# Patient Record
Sex: Female | Born: 1950 | Race: Black or African American | Hispanic: No | Marital: Single | Smoking: Former smoker
Health system: Southern US, Community
[De-identification: ages and names within clinical notes are randomized; demographics above are authoritative.]

## PROBLEM LIST (undated history)

## (undated) DIAGNOSIS — Z8719 Personal history of other diseases of the digestive system: Secondary | ICD-10-CM

## (undated) DIAGNOSIS — G4730 Sleep apnea, unspecified: Secondary | ICD-10-CM

## (undated) DIAGNOSIS — G43909 Migraine, unspecified, not intractable, without status migrainosus: Secondary | ICD-10-CM

## (undated) DIAGNOSIS — I2510 Atherosclerotic heart disease of native coronary artery without angina pectoris: Secondary | ICD-10-CM

## (undated) DIAGNOSIS — M1990 Unspecified osteoarthritis, unspecified site: Secondary | ICD-10-CM

## (undated) DIAGNOSIS — F419 Anxiety disorder, unspecified: Secondary | ICD-10-CM

## (undated) DIAGNOSIS — K635 Polyp of colon: Secondary | ICD-10-CM

## (undated) DIAGNOSIS — T7840XA Allergy, unspecified, initial encounter: Secondary | ICD-10-CM

## (undated) DIAGNOSIS — K219 Gastro-esophageal reflux disease without esophagitis: Secondary | ICD-10-CM

## (undated) DIAGNOSIS — M109 Gout, unspecified: Secondary | ICD-10-CM

## (undated) DIAGNOSIS — I776 Arteritis, unspecified: Secondary | ICD-10-CM

## (undated) DIAGNOSIS — E669 Obesity, unspecified: Secondary | ICD-10-CM

## (undated) DIAGNOSIS — Z9989 Dependence on other enabling machines and devices: Secondary | ICD-10-CM

## (undated) DIAGNOSIS — G4733 Obstructive sleep apnea (adult) (pediatric): Secondary | ICD-10-CM

## (undated) DIAGNOSIS — I10 Essential (primary) hypertension: Secondary | ICD-10-CM

## (undated) DIAGNOSIS — D649 Anemia, unspecified: Secondary | ICD-10-CM

## (undated) DIAGNOSIS — R7303 Prediabetes: Secondary | ICD-10-CM

## (undated) HISTORY — PX: APPENDECTOMY: SHX54

## (undated) HISTORY — PX: ABDOMINAL HYSTERECTOMY: SHX81

## (undated) HISTORY — PX: CARDIAC CATHETERIZATION: SHX172

## (undated) HISTORY — DX: Sleep apnea, unspecified: G47.30

## (undated) HISTORY — DX: Allergy, unspecified, initial encounter: T78.40XA

## (undated) HISTORY — DX: Polyp of colon: K63.5

## (undated) HISTORY — PX: TONSILLECTOMY: SUR1361

---

## 2003-04-27 DIAGNOSIS — F419 Anxiety disorder, unspecified: Secondary | ICD-10-CM | POA: Insufficient documentation

## 2008-08-22 DIAGNOSIS — D2110 Benign neoplasm of connective and other soft tissue of unspecified upper limb, including shoulder: Secondary | ICD-10-CM | POA: Insufficient documentation

## 2008-08-22 DIAGNOSIS — Z01810 Encounter for preprocedural cardiovascular examination: Secondary | ICD-10-CM

## 2008-08-22 HISTORY — DX: Encounter for preprocedural cardiovascular examination: Z01.810

## 2009-10-16 DIAGNOSIS — M1990 Unspecified osteoarthritis, unspecified site: Secondary | ICD-10-CM | POA: Insufficient documentation

## 2011-07-26 DIAGNOSIS — I10 Essential (primary) hypertension: Secondary | ICD-10-CM | POA: Insufficient documentation

## 2011-07-26 DIAGNOSIS — K2100 Gastro-esophageal reflux disease with esophagitis, without bleeding: Secondary | ICD-10-CM | POA: Insufficient documentation

## 2013-04-26 HISTORY — PX: CARDIAC CATHETERIZATION: SHX172

## 2016-05-05 ENCOUNTER — Observation Stay (HOSPITAL_COMMUNITY)
Admission: EM | Admit: 2016-05-05 | Discharge: 2016-05-07 | DRG: 342 | Disposition: A | Discharge status: Home / Self Care | Payer: BLUE CROSS/BLUE SHIELD | Attending: Surgery | Admitting: Surgery

## 2016-05-05 ENCOUNTER — Encounter (HOSPITAL_COMMUNITY): Payer: Self-pay | Admitting: *Deleted

## 2016-05-05 DIAGNOSIS — K3580 Unspecified acute appendicitis: Secondary | ICD-10-CM

## 2016-05-05 DIAGNOSIS — Z882 Allergy status to sulfonamides status: Secondary | ICD-10-CM | POA: Diagnosis not present

## 2016-05-05 DIAGNOSIS — K352 Acute appendicitis with generalized peritonitis: Principal | ICD-10-CM | POA: Insufficient documentation

## 2016-05-05 DIAGNOSIS — Z791 Long term (current) use of non-steroidal anti-inflammatories (NSAID): Secondary | ICD-10-CM | POA: Insufficient documentation

## 2016-05-05 DIAGNOSIS — R011 Cardiac murmur, unspecified: Secondary | ICD-10-CM | POA: Diagnosis not present

## 2016-05-05 DIAGNOSIS — Z6841 Body Mass Index (BMI) 40.0 and over, adult: Secondary | ICD-10-CM | POA: Diagnosis not present

## 2016-05-05 DIAGNOSIS — I10 Essential (primary) hypertension: Secondary | ICD-10-CM | POA: Insufficient documentation

## 2016-05-05 DIAGNOSIS — M109 Gout, unspecified: Secondary | ICD-10-CM | POA: Diagnosis not present

## 2016-05-05 DIAGNOSIS — Z79899 Other long term (current) drug therapy: Secondary | ICD-10-CM | POA: Diagnosis not present

## 2016-05-05 DIAGNOSIS — R109 Unspecified abdominal pain: Secondary | ICD-10-CM | POA: Diagnosis present

## 2016-05-05 DIAGNOSIS — K219 Gastro-esophageal reflux disease without esophagitis: Secondary | ICD-10-CM | POA: Insufficient documentation

## 2016-05-05 DIAGNOSIS — Z90710 Acquired absence of both cervix and uterus: Secondary | ICD-10-CM | POA: Diagnosis not present

## 2016-05-05 HISTORY — DX: Obesity, unspecified: E66.9

## 2016-05-05 HISTORY — DX: Gastro-esophageal reflux disease without esophagitis: K21.9

## 2016-05-05 HISTORY — DX: Personal history of other diseases of the digestive system: Z87.19

## 2016-05-05 HISTORY — DX: Obstructive sleep apnea (adult) (pediatric): G47.33

## 2016-05-05 HISTORY — DX: Gout, unspecified: M10.9

## 2016-05-05 HISTORY — DX: Migraine, unspecified, not intractable, without status migrainosus: G43.909

## 2016-05-05 HISTORY — DX: Essential (primary) hypertension: I10

## 2016-05-05 HISTORY — DX: Anxiety disorder, unspecified: F41.9

## 2016-05-05 HISTORY — DX: Unspecified osteoarthritis, unspecified site: M19.90

## 2016-05-05 HISTORY — DX: Dependence on other enabling machines and devices: Z99.89

## 2016-05-05 LAB — CBC
HCT: 34.7 % — ABNORMAL LOW (ref 36.0–46.0)
HEMOGLOBIN: 11.3 g/dL — AB (ref 12.0–15.0)
MCH: 27.4 pg (ref 26.0–34.0)
MCHC: 32.6 g/dL (ref 30.0–36.0)
MCV: 84.2 fL (ref 78.0–100.0)
Platelets: 298 10*3/uL (ref 150–400)
RBC: 4.12 MIL/uL (ref 3.87–5.11)
RDW: 15.4 % (ref 11.5–15.5)
WBC: 10.5 10*3/uL (ref 4.0–10.5)

## 2016-05-05 LAB — COMPREHENSIVE METABOLIC PANEL
ALBUMIN: 4 g/dL (ref 3.5–5.0)
ALK PHOS: 81 U/L (ref 38–126)
ALT: 18 U/L (ref 14–54)
ANION GAP: 11 (ref 5–15)
AST: 21 U/L (ref 15–41)
BUN: 22 mg/dL — ABNORMAL HIGH (ref 6–20)
CALCIUM: 10.1 mg/dL (ref 8.9–10.3)
CO2: 23 mmol/L (ref 22–32)
Chloride: 104 mmol/L (ref 101–111)
Creatinine, Ser: 0.87 mg/dL (ref 0.44–1.00)
GFR calc Af Amer: >60 mL/min (ref >60)
GFR calc non Af Amer: >60 mL/min (ref >60)
GLUCOSE: 100 mg/dL — AB (ref 65–99)
Potassium: 4 mmol/L (ref 3.5–5.1)
SODIUM: 138 mmol/L (ref 135–145)
Total Bilirubin: 0.5 mg/dL (ref 0.3–1.2)
Total Protein: 8.4 g/dL — ABNORMAL HIGH (ref 6.5–8.1)

## 2016-05-05 LAB — LIPASE, BLOOD: Lipase: 16 U/L (ref 11–51)

## 2016-05-05 LAB — URINALYSIS, ROUTINE W REFLEX MICROSCOPIC
BILIRUBIN URINE: NEGATIVE
Glucose, UA: NEGATIVE mg/dL
HGB URINE DIPSTICK: NEGATIVE
Ketones, ur: NEGATIVE mg/dL
Leukocytes, UA: NEGATIVE
Nitrite: NEGATIVE
PH: 6 (ref 5.0–8.0)
Protein, ur: NEGATIVE mg/dL
SPECIFIC GRAVITY, URINE: 1.015 (ref 1.005–1.030)

## 2016-05-05 MED ORDER — IOPAMIDOL (ISOVUE-300) INJECTION 61%
INTRAVENOUS | Status: AC
  Administered 2016-05-06: 100 mL
  Filled 2016-05-05: qty 100

## 2016-05-05 MED ORDER — FENTANYL CITRATE (PF) 100 MCG/2ML IJ SOLN
50.0000 ug | INTRAMUSCULAR | Status: DC | PRN
  Administered 2016-05-06: 50 ug via INTRAVENOUS
  Filled 2016-05-05: qty 2

## 2016-05-05 NOTE — ED Triage Notes (Signed)
Pt reports onset today of RLQ pain with nausea. No bm today and denies fever. Sent here to r/o appendicitis.

## 2016-05-05 NOTE — ED Provider Notes (Signed)
[facility] DEPT Provider Note   CSN: [id number] Arrival date & time: 05/05/16  1437     History   Chief Complaint Chief Complaint  Patient presents with  . Abdominal Pain    HPI Yaqueline Kurihara is a 66 y.o. female.  The history is provided by the patient.  Abdominal Pain   This is a new problem. The current episode started 6 to 12 hours ago (Sudden onset this morning). The problem occurs constantly. Progression since onset: Has migrated from both sides of the abdomen to mostly the right lower and upper quadrants. The pain is associated with an unknown factor. The pain is located in the RUQ, LUQ and LLQ. The quality of the pain is aching. The pain is moderate. Pertinent negatives include anorexia, fever, hematochezia, melena, vomiting and hematuria. Nothing aggravates the symptoms. Nothing relieves the symptoms.    Past Medical History:  Diagnosis Date  . GERD (gastroesophageal reflux disease)   . Gout   . Hypertension   . Obesity     There are no active problems to display for this patient.   Past Surgical History:  Procedure Laterality Date  . ABDOMINAL HYSTERECTOMY      OB History    No data available       Home Medications    Prior to Admission medications   Not on File    Family History History reviewed. No pertinent family history.  Social History Social History  Substance Use Topics  . Smoking status: Never Smoker  . Smokeless tobacco: Not on file  . Alcohol use No     Allergies   Sulfa antibiotics   Review of Systems Review of Systems  Constitutional: Negative for fever.  Gastrointestinal: Positive for abdominal pain. Negative for anorexia, hematochezia, melena and vomiting.  Genitourinary: Negative for hematuria.  All other systems reviewed and are negative.    Physical Exam Updated Vital Signs BP 145/68 (BP Location: Left Arm)   Pulse 91   Temp 98.8 F (37.1 C) (Oral)   Resp 16   Ht 5\' 1"  (1.549 m)   Wt 297 lb (134.7 kg)    SpO2 100%   BMI 56.12 kg/m   Physical Exam  Constitutional: She is oriented to person, place, and time. She appears well-developed and well-nourished. No distress.  HENT:  Head: Normocephalic.  Nose: Nose normal.  Eyes: Conjunctivae are normal.  Neck: Neck supple. No tracheal deviation present.  Cardiovascular: Normal rate, regular rhythm and normal heart sounds.   Pulmonary/Chest: Effort normal and breath sounds normal. No respiratory distress.  Abdominal: Soft. She exhibits no distension. There is tenderness (mild RUQ and RLQ). There is guarding (voluntary, right side ).  Obese abdomen  Neurological: She is alert and oriented to person, place, and time.  Skin: Skin is warm and dry. No rash noted.  Psychiatric: She has a normal mood and affect.  Vitals reviewed.    ED Treatments / Results  Labs (all labs ordered are listed, but only abnormal results are displayed) Labs Reviewed  COMPREHENSIVE METABOLIC PANEL - Abnormal; Notable for the following:       Result Value   Glucose, Bld 100 (*)    BUN 22 (*)    Total Protein 8.4 (*)    All other components within normal limits  CBC - Abnormal; Notable for the following:    Hemoglobin 11.3 (*)    HCT 34.7 (*)    All other components within normal limits  LIPASE, BLOOD  URINALYSIS, ROUTINE W  REFLEX MICROSCOPIC    EKG  EKG Interpretation None       Radiology No results found.  Procedures Procedures (including critical care time)  Medications Ordered in ED Medications  fentaNYL (SUBLIMAZE) injection 50 mcg (not administered)     Initial Impression / Assessment and Plan / ED Course  I have reviewed the triage vital signs and the nursing notes.  Pertinent labs & imaging results that were available during my care of the patient were reviewed by me and considered in my medical decision making (see chart for details).  Clinical Course     66 y.o. female presents with Bilateral lower abdominal pain that has  migrated into her right upper quadrant and continues on her right flank. No vomiting, no fever, no diarrhea, has not have bowel movement today and bowel movements yesterday and prior were normal. Labs reassuring, exam concerning for very large body habitus and difficult exam, diffuse right-sided tenderness that could be due to early appendicitis or other surgical pathology. I have a low suspicion clinically but feel that CT is warranted for adequate rule out given the limitations of her exam.   Care transferred to Dr Venora Maples at midnight pending results of study to r/o emergent pathology, recommended NSAIDs and tylenol PRN at home if unremarkable or will disposition accordingly if concerning findings on scan.   Final Clinical Impressions(s) / ED Diagnoses   Final diagnoses:  Right sided abdominal pain    New Prescriptions New Prescriptions   No medications on file     Leo Grosser, MD 05/05/16 2341

## 2016-05-06 ENCOUNTER — Encounter (HOSPITAL_COMMUNITY)
Admission: EM | Disposition: A | Discharge status: Home / Self Care | Payer: Self-pay | Source: Home / Self Care | Attending: Emergency Medicine

## 2016-05-06 ENCOUNTER — Inpatient Hospital Stay (HOSPITAL_COMMUNITY): Payer: BLUE CROSS/BLUE SHIELD | Admitting: Anesthesiology

## 2016-05-06 ENCOUNTER — Encounter (HOSPITAL_COMMUNITY): Payer: Self-pay | Admitting: Radiology

## 2016-05-06 ENCOUNTER — Emergency Department (HOSPITAL_COMMUNITY): Payer: BLUE CROSS/BLUE SHIELD

## 2016-05-06 DIAGNOSIS — K352 Acute appendicitis with generalized peritonitis: Secondary | ICD-10-CM | POA: Diagnosis not present

## 2016-05-06 DIAGNOSIS — Z6841 Body Mass Index (BMI) 40.0 and over, adult: Secondary | ICD-10-CM | POA: Diagnosis not present

## 2016-05-06 DIAGNOSIS — I10 Essential (primary) hypertension: Secondary | ICD-10-CM | POA: Diagnosis not present

## 2016-05-06 DIAGNOSIS — K3580 Unspecified acute appendicitis: Secondary | ICD-10-CM | POA: Diagnosis present

## 2016-05-06 HISTORY — PX: LAPAROSCOPIC APPENDECTOMY: SHX408

## 2016-05-06 LAB — CREATININE, SERUM
CREATININE: 1.02 mg/dL — AB (ref 0.44–1.00)
GFR calc Af Amer: >60 mL/min (ref >60)
GFR calc non Af Amer: 57 mL/min — ABNORMAL LOW (ref >60)

## 2016-05-06 LAB — CBC
HCT: 32.1 % — ABNORMAL LOW (ref 36.0–46.0)
Hemoglobin: 10.2 g/dL — ABNORMAL LOW (ref 12.0–15.0)
MCH: 26.9 pg (ref 26.0–34.0)
MCHC: 31.8 g/dL (ref 30.0–36.0)
MCV: 84.7 fL (ref 78.0–100.0)
PLATELETS: 267 10*3/uL (ref 150–400)
RBC: 3.79 MIL/uL — AB (ref 3.87–5.11)
RDW: 15.8 % — AB (ref 11.5–15.5)
WBC: 10.1 10*3/uL (ref 4.0–10.5)

## 2016-05-06 SURGERY — APPENDECTOMY, LAPAROSCOPIC
Anesthesia: General | Site: Abdomen

## 2016-05-06 MED ORDER — ALLOPURINOL 100 MG PO TABS
100.0000 mg | ORAL_TABLET | Freq: Every day | ORAL | Status: DC
  Administered 2016-05-06 – 2016-05-07 (×2): 100 mg via ORAL
  Filled 2016-05-06 (×2): qty 1

## 2016-05-06 MED ORDER — FENTANYL CITRATE (PF) 100 MCG/2ML IJ SOLN
INTRAMUSCULAR | Status: AC
  Filled 2016-05-06: qty 2

## 2016-05-06 MED ORDER — DIPHENHYDRAMINE HCL 50 MG/ML IJ SOLN
12.5000 mg | Freq: Four times a day (QID) | INTRAMUSCULAR | Status: DC | PRN
  Administered 2016-05-07: 12.5 mg via INTRAVENOUS
  Filled 2016-05-06: qty 1

## 2016-05-06 MED ORDER — MIDAZOLAM HCL 2 MG/2ML IJ SOLN
INTRAMUSCULAR | Status: AC
  Filled 2016-05-06: qty 2

## 2016-05-06 MED ORDER — ROCURONIUM BROMIDE 100 MG/10ML IV SOLN
INTRAVENOUS | Status: DC | PRN
Start: 2016-05-06 — End: 2016-05-06
  Administered 2016-05-06: 50 mg via INTRAVENOUS

## 2016-05-06 MED ORDER — DIPHENHYDRAMINE HCL 50 MG/ML IJ SOLN: 12.5000 mg | Freq: Four times a day (QID) | INTRAMUSCULAR | Status: DC | PRN

## 2016-05-06 MED ORDER — BUPIVACAINE HCL (PF) 0.25 % IJ SOLN
INTRAMUSCULAR | Status: AC
  Filled 2016-05-06: qty 30

## 2016-05-06 MED ORDER — SODIUM CHLORIDE 0.9 % IR SOLN
Status: DC | PRN
  Administered 2016-05-06: 1000 mL

## 2016-05-06 MED ORDER — ONDANSETRON HCL 4 MG/2ML IJ SOLN
4.0000 mg | Freq: Four times a day (QID) | INTRAMUSCULAR | Status: DC | PRN
Start: 2016-05-06 — End: 2016-05-07

## 2016-05-06 MED ORDER — HYDRALAZINE HCL 20 MG/ML IJ SOLN: 10.0000 mg | INTRAMUSCULAR | Status: DC | PRN

## 2016-05-06 MED ORDER — DEXTROSE 5 % IV SOLN
1.0000 g | Freq: Once | INTRAVENOUS | Status: AC
  Administered 2016-05-06: 1 g via INTRAVENOUS
  Filled 2016-05-06: qty 10

## 2016-05-06 MED ORDER — FENTANYL CITRATE (PF) 100 MCG/2ML IJ SOLN
INTRAMUSCULAR | Status: DC | PRN
  Administered 2016-05-06: 100 ug via INTRAVENOUS
  Administered 2016-05-06 (×2): 50 ug via INTRAVENOUS

## 2016-05-06 MED ORDER — ONDANSETRON HCL 4 MG/2ML IJ SOLN
4.0000 mg | Freq: Three times a day (TID) | INTRAMUSCULAR | Status: DC | PRN
  Administered 2016-05-06: 4 mg via INTRAVENOUS
  Filled 2016-05-06: qty 2

## 2016-05-06 MED ORDER — DULOXETINE HCL 30 MG PO CPEP
30.0000 mg | ORAL_CAPSULE | Freq: Every day | ORAL | Status: DC
  Administered 2016-05-06 – 2016-05-07 (×2): 30 mg via ORAL
  Filled 2016-05-06 (×2): qty 1

## 2016-05-06 MED ORDER — OXYCODONE HCL 5 MG PO TABS: 5.0000 mg | ORAL_TABLET | ORAL | Status: DC | PRN

## 2016-05-06 MED ORDER — HYDROMORPHONE HCL 2 MG/ML IJ SOLN
0.5000 mg | INTRAMUSCULAR | Status: DC | PRN
  Administered 2016-05-06: 0.5 mg via INTRAVENOUS
  Filled 2016-05-06: qty 1

## 2016-05-06 MED ORDER — HYDROMORPHONE HCL 1 MG/ML IJ SOLN: 0.2500 mg | INTRAMUSCULAR | Status: DC | PRN

## 2016-05-06 MED ORDER — ACETAMINOPHEN 325 MG PO TABS: 650.0000 mg | ORAL_TABLET | Freq: Four times a day (QID) | ORAL | Status: DC | PRN

## 2016-05-06 MED ORDER — METOPROLOL TARTRATE 50 MG PO TABS
50.0000 mg | ORAL_TABLET | Freq: Every day | ORAL | Status: DC
  Administered 2016-05-06 – 2016-05-07 (×2): 50 mg via ORAL
  Filled 2016-05-06: qty 1
  Filled 2016-05-06: qty 2

## 2016-05-06 MED ORDER — TRIAMTERENE-HCTZ 75-50 MG PO TABS
1.0000 | ORAL_TABLET | Freq: Every day | ORAL | Status: DC
  Administered 2016-05-06 – 2016-05-07 (×2): 1 via ORAL
  Filled 2016-05-06 (×2): qty 1

## 2016-05-06 MED ORDER — SUGAMMADEX SODIUM 500 MG/5ML IV SOLN
INTRAVENOUS | Status: DC | PRN
  Administered 2016-05-06: 500 mg via INTRAVENOUS

## 2016-05-06 MED ORDER — SODIUM CHLORIDE 0.9 % IV SOLN
3.0000 g | Freq: Once | INTRAVENOUS | Status: AC
  Administered 2016-05-06: 3 g via INTRAVENOUS
  Filled 2016-05-06: qty 3

## 2016-05-06 MED ORDER — DIPHENHYDRAMINE HCL 12.5 MG/5ML PO ELIX: 12.5000 mg | ORAL_SOLUTION | Freq: Four times a day (QID) | ORAL | Status: DC | PRN

## 2016-05-06 MED ORDER — PROPOFOL 10 MG/ML IV BOLUS
INTRAVENOUS | Status: AC
  Filled 2016-05-06: qty 20

## 2016-05-06 MED ORDER — ENOXAPARIN SODIUM 40 MG/0.4ML ~~LOC~~ SOLN
40.0000 mg | SUBCUTANEOUS | Status: DC
  Administered 2016-05-07: 40 mg via SUBCUTANEOUS
  Filled 2016-05-06: qty 0.4

## 2016-05-06 MED ORDER — SIMETHICONE 80 MG PO CHEW: 40.0000 mg | CHEWABLE_TABLET | Freq: Four times a day (QID) | ORAL | Status: DC | PRN

## 2016-05-06 MED ORDER — ONDANSETRON HCL 4 MG/2ML IJ SOLN: 4.0000 mg | Freq: Four times a day (QID) | INTRAMUSCULAR | Status: DC | PRN

## 2016-05-06 MED ORDER — LACTATED RINGERS IV SOLN
INTRAVENOUS | Status: DC
  Administered 2016-05-06 (×2): via INTRAVENOUS

## 2016-05-06 MED ORDER — SUCCINYLCHOLINE CHLORIDE 20 MG/ML IJ SOLN
INTRAMUSCULAR | Status: DC | PRN
  Administered 2016-05-06: 100 mg via INTRAVENOUS

## 2016-05-06 MED ORDER — METRONIDAZOLE 500 MG PO TABS
500.0000 mg | ORAL_TABLET | Freq: Three times a day (TID) | ORAL | Status: DC
Start: 2016-05-06 — End: 2016-05-07
  Administered 2016-05-06 – 2016-05-07 (×2): 500 mg via ORAL
  Filled 2016-05-06 (×3): qty 1

## 2016-05-06 MED ORDER — HYDROMORPHONE HCL 2 MG/ML IJ SOLN: 1.0000 mg | INTRAMUSCULAR | Status: DC | PRN

## 2016-05-06 MED ORDER — PANTOPRAZOLE SODIUM 40 MG PO TBEC
40.0000 mg | DELAYED_RELEASE_TABLET | Freq: Every day | ORAL | Status: DC
  Administered 2016-05-06 – 2016-05-07 (×2): 40 mg via ORAL
  Filled 2016-05-06 (×2): qty 1

## 2016-05-06 MED ORDER — DEXTROSE 5 % IV SOLN
2.0000 g | INTRAVENOUS | Status: DC
  Administered 2016-05-06: 2 g via INTRAVENOUS
  Filled 2016-05-06 (×2): qty 2

## 2016-05-06 MED ORDER — 0.9 % SODIUM CHLORIDE (POUR BTL) OPTIME
TOPICAL | Status: DC | PRN
  Administered 2016-05-06: 1000 mL

## 2016-05-06 MED ORDER — ACETAMINOPHEN 325 MG PO TABS
650.0000 mg | ORAL_TABLET | Freq: Four times a day (QID) | ORAL | Status: DC | PRN
Start: 2016-05-06 — End: 2016-05-07
  Administered 2016-05-06 – 2016-05-07 (×2): 650 mg via ORAL
  Filled 2016-05-06 (×2): qty 2

## 2016-05-06 MED ORDER — ONDANSETRON HCL 4 MG/2ML IJ SOLN
INTRAMUSCULAR | Status: DC | PRN
  Administered 2016-05-06: 4 mg via INTRAVENOUS

## 2016-05-06 MED ORDER — ONDANSETRON 4 MG PO TBDP: 4.0000 mg | ORAL_TABLET | Freq: Four times a day (QID) | ORAL | Status: DC | PRN

## 2016-05-06 MED ORDER — SODIUM CHLORIDE 0.9 % IV SOLN
INTRAVENOUS | Status: DC
  Administered 2016-05-07: 03:00:00 via INTRAVENOUS

## 2016-05-06 MED ORDER — ENOXAPARIN SODIUM 40 MG/0.4ML ~~LOC~~ SOLN: 40.0000 mg | SUBCUTANEOUS | Status: DC

## 2016-05-06 MED ORDER — PROMETHAZINE HCL 25 MG/ML IJ SOLN: 6.2500 mg | INTRAMUSCULAR | Status: DC | PRN

## 2016-05-06 MED ORDER — BUPIVACAINE HCL 0.25 % IJ SOLN
INTRAMUSCULAR | Status: DC | PRN
  Administered 2016-05-06: 11 mL

## 2016-05-06 MED ORDER — ACETAMINOPHEN 650 MG RE SUPP: 650.0000 mg | Freq: Four times a day (QID) | RECTAL | Status: DC | PRN

## 2016-05-06 MED ORDER — LIDOCAINE HCL (CARDIAC) 20 MG/ML IV SOLN
INTRAVENOUS | Status: DC | PRN
  Administered 2016-05-06: 100 mg via INTRAVENOUS

## 2016-05-06 MED ORDER — PIPERACILLIN-TAZOBACTAM 3.375 G IVPB: 3.3750 g | Freq: Three times a day (TID) | INTRAVENOUS | Status: DC

## 2016-05-06 MED ORDER — OXYCODONE HCL 5 MG PO TABS
5.0000 mg | ORAL_TABLET | ORAL | Status: DC | PRN
  Administered 2016-05-07: 5 mg via ORAL
  Filled 2016-05-06: qty 1

## 2016-05-06 MED ORDER — MORPHINE SULFATE (PF) 2 MG/ML IV SOLN: 2.0000 mg | INTRAVENOUS | Status: DC | PRN

## 2016-05-06 MED ORDER — MIDAZOLAM HCL 5 MG/5ML IJ SOLN
INTRAMUSCULAR | Status: DC | PRN
Start: 2016-05-06 — End: 2016-05-06
  Administered 2016-05-06: 2 mg via INTRAVENOUS

## 2016-05-06 MED ORDER — PROPOFOL 10 MG/ML IV BOLUS
INTRAVENOUS | Status: DC | PRN
  Administered 2016-05-06: 160 mg via INTRAVENOUS

## 2016-05-06 SURGICAL SUPPLY — 44 items
APPLIER CLIP ROT 10 11.4 M/L (STAPLE)
BENZOIN TINCTURE PRP APPL 2/3 (GAUZE/BANDAGES/DRESSINGS) ×2 IMPLANT
BLADE SURG ROTATE 9660 (MISCELLANEOUS) IMPLANT
CANISTER SUCTION 2500CC (MISCELLANEOUS) ×2 IMPLANT
CHLORAPREP W/TINT 26ML (MISCELLANEOUS) ×2 IMPLANT
CLIP APPLIE ROT 10 11.4 M/L (STAPLE) IMPLANT
COVER SURGICAL LIGHT HANDLE (MISCELLANEOUS) ×2 IMPLANT
CUTTER FLEX LINEAR 45M (STAPLE) ×2 IMPLANT
DRSG TEGADERM 2-3/8X2-3/4 SM (GAUZE/BANDAGES/DRESSINGS) ×4 IMPLANT
DRSG TEGADERM 4X4.75 (GAUZE/BANDAGES/DRESSINGS) ×2 IMPLANT
ELECT REM PT RETURN 9FT ADLT (ELECTROSURGICAL) ×2
ELECTRODE REM PT RTRN 9FT ADLT (ELECTROSURGICAL) ×1 IMPLANT
ENDOLOOP SUT PDS II  0 18 (SUTURE)
ENDOLOOP SUT PDS II 0 18 (SUTURE) IMPLANT
FILTER SMOKE EVAC LAPAROSHD (FILTER) ×2 IMPLANT
GAUZE SPONGE 2X2 8PLY STRL LF (GAUZE/BANDAGES/DRESSINGS) ×1 IMPLANT
GLOVE BIO SURGEON STRL SZ7 (GLOVE) ×4 IMPLANT
GLOVE BIOGEL PI IND STRL 6.5 (GLOVE) ×1 IMPLANT
GLOVE BIOGEL PI IND STRL 7.0 (GLOVE) ×3 IMPLANT
GLOVE BIOGEL PI IND STRL 7.5 (GLOVE) ×1 IMPLANT
GLOVE BIOGEL PI INDICATOR 6.5 (GLOVE) ×1
GLOVE BIOGEL PI INDICATOR 7.0 (GLOVE) ×3
GLOVE BIOGEL PI INDICATOR 7.5 (GLOVE) ×1
GLOVE ECLIPSE 7.0 STRL STRAW (GLOVE) ×2 IMPLANT
GOWN STRL REUS W/ TWL LRG LVL3 (GOWN DISPOSABLE) ×3 IMPLANT
GOWN STRL REUS W/TWL LRG LVL3 (GOWN DISPOSABLE) ×3
KIT BASIN OR (CUSTOM PROCEDURE TRAY) ×2 IMPLANT
KIT ROOM TURNOVER OR (KITS) ×2 IMPLANT
NS IRRIG 1000ML POUR BTL (IV SOLUTION) ×2 IMPLANT
PAD ARMBOARD 7.5X6 YLW CONV (MISCELLANEOUS) ×4 IMPLANT
POUCH SPECIMEN RETRIEVAL 10MM (ENDOMECHANICALS) ×2 IMPLANT
RELOAD STAPLE TA45 3.5 REG BLU (ENDOMECHANICALS) ×2 IMPLANT
SCALPEL HARMONIC ACE (MISCELLANEOUS) ×2 IMPLANT
SET IRRIG TUBING LAPAROSCOPIC (IRRIGATION / IRRIGATOR) ×2 IMPLANT
SPECIMEN JAR SMALL (MISCELLANEOUS) ×2 IMPLANT
SPONGE GAUZE 2X2 STER 10/PKG (GAUZE/BANDAGES/DRESSINGS) ×1
STRIP CLOSURE SKIN 1/2X4 (GAUZE/BANDAGES/DRESSINGS) ×2 IMPLANT
SUT MNCRL AB 4-0 PS2 18 (SUTURE) ×2 IMPLANT
TOWEL OR 17X24 6PK STRL BLUE (TOWEL DISPOSABLE) ×2 IMPLANT
TOWEL OR 17X26 10 PK STRL BLUE (TOWEL DISPOSABLE) IMPLANT
TRAY LAPAROSCOPIC MC (CUSTOM PROCEDURE TRAY) ×2 IMPLANT
TROCAR XCEL BLADELESS 5X75MML (TROCAR) ×4 IMPLANT
TROCAR XCEL BLUNT TIP 100MML (ENDOMECHANICALS) ×2 IMPLANT
TUBING INSUFFLATION (TUBING) ×2 IMPLANT

## 2016-05-06 NOTE — Progress Notes (Signed)
Coretha Melerine is a 66 y.o. female patient admitted from PACU awake, alert - oriented  X 4 - no acute distress noted.  VSS - Blood pressure 126/70, pulse 79, temperature 97.7 F (36.5 C), temperature source Oral, resp. rate 18, height 5\' 1"  (1.549 m), weight 134.7 kg (297 lb), SpO2 97 %.    IV in place, occlusive dsg intact without redness.  Orientation to room, and floor completed with information packet given to patient/family.  Patient declined safety video at this time.  Admission INP armband ID verified with patient/family, and in place.   SR up x 2, fall assessment complete, with patient and family able to verbalize understanding of risk associated with falls, and verbalized understanding to call nsg before up out of bed.  Call light within reach, patient able to voice, and demonstrate understanding.    3 lap sites with dressing clean dry and intact. Patient tolerating clear liquids, was advanced to fulls and tolerating full liquid at this time as well.     Will cont to eval and treat per MD orders.  Fredric Mare, RN 05/06/2016 2:58 PM

## 2016-05-06 NOTE — ED Provider Notes (Signed)
Temp orders placed  D/w General Surgery: Dr Kieth Brightly  Rocephin and Unasyn given (plan for flagyl but critically low supplies)  NPO  Pt updated   Jola Schmidt, MD 05/06/16 0131

## 2016-05-06 NOTE — Op Note (Signed)
Appendectomy, Lap, Procedure Note  Indications: The patient presented with a history of right-sided abdominal pain. A CT scan revealed findings consistent with acute appendicitis.  Pre-operative Diagnosis: Acute appendicitis without mention of peritonitis  Post-operative Diagnosis: Same  Surgeon: Roosevelt Bisher K.   Assistants: none  Anesthesia: General endotracheal anesthesia  ASA Class: 2E  Procedure Details  The patient was seen again in the Holding Room. The risks, benefits, complications, treatment options, and expected outcomes were discussed with the patient and/or family. The possibilities of reaction to medication, perforation of viscus, bleeding, recurrent infection, finding a normal appendix, the need for additional procedures, failure to diagnose a condition, and creating a complication requiring transfusion or operation were discussed. There was concurrence with the proposed plan and informed consent was obtained. The site of surgery was properly noted. The patient was taken to Operating Room, identified as Christine [name] and the procedure verified as Appendectomy. A Time Out was held and the above information confirmed.  The patient was placed in the supine position and general anesthesia was induced.  The abdomen was prepped and draped in a sterile fashion. A one centimeter supraumbilical incision was made.  Dissection was carried down to the fascia bluntly.  The fascia was incised vertically.  We entered the peritoneal cavity bluntly.  A pursestring suture was passed around the incision with a 0 Vicryl.  The Hasson cannula was introduced into the abdomen and the tails of the suture were used to hold the Hasson in place.   The pneumoperitoneum was then established maintaining a maximum pressure of 15 mmHg.  Additional 5 mm cannulas then placed in the left lower quadrant of the abdomen and the right upper quadrant under direct visualization. A careful evaluation of the entire  abdomen was carried out. The patient was placed in Trendelenburg and left lateral decubitus position.  The scope was moved to the right upper quadrant port site. The cecum was mobilized medially.  The appendix is quite inflamed with some adherent adjacent bowel and fat.  We bluntly dissected this away to reveal the base of the appendix, which was intact. The appendix was carefully dissected. The appendix was was skeletonized with the harmonic scalpel.   The appendix was divided at its base using an endo-GIA stapler. There was no evidence of bleeding, leakage, or complication after division of the appendix. Irrigation was also performed and irrigate suctioned from the abdomen as well.  The umbilical port site was closed with the purse string suture. There was no residual palpable fascial defect.  The trocar site skin wounds were closed with 4-0 Monocryl.  Instrument, sponge, and needle counts were correct at the conclusion of the case.   Findings: The appendix was found to be inflamed. There were not signs of necrosis.  There was not perforation. There was not abscess formation.  Estimated Blood Loss:  Minimal         Drains: none         Specimens: appendix         Complications:  None; patient tolerated the procedure well.         Disposition: PACU - hemodynamically stable.         Condition: stable  Imogene Burn. Georgette Dover, MD, [facility]  General/ Trauma Surgery  05/06/2016 11:23 AM

## 2016-05-06 NOTE — Discharge Instructions (Addendum)
LAPAROSCOPIC SURGERY: POST OP INSTRUCTIONS  °1. DIET: Follow a light bland diet the first 24 hours after arrival home, such as soup, liquids, crackers, etc. Be sure to include lots of fluids daily. Avoid fast food or heavy meals as your are more likely to get nauseated. Eat a low fat the next few days after surgery.  °2. Take your usually prescribed home medications unless otherwise directed. °3. PAIN CONTROL:  °1. Pain is best controlled by a usual combination of three different methods TOGETHER:  °1. Ice/Heat °2. Over the counter pain medication °3. Prescription pain medication °2. Most patients will experience some swelling and bruising around the incisions. Ice packs or heating pads (30-60 minutes up to 6 times a day) will help. Use ice for the first few days to help decrease swelling and bruising, then switch to heat to help relax tight/sore spots and speed recovery. Some people prefer to use ice alone, heat alone, alternating between ice & heat. Experiment to what works for you. Swelling and bruising can take several weeks to resolve.  °3. It is helpful to take an over-the-counter pain medication regularly for the first few weeks. Choose one of the following that works best for you:  °1. Naproxen (Aleve, etc) Two 220mg tabs twice a day °2. Ibuprofen (Advil, etc) Three 200mg tabs four times a day (every meal & bedtime) °3. Acetaminophen (Tylenol, etc) 500-650mg four times a day (every meal & bedtime) °4. A prescription for pain medication (such as oxycodone, hydrocodone, etc) should be given to you upon discharge. Take your pain medication as prescribed.  °1. If you are having problems/concerns with the prescription medicine (does not control pain, nausea, vomiting, rash, itching, etc), please call us [phone] to see if we need to switch you to a different pain medicine that will work better for you and/or control your side effect better. °2. If you need a refill on your pain medication, please contact  your pharmacy. They will contact our office to request authorization. Prescriptions will not be filled after 5 pm or on week-ends. °4. Avoid getting constipated. Between the surgery and the pain medications, it is common to experience some constipation. Increasing fluid intake and taking a fiber supplement (such as Metamucil, Citrucel, FiberCon, MiraLax, etc) 1-2 times a day regularly will usually help prevent this problem from occurring. A mild laxative (prune juice, Milk of Magnesia, MiraLax, etc) should be taken according to package directions if there are no bowel movements after 48 hours.  °5. Watch out for diarrhea. If you have many loose bowel movements, simplify your diet to bland foods & liquids for a few days. Stop any stool softeners and decrease your fiber supplement. Switching to mild anti-diarrheal medications (Kayopectate, Pepto Bismol) can help. If this worsens or does not improve, please call us. °6. Wash / shower every day. You may shower over the dressings as they are waterproof. Continue to shower over incision(s) after the dressing is off. °7. Remove your waterproof bandages 5 days after surgery. You may leave the incision open to air. You may replace a dressing/Band-Aid to cover the incision for comfort if you wish.  °8. ACTIVITIES as tolerated:  °1. You may resume regular (light) daily activities beginning the next day--such as daily self-care, walking, climbing stairs--gradually increasing activities as tolerated. If you can walk 30 minutes without difficulty, it is safe to try more intense activity such as jogging, treadmill, bicycling, low-impact aerobics, swimming, etc. °2. Save the most intensive and strenuous activity for   last such as sit-ups, heavy lifting, contact sports, etc Refrain from any heavy lifting or straining until you are off narcotics for pain control.  3. DO NOT PUSH THROUGH PAIN. Let pain be your guide: If it hurts to do something, don't do it. Pain is your body warning  you to avoid that activity for another week until the pain goes down. 4. You may drive when you are no longer taking prescription pain medication, you can comfortably wear a seatbelt, and you can safely maneuver your car and apply brakes. 5. You may have sexual intercourse when it is comfortable.  9. FOLLOW UP in our office  1. Please call [facility] at [phone] to set up an appointment to see your surgeon in the office for a follow-up appointment approximately 2-3 weeks after your surgery. 2. Make sure that you call for this appointment the day you arrive home to insure a convenient appointment time.      10. IF YOU HAVE DISABILITY OR FAMILY LEAVE FORMS, BRING THEM TO THE               OFFICE FOR PROCESSING.   WHEN TO CALL us [phone]:  1. Poor pain control 2. Reactions / problems with new medications (rash/itching, nausea, etc)  3. Fever over 101.5 F (38.5 C) 4. Inability to urinate 5. Nausea and/or vomiting 6. Worsening swelling or bruising 7. Continued bleeding from incision. 8. Increased pain, redness, or drainage from the incision  The clinic staff is available to answer your questions during regular business hours (8:30am-5pm). Please dont hesitate to call and ask to speak to one of our nurses for clinical concerns.  If you have a medical emergency, go to the nearest emergency room or call 911.  A surgeon from [facility] is always on call at the [facility], [place], [place], [place], [place] [address] ?  MAIN: [phone] ? TOLL FREE: [phone] ?  FAX [phone]  [email or web address]    Laparoscopic Appendectomy, Adult, Care After Introduction These instructions give you information about caring for yourself after your procedure. Your doctor may also give you more specific instructions. Call your doctor if you have any problems or questions after your procedure. Follow these instructions at  home: Medicines  Take over-the-counter and prescription medicines only as told by your doctor.  Do not drive for 24 hours if you received a sedative.  Do not drive or use heavy machinery while taking prescription pain medicine.  If you were prescribed an antibiotic medicine, take it as told by your doctor. Do not stop taking it even if you start to feel better. Activity  Do not lift anything that is heavier than 10 pounds (4.5 kg) for 3 weeks or as told by your doctor.  Do not play contact sports for 3 weeks or as told by your doctor.  Slowly return to your normal activities. Bathing  Keep your cuts from surgery (incisions) clean and dry.  Gently wash the cuts with soap and water.  Rinse the cuts with water until the soap is gone.  Pat the cuts dry with a clean towel. Do not rub the cuts.  You may take showers after 48 hours.  Do not take baths, swim, or use a hot tub for 2 weeks or as told by your doctor. Cut Care  Follow instructions from your doctor about how to take care of your cuts. Make sure you:  [facility]  your hands with soap and water before you change your bandage (dressing). If you do not have soap and water, use hand sanitizer.  Change your bandage as told by your doctor.  Leave stitches (sutures), skin glue, or skin tape (adhesive) strips in place. They may need to stay in place for 2 weeks or longer. If tape strips get loose and curl up, you may trim the loose edges. Do not remove tape strips completely unless your doctor says it is okay.  Check your cuts every day for signs of infection. Check for:  More redness, swelling, or pain.  More fluid or blood.  Warmth.  Pus or a bad smell. Other Instructions  If you were sent home with a drain, follow instructions from your doctor about how to use it and care for it.  Take deep breaths. This helps to keep your lungs from getting swollen (inflamed).  To help with constipation:  Drink plenty of  fluids.  Eat plenty of fruits and vegetables.  Keep all follow-up visits as told by your doctor. This is important. Contact a doctor if:  You have more redness, swelling, or pain around a cut from surgery.  You have more fluid or blood coming from a cut.  Your cut feels warm to the touch.  You have pus or a bad smell coming from a cut or a bandage.  The edges of a cut break open after the stitches have been taken out.  You have pain in your shoulders that gets worse.  You feel dizzy or you pass out (faint).  You have shortness of breath.  You keep feeling sick to your stomach (nauseous).  You keep throwing up (vomiting).  You get diarrhea or you cannot control your poop.  You lose your appetite.  You have swelling or pain in your legs. Get help right away if:  You have a fever.  You get a rash.  You have trouble breathing.  You have sharp pains in your chest. This information is not intended to replace advice given to you by your health care provider. Make sure you discuss any questions you have with your health care provider. Document Released: 02/06/2009 Document Revised: 09/18/2015 Document Reviewed: 09/30/2014  2017 [employer]

## 2016-05-06 NOTE — H&P (Signed)
Christine [name] is an 66 y.o. female.   Chief Complaint: abdominal pain HPI: 66 yo female with 2 days of abdominal pain. It began as diffuse pain all over and now has localized to the right lower quadrant. She has not had pain like this, it does not radiate. She is nauseated but has not vomited. She has not had diarrhea or fevers at home. She has anorexia.  Past Medical History:  Diagnosis Date  . GERD (gastroesophageal reflux disease)   . Gout   . Hypertension   . Obesity     Past Surgical History:  Procedure Laterality Date  . ABDOMINAL HYSTERECTOMY      History reviewed. No pertinent family history. Social History:  reports that she has never smoked. She does not have any smokeless tobacco history on file. She reports that she does not drink alcohol or use drugs.  Allergies:  Allergies  Allergen Reactions  . Sulfa Antibiotics Rash     (Not in a hospital admission)  Results for orders placed or performed during the hospital encounter of 05/05/16 (from the past 48 hour(s))  Lipase, blood     Status: None   Collection Time: 05/05/16  3:28 PM  Result Value Ref Range   Lipase 16 11 - 51 U/L  Comprehensive metabolic panel     Status: Abnormal   Collection Time: 05/05/16  3:28 PM  Result Value Ref Range   Sodium 138 135 - 145 mmol/L   Potassium 4.0 3.5 - 5.1 mmol/L   Chloride 104 101 - 111 mmol/L   CO2 23 22 - 32 mmol/L   Glucose, Bld 100 (H) 65 - 99 mg/dL   BUN 22 (H) 6 - 20 mg/dL   Creatinine, Ser 0.87 0.44 - 1.00 mg/dL   Calcium 10.1 8.9 - 10.3 mg/dL   Total Protein 8.4 (H) 6.5 - 8.1 g/dL   Albumin 4.0 3.5 - 5.0 g/dL   AST 21 15 - 41 U/L   ALT 18 14 - 54 U/L   Alkaline Phosphatase 81 38 - 126 U/L   Total Bilirubin 0.5 0.3 - 1.2 mg/dL   GFR calc non Af Amer >60 >60 mL/min   GFR calc Af Amer >60 >60 mL/min    Comment: (NOTE) The eGFR has been calculated using the CKD EPI equation. This calculation has not been validated in all clinical situations. eGFR's  persistently <60 mL/min signify possible Chronic Kidney Disease.    Anion gap 11 5 - 15  CBC     Status: Abnormal   Collection Time: 05/05/16  3:28 PM  Result Value Ref Range   WBC 10.5 4.0 - 10.5 K/uL   RBC 4.12 3.87 - 5.11 MIL/uL   Hemoglobin 11.3 (L) 12.0 - 15.0 g/dL   HCT 34.7 (L) 36.0 - 46.0 %   MCV 84.2 78.0 - 100.0 fL   MCH 27.4 26.0 - 34.0 pg   MCHC 32.6 30.0 - 36.0 g/dL   RDW 15.4 11.5 - 15.5 %   Platelets 298 150 - 400 K/uL  Urinalysis, Routine w reflex microscopic     Status: None   Collection Time: 05/05/16  3:37 PM  Result Value Ref Range   Color, Urine YELLOW YELLOW   APPearance CLEAR CLEAR   Specific Gravity, Urine 1.015 1.005 - 1.030   pH 6.0 5.0 - 8.0   Glucose, UA NEGATIVE NEGATIVE mg/dL   Hgb urine dipstick NEGATIVE NEGATIVE   Bilirubin Urine NEGATIVE NEGATIVE   Ketones, ur NEGATIVE NEGATIVE mg/dL  Protein, ur NEGATIVE NEGATIVE mg/dL   Nitrite NEGATIVE NEGATIVE   Leukocytes, UA NEGATIVE NEGATIVE   Ct Abdomen Pelvis W Contrast  Result Date: 05/06/2016 CLINICAL DATA:  Abdominal pain and nausea all day yesterday. EXAM: CT ABDOMEN AND PELVIS WITH CONTRAST TECHNIQUE: Multidetector CT imaging of the abdomen and pelvis was performed using the standard protocol following bolus administration of intravenous contrast. CONTRAST:  100 ml ISOVUE-300 IOPAMIDOL (ISOVUE-300) INJECTION 61% COMPARISON:  None. FINDINGS: Lower chest: Linear atelectasis in the lung bases. Hepatobiliary: No focal liver abnormality is seen. No gallstones, gallbladder wall thickening, or biliary dilatation. Pancreas: Unremarkable. No pancreatic ductal dilatation or surrounding inflammatory changes. Spleen: Normal in size without focal abnormality. Adrenals/Urinary Tract: No adrenal gland nodules. Cyst in the lower pole left kidney measuring 5.8 cm diameter. No hydronephrosis or hydroureter. No bladder wall thickening or bladder stones. Stomach/Bowel: Motion artifact limits evaluation of the bowel.  Stomach, small bowel, and colon are mostly decompressed. The appendix is distended with diameter measured at 12 mm. There is infiltration in the periappendiceal fat. Changes are consistent with acute appendicitis. No abscess. Vascular/Lymphatic: No significant vascular findings are present. No enlarged abdominal or pelvic lymph nodes. Reproductive: Status post hysterectomy. No adnexal masses. Other: No abdominal wall hernia or abnormality. No abdominopelvic ascites. Musculoskeletal: Mild degenerative changes in the spine. No destructive bone lesions. IMPRESSION: Distended appendix with periappendiceal fat stranding consistent with acute appendicitis. No abscess. Electronically Signed   By: Lucienne Capers M.D.   On: 05/06/2016 00:37    Review of Systems  Constitutional: Negative for chills and fever.  HENT: Negative for ear discharge, ear pain, hearing loss and nosebleeds.   Eyes: Negative for blurred vision, double vision, photophobia and pain.  Respiratory: Negative for cough, hemoptysis, sputum production and shortness of breath.   Cardiovascular: Negative for chest pain and palpitations.  Gastrointestinal: Positive for abdominal pain and nausea. Negative for diarrhea and vomiting.  Genitourinary: Negative for dysuria and urgency.  Musculoskeletal: Negative for back pain, myalgias and neck pain.  Skin: Negative for itching and rash.  Neurological: Negative for dizziness, tingling and headaches.  Endo/Heme/Allergies: Does not bruise/bleed easily.  Psychiatric/Behavioral: Negative for depression and suicidal ideas.    Blood pressure 145/61, pulse 91, temperature 98.3 F (36.8 C), resp. rate 18, height 5' 1"  (1.549 m), weight 134.7 kg (297 lb), SpO2 99 %. Physical Exam  Vitals reviewed. Constitutional: She is oriented to person, place, and time. She appears well-developed and well-nourished. No distress.  HENT:  Head: Normocephalic and atraumatic. Not macrocephalic and not microcephalic.  Head is without raccoon's eyes, without Battle's sign, without abrasion, without contusion and without laceration. Hair is normal.  Right Ear: External ear normal. No drainage or swelling. No decreased hearing is noted.  Left Ear: External ear normal. No drainage or swelling. No decreased hearing is noted.  Nose: No mucosal edema, rhinorrhea, nose lacerations or sinus tenderness.  Eyes: Conjunctivae and EOM are normal. Pupils are equal, round, and reactive to light.  Neck: Normal range of motion. Neck supple. No tracheal deviation present.  Cardiovascular: Normal rate and regular rhythm.  Exam reveals no S3 and no S4.   Murmur heard.  Systolic murmur is present with a grade of 3/6  Respiratory: Effort normal and breath sounds normal. No stridor.  GI: Soft. Bowel sounds are normal. She exhibits no distension. There is no hepatosplenomegaly, splenomegaly or hepatomegaly. There is tenderness in the right lower quadrant. There is tenderness at McBurney's point. There is no rigidity and no  CVA tenderness.  Musculoskeletal: Normal range of motion. She exhibits no edema or deformity.  Neurological: She is alert and oriented to person, place, and time.  Skin: Skin is warm and dry. No rash noted. She is not diaphoretic. No erythema.  Psychiatric: She has a normal mood and affect. Her behavior is normal.     Assessment/Plan 66 yo female with morbid obesity presents with acute appendicitis confirmed by CT scan -IV abx -NPO -OR for lap appy today -pain control  Mickeal Skinner, MD 05/06/2016, 6:38 AM

## 2016-05-06 NOTE — Anesthesia Procedure Notes (Signed)
Procedure Name: Intubation Date/Time: 05/06/2016 10:16 AM Performed by: Manuela Schwartz B Pre-anesthesia Checklist: Patient identified, Emergency Drugs available, Suction available, Patient being monitored and Timeout performed Patient Re-evaluated:Patient Re-evaluated prior to inductionOxygen Delivery Method: Circle system utilized Preoxygenation: Pre-oxygenation with 100% oxygen Intubation Type: IV induction and Rapid sequence Laryngoscope Size: Mac and 3 Grade View: Grade I Tube type: Oral Tube size: 7.0 mm Number of attempts: 1 Airway Equipment and Method: Stylet Placement Confirmation: ETT inserted through vocal cords under direct vision,  positive ETCO2 and breath sounds checked- equal and bilateral Secured at: 21 cm Tube secured with: Tape Dental Injury: Teeth and Oropharynx as per pre-operative assessment

## 2016-05-06 NOTE — ED Notes (Signed)
Pt dressing into gown. Cell phone-valuable to be given to security.

## 2016-05-06 NOTE — Anesthesia Postprocedure Evaluation (Signed)
Anesthesia Post Note  Patient: Joylynn Newberg  Procedure(s) Performed: Procedure(s) (LRB): LAPAROSCOPIC APPENDECTOMY (N/A)  Patient location during evaluation: PACU Anesthesia Type: General Level of consciousness: awake, sedated and oriented Pain management: pain level controlled Vital Signs Assessment: post-procedure vital signs reviewed and stable Respiratory status: spontaneous breathing, nonlabored ventilation, respiratory function stable and patient connected to nasal cannula oxygen Cardiovascular status: blood pressure returned to baseline and stable Postop Assessment: no signs of nausea or vomiting Anesthetic complications: no       Last Vitals:  Vitals:   05/06/16 1315 05/06/16 1328  BP: 131/72   Pulse: 79 (P) 79  Resp: 20 (P) 18  Temp: 36.5 C (P) 36.5 C    Last Pain:  Vitals:   05/06/16 1328  TempSrc: (P) Oral  PainSc:                  Brittnae Aschenbrenner,JAMES TERRILL

## 2016-05-06 NOTE — Transfer of Care (Signed)
Immediate Anesthesia Transfer of Care Note  Patient: Christine [name]  Procedure(s) Performed: Procedure(s): LAPAROSCOPIC APPENDECTOMY (N/A)  Patient Location: PACU  Anesthesia Type:General  Level of Consciousness: awake and responds to stimulation  Airway & Oxygen Therapy: Patient Spontanous Breathing and Patient connected to nasal cannula oxygen  Post-op Assessment: Report given to RN and Post -op Vital signs reviewed and stable  Post vital signs: Reviewed and stable  Last Vitals:  Vitals:   05/06/16 0700 05/06/16 0800  BP: 118/57 135/68  Pulse: 84 87  Resp:    Temp:      Last Pain:  Vitals:   05/06/16 0820  TempSrc:   PainSc: 2          Complications: No apparent anesthesia complications

## 2016-05-06 NOTE — Anesthesia Preprocedure Evaluation (Signed)
Anesthesia Evaluation  Patient identified by MRN, date of birth, ID band Patient awake    Reviewed: Allergy & Precautions, NPO status , Patient's Chart, lab work & pertinent test results  History of Anesthesia Complications Negative for: history of anesthetic complications  Airway Mallampati: II  TM Distance: <3 FB Neck ROM: Full    Dental  (+) Teeth Intact   Pulmonary neg pulmonary ROS,    breath sounds clear to auscultation       Cardiovascular hypertension,  Rhythm:Regular Rate:Normal     Neuro/Psych    GI/Hepatic Neg liver ROS, GERD  ,  Endo/Other  Morbid obesity  Renal/GU negative Renal ROS     Musculoskeletal   Abdominal (+) + obese,   Peds  Hematology   Anesthesia Other Findings   Reproductive/Obstetrics                             Anesthesia Physical Anesthesia Plan  ASA: II  Anesthesia Plan: General   Post-op Pain Management:    Induction: Intravenous  Airway Management Planned: Oral ETT  Additional Equipment:   Intra-op Plan:   Post-operative Plan: Extubation in OR  Informed Consent: I have reviewed the patients History and Physical, chart, labs and discussed the procedure including the risks, benefits and alternatives for the proposed anesthesia with the patient or authorized representative who has indicated his/her understanding and acceptance.   Dental advisory given  Plan Discussed with: CRNA  Anesthesia Plan Comments:         Anesthesia Quick Evaluation

## 2016-05-06 NOTE — ED Provider Notes (Signed)
RLQ tenderness.  Acute appendicitis on CT  I personally reviewed the imaging tests through PACS system I reviewed available ER/hospitalization records through the EMR   Consult to general surgery   Jola Schmidt, MD 05/06/16 [phone]

## 2016-05-07 ENCOUNTER — Encounter (HOSPITAL_COMMUNITY): Payer: Self-pay | Admitting: Surgery

## 2016-05-07 LAB — BASIC METABOLIC PANEL
Anion gap: 9 (ref 5–15)
BUN: 20 mg/dL (ref 6–20)
CHLORIDE: 104 mmol/L (ref 101–111)
CO2: 24 mmol/L (ref 22–32)
CREATININE: 0.94 mg/dL (ref 0.44–1.00)
Calcium: 8.8 mg/dL — ABNORMAL LOW (ref 8.9–10.3)
GFR calc Af Amer: >60 mL/min (ref >60)
GFR calc non Af Amer: >60 mL/min (ref >60)
GLUCOSE: 94 mg/dL (ref 65–99)
POTASSIUM: 3.5 mmol/L (ref 3.5–5.1)
Sodium: 137 mmol/L (ref 135–145)

## 2016-05-07 LAB — CBC
HEMATOCRIT: 29.8 % — AB (ref 36.0–46.0)
Hemoglobin: 9.5 g/dL — ABNORMAL LOW (ref 12.0–15.0)
MCH: 26.8 pg (ref 26.0–34.0)
MCHC: 31.9 g/dL (ref 30.0–36.0)
MCV: 84.2 fL (ref 78.0–100.0)
Platelets: 265 10*3/uL (ref 150–400)
RBC: 3.54 MIL/uL — ABNORMAL LOW (ref 3.87–5.11)
RDW: 15.5 % (ref 11.5–15.5)
WBC: 10.1 10*3/uL (ref 4.0–10.5)

## 2016-05-07 MED ORDER — AMOXICILLIN-POT CLAVULANATE 875-125 MG PO TABS: 1.0000 | ORAL_TABLET | Freq: Two times a day (BID) | ORAL | 0 refills | Status: DC

## 2016-05-07 MED ORDER — SACCHAROMYCES BOULARDII 250 MG PO CAPS: ORAL_CAPSULE | ORAL | Status: DC

## 2016-05-07 MED ORDER — OXYCODONE HCL 5 MG PO TABS: 5.0000 mg | ORAL_TABLET | ORAL | 0 refills | Status: DC | PRN

## 2016-05-07 MED ORDER — AMOXICILLIN-POT CLAVULANATE 875-125 MG PO TABS
1.0000 | ORAL_TABLET | Freq: Two times a day (BID) | ORAL | Status: DC
  Administered 2016-05-07: 1 via ORAL
  Filled 2016-05-07: qty 1

## 2016-05-07 MED ORDER — ACETAMINOPHEN 325 MG PO TABS: 650.0000 mg | ORAL_TABLET | Freq: Four times a day (QID) | ORAL | Status: DC | PRN

## 2016-05-07 MED ORDER — SACCHAROMYCES BOULARDII 250 MG PO CAPS: 250.0000 mg | ORAL_CAPSULE | Freq: Two times a day (BID) | ORAL | Status: DC

## 2016-05-07 MED ORDER — SACCHAROMYCES BOULARDII 250 MG PO CAPS
250.0000 mg | ORAL_CAPSULE | Freq: Two times a day (BID) | ORAL | Status: DC
  Administered 2016-05-07: 250 mg via ORAL
  Filled 2016-05-07: qty 1

## 2016-05-07 NOTE — Progress Notes (Signed)
Discharge home. Home discharge instruction given, no question verbalized. 

## 2016-05-07 NOTE — Discharge Summary (Signed)
Physician Discharge Summary  Patient ID: Christine [name] MRN: [id number] DOB/AGE: 11/25/1950 66 y.o.  Admit date: 05/05/2016 Discharge date: 05/07/2016  Admission Diagnoses:  Acute appendicitis History of GERD Gout Hypertension Obesity  Discharge Diagnoses:  Acute appendicitis without mention of peritonitis  status post laparoscopic appendectomy, 03/06/17 Dr. Donnie Mesa History of GERD Gout Hypertension  Active Problems:   Acute appendicitis   PROCEDURES: Laparoscopic appendectomy 05/06/16 Dr. Alonna Buckler Course:  66 yo female with 2 days of abdominal pain. It began as diffuse pain all over and now has localized to the right lower quadrant. She has not had pain like this, it does not radiate. She is nauseated but has not vomited. She has not had diarrhea or fevers at home. She has anorexia.  She was admitted and taken to the OR the following AM, she tolerated the procedure well. She was mobilized and her diet was advanced and she was ready for discharge later the first post op day. Dr. Georgette Dover would like to keep her on antibiotics for 7 days post op.    Temp:  [97.6 F (36.4 C)-98.6 F (37 C)] 98.6 F (37 C) (01/12 0548) Pulse Rate:  [75-87] 84 (01/12 0548) Resp:  [18-20] 18 (01/12 0548) BP: (121-143)/(49-82) 121/60 (01/12 0548) SpO2:  [92 %-99 %] 95 % (01/12 0548) Weight:  [134.7 kg (297 lb)] 134.7 kg (297 lb) (01/11 1328)  840 PO 1400 IV Urine 475 Afebrile, VSS Labs OK creatinine is back to normal PE: General appearance: alert, cooperative and no distress Resp: clear to auscultation bilaterally GI: Soft, sore, dressing over his sites are clean and dry.  CBC Latest Ref Rng & Units 05/07/2016 05/06/2016 05/05/2016  WBC 4.0 - 10.5 K/uL 10.1 10.1 10.5  Hemoglobin 12.0 - 15.0 g/dL 9.5(L) 10.2(L) 11.3(L)  Hematocrit 36.0 - 46.0 % 29.8(L) 32.1(L) 34.7(L)  Platelets 150 - 400 K/uL 265 267 298   CMP Latest Ref Rng & Units 05/07/2016 05/06/2016 05/05/2016  Glucose 65 -  99 mg/dL 94 - 100(H)  BUN 6 - 20 mg/dL 20 - 22(H)  Creatinine 0.44 - 1.00 mg/dL 0.94 1.02(H) 0.87  Sodium 135 - 145 mmol/L 137 - 138  Potassium 3.5 - 5.1 mmol/L 3.5 - 4.0  Chloride 101 - 111 mmol/L 104 - 104  CO2 22 - 32 mmol/L 24 - 23  Calcium 8.9 - 10.3 mg/dL 8.8(L) - 10.1  Total Protein 6.5 - 8.1 g/dL - - 8.4(H)  Total Bilirubin 0.3 - 1.2 mg/dL - - 0.5  Alkaline Phos 38 - 126 U/L - - 81  AST 15 - 41 U/L - - 21  ALT 14 - 54 U/L - - 18    Condition on D/c:  Improved      Disposition: Final discharge disposition not confirmed   Allergies as of 05/07/2016      Reactions   Sulfa Antibiotics Rash      Medication List    STOP taking these medications   ibuprofen 800 MG tablet Commonly known as:  ADVIL,MOTRIN     TAKE these medications   acetaminophen 325 MG tablet Commonly known as:  TYLENOL Take 2 tablets (650 mg total) by mouth every 6 (six) hours as needed for mild pain (or temp > 100).   allopurinol 100 MG tablet Commonly known as:  ZYLOPRIM Take 100 mg by mouth daily.   ALPRAZolam 0.5 MG tablet Commonly known as:  XANAX Take 0.5 mg by mouth at bedtime as needed for anxiety.   amoxicillin-clavulanate 875-125  MG tablet Commonly known as:  AUGMENTIN Take 1 tablet by mouth every 12 (twelve) hours.   DULoxetine 30 MG capsule Commonly known as:  CYMBALTA Take 30 mg by mouth daily.   metoprolol 50 MG tablet Commonly known as:  LOPRESSOR Take 50 mg by mouth daily.   oxyCODONE 5 MG immediate release tablet Commonly known as:  Oxy IR/ROXICODONE Take 1-2 tablets (5-10 mg total) by mouth every 4 (four) hours as needed for moderate pain.   pantoprazole 40 MG tablet Commonly known as:  PROTONIX Take 40 mg by mouth daily.   saccharomyces boulardii 250 MG capsule Commonly known as:  FLORASTOR Take 1 capsule (250 mg total) by mouth 2 (two) times daily.   triamterene-hydrochlorothiazide 75-50 MG tablet Commonly known as:  MAXZIDE Take 1 tablet by mouth  daily.      Follow-up [facility] Follow up on 05/18/2016.   Specialty:  General Surgery Why:  Your appointment is 05/18/16 @ 11:00am. Please arrive 30 minutes prior to your appointmen to check in and fill out necessary paperwork. Contact information: [address] [place] [phone]          Signed: Earnstine Regal 05/07/2016, 11:12 AM

## 2016-05-07 NOTE — Progress Notes (Signed)
1 Day Post-Op  Subjective: She is sore but up and doing well.  Ordering breakfast at this time.  Objective: Vital signs in last 24 hours: Temp:  [97.6 F (36.4 C)-98.6 F (37 C)] 98.6 F (37 C) (01/12 0548) Pulse Rate:  [75-87] 84 (01/12 0548) Resp:  [18-20] 18 (01/12 0548) BP: (121-143)/(49-82) 121/60 (01/12 0548) SpO2:  [92 %-99 %] 95 % (01/12 0548) Weight:  [134.7 kg (297 lb)] 134.7 kg (297 lb) (01/11 1328)  840 PO 1400 IV Urine 475 Afebrile, VSS Labs OK creatinine is back to normal Intake/Output from previous day: 01/11 0701 - 01/12 0700 In: 2279.2 [P.O.:840; I.V.:1389.2; IV Piggyback:50] Out: 485 [Urine:475; Blood:10] Intake/Output this shift: No intake/output data recorded.  General appearance: alert, cooperative and no distress Resp: clear to auscultation bilaterally GI: Soft, sore, dressing over his sites are clean and dry.  Lab Results:   Recent Labs  05/06/16 1359 05/07/16 0401  WBC 10.1 10.1  HGB 10.2* 9.5*  HCT 32.1* 29.8*  PLT 267 265    BMET  Recent Labs  05/05/16 1528 05/06/16 1359 05/07/16 0401  NA 138  --  137  K 4.0  --  3.5  CL 104  --  104  CO2 23  --  24  GLUCOSE 100*  --  94  BUN 22*  --  20  CREATININE 0.87 1.02* 0.94  CALCIUM 10.1  --  8.8*   PT/INR No results for input(s): LABPROT, INR in the last 72 hours.   Recent Labs Lab 05/05/16 1528  AST 21  ALT 18  ALKPHOS 81  BILITOT 0.5  PROT 8.4*  ALBUMIN 4.0     Lipase     Component Value Date/Time   LIPASE 16 05/05/2016 1528     Studies/Results: Ct Abdomen Pelvis W Contrast  Result Date: 05/06/2016 CLINICAL DATA:  Abdominal pain and nausea all day yesterday. EXAM: CT ABDOMEN AND PELVIS WITH CONTRAST TECHNIQUE: Multidetector CT imaging of the abdomen and pelvis was performed using the standard protocol following bolus administration of intravenous contrast. CONTRAST:  100 ml ISOVUE-300 IOPAMIDOL (ISOVUE-300) INJECTION 61% COMPARISON:  None. FINDINGS: Lower chest:  Linear atelectasis in the lung bases. Hepatobiliary: No focal liver abnormality is seen. No gallstones, gallbladder wall thickening, or biliary dilatation. Pancreas: Unremarkable. No pancreatic ductal dilatation or surrounding inflammatory changes. Spleen: Normal in size without focal abnormality. Adrenals/Urinary Tract: No adrenal gland nodules. Cyst in the lower pole left kidney measuring 5.8 cm diameter. No hydronephrosis or hydroureter. No bladder wall thickening or bladder stones. Stomach/Bowel: Motion artifact limits evaluation of the bowel. Stomach, small bowel, and colon are mostly decompressed. The appendix is distended with diameter measured at 12 mm. There is infiltration in the periappendiceal fat. Changes are consistent with acute appendicitis. No abscess. Vascular/Lymphatic: No significant vascular findings are present. No enlarged abdominal or pelvic lymph nodes. Reproductive: Status post hysterectomy. No adnexal masses. Other: No abdominal wall hernia or abnormality. No abdominopelvic ascites. Musculoskeletal: Mild degenerative changes in the spine. No destructive bone lesions. IMPRESSION: Distended appendix with periappendiceal fat stranding consistent with acute appendicitis. No abscess. Electronically Signed   By: Lucienne Capers M.D.   On: 05/06/2016 00:37   Prior to Admission medications   Medication Sig Start Date End Date Taking? Authorizing Provider  allopurinol (ZYLOPRIM) 100 MG tablet Take 100 mg by mouth daily.   Yes Historical Provider, MD  ALPRAZolam Duanne Moron) 0.5 MG tablet Take 0.5 mg by mouth at bedtime as needed for anxiety.   Yes Historical  Provider, MD  DULoxetine (CYMBALTA) 30 MG capsule Take 30 mg by mouth daily.   Yes Historical Provider, MD  ibuprofen (ADVIL,MOTRIN) 800 MG tablet Take 400 mg by mouth every 8 (eight) hours as needed for mild pain.   Yes Historical Provider, MD  metoprolol (LOPRESSOR) 50 MG tablet Take 50 mg by mouth daily.   Yes Historical Provider, MD   pantoprazole (PROTONIX) 40 MG tablet Take 40 mg by mouth daily. 02/12/16  Yes Historical Provider, MD  triamterene-hydrochlorothiazide (MAXZIDE) 75-50 MG tablet Take 1 tablet by mouth daily. 02/10/16  Yes Historical Provider, MD    Medications: . allopurinol  100 mg Oral Daily  . cefTRIAXone (ROCEPHIN)  IV  2 g Intravenous Q24H   And  . metroNIDAZOLE  500 mg Oral Q8H  . DULoxetine  30 mg Oral Daily  . enoxaparin (LOVENOX) injection  40 mg Subcutaneous Q24H  . metoprolol  50 mg Oral Daily  . pantoprazole  40 mg Oral Daily  . triamterene-hydrochlorothiazide  1 tablet Oral Daily   . sodium chloride 50 mL/hr at 05/07/16 0248  . lactated ringers 10 mL/hr at 05/06/16 [id number]    Assessment/Plan Acute appendicitis without mention of peritonitis  status post laparoscopic appendectomy, 03/06/17 Dr. Donnie Mesa History of GERD Gout Hypertension Obesity FEN:IV fluids/soft diet ID: day 2 rocephin completed, 1 day of Flagyl =>>transition to PO augmentin DVT:  Lovenox  Plan: Mobilize, saline lock IV, convert to by mouth Augmentin. He does well with breakfast will plan discharge later this a.m. on 7 days of Augmentin and probiotic. No NSAID use, Tylenol and oxycodone for pain. Patient's been instructed.    LOS: 1 day    Daliya Parchment 05/07/2016 [phone]

## 2016-05-13 ENCOUNTER — Ambulatory Visit (HOSPITAL_COMMUNITY)
Admission: EM | Admit: 2016-05-13 | Discharge: 2016-05-13 | Disposition: A | Discharge status: Home / Self Care | Payer: BLUE CROSS/BLUE SHIELD | Attending: Emergency Medicine | Admitting: Emergency Medicine

## 2016-05-13 ENCOUNTER — Encounter (HOSPITAL_COMMUNITY): Payer: Self-pay | Admitting: Emergency Medicine

## 2016-05-13 DIAGNOSIS — L03115 Cellulitis of right lower limb: Secondary | ICD-10-CM

## 2016-05-13 MED ORDER — DOXYCYCLINE HYCLATE 100 MG PO CAPS: 100.0000 mg | ORAL_CAPSULE | Freq: Two times a day (BID) | ORAL | 0 refills | Status: AC

## 2016-05-13 MED ORDER — CEPHALEXIN 500 MG PO CAPS: 500.0000 mg | ORAL_CAPSULE | Freq: Four times a day (QID) | ORAL | 0 refills | Status: AC

## 2016-05-13 MED ORDER — CEFTRIAXONE SODIUM 1 G IJ SOLR
1.0000 g | Freq: Once | INTRAMUSCULAR | Status: AC
  Administered 2016-05-13: 1 g via INTRAMUSCULAR

## 2016-05-13 MED ORDER — CEFTRIAXONE SODIUM 1 G IJ SOLR
INTRAMUSCULAR | Status: AC
  Filled 2016-05-13: qty 10

## 2016-05-13 NOTE — Discharge Instructions (Signed)
It looks like you have an infection of the right leg. We gave you a shot of antibiotics here. Please start the doxycycline and Keflex tomorrow morning. You should see improvement within 48 hours. If the redness is spreading quickly, you develop fevers, or this is not improving in 2 days, please go to the emergency room. The rise, follow-up with your PCP in 1 week for recheck.

## 2016-05-13 NOTE — ED Provider Notes (Signed)
[place]    CSN: [id number] Arrival date & time: 05/13/16  1254     History   Chief Complaint Chief Complaint  Patient presents with  . Leg Pain    HPI Christine [name] is a 66 y.o. female.   HPI She's a 66 year old woman here for evaluation of right leg pain. She was admitted to [facility] 8 days ago for appendicitis. She had an appendectomy and was discharged on Augmentin. She states they gave her a lot of fluids and her legs were very swollen when she left the hospital. They have slowly been improving, but she has developed pain and redness, especially to the right lower extremity. No fevers. No nausea or vomiting. She has been taking her Augmentin, but the last day is tomorrow.  Past Medical History:  Diagnosis Date  . Anxiety   . Arthritis    "knees" (04/26/2016)  . GERD (gastroesophageal reflux disease)   . Gout   . History of hiatal hernia   . Hypertension   . Migraine    "none since early /2017" (05/06/2016)  . Obesity   . OSA on CPAP     Patient Active Problem List   Diagnosis Date Noted  . Acute appendicitis 05/06/2016    Past Surgical History:  Procedure Laterality Date  . ABDOMINAL HYSTERECTOMY     "partial"  . APPENDECTOMY  05/06/2016  . CARDIAC CATHETERIZATION  ~ 2015  . LAPAROSCOPIC APPENDECTOMY N/A 05/06/2016   Procedure: LAPAROSCOPIC APPENDECTOMY;  Surgeon: Donnie Mesa, MD;  Location: [place];  Service: General;  Laterality: N/A;  . TONSILLECTOMY      OB History    No data available       Home Medications    Prior to Admission medications   Medication Sig Start Date End Date Taking? Authorizing Provider  acetaminophen (TYLENOL) 325 MG tablet Take 2 tablets (650 mg total) by mouth every 6 (six) hours as needed for mild pain (or temp > 100). 05/07/16   Earnstine Regal, PA-C  allopurinol (ZYLOPRIM) 100 MG tablet Take 100 mg by mouth daily.    Historical Provider, MD  ALPRAZolam Duanne Moron) 0.5 MG tablet Take 0.5 mg by mouth  at bedtime as needed for anxiety.    Historical Provider, MD  cephALEXin (KEFLEX) 500 MG capsule Take 1 capsule (500 mg total) by mouth 4 (four) times daily. 05/13/16 05/20/16  Melony Overly, MD  doxycycline (VIBRAMYCIN) 100 MG capsule Take 1 capsule (100 mg total) by mouth 2 (two) times daily. 05/13/16 05/20/16  Melony Overly, MD  DULoxetine (CYMBALTA) 30 MG capsule Take 30 mg by mouth daily.    Historical Provider, MD  metoprolol (LOPRESSOR) 50 MG tablet Take 50 mg by mouth daily.    Historical Provider, MD  oxyCODONE (OXY IR/ROXICODONE) 5 MG immediate release tablet Take 1-2 tablets (5-10 mg total) by mouth every 4 (four) hours as needed for moderate pain. 05/07/16   Earnstine Regal, PA-C  pantoprazole (PROTONIX) 40 MG tablet Take 40 mg by mouth daily. 02/12/16   Historical Provider, MD  saccharomyces boulardii (FLORASTOR) 250 MG capsule You can buy this at any drug store.  I would use till you finish your antibiotics. Patient not taking: Reported on 05/13/2016 05/07/16   Earnstine Regal, PA-C  triamterene-hydrochlorothiazide (MAXZIDE) 75-50 MG tablet Take 1 tablet by mouth daily. 02/10/16   Historical Provider, MD    Family History No family history on file.  Social History Social History  Substance Use Topics  .  Smoking status: Former Smoker    Packs/day: 0.12    Years: 5.00    Quit date: 21  . Smokeless tobacco: Never Used  . Alcohol use No     Allergies   Prednisone and Sulfa antibiotics   Review of Systems Review of Systems As in history of present illness  Physical Exam Triage Vital Signs ED Triage Vitals  Enc Vitals Group     BP 05/13/16 1316 127/60     Pulse Rate 05/13/16 1316 79     Resp 05/13/16 1316 (!) 34     Temp 05/13/16 1316 97.8 F (36.6 C)     Temp Source 05/13/16 1316 Oral     SpO2 05/13/16 1316 99 %     Weight --      Height --      Head Circumference --      Peak Flow --      Pain Score 05/13/16 1314 8     Pain Loc --      Pain Edu? --      Excl.  in [place]? --    No data found.   Updated Vital Signs BP 127/60 (BP Location: Left Arm) Comment (BP Location): large cuff  Pulse 79   Temp 97.8 F (36.6 C) (Oral)   Resp (!) 34 Comment: ambulation  SpO2 99%   Visual Acuity Right Eye Distance:   Left Eye Distance:   Bilateral Distance:    Right Eye Near:   Left Eye Near:    Bilateral Near:     Physical Exam  Constitutional: She is oriented to person, place, and time. She appears well-developed and well-nourished. No distress.  Cardiovascular: Normal rate.   Pulmonary/Chest: Effort normal.  Musculoskeletal: She exhibits edema (bilateral 1+ to mid shin).  Neurological: She is alert and oriented to person, place, and time.  Skin:  There is erythema, warmth, and tenderness to the right lower extremity. This extends midway up the shin. 2+ DP pulse on the right.     UC Treatments / Results  Labs (all labs ordered are listed, but only abnormal results are displayed) Labs Reviewed - No data to display  EKG  EKG Interpretation None       Radiology No results found.  Procedures Procedures (including critical care time)  Medications Ordered in UC Medications  cefTRIAXone (ROCEPHIN) injection 1 g (1 g Intramuscular Given 05/13/16 1340)     Initial Impression / Assessment and Plan / UC Course  I have reviewed the triage vital signs and the nursing notes.  Pertinent labs & imaging results that were available during my care of the patient were reviewed by me and considered in my medical decision making (see chart for details).     Given recent admission, she is at risk for resistant bacteria. 1 g Rocephin IM given here. Prescriptions for Keflex and doxycycline sent to pharmacy. Strict return precautions reviewed.  Final Clinical Impressions(s) / UC Diagnoses   Final diagnoses:  Cellulitis of right lower extremity    New Prescriptions New Prescriptions   CEPHALEXIN (KEFLEX) 500 MG CAPSULE    Take 1 capsule (500 mg  total) by mouth 4 (four) times daily.   DOXYCYCLINE (VIBRAMYCIN) 100 MG CAPSULE    Take 1 capsule (100 mg total) by mouth 2 (two) times daily.     Melony Overly, MD 05/13/16 [phone]

## 2016-05-13 NOTE — ED Triage Notes (Signed)
1/10 admitted for appendicitis and says she received so much fluid her legs swelled. Patient reports swelling is down a large amount, but not normal.  Patient says she urinated 13 pounds after being discharged home.  Legs are painful and thinks this is cellulitis

## 2016-09-24 NOTE — Addendum Note (Signed)
Addendum  created 09/24/16 1115 by Rica Koyanagi, MD   Sign clinical note

## 2016-09-24 NOTE — Anesthesia Postprocedure Evaluation (Signed)
Anesthesia Post Note  Patient: Angelmarie Ponzo  Procedure(s) Performed: Procedure(s) (LRB): LAPAROSCOPIC APPENDECTOMY (N/A)     Anesthesia Type: General    Last Vitals:  Vitals:   05/07/16 0548 05/07/16 0826  BP: 121/60 (!) 142/82  Pulse: 84 81  Resp: 18   Temp: 37 C     Last Pain:  Vitals:   05/07/16 0548  TempSrc: Oral  PainSc:                  Halford Goetzke,JAMES TERRILL

## 2017-04-27 ENCOUNTER — Encounter: Payer: Self-pay | Admitting: Podiatry

## 2017-04-27 ENCOUNTER — Ambulatory Visit (INDEPENDENT_AMBULATORY_CARE_PROVIDER_SITE_OTHER): Payer: BLUE CROSS/BLUE SHIELD

## 2017-04-27 ENCOUNTER — Ambulatory Visit: Payer: BLUE CROSS/BLUE SHIELD | Admitting: Podiatry

## 2017-04-27 DIAGNOSIS — M205X1 Other deformities of toe(s) (acquired), right foot: Secondary | ICD-10-CM

## 2017-04-27 DIAGNOSIS — R52 Pain, unspecified: Secondary | ICD-10-CM

## 2017-04-27 DIAGNOSIS — M659 Synovitis and tenosynovitis, unspecified: Secondary | ICD-10-CM | POA: Diagnosis not present

## 2017-04-27 NOTE — Progress Notes (Signed)
   HPI: 67 year old female presents the office today for evaluation of multiple complaints.  Patient states that over the past few years she noticed a bump developing on the dorsal aspect of her right foot.  It is painful with shoe gear and she states she is no longer able to wear shoes due to rubbing irritation of the bone.  Patient denies trauma to her right foot. Patient also states that approximately 3 weeks ago she had a trip and fall injury where she hit her left ankle on her car door.  She states that she has had some pain and tenderness ever since.  She has been taking over-the-counter ibuprofen to alleviate the pain.  She presents today for further treatment and evaluation  Past Medical History:  Diagnosis Date  . Anxiety   . Arthritis    "knees" (04/26/2016)  . GERD (gastroesophageal reflux disease)   . Gout   . History of hiatal hernia   . Hypertension   . Migraine    "none since early /2017" (05/06/2016)  . Obesity   . OSA on CPAP      Physical Exam: General: The patient is alert and oriented x3 in no acute distress.  Dermatology: Skin is warm, dry and supple bilateral lower extremities. Negative for open lesions or macerations.  Vascular: Palpable pedal pulses bilaterally.  Bilateral lower extremity lymphedema noted. Capillary refill within normal limits.  Hemosiderin deposits noted to the bilateral ankles consistent with chronic venous insufficiency.  Patient states that she has had a prior history of vein surgery.  Neurological: Epicritic and protective threshold grossly intact bilaterally.   Musculoskeletal Exam: Range of motion within normal limits to all pedal and ankle joints bilateral. Muscle strength 5/5 in all groups bilateral.  There is some limited range of motion first MPJ right foot.  Pain on palpation also noted to the medial aspect of the left ankle joint.  Radiographic Exam:  Normal osseous mineralization. Joint spaces preserved. No fracture/dislocation/boney  destruction.  There is a large prominent dorsal bone spur about the dorsal aspect of the first MPJ right foot.  Assessment: -Hallux limitus with dorsal bone spur right foot -Synovitis of left ankle secondary to trip and fall   Plan of Care:  -Patient was evaluated today.  X-rays reviewed today. -Injection of 0.5 cc Celestone Soluspan injection of the patient's left ankle joint -Compression anklet dispensed.  Patient to be weightbearing as tolerated. -Today we discussed conservative versus surgical management of the dorsal bone spur to the right foot.  Patient opts for surgical management.  All possible complications and details of the procedure were explained.  No guarantees were expressed or implied.  All patient questions answered. -Authorization for surgery initiated today.  Surgery will consist of cheilectomy right first MPJ -Postoperative shoe dispensed today -Return to clinic 1 week postop   Edrick Kins, DPM [facility]  Dr. Edrick Kins, DPM    [address], [place] [address]                Office [phone]  Fax [phone]

## 2017-04-27 NOTE — Progress Notes (Signed)
Medical Clearance Letter faxed to Dr. Seward Carol to fax number [phone].

## 2017-04-27 NOTE — Patient Instructions (Signed)
Pre-Operative Instructions  Congratulations, you have decided to take an important step towards improving your quality of life.  You can be assured that the doctors and staff at [facility] will be with you every step of the way.  Here are some important things you should know:  1. Plan to be at the surgery center/hospital at least 1 (one) hour prior to your scheduled time, unless otherwise directed by the surgical center/hospital staff.  You must have a responsible adult accompany you, remain during the surgery and drive you home.  Make sure you have directions to the surgical center/hospital to ensure you arrive on time. 2. If you are having surgery at [facility] or [facility] hospitals, you will need a copy of your medical history and physical form from your family physician within one month prior to the date of surgery. We will give you a form for your primary physician to complete.  3. We make every effort to accommodate the date you request for surgery.  However, there are times where surgery dates or times have to be moved.  We will contact you as soon as possible if a change in schedule is required.   4. No aspirin/ibuprofen for one week before surgery.  If you are on aspirin, any non-steroidal anti-inflammatory medications (Mobic, Aleve, Ibuprofen) should not be taken seven (7) days prior to your surgery.  You make take Tylenol for pain prior to surgery.  5. Medications - If you are taking daily heart and blood pressure medications, seizure, reflux, allergy, asthma, anxiety, pain or diabetes medications, make sure you notify the surgery center/hospital before the day of surgery so they can tell you which medications you should take or avoid the day of surgery. 6. No food or drink after midnight the night before surgery unless directed otherwise by surgical center/hospital staff. 7. No alcoholic beverages 24-hours prior to surgery.  No smoking 24-hours prior or 24-hours after  surgery. 8. Wear loose pants or shorts. They should be loose enough to fit over bandages, boots, and casts. 9. Don't wear slip-on shoes. Sneakers are preferred. 10. Bring your boot with you to the surgery center/hospital.  Also bring crutches or a walker if your physician has prescribed it for you.  If you do not have this equipment, it will be provided for you after surgery. 11. If you have not been contacted by the surgery center/hospital by the day before your surgery, call to confirm the date and time of your surgery. 12. Leave-time from work may vary depending on the type of surgery you have.  Appropriate arrangements should be made prior to surgery with your employer. 13. Prescriptions will be provided immediately following surgery by your doctor.  Fill these as soon as possible after surgery and take the medication as directed. Pain medications will not be refilled on weekends and must be approved by the doctor. 14. Remove nail polish on the operative foot and avoid getting pedicures prior to surgery. 15. Wash the night before surgery.  The night before surgery wash the foot and leg well with water and the antibacterial soap provided. Be sure to pay special attention to beneath the toenails and in between the toes.  Wash for at least three (3) minutes. Rinse thoroughly with water and dry well with a towel.  Perform this wash unless told not to do so by your physician.  Enclosed: 1 Ice pack (please put in freezer the night before surgery)   1 Hibiclens skin cleaner     Pre-op instructions  If you have any questions regarding the instructions, please do not hesitate to call our office.  [place]: [address], [place], [place] [address] -- [phone]  [place]: [address] [place], [place] [address] -- [phone]  [place]: [address]  [place], [place] [address] -- [phone]  [place]: [address], [address], [place],  [address] -- [phone]  Website:  [email or web address] 

## 2017-04-28 ENCOUNTER — Other Ambulatory Visit: Payer: Self-pay | Admitting: Podiatry

## 2017-04-28 DIAGNOSIS — M659 Synovitis and tenosynovitis, unspecified: Secondary | ICD-10-CM

## 2017-04-28 DIAGNOSIS — M205X1 Other deformities of toe(s) (acquired), right foot: Secondary | ICD-10-CM

## 2017-04-28 NOTE — Addendum Note (Signed)
Addended by: Tomi Bamberger on: 04/28/2017 08:49 AM   Modules accepted: Orders

## 2017-05-09 ENCOUNTER — Telehealth: Payer: Self-pay | Admitting: *Deleted

## 2017-05-09 NOTE — Telephone Encounter (Signed)
"  My surgery is set for Thursday.  I want to make sure the insurance clearance has been received.  Please give me a call."

## 2017-05-10 NOTE — Telephone Encounter (Addendum)
I am calling to let you know that we have to reschedule your surgery.  I was informed that your surgery can't be done at [facility] due to your weight.  Dr. Amalia Hailey can do it at [facility] but, they require you to have a physical and have a history and physical form completed.  The physical has to be within 30 days of the surgery date.  "So I have to get a physical.  Where do I get the form?"  You can come to our office to get one, I can mail it or I can email it to you.  "I'll pick one up at your office."  Once you get the physical scheduled, give me a call and I can try and get a date scheduled for you at [facility].  "Okay, I will."  "I got my appointment set up with Dr. Delfina Redwood.  It's going to be this Friday."  Okay great, Dr. Amalia Hailey can possibly do something on February 7.  It may have to be on maybe the Wednesday before.  I have to see what they have available.  "That will be fine."

## 2017-05-10 NOTE — Telephone Encounter (Signed)
I am calling to let you know that I could only get you scheduled for Wednesday, February 6 at 3 pm.  "That's fine.  I'll come by today to get the physical form."

## 2017-05-18 ENCOUNTER — Encounter: Payer: BLUE CROSS/BLUE SHIELD | Admitting: Podiatry

## 2017-05-20 ENCOUNTER — Telehealth: Payer: Self-pay | Admitting: *Deleted

## 2017-05-20 NOTE — Telephone Encounter (Signed)
I left a message for Dr. Lina Sar nurse, Carlyon Shadow, to give me a call regarding medical clearance for surgery for Christine [name].

## 2017-05-25 ENCOUNTER — Encounter: Payer: BLUE CROSS/BLUE SHIELD | Admitting: Podiatry

## 2017-05-27 ENCOUNTER — Encounter (HOSPITAL_COMMUNITY): Payer: Self-pay

## 2017-05-27 NOTE — Pre-Procedure Instructions (Signed)
Christine [name]  05/27/2017      [id number] [place], [place] [address] [place] [address] Phone: [phone] Fax: [phone]    Your procedure is scheduled on Feb 6.  Report to [facility] Admitting at 115pm.  Call this number if you have problems the morning of surgery:  [phone]   Remember:  Do not eat food or drink liquids after midnight.  Take these medicines the morning of surgery with A SIP OF WATER allopurinol (Zyloprim), Duloxetine (Cymbalta), Metoprolol (lopessor), Pantoprazole (Protonix)  Stop taking excedrin, aspirin, BC's, Goody's, Herbal medications, Fish Oil, Aleve, Vitamins,Ibuprofen, Advil, Motrin   Do not wear jewelry, make-up or nail polish.  Do not wear lotions, powders, or perfumes, or deodorant.  Do not shave 48 hours prior to surgery.  Men may shave face and neck.  Do not bring valuables to the hospital.  [facility] is not responsible for any belongings or valuables.  Contacts, dentures or bridgework may not be worn into surgery.  Leave your suitcase in the car.  After surgery it may be brought to your room.  For patients admitted to the hospital, discharge time will be determined by your treatment team.  Patients discharged the day of surgery will not be allowed to drive home.    Special instructions:  [place] - Preparing for Surgery  Before surgery, you can play an important role.  Because skin is not sterile, your skin needs to be as free of germs as possible.  You can reduce the number of germs on you skin by washing with CHG (chlorahexidine gluconate) soap before surgery.  CHG is an antiseptic cleaner which kills germs and bonds with the skin to continue killing germs even after washing.  Please DO NOT use if you have an allergy to CHG or antibacterial soaps.  If your skin becomes reddened/irritated stop using the CHG and inform your nurse when  you arrive at Short Stay.  Do not shave (including legs and underarms) for at least 48 hours prior to the first CHG shower.  You may shave your face.  Please follow these instructions carefully:   1.  Shower with CHG Soap the night before surgery and the   morning of Surgery.  2.  If you choose to wash your hair, wash your hair first as usual with your   normal shampoo.  3.  After you shampoo, rinse your hair and body thoroughly to remove the Shampoo.  4.  Use CHG as you would any other liquid soap.  You can apply chg directly to the skin and wash gently with scrungie or a clean washcloth.  5.  Apply the CHG Soap to your body ONLY FROM THE NECK DOWN.   Do not use on open wounds or open sores.  Avoid contact with your eyes, ears, mouth and genitals (private parts).  Wash genitals (private parts)  with your normal soap.  6.  Wash thoroughly, paying special attention to the area where your surgery will be performed.  7.  Thoroughly rinse your body with warm water from the neck down.  8.  DO NOT shower/wash with your normal soap after using and rinsing off the CHG Soap.  9.  Pat yourself dry with a clean towel.            10.  Wear clean pajamas.  11.  Place clean sheets on your bed the night of your first shower and do not sleep with pets.  Day of Surgery  Do not apply any lotions/deoderants the morning of surgery.  Please wear clean clothes to the hospital/surgery center.       Please read over the following fact sheets that you were given. Pain Booklet, Coughing and Deep Breathing and Surgical Site Infection Prevention

## 2017-05-30 ENCOUNTER — Encounter (HOSPITAL_COMMUNITY): Payer: Self-pay

## 2017-05-30 ENCOUNTER — Encounter (HOSPITAL_COMMUNITY)
Admission: RE | Admit: 2017-05-30 | Discharge: 2017-05-30 | Disposition: A | Discharge status: Home / Self Care | Payer: BLUE CROSS/BLUE SHIELD | Source: Ambulatory Visit | Attending: Podiatry | Admitting: Podiatry

## 2017-05-30 ENCOUNTER — Other Ambulatory Visit: Payer: Self-pay

## 2017-05-30 DIAGNOSIS — Z888 Allergy status to other drugs, medicaments and biological substances status: Secondary | ICD-10-CM | POA: Diagnosis not present

## 2017-05-30 DIAGNOSIS — M7751 Other enthesopathy of right foot: Secondary | ICD-10-CM | POA: Diagnosis not present

## 2017-05-30 DIAGNOSIS — G4733 Obstructive sleep apnea (adult) (pediatric): Secondary | ICD-10-CM | POA: Diagnosis not present

## 2017-05-30 DIAGNOSIS — Z87891 Personal history of nicotine dependence: Secondary | ICD-10-CM | POA: Diagnosis not present

## 2017-05-30 DIAGNOSIS — Z882 Allergy status to sulfonamides status: Secondary | ICD-10-CM | POA: Diagnosis not present

## 2017-05-30 DIAGNOSIS — Z79899 Other long term (current) drug therapy: Secondary | ICD-10-CM | POA: Diagnosis not present

## 2017-05-30 DIAGNOSIS — R7303 Prediabetes: Secondary | ICD-10-CM | POA: Diagnosis not present

## 2017-05-30 DIAGNOSIS — I2510 Atherosclerotic heart disease of native coronary artery without angina pectoris: Secondary | ICD-10-CM | POA: Diagnosis not present

## 2017-05-30 DIAGNOSIS — M205X1 Other deformities of toe(s) (acquired), right foot: Secondary | ICD-10-CM | POA: Diagnosis present

## 2017-05-30 DIAGNOSIS — I10 Essential (primary) hypertension: Secondary | ICD-10-CM | POA: Diagnosis not present

## 2017-05-30 DIAGNOSIS — Z6841 Body Mass Index (BMI) 40.0 and over, adult: Secondary | ICD-10-CM | POA: Diagnosis not present

## 2017-05-30 DIAGNOSIS — K219 Gastro-esophageal reflux disease without esophagitis: Secondary | ICD-10-CM | POA: Diagnosis not present

## 2017-05-30 HISTORY — DX: Anemia, unspecified: D64.9

## 2017-05-30 HISTORY — DX: Prediabetes: R73.03

## 2017-05-30 HISTORY — DX: Atherosclerotic heart disease of native coronary artery without angina pectoris: I25.10

## 2017-05-30 LAB — BASIC METABOLIC PANEL
Anion gap: 10 (ref 5–15)
BUN: 24 mg/dL — AB (ref 6–20)
CHLORIDE: 104 mmol/L (ref 101–111)
CO2: 24 mmol/L (ref 22–32)
CREATININE: 0.89 mg/dL (ref 0.44–1.00)
Calcium: 9.3 mg/dL (ref 8.9–10.3)
GFR calc Af Amer: >60 mL/min (ref >60)
GFR calc non Af Amer: >60 mL/min (ref >60)
Glucose, Bld: 87 mg/dL (ref 65–99)
Potassium: 3.5 mmol/L (ref 3.5–5.1)
SODIUM: 138 mmol/L (ref 135–145)

## 2017-05-30 LAB — CBC
HCT: 34.9 % — ABNORMAL LOW (ref 36.0–46.0)
Hemoglobin: 10.8 g/dL — ABNORMAL LOW (ref 12.0–15.0)
MCH: 26.6 pg (ref 26.0–34.0)
MCHC: 30.9 g/dL (ref 30.0–36.0)
MCV: 86 fL (ref 78.0–100.0)
PLATELETS: 293 10*3/uL (ref 150–400)
RBC: 4.06 MIL/uL (ref 3.87–5.11)
RDW: 17.1 % — AB (ref 11.5–15.5)
WBC: 5.8 10*3/uL (ref 4.0–10.5)

## 2017-05-30 LAB — GLUCOSE, CAPILLARY: Glucose-Capillary: 82 mg/dL (ref 65–99)

## 2017-05-30 LAB — HEMOGLOBIN A1C
Hgb A1c MFr Bld: 6.3 % — ABNORMAL HIGH (ref 4.8–5.6)
Mean Plasma Glucose: 134.11 mg/dL

## 2017-05-30 NOTE — Progress Notes (Signed)
Pt. States that she had Cardiac Cath done in 2015 @ [facility] in [place] ,[place]  Records requested

## 2017-05-30 NOTE — Progress Notes (Signed)
Dr. Amalia Hailey office notified that we need surgical orders.  We also have not received an H&P on the patient.

## 2017-05-30 NOTE — Telephone Encounter (Addendum)
"  I'm calling about Christine [name].  She's scheduled for surgery on Wednesday.  We need the doctor to put her orders in and we have not received her history and physical form yet."  I will give the patient a call.  We have not received anything from her doctor yet.  I will contact the patient and let her know and inform Dr. Amalia Hailey.      I see where you called me.  Do you have any questions?  "Did you ever get the forms from Dr. Lina Sar office?"  No, we have not received anything from his office.  I have called several times and I have faxed the forms to him.  "He's been trying to talk me out of the surgery.  He suggested I wear a bigger shoe.  I'm not doing that, this thing is getting bigger and bigger and it's bothering me.  I want to have this done.  This should have been taken care of by now.  I know he has filled the forms out because I saw it in my health record.  But of course, [facility] is not in the same system as they are."  No, we're not in the same system as [facility].  "I'll give them a call now.  I tell you I think once this is over, I'm going to find me another internal medicine doctor.  I'm going to call his nurse and get her to send it.

## 2017-05-30 NOTE — Telephone Encounter (Signed)
"  Will it be okay for me to print the note out of my chart from Dr. Delfina Redwood and bring it to you or I can download it on a flashdrive?   You can bring it to [place].  "Okay, I will bring it to you and you can fax it to the surgery center, correct?"  Yes, I can fax it to them.

## 2017-05-31 ENCOUNTER — Encounter (HOSPITAL_COMMUNITY): Payer: Self-pay

## 2017-05-31 MED ORDER — DEXTROSE 5 % IV SOLN
3.0000 g | INTRAVENOUS | Status: AC
  Administered 2017-06-01: 3 g via INTRAVENOUS
  Filled 2017-05-31: qty 3

## 2017-06-01 ENCOUNTER — Encounter (HOSPITAL_COMMUNITY): Payer: Self-pay

## 2017-06-01 ENCOUNTER — Ambulatory Visit (HOSPITAL_COMMUNITY): Payer: BLUE CROSS/BLUE SHIELD | Admitting: Vascular Surgery

## 2017-06-01 ENCOUNTER — Ambulatory Visit (HOSPITAL_COMMUNITY)
Admission: RE | Admit: 2017-06-01 | Discharge: 2017-06-01 | Disposition: A | Discharge status: Home / Self Care | Payer: BLUE CROSS/BLUE SHIELD | Source: Ambulatory Visit | Attending: Podiatry | Admitting: Podiatry

## 2017-06-01 ENCOUNTER — Encounter: Payer: Self-pay | Admitting: Podiatry

## 2017-06-01 ENCOUNTER — Ambulatory Visit (HOSPITAL_COMMUNITY): Payer: BLUE CROSS/BLUE SHIELD | Admitting: Emergency Medicine

## 2017-06-01 ENCOUNTER — Encounter (HOSPITAL_COMMUNITY)
Admission: RE | Disposition: A | Discharge status: Home / Self Care | Payer: Self-pay | Source: Ambulatory Visit | Attending: Podiatry

## 2017-06-01 DIAGNOSIS — M7751 Other enthesopathy of right foot: Secondary | ICD-10-CM | POA: Insufficient documentation

## 2017-06-01 DIAGNOSIS — M205X1 Other deformities of toe(s) (acquired), right foot: Secondary | ICD-10-CM | POA: Insufficient documentation

## 2017-06-01 DIAGNOSIS — R7303 Prediabetes: Secondary | ICD-10-CM | POA: Insufficient documentation

## 2017-06-01 DIAGNOSIS — K219 Gastro-esophageal reflux disease without esophagitis: Secondary | ICD-10-CM | POA: Insufficient documentation

## 2017-06-01 DIAGNOSIS — Z888 Allergy status to other drugs, medicaments and biological substances status: Secondary | ICD-10-CM | POA: Insufficient documentation

## 2017-06-01 DIAGNOSIS — Z79899 Other long term (current) drug therapy: Secondary | ICD-10-CM | POA: Insufficient documentation

## 2017-06-01 DIAGNOSIS — I10 Essential (primary) hypertension: Secondary | ICD-10-CM | POA: Insufficient documentation

## 2017-06-01 DIAGNOSIS — Z87891 Personal history of nicotine dependence: Secondary | ICD-10-CM | POA: Insufficient documentation

## 2017-06-01 DIAGNOSIS — G4733 Obstructive sleep apnea (adult) (pediatric): Secondary | ICD-10-CM | POA: Insufficient documentation

## 2017-06-01 DIAGNOSIS — Z882 Allergy status to sulfonamides status: Secondary | ICD-10-CM | POA: Insufficient documentation

## 2017-06-01 DIAGNOSIS — M2021 Hallux rigidus, right foot: Secondary | ICD-10-CM | POA: Diagnosis not present

## 2017-06-01 DIAGNOSIS — I2510 Atherosclerotic heart disease of native coronary artery without angina pectoris: Secondary | ICD-10-CM | POA: Insufficient documentation

## 2017-06-01 DIAGNOSIS — Z6841 Body Mass Index (BMI) 40.0 and over, adult: Secondary | ICD-10-CM | POA: Insufficient documentation

## 2017-06-01 HISTORY — PX: CHILECTOMY: SHX6287

## 2017-06-01 SURGERY — CHILECTOMY
Anesthesia: Monitor Anesthesia Care | Laterality: Right

## 2017-06-01 MED ORDER — ONDANSETRON HCL 4 MG/2ML IJ SOLN
INTRAMUSCULAR | Status: DC | PRN
  Administered 2017-06-01: 4 mg via INTRAVENOUS

## 2017-06-01 MED ORDER — PHENYLEPHRINE 40 MCG/ML (10ML) SYRINGE FOR IV PUSH (FOR BLOOD PRESSURE SUPPORT)
PREFILLED_SYRINGE | INTRAVENOUS | Status: AC
  Filled 2017-06-01: qty 10

## 2017-06-01 MED ORDER — CHLORHEXIDINE GLUCONATE 4 % EX LIQD: 60.0000 mL | Freq: Once | CUTANEOUS | Status: DC

## 2017-06-01 MED ORDER — PHENYLEPHRINE 40 MCG/ML (10ML) SYRINGE FOR IV PUSH (FOR BLOOD PRESSURE SUPPORT)
PREFILLED_SYRINGE | INTRAVENOUS | Status: DC | PRN
  Administered 2017-06-01: 80 ug via INTRAVENOUS

## 2017-06-01 MED ORDER — BUPIVACAINE HCL (PF) 0.5 % IJ SOLN
INTRAMUSCULAR | Status: AC
  Filled 2017-06-01: qty 30

## 2017-06-01 MED ORDER — LIDOCAINE HCL 2 % IJ SOLN
INTRAMUSCULAR | Status: AC
  Filled 2017-06-01: qty 20

## 2017-06-01 MED ORDER — PROPOFOL 500 MG/50ML IV EMUL
INTRAVENOUS | Status: DC | PRN
  Administered 2017-06-01: 75 ug/kg/min via INTRAVENOUS

## 2017-06-01 MED ORDER — FENTANYL CITRATE (PF) 250 MCG/5ML IJ SOLN
INTRAMUSCULAR | Status: AC
  Filled 2017-06-01: qty 5

## 2017-06-01 MED ORDER — BUPIVACAINE HCL (PF) 0.5 % IJ SOLN
INTRAMUSCULAR | Status: DC | PRN
  Administered 2017-06-01 (×2): 5 mL

## 2017-06-01 MED ORDER — OXYCODONE-ACETAMINOPHEN 5-325 MG PO TABS
ORAL_TABLET | ORAL | Status: AC
  Filled 2017-06-01: qty 1

## 2017-06-01 MED ORDER — LACTATED RINGERS IV SOLN
INTRAVENOUS | Status: DC
  Administered 2017-06-01: 13:00:00 via INTRAVENOUS

## 2017-06-01 MED ORDER — LIDOCAINE 2% (20 MG/ML) 5 ML SYRINGE
INTRAMUSCULAR | Status: DC | PRN
  Administered 2017-06-01: 20 mg via INTRAVENOUS

## 2017-06-01 MED ORDER — LIDOCAINE HCL 2 % IJ SOLN
INTRAMUSCULAR | Status: DC | PRN
  Administered 2017-06-01 (×2): 5 mL

## 2017-06-01 MED ORDER — LACTATED RINGERS IV SOLN
INTRAVENOUS | Status: DC | PRN
  Administered 2017-06-01: 16:00:00 via INTRAVENOUS

## 2017-06-01 MED ORDER — OXYCODONE-ACETAMINOPHEN 5-325 MG PO TABS
1.0000 | ORAL_TABLET | Freq: Once | ORAL | Status: AC
  Administered 2017-06-01: 1 via ORAL

## 2017-06-01 MED ORDER — FENTANYL CITRATE (PF) 100 MCG/2ML IJ SOLN
INTRAMUSCULAR | Status: DC | PRN
  Administered 2017-06-01 (×2): 50 ug via INTRAVENOUS

## 2017-06-01 MED ORDER — PROPOFOL 10 MG/ML IV BOLUS
INTRAVENOUS | Status: DC | PRN
  Administered 2017-06-01: 20 mg via INTRAVENOUS

## 2017-06-01 MED ORDER — 0.9 % SODIUM CHLORIDE (POUR BTL) OPTIME
TOPICAL | Status: DC | PRN
  Administered 2017-06-01: 500 mL

## 2017-06-01 MED ORDER — MIDAZOLAM HCL 2 MG/2ML IJ SOLN
INTRAMUSCULAR | Status: AC
  Filled 2017-06-01: qty 2

## 2017-06-01 MED ORDER — PROPOFOL 10 MG/ML IV BOLUS
INTRAVENOUS | Status: AC
  Filled 2017-06-01: qty 20

## 2017-06-01 MED ORDER — MIDAZOLAM HCL 5 MG/5ML IJ SOLN
INTRAMUSCULAR | Status: DC | PRN
  Administered 2017-06-01 (×2): 1 mg via INTRAVENOUS

## 2017-06-01 SURGICAL SUPPLY — 41 items
BANDAGE ACE 4X5 VEL STRL LF (GAUZE/BANDAGES/DRESSINGS) ×2 IMPLANT
BLADE AVERAGE 25X9 (BLADE) ×2 IMPLANT
BLADE SURG 15 STRL LF DISP TIS (BLADE) ×2 IMPLANT
BLADE SURG 15 STRL SS (BLADE) ×2
BNDG ESMARK 4X9 LF (GAUZE/BANDAGES/DRESSINGS) ×2 IMPLANT
BNDG GAUZE ELAST 4 BULKY (GAUZE/BANDAGES/DRESSINGS) ×2 IMPLANT
CHLORAPREP W/TINT 10.5 ML (MISCELLANEOUS) ×2 IMPLANT
CUFF TOURNIQUET SINGLE 18IN (TOURNIQUET CUFF) IMPLANT
CUFF TOURNIQUET SINGLE 24IN (TOURNIQUET CUFF) IMPLANT
DRAPE EXTREMITY T 121X128X90 (DRAPE) ×2 IMPLANT
DRAPE OEC MINIVIEW 54X84 (DRAPES) ×2 IMPLANT
DRAPE U-SHAPE 47X51 STRL (DRAPES) ×2 IMPLANT
DRSG EMULSION OIL 3X3 NADH (GAUZE/BANDAGES/DRESSINGS) ×2 IMPLANT
ELECT REM PT RETURN 9FT ADLT (ELECTROSURGICAL) ×2
ELECTRODE REM PT RTRN 9FT ADLT (ELECTROSURGICAL) ×1 IMPLANT
GAUZE SPONGE 4X4 12PLY STRL (GAUZE/BANDAGES/DRESSINGS) ×2 IMPLANT
GAUZE XEROFORM 1X8 LF (GAUZE/BANDAGES/DRESSINGS) ×2 IMPLANT
GLOVE BIO SURGEON STRL SZ8 (GLOVE) ×2 IMPLANT
GLOVE BIOGEL PI IND STRL 8 (GLOVE) ×1 IMPLANT
GLOVE BIOGEL PI INDICATOR 8 (GLOVE) ×1
GOWN STRL REUS W/ TWL LRG LVL3 (GOWN DISPOSABLE) ×1 IMPLANT
GOWN STRL REUS W/ TWL XL LVL3 (GOWN DISPOSABLE) ×1 IMPLANT
GOWN STRL REUS W/TWL LRG LVL3 (GOWN DISPOSABLE) ×1
GOWN STRL REUS W/TWL XL LVL3 (GOWN DISPOSABLE) ×1
KIT BASIN OR (CUSTOM PROCEDURE TRAY) ×2 IMPLANT
NDL SAFETY ECLIPSE 18X1.5 (NEEDLE) IMPLANT
NEEDLE HYPO 18GX1.5 SHARP (NEEDLE)
NEEDLE HYPO 25X1 1.5 SAFETY (NEEDLE) ×4 IMPLANT
NS IRRIG 1000ML POUR BTL (IV SOLUTION) IMPLANT
PACK ORTHO EXTREMITY (CUSTOM PROCEDURE TRAY) ×2 IMPLANT
PADDING CAST ABS 4INX4YD NS (CAST SUPPLIES) ×2
PADDING CAST ABS COTTON 4X4 ST (CAST SUPPLIES) ×2 IMPLANT
SCRUB BETADINE 4OZ XXX (MISCELLANEOUS) IMPLANT
SPLINT FIBERGLASS 4X30 (CAST SUPPLIES) ×2 IMPLANT
STAPLER VISISTAT (STAPLE) ×2 IMPLANT
SUT PROLENE 4 0 PS 2 18 (SUTURE) ×2 IMPLANT
SUT VIC AB 4-0 PS2 27 (SUTURE) ×2 IMPLANT
SUT VICRYL 4-0 PS2 18IN ABS (SUTURE) IMPLANT
SYR BULB 3OZ (MISCELLANEOUS) ×2 IMPLANT
SYR CONTROL 10ML LL (SYRINGE) ×2 IMPLANT
UNDERPAD 30X30 (UNDERPADS AND DIAPERS) IMPLANT

## 2017-06-01 NOTE — Anesthesia Postprocedure Evaluation (Signed)
Anesthesia Post Note  Patient: Christine [name]  Procedure(s) Performed: CHILECTOMY RIGHT FOOT (Right )     Patient location during evaluation: PACU Anesthesia Type: MAC Level of consciousness: awake and alert Pain management: pain level controlled Vital Signs Assessment: post-procedure vital signs reviewed and stable Respiratory status: spontaneous breathing, nonlabored ventilation, respiratory function stable and patient connected to nasal cannula oxygen Cardiovascular status: stable and blood pressure returned to baseline Postop Assessment: no apparent nausea or vomiting Anesthetic complications: no    Last Vitals:  Vitals:   06/01/17 1645 06/01/17 1700  BP: (!) 158/72 132/82  Pulse: 68 63  Resp: 18 16  Temp:    SpO2: 100% 100%    Last Pain:  Vitals:   06/01/17 1700  TempSrc:   PainSc: 3                  [name]

## 2017-06-01 NOTE — Op Note (Signed)
OPERATIVE REPORT Patient name: Christine [name] MRN: [id number] DOB: Apr 08, 1951  DOS:  06/01/2017  Preop Dx: Hallux limitus with spur right  Postop Dx: same  Procedure:  1. Cheilectomy right foot  Surgeon: Edrick Kins DPM  Anesthesia: 50-50 mixture of 2% lidocaine plain with 0.5% Marcaine plain totaling 71mL infiltrated in the patient's right lower extremity  Hemostasis: Ankle tourniquet inflated to a pressure of 252mmHg after esmarch exsanguination   EBL: 10 mL Materials: None Injectables: None Pathology: None  Condition: The patient tolerated the procedure and anesthesia well. No complications noted or reported   Justification for procedure: The patient is a 67 y.o. female who presents today for surgical correction of right hallux limitus with bone spur formation. All conservative modalities of been unsuccessful in providing any sort of satisfactory alleviation of symptoms with the patient. The patient was told benefits as well as possible side effects of the surgery. The patient consented for surgical correction. The patient consent form was reviewed. All patient questions were answered. No guarantees were expressed or implied. The patient and the surgeon boson the patient consent form with the witness present and placed in the patient's chart.   Procedure in Detail: The patient was brought to the operating room, placed in the operating table in the supine position at which time an aseptic scrub and drape were performed about the patient's respective lower extremity after anesthesia was induced as described above. Attention was then directed to the surgical area where procedure number one commenced.  Procedure #1: Cheilectomy right foot  A 4 cm linear longitudinal skin incision was planned and made overlying the dorsal medial aspect of the first MPJ right foot.  The incision was carried down to the level of joint capsule and bone spur with care taken to cut clamp ligate or  retracted well small neurovascular structures traversing the incision site.  At that time a joint capsulotomy was performed and the joint capsule and periosteal tissue was reflected away using sharp tissue dissection to expose the underlying bone spurs surrounding the first metatarsal phalangeal joint.  Sagittal saw mount a sagittal blade was utilized to resect away all hypertrophic bone spur formation around the first MPJ.  Increased range of motion was noted.  Any sharp protuberances were smoothed down to a smooth anatomical contour using the sagittal blade and copious irrigation was utilized.  Intraoperative x-ray fluoroscopy was utilized to visualize the removal and resection of the hypertrophic portions of the first MPJ contributory to the hallux limitus.  4-0 Vicryl suture was utilized to reapproximate subcutaneous tissue followed by stainless steel skin staples to reapproximate superficial skin edges.  Dry sterile compressive dressings were then applied to all previously mentioned incision sites about the patient's lower extremity. The tourniquet which was used for hemostasis was deflated. All normal neurovascular responses including pink color and warmth returned all the digits of patient's lower extremity.  The patient was then transferred from the operating room to the recovery room having tolerated the procedure and anesthesia well. All vital signs are stable. After a brief stay in the recovery room the patient was discharged with adequate prescriptions for analgesia. Verbal as well as written instructions were provided for the patient regarding wound care. The patient is to keep the dressings clean dry and intact until they are to follow surgeon Dr. Daylene Katayama in the office upon discharge.   Edrick Kins, DPM [facility]  Dr. Edrick Kins, DPM    [address]  [place], [place] [address]                Office [phone]  Fax  [phone]

## 2017-06-01 NOTE — H&P (View-Only) (Signed)
Anesthesia Chart Review:  Pt is a 67 year old female scheduled for chilectomy R foot on 06/01/2017 with Daylene Katayama, DPM  - PCP is Seward Carol, [place] who provided H&P for procedure dated 05/13/17  PMH includes:  CAD (mild per 2015 cath), HTN, OSA, anemia, pre-diabetes. Former smoker. BMI 56. S/p appendectomy 05/06/16.   Medications include: Allegra-D, iron, metoprolol, Protonix, Maxzide  BP (!) 144/82   Pulse 76   Temp 36.4 C (Oral)   Resp 20   Ht 5\' 1"  (1.549 m)   Wt 295 lb 6.4 oz (134 kg)   SpO2 99%   BMI 55.82 kg/m   Preoperative labs reviewed.   - HbA1c 6.3, glucose   EKG 05/30/17: NSR.   Cardiac cath 03/04/14 (Care everywhere):  - LM: smooth and free of significant disease - LAD: minimal luminal irregularities throughout. D1 is medium caliber vessel which is smooth and has minimal luminal irregularities.The remainder of the diagonal vessels are small in caliber. - LCX: minimal luminal irregularities. OM1 proximal 30% eccentric narrowing. OM2 is small.  OM3 has minimal luminal irregularities.  - RCA: proximal eccentric 30% narrowing RECOMMENDATIONS:  1.Medical therapy and risk factor modification with focus on blood pressure control.  If no changes, I anticipate pt can proceed with surgery as scheduled.   Willeen Cass, FNP-BC [facility]/Anesthesiology Phone: [phone] 06/01/2017 9:19 AM

## 2017-06-01 NOTE — Interval H&P Note (Signed)
History and Physical Interval Note:  06/01/2017 1:29 PM  Christine [name]  has presented today for surgery, with the diagnosis of HALLUX LIMITUS  The various methods of treatment have been discussed with the patient and family. After consideration of risks, benefits and other options for treatment, the patient has consented to  Procedure(s): CHILECTOMY RIGHT FOOT (Right) as a surgical intervention .  The patient's history has been reviewed, patient examined, no change in status, stable for surgery.  I have reviewed the patient's chart and labs.  Questions were answered to the patient's satisfaction.     Edrick Kins

## 2017-06-01 NOTE — Progress Notes (Addendum)
Anesthesia Chart Review:  Pt is a 67 year old female scheduled for chilectomy R foot on 06/01/2017 with Daylene Katayama, DPM  - PCP is Seward Carol, [place] who provided H&P for procedure dated 05/13/17  PMH includes:  CAD (mild per 2015 cath), HTN, OSA, anemia, pre-diabetes. Former smoker. BMI 56. S/p appendectomy 05/06/16.   Medications include: Allegra-D, iron, metoprolol, Protonix, Maxzide  BP (!) 144/82   Pulse 76   Temp 36.4 C (Oral)   Resp 20   Ht 5\' 1"  (1.549 m)   Wt 295 lb 6.4 oz (134 kg)   SpO2 99%   BMI 55.82 kg/m   Preoperative labs reviewed.   - HbA1c 6.3, glucose   EKG 05/30/17: NSR.   Cardiac cath 03/04/14 (Care everywhere):  - LM: smooth and free of significant disease - LAD: minimal luminal irregularities throughout. D1 is medium caliber vessel which is smooth and has minimal luminal irregularities.The remainder of the diagonal vessels are small in caliber. - LCX: minimal luminal irregularities. OM1 proximal 30% eccentric narrowing. OM2 is small.  OM3 has minimal luminal irregularities.  - RCA: proximal eccentric 30% narrowing RECOMMENDATIONS:  1.Medical therapy and risk factor modification with focus on blood pressure control.  If no changes, I anticipate pt can proceed with surgery as scheduled.   Willeen Cass, FNP-BC [facility]/Anesthesiology Phone: [phone] 06/01/2017 9:19 AM

## 2017-06-01 NOTE — Transfer of Care (Signed)
Immediate Anesthesia Transfer of Care Note  Patient: Steele Ledonne  Procedure(s) Performed: CHILECTOMY RIGHT FOOT (Right )  Patient Location: PACU  Anesthesia Type:MAC  Level of Consciousness: awake, alert , oriented and patient cooperative  Airway & Oxygen Therapy: Patient Spontanous Breathing and Patient connected to nasal cannula oxygen  Post-op Assessment: Report given to RN, Post -op Vital signs reviewed and stable and Patient moving all extremities X 4  Post vital signs: Reviewed and stable  Last Vitals:  Vitals:   06/01/17 1310  BP: (!) 147/69  Pulse: 71  Resp: 18  Temp: 36.4 C  SpO2: 99%    Last Pain:  Vitals:   06/01/17 1310  TempSrc: Oral         Complications: No apparent anesthesia complications

## 2017-06-01 NOTE — Anesthesia Preprocedure Evaluation (Signed)
Anesthesia Evaluation  Patient identified by MRN, date of birth, ID band Patient awake    Reviewed: Allergy & Precautions, NPO status , Patient's Chart, lab work & pertinent test results  History of Anesthesia Complications Negative for: history of anesthetic complications  Airway Mallampati: II  TM Distance: <3 FB Neck ROM: Full    Dental  (+) Teeth Intact   Pulmonary sleep apnea , former smoker,    breath sounds clear to auscultation       Cardiovascular hypertension, Pt. on medications and Pt. on home beta blockers + CAD  (-) CHF  Rhythm:Regular Rate:Normal     Neuro/Psych  Headaches, PSYCHIATRIC DISORDERS Anxiety    GI/Hepatic Neg liver ROS, hiatal hernia, GERD  Medicated and Controlled,  Endo/Other  Morbid obesity  Renal/GU negative Renal ROS     Musculoskeletal  (+) Arthritis ,   Abdominal (+) + obese,   Peds  Hematology  (+) anemia ,   Anesthesia Other Findings PMH includes:  CAD (mild per 2015 cath), HTN, OSA, anemia, pre-diabetes. Former smoker. BMI 56. S/p appendectomy 05/06/16.   Medications include: Allegra-D, iron, metoprolol, Protonix, Maxzide  BP (!) 144/82   Pulse 76   Temp 36.4 C (Oral)   Resp 20   Ht _0  (1.549 m)   Wt 295 lb 6.4 oz (134 kg)   SpO2 99%   BMI 55.82 kg/m   Preoperative labs reviewed.   - HbA1c 6.3, glucose   EKG 05/30/17: NSR.   Cardiac cath 03/04/14 (Care everywhere):  - LM: smooth and free of significant disease - LAD: minimal luminal irregularities throughout. D1 is medium caliber vessel which is smooth and has minimal luminal irregularities.The remainder of the diagonal vessels are small in caliber. - LCX: minimal luminal irregularities. OM1 proximal 30% eccentric narrowing. OM2 is small.  OM3 has minimal luminal irregularities.  - RCA: proximal eccentric 30% narrowing RECOMMENDATIONS:  1.Medical therapy and risk factor modification with focus  on blood pressure control.    Reproductive/Obstetrics                             Anesthesia Physical Anesthesia Plan  ASA: III  Anesthesia Plan: MAC   Post-op Pain Management:    Induction: Intravenous  PONV Risk Score and Plan: 2 and Ondansetron  Airway Management Planned: Nasal Cannula  Additional Equipment:   Intra-op Plan:   Post-operative Plan:   Informed Consent: I have reviewed the patients History and Physical, chart, labs and discussed the procedure including the risks, benefits and alternatives for the proposed anesthesia with the patient or authorized representative who has indicated his/her understanding and acceptance.   Dental advisory given  Plan Discussed with: CRNA and Surgeon  Anesthesia Plan Comments:         Anesthesia Quick Evaluation

## 2017-06-01 NOTE — Anesthesia Procedure Notes (Signed)
Procedure Name: MAC Date/Time: 06/01/2017 3:40 PM Performed by: Colin Benton, CRNA Pre-anesthesia Checklist: Patient identified, Emergency Drugs available, Suction available and Patient being monitored Patient Re-evaluated:Patient Re-evaluated prior to induction Oxygen Delivery Method: Nasal cannula Preoxygenation: Pre-oxygenation with 100% oxygen Induction Type: IV induction

## 2017-06-01 NOTE — Brief Op Note (Signed)
06/01/2017  4:36 PM  PATIENT:  Christine [name]  67 y.o. female  PRE-OPERATIVE DIAGNOSIS:  HALLUX LIMITUS  POST-OPERATIVE DIAGNOSIS:  HALLUX LIMITUS  PROCEDURE:  Procedure(s): CHILECTOMY RIGHT FOOT (Right)  SURGEON:  Surgeon(s) and Role:    Edrick Kins, DPM - Primary  PHYSICIAN ASSISTANT:   ASSISTANTS: none   ANESTHESIA:   local and IV sedation  EBL:  2 mL   BLOOD ADMINISTERED:none  DRAINS: none   LOCAL MEDICATIONS USED:  MARCAINE    and XYLOCAINE   SPECIMEN:  No Specimen  DISPOSITION OF SPECIMEN:  N/A  COUNTS:  YES  TOURNIQUET:   Total Tourniquet Time Documented: Calf (Right) - 20 minutes Total: Calf (Right) - 20 minutes   DICTATION: .Dragon Dictation  PLAN OF CARE: Discharge to home after PACU  PATIENT DISPOSITION:  PACU - hemodynamically stable.   Delay start of Pharmacological VTE agent (>24hrs) due to surgical blood loss or risk of bleeding: not applicable  Edrick Kins, DPM [facility]  Dr. Edrick Kins, DPM    [address], [place] [address]                Office [phone]  Fax [phone]

## 2017-06-02 ENCOUNTER — Encounter (HOSPITAL_COMMUNITY): Payer: Self-pay | Admitting: Podiatry

## 2017-06-08 ENCOUNTER — Encounter: Payer: Self-pay | Admitting: Podiatry

## 2017-06-08 ENCOUNTER — Ambulatory Visit (INDEPENDENT_AMBULATORY_CARE_PROVIDER_SITE_OTHER): Payer: BLUE CROSS/BLUE SHIELD

## 2017-06-08 ENCOUNTER — Ambulatory Visit (INDEPENDENT_AMBULATORY_CARE_PROVIDER_SITE_OTHER): Payer: BLUE CROSS/BLUE SHIELD | Admitting: Podiatry

## 2017-06-08 VITALS — BP 143/84 | HR 78 | Resp 18

## 2017-06-08 DIAGNOSIS — M205X1 Other deformities of toe(s) (acquired), right foot: Secondary | ICD-10-CM

## 2017-06-08 DIAGNOSIS — Z9889 Other specified postprocedural states: Secondary | ICD-10-CM

## 2017-06-12 NOTE — Progress Notes (Signed)
   Subjective:  Patient presents today status post cheilectomy right foot. DOS: 06/01/17. She states she is doing well. She reports significant pain for three days after surgery but denies any at this time. Patient is here for further evaluation and treatment.    Past Medical History:  Diagnosis Date  . Anemia   . Anxiety   . Arthritis    "knees" (04/26/2016)  . Coronary artery disease    mild per 2015 cath in [place] (OM1 30%, RCA 30%)  . GERD (gastroesophageal reflux disease)   . Gout   . History of hiatal hernia   . Hypertension   . Migraine    "none since early /2017" (05/06/2016)  . Obesity   . OSA on CPAP    uses CPAP  . Pre-diabetes       Objective/Physical Exam Neurovascular status intact.  Skin incisions appear to be well coapted with sutures and staples intact. No sign of infectious process noted. No dehiscence. No active bleeding noted. Moderate edema noted to the surgical extremity.  Radiographic Exam:  Osteotomies sites appear to be stable with routine healing.  Assessment: 1. s/p cheilectomy right foot. DOS: 06/01/17   Plan of Care:  1. Patient was evaluated. X-rays reviewed 2. Dressing changed. Keep clean, dry and intact for one week. 3. Continue weightbearing in post op shoe.  4. Return to clinic in 1 week for staple removal.    Edrick Kins, DPM [facility]  Dr. Edrick Kins, [address]                                        [place], [place] [address]                Office [phone]  Fax [phone]

## 2017-06-15 ENCOUNTER — Ambulatory Visit (INDEPENDENT_AMBULATORY_CARE_PROVIDER_SITE_OTHER): Payer: BLUE CROSS/BLUE SHIELD | Admitting: Podiatry

## 2017-06-15 ENCOUNTER — Ambulatory Visit: Payer: BLUE CROSS/BLUE SHIELD | Admitting: Family Medicine

## 2017-06-15 DIAGNOSIS — Z9889 Other specified postprocedural states: Secondary | ICD-10-CM

## 2017-06-15 DIAGNOSIS — M205X1 Other deformities of toe(s) (acquired), right foot: Secondary | ICD-10-CM

## 2017-06-16 ENCOUNTER — Ambulatory Visit: Payer: BLUE CROSS/BLUE SHIELD | Admitting: Family Medicine

## 2017-06-16 ENCOUNTER — Encounter: Payer: Self-pay | Admitting: Family Medicine

## 2017-06-16 VITALS — BP 124/76 | HR 78 | Temp 97.9°F | Ht 61.0 in | Wt 297.0 lb

## 2017-06-16 DIAGNOSIS — R1013 Epigastric pain: Secondary | ICD-10-CM

## 2017-06-16 DIAGNOSIS — Z9989 Dependence on other enabling machines and devices: Secondary | ICD-10-CM | POA: Diagnosis not present

## 2017-06-16 DIAGNOSIS — Z862 Personal history of diseases of the blood and blood-forming organs and certain disorders involving the immune mechanism: Secondary | ICD-10-CM

## 2017-06-16 DIAGNOSIS — G4733 Obstructive sleep apnea (adult) (pediatric): Secondary | ICD-10-CM | POA: Diagnosis not present

## 2017-06-16 MED ORDER — GI COCKTAIL ~~LOC~~
30.0000 mL | Freq: Once | ORAL | Status: AC
  Administered 2017-06-16: 30 mL via ORAL

## 2017-06-16 MED ORDER — OMEPRAZOLE 40 MG PO CPDR: 40.0000 mg | DELAYED_RELEASE_CAPSULE | Freq: Every day | ORAL | 5 refills | Status: DC

## 2017-06-16 MED ORDER — ALLOPURINOL 100 MG PO TABS: 100.0000 mg | ORAL_TABLET | Freq: Every day | ORAL | 6 refills | Status: DC

## 2017-06-16 NOTE — Progress Notes (Signed)
Pre visit review using our clinic review tool, if applicable. No additional management support is needed unless otherwise documented below in the visit note. 

## 2017-06-16 NOTE — Progress Notes (Signed)
Chief Complaint  Patient presents with  . Establish Care       New Patient Visit SUBJECTIVE: HPI: Christine [name] is an 67 y.o.female who is being seen for establishing care.  The patient was previously seen at office in [place].  The patient was told a couple years ago that she has anemia.  Her former PCP told her to take supplemental iron.  On recheck, she was continually told to take iron.  Her last colonoscopy was around 5 years ago.  She was not known to be anemic at that time.  She denies any areas of easy bruising or bleeding.  No blood in stool or dark tarry stools.  She has a history of OSA on CPAP.  She does not have a sleep specialist on here and believes that since gaining 30 pounds, she might need an adjustment to her settings.  She would like a referral.  She has also been having epigastric abdominal pain.  Her metoprolol was recently switched she started having abdominal pain.  Most of it has improved, but she is still having burning in her epigastric region.  No nausea, vomiting, bowel changes, or bleeding.  Allergies  Allergen Reactions  . Prednisone Swelling    Legs swell   . Tape   . Sulfa Antibiotics Rash   Past Medical History:  Diagnosis Date  . Anemia   . Anxiety   . Arthritis    "knees" (04/26/2016)  . Coronary artery disease    mild per 2015 cath in [place] (OM1 30%, RCA 30%)  . GERD (gastroesophageal reflux disease)   . Gout   . History of hiatal hernia   . Hypertension   . Migraine    "none since early /2017" (05/06/2016)  . Obesity   . OSA on CPAP    uses CPAP  . Pre-diabetes    Past Surgical History:  Procedure Laterality Date  . ABDOMINAL HYSTERECTOMY     "partial"  . APPENDECTOMY  05/06/2016  . CARDIAC CATHETERIZATION  ~ 2015  . CARDIAC CATHETERIZATION  2015   In [place]  . CHILECTOMY Right 06/01/2017   Procedure: CHILECTOMY RIGHT FOOT;  Surgeon: Edrick Kins, DPM;  Location: [facility];  Service: Podiatry;  Laterality: Right;  . LAPAROSCOPIC  APPENDECTOMY N/A 05/06/2016   Procedure: LAPAROSCOPIC APPENDECTOMY;  Surgeon: Donnie Mesa, [place];  Location: [facility] OR;  Service: General;  Laterality: N/A;  . TONSILLECTOMY     Social History   Socioeconomic History  . Marital status: Single  Tobacco Use  . Smoking status: Former Smoker    Packs/day: 0.12    Years: 5.00    Pack years: 0.60    Last attempt to quit: 1980    Years since quitting: 39.1  . Smokeless tobacco: Never Used  Substance and Sexual Activity  . Alcohol use: No  . Drug use: No  . Sexual activity: No   Family History  Problem Relation Age of Onset  . Diabetes Mother   . Heart disease Mother   . Diabetes Father   . Heart disease Father   . Diabetes Sister      Current Outpatient Medications:  .  acetaminophen (TYLENOL) 325 MG tablet, Take 2 tablets (650 mg total) by mouth every 6 (six) hours as needed for mild pain (or temp > 100)., Disp: , Rfl:  .  allopurinol (ZYLOPRIM) 100 MG tablet, Take 1 tablet (100 mg total) by mouth daily., Disp: 30 tablet, Rfl: 6 .  aspirin-acetaminophen-caffeine (Dexter) 195-093-26 MG  tablet, Take 2 tablets by mouth every 8 (eight) hours as needed for migraine., Disp: , Rfl:  .  Diphenhydramine-Acetaminophen (PERCOGESIC PO), Take 1 tablet by mouth daily as needed (pain). , Disp: , Rfl:  .  DULoxetine (CYMBALTA) 30 MG capsule, Take 30 mg by mouth daily., Disp: , Rfl:  .  fexofenadine-pseudoephedrine (ALLEGRA-D 24) 180-240 MG 24 hr tablet, Take 1 tablet by mouth daily as needed (allergies)., Disp: , Rfl:  .  ibuprofen (ADVIL,MOTRIN) 200 MG tablet, Take 400 mg by mouth every 8 (eight) hours as needed for moderate pain., Disp: , Rfl:  .  IRON PO, Take 1 tablet by mouth daily., Disp: , Rfl:  .  meloxicam (MOBIC) 15 MG tablet, Take 15 mg by mouth daily., Disp: , Rfl: 1 .  metoprolol (LOPRESSOR) 50 MG tablet, Take 50 mg by mouth daily., Disp: , Rfl:  .  oxyCODONE-acetaminophen (PERCOCET/ROXICET) 5-325 MG tablet, TAKE 1 TABLET EVERY  4 HOURS AS NEEDED FOR PAIN, Disp: , Rfl: 0 .  triamterene-hydrochlorothiazide (MAXZIDE) 75-50 MG tablet, Take 1 tablet by mouth every evening. , Disp: , Rfl: 0 .  omeprazole (PRILOSEC) 40 MG capsule, Take 1 capsule (40 mg total) by mouth daily., Disp: 30 capsule, Rfl: 5  No LMP recorded. Patient has had a hysterectomy.  ROS Cardiovascular: Denies chest pain  Respiratory: Denies dyspnea   OBJECTIVE: BP 124/76 (BP Location: Left Arm, Patient Position: Sitting, Cuff Size: Large)   Pulse 78   Temp 97.9 F (36.6 C) (Oral)   Ht 5\' 1"  (1.549 m)   Wt 297 lb (134.7 kg)   SpO2 99%   BMI 56.12 kg/m   Constitutional: -  VS reviewed -  Well developed, well nourished, appears stated age -  No apparent distress  Psychiatric: -  Oriented to person, place, and time -  Memory intact -  Affect and mood normal -  Fluent conversation, good eye contact -  Judgment and insight age appropriate  Eye: -  Conjunctivae clear, no discharge -  Pupils symmetric, round, reactive to light  ENMT: -  MMM    Pharynx moist, no exudate, no erythema  Neck: -  No gross swelling, no palpable masses -  Thyroid midline, not enlarged, mobile, no palpable masses  Cardiovascular: -  RRR -  No LE edema  Respiratory: -  Normal respiratory effort, no accessory muscle use, no retraction -  Breath sounds equal, no wheezes, no ronchi, no crackles  Gastrointestinal: -  Bowel sounds normal -  No tenderness, no distention, no guarding, no masses  Skin: -  No significant lesion on inspection -  Warm and dry to palpation   ASSESSMENT/PLAN: History of anemia - Plan: Ferritin, IBC panel, B12, Pathologist smear review, CBC w/Diff, Lactate Dehydrogenase (LDH)  OSA on CPAP - Plan: Ambulatory referral to Pulmonology  Epigastric abdominal pain - Plan: omeprazole (PRILOSEC) 40 MG capsule, gi cocktail (Maalox,Lidocaine,Donnatal)  Patient instructed to sign release of records form from her previous PCP. Ck some further labs, she  may need to see the GI team pending results.  Refer to sleep team.  GI cocktail, change PPI. Lose wt.  Patient should return in 6 mo or prn. The patient voiced understanding and agreement to the plan.   [place], DO 06/16/17  5:20 PM

## 2017-06-16 NOTE — Patient Instructions (Signed)
If you do not hear anything about your referral in the next 1-2 weeks, call our office and ask for an update.  We will send you a MyChart message regarding your lab results.   OK to stop pantoprazole.  Let us know if you need anything.

## 2017-06-17 LAB — CBC WITH DIFFERENTIAL/PLATELET
Basophils Absolute: 0.1 10*3/uL (ref 0.0–0.1)
Basophils Relative: 0.9 % (ref 0.0–3.0)
EOS PCT: 4.2 % (ref 0.0–5.0)
Eosinophils Absolute: 0.3 10*3/uL (ref 0.0–0.7)
HCT: 32.8 % — ABNORMAL LOW (ref 36.0–46.0)
Hemoglobin: 10.5 g/dL — ABNORMAL LOW (ref 12.0–15.0)
LYMPHS ABS: 2.5 10*3/uL (ref 0.7–4.0)
Lymphocytes Relative: 34.4 % (ref 12.0–46.0)
MCHC: 32 g/dL (ref 30.0–36.0)
MCV: 84.5 fl (ref 78.0–100.0)
MONOS PCT: 7.1 % (ref 3.0–12.0)
Monocytes Absolute: 0.5 10*3/uL (ref 0.1–1.0)
NEUTROS ABS: 3.9 10*3/uL (ref 1.4–7.7)
NEUTROS PCT: 53.4 % (ref 43.0–77.0)
PLATELETS: 312 10*3/uL (ref 150.0–400.0)
RBC: 3.88 Mil/uL (ref 3.87–5.11)
RDW: 17.3 % — AB (ref 11.5–15.5)
WBC: 7.3 10*3/uL (ref 4.0–10.5)

## 2017-06-17 LAB — FERRITIN: FERRITIN: 201.5 ng/mL (ref 10.0–291.0)

## 2017-06-17 LAB — VITAMIN B12: Vitamin B-12: 661 pg/mL (ref 211–911)

## 2017-06-17 LAB — IBC PANEL
IRON: 49 ug/dL (ref 42–145)
Saturation Ratios: 14.5 % — ABNORMAL LOW (ref 20.0–50.0)
Transferrin: 242 mg/dL (ref 212.0–360.0)

## 2017-06-17 LAB — PATHOLOGIST SMEAR REVIEW

## 2017-06-17 LAB — LACTATE DEHYDROGENASE: LDH: 144 U/L (ref 120–250)

## 2017-06-17 NOTE — Progress Notes (Signed)
   Subjective:  Patient presents today status post cheilectomy right foot. DOS: 06/01/17. She states the foot is doing well. She has no new complaints at this time. Patient is here for further evaluation and treatment.    Past Medical History:  Diagnosis Date  . Anemia   . Anxiety   . Arthritis    "knees" (04/26/2016)  . Coronary artery disease    mild per 2015 cath in [place] (OM1 30%, RCA 30%)  . GERD (gastroesophageal reflux disease)   . Gout   . History of hiatal hernia   . Hypertension   . Migraine    "none since early /2017" (05/06/2016)  . Obesity   . OSA on CPAP    uses CPAP  . Pre-diabetes       Objective/Physical Exam Neurovascular status intact.  Skin incisions appear to be well coapted with sutures and staples intact. No sign of infectious process noted. No dehiscence. No active bleeding noted. Moderate edema noted to the surgical extremity.   Assessment: 1. s/p cheilectomy right foot. DOS: 06/01/17   Plan of Care:  1. Patient was evaluated.  2. Staples removed. Dry sterile dressing applied.  3. Patient may begin washing foot.  4. Continue weightbearing in post op shoe.  5. Return to clinic in 2 weeks.  Edrick Kins, DPM [facility]  Dr. Edrick Kins, [address]                                        [place], [place] [address]                Office [phone]  Fax [phone]

## 2017-06-20 ENCOUNTER — Encounter: Payer: Self-pay | Admitting: Family Medicine

## 2017-06-20 ENCOUNTER — Other Ambulatory Visit: Payer: Self-pay | Admitting: Family Medicine

## 2017-06-20 DIAGNOSIS — D649 Anemia, unspecified: Secondary | ICD-10-CM

## 2017-06-21 ENCOUNTER — Encounter: Payer: Self-pay | Admitting: Gastroenterology

## 2017-06-22 ENCOUNTER — Encounter: Payer: Self-pay | Admitting: Family Medicine

## 2017-06-24 ENCOUNTER — Ambulatory Visit: Payer: BLUE CROSS/BLUE SHIELD | Admitting: Gastroenterology

## 2017-06-24 ENCOUNTER — Encounter: Payer: Self-pay | Admitting: Gastroenterology

## 2017-06-24 VITALS — BP 130/70 | HR 78 | Ht 62.0 in | Wt 297.0 lb

## 2017-06-24 DIAGNOSIS — R1013 Epigastric pain: Secondary | ICD-10-CM

## 2017-06-24 DIAGNOSIS — D649 Anemia, unspecified: Secondary | ICD-10-CM | POA: Diagnosis not present

## 2017-06-24 NOTE — Progress Notes (Signed)
[facility] Consult Note:  History: Christine [name] 06/24/2017  Referring physician: Shelda Pal, DO  Reason for consult/chief complaint: Anemia (Very tired, dx 1 year ago, on OTC iron, patient hasn't seen any bleeding.)   Subjective  HPI: This is a 67 year old woman referred by Dr. Nani [name] a primary care for anemia.  She reports that a previous primary care doctor, whose records are not currently available, told her a year ago she was anemic.  They put her on iron and she does not recall any further testing being done.  She recently saw Dr. Nani [name] as a new patient.  At that time, she was complaining of epigastric pain that started almost immediately after taking a new blood pressure medicine shortly after a foot surgery.  It happened again after she did another dose of it, so that medicine was stopped about 2 weeks later the epigastric pain resolved.  She has not taken Excedrin for years since her headaches resolved, and she takes rare NSAIDs.  She denies black or bloody stool.  Christine [name] says she had a colonoscopy and upper endoscopy done about 7 years ago out of this area, she cannot recall the indication of the findings.  She also recalls having been told a long time ago she was anemic prior to her hysterectomy.   ROS:  Review of Systems  Constitutional: Negative for appetite change and unexpected weight change.  HENT: Negative for mouth sores and voice change.   Eyes: Negative for pain and redness.  Respiratory: Negative for cough and shortness of breath.   Cardiovascular: Negative for chest pain and palpitations.  Genitourinary: Negative for dysuria and hematuria.  Musculoskeletal: Positive for arthralgias. Negative for myalgias.  Skin: Negative for pallor and rash.  Neurological: Negative for weakness and headaches.  Hematological: Negative for adenopathy.     Past Medical History: Past Medical History:  Diagnosis Date  . Anemia   . Anxiety    . Arthritis    "knees" (04/26/2016)  . Colon polyps    benign per pt  . Coronary artery disease    mild per 2015 cath in [place] (OM1 30%, RCA 30%)  . GERD (gastroesophageal reflux disease)   . Gout   . History of hiatal hernia   . Hypertension   . Migraine    "none since early /2017" (05/06/2016)  . Obesity   . OSA on CPAP    uses CPAP  . Pre-diabetes      Past Surgical History: Past Surgical History:  Procedure Laterality Date  . ABDOMINAL HYSTERECTOMY     "partial"  . CARDIAC CATHETERIZATION  ~ 2015  . CARDIAC CATHETERIZATION  2015   In [place]  . CHILECTOMY Right 06/01/2017   Procedure: CHILECTOMY RIGHT FOOT;  Surgeon: Edrick Kins, DPM;  Location: [facility];  Service: Podiatry;  Laterality: Right;  . LAPAROSCOPIC APPENDECTOMY N/A 05/06/2016   Procedure: LAPAROSCOPIC APPENDECTOMY;  Surgeon: Donnie Mesa, MD;  Location: [facility];  Service: General;  Laterality: N/A;  . TONSILLECTOMY       Family History: Family History  Problem Relation Age of Onset  . Diabetes Mother   . Heart disease Mother   . High blood pressure Mother   . Asthma Mother   . Diabetes Father   . Heart disease Father   . High blood pressure Father   . Asthma Father   . Diabetes Sister   . Colon cancer Neg Hx   . Esophageal cancer Neg Hx   . Rectal  cancer Neg Hx     Social History: Social History   Socioeconomic History  . Marital status: Single    Spouse name: None  . Number of children: 0  . Years of education: None  . Highest education level: None  Social Needs  . Financial resource strain: None  . Food insecurity - worry: None  . Food insecurity - inability: None  . Transportation needs - medical: None  . Transportation needs - non-medical: None  Occupational History  . Occupation: retired  Tobacco Use  . Smoking status: Former Smoker    Packs/day: 0.12    Years: 5.00    Pack years: 0.60    Types: Cigarettes    Last attempt to quit: 1980    Years since quitting: 39.1  .  Smokeless tobacco: Never Used  Substance and Sexual Activity  . Alcohol use: No  . Drug use: No  . Sexual activity: No  Other Topics Concern  . None  Social History Narrative  . None    Allergies: Allergies  Allergen Reactions  . Prednisone Swelling    Legs swell   . Tape   . Sulfa Antibiotics Rash    Outpatient Meds: Current Outpatient Medications  Medication Sig Dispense Refill  . acetaminophen (TYLENOL) 325 MG tablet Take 2 tablets (650 mg total) by mouth every 6 (six) hours as needed for mild pain (or temp > 100).    Marland Kitchen allopurinol (ZYLOPRIM) 100 MG tablet Take 1 tablet (100 mg total) by mouth daily. 30 tablet 6  . aspirin-acetaminophen-caffeine (EXCEDRIN MIGRAINE) 250-250-65 MG tablet Take 2 tablets by mouth every 8 (eight) hours as needed for migraine.    . Diphenhydramine-Acetaminophen (PERCOGESIC PO) Take 1 tablet by mouth daily as needed (pain).     . DULoxetine (CYMBALTA) 30 MG capsule Take 30 mg by mouth daily.    . fexofenadine-pseudoephedrine (ALLEGRA-D 24) 180-240 MG 24 hr tablet Take 1 tablet by mouth daily as needed (allergies).    Marland Kitchen ibuprofen (ADVIL,MOTRIN) 200 MG tablet Take 400 mg by mouth every 8 (eight) hours as needed for moderate pain.    . IRON PO Take 1 tablet by mouth daily.    . metoprolol (LOPRESSOR) 50 MG tablet Take 50 mg by mouth daily.    Marland Kitchen omeprazole (PRILOSEC) 40 MG capsule Take 1 capsule (40 mg total) by mouth daily. 30 capsule 5  . triamterene-hydrochlorothiazide (MAXZIDE) 75-50 MG tablet Take 1 tablet by mouth every evening.   0   No current facility-administered medications for this visit.       ___________________________________________________________________ Objective   Exam:  BP 130/70   Pulse 78   Ht 5\' 2"  (1.575 m)   Wt 297 lb (134.7 kg)   BMI 54.32 kg/m    General: this is a(n) morbidly obese woman with an antalgic gait  Eyes: sclera anicteric, no redness  ENT: oral mucosa moist without lesions, no cervical or  supraclavicular lymphadenopathy, good dentition  CV: RRR without murmur, S1/S2, no JVD, peripheral edema with brawny venous stasis changes  Resp: clear to auscultation bilaterally, normal RR and effort noted  GI: soft, no tenderness, with active bowel sounds. No guarding or palpable organomegaly noted.  Exam limited by BMI  Skin; warm and dry, no rash or jaundice noted  Neuro: awake, alert and oriented x 3. Normal gross motor function and fluent speech  Labs:  CBC Latest Ref Rng & Units 06/16/2017 05/30/2017 05/07/2016  WBC 4.0 - 10.5 K/uL 7.3 5.8 10.1  Hemoglobin  12.0 - 15.0 g/dL 10.5(L) 10.8(L) 9.5(L)  Hematocrit 36.0 - 46.0 % 32.8(L) 34.9(L) 29.8(L)  Platelets 150.0 - 400.0 K/uL 312.0 293 265   MCV 85, RDW 17 now.  MCV 84, nml RDW a year ago Iron studies and B12 normal   Assessment: Encounter Diagnoses  Name Primary?  . Normocytic anemia Yes  . Abdominal pain, epigastric     The patient is certainly not iron deficient at this point, it is not clear if she ever was. Overall, this anemia is not clearly from a source of GI blood loss.  Plan:  Hemoccult cards I recommend primary care consider hematology evaluation.  Thank you for the courtesy of this consult.  Please call me with any questions or concerns.  Nelida Meuse III  CC: Christine Pal, DO

## 2017-06-24 NOTE — Patient Instructions (Signed)
If you are age 65 or older, your body mass index should be between 23-30. Your Body mass index is 54.32 kg/m. If this is out of the aforementioned range listed, please consider follow up with your Primary Care Provider.  If you are age 31 or younger, your body mass index should be between 19-25. Your Body mass index is 54.32 kg/m. If this is out of the aformentioned range listed, please consider follow up with your Primary Care Provider.   Follow the instructions on the Hemoccult cards and mail them back to [place] when you are finished or you may take them directly to the lab in the basement of the [facility] building. We will call you with the results.   Thank you for choosing [facility]  Dr Wilfrid Lund III

## 2017-06-29 ENCOUNTER — Encounter: Payer: Self-pay | Admitting: Podiatry

## 2017-06-29 ENCOUNTER — Ambulatory Visit (INDEPENDENT_AMBULATORY_CARE_PROVIDER_SITE_OTHER): Payer: BLUE CROSS/BLUE SHIELD | Admitting: Podiatry

## 2017-06-29 ENCOUNTER — Ambulatory Visit (INDEPENDENT_AMBULATORY_CARE_PROVIDER_SITE_OTHER): Payer: BLUE CROSS/BLUE SHIELD

## 2017-06-29 DIAGNOSIS — M205X1 Other deformities of toe(s) (acquired), right foot: Secondary | ICD-10-CM | POA: Diagnosis not present

## 2017-06-29 DIAGNOSIS — Z79899 Other long term (current) drug therapy: Secondary | ICD-10-CM

## 2017-06-29 DIAGNOSIS — B351 Tinea unguium: Secondary | ICD-10-CM

## 2017-06-29 LAB — HEPATIC FUNCTION PANEL
AG Ratio: 1.2 (calc) (ref 1.0–2.5)
ALKALINE PHOSPHATASE (APISO): 79 U/L (ref 33–130)
ALT: 14 U/L (ref 6–29)
AST: 15 U/L (ref 10–35)
Albumin: 3.9 g/dL (ref 3.6–5.1)
BILIRUBIN DIRECT: 0.1 mg/dL (ref 0.0–0.2)
BILIRUBIN TOTAL: 0.5 mg/dL (ref 0.2–1.2)
Globulin: 3.2 g/dL (calc) (ref 1.9–3.7)
Indirect Bilirubin: 0.4 mg/dL (calc) (ref 0.2–1.2)
Total Protein: 7.1 g/dL (ref 6.1–8.1)

## 2017-06-29 MED ORDER — TERBINAFINE HCL 250 MG PO TABS: 250.0000 mg | ORAL_TABLET | Freq: Every day | ORAL | 0 refills | Status: DC

## 2017-07-03 NOTE — Progress Notes (Signed)
   Subjective:  Patient presents today status post cheilectomy right foot. DOS: 06/01/17. She states she experienced some pain that she related to gout two days ago but took Ibuprofen which helped alleviate the pain. She is currently wearing regular shoes. She denies any pain at this time. Patient is here for further evaluation and treatment.    Past Medical History:  Diagnosis Date  . Anemia   . Anxiety   . Arthritis    "knees" (04/26/2016)  . Colon polyps    benign per pt  . Coronary artery disease    mild per 2015 cath in [place] (OM1 30%, RCA 30%)  . GERD (gastroesophageal reflux disease)   . Gout   . History of hiatal hernia   . Hypertension   . Migraine    "none since early /2017" (05/06/2016)  . Obesity   . OSA on CPAP    uses CPAP  . Pre-diabetes       Objective/Physical Exam Neurovascular status intact.  Skin incisions appear to be well coapted. No sign of infectious process noted. No dehiscence. No active bleeding noted. Moderate edema noted to the surgical extremity. Hyperkeratotic, discolored, thickened, onychodystrophy of nails noted bilaterally.   Radiographic Exam:  Osteotomies sites appear to be stable with routine healing.  Assessment: 1. s/p cheilectomy right foot. DOS: 06/01/17 2. Onychomycosis bilateral nails 1-5   Plan of Care:  1. Patient was evaluated. X-Rays reviewed.  2. May resume full activity with no restrictions.  3. Resume wearing regular shoe gear. 4. Prescription for Lamisil #90 provided to patient.  5. Orders for liver function test placed today.  6. Appointment with Janett Billow for fungal nail laser treatment.  7. Return to clinic as needed.   Edrick Kins, DPM [facility]  Dr. Edrick Kins, [address]                                        [place], [place] [address]                Office [phone]  Fax [phone]

## 2017-07-04 ENCOUNTER — Encounter: Payer: Self-pay | Admitting: Family Medicine

## 2017-07-04 NOTE — Progress Notes (Signed)
Cheilectomy right foot.

## 2017-07-05 ENCOUNTER — Other Ambulatory Visit (INDEPENDENT_AMBULATORY_CARE_PROVIDER_SITE_OTHER): Payer: BLUE CROSS/BLUE SHIELD

## 2017-07-05 ENCOUNTER — Other Ambulatory Visit: Payer: Self-pay | Admitting: Family Medicine

## 2017-07-05 DIAGNOSIS — D649 Anemia, unspecified: Secondary | ICD-10-CM

## 2017-07-05 DIAGNOSIS — R1013 Epigastric pain: Secondary | ICD-10-CM | POA: Diagnosis not present

## 2017-07-05 LAB — HEMOCCULT SLIDES (X 3 CARDS)
Fecal Occult Blood: NEGATIVE
OCCULT 1: NEGATIVE
OCCULT 2: NEGATIVE
OCCULT 3: NEGATIVE
OCCULT 4: NEGATIVE
OCCULT 5: NEGATIVE

## 2017-07-06 ENCOUNTER — Encounter: Payer: Self-pay | Admitting: Hematology

## 2017-07-06 ENCOUNTER — Telehealth: Payer: Self-pay | Admitting: Hematology

## 2017-07-06 NOTE — Telephone Encounter (Signed)
Appt has been scheduled for the pt to see Dr. Irene Limbo on 4/2 at 10am. Pt aware to arrive 30 minutes early. Letter mailed.

## 2017-07-21 NOTE — Progress Notes (Addendum)
HEMATOLOGY/ONCOLOGY CONSULTATION NOTE  Date of Service: 07/26/2017  Patient Care Team: Shelda Pal, DO as PCP - General (Family Medicine)  CHIEF COMPLAINTS/PURPOSE OF CONSULTATION:   Normocytic Anemia   HISTORY OF PRESENTING ILLNESS:    Christine [name] is a wonderful 67 y.o. female who has been referred to [place] by her PCP, Dr. Rosita Kea for evaluation and management of  Normocytic Anemia.   She notes she was told she was Anemic for a little over a year now, in early 2018. She had 3 uterine fibroids 6-7 years ago and had significant menorrhagia and Hg dropped. She had a resulting hysterectomy. She has not had a colonoscopy due to lack of GI blood loss. Her colonoscopy and endoscopy from 7 years ago was clear.   Today she notes no new symptoms in the last year. She notes her fatigue has been up and down, worsening over the past 6 months. When she has a bad day she is exhausted and cannot function. She will have breakfast and she will sleep until the late afternoon. She notes she feels her thyroid has been off but she has had normal blood tests. 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 She notes today she had fasted in case it was required for her.   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 She denies known liver issues, excess alcohol use or family history of kidney issues. She has been on Protonix for her GERD for at least 10 years.  Other significant comorbidities include CAD with a 2015 Cardiac cath, HTN on metoprolol, Sleep apnea with CPAP, pre-diabetes, gout. She has been on allopurinol for 5-6 years which has controlled her gout. Only 1 flare since. She denies any family history of blood disorders.   She had appendectomy 2 years ago. After surgery she had b/l lower extremity edema and took fluid pills which lead to cellulitis. She was treated by ED. The pain persisted after  cellulitis resolved. She had was to participate in [facility] clinical trail about this, but had over 900 sed rate which she thinks is residual inflammation from her cellulitis.   On review of symptoms, pt denies night sweats. She denies skin breakdown or ulcers from her cellulitis. She has skin color change from cellulitis. She notes she has arthritis in her knees. She denies bone or abdominal pain, bowel changes or trouble with urination.     MEDICAL HISTORY:  Past Medical History:  Diagnosis Date  . Anemia   . Anxiety   . Arthritis    "knees" (04/26/2016)  . Colon polyps    benign per pt  . Coronary artery disease    mild per 2015 cath in [place] (OM1 30%, RCA 30%)  . GERD (gastroesophageal reflux disease)   . Gout   . History of hiatal hernia   . Hypertension   . Migraine    "none since early /2017" (05/06/2016)  . Obesity   . OSA on CPAP    uses CPAP  . Pre-diabetes     SURGICAL HISTORY: Past Surgical History:  Procedure Laterality Date  . ABDOMINAL HYSTERECTOMY     "partial"  . CARDIAC CATHETERIZATION  ~ 2015  . CARDIAC CATHETERIZATION  2015   In [place]  . CHILECTOMY Right 06/01/2017   Procedure: CHILECTOMY RIGHT FOOT;  Surgeon: Edrick Kins, DPM;  Location: [facility];  Service: Podiatry;  Laterality: Right;  .  LAPAROSCOPIC APPENDECTOMY N/A 05/06/2016   Procedure: LAPAROSCOPIC APPENDECTOMY;  Surgeon: Donnie Mesa, MD;  Location: [facility];  Service: General;  Laterality: N/A;  . TONSILLECTOMY      SOCIAL HISTORY: Social History   Socioeconomic History  . Marital status: Single    Spouse name: Not on file  . Number of children: 0  . Years of education: Not on file  . Highest education level: Not on file  Occupational History  . Occupation: retired  Scientific laboratory technician  . Financial resource strain: Not on file  . Food insecurity:    Worry: Not on file    Inability: Not on file  . Transportation needs:    Medical: Not on file    Non-medical: Not on file  Tobacco Use  .  Smoking status: Former Smoker    Packs/day: 0.12    Years: 5.00    Pack years: 0.60    Types: Cigarettes    Last attempt to quit: 1980    Years since quitting: 39.2  . Smokeless tobacco: Never Used  Substance and Sexual Activity  . Alcohol use: No  . Drug use: No  . Sexual activity: Never  Lifestyle  . Physical activity:    Days per week: Not on file    Minutes per session: Not on file  . Stress: Not on file  Relationships  . Social connections:    Talks on phone: Not on file    Gets together: Not on file    Attends religious service: Not on file    Active member of club or organization: Not on file    Attends meetings of clubs or organizations: Not on file    Relationship status: Not on file  . Intimate partner violence:    Fear of current or ex partner: Not on file    Emotionally abused: Not on file    Physically abused: Not on file    Forced sexual activity: Not on file  Other Topics Concern  . Not on file  Social History Narrative  . Not on file    FAMILY HISTORY: Family History  Problem Relation Age of Onset  . Diabetes Mother   . Heart disease Mother   . High blood pressure Mother   . Asthma Mother   . Diabetes Father   . Heart disease Father   . High blood pressure Father   . Asthma Father   . Diabetes Sister   . Colon cancer Neg Hx   . Esophageal cancer Neg Hx   . Rectal cancer Neg Hx     ALLERGIES:  is allergic to prednisone; tape; and sulfa antibiotics.  MEDICATIONS:  Current Outpatient Medications  Medication Sig Dispense Refill  . acetaminophen (TYLENOL) 325 MG tablet Take 2 tablets (650 mg total) by mouth every 6 (six) hours as needed for mild pain (or temp > 100).    Marland Kitchen allopurinol (ZYLOPRIM) 100 MG tablet Take 1 tablet (100 mg total) by mouth daily. 30 tablet 6  . aspirin-acetaminophen-caffeine (EXCEDRIN MIGRAINE) 250-250-65 MG tablet Take 2 tablets by mouth every 8 (eight) hours as needed for migraine.    . Diphenhydramine-Acetaminophen  (PERCOGESIC PO) Take 1 tablet by mouth daily as needed (pain).     . DULoxetine (CYMBALTA) 30 MG capsule Take 30 mg by mouth daily.    . fexofenadine (ALLEGRA) 180 MG tablet Take 180 mg by mouth daily.    Marland Kitchen ibuprofen (ADVIL,MOTRIN) 200 MG tablet Take 400 mg by mouth every 8 (eight) hours  as needed for moderate pain.    . metoprolol (LOPRESSOR) 50 MG tablet Take 50 mg by mouth daily.    Marland Kitchen omeprazole (PRILOSEC) 40 MG capsule Take 1 capsule (40 mg total) by mouth daily. 30 capsule 5  . terbinafine (LAMISIL) 250 MG tablet Take 1 tablet (250 mg total) by mouth daily. 90 tablet 0  . triamterene-hydrochlorothiazide (MAXZIDE) 75-50 MG tablet Take 1 tablet by mouth every evening.   0   No current facility-administered medications for this visit.     REVIEW OF SYSTEMS:    10 Point review of Systems was done is negative except as noted above.  PHYSICAL EXAMINATION: ECOG PERFORMANCE STATUS: 0 - Asymptomatic  . Vitals:   07/26/17 0952  BP: 126/79  Pulse: 78  Resp: 18  Temp: 97.6 F (36.4 C)  SpO2: 100%   Filed Weights   07/26/17 0952  Weight: 293 lb 14.4 oz (133.3 kg)   .Body mass index is 53.76 kg/m.  GENERAL:alert, in no acute distress and comfortable SKIN: no acute rashes, no significant lesions (+) skin hyperpigmentation of b/l LE, lower calves to ankles, with loss of underlying soft tissue. EYES: conjunctiva are pink and non-injected, sclera anicteric OROPHARYNX: MMM, no exudates, no oropharyngeal erythema or ulceration NECK: supple, no JVD LYMPH:  no palpable lymphadenopathy in the cervical, axillary or inguinal regions LUNGS: clear to auscultation b/l with normal respiratory effort HEART: regular rate & rhythm ABDOMEN:  normoactive bowel sounds , non tender, not distended. Extremity: (+) mild b/l ankle swelling  PSYCH: alert & oriented x 3 with fluent speech NEURO: no focal motor/sensory deficits  LABORATORY DATA:  I have reviewed the data as listed  . CBC Latest Ref  Rng & Units 07/26/2017 06/16/2017 05/30/2017  WBC 3.9 - 10.3 K/uL 5.8 7.3 5.8  Hemoglobin 12.0 - 15.0 g/dL - 10.5(L) 10.8(L)  Hematocrit 34.8 - 46.6 % 33.7(L) 32.8(L) 34.9(L)  Platelets 145 - 400 K/uL 261 312.0 293  HGB 10.5  .Marland Kitchen CMP Latest Ref Rng & Units 07/26/2017 06/29/2017 05/30/2017  Glucose 70 - 140 mg/dL 91 - 87  BUN 7 - 26 mg/dL 26 - 24(H)  Creatinine 0.60 - 1.10 mg/dL 0.91 - 0.89  Sodium 136 - 145 mmol/L 140 - 138  Potassium 3.5 - 5.1 mmol/L 3.6 - 3.5  Chloride 98 - 109 mmol/L 108 - 104  CO2 22 - 29 mmol/L 24 - 24  Calcium 8.4 - 10.4 mg/dL 9.9 - 9.3  Total Protein 6.4 - 8.3 g/dL 7.9 7.1 -  Total Bilirubin 0.2 - 1.2 mg/dL 0.5 0.5 -  Alkaline Phos 40 - 150 U/L 86 - -  AST 5 - 34 U/L 15 15 -  ALT 0 - 55 U/L 14 14 -      Component     Latest Ref Rng & Units 07/26/2017  WBC     3.9 - 10.3 K/uL 5.8  RBC     3.70 - 5.45 MIL/uL 3.93  Hemoglobin     11.6 - 15.9 g/dL 10.5 (L)  HCT     34.8 - 46.6 % 33.7 (L)  MCV     79.5 - 101.0 fL 85.8  MCH     25.1 - 34.0 pg 26.7  MCHC     31.5 - 36.0 g/dL 31.2 (L)  RDW     11.2 - 14.5 % 16.5 (H)  Platelets     145 - 400 K/uL 261  Neutrophils     % 45  NEUT#  1.5 - 6.5 K/uL 2.7  Lymphocytes     % 43  Lymphocyte #     0.9 - 3.3 K/uL 2.5  Monocytes Relative     % 8  Monocyte #     0.1 - 0.9 K/uL 0.5  Eosinophil     % 3  Eosinophils Absolute     0.0 - 0.5 K/uL 0.2  Basophil     % 1  Basophils Absolute     0.0 - 0.1 K/uL 0.0  IgG (Immunoglobin G), Serum     700 - 1,600 mg/dL 1,566  IgA     87 - 352 mg/dL 215  IgM (Immunoglobulin M), Srm     26 - 217 mg/dL 43  Total Protein ELP     6.0 - 8.5 g/dL 7.1  Albumin SerPl Elph-Mcnc     2.9 - 4.4 g/dL 3.3  Alpha 1     0.0 - 0.4 g/dL 0.3  Alpha2 Glob SerPl Elph-Mcnc     0.4 - 1.0 g/dL 0.8  B-Globulin SerPl Elph-Mcnc     0.7 - 1.3 g/dL 1.3  Gamma Glob SerPl Elph-Mcnc     0.4 - 1.8 g/dL 1.4  M Protein SerPl Elph-Mcnc     Not Observed g/dL Not Observed  Globulin, Total      2.2 - 3.9 g/dL 3.8  Albumin/Glob SerPl     0.7 - 1.7 0.9  IFE 1      Comment  Please Note (HCV):      Comment  Iron     41 - 142 ug/dL 40 (L)  TIBC     236 - 444 ug/dL 320  Saturation Ratios     21 - 57 % 12 (L)  UIBC     ug/dL 280  Retic Ct Pct     0.7 - 2.1 % 1.8  RBC.     3.70 - 5.45 MIL/uL 3.93  Retic Count, Absolute     33.7 - 90.7 K/uL 70.7  Ferritin     9 - 269 ng/mL 175  CRP     <1.0 mg/dL 4.4 (H)   Component     Latest Ref Rng & Units 07/26/2017  TSH     0.450 - 4.500 uIU/mL 1.680  Thyroxine (T4)     4.5 - 12.0 ug/dL 7.3  T3 Uptake Ratio     24 - 39 % 26  Free Thyroxine Index     1.2 - 4.9 1.9  Kappa free light chain     3.3 - 19.4 mg/L 27.5 (H)  Lamda free light chains     5.7 - 26.3 mg/L 15.5  Kappa, lamda light chain ratio     0.26 - 1.65 1.77 (H)  Transferrin Receptor     12.2 - 27.3 nmol/L 20.8  Erythropoietin     2.6 - 18.5 mIU/mL 20.8 (H)  Sed Rate     0 - 22 mm/hr 77 (H)  Haptoglobin     34 - 200 mg/dL 297 (H)  LDH     125 - 245 U/L 163   RADIOGRAPHIC STUDIES: I have personally reviewed the radiological images as listed and agreed with the findings in the report. Dg Foot Complete Right  Result Date: 06/29/2017 Please see detailed radiograph report in office note.   ASSESSMENT & PLAN:  Bradee Common is a 67 y.o. female with    1. Normocytic Anemia  Hg ranging 10.8 - 9.5, mild with normal size  CRP and sed rate  elevated suggesting chronic nflammation. Also with low EPO level Functional iron deficiency with nl ferritin and low iron saturation. . Lab Results  Component Value Date   IRON 40 (L) 07/26/2017   TIBC 320 07/26/2017   IRONPCTSAT 12 (L) 07/26/2017   (Iron and TIBC)  Lab Results  Component Value Date   FERRITIN 175 07/26/2017   Normal SPEP. NL bilirubin , LDH, haptoglobin and retic count suggest against hemolysis   2. Significant Fatigue  -worsened over the past 6 months TSH WNL -I encouraged her to see her PCP  for further workup    3. H/o Cellulitis of b/l LE -has hyperpigmentation of b/l LE with loss of soft tissue and Mild edema   PLAN:  -We discussed her past medical history, comorbidities and medication history that can effect her hemoglobin.  -I reviewed her previous labs with pt which show mild anemia with normal size cells.  -Based on our discussion I suspect anemia secondary to inflammation or of chronic disease.  Would recommended PCP consider PO iron replacement with Iron polysaccharide 12mpo daily to try to target ferritin > 100 AND Iron saturation >20% -lab w/u ordered as noted below.  . Orders Placed This Encounter  Procedures  . CMP ([place])    Standing Status:   Future    Number of Occurrences:   1    Standing Expiration Date:   07/27/2018  . Lactate dehydrogenase    Standing Status:   Future    Number of Occurrences:   1    Standing Expiration Date:   07/27/2018  . Haptoglobin    Standing Status:   Future    Number of Occurrences:   1    Standing Expiration Date:   07/27/2018  . Sedimentation rate    Standing Status:   Future    Number of Occurrences:   1    Standing Expiration Date:   07/26/2018  . C-reactive protein    Standing Status:   Future    Number of Occurrences:   1    Standing Expiration Date:   07/26/2018  . Multiple Myeloma Panel (SPEP&IFE w/QIG)    Standing Status:   Future    Number of Occurrences:   1    Standing Expiration Date:   07/27/2018  . Kappa/lambda light chains    Standing Status:   Future    Number of Occurrences:   1    Standing Expiration Date:   08/30/2018  . Erythropoietin    Standing Status:   Future    Number of Occurrences:   1    Standing Expiration Date:   07/26/2018  . Thyroid Panel With TSH    Standing Status:   Future    Number of Occurrences:   1    Standing Expiration Date:   07/26/2018  . Ferritin    Standing Status:   Future    Number of Occurrences:   1    Standing Expiration Date:   07/26/2018  . Iron and TIBC     Standing Status:   Future    Number of Occurrences:   1    Standing Expiration Date:   07/26/2018  . Soluble transferrin receptor    Standing Status:   Future    Number of Occurrences:   1    Standing Expiration Date:   07/26/2018    Labs today  RTC with Dr KIrene Limboas needed   All of the patients questions were answered with  apparent satisfaction. The patient knows to call the clinic with any problems, questions or concerns.  I spent 35 minutes counseling the patient face to face. The total time spent in the appointment was 45 minutes and more than 50% was on counseling and direct patient cares.    Sullivan Lone MD [place] AAHIVMS [facility] Hematology/Oncology Physician [facility]  (Office):       [phone] (Work cell):  [phone] (Fax):           [phone]  07/26/2017 10:59 AM  This document serves as a record of services personally performed by Sullivan Lone, MD. It was created on his behalf by Joslyn Devon, a trained medical scribe. The creation of this record is based on the scribe's personal observations and the provider's statements to them.    .I have reviewed the above documentation for accuracy and completeness, and I agree with the above. Brunetta Genera MD

## 2017-07-26 ENCOUNTER — Encounter: Payer: Self-pay | Admitting: Hematology

## 2017-07-26 ENCOUNTER — Inpatient Hospital Stay: Payer: BLUE CROSS/BLUE SHIELD | Attending: Hematology | Admitting: Hematology

## 2017-07-26 ENCOUNTER — Inpatient Hospital Stay: Payer: BLUE CROSS/BLUE SHIELD

## 2017-07-26 VITALS — BP 126/79 | HR 78 | Temp 97.6°F | Resp 18 | Ht 62.0 in | Wt 293.9 lb

## 2017-07-26 DIAGNOSIS — D649 Anemia, unspecified: Secondary | ICD-10-CM | POA: Diagnosis present

## 2017-07-26 DIAGNOSIS — R5383 Other fatigue: Secondary | ICD-10-CM | POA: Diagnosis present

## 2017-07-26 DIAGNOSIS — R609 Edema, unspecified: Secondary | ICD-10-CM | POA: Insufficient documentation

## 2017-07-26 DIAGNOSIS — Z87891 Personal history of nicotine dependence: Secondary | ICD-10-CM | POA: Diagnosis not present

## 2017-07-26 DIAGNOSIS — Z872 Personal history of diseases of the skin and subcutaneous tissue: Secondary | ICD-10-CM | POA: Insufficient documentation

## 2017-07-26 LAB — IRON AND TIBC
Iron: 40 ug/dL — ABNORMAL LOW (ref 41–142)
SATURATION RATIOS: 12 % — AB (ref 21–57)
TIBC: 320 ug/dL (ref 236–444)
UIBC: 280 ug/dL

## 2017-07-26 LAB — CMP (CANCER CENTER ONLY)
ALBUMIN: 3.5 g/dL (ref 3.5–5.0)
ALK PHOS: 86 U/L (ref 40–150)
ALT: 14 U/L (ref 0–55)
ANION GAP: 8 (ref 3–11)
AST: 15 U/L (ref 5–34)
BILIRUBIN TOTAL: 0.5 mg/dL (ref 0.2–1.2)
BUN: 26 mg/dL (ref 7–26)
CALCIUM: 9.9 mg/dL (ref 8.4–10.4)
CO2: 24 mmol/L (ref 22–29)
Chloride: 108 mmol/L (ref 98–109)
Creatinine: 0.91 mg/dL (ref 0.60–1.10)
GFR, Est AFR Am: >60 mL/min (ref >60)
GFR, Estimated: >60 mL/min (ref >60)
GLUCOSE: 91 mg/dL (ref 70–140)
POTASSIUM: 3.6 mmol/L (ref 3.5–5.1)
Sodium: 140 mmol/L (ref 136–145)
TOTAL PROTEIN: 7.9 g/dL (ref 6.4–8.3)

## 2017-07-26 LAB — CBC WITH DIFFERENTIAL (CANCER CENTER ONLY)
Basophils Absolute: 0 10*3/uL (ref 0.0–0.1)
Basophils Relative: 1 %
EOS PCT: 3 %
Eosinophils Absolute: 0.2 10*3/uL (ref 0.0–0.5)
HEMATOCRIT: 33.7 % — AB (ref 34.8–46.6)
Hemoglobin: 10.5 g/dL — ABNORMAL LOW (ref 11.6–15.9)
LYMPHS PCT: 43 %
Lymphs Abs: 2.5 10*3/uL (ref 0.9–3.3)
MCH: 26.7 pg (ref 25.1–34.0)
MCHC: 31.2 g/dL — AB (ref 31.5–36.0)
MCV: 85.8 fL (ref 79.5–101.0)
MONO ABS: 0.5 10*3/uL (ref 0.1–0.9)
Monocytes Relative: 8 %
NEUTROS ABS: 2.7 10*3/uL (ref 1.5–6.5)
Neutrophils Relative %: 45 %
Platelet Count: 261 10*3/uL (ref 145–400)
RBC: 3.93 MIL/uL (ref 3.70–5.45)
RDW: 16.5 % — AB (ref 11.2–14.5)
WBC Count: 5.8 10*3/uL (ref 3.9–10.3)

## 2017-07-26 LAB — SEDIMENTATION RATE: Sed Rate: 77 mm/hr — ABNORMAL HIGH (ref 0–22)

## 2017-07-26 LAB — RETICULOCYTES
RBC.: 3.93 MIL/uL (ref 3.70–5.45)
RETIC COUNT ABSOLUTE: 70.7 10*3/uL (ref 33.7–90.7)
RETIC CT PCT: 1.8 % (ref 0.7–2.1)

## 2017-07-26 LAB — FERRITIN: Ferritin: 175 ng/mL (ref 9–269)

## 2017-07-26 LAB — LACTATE DEHYDROGENASE: LDH: 163 U/L (ref 125–245)

## 2017-07-26 LAB — C-REACTIVE PROTEIN: CRP: 4.4 mg/dL — AB (ref <1.0)

## 2017-07-27 LAB — KAPPA/LAMBDA LIGHT CHAINS
KAPPA, LAMDA LIGHT CHAIN RATIO: 1.77 — AB (ref 0.26–1.65)
Kappa free light chain: 27.5 mg/L — ABNORMAL HIGH (ref 3.3–19.4)
Lambda free light chains: 15.5 mg/L (ref 5.7–26.3)

## 2017-07-27 LAB — THYROID PANEL WITH TSH
FREE THYROXINE INDEX: 1.9 (ref 1.2–4.9)
T3 Uptake Ratio: 26 % (ref 24–39)
T4, Total: 7.3 ug/dL (ref 4.5–12.0)
TSH: 1.68 u[IU]/mL (ref 0.450–4.500)

## 2017-07-27 LAB — HAPTOGLOBIN: HAPTOGLOBIN: 297 mg/dL — AB (ref 34–200)

## 2017-07-27 LAB — ERYTHROPOIETIN: Erythropoietin: 20.8 m[IU]/mL — ABNORMAL HIGH (ref 2.6–18.5)

## 2017-07-28 LAB — SOLUBLE TRANSFERRIN RECEPTOR: TRANSFERRIN RECEPTOR: 20.8 nmol/L (ref 12.2–27.3)

## 2017-07-28 LAB — MULTIPLE MYELOMA PANEL, SERUM
ALBUMIN/GLOB SERPL: 0.9 (ref 0.7–1.7)
ALPHA 1: 0.3 g/dL (ref 0.0–0.4)
Albumin SerPl Elph-Mcnc: 3.3 g/dL (ref 2.9–4.4)
Alpha2 Glob SerPl Elph-Mcnc: 0.8 g/dL (ref 0.4–1.0)
B-Globulin SerPl Elph-Mcnc: 1.3 g/dL (ref 0.7–1.3)
Gamma Glob SerPl Elph-Mcnc: 1.4 g/dL (ref 0.4–1.8)
Globulin, Total: 3.8 g/dL (ref 2.2–3.9)
IGA: 215 mg/dL (ref 87–352)
IGM (IMMUNOGLOBULIN M), SRM: 43 mg/dL (ref 26–217)
IgG (Immunoglobin G), Serum: 1566 mg/dL (ref 700–1600)
TOTAL PROTEIN ELP: 7.1 g/dL (ref 6.0–8.5)

## 2017-08-03 NOTE — Addendum Note (Signed)
Addended by: Sullivan Lone on: 08/03/2017 01:27 AM   Modules accepted: Level of Service

## 2017-08-04 ENCOUNTER — Encounter: Payer: Self-pay | Admitting: Family Medicine

## 2017-08-06 NOTE — Telephone Encounter (Signed)
Let pt know that this isn't very specific and I don't think she needs to be on something. Her white blood cell count is normal and another marker of inflammation (ferritin) was normal. If she would like to discuss further, please schedule appt. She could also send Dr. Irene Limbo a note as he is the provider who ordered it. TY.

## 2017-08-08 ENCOUNTER — Encounter: Payer: Self-pay | Admitting: Hematology

## 2017-08-15 ENCOUNTER — Ambulatory Visit: Payer: BLUE CROSS/BLUE SHIELD | Admitting: Pulmonary Disease

## 2017-08-15 ENCOUNTER — Encounter: Payer: Self-pay | Admitting: Pulmonary Disease

## 2017-08-15 VITALS — BP 128/82 | HR 109 | Ht 62.5 in | Wt 293.0 lb

## 2017-08-15 DIAGNOSIS — Z6841 Body Mass Index (BMI) 40.0 and over, adult: Secondary | ICD-10-CM

## 2017-08-15 DIAGNOSIS — G4733 Obstructive sleep apnea (adult) (pediatric): Secondary | ICD-10-CM

## 2017-08-15 NOTE — Progress Notes (Signed)
   Subjective:    Patient ID: Christine [name], female    DOB: 03/16/1951, 67 y.o.   MRN: [id number]  HPI    Review of Systems  Constitutional: Negative for fever and unexpected weight change.  HENT: Negative for congestion, dental problem, ear pain, nosebleeds, postnasal drip, rhinorrhea, sinus pressure, sneezing, sore throat and trouble swallowing.   Eyes: Negative for redness and itching.  Respiratory: Negative for cough, chest tightness, shortness of breath and wheezing.   Cardiovascular: Negative for palpitations and leg swelling.  Gastrointestinal: Negative for nausea and vomiting.  Genitourinary: Negative for dysuria.  Musculoskeletal: Negative for joint swelling.  Skin: Negative for rash.  Allergic/Immunologic: Positive for environmental allergies. Negative for food allergies and immunocompromised state.  Neurological: Negative for headaches.  Hematological: Does not bruise/bleed easily.  Psychiatric/Behavioral: Negative for dysphoric mood. The patient is nervous/anxious.        Objective:   Physical Exam        Assessment & Plan:

## 2017-08-15 NOTE — Progress Notes (Signed)
[facility]  Chief Complaint  Patient presents with  . New Consult    OSA, already on CPAP     Vital signs: BP 128/82 (BP Location: Left Arm, Cuff Size: Normal)   Pulse (!) 109   Ht 5' 2.5" (1.588 m)   Wt 293 lb (132.9 kg)   SpO2 97%   BMI 52.74 kg/m   History of Present Illness: Christine [name] is a 67 y.o. female for evaluation of sleep problems.  She had a sleep study in [place] about 6 yrs ago.  Found to have sleep apnea and set up with CPAP.  Hasn't been set up with DME since moving to [place] 3 yrs ago.  Machine is waring out, and needs new supplies.  When not using CPAP she snores and stops breathing while asleep.  She has more trouble sleeping on her back and gets vivid dreams.  She goes to sleep at 10 pm.  She falls asleep after 30 minutes.  She wakes up 1 or 2 times to use the bathroom.  She gets out of bed between 6 and 10 am.  She feels tired in the morning.  She gets morning headache sometimes.  She does not use anything to help her fall sleep or stay awake.  She denies sleep walking, sleep talking, bruxism, or nightmares.  There is no history of restless legs.  She denies sleep hallucinations, sleep paralysis, or cataplexy.  The Epworth score is 12 out of 24.    Physical Exam:  General - pleasant Eyes - pupils reactive ENT - no sinus tenderness, no oral exudate, no LAN, MP 3 Cardiac - regular, no murmur Chest - no wheeze, rales Abd - soft, non tender Ext - no edema Skin - no rashes Neuro - normal strength Psych - normal mood  Discussion: She has snoring, sleep disruption, witnessed apnea and daytime sleepiness.  She has hx of sleep apnea and needs new set up.  She has hx of CAD, and HTN.    We discussed how sleep apnea can affect various health problems, including risks for hypertension, cardiovascular disease, and diabetes.  We also discussed how sleep disruption can increase risks for accidents, such as while driving.   Weight loss as a means of improving sleep apnea was also reviewed.  Additional treatment options discussed were CPAP therapy, oral appliance, and surgical intervention.   Assessment/Plan:  Obstructive sleep apnea - will arrange for home sleep study to assess current status of sleep apnea and then likely arrange for new auto CPAP  Obesity. - discussed importance of weight loss   Patient Instructions  Will arrange for home sleep study Will call to arrange for follow up after sleep study reviewed     Chesley Mires, MD [place] 08/15/2017, 11:36 AM  Flow Sheet  Sleep tests:  Review of Systems: Constitutional: Negative for fever and unexpected weight change.  HENT: Negative for congestion, dental problem, ear pain, nosebleeds, postnasal drip, rhinorrhea, sinus pressure, sneezing, sore throat and trouble swallowing.   Eyes: Negative for redness and itching.  Respiratory: Negative for cough, chest tightness, shortness of breath and wheezing.   Cardiovascular: Negative for palpitations and leg swelling.  Gastrointestinal: Negative for nausea and vomiting.  Genitourinary: Negative for dysuria.  Musculoskeletal: Negative for joint swelling.  Skin: Negative for rash.  Allergic/Immunologic: Positive for environmental allergies. Negative for food allergies and immunocompromised state.  Neurological: Negative for headaches.  Hematological: Does not bruise/bleed easily.  Psychiatric/Behavioral: Negative  for dysphoric mood. The patient is nervous/anxious.    Past Medical History: She  has a past medical history of Anemia, Anxiety, Arthritis, Colon polyps, Coronary artery disease, GERD (gastroesophageal reflux disease), Gout, History of hiatal hernia, Hypertension, Migraine, Obesity, OSA on CPAP, and Pre-diabetes.  Past Surgical History: She  has a past surgical history that includes Tonsillectomy; Abdominal hysterectomy; Cardiac catheterization (~ 2015); laparoscopic  appendectomy (N/A, 05/06/2016); Cardiac catheterization (2015); and Chilectomy (Right, 06/01/2017).  Family History: Her family history includes Asthma in her father and mother; Diabetes in her father, mother, and sister; Heart disease in her father and mother; High blood pressure in her father and mother.  Social History: She  reports that she quit smoking about 39 years ago. Her smoking use included cigarettes. She has a 0.60 pack-year smoking history. She has never used smokeless tobacco. She reports that she does not drink alcohol or use drugs.  Medications: Allergies as of 08/15/2017      Reactions   Prednisone Swelling   Legs swell    Tape    Sulfa Antibiotics Rash      Medication List        Accurate as of 08/15/17 11:36 AM. Always use your most recent med list.          acetaminophen 325 MG tablet Commonly known as:  TYLENOL Take 2 tablets (650 mg total) by mouth every 6 (six) hours as needed for mild pain (or temp > 100).   allopurinol 100 MG tablet Commonly known as:  ZYLOPRIM Take 1 tablet (100 mg total) by mouth daily.   aspirin-acetaminophen-caffeine 250-250-65 MG tablet Commonly known as:  EXCEDRIN MIGRAINE Take 2 tablets by mouth every 8 (eight) hours as needed for migraine.   DULoxetine 30 MG capsule Commonly known as:  CYMBALTA Take 30 mg by mouth daily.   fexofenadine 180 MG tablet Commonly known as:  ALLEGRA Take 180 mg by mouth daily.   ibuprofen 200 MG tablet Commonly known as:  ADVIL,MOTRIN Take 400 mg by mouth every 8 (eight) hours as needed for moderate pain.   metoprolol tartrate 50 MG tablet Commonly known as:  LOPRESSOR Take 50 mg by mouth daily.   omeprazole 40 MG capsule Commonly known as:  PRILOSEC Take 1 capsule (40 mg total) by mouth daily.   PERCOGESIC PO Take 1 tablet by mouth daily as needed (pain).   terbinafine 250 MG tablet Commonly known as:  LAMISIL Take 1 tablet (250 mg total) by mouth daily.     triamterene-hydrochlorothiazide 75-50 MG tablet Commonly known as:  MAXZIDE Take 1 tablet by mouth every evening.

## 2017-08-15 NOTE — Patient Instructions (Signed)
Will arrange for home sleep study Will call to arrange for follow up after sleep study reviewed  

## 2017-08-26 ENCOUNTER — Other Ambulatory Visit: Payer: BLUE CROSS/BLUE SHIELD

## 2017-08-31 DIAGNOSIS — G4733 Obstructive sleep apnea (adult) (pediatric): Secondary | ICD-10-CM | POA: Diagnosis not present

## 2017-09-01 ENCOUNTER — Other Ambulatory Visit: Payer: Self-pay | Admitting: *Deleted

## 2017-09-01 DIAGNOSIS — G4733 Obstructive sleep apnea (adult) (pediatric): Secondary | ICD-10-CM

## 2017-09-12 ENCOUNTER — Telehealth: Payer: Self-pay | Admitting: Pulmonary Disease

## 2017-09-12 DIAGNOSIS — G4733 Obstructive sleep apnea (adult) (pediatric): Secondary | ICD-10-CM | POA: Diagnosis not present

## 2017-09-12 DIAGNOSIS — Z9989 Dependence on other enabling machines and devices: Principal | ICD-10-CM

## 2017-09-12 NOTE — Telephone Encounter (Signed)
Called and spoke with patient regarding results.  Informed the patient of results and recommendations today. Patient is agreeable to CPAP. Placed order for auto CPAP range 5 to 15 cm H2O with heated humidity and mask of choice.   Have download sent 1 month after starting CPAP and set up ROV 2 months after starting CPAP.   Pt verbalized understanding and denied any questions or concerns at this time.  Nothing further needed.

## 2017-09-12 NOTE — Telephone Encounter (Signed)
HST 08/31/17 >> AHI 17.8, SaO2 low 88%   Will have my nurse inform pt that sleep study shows moderate sleep apnea.  Options are 1) CPAP now, 2) ROV first.  If She is agreeable to CPAP, then please send order for auto CPAP range 5 to 15 cm H2O with heated humidity and mask of choice.  Have download sent 1 month after starting CPAP and set up ROV 2 months after starting CPAP.  ROV can be with me or NP.

## 2017-10-12 ENCOUNTER — Ambulatory Visit (INDEPENDENT_AMBULATORY_CARE_PROVIDER_SITE_OTHER): Payer: BLUE CROSS/BLUE SHIELD

## 2017-10-12 DIAGNOSIS — B351 Tinea unguium: Secondary | ICD-10-CM

## 2017-10-12 NOTE — Progress Notes (Signed)
Pt presents with mycotic infection of nails 1-5 bilateral  All other systems are negative  Laser therapy administered to affected nails and tolerated well. All safety precautions were in place. RE-appointed in 4 weeks for 2nd treatment 

## 2017-11-07 ENCOUNTER — Ambulatory Visit: Payer: BLUE CROSS/BLUE SHIELD | Admitting: Pulmonary Disease

## 2017-11-09 ENCOUNTER — Encounter: Payer: Self-pay | Admitting: Pulmonary Disease

## 2017-11-09 ENCOUNTER — Ambulatory Visit: Payer: BLUE CROSS/BLUE SHIELD | Admitting: Pulmonary Disease

## 2017-11-09 ENCOUNTER — Other Ambulatory Visit: Payer: BLUE CROSS/BLUE SHIELD

## 2017-11-09 VITALS — BP 132/78 | HR 85 | Ht 62.0 in | Wt 303.4 lb

## 2017-11-09 DIAGNOSIS — Z9989 Dependence on other enabling machines and devices: Secondary | ICD-10-CM

## 2017-11-09 DIAGNOSIS — G4733 Obstructive sleep apnea (adult) (pediatric): Secondary | ICD-10-CM

## 2017-11-09 DIAGNOSIS — Z6841 Body Mass Index (BMI) 40.0 and over, adult: Secondary | ICD-10-CM | POA: Diagnosis not present

## 2017-11-09 NOTE — Patient Instructions (Signed)
Follow up in 1 year.

## 2017-11-09 NOTE — Progress Notes (Signed)
[facility]  Chief Complaint  Patient presents with  . Follow-up    wearing cpap avg 6-10hr nightly- feels pressure & mask are okay.     Vital signs: BP 132/78 (BP Location: Left Arm, Cuff Size: Large)   Pulse 85   Ht 5\' 2"  (1.575 m)   Wt (!) 303 lb 6.4 oz (137.6 kg)   SpO2 98%   BMI 55.49 kg/m   History of Present Illness: Christine [name] is a 67 y.o. female with obstructive sleep apnea.  She had home sleep study.  Moderate sleep apnea.  Started CPAP.  Sleeping better.  Has nasal pillow mask.  No issue with fit.  Denies sinus congestion, dry mouth, or sore throat.  Sleeping better and more alert during the day.  Trying to adjust her diet to get weight down more.  Comprehensive Respiratory Exam:  Appearance - well kempt  ENMT - nasal mucosa moist, turbinates clear, midline nasal septum, no dental lesions, no gingival bleeding, no oral exudates, no tonsillar hypertrophy, MP 3, enlarged tongue Neck - no masses, trachea midline, no thyromegaly, no elevation in JVP Respiratory - normal appearance of chest wall, normal respiratory effort w/o accessory muscle use, no dullness on percussion, no wheezing or rales CV - s1s2 regular rate and rhythm, no murmurs, no peripheral edema, radial pulses symmetric GI - soft, non tender Lymph - no adenopathy noted in neck and axillary areas MSK - normal muscle strength and tone, normal gait Ext - no cyanosis, clubbing, or joint inflammation noted Skin - venous stasis changes; no lesions, or ulcers Neuro - oriented to person, place, and time Psych - normal mood and affect   Assessment/Plan:  Obstructive sleep apnea - she is compliant with CPAP and reports benefit - continue auto CPAP  Obesity. - reviewed options to assist with weight loss   Patient Instructions  Follow up in 1 year    Chesley Mires, MD [place] 11/09/2017, 11:30 AM  Flow Sheet  Sleep tests: HST  08/31/17 >> AHI 17.8, SaO2 low 88% Auto CPAP 10/10/17 to 11/08/17 >> used on 30 of 30 nights with average 7 hrs 15 min.  Average AHI 1.9 with median CPAP 10 and 95 th percentile CPAP 13 cm H2O  Past Medical History: She  has a past medical history of Anemia, Anxiety, Arthritis, Colon polyps, Coronary artery disease, GERD (gastroesophageal reflux disease), Gout, History of hiatal hernia, Hypertension, Migraine, Obesity, OSA on CPAP, and Pre-diabetes.  Past Surgical History: She  has a past surgical history that includes Tonsillectomy; Abdominal hysterectomy; Cardiac catheterization (~ 2015); laparoscopic appendectomy (N/A, 05/06/2016); Cardiac catheterization (2015); and Chilectomy (Right, 06/01/2017).  Family History: Her family history includes Asthma in her father and mother; Diabetes in her father, mother, and sister; Heart disease in her father and mother; High blood pressure in her father and mother.  Social History: She  reports that she quit smoking about 39 years ago. Her smoking use included cigarettes. She has a 0.60 pack-year smoking history. She has never used smokeless tobacco. She reports that she does not drink alcohol or use drugs.  Medications: Allergies as of 11/09/2017      Reactions   Prednisone Swelling   Legs swell    Tape    Sulfa Antibiotics Rash      Medication List        Accurate as of 11/09/17 11:30 AM. Always use your most recent med list.  acetaminophen 325 MG tablet Commonly known as:  TYLENOL Take 2 tablets (650 mg total) by mouth every 6 (six) hours as needed for mild pain (or temp > 100).   allopurinol 100 MG tablet Commonly known as:  ZYLOPRIM Take 1 tablet (100 mg total) by mouth daily.   aspirin-acetaminophen-caffeine 250-250-65 MG tablet Commonly known as:  EXCEDRIN MIGRAINE Take 2 tablets by mouth every 8 (eight) hours as needed for migraine.   DULoxetine 30 MG capsule Commonly known as:  CYMBALTA Take 30 mg by mouth daily.     fexofenadine 180 MG tablet Commonly known as:  ALLEGRA Take 180 mg by mouth daily.   ibuprofen 200 MG tablet Commonly known as:  ADVIL,MOTRIN Take 400 mg by mouth every 8 (eight) hours as needed for moderate pain.   iron polysaccharides 150 MG capsule Commonly known as:  NIFEREX Take 150 mg by mouth daily.   metoprolol tartrate 50 MG tablet Commonly known as:  LOPRESSOR Take 50 mg by mouth daily.   omeprazole 40 MG capsule Commonly known as:  PRILOSEC Take 1 capsule (40 mg total) by mouth daily.   PERCOGESIC PO Take 1 tablet by mouth daily as needed (pain).   triamterene-hydrochlorothiazide 75-50 MG tablet Commonly known as:  MAXZIDE Take 1 tablet by mouth every evening.

## 2017-12-14 ENCOUNTER — Ambulatory Visit: Payer: BLUE CROSS/BLUE SHIELD | Admitting: Family Medicine

## 2017-12-14 ENCOUNTER — Other Ambulatory Visit: Payer: BLUE CROSS/BLUE SHIELD

## 2018-01-24 ENCOUNTER — Other Ambulatory Visit: Payer: Self-pay

## 2018-02-08 ENCOUNTER — Ambulatory Visit (INDEPENDENT_AMBULATORY_CARE_PROVIDER_SITE_OTHER): Payer: Self-pay

## 2018-02-08 DIAGNOSIS — B351 Tinea unguium: Secondary | ICD-10-CM

## 2018-02-16 NOTE — Progress Notes (Signed)
Pt presents with mycotic infection of nails 1-5 bilateral  All other systems are negative  Laser therapy administered to affected nails and tolerated well. All safety precautions were in place. RE-appointed in 4 weeks for 3rd treatment 

## 2018-02-27 ENCOUNTER — Encounter: Payer: Self-pay | Admitting: Family Medicine

## 2018-02-27 ENCOUNTER — Ambulatory Visit (INDEPENDENT_AMBULATORY_CARE_PROVIDER_SITE_OTHER): Payer: Medicare HMO | Admitting: Family Medicine

## 2018-02-27 ENCOUNTER — Other Ambulatory Visit: Payer: Self-pay | Admitting: Family Medicine

## 2018-02-27 VITALS — BP 120/80 | HR 71 | Temp 97.7°F | Ht 62.0 in | Wt 306.0 lb

## 2018-02-27 DIAGNOSIS — F411 Generalized anxiety disorder: Secondary | ICD-10-CM | POA: Diagnosis not present

## 2018-02-27 DIAGNOSIS — I10 Essential (primary) hypertension: Secondary | ICD-10-CM

## 2018-02-27 DIAGNOSIS — R69 Illness, unspecified: Secondary | ICD-10-CM | POA: Diagnosis not present

## 2018-02-27 DIAGNOSIS — N3946 Mixed incontinence: Secondary | ICD-10-CM

## 2018-02-27 DIAGNOSIS — Z23 Encounter for immunization: Secondary | ICD-10-CM | POA: Diagnosis not present

## 2018-02-27 MED ORDER — OXYBUTYNIN CHLORIDE ER 10 MG PO TB24: 10.0000 mg | ORAL_TABLET | Freq: Every day | ORAL | 1 refills | Status: DC

## 2018-02-27 MED ORDER — MIRABEGRON ER 25 MG PO TB24: 25.0000 mg | ORAL_TABLET | Freq: Every day | ORAL | 2 refills | Status: DC

## 2018-02-27 NOTE — Progress Notes (Signed)
Ditropan called in as Myrbetriq not covered.

## 2018-02-27 NOTE — Progress Notes (Signed)
Chief Complaint  Patient presents with  . Urinary Incontinence    Subjective Christine [name] is a 67 y.o. female who presents for hypertension follow up. She does not routinely monitor home blood pressures. She is compliant with medications Maxzide and Metoprolol. Patient has these side effects of medication: none She is sometimes adhering to a healthy diet overall. Current exercise: nothing  3 mo has had worsening incontinence.  After she had her hysterectomy, she had stress incontinence that was helped with Kegel exercises.  Over the past 3 months, she will have the urge to urinate and 5 seconds later will go regardless of whether she has made it to the bathroom or not.  This is affecting her life significantly as she plans her day around the nearest bathroom.  She is not having any pain or bleeding.  She has a history of anxiety.  She is currently taking Cymbalta 30 mg daily.  Reports compliance.  She is tolerating this well.   Past Medical History:  Diagnosis Date  . Anemia   . Anxiety   . Arthritis    "knees" (04/26/2016)  . Colon polyps    benign per pt  . Coronary artery disease    mild per 2015 cath in [place] (OM1 30%, RCA 30%)  . GERD (gastroesophageal reflux disease)   . Gout   . History of hiatal hernia   . Hypertension   . Migraine    "none since early /2017" (05/06/2016)  . Obesity   . OSA on CPAP    uses CPAP  . Pre-diabetes     Review of Systems GU: +incontinence Const: No fevers  Exam BP 120/80 (BP Location: Left Arm, Patient Position: Sitting, Cuff Size: Large)   Pulse 71   Temp 97.7 F (36.5 C) (Oral)   Ht 5\' 2"  (1.575 m)   Wt (!) 306 lb (138.8 kg)   SpO2 99%   BMI 55.97 kg/m  General:  well developed, well nourished, in no apparent distress HEENT: MMM Heart: RRR, no bruits Lungs: clear to auscultation, no accessory muscle use Abd: Large panniculus, bowel sounds present, no masses, tenderness, no organomegaly Psych: well oriented with normal  range of affect and appropriate judgment/insight  Mixed incontinence urge and stress - Plan: mirabegron ER (MYRBETRIQ) 25 MG TB24 tablet  Hypertension, benign  GAD (generalized anxiety disorder)  Need for influenza vaccination - Plan: Flu Vaccine QUAD 6+ mos PF IM (Fluarix Quad PF)  Start Myrbetriq, let us know if medicine is too expensive.  We will do an extended release anticholinergic.  Counseled her to continue Kegel exercises. Counseled on diet and exercise.  We may be able to take away her metoprolol if she improves her diet. F/u in 1 mo. The patient voiced understanding and agreement to the plan.  [place], DO 02/27/18  11:51 AM

## 2018-02-27 NOTE — Patient Instructions (Signed)
Keep the diet clean and stay active.  Because your blood pressure is well-controlled, you no longer have to check your blood pressure at home anymore unless you wish. Some people check it twice daily every day and some people stop altogether. Either or anything in between is fine. Strong work!  Don't fill medicine if it is too expensive and let me know.  Continue the Kegel exercises.  Mind caffeine and alcohol intake.   Let us know if you need anything.

## 2018-02-27 NOTE — Progress Notes (Signed)
Pre visit review using our clinic review tool, if applicable. No additional management support is needed unless otherwise documented below in the visit note. 

## 2018-03-08 ENCOUNTER — Other Ambulatory Visit: Payer: Self-pay

## 2018-03-15 ENCOUNTER — Other Ambulatory Visit: Payer: Self-pay

## 2018-03-22 ENCOUNTER — Other Ambulatory Visit: Payer: Self-pay

## 2018-03-23 DIAGNOSIS — G4733 Obstructive sleep apnea (adult) (pediatric): Secondary | ICD-10-CM | POA: Diagnosis not present

## 2018-03-29 ENCOUNTER — Ambulatory Visit (INDEPENDENT_AMBULATORY_CARE_PROVIDER_SITE_OTHER): Payer: Medicare HMO | Admitting: Family Medicine

## 2018-03-29 ENCOUNTER — Encounter: Payer: Self-pay | Admitting: Family Medicine

## 2018-03-29 VITALS — BP 122/80 | HR 67 | Temp 98.0°F | Ht 62.0 in | Wt 310.5 lb

## 2018-03-29 DIAGNOSIS — N3946 Mixed incontinence: Secondary | ICD-10-CM | POA: Diagnosis not present

## 2018-03-29 NOTE — Patient Instructions (Signed)
Goal weight by June visit: 290 lbs. Stop drinking soda.  Aim to do some physical exertion for 150 minutes per week. This is typically divided into 5 days per week, 30 minutes per day. The activity should be enough to get your heart rate up. Anything is better than nothing if you have time constraints.  Let us know if you need anything.

## 2018-03-29 NOTE — Progress Notes (Signed)
Chief Complaint  Patient presents with  . Follow-up    Subjective:  Patient is a 67 y.o. female here for urinary incontinence.  Started on Ditropan XL 10 mg/d. Doing 100% better. Having dry mouth, but no other AE's.   Drinking more soda and has gained weight. Starting to cycle and joined silver slippers.   ROS: GU: no incontinence  Past Medical History:  Diagnosis Date  . Anemia   . Anxiety   . Arthritis    "knees" (04/26/2016)  . Colon polyps    benign per pt  . Coronary artery disease    mild per 2015 cath in [place] (OM1 30%, RCA 30%)  . GERD (gastroesophageal reflux disease)   . Gout   . History of hiatal hernia   . Hypertension   . Migraine    "none since early /2017" (05/06/2016)  . Obesity   . OSA on CPAP    uses CPAP  . Pre-diabetes     Objective: BP 122/80 (BP Location: Left Arm, Patient Position: Sitting, Cuff Size: Large)   Pulse 67   Temp 98 F (36.7 C) (Oral)   Ht 5\' 2"  (1.575 m)   Wt (!) 310 lb 8 oz (140.8 kg)   SpO2 99%   BMI 56.79 kg/m  General: Awake, appears stated age HEENT: MMM, EOMi Heart: RRR, no murmurs Lungs: CTAB, no rales, wheezes or rhonchi. No accessory muscle use Abd: BS+, S, NT, ND Psych: Age appropriate judgment and insight, normal affect and mood  Assessment and Plan: Mixed incontinence urge and stress  Morbid obesity (HCC)  Cont Ditropan. Excellent response. Counseled on diet and exercise. Goal weight is 140lbs, by next visit 290 lbs. Will need to cut down on sodas. F/u in 6 mo for CPE.  The patient voiced understanding and agreement to the plan.  [place], DO 03/29/18  11:59 AM

## 2018-03-29 NOTE — Progress Notes (Signed)
Pre visit review using our clinic review tool, if applicable. No additional management support is needed unless otherwise documented below in the visit note. 

## 2018-04-12 ENCOUNTER — Ambulatory Visit: Payer: Self-pay

## 2018-04-12 ENCOUNTER — Encounter: Payer: Self-pay | Admitting: Family Medicine

## 2018-04-12 DIAGNOSIS — B351 Tinea unguium: Secondary | ICD-10-CM

## 2018-04-12 MED ORDER — METOPROLOL TARTRATE 50 MG PO TABS: 50.0000 mg | ORAL_TABLET | Freq: Every day | ORAL | 1 refills | Status: DC

## 2018-04-12 NOTE — Progress Notes (Signed)
Pt presents with mycotic infection of nails 1-5 bilateral  All other systems are negative  Laser therapy administered to affected nails and tolerated well. All safety precautions were in place. RE-appointed in 4 weeks for 4th treatment 

## 2018-04-22 DIAGNOSIS — G4733 Obstructive sleep apnea (adult) (pediatric): Secondary | ICD-10-CM | POA: Diagnosis not present

## 2018-05-10 ENCOUNTER — Ambulatory Visit: Payer: Self-pay

## 2018-05-10 DIAGNOSIS — B351 Tinea unguium: Secondary | ICD-10-CM

## 2018-05-16 NOTE — Progress Notes (Signed)
Pt presents with mycotic infection of nails 1-5 bilateral  All other systems are negative  Laser therapy administered to affected nails and tolerated well. All safety precautions were in place. RE-appointed in 4 weeks for 5th treatment 

## 2018-05-18 ENCOUNTER — Encounter: Payer: Self-pay | Admitting: Family Medicine

## 2018-05-18 MED ORDER — TRIAMTERENE-HCTZ 75-50 MG PO TABS: 1.0000 | ORAL_TABLET | Freq: Every evening | ORAL | 5 refills | Status: DC

## 2018-05-22 ENCOUNTER — Other Ambulatory Visit: Payer: Self-pay | Admitting: Family Medicine

## 2018-05-23 DIAGNOSIS — G4733 Obstructive sleep apnea (adult) (pediatric): Secondary | ICD-10-CM | POA: Diagnosis not present

## 2018-06-11 ENCOUNTER — Other Ambulatory Visit: Payer: Self-pay | Admitting: Family Medicine

## 2018-06-11 DIAGNOSIS — R1013 Epigastric pain: Secondary | ICD-10-CM

## 2018-06-14 ENCOUNTER — Ambulatory Visit (INDEPENDENT_AMBULATORY_CARE_PROVIDER_SITE_OTHER): Payer: Medicare HMO | Admitting: Family Medicine

## 2018-06-14 ENCOUNTER — Encounter: Payer: Self-pay | Admitting: Family Medicine

## 2018-06-14 ENCOUNTER — Other Ambulatory Visit: Payer: Medicare HMO

## 2018-06-14 VITALS — BP 124/80 | HR 86 | Temp 99.6°F | Resp 14 | Wt 306.2 lb

## 2018-06-14 DIAGNOSIS — J3089 Other allergic rhinitis: Secondary | ICD-10-CM | POA: Diagnosis not present

## 2018-06-14 DIAGNOSIS — N3946 Mixed incontinence: Secondary | ICD-10-CM

## 2018-06-14 MED ORDER — METHYLPREDNISOLONE ACETATE 80 MG/ML IJ SUSP
80.0000 mg | Freq: Once | INTRAMUSCULAR | Status: AC
  Administered 2018-06-14: 80 mg via INTRAMUSCULAR

## 2018-06-14 MED ORDER — OXYBUTYNIN CHLORIDE ER 15 MG PO TB24: 15.0000 mg | ORAL_TABLET | Freq: Every day | ORAL | 3 refills | Status: DC

## 2018-06-14 NOTE — Progress Notes (Signed)
CC: [name]  Christine [name] here for [name] complaints.  Duration: 5 days  Associated symptoms: Fever (99.7 F), sinus congestion, rhinorrhea, ear fullness, sore throat, shortness of breath and cough Denies: red eyes, ear pain, ear drainage, wheezing, shortness of breath and myalgia Treatment to date: Ibuprofen Had not cleaned CPAP supplies lately, stopped using CPAP and started in to improve Sick contacts: No  ROS:  Const: +fevers HEENT: As noted in HPI Lungs: No SOB  Past Medical History:  Diagnosis Date  . Anemia   . Anxiety   . Arthritis    "knees" (04/26/2016)  . Colon polyps    benign per pt  . Coronary artery disease    mild per 2015 cath in [place] (OM1 30%, RCA 30%)  . GERD (gastroesophageal reflux disease)   . Gout   . History of hiatal hernia   . Hypertension   . Migraine    "none since early /2017" (05/06/2016)  . Obesity   . OSA on CPAP    uses CPAP  . Pre-diabetes     BP 124/80 (BP Location: Left Arm, Patient Position: Sitting, Cuff Size: Large)   Pulse 86   Temp 99.6 F (37.6 C) (Oral)   Resp 14   Wt (!) 306 lb 3.2 oz (138.9 kg)   SpO2 94%   BMI 56.00 kg/m  General: Awake, alert, appears stated age HEENT: AT, [place], ears patent b/l and TM's neg, nares patent w/o discharge, pharynx pink and without exudates, MMM Neck: No masses or asymmetry Heart: RRR Lungs: CTAB, no accessory muscle use Psych: Age appropriate judgment and insight, normal mood and affect  Non-seasonal allergic rhinitis due to other allergic trigger - Plan: methylPREDNISolone acetate (DEPO-MEDROL) injection 80 mg  Mixed incontinence urge and stress - Plan: oxybutynin (DITROPAN XL) 15 MG 24 hr tablet   Orders as above. Clean CPAP. Continue to push fluids, practice good hand hygiene, cover mouth when coughing. Pt stated she has had depomedrol before without issue. F/u prn. If starting to experience fevers, shaking, or shortness of breath, seek immediate care. Pt voiced understanding and  agreement to the plan.  [place], DO 06/14/18 7:40 AM

## 2018-06-14 NOTE — Patient Instructions (Addendum)
Clean your CPAP!  Continue to push fluids, practice good hand hygiene, and cover your mouth if you cough.  If you start having fevers, shaking or shortness of breath, seek immediate care.  If you aren't turning the corner with the urination issue, come in within 2-3 weeks.  Strong work with the weight loss!  Let us know if you need anything.

## 2018-06-23 DIAGNOSIS — G4733 Obstructive sleep apnea (adult) (pediatric): Secondary | ICD-10-CM | POA: Diagnosis not present

## 2018-07-11 ENCOUNTER — Encounter: Payer: Self-pay | Admitting: Family Medicine

## 2018-07-22 DIAGNOSIS — G4733 Obstructive sleep apnea (adult) (pediatric): Secondary | ICD-10-CM | POA: Diagnosis not present

## 2018-08-08 ENCOUNTER — Encounter: Payer: Self-pay | Admitting: Family Medicine

## 2018-08-08 MED ORDER — DULOXETINE HCL 30 MG PO CPEP: 30.0000 mg | ORAL_CAPSULE | Freq: Every day | ORAL | 0 refills | Status: DC

## 2018-08-15 DIAGNOSIS — R69 Illness, unspecified: Secondary | ICD-10-CM | POA: Diagnosis not present

## 2018-08-22 DIAGNOSIS — G4733 Obstructive sleep apnea (adult) (pediatric): Secondary | ICD-10-CM | POA: Diagnosis not present

## 2018-08-27 ENCOUNTER — Telehealth: Payer: Medicare HMO | Admitting: Family

## 2018-08-27 DIAGNOSIS — Z20822 Contact with and (suspected) exposure to covid-19: Secondary | ICD-10-CM

## 2018-08-27 DIAGNOSIS — R6889 Other general symptoms and signs: Principal | ICD-10-CM

## 2018-08-27 MED ORDER — BENZONATATE 100 MG PO CAPS: 100.0000 mg | ORAL_CAPSULE | Freq: Three times a day (TID) | ORAL | 0 refills | Status: DC | PRN

## 2018-08-27 NOTE — Progress Notes (Signed)
E-Visit for Corona Virus Screening  Based on your current symptoms, you may very well have the virus, however your symptoms are mild. Currently, not all patients are being tested. If the symptoms are mild and there is not a known exposure, performing the test is not indicated.  You have been enrolled in [facility] for COVID-19. Daily you will receive a questionnaire within the [facility] website. Our COVID-19 response team will be monitoring your responses daily.  Approximately 5 minutes was spent documenting and reviewing patient's chart.    Coronavirus disease 2019 (COVID-19) is a respiratory illness that can spread from person to person. The virus that causes COVID-19 is a new virus that was first identified in the country of [place] but is now found in multiple other countries and has spread to the [place].  Symptoms associated with the virus are mild to severe fever, cough, and shortness of breath. There is currently no vaccine to protect against COVID-19, and there is no specific antiviral treatment for the virus.   To be considered HIGH RISK for Coronavirus (COVID-19), you have to meet the following criteria:  . Traveled to [place], [place], [place], [place] or [place]; or in the [place] to [place], [place], [place], or [place]; and have fever, cough, and shortness of breath within the last 2 weeks of travel OR  . Been in close contact with a person diagnosed with COVID-19 within the last 2 weeks and have fever, cough, and shortness of breath  . IF YOU DO NOT MEET THESE CRITERIA, YOU ARE CONSIDERED LOW RISK FOR COVID-19.   It is vitally important that if you feel that you have an infection such as this virus or any other virus that you stay home and away from places where you may spread it to others.  You should self-quarantine for 14 days if you have symptoms that could potentially be coronavirus and avoid contact with people age 66 and older.   You can use  medication such as A prescription cough medication called Tessalon Perles 100 mg. You may take 1-2 capsules every 8 hours as needed for cough  You may also take acetaminophen (Tylenol) as needed for fever.   Reduce your risk of any infection by using the same precautions used for avoiding the common cold or flu:  Marland Kitchen Wash your hands often with soap and warm water for at least 20 seconds.  If soap and water are not readily available, use an alcohol-based hand sanitizer with at least 60% alcohol.  . If coughing or sneezing, cover your mouth and nose by coughing or sneezing into the elbow areas of your shirt or coat, into a tissue or into your sleeve (not your hands). . Avoid shaking hands with others and consider head nods or verbal greetings only. . Avoid touching your eyes, nose, or mouth with unwashed hands.  . Avoid close contact with people who are sick. . Avoid places or events with large numbers of people in one location, like concerts or sporting events. . Carefully consider travel plans you have or are making. . If you are planning any travel outside or inside the [place], visit the CDC's Travelers' Health webpage for the latest health notices. . If you have some symptoms but not all symptoms, continue to monitor at home and seek medical attention if your symptoms worsen. . If you are having a medical emergency, call 911.  HOME CARE . Only take medications as instructed by your medical  team. . Drink plenty of fluids and get plenty of rest. . A steam or ultrasonic humidifier can help if you have congestion.   GET HELP RIGHT AWAY IF: . You develop worsening fever. . You become short of breath . You cough up blood. . Your symptoms become more severe MAKE SURE YOU   Understand these instructions.  Will watch your condition.  Will get help right away if you are not doing well or get worse.  Your e-visit answers were reviewed by a board certified advanced clinical practitioner to  complete your personal care plan.  Depending on the condition, your plan could have included both over the counter or prescription medications.  If there is a problem please reply once you have received a response from your provider. Your safety is important to [place].  If you have drug allergies check your prescription carefully.    You can use MyChart to ask questions about today's visit, request a non-urgent call back, or ask for a work or school excuse for 24 hours related to this e-Visit. If it has been greater than 24 hours you will need to follow up with your provider, or enter a new e-Visit to address those concerns. You will get an e-mail in the next two days asking about your experience.  I hope that your e-visit has been valuable and will speed your recovery. Thank you for using e-visits.

## 2018-08-29 ENCOUNTER — Ambulatory Visit: Payer: Self-pay | Admitting: *Deleted

## 2018-08-29 ENCOUNTER — Other Ambulatory Visit: Payer: Self-pay

## 2018-08-29 ENCOUNTER — Encounter: Payer: Self-pay | Admitting: Family Medicine

## 2018-08-29 ENCOUNTER — Ambulatory Visit (INDEPENDENT_AMBULATORY_CARE_PROVIDER_SITE_OTHER): Payer: Medicare HMO | Admitting: Family Medicine

## 2018-08-29 DIAGNOSIS — Z20828 Contact with and (suspected) exposure to other viral communicable diseases: Secondary | ICD-10-CM

## 2018-08-29 DIAGNOSIS — R6889 Other general symptoms and signs: Secondary | ICD-10-CM

## 2018-08-29 MED ORDER — METHYLPREDNISOLONE 4 MG PO TBPK: ORAL_TABLET | ORAL | 0 refills | Status: DC

## 2018-08-29 NOTE — Telephone Encounter (Signed)
Patient scheduled this morning with PCP.

## 2018-08-29 NOTE — Progress Notes (Signed)
CC: Low grade fever  Christine [name] here for [name] complaints. Due to COVID-19 pandemic, we are interacting via web portal for an electronic face-to-face visit. I verified patient's ID using 2 identifiers. Patient agreed to proceed with visit via this method. Patient is at home, I am at home. Patient and I are present for visit.   Duration: 4 days  Associated symptoms: Fever (99.3 F max), sinus headache, sore throat and myalgia Denies: sinus pain, rhinorrhea, itchy watery eyes, ear pain, ear drainage, wheezing, shortness of breath, chest tightness and GI complaints Treatment to date: Tylenol No recent travel.  Sick contacts: No  ROS:  Const: + fevers HEENT: As noted in HPI Lungs: No SOB  Past Medical History:  Diagnosis Date  . Anemia   . Anxiety   . Arthritis    "knees" (04/26/2016)  . Colon polyps    benign per pt  . Coronary artery disease    mild per 2015 cath in [place] (OM1 30%, RCA 30%)  . GERD (gastroesophageal reflux disease)   . Gout   . History of hiatal hernia   . Hypertension   . Migraine    "none since early /2017" (05/06/2016)  . Obesity   . OSA on CPAP    uses CPAP  . Pre-diabetes    Exam No conversational dyspnea Age appropriate judgment and insight Nml affect and mood  Flu-like symptoms - Plan: methylPREDNISolone (MEDROL DOSEPAK) 4 MG TBPK tablet  Orders as above. Continue to push fluids, practice good hand hygiene, cover mouth when coughing. Could be COVID-19, discussed warning s/s's for which to seek emergent care. F/u prn. If starting to experience fevers, shaking, or shortness of breath, seek immediate care. Pt voiced understanding and agreement to the plan.  [place], DO 08/29/18 8:55 AM

## 2018-08-29 NOTE — Telephone Encounter (Signed)
Pt called with symptoms of fever, body aches, headache and no appetite. Her temp was 99.3 last night and she felt feverish. No fever this morning.. No shortness of breath or cough. Laid around all day yesterday. She lives alone but has gone out to the grocery store and pharmacy, covering her face with a scarf.  She is requesting an appointment. Flow notified at [facility] at [facility]. Appointment scheduled for 8:45 this morning. Pt voiced understanding and will call back for any concerns. Routing to flow at [facility] at [facility].  Reason for Disposition . [1] COVID-19 infection diagnosed or suspected AND [2] mild symptoms (fever, cough) AND [3] no trouble breathing or other complications  Answer Assessment - Initial Assessment Questions 1. COVID-19 DIAGNOSIS: "Who made your Coronavirus (COVID-19) diagnosis?" "Was it confirmed by a positive lab test?" If not diagnosed by a HCP, ask "Are there lots of cases (community spread) where you live?" (See public health department website, if unsure)   * MAJOR community spread: high number of cases; numbers of cases are increasing; many people hospitalized.   * MINOR community spread: low number of cases; not increasing; few or no people hospitalized     In the community 2. ONSET: "When did the COVID-19 symptoms start?"       A couple days ago 3. WORST SYMPTOM: "What is your worst symptom?" (e.g., cough, fever, shortness of breath, muscle aches)     dizziness 4. COUGH: "Do you have a cough?" If so, ask: "How bad is the cough?"       nok 5. FEVER: "Do you have a fever?" If so, ask: "What is your temperature, how was it measured, and when did it start?"     Yes 99.3 last night 6. RESPIRATORY STATUS: "Describe your breathing?" (e.g., shortness of breath, wheezing, unable to speak)      no 7. BETTER-SAME-WORSE: "Are you getting better, staying the same or getting worse compared to yesterday?"  If getting worse, ask, "In what way?"     About  the same as yesterday 8. HIGH RISK DISEASE: "Do you have any chronic medical problems?" (e.g., asthma, heart or lung disease, weak immune system, etc.)     HTN 9. PREGNANCY: "Is there any chance you are pregnant?" "When was your last menstrual period?"     n/a 10. OTHER SYMPTOMS: "Do you have any other symptoms?"  (e.g., runny nose, headache, sore throat, loss of smell)       Headache  Protocols used: CORONAVIRUS (COVID-19) DIAGNOSED OR SUSPECTED-A-AH

## 2018-08-30 ENCOUNTER — Encounter (INDEPENDENT_AMBULATORY_CARE_PROVIDER_SITE_OTHER): Payer: Self-pay

## 2018-08-30 ENCOUNTER — Other Ambulatory Visit: Payer: Medicare HMO

## 2018-08-31 ENCOUNTER — Encounter (INDEPENDENT_AMBULATORY_CARE_PROVIDER_SITE_OTHER): Payer: Self-pay

## 2018-09-01 ENCOUNTER — Encounter (INDEPENDENT_AMBULATORY_CARE_PROVIDER_SITE_OTHER): Payer: Self-pay

## 2018-09-01 ENCOUNTER — Telehealth: Payer: Self-pay | Admitting: *Deleted

## 2018-09-01 NOTE — Telephone Encounter (Signed)
Noted. Have her check in on Monday with [place] if not improving. Ty.

## 2018-09-01 NOTE — Telephone Encounter (Signed)
Patient called after BPA received via [place] for new cough, worsening weakness and new diarrhea. Pt states she feels completely off today and started to notice it yesterday. Pt was seen for a virtual visit on Tuesday with Dr. Nani Ravens and was prescribed Methylprednisolone. Pt states she felt great for the first couple of days but now feels like she did before the virtual visit. Pt states she will be finished with the prescription on Sunday. Pt states that she has  a little more coughing today and it is a little worse. Pt states that the cough is dry. Pt states that that she has not quite experienced diarrhea but had 2 extremely soft stools today and had some stomach pain last night.Pt states she feels a little dizzy and tired. Pt states she is dizzy with sitting and  Standing but does not feel like she is going to pass out and does not have to hold on to anything to keep her balance. Pt states that with walking "I feel like everything is distorted, when I was walking through the hallway it felt like I was in a fun house, like the hall was getting longer". Pt states she is able to drink fluids and able to eat but is not eating a lot. Patient checked BP while on the phone with triage nurse and was noted to be 149/69 and HR 73.Pt encouraged to change positions slowly when going from a lying to sitting and from a sitting to lying position to decrease worsening dizziness. Pt states she does not have a headache but feels pressure at the top of her head. Advised pt to call 911 if she feels like she is going to pass out and/or if weakness and dizziness worsens.Pt advised that she could try over the counter medication for cough and diarrhea if symptoms persist. Advised that due to history of HTN she may want to try Coricidin for cough. Pt advised to stay hydrated and try to eat bland foods as tolerated to prevent diarrhea from becoming worse. Pt advised that PCP would be notified of current symptoms for  recommendations. Pt verbalized understanding. Pt can be contacted at [phone].

## 2018-09-01 NOTE — Telephone Encounter (Signed)
Patient informed of PCP instructions. She did verbalize understanding.

## 2018-09-02 ENCOUNTER — Encounter (INDEPENDENT_AMBULATORY_CARE_PROVIDER_SITE_OTHER): Payer: Self-pay

## 2018-09-03 ENCOUNTER — Encounter (INDEPENDENT_AMBULATORY_CARE_PROVIDER_SITE_OTHER): Payer: Self-pay

## 2018-09-04 ENCOUNTER — Encounter (INDEPENDENT_AMBULATORY_CARE_PROVIDER_SITE_OTHER): Payer: Self-pay

## 2018-09-05 ENCOUNTER — Encounter (INDEPENDENT_AMBULATORY_CARE_PROVIDER_SITE_OTHER): Payer: Self-pay

## 2018-09-06 ENCOUNTER — Encounter (INDEPENDENT_AMBULATORY_CARE_PROVIDER_SITE_OTHER): Payer: Self-pay

## 2018-09-07 ENCOUNTER — Encounter: Payer: Self-pay | Admitting: Family Medicine

## 2018-09-07 ENCOUNTER — Telehealth: Payer: Self-pay | Admitting: *Deleted

## 2018-09-07 ENCOUNTER — Encounter (INDEPENDENT_AMBULATORY_CARE_PROVIDER_SITE_OTHER): Payer: Self-pay

## 2018-09-07 NOTE — Telephone Encounter (Signed)
Contacting pt due to my chart response of new cough, along with worsening weakness and appetite; she says that her cough from 09/01/2018 has resolved; this morning her cough woke her up around 0500 but she has not had any incidences; the pt also says that she has no energy, and she is force feeding herself; pt encouraged to try having smaller meals throughout the day, resting and prioritizing activities, and /cough drops for cough; the pt verbalized understanding; will route to provider for notification of this encounter.

## 2018-09-08 ENCOUNTER — Encounter (INDEPENDENT_AMBULATORY_CARE_PROVIDER_SITE_OTHER): Payer: Self-pay

## 2018-09-09 ENCOUNTER — Encounter (INDEPENDENT_AMBULATORY_CARE_PROVIDER_SITE_OTHER): Payer: Self-pay

## 2018-09-12 DIAGNOSIS — Z20828 Contact with and (suspected) exposure to other viral communicable diseases: Secondary | ICD-10-CM | POA: Diagnosis not present

## 2018-09-13 ENCOUNTER — Other Ambulatory Visit: Payer: Medicare HMO

## 2018-09-14 DIAGNOSIS — R69 Illness, unspecified: Secondary | ICD-10-CM | POA: Diagnosis not present

## 2018-09-21 DIAGNOSIS — G4733 Obstructive sleep apnea (adult) (pediatric): Secondary | ICD-10-CM | POA: Diagnosis not present

## 2018-09-25 ENCOUNTER — Other Ambulatory Visit: Payer: Medicare HMO

## 2018-09-25 ENCOUNTER — Ambulatory Visit: Payer: Medicare HMO | Admitting: Podiatry

## 2018-09-25 ENCOUNTER — Other Ambulatory Visit: Payer: Self-pay | Admitting: Podiatry

## 2018-09-25 ENCOUNTER — Ambulatory Visit (INDEPENDENT_AMBULATORY_CARE_PROVIDER_SITE_OTHER): Payer: Medicare HMO

## 2018-09-25 ENCOUNTER — Other Ambulatory Visit: Payer: Self-pay

## 2018-09-25 ENCOUNTER — Encounter: Payer: Self-pay | Admitting: Podiatry

## 2018-09-25 DIAGNOSIS — M79672 Pain in left foot: Secondary | ICD-10-CM | POA: Diagnosis not present

## 2018-09-25 DIAGNOSIS — M778 Other enthesopathies, not elsewhere classified: Secondary | ICD-10-CM

## 2018-09-25 DIAGNOSIS — M659 Synovitis and tenosynovitis, unspecified: Secondary | ICD-10-CM

## 2018-09-25 DIAGNOSIS — M65972 Unspecified synovitis and tenosynovitis, left ankle and foot: Secondary | ICD-10-CM

## 2018-09-25 DIAGNOSIS — M7752 Other enthesopathy of left foot: Secondary | ICD-10-CM | POA: Diagnosis not present

## 2018-09-25 DIAGNOSIS — M25572 Pain in left ankle and joints of left foot: Secondary | ICD-10-CM

## 2018-09-25 DIAGNOSIS — M65172 Other infective (teno)synovitis, left ankle and foot: Secondary | ICD-10-CM | POA: Diagnosis not present

## 2018-09-27 ENCOUNTER — Encounter: Payer: Self-pay | Admitting: Family Medicine

## 2018-09-27 ENCOUNTER — Other Ambulatory Visit: Payer: Self-pay

## 2018-09-27 ENCOUNTER — Ambulatory Visit (INDEPENDENT_AMBULATORY_CARE_PROVIDER_SITE_OTHER): Payer: Medicare HMO | Admitting: Family Medicine

## 2018-09-27 VITALS — BP 120/72 | HR 73 | Temp 98.2°F | Ht 62.5 in | Wt 311.0 lb

## 2018-09-27 DIAGNOSIS — Z114 Encounter for screening for human immunodeficiency virus [HIV]: Secondary | ICD-10-CM

## 2018-09-27 DIAGNOSIS — Z23 Encounter for immunization: Secondary | ICD-10-CM

## 2018-09-27 DIAGNOSIS — Z7189 Other specified counseling: Secondary | ICD-10-CM | POA: Diagnosis not present

## 2018-09-27 DIAGNOSIS — E2839 Other primary ovarian failure: Secondary | ICD-10-CM

## 2018-09-27 DIAGNOSIS — R1013 Epigastric pain: Secondary | ICD-10-CM

## 2018-09-27 DIAGNOSIS — Z0000 Encounter for general adult medical examination without abnormal findings: Secondary | ICD-10-CM

## 2018-09-27 DIAGNOSIS — Z1239 Encounter for other screening for malignant neoplasm of breast: Secondary | ICD-10-CM

## 2018-09-27 DIAGNOSIS — Z1159 Encounter for screening for other viral diseases: Secondary | ICD-10-CM | POA: Diagnosis not present

## 2018-09-27 DIAGNOSIS — R69 Illness, unspecified: Secondary | ICD-10-CM | POA: Diagnosis not present

## 2018-09-27 LAB — COMPREHENSIVE METABOLIC PANEL
ALT: 11 U/L (ref 0–35)
AST: 12 U/L (ref 0–37)
Albumin: 3.7 g/dL (ref 3.5–5.2)
Alkaline Phosphatase: 72 U/L (ref 39–117)
BUN: 31 mg/dL — ABNORMAL HIGH (ref 6–23)
CO2: 27 mEq/L (ref 19–32)
Calcium: 8.8 mg/dL (ref 8.4–10.5)
Chloride: 105 mEq/L (ref 96–112)
Creatinine, Ser: 0.98 mg/dL (ref 0.40–1.20)
GFR: 68.8 mL/min (ref >60.00)
Glucose, Bld: 85 mg/dL (ref 70–99)
Potassium: 4.3 mEq/L (ref 3.5–5.1)
Sodium: 139 mEq/L (ref 135–145)
Total Bilirubin: 0.3 mg/dL (ref 0.2–1.2)
Total Protein: 7 g/dL (ref 6.0–8.3)

## 2018-09-27 LAB — LIPID PANEL
Cholesterol: 215 mg/dL — ABNORMAL HIGH (ref 0–200)
HDL: 86.5 mg/dL (ref >39.00)
LDL Cholesterol: 115 mg/dL — ABNORMAL HIGH (ref 0–99)
NonHDL: 128.58
Total CHOL/HDL Ratio: 2
Triglycerides: 66 mg/dL (ref 0.0–149.0)
VLDL: 13.2 mg/dL (ref 0.0–40.0)

## 2018-09-27 MED ORDER — OMEPRAZOLE 40 MG PO CPDR: DELAYED_RELEASE_CAPSULE | ORAL | 2 refills | Status: DC

## 2018-09-27 NOTE — Patient Instructions (Addendum)
Call Center for [place] at [facility] at [phone] for an appointment.  They are located at [address], [place], [place], [address] (right across the hall from our office).  Give [place] 4-5 business days to get the results of your labs back.   Keep the diet clean and stay active.  Let us know if you need anything.

## 2018-09-27 NOTE — Progress Notes (Signed)
   Subjective:  68 year old female presenting today with a chief complaint of left dorsolateral foot and ankle pain that began about 4 weeks ago. She states the pain is constant and describes it as shooting, stabbing pain. She states the pain began while she was walking and accidentally bent it abnormally. She has not done anything at home for treatment as the pain had resolved but has now returned. Walking and weightbearing increases the pain. Patient is here for further evaluation and treatment.   Past Medical History:  Diagnosis Date  . Anemia   . Anxiety   . Arthritis    "knees" (04/26/2016)  . Colon polyps    benign per pt  . Coronary artery disease    mild per 2015 cath in [place] (OM1 30%, RCA 30%)  . GERD (gastroesophageal reflux disease)   . Gout   . History of hiatal hernia   . Hypertension   . Migraine    "none since early /2017" (05/06/2016)  . Obesity   . OSA on CPAP    uses CPAP  . Pre-diabetes      Objective / Physical Exam:  General:  The patient is alert and oriented x3 in no acute distress. Dermatology:  Skin is warm, dry and supple bilateral lower extremities. Negative for open lesions or macerations. Vascular:  Palpable pedal pulses bilaterally. No edema or erythema noted. Capillary refill within normal limits. Neurological:  Epicritic and protective threshold grossly intact bilaterally.  Musculoskeletal Exam:  Pain on palpation to the anterior lateral medial aspects of the patient's left ankle. Mild edema noted. Range of motion within normal limits to all pedal and ankle joints bilateral. Muscle strength 5/5 in all groups bilateral.   Radiographic Exam:  Normal osseous mineralization. Joint spaces preserved. No fracture/dislocation/boney destruction.    Assessment: 1. Left ankle synovitis   Plan of Care:  1. Patient was evaluated. X-Rays reviewed.  2. injection of 0.5 mL Celestone Soluspan injected in the patient's left ankle. 3. Resume taking  Meloxicam daily.  4. Recommended good shoe gear.  5. Return to clinic in 4 weeks.    Edrick Kins, DPM [facility]  Dr. Edrick Kins, [address]                                        [place], [place] [address]                Office [phone]  Fax [phone]

## 2018-09-27 NOTE — Progress Notes (Signed)
Chief Complaint  Patient presents with  . Annual Exam     Well Woman Christine [name] is here for a complete physical.   Her last physical was >1 year ago.  Current diet: in general, a "healthy" diet. Current exercise: cycling. Weight is increased a little and she denies daytime fatigue out of the ordinary. No LMP recorded. Patient has had a hysterectomy..  Seatbelt? Yes  Health Maintenance Pap/HPV- No Mammogram- No Tetanus- Yes Hep C screening- No HIV screening- No  Past Medical History:  Diagnosis Date  . Anemia   . Anxiety   . Arthritis    "knees" (04/26/2016)  . Colon polyps    benign per pt  . Coronary artery disease    mild per 2015 cath in [place] (OM1 30%, RCA 30%)  . GERD (gastroesophageal reflux disease)   . Gout   . History of hiatal hernia   . Hypertension   . Migraine    "none since early /2017" (05/06/2016)  . Obesity   . OSA on CPAP    uses CPAP  . Pre-diabetes      Past Surgical History:  Procedure Laterality Date  . ABDOMINAL HYSTERECTOMY     "partial"  . CARDIAC CATHETERIZATION  ~ 2015  . CARDIAC CATHETERIZATION  2015   In [place]  . CHILECTOMY Right 06/01/2017   Procedure: CHILECTOMY RIGHT FOOT;  Surgeon: Edrick Kins, DPM;  Location: [facility];  Service: Podiatry;  Laterality: Right;  . LAPAROSCOPIC APPENDECTOMY N/A 05/06/2016   Procedure: LAPAROSCOPIC APPENDECTOMY;  Surgeon: Donnie Mesa, MD;  Location: [facility] OR;  Service: General;  Laterality: N/A;  . TONSILLECTOMY      Medications  Current Outpatient Medications on File Prior to Visit  Medication Sig Dispense Refill  . acetaminophen (TYLENOL) 325 MG tablet Take 2 tablets (650 mg total) by mouth every 6 (six) hours as needed for mild pain (or temp > 100).    Marland Kitchen aspirin-acetaminophen-caffeine (EXCEDRIN MIGRAINE) 250-250-65 MG tablet Take 2 tablets by mouth every 8 (eight) hours as needed for migraine.    . Diphenhydramine-Acetaminophen (PERCOGESIC PO) Take 1 tablet by mouth daily as needed  (pain).     . DULoxetine (CYMBALTA) 30 MG capsule Take 1 capsule (30 mg total) by mouth daily. 90 capsule 0  . fexofenadine (ALLEGRA) 180 MG tablet Take 180 mg by mouth daily.    Marland Kitchen ibuprofen (ADVIL,MOTRIN) 200 MG tablet Take 400 mg by mouth every 8 (eight) hours as needed for moderate pain.    . iron polysaccharides (NIFEREX) 150 MG capsule Take 150 mg by mouth daily.    . metoprolol tartrate (LOPRESSOR) 50 MG tablet Take 1 tablet (50 mg total) by mouth daily. 90 tablet 1  . oxybutynin (DITROPAN XL) 15 MG 24 hr tablet Take 1 tablet (15 mg total) by mouth at bedtime. 30 tablet 3  . triamterene-hydrochlorothiazide (MAXZIDE) 75-50 MG tablet Take 1 tablet by mouth every evening. 30 tablet 5    Allergies Allergies  Allergen Reactions  . Prednisone Swelling    Legs swell   . Tape   . Sulfa Antibiotics Rash    Review of Systems: Constitutional:  no unexpected weight changes Eye:  no recent significant change in vision Ear/Nose/Mouth/Throat:  Ears:  no tinnitus or vertigo and no recent change in hearing Nose/Mouth/Throat:  no complaints of nasal congestion, no sore throat Cardiovascular: no chest pain Respiratory:  no cough and no shortness of breath Gastrointestinal:  no abdominal pain (w PPI), no change in bowel habits  GU:  Female: negative for dysuria or pelvic pain Musculoskeletal/Extremities:  no pain of the joints Integumentary (Skin/Breast):  no abnormal skin lesions reported Neurologic:  no headaches Endocrine:  denies fatigue Hematologic/Lymphatic:  No areas of easy bleeding  Exam BP 120/72 (BP Location: Left Arm, Patient Position: Sitting, Cuff Size: Large)   Pulse 73   Temp 98.2 F (36.8 C) (Oral)   Ht 5' 2.5" (1.588 m)   Wt (!) 311 lb (141.1 kg)   SpO2 96%   BMI 55.98 kg/m  General:  well developed, well nourished, in no apparent distress Skin:  no significant moles, warts, or growths Head:  no masses, lesions, or tenderness Eyes:  pupils equal and round, sclera  anicteric without injection Ears:  canals without lesions, TMs shiny without retraction, no obvious effusion, no erythema Nose:  nares patent, septum midline, mucosa normal, and no drainage or sinus tenderness Throat/Pharynx:  lips and gingiva without lesion; tongue and uvula midline; non-inflamed pharynx; no exudates or postnasal drainage Neck: neck supple without adenopathy, thyromegaly, or masses Lungs:  clear to auscultation, breath sounds equal bilaterally, no respiratory distress Cardio:  regular rate and rhythm, no bruits, no LE edema Abdomen:  abdomen soft, nontender; bowel sounds normal; no masses or organomegaly Genital: Defer to GYN Musculoskeletal:  symmetrical muscle groups noted without atrophy or deformity Extremities:  no clubbing, cyanosis, or edema, no deformities, no skin discoloration Neuro:  gait normal; deep tendon reflexes normal and symmetric Psych: well oriented with normal range of affect and appropriate judgment/insight  Assessment and Plan  Well adult exam - Plan: Comprehensive metabolic panel, Lipid panel  Screening for malignant neoplasm of breast - Plan: MM DIGITAL SCREENING BILATERAL  Estrogen deficiency - Plan: DG Bone Density  Encounter for hepatitis C screening test for low risk patient - Plan: Hepatitis C antibody  Screening for HIV (human immunodeficiency virus) - Plan: HIV Antibody (routine testing w rflx)  Educated About Covid-19 Virus Infection - Plan: SAR CoV2 Serology (COVID 19)AB(IGG)IA  Epigastric abdominal pain - Plan: omeprazole (PRILOSEC) 40 MG capsule  Need for vaccination against Streptococcus pneumoniae - Plan: Pneumococcal polysaccharide vaccine 23-valent greater than or equal to 2yo subcutaneous/IM  Morbid obesity ([place])   Well 68 y.o. female. Counseled on diet and exercise. Other orders as above. PCV23. No longer rec'd to receive Prevnar. GYN info provided. Encouraged to call.  Follow up in 6 mo or prn. The patient voiced  understanding and agreement to the plan.  [place], DO 09/27/18 10:52 AM

## 2018-09-28 LAB — HIV ANTIBODY (ROUTINE TESTING W REFLEX): HIV 1&2 Ab, 4th Generation: NONREACTIVE

## 2018-09-28 LAB — SAR COV2 SEROLOGY (COVID19)AB(IGG),IA: SARS CoV2 AB IGG: NEGATIVE

## 2018-09-28 LAB — HEPATITIS C ANTIBODY
Hepatitis C Ab: NONREACTIVE
SIGNAL TO CUT-OFF: 0.02 (ref <1.00)

## 2018-10-06 ENCOUNTER — Ambulatory Visit (HOSPITAL_BASED_OUTPATIENT_CLINIC_OR_DEPARTMENT_OTHER)
Admission: RE | Admit: 2018-10-06 | Discharge: 2018-10-06 | Disposition: A | Discharge status: Home / Self Care | Payer: Medicare HMO | Source: Ambulatory Visit | Attending: Family Medicine | Admitting: Family Medicine

## 2018-10-06 ENCOUNTER — Other Ambulatory Visit: Payer: Self-pay

## 2018-10-06 DIAGNOSIS — E2839 Other primary ovarian failure: Secondary | ICD-10-CM | POA: Diagnosis not present

## 2018-10-06 DIAGNOSIS — Z1239 Encounter for other screening for malignant neoplasm of breast: Secondary | ICD-10-CM

## 2018-10-06 DIAGNOSIS — Z780 Asymptomatic menopausal state: Secondary | ICD-10-CM | POA: Diagnosis not present

## 2018-10-06 DIAGNOSIS — Z1231 Encounter for screening mammogram for malignant neoplasm of breast: Secondary | ICD-10-CM | POA: Insufficient documentation

## 2018-10-23 ENCOUNTER — Encounter: Payer: Self-pay | Admitting: Podiatry

## 2018-10-23 ENCOUNTER — Ambulatory Visit (INDEPENDENT_AMBULATORY_CARE_PROVIDER_SITE_OTHER): Payer: Medicare HMO | Admitting: Podiatry

## 2018-10-23 ENCOUNTER — Other Ambulatory Visit: Payer: Self-pay

## 2018-10-23 VITALS — Temp 98.0°F

## 2018-10-23 DIAGNOSIS — B351 Tinea unguium: Secondary | ICD-10-CM | POA: Diagnosis not present

## 2018-10-23 DIAGNOSIS — M7672 Peroneal tendinitis, left leg: Secondary | ICD-10-CM | POA: Diagnosis not present

## 2018-10-23 DIAGNOSIS — M659 Synovitis and tenosynovitis, unspecified: Secondary | ICD-10-CM

## 2018-10-23 MED ORDER — MELOXICAM 15 MG PO TABS: 15.0000 mg | ORAL_TABLET | Freq: Every day | ORAL | 1 refills | Status: DC

## 2018-10-24 ENCOUNTER — Encounter: Payer: Self-pay | Admitting: Family Medicine

## 2018-10-24 ENCOUNTER — Ambulatory Visit (INDEPENDENT_AMBULATORY_CARE_PROVIDER_SITE_OTHER): Payer: Medicare HMO | Admitting: Family Medicine

## 2018-10-24 DIAGNOSIS — M940 Chondrocostal junction syndrome [Tietze]: Secondary | ICD-10-CM | POA: Diagnosis not present

## 2018-10-24 NOTE — Progress Notes (Signed)
Musculoskeletal Exam  Patient: Christine [name] DOB: 08-06-1950  DOS: 10/24/2018  SUBJECTIVE:  Chief Complaint:  Chest pain  Christine [name] is a 68 y.o.  female for evaluation and treatment of central chest pain. Due to COVID-19 pandemic, we are interacting via web portal for an electronic face-to-face visit. I verified patient's ID using 2 identifiers. Patient agreed to proceed with visit via this method. Patient is at home, I am at office. Patient and I are present for visit.   Onset:  1 day ago.  The patient was leaning on the counter and adjusted her position and felt a pop in her chest. Location: Lower sternum Character:  aching and sharp  Progression of issue:  has improved Associated symptoms: pain with deep breathing Treatment: to date has been prescription NSAIDS.   Neurovascular symptoms: no  ROS: Musculoskeletal/Extremities: +chest pain  Past Medical History:  Diagnosis Date  . Anemia   . Anxiety   . Arthritis    "knees" (04/26/2016)  . Colon polyps    benign per pt  . Coronary artery disease    mild per 2015 cath in [place] (OM1 30%, RCA 30%)  . GERD (gastroesophageal reflux disease)   . Gout   . History of hiatal hernia   . Hypertension   . Migraine    "none since early /2017" (05/06/2016)  . Obesity   . OSA on CPAP    uses CPAP  . Pre-diabetes     Objective: No conversational dyspnea Age appropriate judgment and insight Nml affect and mood  Assessment:  Costochondritis - Plan: Cont Mobic, can use Tylenol prn. Ice/heat. Stretch pecs. If things worsen, could let me know and trial muscle relaxant.    Plan: Orders as above. F/u prn. The patient voiced understanding and agreement to the plan.   [place], DO 10/24/18  4:33 PM

## 2018-10-25 ENCOUNTER — Other Ambulatory Visit: Payer: Medicare HMO

## 2018-10-25 ENCOUNTER — Encounter: Payer: Self-pay | Admitting: Family Medicine

## 2018-10-25 DIAGNOSIS — N3946 Mixed incontinence: Secondary | ICD-10-CM

## 2018-10-25 MED ORDER — OXYBUTYNIN CHLORIDE ER 15 MG PO TB24: 15.0000 mg | ORAL_TABLET | Freq: Every day | ORAL | 5 refills | Status: DC

## 2018-10-25 NOTE — Progress Notes (Signed)
   Subjective: 68 y.o. female presenting today for follow up evaluation of left ankle pain. She states she is doing well and has not had any significant pain since her last visit. She does note mild discomfort intermittently to the lateral left ankle. She has been taking Meloxicam for treatment but is now out. Walking and bearing weight for long periods of time increases the pain. Patient is here for further evaluation and treatment.   Past Medical History:  Diagnosis Date  . Anemia   . Anxiety   . Arthritis    "knees" (04/26/2016)  . Colon polyps    benign per pt  . Coronary artery disease    mild per 2015 cath in [place] (OM1 30%, RCA 30%)  . GERD (gastroesophageal reflux disease)   . Gout   . History of hiatal hernia   . Hypertension   . Migraine    "none since early /2017" (05/06/2016)  . Obesity   . OSA on CPAP    uses CPAP  . Pre-diabetes     Objective: Physical Exam General: The patient is alert and oriented x3 in no acute distress.  Dermatology: Hyperkeratotic, discolored, thickened, onychodystrophy of nails noted 1-5 bilaterally. Skin is warm, dry and supple bilateral lower extremities. Negative for open lesions or macerations.  Vascular: Palpable pedal pulses bilaterally. No edema or erythema noted. Capillary refill within normal limits.  Neurological: Epicritic and protective threshold grossly intact bilaterally.   Musculoskeletal Exam: Pain with palpation to the insertion of the peroneal tendon of the left foot. Range of motion within normal limits to all pedal and ankle joints bilateral. Muscle strength 5/5 in all groups bilateral.   Assessment: #1 Onychomycosis nails 1-5 bilaterally #2 Hyperkeratotic nails 1-5 bilaterally #3 Insertional peroneal tendinitis left   Plan of Care:  #1 Patient was evaluated. #2 Injection of 0.5 mLs Celestone Soluspan injected into the insertion of the peroneal tendon of the left foot.  #3 Refill prescription for Meloxicam 15 mg  provided to patient.  #4 Continue laser treatment with Janett Billow, RN here in the office.  #5 Return to clinic as needed.    Edrick Kins, DPM [facility]  Dr. Edrick Kins, [address]                                        [place], [place] [address]                Office [phone]  Fax [phone]

## 2018-10-26 ENCOUNTER — Ambulatory Visit: Payer: Medicare HMO | Admitting: Pulmonary Disease

## 2018-10-26 ENCOUNTER — Other Ambulatory Visit: Payer: Self-pay

## 2018-10-26 ENCOUNTER — Encounter: Payer: Self-pay | Admitting: Pulmonary Disease

## 2018-10-26 VITALS — BP 130/64 | HR 69 | Temp 97.7°F | Ht 61.5 in | Wt 308.6 lb

## 2018-10-26 DIAGNOSIS — Z9989 Dependence on other enabling machines and devices: Secondary | ICD-10-CM | POA: Diagnosis not present

## 2018-10-26 DIAGNOSIS — Z6841 Body Mass Index (BMI) 40.0 and over, adult: Secondary | ICD-10-CM | POA: Diagnosis not present

## 2018-10-26 DIAGNOSIS — G4733 Obstructive sleep apnea (adult) (pediatric): Secondary | ICD-10-CM

## 2018-10-26 NOTE — Patient Instructions (Signed)
Follow up in 1 year.

## 2018-10-26 NOTE — Progress Notes (Signed)
[facility]  Chief Complaint  Patient presents with  . Follow-up    OSA follow up, DME [employer], denies problems    Constitutional:  BP 130/64 (BP Location: Left Arm, Cuff Size: Normal)   Pulse 69   Temp 97.7 F (36.5 C) (Oral)   Ht 5' 1.5" (1.562 m)   Wt (!) 308 lb 9.6 oz (140 kg)   SpO2 98%   BMI 57.37 kg/m   Past Medical History:  Migraine HA, HTN, HH, GERD, Gout, CAD, Colon polyps, OA, Anxiety, Anemia  Brief Summary:  Christine [name] is a 68 y.o. female with obstructive sleep apnea.  She is doing well with CPAP.  Has nasal pillows mask.  No trouble with sinus congestion, sore throat, dry mouth, or aerophagia.  She had the flu several months ago, and had a two week stretch in which she couldn't use CPAP.  Otherwise uses every night.  She has changed her diet and uses a stationary bike with the plan to lose weight.  Physical Exam:   Appearance - well kempt   ENMT - clear nasal mucosa, midline nasal  septum, no oral exudates, no LAN, trachea midline  Respiratory - normal chest wall, normal respiratory effort, no accessory muscle use, no wheeze/rales  CV - s1s2 regular rate and rhythm, no murmurs, no peripheral edema, radial pulses symmetric  GI - soft, non tender, no masses  Lymph - no adenopathy noted in neck and axillary areas  MSK - normal gait  Ext - no cyanosis, clubbing, or joint inflammation noted  Skin - no rashes, lesions, or ulcers  Neuro - normal strength, oriented x 3  Psych - normal mood and affect   Assessment/Plan:   Obstructive sleep apnea. - she is compliant with CPAP and reports benefit from therapy - continue auto CPAP  Obesity. - encouraged her to keep up with her weight loss efforts - discussed how her weight impacts her sleep apnea     Patient Instructions  Follow up in 1 year    Chesley Mires, MD [place] Pager: [phone] 10/26/2018, 9:25 AM  Flow Sheet     Sleep tests:  HST 08/31/17 >> AHI 17.8, SaO2 low 88% Auto CPAP 09/24/18 to 10/23/18 >> used on 30 of 30 nights with average 6 hrs 49 min.  Average AHI 2.1 with median CPAP 10 and 95 th percentile CPAP 13 cm H2O.  Medications:   Allergies as of 10/26/2018      Reactions   Prednisone Swelling   Legs swell    Tape    Sulfa Antibiotics Rash      Medication List       Accurate as of October 26, 2018  9:25 AM. If you have any questions, ask your nurse or doctor.        acetaminophen 325 MG tablet Commonly known as: TYLENOL Take 2 tablets (650 mg total) by mouth every 6 (six) hours as needed for mild pain (or temp > 100).   aspirin-acetaminophen-caffeine 250-250-65 MG tablet Commonly known as: EXCEDRIN MIGRAINE Take 2 tablets by mouth every 8 (eight) hours as needed for migraine.   DULoxetine 30 MG capsule Commonly known as: CYMBALTA Take 1 capsule (30 mg total) by mouth daily.   fexofenadine 180 MG tablet Commonly known as: ALLEGRA Take 180 mg by mouth daily.   ibuprofen 200 MG tablet Commonly known as: ADVIL Take 400 mg by mouth every 8 (eight) hours as needed for moderate pain.  iron polysaccharides 150 MG capsule Commonly known as: NIFEREX Take 150 mg by mouth daily.   meloxicam 15 MG tablet Commonly known as: MOBIC Take 1 tablet (15 mg total) by mouth daily.   metoprolol tartrate 50 MG tablet Commonly known as: LOPRESSOR Take 1 tablet (50 mg total) by mouth daily.   omeprazole 40 MG capsule Commonly known as: PRILOSEC TAKE 1 CAPSULE BY MOUTH EVERY DAY   oxybutynin 15 MG 24 hr tablet Commonly known as: DITROPAN XL Take 1 tablet (15 mg total) by mouth at bedtime.   PERCOGESIC PO Take 1 tablet by mouth daily as needed (pain).   triamterene-hydrochlorothiazide 75-50 MG tablet Commonly known as: MAXZIDE Take 1 tablet by mouth every evening.       Past Surgical History:  She  has a past surgical history that includes Tonsillectomy; Abdominal hysterectomy;  Cardiac catheterization (~ 2015); laparoscopic appendectomy (N/A, 05/06/2016); Cardiac catheterization (2015); and Chilectomy (Right, 06/01/2017).  Family History:  Her family history includes Asthma in her father and mother; Diabetes in her father, mother, and sister; Heart disease in her father and mother; High blood pressure in her father and mother.  Social History:  She  reports that she quit smoking about 40 years ago. Her smoking use included cigarettes. She has a 0.60 pack-year smoking history. She has never used smokeless tobacco. She reports that she does not drink alcohol or use drugs.

## 2018-11-01 ENCOUNTER — Other Ambulatory Visit: Payer: Medicare HMO

## 2018-11-04 ENCOUNTER — Other Ambulatory Visit: Payer: Self-pay | Admitting: Family Medicine

## 2018-11-06 ENCOUNTER — Encounter: Payer: Self-pay | Admitting: Family Medicine

## 2018-11-06 ENCOUNTER — Other Ambulatory Visit: Payer: Self-pay | Admitting: Family Medicine

## 2018-11-06 MED ORDER — ESOMEPRAZOLE MAGNESIUM 40 MG PO CPDR: 40.0000 mg | DELAYED_RELEASE_CAPSULE | Freq: Every day | ORAL | 3 refills | Status: DC

## 2018-11-08 ENCOUNTER — Ambulatory Visit (INDEPENDENT_AMBULATORY_CARE_PROVIDER_SITE_OTHER): Payer: Medicare HMO | Admitting: Podiatry

## 2018-11-08 ENCOUNTER — Other Ambulatory Visit: Payer: Self-pay

## 2018-11-08 DIAGNOSIS — L989 Disorder of the skin and subcutaneous tissue, unspecified: Secondary | ICD-10-CM | POA: Diagnosis not present

## 2018-11-08 DIAGNOSIS — M79676 Pain in unspecified toe(s): Secondary | ICD-10-CM | POA: Diagnosis not present

## 2018-11-08 DIAGNOSIS — B351 Tinea unguium: Secondary | ICD-10-CM

## 2018-11-11 NOTE — Progress Notes (Signed)
    Subjective: Patient is a 68 y.o. female presenting to the office today with a chief complaint of painful callus lesions noted to the bilateral feet that have been present for the past several months. Walking and bearing weight increases the pain. She has not had any recent treatment. Patient also complains of elongated, thickened nails that cause pain while ambulating in shoes. She is unable to trim her own nails. Patient presents today for further treatment and evaluation.  Past Medical History:  Diagnosis Date  . Anemia   . Anxiety   . Arthritis    "knees" (04/26/2016)  . Colon polyps    benign per pt  . Coronary artery disease    mild per 2015 cath in [place] (OM1 30%, RCA 30%)  . GERD (gastroesophageal reflux disease)   . Gout   . History of hiatal hernia   . Hypertension   . Migraine    "none since early /2017" (05/06/2016)  . Obesity   . OSA on CPAP    uses CPAP  . Pre-diabetes     Objective:  Physical Exam General: Alert and oriented x3 in no acute distress  Dermatology: Hyperkeratotic lesions present on the bilateral feet x 4. Pain on palpation with a central nucleated core noted. Skin is warm, dry and supple bilateral lower extremities. Negative for open lesions or macerations. Nails are tender, long, thickened and dystrophic with subungual debris, consistent with onychomycosis, 1-5 bilateral. No signs of infection noted.  Vascular: Palpable pedal pulses bilaterally. No edema or erythema noted. Capillary refill within normal limits.  Neurological: Epicritic and protective threshold grossly intact bilaterally.   Musculoskeletal Exam: Pain on palpation at the keratotic lesions noted. Range of motion within normal limits bilateral. Muscle strength 5/5 in all groups bilateral.  Assessment: 1. Onychodystrophic nails 1-5 bilateral with hyperkeratosis of nails.  2. Onychomycosis of nail due to dermatophyte bilateral 3. Pre-ulcerative callus lesions noted to the  bilateral feet x 4   Plan of Care:  1. Patient evaluated. 2. Excisional debridement of keratoic lesion using a chisel blade was performed without incident.  3. Dressed with light dressing. 4. Mechanical debridement of nails 1-5 bilaterally performed using a nail nipper. Filed with dremel without incident.  5. Patient is to return to the clinic in 3 months.   Edrick Kins, DPM [facility]  Dr. Edrick Kins, [address]                                        [place], [place] [address]                Office [phone]  Fax [phone]

## 2018-11-20 DIAGNOSIS — G4733 Obstructive sleep apnea (adult) (pediatric): Secondary | ICD-10-CM | POA: Diagnosis not present

## 2018-12-21 DIAGNOSIS — G4733 Obstructive sleep apnea (adult) (pediatric): Secondary | ICD-10-CM | POA: Diagnosis not present

## 2018-12-22 ENCOUNTER — Encounter: Payer: Self-pay | Admitting: Family Medicine

## 2018-12-29 DIAGNOSIS — R69 Illness, unspecified: Secondary | ICD-10-CM | POA: Diagnosis not present

## 2019-01-15 ENCOUNTER — Other Ambulatory Visit: Payer: Self-pay | Admitting: Family Medicine

## 2019-01-15 MED ORDER — DULOXETINE HCL 30 MG PO CPEP: 30.0000 mg | ORAL_CAPSULE | Freq: Every day | ORAL | 0 refills | Status: DC

## 2019-01-18 ENCOUNTER — Other Ambulatory Visit: Payer: Self-pay | Admitting: Family Medicine

## 2019-01-21 DIAGNOSIS — G4733 Obstructive sleep apnea (adult) (pediatric): Secondary | ICD-10-CM | POA: Diagnosis not present

## 2019-01-25 DIAGNOSIS — R69 Illness, unspecified: Secondary | ICD-10-CM | POA: Diagnosis not present

## 2019-01-31 ENCOUNTER — Ambulatory Visit (INDEPENDENT_AMBULATORY_CARE_PROVIDER_SITE_OTHER): Payer: Medicare HMO | Admitting: Family Medicine

## 2019-01-31 ENCOUNTER — Other Ambulatory Visit: Payer: Self-pay

## 2019-01-31 ENCOUNTER — Encounter: Payer: Self-pay | Admitting: Family Medicine

## 2019-01-31 VITALS — BP 128/80 | HR 102 | Temp 98.0°F | Ht 61.5 in | Wt 316.0 lb

## 2019-01-31 DIAGNOSIS — L03119 Cellulitis of unspecified part of limb: Secondary | ICD-10-CM | POA: Diagnosis not present

## 2019-01-31 DIAGNOSIS — R600 Localized edema: Secondary | ICD-10-CM

## 2019-01-31 MED ORDER — DOXYCYCLINE HYCLATE 100 MG PO TABS: 100.0000 mg | ORAL_TABLET | Freq: Two times a day (BID) | ORAL | 0 refills | Status: DC

## 2019-01-31 MED ORDER — TRAMADOL HCL 50 MG PO TABS: 50.0000 mg | ORAL_TABLET | Freq: Two times a day (BID) | ORAL | 0 refills | Status: DC | PRN

## 2019-01-31 NOTE — Patient Instructions (Addendum)
OK to take Tylenol 1000 mg (2 extra strength tabs) or 975 mg (3 regular strength tabs) every 6 hours as needed.  Ice/cold pack over area for 10-15 min twice daily.  Things to look out for: increasing pain not relieved by ibuprofen/acetaminophen, fevers, spreading redness, drainage of pus, or foul odor.  Do not drink alcohol, do any illicit/street drugs, drive or do anything that requires alertness while on the tramadol.   Let us know if you need anything.

## 2019-01-31 NOTE — Progress Notes (Signed)
Chief Complaint  Patient presents with  . Leg Swelling  . Cellulitis    Christine [name] is a 68 y.o. female here for a skin complaint.  Duration: 2 days Location: LE ext's Pruritic? No Painful? Yes Drainage? Yes- clear drainage from blisters arising New soaps/lotions/topicals/detergents? No Other associated symptoms: spreading redness Therapies tried thus far: none  Needs new compression stockings for chronic LE edema and venous stasis. Requesting rx. No calf pain.   ROS:  Const: No fevers Skin: As noted in HPI  Past Medical History:  Diagnosis Date  . Anemia   . Anxiety   . Arthritis    "knees" (04/26/2016)  . Colon polyps    benign per pt  . Coronary artery disease    mild per 2015 cath in [place] (OM1 30%, RCA 30%)  . GERD (gastroesophageal reflux disease)   . Gout   . History of hiatal hernia   . Hypertension   . Migraine    "none since early /2017" (05/06/2016)  . Obesity   . OSA on CPAP    uses CPAP  . Pre-diabetes     BP 128/80 (BP Location: Left Arm, Patient Position: Sitting, Cuff Size: Large)   Pulse (!) 102   Temp 98 F (36.7 C) (Temporal)   Ht 5' 1.5" (1.562 m)   Wt (!) 316 lb (143.3 kg)   SpO2 95%   BMI 58.74 kg/m  Gen: awake, alert, appearing stated age Lungs: No accessory muscle use Skin: See below. +warmth and ttp; No active drainage, fluctuance, excoriation Psych: Age appropriate judgment and insight        Cellulitis of lower extremity, unspecified laterality - Plan: doxycycline (VIBRA-TABS) 100 MG tablet, traMADol (ULTRAM) 50 MG tablet  Lower extremity edema  1- Doxy for 10 d, tramadol prn. Using Mobic and Tylenol as well. Ice. Warning signs and symptoms verbalized and written down in AVS.  2- Rx for compression stockings provided.  F/u prn. The patient voiced understanding and agreement to the plan.  [place], DO 01/31/19 3:35 PM

## 2019-02-03 ENCOUNTER — Encounter: Payer: Self-pay | Admitting: Family Medicine

## 2019-02-04 ENCOUNTER — Encounter: Payer: Self-pay | Admitting: Family Medicine

## 2019-02-05 ENCOUNTER — Other Ambulatory Visit: Payer: Self-pay

## 2019-02-05 ENCOUNTER — Ambulatory Visit (HOSPITAL_COMMUNITY)
Admission: EM | Admit: 2019-02-05 | Discharge: 2019-02-05 | Disposition: A | Discharge status: Home / Self Care | Payer: Medicare HMO

## 2019-02-05 ENCOUNTER — Other Ambulatory Visit: Payer: Self-pay | Admitting: Family Medicine

## 2019-02-05 MED ORDER — HYDROCODONE-ACETAMINOPHEN 7.5-325 MG PO TABS: 1.0000 | ORAL_TABLET | Freq: Four times a day (QID) | ORAL | 0 refills | Status: DC | PRN

## 2019-02-12 ENCOUNTER — Encounter: Payer: Self-pay | Admitting: Family Medicine

## 2019-02-14 ENCOUNTER — Other Ambulatory Visit: Payer: Self-pay

## 2019-02-14 ENCOUNTER — Encounter: Payer: Self-pay | Admitting: Podiatry

## 2019-02-14 ENCOUNTER — Ambulatory Visit (INDEPENDENT_AMBULATORY_CARE_PROVIDER_SITE_OTHER): Payer: Medicare HMO | Admitting: Podiatry

## 2019-02-14 DIAGNOSIS — B351 Tinea unguium: Secondary | ICD-10-CM

## 2019-02-14 DIAGNOSIS — L989 Disorder of the skin and subcutaneous tissue, unspecified: Secondary | ICD-10-CM

## 2019-02-14 DIAGNOSIS — M79676 Pain in unspecified toe(s): Secondary | ICD-10-CM | POA: Diagnosis not present

## 2019-02-18 NOTE — Progress Notes (Signed)
    Subjective: Patient is a 68 y.o. female presenting to the office today for follow up evaluation of painful callus lesions noted to the bilateral feet. Walking and bearing weight increases the pain. She has not had any recent treatment. Patient also complains of elongated, thickened nails that cause pain while ambulating in shoes. She is unable to trim her own nails. Patient presents today for further treatment and evaluation.  Past Medical History:  Diagnosis Date  . Anemia   . Anxiety   . Arthritis    "knees" (04/26/2016)  . Colon polyps    benign per pt  . Coronary artery disease    mild per 2015 cath in [place] (OM1 30%, RCA 30%)  . GERD (gastroesophageal reflux disease)   . Gout   . History of hiatal hernia   . Hypertension   . Migraine    "none since early /2017" (05/06/2016)  . Obesity   . OSA on CPAP    uses CPAP  . Pre-diabetes     Objective:  Physical Exam General: Alert and oriented x3 in no acute distress  Dermatology: Hyperkeratotic lesions present on the bilateral feet x 4. Pain on palpation with a central nucleated core noted. Skin is warm, dry and supple bilateral lower extremities. Negative for open lesions or macerations. Nails are tender, long, thickened and dystrophic with subungual debris, consistent with onychomycosis, 1-5 bilateral. No signs of infection noted.  Vascular: Palpable pedal pulses bilaterally. No edema or erythema noted. Capillary refill within normal limits.  Neurological: Epicritic and protective threshold grossly intact bilaterally.   Musculoskeletal Exam: Pain on palpation at the keratotic lesions noted. Range of motion within normal limits bilateral. Muscle strength 5/5 in all groups bilateral.  Assessment: 1. Onychodystrophic nails 1-5 bilateral with hyperkeratosis of nails.  2. Onychomycosis of nail due to dermatophyte bilateral 3. Pre-ulcerative callus lesions noted to the bilateral feet x 4   Plan of Care:  1. Patient  evaluated. 2. Excisional debridement of keratoic lesion using a chisel blade was performed without incident.  3. Dressed with light dressing. 4. Mechanical debridement of nails 1-5 bilaterally performed using a nail nipper. Filed with dremel without incident.  5. Patient is to return to the clinic in 3 months.   Edrick Kins, DPM [facility]  Dr. Edrick Kins, [address]                                        [place], [place] [address]                Office [phone]  Fax [phone]

## 2019-02-19 ENCOUNTER — Ambulatory Visit (INDEPENDENT_AMBULATORY_CARE_PROVIDER_SITE_OTHER): Payer: Medicare HMO | Admitting: Family Medicine

## 2019-02-19 ENCOUNTER — Encounter: Payer: Self-pay | Admitting: Family Medicine

## 2019-02-19 ENCOUNTER — Other Ambulatory Visit: Payer: Self-pay

## 2019-02-19 DIAGNOSIS — M79662 Pain in left lower leg: Secondary | ICD-10-CM

## 2019-02-19 DIAGNOSIS — M79661 Pain in right lower leg: Secondary | ICD-10-CM | POA: Diagnosis not present

## 2019-02-19 MED ORDER — IBUPROFEN 800 MG PO TABS: 800.0000 mg | ORAL_TABLET | Freq: Three times a day (TID) | ORAL | 0 refills | Status: DC | PRN

## 2019-02-19 NOTE — Progress Notes (Addendum)
Chief Complaint  Patient presents with  . Follow-up    Subjective: Patient is a 68 y.o. female here for calf pain. Due to COVID-19 pandemic, we are interacting via web portal for an electronic face-to-face visit. I verified patient's ID using 2 identifiers. Patient agreed to proceed with visit via this method. Patient is at home, I am at office. Patient and I are present for visit.   Patient has a history of a DVT.  She is not currently on anticoagulation.  She was recently diagnosed with cellulitis and after the skin issue resolved, she continued to have calf pain bilaterally.  The right side is worse.  There is no swelling.  A similar event happened in the past with cellulitis preceding a DVT.  There is a questionable family history of clotting but she is unsure exactly.  She denies any chest pain, shortness of breath, or cough.  No recent travel, injury, or prolonged bedrest.  ROS: Heart: Denies chest pain  Lungs: Denies SOB   Past Medical History:  Diagnosis Date  . Anemia   . Anxiety   . Arthritis    "knees" (04/26/2016)  . Colon polyps    benign per pt  . Coronary artery disease    mild per 2015 cath in [place] (OM1 30%, RCA 30%)  . GERD (gastroesophageal reflux disease)   . Gout   . History of hiatal hernia   . Hypertension   . Migraine    "none since early /2017" (05/06/2016)  . Obesity   . OSA on CPAP    uses CPAP  . Pre-diabetes     Objective: No conversational dyspnea Age appropriate judgment and insight Nml affect and mood  Assessment and Plan: Bilateral calf pain - Plan: US Venous Img Lower Bilateral  Given hx, will ck Dopplers. No interested in D dimer.  F/u prn.  The patient voiced understanding and agreement to the plan.  Mila Doce, DO 02/19/19  2:29 PM

## 2019-02-20 DIAGNOSIS — G4733 Obstructive sleep apnea (adult) (pediatric): Secondary | ICD-10-CM | POA: Diagnosis not present

## 2019-02-21 ENCOUNTER — Other Ambulatory Visit: Payer: Self-pay

## 2019-02-21 ENCOUNTER — Ambulatory Visit (HOSPITAL_BASED_OUTPATIENT_CLINIC_OR_DEPARTMENT_OTHER)
Admission: RE | Admit: 2019-02-21 | Discharge: 2019-02-21 | Disposition: A | Discharge status: Home / Self Care | Payer: Medicare HMO | Source: Ambulatory Visit | Attending: Family Medicine | Admitting: Family Medicine

## 2019-02-21 DIAGNOSIS — R600 Localized edema: Secondary | ICD-10-CM | POA: Diagnosis not present

## 2019-02-21 DIAGNOSIS — M79662 Pain in left lower leg: Secondary | ICD-10-CM | POA: Insufficient documentation

## 2019-02-21 DIAGNOSIS — M79661 Pain in right lower leg: Secondary | ICD-10-CM

## 2019-02-28 ENCOUNTER — Other Ambulatory Visit: Payer: Self-pay

## 2019-02-28 ENCOUNTER — Emergency Department (HOSPITAL_COMMUNITY): Payer: Medicare HMO

## 2019-02-28 ENCOUNTER — Emergency Department (HOSPITAL_COMMUNITY)
Admission: EM | Admit: 2019-02-28 | Discharge: 2019-02-28 | Disposition: A | Discharge status: Home / Self Care | Payer: Medicare HMO | Attending: Emergency Medicine | Admitting: Emergency Medicine

## 2019-02-28 DIAGNOSIS — Z87891 Personal history of nicotine dependence: Secondary | ICD-10-CM | POA: Diagnosis not present

## 2019-02-28 DIAGNOSIS — R1030 Lower abdominal pain, unspecified: Secondary | ICD-10-CM

## 2019-02-28 DIAGNOSIS — I2510 Atherosclerotic heart disease of native coronary artery without angina pectoris: Secondary | ICD-10-CM | POA: Insufficient documentation

## 2019-02-28 DIAGNOSIS — Z79899 Other long term (current) drug therapy: Secondary | ICD-10-CM | POA: Insufficient documentation

## 2019-02-28 DIAGNOSIS — R109 Unspecified abdominal pain: Secondary | ICD-10-CM | POA: Diagnosis not present

## 2019-02-28 DIAGNOSIS — I10 Essential (primary) hypertension: Secondary | ICD-10-CM | POA: Insufficient documentation

## 2019-02-28 LAB — COMPREHENSIVE METABOLIC PANEL
ALT: 19 U/L (ref 0–44)
AST: 25 U/L (ref 15–41)
Albumin: 3.5 g/dL (ref 3.5–5.0)
Alkaline Phosphatase: 72 U/L (ref 38–126)
Anion gap: 11 (ref 5–15)
BUN: 31 mg/dL — ABNORMAL HIGH (ref 8–23)
CO2: 22 mmol/L (ref 22–32)
Calcium: 9.4 mg/dL (ref 8.9–10.3)
Chloride: 108 mmol/L (ref 98–111)
Creatinine, Ser: 1.07 mg/dL — ABNORMAL HIGH (ref 0.44–1.00)
GFR calc Af Amer: >60 mL/min (ref >60)
GFR calc non Af Amer: 54 mL/min — ABNORMAL LOW (ref >60)
Glucose, Bld: 90 mg/dL (ref 70–99)
Potassium: 3.6 mmol/L (ref 3.5–5.1)
Sodium: 141 mmol/L (ref 135–145)
Total Bilirubin: 0.6 mg/dL (ref 0.3–1.2)
Total Protein: 7.6 g/dL (ref 6.5–8.1)

## 2019-02-28 LAB — URINALYSIS, ROUTINE W REFLEX MICROSCOPIC
Bilirubin Urine: NEGATIVE
Glucose, UA: NEGATIVE mg/dL
Hgb urine dipstick: NEGATIVE
Ketones, ur: NEGATIVE mg/dL
Leukocytes,Ua: NEGATIVE
Nitrite: NEGATIVE
Protein, ur: NEGATIVE mg/dL
Specific Gravity, Urine: 1.013 (ref 1.005–1.030)
pH: 7 (ref 5.0–8.0)

## 2019-02-28 LAB — CBC
HCT: 34.1 % — ABNORMAL LOW (ref 36.0–46.0)
Hemoglobin: 10.2 g/dL — ABNORMAL LOW (ref 12.0–15.0)
MCH: 25.6 pg — ABNORMAL LOW (ref 26.0–34.0)
MCHC: 29.9 g/dL — ABNORMAL LOW (ref 30.0–36.0)
MCV: 85.7 fL (ref 80.0–100.0)
Platelets: 292 10*3/uL (ref 150–400)
RBC: 3.98 MIL/uL (ref 3.87–5.11)
RDW: 16.5 % — ABNORMAL HIGH (ref 11.5–15.5)
WBC: 5.5 10*3/uL (ref 4.0–10.5)
nRBC: 0 % (ref 0.0–0.2)

## 2019-02-28 LAB — LIPASE, BLOOD: Lipase: 20 U/L (ref 11–51)

## 2019-02-28 MED ORDER — IOHEXOL 300 MG/ML  SOLN
100.0000 mL | Freq: Once | INTRAMUSCULAR | Status: AC | PRN
  Administered 2019-02-28: 100 mL via INTRAVENOUS

## 2019-02-28 MED ORDER — ONDANSETRON HCL 4 MG/2ML IJ SOLN
4.0000 mg | Freq: Once | INTRAMUSCULAR | Status: AC
  Administered 2019-02-28: 4 mg via INTRAVENOUS
  Filled 2019-02-28: qty 2

## 2019-02-28 MED ORDER — SODIUM CHLORIDE 0.9% FLUSH: 3.0000 mL | Freq: Once | INTRAVENOUS | Status: DC

## 2019-02-28 NOTE — ED Provider Notes (Signed)
[facility] EMERGENCY DEPARTMENT Provider Note   CSN: [id number] Arrival date & time: 02/28/19  [id number]     History   Chief Complaint Chief Complaint  Patient presents with  . Abdominal Pain    HPI Christine [name] is a 68 y.o. female.     68 year old female with past medical history below including CAD, hypertension, OSA, GERD who presents with abdominal pain.  Approximately 2-1/2 weeks ago, she began having pain in her lower abdomen which she states is in her "ovaries."  Pain has been off and on since then and she has had associated severe nausea.  She has not wanted to eat much and subsequently has lost 16 pounds in the past 2 weeks.  She reports some loose stools but no severe diarrhea and no blood.  She denies any urinary symptoms, fevers, vomiting, or cough.  She has been taking Pepto-Bismol for indigestion without relief.  The history is provided by the patient.  Abdominal Pain   Past Medical History:  Diagnosis Date  . Anemia   . Anxiety   . Arthritis    "knees" (04/26/2016)  . Colon polyps    benign per pt  . Coronary artery disease    mild per 2015 cath in [place] (OM1 30%, RCA 30%)  . GERD (gastroesophageal reflux disease)   . Gout   . History of hiatal hernia   . Hypertension   . Migraine    "none since early /2017" (05/06/2016)  . Obesity   . OSA on CPAP    uses CPAP  . Pre-diabetes     Patient Active Problem List   Diagnosis Date Noted  . Morbid obesity ([place]) 03/29/2018  . GAD (generalized anxiety disorder) 02/27/2018  . Mixed incontinence urge and stress 02/27/2018  . OSA on CPAP 06/16/2017  . Acute appendicitis 05/06/2016  . Gastro-esophageal reflux disease with esophagitis 07/26/2011  . Hypertension, benign 07/26/2011  . Osteoarthrosis 10/16/2009  . 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 Pre-operative cardiovascular examination 08/22/2008    Past Surgical  History:  Procedure Laterality Date  . ABDOMINAL HYSTERECTOMY     "partial"  . CARDIAC CATHETERIZATION  ~ 2015  . CARDIAC CATHETERIZATION  2015   In [place]  . CHILECTOMY Right 06/01/2017   Procedure: CHILECTOMY RIGHT FOOT;  Surgeon: Edrick Kins, DPM;  Location: [facility];  Service: Podiatry;  Laterality: Right;  . LAPAROSCOPIC APPENDECTOMY N/A 05/06/2016   Procedure: LAPAROSCOPIC APPENDECTOMY;  Surgeon: Donnie Mesa, MD;  Location: [facility];  Service: General;  Laterality: N/A;  . TONSILLECTOMY       OB History   No obstetric history on file.      Home Medications    Prior to Admission medications   Medication Sig Start Date End Date Taking? Authorizing Provider  acetaminophen (TYLENOL) 325 MG tablet Take 2 tablets (650 mg total) by mouth every 6 (six) hours as needed for mild pain (or temp > 100). 05/07/16   Earnstine Regal, PA-C  aspirin-acetaminophen-caffeine (EXCEDRIN MIGRAINE) [id number] MG tablet Take 2 tablets by mouth every 8 (eight) hours as needed for migraine.    [provider]  Diphenhydramine-Acetaminophen (PERCOGESIC PO) Take 1 tablet by mouth daily as needed (pain).     [provider]  DULoxetine (CYMBALTA) 30 MG capsule Take 1 capsule (30 mg total) by mouth daily. 01/15/19   Shelda Pal, DO  esomeprazole (NEXIUM) 40 MG capsule Take 1 capsule (40  mg total) by mouth daily. 11/06/18   Wendling, Crosby Oyster, DO  fexofenadine (ALLEGRA) 180 MG tablet Take 180 mg by mouth daily.    [provider]  ibuprofen (ADVIL) 800 MG tablet Take 1 tablet (800 mg total) by mouth every 8 (eight) hours as needed. 02/19/19   Shelda Pal, DO  iron polysaccharides (NIFEREX) 150 MG capsule Take 150 mg by mouth daily.    [provider]  meloxicam (MOBIC) 15 MG tablet Take 1 tablet (15 mg total) by mouth daily. 10/23/18   Edrick Kins, DPM  metoprolol tartrate (LOPRESSOR) 50 MG tablet TAKE 1 TABLET BY MOUTH EVERY DAY 11/06/18    Wendling, Crosby Oyster, DO  oxybutynin (DITROPAN XL) 15 MG 24 hr tablet Take 1 tablet (15 mg total) by mouth at bedtime. 10/25/18   Shelda Pal, DO  triamterene-hydrochlorothiazide (MAXZIDE) 75-50 MG tablet TAKE 1 TABLET BY MOUTH EVERY DAY IN THE EVENING 01/18/19   Shelda Pal, DO    Family History Family History  Problem Relation Age of Onset  . Diabetes Mother   . Heart disease Mother   . High blood pressure Mother   . Asthma Mother   . Diabetes Father   . Heart disease Father   . High blood pressure Father   . Asthma Father   . Diabetes Sister   . Colon cancer Neg Hx   . Esophageal cancer Neg Hx   . Rectal cancer Neg Hx     Social History Social History   Tobacco Use  . Smoking status: Former Smoker    Packs/day: 0.12    Years: 5.00    Pack years: 0.60    Types: Cigarettes    Quit date: 1980    Years since quitting: 40.8  . Smokeless tobacco: Never Used  Substance Use Topics  . Alcohol use: No  . Drug use: No     Allergies   Prednisone, Tape, and Sulfa antibiotics   Review of Systems Review of Systems  Gastrointestinal: Positive for abdominal pain.   All other systems reviewed and are negative except that which was mentioned in HPI   Physical Exam Updated Vital Signs BP 137/76   Pulse 65   Temp 97.9 F (36.6 C) (Oral)   Resp 18   SpO2 97%   Physical Exam Vitals signs and nursing note reviewed.  Constitutional:      General: She is not in acute distress.    Appearance: She is well-developed.  HENT:     Head: Normocephalic and atraumatic.  Eyes:     Conjunctiva/sclera: Conjunctivae normal.  Neck:     Musculoskeletal: Neck supple.  Cardiovascular:     Rate and Rhythm: Normal rate and regular rhythm.     Heart sounds: Normal heart sounds. No murmur.  Pulmonary:     Effort: Pulmonary effort is normal.     Breath sounds: Normal breath sounds.  Abdominal:     General: Bowel sounds are normal. There is no distension.      Palpations: Abdomen is soft.     Tenderness: There is abdominal tenderness in the right lower quadrant, epigastric area and left upper quadrant. There is no guarding or rebound.  Skin:    General: Skin is warm and dry.     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     Comments: Fluent speech  Psychiatric:        Judgment: Judgment normal.      ED Treatments / Results  Labs (all labs ordered are listed, but only abnormal results are displayed) Labs Reviewed  COMPREHENSIVE METABOLIC PANEL - Abnormal; Notable for the following components:      Result Value   BUN 31 (*)    Creatinine, Ser 1.07 (*)    GFR calc non Af Amer 54 (*)    All other components within normal limits  CBC - Abnormal; Notable for the following components:   Hemoglobin 10.2 (*)    HCT 34.1 (*)    MCH 25.6 (*)    MCHC 29.9 (*)    RDW 16.5 (*)    All other components within normal limits  URINALYSIS, ROUTINE W REFLEX MICROSCOPIC - Abnormal; Notable for the following components:   APPearance HAZY (*)    All other components within normal limits  LIPASE, BLOOD    EKG EKG Interpretation  Date/Time:  Wednesday February 28 2019 10:39:38 EST Ventricular Rate:  66 PR Interval:  190 QRS Duration: 90 QT Interval:  400 QTC Calculation: 419 R Axis:   76 Text Interpretation: Normal sinus rhythm Normal ECG No significant change since last tracing Confirmed by Theotis Burrow [phone]) on 02/28/2019 12:29:59 PM   Radiology Ct Abdomen Pelvis W Contrast  Result Date: 02/28/2019 CLINICAL DATA:  Abdominal pain EXAM: CT ABDOMEN AND PELVIS WITH CONTRAST TECHNIQUE: Multidetector CT imaging of the abdomen and pelvis was performed using the standard protocol following bolus administration of intravenous contrast. CONTRAST:  110mL OMNIPAQUE IOHEXOL 300 MG/ML  SOLN COMPARISON:  2018 FINDINGS: Lower chest:  Minimal subsegmental atelectasis. Hepatobiliary: No focal liver abnormality is seen. No gallstones, gallbladder wall thickening, or biliary dilatation. Pancreas: Unremarkable. No pancreatic ductal dilatation or surrounding inflammatory changes. Spleen: Normal in size without focal abnormality. Adrenals/Urinary Tract: Unchanged left renal cyst. Otherwise unremarkable. Stomach/Bowel: Interval removal of appendix. Bowel is normal in caliber. There is no evidence of diverticulitis. Vascular/Lymphatic: No lymphadenopathy. Reproductive: No pelvic or adnexal mass. Other: No ascites. Musculoskeletal: No acute or significant osseous findings. IMPRESSION: No findings to account for reported pain. Specifically, no evidence of acute diverticulitis. Electronically Signed   By: Macy Mis M.D.   On: 02/28/2019 14:55    Procedures Procedures (including critical care time)  Medications Ordered in ED Medications  sodium chloride flush (NS) 0.9 % injection 3 mL (has no administration in time range)  ondansetron (ZOFRAN) injection 4 mg (4 mg Intravenous Given 02/28/19 1351)  iohexol (OMNIPAQUE) 300 MG/ML solution 100 mL (100 mLs Intravenous Contrast Given 02/28/19 1430)     Initial Impression / Assessment and Plan / ED Course  I have reviewed the triage vital signs and the nursing notes.  Pertinent labs & imaging results that were available during my care of the patient were reviewed by me and considered in my medical decision making (see chart for details).       Nontoxic on exam, afebrile with reassuring vital signs.  Several areas abdominal tenderness noted.  Labs show reassuring CMP, lipase, and CBC.  UA without evidence of infection.  Obtain CT to evaluate for diverticulitis or other acute intra-abdominal process.  CT unremarkable, shows no evidence of diverticulitis.  Patient ambulatory and well-appearing on reassessment.  I discussed supportive measures and encouraged her to follow-up with her PCP in a  few days as she may need further testing if symptoms continue or worsen.  I have reviewed return precautions and she voiced  understanding.  Final Clinical Impressions(s) / ED Diagnoses   Final diagnoses:  Lower abdominal pain    ED Discharge Orders    None       Yuya Vanwingerden, Wenda Overland, MD 02/28/19 1556

## 2019-02-28 NOTE — ED Notes (Signed)
"  Patient verbalizes understanding of discharge instructions. Opportunity for questioning and answers were provided.  pt discharged from ED ."  

## 2019-02-28 NOTE — ED Triage Notes (Signed)
Reports "sudden onset of pain to both ovaries 2 1/2 weeks ago and ovaries haven't felt right since."  C/o pain to bilateral lower quadrants with nausea.  16 lbs weight loss in 2 1/2 weeks.  Denies vomiting.  Loose stools.  Also reports indigestion that is unrelieved with Pepto Bismol.

## 2019-03-02 ENCOUNTER — Other Ambulatory Visit: Payer: Self-pay

## 2019-03-05 ENCOUNTER — Encounter: Payer: Self-pay | Admitting: Family Medicine

## 2019-03-05 ENCOUNTER — Other Ambulatory Visit: Payer: Self-pay

## 2019-03-05 ENCOUNTER — Ambulatory Visit (INDEPENDENT_AMBULATORY_CARE_PROVIDER_SITE_OTHER): Payer: Medicare HMO | Admitting: Family Medicine

## 2019-03-05 VITALS — BP 122/76 | HR 101 | Temp 96.5°F | Ht 61.5 in | Wt 302.5 lb

## 2019-03-05 DIAGNOSIS — R110 Nausea: Secondary | ICD-10-CM | POA: Diagnosis not present

## 2019-03-05 DIAGNOSIS — R1030 Lower abdominal pain, unspecified: Secondary | ICD-10-CM | POA: Diagnosis not present

## 2019-03-05 MED ORDER — ONDANSETRON 4 MG PO TBDP: 4.0000 mg | ORAL_TABLET | Freq: Three times a day (TID) | ORAL | 0 refills | Status: DC | PRN

## 2019-03-05 NOTE — Patient Instructions (Signed)
We will be in touch regarding your [place] results. If there is nothing notable, we will get you set up with our GI team. If there is, we will direct you to your gynecologist.   Mind food triggers.  Stay hydrated.  Let us know if you need anything.

## 2019-03-05 NOTE — Progress Notes (Signed)
Chief Complaint  Patient presents with  . Follow-up    Subjective: Patient is a 68 y.o. female here for ED f/u.  Patient has been having 3 weeks of lower abdominal pain.  She went to the emergency department on 11/4 and had a negative CT scan.  No injury or change in activity.  There are no skin lesions over the area.  She has a history of an abdominal hysterectomy but her ovaries are still there.  She does have a history of ovarian cyst.  Her bowel movements were normal leading up to the ER visit, however since then she has had 1 small bowel movement.  Decreased appetite and around 15 pounds weight loss.  It does not wake her up at night.  Denies any fevers, bleeding, vomiting.  Is having nausea.  Eating and drinking seem to exacerbate her symptoms.  No urinary complaints.  ROS:  Constitutional: No fevers GI: As noted in HPI  Past Medical History:  Diagnosis Date  . Anemia   . Anxiety   . Arthritis    "knees" (04/26/2016)  . Colon polyps    benign per pt  . Coronary artery disease    mild per 2015 cath in [place] (OM1 30%, RCA 30%)  . GERD (gastroesophageal reflux disease)   . Gout   . History of hiatal hernia   . Hypertension   . Migraine    "none since early /2017" (05/06/2016)  . Obesity   . OSA on CPAP    uses CPAP  . Pre-diabetes     Objective: BP 122/76 (BP Location: Left Arm, Patient Position: Sitting, Cuff Size: Large)   Pulse (!) 101   Temp (!) 96.5 F (35.8 C) (Temporal)   Ht 5' 1.5" (1.562 m)   Wt (!) 302 lb 8 oz (137.2 kg)   SpO2 94%   BMI 56.23 kg/m  General: Awake, appears stated age HEENT: MMM, EOMi Heart: RRR, no murmurs Lungs: CTAB, no rales, wheezes or rhonchi. No accessory muscle use Abd: BS+, S, TTP in lower quadrants, neg Murphy's Psych: Age appropriate judgment and insight, normal affect and mood  Assessment and Plan: Lower abdominal pain - Plan: US Transvaginal Non-OB, US Pelvis Complete  Nausea - Plan: ondansetron (ZOFRAN-ODT) 4 MG  disintegrating tablet  Ck [place]. If neg, will refer to GI. If large cysts, will direct her to GYN. Zofran prn.  F/u prn.  The patient voiced understanding and agreement to the plan.  [place], DO 03/05/19  10:21 AM

## 2019-03-06 ENCOUNTER — Ambulatory Visit (HOSPITAL_BASED_OUTPATIENT_CLINIC_OR_DEPARTMENT_OTHER)
Admission: RE | Admit: 2019-03-06 | Discharge: 2019-03-06 | Disposition: A | Discharge status: Home / Self Care | Payer: Medicare HMO | Source: Ambulatory Visit | Attending: Family Medicine | Admitting: Family Medicine

## 2019-03-06 ENCOUNTER — Other Ambulatory Visit: Payer: Self-pay | Admitting: Family Medicine

## 2019-03-06 DIAGNOSIS — R1030 Lower abdominal pain, unspecified: Secondary | ICD-10-CM

## 2019-03-06 DIAGNOSIS — R1032 Left lower quadrant pain: Secondary | ICD-10-CM | POA: Diagnosis not present

## 2019-03-14 DIAGNOSIS — R69 Illness, unspecified: Secondary | ICD-10-CM | POA: Diagnosis not present

## 2019-03-16 ENCOUNTER — Ambulatory Visit: Payer: Medicare HMO | Admitting: Gastroenterology

## 2019-03-16 ENCOUNTER — Encounter: Payer: Self-pay | Admitting: Gastroenterology

## 2019-03-16 ENCOUNTER — Other Ambulatory Visit: Payer: Self-pay

## 2019-03-16 VITALS — BP 132/78 | HR 78 | Temp 98.6°F | Ht 61.5 in | Wt 305.0 lb

## 2019-03-16 DIAGNOSIS — R109 Unspecified abdominal pain: Secondary | ICD-10-CM | POA: Diagnosis not present

## 2019-03-16 MED ORDER — DICYCLOMINE HCL 20 MG PO TABS: 20.0000 mg | ORAL_TABLET | Freq: Two times a day (BID) | ORAL | 1 refills | Status: DC

## 2019-03-16 NOTE — Patient Instructions (Signed)
If you are age 66 or older, your body mass index should be between 23-30. Your Body mass index is 56.7 kg/m. If this is out of the aforementioned range listed, please consider follow up with your Primary Care Provider.  If you are age 46 or younger, your body mass index should be between 19-25. Your Body mass index is 56.7 kg/m. If this is out of the aformentioned range listed, please consider follow up with your Primary Care Provider.   We have sent the following medications to your pharmacy for you to pick up at your convenience:  Dicyclomine 20mg  1 tablet twice a day.   Thank you for choosing me and [facility]

## 2019-03-16 NOTE — Progress Notes (Addendum)
03/16/2019 Christine [name] [id number] 1950-07-19   HISTORY OF PRESENT ILLNESS: This is a 68 year old female who is a patient of Dr. Corena Pilgrim for evaluation of anemia in 2019.  Not thought to be from GI blood loss.  Hemoccult negative x 3.  Saw hematology and said likely anemia of chronic disease after extensive lab studies.  She has been referred here by her PCP, Dr. Nani Ravens, on this occasion for evaluation of lower abdominal pain.  She tells me that she had an episode of sharp left lower quadrant abdominal pain.  Now comes and goes, uncomfortable.  Lost about 10 pounds recently.  No associated bowel issues, nausea, vomiting, fever, chills.  No sign of GI bleeding.  CT scan of the abdomen pelvis with contrast on November 4 and then pelvic ultrasounds on November 10 unremarkable.  Urinalysis unremarkable.  Lipase and CMP normal.  Hemoglobin appears chronically low in the 9.5 to 10.5 g range.  Stable.  MCV is normal.  I did find some information in Care Everywhere.  There was some notes from Dr. Remi Haggard from the [facility] in [place], [place].  There was a note that she underwent EGD and colonoscopy in July 2015 that showed hemorrhoids on colonoscopy but otherwise normal.  EGD was unremarkable and biopsies for H. pylori were negative.  The notes from Dr. Sima Matas also report that the patient had colonoscopy by Dr. Charisse March in 2009 at which time there was only internal hemorrhoids but was otherwise unremarkable.  Repeat at that time was recommended in 10 years.  She says then she had an EGD by Dr. Milinda Pointer in 2012 and was found to only have some mild gastritis, otherwise unremarkable.  She tells me that she actually had several colonoscopies within a short period of time at different facilities.   Past Medical History:  Diagnosis Date  . Anemia   . Anxiety   . Arthritis    "knees" (04/26/2016)  . Colon polyps    benign per pt  . Coronary artery  disease    mild per 2015 cath in [place] (OM1 30%, RCA 30%)  . GERD (gastroesophageal reflux disease)   . Gout   . History of hiatal hernia   . Hypertension   . Migraine    "none since early /2017" (05/06/2016)  . Obesity   . OSA on CPAP    uses CPAP  . Pre-diabetes    Past Surgical History:  Procedure Laterality Date  . ABDOMINAL HYSTERECTOMY     "partial"; both ovaries present  . CARDIAC CATHETERIZATION  ~ 2015  . CARDIAC CATHETERIZATION  2015   In [place]  . CHILECTOMY Right 06/01/2017   Procedure: CHILECTOMY RIGHT FOOT;  Surgeon: Edrick Kins, DPM;  Location: [place];  Service: Podiatry;  Laterality: Right;  . LAPAROSCOPIC APPENDECTOMY N/A 05/06/2016   Procedure: LAPAROSCOPIC APPENDECTOMY;  Surgeon: Donnie Mesa, [place];  Location: [place];  Service: General;  Laterality: N/A;  . TONSILLECTOMY      reports that she quit smoking about 40 years ago. Her smoking use included cigarettes. She has a 0.60 pack-year smoking history. She has never used smokeless tobacco. She reports that she does not drink alcohol or use drugs. family history includes Asthma in her father and mother; Diabetes in her father, mother, and sister; Heart disease in her father and mother; High blood pressure in her father and mother. Allergies  Allergen Reactions  . Prednisone Swelling  Legs swell   . Tape   . Sulfa Antibiotics Rash      Outpatient Encounter Medications as of 03/16/2019  Medication Sig  . acetaminophen (TYLENOL) 325 MG tablet Take 2 tablets (650 mg total) by mouth every 6 (six) hours as needed for mild pain (or temp > 100).  Marland Kitchen aspirin-acetaminophen-caffeine (EXCEDRIN MIGRAINE) 250-250-65 MG tablet Take 2 tablets by mouth every 8 (eight) hours as needed for migraine.  . Diphenhydramine-Acetaminophen (PERCOGESIC PO) Take 1 tablet by mouth daily as needed (pain).   . DULoxetine (CYMBALTA) 30 MG capsule Take 1 capsule (30 mg total) by mouth daily.  Marland Kitchen esomeprazole (NEXIUM) 40 MG capsule Take 1  capsule (40 mg total) by mouth daily.  . fexofenadine (ALLEGRA) 180 MG tablet Take 180 mg by mouth daily.  Marland Kitchen ibuprofen (ADVIL) 800 MG tablet Take 1 tablet (800 mg total) by mouth every 8 (eight) hours as needed.  . iron polysaccharides (NIFEREX) 150 MG capsule Take 150 mg by mouth daily.  . meloxicam (MOBIC) 15 MG tablet Take 1 tablet (15 mg total) by mouth daily.  . metoprolol tartrate (LOPRESSOR) 50 MG tablet TAKE 1 TABLET BY MOUTH EVERY DAY  . ondansetron (ZOFRAN-ODT) 4 MG disintegrating tablet Take 1 tablet (4 mg total) by mouth every 8 (eight) hours as needed for nausea or vomiting.  Marland Kitchen oxybutynin (DITROPAN XL) 15 MG 24 hr tablet Take 1 tablet (15 mg total) by mouth at bedtime.  . triamterene-hydrochlorothiazide (MAXZIDE) 75-50 MG tablet TAKE 1 TABLET BY MOUTH EVERY DAY IN THE EVENING   No facility-administered encounter medications on file as of 03/16/2019.      REVIEW OF SYSTEMS  : All other systems reviewed and negative except where noted in the History of Present Illness.   PHYSICAL EXAM: BP 132/78   Pulse 78   Temp 98.6 F (37 C)   Ht 5' 1.5" (1.562 m)   Wt (!) 305 lb (138.3 kg)   BMI 56.70 kg/m  General: Well developed black female in no acute distress Head: Normocephalic and atraumatic Eyes:  Sclerae anicteric, conjunctiva pink. Ears: Normal auditory acuity Lungs: Clear throughout to auscultation; no increased WOB. Heart: Regular rate and rhythm; no M/R/G. Abdomen: Soft, non-distended.  BS present.  Non-tender. Musculoskeletal: Symmetrical with no gross deformities  Skin: No lesions on visible extremities Extremities: No edema  Neurological: Alert oriented x 4, grossly non-focal Psychological:  Alert and cooperative. Normal mood and affect  ASSESSMENT AND PLAN: *LLQ abdominal pain: I am going to give her dicyclomine 20 mg to take twice daily to treat abdominal spasm/cramping.  Negative CT scan, pelvic ultrasound, and laboratory evaluation.  She has had multiple  colonoscopies although the last was probably 2015 as stated above.  She will follow-up in about 4 to 6 weeks to reassess. *Chronic normocytic anemia dating back at least 2 years.  Iron studies in April 2019 look like probably anemia of chronic disease with normal ferritin, but low serum iron and iron saturation.  Is on iron supplementation at home.  Saw hematology and they thought AOCD after evaluation.  CC:  Christine [name]*

## 2019-03-23 DIAGNOSIS — G4733 Obstructive sleep apnea (adult) (pediatric): Secondary | ICD-10-CM | POA: Diagnosis not present

## 2019-03-28 ENCOUNTER — Ambulatory Visit (INDEPENDENT_AMBULATORY_CARE_PROVIDER_SITE_OTHER): Payer: Medicare HMO | Admitting: Obstetrics & Gynecology

## 2019-03-28 ENCOUNTER — Encounter: Payer: Self-pay | Admitting: Obstetrics & Gynecology

## 2019-03-28 ENCOUNTER — Other Ambulatory Visit: Payer: Self-pay

## 2019-03-28 VITALS — BP 144/74 | HR 70 | Ht 61.5 in

## 2019-03-28 DIAGNOSIS — Z01419 Encounter for gynecological examination (general) (routine) without abnormal findings: Secondary | ICD-10-CM | POA: Diagnosis not present

## 2019-03-28 NOTE — Progress Notes (Signed)
Subjective:     Christine [name] is a 68 y.o. female here for a routine exam. G0 Current complaints: annual GYN exam. NO GYN complaints. Pt does have leaking of urine. She has seen Dr. Nani [name] for urinary sx.     Gynecologic History No LMP recorded. Patient has had a hysterectomy. Contraception: status post hysterectomy Last Pap: > 11 year prev. S/p hyst Last mammogram: 10/06/2018. Results were: normal  Obstetric History OB History  Gravida Para Term Preterm AB Living  0 0 0 0 0 0  SAB TAB Ectopic Multiple Live Births  0 0 0 0 0   The following portions of the patient's history were reviewed and updated as appropriate: allergies, current medications, past family history, past medical history, past social history, past surgical history and problem list.  Review of Systems Pertinent items are noted in HPI.    Objective:  BP (!) 144/74   Pulse 70   Ht 5' 1.5" (1.562 m)   BMI 56.70 kg/m   General Appearance:    Alert, cooperative, no distress, appears stated age  Head:    Normocephalic, without obvious abnormality, atraumatic  Eyes:    conjunctiva/corneas clear, EOM's intact, both eyes  Ears:    Normal external ear canals, both ears  Nose:   Nares normal, septum midline, mucosa normal, no drainage    or sinus tenderness  Throat:   Lips, mucosa, and tongue normal; teeth and gums normal  Neck:   Supple, symmetrical, trachea midline, no adenopathy;    thyroid:  no enlargement/tenderness/nodules  Back:     Symmetric, no curvature, ROM normal, no CVA tenderness  Lungs:     respirations unlabored  Chest Wall:    No tenderness or deformity   Heart:    Regular rate and rhythm  Breast Exam:    No tenderness, masses, or nipple abnormality  Abdomen:     Soft, non-tender, bowel sounds active all four quadrants,    no masses, no organomegaly  Genitalia:    Normal female without lesion, discharge or tenderness   Vaginal cuff well healed. No lesions noted on vulva.     Extremities:    Extremities normal, atraumatic, no cyanosis or edema  Pulses:   2+ and symmetric all extremities  Skin:   Skin color, texture, turgor normal, no rashes or lesions    Assessment:    Healthy female exam. Normal exam    Plan:   DEXA and mammogam up to date F/u 1 year annual GYN or sooner prn   Christine [name] L. Christine [name], M.D., Christine [name]

## 2019-03-28 NOTE — Progress Notes (Signed)
Patient states she had a partial hysterectomy approx 11 yrs ago. Kathrene Alu RN

## 2019-03-29 ENCOUNTER — Telehealth: Payer: Self-pay | Admitting: *Deleted

## 2019-03-29 ENCOUNTER — Other Ambulatory Visit: Payer: Self-pay

## 2019-03-29 DIAGNOSIS — Z03818 Encounter for observation for suspected exposure to other biological agents ruled out: Secondary | ICD-10-CM | POA: Diagnosis not present

## 2019-03-29 NOTE — Telephone Encounter (Signed)
Copied from [name] [id number]. Topic: General - Inquiry >> Mar 29, 2019 12:20 PM Christine [name] wrote: Reason for CRM: Patient has an appt on [id number] patient was recently exsposed to Covid and has not take a test as of yet . Patient also took a fall on Tuesday hurting her leg . She would like to change upcoming appt from a OV to Virtual/Telephone  Please advise

## 2019-03-30 ENCOUNTER — Encounter: Payer: Self-pay | Admitting: Family Medicine

## 2019-03-30 ENCOUNTER — Ambulatory Visit (INDEPENDENT_AMBULATORY_CARE_PROVIDER_SITE_OTHER): Payer: Medicare HMO | Admitting: Family Medicine

## 2019-03-30 DIAGNOSIS — I10 Essential (primary) hypertension: Secondary | ICD-10-CM

## 2019-03-30 DIAGNOSIS — N3946 Mixed incontinence: Secondary | ICD-10-CM

## 2019-03-30 DIAGNOSIS — F411 Generalized anxiety disorder: Secondary | ICD-10-CM | POA: Diagnosis not present

## 2019-03-30 DIAGNOSIS — S81802A Unspecified open wound, left lower leg, initial encounter: Secondary | ICD-10-CM

## 2019-03-30 DIAGNOSIS — R69 Illness, unspecified: Secondary | ICD-10-CM | POA: Diagnosis not present

## 2019-03-30 NOTE — Progress Notes (Signed)
Chief Complaint  Patient presents with  . 6 Month Follow up  . Wound Check    Subjective: Patient is a 68 y.o. female here for med ck. Due to COVID-19 pandemic, we are interacting via web portal for an electronic face-to-face visit. I verified patient's ID using 2 identifiers. Patient agreed to proceed with visit via this method. Patient is at home, I am at office. Patient and I are present for visit.   Incontinence Taking Ditropan XL daily. No AE's, reports compliance. Controlling s/s's well.   Anxiety Taking Cymbalta 60 mg/d. No AE's, reports complmiance. No SI or HI. Not following w counselor or psychologist.  [place] 3 d ago. Skinned LLE. Has been using Neosporin several times daily. No fevers, drainage clear liquid (has edema).   Hypertension Patient presents for hypertension follow up. She is compliant with medications- Maxzide 75-50 mg/d. Patient has these side effects of medication: none She is usually adhering to a healthy diet overall. Exercise: walking  ROS: Heart: Denies chest pain  Lungs: Denies SOB   Past Medical History:  Diagnosis Date  . Anemia   . Anxiety   . Arthritis    "knees" (04/26/2016)  . Colon polyps    benign per pt  . Coronary artery disease    mild per 2015 cath in [place] (OM1 30%, RCA 30%)  . GERD (gastroesophageal reflux disease)   . Gout   . History of hiatal hernia   . Hypertension   . Migraine    "none since early /2017" (05/06/2016)  . Obesity   . OSA on CPAP    uses CPAP  . Pre-diabetes     Objective: No conversational dyspnea Age appropriate judgment and insight Nml affect and mood  Assessment and Plan: Mixed incontinence urge and stress  GAD (generalized anxiety disorder)  Morbid obesity (HCC)  Hypertension, benign  Wound of left lower extremity, initial encounter  1- Cont Ditropan. 2- Cont Cymbalta. 3- Cont Maxzide. 4- TAO bid, keep clean and dry.  F/u in 6 mo for CPE or prn.  The patient voiced understanding  and agreement to the plan.  [place], DO 03/30/19  11:12 AM

## 2019-03-30 NOTE — Telephone Encounter (Signed)
This was addressed at Virtual Visit with PCP today.

## 2019-03-31 ENCOUNTER — Encounter: Payer: Self-pay | Admitting: Gastroenterology

## 2019-03-31 DIAGNOSIS — R109 Unspecified abdominal pain: Secondary | ICD-10-CM | POA: Insufficient documentation

## 2019-04-01 NOTE — Addendum Note (Signed)
Addended by: Alonza Bogus D on: 04/01/2019 02:24 PM   Modules accepted: Level of Service

## 2019-04-02 NOTE — Progress Notes (Signed)
____________________________________________________________  Attending physician addendum:  Thank you for sending this case to me. I have reviewed the entire note, and the outlined plan seems appropriate.  Neisha Hinger Danis, MD  ____________________________________________________________  

## 2019-04-05 ENCOUNTER — Ambulatory Visit: Payer: Medicare HMO | Admitting: Gastroenterology

## 2019-04-14 ENCOUNTER — Other Ambulatory Visit: Payer: Self-pay | Admitting: Family Medicine

## 2019-04-22 DIAGNOSIS — G4733 Obstructive sleep apnea (adult) (pediatric): Secondary | ICD-10-CM | POA: Diagnosis not present

## 2019-04-24 ENCOUNTER — Encounter: Payer: Self-pay | Admitting: Family Medicine

## 2019-04-24 MED ORDER — METOPROLOL TARTRATE 50 MG PO TABS: 50.0000 mg | ORAL_TABLET | Freq: Every day | ORAL | 1 refills | Status: DC

## 2019-04-28 ENCOUNTER — Encounter: Payer: Self-pay | Admitting: Family Medicine

## 2019-05-01 ENCOUNTER — Ambulatory Visit: Payer: Medicare HMO | Admitting: Gastroenterology

## 2019-05-02 ENCOUNTER — Other Ambulatory Visit: Payer: Self-pay | Admitting: Podiatry

## 2019-05-07 ENCOUNTER — Ambulatory Visit: Payer: Medicare HMO | Admitting: Internal Medicine

## 2019-05-08 ENCOUNTER — Ambulatory Visit: Payer: Medicare HMO | Admitting: Internal Medicine

## 2019-05-08 ENCOUNTER — Other Ambulatory Visit: Payer: Self-pay

## 2019-05-08 ENCOUNTER — Encounter: Payer: Self-pay | Admitting: Family Medicine

## 2019-05-08 ENCOUNTER — Ambulatory Visit (INDEPENDENT_AMBULATORY_CARE_PROVIDER_SITE_OTHER): Payer: Medicare HMO | Admitting: Family Medicine

## 2019-05-08 DIAGNOSIS — M25369 Other instability, unspecified knee: Secondary | ICD-10-CM | POA: Diagnosis not present

## 2019-05-08 NOTE — Progress Notes (Signed)
Chief Complaint  Patient presents with  . Sore Throat    is better now  . Knee Injury    right    Subjective: Patient is a 69 y.o. female here for f/u from a fall. Due to COVID-19 pandemic, we are interacting via web portal for an electronic face-to-face visit. I verified patient's ID using 2 identifiers. Patient agreed to proceed with visit via this method. Patient is at home, I am at office. Patient and I are present for visit.   Patient took a fall in early December.  She scraped her left lower extremity and healed up nicely.  She reports a "dent" in her knee.  There is no pain, catching, locking, swelling, bruising, or difficulty walking.  She says it feels "off".  ROS: MSK: No pain  Past Medical History:  Diagnosis Date  . Anemia   . Anxiety   . Arthritis    "knees" (04/26/2016)  . Colon polyps    benign per pt  . Coronary artery disease    mild per 2015 cath in [place] (OM1 30%, RCA 30%)  . GERD (gastroesophageal reflux disease)   . Gout   . History of hiatal hernia   . Hypertension   . Migraine    "none since early /2017" (05/06/2016)  . Obesity   . OSA on CPAP    uses CPAP  . Pre-diabetes     Objective: No conversational dyspnea Age appropriate judgment and insight Nml affect and mood I had difficulty seeing her knee through the video chat.  Assessment and Plan: Instability of knee joint, unspecified laterality - Plan: Ambulatory referral to Sports Medicine  Offered watchful waiting versus referral to the sports med team for possible ultrasound.  She opted for the latter. Follow-up as originally scheduled. The patient voiced understanding and agreement to the plan.  [place], DO 05/08/19  11:31 AM

## 2019-05-10 ENCOUNTER — Other Ambulatory Visit: Payer: Self-pay | Admitting: Family Medicine

## 2019-05-15 ENCOUNTER — Other Ambulatory Visit: Payer: Self-pay | Admitting: Family Medicine

## 2019-05-15 ENCOUNTER — Encounter: Payer: Self-pay | Admitting: Family Medicine

## 2019-05-15 DIAGNOSIS — R609 Edema, unspecified: Secondary | ICD-10-CM

## 2019-05-15 NOTE — Progress Notes (Signed)
am

## 2019-05-16 ENCOUNTER — Other Ambulatory Visit: Payer: Self-pay

## 2019-05-16 ENCOUNTER — Ambulatory Visit: Payer: Medicare HMO | Admitting: Podiatry

## 2019-05-16 DIAGNOSIS — L989 Disorder of the skin and subcutaneous tissue, unspecified: Secondary | ICD-10-CM

## 2019-05-16 DIAGNOSIS — M79676 Pain in unspecified toe(s): Secondary | ICD-10-CM

## 2019-05-16 DIAGNOSIS — B351 Tinea unguium: Secondary | ICD-10-CM

## 2019-05-16 DIAGNOSIS — R69 Illness, unspecified: Secondary | ICD-10-CM | POA: Diagnosis not present

## 2019-05-17 ENCOUNTER — Other Ambulatory Visit: Payer: Self-pay

## 2019-05-17 ENCOUNTER — Ambulatory Visit: Payer: Medicare HMO | Admitting: Family Medicine

## 2019-05-17 ENCOUNTER — Ambulatory Visit (HOSPITAL_BASED_OUTPATIENT_CLINIC_OR_DEPARTMENT_OTHER)
Admission: RE | Admit: 2019-05-17 | Discharge: 2019-05-17 | Disposition: A | Discharge status: Home / Self Care | Payer: Medicare HMO | Source: Ambulatory Visit | Attending: Family Medicine | Admitting: Family Medicine

## 2019-05-17 ENCOUNTER — Telehealth: Payer: Self-pay | Admitting: Family Medicine

## 2019-05-17 ENCOUNTER — Ambulatory Visit: Payer: Self-pay

## 2019-05-17 ENCOUNTER — Encounter: Payer: Self-pay | Admitting: Family Medicine

## 2019-05-17 VITALS — BP 154/87 | HR 91 | Ht 62.0 in | Wt 292.0 lb

## 2019-05-17 DIAGNOSIS — M25561 Pain in right knee: Secondary | ICD-10-CM | POA: Diagnosis not present

## 2019-05-17 DIAGNOSIS — M179 Osteoarthritis of knee, unspecified: Secondary | ICD-10-CM | POA: Insufficient documentation

## 2019-05-17 DIAGNOSIS — M1710 Unilateral primary osteoarthritis, unspecified knee: Secondary | ICD-10-CM | POA: Insufficient documentation

## 2019-05-17 NOTE — Assessment & Plan Note (Signed)
No effusion noted of the knee.  There appears to be fat necrosis from the trauma.  There is also a calcium foreign body observed in the subcutaneous tissue.  No hematoma observed. -X-ray. -Counseled on home exercise therapy and supportive care. -Could consider physical therapy if needed.

## 2019-05-17 NOTE — Patient Instructions (Signed)
Nice to meet you Please try to keep using the exercise bike  I will call with the results   Please send me a message in [place] with any questions or updates.  Please see me back in 4-6 weeks.   --Dr. Raeford Razor

## 2019-05-17 NOTE — Progress Notes (Signed)
Zoie Simington - 69 y.o. female MRN [id number]  Date of birth: May 15, 1950  SUBJECTIVE:  Including CC & ROS.  Chief Complaint  Patient presents with  . Knee Injury    right knee x 1 month    Leydi Kocian is a 69 y.o. female that is presenting with right knee pain.  She had a fall last month and has ongoing changes of the knee.  She has an indention over the patellar tendon that was not there previous to the fall.  She landed on a piece of hard concrete when she fell.  She also feels like the knee may give from time to time.  She does not have any significant pain.  No history of similar symptoms or knee problems.   Review of Systems See HPI   HISTORY: Past Medical, Surgical, Social, and Family History Reviewed & Updated per EMR.   Pertinent Historical Findings include:  Past Medical History:  Diagnosis Date  . Anemia   . Anxiety   . Arthritis    "knees" (04/26/2016)  . Colon polyps    benign per pt  . Coronary artery disease    mild per 2015 cath in [place] (OM1 30%, RCA 30%)  . GERD (gastroesophageal reflux disease)   . Gout   . History of hiatal hernia   . Hypertension   . Migraine    "none since early /2017" (05/06/2016)  . Obesity   . OSA on CPAP    uses CPAP  . Pre-diabetes     Past Surgical History:  Procedure Laterality Date  . ABDOMINAL HYSTERECTOMY     "partial"; both ovaries present  . CARDIAC CATHETERIZATION  ~ 2015  . CARDIAC CATHETERIZATION  2015   In [place]  . CHILECTOMY Right 06/01/2017   Procedure: CHILECTOMY RIGHT FOOT;  Surgeon: Edrick Kins, DPM;  Location: [facility];  Service: Podiatry;  Laterality: Right;  . LAPAROSCOPIC APPENDECTOMY N/A 05/06/2016   Procedure: LAPAROSCOPIC APPENDECTOMY;  Surgeon: Donnie Mesa, MD;  Location: [place];  Service: General;  Laterality: N/A;  . TONSILLECTOMY      Allergies  Allergen Reactions  . Prednisone Swelling    Legs swell   . Tape   . Sulfa Antibiotics Rash    Family History  Problem Relation Age of  Onset  . Diabetes Mother   . Heart disease Mother   . High blood pressure Mother   . Asthma Mother   . Diabetes Father   . Heart disease Father   . High blood pressure Father   . Asthma Father   . Diabetes Sister   . Colon cancer Neg Hx   . Esophageal cancer Neg Hx   . Rectal cancer Neg Hx      Social History   Socioeconomic History  . Marital status: Single    Spouse name: Not on file  . Number of children: 0  . Years of education: Not on file  . Highest education level: Not on file  Occupational History  . Occupation: retired  Tobacco Use  . Smoking status: Former Smoker    Packs/day: 0.12    Years: 5.00    Pack years: 0.60    Types: Cigarettes    Quit date: 1980    Years since quitting: 41.0  . Smokeless tobacco: Never Used  Substance and Sexual Activity  . Alcohol use: No  . Drug use: No  . Sexual activity: Never  Other Topics Concern  . Not on file  Social History Narrative  .  Not on file   Social Determinants of Health   Financial Resource Strain:   . Difficulty of Paying Living Expenses: Not on file  Food Insecurity:   . Worried About Charity fundraiser in the Last Year: Not on file  . Ran Out of Food in the Last Year: Not on file  Transportation Needs:   . Lack of Transportation (Medical): Not on file  . Lack of Transportation (Non-Medical): Not on file  Physical Activity:   . Days of Exercise per Week: Not on file  . Minutes of Exercise per Session: Not on file  Stress:   . Feeling of Stress : Not on file  Social Connections:   . Frequency of Communication with Friends and Family: Not on file  . Frequency of Social Gatherings with Friends and Family: Not on file  . Attends Religious Services: Not on file  . Active Member of Clubs or Organizations: Not on file  . Attends Archivist Meetings: Not on file  . Marital Status: Not on file  Intimate Partner Violence:   . Fear of Current or Ex-Partner: Not on file  . Emotionally Abused:  Not on file  . Physically Abused: Not on file  . Sexually Abused: Not on file     PHYSICAL EXAM:  VS: BP (!) 154/87   Pulse 91   Ht 5\' 2"  (1.575 m)   Wt 292 lb (132.5 kg)   BMI 53.41 kg/m  Physical Exam Gen: NAD, alert, cooperative with exam, well-appearing ENT: normal lips, normal nasal mucosa,  Eye: normal EOM, normal conjunctiva and lids Skin: no rashes, no areas of induration  Neuro: normal tone, normal sensation to touch Psych:  normal insight, alert and oriented MSK:  Right knee: Normal range of motion. Some tenderness to palpation over the indention just above the patellar tendon. Normal strength resistance. No instability with valgus or varus stress testing. Negative McMurray's test. Neurovascularly intact  Limited ultrasound: Right knee:  No obvious effusion. Normal-appearing quadricep tendon. There appears to be a fat necrosis where the indention is occurring in the subcutaneous tissue.  There is hypoechoic change in this area to represent that.  This is superficial to the patellar tendon.  The patellar tendon appears to be normal. No other hematoma present. There is a calcific structure seen in the subcutaneous tissue of the medial proximal lower leg. Some mild to moderate medial joint space narrowing  Summary: Findings suggestive of fat necrosis from trauma and calcification within the subcutaneous tissue  Ultrasound and interpretation by Clearance Coots, MD    ASSESSMENT & PLAN:   Acute pain of right knee No effusion noted of the knee.  There appears to be fat necrosis from the trauma.  There is also a calcium foreign body observed in the subcutaneous tissue.  No hematoma observed. -X-ray. -Counseled on home exercise therapy and supportive care. -Could consider physical therapy if needed.

## 2019-05-17 NOTE — Telephone Encounter (Signed)
Informed of results.   Rosemarie Ax, MD [facility] 05/17/2019, 4:55 PM

## 2019-05-18 NOTE — Progress Notes (Signed)
    Subjective: Patient is a 69 y.o. female presenting to the office today for follow up evaluation of painful callus lesions noted to the bilateral feet. Walking and bearing weight increases the pain. She has not had any recent treatment. Patient also complains of elongated, thickened nails that cause pain while ambulating in shoes. She is unable to trim her own nails. Patient presents today for further treatment and evaluation.  Past Medical History:  Diagnosis Date  . Anemia   . Anxiety   . Arthritis    "knees" (04/26/2016)  . Colon polyps    benign per pt  . Coronary artery disease    mild per 2015 cath in [place] (OM1 30%, RCA 30%)  . GERD (gastroesophageal reflux disease)   . Gout   . History of hiatal hernia   . Hypertension   . Migraine    "none since early /2017" (05/06/2016)  . Obesity   . OSA on CPAP    uses CPAP  . Pre-diabetes     Objective:  Physical Exam General: Alert and oriented x3 in no acute distress  Dermatology: Hyperkeratotic lesions present on the bilateral feet x 4. Pain on palpation with a central nucleated core noted. Skin is warm, dry and supple bilateral lower extremities. Negative for open lesions or macerations. Nails are tender, long, thickened and dystrophic with subungual debris, consistent with onychomycosis, 1-5 bilateral. No signs of infection noted.  Vascular: Palpable pedal pulses bilaterally. No edema or erythema noted. Capillary refill within normal limits.  Neurological: Epicritic and protective threshold grossly intact bilaterally.   Musculoskeletal Exam: Pain on palpation at the keratotic lesions noted. Range of motion within normal limits bilateral. Muscle strength 5/5 in all groups bilateral.  Assessment: 1. Onychodystrophic nails 1-5 bilateral with hyperkeratosis of nails.  2. Onychomycosis of nail due to dermatophyte bilateral 3. Pre-ulcerative callus lesions noted to the bilateral feet x 4   Plan of Care:  1. Patient  evaluated. 2. Excisional debridement of keratoic lesion using a chisel blade was performed without incident.  3. Dressed with light dressing. 4. Mechanical debridement of nails 1-5 bilaterally performed using a nail nipper. Filed with dremel without incident.  5. Patient is to return to the clinic in 3 months.   Jancarlos Thrun M. Etheleen Valtierra, DPM [facility]  Dr. Dustine Bertini M. Adda Stokes, DPM    [address]                                        [place], [place] [address]                Office [phone]  Fax [phone]   

## 2019-05-19 ENCOUNTER — Other Ambulatory Visit: Payer: Self-pay | Admitting: Family Medicine

## 2019-05-19 DIAGNOSIS — N3946 Mixed incontinence: Secondary | ICD-10-CM

## 2019-05-20 ENCOUNTER — Ambulatory Visit: Payer: Medicare HMO | Attending: Internal Medicine

## 2019-05-20 DIAGNOSIS — Z23 Encounter for immunization: Secondary | ICD-10-CM | POA: Insufficient documentation

## 2019-05-20 NOTE — Progress Notes (Signed)
   Covid-19 Vaccination Clinic  Name:  Christine [name]    MRN: [id number] DOB: 1950-11-05  05/20/2019  Christine [name] was observed post Covid-19 immunization for 15 minutes without incidence. She was provided with Vaccine Information Sheet and instruction to access the V-Safe system.   Christine [name] was instructed to call 911 with any severe reactions post vaccine: Marland Kitchen Difficulty breathing  . Swelling of your face and throat  . A fast heartbeat  . A bad rash all over your body  . Dizziness and weakness    Immunizations Administered    Name Date Dose VIS Date Route   Pfizer COVID-19 Vaccine 05/20/2019 10:43 AM 0.3 mL 04/06/2019 Intramuscular   Manufacturer: [place]   Lot: [id number]   [place]: [id number]

## 2019-05-21 DIAGNOSIS — G4733 Obstructive sleep apnea (adult) (pediatric): Secondary | ICD-10-CM | POA: Diagnosis not present

## 2019-06-11 ENCOUNTER — Ambulatory Visit: Payer: Medicare HMO | Attending: Internal Medicine

## 2019-06-11 DIAGNOSIS — Z23 Encounter for immunization: Secondary | ICD-10-CM | POA: Insufficient documentation

## 2019-06-11 NOTE — Progress Notes (Signed)
   Covid-19 Vaccination Clinic  Name:  Noretta Holz    MRN: [id number] DOB: [birth year]  06/11/2019  Ms. Depasquale was observed post Covid-19 immunization for 15 minutes without incidence. She was provided with Vaccine Information Sheet and instruction to access the V-Safe system.   Ms. Swinton was instructed to call 911 with any severe reactions post vaccine: Marland Kitchen Difficulty breathing  . Swelling of your face and throat  . A fast heartbeat  . A bad rash all over your body  . Dizziness and weakness    Immunizations Administered    Name Date Dose VIS Date Route   Pfizer COVID-19 Vaccine 06/11/2019  8:48 AM 0.3 mL 04/06/2019 Intramuscular   Manufacturer: [place]   Lot: [id number]   [place]: [id number]

## 2019-06-26 ENCOUNTER — Telehealth (HOSPITAL_COMMUNITY): Payer: Self-pay

## 2019-06-26 ENCOUNTER — Other Ambulatory Visit: Payer: Self-pay | Admitting: *Deleted

## 2019-06-26 DIAGNOSIS — R600 Localized edema: Secondary | ICD-10-CM

## 2019-06-26 NOTE — Telephone Encounter (Signed)
The above patient or their representative was contacted and gave the following answers to these questions:         Do you have any of the following symptoms?    NO  Fever                    Cough                   Shortness of breath  Do  you have any of the following other symptoms?    muscle pain         vomiting,        diarrhea        rash         weakness        red eye        abdominal pain         bruising          bruising or bleeding              joint pain           severe headache    Have you been in contact with someone who was or has been sick in the past 2 weeks?  NO  Yes                 Unsure                         Unable to assess   Does the person that you were in contact with have any of the following symptoms?   Cough         shortness of breath           muscle pain         vomiting,            diarrhea            rash            weakness           fever            red eye           abdominal pain           bruising  or  bleeding                joint pain                severe headache                 COMMENTS OR ACTION PLAN FOR THIS PATIENT:         ALL QUESTIONS WERE ANSWERED/[facility] PATIENT HAS HAD BOTH COVID SHOTS/[facility]

## 2019-06-27 ENCOUNTER — Ambulatory Visit: Payer: Medicare HMO | Admitting: Family Medicine

## 2019-06-27 ENCOUNTER — Encounter: Payer: Self-pay | Admitting: Family Medicine

## 2019-06-27 ENCOUNTER — Other Ambulatory Visit: Payer: Self-pay

## 2019-06-27 DIAGNOSIS — M25561 Pain in right knee: Secondary | ICD-10-CM

## 2019-06-27 NOTE — Assessment & Plan Note (Signed)
Denies any pain today.  X-ray was revealing for a calcification in the proximal tibia but this area appears to be chronic no pain over the area. -Counseled on home exercise therapy and supportive care. -Can follow-up as needed.

## 2019-06-27 NOTE — Patient Instructions (Signed)
Good to see you Please use ice if needed Please continue the exercising.  Good luck writing the books   Please send me a message in MyChart with any questions or updates.  Please see [place] back as needed.   --Dr. Raeford Razor

## 2019-06-27 NOTE — Progress Notes (Signed)
Ashlee Nishio - 69 y.o. female MRN [id number]  Date of birth: 19-Sep-1950  SUBJECTIVE:  Including CC & ROS.  Chief Complaint  Patient presents with  . Follow-up    follow up for right knee    Yoshigei Friederichs is a 69 y.o. female that is following up for her right knee pain.  She is doing well with no significant pain today.  Has been able to exercise get back to her normal activities.  Independent review of the right knee x-ray from 1/21 shows moderate medial joint space narrowing.  It does show a calcification medial to the proximal tibia.   Review of Systems See HPI   HISTORY: Past Medical, Surgical, Social, and Family History Reviewed & Updated per EMR.   Pertinent Historical Findings include:  Past Medical History:  Diagnosis Date  . Anemia   . Anxiety   . Arthritis    "knees" (04/26/2016)  . Colon polyps    benign per pt  . Coronary artery disease    mild per 2015 cath in [place] (OM1 30%, RCA 30%)  . GERD (gastroesophageal reflux disease)   . Gout   . History of hiatal hernia   . Hypertension   . Migraine    "none since early /2017" (05/06/2016)  . Obesity   . OSA on CPAP    uses CPAP  . Pre-diabetes     Past Surgical History:  Procedure Laterality Date  . ABDOMINAL HYSTERECTOMY     "partial"; both ovaries present  . CARDIAC CATHETERIZATION  ~ 2015  . CARDIAC CATHETERIZATION  2015   In [place]  . CHILECTOMY Right 06/01/2017   Procedure: CHILECTOMY RIGHT FOOT;  Surgeon: Edrick Kins, DPM;  Location: [facility];  Service: Podiatry;  Laterality: Right;  . LAPAROSCOPIC APPENDECTOMY N/A 05/06/2016   Procedure: LAPAROSCOPIC APPENDECTOMY;  Surgeon: Donnie Mesa, MD;  Location[facility];  Service: General;  Laterality: N/A;  . TONSILLECTOMY      Family History  Problem Relation Age of Onset  . Diabetes Mother   . Heart disease Mother   . High blood pressure Mother   . Asthma Mother   . Diabetes Father   . Heart disease Father   . High blood pressure Father   .  Asthma Father   . Diabetes Sister   . Colon cancer Neg Hx   . Esophageal cancer Neg Hx   . Rectal cancer Neg Hx     Social History   Socioeconomic History  . Marital status: Single    Spouse name: Not on file  . Number of children: 0  . Years of education: Not on file  . Highest education level: Not on file  Occupational History  . Occupation: retired  Tobacco Use  . Smoking status: Former Smoker    Packs/day: 0.12    Years: 5.00    Pack years: 0.60    Types: Cigarettes    Quit date: 1980    Years since quitting: 41.1  . Smokeless tobacco: Never Used  Substance and Sexual Activity  . Alcohol use: No  . Drug use: No  . Sexual activity: Never  Other Topics Concern  . Not on file  Social History Narrative  . Not on file   Social Determinants of Health   Financial Resource Strain:   . Difficulty of Paying Living Expenses: Not on file  Food Insecurity:   . Worried About Charity fundraiser in the Last Year: Not on file  . Ran Out  of Food in the Last Year: Not on file  Transportation Needs:   . Lack of Transportation (Medical): Not on file  . Lack of Transportation (Non-Medical): Not on file  Physical Activity:   . Days of Exercise per Week: Not on file  . Minutes of Exercise per Session: Not on file  Stress:   . Feeling of Stress : Not on file  Social Connections:   . Frequency of Communication with Friends and Family: Not on file  . Frequency of Social Gatherings with Friends and Family: Not on file  . Attends Religious Services: Not on file  . Active Member of Clubs or Organizations: Not on file  . Attends Archivist Meetings: Not on file  . Marital Status: Not on file  Intimate Partner Violence:   . Fear of Current or Ex-Partner: Not on file  . Emotionally Abused: Not on file  . Physically Abused: Not on file  . Sexually Abused: Not on file     PHYSICAL EXAM:  VS: BP (!) 151/80   Pulse 85   Ht 5\' 2"  (1.575 m)   Wt 295 lb (133.8 kg)   BMI  53.96 kg/m  Physical Exam Gen: NAD, alert, cooperative with exam, well-appearing MSK:  Right knee: Obvious effusion. Normal range of motion. Normal strength resistance. Neurovascularly intact     ASSESSMENT & PLAN:   Acute pain of right knee Denies any pain today.  X-ray was revealing for a calcification in the proximal tibia but this area appears to be chronic no pain over the area. -Counseled on home exercise therapy and supportive care. -Can follow-up as needed.

## 2019-06-28 ENCOUNTER — Encounter: Payer: Self-pay | Admitting: Vascular Surgery

## 2019-06-28 ENCOUNTER — Other Ambulatory Visit: Payer: Self-pay

## 2019-06-28 ENCOUNTER — Ambulatory Visit (INDEPENDENT_AMBULATORY_CARE_PROVIDER_SITE_OTHER): Payer: Medicare HMO | Admitting: Vascular Surgery

## 2019-06-28 ENCOUNTER — Ambulatory Visit (HOSPITAL_COMMUNITY)
Admission: RE | Admit: 2019-06-28 | Discharge: 2019-06-28 | Disposition: A | Discharge status: Home / Self Care | Payer: Medicare HMO | Source: Ambulatory Visit | Attending: Surgery | Admitting: Surgery

## 2019-06-28 VITALS — BP 147/83 | HR 82 | Temp 97.7°F | Resp 20 | Ht 62.0 in | Wt 299.8 lb

## 2019-06-28 DIAGNOSIS — I83813 Varicose veins of bilateral lower extremities with pain: Secondary | ICD-10-CM

## 2019-06-28 DIAGNOSIS — R600 Localized edema: Secondary | ICD-10-CM

## 2019-06-28 NOTE — Progress Notes (Addendum)
Referring Physician: Dr. Nani Ravens  Patient name: Christine [name] MRN: [id number] DOB: [birth year] Sex: female  REASON FOR CONSULT: Bilateral leg swelling  HPI: Christine [name] is a 69 y.o. female, who has had chronic leg swelling for several years.  She had some type of vein procedure done several years ago but does not know exactly what she had done.  She currently does not have any skin breakdown.  She is obese.  She is continue to try to lose weight with portion control and a exercise program.  She has gone from 360 pounds down to 299 pounds over the past year or so.  She currently does wear compression stockings to the knee level and these do help with her swelling.  He does not know of any prior history of DVT.  She has not had any other leg operations.  Other medical problems include coronary artery disease, hypertension, migraines, sleep apnea all of which are currently stable.  Past Medical History:  Diagnosis Date  . Anemia   . Anxiety   . Arthritis    "knees" (04/26/2016)  . Colon polyps    benign per pt  . Coronary artery disease    mild per 2015 cath in [place] (OM1 30%, RCA 30%)  . GERD (gastroesophageal reflux disease)   . Gout   . History of hiatal hernia   . Hypertension   . Migraine    "none since early /2017" (05/06/2016)  . Obesity   . OSA on CPAP    uses CPAP  . Pre-diabetes    Past Surgical History:  Procedure Laterality Date  . ABDOMINAL HYSTERECTOMY     "partial"; both ovaries present  . CARDIAC CATHETERIZATION  ~ 2015  . CARDIAC CATHETERIZATION  2015   In [place]  . CHILECTOMY Right 06/01/2017   Procedure: CHILECTOMY RIGHT FOOT;  Surgeon: Edrick Kins, DPM;  Location: [facility];  Service: Podiatry;  Laterality: Right;  . LAPAROSCOPIC APPENDECTOMY N/A 05/06/2016   Procedure: LAPAROSCOPIC APPENDECTOMY;  Surgeon: Donnie Mesa, MD;  Location[facility];  Service: General;  Laterality: N/A;  . TONSILLECTOMY      Family History  Problem Relation Age of Onset    . Diabetes Mother   . Heart disease Mother   . High blood pressure Mother   . Asthma Mother   . Diabetes Father   . Heart disease Father   . High blood pressure Father   . Asthma Father   . Diabetes Sister   . Colon cancer Neg Hx   . Esophageal cancer Neg Hx   . Rectal cancer Neg Hx     SOCIAL HISTORY: Social History   Socioeconomic History  . Marital status: Single    Spouse name: Not on file  . Number of children: 0  . Years of education: Not on file  . Highest education level: Not on file  Occupational History  . Occupation: retired  Tobacco Use  . Smoking status: Former Smoker    Packs/day: 0.12    Years: 5.00    Pack years: 0.60    Types: Cigarettes    Quit date: 1980    Years since quitting: 41.2  . Smokeless tobacco: Never Used  Substance and Sexual Activity  . Alcohol use: No  . Drug use: No  . Sexual activity: Never  Other Topics Concern  . Not on file  Social History Narrative  . Not on file   Social Determinants of Health   Financial Resource Strain:   .  Difficulty of Paying Living Expenses: Not on file  Food Insecurity:   . Worried About Charity fundraiser in the Last Year: Not on file  . Ran Out of Food in the Last Year: Not on file  Transportation Needs:   . Lack of Transportation (Medical): Not on file  . Lack of Transportation (Non-Medical): Not on file  Physical Activity:   . Days of Exercise per Week: Not on file  . Minutes of Exercise per Session: Not on file  Stress:   . Feeling of Stress : Not on file  Social Connections:   . Frequency of Communication with Friends and Family: Not on file  . Frequency of Social Gatherings with Friends and Family: Not on file  . Attends Religious Services: Not on file  . Active Member of Clubs or Organizations: Not on file  . Attends Archivist Meetings: Not on file  . Marital Status: Not on file  Intimate Partner Violence:   . Fear of Current or Ex-Partner: Not on file  . Emotionally  Abused: Not on file  . Physically Abused: Not on file  . Sexually Abused: Not on file    Allergies  Allergen Reactions  . Prednisone Swelling    Legs swell   . Tape   . Sulfa Antibiotics Rash    Current Outpatient Medications  Medication Sig Dispense Refill  . acetaminophen (TYLENOL) 325 MG tablet Take 2 tablets (650 mg total) by mouth every 6 (six) hours as needed for mild pain (or temp > 100).    Marland Kitchen aspirin-acetaminophen-caffeine (EXCEDRIN MIGRAINE) 250-250-65 MG tablet Take 2 tablets by mouth every 8 (eight) hours as needed for migraine.    . Diphenhydramine-Acetaminophen (PERCOGESIC PO) Take 1 tablet by mouth daily as needed (pain).     . DULoxetine (CYMBALTA) 30 MG capsule Take 1 capsule (30 mg total) by mouth daily. 90 capsule 0  . esomeprazole (NEXIUM) 40 MG capsule Take 1 capsule (40 mg total) by mouth daily. 30 capsule 3  . fexofenadine (ALLEGRA) 180 MG tablet Take 180 mg by mouth daily.    Marland Kitchen ibuprofen (ADVIL) 800 MG tablet TAKE 1 TABLET BY MOUTH EVERY 8 HOURS AS NEEDED 90 tablet 0  . iron polysaccharides (NIFEREX) 150 MG capsule Take 150 mg by mouth daily.    . metoprolol tartrate (LOPRESSOR) 50 MG tablet Take 1 tablet (50 mg total) by mouth daily. 90 tablet 1  . oxybutynin (DITROPAN XL) 15 MG 24 hr tablet TAKE ONE TABLET BY MOUTH EVERY NIGHT AT BEDTIME 30 tablet 4  . triamterene-hydrochlorothiazide (MAXZIDE) 75-50 MG tablet TAKE 1 TABLET BY MOUTH EVERY DAY IN THE EVENING 30 tablet 5   No current facility-administered medications for this visit.    ROS:   General:  + weight loss, no fever, chills  HEENT: No recent headaches, no nasal bleeding, no visual changes, no sore throat  Neurologic: No dizziness, blackouts, seizures. No recent symptoms of stroke or mini- stroke. No recent episodes of slurred speech, or temporary blindness.  Cardiac: No recent episodes of chest pain/pressure, no shortness of breath at rest.  + shortness of breath with exertion.  Denies history of  atrial fibrillation or irregular heartbeat  Vascular: No history of rest pain in feet.  No history of claudication.  No history of non-healing ulcer, No history of DVT   Pulmonary: No home oxygen, no productive cough, no hemoptysis,  No asthma or wheezing  Musculoskeletal:  [ ]  Arthritis, [ ]  Low back pain,  [ ]   Joint pain  Hematologic:No history of hypercoagulable state.  No history of easy bleeding.  No history of anemia  Gastrointestinal: No hematochezia or melena,  No gastroesophageal reflux, no trouble swallowing  Urinary: [ ]  chronic Kidney disease, [ ]  on HD - [ ]  MWF or [ ]  TTHS, [ ]  Burning with urination, [ ]  Frequent urination, [ ]  Difficulty urinating;   Skin: No rashes  Psychological: No history of anxiety,  No history of depression   Physical Examination  Vitals:   06/28/19 1333  BP: (!) 147/83  Pulse: 82  Resp: 20  Temp: 97.7 F (36.5 C)  SpO2: 100%  Weight: 299 lb 12.8 oz (136 kg)  Height: 5\' 2"  (1.575 m)    Body mass index is 54.83 kg/m.  General:  Alert and oriented, no acute distress HEENT: Normal Neck: No JVD Cardiac: Regular Rate and Rhythm  Abdomen: Obese Skin: No rash, circumferential brawny staining gaiter area bilaterally no open ulcer  extremity Pulses:  2+  dorsalis pedis, posterior tibial pulses bilaterally Musculoskeletal: No deformity or edema, large pannus right and left medial thighs extending all the way down into the calf area Neurologic: Upper and lower extremity motor 5/5 and symmetric  DATA:  Patient had a venous duplex exam for reflux.  This showed reflux in the common femoral vein bilaterally.  She also had diffuse reflux in the greater saphenous vein including the saphenofemoral junction with a vein diameter of 4 to 5 mm.  ASSESSMENT: Patient with deep and superficial venous reflux.  However her leg is too large for consideration of laser ablation is most likely the needle would not be able to penetrate deep enough to access her  saphenous vein.  Additionally her primary problem is more related to weight issues.  I had a lengthy discussion with her today about continued weight loss as well as the possibility of weight loss reduction operation.  She would like to continue on her current pathway of diet changes and exercise.  Patient was reassured that she is not at risk of limb loss but could develop ulcerations in the skin if she does not keep the swelling controlled and have continued weight loss.  PLAN: Patient will continue wearing her lower extremity compression stockings and try to lose weight.  She will follow up on an as-needed basis.  She apparently wanted a prescription for some type of nighttime compression device.  She will bring this by our office and we will look over this for consideration.   Ruta Hinds, MD [facility] Office: [phone] Pager: [phone]  August 09, 2019.  Addendum: Patient sent me additional information last week.  She requested prescription for a Jobst relax custom-made compression garment for lymphedema management.  She states that her current compression strength is 20 to 30 mmHg.  She also provided information that she received a laser ablation x2 in the left leg and 1 in the right leg from a surgeon in [place] 10 years ago.   We will send her a prescription for the Jobst relax compression garment 20 to 30 mmHg or 30 to 40 mmHg if available.  Ruta Hinds, MD [facility] Office: [phone]

## 2019-07-12 DIAGNOSIS — R69 Illness, unspecified: Secondary | ICD-10-CM | POA: Diagnosis not present

## 2019-07-18 ENCOUNTER — Other Ambulatory Visit: Payer: Self-pay | Admitting: Family Medicine

## 2019-07-18 MED ORDER — DULOXETINE HCL 30 MG PO CPEP: 30.0000 mg | ORAL_CAPSULE | Freq: Every day | ORAL | 1 refills | Status: DC

## 2019-07-26 DIAGNOSIS — G4733 Obstructive sleep apnea (adult) (pediatric): Secondary | ICD-10-CM | POA: Diagnosis not present

## 2019-08-04 ENCOUNTER — Other Ambulatory Visit: Payer: Self-pay | Admitting: Family Medicine

## 2019-08-15 ENCOUNTER — Other Ambulatory Visit: Payer: Self-pay

## 2019-08-15 ENCOUNTER — Ambulatory Visit: Payer: Medicare HMO | Admitting: Podiatry

## 2019-08-15 DIAGNOSIS — B351 Tinea unguium: Secondary | ICD-10-CM | POA: Diagnosis not present

## 2019-08-15 DIAGNOSIS — L989 Disorder of the skin and subcutaneous tissue, unspecified: Secondary | ICD-10-CM

## 2019-08-15 DIAGNOSIS — M79676 Pain in unspecified toe(s): Secondary | ICD-10-CM | POA: Diagnosis not present

## 2019-08-20 NOTE — Progress Notes (Signed)
    Subjective: Patient is a 68 y.o. female presenting to the office today for follow up evaluation of painful callus lesions noted to the bilateral feet. Walking and bearing weight increases the pain. She has not had any recent treatment. Patient also complains of elongated, thickened nails that cause pain while ambulating in shoes. She is unable to trim her own nails. Patient presents today for further treatment and evaluation.  Past Medical History:  Diagnosis Date  . Anemia   . Anxiety   . Arthritis    "knees" (04/26/2016)  . Colon polyps    benign per pt  . Coronary artery disease    mild per 2015 cath in [place] (OM1 30%, RCA 30%)  . GERD (gastroesophageal reflux disease)   . Gout   . History of hiatal hernia   . Hypertension   . Migraine    "none since early /2017" (05/06/2016)  . Obesity   . OSA on CPAP    uses CPAP  . Pre-diabetes     Objective:  Physical Exam General: Alert and oriented x3 in no acute distress  Dermatology: Hyperkeratotic lesions present on the bilateral feet x 4. Pain on palpation with a central nucleated core noted. Skin is warm, dry and supple bilateral lower extremities. Negative for open lesions or macerations. Nails are tender, long, thickened and dystrophic with subungual debris, consistent with onychomycosis, 1-5 bilateral. No signs of infection noted.  Vascular: Palpable pedal pulses bilaterally. No edema or erythema noted. Capillary refill within normal limits.  Neurological: Epicritic and protective threshold grossly intact bilaterally.   Musculoskeletal Exam: Pain on palpation at the keratotic lesions noted. Range of motion within normal limits bilateral. Muscle strength 5/5 in all groups bilateral.  Assessment: 1. Onychodystrophic nails 1-5 bilateral with hyperkeratosis of nails.  2. Onychomycosis of nail due to dermatophyte bilateral 3. Pre-ulcerative callus lesions noted to the bilateral feet x 4   Plan of Care:  1. Patient  evaluated. 2. Excisional debridement of keratoic lesion using a chisel blade was performed without incident.  3. Dressed with light dressing. 4. Mechanical debridement of nails 1-5 bilaterally performed using a nail nipper. Filed with dremel without incident.  5. Patient is to return to the clinic in 3 months.   Jordane Hisle M. Tiffony Kite, DPM [facility]  Dr. Challis Crill M. Cheyeanne Roadcap, DPM    [address]                                        [place], [place] [address]                Office [phone]  Fax [phone]   

## 2019-08-25 DIAGNOSIS — G4733 Obstructive sleep apnea (adult) (pediatric): Secondary | ICD-10-CM | POA: Diagnosis not present

## 2019-08-29 DIAGNOSIS — G4733 Obstructive sleep apnea (adult) (pediatric): Secondary | ICD-10-CM | POA: Diagnosis not present

## 2019-08-31 DIAGNOSIS — I809 Phlebitis and thrombophlebitis of unspecified site: Secondary | ICD-10-CM | POA: Insufficient documentation

## 2019-08-31 DIAGNOSIS — M109 Gout, unspecified: Secondary | ICD-10-CM | POA: Insufficient documentation

## 2019-08-31 DIAGNOSIS — R7303 Prediabetes: Secondary | ICD-10-CM | POA: Insufficient documentation

## 2019-09-04 DIAGNOSIS — R69 Illness, unspecified: Secondary | ICD-10-CM | POA: Diagnosis not present

## 2019-09-12 DIAGNOSIS — N951 Menopausal and female climacteric states: Secondary | ICD-10-CM | POA: Diagnosis not present

## 2019-09-12 DIAGNOSIS — R635 Abnormal weight gain: Secondary | ICD-10-CM | POA: Diagnosis not present

## 2019-09-12 DIAGNOSIS — I10 Essential (primary) hypertension: Secondary | ICD-10-CM | POA: Diagnosis not present

## 2019-09-12 DIAGNOSIS — E8881 Metabolic syndrome: Secondary | ICD-10-CM | POA: Diagnosis not present

## 2019-09-14 ENCOUNTER — Other Ambulatory Visit: Payer: Self-pay

## 2019-09-14 ENCOUNTER — Ambulatory Visit: Payer: Medicare HMO | Admitting: Pulmonary Disease

## 2019-09-14 ENCOUNTER — Encounter: Payer: Self-pay | Admitting: Pulmonary Disease

## 2019-09-14 VITALS — BP 122/72 | HR 74 | Temp 97.2°F | Ht 61.5 in | Wt 297.6 lb

## 2019-09-14 DIAGNOSIS — G4730 Sleep apnea, unspecified: Secondary | ICD-10-CM | POA: Diagnosis not present

## 2019-09-14 DIAGNOSIS — Z9989 Dependence on other enabling machines and devices: Secondary | ICD-10-CM | POA: Diagnosis not present

## 2019-09-14 DIAGNOSIS — G4733 Obstructive sleep apnea (adult) (pediatric): Secondary | ICD-10-CM

## 2019-09-14 DIAGNOSIS — E669 Obesity, unspecified: Secondary | ICD-10-CM | POA: Diagnosis not present

## 2019-09-14 NOTE — Progress Notes (Signed)
[facility]  Chief Complaint  Patient presents with  . Follow-up    OSA on CPAP    Constitutional:  BP 122/72 (BP Location: Left Arm, Patient Position: Sitting, Cuff Size: Normal)   Pulse 74   Temp (!) 97.2 F (36.2 C) (Temporal)   Ht 5' 1.5" (1.562 m)   Wt 297 lb 9.6 oz (135 kg)   SpO2 98%   BMI 55.32 kg/m   Past Medical History:  Migraine HA, HTN, HH, GERD, Gout, CAD, Colon polyps, OA, Anxiety, Anemia  Brief Summary:  Christine [name] is a 69 y.o. female with obstructive sleep apnea.  Subjective:  Doing well with CPAP.  No issues with mask fit.  Denies sinus congestion, sore throat, dry mouth, or aerophagia.  Using portion control to help with weight loss.  Marceline vaccines.  Physical Exam:   Appearance - well kempt   ENMT - no sinus tenderness, no oral exudate, no LAN, Mallampati 3 airway, no stridor  Respiratory - equal breath sounds bilaterally, no wheezing or rales  CV - s1s2 regular rate and rhythm, no murmurs  Ext - no clubbing, no edema  Skin - no rashes  Psych - normal mood and affect  Assessment/Plan:   Obstructive sleep apnea. - she is compliant with CPAP and reports benefit - continue auto CPAP  Obesity. - she is using portion control to assist with weight loss  A total of  21 minutes spent addressing patient care issues on day of visit.   Follow up:   Patient Instructions  Follow up in 1 year   Signature:  Chesley Mires, MD [facility] Pager: [phone] 09/14/2019, 9:46 AM  Flow Sheet    Sleep tests:   HST 08/31/17 >> AHI 17.8, SaO2 low 88%  Auto CPAP 08/14/19 to 09/12/19 >> used on 30 of 30 nights with average 8 hrs 4 min.  Average AHI 1.4 with median CPAP 10 and 95 th percentile CPAP 13 cm H2O.  Medications:   Allergies as of 09/14/2019      Reactions   Prednisone Swelling   Legs swell    Tape    Sulfa Antibiotics Rash      Medication List       Accurate as of Sep 14, 2019  9:46 AM. If you have any questions, ask your nurse or doctor.        acetaminophen 325 MG tablet Commonly known as: TYLENOL Take 2 tablets (650 mg total) by mouth every 6 (six) hours as needed for mild pain (or temp > 100).   Acetaminophen-Codeine 300-30 MG tablet Take 1 tablet by mouth every 8 (eight) hours as needed.   aspirin 81 MG EC tablet Take by mouth.   aspirin-acetaminophen-caffeine 250-250-65 MG tablet Commonly known as: EXCEDRIN MIGRAINE Take 2 tablets by mouth every 8 (eight) hours as needed for migraine.   DULoxetine 30 MG capsule Commonly known as: CYMBALTA Take 1 capsule (30 mg total) by mouth daily.   esomeprazole 40 MG capsule Commonly known as: NEXIUM TAKE ONE CAPSULE BY MOUTH DAILY   fexofenadine 180 MG tablet Commonly known as: ALLEGRA Take 180 mg by mouth daily.   ibuprofen 800 MG tablet Commonly known as: ADVIL TAKE 1 TABLET BY MOUTH EVERY 8 HOURS AS NEEDED   iron polysaccharides 150 MG capsule Commonly known as: NIFEREX Take 150 mg by mouth daily.   metoprolol tartrate 50 MG tablet Commonly known as: LOPRESSOR Take 1 tablet (50 mg  total) by mouth daily.   oxybutynin 15 MG 24 hr tablet Commonly known as: DITROPAN XL TAKE ONE TABLET BY MOUTH EVERY NIGHT AT BEDTIME   PERCOGESIC PO Take 1 tablet by mouth daily as needed (pain).   triamterene-hydrochlorothiazide 75-50 MG tablet Commonly known as: MAXZIDE TAKE 1 TABLET BY MOUTH EVERY DAY IN THE EVENING       Past Surgical History:  She  has a past surgical history that includes Tonsillectomy; Abdominal hysterectomy; Cardiac catheterization (~ 2015); laparoscopic appendectomy (N/A, 05/06/2016); Cardiac catheterization (2015); and Chilectomy (Right, 06/01/2017).  Family History:  Her family history includes Asthma in her father and mother; Diabetes in her father, mother, and sister; Heart disease in her father and mother; High blood pressure in her father and  mother.  Social History:  She  reports that she quit smoking about 41 years ago. Her smoking use included cigarettes. She has a 0.60 pack-year smoking history. She has never used smokeless tobacco. She reports that she does not drink alcohol or use drugs.

## 2019-09-14 NOTE — Patient Instructions (Signed)
Follow up in 1 year.

## 2019-09-21 DIAGNOSIS — N951 Menopausal and female climacteric states: Secondary | ICD-10-CM | POA: Diagnosis not present

## 2019-09-21 DIAGNOSIS — I10 Essential (primary) hypertension: Secondary | ICD-10-CM | POA: Diagnosis not present

## 2019-09-21 DIAGNOSIS — E8881 Metabolic syndrome: Secondary | ICD-10-CM | POA: Diagnosis not present

## 2019-09-21 DIAGNOSIS — D509 Iron deficiency anemia, unspecified: Secondary | ICD-10-CM | POA: Diagnosis not present

## 2019-09-21 DIAGNOSIS — Z6841 Body Mass Index (BMI) 40.0 and over, adult: Secondary | ICD-10-CM | POA: Diagnosis not present

## 2019-09-21 DIAGNOSIS — Z1339 Encounter for screening examination for other mental health and behavioral disorders: Secondary | ICD-10-CM | POA: Diagnosis not present

## 2019-09-21 DIAGNOSIS — Z1331 Encounter for screening for depression: Secondary | ICD-10-CM | POA: Diagnosis not present

## 2019-09-21 DIAGNOSIS — K219 Gastro-esophageal reflux disease without esophagitis: Secondary | ICD-10-CM | POA: Diagnosis not present

## 2019-09-25 DIAGNOSIS — G4733 Obstructive sleep apnea (adult) (pediatric): Secondary | ICD-10-CM | POA: Diagnosis not present

## 2019-09-26 DIAGNOSIS — R69 Illness, unspecified: Secondary | ICD-10-CM | POA: Diagnosis not present

## 2019-09-28 ENCOUNTER — Other Ambulatory Visit: Payer: Self-pay | Admitting: Family Medicine

## 2019-09-28 DIAGNOSIS — N3946 Mixed incontinence: Secondary | ICD-10-CM

## 2019-09-28 MED ORDER — OXYBUTYNIN CHLORIDE ER 15 MG PO TB24: 15.0000 mg | ORAL_TABLET | Freq: Every day | ORAL | 4 refills | Status: DC

## 2019-10-05 ENCOUNTER — Other Ambulatory Visit: Payer: Self-pay

## 2019-10-05 ENCOUNTER — Ambulatory Visit (INDEPENDENT_AMBULATORY_CARE_PROVIDER_SITE_OTHER): Payer: Medicare HMO | Admitting: Family Medicine

## 2019-10-05 ENCOUNTER — Encounter: Payer: Self-pay | Admitting: Family Medicine

## 2019-10-05 VITALS — BP 128/82 | HR 101 | Temp 95.7°F | Ht 61.5 in | Wt 306.5 lb

## 2019-10-05 DIAGNOSIS — R6889 Other general symptoms and signs: Secondary | ICD-10-CM | POA: Insufficient documentation

## 2019-10-05 DIAGNOSIS — D649 Anemia, unspecified: Secondary | ICD-10-CM | POA: Diagnosis not present

## 2019-10-05 DIAGNOSIS — H539 Unspecified visual disturbance: Secondary | ICD-10-CM

## 2019-10-05 DIAGNOSIS — Z0000 Encounter for general adult medical examination without abnormal findings: Secondary | ICD-10-CM

## 2019-10-05 DIAGNOSIS — R0989 Other specified symptoms and signs involving the circulatory and respiratory systems: Secondary | ICD-10-CM

## 2019-10-05 NOTE — Patient Instructions (Addendum)
Give [place] 2-3 business days to get the results of your labs back.   Keep the diet clean and stay active.  The new Shingrix vaccine (for shingles) is a 2 shot series. It can make people feel low energy, achy and almost like they have the flu for 48 hours after injection. Please plan accordingly when deciding on when to get this shot. Call your pharmacy to get this. The second shot of the series is less severe regarding the side effects, but it still lasts 48 hours.   If you do not hear anything about your referrals in the next 1-2 weeks, call our office and ask for an update.  Let us know if you need anything.

## 2019-10-05 NOTE — Progress Notes (Signed)
Chief Complaint  Patient presents with  . Follow-up     Well Woman Christine [name] is here for a complete physical.   Her last physical was >1 year ago.  Current diet: in general, a "pretty good" diet. Current exercise: cycling, stretches. Weight is fluctuating and she denies daytime fatigue. Seatbelt? Yes  Health Maintenance Colonoscopy- Yes Shingrix- No DEXA- Yes Mammogram- Yes Tetanus- Yes Pneumonia- Yes Hep C screen- Yes   9 years ago R eye was protruding compared to usual. Her eye would dilate spontaneously in the coming months. It spontaneously resolved until 2 weeks ago where something 3 feet away seemed like it was 20 feet away. No imaging. Went to see several specialists.   Has a chronic hx of clearing throat. Tried PPI's and OTC allergy tx's. Has never seen ENT. No pain or changes in voice.   Past Medical History:  Diagnosis Date  . Anemia   . Anxiety   . Arthritis    "knees" (04/26/2016)  . Colon polyps    benign per pt  . Coronary artery disease    mild per 2015 cath in [place] (OM1 30%, RCA 30%)  . GERD (gastroesophageal reflux disease)   . Gout   . History of hiatal hernia   . Hypertension   . Migraine    "none since early /2017" (05/06/2016)  . Obesity   . OSA on CPAP    uses CPAP  . Pre-diabetes      Past Surgical History:  Procedure Laterality Date  . ABDOMINAL HYSTERECTOMY     "partial"; both ovaries present  . CARDIAC CATHETERIZATION  ~ 2015  . CARDIAC CATHETERIZATION  2015   In [place]  . CHILECTOMY Right 06/01/2017   Procedure: CHILECTOMY RIGHT FOOT;  Surgeon: Edrick Kins, DPM;  Location: [facility];  Service: Podiatry;  Laterality: Right;  . LAPAROSCOPIC APPENDECTOMY N/A 05/06/2016   Procedure: LAPAROSCOPIC APPENDECTOMY;  Surgeon: Donnie Mesa, MD;  Location: [facility];  Service: General;  Laterality: N/A;  . TONSILLECTOMY      Medications  Current Outpatient Medications on File Prior to Visit  Medication Sig Dispense Refill  .  acetaminophen (TYLENOL) 325 MG tablet Take 2 tablets (650 mg total) by mouth every 6 (six) hours as needed for mild pain (or temp > 100).    . Acetaminophen-Codeine 300-30 MG tablet Take 1 tablet by mouth every 8 (eight) hours as needed.    Marland Kitchen aspirin 81 MG EC tablet Take by mouth.    Marland Kitchen aspirin-acetaminophen-caffeine (EXCEDRIN MIGRAINE) 250-250-65 MG tablet Take 2 tablets by mouth every 8 (eight) hours as needed for migraine.    . Diphenhydramine-Acetaminophen (PERCOGESIC PO) Take 1 tablet by mouth daily as needed (pain).     . DULoxetine (CYMBALTA) 30 MG capsule Take 1 capsule (30 mg total) by mouth daily. 90 capsule 1  . esomeprazole (NEXIUM) 40 MG capsule TAKE ONE CAPSULE BY MOUTH DAILY 30 capsule 2  . fexofenadine (ALLEGRA) 180 MG tablet Take 180 mg by mouth daily.    Marland Kitchen ibuprofen (ADVIL) 800 MG tablet TAKE 1 TABLET BY MOUTH EVERY 8 HOURS AS NEEDED 90 tablet 0  . iron polysaccharides (NIFEREX) 150 MG capsule Take 150 mg by mouth daily.    . metoprolol tartrate (LOPRESSOR) 50 MG tablet Take 1 tablet (50 mg total) by mouth daily. 90 tablet 1  . oxybutynin (DITROPAN XL) 15 MG 24 hr tablet Take 1 tablet (15 mg total) by mouth at bedtime. 30 tablet 4  . triamterene-hydrochlorothiazide (MAXZIDE)  75-50 MG tablet TAKE 1 TABLET BY MOUTH EVERY DAY IN THE EVENING 30 tablet 5   Allergies Allergies  Allergen Reactions  . Prednisone Swelling    Legs swell   . Tape   . Sulfa Antibiotics Rash    Review of Systems: Constitutional:  no fevers Eye:  no recent significant change in vision Ears:  No changes in hearing Nose/Mouth/Throat:  no complaints of nasal congestion, +throat clearing Cardiovascular: no chest pain Respiratory:  No shortness of breath Gastrointestinal:  No change in bowel habits GU:  Female: negative for dysuria Integumentary:  No new abnormal skin lesions reported Neurologic:  no headaches Endocrine:  + weight changes  Exam BP 128/82 (BP Location: Left Arm, Patient Position:  Sitting, Cuff Size: Large)   Pulse (!) 101   Temp (!) 95.7 F (35.4 C) (Temporal)   Ht 5' 1.5" (1.562 m)   Wt (!) 306 lb 8 oz (139 kg)   SpO2 98%   BMI 56.97 kg/m  General:  well developed, well nourished, in no apparent distress Skin:  no significant moles, warts, or growths Head:  no masses, lesions, or tenderness Eyes:  pupils equal and round, sclera anicteric without injection Ears:  canals without lesions, TMs shiny without retraction, no obvious effusion, no erythema Nose:  nares patent, septum midline, mucosa normal, and no drainage or sinus tenderness Throat/Pharynx:  lips and gingiva without lesion; tongue and uvula midline; non-inflamed pharynx; no exudates or postnasal drainage Neck: neck supple without adenopathy, thyromegaly, or masses Lungs:  clear to auscultation, breath sounds equal bilaterally, no respiratory distress Cardio:  regular rate and rhythm, no bruits Abdomen:  abdomen soft, nontender; bowel sounds normal; no masses or organomegaly Genital: Deferred Neuro:  No cerebellar signs Psych: well oriented with normal range of affect and appropriate judgment/insight  Assessment and Plan  Well adult exam - Plan: CBC, Comprehensive metabolic panel, Lipid panel  Anemia, unspecified type - Plan: IBC + Ferritin  Throat clearing - Plan: Ambulatory referral to ENT  Vision changes - Plan: Ambulatory referral to Ophthalmology   Well 69 y.o. female. Counseled on diet and exercise. Refer ophtho for her odd vision complaint of R eye. Refer ENT for possible scope. She has failed PPI and allergy treatment.  Other orders as above. Will come back for labs.  Follow up in 6 mo or prn. The patient voiced understanding and agreement to the plan.  [place], DO 10/05/19 9:23 AM

## 2019-10-10 ENCOUNTER — Other Ambulatory Visit: Payer: Self-pay

## 2019-10-10 ENCOUNTER — Other Ambulatory Visit (INDEPENDENT_AMBULATORY_CARE_PROVIDER_SITE_OTHER): Payer: Medicare HMO

## 2019-10-10 ENCOUNTER — Ambulatory Visit: Payer: Medicare HMO | Attending: Internal Medicine

## 2019-10-10 ENCOUNTER — Telehealth: Payer: Self-pay | Admitting: Family Medicine

## 2019-10-10 ENCOUNTER — Other Ambulatory Visit: Payer: Self-pay | Admitting: Family Medicine

## 2019-10-10 DIAGNOSIS — Z0000 Encounter for general adult medical examination without abnormal findings: Secondary | ICD-10-CM

## 2019-10-10 DIAGNOSIS — E785 Hyperlipidemia, unspecified: Secondary | ICD-10-CM

## 2019-10-10 DIAGNOSIS — Z20822 Contact with and (suspected) exposure to covid-19: Secondary | ICD-10-CM | POA: Diagnosis not present

## 2019-10-10 DIAGNOSIS — D72818 Other decreased white blood cell count: Secondary | ICD-10-CM

## 2019-10-10 DIAGNOSIS — D649 Anemia, unspecified: Secondary | ICD-10-CM | POA: Diagnosis not present

## 2019-10-10 LAB — CBC
HCT: 31.3 % — ABNORMAL LOW (ref 36.0–46.0)
Hemoglobin: 10.2 g/dL — ABNORMAL LOW (ref 12.0–15.0)
MCHC: 32.7 g/dL (ref 30.0–36.0)
MCV: 83.1 fl (ref 78.0–100.0)
Platelets: 273 10*3/uL (ref 150.0–400.0)
RBC: 3.76 Mil/uL — ABNORMAL LOW (ref 3.87–5.11)
RDW: 17.7 % — ABNORMAL HIGH (ref 11.5–15.5)
WBC: 5 10*3/uL (ref 4.0–10.5)

## 2019-10-10 LAB — LIPID PANEL
Cholesterol: 203 mg/dL — ABNORMAL HIGH (ref 0–200)
HDL: 58.3 mg/dL (ref >39.00)
LDL Cholesterol: 122 mg/dL — ABNORMAL HIGH (ref 0–99)
NonHDL: 145.16
Total CHOL/HDL Ratio: 3
Triglycerides: 116 mg/dL (ref 0.0–149.0)
VLDL: 23.2 mg/dL (ref 0.0–40.0)

## 2019-10-10 LAB — COMPREHENSIVE METABOLIC PANEL
ALT: 10 U/L (ref 0–35)
AST: 12 U/L (ref 0–37)
Albumin: 4 g/dL (ref 3.5–5.2)
Alkaline Phosphatase: 90 U/L (ref 39–117)
BUN: 34 mg/dL — ABNORMAL HIGH (ref 6–23)
CO2: 25 mEq/L (ref 19–32)
Calcium: 9.1 mg/dL (ref 8.4–10.5)
Chloride: 106 mEq/L (ref 96–112)
Creatinine, Ser: 1.04 mg/dL (ref 0.40–1.20)
GFR: 64.04 mL/min (ref >60.00)
Glucose, Bld: 92 mg/dL (ref 70–99)
Potassium: 4.1 mEq/L (ref 3.5–5.1)
Sodium: 139 mEq/L (ref 135–145)
Total Bilirubin: 0.4 mg/dL (ref 0.2–1.2)
Total Protein: 7.2 g/dL (ref 6.0–8.3)

## 2019-10-10 LAB — IBC + FERRITIN
Ferritin: 128.2 ng/mL (ref 10.0–291.0)
Iron: 45 ug/dL (ref 42–145)
Saturation Ratios: 11.9 % — ABNORMAL LOW (ref 20.0–50.0)
Transferrin: 271 mg/dL (ref 212.0–360.0)

## 2019-10-10 NOTE — Telephone Encounter (Signed)
See result notes. 

## 2019-10-10 NOTE — Telephone Encounter (Signed)
Patient returning a call to [place].   Patient would like a call back, lab results

## 2019-10-11 LAB — NOVEL CORONAVIRUS, NAA: SARS-CoV-2, NAA: NOT DETECTED

## 2019-10-11 LAB — SARS-COV-2, NAA 2 DAY TAT

## 2019-10-16 ENCOUNTER — Telehealth: Payer: Self-pay | Admitting: Family Medicine

## 2019-10-16 NOTE — Telephone Encounter (Signed)
Caller: [name] Call back phone number: [phone]  Patient states Opthalmology has not received the referral. Please re-fax to [phone].  Please advise.

## 2019-10-16 NOTE — Telephone Encounter (Signed)
refaxed referral and notified pt

## 2019-10-19 DIAGNOSIS — R69 Illness, unspecified: Secondary | ICD-10-CM | POA: Diagnosis not present

## 2019-10-19 DIAGNOSIS — J309 Allergic rhinitis, unspecified: Secondary | ICD-10-CM | POA: Diagnosis not present

## 2019-10-19 DIAGNOSIS — G43909 Migraine, unspecified, not intractable, without status migrainosus: Secondary | ICD-10-CM | POA: Diagnosis not present

## 2019-10-19 DIAGNOSIS — Z008 Encounter for other general examination: Secondary | ICD-10-CM | POA: Diagnosis not present

## 2019-10-19 DIAGNOSIS — M1990 Unspecified osteoarthritis, unspecified site: Secondary | ICD-10-CM | POA: Diagnosis not present

## 2019-10-19 DIAGNOSIS — N3281 Overactive bladder: Secondary | ICD-10-CM | POA: Diagnosis not present

## 2019-10-19 DIAGNOSIS — R32 Unspecified urinary incontinence: Secondary | ICD-10-CM | POA: Diagnosis not present

## 2019-10-19 DIAGNOSIS — K219 Gastro-esophageal reflux disease without esophagitis: Secondary | ICD-10-CM | POA: Diagnosis not present

## 2019-10-19 DIAGNOSIS — Z6841 Body Mass Index (BMI) 40.0 and over, adult: Secondary | ICD-10-CM | POA: Diagnosis not present

## 2019-10-19 DIAGNOSIS — I10 Essential (primary) hypertension: Secondary | ICD-10-CM | POA: Diagnosis not present

## 2019-10-31 DIAGNOSIS — R69 Illness, unspecified: Secondary | ICD-10-CM | POA: Diagnosis not present

## 2019-11-09 ENCOUNTER — Ambulatory Visit (INDEPENDENT_AMBULATORY_CARE_PROVIDER_SITE_OTHER): Payer: Medicare HMO | Admitting: Family Medicine

## 2019-11-09 ENCOUNTER — Encounter: Payer: Self-pay | Admitting: Family Medicine

## 2019-11-09 ENCOUNTER — Other Ambulatory Visit: Payer: Self-pay

## 2019-11-09 ENCOUNTER — Other Ambulatory Visit: Payer: Self-pay | Admitting: Family Medicine

## 2019-11-09 VITALS — BP 124/80 | HR 95 | Temp 97.9°F | Ht 61.0 in | Wt 303.5 lb

## 2019-11-09 DIAGNOSIS — E785 Hyperlipidemia, unspecified: Secondary | ICD-10-CM | POA: Diagnosis not present

## 2019-11-09 DIAGNOSIS — R5383 Other fatigue: Secondary | ICD-10-CM | POA: Diagnosis not present

## 2019-11-09 LAB — CBC
HCT: 31.6 % — ABNORMAL LOW (ref 36.0–46.0)
Hemoglobin: 10.1 g/dL — ABNORMAL LOW (ref 12.0–15.0)
MCHC: 31.9 g/dL (ref 30.0–36.0)
MCV: 83.5 fl (ref 78.0–100.0)
Platelets: 269 10*3/uL (ref 150.0–400.0)
RBC: 3.78 Mil/uL — ABNORMAL LOW (ref 3.87–5.11)
RDW: 16.9 % — ABNORMAL HIGH (ref 11.5–15.5)
WBC: 5.1 10*3/uL (ref 4.0–10.5)

## 2019-11-09 LAB — LIPID PANEL
Cholesterol: 193 mg/dL (ref 0–200)
HDL: 63 mg/dL (ref >39.00)
LDL Cholesterol: 111 mg/dL — ABNORMAL HIGH (ref 0–99)
NonHDL: 129.56
Total CHOL/HDL Ratio: 3
Triglycerides: 95 mg/dL (ref 0.0–149.0)
VLDL: 19 mg/dL (ref 0.0–40.0)

## 2019-11-09 LAB — MICROALBUMIN / CREATININE URINE RATIO
Creatinine,U: 101.2 mg/dL
Microalb Creat Ratio: 3 mg/g (ref 0.0–30.0)
Microalb, Ur: 3 mg/dL — ABNORMAL HIGH (ref 0.0–1.9)

## 2019-11-09 LAB — T4, FREE: Free T4: 0.91 ng/dL (ref 0.60–1.60)

## 2019-11-09 LAB — TSH: TSH: 2.65 u[IU]/mL (ref 0.35–4.50)

## 2019-11-09 NOTE — Addendum Note (Signed)
Addended by: Sharon Seller B on: 11/09/2019 04:41 PM   Modules accepted: Orders

## 2019-11-09 NOTE — Patient Instructions (Signed)
Give [place] 2-3 business days to get the results of your labs back.   If labs are normal, we will get an ultrasound of your heart (echocardiogram).   Reach out to your sleep doctor with your symptoms.   Keep the diet clean and stay active.  Let us know if you need anything.

## 2019-11-09 NOTE — Progress Notes (Signed)
Chief Complaint  Patient presents with  . Fatigue    Subjective: Patient is a 69 y.o. female here for fatigue.  Overe past couple mo, pt has been having worse fatigue. She will get DOE as well. She has a famhx of heart disease. Mild CAD per cath in 2015. She has OSA on CPAP, recently saw her sleep specialist, but she did not bring up this compliant. Mood is good. She will nap 4-5 hrs at a time and sleep 7-8 hrs at night as well. Diet could be better. No unexplained wt changes. No cough or wheezing.   Past Medical History:  Diagnosis Date  . Anemia   . Anxiety   . Arthritis    "knees" (04/26/2016)  . Colon polyps    benign per pt  . Coronary artery disease    mild per 2015 cath in [place] (OM1 30%, RCA 30%)  . GERD (gastroesophageal reflux disease)   . Gout   . History of hiatal hernia   . Hypertension   . Migraine    "none since early /2017" (05/06/2016)  . Obesity   . OSA on CPAP    uses CPAP  . Pre-diabetes     Objective: BP 124/80 (BP Location: Left Arm, Patient Position: Sitting, Cuff Size: Large)   Pulse 95   Temp 97.9 F (36.6 C) (Oral)   Ht 5\' 1"  (1.549 m)   Wt (!) 303 lb 8 oz (137.7 kg)   SpO2 96%   BMI 57.35 kg/m  General: Awake, appears stated age HEENT: MMM, EOMi Neck: supple, symmetric, no thyromegaly Heart: RRR, no bruits Lungs: CTAB, no rales, wheezes or rhonchi. No accessory muscle use Psych: Age appropriate judgment and insight, normal affect and mood  Assessment and Plan: Fatigue, unspecified type - Plan: CBC, T4, free, TSH, Vit D  Hyperlipidemia, unspecified hyperlipidemia type - Plan: Lipid panel  Orders as above.  If labs are normal, we will get an echo. If that is normal, will offer more resources for weight loss. She is interested in a novel form of bariatric surgery that is not covered by insurance at this time.  F/u pending above.  The patient voiced understanding and agreement to the plan.  [place], DO 11/09/19  2:03  PM

## 2019-11-11 ENCOUNTER — Other Ambulatory Visit: Payer: Self-pay | Admitting: Family Medicine

## 2019-11-11 DIAGNOSIS — R0600 Dyspnea, unspecified: Secondary | ICD-10-CM

## 2019-11-11 DIAGNOSIS — R5383 Other fatigue: Secondary | ICD-10-CM

## 2019-11-11 DIAGNOSIS — R0609 Other forms of dyspnea: Secondary | ICD-10-CM

## 2019-11-12 ENCOUNTER — Other Ambulatory Visit (INDEPENDENT_AMBULATORY_CARE_PROVIDER_SITE_OTHER): Payer: Medicare HMO

## 2019-11-12 ENCOUNTER — Other Ambulatory Visit: Payer: Self-pay | Admitting: Family Medicine

## 2019-11-12 DIAGNOSIS — E559 Vitamin D deficiency, unspecified: Secondary | ICD-10-CM

## 2019-11-12 DIAGNOSIS — R5383 Other fatigue: Secondary | ICD-10-CM | POA: Diagnosis not present

## 2019-11-12 LAB — IBC + FERRITIN
Ferritin: 133.9 ng/mL (ref 10.0–291.0)
Iron: 49 ug/dL (ref 42–145)
Saturation Ratios: 12.2 % — ABNORMAL LOW (ref 20.0–50.0)
Transferrin: 287 mg/dL (ref 212.0–360.0)

## 2019-11-12 LAB — VITAMIN D 25 HYDROXY (VIT D DEFICIENCY, FRACTURES): VITD: 13.46 ng/mL — ABNORMAL LOW (ref 30.00–100.00)

## 2019-11-12 MED ORDER — VITAMIN D (ERGOCALCIFEROL) 1.25 MG (50000 UNIT) PO CAPS
50000.0000 [IU] | ORAL_CAPSULE | ORAL | 0 refills | Status: DC
Start: 2019-11-12 — End: 2020-04-11

## 2019-11-14 ENCOUNTER — Other Ambulatory Visit: Payer: Self-pay

## 2019-11-14 ENCOUNTER — Ambulatory Visit: Payer: Medicare HMO | Admitting: Podiatry

## 2019-11-14 DIAGNOSIS — B351 Tinea unguium: Secondary | ICD-10-CM | POA: Diagnosis not present

## 2019-11-14 DIAGNOSIS — M79676 Pain in unspecified toe(s): Secondary | ICD-10-CM

## 2019-11-14 DIAGNOSIS — L989 Disorder of the skin and subcutaneous tissue, unspecified: Secondary | ICD-10-CM

## 2019-11-14 NOTE — Progress Notes (Signed)
    Subjective: Patient is a 68 y.o. female presenting to the office today for follow up evaluation of painful callus lesions noted to the bilateral feet. Walking and bearing weight increases the pain. She has not had any recent treatment. Patient also complains of elongated, thickened nails that cause pain while ambulating in shoes. She is unable to trim her own nails. Patient presents today for further treatment and evaluation.  Past Medical History:  Diagnosis Date  . Anemia   . Anxiety   . Arthritis    "knees" (04/26/2016)  . Colon polyps    benign per pt  . Coronary artery disease    mild per 2015 cath in [place] (OM1 30%, RCA 30%)  . GERD (gastroesophageal reflux disease)   . Gout   . History of hiatal hernia   . Hypertension   . Migraine    "none since early /2017" (05/06/2016)  . Obesity   . OSA on CPAP    uses CPAP  . Pre-diabetes     Objective:  Physical Exam General: Alert and oriented x3 in no acute distress  Dermatology: Hyperkeratotic lesions present on the bilateral feet x 4. Pain on palpation with a central nucleated core noted. Skin is warm, dry and supple bilateral lower extremities. Negative for open lesions or macerations. Nails are tender, long, thickened and dystrophic with subungual debris, consistent with onychomycosis, 1-5 bilateral. No signs of infection noted.  Vascular: Palpable pedal pulses bilaterally. No edema or erythema noted. Capillary refill within normal limits.  Neurological: Epicritic and protective threshold grossly intact bilaterally.   Musculoskeletal Exam: Pain on palpation at the keratotic lesions noted. Range of motion within normal limits bilateral. Muscle strength 5/5 in all groups bilateral.  Assessment: 1. Onychodystrophic nails 1-5 bilateral with hyperkeratosis of nails.  2. Onychomycosis of nail due to dermatophyte bilateral 3. Pre-ulcerative callus lesions noted to the bilateral feet x 4   Plan of Care:  1. Patient  evaluated. 2. Excisional debridement of keratoic lesion using a chisel blade was performed without incident.  3. Dressed with light dressing. 4. Mechanical debridement of nails 1-5 bilaterally performed using a nail nipper. Filed with dremel without incident.  5. Patient is to return to the clinic in 3 months.   Etai Copado M. Lorrie Gargan, DPM [facility]  Dr. Bernedette Auston M. Oma Marzan, DPM    [address]                                        [place], [place] [address]                Office [phone]  Fax [phone]   

## 2019-11-14 NOTE — Telephone Encounter (Signed)
FYI message from patient

## 2019-11-15 DIAGNOSIS — R0989 Other specified symptoms and signs involving the circulatory and respiratory systems: Secondary | ICD-10-CM | POA: Insufficient documentation

## 2019-11-15 DIAGNOSIS — Z9089 Acquired absence of other organs: Secondary | ICD-10-CM | POA: Diagnosis not present

## 2019-11-15 DIAGNOSIS — R198 Other specified symptoms and signs involving the digestive system and abdomen: Secondary | ICD-10-CM | POA: Insufficient documentation

## 2019-11-15 DIAGNOSIS — K219 Gastro-esophageal reflux disease without esophagitis: Secondary | ICD-10-CM | POA: Insufficient documentation

## 2019-11-16 DIAGNOSIS — H2513 Age-related nuclear cataract, bilateral: Secondary | ICD-10-CM | POA: Diagnosis not present

## 2019-11-16 DIAGNOSIS — H52203 Unspecified astigmatism, bilateral: Secondary | ICD-10-CM | POA: Diagnosis not present

## 2019-11-16 DIAGNOSIS — H524 Presbyopia: Secondary | ICD-10-CM | POA: Diagnosis not present

## 2019-11-23 ENCOUNTER — Other Ambulatory Visit: Payer: Medicare HMO

## 2019-11-28 ENCOUNTER — Other Ambulatory Visit (HOSPITAL_BASED_OUTPATIENT_CLINIC_OR_DEPARTMENT_OTHER): Payer: Self-pay | Admitting: Family Medicine

## 2019-11-28 DIAGNOSIS — Z1231 Encounter for screening mammogram for malignant neoplasm of breast: Secondary | ICD-10-CM

## 2019-11-28 DIAGNOSIS — G4733 Obstructive sleep apnea (adult) (pediatric): Secondary | ICD-10-CM | POA: Diagnosis not present

## 2019-12-03 ENCOUNTER — Encounter (HOSPITAL_BASED_OUTPATIENT_CLINIC_OR_DEPARTMENT_OTHER): Payer: Self-pay

## 2019-12-03 ENCOUNTER — Ambulatory Visit (HOSPITAL_BASED_OUTPATIENT_CLINIC_OR_DEPARTMENT_OTHER)
Admission: RE | Admit: 2019-12-03 | Discharge: 2019-12-03 | Disposition: A | Discharge status: Home / Self Care | Payer: Medicare HMO | Source: Ambulatory Visit | Attending: Family Medicine | Admitting: Family Medicine

## 2019-12-03 ENCOUNTER — Other Ambulatory Visit: Payer: Self-pay

## 2019-12-03 DIAGNOSIS — Z1231 Encounter for screening mammogram for malignant neoplasm of breast: Secondary | ICD-10-CM | POA: Diagnosis not present

## 2019-12-03 DIAGNOSIS — R5383 Other fatigue: Secondary | ICD-10-CM | POA: Diagnosis not present

## 2019-12-03 DIAGNOSIS — R0609 Other forms of dyspnea: Secondary | ICD-10-CM

## 2019-12-03 DIAGNOSIS — R0600 Dyspnea, unspecified: Secondary | ICD-10-CM | POA: Diagnosis not present

## 2019-12-03 LAB — ECHOCARDIOGRAM COMPLETE
Area-P 1/2: 3.23 cm2
S' Lateral: 2.69 cm
Single Plane A4C EF: 69.8 %

## 2019-12-03 NOTE — Progress Notes (Signed)
  Echocardiogram 2D Echocardiogram has been performed.  Merrie Roof F 12/03/2019, 11:30 AM

## 2019-12-03 NOTE — Progress Notes (Signed)
  Echocardiogram 2D Echocardiogram has been performed.  Merrie Roof F 12/03/2019, 11:29 AM

## 2019-12-29 DIAGNOSIS — G4733 Obstructive sleep apnea (adult) (pediatric): Secondary | ICD-10-CM | POA: Diagnosis not present

## 2020-01-10 ENCOUNTER — Other Ambulatory Visit: Payer: Self-pay | Admitting: Family Medicine

## 2020-01-10 MED ORDER — ESOMEPRAZOLE MAGNESIUM 40 MG PO CPDR: 40.0000 mg | DELAYED_RELEASE_CAPSULE | Freq: Every day | ORAL | 1 refills | Status: DC

## 2020-01-15 ENCOUNTER — Other Ambulatory Visit: Payer: Self-pay

## 2020-01-15 ENCOUNTER — Other Ambulatory Visit: Payer: Medicare HMO

## 2020-01-15 DIAGNOSIS — Z20822 Contact with and (suspected) exposure to covid-19: Secondary | ICD-10-CM | POA: Diagnosis not present

## 2020-01-17 LAB — SARS-COV-2, NAA 2 DAY TAT

## 2020-01-17 LAB — NOVEL CORONAVIRUS, NAA: SARS-CoV-2, NAA: NOT DETECTED

## 2020-01-18 ENCOUNTER — Encounter (HOSPITAL_COMMUNITY): Payer: Self-pay

## 2020-01-18 ENCOUNTER — Ambulatory Visit (HOSPITAL_COMMUNITY)
Admission: EM | Admit: 2020-01-18 | Discharge: 2020-01-18 | Disposition: A | Discharge status: Home / Self Care | Payer: Medicare HMO

## 2020-01-18 ENCOUNTER — Other Ambulatory Visit: Payer: Self-pay

## 2020-01-18 DIAGNOSIS — I872 Venous insufficiency (chronic) (peripheral): Secondary | ICD-10-CM

## 2020-01-18 DIAGNOSIS — L03116 Cellulitis of left lower limb: Secondary | ICD-10-CM

## 2020-01-18 DIAGNOSIS — L03115 Cellulitis of right lower limb: Secondary | ICD-10-CM

## 2020-01-18 MED ORDER — DOXYCYCLINE HYCLATE 100 MG PO CAPS: 100.0000 mg | ORAL_CAPSULE | Freq: Two times a day (BID) | ORAL | 0 refills | Status: AC

## 2020-01-18 MED ORDER — CEPHALEXIN 500 MG PO CAPS: 500.0000 mg | ORAL_CAPSULE | Freq: Four times a day (QID) | ORAL | 0 refills | Status: DC

## 2020-01-18 MED ORDER — ACETAMINOPHEN 325 MG PO TABS
650.0000 mg | ORAL_TABLET | Freq: Four times a day (QID) | ORAL | 0 refills | Status: DC | PRN
Start: 2020-01-18 — End: 2020-12-24

## 2020-01-18 MED ORDER — TRAMADOL HCL 50 MG PO TABS: 50.0000 mg | ORAL_TABLET | Freq: Four times a day (QID) | ORAL | 0 refills | Status: DC | PRN

## 2020-01-18 NOTE — ED Provider Notes (Signed)
[facility]    CSN: [id number] Arrival date & time: 01/18/20  0901      History   Chief Complaint Chief Complaint  Patient presents with  . Recurrent Skin Infections    both legs    HPI Christine [name] is a 69 y.o. female.   Patient with history of stasis dermatitis and bilateral lower extremity cellulitis presents for concern of cellulitis in both her lower extremities.  She reports over the last 3 to 4 days she has had worsening pain in both of her legs.  She has had redness developing.  She does report 2 to 3 days ago she had increased swelling in both legs, that has improved some but the pain and redness has persisted.  She has been taking her fluid pills as prescribed.  She has been trying to elevate her legs.  She reports about a week ago she had a low-grade temperature which prompted her to get Covid testing.  This was negative.  She has not taken her temperature at home and has been feeling otherwise well other than the pain in her legs.  She has not had any nausea vomiting or or body aches.  She reports she has had cellulitis in both lower extremities before and has responded very well to antibiotics and pain medicine.  Per chart review this is confirmed in 01/31/2019.     Past Medical History:  Diagnosis Date  . Anemia   . Anxiety   . Arthritis    "knees" (04/26/2016)  . Colon polyps    benign per pt  . Coronary artery disease    mild per 2015 cath in [place] (OM1 30%, RCA 30%)  . GERD (gastroesophageal reflux disease)   . Gout   . History of hiatal hernia   . Hypertension   . Migraine    "none since early /2017" (05/06/2016)  . Obesity   . OSA on CPAP    uses CPAP  . Pre-diabetes     Patient Active Problem List   Diagnosis Date Noted  . Throat clearing 10/05/2019  . Acute pain of right knee 05/17/2019  . Abdominal pain 03/31/2019  . Morbid obesity ([place]) 03/29/2018  . GAD (generalized anxiety disorder) 02/27/2018  . Mixed incontinence urge and  stress 02/27/2018  . OSA on CPAP 06/16/2017  . Acute appendicitis 05/06/2016  . Gastro-esophageal reflux disease with esophagitis 07/26/2011  . Hypertension, benign 07/26/2011  . Osteoarthrosis 10/16/2009  . 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 Pre-operative cardiovascular examination 08/22/2008    Past Surgical History:  Procedure Laterality Date  . ABDOMINAL HYSTERECTOMY     "partial"; both ovaries present  . CARDIAC CATHETERIZATION  ~ 2015  . CARDIAC CATHETERIZATION  2015   In [place]  . CHILECTOMY Right 06/01/2017   Procedure: CHILECTOMY RIGHT FOOT;  Surgeon: Edrick Kins, DPM;  Location: [facility];  Service: Podiatry;  Laterality: Right;  . LAPAROSCOPIC APPENDECTOMY N/A 05/06/2016   Procedure: LAPAROSCOPIC APPENDECTOMY;  Surgeon: Donnie Mesa, MD;  Location: [facility];  Service: General;  Laterality: N/A;  . TONSILLECTOMY      OB History    Gravida  0   Para  0   Term  0   Preterm  0   AB  0   Living  0     SAB  0   TAB  0   Ectopic  0   Multiple  0  Live Births  0            Home Medications    Prior to Admission medications   Medication Sig Start Date End Date Taking? Authorizing Provider  acetaminophen (TYLENOL) 325 MG tablet Take 2 tablets (650 mg total) by mouth every 6 (six) hours as needed. 01/18/20   Florence Yeung, Marguerita Beards, PA-C  aspirin 81 MG EC tablet Take by mouth.    [provider]  cephALEXin (KEFLEX) 500 MG capsule Take 1 capsule (500 mg total) by mouth 4 (four) times daily. 01/18/20   Leighann Amadon, Marguerita Beards, PA-C  doxycycline (VIBRAMYCIN) 100 MG capsule Take 1 capsule (100 mg total) by mouth 2 (two) times daily for 7 days. 01/18/20 01/25/20  Lempi Edwin, Marguerita Beards, PA-C  DULoxetine (CYMBALTA) 30 MG capsule Take 1 capsule (30 mg total) by mouth daily. 07/18/19   Shelda Pal, DO  esomeprazole (NEXIUM) 40 MG capsule Take 1 capsule (40 mg total) by mouth daily. 01/10/20   Wendling,  Crosby Oyster, DO  fexofenadine (ALLEGRA) 180 MG tablet Take 180 mg by mouth daily.    [provider]  ibuprofen (ADVIL) 800 MG tablet TAKE 1 TABLET BY MOUTH EVERY 8 HOURS AS NEEDED 11/09/19   Wendling, Crosby Oyster, DO  iron polysaccharides (NIFEREX) 150 MG capsule Take 150 mg by mouth daily.    [provider]  metoprolol tartrate (LOPRESSOR) 50 MG tablet Take 1 tablet (50 mg total) by mouth daily. 04/24/19   Shelda Pal, DO  oxybutynin (DITROPAN XL) 15 MG 24 hr tablet Take 1 tablet (15 mg total) by mouth at bedtime. 09/28/19   Wendling, Crosby Oyster, DO  traMADol (ULTRAM) 50 MG tablet Take 1 tablet (50 mg total) by mouth every 6 (six) hours as needed for moderate pain or severe pain. 01/18/20   Dadrian Ballantine, Marguerita Beards, PA-C  triamterene-hydrochlorothiazide (MAXZIDE) 75-50 MG tablet TAKE 1 TABLET BY MOUTH EVERY DAY IN THE EVENING 11/12/19   Wendling, Crosby Oyster, DO  Vitamin D, Ergocalciferol, (DRISDOL) 1.25 MG (50000 UNIT) CAPS capsule Take 1 capsule (50,000 Units total) by mouth every 7 (seven) days. 11/12/19   Shelda Pal, DO    Family History Family History  Problem Relation Age of Onset  . Diabetes Mother   . Heart disease Mother   . High blood pressure Mother   . Asthma Mother   . Diabetes Father   . Heart disease Father   . High blood pressure Father   . Asthma Father   . Diabetes Sister   . Colon cancer Neg Hx   . Esophageal cancer Neg Hx   . Rectal cancer Neg Hx     Social History Social History   Tobacco Use  . Smoking status: Former Smoker    Packs/day: 0.12    Years: 5.00    Pack years: 0.60    Types: Cigarettes    Quit date: 1980    Years since quitting: 41.7  . Smokeless tobacco: Never Used  Vaping Use  . Vaping Use: Never used  Substance Use Topics  . Alcohol use: No  . Drug use: No     Allergies   Prednisone, Tape, and Sulfa antibiotics   Review of Systems Review of Systems   Physical Exam Triage Vital Signs ED  Triage Vitals  Enc Vitals Group     BP 01/18/20 1034 (!) 157/82     Pulse Rate 01/18/20 1034 81     Resp 01/18/20 1034 20  Temp 01/18/20 1034 99 F (37.2 C)     Temp Source 01/18/20 1034 Oral     SpO2 01/18/20 1034 98 %     Weight --      Height --      Head Circumference --      Peak Flow --      Pain Score 01/18/20 1033 10     Pain Loc --      Pain Edu? --      Excl. in [place]? --    No data found.  Updated Vital Signs BP (!) 157/82 (BP Location: Right Arm)   Pulse 81   Temp 99 F (37.2 C) (Oral)   Resp 20   SpO2 98%   Visual Acuity Right Eye Distance:   Left Eye Distance:   Bilateral Distance:    Right Eye Near:   Left Eye Near:    Bilateral Near:     Physical Exam Vitals and nursing note reviewed.  Constitutional:      General: She is not in acute distress.    Appearance: She is well-developed. She is not ill-appearing.  HENT:     Head: Normocephalic and atraumatic.  Eyes:     Conjunctiva/sclera: Conjunctivae normal.  Cardiovascular:     Rate and Rhythm: Normal rate and regular rhythm.  Pulmonary:     Effort: Pulmonary effort is normal. No respiratory distress.     Breath sounds: Normal breath sounds.  Abdominal:     Palpations: Abdomen is soft.     Tenderness: There is no abdominal tenderness.  Musculoskeletal:     Cervical back: Neck supple.     Comments: Bilateral lower extremity skin hardening below the calf, with stasis changes.  Visualized below.  There is erythema throughout the areas of the skin changes.  There is erythema proximal and distal as well.  Mildly warm.  Tender to touch.  Cap refill less than 2 seconds in the distal extremities, unable to palpate pulse due to edema.  Sensation is intact.  Freely moving all digits and is ambulatory.  Skin:    General: Skin is warm and dry.  Neurological:     Mental Status: She is alert.        UC Treatments / Results  Labs (all labs ordered are listed, but only abnormal results are  displayed) Labs Reviewed - No data to display  EKG   Radiology No results found.  Procedures Procedures (including critical care time)  Medications Ordered in UC Medications - No data to display  Initial Impression / Assessment and Plan / UC Course  I have reviewed the triage vital signs and the nursing notes.  Pertinent labs & imaging results that were available during my care of the patient were reviewed by me and considered in my medical decision making (see chart for details).     #Stasis dermatitis #Cellulitis Patient is a 69 year old presenting with appears to be both a stasis dermatitis and cellulitis.  Chart review shows10/10/2018 visit with primary care, for same, picture there appears much better than today's current presentation.  Patient was treated with doxycycline and tramadol at that visit.  We will treat her with doxycycline, Keflex and tramadol here.  She is afebrile otherwise well-appearing.  We will have her follow-up closely with her primary care.  Strict emergency department precautions were given.  Patient verbalized agreement and understanding this plan of care. Final Clinical Impressions(s) / UC Diagnoses   Final diagnoses:  Cellulitis of both lower extremities  Venous stasis dermatitis of both lower extremities     Discharge Instructions     Take the doxycycline and Keflex as prescribed -Take these with plenty water  Take the tramadol as prescribed up to every 6 hours for severe pain, do not drive drink alcohol or operate machinery after taking  You may take Tylenol for additional pain relief as prescribed  Call your primary care provider today to have follow-up early next week  If you develop worsening symptoms, high fevers, feel very poorly you need to return or go to the emergency department      ED Prescriptions    Medication Sig Dispense Auth. Provider   doxycycline (VIBRAMYCIN) 100 MG capsule Take 1 capsule (100 mg total) by mouth 2  (two) times daily for 7 days. 14 capsule Izaia Say, Marguerita Beards, PA-C   cephALEXin (KEFLEX) 500 MG capsule Take 1 capsule (500 mg total) by mouth 4 (four) times daily. 28 capsule Isaic Syler, Marguerita Beards, PA-C   traMADol (ULTRAM) 50 MG tablet Take 1 tablet (50 mg total) by mouth every 6 (six) hours as needed for moderate pain or severe pain. 15 tablet Lashondra Vaquerano, Marguerita Beards, PA-C   acetaminophen (TYLENOL) 325 MG tablet Take 2 tablets (650 mg total) by mouth every 6 (six) hours as needed. 30 tablet Georgena Weisheit, Marguerita Beards, PA-C     I have reviewed the PDMP during this encounter.   Purnell Shoemaker, PA-C 01/18/20 1117

## 2020-01-18 NOTE — ED Triage Notes (Signed)
Pt present cellulitis in both legs, this is a recurrent situation per patient. Pt legs are in pain and red.

## 2020-01-18 NOTE — Discharge Instructions (Signed)
Take the doxycycline and Keflex as prescribed -Take these with plenty water  Take the tramadol as prescribed up to every 6 hours for severe pain, do not drive drink alcohol or operate machinery after taking  You may take Tylenol for additional pain relief as prescribed  Call your primary care provider today to have follow-up early next week  If you develop worsening symptoms, high fevers, feel very poorly you need to return or go to the emergency department

## 2020-01-24 DIAGNOSIS — G4733 Obstructive sleep apnea (adult) (pediatric): Secondary | ICD-10-CM | POA: Diagnosis not present

## 2020-01-28 ENCOUNTER — Other Ambulatory Visit: Payer: Self-pay

## 2020-01-28 ENCOUNTER — Ambulatory Visit (INDEPENDENT_AMBULATORY_CARE_PROVIDER_SITE_OTHER): Payer: Medicare HMO | Admitting: Family Medicine

## 2020-01-28 ENCOUNTER — Encounter: Payer: Self-pay | Admitting: Family Medicine

## 2020-01-28 VITALS — BP 138/84 | HR 68 | Temp 98.0°F | Ht 61.0 in | Wt 301.1 lb

## 2020-01-28 DIAGNOSIS — Z09 Encounter for follow-up examination after completed treatment for conditions other than malignant neoplasm: Secondary | ICD-10-CM | POA: Diagnosis not present

## 2020-01-28 DIAGNOSIS — G4733 Obstructive sleep apnea (adult) (pediatric): Secondary | ICD-10-CM | POA: Diagnosis not present

## 2020-01-28 DIAGNOSIS — R531 Weakness: Secondary | ICD-10-CM | POA: Diagnosis not present

## 2020-01-28 DIAGNOSIS — Z23 Encounter for immunization: Secondary | ICD-10-CM | POA: Diagnosis not present

## 2020-01-28 NOTE — Patient Instructions (Signed)
Good seeing you and glad you are better.  Keep the diet clean and stay active.  Let us know if you need anything.

## 2020-01-28 NOTE — Progress Notes (Signed)
Chief Complaint  Patient presents with   Follow-up    urgent care visit    Subjective: Patient is a 69 y.o. female here for weakness.  Dx'd w b/l cellulitis in UC and completed Doxy and Keflex. This has resolved. She is eating and drinking normally. No unexplained wt changes.  Still having some fatigue but this is getting better.  Past Medical History:  Diagnosis Date   Anemia    Anxiety    Arthritis    "knees" (04/26/2016)   Colon polyps    benign per pt   Coronary artery disease    mild per 2015 cath in [place] (OM1 30%, RCA 30%)   GERD (gastroesophageal reflux disease)    Gout    History of hiatal hernia    Hypertension    Migraine    "none since early /2017" (05/06/2016)   Obesity    OSA on CPAP    uses CPAP   Pre-diabetes     Objective: BP 138/84 (BP Location: Left Arm, Patient Position: Sitting, Cuff Size: Normal)    Pulse 68    Temp 98 F (36.7 C) (Oral)    Ht 5\' 1"  (1.549 m)    Wt (!) 301 lb 2 oz (136.6 kg)    SpO2 100%    BMI 56.90 kg/m  General: Awake, appears stated age Heart: RRR Lungs: CTAB, no rales, wheezes or rhonchi. No accessory muscle use Psych: Age appropriate judgment and insight, normal affect and mood  Assessment and Plan: Weakness  Follow-up for resolved condition  Need for influenza vaccination - Plan: Flu Vaccine QUAD High Dose(Fluad)  Continue to push fluids and eat normally.  She has completed her antibiotics.  I have nothing to add today. Follow-up as originally scheduled. The patient voiced understanding and agreement to the plan.  [place], DO 01/28/20  4:39 PM

## 2020-02-08 NOTE — Addendum Note (Signed)
Addended by: Kelle Darting A on: 02/08/2020 03:22 PM   Modules accepted: Orders

## 2020-02-12 ENCOUNTER — Other Ambulatory Visit: Payer: Medicare HMO

## 2020-02-12 ENCOUNTER — Other Ambulatory Visit: Payer: Self-pay

## 2020-02-12 DIAGNOSIS — E559 Vitamin D deficiency, unspecified: Secondary | ICD-10-CM | POA: Diagnosis not present

## 2020-02-12 LAB — VITAMIN D 25 HYDROXY (VIT D DEFICIENCY, FRACTURES): Vit D, 25-Hydroxy: 41 ng/mL (ref 30–100)

## 2020-02-18 ENCOUNTER — Ambulatory Visit: Payer: Medicare HMO | Admitting: Podiatry

## 2020-02-18 ENCOUNTER — Other Ambulatory Visit: Payer: Self-pay

## 2020-02-18 DIAGNOSIS — B351 Tinea unguium: Secondary | ICD-10-CM

## 2020-02-18 DIAGNOSIS — L989 Disorder of the skin and subcutaneous tissue, unspecified: Secondary | ICD-10-CM | POA: Diagnosis not present

## 2020-02-18 DIAGNOSIS — M79676 Pain in unspecified toe(s): Secondary | ICD-10-CM

## 2020-02-18 NOTE — Progress Notes (Signed)
    Subjective: Patient is a 69 y.o. female presenting to the office today for follow up evaluation of painful callus lesions noted to the bilateral feet. Walking and bearing weight increases the pain. She has not had any recent treatment. Patient also complains of elongated, thickened nails that cause pain while ambulating in shoes. She is unable to trim her own nails. Patient presents today for further treatment and evaluation.  Past Medical History:  Diagnosis Date  . Anemia   . Anxiety   . Arthritis    "knees" (04/26/2016)  . Colon polyps    benign per pt  . Coronary artery disease    mild per 2015 cath in [place] (OM1 30%, RCA 30%)  . GERD (gastroesophageal reflux disease)   . Gout   . History of hiatal hernia   . Hypertension   . Migraine    "none since early /2017" (05/06/2016)  . Obesity   . OSA on CPAP    uses CPAP  . Pre-diabetes     Objective:  Physical Exam General: Alert and oriented x3 in no acute distress  Dermatology: Hyperkeratotic lesions present on the bilateral feet x 4. Pain on palpation with a central nucleated core noted. Skin is warm, dry and supple bilateral lower extremities. Negative for open lesions or macerations. Nails are tender, long, thickened and dystrophic with subungual debris, consistent with onychomycosis, 1-5 bilateral. No signs of infection noted.  Vascular: Palpable pedal pulses bilaterally. No edema or erythema noted. Capillary refill within normal limits.  Neurological: Epicritic and protective threshold grossly intact bilaterally.   Musculoskeletal Exam: Pain on palpation at the keratotic lesions noted. Range of motion within normal limits bilateral. Muscle strength 5/5 in all groups bilateral.  Assessment: 1. Onychodystrophic nails 1-5 bilateral with hyperkeratosis of nails.  2. Onychomycosis of nail due to dermatophyte bilateral 3. Pre-ulcerative callus lesions noted to the bilateral feet x 4   Plan of Care:  1. Patient  evaluated. 2. Excisional debridement of keratoic lesion using a chisel blade was performed without incident.  3. Dressed with light dressing. 4. Mechanical debridement of nails 1-5 bilaterally performed using a nail nipper. Filed with dremel without incident.  5. Patient is to return to the clinic in 3 months.   Edrick Kins, DPM [facility]  Dr. Edrick Kins, [address]                                        [place], [place] [address]                Office [phone]  Fax [phone]

## 2020-02-27 ENCOUNTER — Other Ambulatory Visit: Payer: Self-pay | Admitting: Family Medicine

## 2020-03-18 ENCOUNTER — Other Ambulatory Visit: Payer: Self-pay | Admitting: Family Medicine

## 2020-03-18 DIAGNOSIS — N3946 Mixed incontinence: Secondary | ICD-10-CM

## 2020-03-18 MED ORDER — OXYBUTYNIN CHLORIDE ER 15 MG PO TB24: 15.0000 mg | ORAL_TABLET | Freq: Every day | ORAL | 4 refills | Status: DC

## 2020-04-11 ENCOUNTER — Encounter: Payer: Self-pay | Admitting: Family Medicine

## 2020-04-11 ENCOUNTER — Other Ambulatory Visit: Payer: Self-pay

## 2020-04-11 ENCOUNTER — Ambulatory Visit (INDEPENDENT_AMBULATORY_CARE_PROVIDER_SITE_OTHER): Payer: Medicare HMO | Admitting: Family Medicine

## 2020-04-11 VITALS — BP 136/82 | HR 99 | Temp 97.9°F | Ht 61.5 in | Wt 301.0 lb

## 2020-04-11 DIAGNOSIS — R69 Illness, unspecified: Secondary | ICD-10-CM | POA: Diagnosis not present

## 2020-04-11 DIAGNOSIS — F411 Generalized anxiety disorder: Secondary | ICD-10-CM | POA: Diagnosis not present

## 2020-04-11 DIAGNOSIS — I10 Essential (primary) hypertension: Secondary | ICD-10-CM | POA: Diagnosis not present

## 2020-04-11 MED ORDER — TRIAMTERENE-HCTZ 75-50 MG PO TABS: 1.0000 | ORAL_TABLET | Freq: Every day | ORAL | 8 refills | Status: DC

## 2020-04-11 MED ORDER — DULOXETINE HCL 60 MG PO CPEP: 60.0000 mg | ORAL_CAPSULE | Freq: Every day | ORAL | 3 refills | Status: DC

## 2020-04-11 NOTE — Patient Instructions (Addendum)
Keep the diet clean and stay active.  Please consider counseling. Contact [phone] to schedule an appointment or inquire about cost/insurance coverage.  Coping skills Choose 5 that work for you:  Take a deep breath  Count to 20  Read a book  Do a puzzle  Meditate  Bake  Sing  Knit  Garden  Pray  Go outside  Call a friend  Listen to music  Take a walk  Color  Send a note  Take a bath  Watch a movie  Be alone in a quiet place  Pet an animal  Visit a friend  Journal  Exercise  Stretch   Let us know if you need anything.

## 2020-04-11 NOTE — Progress Notes (Signed)
Chief Complaint  Patient presents with  . Follow-up    Subjective Christine [name] is a 69 y.o. female who presents for hypertension follow up. She does not routinely monitor home blood pressures. She is compliant with medications- Maxzide 75-50 mg/d. Patient has these side effects of medication: none She is usually adhering to a healthy diet overall. Current exercise: none  GAD Hx of GAD on Cymbalta 30 mg/d, no AE's, compliant. Feels like it is not working as well over the past 2 weeks. Gets anxious when it gets dark out.  She realizes that she has no reason for this to happen, but it still happens.  No inciting event 2 weeks ago.  No HI or SI. No self medication. Not following with a counselor/psychologist.    Past Medical History:  Diagnosis Date  . Anemia   . Anxiety   . Arthritis    "knees" (04/26/2016)  . Colon polyps    benign per pt  . Coronary artery disease    mild per 2015 cath in [place] (OM1 30%, RCA 30%)  . GERD (gastroesophageal reflux disease)   . Gout   . History of hiatal hernia   . Hypertension   . Migraine    "none since early /2017" (05/06/2016)  . Obesity   . OSA on CPAP    uses CPAP  . Pre-diabetes     Exam BP 136/82 (BP Location: Left Arm, Patient Position: Sitting, Cuff Size: Large)   Pulse 99   Temp 97.9 F (36.6 C) (Oral)   Ht 5' 1.5" (1.562 m)   Wt (!) 301 lb (136.5 kg)   SpO2 96%   BMI 55.95 kg/m  General:  well developed, well nourished, in no apparent distress Heart: RRR, no bruits, no LE edema Lungs: clear to auscultation, no accessory muscle use Psych: well oriented with normal range of affect and appropriate judgment/insight  Essential hypertension  GAD (generalized anxiety disorder)  1.  Continue Maxide 75-50 mg daily.  Does not need to monitor blood pressures at home unless she wishes.  Counseled on diet and exercise. 2.  LB [facility] information provided.  Anxiety coping techniques provided.  We will increase the dosage of her  Cymbalta from 30 mg daily to 60 mg daily.  I will see her in 1 month for this. The patient voiced understanding and agreement to the plan.  [place], DO 04/11/20  10:31 AM

## 2020-04-15 ENCOUNTER — Other Ambulatory Visit: Payer: Self-pay | Admitting: Family Medicine

## 2020-04-21 DIAGNOSIS — G4733 Obstructive sleep apnea (adult) (pediatric): Secondary | ICD-10-CM | POA: Diagnosis not present

## 2020-04-22 DIAGNOSIS — M793 Panniculitis, unspecified: Secondary | ICD-10-CM | POA: Diagnosis not present

## 2020-04-28 ENCOUNTER — Other Ambulatory Visit: Payer: Self-pay

## 2020-04-28 ENCOUNTER — Ambulatory Visit: Payer: Medicare HMO | Admitting: Family Medicine

## 2020-04-28 DIAGNOSIS — M1711 Unilateral primary osteoarthritis, right knee: Secondary | ICD-10-CM

## 2020-04-28 NOTE — Assessment & Plan Note (Signed)
Symptoms likely related to underlying degenerative change.  Previous imaging has demonstrated this. -Counseled on home exercise therapy and supportive care. -Provided Pennsaid samples. -Pursue medial unloader brace due to thigh to calf ratio. -Could consider injections or physical therapy.

## 2020-04-28 NOTE — Progress Notes (Signed)
Christine [name] - 70 y.o. female MRN [id number]  Date of birth: March 05, 1951  SUBJECTIVE:  Including CC & ROS.  No chief complaint on file.   Christine [name] is a 70 y.o. female that is presenting with acute on chronic right knee pain.  The pain is occurring over the medial joint space.  Has been ongoing for about 3 weeks.  No inciting event or trauma.  No history of surgery.  No giving way or locking.   Review of Systems See HPI   HISTORY: Past Medical, Surgical, Social, and Family History Reviewed & Updated per EMR.   Pertinent Historical Findings include:  Past Medical History:  Diagnosis Date  . Anemia   . Anxiety   . Arthritis    "knees" (04/26/2016)  . Colon polyps    benign per pt  . Coronary artery disease    mild per 2015 cath in [place] (OM1 30%, RCA 30%)  . GERD (gastroesophageal reflux disease)   . Gout   . History of hiatal hernia   . Hypertension   . Migraine    "none since early /2017" (05/06/2016)  . Obesity   . OSA on CPAP    uses CPAP  . Pre-diabetes     Past Surgical History:  Procedure Laterality Date  . ABDOMINAL HYSTERECTOMY     "partial"; both ovaries present  . CARDIAC CATHETERIZATION  ~ 2015  . CARDIAC CATHETERIZATION  2015   In [place]  . CHILECTOMY Right 06/01/2017   Procedure: CHILECTOMY RIGHT FOOT;  Surgeon: Felecia Shelling, DPM;  Location: [facility] OR;  Service: Podiatry;  Laterality: Right;  . LAPAROSCOPIC APPENDECTOMY N/A 05/06/2016   Procedure: LAPAROSCOPIC APPENDECTOMY;  Surgeon: Manus Rudd, MD;  Location: [facility] OR;  Service: General;  Laterality: N/A;  . TONSILLECTOMY      Family History  Problem Relation Age of Onset  . Diabetes Mother   . Heart disease Mother   . High blood pressure Mother   . Asthma Mother   . Diabetes Father   . Heart disease Father   . High blood pressure Father   . Asthma Father   . Diabetes Sister   . Colon cancer Neg Hx   . Esophageal cancer Neg Hx   . Rectal cancer Neg Hx     Social History    Socioeconomic History  . Marital status: Single    Spouse name: Not on file  . Number of children: 0  . Years of education: Not on file  . Highest education level: Not on file  Occupational History  . Occupation: retired  Tobacco Use  . Smoking status: Former Smoker    Packs/day: 0.12    Years: 5.00    Pack years: 0.60    Types: Cigarettes    Quit date: 1980    Years since quitting: 42.0  . Smokeless tobacco: Never Used  Vaping Use  . Vaping Use: Never used  Substance and Sexual Activity  . Alcohol use: No  . Drug use: No  . Sexual activity: Never  Other Topics Concern  . Not on file  Social History Narrative  . Not on file   Social Determinants of Health   Financial Resource Strain: Not on file  Food Insecurity: Not on file  Transportation Needs: Not on file  Physical Activity: Not on file  Stress: Not on file  Social Connections: Not on file  Intimate Partner Violence: Not on file     PHYSICAL EXAM:  VS: Ht 5' 1.5" (1.562  m)   Wt (!) 301 lb (136.5 kg)   BMI 55.95 kg/m  Physical Exam Gen: NAD, alert, cooperative with exam, well-appearing MSK:  Right knee: No effusion Normal range of motion. Normal strength resistance. Instability with valgus varus stress testing. Neurovascularly intact     ASSESSMENT & PLAN:   OA (osteoarthritis) of knee Symptoms likely related to underlying degenerative change.  Previous imaging has demonstrated this. -Counseled on home exercise therapy and supportive care. -Provided Pennsaid samples. -Pursue medial unloader brace due to thigh to calf ratio. -Could consider injections or physical therapy.

## 2020-04-28 NOTE — Patient Instructions (Signed)
Good to see you Please try the exercises  Please use ice as needed  Please use tylenol  Please try the rub on medicine.  The [employer] representative will call you   Please send me a message in MyChart with any questions or updates.  Please see me back in 4 weeks.   --Dr. Jordan Likes

## 2020-04-29 NOTE — Progress Notes (Signed)
Medication Samples have been provided to the patient.  Drug name: pennsaid      Strength: 2%        Qty: 2 bxs  LOT:[id number] Exp.Date: 5/22  Dosing instructions: use a pea size amount and rub gently  The patient has been instructed regarding the correct time, dose, and frequency of taking this medication, including desired effects and most common side effects.   Lillia Pauls 9:16 AM 04/28/2020

## 2020-04-30 DIAGNOSIS — G4733 Obstructive sleep apnea (adult) (pediatric): Secondary | ICD-10-CM | POA: Diagnosis not present

## 2020-04-30 DIAGNOSIS — M793 Panniculitis, unspecified: Secondary | ICD-10-CM | POA: Insufficient documentation

## 2020-04-30 DIAGNOSIS — I8311 Varicose veins of right lower extremity with inflammation: Secondary | ICD-10-CM | POA: Insufficient documentation

## 2020-05-01 ENCOUNTER — Encounter: Payer: Self-pay | Admitting: Family Medicine

## 2020-05-12 ENCOUNTER — Ambulatory Visit: Payer: Medicare HMO | Admitting: Family Medicine

## 2020-05-19 ENCOUNTER — Ambulatory Visit (INDEPENDENT_AMBULATORY_CARE_PROVIDER_SITE_OTHER): Payer: Medicare HMO | Admitting: Podiatry

## 2020-05-19 ENCOUNTER — Other Ambulatory Visit: Payer: Self-pay

## 2020-05-19 ENCOUNTER — Ambulatory Visit (INDEPENDENT_AMBULATORY_CARE_PROVIDER_SITE_OTHER): Payer: Medicare HMO

## 2020-05-19 DIAGNOSIS — M79674 Pain in right toe(s): Secondary | ICD-10-CM

## 2020-05-19 DIAGNOSIS — M778 Other enthesopathies, not elsewhere classified: Secondary | ICD-10-CM

## 2020-05-19 DIAGNOSIS — L989 Disorder of the skin and subcutaneous tissue, unspecified: Secondary | ICD-10-CM

## 2020-05-19 DIAGNOSIS — B351 Tinea unguium: Secondary | ICD-10-CM | POA: Diagnosis not present

## 2020-05-19 DIAGNOSIS — M79671 Pain in right foot: Secondary | ICD-10-CM | POA: Diagnosis not present

## 2020-05-19 DIAGNOSIS — M79675 Pain in left toe(s): Secondary | ICD-10-CM

## 2020-05-20 ENCOUNTER — Ambulatory Visit (INDEPENDENT_AMBULATORY_CARE_PROVIDER_SITE_OTHER): Payer: Medicare HMO | Admitting: Family Medicine

## 2020-05-20 ENCOUNTER — Encounter: Payer: Self-pay | Admitting: Family Medicine

## 2020-05-20 VITALS — BP 128/82 | HR 86 | Temp 97.6°F | Ht 61.5 in | Wt 297.1 lb

## 2020-05-20 DIAGNOSIS — M545 Low back pain, unspecified: Secondary | ICD-10-CM

## 2020-05-20 DIAGNOSIS — R2689 Other abnormalities of gait and mobility: Secondary | ICD-10-CM

## 2020-05-20 DIAGNOSIS — F411 Generalized anxiety disorder: Secondary | ICD-10-CM | POA: Diagnosis not present

## 2020-05-20 DIAGNOSIS — R69 Illness, unspecified: Secondary | ICD-10-CM | POA: Diagnosis not present

## 2020-05-20 NOTE — Progress Notes (Signed)
Chief Complaint  Patient presents with  . Follow-up    Subjective Christine [name] presents for f/u anxiety.  Pt is currently being treated with Cymbalta 60 mg/d.  Reports doing much better since treatment. No adverse effects. No thoughts of harming self or others. No self-medication with alcohol, prescription drugs or illicit drugs. Pt is not following with a counselor/psychologist. Planning on seeing a counselor coming up though.   Over the past several months, the patient has been experiencing intermittent times where she will veer to the right when she is walking.  She does not have any issues when she drives.  No numbness, tingling, or back pain.  She has not fallen.  Past Medical History:  Diagnosis Date  . Anemia   . Anxiety   . Arthritis    "knees" (04/26/2016)  . Colon polyps    benign per pt  . Coronary artery disease    mild per 2015 cath in [place] (OM1 30%, RCA 30%)  . GERD (gastroesophageal reflux disease)   . Gout   . History of hiatal hernia   . Hypertension   . Migraine    "none since early /2017" (05/06/2016)  . Obesity   . OSA on CPAP    uses CPAP  . Pre-diabetes    Exam BP 128/82 (BP Location: Left Arm, Patient Position: Sitting, Cuff Size: Large)   Pulse 86   Temp 97.6 F (36.4 C) (Oral)   Ht 5' 1.5" (1.562 m)   Wt 297 lb 2 oz (134.8 kg)   SpO2 97%   BMI 55.23 kg/m  General:  well developed, well nourished, in no apparent distress Lungs:  No respiratory distress MSK: Bilateral tenderness to palpation in the lumbar paraspinal musculature and midline Neuro: DTRs equal and symmetric in the lower extremities, no cerebellar signs, no clonus, 4/5 strength with right knee flexion, 5/5 strength throughout in the lower extremities otherwise Psych: well oriented with normal range of affect and age-appropriate judgement/insight, alert and oriented x4.  Assessment and Plan  GAD (generalized anxiety disorder)  Balance problem  Acute bilateral low back  pain without sciatica  1. Improved. Cont Cymbalta 60 mg/d.  2/3.  I think this is musculoskeletal.  I gave her some stretches and exercises for her low back.  Could consider physical therapy in the next month if no improvement.  She will send a message if so needed. F/u in 6 mo for CPE.  The patient voiced understanding and agreement to the plan.  [place], DO 05/20/20 10:25 AM

## 2020-05-20 NOTE — Patient Instructions (Addendum)
I think it is an excellent idea to see a counselor.  Keep the diet clean and stay active.  Heat (pad or rice pillow in microwave) over affected area, 10-15 minutes twice daily.   OK to take Tylenol 1000 mg (2 extra strength tabs) or 975 mg (3 regular strength tabs) every 6 hours as needed.  Send me a message in 1 month if the balance issue is still bothersome.   Let us know if you need anything.  EXERCISES  RANGE OF MOTION (ROM) AND STRETCHING EXERCISES - Low Back Pain Most people with lower back pain will find that their symptoms get worse with excessive bending forward (flexion) or arching at the lower back (extension). The exercises that will help resolve your symptoms will focus on the opposite motion.  If you have pain, numbness or tingling which travels down into your buttocks, leg or foot, the goal of the therapy is for these symptoms to move closer to your back and eventually resolve. Sometimes, these leg symptoms will get better, but your lower back pain may worsen. This is often an indication of progress in your rehabilitation. Be very alert to any changes in your symptoms and the activities in which you participated in the 24 hours prior to the change. Sharing this information with your caregiver will allow him or her to most efficiently treat your condition. These exercises may help you when beginning to rehabilitate your injury. Your symptoms may resolve with or without further involvement from your physician, physical therapist or athletic trainer. While completing these exercises, remember:   Restoring tissue flexibility helps normal motion to return to the joints. This allows healthier, less painful movement and activity.  An effective stretch should be held for at least 30 seconds.  A stretch should never be painful. You should only feel a gentle lengthening or release in the stretched tissue. FLEXION RANGE OF MOTION AND STRETCHING EXERCISES:  STRETCH - Flexion, Single Knee  to Chest   Lie on a firm bed or floor with both legs extended in front of you.  Keeping one leg in contact with the floor, bring your opposite knee to your chest. Hold your leg in place by either grabbing behind your thigh or at your knee.  Pull until you feel a gentle stretch in your low back. Hold 30 seconds.  Slowly release your grasp and repeat the exercise with the opposite side. Repeat 2 times. Complete this exercise 3 times per week.   STRETCH - Flexion, Double Knee to Chest  Lie on a firm bed or floor with both legs extended in front of you.  Keeping one leg in contact with the floor, bring your opposite knee to your chest.  Tense your stomach muscles to support your back and then lift your other knee to your chest. Hold your legs in place by either grabbing behind your thighs or at your knees.  Pull both knees toward your chest until you feel a gentle stretch in your low back. Hold 30 seconds.  Tense your stomach muscles and slowly return one leg at a time to the floor. Repeat 2 times. Complete this exercise 3 times per week.   STRETCH - Low Trunk Rotation  Lie on a firm bed or floor. Keeping your legs in front of you, bend your knees so they are both pointed toward the ceiling and your feet are flat on the floor.  Extend your arms out to the side. This will stabilize your upper body by keeping  your shoulders in contact with the floor.  Gently and slowly drop both knees together to one side until you feel a gentle stretch in your low back. Hold for 30 seconds.  Tense your stomach muscles to support your lower back as you bring your knees back to the starting position. Repeat the exercise to the other side. Repeat 2 times. Complete this exercise at least 3 times per week.   EXTENSION RANGE OF MOTION AND FLEXIBILITY EXERCISES:  STRETCH - Extension, Prone on Elbows   Lie on your stomach on the floor, a bed will be too soft. Place your palms about shoulder width apart and  at the height of your head.  Place your elbows under your shoulders. If this is too painful, stack pillows under your chest.  Allow your body to relax so that your hips drop lower and make contact more completely with the floor.  Hold this position for 30 seconds.  Slowly return to lying flat on the floor. Repeat 2 times. Complete this exercise 3 times per week.   RANGE OF MOTION - Extension, Prone Press Ups  Lie on your stomach on the floor, a bed will be too soft. Place your palms about shoulder width apart and at the height of your head.  Keeping your back as relaxed as possible, slowly straighten your elbows while keeping your hips on the floor. You may adjust the placement of your hands to maximize your comfort. As you gain motion, your hands will come more underneath your shoulders.  Hold this position 30 seconds.  Slowly return to lying flat on the floor. Repeat 2 times. Complete this exercise 3 times per week.   RANGE OF MOTION- Quadruped, Neutral Spine   Assume a hands and knees position on a firm surface. Keep your hands under your shoulders and your knees under your hips. You may place padding under your knees for comfort.  Drop your head and point your tailbone toward the ground below you. This will round out your lower back like an angry cat. Hold this position for 30 seconds.  Slowly lift your head and release your tail bone so that your back sags into a large arch, like an old horse.  Hold this position for 30 seconds.  Repeat this until you feel limber in your low back.  Now, find your "sweet spot." This will be the most comfortable position somewhere between the two previous positions. This is your neutral spine. Once you have found this position, tense your stomach muscles to support your low back.  Hold this position for 30 seconds. Repeat 2 times. Complete this exercise 3 times per week.   STRENGTHENING EXERCISES - Low Back Sprain These exercises may help  you when beginning to rehabilitate your injury. These exercises should be done near your "sweet spot." This is the neutral, low-back arch, somewhere between fully rounded and fully arched, that is your least painful position. When performed in this safe range of motion, these exercises can be used for people who have either a flexion or extension based injury. These exercises may resolve your symptoms with or without further involvement from your physician, physical therapist or athletic trainer. While completing these exercises, remember:   Muscles can gain both the endurance and the strength needed for everyday activities through controlled exercises.  Complete these exercises as instructed by your physician, physical therapist or athletic trainer. Increase the resistance and repetitions only as guided.  You may experience muscle soreness or fatigue, but the  pain or discomfort you are trying to eliminate should never worsen during these exercises. If this pain does worsen, stop and make certain you are following the directions exactly. If the pain is still present after adjustments, discontinue the exercise until you can discuss the trouble with your caregiver.  STRENGTHENING - Deep Abdominals, Pelvic Tilt   Lie on a firm bed or floor. Keeping your legs in front of you, bend your knees so they are both pointed toward the ceiling and your feet are flat on the floor.  Tense your lower abdominal muscles to press your low back into the floor. This motion will rotate your pelvis so that your tail bone is scooping upwards rather than pointing at your feet or into the floor. With a gentle tension and even breathing, hold this position for 3 seconds. Repeat 2 times. Complete this exercise 3 times per week.   STRENGTHENING - Abdominals, Crunches   Lie on a firm bed or floor. Keeping your legs in front of you, bend your knees so they are both pointed toward the ceiling and your feet are flat on the floor.  Cross your arms over your chest.  Slightly tip your chin down without bending your neck.  Tense your abdominals and slowly lift your trunk high enough to just clear your shoulder blades. Lifting higher can put excessive stress on the lower back and does not further strengthen your abdominal muscles.  Control your return to the starting position. Repeat 2 times. Complete this exercise 3 times per week.   STRENGTHENING - Quadruped, Opposite UE/LE Lift   Assume a hands and knees position on a firm surface. Keep your hands under your shoulders and your knees under your hips. You may place padding under your knees for comfort.  Find your neutral spine and gently tense your abdominal muscles so that you can maintain this position. Your shoulders and hips should form a rectangle that is parallel with the floor and is not twisted.  Keeping your trunk steady, lift your right hand no higher than your shoulder and then your left leg no higher than your hip. Make sure you are not holding your breath. Hold this position for 30 seconds.  Continuing to keep your abdominal muscles tense and your back steady, slowly return to your starting position. Repeat with the opposite arm and leg. Repeat 2 times. Complete this exercise 3 times per week.   STRENGTHENING - Abdominals and Quadriceps, Straight Leg Raise   Lie on a firm bed or floor with both legs extended in front of you.  Keeping one leg in contact with the floor, bend the other knee so that your foot can rest flat on the floor.  Find your neutral spine, and tense your abdominal muscles to maintain your spinal position throughout the exercise.  Slowly lift your straight leg off the floor about 6 inches for a count of 3, making sure to not hold your breath.  Still keeping your neutral spine, slowly lower your leg all the way to the floor. Repeat this exercise with each leg 2 times. Complete this exercise 3 times per week.  POSTURE AND BODY  MECHANICS CONSIDERATIONS - Low Back Sprain Keeping correct posture when sitting, standing or completing your activities will reduce the stress put on different body tissues, allowing injured tissues a chance to heal and limiting painful experiences. The following are general guidelines for improved posture.  While reading these guidelines, remember:  The exercises prescribed by your provider will help  you have the flexibility and strength to maintain correct postures.  The correct posture provides the best environment for your joints to work. All of your joints have less wear and tear when properly supported by a spine with good posture. This means you will experience a healthier, less painful body.  Correct posture must be practiced with all of your activities, especially prolonged sitting and standing. Correct posture is as important when doing repetitive low-stress activities (typing) as it is when doing a single heavy-load activity (lifting).  RESTING POSITIONS Consider which positions are most painful for you when choosing a resting position. If you have pain with flexion-based activities (sitting, bending, stooping, squatting), choose a position that allows you to rest in a less flexed posture. You would want to avoid curling into a fetal position on your side. If your pain worsens with extension-based activities (prolonged standing, working overhead), avoid resting in an extended position such as sleeping on your stomach. Most people will find more comfort when they rest with their spine in a more neutral position, neither too rounded nor too arched. Lying on a non-sagging bed on your side with a pillow between your knees, or on your back with a pillow under your knees will often provide some relief. Keep in mind, being in any one position for a prolonged period of time, no matter how correct your posture, can still lead to stiffness.  PROPER SITTING POSTURE In order to minimize stress and  discomfort on your spine, you must sit with correct posture. Sitting with good posture should be effortless for a healthy body. Returning to good posture is a gradual process. Many people can work toward this most comfortably by using various supports until they have the flexibility and strength to maintain this posture on their own. When sitting with proper posture, your ears will fall over your shoulders and your shoulders will fall over your hips. You should use the back of the chair to support your upper back. Your lower back will be in a neutral position, just slightly arched. You may place a small pillow or folded towel at the base of your lower back for  support.  When working at a desk, create an environment that supports good, upright posture. Without extra support, muscles tire, which leads to excessive strain on joints and other tissues. Keep these recommendations in mind:  CHAIR:  A chair should be able to slide under your desk when your back makes contact with the back of the chair. This allows you to work closely.  The chair's height should allow your eyes to be level with the upper part of your monitor and your hands to be slightly lower than your elbows.  BODY POSITION  Your feet should make contact with the floor. If this is not possible, use a foot rest.  Keep your ears over your shoulders. This will reduce stress on your neck and low back.  INCORRECT SITTING POSTURES  If you are feeling tired and unable to assume a healthy sitting posture, do not slouch or slump. This puts excessive strain on your back tissues, causing more damage and pain. Healthier options include:  Using more support, like a lumbar pillow.  Switching tasks to something that requires you to be upright or walking.  Talking a brief walk.  Lying down to rest in a neutral-spine position.  PROLONGED STANDING WHILE SLIGHTLY LEANING FORWARD  When completing a task that requires you to lean forward while  standing in one place for  a long time, place either foot up on a stationary 2-4 inch high object to help maintain the best posture. When both feet are on the ground, the lower back tends to lose its slight inward curve. If this curve flattens (or becomes too large), then the back and your other joints will experience too much stress, tire more quickly, and can cause pain.  CORRECT STANDING POSTURES Proper standing posture should be assumed with all daily activities, even if they only take a few moments, like when brushing your teeth. As in sitting, your ears should fall over your shoulders and your shoulders should fall over your hips. You should keep a slight tension in your abdominal muscles to brace your spine. Your tailbone should point down to the ground, not behind your body, resulting in an over-extended swayback posture.   INCORRECT STANDING POSTURES  Common incorrect standing postures include a forward head, locked knees and/or an excessive swayback. WALKING Walk with an upright posture. Your ears, shoulders and hips should all line-up.  PROLONGED ACTIVITY IN A FLEXED POSITION When completing a task that requires you to bend forward at your waist or lean over a low surface, try to find a way to stabilize 3 out of 4 of your limbs. You can place a hand or elbow on your thigh or rest a knee on the surface you are reaching across. This will provide you more stability, so that your muscles do not tire as quickly. By keeping your knees relaxed, or slightly bent, you will also reduce stress across your lower back. CORRECT LIFTING TECHNIQUES  DO :  Assume a wide stance. This will provide you more stability and the opportunity to get as close as possible to the object which you are lifting.  Tense your abdominals to brace your spine. Bend at the knees and hips. Keeping your back locked in a neutral-spine position, lift using your leg muscles. Lift with your legs, keeping your back straight.  Test  the weight of unknown objects before attempting to lift them.  Try to keep your elbows locked down at your sides in order get the best strength from your shoulders when carrying an object.     Always ask for help when lifting heavy or awkward objects. INCORRECT LIFTING TECHNIQUES DO NOT:   Lock your knees when lifting, even if it is a small object.  Bend and twist. Pivot at your feet or move your feet when needing to change directions.  Assume that you can safely pick up even a paperclip without proper posture.

## 2020-05-22 DIAGNOSIS — G4733 Obstructive sleep apnea (adult) (pediatric): Secondary | ICD-10-CM | POA: Diagnosis not present

## 2020-05-27 ENCOUNTER — Other Ambulatory Visit: Payer: Self-pay

## 2020-05-27 ENCOUNTER — Ambulatory Visit: Payer: Medicare HMO | Admitting: Family Medicine

## 2020-05-27 VITALS — BP 144/82 | Ht 61.5 in | Wt 291.0 lb

## 2020-05-27 DIAGNOSIS — M1711 Unilateral primary osteoarthritis, right knee: Secondary | ICD-10-CM | POA: Diagnosis not present

## 2020-05-27 NOTE — Assessment & Plan Note (Signed)
Has had improvement of her symptoms but still gets achiness every so often -Counseled on home exercise therapy and supportive care. -Referral to physical therapy. -Could consider injection.

## 2020-05-27 NOTE — Progress Notes (Signed)
Subjective: Patient is a 70 y.o. female presenting to the office today for follow up evaluation of painful callus lesions noted to the bilateral feet. Walking and bearing weight increases the pain. She has not had any recent treatment. Patient also complains of elongated, thickened nails that cause pain while ambulating in shoes. She is unable to trim her own nails. Patient presents today for further treatment and evaluation. Patient does have a new complaint today regarding pain and tenderness to the right foot.  She states it has been present for a few weeks now.  She denies a history of injury but has been having increased pain and having difficulty walking without pain.  She has been taking Motrin 800 mg for the pain.  She presents for further treatment and evaluation  Past Medical History:  Diagnosis Date  . Anemia   . Anxiety   . Arthritis    "knees" (04/26/2016)  . Colon polyps    benign per pt  . Coronary artery disease    mild per 2015 cath in [place] (OM1 30%, RCA 30%)  . GERD (gastroesophageal reflux disease)   . Gout   . History of hiatal hernia   . Hypertension   . Migraine    "none since early /2017" (05/06/2016)  . Obesity   . OSA on CPAP    uses CPAP  . Pre-diabetes     Objective:  Physical Exam General: Alert and oriented x3 in no acute distress  Dermatology: Hyperkeratotic lesions present on the bilateral feet x 4. Pain on palpation with a central nucleated core noted. Skin is warm, dry and supple bilateral lower extremities. Negative for open lesions or macerations. Nails are tender, long, thickened and dystrophic with subungual debris, consistent with onychomycosis, 1-5 bilateral. No signs of infection noted.  Vascular: Palpable pedal pulses bilaterally. No edema or erythema noted. Capillary refill within normal limits.  Neurological: Epicritic and protective threshold grossly intact bilaterally.   Musculoskeletal Exam: Pain on palpation at the keratotic  lesions noted. Range of motion within normal limits bilateral. Muscle strength 5/5 in all groups bilateral.  There is some pain on palpation to the lateral aspect of the right midfoot  Radiographic exam: Joint spaces preserved.  Diffuse degenerative changes noted consistent with a generalized arthritis.  No fracture identified.  Good alignment otherwise normal exam.  Assessment: 1. Onychodystrophic nails 1-5 bilateral with hyperkeratosis of nails.  2. Onychomycosis of nail due to dermatophyte bilateral 3. Pre-ulcerative callus lesions noted to the bilateral feet x 4 4.  Capsulitis right foot   Plan of Care:  1. Patient evaluated. 2. Excisional debridement of keratoic lesion using a chisel blade was performed without incident.  3. Dressed with light dressing. 4. Mechanical debridement of nails 1-5 bilaterally performed using a nail nipper. Filed with dremel without incident.  5.  Continue OTC Motrin 800 mg as needed  6.  Recommend good supportive shoes that support the foot  7.  Patient is to return to the clinic in 3 months.   Edrick Kins, DPM [facility]  Dr. Edrick Kins, DPM    [address]  [address]                Office [phone]  Fax [phone]  [phone]     

## 2020-05-27 NOTE — Patient Instructions (Signed)
Good to see you Please continue the exercises  Physical therapy will give you a call.   Please send me a message in MyChart with any questions or updates.  Please see me back in 6 weeks.   --Dr. Raeford Razor

## 2020-05-27 NOTE — Progress Notes (Signed)
Christine [name] - 70 y.o. female MRN [id number]  Date of birth: 08/09/1950  SUBJECTIVE:  Including CC & ROS.  No chief complaint on file.   Christine [name] is a 70 y.o. female that is following up for her right knee pain.  Pain is improved but still gets mechanical symptoms every so often.   Review of Systems See HPI   HISTORY: Past Medical, Surgical, Social, and Family History Reviewed & Updated per EMR.   Pertinent Historical Findings include:  Past Medical History:  Diagnosis Date  . Anemia   . Anxiety   . Arthritis    "knees" (04/26/2016)  . Colon polyps    benign per pt  . Coronary artery disease    mild per 2015 cath in [place] (OM1 30%, RCA 30%)  . GERD (gastroesophageal reflux disease)   . Gout   . History of hiatal hernia   . Hypertension   . Migraine    "none since early /2017" (05/06/2016)  . Obesity   . OSA on CPAP    uses CPAP  . Pre-diabetes     Past Surgical History:  Procedure Laterality Date  . ABDOMINAL HYSTERECTOMY     "partial"; both ovaries present  . CARDIAC CATHETERIZATION  ~ 2015  . CARDIAC CATHETERIZATION  2015   In [place]  . CHILECTOMY Right 06/01/2017   Procedure: CHILECTOMY RIGHT FOOT;  Surgeon: Edrick Kins, DPM;  Location: [facility];  Service: Podiatry;  Laterality: Right;  . LAPAROSCOPIC APPENDECTOMY N/A 05/06/2016   Procedure: LAPAROSCOPIC APPENDECTOMY;  Surgeon: Donnie Mesa, MD;  Location: [facility] OR;  Service: General;  Laterality: N/A;  . TONSILLECTOMY      Family History  Problem Relation Age of Onset  . Diabetes Mother   . Heart disease Mother   . High blood pressure Mother   . Asthma Mother   . Diabetes Father   . Heart disease Father   . High blood pressure Father   . Asthma Father   . Diabetes Sister   . Colon cancer Neg Hx   . Esophageal cancer Neg Hx   . Rectal cancer Neg Hx     Social History   Socioeconomic History  . Marital status: Single    Spouse name: Not on file  . Number of children: 0  . Years of  education: Not on file  . Highest education level: Not on file  Occupational History  . Occupation: retired  Tobacco Use  . Smoking status: Former Smoker    Packs/day: 0.12    Years: 5.00    Pack years: 0.60    Types: Cigarettes    Quit date: 1980    Years since quitting: 42.1  . Smokeless tobacco: Never Used  Vaping Use  . Vaping Use: Never used  Substance and Sexual Activity  . Alcohol use: No  . Drug use: No  . Sexual activity: Never  Other Topics Concern  . Not on file  Social History Narrative  . Not on file   Social Determinants of Health   Financial Resource Strain: Not on file  Food Insecurity: Not on file  Transportation Needs: Not on file  Physical Activity: Not on file  Stress: Not on file  Social Connections: Not on file  Intimate Partner Violence: Not on file     PHYSICAL EXAM:  VS: BP (!) 144/82 (BP Location: Left Arm, Patient Position: Sitting, Cuff Size: Large)   Ht 5' 1.5" (1.562 m)   Wt 291 lb (132 kg)  BMI 54.09 kg/m  Physical Exam Gen: NAD, alert, cooperative with exam, well-appearing   ASSESSMENT & PLAN:   OA (osteoarthritis) of knee Has had improvement of her symptoms but still gets achiness every so often -Counseled on home exercise therapy and supportive care. -Referral to physical therapy. -Could consider injection.

## 2020-06-22 DIAGNOSIS — G4733 Obstructive sleep apnea (adult) (pediatric): Secondary | ICD-10-CM | POA: Diagnosis not present

## 2020-06-24 ENCOUNTER — Other Ambulatory Visit: Payer: Self-pay

## 2020-06-24 DIAGNOSIS — R600 Localized edema: Secondary | ICD-10-CM

## 2020-06-25 ENCOUNTER — Ambulatory Visit: Payer: Medicare HMO | Admitting: Physical Therapy

## 2020-07-03 ENCOUNTER — Ambulatory Visit: Payer: Medicare HMO | Admitting: Vascular Surgery

## 2020-07-03 ENCOUNTER — Ambulatory Visit (HOSPITAL_COMMUNITY): Payer: Medicare HMO

## 2020-07-08 ENCOUNTER — Ambulatory Visit: Payer: Medicare HMO | Admitting: Family Medicine

## 2020-07-09 ENCOUNTER — Ambulatory Visit: Payer: Medicare HMO | Attending: Family Medicine | Admitting: Rehabilitative and Restorative Service Providers"

## 2020-07-09 ENCOUNTER — Other Ambulatory Visit: Payer: Self-pay

## 2020-07-09 ENCOUNTER — Encounter: Payer: Self-pay | Admitting: Rehabilitative and Restorative Service Providers"

## 2020-07-09 DIAGNOSIS — G8929 Other chronic pain: Secondary | ICD-10-CM | POA: Diagnosis not present

## 2020-07-09 DIAGNOSIS — M6281 Muscle weakness (generalized): Secondary | ICD-10-CM | POA: Diagnosis not present

## 2020-07-09 DIAGNOSIS — M25561 Pain in right knee: Secondary | ICD-10-CM | POA: Insufficient documentation

## 2020-07-09 NOTE — Therapy (Signed)
[place], [place], [address] Phone: [phone]   Fax:  [phone]  Physical Therapy Evaluation  Patient Details  Name: Christine [name] MRN: [id number] Date of Birth: [birth year] Referring Provider (PT): Clearance Coots, MD   Encounter Date: 07/09/2020   PT End of Session - 07/09/20 1742    Visit Number 1    Number of Visits 12    Date for PT Re-Evaluation 08/20/20    Authorization Type [employer] [facility]    PT Start Time 0433    PT Stop Time 0529    PT Time Calculation (min) 56 min    Activity Tolerance Patient tolerated treatment well;No increased pain    Behavior During Therapy WFL for tasks assessed/performed           Past Medical History:  Diagnosis Date  . Anemia   . Anxiety   . Arthritis    "knees" (04/26/2016)  . Colon polyps    benign per pt  . Coronary artery disease    mild per 2015 cath in [place] (OM1 30%, RCA 30%)  . GERD (gastroesophageal reflux disease)   . Gout   . History of hiatal hernia   . Hypertension   . Migraine    "none since early /2017" (05/06/2016)  . Obesity   . OSA on CPAP    uses CPAP  . Pre-diabetes     Past Surgical History:  Procedure Laterality Date  . ABDOMINAL HYSTERECTOMY     "partial"; both ovaries present  . CARDIAC CATHETERIZATION  ~ 2015  . CARDIAC CATHETERIZATION  2015   In [place]  . CHILECTOMY Right 06/01/2017   Procedure: CHILECTOMY RIGHT FOOT;  Surgeon: Edrick Kins, DPM;  Location: [facility];  Service: Podiatry;  Laterality: Right;  . LAPAROSCOPIC APPENDECTOMY N/A 05/06/2016   Procedure: LAPAROSCOPIC APPENDECTOMY;  Surgeon: Donnie Mesa, MD;  Location: [facility];  Service: General;  Laterality: N/A;  . TONSILLECTOMY      There were no vitals filed for this visit.    Subjective Assessment - 07/09/20 1642    Subjective R knee is the one that is talking. I have a high pain threshold. Really began a couple months ago getting out of the car and  twisted my knee. I felt it immediately. It simmered down and then I realized it's not happy. I have 4 stairs at home and use the rail step to. I have lost 76 lbs. My balance isn't the best either.    Pertinent History see subjective; history of gout R foot; OA R knee; prediabetic    Limitations House hold activities;Sitting;Standing;Walking    How long can you stand comfortably? 10 min    How long can you walk comfortably? 15  min; can walk longer with shopping cart    Currently in Pain? Yes    Pain Score 1     Pain Location Knee    Pain Orientation Right    Pain Descriptors / Indicators Aching    Pain Type Chronic pain    Pain Radiating Towards stays localized to knee    Pain Onset More than a month ago    Pain Frequency Intermittent    Aggravating Factors  transfers, steps    Pain Relieving Factors a medicine she rubs on it occasionally but does not know the name; elevating legs with pillows under knees    Effect of Pain on Daily Activities pt is able to do all activities but with pain  Multiple Pain Sites No              [facility] PT Assessment - 07/09/20 0001      Assessment   Medical Diagnosis R knee OA    Referring Provider (PT) Clearance Coots, MD    Onset Date/Surgical Date 05/23/20    Hand Dominance Right    Next MD Visit 07/10/20    Prior Therapy n/a      Precautions   Precautions None      Restrictions   Weight Bearing Restrictions No      Balance Screen   Has the patient fallen in the past 6 months Yes   pt was too tired and didn't lift up leg completely to clear to curb   How many times? 1    Has the patient had a decrease in activity level because of a fear of falling?  No    Is the patient reluctant to leave their home because of a fear of falling?  No      Home [employer] residence      Prior Function   Level of Independence Independent      Cognition   Overall Cognitive Status Within Functional Limits for tasks assessed       Observation/Other Assessments   Focus on Therapeutic Outcomes (FOTO)  59% limited      Observation/Other Assessments-Edema    Edema --   hard edema present bil calf; pt reports she has compression hose but does not don     Coordination   Gross Motor Movements are Fluid and Coordinated Yes    Fine Motor Movements are Fluid and Coordinated Yes      Posture/Postural Control   Posture Comments overweight      ROM / Strength   AROM / PROM / Strength AROM;PROM;Strength      AROM   Overall AROM Comments bil knee AROM flex/ext WNL and =      PROM   Overall PROM Comments hip PROM all planes = and without pain      Strength   Overall Strength Comments L LE: 5/5 all except hip abdct/adduct 4+/5; R LE 4+/5 all except hip addct 2+/5; bil DF 4+/5; able to perform SLR bil without extension lag      Palpation   Palpation comment R patella mobility = compared to contralateral side; pt TTP R medial and superior patella; R medial and lateral joint line TTP; R ITB tender distally      Ambulation/Gait   Gait Comments bil hip ER, toes out more on R > L                      Objective measurements completed on examination: See above findings.               PT Education - 07/09/20 1741    Education Details HEP issued; discussed with pt importance of water therapy and discussed silver sneakers; pt to check with her [facility]. Pt reports not wearing compression hose for her legs; PT discussed benefit of her exploring with her MD thigh hose due to the calf compression hose being uncomfortable and pinching her/rolling down after wear which is why she does not wear them.    Person(s) Educated Patient    Methods Explanation;Demonstration;Handout    Comprehension Verbalized understanding;Returned demonstration            PT Short Term Goals - 07/09/20 1747  PT SHORT TERM GOAL #1   Title Pt will be I with initial HEP to assist with strengthening for improved function     Baseline issued at eval    Time 3    Period Weeks    Status New    Target Date 07/30/20      PT SHORT TERM GOAL #2   Title pt will report improvement in R knee pain x 25% to assist with improved sit to stand transfers    Baseline 0%    Time 3    Period Weeks    Status New    Target Date 07/30/20             PT Long Term Goals - 07/09/20 1748      PT LONG TERM GOAL #1   Title Pt will be able to use step stool for bed with R leg going first or other compensation with 75% less difficulty    Baseline difficult 100% of the time with R LE leading    Time 6    Period Weeks    Status New    Target Date 08/20/20      PT LONG TERM GOAL #2   Title Pt will be able to stand x 25 min to perform functional mobility with decreased R knee pain    Baseline 10 min    Time 6    Period Weeks    Status New    Target Date 08/20/20      PT LONG TERM GOAL #3   Title Pt will be able to walk x 25 min to perform recreational activity without a cart with improved R knee pain    Baseline 15 min with cart    Time 6    Period Weeks    Status New    Target Date 08/20/20      PT LONG TERM GOAL #4   Title pt will be indep with advanced HEP in prep for DC for continued maintenance of knee pain    Baseline initial HEP issued at eval    Time 6    Period Weeks    Status New    Target Date 08/20/20      PT LONG TERM GOAL #5   Title Pt will have improved foto score to 45% limited for improved impairment rating    Baseline 59% limited    Time 6    Period Weeks    Status New    Target Date 08/20/20                  Plan - 07/09/20 1719    Clinical Impression Statement Pt presents to PT with R knee OA. She demonstrates [facility] AROM. She is functionally limited with strength but per MMT strength is 4+/5 all except hip addct 2+/5 on R LE. Pt reports balance deficit but unable to perform during eval due to time. Pt with abnormal gait due to bil hip ER and increased toeing out on R side. Pt  with increased sway with gait. Patella mobility within functional limit; pt tender to palpation R medial and lateral joint line and tender medial and superior patella. Pt would benefit from PT for R LE strengthening, balance training as needed, and pain relief modalities to increase function and decrease falls risk.    Personal Factors and Comorbidities Comorbidity 1    Comorbidities bil knee OA    Examination-Activity Limitations Squat;Stairs;Locomotion Level;Stand;Transfers    Examination-Participation Restrictions Cleaning;Shop;Community Activity  Stability/Clinical Decision Making Stable/Uncomplicated    Clinical Decision Making Low    Rehab Potential Good    PT Frequency 2x / week    PT Duration 6 weeks    PT Treatment/Interventions Cryotherapy;Electrical Stimulation;Moist Heat;Ultrasound;Functional mobility training;Stair training;Gait training;Therapeutic activities;Therapeutic exercise;Balance training;Neuromuscular re-education;Patient/family education;Manual techniques;Taping    PT Next Visit Plan Review HEP; SAQ, pain free knee strengthening; ask her if she contacted the [facility]; ask her how her MD visit went and what he thought about thigh compression hose    PT Home Exercise Plan Access Code: [email or web address]  URL: [email or web address]  Date: 07/09/2020  Prepared by: Myra Rude    Exercises  Supine Quad Set - 1 x daily - 4 x weekly - 1 sets - 10 reps  Supine Heel Slide - 1 x daily - 4 x weekly - 1 sets - 10 reps  Supine Ankle Pumps - 1 x daily - 4 x weekly - 1 sets - 10 reps    Consulted and Agree with Plan of Care Patient           Patient will benefit from skilled therapeutic intervention in order to improve the following deficits and impairments:  Abnormal gait,Decreased balance,Decreased endurance,Decreased mobility,Difficulty walking,Decreased activity tolerance,Decreased strength,Pain  Visit Diagnosis: Chronic pain of right knee  Muscle weakness  (generalized)     Problem List Patient Active Problem List   Diagnosis Date Noted  . 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  . Globus sensation 11/15/2019  . Laryngopharyngeal reflux (LPR) 11/15/2019  . Throat clearing 10/05/2019  . Gout 08/31/2019  . Phlebitis 08/31/2019  . Prediabetes 08/31/2019  . OA (osteoarthritis) of knee 05/17/2019  . Acute pain of right knee 05/17/2019  . Abdominal pain 03/31/2019  . Morbid obesity ([place]) 03/29/2018  . GAD (generalized anxiety disorder) 02/27/2018  . Mixed incontinence urge and stress 02/27/2018  . OSA on CPAP 06/16/2017  . Acute appendicitis 05/06/2016  . Gastro-esophageal reflux disease with esophagitis 07/26/2011  . Essential hypertension 07/26/2011  . Osteoarthrosis 10/16/2009  . 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 Pre-operative cardiovascular examination 08/22/2008  . Anxiety 04/27/2003    Myra Rude, PT 07/09/2020, 5:55 PM  [facility] [address] [place], [place], [address] Phone: [phone]   Fax:  [phone]  Name: Christine [name] MRN: [id number] Date of Birth: 1951-02-26

## 2020-07-10 ENCOUNTER — Encounter: Payer: Self-pay | Admitting: Vascular Surgery

## 2020-07-10 ENCOUNTER — Telehealth (INDEPENDENT_AMBULATORY_CARE_PROVIDER_SITE_OTHER): Payer: Medicare HMO | Admitting: Family Medicine

## 2020-07-10 ENCOUNTER — Ambulatory Visit (INDEPENDENT_AMBULATORY_CARE_PROVIDER_SITE_OTHER): Payer: Medicare HMO | Admitting: Vascular Surgery

## 2020-07-10 ENCOUNTER — Ambulatory Visit (HOSPITAL_COMMUNITY)
Admission: RE | Admit: 2020-07-10 | Discharge: 2020-07-10 | Disposition: A | Discharge status: Home / Self Care | Payer: Medicare HMO | Source: Ambulatory Visit | Attending: Vascular Surgery | Admitting: Vascular Surgery

## 2020-07-10 VITALS — BP 135/70 | HR 94 | Temp 98.3°F | Resp 20 | Ht 61.5 in | Wt 288.0 lb

## 2020-07-10 DIAGNOSIS — R600 Localized edema: Secondary | ICD-10-CM

## 2020-07-10 DIAGNOSIS — M1711 Unilateral primary osteoarthritis, right knee: Secondary | ICD-10-CM

## 2020-07-10 NOTE — Assessment & Plan Note (Signed)
Still having ongoing pain and seems related to degenerative aspect. -Counseled on home exercise therapy and supportive care. -Continue physical therapy. -Could consider injection.

## 2020-07-10 NOTE — Progress Notes (Signed)
Patient is a 70 year old female who returns for follow-up today.  She was last seen in March 2021.  At that time she complained of bilateral leg swelling.  This was chronic in nature.  Other medical problems include coronary artery disease hypertension migraine sleep apnea and obesity.  All of these are stable.  She has had some intermittent cellulitis in the past but currently has no symptoms of this.  She is contemplating having a gastric balloon procedure for weight loss.  She has lost 15 pounds in the last year.  We discussed her exercise and diet more today in weight loss.  Past Medical History:  Diagnosis Date  . Anemia   . Anxiety   . Arthritis    "knees" (04/26/2016)  . Colon polyps    benign per pt  . Coronary artery disease    mild per 2015 cath in [place] (OM1 30%, RCA 30%)  . GERD (gastroesophageal reflux disease)   . Gout   . History of hiatal hernia   . Hypertension   . Migraine    "none since early /2017" (05/06/2016)  . Obesity   . OSA on CPAP    uses CPAP  . Pre-diabetes     Past Surgical History:  Procedure Laterality Date  . ABDOMINAL HYSTERECTOMY     "partial"; both ovaries present  . CARDIAC CATHETERIZATION  ~ 2015  . CARDIAC CATHETERIZATION  2015   In [place]  . CHILECTOMY Right 06/01/2017   Procedure: CHILECTOMY RIGHT FOOT;  Surgeon: Edrick Kins, DPM;  Location: [facility];  Service: Podiatry;  Laterality: Right;  . LAPAROSCOPIC APPENDECTOMY N/A 05/06/2016   Procedure: LAPAROSCOPIC APPENDECTOMY;  Surgeon: Donnie Mesa, MD;  Location[facility];  Service: General;  Laterality: N/A;  . TONSILLECTOMY     Current Outpatient Medications on File Prior to Visit  Medication Sig Dispense Refill  . acetaminophen (TYLENOL) 325 MG tablet Take 2 tablets (650 mg total) by mouth every 6 (six) hours as needed. 30 tablet 0  . aspirin 81 MG EC tablet Take by mouth.    . DULoxetine (CYMBALTA) 60 MG capsule Take 1 capsule (60 mg total) by mouth daily. 30 capsule 3  . esomeprazole  (NEXIUM) 40 MG capsule Take 1 capsule (40 mg total) by mouth daily. 90 capsule 1  . fexofenadine (ALLEGRA) 180 MG tablet Take 180 mg by mouth daily.    Marland Kitchen ibuprofen (ADVIL) 800 MG tablet Take 1 tablet (800 mg total) by mouth every 8 (eight) hours as needed. 90 tablet 0  . iron polysaccharides (NIFEREX) 150 MG capsule Take 150 mg by mouth daily.    . metoprolol tartrate (LOPRESSOR) 50 MG tablet TAKE 1 TABLET BY MOUTH EVERY DAY 90 tablet 1  . oxybutynin (DITROPAN XL) 15 MG 24 hr tablet Take 1 tablet (15 mg total) by mouth at bedtime. 30 tablet 4  . triamterene-hydrochlorothiazide (MAXZIDE) 75-50 MG tablet Take 1 tablet by mouth daily. 30 tablet 8   No current facility-administered medications on file prior to visit.    Physical exam:  Vitals:   07/10/20 1542  BP: 135/70  Pulse: 94  Resp: 20  Temp: 98.3 F (36.8 C)  SpO2: 97%  Weight: 288 lb (130.6 kg)  Height: 5' 1.5" (1.562 m)    Extremities: 2+ dorsalis pedis pulses bilaterally Data: Patient had bilateral ABIs performed today which I reviewed and interpreted.  Right side was 0.98 left side was 1.00 toe pressure was 121 on the left 145 on the right.  This was a normal study.   Assessment: No significant evidence of arterial occlusive disease.  Patient has known venous reflux but obesity is her primary problem at this point.  Plan: Patient will continue to wear her lower extremity compression garments.  She will try to continue her weight loss.  She will follow up with [place] on an as-needed basis.  Ruta Hinds, MD [facility] Office: [phone]

## 2020-07-10 NOTE — Progress Notes (Signed)
Virtual Visit via Video Note  I connected with Christine [name] on 07/10/20 at 10:10 AM EDT by a video enabled telemedicine application and verified that I am speaking with the correct person using two identifiers.  Location: Patient: home Provider: office   I discussed the limitations of evaluation and management by telemedicine and the availability of in person appointments. The patient expressed understanding and agreed to proceed.  History of Present Illness:  [name] is a 70 year old female that is following up for her knee pain.  She has been through some physical therapy.  Has been through physical therapy.  Pain is still bothering her when she gets up and transition to different maneuvers.   Observations/Objective:  Gen: NAD, alert, cooperative with exam, well-appearing  Assessment and Plan:  Osteoarthritis of right knee: Still having ongoing pain and seems related to degenerative aspect. -Counseled on home exercise therapy and supportive care. -Continue physical therapy. -Could consider injection.  Follow Up Instructions:    I discussed the assessment and treatment plan with the patient. The patient was provided an opportunity to ask questions and all were answered. The patient agreed with the plan and demonstrated an understanding of the instructions.   The patient was advised to call back or seek an in-person evaluation if the symptoms worsen or if the condition fails to improve as anticipated.   Clearance Coots, MD

## 2020-07-17 ENCOUNTER — Encounter: Payer: Self-pay | Admitting: Physical Therapy

## 2020-07-17 ENCOUNTER — Ambulatory Visit: Payer: Medicare HMO | Admitting: Physical Therapy

## 2020-07-17 ENCOUNTER — Other Ambulatory Visit: Payer: Self-pay

## 2020-07-17 DIAGNOSIS — M6281 Muscle weakness (generalized): Secondary | ICD-10-CM

## 2020-07-17 DIAGNOSIS — M25561 Pain in right knee: Secondary | ICD-10-CM | POA: Diagnosis not present

## 2020-07-17 DIAGNOSIS — G8929 Other chronic pain: Secondary | ICD-10-CM | POA: Diagnosis not present

## 2020-07-17 NOTE — Therapy (Signed)
Christine [name], [place], [address] Phone: [phone]   Fax:  [phone]  Physical Therapy Treatment  Patient Details  Name: Christine [name] MRN: [id number] Date of Birth: [birth year] Referring Provider (PT): Clearance Coots, MD   Encounter Date: 07/17/2020   PT End of Session - 07/17/20 1515    Visit Number 2    Number of Visits 12    Date for PT Re-Evaluation 08/20/20    Authorization Type Aetna MCR    PT Start Time 1311   pt. arrived late   PT Stop Time 1353    PT Time Calculation (min) 42 min    Activity Tolerance Patient limited by fatigue    Behavior During Therapy [facility] for tasks assessed/performed           Past Medical History:  Diagnosis Date  . Anemia   . Anxiety   . Arthritis    "knees" (04/26/2016)  . Colon polyps    benign per pt  . Coronary artery disease    mild per 2015 cath in [place] (OM1 30%, RCA 30%)  . GERD (gastroesophageal reflux disease)   . Gout   . History of hiatal hernia   . Hypertension   . Migraine    "none since early /2017" (05/06/2016)  . Obesity   . OSA on CPAP    uses CPAP  . Pre-diabetes     Past Surgical History:  Procedure Laterality Date  . ABDOMINAL HYSTERECTOMY     "partial"; both ovaries present  . CARDIAC CATHETERIZATION  ~ 2015  . CARDIAC CATHETERIZATION  2015   In [place]  . CHILECTOMY Right 06/01/2017   Procedure: CHILECTOMY RIGHT FOOT;  Surgeon: Edrick Kins, DPM;  Location: [place];  Service: Podiatry;  Laterality: Right;  . LAPAROSCOPIC APPENDECTOMY N/A 05/06/2016   Procedure: LAPAROSCOPIC APPENDECTOMY;  Surgeon: Donnie Mesa, MD;  Location: [place];  Service: General;  Laterality: N/A;  . TONSILLECTOMY      There were no vitals filed for this visit.   Subjective Assessment - 07/17/20 1516    Subjective Right knee pain 1/10 pre-tx. Pt. had MD follow up (video visit) but reports did not discuss compression stockings. She has had some issues  with comfort with the stockings (below knee) that she has but wants some that go above knee-she plans on requesting presciption from MD for new pair. Still considering options for return to gym at [facility] vs. senior center.    Pertinent History see subjective; history of gout R foot; OA R knee; prediabetic    Currently in Pain? Yes    Pain Score 1     Pain Location Knee    Pain Orientation Right    Pain Descriptors / Indicators Aching    Pain Type Chronic pain    Pain Onset More than a month ago    Pain Frequency Intermittent    Aggravating Factors  steps, transfers    Pain Relieving Factors rest/elevation, topical medication                             [facility] Adult PT Treatment/Exercise - 07/17/20 0001      Exercises   Exercises Knee/Hip      Knee/Hip Exercises: Aerobic   Nustep L3 x 5 min LE only      Knee/Hip Exercises: Standing   Heel Raises Both;2 sets;10 reps    Heel Raises Limitations Airex  Terminal Knee Extension AROM;Strengthening;Right;2 sets;10 reps    Theraband Level (Terminal Knee Extension) Level 4 (Blue)    Hip Abduction AROM;Stengthening;Right;Left;15 reps;Knee straight    Abduction Limitations 3 lb. ankle weights    Forward Step Up Right;Hand Hold: 2;Step Height: 4";15 reps    Functional Squat 15 reps    Functional Squat Limitations partial squat at counter      Knee/Hip Exercises: Seated   Long Arc Quad AROM;Strengthening;Right;2 sets;10 reps    Long Arc Quad Weight 3 lbs.    Hamstring Curl AROM;Strengthening;Right;2 sets;10 reps    Hamstring Limitations blue      Knee/Hip Exercises: Supine   Short Arc Quad Sets AROM;Strengthening;Right;2 sets;10 reps    Short Arc Quad Sets Limitations 2 lbs.    Patellar Mobs grade I-II right patella medial and superior/inferior glides                  PT Education - 07/17/20 1557    Education Details exercises, POC, delayed onset muscle soreness vs. OA pain exacerbation    Person(s) Educated  Patient    Methods Explanation;Demonstration;Verbal cues    Comprehension Verbalized understanding;Returned demonstration            PT Short Term Goals - 07/09/20 1747      PT SHORT TERM GOAL #1   Title Pt will be I with initial HEP to assist with strengthening for improved function    Baseline issued at eval    Time 3    Period Weeks    Status New    Target Date 07/30/20      PT SHORT TERM GOAL #2   Title pt will report improvement in R knee pain x 25% to assist with improved sit to stand transfers    Baseline 0%    Time 3    Period Weeks    Status New    Target Date 07/30/20             PT Long Term Goals - 07/09/20 1748      PT LONG TERM GOAL #1   Title Pt will be able to use step stool for bed with R leg going first or other compensation with 75% less difficulty    Baseline difficult 100% of the time with R LE leading    Time 6    Period Weeks    Status New    Target Date 08/20/20      PT LONG TERM GOAL #2   Title Pt will be able to stand x 25 min to perform functional mobility with decreased R knee pain    Baseline 10 min    Time 6    Period Weeks    Status New    Target Date 08/20/20      PT LONG TERM GOAL #3   Title Pt will be able to walk x 25 min to perform recreational activity without a cart with improved R knee pain    Baseline 15 min with cart    Time 6    Period Weeks    Status New    Target Date 08/20/20      PT LONG TERM GOAL #4   Title pt will be indep with advanced HEP in prep for DC for continued maintenance of knee pain    Baseline initial HEP issued at eval    Time 6    Period Weeks    Status New    Target Date 08/20/20  PT LONG TERM GOAL #5   Title Pt will have improved foto score to 45% limited for improved impairment rating    Baseline 59% limited    Time 6    Period Weeks    Status New    Target Date 08/20/20                 Plan - 07/17/20 1557    Clinical Impression Statement At first follow up tx.  session today focused on right knee and hip strengthening with open and closed chain exercises. Mild pain with step ups and hamstring curls but overall session well-tolerated in terms of pain though with fatigue as limiting factor for activity tolerance with rest breaks required between exercises. Expect progress/results will be gradual for therapy given symptom etiology but plan continue PT for further work on strengthening to help improve pain and mobility tolerance.    Personal Factors and Comorbidities Comorbidity 1    Comorbidities bil knee OA    Examination-Activity Limitations Squat;Stairs;Locomotion Level;Stand;Transfers    Examination-Participation Restrictions Cleaning;Shop;Community Activity    Stability/Clinical Decision Making Stable/Uncomplicated    Clinical Decision Making Low    Rehab Potential Good    PT Frequency 2x / week    PT Duration 6 weeks    PT Treatment/Interventions Cryotherapy;Electrical Stimulation;Moist Heat;Ultrasound;Functional mobility training;Stair training;Gait training;Therapeutic activities;Therapeutic exercise;Balance training;Neuromuscular re-education;Patient/family education;Manual techniques;Taping    PT Next Visit Plan Continue/progress knee and hip strengthening with open and closed chain exercises as tolerated, update HEP prn    PT Home Exercise Plan Access Code: [email or web address]  URL: [email or web address]  Date: 07/09/2020  Prepared by: Myra Rude    Exercises  Supine Quad Set - 1 x daily - 4 x weekly - 1 sets - 10 reps  Supine Heel Slide - 1 x daily - 4 x weekly - 1 sets - 10 reps  Supine Ankle Pumps - 1 x daily - 4 x weekly - 1 sets - 10 reps    Consulted and Agree with Plan of Care Patient           Patient will benefit from skilled therapeutic intervention in order to improve the following deficits and impairments:  Abnormal gait,Decreased balance,Decreased endurance,Decreased mobility,Difficulty walking,Decreased activity  tolerance,Decreased strength,Pain  Visit Diagnosis: Chronic pain of right knee  Muscle weakness (generalized)     Problem List Patient Active Problem List   Diagnosis Date Noted  . 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  . Globus sensation 11/15/2019  . Laryngopharyngeal reflux (LPR) 11/15/2019  . Throat clearing 10/05/2019  . Gout 08/31/2019  . Phlebitis 08/31/2019  . Prediabetes 08/31/2019  . OA (osteoarthritis) of knee 05/17/2019  . Acute pain of right knee 05/17/2019  . Abdominal pain 03/31/2019  . Morbid obesity ([place]) 03/29/2018  . GAD (generalized anxiety disorder) 02/27/2018  . Mixed incontinence urge and stress 02/27/2018  . OSA on CPAP 06/16/2017  . Acute appendicitis 05/06/2016  . Gastro-esophageal reflux disease with esophagitis 07/26/2011  . Essential hypertension 07/26/2011  . Osteoarthrosis 10/16/2009  . 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 Pre-operative cardiovascular examination 08/22/2008  . Anxiety 04/27/2003    Beaulah Dinning, PT, DPT 07/17/20 4:02 PM  [facility] [address] [place], [place], [address] Phone: [phone]   Fax:  [phone]  Name: Suprina Mandeville MRN: [id number] Date of Birth: 26-Jun-1950

## 2020-07-21 DIAGNOSIS — I10 Essential (primary) hypertension: Secondary | ICD-10-CM | POA: Diagnosis not present

## 2020-07-21 DIAGNOSIS — M1990 Unspecified osteoarthritis, unspecified site: Secondary | ICD-10-CM | POA: Diagnosis not present

## 2020-07-21 DIAGNOSIS — N3281 Overactive bladder: Secondary | ICD-10-CM | POA: Diagnosis not present

## 2020-07-21 DIAGNOSIS — K219 Gastro-esophageal reflux disease without esophagitis: Secondary | ICD-10-CM | POA: Diagnosis not present

## 2020-07-21 DIAGNOSIS — R69 Illness, unspecified: Secondary | ICD-10-CM | POA: Diagnosis not present

## 2020-07-21 DIAGNOSIS — G4733 Obstructive sleep apnea (adult) (pediatric): Secondary | ICD-10-CM | POA: Diagnosis not present

## 2020-07-21 DIAGNOSIS — M545 Low back pain, unspecified: Secondary | ICD-10-CM | POA: Diagnosis not present

## 2020-07-21 DIAGNOSIS — D649 Anemia, unspecified: Secondary | ICD-10-CM | POA: Diagnosis not present

## 2020-07-21 DIAGNOSIS — J309 Allergic rhinitis, unspecified: Secondary | ICD-10-CM | POA: Diagnosis not present

## 2020-07-21 DIAGNOSIS — Z6841 Body Mass Index (BMI) 40.0 and over, adult: Secondary | ICD-10-CM | POA: Diagnosis not present

## 2020-07-23 ENCOUNTER — Encounter: Payer: Self-pay | Admitting: Physical Therapy

## 2020-07-23 ENCOUNTER — Other Ambulatory Visit: Payer: Self-pay

## 2020-07-23 ENCOUNTER — Ambulatory Visit: Payer: Medicare HMO | Admitting: Physical Therapy

## 2020-07-23 VITALS — BP 155/88 | HR 63

## 2020-07-23 DIAGNOSIS — G8929 Other chronic pain: Secondary | ICD-10-CM

## 2020-07-23 DIAGNOSIS — M6281 Muscle weakness (generalized): Secondary | ICD-10-CM

## 2020-07-23 DIAGNOSIS — M25561 Pain in right knee: Secondary | ICD-10-CM | POA: Diagnosis not present

## 2020-07-23 MED ORDER — IBUPROFEN 800 MG PO TABS: 800.0000 mg | ORAL_TABLET | Freq: Three times a day (TID) | ORAL | 0 refills | Status: DC | PRN

## 2020-07-23 NOTE — Therapy (Signed)
[place], [place], [address] Phone: [phone]   Fax:  [phone]  Physical Therapy Treatment  Patient Details  Name: Christine [name] MRN: [id number] Date of Birth: January 27, 1951 Referring Provider (PT): Clearance Coots, MD   Encounter Date: 07/23/2020   PT End of Session - 07/23/20 1233    Visit Number 3    Number of Visits 12    Date for PT Re-Evaluation 08/20/20    Authorization Type Aetna MCR    PT Start Time 2694    PT Stop Time 1228    PT Time Calculation (min) 43 min    Activity Tolerance Patient limited by fatigue    Behavior During Therapy [facility] for tasks assessed/performed           Past Medical History:  Diagnosis Date  . Anemia   . Anxiety   . Arthritis    "knees" (04/26/2016)  . Colon polyps    benign per pt  . Coronary artery disease    mild per 2015 cath in [place] (OM1 30%, RCA 30%)  . GERD (gastroesophageal reflux disease)   . Gout   . History of hiatal hernia   . Hypertension   . Migraine    "none since early /2017" (05/06/2016)  . Obesity   . OSA on CPAP    uses CPAP  . Pre-diabetes     Past Surgical History:  Procedure Laterality Date  . ABDOMINAL HYSTERECTOMY     "partial"; both ovaries present  . CARDIAC CATHETERIZATION  ~ 2015  . CARDIAC CATHETERIZATION  2015   In [place]  . CHILECTOMY Right 06/01/2017   Procedure: CHILECTOMY RIGHT FOOT;  Surgeon: Edrick Kins, DPM;  Location: [place];  Service: Podiatry;  Laterality: Right;  . LAPAROSCOPIC APPENDECTOMY N/A 05/06/2016   Procedure: LAPAROSCOPIC APPENDECTOMY;  Surgeon: Donnie Mesa, MD;  Location: [place];  Service: General;  Laterality: N/A;  . TONSILLECTOMY      Vitals:   07/23/20 1155  BP: (!) 155/88  Pulse: 63  SpO2: 98%     Subjective Assessment - 07/23/20 1149    Subjective No pain pre-tx. and no significant soreness after last session. Pt. does note that after session last week she had some dizziness  and checked her BP after session-reports it was 147/80 and was concerned dizziness associated with BP. No dizziness noted today. See assessment/plan. Still no compression stockings at this point for LE edema.    Pertinent History see subjective; history of gout R foot; OA R knee; prediabetic    Currently in Pain? No/denies                             [facility] Adult PT Treatment/Exercise - 07/23/20 0001      Knee/Hip Exercises: Aerobic   Nustep L5 x 5 min LE only      Knee/Hip Exercises: Standing   Hip Abduction AROM;Stengthening;Right;Left;2 sets;10 reps;Knee straight    Abduction Limitations 3 lb. ankle weights    Functional Squat 20 reps    Functional Squat Limitations 2x10 squat at counter      Knee/Hip Exercises: Seated   Long Arc Quad AROM;Strengthening;Right;2 sets;10 reps    Long Arc Quad Weight 4 lbs.    Hamstring Curl AROM;Strengthening;Right;2 sets;10 reps    Hamstring Limitations blue      Knee/Hip Exercises: Supine   Short Arc Quad Sets AROM;Strengthening;Right;2 sets;10 reps    Short [employer]  Sets Limitations 3 lbs.    Bridges AROM;Strengthening;Both;10 reps    Other Supine Knee/Hip Exercises clamshell blue band 2x10                  PT Education - 07/23/20 1236    Education Details HEP updates, monitor BP and contact MD if remaining elevated or experiencing any further issues with dizziness symptoms    Person(s) Educated Patient    Methods Explanation;Demonstration;Verbal cues;Handout    Comprehension Returned demonstration;Verbalized understanding            PT Short Term Goals - 07/09/20 1747      PT SHORT TERM GOAL #1   Title Pt will be I with initial HEP to assist with strengthening for improved function    Baseline issued at eval    Time 3    Period Weeks    Status New    Target Date 07/30/20      PT SHORT TERM GOAL #2   Title pt will report improvement in R knee pain x 25% to assist with improved sit to stand transfers     Baseline 0%    Time 3    Period Weeks    Status New    Target Date 07/30/20             PT Long Term Goals - 07/09/20 1748      PT LONG TERM GOAL #1   Title Pt will be able to use step stool for bed with R leg going first or other compensation with 75% less difficulty    Baseline difficult 100% of the time with R LE leading    Time 6    Period Weeks    Status New    Target Date 08/20/20      PT LONG TERM GOAL #2   Title Pt will be able to stand x 25 min to perform functional mobility with decreased R knee pain    Baseline 10 min    Time 6    Period Weeks    Status New    Target Date 08/20/20      PT LONG TERM GOAL #3   Title Pt will be able to walk x 25 min to perform recreational activity without a cart with improved R knee pain    Baseline 15 min with cart    Time 6    Period Weeks    Status New    Target Date 08/20/20      PT LONG TERM GOAL #4   Title pt will be indep with advanced HEP in prep for DC for continued maintenance of knee pain    Baseline initial HEP issued at eval    Time 6    Period Weeks    Status New    Target Date 08/20/20      PT LONG TERM GOAL #5   Title Pt will have improved foto score to 45% limited for improved impairment rating    Baseline 59% limited    Time 6    Period Weeks    Status New    Target Date 08/20/20                 Plan - 07/23/20 1156    Clinical Impression Statement Checked BP pre-tx. with (elevated) values as noted in subjective. Pt. instructed to continue to monitor at home and if remaining elevated to contact her PCP for further follow up and instructions to address. Rechecked post-tx.  with BP 152/84 and no dizziness symptoms. For exercises fatigue still limiting factor but able to tolerate knee and hip strengthening progression as noted per flowsheet without significant pain.    Personal Factors and Comorbidities Comorbidity 1    Comorbidities bil knee OA    Examination-Activity Limitations  Squat;Stairs;Locomotion Level;Stand;Transfers    Examination-Participation Restrictions Cleaning;Shop;Community Activity    Stability/Clinical Decision Making Stable/Uncomplicated    Clinical Decision Making Low    Rehab Potential Good    PT Frequency 2x / week    PT Duration 6 weeks    PT Treatment/Interventions Cryotherapy;Electrical Stimulation;Moist Heat;Ultrasound;Functional mobility training;Stair training;Gait training;Therapeutic activities;Therapeutic exercise;Balance training;Neuromuscular re-education;Patient/family education;Manual techniques;Taping    PT Next Visit Plan Monitor BP as needed, Continue/progress knee and hip strengthening with open and closed chain exercises as tolerated, update HEP prn    PT Home Exercise Plan Access Code: [email or web address]  URL: [email or web address]  Date: 07/09/2020  Prepared by: Myra Rude    Exercises  Supine Quad Set - 1 x daily - 4 x weekly - 1 sets - 10 reps  Supine Heel Slide - 1 x daily - 4 x weekly - 1 sets - 10 reps  Supine Ankle Pumps - 1 x daily - 4 x weekly - 1 sets - 10 reps, partial squats at counter 2x10, seated hip abduction with band 2-3x10, heel raises at counter 2-3 x 10    Consulted and Agree with Plan of Care Patient           Patient will benefit from skilled therapeutic intervention in order to improve the following deficits and impairments:  Abnormal gait,Decreased balance,Decreased endurance,Decreased mobility,Difficulty walking,Decreased activity tolerance,Decreased strength,Pain  Visit Diagnosis: Chronic pain of right knee  Muscle weakness (generalized)     Problem List Patient Active Problem List   Diagnosis Date Noted  . 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  . Globus sensation 11/15/2019  . Laryngopharyngeal reflux (LPR) 11/15/2019  . Throat clearing 10/05/2019  . Gout 08/31/2019  . Phlebitis 08/31/2019  . Prediabetes 08/31/2019  . OA (osteoarthritis) of knee 05/17/2019  .  Acute pain of right knee 05/17/2019  . Abdominal pain 03/31/2019  . Morbid obesity ([place]) 03/29/2018  . GAD (generalized anxiety disorder) 02/27/2018  . Mixed incontinence urge and stress 02/27/2018  . OSA on CPAP 06/16/2017  . Acute appendicitis 05/06/2016  . Gastro-esophageal reflux disease with esophagitis 07/26/2011  . Essential hypertension 07/26/2011  . Osteoarthrosis 10/16/2009  . 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 Pre-operative cardiovascular examination 08/22/2008  . Anxiety 04/27/2003   Beaulah Dinning, PT, DPT 07/23/20 12:37 PM  [place] [facility] [address] [place], [place], [address] Phone: [phone]   Fax:  [phone]  Name: Christine [name] MRN: [id number] Date of Birth: [birth year]

## 2020-07-26 ENCOUNTER — Ambulatory Visit: Payer: Medicare HMO | Attending: Family Medicine | Admitting: Rehabilitative and Restorative Service Providers"

## 2020-07-26 ENCOUNTER — Other Ambulatory Visit: Payer: Self-pay

## 2020-07-26 ENCOUNTER — Encounter: Payer: Self-pay | Admitting: Rehabilitative and Restorative Service Providers"

## 2020-07-26 DIAGNOSIS — M6281 Muscle weakness (generalized): Secondary | ICD-10-CM

## 2020-07-26 DIAGNOSIS — G8929 Other chronic pain: Secondary | ICD-10-CM | POA: Diagnosis not present

## 2020-07-26 DIAGNOSIS — M25561 Pain in right knee: Secondary | ICD-10-CM | POA: Insufficient documentation

## 2020-07-26 NOTE — Patient Instructions (Signed)
Advised pt she may be sore tomorrow and to keep doing her exercises and her bike at home.

## 2020-07-26 NOTE — Therapy (Signed)
[place], [place], [address] Phone: [phone]   Fax:  [phone]  Physical Therapy Treatment  Patient Details  Name: Christine [name] MRN: [id number] Date of Birth: 01-11-1951 Referring Provider (PT): Clearance Coots, MD   Encounter Date: 07/26/2020   PT End of Session - 07/26/20 1215    Visit Number 4    Number of Visits 12    Date for PT Re-Evaluation 08/20/20    Authorization Type Aetna MCR    PT Start Time 1122    PT Stop Time 1208    PT Time Calculation (min) 46 min    Activity Tolerance Patient tolerated treatment well;No increased pain    Behavior During Therapy WFL for tasks assessed/performed           Past Medical History:  Diagnosis Date  . Anemia   . Anxiety   . Arthritis    "knees" (04/26/2016)  . Colon polyps    benign per pt  . Coronary artery disease    mild per 2015 cath in [place] (OM1 30%, RCA 30%)  . GERD (gastroesophageal reflux disease)   . Gout   . History of hiatal hernia   . Hypertension   . Migraine    "none since early /2017" (05/06/2016)  . Obesity   . OSA on CPAP    uses CPAP  . Pre-diabetes     Past Surgical History:  Procedure Laterality Date  . ABDOMINAL HYSTERECTOMY     "partial"; both ovaries present  . CARDIAC CATHETERIZATION  ~ 2015  . CARDIAC CATHETERIZATION  2015   In [place]  . CHILECTOMY Right 06/01/2017   Procedure: CHILECTOMY RIGHT FOOT;  Surgeon: Edrick Kins, DPM;  Location: [place];  Service: Podiatry;  Laterality: Right;  . LAPAROSCOPIC APPENDECTOMY N/A 05/06/2016   Procedure: LAPAROSCOPIC APPENDECTOMY;  Surgeon: Donnie Mesa, MD;  Location: [place];  Service: General;  Laterality: N/A;  . TONSILLECTOMY      There were no vitals filed for this visit.                      [place] Adult PT Treatment/Exercise - 07/26/20 0001      Knee/Hip Exercises: Aerobic   Nustep L4 x 5 min bil UE/LEs x 5 min for functional  strengthening      Knee/Hip Exercises: Standing   Other Standing Knee Exercises to increase R SLS and functional strengthening, L hip abdct/L hip ext, L hamstring curl x 15 each. squats x 15. R 2 lb SLR x 20, R hip abdct/R hip ext/R hamstring curl x 15 each. standing double heel raise x 20 bil; standing toe raises x 20 bil; dynamic squats moving L LE in and out with R iso squat hold x 15. All performed at countertop      Knee/Hip Exercises: Seated   Other Seated Knee/Hip Exercises 2 lb seated iso LAQ with hip abdct/addct combo x 20 each, 2 lb march/LAQ x 20 R LE                    PT Short Term Goals - 07/26/20 1218      PT SHORT TERM GOAL #1   Title Pt will be I with initial HEP to assist with strengthening for improved function    Status On-going      PT SHORT TERM GOAL #2   Title pt will report improvement in R knee pain x 25% to assist with  improved sit to stand transfers    Status Partially Met             PT Long Term Goals - 07/09/20 1748      PT LONG TERM GOAL #1   Title Pt will be able to use step stool for bed with R leg going first or other compensation with 75% less difficulty    Baseline difficult 100% of the time with R LE leading    Time 6    Period Weeks    Status New    Target Date 08/20/20      PT LONG TERM GOAL #2   Title Pt will be able to stand x 25 min to perform functional mobility with decreased R knee pain    Baseline 10 min    Time 6    Period Weeks    Status New    Target Date 08/20/20      PT LONG TERM GOAL #3   Title Pt will be able to walk x 25 min to perform recreational activity without a cart with improved R knee pain    Baseline 15 min with cart    Time 6    Period Weeks    Status New    Target Date 08/20/20      PT LONG TERM GOAL #4   Title pt will be indep with advanced HEP in prep for DC for continued maintenance of knee pain    Baseline initial HEP issued at eval    Time 6    Period Weeks    Status New    Target  Date 08/20/20      PT LONG TERM GOAL #5   Title Pt will have improved foto score to 45% limited for improved impairment rating    Baseline 59% limited    Time 6    Period Weeks    Status New    Target Date 08/20/20                 Plan - 07/26/20 1216    Clinical Impression Statement Pt reports no dizziness with treatment. Treatment today focused on R LE strengthening in weightbearing and non-weightbearing. No c/o pain reported with treatment. Pt would continue to benefit from R LE strengthening all positions to improve functional mobility. Continue to monitor for dizziness/BP as indicated in prior treatments.    PT Treatment/Interventions Cryotherapy;Electrical Stimulation;Moist Heat;Ultrasound;Functional mobility training;Stair training;Gait training;Therapeutic activities;Therapeutic exercise;Balance training;Neuromuscular re-education;Patient/family education;Manual techniques;Taping    PT Next Visit Plan Monitor BP as needed, Continue/progress R knee and hip strengthening with open and closed chain exercises as tolerated, update HEP prn    Consulted and Agree with Plan of Care Patient           Patient will benefit from skilled therapeutic intervention in order to improve the following deficits and impairments:  Abnormal gait,Decreased balance,Decreased endurance,Decreased mobility,Difficulty walking,Decreased activity tolerance,Decreased strength,Pain  Visit Diagnosis: Chronic pain of right knee  Muscle weakness (generalized)     Problem List Patient Active Problem List   Diagnosis Date Noted  . 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  . Globus sensation 11/15/2019  . Laryngopharyngeal reflux (LPR) 11/15/2019  . Throat clearing 10/05/2019  . Gout 08/31/2019  . Phlebitis 08/31/2019  . Prediabetes 08/31/2019  . OA (osteoarthritis) of knee 05/17/2019  . Acute pain of right knee 05/17/2019  . Abdominal pain 03/31/2019  . Morbid obesity ([place])  03/29/2018  . GAD (generalized anxiety disorder) 02/27/2018  .  Mixed incontinence urge and stress 02/27/2018  . OSA on CPAP 06/16/2017  . Acute appendicitis 05/06/2016  . Gastro-esophageal reflux disease with esophagitis 07/26/2011  . Essential hypertension 07/26/2011  . Osteoarthrosis 10/16/2009  . 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 Pre-operative cardiovascular examination 08/22/2008  . Anxiety 04/27/2003    America Brown, PT, DPT 07/26/2020, 12:20 PM  [facility] [address] [place], [place], [address] Phone: [phone]   Fax:  [phone]  Name: Rhoda Waldvogel MRN: [id number] Date of Birth: Sep 21, 1950

## 2020-07-29 ENCOUNTER — Ambulatory Visit: Payer: Medicare HMO | Admitting: Physical Therapy

## 2020-08-01 ENCOUNTER — Other Ambulatory Visit: Payer: Self-pay

## 2020-08-01 ENCOUNTER — Encounter: Payer: Self-pay | Admitting: Physical Therapy

## 2020-08-01 ENCOUNTER — Ambulatory Visit: Payer: Medicare HMO | Admitting: Physical Therapy

## 2020-08-01 DIAGNOSIS — M6281 Muscle weakness (generalized): Secondary | ICD-10-CM | POA: Diagnosis not present

## 2020-08-01 DIAGNOSIS — M25561 Pain in right knee: Secondary | ICD-10-CM | POA: Diagnosis not present

## 2020-08-01 DIAGNOSIS — G8929 Other chronic pain: Secondary | ICD-10-CM

## 2020-08-01 NOTE — Therapy (Signed)
[place], [place], [address] Phone: [phone]   Fax:  [phone]  Physical Therapy Treatment  Patient Details  Name: Christine [name] MRN: [id number] Date of Birth: 1951/02/13 Referring Provider (PT): Clearance Coots, MD   Encounter Date: 08/01/2020   PT End of Session - 08/01/20 1204    Visit Number 5    Number of Visits 12    Date for PT Re-Evaluation 08/20/20    Authorization Type Aetna MCR    PT Start Time 3382    PT Stop Time 1227    PT Time Calculation (min) 42 min           Past Medical History:  Diagnosis Date  . Anemia   . Anxiety   . Arthritis    "knees" (04/26/2016)  . Colon polyps    benign per pt  . Coronary artery disease    mild per 2015 cath in [place] (OM1 30%, RCA 30%)  . GERD (gastroesophageal reflux disease)   . Gout   . History of hiatal hernia   . Hypertension   . Migraine    "none since early /2017" (05/06/2016)  . Obesity   . OSA on CPAP    uses CPAP  . Pre-diabetes     Past Surgical History:  Procedure Laterality Date  . ABDOMINAL HYSTERECTOMY     "partial"; both ovaries present  . CARDIAC CATHETERIZATION  ~ 2015  . CARDIAC CATHETERIZATION  2015   In [place]  . CHILECTOMY Right 06/01/2017   Procedure: CHILECTOMY RIGHT FOOT;  Surgeon: Edrick Kins, DPM;  Location: [place];  Service: Podiatry;  Laterality: Right;  . LAPAROSCOPIC APPENDECTOMY N/A 05/06/2016   Procedure: LAPAROSCOPIC APPENDECTOMY;  Surgeon: Donnie Mesa, MD;  Location: [place];  Service: General;  Laterality: N/A;  . TONSILLECTOMY      There were no vitals filed for this visit.   Subjective Assessment - 08/01/20 1149    Subjective I felt fine after last time. No pain today. Less overall aches and pains.    Currently in Pain? No/denies              [facility] PT Assessment - 08/01/20 0001      AROM   Overall AROM Comments 107 AROM knee                         [facility] Adult  PT Treatment/Exercise - 08/01/20 0001      Knee/Hip Exercises: Aerobic   Nustep L5 x 5 min LE only      Knee/Hip Exercises: Standing   Heel Raises 15 reps    Heel Raises Limitations heel and toe    Knee Flexion 15 reps;Right;Left    Hip Abduction AROM;Stengthening;Right;Left;2 sets;10 reps;Knee straight    Abduction Limitations 3 lb. ankle weights    Functional Squat Limitations x 20 squat at counter      Knee/Hip Exercises: Seated   Long Arc Quad AROM;Strengthening;Right;2 sets;10 reps;Left    Long Arc Quad Weight 4 lbs.    Hamstring Curl AROM;Strengthening;Right;2 sets;10 reps;Left    Hamstring Limitations blue      Knee/Hip Exercises: Supine   [name] Arc Quad Sets AROM;Strengthening;Right;2 sets;10 reps    [name] Arc Quad Sets Limitations 4lbs    Bridges AROM;Strengthening;Both;10 reps    Knee Flexion Limitations adductor squeeze 10 x 2    Other Supine Knee/Hip Exercises clamshell blue band 2x10  PT [name] Term Goals - 08/01/20 1152      PT [name] TERM GOAL #1   Title Pt will be I with initial HEP to assist with strengthening for improved function    Time 3    Period Weeks    Status Achieved    Target Date 07/30/20      PT [name] TERM GOAL #2   Title pt will report improvement in R knee pain x 25% to assist with improved sit to stand transfers    Baseline >25% and improved pain with sit-stands    Time 3    Period Weeks    Status Achieved             PT Long Term Goals - 07/09/20 1748      PT LONG TERM GOAL #1   Title Pt will be able to use step stool for bed with R leg going first or other compensation with 75% less difficulty    Baseline difficult 100% of the time with R LE leading    Time 6    Period Weeks    Status New    Target Date 08/20/20      PT LONG TERM GOAL #2   Title Pt will be able to stand x 25 min to perform functional mobility with decreased R knee pain    Baseline 10 min    Time 6    Period Weeks    Status New     Target Date 08/20/20      PT LONG TERM GOAL #3   Title Pt will be able to walk x 25 min to perform recreational activity without a cart with improved R knee pain    Baseline 15 min with cart    Time 6    Period Weeks    Status New    Target Date 08/20/20      PT LONG TERM GOAL #4   Title pt will be indep with advanced HEP in prep for DC for continued maintenance of knee pain    Baseline initial HEP issued at eval    Time 6    Period Weeks    Status New    Target Date 08/20/20      PT LONG TERM GOAL #5   Title Pt will have improved foto score to 45% limited for improved impairment rating    Baseline 59% limited    Time 6    Period Weeks    Status New    Target Date 08/20/20                 Plan - 08/01/20 1204    Clinical Impression Statement Pt reports independence with HEP and also 25% reduction in pain. She reports decreased pain with STS transfers however no improvement yet in standing and wlaking tolerance. She left treatment last session and had to go shopping. She reports her legs were completely fatigued and hurting after her shopping. Today she reports no pain and participates in open and closed chain strengthening with fatigue, no increased pain. One seated rest break required and leaning on counter between exercises to complete closed chain therex today. STG# 1, #2 met.    PT Next Visit Plan Monitor BP as needed, Continue/progress R knee and hip strengthening with open and closed chain exercises as tolerated, update HEP prn    PT Home Exercise Plan Access Code: [email or web address]  URL: [email or web address]  Date: 07/09/2020  Prepared by: Nikia Artis      Exercises  Supine Quad Set - 1 x daily - 4 x weekly - 1 sets - 10 reps  Supine Heel Slide - 1 x daily - 4 x weekly - 1 sets - 10 reps  Supine Ankle Pumps - 1 x daily - 4 x weekly - 1 sets - 10 reps, partial squats at counter 2x10, seated hip abduction with band 2-3x10, heel raises at counter 2-3 x 10            Patient will benefit from skilled therapeutic intervention in order to improve the following deficits and impairments:  Abnormal gait,Decreased balance,Decreased endurance,Decreased mobility,Difficulty walking,Decreased activity tolerance,Decreased strength,Pain  Visit Diagnosis: Chronic pain of right knee  Muscle weakness (generalized)     Problem List Patient Active Problem List   Diagnosis Date Noted  . 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  . Globus sensation 11/15/2019  . Laryngopharyngeal reflux (LPR) 11/15/2019  . Throat clearing 10/05/2019  . Gout 08/31/2019  . Phlebitis 08/31/2019  . Prediabetes 08/31/2019  . OA (osteoarthritis) of knee 05/17/2019  . Acute pain of right knee 05/17/2019  . Abdominal pain 03/31/2019  . Morbid obesity ([place]) 03/29/2018  . GAD (generalized anxiety disorder) 02/27/2018  . Mixed incontinence urge and stress 02/27/2018  . OSA on CPAP 06/16/2017  . Acute appendicitis 05/06/2016  . Gastro-esophageal reflux disease with esophagitis 07/26/2011  . Essential hypertension 07/26/2011  . Osteoarthrosis 10/16/2009  . 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 Pre-operative cardiovascular examination 08/22/2008  . Anxiety 04/27/2003    Dorene Ar, [place] 08/01/2020, 12:25 PM  [facility] [address] [place], [place], [address] Phone: [phone]   Fax:  [phone]  Name: Christine [name] MRN: [id number] Date of Birth: [birth year]

## 2020-08-05 ENCOUNTER — Other Ambulatory Visit: Payer: Self-pay

## 2020-08-05 ENCOUNTER — Ambulatory Visit: Payer: Medicare HMO | Admitting: Physical Therapy

## 2020-08-05 ENCOUNTER — Encounter: Payer: Self-pay | Admitting: Physical Therapy

## 2020-08-05 DIAGNOSIS — M6281 Muscle weakness (generalized): Secondary | ICD-10-CM

## 2020-08-05 DIAGNOSIS — G8929 Other chronic pain: Secondary | ICD-10-CM

## 2020-08-05 DIAGNOSIS — M25561 Pain in right knee: Secondary | ICD-10-CM | POA: Diagnosis not present

## 2020-08-05 NOTE — Therapy (Signed)
[place], [place], [address] Phone: [phone]   Fax:  [phone]  Physical Therapy Treatment  Patient Details  Name: Christine [name] MRN: [id number] Date of Birth: 1950-11-16 Referring Provider (PT): Clearance Coots, MD   Encounter Date: 08/05/2020   PT End of Session - 08/05/20 1242    Visit Number 6    Number of Visits 12    Date for PT Re-Evaluation 08/20/20    Authorization Type Aetna MCR    PT Start Time 9450   Pt arrived late   PT Stop Time 1229    PT Time Calculation (min) 33 min    Activity Tolerance Patient tolerated treatment well;No increased pain    Behavior During Therapy WFL for tasks assessed/performed           Past Medical History:  Diagnosis Date  . Anemia   . Anxiety   . Arthritis    "knees" (04/26/2016)  . Colon polyps    benign per pt  . Coronary artery disease    mild per 2015 cath in [place] (OM1 30%, RCA 30%)  . GERD (gastroesophageal reflux disease)   . Gout   . History of hiatal hernia   . Hypertension   . Migraine    "none since early /2017" (05/06/2016)  . Obesity   . OSA on CPAP    uses CPAP  . Pre-diabetes     Past Surgical History:  Procedure Laterality Date  . ABDOMINAL HYSTERECTOMY     "partial"; both ovaries present  . CARDIAC CATHETERIZATION  ~ 2015  . CARDIAC CATHETERIZATION  2015   In [place]  . CHILECTOMY Right 06/01/2017   Procedure: CHILECTOMY RIGHT FOOT;  Surgeon: Edrick Kins, DPM;  Location: [place];  Service: Podiatry;  Laterality: Right;  . LAPAROSCOPIC APPENDECTOMY N/A 05/06/2016   Procedure: LAPAROSCOPIC APPENDECTOMY;  Surgeon: Donnie Mesa, MD;  Location: [place];  Service: General;  Laterality: N/A;  . TONSILLECTOMY      There were no vitals filed for this visit.   Subjective Assessment - 08/05/20 1154    Subjective Pt reports she has no pain but is feeling tired and has a headache. Pt reports that after last treatment she was  light headed and checked her vitals once she got to her car and they were slightyl elevated but felt better otherwise.    Pain Score 0-No pain    Pain Location Knee              [facility] PT Assessment - 08/05/20 0001      Observation/Other Assessments   Focus on Therapeutic Outcomes (FOTO)  99% function improved from 59% function                         [facility] Adult PT Treatment/Exercise - 08/05/20 0001      Knee/Hip Exercises: Aerobic   Nustep L6 x 5 min LE only      Knee/Hip Exercises: Standing   Heel Raises 15 reps    Heel Raises Limitations heel and toe    Knee Flexion Right;Left;10 reps;2 sets   3#   Hip Abduction AROM;Stengthening;Right;Left;2 sets;10 reps;Knee straight    Abduction Limitations 3 lb. ankle weights    Functional Squat Limitations x 20 squat at counter      Knee/Hip Exercises: Seated   Long Arc Quad AROM;Strengthening;Right;2 sets;10 reps;Left    Long Arc Quad Weight 4 lbs.  Knee/Hip Exercises: Supine   Bridges AROM;Strengthening;Both;10 reps;2 sets                    PT Short Term Goals - 08/01/20 1152      PT SHORT TERM GOAL #1   Title Pt will be I with initial HEP to assist with strengthening for improved function    Time 3    Period Weeks    Status Achieved    Target Date 07/30/20      PT SHORT TERM GOAL #2   Title pt will report improvement in R knee pain x 25% to assist with improved sit to stand transfers    Baseline >25% and improved pain with sit-stands    Time 3    Period Weeks    Status Achieved             PT Long Term Goals - 08/05/20 1241      PT LONG TERM GOAL #1   Title Pt will be able to use step stool for bed with R leg going first or other compensation with 75% less difficulty    Baseline difficult 100% of the time with R LE leading    Time 6    Status On-going    Target Date 08/20/20      PT LONG TERM GOAL #2   Title Pt will be able to stand x 25 min to perform functional mobility  with decreased R knee pain    Baseline 10 min    Time 6    Status On-going    Target Date 08/20/20      PT LONG TERM GOAL #3   Title Pt will be able to walk x 25 min to perform recreational activity without a cart with improved R knee pain    Baseline 15 min with cart    Time 6    Status On-going    Target Date 08/20/20      PT LONG TERM GOAL #4   Title pt will be indep with advanced HEP in prep for DC for continued maintenance of knee pain    Baseline initial HEP issued at eval    Time 6    Status On-going    Target Date 08/20/20      PT LONG TERM GOAL #5   Title Pt will have improved foto score to 45% limited for improved impairment rating    Baseline 99%    Time 6    Status Achieved                 Plan - 08/05/20 1234    Clinical Impression Statement Pt reports no pain but she did feel dizzy after last treatment. Her FOTO score showed improvement from 59% to 99%. Pt states that she is having no more knee pain and exercise has been helping. She felt fatigued during exercise but felt good following treatment. Pt met LTG #5. Pt is agreeable to discharge next visit.    PT Next Visit Plan Monitor BP as needed, Continue/progress R knee and hip strengthening with open and closed chain exercises as tolerated, update HEP prn    PT Home Exercise Plan Access Code: [email or web address]  URL: [email or web address]  Date: 07/09/2020  Prepared by: Myra Rude    Exercises  Supine Quad Set - 1 x daily - 4 x weekly - 1 sets - 10 reps  Supine Heel Slide - 1 x daily - 4 x weekly - 1 sets -  10 reps  Supine Ankle Pumps - 1 x daily - 4 x weekly - 1 sets - 10 reps, partial squats at counter 2x10, seated hip abduction with band 2-3x10, heel raises at counter 2-3 x 10           Patient will benefit from skilled therapeutic intervention in order to improve the following deficits and impairments:  Abnormal gait,Decreased balance,Decreased endurance,Decreased mobility,Difficulty  walking,Decreased activity tolerance,Decreased strength,Pain  Visit Diagnosis: Chronic pain of right knee  Muscle weakness (generalized)     Problem List Patient Active Problem List   Diagnosis Date Noted  . 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  . Globus sensation 11/15/2019  . Laryngopharyngeal reflux (LPR) 11/15/2019  . Throat clearing 10/05/2019  . Gout 08/31/2019  . Phlebitis 08/31/2019  . Prediabetes 08/31/2019  . OA (osteoarthritis) of knee 05/17/2019  . Acute pain of right knee 05/17/2019  . Abdominal pain 03/31/2019  . Morbid obesity ([place]) 03/29/2018  . GAD (generalized anxiety disorder) 02/27/2018  . Mixed incontinence urge and stress 02/27/2018  . OSA on CPAP 06/16/2017  . Acute appendicitis 05/06/2016  . Gastro-esophageal reflux disease with esophagitis 07/26/2011  . Essential hypertension 07/26/2011  . Osteoarthrosis 10/16/2009  . 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 Pre-operative cardiovascular examination 08/22/2008  . Anxiety 04/27/2003    Dawayne Cirri, SPTA 08/05/2020, 2:08 PM  [facility] [address] [place], [place], [address] Phone: [phone]   Fax:  [phone]  Name: Christine [name] MRN: [id number] Date of Birth: 03/05/1951

## 2020-08-07 ENCOUNTER — Other Ambulatory Visit: Payer: Self-pay

## 2020-08-07 ENCOUNTER — Ambulatory Visit: Payer: Medicare HMO | Admitting: Physical Therapy

## 2020-08-07 DIAGNOSIS — M6281 Muscle weakness (generalized): Secondary | ICD-10-CM | POA: Diagnosis not present

## 2020-08-07 DIAGNOSIS — M25561 Pain in right knee: Secondary | ICD-10-CM

## 2020-08-07 DIAGNOSIS — G8929 Other chronic pain: Secondary | ICD-10-CM | POA: Diagnosis not present

## 2020-08-07 NOTE — Therapy (Signed)
[place], [place], [address] Phone: [phone]   Fax:  [phone]  Physical Therapy Treatment/Discharge  Patient Details  Name: Christine [name] MRN: [id number] Date of Birth: 1950/11/30 Referring Provider (PT): Clearance Coots, MD   Encounter Date: 08/07/2020   PT End of Session - 08/07/20 1431    Visit Number 7    Number of Visits 12    Date for PT Re-Evaluation 08/20/20    Authorization Type Aetna MCR    PT Start Time 1430   pt. arrived late   PT Stop Time 1459    PT Time Calculation (min) 29 min    Activity Tolerance Patient tolerated treatment well    Behavior During Therapy [facility] for tasks assessed/performed           Past Medical History:  Diagnosis Date  . Anemia   . Anxiety   . Arthritis    "knees" (04/26/2016)  . Colon polyps    benign per pt  . Coronary artery disease    mild per 2015 cath in [place] (OM1 30%, RCA 30%)  . GERD (gastroesophageal reflux disease)   . Gout   . History of hiatal hernia   . Hypertension   . Migraine    "none since early /2017" (05/06/2016)  . Obesity   . OSA on CPAP    uses CPAP  . Pre-diabetes     Past Surgical History:  Procedure Laterality Date  . ABDOMINAL HYSTERECTOMY     "partial"; both ovaries present  . CARDIAC CATHETERIZATION  ~ 2015  . CARDIAC CATHETERIZATION  2015   In [place]  . CHILECTOMY Right 06/01/2017   Procedure: CHILECTOMY RIGHT FOOT;  Surgeon: Edrick Kins, DPM;  Location: [place];  Service: Podiatry;  Laterality: Right;  . LAPAROSCOPIC APPENDECTOMY N/A 05/06/2016   Procedure: LAPAROSCOPIC APPENDECTOMY;  Surgeon: Donnie Mesa, MD;  Location: [place];  Service: General;  Laterality: N/A;  . TONSILLECTOMY      There were no vitals filed for this visit.   Subjective Assessment - 08/07/20 1439    Subjective Pt. reports pulled right foot on stool this AM and subsequently having some increased arch pain in right foot-she sees  podiatrist for planned injection after PT session today. No right knee pain but right foot/arch pain 3/10.    Pertinent History see subjective; history of gout R foot; OA R knee; prediabetic              [facility] PT Assessment - 08/07/20 0001      AROM   Overall AROM Comments Right knee AROM 0-111 deg flexion      Strength   Overall Strength Comments right knee flexion and extension 5/5                         [facility] Adult PT Treatment/Exercise - 08/07/20 0001      Knee/Hip Exercises: Aerobic   Nustep L4 x 4 min LE only      Knee/Hip Exercises: Standing   Hip Abduction AROM;Stengthening;Left;Right;1 set;10 reps;Knee straight    Abduction Limitations green band proximal to knees    Functional Squat Limitations x 20 squat at counter      Knee/Hip Exercises: Seated   Long Arc Quad AROM;Strengthening;Right;2 sets;10 reps    Long Arc Quad Limitations Green band      Knee/Hip Exercises: Supine   Short Arc [employer] AROM;Strengthening;Right;2 sets;10 reps    Bridges AROM;Strengthening;Both;10  reps;2 sets                  PT Education - 08/07/20 1447    Education Details HEP updates, POC    Person(s) Educated Patient    Methods Explanation;Verbal cues;Handout    Comprehension Verbalized understanding;Returned demonstration            PT Short Term Goals - 08/01/20 1152      PT SHORT TERM GOAL #1   Title Pt will be I with initial HEP to assist with strengthening for improved function    Time 3    Period Weeks    Status Achieved    Target Date 07/30/20      PT SHORT TERM GOAL #2   Title pt will report improvement in R knee pain x 25% to assist with improved sit to stand transfers    Baseline >25% and improved pain with sit-stands    Time 3    Period Weeks    Status Achieved             PT Long Term Goals - 08/07/20 1433      PT LONG TERM GOAL #1   Title Pt will be able to use step stool for bed with R leg going first or other  compensation with 75% less difficulty    Baseline met    Time 6    Period Weeks    Status Achieved      PT LONG TERM GOAL #2   Title Pt will be able to stand x 25 min to perform functional mobility with decreased R knee pain    Time 6    Period Weeks    Status Achieved      PT LONG TERM GOAL #3   Title Pt will be able to walk x 25 min to perform recreational activity without a cart with improved R knee pain    Time 6    Period Weeks    Status Achieved      PT LONG TERM GOAL #4   Title pt will be indep with advanced HEP in prep for DC for continued maintenance of knee pain    Baseline met/updated    Time 6    Period Weeks    Status Achieved      PT LONG TERM GOAL #5   Title Pt will have improved foto score to 45% limited for improved impairment rating    Baseline 99%    Time 6    Period Weeks    Status Achieved                 Plan - 08/07/20 1525    Clinical Impression Statement Though some setback today with foot pain as noted in subjective pt. has progressed well with therapy with decreased right knee pain and improved functional status as evidenced by FOTO score improvements. Given progress and lack of current functional limitations plan d/c to HEP which was updated today. Pt. has also joined [facility] and will continue to work on independent exercise with [employer] program.    Personal Factors and Comorbidities Comorbidity 1    Comorbidities bil knee OA    Examination-Activity Limitations Squat;Stairs;Locomotion Level;Stand;Transfers    Examination-Participation Restrictions Cleaning;Shop;Community Activity    Stability/Clinical Decision Making Stable/Uncomplicated    Clinical Decision Making Low    Rehab Potential Good    PT Frequency 2x / week    PT Duration 6 weeks    PT Treatment/Interventions  Cryotherapy;Electrical Stimulation;Moist Heat;Ultrasound;Functional mobility training;Stair training;Gait training;Therapeutic activities;Therapeutic  exercise;Balance training;Neuromuscular re-education;Patient/family education;Manual techniques;Taping    PT Next Visit Plan NA    PT Home Exercise Plan Access code: [email or web address]    Consulted and Agree with Plan of Care Patient           Patient will benefit from skilled therapeutic intervention in order to improve the following deficits and impairments:  Abnormal gait,Decreased balance,Decreased endurance,Decreased mobility,Difficulty walking,Decreased activity tolerance,Decreased strength,Pain  Visit Diagnosis: Muscle weakness (generalized)  Chronic pain of right knee     Problem List Patient Active Problem List   Diagnosis Date Noted  . 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  . Globus sensation 11/15/2019  . Laryngopharyngeal reflux (LPR) 11/15/2019  . Throat clearing 10/05/2019  . Gout 08/31/2019  . Phlebitis 08/31/2019  . Prediabetes 08/31/2019  . OA (osteoarthritis) of knee 05/17/2019  . Acute pain of right knee 05/17/2019  . Abdominal pain 03/31/2019  . Morbid obesity ([place]) 03/29/2018  . GAD (generalized anxiety disorder) 02/27/2018  . Mixed incontinence urge and stress 02/27/2018  . OSA on CPAP 06/16/2017  . Acute appendicitis 05/06/2016  . Gastro-esophageal reflux disease with esophagitis 07/26/2011  . Essential hypertension 07/26/2011  . Osteoarthrosis 10/16/2009  . 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 Pre-operative cardiovascular examination 08/22/2008  . Anxiety 04/27/2003      PHYSICAL THERAPY DISCHARGE SUMMARY  Visits from Start of Care: 7  Current functional level related to goals / functional outcomes: See above-therapy goals met   Remaining deficits: NA   Education / Equipment: HEP, issued green and blue Therabands Plan: Patient agrees to discharge.  Patient goals were met. Patient is being discharged due to meeting the stated rehab goals.  ?????            Beaulah Dinning, PT, DPT 08/07/20 3:34 PM      [facility] [address] [place], [place], [address] Phone: [phone]   Fax:  [phone]  Name: Beatric Fulop MRN: [id number] Date of Birth: [birth year]

## 2020-08-08 ENCOUNTER — Encounter (INDEPENDENT_AMBULATORY_CARE_PROVIDER_SITE_OTHER): Payer: Self-pay

## 2020-08-18 ENCOUNTER — Other Ambulatory Visit: Payer: Self-pay

## 2020-08-18 ENCOUNTER — Ambulatory Visit: Payer: Medicare HMO | Admitting: Podiatry

## 2020-08-18 DIAGNOSIS — M79675 Pain in left toe(s): Secondary | ICD-10-CM | POA: Diagnosis not present

## 2020-08-18 DIAGNOSIS — M79674 Pain in right toe(s): Secondary | ICD-10-CM | POA: Diagnosis not present

## 2020-08-18 DIAGNOSIS — B351 Tinea unguium: Secondary | ICD-10-CM | POA: Diagnosis not present

## 2020-08-18 DIAGNOSIS — L989 Disorder of the skin and subcutaneous tissue, unspecified: Secondary | ICD-10-CM

## 2020-08-18 NOTE — Progress Notes (Signed)
    Subjective: Patient is a 70 y.o. female presenting to the office today for routine foot care.  Patient states that she is unable to trim her own nails and they are painful with shoe gear.  She also has symptomatic calluses that are painful with shoe gear as well.  She presents today for routine foot care  Past Medical History:  Diagnosis Date   Anemia    Anxiety    Arthritis    "knees" (04/26/2016)   Colon polyps    benign per pt   Coronary artery disease    mild per 2015 cath in [place] (OM1 30%, RCA 30%)   GERD (gastroesophageal reflux disease)    Gout    History of hiatal hernia    Hypertension    Migraine    "none since early /2017" (05/06/2016)   Obesity    OSA on CPAP    uses CPAP   Pre-diabetes     Objective:  Physical Exam General: Alert and oriented x3 in no acute distress  Dermatology: Hyperkeratotic lesions present on the bilateral feet x 4. Pain on palpation with a central nucleated core noted. Skin is warm, dry and supple bilateral lower extremities. Negative for open lesions or macerations. Nails are tender, long, thickened and dystrophic with subungual debris, consistent with onychomycosis, 1-5 bilateral. No signs of infection noted.  Vascular: Palpable pedal pulses bilaterally. No edema or erythema noted. Capillary refill within normal limits.  Neurological: Epicritic and protective threshold grossly intact bilaterally.   Musculoskeletal Exam: Pain on palpation at the keratotic lesions noted. Range of motion within normal limits bilateral. Muscle strength 5/5 in all groups bilateral.    Assessment: 1. Onychodystrophic nails 1-5 bilateral with hyperkeratosis of nails.  2. Onychomycosis of nail due to dermatophyte bilateral 3. Pre-ulcerative callus lesions noted to the bilateral feet x 4  Plan of Care:  1. Patient evaluated. 2. Excisional debridement of keratoic lesion using a chisel blade was performed without incident.  3. Dressed with light  dressing. 4. Mechanical debridement of nails 1-5 bilaterally performed using a nail nipper. Filed with dremel without incident.  5.  Continue OTC Motrin 800 mg as needed  6.  Recommend good supportive shoes that support the foot  7.  Patient is to return to the clinic in 3 months.   Livy Ross M.  [place], DPM [facility]  Dr. Maida Widger M. Aloura Matsuoka, DPM    [address]                                    [place], [place] [address]                Office [phone]  Fax [phone]     

## 2020-08-19 DIAGNOSIS — R69 Illness, unspecified: Secondary | ICD-10-CM | POA: Diagnosis not present

## 2020-08-21 DIAGNOSIS — G4733 Obstructive sleep apnea (adult) (pediatric): Secondary | ICD-10-CM | POA: Diagnosis not present

## 2020-08-29 ENCOUNTER — Encounter: Payer: Self-pay | Admitting: Family Medicine

## 2020-08-29 ENCOUNTER — Ambulatory Visit (INDEPENDENT_AMBULATORY_CARE_PROVIDER_SITE_OTHER): Payer: Medicare HMO | Admitting: Family Medicine

## 2020-08-29 ENCOUNTER — Other Ambulatory Visit: Payer: Self-pay

## 2020-08-29 VITALS — BP 132/82 | HR 61 | Temp 97.6°F | Ht 61.0 in | Wt 290.4 lb

## 2020-08-29 DIAGNOSIS — N3946 Mixed incontinence: Secondary | ICD-10-CM | POA: Diagnosis not present

## 2020-08-29 DIAGNOSIS — S81801A Unspecified open wound, right lower leg, initial encounter: Secondary | ICD-10-CM | POA: Diagnosis not present

## 2020-08-29 MED ORDER — OXYBUTYNIN CHLORIDE ER 15 MG PO TB24: 15.0000 mg | ORAL_TABLET | Freq: Every day | ORAL | 11 refills | Status: DC

## 2020-08-29 NOTE — Patient Instructions (Addendum)
Triple antibiotic ointment twice daily for the next 2 weeks. If no improvement, let me know and we will get you back in with the wound care team.   When you do wash it, use only soap and water. Do not vigorously scrub. Keep the area clean and dry.   Things to look out for: increasing pain not relieved by ibuprofen/acetaminophen, fevers, spreading redness, drainage of pus, or foul odor.  Keep the surrounding skin as moisturized as possible.   Let us know if you need anything.

## 2020-08-29 NOTE — Progress Notes (Signed)
Chief Complaint  Patient presents with  . Wound Check    Christine [name] is a 70 y.o. female here for a skin complaint.  Duration: 7 days Location: RLE Pruritic? No Painful? Yes Drainage? Yes New soaps/lotions/topicals/detergents? No Sick contacts? No Other associated symptoms: starting to blister; no fevers Therapies tried thus far: H2O2  Past Medical History:  Diagnosis Date  . Anemia   . Anxiety   . Arthritis    "knees" (04/26/2016)  . Colon polyps    benign per pt  . Coronary artery disease    mild per 2015 cath in [place] (OM1 30%, RCA 30%)  . GERD (gastroesophageal reflux disease)   . Gout   . History of hiatal hernia   . Hypertension   . Migraine    "none since early /2017" (05/06/2016)  . Obesity   . OSA on CPAP    uses CPAP  . Pre-diabetes     BP 132/82 (BP Location: Left Arm, Patient Position: Sitting, Cuff Size: Large)   Pulse 61   Temp 97.6 F (36.4 C) (Oral)   Ht 5\' 1"  (1.549 m)   Wt 290 lb 6 oz (131.7 kg)   SpO2 94%   BMI 54.87 kg/m  Gen: awake, alert, appearing stated age Lungs: No accessory muscle use Skin: See below. No drainage, erythema, TTP, fluctuance, excoriation Psych: Age appropriate judgment and insight   RLE  Wound of right lower extremity, initial encounter  Morbid obesity ([place]), Chronic  Mixed incontinence urge and stress - Plan: oxybutynin (DITROPAN XL) 15 MG 24 hr tablet  TAO bid for 2 weeks. Wound team if no better. Stop H2O2. No vigorous scrubbing. Soap and water only. Warning signs and symptoms verbalized and written down in AVS.  F/u prn. The patient voiced understanding and agreement to the plan.  [place], DO 08/29/20 1:55 PM

## 2020-09-10 DIAGNOSIS — R69 Illness, unspecified: Secondary | ICD-10-CM | POA: Diagnosis not present

## 2020-09-14 ENCOUNTER — Other Ambulatory Visit: Payer: Self-pay | Admitting: Family Medicine

## 2020-09-15 ENCOUNTER — Encounter: Payer: Self-pay | Admitting: Pulmonary Disease

## 2020-09-15 ENCOUNTER — Ambulatory Visit (INDEPENDENT_AMBULATORY_CARE_PROVIDER_SITE_OTHER): Payer: Medicare HMO | Admitting: Pulmonary Disease

## 2020-09-15 ENCOUNTER — Other Ambulatory Visit: Payer: Self-pay

## 2020-09-15 VITALS — BP 122/62 | HR 79 | Temp 97.3°F | Ht 61.0 in | Wt 290.4 lb

## 2020-09-15 DIAGNOSIS — G4733 Obstructive sleep apnea (adult) (pediatric): Secondary | ICD-10-CM | POA: Diagnosis not present

## 2020-09-15 DIAGNOSIS — G4730 Sleep apnea, unspecified: Secondary | ICD-10-CM | POA: Diagnosis not present

## 2020-09-15 DIAGNOSIS — E669 Obesity, unspecified: Secondary | ICD-10-CM | POA: Diagnosis not present

## 2020-09-15 DIAGNOSIS — Z9989 Dependence on other enabling machines and devices: Secondary | ICD-10-CM

## 2020-09-15 NOTE — Patient Instructions (Signed)
Follow up in 1 year.

## 2020-09-15 NOTE — Progress Notes (Signed)
[facility]  Chief Complaint  Patient presents with  . Follow-up    Wears CPAP-doing good    Constitutional:  BP 122/62 (BP Location: Right Arm, Cuff Size: Large)   Pulse 79   Temp (!) 97.3 F (36.3 C) (Temporal)   Ht 5\' 1"  (1.549 m)   Wt 290 lb 6.4 oz (131.7 kg)   SpO2 99%   BMI 54.87 kg/m   Past Medical History:  Migraine HA, HTN, HH, GERD, Gout, CAD, Colon polyps, OA, Anxiety, Anemia  Past Surgical History:  Christine [name]  has a past surgical history that includes Tonsillectomy; Abdominal hysterectomy; Cardiac catheterization (~ 2015); laparoscopic appendectomy (N/A, 05/06/2016); Cardiac catheterization (2015); and Chilectomy (Right, 06/01/2017).  Brief Summary:  Christine [name] is a 70 y.o. female former smoker with obstructive sleep apnea.      Subjective:   Christine [name] has changed her diet and has been losing weight.  Christine [name] uses CPAP nightly.  Has nasal pillows mask.  No issues with mask fit.  Not having sinus congestion, sore throat, or dry mouth.  Sleeps through the night.  Feels rested during the day.  Physical Exam:   Appearance - well kempt   ENMT - no sinus tenderness, no oral exudate, no LAN, Mallampati 3 airway, no stridor  Respiratory - equal breath sounds bilaterally, no wheezing or rales  CV - s1s2 regular rate and rhythm, no murmurs  Ext - no clubbing, no edema  Skin - no rashes  Psych - normal mood and affect   Sleep Tests:   HST 08/31/17 >> AHI 17.8, SaO2 low 88%  Auto CPAP 08/13/20 to 09/11/20 >> used on 28 of 30 nights with average 7 hrs 41 min.  Average AHI 1.8 with median CPAP 10 and 95 th percentile CPAP 13 cm H2O  Cardiac Tests:   Echo 12/03/19 >> EF 60 to 65%, grade 1 DD, mild LA dilation, mild MR  Social History:  Christine [name]  reports that Christine [name] quit smoking about 42 years ago. Her smoking use included cigarettes. Christine [name] has a 0.60 pack-year smoking history. Christine [name] has never used smokeless tobacco. Christine [name] reports that Christine [name] does  not drink alcohol and does not use drugs.  Family History:  Her family history includes Asthma in her father and mother; Diabetes in her father, mother, and sister; Heart disease in her father and mother; High blood pressure in her father and mother.     Assessment/Plan:   Obstructive sleep apnea. - Christine [name] is compliant with CPAP and reports benefit from therapy - using [employer] for her DME - continue auto CPAP 5 to 15 cm H2O  Obesity. - encouraged her to keep up with her weight loss efforts - explained how obesity can impact sleep apnea, and weight loss can improve control of sleep apnea  Time Spent Involved in Patient Care on Day of Examination:  21 minutes  Follow up:  Patient Instructions  Follow up in 1 year   Medication List:   Allergies as of 09/15/2020      Reactions   Prednisone Swelling   Legs swell    Tape    Sulfa Antibiotics Rash      Medication List       Accurate as of Sep 15, 2020 10:26 AM. If you have any questions, ask your nurse or doctor.        acetaminophen 325 MG tablet Commonly known as: Tylenol Take 2 tablets (650 mg total) by mouth every 6 (six) hours  as needed.   aspirin 81 MG EC tablet Take by mouth.   DULoxetine 60 MG capsule Commonly known as: Cymbalta Take 1 capsule (60 mg total) by mouth daily.   esomeprazole 40 MG capsule Commonly known as: NEXIUM Take 1 capsule (40 mg total) by mouth daily.   fexofenadine 180 MG tablet Commonly known as: ALLEGRA Take 180 mg by mouth daily.   ibuprofen 800 MG tablet Commonly known as: ADVIL Take 1 tablet (800 mg total) by mouth every 8 (eight) hours as needed.   iron polysaccharides 150 MG capsule Commonly known as: NIFEREX Take 150 mg by mouth daily.   metoprolol tartrate 50 MG tablet Commonly known as: LOPRESSOR TAKE 1 TABLET BY MOUTH EVERY DAY   oxybutynin 15 MG 24 hr tablet Commonly known as: DITROPAN XL Take 1 tablet (15 mg total) by mouth at bedtime.    triamterene-hydrochlorothiazide 75-50 MG tablet Commonly known as: MAXZIDE Take 1 tablet by mouth daily.       Signature:  Chesley Mires, MD [place] Pager - [phone] 09/15/2020, 10:26 AM

## 2020-09-20 DIAGNOSIS — G4733 Obstructive sleep apnea (adult) (pediatric): Secondary | ICD-10-CM | POA: Diagnosis not present

## 2020-09-24 ENCOUNTER — Encounter: Payer: Self-pay | Admitting: Family Medicine

## 2020-09-24 ENCOUNTER — Telehealth (INDEPENDENT_AMBULATORY_CARE_PROVIDER_SITE_OTHER): Payer: Medicare HMO | Admitting: Family Medicine

## 2020-09-24 ENCOUNTER — Other Ambulatory Visit: Payer: Self-pay

## 2020-09-24 DIAGNOSIS — R197 Diarrhea, unspecified: Secondary | ICD-10-CM

## 2020-09-24 NOTE — Progress Notes (Signed)
Chief Complaint  Patient presents with  . Diarrhea  . Fever  . Generalized Body Aches    Negative covid test    Christine [name] here for diarrhea. Due to COVID-19 pandemic, we are interacting via web portal for an electronic face-to-face visit. I verified patient's ID using 2 identifiers. Patient agreed to proceed with visit via this method. Patient is at home, I am at office. Patient and I are present for visit.   Duration: 3 days  Associated symptoms: subjective fever, myalgias and diarrhea/incontinence Denies: sinus congestion, sinus pain, rhinorrhea, itchy watery eyes, ear pain, ear drainage, sore throat, wheezing, shortness of breath and N/V Treatment to date: [place] contacts: No  No recent new foods or abx use.  Neg covid test on 5/31.   Past Medical History:  Diagnosis Date  . Anemia   . Anxiety   . Arthritis    "knees" (04/26/2016)  . Colon polyps    benign per pt  . Coronary artery disease    mild per 2015 cath in [place] (OM1 30%, RCA 30%)  . GERD (gastroesophageal reflux disease)   . Gout   . History of hiatal hernia   . Hypertension   . Migraine    "none since early /2017" (05/06/2016)  . Obesity   . OSA on CPAP    uses CPAP  . Pre-diabetes    Exam No conversational dyspnea Age appropriate judgment and insight Nml affect and mood  Diarrhea, unspecified type  Things have gotten better. Will let it run its course. Push fluids w electrolytes. Send message if worsening. Tylenol prn.  F/u prn. Pt voiced understanding and agreement to the plan.  [place], DO 09/24/20 3:33 PM

## 2020-09-25 ENCOUNTER — Emergency Department (HOSPITAL_COMMUNITY)
Admission: EM | Admit: 2020-09-25 | Discharge: 2020-09-25 | Disposition: A | Discharge status: Home / Self Care | Payer: Medicare HMO | Attending: Emergency Medicine | Admitting: Emergency Medicine

## 2020-09-25 ENCOUNTER — Emergency Department (HOSPITAL_COMMUNITY): Payer: Medicare HMO

## 2020-09-25 ENCOUNTER — Encounter (HOSPITAL_COMMUNITY): Payer: Self-pay | Admitting: Emergency Medicine

## 2020-09-25 ENCOUNTER — Other Ambulatory Visit: Payer: Self-pay

## 2020-09-25 DIAGNOSIS — Z7982 Long term (current) use of aspirin: Secondary | ICD-10-CM | POA: Insufficient documentation

## 2020-09-25 DIAGNOSIS — I10 Essential (primary) hypertension: Secondary | ICD-10-CM | POA: Diagnosis not present

## 2020-09-25 DIAGNOSIS — N281 Cyst of kidney, acquired: Secondary | ICD-10-CM | POA: Insufficient documentation

## 2020-09-25 DIAGNOSIS — M79672 Pain in left foot: Secondary | ICD-10-CM | POA: Insufficient documentation

## 2020-09-25 DIAGNOSIS — R000 Tachycardia, unspecified: Secondary | ICD-10-CM | POA: Diagnosis not present

## 2020-09-25 DIAGNOSIS — M79671 Pain in right foot: Secondary | ICD-10-CM | POA: Insufficient documentation

## 2020-09-25 DIAGNOSIS — I700 Atherosclerosis of aorta: Secondary | ICD-10-CM | POA: Diagnosis not present

## 2020-09-25 DIAGNOSIS — Z79899 Other long term (current) drug therapy: Secondary | ICD-10-CM | POA: Insufficient documentation

## 2020-09-25 DIAGNOSIS — E876 Hypokalemia: Secondary | ICD-10-CM | POA: Insufficient documentation

## 2020-09-25 DIAGNOSIS — Z87891 Personal history of nicotine dependence: Secondary | ICD-10-CM | POA: Insufficient documentation

## 2020-09-25 DIAGNOSIS — Z85831 Personal history of malignant neoplasm of soft tissue: Secondary | ICD-10-CM | POA: Insufficient documentation

## 2020-09-25 DIAGNOSIS — Z20822 Contact with and (suspected) exposure to covid-19: Secondary | ICD-10-CM | POA: Diagnosis not present

## 2020-09-25 DIAGNOSIS — Z9049 Acquired absence of other specified parts of digestive tract: Secondary | ICD-10-CM | POA: Diagnosis not present

## 2020-09-25 DIAGNOSIS — E860 Dehydration: Secondary | ICD-10-CM | POA: Diagnosis not present

## 2020-09-25 DIAGNOSIS — R1030 Lower abdominal pain, unspecified: Secondary | ICD-10-CM | POA: Insufficient documentation

## 2020-09-25 DIAGNOSIS — R197 Diarrhea, unspecified: Secondary | ICD-10-CM

## 2020-09-25 DIAGNOSIS — M47816 Spondylosis without myelopathy or radiculopathy, lumbar region: Secondary | ICD-10-CM | POA: Diagnosis not present

## 2020-09-25 DIAGNOSIS — I2510 Atherosclerotic heart disease of native coronary artery without angina pectoris: Secondary | ICD-10-CM | POA: Insufficient documentation

## 2020-09-25 LAB — COMPREHENSIVE METABOLIC PANEL
ALT: 19 U/L (ref 0–44)
AST: 27 U/L (ref 15–41)
Albumin: 3.3 g/dL — ABNORMAL LOW (ref 3.5–5.0)
Alkaline Phosphatase: 73 U/L (ref 38–126)
Anion gap: 12 (ref 5–15)
BUN: 26 mg/dL — ABNORMAL HIGH (ref 8–23)
CO2: 19 mmol/L — ABNORMAL LOW (ref 22–32)
Calcium: 9 mg/dL (ref 8.9–10.3)
Chloride: 107 mmol/L (ref 98–111)
Creatinine, Ser: 1.42 mg/dL — ABNORMAL HIGH (ref 0.44–1.00)
GFR, Estimated: 41 mL/min — ABNORMAL LOW (ref >60)
Glucose, Bld: 135 mg/dL — ABNORMAL HIGH (ref 70–99)
Potassium: 3.2 mmol/L — ABNORMAL LOW (ref 3.5–5.1)
Sodium: 138 mmol/L (ref 135–145)
Total Bilirubin: 0.7 mg/dL (ref 0.3–1.2)
Total Protein: 7.8 g/dL (ref 6.5–8.1)

## 2020-09-25 LAB — CBC
HCT: 33.3 % — ABNORMAL LOW (ref 36.0–46.0)
Hemoglobin: 10.3 g/dL — ABNORMAL LOW (ref 12.0–15.0)
MCH: 26.5 pg (ref 26.0–34.0)
MCHC: 30.9 g/dL (ref 30.0–36.0)
MCV: 85.8 fL (ref 80.0–100.0)
Platelets: 265 10*3/uL (ref 150–400)
RBC: 3.88 MIL/uL (ref 3.87–5.11)
RDW: 14.9 % (ref 11.5–15.5)
WBC: 7.3 10*3/uL (ref 4.0–10.5)
nRBC: 0 % (ref 0.0–0.2)

## 2020-09-25 LAB — MAGNESIUM: Magnesium: 1.7 mg/dL (ref 1.7–2.4)

## 2020-09-25 LAB — LIPASE, BLOOD: Lipase: 34 U/L (ref 11–51)

## 2020-09-25 MED ORDER — ACETAMINOPHEN 500 MG PO TABS
1000.0000 mg | ORAL_TABLET | Freq: Once | ORAL | Status: AC
  Administered 2020-09-25: 1000 mg via ORAL
  Filled 2020-09-25: qty 2

## 2020-09-25 MED ORDER — HYDROCODONE-ACETAMINOPHEN 5-325 MG PO TABS
1.0000 | ORAL_TABLET | Freq: Once | ORAL | Status: AC
  Administered 2020-09-25: 1 via ORAL
  Filled 2020-09-25: qty 1

## 2020-09-25 MED ORDER — LOPERAMIDE HCL 2 MG PO CAPS: 2.0000 mg | ORAL_CAPSULE | Freq: Four times a day (QID) | ORAL | 0 refills | Status: DC | PRN

## 2020-09-25 MED ORDER — HYDROCODONE-ACETAMINOPHEN 5-325 MG PO TABS: 2.0000 | ORAL_TABLET | ORAL | 0 refills | Status: DC | PRN

## 2020-09-25 MED ORDER — POTASSIUM CHLORIDE 10 MEQ/100ML IV SOLN
10.0000 meq | INTRAVENOUS | Status: AC
  Administered 2020-09-25 (×2): 10 meq via INTRAVENOUS
  Filled 2020-09-25 (×2): qty 100

## 2020-09-25 MED ORDER — LACTATED RINGERS IV BOLUS
1000.0000 mL | Freq: Once | INTRAVENOUS | Status: AC
  Administered 2020-09-25: 1000 mL via INTRAVENOUS

## 2020-09-25 NOTE — ED Provider Notes (Signed)
Emergency Medicine Provider Triage Evaluation Note  Christine [name] , a 70 y.o. female  was evaluated in triage.  Pt complains of bilateral leg pain for the past 2 days.  States that she has been noticing watery diarrhea for the past few days as well.  This has been persistent several times in a day.  Reports that her abdomen feels "angry."  Denies any vomiting, numbness or injuries.  Review of Systems  Positive: Abdominal pain, diarrhea Negative: Vomiting, numbness  Physical Exam  BP (!) 149/63   Pulse 99   Temp 100.2 F (37.9 C) (Oral)   Resp (!) 22   SpO2 100%  Gen:   Awake, no distress   Resp:  Normal effort  MSK:   Moves extremities without difficulty  Other:  Normal sensation of bilateral lower extremities.  Moving bilateral lower extremities.  Abdomen is nontender  Medical Decision Making  Medically screening exam initiated at 1:11 PM.  Appropriate orders placed.  Sashia Campas was informed that the remainder of the evaluation will be completed by another provider, this initial triage assessment does not replace that evaluation, and the importance of remaining in the ED until their evaluation is complete.  Lab work ordered   Delia Heady, PA-C 09/25/20 1316    Pattricia Boss, MD 09/29/20 1414

## 2020-09-25 NOTE — ED Triage Notes (Signed)
Patient complains of sudden onset of bilateral leg pain that she woke up with Sunday morning that made it difficult to walk and get out of bed. Patient also reports multiple watery bowel movements. Patient alert, oriented, and in no apparent distress at this time.

## 2020-09-25 NOTE — Discharge Instructions (Signed)
The CAT scan today of your abdomen was normal without any signs of diverticulitis or infection.  The labs did show that you were dehydrated and your potassium was low.  This might be what is causing some of the pain in your feet.  You have a good pulse today and no sign of infection in your feet.  You can try taking some Imodium for the diarrhea and there are some foods you can try to help relieve the diarrhea.  However if you continue to have the diarrhea or you start noticing redness, color change in your feet you need to be reevaluated and I recommend seeing your doctor early next week for recheck.

## 2020-09-25 NOTE — ED Provider Notes (Signed)
[facility] EMERGENCY DEPARTMENT Provider Note   CSN: [id number] Arrival date & time: 09/25/20  1257     History Chief Complaint  Patient presents with  . Leg Pain    Christine [name] is a 70 y.o. female.  Patient is a 70 year old female with a history of hypertension, GERD, CAD and colon polyps who is presenting today with several complaints.  Patient reports on Sunday which was 5 days prior to arrival she developed severe diarrhea.  She reports she was having so many watery stools she could not even wear pants because she kept messing them up.  That persisted into Monday but then started to improve on Tuesday.  She continues to have diarrhea but not as much as it had been.  She has had 4 episodes today.  She has intermittent abdominal cramping and sometimes her abdomen feels angry.  She has not been eating much other than drinking some propel and trying to eat some fruit.  She has had intermittent fever since Saturday but denies any chest pain, shortness of breath or cough.  2 days ago she started to have bilateral feet pain that have been persistent.  She reports her feet are now so tender it is difficult for her to bear weight on them.  She has not taken anything for the pain.  She thinks they may be a little bit more swollen but not significantly.  They have not been red.  She has not had any nausea or vomiting.  She has no known sick contacts and no prior history of excessive diarrhea.  No antibiotics in the last 1 to 2 months.  No recent abdominal surgeries.  She denies any urinary symptoms.  No known sick contacts or bad food exposure.  The history is provided by the patient and medical records.  Leg Pain      Past Medical History:  Diagnosis Date  . Anemia   . Anxiety   . Arthritis    "knees" (04/26/2016)  . Colon polyps    benign per pt  . Coronary artery disease    mild per 2015 cath in [place] (OM1 30%, RCA 30%)  . GERD (gastroesophageal reflux disease)    . Gout   . History of hiatal hernia   . Hypertension   . Migraine    "none since early /2017" (05/06/2016)  . Obesity   . OSA on CPAP    uses CPAP  . Pre-diabetes     Patient Active Problem List   Diagnosis Date Noted  . 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  . Globus sensation 11/15/2019  . Laryngopharyngeal reflux (LPR) 11/15/2019  . Throat clearing 10/05/2019  . Gout 08/31/2019  . Phlebitis 08/31/2019  . Prediabetes 08/31/2019  . OA (osteoarthritis) of knee 05/17/2019  . Acute pain of right knee 05/17/2019  . Abdominal pain 03/31/2019  . Morbid obesity ([place]) 03/29/2018  . GAD (generalized anxiety disorder) 02/27/2018  . Mixed incontinence urge and stress 02/27/2018  . OSA on CPAP 06/16/2017  . Acute appendicitis 05/06/2016  . Gastro-esophageal reflux disease with esophagitis 07/26/2011  . Essential hypertension 07/26/2011  . Osteoarthrosis 10/16/2009  . 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 Pre-operative cardiovascular examination 08/22/2008  . Anxiety 04/27/2003    Past Surgical History:  Procedure Laterality Date  . ABDOMINAL HYSTERECTOMY     "partial"; both ovaries present  . CARDIAC CATHETERIZATION  ~ 2015  . CARDIAC CATHETERIZATION  2015   In [place]  . CHILECTOMY Right 06/01/2017   Procedure: CHILECTOMY RIGHT FOOT;  Surgeon: Edrick Kins, DPM;  Location: [facility];  Service: Podiatry;  Laterality: Right;  . LAPAROSCOPIC APPENDECTOMY N/A 05/06/2016   Procedure: LAPAROSCOPIC APPENDECTOMY;  Surgeon: Donnie Mesa, MD;  Location: [facility];  Service: General;  Laterality: N/A;  . TONSILLECTOMY       OB History    Gravida  0   Para  0   Term  0   Preterm  0   AB  0   Living  0     SAB  0   IAB  0   Ectopic  0   Multiple  0   Live Births  0           Family History  Problem Relation Age of Onset  . Diabetes Mother   . Heart disease Mother   . High  blood pressure Mother   . Asthma Mother   . Diabetes Father   . Heart disease Father   . High blood pressure Father   . Asthma Father   . Diabetes Sister   . Colon cancer Neg Hx   . Esophageal cancer Neg Hx   . Rectal cancer Neg Hx     Social History   Tobacco Use  . Smoking status: Former Smoker    Packs/day: 0.12    Years: 5.00    Pack years: 0.60    Types: Cigarettes    Quit date: 1980    Years since quitting: 42.4  . Smokeless tobacco: Never Used  Vaping Use  . Vaping Use: Never used  Substance Use Topics  . Alcohol use: No  . Drug use: No    Home Medications Prior to Admission medications   Medication Sig Start Date End Date Taking? Authorizing Provider  acetaminophen (TYLENOL) 325 MG tablet Take 2 tablets (650 mg total) by mouth every 6 (six) hours as needed. 01/18/20   Darr, Edison Nasuti, PA-C  aspirin 81 MG EC tablet Take by mouth.    [provider]  DULoxetine (CYMBALTA) 60 MG capsule Take 1 capsule (60 mg total) by mouth daily. 04/11/20   Shelda Pal, DO  esomeprazole (NEXIUM) 40 MG capsule Take 1 capsule (40 mg total) by mouth daily. 01/10/20   Wendling, Crosby Oyster, DO  fexofenadine (ALLEGRA) 180 MG tablet Take 180 mg by mouth daily.    [provider]  ibuprofen (ADVIL) 800 MG tablet Take 1 tablet (800 mg total) by mouth every 8 (eight) hours as needed. 07/23/20   Shelda Pal, DO  iron polysaccharides (NIFEREX) 150 MG capsule Take 150 mg by mouth daily.    [provider]  metoprolol tartrate (LOPRESSOR) 50 MG tablet TAKE 1 TABLET BY MOUTH EVERY DAY 09/15/20   Wendling, Crosby Oyster, DO  oxybutynin (DITROPAN XL) 15 MG 24 hr tablet Take 1 tablet (15 mg total) by mouth at bedtime. 08/29/20   Wendling, Crosby Oyster, DO  triamterene-hydrochlorothiazide (MAXZIDE) 75-50 MG tablet Take 1 tablet by mouth daily. 04/11/20   Shelda Pal, DO    Allergies    Prednisone, Tape, and Sulfa antibiotics  Review of  Systems   Review of Systems  All other systems reviewed and are negative.   Physical Exam Updated Vital Signs BP (!) 148/66   Pulse 93   Temp 98.1 F (36.7 C) (Oral)   Resp 18   SpO2 100%   Physical Exam Vitals and nursing note  reviewed.  Constitutional:      General: She is not in acute distress.    Appearance: Normal appearance. She is well-developed.  HENT:     Head: Normocephalic and atraumatic.     Mouth/Throat:     Mouth: Mucous membranes are dry.  Eyes:     Extraocular Movements: Extraocular movements intact.     Conjunctiva/sclera: Conjunctivae normal.     Pupils: Pupils are equal, round, and reactive to light.  Cardiovascular:     Rate and Rhythm: Regular rhythm. Tachycardia present.     Heart sounds: Normal heart sounds. No murmur heard. No friction rub.  Pulmonary:     Effort: Pulmonary effort is normal.     Breath sounds: Normal breath sounds. No wheezing or rales.  Abdominal:     General: Bowel sounds are normal. There is no distension.     Palpations: Abdomen is soft.     Tenderness: There is abdominal tenderness. There is no guarding or rebound.     Comments: Minimal lower abdominal tenderness  Musculoskeletal:        General: Tenderness present. Normal range of motion.     Comments: Skin changes in the bilateral shins consistent with chronic venous stasis.  Minimal swelling around bilateral ankles but no erythema, warmth.  2+ palpable DP pulse bilaterally.  Capillary refill less than 2 seconds.  Tenderness with palpation diffusely over the dorsal and plantar surface of the feet.  No rashes, wounds.  Skin:    General: Skin is warm and dry.     Findings: No rash.  Neurological:     Mental Status: She is alert and oriented to person, place, and time. Mental status is at baseline.     Cranial Nerves: No cranial nerve deficit.  Psychiatric:        Mood and Affect: Mood normal.        Behavior: Behavior normal.     ED Results / Procedures / Treatments    Labs (all labs ordered are listed, but only abnormal results are displayed) Labs Reviewed  COMPREHENSIVE METABOLIC PANEL - Abnormal; Notable for the following components:      Result Value   Potassium 3.2 (*)    CO2 19 (*)    Glucose, Bld 135 (*)    BUN 26 (*)    Creatinine, Ser 1.42 (*)    Albumin 3.3 (*)    GFR, Estimated 41 (*)    All other components within normal limits  CBC - Abnormal; Notable for the following components:   Hemoglobin 10.3 (*)    HCT 33.3 (*)    All other components within normal limits  SARS CORONAVIRUS 2 (TAT 6-24 HRS)  LIPASE, BLOOD  MAGNESIUM  URINALYSIS, ROUTINE W REFLEX MICROSCOPIC    EKG None  Radiology CT ABDOMEN PELVIS WO CONTRAST  Result Date: 09/25/2020 CLINICAL DATA:  Lower abdominal pain EXAM: CT ABDOMEN AND PELVIS WITHOUT CONTRAST TECHNIQUE: Multidetector CT imaging of the abdomen and pelvis was performed following the standard protocol without IV contrast. COMPARISON:  02/28/2019 FINDINGS: Lower chest: No acute abnormality. Hepatobiliary: No focal liver abnormality is seen. No gallstones, gallbladder wall thickening, or biliary dilatation. Pancreas: Unremarkable. No pancreatic ductal dilatation or surrounding inflammatory changes. Spleen: Normal in size without focal abnormality. Adrenals/Urinary Tract: Adrenal glands are within normal limits. Kidneys are well visualized bilaterally. Large renal cyst is noted arising from the lower pole of the left kidney. This is stable in appearance measuring up to 6.2 cm. No obstructive changes are  noted. The bladder is decompressed. Stomach/Bowel: No obstructive or inflammatory changes of the bowel are seen. The appendix has been surgically removed. Small bowel and stomach are unremarkable. Vascular/Lymphatic: Aortic atherosclerosis. No enlarged abdominal or pelvic lymph nodes. Reproductive: Status post hysterectomy. No adnexal masses. Other: No abdominal wall hernia or abnormality. No abdominopelvic ascites.  Musculoskeletal: Degenerative changes of the lumbar spine are noted. IMPRESSION: Stable left renal cyst.  No new focal abnormality is noted. No evidence to suggest diverticulitis. Electronically Signed   By: Inez Catalina M.D.   On: 09/25/2020 18:22    Procedures Procedures   Medications Ordered in ED Medications  lactated ringers bolus 1,000 mL (has no administration in time range)  potassium chloride 10 mEq in 100 mL IVPB (has no administration in time range)  acetaminophen (TYLENOL) tablet 1,000 mg (has no administration in time range)    ED Course  I have reviewed the triage vital signs and the nursing notes.  Pertinent labs & imaging results that were available during my care of the patient were reviewed by me and considered in my medical decision making (see chart for details).    MDM Rules/Calculators/A&P                          70 year old patient presenting today with 5 days of ongoing diarrhea, intermittent fever and now having pain in her feet.  Patient has no evidence to suggest cellulitis or DVT at this time.  Feet are tender bilaterally with palpation but no erythema, induration or wounds present.  Distal arterial pulses are present.  Patient does have some mild lower abdominal pain and with the intermittent fever and diarrhea concern for possible diverticulitis versus COVID versus foodborne illness.  She has no respiratory complaints at this time.  Low risk for C. difficile as she has had no hospitalizations or recent antibiotics.  Concern for dehydration, possible electrolyte abnormality that is leading to the pain.  She was given Tylenol, IV fluids and labs show a normal lipase, CMP with creatinine of 1.42 up from her baseline of 1, mild hypokalemia of 3.2 anion gap.  CBC with persistent anemia that is unchanged and normal white count.  Magnesium and abdominal CT are pending.  12:09 AM CT neg for acute findings.  Mag wnl.  Pt improved after fluids and potassium but still  having some foot pain and given a vicodin.  Will have pt try imodium and f/u with pcp early next week if symptoms are not improving.  Pt given return precautions.  MDM Number of Diagnoses or Management Options   Amount and/or Complexity of Data Reviewed Clinical lab tests: ordered and reviewed Tests in the radiology section of CPT: ordered and reviewed Independent visualization of images, tracings, or specimens: yes    Final Clinical Impression(s) / ED Diagnoses Final diagnoses:  Pain in both feet  Dehydration  Hypokalemia  Diarrhea, unspecified type    Rx / DC Orders ED Discharge Orders         Ordered    HYDROcodone-acetaminophen (NORCO/VICODIN) 5-325 MG tablet  Every 4 hours PRN        09/25/20 2213    loperamide (IMODIUM) 2 MG capsule  4 times daily PRN        09/25/20 2213           Blanchie Dessert, MD 09/26/20 0011

## 2020-09-26 ENCOUNTER — Telehealth (INDEPENDENT_AMBULATORY_CARE_PROVIDER_SITE_OTHER): Payer: Medicare HMO | Admitting: Family Medicine

## 2020-09-26 ENCOUNTER — Encounter: Payer: Self-pay | Admitting: Family Medicine

## 2020-09-26 ENCOUNTER — Other Ambulatory Visit: Payer: Self-pay

## 2020-09-26 DIAGNOSIS — M109 Gout, unspecified: Secondary | ICD-10-CM

## 2020-09-26 LAB — SARS CORONAVIRUS 2 (TAT 6-24 HRS): SARS Coronavirus 2: NEGATIVE

## 2020-09-26 MED ORDER — TRAMADOL HCL 50 MG PO TABS: 50.0000 mg | ORAL_TABLET | Freq: Three times a day (TID) | ORAL | 0 refills | Status: AC | PRN

## 2020-09-26 MED ORDER — METHYLPREDNISOLONE 4 MG PO TBPK: ORAL_TABLET | ORAL | 0 refills | Status: DC

## 2020-09-26 NOTE — Progress Notes (Signed)
Chief Complaint  Patient presents with  . Gout    Gout both feet    Subjective: Patient is a 70 y.o. female here for gout flare. Due to COVID-19 pandemic, we are interacting via web portal for an electronic face-to-face visit. I verified patient's ID using 2 identifiers. Patient agreed to proceed with visit via this method. Patient is at home, I am at office. Patient and I are present for visit.   Pt w hx of gout. Both feet are affected. She has not had a flare in years. She is not on a daily med. Tramadol and Medrol Dosepak have worked well in past.   Past Medical History:  Diagnosis Date  . Anemia   . Anxiety   . Arthritis    "knees" (04/26/2016)  . Colon polyps    benign per pt  . Coronary artery disease    mild per 2015 cath in [place] (OM1 30%, RCA 30%)  . GERD (gastroesophageal reflux disease)   . Gout   . History of hiatal hernia   . Hypertension   . Migraine    "none since early /2017" (05/06/2016)  . Obesity   . OSA on CPAP    uses CPAP  . Pre-diabetes     Objective: No conversational dyspnea Age appropriate judgment and insight Nml affect and mood  Assessment and Plan: Acute gout of foot, unspecified cause, unspecified laterality - Plan: methylPREDNISolone (MEDROL DOSEPAK) 4 MG TBPK tablet, traMADol (ULTRAM) 50 MG tablet  Exacerbation of chronic problem. Medrol Dosepak. Tramadol for breakthrough pain. Tylenol. Ice. Avoid NSAIDs while on steroid.  F/u as originally scheduled.  The patient voiced understanding and agreement to the plan.  [place], DO 09/26/20  1:17 PM

## 2020-10-14 ENCOUNTER — Other Ambulatory Visit: Payer: Self-pay | Admitting: Family Medicine

## 2020-10-14 DIAGNOSIS — G4733 Obstructive sleep apnea (adult) (pediatric): Secondary | ICD-10-CM | POA: Diagnosis not present

## 2020-10-17 ENCOUNTER — Other Ambulatory Visit: Payer: Self-pay

## 2020-10-17 ENCOUNTER — Encounter: Payer: Self-pay | Admitting: Family Medicine

## 2020-10-17 ENCOUNTER — Ambulatory Visit (INDEPENDENT_AMBULATORY_CARE_PROVIDER_SITE_OTHER): Payer: Medicare HMO | Admitting: Family Medicine

## 2020-10-17 VITALS — BP 124/74 | HR 86 | Temp 98.3°F | Ht 61.0 in | Wt 276.1 lb

## 2020-10-17 DIAGNOSIS — R109 Unspecified abdominal pain: Secondary | ICD-10-CM | POA: Diagnosis not present

## 2020-10-17 DIAGNOSIS — R110 Nausea: Secondary | ICD-10-CM

## 2020-10-17 MED ORDER — ONDANSETRON 4 MG PO TBDP: 4.0000 mg | ORAL_TABLET | Freq: Three times a day (TID) | ORAL | 0 refills | Status: DC | PRN

## 2020-10-17 MED ORDER — DICYCLOMINE HCL 10 MG PO CAPS: ORAL_CAPSULE | ORAL | 0 refills | Status: DC

## 2020-10-17 NOTE — Patient Instructions (Signed)
Continue to monitor foods which serve as triggers for your symptoms.  Stay hydrated.  Let us know if you need anything.

## 2020-10-17 NOTE — Progress Notes (Signed)
Chief Complaint  Patient presents with   Follow-up    Subjective: Patient is a 70 y.o. female here for abd pain.  Epigastric pain after eating that is followed by nausea. Followed by that is lower abd burning. L side is worse. No longer having diarrhea. Denies fevers, vomiting, bleeding, dark/tarry stools. Has tried [employer] w little relief. Not getting better. Certain foods serve as a trigger.   Past Medical History:  Diagnosis Date   Anemia    Anxiety    Arthritis    "knees" (04/26/2016)   Colon polyps    benign per pt   Coronary artery disease    mild per 2015 cath in [place] (OM1 30%, RCA 30%)   GERD (gastroesophageal reflux disease)    Gout    History of hiatal hernia    Hypertension    Migraine    "none since early /2017" (05/06/2016)   Obesity    OSA on CPAP    uses CPAP   Pre-diabetes     Objective: BP 124/74   Pulse 86   Temp 98.3 F (36.8 C) (Oral)   Ht 5\' 1"  (1.549 m)   Wt 276 lb 2 oz (125.2 kg)   SpO2 97%   BMI 52.17 kg/m  General: Awake, appears stated age HEENT: MMM Abd: BS+, S, diffusely TTP, worse in upper abd region, ND Heart: RRR Lungs: CTAB, no rales, wheezes or rhonchi. No accessory muscle use Psych: Age appropriate judgment and insight, normal affect and mood  Assessment and Plan: Abdominal pain, unspecified abdominal location - Plan: dicyclomine (BENTYL) 10 MG capsule  Nausea - Plan: ondansetron (ZOFRAN-ODT) 4 MG disintegrating tablet  Take above as needed. Add a probiotic. Monitor food triggers. F/u in 3 weeks if no better. Will consider gall bladder vs IBS approach.  The patient voiced understanding and agreement to the plan.  [place], DO 10/17/20  4:01 PM

## 2020-10-22 ENCOUNTER — Encounter (INDEPENDENT_AMBULATORY_CARE_PROVIDER_SITE_OTHER): Payer: Self-pay

## 2020-10-22 ENCOUNTER — Other Ambulatory Visit: Payer: Self-pay | Admitting: Family Medicine

## 2020-10-22 MED ORDER — DICYCLOMINE HCL 20 MG PO TABS: 20.0000 mg | ORAL_TABLET | Freq: Two times a day (BID) | ORAL | 1 refills | Status: DC

## 2020-11-06 ENCOUNTER — Other Ambulatory Visit: Payer: Self-pay | Admitting: Family Medicine

## 2020-11-13 DIAGNOSIS — G4733 Obstructive sleep apnea (adult) (pediatric): Secondary | ICD-10-CM | POA: Diagnosis not present

## 2020-11-14 DIAGNOSIS — R69 Illness, unspecified: Secondary | ICD-10-CM | POA: Diagnosis not present

## 2020-11-17 ENCOUNTER — Ambulatory Visit (INDEPENDENT_AMBULATORY_CARE_PROVIDER_SITE_OTHER): Payer: Medicare HMO | Admitting: Family Medicine

## 2020-11-17 ENCOUNTER — Ambulatory Visit: Payer: Medicare HMO | Admitting: Podiatry

## 2020-11-17 ENCOUNTER — Other Ambulatory Visit: Payer: Self-pay

## 2020-11-17 ENCOUNTER — Encounter: Payer: Self-pay | Admitting: Family Medicine

## 2020-11-17 ENCOUNTER — Other Ambulatory Visit: Payer: Self-pay | Admitting: Family Medicine

## 2020-11-17 VITALS — BP 140/50 | HR 90 | Temp 97.7°F | Resp 20 | Ht 61.0 in | Wt 281.0 lb

## 2020-11-17 DIAGNOSIS — I10 Essential (primary) hypertension: Secondary | ICD-10-CM

## 2020-11-17 DIAGNOSIS — Z0000 Encounter for general adult medical examination without abnormal findings: Secondary | ICD-10-CM

## 2020-11-17 DIAGNOSIS — H9193 Unspecified hearing loss, bilateral: Secondary | ICD-10-CM | POA: Diagnosis not present

## 2020-11-17 DIAGNOSIS — R413 Other amnesia: Secondary | ICD-10-CM

## 2020-11-17 DIAGNOSIS — R7303 Prediabetes: Secondary | ICD-10-CM

## 2020-11-17 LAB — COMPREHENSIVE METABOLIC PANEL
ALT: 10 U/L (ref 0–35)
AST: 12 U/L (ref 0–37)
Albumin: 3.8 g/dL (ref 3.5–5.2)
Alkaline Phosphatase: 78 U/L (ref 39–117)
BUN: 33 mg/dL — ABNORMAL HIGH (ref 6–23)
CO2: 25 mEq/L (ref 19–32)
Calcium: 9.2 mg/dL (ref 8.4–10.5)
Chloride: 106 mEq/L (ref 96–112)
Creatinine, Ser: 1.13 mg/dL (ref 0.40–1.20)
GFR: 50.28 mL/min — ABNORMAL LOW (ref >60.00)
Glucose, Bld: 79 mg/dL (ref 70–99)
Potassium: 4 mEq/L (ref 3.5–5.1)
Sodium: 140 mEq/L (ref 135–145)
Total Bilirubin: 0.4 mg/dL (ref 0.2–1.2)
Total Protein: 6.8 g/dL (ref 6.0–8.3)

## 2020-11-17 LAB — CBC
HCT: 29.1 % — ABNORMAL LOW (ref 36.0–46.0)
Hemoglobin: 9.3 g/dL — ABNORMAL LOW (ref 12.0–15.0)
MCHC: 32 g/dL (ref 30.0–36.0)
MCV: 85.7 fl (ref 78.0–100.0)
Platelets: 262 10*3/uL (ref 150.0–400.0)
RBC: 3.4 Mil/uL — ABNORMAL LOW (ref 3.87–5.11)
RDW: 17.1 % — ABNORMAL HIGH (ref 11.5–15.5)
WBC: 5.3 10*3/uL (ref 4.0–10.5)

## 2020-11-17 LAB — HEMOGLOBIN A1C: Hgb A1c MFr Bld: 6.2 % (ref 4.6–6.5)

## 2020-11-17 LAB — LIPID PANEL
Cholesterol: 204 mg/dL — ABNORMAL HIGH (ref 0–200)
HDL: 62.2 mg/dL (ref >39.00)
LDL Cholesterol: 116 mg/dL — ABNORMAL HIGH (ref 0–99)
NonHDL: 141.56
Total CHOL/HDL Ratio: 3
Triglycerides: 127 mg/dL (ref 0.0–149.0)
VLDL: 25.4 mg/dL (ref 0.0–40.0)

## 2020-11-17 LAB — VITAMIN B12: Vitamin B-12: 467 pg/mL (ref 211–911)

## 2020-11-17 LAB — T4, FREE: Free T4: 0.8 ng/dL (ref 0.60–1.60)

## 2020-11-17 LAB — TSH: TSH: 2.92 u[IU]/mL (ref 0.35–5.50)

## 2020-11-17 NOTE — Progress Notes (Signed)
Chief Complaint  Patient presents with   Annual Exam     Well Woman Christine [name] is here for a complete physical.   Her last physical was >1 year ago.  Current diet: in general, a "healthy" diet. Current exercise: none lately. Weight is down a bit after GI bug and she denies daytime fatigue. Seatbelt? Sometimes   Health Maintenance Colonoscopy- Due Shingrix- No DEXA- Yes Mammogram- Yes Tetanus- Yes Pneumonia- Yes Hep C screen- Yes  Memory issues For the past 2 to 3 months, the patient has been having difficulty remembering certain things.  She will go to the store and if she only needs 3 things, will forget one of them.  She does not forget who she is in names of people she cares about.  She is acting normally as far she is aware.  No personal history of dementia.  No recent changes in mood.  Past Medical History:  Diagnosis Date   Anemia    Anxiety    Arthritis    "knees" (04/26/2016)   Colon polyps    benign per pt   Coronary artery disease    mild per 2015 cath in [place] (OM1 30%, RCA 30%)   GERD (gastroesophageal reflux disease)    Gout    History of hiatal hernia    Hypertension    Migraine    "none since early /2017" (05/06/2016)   Obesity    OSA on CPAP    uses CPAP   Pre-diabetes      Past Surgical History:  Procedure Laterality Date   ABDOMINAL HYSTERECTOMY     "partial"; both ovaries present   CARDIAC CATHETERIZATION  ~ 2015   CARDIAC CATHETERIZATION  2015   In [place] Right 06/01/2017   Procedure: [place];  Surgeon: Edrick Kins, DPM;  Location: [facility];  Service: Podiatry;  Laterality: Right;   LAPAROSCOPIC APPENDECTOMY N/A 05/06/2016   Procedure: LAPAROSCOPIC APPENDECTOMY;  Surgeon: Donnie Mesa, MD;  Location: [facility];  Service: General;  Laterality: N/A;   TONSILLECTOMY      Medications  Current Outpatient Medications on File Prior to Visit  Medication Sig Dispense Refill   acetaminophen (TYLENOL) 325 MG tablet  Take 2 tablets (650 mg total) by mouth every 6 (six) hours as needed. 30 tablet 0   aspirin 81 MG EC tablet Take by mouth.     dicyclomine (BENTYL) 20 MG tablet Take 1 tablet (20 mg total) by mouth 2 (two) times daily. 20 tablet 1   DULoxetine (CYMBALTA) 60 MG capsule TAKE ONE CAPSULE BY MOUTH DAILY 30 capsule 3   esomeprazole (NEXIUM) 40 MG capsule Take 1 capsule (40 mg total) by mouth daily. 90 capsule 1   fexofenadine (ALLEGRA) 180 MG tablet Take 180 mg by mouth daily.     ibuprofen (ADVIL) 800 MG tablet TAKE 1 TABLET BY MOUTH EVERY 8 HOURS AS NEEDED 90 tablet 0   iron polysaccharides (NIFEREX) 150 MG capsule Take 150 mg by mouth daily.     methylPREDNISolone (MEDROL DOSEPAK) 4 MG TBPK tablet Follow instructions on package. 21 tablet 0   metoprolol tartrate (LOPRESSOR) 50 MG tablet TAKE 1 TABLET BY MOUTH EVERY DAY 90 tablet 1   ondansetron (ZOFRAN-ODT) 4 MG disintegrating tablet Take 1 tablet (4 mg total) by mouth every 8 (eight) hours as needed for nausea or vomiting. 20 tablet 0   oxybutynin (DITROPAN XL) 15 MG 24 hr tablet Take 1 tablet (15 mg total) by mouth at  bedtime. 30 tablet 11   triamterene-hydrochlorothiazide (MAXZIDE) 75-50 MG tablet Take 1 tablet by mouth daily. 30 tablet 8    Allergies Allergies  Allergen Reactions   Prednisone Swelling    Legs swell    Tape    Sulfa Antibiotics Rash    Review of Systems: Constitutional:  no fevers Eye:  no recent significant change in vision Ears:  No changes in hearing Nose/Mouth/Throat:  no complaints of nasal congestion, no sore throat Cardiovascular: no chest pain Respiratory:  No shortness of breath Gastrointestinal:  No change in bowel habits GU:  Female: negative for dysuria Integumentary:  no abnormal skin lesions reported Neurologic:  no headaches Endocrine:  denies unexplained weight changes  Exam BP (!) 140/50   Pulse 90   Temp 97.7 F (36.5 C)   Resp 20   Ht '5\' 1"'$  (1.549 m)   Wt 281 lb (127.5 kg)   SpO2 98%    BMI 53.09 kg/m  General:  well developed, well nourished, in no apparent distress Skin:  no significant moles, warts, or growths Head:  no masses, lesions, or tenderness Eyes:  pupils equal and round, sclera anicteric without injection Ears:  canals without lesions, TMs shiny without retraction, no obvious effusion, no erythema Nose:  nares patent, septum midline, mucosa normal, and no drainage or sinus tenderness Throat/Pharynx:  lips and gingiva without lesion; tongue and uvula midline; non-inflamed pharynx; no exudates or postnasal drainage Neck: neck supple without adenopathy, thyromegaly, or masses Lungs:  clear to auscultation, breath sounds equal bilaterally, no respiratory distress Cardio:  regular rate and rhythm, no bruits or LE edema Abdomen:  abdomen soft, nontender; bowel sounds normal; no masses or organomegaly Genital: Deferred Neuro:  gait normal; deep tendon reflexes normal and symmetric Psych: well oriented with normal range of affect and appropriate judgment/insight  Assessment and Plan  Well adult exam  Essential hypertension - Plan: Comprehensive metabolic panel, CBC  Morbid obesity ([place]) - Plan: Lipid panel  Prediabetes - Plan: Hemoglobin A1c  Bilateral hearing loss, unspecified hearing loss type - Plan: Ambulatory referral to Audiology  Memory changes - Plan: TSH, T4, free, B12   Well 70 y.o. female. Counseled on diet and exercise. Other orders as above. Memory changes: New issue, uncertain prognosis.  Check above labs.  If negative, will get CT of the head without contrast.  If that is negative we will set her up with neuropsychology. Follow up in 6 months for medication check or as needed. The patient voiced understanding and agreement to the plan.  [place], DO 11/17/20 11:20 AM

## 2020-11-17 NOTE — Patient Instructions (Addendum)
Give [place] 2-3 business days to get the results of your labs back.   Keep the diet clean and stay active.  The new Shingrix vaccine (for shingles) is a 2 shot series. It can make people feel low energy, achy and almost like they have the flu for 48 hours after injection. Please plan accordingly when deciding on when to get this shot. Call your pharmacy for an appointment to get this. The second shot of the series is less severe regarding the side effects, but it still lasts 48 hours.   If you do not hear anything about your referral in the next 1-2 weeks, call our office and ask for an update.  Let us know if you need anything.

## 2020-11-18 ENCOUNTER — Other Ambulatory Visit: Payer: Self-pay | Admitting: Family Medicine

## 2020-11-18 ENCOUNTER — Encounter: Payer: Self-pay | Admitting: Gastroenterology

## 2020-11-18 MED ORDER — ROSUVASTATIN CALCIUM 10 MG PO TABS: 10.0000 mg | ORAL_TABLET | Freq: Every day | ORAL | 3 refills | Status: DC

## 2020-11-19 ENCOUNTER — Other Ambulatory Visit: Payer: Self-pay | Admitting: Family Medicine

## 2020-11-19 DIAGNOSIS — E785 Hyperlipidemia, unspecified: Secondary | ICD-10-CM

## 2020-11-19 NOTE — Progress Notes (Signed)
Lipid panel 

## 2020-12-01 ENCOUNTER — Ambulatory Visit (INDEPENDENT_AMBULATORY_CARE_PROVIDER_SITE_OTHER): Payer: Medicare HMO | Admitting: Podiatry

## 2020-12-01 ENCOUNTER — Other Ambulatory Visit: Payer: Self-pay

## 2020-12-01 DIAGNOSIS — L989 Disorder of the skin and subcutaneous tissue, unspecified: Secondary | ICD-10-CM | POA: Diagnosis not present

## 2020-12-01 DIAGNOSIS — M79674 Pain in right toe(s): Secondary | ICD-10-CM

## 2020-12-01 DIAGNOSIS — M79675 Pain in left toe(s): Secondary | ICD-10-CM

## 2020-12-01 DIAGNOSIS — H903 Sensorineural hearing loss, bilateral: Secondary | ICD-10-CM | POA: Diagnosis not present

## 2020-12-01 DIAGNOSIS — B351 Tinea unguium: Secondary | ICD-10-CM

## 2020-12-01 MED ORDER — ESOMEPRAZOLE MAGNESIUM 40 MG PO CPDR: 40.0000 mg | DELAYED_RELEASE_CAPSULE | Freq: Every day | ORAL | 1 refills | Status: DC

## 2020-12-02 NOTE — Progress Notes (Signed)
    Subjective: Patient is a 70 y.o. female presenting to the office today for routine foot care.  Patient states that she is unable to trim her own nails and they are painful with shoe gear.  She also has symptomatic calluses that are painful with shoe gear as well.  She presents today for routine foot care  Past Medical History:  Diagnosis Date   Anemia    Anxiety    Arthritis    "knees" (04/26/2016)   Colon polyps    benign per pt   Coronary artery disease    mild per 2015 cath in [place] (OM1 30%, RCA 30%)   GERD (gastroesophageal reflux disease)    Gout    History of hiatal hernia    Hypertension    Migraine    "none since early /2017" (05/06/2016)   Obesity    OSA on CPAP    uses CPAP   Pre-diabetes     Objective:  Physical Exam General: Alert and oriented x3 in no acute distress  Dermatology: Hyperkeratotic lesions present on the bilateral feet x 4. Pain on palpation with a central nucleated core noted. Skin is warm, dry and supple bilateral lower extremities. Negative for open lesions or macerations. Nails are tender, long, thickened and dystrophic with subungual debris, consistent with onychomycosis, 1-5 bilateral. No signs of infection noted.  Vascular: Palpable pedal pulses bilaterally. No edema or erythema noted. Capillary refill within normal limits.  Neurological: Epicritic and protective threshold grossly intact bilaterally.   Musculoskeletal Exam: Pain on palpation at the keratotic lesions noted. Range of motion within normal limits bilateral. Muscle strength 5/5 in all groups bilateral.    Assessment: 1. Onychodystrophic nails 1-5 bilateral with hyperkeratosis of nails.  2. Onychomycosis of nail due to dermatophyte bilateral 3. Pre-ulcerative callus lesions noted to the bilateral feet x 4  Plan of Care:  1. Patient evaluated. 2. Excisional debridement of keratoic lesion using a chisel blade was performed without incident.  3. Dressed with light  dressing. 4. Mechanical debridement of nails 1-5 bilaterally performed using a nail nipper. Filed with dremel without incident.  5.  Continue OTC Motrin 800 mg as needed  6.  Recommend good supportive shoes that support the foot  7.  Patient is to return to the clinic in 3 months.   Edrick Kins, DPM [facility]  Dr. Edrick Kins, DPM    [address], [place] [address]                Office [phone]  Fax [phone]

## 2020-12-04 ENCOUNTER — Ambulatory Visit (HOSPITAL_BASED_OUTPATIENT_CLINIC_OR_DEPARTMENT_OTHER)
Admission: RE | Admit: 2020-12-04 | Discharge: 2020-12-04 | Disposition: A | Discharge status: Home / Self Care | Payer: Medicare HMO | Source: Ambulatory Visit | Attending: Family Medicine | Admitting: Family Medicine

## 2020-12-04 ENCOUNTER — Other Ambulatory Visit: Payer: Self-pay

## 2020-12-04 DIAGNOSIS — R413 Other amnesia: Secondary | ICD-10-CM | POA: Diagnosis not present

## 2020-12-04 DIAGNOSIS — I6782 Cerebral ischemia: Secondary | ICD-10-CM | POA: Diagnosis not present

## 2020-12-04 DIAGNOSIS — G319 Degenerative disease of nervous system, unspecified: Secondary | ICD-10-CM | POA: Diagnosis not present

## 2020-12-05 ENCOUNTER — Other Ambulatory Visit: Payer: Self-pay | Admitting: Family Medicine

## 2020-12-05 ENCOUNTER — Encounter: Payer: Self-pay | Admitting: Physician Assistant

## 2020-12-05 DIAGNOSIS — R413 Other amnesia: Secondary | ICD-10-CM

## 2020-12-10 DIAGNOSIS — R69 Illness, unspecified: Secondary | ICD-10-CM | POA: Diagnosis not present

## 2020-12-10 NOTE — Patient Instructions (Signed)
It was a pleasure to see you today at our office.   Recommendations: Iron studies . Will call you with the results  If the memory loss does not improve or gets worse, will proceed with Neurocognitive testing for a formal diagnosis  Follow up as needed  RECOMMENDATIONS FOR ALL PATIENTS WITH MEMORY PROBLEMS: 1. Continue to exercise (Recommend 30 minutes of walking everyday, or 3 hours every week) 2. Increase social interactions - continue going to [place] and enjoy social gatherings with friends and family 3. Eat healthy, avoid fried foods and eat more fruits and vegetables 4. Maintain adequate blood pressure, blood sugar, and blood cholesterol level. Reducing the risk of stroke and cardiovascular disease also helps promoting better memory. 5. Avoid stressful situations. Live a simple life and avoid aggravations. Organize your time and prepare for the next day in anticipation. 6. Sleep well, avoid any interruptions of sleep and avoid any distractions in the bedroom that may interfere with adequate sleep quality 7. Avoid sugar, avoid sweets as there is a strong link between excessive sugar intake, diabetes, and cognitive impairment We discussed the Mediterranean diet, which has been shown to help patients reduce the risk of progressive memory disorders and reduces cardiovascular risk. This includes eating fish, eat fruits and green leafy vegetables, nuts like almonds and hazelnuts, walnuts, and also use olive oil. Avoid fast foods and fried foods as much as possible. Avoid sweets and sugar as sugar use has been linked to worsening of memory function.  There is always a concern of gradual progression of memory problems. If this is the case, then we may need to adjust level of care according to patient needs. Support, both to the patient and caregiver, should then be put into place.   FALL PRECAUTIONS: Be cautious when walking. Scan the area for obstacles that may increase the risk of trips and falls.  When getting up in the mornings, sit up at the edge of the bed for a few minutes before getting out of bed. Consider elevating the bed at the head end to avoid drop of blood pressure when getting up. Walk always in a well-lit room (use night lights in the walls). Avoid area rugs or power cords from appliances in the middle of the walkways. Use a walker or a cane if necessary and consider physical therapy for balance exercise. Get your eyesight checked regularly.  FINANCIAL OVERSIGHT: Supervision, especially oversight when making financial decisions or transactions is also recommended.  HOME SAFETY: Consider the safety of the kitchen when operating appliances like stoves, microwave oven, and blender. Consider having supervision and share cooking responsibilities until no longer able to participate in those. Accidents with firearms and other hazards in the house should be identified and addressed as well.   ABILITY TO BE LEFT ALONE: If patient is unable to contact 911 operator, consider using LifeLine, or when the need is there, arrange for someone to stay with patients. Smoking is a fire hazard, consider supervision or cessation. Risk of wandering should be assessed by caregiver and if detected at any point, supervision and safe proof recommendations should be instituted.  MEDICATION SUPERVISION: Inability to self-administer medication needs to be constantly addressed. Implement a mechanism to ensure safe administration of the medications.       Mediterranean Diet A Mediterranean diet refers to food and lifestyle choices that are based on the traditions of countries located on the [employer]. This way of eating has been shown to help prevent certain conditions and improve  outcomes for people who have chronic diseases, like kidney disease and heart disease. What are tips for following this plan? Lifestyle  Cook and eat meals together with your family, when possible. Drink enough fluid to  keep your urine clear or pale yellow. Be physically active every day. This includes: Aerobic exercise like running or swimming. Leisure activities like gardening, walking, or housework. Get 7-8 hours of sleep each night. If recommended by your health care provider, drink red wine in moderation. This means 1 glass a day for nonpregnant women and 2 glasses a day for men. A glass of wine equals 5 oz (150 mL). Reading food labels  Check the serving size of packaged foods. For foods such as rice and pasta, the serving size refers to the amount of cooked product, not dry. Check the total fat in packaged foods. Avoid foods that have saturated fat or trans fats. Check the ingredients list for added sugars, such as corn syrup. Shopping  At the grocery store, buy most of your food from the areas near the walls of the store. This includes: Fresh fruits and vegetables (produce). Grains, beans, nuts, and seeds. Some of these may be available in unpackaged forms or large amounts (in bulk). Fresh seafood. Poultry and eggs. Low-fat dairy products. Buy whole ingredients instead of prepackaged foods. Buy fresh fruits and vegetables in-season from local farmers markets. Buy frozen fruits and vegetables in resealable bags. If you do not have access to quality fresh seafood, buy precooked frozen shrimp or canned fish, such as tuna, salmon, or sardines. Buy small amounts of raw or cooked vegetables, salads, or olives from the deli or salad bar at your store. Stock your pantry so you always have certain foods on hand, such as olive oil, canned tuna, canned tomatoes, rice, pasta, and beans. Cooking  Cook foods with extra-virgin olive oil instead of using butter or other vegetable oils. Have meat as a side dish, and have vegetables or grains as your main dish. This means having meat in small portions or adding small amounts of meat to foods like pasta or stew. Use beans or vegetables instead of meat in common  dishes like chili or lasagna. Experiment with different cooking methods. Try roasting or broiling vegetables instead of steaming or sauteing them. Add frozen vegetables to soups, stews, pasta, or rice. Add nuts or seeds for added healthy fat at each meal. You can add these to yogurt, salads, or vegetable dishes. Marinate fish or vegetables using olive oil, lemon juice, garlic, and fresh herbs. Meal planning  Plan to eat 1 vegetarian meal one day each week. Try to work up to 2 vegetarian meals, if possible. Eat seafood 2 or more times a week. Have healthy snacks readily available, such as: Vegetable sticks with hummus. Greek yogurt. Fruit and nut trail mix. Eat balanced meals throughout the week. This includes: Fruit: 2-3 servings a day Vegetables: 4-5 servings a day Low-fat dairy: 2 servings a day Fish, poultry, or lean meat: 1 serving a day Beans and legumes: 2 or more servings a week Nuts and seeds: 1-2 servings a day Whole grains: 6-8 servings a day Extra-virgin olive oil: 3-4 servings a day Limit red meat and sweets to only a few servings a month What are my food choices? Mediterranean diet Recommended Grains: Whole-grain pasta. Brown rice. Bulgar wheat. Polenta. Couscous. Whole-wheat bread. [place]. Vegetables: Artichokes. Beets. Broccoli. Cabbage. Carrots. Eggplant. Green beans. Chard. Kale. Spinach. Onions. Leeks. Peas. Squash. Tomatoes. Peppers. Radishes. Fruits: Apples. Apricots.  Avocado. Berries. Bananas. Cherries. Dates. Figs. Grapes. Lemons. Melon. Oranges. Peaches. Plums. Pomegranate. Meats and other protein foods: Beans. Almonds. Sunflower seeds. Pine nuts. Peanuts. [place]. Salmon. Scallops. Shrimp. [place]. Tilapia. Clams. Oysters. Eggs. Dairy: Low-fat milk. Cheese. Greek yogurt. Beverages: Water. Red wine. Herbal tea. Fats and oils: Extra virgin olive oil. Avocado oil. Grape seed oil. Sweets and desserts: [place] yogurt with honey. Baked apples. Poached pears. Trail  mix. Seasoning and other foods: Basil. Cilantro. Coriander. Cumin. Mint. Parsley. Sage. Rosemary. Tarragon. Garlic. Oregano. Thyme. Pepper. Balsalmic vinegar. Tahini. Hummus. Tomato sauce. Olives. Mushrooms. Limit these Grains: Prepackaged pasta or rice dishes. Prepackaged cereal with added sugar. Vegetables: Deep fried potatoes (french fries). Fruits: Fruit canned in syrup. Meats and other protein foods: Beef. Pork. Lamb. Poultry with skin. Hot dogs. [place] Salines. Dairy: Ice cream. Sour cream. Whole milk. Beverages: Juice. Sugar-sweetened soft drinks. Beer. Liquor and spirits. Fats and oils: Butter. Canola oil. Vegetable oil. Beef fat (tallow). Lard. Sweets and desserts: Cookies. Cakes. Pies. Candy. Seasoning and other foods: Mayonnaise. Premade sauces and marinades. The items listed may not be a complete list. Talk with your dietitian about what dietary choices are right for you. Summary The Mediterranean diet includes both food and lifestyle choices. Eat a variety of fresh fruits and vegetables, beans, nuts, seeds, and whole grains. Limit the amount of red meat and sweets that you eat. Talk with your health care provider about whether it is safe for you to drink red wine in moderation. This means 1 glass a day for nonpregnant women and 2 glasses a day for men. A glass of wine equals 5 oz (150 mL). This information is not intended to replace advice given to you by your health care provider. Make sure you discuss any questions you have with your health care provider. Document Released: 12/04/2015 Document Revised: 01/06/2016 Document Reviewed: 12/04/2015 Elsevier Interactive Patient Education  2017 [employer].

## 2020-12-10 NOTE — Progress Notes (Signed)
Assessment/Plan:   Christine [name] is a 70 y.o. year old female with risk factors including  hypertension, hyperlipidemia, OSA on CPAP, prediabetes, anxiety, depression and  seen today for evaluation of memory loss. CT head without contrast 11/24/20 shows mild generalized cerebral atrophy and mild chronic ischemic changes within the cerebral white matter. MoCA today is 28/30 (= MMSE 30 ) with  deficiencies delayed recall  3/5; orientation normal  6/6 . History of anemia with most recent Hb 9.3/ Hct 29.1. B12 467.   Recommendations:   Memory Difficulties Check Iron studies  Discussed the importance of regular daily schedule with inclusion of crossword puzzles to maintain brain function.  Continue to monitor mood with PCP and continue Psychological counseling  Stay active at least 30 minutes at least 3 times a week.  Naps should be scheduled and should be no longer than 60 minutes and should not occur after 2 PM.  Mediterranean diet is recommended  Wear CPAP Continue audiologist appointment for hearing evaluation, rule out hearing loss  Folllow up as needed. If symptoms worsen, a Neuropsych evaluation will be arranged to evaluate other causes of memory loss including depression, anxiety.   Subjective:    The patient is seen in neurologic consultation at the request of Shelda Pal* for the evaluation of memory difficulties.  The patient is here alone. She is a very pleasant 70 y.o. year old female who has noted difficulty since retiring 2 years ago from [employer].  She states as her mind began to be less busier, she has noticed that she has to write appointments and other events down to not forget.  She states that until then, she may have had things on "the tip of the tongue, but they were never as trouble some months now ".  Yesterday, while purchasing something online, instead of typing 2022, she typed the year 1924, which concerned her.  She has some  trouble remembering names as well.  She denies asking the same questions or repeating the same stories.  The patient lives alone, and reports no one noticing any changes.  Her mood is overall stable, although she does admit that after retiring, she had more time to think about remote events with her family members, which brings her to anger and sadness, especially the "no relationship with mother ".  She also has a history of anxiety, and she is on therapy which appears to help her emotionally.  She tries to remain busy, especially knitting baby blankets, and doing sudoku, or browsing the computer and reading. She sleeps well, "even with the CPAP, which looks like the doctor is going to discontinue because I am doing well ".  She has vivid dreams, and denies sleepwalking, hallucinations, paranoia, or leaving objects in unusual places.  She denies any head injuries, or falls.  She ambulates without difficulty without the use of a walker or a cane.  She is independent of bathing and dressing.  At times, she forgets to take her medications if she does not placed in on the table as usual.  She denies missing any payments.  Appetite is good, denies trouble swallowing, she cooks and denies leaving the stove or the faucet on.  She continues to drive, and denies getting lost.  Denies headaches, double vision, dizziness, focal numbness or tingling, unilateral weakness or tremors. 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  She has known urge and stress urine incontinence. Denies constipation or diarrhea at this time. Recent episode of diarrhea in early August , after having the severe argument with a family member, and developed "a nervous stomach ", she was seen at the ED,  but no formal diagnosis was found but this" led to an episode of bout of gout" which is now resolved. denies anosmia. Denies history of ETOH or Tobacco. Family  History Mo with "memory issues"   Current labs 11/17/20  Total  cholesterol 204, HDL 62.2, LDL 116, triglycerides 127, VLDL 25.4 Vitamin B12 467 Hemoglobin 9.3, with hematocrit 29.1, MCV 85.7 Hemoglobin A1c 6.2 TSH 2.92, with T4 0.8 CMP with a creatinine of 1.13 otherwise unremarkable Mg 1.7   Allergies  Allergen Reactions   Prednisone Swelling    Legs swell    Tape    Sulfa Antibiotics Rash    Current Outpatient Medications  Medication Instructions   acetaminophen (TYLENOL) 650 mg, Oral, Every 6 hours PRN   aspirin 81 MG EC tablet Oral   DULoxetine (CYMBALTA) 60 MG capsule TAKE ONE CAPSULE BY MOUTH DAILY   esomeprazole (NEXIUM) 40 mg, Oral, Daily   fexofenadine (ALLEGRA) 180 mg, Oral, Daily   ibuprofen (ADVIL) 800 MG tablet TAKE 1 TABLET BY MOUTH EVERY 8 HOURS AS NEEDED   iron polysaccharides (NIFEREX) 150 mg, Oral, Daily   metoprolol tartrate (LOPRESSOR) 50 MG tablet TAKE 1 TABLET BY MOUTH EVERY DAY   oxybutynin (DITROPAN XL) 15 mg, Oral, Daily at bedtime   rosuvastatin (CRESTOR) 10 mg, Oral, Daily   triamterene-hydrochlorothiazide (MAXZIDE) 75-50 MG tablet 1 tablet, Oral, Daily     VITALS:   Vitals:   12/11/20 1000  BP: 137/84  Pulse: 95  SpO2: 95%  Weight: 275 lb 12.8 oz (125.1 kg)  Height: 5' 1.5" (1.562 m)   Depression screen [facility] 2/9 08/29/2020  Decreased Interest 0  Down, Depressed, Hopeless 0  PHQ - 2 Score 0    PHYSICAL EXAM   HEENT:  Normocephalic, atraumatic. The mucous membranes are moist. The superficial temporal arteries are without ropiness or tenderness. Cardiovascular: Regular rate and rhythm. Lungs: Clear to auscultation bilaterally. Neck: There are no carotid bruits noted bilaterally.  NEUROLOGICAL: Montreal Cognitive Assessment  12/11/2020  Visuospatial/ Executive (0/5) 5  Naming (0/3) 2  Attention: Read list of digits (0/2) 2  Attention: Read list of letters (0/1) 1  Attention: Serial 7 subtraction starting at 100 (0/3) 3  Language:  Repeat phrase (0/2) 2  Language : Fluency (0/1) 1  Abstraction (0/2) 2  Delayed Recall (0/5) 3  Orientation (0/6) 6  Total 27  Adjusted Score (based on education) 28   No flowsheet data found.  No flowsheet data found.   Orientation:  Alert and oriented to person, place and time. No aphasia or dysarthria. Fund of knowledge is appropriate. Recent memory and remote memory intact.  Attention and concentration are normal.  Able to name objects and repeat phrases. Delayed recall  3/5, retrieved on category cue Cranial nerves: There is good facial symmetry. Extraocular muscles are intact and visual fields are full to confrontational testing. Speech is fluent and clear. Soft palate rises symmetrically and there is no tongue deviation. Hearing is intact to conversational tone. Tone: Tone is good throughout. Sensation: Sensation is intact to light touch and pinprick throughout. Vibration is intact at the bilateral big toe.There is no extinction with double simultaneous stimulation. There is no sensory dermatomal level identified. Coordination: The patient has no difficulty  with RAM's or FNF bilaterally. Normal finger to nose  Motor: Strength is 5/5 in the bilateral upper and lower extremities. There is no pronator drift. There are no fasciculations noted. DTR's: Deep tendon reflexes are 2/4 at the bilateral biceps, triceps, brachioradialis, patella and achilles.  Plantar responses are downgoing bilaterally. Gait and Station: The patient is able to ambulate without difficulty.The patient is able to heel toe walk without any difficulty.The patient is able to ambulate in a tandem fashion. The patient is able to stand in the Romberg position.     Thank you for allowing [place] the opportunity to participate in the care of this nice patient. Please do not hesitate to contact us for any questions or concerns.   Total time spent on today's visit was 60 minutes, including both face-to-face time and nonface-to-face  time.  Time included that spent on review of records (prior notes available to me/labs/imaging if pertinent), discussing treatment and goals, answering patient's questions and coordinating care.  Cc:  Shelda Pal, DO  [facility] 12/11/2020 11:40 AM

## 2020-12-11 ENCOUNTER — Ambulatory Visit: Payer: Medicare HMO | Admitting: Physician Assistant

## 2020-12-11 ENCOUNTER — Other Ambulatory Visit: Payer: Self-pay

## 2020-12-11 ENCOUNTER — Encounter: Payer: Self-pay | Admitting: Physician Assistant

## 2020-12-11 ENCOUNTER — Other Ambulatory Visit (INDEPENDENT_AMBULATORY_CARE_PROVIDER_SITE_OTHER): Payer: Medicare HMO

## 2020-12-11 VITALS — BP 137/84 | HR 95 | Ht 61.5 in | Wt 275.8 lb

## 2020-12-11 DIAGNOSIS — R413 Other amnesia: Secondary | ICD-10-CM

## 2020-12-11 DIAGNOSIS — R799 Abnormal finding of blood chemistry, unspecified: Secondary | ICD-10-CM | POA: Diagnosis not present

## 2020-12-11 LAB — FOLATE: Folate: 6.6 ng/mL (ref >5.9)

## 2020-12-11 LAB — FERRITIN: Ferritin: 105.6 ng/mL (ref 10.0–291.0)

## 2020-12-11 LAB — VITAMIN B12: Vitamin B-12: 598 pg/mL (ref 211–911)

## 2020-12-12 LAB — IRON AND TIBC
Iron Saturation: 11 % — ABNORMAL LOW (ref 15–55)
Iron: 31 ug/dL (ref 27–139)
Total Iron Binding Capacity: 286 ug/dL (ref 250–450)
UIBC: 255 ug/dL (ref 118–369)

## 2020-12-14 DIAGNOSIS — G4733 Obstructive sleep apnea (adult) (pediatric): Secondary | ICD-10-CM | POA: Diagnosis not present

## 2020-12-16 ENCOUNTER — Ambulatory Visit (INDEPENDENT_AMBULATORY_CARE_PROVIDER_SITE_OTHER): Payer: Medicare HMO | Admitting: Family Medicine

## 2020-12-16 ENCOUNTER — Ambulatory Visit: Payer: Self-pay

## 2020-12-16 ENCOUNTER — Encounter: Payer: Self-pay | Admitting: Family Medicine

## 2020-12-16 VITALS — BP 158/84 | Ht 61.5 in | Wt 275.0 lb

## 2020-12-16 DIAGNOSIS — M65352 Trigger finger, left little finger: Secondary | ICD-10-CM | POA: Diagnosis not present

## 2020-12-16 DIAGNOSIS — M7702 Medial epicondylitis, left elbow: Secondary | ICD-10-CM

## 2020-12-16 MED ORDER — TRIAMCINOLONE ACETONIDE 40 MG/ML IJ SUSP
40.0000 mg | Freq: Once | INTRAMUSCULAR | Status: AC
  Administered 2020-12-16: 40 mg via INTRA_ARTICULAR

## 2020-12-16 NOTE — Progress Notes (Signed)
Christine [name] - 70 y.o. female MRN [id number]  Date of birth: [birth year]  SUBJECTIVE:  Including CC & ROS.  No chief complaint on file.   Christine [name] is a 70 y.o. female that is presenting with triggering of the left pinky finger.  Has been ongoing for years.  Seem to gotten worse recently.  She is also having medial sided elbow pain.  Denies any specific repetitive motions.   Review of Systems See HPI   HISTORY: Past Medical, Surgical, Social, and Family History Reviewed & Updated per EMR.   Pertinent Historical Findings include:  Past Medical History:  Diagnosis Date   Anemia    Anxiety    Arthritis    "knees" (04/26/2016)   Colon polyps    benign per pt   Coronary artery disease    mild per 2015 cath in [place] (OM1 30%, RCA 30%)   GERD (gastroesophageal reflux disease)    Gout    History of hiatal hernia    Hypertension    Migraine    "none since early /2017" (05/06/2016)   Obesity    OSA on CPAP    uses CPAP   Pre-diabetes     Past Surgical History:  Procedure Laterality Date   ABDOMINAL HYSTERECTOMY     "partial"; both ovaries present   CARDIAC CATHETERIZATION  ~ 2015   CARDIAC CATHETERIZATION  2015   In [place] Right 06/01/2017   Procedure: [place];  Surgeon: Edrick Kins, DPM;  Location: [facility];  Service: Podiatry;  Laterality: Right;   LAPAROSCOPIC APPENDECTOMY N/A 05/06/2016   Procedure: LAPAROSCOPIC APPENDECTOMY;  Surgeon: Donnie Mesa, MD;  Location[facility];  Service: General;  Laterality: N/A;   TONSILLECTOMY      Family History  Problem Relation Age of Onset   Diabetes Mother    Heart disease Mother    High blood pressure Mother    Asthma Mother    Diabetes Father    Heart disease Father    High blood pressure Father    Asthma Father    Diabetes Sister    Colon cancer Neg Hx    Esophageal cancer Neg Hx    Rectal cancer Neg Hx     Social History   Socioeconomic History   Marital status: Single    Spouse  name: Not on file   Number of children: 0   Years of education: Not on file   Highest education level: Not on file  Occupational History   Occupation: retired  Tobacco Use   Smoking status: Former    Packs/day: 0.12    Years: 5.00    Pack years: 0.60    Types: Cigarettes    Quit date: 1980    Years since quitting: 42.6   Smokeless tobacco: Never  Vaping Use   Vaping Use: Never used  Substance and Sexual Activity   Alcohol use: No   Drug use: No   Sexual activity: Never  Other Topics Concern   Not on file  Social History Narrative   Right handed    Lives alone with dog    Social Determinants of Health   Financial Resource Strain: Not on file  Food Insecurity: Not on file  Transportation Needs: Not on file  Physical Activity: Not on file  Stress: Not on file  Social Connections: Not on file  Intimate Partner Violence: Not on file     PHYSICAL EXAM:  VS: BP (!) 158/84 (BP Location: Left Arm,  Patient Position: Sitting, Cuff Size: Large)   Ht 5' 1.5" (1.562 m)   Wt 275 lb (124.7 kg)   BMI 51.12 kg/m  Physical Exam Gen: NAD, alert, cooperative with exam, well-appearing MSK:  Left hand: Triggering evident of the pinky finger. No swelling or ecchymosis. Neurovascular intact  Limited ultrasound: Left pinky finger:  Appreciated at the A1 pulley  Summary: Trigger finger of the fifth digit  Ultrasound and interpretation by Clearance Coots, MD   Aspiration/Injection Procedure Note Christine [name] Apr 06, 1951  Procedure: Injection Indications: Left pinky finger trigger finger  Procedure Details Consent: Risks of procedure as well as the alternatives and risks of each were explained to the (patient/caregiver).  Consent for procedure obtained. Time Out: Verified patient identification, verified procedure, site/side was marked, verified correct patient position, special equipment/implants available, medications/allergies/relevent history reviewed, required imaging  and test results available.  Performed.  The area was cleaned with iodine and alcohol swabs.    The left fifth digit was injected using 1 cc's of 40 mg Kenalog and 1 cc's of 0.25% bupivacaine with a 25 1 1/2" needle.  Ultrasound was used. Images were obtained in long views showing the injection.     A sterile dressing was applied.  Patient did tolerate procedure well.    ASSESSMENT & PLAN:   Medial epicondylitis of elbow, left Appears to have pain at the common flexor origin. -Counseled on home exercise therapy and supportive care. -Pennsaid samples. -Could consider shockwave or physical therapy.  Trigger little finger of left hand Triggering is acute on chronic in nature. -Counseled on splinting. -Injection today.

## 2020-12-16 NOTE — Patient Instructions (Addendum)
Good to see you Please try to splint at night  Please try the rub on the the elbow  Please try the exercises  Please use ice on the elbow as needed  Please send me a message in MyChart with any questions or updates.  Please see me back in 4 weeks.   --Dr. Raeford Razor

## 2020-12-16 NOTE — Assessment & Plan Note (Signed)
Appears to have pain at the common flexor origin. -Counseled on home exercise therapy and supportive care. -Pennsaid samples. -Could consider shockwave or physical therapy.

## 2020-12-16 NOTE — Assessment & Plan Note (Signed)
Triggering is acute on chronic in nature. -Counseled on splinting. -Injection today.

## 2020-12-24 ENCOUNTER — Ambulatory Visit (HOSPITAL_BASED_OUTPATIENT_CLINIC_OR_DEPARTMENT_OTHER)
Admission: RE | Admit: 2020-12-24 | Discharge: 2020-12-24 | Disposition: A | Discharge status: Home / Self Care | Payer: Medicare HMO | Source: Ambulatory Visit | Attending: Family Medicine | Admitting: Family Medicine

## 2020-12-24 ENCOUNTER — Encounter: Payer: Self-pay | Admitting: Family Medicine

## 2020-12-24 ENCOUNTER — Other Ambulatory Visit: Payer: Self-pay

## 2020-12-24 ENCOUNTER — Ambulatory Visit (INDEPENDENT_AMBULATORY_CARE_PROVIDER_SITE_OTHER): Payer: Medicare HMO | Admitting: Family Medicine

## 2020-12-24 VITALS — Ht 61.5 in | Wt 275.0 lb

## 2020-12-24 DIAGNOSIS — M545 Low back pain, unspecified: Secondary | ICD-10-CM | POA: Diagnosis not present

## 2020-12-24 DIAGNOSIS — M25551 Pain in right hip: Secondary | ICD-10-CM | POA: Diagnosis not present

## 2020-12-24 DIAGNOSIS — M5416 Radiculopathy, lumbar region: Secondary | ICD-10-CM | POA: Insufficient documentation

## 2020-12-24 DIAGNOSIS — R269 Unspecified abnormalities of gait and mobility: Secondary | ICD-10-CM | POA: Diagnosis not present

## 2020-12-24 MED ORDER — HYDROCODONE-ACETAMINOPHEN 5-325 MG PO TABS: 1.0000 | ORAL_TABLET | Freq: Three times a day (TID) | ORAL | 0 refills | Status: DC | PRN

## 2020-12-24 MED ORDER — KETOROLAC TROMETHAMINE 30 MG/ML IJ SOLN
30.0000 mg | Freq: Once | INTRAMUSCULAR | Status: AC
  Administered 2020-12-24: 30 mg via INTRAMUSCULAR

## 2020-12-24 NOTE — Assessment & Plan Note (Signed)
Symptoms seem more consistent with a nerve irritation given severity of pain and normal exam. -Counseled on home exercise therapy and supportive care. -IM Toradol. -Hip and back x-ray: -Norco -Could consider physical therapy

## 2020-12-24 NOTE — Progress Notes (Signed)
Christine [name] - 70 y.o. female MRN [id number]  Date of birth: 07-19-1950  SUBJECTIVE:  Including CC & ROS.  No chief complaint on file.   Christine [name] is a 70 y.o. female that is presenting with acute posterior right hip pain.  She woke up 1 morning with severe pain.  No history of hip dislocation.  She having severe pain with any ambulation or weightbearing.  Has been taking ibuprofen with no improvement..    Review of Systems See HPI   HISTORY: Past Medical, Surgical, Social, and Family History Reviewed & Updated per EMR.   Pertinent Historical Findings include:  Past Medical History:  Diagnosis Date   Anemia    Anxiety    Arthritis    "knees" (04/26/2016)   Colon polyps    benign per pt   Coronary artery disease    mild per 2015 cath in [place] (OM1 30%, RCA 30%)   GERD (gastroesophageal reflux disease)    Gout    History of hiatal hernia    Hypertension    Migraine    "none since early /2017" (05/06/2016)   Obesity    OSA on CPAP    uses CPAP   Pre-diabetes     Past Surgical History:  Procedure Laterality Date   ABDOMINAL HYSTERECTOMY     "partial"; both ovaries present   CARDIAC CATHETERIZATION  ~ 2015   CARDIAC CATHETERIZATION  2015   In [place] Right 06/01/2017   Procedure: [place];  Surgeon: Edrick Kins, DPM;  Location: [facility];  Service: Podiatry;  Laterality: Right;   LAPAROSCOPIC APPENDECTOMY N/A 05/06/2016   Procedure: LAPAROSCOPIC APPENDECTOMY;  Surgeon: Donnie Mesa, MD;  Location: [facility];  Service: General;  Laterality: N/A;   TONSILLECTOMY      Family History  Problem Relation Age of Onset   Diabetes Mother    Heart disease Mother    High blood pressure Mother    Asthma Mother    Diabetes Father    Heart disease Father    High blood pressure Father    Asthma Father    Diabetes Sister    Colon cancer Neg Hx    Esophageal cancer Neg Hx    Rectal cancer Neg Hx     Social History   Socioeconomic History    Marital status: Single    Spouse name: Not on file   Number of children: 0   Years of education: Not on file   Highest education level: Not on file  Occupational History   Occupation: retired  Tobacco Use   Smoking status: Former    Packs/day: 0.12    Years: 5.00    Pack years: 0.60    Types: Cigarettes    Quit date: 1980    Years since quitting: 42.6   Smokeless tobacco: Never  Vaping Use   Vaping Use: Never used  Substance and Sexual Activity   Alcohol use: No   Drug use: No   Sexual activity: Never  Other Topics Concern   Not on file  Social History Narrative   Right handed    Lives alone with dog    Social Determinants of Health   Financial Resource Strain: Not on file  Food Insecurity: Not on file  Transportation Needs: Not on file  Physical Activity: Not on file  Stress: Not on file  Social Connections: Not on file  Intimate Partner Violence: Not on file     PHYSICAL EXAM:  VS: Ht  5' 1.5" (1.562 m)   Wt 275 lb (124.7 kg)   BMI 51.12 kg/m  Physical Exam Gen: NAD, alert, cooperative with exam, well-appearing MSK:  Right hip: Normal range of motion. Able to extend and flex the hip. Pain with left leg extension. Neurovascular intact     ASSESSMENT & PLAN:   Lumbar radiculopathy Symptoms seem more consistent with a nerve irritation given severity of pain and normal exam. -Counseled on home exercise therapy and supportive care. -IM Toradol. -Hip and back x-ray: -Norco -Could consider physical therapy

## 2020-12-24 NOTE — Patient Instructions (Signed)
Good to see you I will call with the xray results.  Please try heat  Please try the exercises   Please send me a message in MyChart with any questions or updates.  Please see me back in 1-2 weeks.   --Dr. Raeford Razor

## 2020-12-25 ENCOUNTER — Emergency Department (HOSPITAL_COMMUNITY)
Admission: EM | Admit: 2020-12-25 | Discharge: 2020-12-25 | Disposition: A | Discharge status: Home / Self Care | Payer: Medicare HMO | Attending: Emergency Medicine | Admitting: Emergency Medicine

## 2020-12-25 ENCOUNTER — Other Ambulatory Visit: Payer: Self-pay

## 2020-12-25 ENCOUNTER — Encounter (HOSPITAL_COMMUNITY): Payer: Self-pay | Admitting: Emergency Medicine

## 2020-12-25 DIAGNOSIS — I2510 Atherosclerotic heart disease of native coronary artery without angina pectoris: Secondary | ICD-10-CM | POA: Insufficient documentation

## 2020-12-25 DIAGNOSIS — X58XXXA Exposure to other specified factors, initial encounter: Secondary | ICD-10-CM | POA: Diagnosis not present

## 2020-12-25 DIAGNOSIS — Z86018 Personal history of other benign neoplasm: Secondary | ICD-10-CM | POA: Insufficient documentation

## 2020-12-25 DIAGNOSIS — Z743 Need for continuous supervision: Secondary | ICD-10-CM | POA: Diagnosis not present

## 2020-12-25 DIAGNOSIS — Z79899 Other long term (current) drug therapy: Secondary | ICD-10-CM | POA: Diagnosis not present

## 2020-12-25 DIAGNOSIS — M549 Dorsalgia, unspecified: Secondary | ICD-10-CM | POA: Diagnosis not present

## 2020-12-25 DIAGNOSIS — Z7982 Long term (current) use of aspirin: Secondary | ICD-10-CM | POA: Diagnosis not present

## 2020-12-25 DIAGNOSIS — I10 Essential (primary) hypertension: Secondary | ICD-10-CM | POA: Diagnosis not present

## 2020-12-25 DIAGNOSIS — M25551 Pain in right hip: Secondary | ICD-10-CM | POA: Insufficient documentation

## 2020-12-25 DIAGNOSIS — S39012A Strain of muscle, fascia and tendon of lower back, initial encounter: Secondary | ICD-10-CM | POA: Diagnosis not present

## 2020-12-25 DIAGNOSIS — Z87891 Personal history of nicotine dependence: Secondary | ICD-10-CM | POA: Insufficient documentation

## 2020-12-25 DIAGNOSIS — S3992XA Unspecified injury of lower back, initial encounter: Secondary | ICD-10-CM | POA: Diagnosis present

## 2020-12-25 MED ORDER — HYDROMORPHONE HCL 1 MG/ML IJ SOLN
1.0000 mg | Freq: Once | INTRAMUSCULAR | Status: AC
  Administered 2020-12-25: 1 mg via INTRAMUSCULAR
  Filled 2020-12-25: qty 1

## 2020-12-25 MED ORDER — DIAZEPAM 5 MG PO TABS
5.0000 mg | ORAL_TABLET | Freq: Once | ORAL | Status: AC
  Administered 2020-12-25: 5 mg via ORAL
  Filled 2020-12-25: qty 1

## 2020-12-25 MED ORDER — OXYCODONE-ACETAMINOPHEN 5-325 MG PO TABS: 1.0000 | ORAL_TABLET | Freq: Four times a day (QID) | ORAL | 0 refills | Status: DC | PRN

## 2020-12-25 MED ORDER — DIAZEPAM 5 MG PO TABS: 5.0000 mg | ORAL_TABLET | Freq: Two times a day (BID) | ORAL | 0 refills | Status: DC

## 2020-12-25 NOTE — ED Provider Notes (Signed)
Emergency Medicine Provider Triage Evaluation Note  Christine [name] , a 70 y.o. female  was evaluated in triage.  Pt complains of right-sided low back pain that radiates down right lower extremity directly above the knee.  No history of chronic low back pain.  Denies saddle paresthesias, bowel/bladder incontinence, lower extremity numbness/tingling, lower extremity weakness, IV drug use, fever/chills, and history of cancer.  Patient evaluated by Dr. Raeford Razor with sports medicine yesterday where x-rays were performed (results unavailable). Patient given IM Toradol.  Patient states pain is worse with ambulation. No known trauma.  Review of Systems  Positive: Back pain Negative: fever  Physical Exam  BP (!) 144/81 (BP Location: Left Arm)   Pulse 85   Temp 98.9 F (37.2 C) (Oral)   Resp 16   SpO2 100%  Gen:   Awake, no distress   Resp:  Normal effort  MSK:   Moves extremities without difficulty  Other:    Medical Decision Making  Medically screening exam initiated at 3:01 PM.  Appropriate orders placed.  Christine [name] was informed that the remainder of the evaluation will be completed by another provider, this initial triage assessment does not replace that evaluation, and the importance of remaining in the ED until their evaluation is complete.     Suzy Bouchard, PA-C 12/25/20 1504    Carmin Muskrat, MD 12/25/20 [phone]

## 2020-12-25 NOTE — Discharge Instructions (Addendum)
Stop the Norco and take the Percocet as needed for pain.

## 2020-12-25 NOTE — ED Notes (Signed)
Pt unable to ambulate to the restroom without assistance. Needed wheelchair to make it to the restroom at this time

## 2020-12-25 NOTE — ED Provider Notes (Signed)
[name] Provider Note   CSN: [id number] Arrival date & time: 12/25/20  1450     History Chief Complaint  Patient presents with   Back Pain    Christine [name] is a 70 y.o. female.  Pt presents to the ED today with back pain.  Pt said she has a hx of chronic low back and right hip pain.  It usually is not too bad unless she sleeps on it wrong.  She did this a few days ago and has had a lot of pain since then.  She is having difficulty walking due to the pain, but is moving her legs and denies any numbness.  She did see Dr. Raeford Razor (sports medicine) yesterday and was given IM Toradol and a rx for Norco.  X-rays obtained.  Pt said the norco sometimes works and sometimes does not.  She said she usually needs something stronger.  She said the pain was worse this am.        Past Medical History:  Diagnosis Date   Anemia    Anxiety    Arthritis    "knees" (04/26/2016)   Colon polyps    benign per pt   Coronary artery disease    mild per 2015 cath in [place] (OM1 30%, RCA 30%)   GERD (gastroesophageal reflux disease)    Gout    History of hiatal hernia    Hypertension    Migraine    "none since early /2017" (05/06/2016)   Obesity    OSA on CPAP    uses CPAP   Pre-diabetes     Patient Active Problem List   Diagnosis Date Noted   Lumbar radiculopathy 12/24/2020   Trigger little finger of left hand 12/16/2020   Medial epicondylitis of elbow, left 12/16/2020   Memory difficulties 12/11/2020   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   Globus sensation 11/15/2019   Laryngopharyngeal reflux (LPR) 11/15/2019   Throat clearing 10/05/2019   Gout 08/31/2019   Phlebitis 08/31/2019   Prediabetes 08/31/2019   OA (osteoarthritis) of knee 05/17/2019   Acute pain of right knee 05/17/2019   Abdominal pain 03/31/2019   Morbid obesity ([place]) 03/29/2018   GAD (generalized anxiety disorder) 02/27/2018   Mixed incontinence urge and  stress 02/27/2018   OSA on CPAP 06/16/2017   Acute appendicitis 05/06/2016   Gastro-esophageal reflux disease with esophagitis 07/26/2011   Essential hypertension 07/26/2011   Osteoarthrosis 10/16/2009   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, including shoulder 08/22/2008   Pre-operative cardiovascular examination 08/22/2008   Anxiety 04/27/2003    Past Surgical History:  Procedure Laterality Date   ABDOMINAL HYSTERECTOMY     "partial"; both ovaries present   CARDIAC CATHETERIZATION  ~ 2015   CARDIAC CATHETERIZATION  2015   In [place] Right 06/01/2017   Procedure: CHILECTOMY RIGHT FOOT;  Surgeon: Edrick Kins, DPM;  Location: [facility];  Service: Podiatry;  Laterality: Right;   LAPAROSCOPIC APPENDECTOMY N/A 05/06/2016   Procedure: LAPAROSCOPIC APPENDECTOMY;  Surgeon: Donnie Mesa, MD;  Location: [facility];  Service: General;  Laterality: N/A;   TONSILLECTOMY       OB History     Gravida  0   Para  0   Term  0   Preterm  0   AB  0   Living  0      SAB  0   IAB  0   Ectopic  0   Multiple  0   Live Births  0           Family History  Problem Relation Age of Onset   Diabetes Mother    Heart disease Mother    High blood pressure Mother    Asthma Mother    Diabetes Father    Heart disease Father    High blood pressure Father    Asthma Father    Diabetes Sister    Colon cancer Neg Hx    Esophageal cancer Neg Hx    Rectal cancer Neg Hx     Social History   Tobacco Use   Smoking status: Former    Packs/day: 0.12    Years: 5.00    Pack years: 0.60    Types: Cigarettes    Quit date: 1980    Years since quitting: 42.6   Smokeless tobacco: Never  Vaping Use   Vaping Use: Never used  Substance Use Topics   Alcohol use: No   Drug use: No    Home Medications Prior to Admission medications   Medication Sig Start Date End Date Taking? Authorizing Provider  diazepam (VALIUM) 5 MG tablet Take 1 tablet (5  mg total) by mouth 2 (two) times daily. 12/25/20  Yes Isla Pence, MD  oxyCODONE-acetaminophen (PERCOCET/ROXICET) 5-325 MG tablet Take 1 tablet by mouth every 6 (six) hours as needed for severe pain. 12/25/20  Yes Isla Pence, MD  aspirin 81 MG EC tablet Take by mouth.    [provider]  DULoxetine (CYMBALTA) 60 MG capsule TAKE ONE CAPSULE BY MOUTH DAILY 10/14/20   Nani Ravens, Crosby Oyster, DO  esomeprazole (NEXIUM) 40 MG capsule Take 1 capsule (40 mg total) by mouth daily. 12/01/20   Shelda Pal, DO  fexofenadine (ALLEGRA) 180 MG tablet Take 180 mg by mouth daily.    [provider]  ibuprofen (ADVIL) 800 MG tablet TAKE 1 TABLET BY MOUTH EVERY 8 HOURS AS NEEDED 11/06/20   Nani Ravens, Crosby Oyster, DO  iron polysaccharides (NIFEREX) 150 MG capsule Take 150 mg by mouth daily.    [provider]  metoprolol tartrate (LOPRESSOR) 50 MG tablet TAKE 1 TABLET BY MOUTH EVERY DAY 09/15/20   Wendling, Crosby Oyster, DO  oxybutynin (DITROPAN XL) 15 MG 24 hr tablet Take 1 tablet (15 mg total) by mouth at bedtime. 08/29/20   Shelda Pal, DO  rosuvastatin (CRESTOR) 10 MG tablet Take 1 tablet (10 mg total) by mouth daily. 11/18/20   Shelda Pal, DO  triamterene-hydrochlorothiazide (MAXZIDE) 75-50 MG tablet Take 1 tablet by mouth daily. 04/11/20   Shelda Pal, DO    Allergies    Prednisone, Tape, and Sulfa antibiotics  Review of Systems   Review of Systems  Musculoskeletal:  Positive for back pain.  All other systems reviewed and are negative.  Physical Exam Updated Vital Signs BP (!) 145/77   Pulse 74   Temp 97.8 F (36.6 C)   Resp 16   SpO2 100%   Physical Exam Vitals and nursing note reviewed.  Constitutional:      Appearance: Normal appearance. She is obese.  HENT:     Head: Normocephalic and atraumatic.     Right Ear: External ear normal.     Left Ear: External ear normal.     Nose: Nose normal.     Mouth/Throat:      Mouth: Mucous membranes are moist.     Pharynx: Oropharynx is clear.  Eyes:  Extraocular Movements: Extraocular movements intact.     Conjunctiva/sclera: Conjunctivae normal.     Pupils: Pupils are equal, round, and reactive to light.  Cardiovascular:     Rate and Rhythm: Normal rate and regular rhythm.     Pulses: Normal pulses.     Heart sounds: Normal heart sounds.  Pulmonary:     Effort: Pulmonary effort is normal.     Breath sounds: Normal breath sounds.  Abdominal:     General: Abdomen is flat. Bowel sounds are normal.     Palpations: Abdomen is soft.  Musculoskeletal:        General: Normal range of motion.     Cervical back: Normal range of motion and neck supple.       Back:  Skin:    General: Skin is warm.     Capillary Refill: Capillary refill takes less than 2 seconds.  Neurological:     General: No focal deficit present.     Mental Status: She is alert and oriented to person, place, and time.  Psychiatric:        Mood and Affect: Mood normal.        Behavior: Behavior normal.    ED Results / Procedures / Treatments   Labs (all labs ordered are listed, but only abnormal results are displayed) Labs Reviewed - No data to display  EKG None  Radiology No results found.  Procedures Procedures   Medications Ordered in ED Medications  diazepam (VALIUM) tablet 5 mg (5 mg Oral Given 12/25/20 1748)  HYDROmorphone (DILAUDID) injection 1 mg (1 mg Intramuscular Given 12/25/20 1749)    ED Course  I have reviewed the triage vital signs and the nursing notes.  Pertinent labs & imaging results that were available during my care of the patient were reviewed by me and considered in my medical decision making (see chart for details).    MDM Rules/Calculators/A&P                           Pt is feeling better after her meds.  When she got up to use the restroom, she needed the wheelchair.  However, she figured out how to walk with her cane on the way back.  She  does not want anything else for pain here.  She is ready to go home.  She is to return if worse. F/u with pcp. Final Clinical Impression(s) / ED Diagnoses Final diagnoses:  Strain of lumbar region, initial encounter    Rx / DC Orders ED Discharge Orders          Ordered    oxyCODONE-acetaminophen (PERCOCET/ROXICET) 5-325 MG tablet  Every 6 hours PRN        12/25/20 1955    diazepam (VALIUM) 5 MG tablet  2 times daily        12/25/20 1955             Isla Pence, MD 12/25/20 [phone]

## 2020-12-25 NOTE — ED Notes (Signed)
Received verbal report from [place] at this time

## 2020-12-25 NOTE — ED Triage Notes (Addendum)
Patient BIB [facility] from home for evaluation of back pain x7 days. Denies other complaints. Patient ambulated at home with [facility]. Patient denies recent falls, states she believes the pain is the result of sleeping in a position that caused the pain.

## 2020-12-26 DIAGNOSIS — F411 Generalized anxiety disorder: Secondary | ICD-10-CM | POA: Diagnosis not present

## 2020-12-26 DIAGNOSIS — R69 Illness, unspecified: Secondary | ICD-10-CM | POA: Diagnosis not present

## 2020-12-30 ENCOUNTER — Other Ambulatory Visit: Payer: Self-pay

## 2020-12-30 ENCOUNTER — Encounter: Payer: Self-pay | Admitting: Family Medicine

## 2020-12-30 ENCOUNTER — Telehealth: Payer: Self-pay | Admitting: Family Medicine

## 2020-12-30 ENCOUNTER — Ambulatory Visit (INDEPENDENT_AMBULATORY_CARE_PROVIDER_SITE_OTHER): Payer: Medicare HMO | Admitting: Family Medicine

## 2020-12-30 VITALS — BP 134/86 | HR 89 | Temp 97.9°F | Ht 61.5 in | Wt 275.0 lb

## 2020-12-30 DIAGNOSIS — M5416 Radiculopathy, lumbar region: Secondary | ICD-10-CM

## 2020-12-30 DIAGNOSIS — M545 Low back pain, unspecified: Secondary | ICD-10-CM | POA: Diagnosis not present

## 2020-12-30 MED ORDER — OXYCODONE-ACETAMINOPHEN 5-325 MG PO TABS: 1.0000 | ORAL_TABLET | Freq: Four times a day (QID) | ORAL | 0 refills | Status: DC | PRN

## 2020-12-30 NOTE — Patient Instructions (Addendum)
Ice/cold pack over area for 10-15 min twice daily.  OK to take Tylenol 1000 mg (2 extra strength tabs) or 975 mg (3 regular strength tabs) every 6 hours as needed.  If you do not hear anything about your referral in the next week, call our office and ask for an update.  Heat (pad or rice pillow in microwave) over affected area, 10-15 minutes twice daily.   Continue the home exercise program.   Let us know if you need anything.

## 2020-12-30 NOTE — Addendum Note (Signed)
Addended by: [facility] Seller B on: 12/30/2020 10:54 AM   Modules accepted: Orders

## 2020-12-30 NOTE — Telephone Encounter (Signed)
Informed of results.   Rosemarie Ax, MD [facility] 12/30/2020, 8:13 AM

## 2020-12-30 NOTE — Progress Notes (Signed)
Chief Complaint  Patient presents with   Hospitalization Follow-up    Subjective: Patient is a 70 y.o. female here for f/u back pain.  Slept wrong a little over a week ago. No other inj or change in activity. Went to sports med w largely unremarkable workup on 8/3. She was rx'd Norco and given a shot of Toradol. Got up and could not move the next day and went to ER. Received Dilaudid, Valium and was given a rx for Percocet. She has been ambulating with a cane which is not her baseline. No bowel/bladder incontinence   Past Medical History:  Diagnosis Date   Anemia    Anxiety    Arthritis    "knees" (04/26/2016)   Colon polyps    benign per pt   Coronary artery disease    mild per 2015 cath in [place] (OM1 30%, RCA 30%)   GERD (gastroesophageal reflux disease)    Gout    History of hiatal hernia    Hypertension    Migraine    "none since early /2017" (05/06/2016)   Obesity    OSA on CPAP    uses CPAP   Pre-diabetes     Objective: BP 134/86   Pulse 89   Temp 97.9 F (36.6 C) (Oral)   Ht 5' 1.5" (1.562 m)   Wt 275 lb (124.7 kg)   SpO2 98%   BMI 51.12 kg/m  General: Awake, appears stated age Neuro: Antalgic gait. 4/5 strength 2/2 pain. MSK: +TTP over lumbar parasp msc on R Lungs: CTAB, no rales, wheezes or rhonchi. No accessory muscle use Psych: Age appropriate judgment and insight, normal affect and mood  Assessment and Plan: Acute right-sided low back pain without sciatica - Plan: oxyCODONE-acetaminophen (PERCOCET/ROXICET) 5-325 MG tablet  Refill Percocet. Cont NSAIDs and Tylenol. Ice, heat, stretches. Set up home PT. Return to sports med if no improvement.  The patient voiced understanding and agreement to the plan.  I spent 35 min w the patient discussing the above plan and reviewing her chart on the same day of visit.   [place], DO 12/30/20  10:29 AM

## 2020-12-31 ENCOUNTER — Other Ambulatory Visit: Payer: Medicare HMO

## 2020-12-31 NOTE — Addendum Note (Signed)
Addended by: [facility] Seller B on: 12/31/2020 04:11 PM   Modules accepted: Orders

## 2021-01-03 DIAGNOSIS — G4700 Insomnia, unspecified: Secondary | ICD-10-CM | POA: Diagnosis not present

## 2021-01-03 DIAGNOSIS — D649 Anemia, unspecified: Secondary | ICD-10-CM | POA: Diagnosis not present

## 2021-01-03 DIAGNOSIS — I2510 Atherosclerotic heart disease of native coronary artery without angina pectoris: Secondary | ICD-10-CM | POA: Diagnosis not present

## 2021-01-03 DIAGNOSIS — M109 Gout, unspecified: Secondary | ICD-10-CM | POA: Diagnosis not present

## 2021-01-03 DIAGNOSIS — M170 Bilateral primary osteoarthritis of knee: Secondary | ICD-10-CM | POA: Diagnosis not present

## 2021-01-03 DIAGNOSIS — I10 Essential (primary) hypertension: Secondary | ICD-10-CM | POA: Diagnosis not present

## 2021-01-03 DIAGNOSIS — M5416 Radiculopathy, lumbar region: Secondary | ICD-10-CM | POA: Diagnosis not present

## 2021-01-03 DIAGNOSIS — R69 Illness, unspecified: Secondary | ICD-10-CM | POA: Diagnosis not present

## 2021-01-05 DIAGNOSIS — M170 Bilateral primary osteoarthritis of knee: Secondary | ICD-10-CM | POA: Diagnosis not present

## 2021-01-05 DIAGNOSIS — M109 Gout, unspecified: Secondary | ICD-10-CM | POA: Diagnosis not present

## 2021-01-05 DIAGNOSIS — I10 Essential (primary) hypertension: Secondary | ICD-10-CM | POA: Diagnosis not present

## 2021-01-05 DIAGNOSIS — M5416 Radiculopathy, lumbar region: Secondary | ICD-10-CM | POA: Diagnosis not present

## 2021-01-05 DIAGNOSIS — D649 Anemia, unspecified: Secondary | ICD-10-CM | POA: Diagnosis not present

## 2021-01-05 DIAGNOSIS — G4700 Insomnia, unspecified: Secondary | ICD-10-CM | POA: Diagnosis not present

## 2021-01-05 DIAGNOSIS — R69 Illness, unspecified: Secondary | ICD-10-CM | POA: Diagnosis not present

## 2021-01-05 DIAGNOSIS — I2510 Atherosclerotic heart disease of native coronary artery without angina pectoris: Secondary | ICD-10-CM | POA: Diagnosis not present

## 2021-01-07 ENCOUNTER — Encounter: Payer: Self-pay | Admitting: Family Medicine

## 2021-01-07 ENCOUNTER — Other Ambulatory Visit (INDEPENDENT_AMBULATORY_CARE_PROVIDER_SITE_OTHER): Payer: Medicare HMO

## 2021-01-07 ENCOUNTER — Ambulatory Visit (INDEPENDENT_AMBULATORY_CARE_PROVIDER_SITE_OTHER): Payer: Medicare HMO | Admitting: Family Medicine

## 2021-01-07 ENCOUNTER — Other Ambulatory Visit: Payer: Self-pay

## 2021-01-07 VITALS — Ht 61.5 in | Wt 275.0 lb

## 2021-01-07 DIAGNOSIS — M5416 Radiculopathy, lumbar region: Secondary | ICD-10-CM | POA: Diagnosis not present

## 2021-01-07 DIAGNOSIS — E785 Hyperlipidemia, unspecified: Secondary | ICD-10-CM

## 2021-01-07 DIAGNOSIS — M65352 Trigger finger, left little finger: Secondary | ICD-10-CM | POA: Diagnosis not present

## 2021-01-07 LAB — HEPATIC FUNCTION PANEL
ALT: 11 U/L (ref 0–35)
AST: 13 U/L (ref 0–37)
Albumin: 4 g/dL (ref 3.5–5.2)
Alkaline Phosphatase: 86 U/L (ref 39–117)
Bilirubin, Direct: 0.1 mg/dL (ref 0.0–0.3)
Total Bilirubin: 0.4 mg/dL (ref 0.2–1.2)
Total Protein: 7.4 g/dL (ref 6.0–8.3)

## 2021-01-07 LAB — LIPID PANEL
Cholesterol: 167 mg/dL (ref 0–200)
HDL: 67.8 mg/dL (ref >39.00)
LDL Cholesterol: 79 mg/dL (ref 0–99)
NonHDL: 99.69
Total CHOL/HDL Ratio: 2
Triglycerides: 105 mg/dL (ref 0.0–149.0)
VLDL: 21 mg/dL (ref 0.0–40.0)

## 2021-01-07 NOTE — Assessment & Plan Note (Signed)
Imaging has been reassuring and she has had improvement of her symptoms. -Counseled on home exercise therapy and supportive care. -Continue with home health physical therapy. -Could consider further imaging.

## 2021-01-07 NOTE — Patient Instructions (Signed)
Good to see you Please continue with home health   Please send me a message in MyChart with any questions or updates.  Please see me back in 4 weeks.   --Dr. Raeford Razor

## 2021-01-07 NOTE — Progress Notes (Signed)
Christine [name] - 70 y.o. female MRN [id number]  Date of birth: 05-13-1950  SUBJECTIVE:  Including CC & ROS.  No chief complaint on file.   Christine [name] is a 70 y.o. female that is following up for her left finger pain and right radicular pain.  She is doing much better.  She has had home health physical therapy get set up.   Review of Systems See HPI   HISTORY: Past Medical, Surgical, Social, and Family History Reviewed & Updated per EMR.   Pertinent Historical Findings include:  Past Medical History:  Diagnosis Date   Anemia    Anxiety    Arthritis    "knees" (04/26/2016)   Colon polyps    benign per pt   Coronary artery disease    mild per 2015 cath in [place] (OM1 30%, RCA 30%)   GERD (gastroesophageal reflux disease)    Gout    History of hiatal hernia    Hypertension    Migraine    "none since early /2017" (05/06/2016)   Obesity    OSA on CPAP    uses CPAP   Pre-diabetes     Past Surgical History:  Procedure Laterality Date   ABDOMINAL HYSTERECTOMY     "partial"; both ovaries present   CARDIAC CATHETERIZATION  ~ 2015   CARDIAC CATHETERIZATION  2015   In [place] Right 06/01/2017   Procedure: [place];  Surgeon: Edrick Kins, DPM;  Location: [facility];  Service: Podiatry;  Laterality: Right;   LAPAROSCOPIC APPENDECTOMY N/A 05/06/2016   Procedure: LAPAROSCOPIC APPENDECTOMY;  Surgeon: Donnie Mesa, MD;  Location: [facility];  Service: General;  Laterality: N/A;   TONSILLECTOMY      Family History  Problem Relation Age of Onset   Diabetes Mother    Heart disease Mother    High blood pressure Mother    Asthma Mother    Diabetes Father    Heart disease Father    High blood pressure Father    Asthma Father    Diabetes Sister    Colon cancer Neg Hx    Esophageal cancer Neg Hx    Rectal cancer Neg Hx     Social History   Socioeconomic History   Marital status: Single    Spouse name: Not on file   Number of children: 0   Years of  education: Not on file   Highest education level: Not on file  Occupational History   Occupation: retired  Tobacco Use   Smoking status: Former    Packs/day: 0.12    Years: 5.00    Pack years: 0.60    Types: Cigarettes    Quit date: 1980    Years since quitting: 42.7   Smokeless tobacco: Never  Vaping Use   Vaping Use: Never used  Substance and Sexual Activity   Alcohol use: No   Drug use: No   Sexual activity: Never  Other Topics Concern   Not on file  Social History Narrative   Right handed    Lives alone with dog    Social Determinants of Health   Financial Resource Strain: Not on file  Food Insecurity: Not on file  Transportation Needs: Not on file  Physical Activity: Not on file  Stress: Not on file  Social Connections: Not on file  Intimate Partner Violence: Not on file     PHYSICAL EXAM:  VS: Ht 5' 1.5" (1.562 m)   Wt 275 lb (124.7 kg)  BMI 51.12 kg/m  Physical Exam Gen: NAD, alert, cooperative with exam, well-appearing      ASSESSMENT & PLAN:   Lumbar radiculopathy Imaging has been reassuring and she has had improvement of her symptoms. -Counseled on home exercise therapy and supportive care. -Continue with home health physical therapy. -Could consider further imaging.  Trigger little finger of left hand No longer having any further triggering. -Could reinject if needed.

## 2021-01-07 NOTE — Assessment & Plan Note (Signed)
No longer having any further triggering. -Could reinject if needed.

## 2021-01-08 DIAGNOSIS — M5416 Radiculopathy, lumbar region: Secondary | ICD-10-CM | POA: Diagnosis not present

## 2021-01-08 DIAGNOSIS — D649 Anemia, unspecified: Secondary | ICD-10-CM | POA: Diagnosis not present

## 2021-01-08 DIAGNOSIS — M109 Gout, unspecified: Secondary | ICD-10-CM | POA: Diagnosis not present

## 2021-01-08 DIAGNOSIS — R69 Illness, unspecified: Secondary | ICD-10-CM | POA: Diagnosis not present

## 2021-01-08 DIAGNOSIS — I2510 Atherosclerotic heart disease of native coronary artery without angina pectoris: Secondary | ICD-10-CM | POA: Diagnosis not present

## 2021-01-08 DIAGNOSIS — G4700 Insomnia, unspecified: Secondary | ICD-10-CM | POA: Diagnosis not present

## 2021-01-08 DIAGNOSIS — M170 Bilateral primary osteoarthritis of knee: Secondary | ICD-10-CM | POA: Diagnosis not present

## 2021-01-08 DIAGNOSIS — I10 Essential (primary) hypertension: Secondary | ICD-10-CM | POA: Diagnosis not present

## 2021-01-10 ENCOUNTER — Ambulatory Visit (INDEPENDENT_AMBULATORY_CARE_PROVIDER_SITE_OTHER): Payer: Medicare HMO

## 2021-01-10 VITALS — Ht 62.0 in | Wt 270.0 lb

## 2021-01-10 DIAGNOSIS — Z0000 Encounter for general adult medical examination without abnormal findings: Secondary | ICD-10-CM | POA: Diagnosis not present

## 2021-01-10 NOTE — Progress Notes (Signed)
Subjective:   Christine [name] is a 70 y.o. female who presents for an Initial Medicare Annual Wellness Visit.  Virtual Visit via Telephone Note  I connected with  Christine [name] on 01/10/21 at 11:20 AM EDT by telephone and verified that I am speaking with the correct person using two identifiers.  Location: Patient: Home Provider: [facility] Persons participating in the virtual visit: patient/Nurse Health Advisor   I discussed the limitations, risks, security and privacy concerns of performing an evaluation and management service by telephone and the availability of in person appointments. The patient expressed understanding and agreed to proceed.  Interactive audio and video telecommunications were attempted between this nurse and patient, however failed, due to patient having technical difficulties OR patient did not have access to video capability.  We continued and completed visit with audio only.  Some vital signs may be absent or patient reported.   Layloni Fahrner E Lindzy Rupert, LPN   Review of Systems     Cardiac Risk Factors include: advanced age (>90mn, >>29women);dyslipidemia;obesity (BMI >30kg/m2);sedentary lifestyle;hypertension     Objective:    Today's Vitals   01/10/21 1113 01/10/21 1115  Weight: 270 lb (122.5 kg)   Height: '5\' 2"'$  (1.575 m)   PainSc:  0-No pain   Body mass index is 49.38 kg/m.  Advanced Directives 01/10/2021 12/25/2020 12/11/2020 07/09/2020 07/26/2017 05/30/2017 05/06/2016  Does Patient Have a Medical Advance Directive? No No No No No No Yes  Type of Advance Directive - - - - - - HPress photographer Does patient want to make changes to medical advance directive? - - - - - - No - Patient declined  Copy of HHampsteadin Chart? - - - - - - No - copy requested  Would patient like information on creating a medical advance directive? No - Patient declined No - Patient declined - Yes (Inpatient - patient defers creating a medical advance directive  at this time - Information given) No - Patient declined No - Patient declined -    Current Medications (verified) Outpatient Encounter Medications as of 01/10/2021  Medication Sig   acetaminophen (TYLENOL) 650 MG suppository Place 650 mg rectally every 4 (four) hours as needed.   aspirin 81 MG EC tablet Take by mouth.   DULoxetine (CYMBALTA) 60 MG capsule TAKE ONE CAPSULE BY MOUTH DAILY   esomeprazole (NEXIUM) 40 MG capsule Take 1 capsule (40 mg total) by mouth daily.   fexofenadine (ALLEGRA) 180 MG tablet Take 180 mg by mouth daily.   ibuprofen (ADVIL) 800 MG tablet TAKE 1 TABLET BY MOUTH EVERY 8 HOURS AS NEEDED   iron polysaccharides (NIFEREX) 150 MG capsule Take 150 mg by mouth daily.   metoprolol tartrate (LOPRESSOR) 50 MG tablet TAKE 1 TABLET BY MOUTH EVERY DAY   oxybutynin (DITROPAN XL) 15 MG 24 hr tablet Take 1 tablet (15 mg total) by mouth at bedtime.   rosuvastatin (CRESTOR) 10 MG tablet Take 1 tablet (10 mg total) by mouth daily.   triamterene-hydrochlorothiazide (MAXZIDE) 75-50 MG tablet Take 1 tablet by mouth daily.   [DISCONTINUED] diazepam (VALIUM) 5 MG tablet Take 1 tablet (5 mg total) by mouth 2 (two) times daily.   [DISCONTINUED] oxyCODONE-acetaminophen (PERCOCET/ROXICET) 5-325 MG tablet Take 1 tablet by mouth every 6 (six) hours as needed for severe pain.   No facility-administered encounter medications on file as of 01/10/2021.    Allergies (verified) Prednisone, Tape, and Sulfa antibiotics   History: Past Medical History:  Diagnosis Date  Allergy    dust, pollen, sulfa, prednisone   Anemia    Anxiety    Arthritis    "knees" (04/26/2016)   Colon polyps    benign per pt   Coronary artery disease    mild per 2015 cath in [place] (OM1 30%, RCA 30%)   GERD (gastroesophageal reflux disease)    Gout    History of hiatal hernia    Hypertension    Migraine    "none since early /2017" (05/06/2016)   Obesity    OSA on CPAP    uses CPAP   Pre-diabetes    Sleep  apnea 2014   on CPAP   Past Surgical History:  Procedure Laterality Date   ABDOMINAL HYSTERECTOMY     "partial"; both ovaries present   APPENDECTOMY  05/06/2016   CARDIAC CATHETERIZATION  ~ 2015   CARDIAC CATHETERIZATION  2015   In [place] Right 06/01/2017   Procedure: [place];  Surgeon: Edrick Kins, DPM;  Location: [facility];  Service: Podiatry;  Laterality: Right;   LAPAROSCOPIC APPENDECTOMY N/A 05/06/2016   Procedure: LAPAROSCOPIC APPENDECTOMY;  Surgeon: Donnie Mesa, MD;  Location: [facility];  Service: General;  Laterality: N/A;   TONSILLECTOMY     Family History  Problem Relation Age of Onset   Diabetes Mother    Heart disease Mother    High blood pressure Mother    Asthma Mother    Arthritis Mother    Diabetes Father    Heart disease Father    High blood pressure Father    Asthma Father    Diabetes Sister    Alcohol abuse Brother    Drug abuse Brother    Arthritis Sister    Diabetes Sister    Varicose Veins Sister    Colon cancer Neg Hx    Esophageal cancer Neg Hx    Rectal cancer Neg Hx    Social History   Socioeconomic History   Marital status: Single    Spouse name: Not on file   Number of children: 0   Years of education: Not on file   Highest education level: Not on file  Occupational History   Occupation: retired  Tobacco Use   Smoking status: Former    Packs/day: 0.12    Years: 5.00    Pack years: 0.60    Types: Cigarettes    Quit date: 04/26/1978    Years since quitting: 42.7   Smokeless tobacco: Never  Vaping Use   Vaping Use: Never used  Substance and Sexual Activity   Alcohol use: No   Drug use: No   Sexual activity: Not Currently    Birth control/protection: None  Other Topics Concern   Not on file  Social History Narrative   Right handed    Lives alone with dog    Social Determinants of Health   Financial Resource Strain: Low Risk    Difficulty of Paying Living Expenses: Not hard at all  Food  Insecurity: No Food Insecurity   Worried About Charity fundraiser in the Last Year: Never true   [place] in the Last Year: Never true  Transportation Needs: No Transportation Needs   Lack of Transportation (Medical): No   Lack of Transportation (Non-Medical): No  Physical Activity: Insufficiently Active   Days of Exercise per Week: 3 days   Minutes of Exercise per Session: 30 min  Stress: No Stress Concern Present   Feeling of Stress :  Not at all  Social Connections: Socially Isolated   Frequency of Communication with Friends and Family: More than three times a week   Frequency of Social Gatherings with Friends and Family: More than three times a week   Attends Religious Services: Never   Marine scientist or Organizations: No   Attends Music therapist: Never   Marital Status: Never married    Tobacco Counseling Counseling given: Not Answered   Clinical Intake:  Pre-visit preparation completed: Yes  Pain : No/denies pain Pain Score: 0-No pain Faces Pain Scale: No hurt  Faces Pain Scale: No hurt  Nutritional Status: BMI > 30  Obese Nutritional Risks: None Diabetes: No  How often do you need to have someone help you when you read instructions, pamphlets, or other written materials from your doctor or pharmacy?: 1 - Never  Diabetic? borderline  Interpreter Needed?: No  Information entered by :: Doniven Vanpatten, LPN   Activities of Daily Living In your present state of health, do you have any difficulty performing the following activities: 01/10/2021 08/29/2020  Hearing? Y N  Vision? N N  Difficulty concentrating or making decisions? Y N  Comment seeing neurology -  Walking or climbing stairs? N N  Dressing or bathing? N N  Doing errands, shopping? N N  Preparing Food and eating ? N -  Using the Toilet? N -  In the past six months, have you accidently leaked urine? Y -  Comment takes oxybutynin -  Do you have problems with loss of bowel  control? N -  Managing your Medications? N -  Managing your Finances? N -  Housekeeping or managing your Housekeeping? N -  Some recent data might be hidden    Patient Care Team: Shelda Pal, DO as PCP - General (Family Medicine) Elease Hashimoto (Neurology)  Indicate any recent Medical Services you may have received from other than [facility] providers in the past year (date may be approximate).     Assessment:   This is a routine wellness examination for [place].  Hearing/Vision screen Hearing Screening - Comments:: C/o moderate hearing difficulties - shopping for hearing aids Vision Screening - Comments:: Wears eyeglasses - up to date with annual eye exams with Dr Sallye Ober at [facility] issues and exercise activities discussed: Current Exercise Habits: Home exercise routine, Type of exercise: walking;Other - see comments (stationary bike), Time (Minutes): 30, Frequency (Times/Week): 3, Weekly Exercise (Minutes/Week): 90, Intensity: Mild, Exercise limited by: orthopedic condition(s)   Goals Addressed             This Visit's Progress    Patient Stated       She is seeing a therapist - working on being more social       Depression Screen PHQ 2/9 Scores 01/10/2021 08/29/2020  PHQ - 2 Score 0 0    Fall Risk Fall Risk  01/10/2021 12/11/2020 08/29/2020  Falls in the past year? 0 1 1  Number falls in past yr: 0 1 1  Injury with Fall? 0 0 1  Risk for fall due to : Orthopedic patient - History of fall(s)  Follow up Falls prevention discussed - Falls evaluation completed    [place]:  Any stairs in or around the home? No  If so, are there any without handrails? No  Home free of loose throw rugs in walkways, pet beds, electrical cords, etc? Yes  Adequate lighting in your home  to reduce risk of falls? Yes   ASSISTIVE DEVICES UTILIZED TO PREVENT FALLS:  Life alert? No  Use of a cane, walker or w/c? Yes  Grab bars in  the bathroom? No  Shower chair or bench in shower? No  Elevated toilet seat or a handicapped toilet? No   TIMED UP AND GO:  Was the test performed? No . Telephonic visit  Cognitive Function: Normal cognitive status assessed by direct observation by this Nurse Health Advisor. No abnormalities found.     Montreal Cognitive Assessment  12/11/2020  Visuospatial/ Executive (0/5) 5  Naming (0/3) 2  Attention: Read list of digits (0/2) 2  Attention: Read list of letters (0/1) 1  Attention: Serial 7 subtraction starting at 100 (0/3) 3  Language: Repeat phrase (0/2) 2  Language : Fluency (0/1) 1  Abstraction (0/2) 2  Delayed Recall (0/5) 3  Orientation (0/6) 6  Total 27  Adjusted Score (based on education) 28      Immunizations Immunization History  Administered Date(s) Administered   Fluad Quad(high Dose 65+) 01/28/2020   Influenza, High Dose Seasonal PF 12/29/2018   Influenza,inj,Quad PF,6+ Mos 02/27/2018   Influenza-Unspecified 02/14/2017, 02/02/2019   PFIZER(Purple Top)SARS-COV-2 Vaccination 05/20/2019, 06/11/2019, 02/06/2020, 09/16/2020   PNEUMOCOCCAL CONJUGATE-20 07/19/2020   Pneumococcal Polysaccharide-23 12/26/2014, 09/27/2018   Tdap 08/06/2016   Zoster Recombinat (Shingrix) 12/10/2020    TDAP status: Up to date  Flu Vaccine status: Due, Education has been provided regarding the importance of this vaccine. Advised may receive this vaccine at local pharmacy or Health Dept. Aware to provide a copy of the vaccination record if obtained from local pharmacy or Health Dept. Verbalized acceptance and understanding.  Pneumococcal vaccine status: Up to date  Covid-19 vaccine status: Completed vaccines  Qualifies for Shingles Vaccine? Yes   Zostavax completed No   Shingrix Completed?: No.    Education has been provided regarding the importance of this vaccine. Patient has been advised to call insurance company to determine out of pocket expense if they have not yet received this  vaccine. Advised may also receive vaccine at local pharmacy or Health Dept. Verbalized acceptance and understanding.  Screening Tests Health Maintenance  Topic Date Due   INFLUENZA VACCINE  11/24/2020   Zoster Vaccines- Shingrix (2 of 2) 02/04/2021   MAMMOGRAM  12/02/2021   COLONOSCOPY (Pts 45-43yr Insurance coverage will need to be confirmed)  09/25/2022   TETANUS/TDAP  08/07/2026   DEXA SCAN  Completed   COVID-19 Vaccine  Completed   Hepatitis C Screening  Completed   HPV VACCINES  Aged Out    Health Maintenance  Health Maintenance Due  Topic Date Due   INFLUENZA VACCINE  11/24/2020    Colorectal cancer screening: Type of screening: Colonoscopy. Completed 09/24/2012. Repeat every 10 years  Mammogram status: Completed 12/03/2019. Repeat every year She is going to make an appt when she has time  Bone Density status: Completed 10/06/2018. Results reflect: Bone density results: NORMAL. Repeat every 5 years.  Lung Cancer Screening: (Low Dose CT Chest recommended if Age 68-80years, 30 pack-year currently smoking OR have quit w/in 15years.) does not qualify.   Additional Screening:  Hepatitis C Screening: does qualify; Completed 09/27/2018  Vision Screening: Recommended annual ophthalmology exams for early detection of glaucoma and other disorders of the eye. Is the patient up to date with their annual eye exam?  Yes  Who is the provider or what is the name of the office in which the patient attends annual eye exams?  [facility] If pt is not established with a provider, would they like to be referred to a provider to establish care? No .   Dental Screening: Recommended annual dental exams for proper oral hygiene  Community Resource Referral / Chronic Care Management: CRR required this visit?  No   CCM required this visit?  No      Plan:     I have personally reviewed and noted the following in the patient's chart:   Medical and social history Use of alcohol, tobacco or illicit  drugs  Current medications and supplements including opioid prescriptions. Patient is not currently taking opioid prescriptions. Functional ability and status Nutritional status Physical activity Advanced directives List of other physicians Hospitalizations, surgeries, and ER visits in previous 12 months Vitals Screenings to include cognitive, depression, and falls Referrals and appointments  In addition, I have reviewed and discussed with patient certain preventive protocols, quality metrics, and best practice recommendations. A written personalized care plan for preventive services as well as general preventive health recommendations were provided to patient.     Sandrea Hammond, LPN   [id number]   Nurse Notes: None

## 2021-01-10 NOTE — Patient Instructions (Signed)
Christine [name] , Thank you for taking time to come for your Medicare Wellness Visit. I appreciate your ongoing commitment to your health goals. Please review the following plan we discussed and let me know if I can assist you in the future.   Screening recommendations/referrals: Colonoscopy: Done 2014 - Repeat in 10 years Mammogram: Done 12/03/2019 - Repeat annually *make appointment soon Bone Density: Done 10/06/2018 - Repeat in 5 years Recommended yearly ophthalmology/optometry visit for glaucoma screening and checkup Recommended yearly dental visit for hygiene and checkup  Vaccinations: Influenza vaccine: Done 01/28/2020 - Repeat annually *due Pneumococcal vaccine: Done 09/27/2018 & 07/19/2020 Tdap vaccine: Done 08/06/2016 - Repeat in 10 years  Shingles vaccine: Done 12/10/2020, get second dose at next appointment in January   Covid-19: Done 05/20/2019, 06/11/2019, 02/06/2020, & 09/16/2020  Advanced directives: Please bring a copy of your health care power of attorney and living will to the office to be added to your chart at your convenience.   Conditions/risks identified: Aim for 30 minutes of exercise or brisk walking each day, drink 6-8 glasses of water and eat lots of fruits and vegetables.   Next appointment: Follow up in one year for your annual wellness visit    Preventive Care 65 Years and Older, Female Preventive care refers to lifestyle choices and visits with your health care provider that can promote health and wellness. What does preventive care include? A yearly physical exam. This is also called an annual well check. Dental exams once or twice a year. Routine eye exams. Ask your health care provider how often you should have your eyes checked. Personal lifestyle choices, including: Daily care of your teeth and gums. Regular physical activity. Eating a healthy diet. Avoiding tobacco and drug use. Limiting alcohol use. Practicing safe sex. Taking low-dose aspirin every  day. Taking vitamin and mineral supplements as recommended by your health care provider. What happens during an annual well check? The services and screenings done by your health care provider during your annual well check will depend on your age, overall health, lifestyle risk factors, and family history of disease. Counseling  Your health care provider may ask you questions about your: Alcohol use. Tobacco use. Drug use. Emotional well-being. Home and relationship well-being. Sexual activity. Eating habits. History of falls. Memory and ability to understand (cognition). Work and work Statistician. Reproductive health. Screening  You may have the following tests or measurements: Height, weight, and BMI. Blood pressure. Lipid and cholesterol levels. These may be checked every 5 years, or more frequently if you are over 82 years old. Skin check. Lung cancer screening. You may have this screening every year starting at age 42 if you have a 30-pack-year history of smoking and currently smoke or have quit within the past 15 years. Fecal occult blood test (FOBT) of the stool. You may have this test every year starting at age 36. Flexible sigmoidoscopy or colonoscopy. You may have a sigmoidoscopy every 5 years or a colonoscopy every 10 years starting at age 36. Hepatitis C blood test. Hepatitis B blood test. Sexually transmitted disease (STD) testing. Diabetes screening. This is done by checking your blood sugar (glucose) after you have not eaten for a while (fasting). You may have this done every 1-3 years. Bone density scan. This is done to screen for osteoporosis. You may have this done starting at age 51. Mammogram. This may be done every 1-2 years. Talk to your health care provider about how often you should have regular mammograms. Talk with  your health care provider about your test results, treatment options, and if necessary, the need for more tests. Vaccines  Your health care  provider may recommend certain vaccines, such as: Influenza vaccine. This is recommended every year. Tetanus, diphtheria, and acellular pertussis (Tdap, Td) vaccine. You may need a Td booster every 10 years. Zoster vaccine. You may need this after age 35. Pneumococcal 13-valent conjugate (PCV13) vaccine. One dose is recommended after age 5. Pneumococcal polysaccharide (PPSV23) vaccine. One dose is recommended after age 68. Talk to your health care provider about which screenings and vaccines you need and how often you need them. This information is not intended to replace advice given to you by your health care provider. Make sure you discuss any questions you have with your health care provider. Document Released: 05/09/2015 Document Revised: 12/31/2015 Document Reviewed: 02/11/2015 Elsevier Interactive Patient Education  2017 [place] Prevention in the Home Falls can cause injuries. They can happen to people of all ages. There are many things you can do to make your home safe and to help prevent falls. What can I do on the outside of my home? Regularly fix the edges of walkways and driveways and fix any cracks. Remove anything that might make you trip as you walk through a door, such as a raised step or threshold. Trim any bushes or trees on the path to your home. Use bright outdoor lighting. Clear any walking paths of anything that might make someone trip, such as rocks or tools. Regularly check to see if handrails are loose or broken. Make sure that both sides of any steps have handrails. Any raised decks and porches should have guardrails on the edges. Have any leaves, snow, or ice cleared regularly. Use sand or salt on walking paths during winter. Clean up any spills in your garage right away. This includes oil or grease spills. What can I do in the bathroom? Use night lights. Install grab bars by the toilet and in the tub and shower. Do not use towel bars as grab  bars. Use non-skid mats or decals in the tub or shower. If you need to sit down in the shower, use a plastic, non-slip stool. Keep the floor dry. Clean up any water that spills on the floor as soon as it happens. Remove soap buildup in the tub or shower regularly. Attach bath mats securely with double-sided non-slip rug tape. Do not have throw rugs and other things on the floor that can make you trip. What can I do in the bedroom? Use night lights. Make sure that you have a light by your bed that is easy to reach. Do not use any sheets or blankets that are too big for your bed. They should not hang down onto the floor. Have a firm chair that has side arms. You can use this for support while you get dressed. Do not have throw rugs and other things on the floor that can make you trip. What can I do in the kitchen? Clean up any spills right away. Avoid walking on wet floors. Keep items that you use a lot in easy-to-reach places. If you need to reach something above you, use a strong step stool that has a grab bar. Keep electrical cords out of the way. Do not use floor polish or wax that makes floors slippery. If you must use wax, use non-skid floor wax. Do not have throw rugs and other things on the floor that can make you trip.  What can I do with my stairs? Do not leave any items on the stairs. Make sure that there are handrails on both sides of the stairs and use them. Fix handrails that are broken or loose. Make sure that handrails are as long as the stairways. Check any carpeting to make sure that it is firmly attached to the stairs. Fix any carpet that is loose or worn. Avoid having throw rugs at the top or bottom of the stairs. If you do have throw rugs, attach them to the floor with carpet tape. Make sure that you have a light switch at the top of the stairs and the bottom of the stairs. If you do not have them, ask someone to add them for you. What else can I do to help prevent  falls? Wear shoes that: Do not have high heels. Have rubber bottoms. Are comfortable and fit you well. Are closed at the toe. Do not wear sandals. If you use a stepladder: Make sure that it is fully opened. Do not climb a closed stepladder. Make sure that both sides of the stepladder are locked into place. Ask someone to hold it for you, if possible. Clearly mark and make sure that you can see: Any grab bars or handrails. First and last steps. Where the edge of each step is. Use tools that help you move around (mobility aids) if they are needed. These include: Canes. Walkers. Scooters. Crutches. Turn on the lights when you go into a dark area. Replace any light bulbs as soon as they burn out. Set up your furniture so you have a clear path. Avoid moving your furniture around. If any of your floors are uneven, fix them. If there are any pets around you, be aware of where they are. Review your medicines with your doctor. Some medicines can make you feel dizzy. This can increase your chance of falling. Ask your doctor what other things that you can do to help prevent falls. This information is not intended to replace advice given to you by your health care provider. Make sure you discuss any questions you have with your health care provider. Document Released: 02/06/2009 Document Revised: 09/18/2015 Document Reviewed: 05/17/2014 Elsevier Interactive Patient Education  2017 [employer].

## 2021-01-12 DIAGNOSIS — D649 Anemia, unspecified: Secondary | ICD-10-CM | POA: Diagnosis not present

## 2021-01-12 DIAGNOSIS — I2510 Atherosclerotic heart disease of native coronary artery without angina pectoris: Secondary | ICD-10-CM | POA: Diagnosis not present

## 2021-01-12 DIAGNOSIS — M5416 Radiculopathy, lumbar region: Secondary | ICD-10-CM | POA: Diagnosis not present

## 2021-01-12 DIAGNOSIS — R69 Illness, unspecified: Secondary | ICD-10-CM | POA: Diagnosis not present

## 2021-01-12 DIAGNOSIS — G4700 Insomnia, unspecified: Secondary | ICD-10-CM | POA: Diagnosis not present

## 2021-01-12 DIAGNOSIS — M170 Bilateral primary osteoarthritis of knee: Secondary | ICD-10-CM | POA: Diagnosis not present

## 2021-01-12 DIAGNOSIS — M109 Gout, unspecified: Secondary | ICD-10-CM | POA: Diagnosis not present

## 2021-01-12 DIAGNOSIS — I10 Essential (primary) hypertension: Secondary | ICD-10-CM | POA: Diagnosis not present

## 2021-01-13 ENCOUNTER — Ambulatory Visit: Payer: Medicare HMO | Admitting: Family Medicine

## 2021-01-13 DIAGNOSIS — G4733 Obstructive sleep apnea (adult) (pediatric): Secondary | ICD-10-CM | POA: Diagnosis not present

## 2021-01-17 IMAGING — CT CT ABD-PELV W/ CM
2 of 5 series · 17 of 46 positions shown, 19 images · IV contrast (Omni 300)
Comparison: 1364

CLINICAL DATA: Abdominal pain

EXAM:
CT ABDOMEN AND PELVIS WITH CONTRAST
TECHNIQUE: Multidetector CT imaging of the abdomen and pelvis was performed
using the standard protocol following bolus administration of
intravenous contrast.
CONTRAST:  100mL OMNIPAQUE IOHEXOL 300 MG/ML  SOLN

[Series 3: a/p w/ 5mm · axial · 0.77mm/px · z∈[+801,+1201]mm · 14 of 91 slices shown, 16 images]
[im 6/91  soft-tissue]
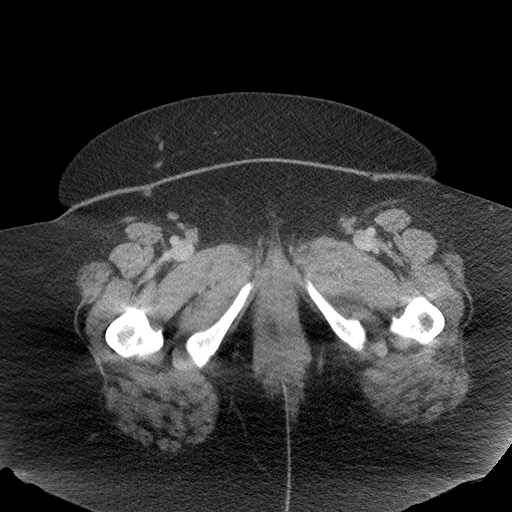
[im 6/91  bone]
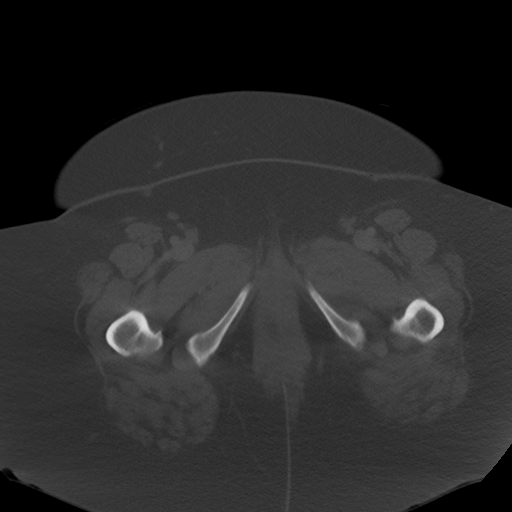
[im 11/91  soft-tissue]
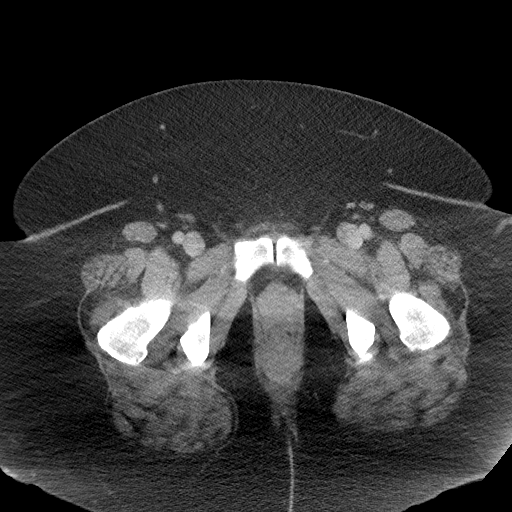
[im 21/91  soft-tissue]
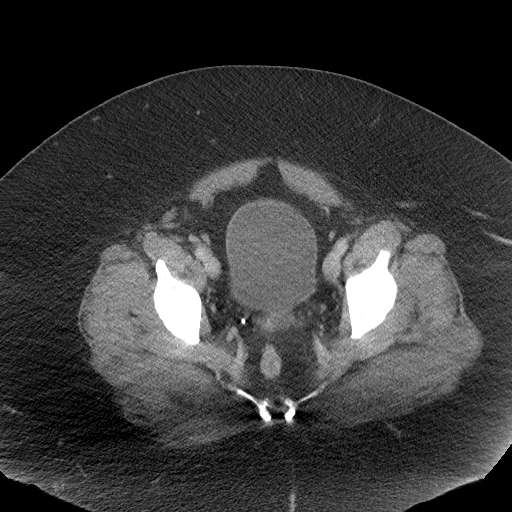
[im 26/91  soft-tissue]
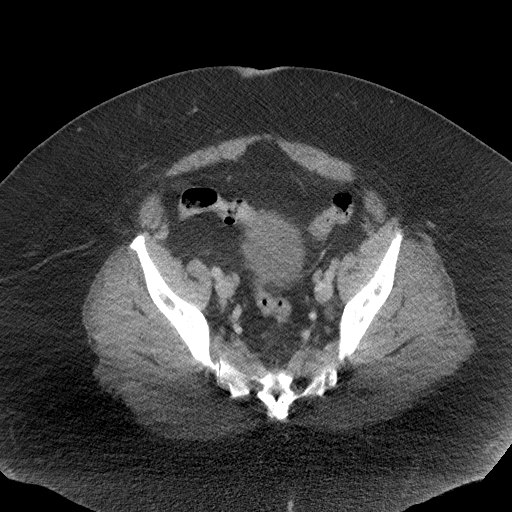
[im 31/91  soft-tissue]
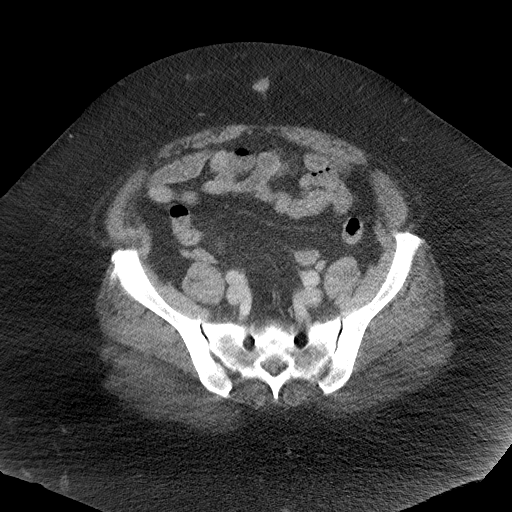
[im 36/91  soft-tissue]
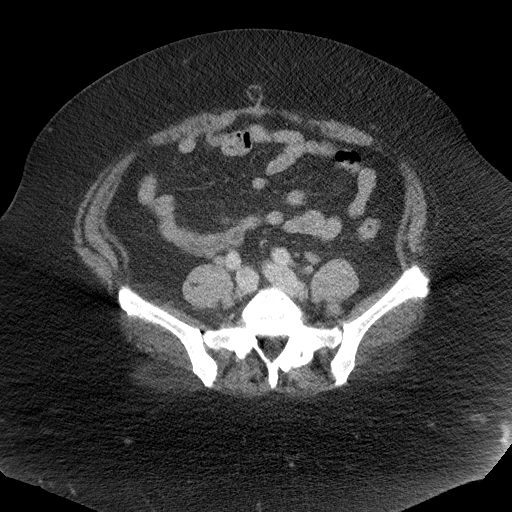
[im 41/91  soft-tissue]
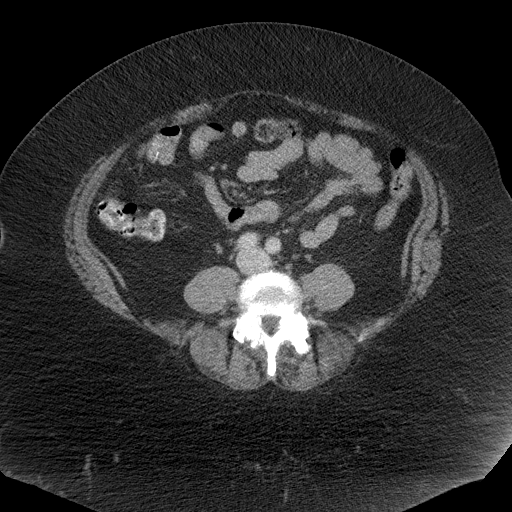
[im 51/91  soft-tissue]
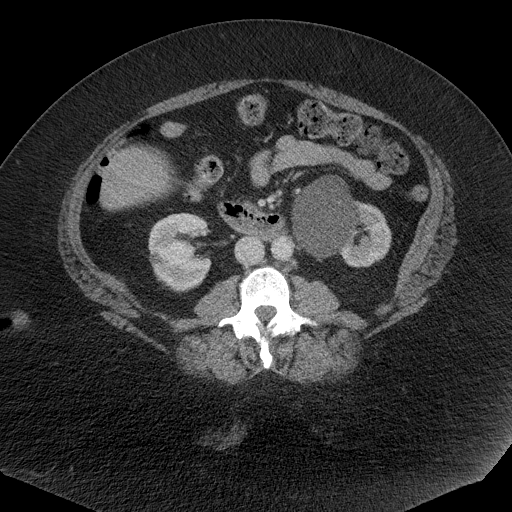
[im 56/91  soft-tissue]
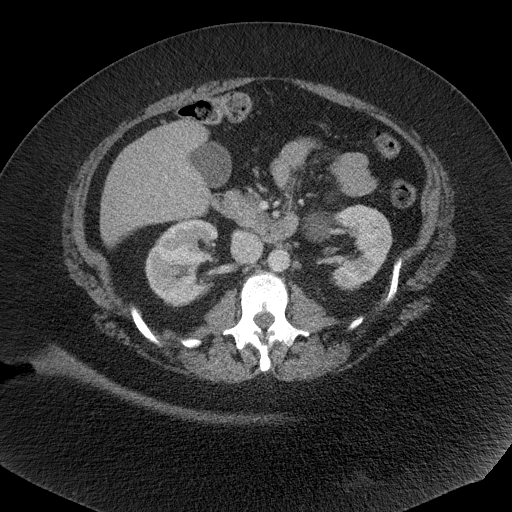
[im 56/91  bone]
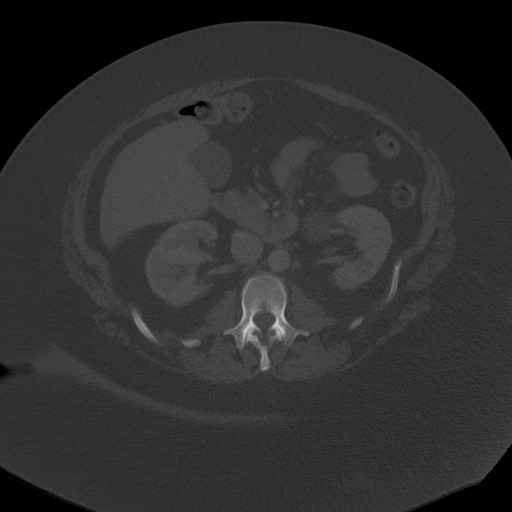
[im 61/91  soft-tissue]
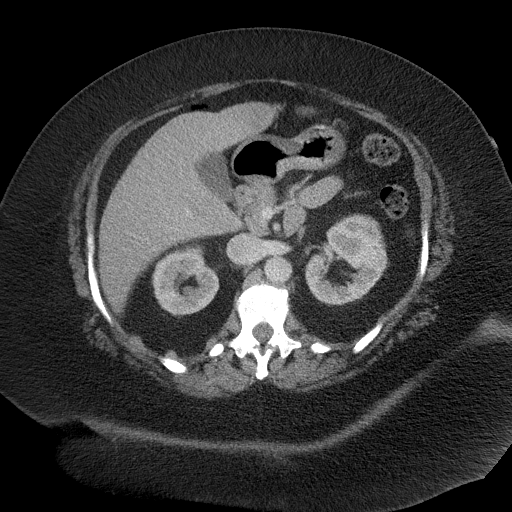
[im 66/91  soft-tissue]
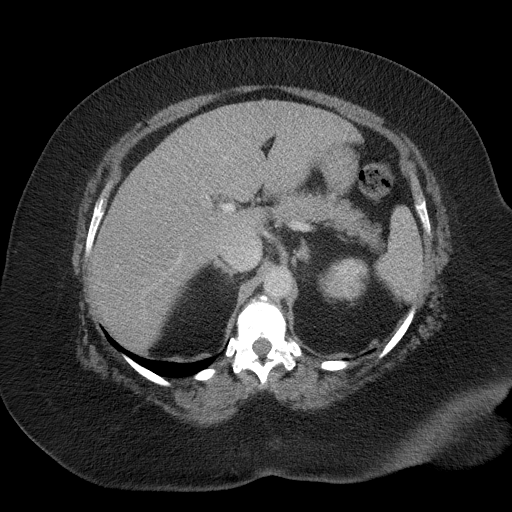
[im 71/91  soft-tissue]
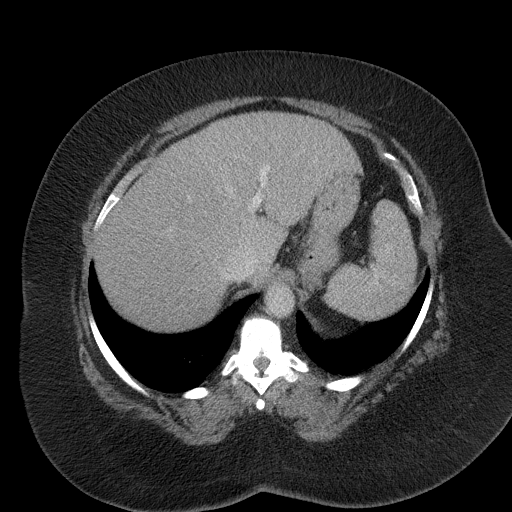
[im 81/91  soft-tissue]
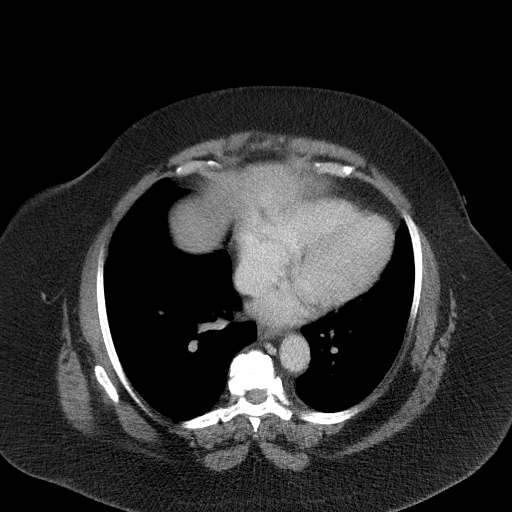
[im 86/91  soft-tissue]
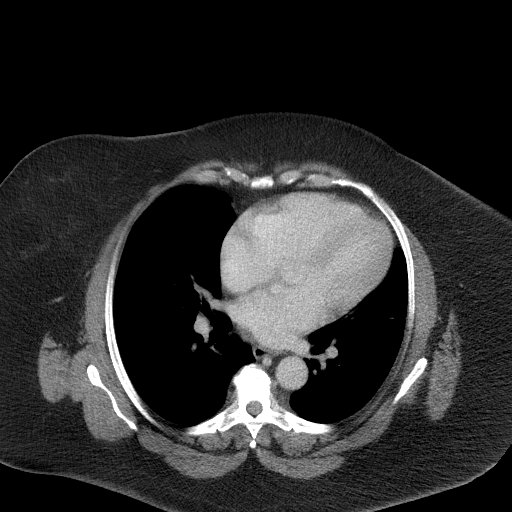

[Series 6: a/p w/ cor · coronal · 0.82mm/px · 3 of 161 slices shown]
[im 54/161  soft-tissue]
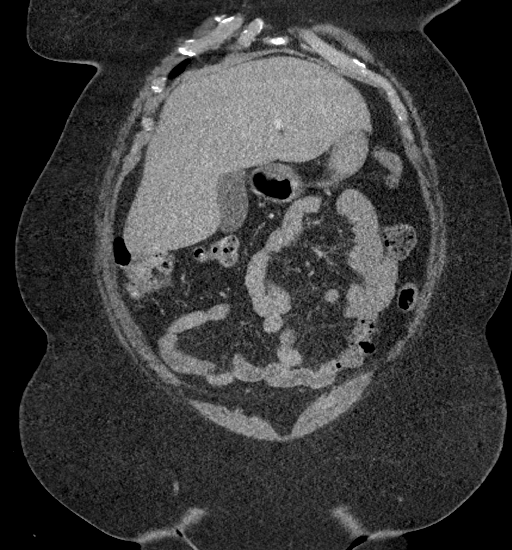
[im 72/161  soft-tissue]
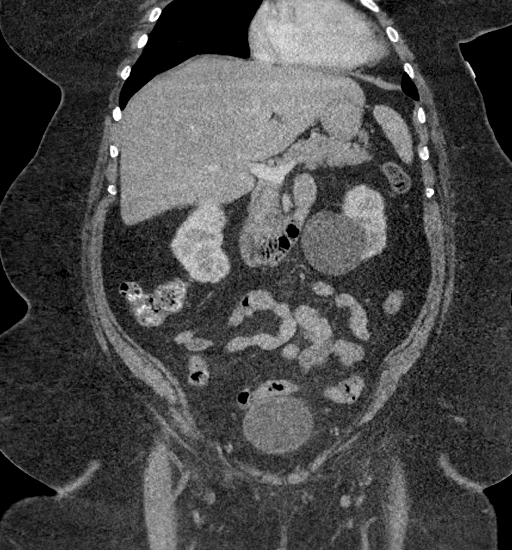
[im 89/161  soft-tissue]
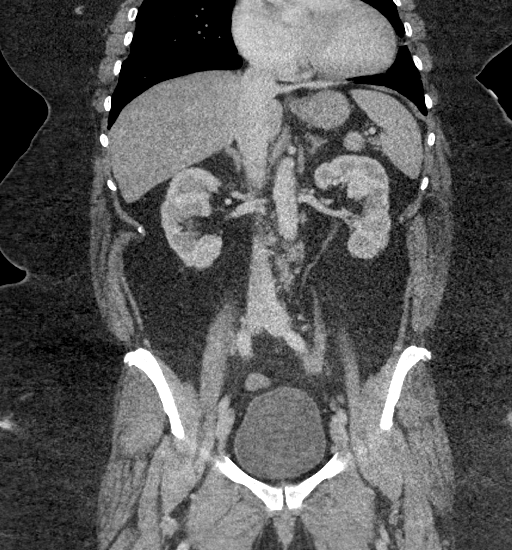

[17 of 46 positions shown; findings below may reference images not displayed]

FINDINGS: Lower chest: Minimal subsegmental atelectasis.

Hepatobiliary: No focal liver abnormality is seen. No gallstones,
gallbladder wall thickening, or biliary dilatation.

Pancreas: Unremarkable. No pancreatic ductal dilatation or
surrounding inflammatory changes.

Spleen: Normal in size without focal abnormality.

Adrenals/Urinary Tract: Unchanged left renal cyst. Otherwise
unremarkable.

Stomach/Bowel: Interval removal of appendix. Bowel is normal in
caliber. There is no evidence of diverticulitis.

Vascular/Lymphatic: No lymphadenopathy.

Reproductive: No pelvic or adnexal mass.

Other: No ascites.

Musculoskeletal: No acute or significant osseous findings.
IMPRESSION: No findings to account for reported pain. Specifically, no evidence
of acute diverticulitis.

## 2021-01-23 ENCOUNTER — Encounter: Payer: Medicare HMO | Admitting: Gastroenterology

## 2021-01-26 DIAGNOSIS — R69 Illness, unspecified: Secondary | ICD-10-CM | POA: Diagnosis not present

## 2021-01-26 DIAGNOSIS — I10 Essential (primary) hypertension: Secondary | ICD-10-CM | POA: Diagnosis not present

## 2021-01-26 DIAGNOSIS — G4700 Insomnia, unspecified: Secondary | ICD-10-CM | POA: Diagnosis not present

## 2021-01-26 DIAGNOSIS — M170 Bilateral primary osteoarthritis of knee: Secondary | ICD-10-CM | POA: Diagnosis not present

## 2021-01-26 DIAGNOSIS — D649 Anemia, unspecified: Secondary | ICD-10-CM | POA: Diagnosis not present

## 2021-01-26 DIAGNOSIS — M109 Gout, unspecified: Secondary | ICD-10-CM | POA: Diagnosis not present

## 2021-01-26 DIAGNOSIS — I2510 Atherosclerotic heart disease of native coronary artery without angina pectoris: Secondary | ICD-10-CM | POA: Diagnosis not present

## 2021-01-26 DIAGNOSIS — M5416 Radiculopathy, lumbar region: Secondary | ICD-10-CM | POA: Diagnosis not present

## 2021-02-02 ENCOUNTER — Other Ambulatory Visit (HOSPITAL_BASED_OUTPATIENT_CLINIC_OR_DEPARTMENT_OTHER): Payer: Self-pay | Admitting: Family Medicine

## 2021-02-02 DIAGNOSIS — M109 Gout, unspecified: Secondary | ICD-10-CM | POA: Diagnosis not present

## 2021-02-02 DIAGNOSIS — R69 Illness, unspecified: Secondary | ICD-10-CM | POA: Diagnosis not present

## 2021-02-02 DIAGNOSIS — G4700 Insomnia, unspecified: Secondary | ICD-10-CM | POA: Diagnosis not present

## 2021-02-02 DIAGNOSIS — Z1231 Encounter for screening mammogram for malignant neoplasm of breast: Secondary | ICD-10-CM

## 2021-02-02 DIAGNOSIS — M5416 Radiculopathy, lumbar region: Secondary | ICD-10-CM | POA: Diagnosis not present

## 2021-02-02 DIAGNOSIS — I2510 Atherosclerotic heart disease of native coronary artery without angina pectoris: Secondary | ICD-10-CM | POA: Diagnosis not present

## 2021-02-02 DIAGNOSIS — M170 Bilateral primary osteoarthritis of knee: Secondary | ICD-10-CM | POA: Diagnosis not present

## 2021-02-02 DIAGNOSIS — I10 Essential (primary) hypertension: Secondary | ICD-10-CM | POA: Diagnosis not present

## 2021-02-02 DIAGNOSIS — D649 Anemia, unspecified: Secondary | ICD-10-CM | POA: Diagnosis not present

## 2021-02-06 ENCOUNTER — Ambulatory Visit: Payer: Medicare HMO | Admitting: Family Medicine

## 2021-02-11 ENCOUNTER — Encounter: Payer: Self-pay | Admitting: Family Medicine

## 2021-02-11 ENCOUNTER — Ambulatory Visit: Payer: Medicare HMO | Admitting: Family Medicine

## 2021-02-11 DIAGNOSIS — M5416 Radiculopathy, lumbar region: Secondary | ICD-10-CM | POA: Diagnosis not present

## 2021-02-11 NOTE — Patient Instructions (Signed)
Good to see you Please continue the exercises  Please try a recumbent bike   Please send me a message in MyChart with any questions or updates.  Please see me back in as needed.   --Dr. Raeford Razor

## 2021-02-11 NOTE — Progress Notes (Signed)
Christine [name] - 70 y.o. female MRN [id number]  Date of birth: [birth year]  SUBJECTIVE:  Including CC & ROS.  No chief complaint on file.   Christine [name] is a 70 y.o. female that is following up for her radicular pain.  She has been doing well with no significant exacerbation.    Review of Systems See HPI   HISTORY: Past Medical, Surgical, Social, and Family History Reviewed & Updated per EMR.   Pertinent Historical Findings include:  Past Medical History:  Diagnosis Date   Allergy    dust, pollen, sulfa, prednisone   Anemia    Anxiety    Arthritis    "knees" (04/26/2016)   Colon polyps    benign per pt   Coronary artery disease    mild per 2015 cath in [place] (OM1 30%, RCA 30%)   GERD (gastroesophageal reflux disease)    Gout    History of hiatal hernia    Hypertension    Migraine    "none since early /2017" (05/06/2016)   Obesity    OSA on CPAP    uses CPAP   Pre-diabetes    Sleep apnea 2014   on CPAP    Past Surgical History:  Procedure Laterality Date   ABDOMINAL HYSTERECTOMY     "partial"; both ovaries present   APPENDECTOMY  05/06/2016   CARDIAC CATHETERIZATION  ~ 2015   CARDIAC CATHETERIZATION  2015   In [place] Right 06/01/2017   Procedure: [place];  Surgeon: Edrick Kins, DPM;  Location: [facility];  Service: Podiatry;  Laterality: Right;   LAPAROSCOPIC APPENDECTOMY N/A 05/06/2016   Procedure: LAPAROSCOPIC APPENDECTOMY;  Surgeon: Donnie Mesa, MD;  Location: [facility];  Service: General;  Laterality: N/A;   TONSILLECTOMY      Family History  Problem Relation Age of Onset   Diabetes Mother    Heart disease Mother    High blood pressure Mother    Asthma Mother    Arthritis Mother    Diabetes Father    Heart disease Father    High blood pressure Father    Asthma Father    Diabetes Sister    Alcohol abuse Brother    Drug abuse Brother    Arthritis Sister    Diabetes Sister    Varicose Veins Sister    Colon cancer Neg  Hx    Esophageal cancer Neg Hx    Rectal cancer Neg Hx     Social History   Socioeconomic History   Marital status: Single    Spouse name: Not on file   Number of children: 0   Years of education: Not on file   Highest education level: Not on file  Occupational History   Occupation: retired  Tobacco Use   Smoking status: Former    Packs/day: 0.12    Years: 5.00    Pack years: 0.60    Types: Cigarettes    Quit date: 04/26/1978    Years since quitting: 42.8   Smokeless tobacco: Never  Vaping Use   Vaping Use: Never used  Substance and Sexual Activity   Alcohol use: No   Drug use: No   Sexual activity: Not Currently    Birth control/protection: None  Other Topics Concern   Not on file  Social History Narrative   Right handed    Lives alone with dog    Social Determinants of Health   Financial Resource Strain: Low Risk    Difficulty of Paying  Living Expenses: Not hard at all  Food Insecurity: No Food Insecurity   Worried About [place] in the Last Year: Never true   Ran Out of Food in the Last Year: Never true  Transportation Needs: No Transportation Needs   Lack of Transportation (Medical): No   Lack of Transportation (Non-Medical): No  Physical Activity: Insufficiently Active   Days of Exercise per Week: 3 days   Minutes of Exercise per Session: 30 min  Stress: No Stress Concern Present   Feeling of Stress : Not at all  Social Connections: Socially Isolated   Frequency of Communication with Friends and Family: More than three times a week   Frequency of Social Gatherings with Friends and Family: More than three times a week   Attends Religious Services: Never   Marine scientist or Organizations: No   Attends Music therapist: Never   Marital Status: Never married  Human resources officer Violence: Not At Risk   Fear of Current or Ex-Partner: No   Emotionally Abused: No   Physically Abused: No   Sexually Abused: No     PHYSICAL  EXAM:  VS: BP 128/72 (BP Location: Left Arm, Patient Position: Sitting, Cuff Size: Large)   Ht 5\' 2"  (1.575 m)   Wt 270 lb (122.5 kg)   BMI 49.38 kg/m  Physical Exam Gen: NAD, alert, cooperative with exam, well-appearing      ASSESSMENT & PLAN:   Lumbar radiculopathy Doing well with no acute changes. -Counseled on home exercise therapy and supportive care. -could consider physical therapy.

## 2021-02-11 NOTE — Assessment & Plan Note (Signed)
Doing well with no acute changes. -Counseled on home exercise therapy and supportive care. -could consider physical therapy.

## 2021-02-12 DIAGNOSIS — G4733 Obstructive sleep apnea (adult) (pediatric): Secondary | ICD-10-CM | POA: Diagnosis not present

## 2021-02-25 DIAGNOSIS — R69 Illness, unspecified: Secondary | ICD-10-CM | POA: Diagnosis not present

## 2021-02-25 DIAGNOSIS — F411 Generalized anxiety disorder: Secondary | ICD-10-CM | POA: Diagnosis not present

## 2021-03-02 ENCOUNTER — Ambulatory Visit (HOSPITAL_BASED_OUTPATIENT_CLINIC_OR_DEPARTMENT_OTHER): Payer: Medicare HMO

## 2021-03-04 ENCOUNTER — Ambulatory Visit: Payer: Medicare HMO | Admitting: Podiatry

## 2021-03-11 ENCOUNTER — Encounter: Payer: Self-pay | Admitting: Podiatry

## 2021-03-20 ENCOUNTER — Encounter: Payer: Self-pay | Admitting: Family Medicine

## 2021-03-23 ENCOUNTER — Other Ambulatory Visit: Payer: Self-pay | Admitting: Family Medicine

## 2021-03-23 ENCOUNTER — Other Ambulatory Visit: Payer: Self-pay

## 2021-03-23 ENCOUNTER — Ambulatory Visit: Payer: Medicare HMO | Admitting: Podiatry

## 2021-03-23 DIAGNOSIS — L989 Disorder of the skin and subcutaneous tissue, unspecified: Secondary | ICD-10-CM

## 2021-03-23 DIAGNOSIS — B351 Tinea unguium: Secondary | ICD-10-CM

## 2021-03-23 DIAGNOSIS — M79675 Pain in left toe(s): Secondary | ICD-10-CM

## 2021-03-23 DIAGNOSIS — M79674 Pain in right toe(s): Secondary | ICD-10-CM | POA: Diagnosis not present

## 2021-03-23 MED ORDER — IBUPROFEN 800 MG PO TABS: 800.0000 mg | ORAL_TABLET | Freq: Three times a day (TID) | ORAL | 0 refills | Status: DC | PRN

## 2021-03-23 NOTE — Progress Notes (Signed)
    Subjective: Patient is a 70 y.o. female presenting to the office today for routine foot care.  Patient states that she is unable to trim her own nails and they are painful with shoe gear.  She also has symptomatic calluses that are painful with shoe gear as well.  She presents today for routine foot care  Past Medical History:  Diagnosis Date   Allergy    dust, pollen, sulfa, prednisone   Anemia    Anxiety    Arthritis    "knees" (04/26/2016)   Colon polyps    benign per pt   Coronary artery disease    mild per 2015 cath in [place] (OM1 30%, RCA 30%)   GERD (gastroesophageal reflux disease)    Gout    History of hiatal hernia    Hypertension    Migraine    "none since early /2017" (05/06/2016)   Obesity    OSA on CPAP    uses CPAP   Pre-diabetes    Sleep apnea 2014   on CPAP    Objective:  Physical Exam General: Alert and oriented x3 in no acute distress  Dermatology: Hyperkeratotic lesions present on the bilateral feet x 4. Pain on palpation with a central nucleated core noted. Skin is warm, dry and supple bilateral lower extremities. Negative for open lesions or macerations. Nails are tender, long, thickened and dystrophic with subungual debris, consistent with onychomycosis, 1-5 bilateral. No signs of infection noted.  Vascular: Palpable pedal pulses bilaterally. No edema or erythema noted. Capillary refill within normal limits.  Neurological: Epicritic and protective threshold grossly intact bilaterally.   Musculoskeletal Exam: Pain on palpation at the keratotic lesions noted. Range of motion within normal limits bilateral. Muscle strength 5/5 in all groups bilateral.    Assessment: 1. Onychodystrophic nails 1-5 bilateral with hyperkeratosis of nails.  2. Onychomycosis of nail due to dermatophyte bilateral 3. Pre-ulcerative callus lesions noted to the bilateral feet x 4  Plan of Care:  1. Patient evaluated. 2. Excisional debridement of keratoic lesion using a  chisel blade was performed without incident.  3. Dressed with light dressing. 4. Mechanical debridement of nails 1-5 bilaterally performed using a nail nipper. Filed with dremel without incident.  5.  Continue OTC Motrin 800 mg as needed  6.  Recommend good supportive shoes that support the foot  7.  Patient is to return to the clinic in 3 months.   Edrick Kins, DPM [facility]  Dr. Edrick Kins, DPM    [address], [place] [address]                Office [phone]  Fax [phone]

## 2021-03-30 DIAGNOSIS — R69 Illness, unspecified: Secondary | ICD-10-CM | POA: Diagnosis not present

## 2021-03-30 DIAGNOSIS — F411 Generalized anxiety disorder: Secondary | ICD-10-CM | POA: Diagnosis not present

## 2021-04-07 ENCOUNTER — Other Ambulatory Visit: Payer: Self-pay

## 2021-04-07 ENCOUNTER — Ambulatory Visit (HOSPITAL_BASED_OUTPATIENT_CLINIC_OR_DEPARTMENT_OTHER)
Admission: RE | Admit: 2021-04-07 | Discharge: 2021-04-07 | Disposition: A | Discharge status: Home / Self Care | Payer: Medicare HMO | Source: Ambulatory Visit | Attending: Family Medicine | Admitting: Family Medicine

## 2021-04-07 DIAGNOSIS — Z1231 Encounter for screening mammogram for malignant neoplasm of breast: Secondary | ICD-10-CM | POA: Insufficient documentation

## 2021-04-14 ENCOUNTER — Other Ambulatory Visit: Payer: Self-pay | Admitting: Family Medicine

## 2021-04-20 ENCOUNTER — Other Ambulatory Visit: Payer: Self-pay | Admitting: Family Medicine

## 2021-04-25 DIAGNOSIS — Z20822 Contact with and (suspected) exposure to covid-19: Secondary | ICD-10-CM | POA: Diagnosis not present

## 2021-04-25 DIAGNOSIS — Z20828 Contact with and (suspected) exposure to other viral communicable diseases: Secondary | ICD-10-CM | POA: Diagnosis not present

## 2021-04-29 ENCOUNTER — Telehealth (INDEPENDENT_AMBULATORY_CARE_PROVIDER_SITE_OTHER): Payer: Medicare HMO | Admitting: Family Medicine

## 2021-04-29 ENCOUNTER — Encounter: Payer: Self-pay | Admitting: Family Medicine

## 2021-04-29 DIAGNOSIS — K529 Noninfective gastroenteritis and colitis, unspecified: Secondary | ICD-10-CM | POA: Diagnosis not present

## 2021-04-29 MED ORDER — ONDANSETRON 4 MG PO TBDP: 4.0000 mg | ORAL_TABLET | Freq: Three times a day (TID) | ORAL | 0 refills | Status: DC | PRN

## 2021-04-29 NOTE — Progress Notes (Signed)
Chief Complaint  Patient presents with   Fever    headache    Christine [name] here for GI complaints. Due to COVID-19 pandemic, we are interacting via web portal for an electronic face-to-face visit. I verified patient's ID using 2 identifiers. Patient agreed to proceed with visit via this method. Patient is at home, I am at office. Patient and I are present for visit.   Duration: 5 days  Associated symptoms: N/V/D, subj fevers/chills, HA Denies: wheezing, coughing, [name] s/s's Diarrhea is improving overall.  Had ordered a meal from [facility] and believes a piece of cake was bad. Later that evening.  Treatment to date: [place] contacts: No Tested neg for covid.   Past Medical History:  Diagnosis Date   Allergy    dust, pollen, sulfa, prednisone   Anemia    Anxiety    Arthritis    "knees" (04/26/2016)   Colon polyps    benign per pt   Coronary artery disease    mild per 2015 cath in [place] (OM1 30%, RCA 30%)   GERD (gastroesophageal reflux disease)    Gout    History of hiatal hernia    Hypertension    Migraine    "none since early /2017" (05/06/2016)   Obesity    OSA on CPAP    uses CPAP    Objective No conversational dyspnea Age appropriate judgment and insight Nml affect and mood  Gastroenteritis - Plan: ondansetron (ZOFRAN-ODT) 4 MG disintegrating tablet  Sounds like it was from a meal she ate. Zofran prn.  Continue to push fluids w electrolytes while vomiting or having diarrhea. F/u prn. If starting to experience irreplaceable fluid loss, shaking, or shortness of breath, seek immediate care. Pt voiced understanding and agreement to the plan.  [place], DO 04/29/21 7:06 AM

## 2021-05-06 DIAGNOSIS — Z008 Encounter for other general examination: Secondary | ICD-10-CM | POA: Diagnosis not present

## 2021-05-06 DIAGNOSIS — K219 Gastro-esophageal reflux disease without esophagitis: Secondary | ICD-10-CM | POA: Diagnosis not present

## 2021-05-06 DIAGNOSIS — E785 Hyperlipidemia, unspecified: Secondary | ICD-10-CM | POA: Diagnosis not present

## 2021-05-06 DIAGNOSIS — R609 Edema, unspecified: Secondary | ICD-10-CM | POA: Diagnosis not present

## 2021-05-06 DIAGNOSIS — G4733 Obstructive sleep apnea (adult) (pediatric): Secondary | ICD-10-CM | POA: Diagnosis not present

## 2021-05-06 DIAGNOSIS — M1990 Unspecified osteoarthritis, unspecified site: Secondary | ICD-10-CM | POA: Diagnosis not present

## 2021-05-06 DIAGNOSIS — R32 Unspecified urinary incontinence: Secondary | ICD-10-CM | POA: Diagnosis not present

## 2021-05-06 DIAGNOSIS — I2510 Atherosclerotic heart disease of native coronary artery without angina pectoris: Secondary | ICD-10-CM | POA: Diagnosis not present

## 2021-05-06 DIAGNOSIS — I872 Venous insufficiency (chronic) (peripheral): Secondary | ICD-10-CM | POA: Diagnosis not present

## 2021-05-06 DIAGNOSIS — I10 Essential (primary) hypertension: Secondary | ICD-10-CM | POA: Diagnosis not present

## 2021-05-06 DIAGNOSIS — R69 Illness, unspecified: Secondary | ICD-10-CM | POA: Diagnosis not present

## 2021-05-20 ENCOUNTER — Ambulatory Visit (INDEPENDENT_AMBULATORY_CARE_PROVIDER_SITE_OTHER): Payer: Medicare HMO | Admitting: Family Medicine

## 2021-05-20 ENCOUNTER — Encounter: Payer: Self-pay | Admitting: Family Medicine

## 2021-05-20 VITALS — BP 120/85 | HR 75 | Temp 97.8°F | Ht 61.5 in | Wt 291.1 lb

## 2021-05-20 DIAGNOSIS — N3946 Mixed incontinence: Secondary | ICD-10-CM

## 2021-05-20 DIAGNOSIS — D649 Anemia, unspecified: Secondary | ICD-10-CM | POA: Diagnosis not present

## 2021-05-20 DIAGNOSIS — E785 Hyperlipidemia, unspecified: Secondary | ICD-10-CM

## 2021-05-20 DIAGNOSIS — I10 Essential (primary) hypertension: Secondary | ICD-10-CM | POA: Diagnosis not present

## 2021-05-20 DIAGNOSIS — F411 Generalized anxiety disorder: Secondary | ICD-10-CM | POA: Diagnosis not present

## 2021-05-20 DIAGNOSIS — R69 Illness, unspecified: Secondary | ICD-10-CM | POA: Diagnosis not present

## 2021-05-20 HISTORY — DX: Hyperlipidemia, unspecified: E78.5

## 2021-05-20 LAB — COMPREHENSIVE METABOLIC PANEL
ALT: 10 U/L (ref 0–35)
AST: 13 U/L (ref 0–37)
Albumin: 3.9 g/dL (ref 3.5–5.2)
Alkaline Phosphatase: 79 U/L (ref 39–117)
BUN: 24 mg/dL — ABNORMAL HIGH (ref 6–23)
CO2: 27 mEq/L (ref 19–32)
Calcium: 9.1 mg/dL (ref 8.4–10.5)
Chloride: 105 mEq/L (ref 96–112)
Creatinine, Ser: 0.9 mg/dL (ref 0.40–1.20)
GFR: 65.83 mL/min (ref >60.00)
Glucose, Bld: 87 mg/dL (ref 70–99)
Potassium: 3.9 mEq/L (ref 3.5–5.1)
Sodium: 139 mEq/L (ref 135–145)
Total Bilirubin: 0.6 mg/dL (ref 0.2–1.2)
Total Protein: 6.9 g/dL (ref 6.0–8.3)

## 2021-05-20 LAB — CBC
HCT: 32.4 % — ABNORMAL LOW (ref 36.0–46.0)
Hemoglobin: 10.3 g/dL — ABNORMAL LOW (ref 12.0–15.0)
MCHC: 31.7 g/dL (ref 30.0–36.0)
MCV: 86.3 fl (ref 78.0–100.0)
Platelets: 244 10*3/uL (ref 150.0–400.0)
RBC: 3.76 Mil/uL — ABNORMAL LOW (ref 3.87–5.11)
RDW: 15.8 % — ABNORMAL HIGH (ref 11.5–15.5)
WBC: 6.3 10*3/uL (ref 4.0–10.5)

## 2021-05-20 LAB — LIPID PANEL
Cholesterol: 192 mg/dL (ref 0–200)
HDL: 82.4 mg/dL (ref >39.00)
LDL Cholesterol: 90 mg/dL (ref 0–99)
NonHDL: 109.59
Total CHOL/HDL Ratio: 2
Triglycerides: 97 mg/dL (ref 0.0–149.0)
VLDL: 19.4 mg/dL (ref 0.0–40.0)

## 2021-05-20 LAB — IBC + FERRITIN
Ferritin: 93.8 ng/mL (ref 10.0–291.0)
Iron: 56 ug/dL (ref 42–145)
Saturation Ratios: 16.1 % — ABNORMAL LOW (ref 20.0–50.0)
TIBC: 347.2 ug/dL (ref 250.0–450.0)
Transferrin: 248 mg/dL (ref 212.0–360.0)

## 2021-05-20 NOTE — Progress Notes (Signed)
Chief Complaint  Patient presents with   Follow-up    6 month    Subjective Christine [name] is a 71 y.o. female who presents for hypertension follow up. She does not monitor home blood pressures. She is compliant with medications. Patient has these side effects of medication: none She is adhering to a healthy diet overall. Current exercise: none No CP or SOB.  Hyperlipidemia Patient presents for dyslipidemia follow up. Currently being treated with Crestor 10 mg/d and compliance with treatment thus far has been good. She denies myalgias. Diet/exercise as above.  The patient is not known to have coexisting coronary artery disease.  GAD Taking Cymbalta 60 mg/d. Reports compliance, no AE's. No SI or HI. No self medication. Is following with a counselor at this time.   Incontinence Taking Oxybutynin XL 15 mg/d. Reports compliance, no AE's.  Symptoms are well controlled on current dosage.   Past Medical History:  Diagnosis Date   Allergy    dust, pollen, sulfa, prednisone   Anemia    Anxiety    Arthritis    "knees" (04/26/2016)   Colon polyps    benign per pt   Coronary artery disease    mild per 2015 cath in [place] (OM1 30%, RCA 30%)   GERD (gastroesophageal reflux disease)    Gout    History of hiatal hernia    Hyperlipidemia 05/20/2021   Hypertension    Migraine    "none since early /2017" (05/06/2016)   Obesity    OSA on CPAP    uses CPAP    Exam BP 120/85    Pulse 75    Temp 97.8 F (36.6 C) (Oral)    Ht 5' 1.5" (1.562 m)    Wt 291 lb 2 oz (132.1 kg)    SpO2 97%    BMI 54.12 kg/m  General:  well developed, well nourished, in no apparent distress Heart: RRR, no bruits Lungs: clear to auscultation, no accessory muscle use Abdomen: Bowel sounds present, soft, nontender, nondistended Psych: well oriented with normal range of affect and appropriate judgment/insight  Essential hypertension  Hyperlipidemia, unspecified hyperlipidemia type - Plan: Comprehensive  metabolic panel, Lipid panel  Mixed incontinence urge and stress  GAD (generalized anxiety disorder)  Morbid obesity ([place]), Chronic - Plan: Amb ref to Medical Nutrition Therapy-MNT  Anemia, unspecified type - Plan: CBC, IBC + Ferritin  Chronic, stable. Cont Maxzide HCTZ 75-50 mg/d, Counseled on diet and exercise. Chronic, stable.  Continue Crestor 10 mg daily. Chronic, stable.  Continue oxybutynin XL 15 mg daily. Chronic, stable.  Continue Cymbalta 60 mg daily. Refer to the dietitian. F/u in 6 mo for physical or as needed. The patient voiced understanding and agreement to the plan.  [place], DO 05/20/21  11:02 AM

## 2021-05-20 NOTE — Patient Instructions (Signed)
Give us 2-3 business days to get the results of your labs back.   Keep the diet clean and stay active.  If you do not hear anything about your referral in the next 1-2 weeks, call our office and ask for an update.  Let us know if you need anything. 

## 2021-05-26 ENCOUNTER — Other Ambulatory Visit: Payer: Self-pay | Admitting: Family Medicine

## 2021-06-02 DIAGNOSIS — G4733 Obstructive sleep apnea (adult) (pediatric): Secondary | ICD-10-CM | POA: Diagnosis not present

## 2021-06-24 ENCOUNTER — Other Ambulatory Visit: Payer: Self-pay

## 2021-06-24 ENCOUNTER — Ambulatory Visit: Payer: Medicare HMO | Admitting: Podiatry

## 2021-06-24 DIAGNOSIS — M79674 Pain in right toe(s): Secondary | ICD-10-CM

## 2021-06-24 DIAGNOSIS — M79675 Pain in left toe(s): Secondary | ICD-10-CM | POA: Diagnosis not present

## 2021-06-24 DIAGNOSIS — L989 Disorder of the skin and subcutaneous tissue, unspecified: Secondary | ICD-10-CM

## 2021-06-24 DIAGNOSIS — B351 Tinea unguium: Secondary | ICD-10-CM

## 2021-06-24 NOTE — Progress Notes (Signed)
? ? ?Subjective: ?Patient is a 71 y.o. female presenting to the office today for routine foot care.  Patient states that she is unable to trim her own nails and they are painful with shoe gear.  She also has symptomatic calluses that are painful with shoe gear as well.  She presents today for routine foot care ? ?Past Medical History:  ?Diagnosis Date  ? Allergy   ? dust, pollen, sulfa, prednisone  ? Anemia   ? Anxiety   ? Arthritis   ? "knees" (04/26/2016)  ? Colon polyps   ? benign per pt  ? Coronary artery disease   ? mild per 2015 cath in [place] (OM1 30%, RCA 30%)  ? GERD (gastroesophageal reflux disease)   ? Gout   ? History of hiatal hernia   ? Hyperlipidemia 05/20/2021  ? Hypertension   ? Migraine   ? "none since early /2017" (05/06/2016)  ? Obesity   ? OSA on CPAP   ? uses CPAP  ? ?Past Surgical History:  ?Procedure Laterality Date  ? ABDOMINAL HYSTERECTOMY    ? "partial"; both ovaries present  ? APPENDECTOMY  05/06/2016  ? CARDIAC CATHETERIZATION  ~ 2015  ? CARDIAC CATHETERIZATION  2015  ? In [place]  ? CHILECTOMY Right 06/01/2017  ? Procedure: CHILECTOMY RIGHT FOOT;  Surgeon: Edrick Kins, DPM;  Location: [name];  Service: Podiatry;  Laterality: Right;  ? LAPAROSCOPIC APPENDECTOMY N/A 05/06/2016  ? Procedure: LAPAROSCOPIC APPENDECTOMY;  Surgeon: Donnie Mesa, MD;  Location: [facility];  Service: General;  Laterality: N/A;  ? TONSILLECTOMY    ? ?Allergies  ?Allergen Reactions  ? Prednisone Swelling  ?  Legs swell   ? Tape   ? Sulfa Antibiotics Rash  ? ? ?Objective:  ?Physical Exam ?General: Alert and oriented x3 in no acute distress ? ?Dermatology: Hyperkeratotic lesions present on the bilateral feet x 4. Pain on palpation with a central nucleated core noted. Skin is warm, dry and supple bilateral lower extremities. Negative for open lesions or macerations. Nails are tender, long, thickened and dystrophic with subungual debris, consistent with onychomycosis, 1-5 bilateral. No signs of infection  noted. ? ?Vascular: Palpable pedal pulses bilaterally. No edema or erythema noted. Capillary refill within normal limits. ? ?Neurological: Epicritic and protective threshold grossly intact bilaterally.  ? ?Musculoskeletal Exam: Pain on palpation at the keratotic lesions noted. Range of motion within normal limits bilateral. Muscle strength 5/5 in all groups bilateral.   ? ?Assessment: ?1.  Pain due to onychomycosis of toenails both  ?2.  Pre-ulcerative callus lesions noted to the bilateral feet x 4 ? ?Plan of Care:  ?1. Patient evaluated. ?2. Excisional debridement of keratoic lesion using a chisel blade was performed without incident as a courtesy for the patient ?3. Dressed with light dressing. ?4. Mechanical debridement of nails 1-5 bilaterally performed using a nail nipper. Filed with dremel without incident.  ?5.  Continue OTC Motrin 800 mg as needed  ?6.  Recommend good supportive shoes that support the foot  ?7.  Patient is to return to the clinic in 3 months.  ? ?Edrick Kins, DPM ?[place] ? ?Dr. Edrick Kins, DPM  ?  ?[address]                                    ?[place], [place] [address]                ?  Office [phone]  ?Fax [phone] ? ? ? ? ?

## 2021-06-29 DIAGNOSIS — R69 Illness, unspecified: Secondary | ICD-10-CM | POA: Diagnosis not present

## 2021-06-29 DIAGNOSIS — F411 Generalized anxiety disorder: Secondary | ICD-10-CM | POA: Diagnosis not present

## 2021-06-30 DIAGNOSIS — G4733 Obstructive sleep apnea (adult) (pediatric): Secondary | ICD-10-CM | POA: Diagnosis not present

## 2021-07-07 ENCOUNTER — Encounter: Payer: Self-pay | Admitting: Family Medicine

## 2021-07-07 MED ORDER — DULOXETINE HCL 60 MG PO CPEP: 60.0000 mg | ORAL_CAPSULE | Freq: Every day | ORAL | 1 refills | Status: DC

## 2021-07-31 DIAGNOSIS — G4733 Obstructive sleep apnea (adult) (pediatric): Secondary | ICD-10-CM | POA: Diagnosis not present

## 2021-09-11 ENCOUNTER — Encounter: Payer: Self-pay | Admitting: Pulmonary Disease

## 2021-09-11 ENCOUNTER — Ambulatory Visit: Payer: Medicare HMO | Admitting: Pulmonary Disease

## 2021-09-11 VITALS — BP 128/80 | HR 73 | Temp 97.5°F | Ht 61.5 in | Wt 304.4 lb

## 2021-09-11 DIAGNOSIS — Z9989 Dependence on other enabling machines and devices: Secondary | ICD-10-CM | POA: Diagnosis not present

## 2021-09-11 DIAGNOSIS — E669 Obesity, unspecified: Secondary | ICD-10-CM

## 2021-09-11 DIAGNOSIS — G4730 Sleep apnea, unspecified: Secondary | ICD-10-CM | POA: Diagnosis not present

## 2021-09-11 DIAGNOSIS — G4733 Obstructive sleep apnea (adult) (pediatric): Secondary | ICD-10-CM | POA: Diagnosis not present

## 2021-09-11 NOTE — Progress Notes (Signed)
[facility]  Chief Complaint  Patient presents with   Follow-up    OSA on CPAP     Constitutional:  BP 128/80 (BP Location: Left Wrist, Cuff Size: Normal)   Pulse 73   Temp (!) 97.5 F (36.4 C)   Ht 5' 1.5" (1.562 m)   Wt (!) 304 lb 6.4 oz (138.1 kg)   SpO2 98%   BMI 56.58 kg/m   Past Medical History:  Migraine HA, HTN, HH, GERD, Gout, CAD, Colon polyps, OA, Anxiety, Anemia  Past Surgical History:  Christine [name]  has a past surgical history that includes Tonsillectomy; Abdominal hysterectomy; Cardiac catheterization (~ 2015); laparoscopic appendectomy (N/A, 05/06/2016); Cardiac catheterization (2015); Chilectomy (Right, 06/01/2017); and Appendectomy (05/06/2016).  Brief Summary:  Christine [name] is a 71 y.o. female former smoker with obstructive sleep apnea.      Subjective:   Christine [name] is using Mediterranean diet.  Christine [name] was exercising on a regular basis, but hurt her knew.  Her weight is up 15 lbs since her last visit.  Uses CPAP nightly.  Feels smothered sometimes and has mask leak.  Needs new strap.  Physical Exam:   Appearance - well kempt   ENMT - no sinus tenderness, no oral exudate, no LAN, Mallampati 3 airway, no stridor  Respiratory - equal breath sounds bilaterally, no wheezing or rales  CV - s1s2 regular rate and rhythm, no murmurs  Ext - no clubbing, no edema  Skin - no rashes  Psych - normal mood and affect    Sleep Tests:  HST 08/31/17 >> AHI 17.8, SaO2 low 88% Auto CPAP 08/11/21 to 09/03/21 >> used on 30 of 30 nights with average 6 hrs 51 min.  Average AHI 1.3 with median CPAP 10 and 95 th percentile CPAP 13 cm H2O  Cardiac Tests:  Echo 12/03/19 >> EF 60 to 65%, grade 1 DD, mild LA dilation, mild MR  Social History:  Christine [name]  reports that Christine [name] quit smoking about 43 years ago. Her smoking use included cigarettes. Christine [name] has a 0.60 pack-year smoking history. Christine [name] has never used smokeless tobacco. Christine [name] reports that Christine [name] does not  drink alcohol and does not use drugs.  Family History:  Her family history includes Alcohol abuse in her brother; Arthritis in her mother and sister; Asthma in her father and mother; Diabetes in her father, mother, sister, and sister; Drug abuse in her brother; Heart disease in her father and mother; High blood pressure in her father and mother; Varicose Veins in her sister.     Assessment/Plan:   Obstructive sleep apnea. - Christine [name] is compliant with CPAP and reports benefit from therapy - using [employer] for her DME - Christine [name] will get new mask strap; if Christine [name] still has trouble, then plan to change auto CPAP to 5 to 11 cm H2O  Obesity. - encouraged her to keep up with her weight loss efforts - explained how obesity can impact sleep apnea, and weight loss can improve control of sleep apnea - reviewed newer diabetes medicines that can help with weight loss; Christine [name] will follow up with her PCP to discuss in more detail  Time Spent Involved in Patient Care on Day of Examination:  26 minutes  Follow up:   Patient Instructions  Follow up in 1 year  Medication List:   Allergies as of 09/11/2021       Reactions   Prednisone Swelling   Legs swell    Tape    Sulfa Antibiotics  Rash        Medication List        Accurate as of Sep 11, 2021 11:03 AM. If you have any questions, ask your nurse or doctor.          STOP taking these medications    fexofenadine 180 MG tablet Commonly known as: ALLEGRA Stopped by: Chesley Mires, MD   ID Now COVID-19 Kit Generic drug: COVID-19 Test Stopped by: Chesley Mires, MD   ID Now Influenza A & B 2 Kit Stopped by: Chesley Mires, MD   ondansetron 4 MG disintegrating tablet Commonly known as: ZOFRAN-ODT Stopped by: Chesley Mires, MD       TAKE these medications    aspirin EC 81 MG tablet Take by mouth.   DULoxetine 60 MG capsule Commonly known as: CYMBALTA Take 1 capsule (60 mg total) by mouth daily.   esomeprazole 40 MG capsule Commonly known as:  NEXIUM Take 1 capsule (40 mg total) by mouth daily.   ibuprofen 800 MG tablet Commonly known as: ADVIL TAKE 1 TABLET BY MOUTH EVERY 8 HOURS AS NEEDED   metoprolol tartrate 50 MG tablet Commonly known as: LOPRESSOR TAKE 1 TABLET BY MOUTH EVERY DAY   oxybutynin 15 MG 24 hr tablet Commonly known as: DITROPAN XL Take 1 tablet (15 mg total) by mouth at bedtime.   rosuvastatin 10 MG tablet Commonly known as: Crestor Take 1 tablet (10 mg total) by mouth daily.   triamterene-hydrochlorothiazide 75-50 MG tablet Commonly known as: MAXZIDE TAKE 1 TABLET BY MOUTH EVERY DAY        Signature:  Chesley Mires, MD [place] Pager - [phone] 09/11/2021, 11:03 AM

## 2021-09-11 NOTE — Patient Instructions (Signed)
Follow up in 1 year.

## 2021-09-15 ENCOUNTER — Encounter: Payer: Self-pay | Admitting: Family Medicine

## 2021-09-15 ENCOUNTER — Other Ambulatory Visit: Payer: Self-pay | Admitting: Family Medicine

## 2021-09-15 DIAGNOSIS — N3946 Mixed incontinence: Secondary | ICD-10-CM

## 2021-09-22 DIAGNOSIS — G4733 Obstructive sleep apnea (adult) (pediatric): Secondary | ICD-10-CM | POA: Diagnosis not present

## 2021-09-28 ENCOUNTER — Ambulatory Visit (INDEPENDENT_AMBULATORY_CARE_PROVIDER_SITE_OTHER): Payer: Medicare HMO | Admitting: Podiatry

## 2021-09-28 DIAGNOSIS — M79675 Pain in left toe(s): Secondary | ICD-10-CM

## 2021-09-28 DIAGNOSIS — M79674 Pain in right toe(s): Secondary | ICD-10-CM | POA: Diagnosis not present

## 2021-09-28 DIAGNOSIS — B351 Tinea unguium: Secondary | ICD-10-CM | POA: Diagnosis not present

## 2021-09-28 NOTE — Progress Notes (Signed)
    Subjective: Patient is a 71 y.o. female presenting to the office today for routine foot care.  Patient states that she is unable to trim her own nails and they are painful with shoe gear.  She also has symptomatic calluses that are painful with shoe gear as well.  She presents today for routine foot care  Past Medical History:  Diagnosis Date   Allergy    dust, pollen, sulfa, prednisone   Anemia    Anxiety    Arthritis    "knees" (04/26/2016)   Colon polyps    benign per pt   Coronary artery disease    mild per 2015 cath in [place] (OM1 30%, RCA 30%)   GERD (gastroesophageal reflux disease)    Gout    History of hiatal hernia    Hyperlipidemia 05/20/2021   Hypertension    Migraine    "none since early /2017" (05/06/2016)   Obesity    OSA on CPAP    uses CPAP   Past Surgical History:  Procedure Laterality Date   ABDOMINAL HYSTERECTOMY     "partial"; both ovaries present   APPENDECTOMY  05/06/2016   CARDIAC CATHETERIZATION  ~ 2015   CARDIAC CATHETERIZATION  2015   In [place] Right 06/01/2017   Procedure: [place];  Surgeon: Edrick Kins, DPM;  Location: [facility];  Service: Podiatry;  Laterality: Right;   LAPAROSCOPIC APPENDECTOMY N/A 05/06/2016   Procedure: LAPAROSCOPIC APPENDECTOMY;  Surgeon: Donnie Mesa, MD;  [facility];  Service: General;  Laterality: N/A;   TONSILLECTOMY     Allergies  Allergen Reactions   Prednisone Swelling    Legs swell    Tape    Sulfa Antibiotics Rash    Objective:  Physical Exam General: Alert and oriented x3 in no acute distress  Dermatology: Hyperkeratotic lesions present on the bilateral feet x 4. Pain on palpation with a central nucleated core noted. Skin is warm, dry and supple bilateral lower extremities. Negative for open lesions or macerations. Nails are tender, long, thickened and dystrophic with subungual debris, consistent with onychomycosis, 1-5 bilateral. No signs of infection  noted.  Vascular: Palpable pedal pulses bilaterally. No edema or erythema noted. Capillary refill within normal limits.  Neurological: Epicritic and protective threshold grossly intact bilaterally.   Musculoskeletal Exam: Pain on palpation at the keratotic lesions noted. Range of motion within normal limits bilateral. Muscle strength 5/5 in all groups bilateral.    Assessment: 1.  Pain due to onychomycosis of toenails both  2.  Pre-ulcerative callus lesions noted to the bilateral feet x 4  Plan of Care:  1. Patient evaluated. 2. Mechanical debridement of nails 1-5 bilaterally performed using a nail nipper. Filed with dremel without incident.  3.  Recommend good supportive shoes that support the foot  4.  Patient is to return to the clinic in 3 months.   Edrick Kins, DPM [facility]  Dr. Edrick Kins, DPM    [address], [place] [address]                Office [phone]  Fax [phone]

## 2021-09-29 ENCOUNTER — Encounter: Payer: Self-pay | Admitting: Family Medicine

## 2021-09-29 MED ORDER — IBUPROFEN 800 MG PO TABS: 800.0000 mg | ORAL_TABLET | Freq: Three times a day (TID) | ORAL | 0 refills | Status: DC | PRN

## 2021-09-29 MED ORDER — METOPROLOL TARTRATE 50 MG PO TABS: 50.0000 mg | ORAL_TABLET | Freq: Every day | ORAL | 1 refills | Status: DC

## 2021-10-08 ENCOUNTER — Encounter: Payer: Self-pay | Admitting: Pulmonary Disease

## 2021-10-27 DIAGNOSIS — S0191XA Laceration without foreign body of unspecified part of head, initial encounter: Secondary | ICD-10-CM | POA: Diagnosis not present

## 2021-10-27 DIAGNOSIS — W19XXXA Unspecified fall, initial encounter: Secondary | ICD-10-CM | POA: Diagnosis not present

## 2021-10-27 DIAGNOSIS — S0181XA Laceration without foreign body of other part of head, initial encounter: Secondary | ICD-10-CM | POA: Diagnosis not present

## 2021-10-27 DIAGNOSIS — W010XXA Fall on same level from slipping, tripping and stumbling without subsequent striking against object, initial encounter: Secondary | ICD-10-CM | POA: Diagnosis not present

## 2021-10-27 DIAGNOSIS — Z753 Unavailability and inaccessibility of health-care facilities: Secondary | ICD-10-CM | POA: Diagnosis not present

## 2021-10-27 DIAGNOSIS — Y9389 Activity, other specified: Secondary | ICD-10-CM | POA: Diagnosis not present

## 2021-10-27 DIAGNOSIS — Y9289 Other specified places as the place of occurrence of the external cause: Secondary | ICD-10-CM | POA: Diagnosis not present

## 2021-10-27 DIAGNOSIS — S0990XA Unspecified injury of head, initial encounter: Secondary | ICD-10-CM | POA: Diagnosis not present

## 2021-11-02 ENCOUNTER — Encounter: Payer: Self-pay | Admitting: Family Medicine

## 2021-11-02 ENCOUNTER — Ambulatory Visit (INDEPENDENT_AMBULATORY_CARE_PROVIDER_SITE_OTHER): Payer: Medicare HMO | Admitting: Family Medicine

## 2021-11-02 VITALS — BP 138/78 | HR 101 | Temp 97.6°F | Ht 61.5 in | Wt 305.5 lb

## 2021-11-02 DIAGNOSIS — S060X0A Concussion without loss of consciousness, initial encounter: Secondary | ICD-10-CM

## 2021-11-02 NOTE — Progress Notes (Signed)
Chief Complaint  Patient presents with   Suture / Staple Removal    Subjective: Patient is a 71 y.o. female here for suture removal.  Fell and hit her head around 1 week ago. She went to sit down and the chair was not where she thought it was. She hit her R arm and R side of head requiring 3 sutures. She is here for removal. She continues to have a headache, poor concentration, sleeping more than usual, balance issues. No mood changes, difficulty swallowing, trouble w speech. She does not have a hx of head injuries.   Past Medical History:  Diagnosis Date   Allergy    dust, pollen, sulfa, prednisone   Anemia    Anxiety    Arthritis    "knees" (04/26/2016)   Colon polyps    benign per pt   Coronary artery disease    mild per 2015 cath in [place] (OM1 30%, RCA 30%)   GERD (gastroesophageal reflux disease)    Gout    History of hiatal hernia    Hyperlipidemia 05/20/2021   Hypertension    Migraine    "none since early /2017" (05/06/2016)   Obesity    OSA on CPAP    uses CPAP    Objective: BP 138/78   Pulse (!) 101   Temp 97.6 F (36.4 C) (Oral)   Ht 5' 1.5" (1.562 m)   Wt (!) 305 lb 8 oz (138.6 kg)   SpO2 97%   BMI 56.79 kg/m  General: Awake, appears stated age Skin: Excoriated laceration w 3 simple sutures over R hairline without fluctuance, ttp, erythema, drainage Neuro: Gait slow, DTR's equal and symmetric w/o clonus or cerebellar signs, CN2-12 grossly intact Lungs:  No accessory muscle use Psych: Age appropriate judgment and insight, normal affect and mood  Assessment and Plan: Concussion without loss of consciousness, initial encounter  3 simple sutures successfully removed. Aftercare instructions verbalized. She had a concussion. OTC supps rec'd in paperwork. I will see her for a CPE at the end of the mo and will be referring her to concussion clinic if no improvement. CT head in ER neg.  The patient voiced understanding and agreement to the plan.  [place], DO 11/02/21  3:38 PM

## 2021-11-02 NOTE — Patient Instructions (Addendum)
Topical Vit E can be helpful for scarring.   Fish oil 3 grams daily for 10 days then 2 grams daily  Vitamin D 4000 IU daily  CoQ10 '200mg'$  daily for headaches  Tart cherry extract any dose at night  To help improve COGNITIVE function: Using fish oil/omega 3 that is 1000 mg (or roughly 600 mg EPA/DHA), starting as soon as possible after concussion, take: 3 tabs THREE TIMES a day  for the first 3 days, then (you will smell a little, sorry) 3 tabs TWICE DAILY  for the next 3 days, then 3 tabs ONCE DAILY  for the next 10 days    To help reduce HEADACHES: Coenzyme Q10 '160mg'$  ONCE DAILY Riboflavin/Vitamin B2 '400mg'$  ONCE DAILY Magnesium oxide 400 mg ONE-TWO TIMES DAILY May stop after headaches are resolved.                                                                                               To help with INSOMNIA: Melatonin 3-'5mg'$  AT BEDTIME Tart cherry extract, any dose at night    Other medicines to help decrease inflammation Alpha Lipoic Acid '100mg'$  TWICE DAILY Turmeric '500mg'$  twice daily Iron '65mg'$  elemental daily Vitamin D 4000 IU daily for 2 weeks then 2000 IU daily thereafter.  Let us know if you need anything.

## 2021-11-17 ENCOUNTER — Encounter: Payer: Medicare HMO | Admitting: Family Medicine

## 2021-11-19 ENCOUNTER — Encounter: Payer: Self-pay | Admitting: Family Medicine

## 2021-11-19 ENCOUNTER — Other Ambulatory Visit: Payer: Self-pay | Admitting: Family Medicine

## 2021-11-19 ENCOUNTER — Ambulatory Visit (INDEPENDENT_AMBULATORY_CARE_PROVIDER_SITE_OTHER): Payer: Medicare HMO | Admitting: Family Medicine

## 2021-11-19 ENCOUNTER — Other Ambulatory Visit (INDEPENDENT_AMBULATORY_CARE_PROVIDER_SITE_OTHER): Payer: Medicare HMO

## 2021-11-19 VITALS — BP 130/76 | HR 69 | Temp 97.7°F | Ht 61.0 in | Wt 301.5 lb

## 2021-11-19 DIAGNOSIS — R739 Hyperglycemia, unspecified: Secondary | ICD-10-CM

## 2021-11-19 DIAGNOSIS — S060X0A Concussion without loss of consciousness, initial encounter: Secondary | ICD-10-CM | POA: Diagnosis not present

## 2021-11-19 DIAGNOSIS — D649 Anemia, unspecified: Secondary | ICD-10-CM

## 2021-11-19 DIAGNOSIS — Z0000 Encounter for general adult medical examination without abnormal findings: Secondary | ICD-10-CM | POA: Diagnosis not present

## 2021-11-19 DIAGNOSIS — N3946 Mixed incontinence: Secondary | ICD-10-CM | POA: Diagnosis not present

## 2021-11-19 LAB — CBC
HCT: 32.5 % — ABNORMAL LOW (ref 36.0–46.0)
Hemoglobin: 10.2 g/dL — ABNORMAL LOW (ref 12.0–15.0)
MCHC: 31.5 g/dL (ref 30.0–36.0)
MCV: 87.9 fl (ref 78.0–100.0)
Platelets: 233 10*3/uL (ref 150.0–400.0)
RBC: 3.69 Mil/uL — ABNORMAL LOW (ref 3.87–5.11)
RDW: 15.3 % (ref 11.5–15.5)
WBC: 5.4 10*3/uL (ref 4.0–10.5)

## 2021-11-19 LAB — COMPREHENSIVE METABOLIC PANEL
ALT: 12 U/L (ref 0–35)
AST: 14 U/L (ref 0–37)
Albumin: 4 g/dL (ref 3.5–5.2)
Alkaline Phosphatase: 78 U/L (ref 39–117)
BUN: 29 mg/dL — ABNORMAL HIGH (ref 6–23)
CO2: 25 mEq/L (ref 19–32)
Calcium: 9.3 mg/dL (ref 8.4–10.5)
Chloride: 108 mEq/L (ref 96–112)
Creatinine, Ser: 1.03 mg/dL (ref 0.40–1.20)
GFR: 55.79 mL/min — ABNORMAL LOW (ref >60.00)
Glucose, Bld: 103 mg/dL — ABNORMAL HIGH (ref 70–99)
Potassium: 3.9 mEq/L (ref 3.5–5.1)
Sodium: 141 mEq/L (ref 135–145)
Total Bilirubin: 0.6 mg/dL (ref 0.2–1.2)
Total Protein: 6.9 g/dL (ref 6.0–8.3)

## 2021-11-19 LAB — LIPID PANEL
Cholesterol: 179 mg/dL (ref 0–200)
HDL: 70.1 mg/dL (ref >39.00)
LDL Cholesterol: 87 mg/dL (ref 0–99)
NonHDL: 108.98
Total CHOL/HDL Ratio: 3
Triglycerides: 111 mg/dL (ref 0.0–149.0)
VLDL: 22.2 mg/dL (ref 0.0–40.0)

## 2021-11-19 LAB — HEMOGLOBIN A1C: Hgb A1c MFr Bld: 6.1 % (ref 4.6–6.5)

## 2021-11-19 MED ORDER — IBUPROFEN 800 MG PO TABS: 800.0000 mg | ORAL_TABLET | Freq: Three times a day (TID) | ORAL | 0 refills | Status: DC | PRN

## 2021-11-19 NOTE — Patient Instructions (Addendum)
Give [place] 2-3 business days to get the results of your labs back.   Keep the diet clean and stay active.  Please get me a copy of your advanced directive form at your convenience.   If you do not hear anything about your referral in the next 1-2 weeks, call our office and ask for an update.  Let us know if you need anything.

## 2021-11-19 NOTE — Progress Notes (Signed)
Chief Complaint  Patient presents with   Annual Exam     Well Woman Christine [name] is here for a complete physical.   Her last physical was >1 year ago.  Current diet: in general, a "healthy" diet. Current exercise: none lately . Weight is stable and she denies daytime fatigue. Seatbelt? Yes Advanced directive? Yes  Health Maintenance Colonoscopy- Yes Shingrix- Yes DEXA- Yes Mammogram- Yes Tetanus- Yes Pneumonia- Yes Hep C screen- Yes  Obesity Struggling with weight for years. She has failed diet/exercise and has no interest in shots. She saw a nutritionist in the past and has tried fad diets. Diet/exercise as above.   Past Medical History:  Diagnosis Date   Allergy    dust, pollen, sulfa, prednisone   Anemia    Anxiety    Arthritis    "knees" (04/26/2016)   Colon polyps    benign per pt   Coronary artery disease    mild per 2015 cath in [place] (OM1 30%, RCA 30%)   GERD (gastroesophageal reflux disease)    Gout    History of hiatal hernia    Hyperlipidemia 05/20/2021   Hypertension    Migraine    "none since early /2017" (05/06/2016)   Obesity    OSA on CPAP    uses CPAP     Past Surgical History:  Procedure Laterality Date   ABDOMINAL HYSTERECTOMY     "partial"; both ovaries present   APPENDECTOMY  05/06/2016   CARDIAC CATHETERIZATION  ~ 2015   CARDIAC CATHETERIZATION  2015   In [place] Right 06/01/2017   Procedure: [place];  Surgeon: Edrick Kins, DPM;  Location: [facility];  Service: Podiatry;  Laterality: Right;   LAPAROSCOPIC APPENDECTOMY N/A 05/06/2016   Procedure: LAPAROSCOPIC APPENDECTOMY;  Surgeon: Donnie Mesa, MD;  [facility];  Service: General;  Laterality: N/A;   TONSILLECTOMY      Medications  Current Outpatient Medications on File Prior to Visit  Medication Sig Dispense Refill   aspirin 81 MG EC tablet Take by mouth.     DULoxetine (CYMBALTA) 60 MG capsule Take 1 capsule (60 mg total) by mouth daily. 90  capsule 1   esomeprazole (NEXIUM) 40 MG capsule Take 1 capsule (40 mg total) by mouth daily. 90 capsule 1   metoprolol tartrate (LOPRESSOR) 50 MG tablet Take 1 tablet (50 mg total) by mouth daily. 90 tablet 1   oxybutynin (DITROPAN XL) 15 MG 24 hr tablet TAKE ONE TABLET BY MOUTH EVERY NIGHT AT BEDTIME 30 tablet 11   rosuvastatin (CRESTOR) 10 MG tablet Take 1 tablet (10 mg total) by mouth daily. 90 tablet 3   triamterene-hydrochlorothiazide (MAXZIDE) 75-50 MG tablet TAKE 1 TABLET BY MOUTH EVERY DAY 30 tablet 8    Allergies Allergies  Allergen Reactions   Prednisone Swelling    Legs swell    Tape    Sulfa Antibiotics Rash    Review of Systems: Constitutional:  no fevers Eye:  no recent significant change in vision Ears:  No changes in hearing Nose/Mouth/Throat:  no complaints of nasal congestion, no sore throat Cardiovascular: no chest pain Respiratory:  No shortness of breath Gastrointestinal:  No change in bowel habits GU:  Female: negative for dysuria Integumentary:  no abnormal skin lesions reported Neurologic:  no headaches Endocrine:  denies unexplained weight changes  Exam BP 130/76   Pulse 69   Temp 97.7 F (36.5 C) (Oral)   Ht '5\' 1"'$  (1.549 m)   Wt Marland Kitchen)  301 lb 8 oz (136.8 kg)   SpO2 93%   BMI 56.97 kg/m  General:  well developed, well nourished, in no apparent distress Skin:  no significant moles, warts, or growths Head:  no masses, lesions, or tenderness Eyes:  pupils equal and round, sclera anicteric without injection Ears:  canals without lesions, TMs shiny without retraction, no obvious effusion, no erythema Nose:  nares patent, septum midline, mucosa normal, and no drainage or sinus tenderness Throat/Pharynx:  lips and gingiva without lesion; tongue and uvula midline; non-inflamed pharynx; no exudates or postnasal drainage Neck: neck supple without adenopathy, thyromegaly, or masses Lungs:  clear to auscultation, breath sounds equal bilaterally, no  respiratory distress Cardio:  regular rate and rhythm, no bruits or LE edema Abdomen:  abdomen soft, nontender; bowel sounds normal; no masses or organomegaly Genital: Deferred Neuro:  gait normal; deep tendon reflexes normal and symmetric Psych: well oriented with normal range of affect and appropriate judgment/insight  Assessment and Plan  Well adult exam  Concussion without loss of consciousness, initial encounter - Plan: Ambulatory referral to Sports Medicine  Mixed incontinence urge and stress - Plan: Comprehensive metabolic panel, Lipid panel  Morbid obesity ([place]) - Plan: Amb Referral to Bariatric Surgery  Anemia, unspecified type - Plan: CBC   Well 71 y.o. female. Counseled on diet and exercise. Advanced directive form requested today.  Still having concussive s/s's w excessive sleep and poor concentration. Will refer to concussion clinic.  Obesity: Chronic. Refer bariatric surg team.  Other orders as above. Follow up in 6 mo. The patient voiced understanding and agreement to the plan.  [place], DO 11/19/21 10:05 AM

## 2021-11-24 NOTE — Progress Notes (Unsigned)
Subjective:   I, Peterson Lombard, LAT, ATC acting as a scribe for Lynne Leader, MD.  Chief Complaint: Christine [name],  is a 71 y.o. female who presents for initial evaluation of a head injury. On 10/27/21, pt went to sit down in a chair and the chair was not where she thought it was and she fell hitting her head on the corner of the refridgerator, requiring 3 sutures. Pt was seen by her PCP on 7/10 for suture removal and c/o cont'd HA, difficulty concentrating, sleeping more than usual, and balance issues. Today, pt reports HA and tinnitus have stopped. Pt c/o memory issues, issues concentrating, sleeping more than usual, and R upper arm pain.  Upper arm/shoulder pain right side occurs with arm abduction and internal rotation.  She has been doing some home exercises on her own which seems to be helping some.   Injury date: 10/27/21 Visit #: 1  History of Present Illness:   Concussion Self-Reported Symptom Score Symptoms rated on a scale 1-6, in last 24 hours   Headache: 0    Nausea: 0  Dizziness: 1  Vomiting: 0  Balance Difficulty: 2   Trouble Falling Asleep: 0   Fatigue: 2  Sleep Less Than Usual: 0  Daytime Drowsiness: 2  Sleep More Than Usual: 4  Photophobia: 0  Phonophobia: 0  Irritability: 1  Sadness: 0  Numbness or Tingling: 0  Nervousness: 0  Feeling More Emotional: 0  Feeling Mentally Foggy: 2  Feeling Slowed Down: 3  Memory Problems: 2  Difficulty Concentrating: 3  Visual Problems: 0  Total # Symptoms: 11/22  Total Symptom Score: 22/132  Neck Pain: No Tinnitus: No- has resolved  Review of Systems: No fevers or chills    Review of History: Hypertension.  Objective:    Physical Examination Vitals:   11/25/21 1046  BP: 108/72  Pulse: 68  SpO2: 99%   MSK: Right shoulder: Normal-appearing Normal motion pain with abduction. Intact strength.  Positive Hawkins and Neer's test. Neuro: Alert and oriented.  Normal coordination.  Antalgic gait. Psych: Normal  speech thought process and affect.     Assessment and Plan   71 y.o. female with concussion.  Concussion occurred about a month ago.  Symptoms are slowly improving.  She notes some continued cognitive symptoms that are problematic.  Plan for cognitive rehab with speech therapy.  Referral placed today.  Additionally she notes shoulder pain.  We will try some home exercise program.  However if needed we will add physical therapy.  She also notes increased sleep requirements.  She does have sleep apnea but notes that she is using her CPAP machine nightly without change.  Recheck in 1 month.      Action/Discussion: Reviewed diagnosis, management options, expected outcomes, and the reasons for scheduled and emergent follow-up. Questions were adequately answered. Patient expressed verbal understanding and agreement with the following plan.     Patient Education: Reviewed with patient the risks (i.e, a repeat concussion, post-concussion syndrome, second-impact syndrome) of returning to play prior to complete resolution, and thoroughly reviewed the signs and symptoms of concussion.Reviewed need for complete resolution of all symptoms, with rest AND exertion, prior to return to play. Reviewed red flags for urgent medical evaluation: worsening symptoms, nausea/vomiting, intractable headache, musculoskeletal changes, focal neurological deficits. [facility]'s Concussion Care Plan, which clearly outlines the plans stated above, was given to patient.   Level of service: Total encounter time 30 minutes including face-to-face time with the patient and, reviewing past  medical record, and charting on the date of service.        After Visit Summary printed out and provided to patient as appropriate.  The above documentation has been reviewed and is accurate and complete Lynne Leader

## 2021-11-25 ENCOUNTER — Ambulatory Visit: Payer: Medicare HMO | Admitting: Family Medicine

## 2021-11-25 VITALS — BP 108/72 | HR 68 | Wt 301.6 lb

## 2021-11-25 DIAGNOSIS — R69 Illness, unspecified: Secondary | ICD-10-CM | POA: Diagnosis not present

## 2021-11-25 DIAGNOSIS — S060X0A Concussion without loss of consciousness, initial encounter: Secondary | ICD-10-CM

## 2021-11-25 DIAGNOSIS — F09 Unspecified mental disorder due to known physiological condition: Secondary | ICD-10-CM | POA: Diagnosis not present

## 2021-11-25 DIAGNOSIS — M25511 Pain in right shoulder: Secondary | ICD-10-CM | POA: Diagnosis not present

## 2021-11-25 NOTE — Patient Instructions (Signed)
Thank you for coming in today.   Plan for cognitive rehab with speech therapy  Recheck in about 1 month.   If you shoulder/arm bothers you enough to do PT let me know. I can add that with a phone call or mychart message.   Home exercises for rotator cuff tendonitis or strain would be good.

## 2021-11-27 ENCOUNTER — Ambulatory Visit: Payer: Medicare HMO | Attending: Family Medicine

## 2021-11-27 DIAGNOSIS — R41841 Cognitive communication deficit: Secondary | ICD-10-CM | POA: Insufficient documentation

## 2021-11-27 DIAGNOSIS — R69 Illness, unspecified: Secondary | ICD-10-CM | POA: Diagnosis not present

## 2021-11-27 DIAGNOSIS — F801 Expressive language disorder: Secondary | ICD-10-CM | POA: Insufficient documentation

## 2021-11-27 NOTE — Therapy (Signed)
OUTPATIENT SPEECH LANGUAGE PATHOLOGY EVALUATION   Patient Name: Christine [name] MRN: [id number] DOB:1951-01-16, 71 y.o., female Today's Date: 11/27/2021  PCP: Shelda Pal, DO REFERRING PROVIDER: Gregor Hams, MD    End of Session - 11/27/21 1432     Visit Number 1    Number of Visits 17    Date for SLP Re-Evaluation 01/22/22    Authorization Type [employer] Medicare    Authorization Time Period $35 copay    SLP Start Time 1402    SLP Stop Time  1447    SLP Time Calculation (min) 45 min    Activity Tolerance Patient tolerated treatment well             Past Medical History:  Diagnosis Date   Allergy    dust, pollen, sulfa, prednisone   Anemia    Anxiety    Arthritis    "knees" (04/26/2016)   Colon polyps    benign per pt   Coronary artery disease    mild per 2015 cath in [place] (OM1 30%, RCA 30%)   GERD (gastroesophageal reflux disease)    Gout    History of hiatal hernia    Hyperlipidemia 05/20/2021   Hypertension    Migraine    "none since early /2017" (05/06/2016)   Obesity    OSA on CPAP    uses CPAP   Past Surgical History:  Procedure Laterality Date   ABDOMINAL HYSTERECTOMY     "partial"; both ovaries present   APPENDECTOMY  05/06/2016   CARDIAC CATHETERIZATION  ~ 2015   CARDIAC CATHETERIZATION  2015   In [place] Right 06/01/2017   Procedure: CHILECTOMY RIGHT FOOT;  Surgeon: Edrick Kins, DPM;  Location: [facility];  Service: Podiatry;  Laterality: Right;   LAPAROSCOPIC APPENDECTOMY N/A 05/06/2016   Procedure: LAPAROSCOPIC APPENDECTOMY;  Surgeon: Donnie Mesa, MD;  Location: [facility];  Service: General;  Laterality: N/A;   TONSILLECTOMY     Patient Active Problem List   Diagnosis Date Noted   Hyperlipidemia 05/20/2021   Lumbar radiculopathy 12/24/2020   Trigger little finger of left hand 12/16/2020   Medial epicondylitis of elbow, left 12/16/2020   Memory difficulties 12/11/2020   Lipodermatosclerosis of both lower extremities  04/30/2020   Globus sensation 11/15/2019   Laryngopharyngeal reflux (LPR) 11/15/2019   Throat clearing 10/05/2019   Gout 08/31/2019   Phlebitis 08/31/2019   Prediabetes 08/31/2019   OA (osteoarthritis) of knee 05/17/2019   Acute pain of right knee 05/17/2019   Abdominal pain 03/31/2019   Morbid obesity ([place]) 03/29/2018   GAD (generalized anxiety disorder) 02/27/2018   Mixed incontinence urge and stress 02/27/2018   OSA on CPAP 06/16/2017   Acute appendicitis 05/06/2016   Gastro-esophageal reflux disease with esophagitis 07/26/2011   Essential hypertension 07/26/2011   Osteoarthrosis 10/16/2009   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 female who presents for initial evaluation of a head injury. On 10/27/21, pt went to sit  down in a chair and the chair was not where she thought it was and she fell hitting her head on the corner of the refridgerator, requiring 3 sutures. Pt was seen by her PCP on 7/10 for suture removal and c/o cont'd HA, difficulty concentrating, sleeping more than usual, and balance issues. Today, pt reports HA and tinnitus have stopped. Pt c/o memory issues, issues concentrating, sleeping more than usual, and R upper arm pain.   PAIN:  Are you having pain? No  FALLS: Has patient fallen in last 6 months?  Yes; reason for concusison  LIVING ENVIRONMENT: Lives with: lives  alone Lives in: House/apartment  PLOF:  Level of assistance: Independent with ADLs, Independent with IADLs Employment: Retired Arts administrator - research)   PATIENT GOALS "to get back to some normalcy"   OBJECTIVE:   COGNITION: Overall cognitive status: Impaired Areas of impairment:  Attention: Impaired: Sustained, Alternating, Divided Memory: Impaired: Working, Chiropodist function: Impaired: Organization, Planning, and Slow processing Functional deficits: Pt endorsed difficulty with multi-tasking (gets confused/lost), difficulty with reading comprehension and attention while reading, word finding (searching for words), and getting lost while driving.   COGNITIVE COMMUNICATION   Following directions: Follows multi-step commands consistently    Auditory comprehension: WFL Verbal expression: Impaired: see below Functional communication: Impaired: word finding reported and demonstrated - pt exhibited anomia x2 in conversation today (repaired with extra time x1)  ORAL MOTOR EXAMINATION Overall status: WFL  STANDARDIZED ASSESSMENTS: CLQT: Attention: WNL, Memory: WNL, Executive Function: WNL, Language: WNL, Visuospatial Skills: WNL, and Clock Drawing: WNL Generative Naming: 5/10 ("I drew a blank")   PATIENT REPORTED OUTCOME MEASURES (PROM): Cognitive Function: Provided to patient to complete and return next session    TODAY'S TREATMENT:  11-27-21: Educated patient on clinical observations and plan to initiate POC to address cognitive concerns and train compensatory strategies. Pt verbalized understanding and agreement with POC. Requested 1x/week due to high co-pay.    PATIENT EDUCATION: Education details: see above Person educated: Patient Education method: Customer service manager Education comprehension: verbalized understanding, returned demonstration, and needs further education     GOALS: Goals reviewed with patient? Yes  SHORT TERM GOALS:  Target date: 12/25/2021    Pt will verbalize and implement attention strategies to optimize household task completion and topic maintenance given occasional min A  Baseline: Goal status: INITIAL  2.  Pt will utilize word retrieval strategies in 10-15 minute conversation given occasional min A  Baseline:  Goal status: INITIAL  3.  Pt will verbalize and implement reading strategies to optimize comprehension and attention while reading multiple paragraphs given occasional min A  Baseline:  Goal status: INITIAL  4.  Pt will utilize pre-planning and/or external aids as necessary while driving to ensure safety and timely arrival given occasional min A Baseline:  Goal status: INITIAL   LONG TERM GOALS: Target date: 01/22/2022   Pt will verbalize and implement attention strategies to optimize household task completion and topic maintenance given rare min A  Baseline:  Goal status: INITIAL  2.  Pt will utilize word retrieval strategies in 30+ minute conversation given rare min A  Baseline:  Goal status: INITIAL  3.  Pt will verbalize and carryover reading strategies to optimize comprehension and attention while reading a chapter a night > 1 week  Baseline:  Goal status: INITIAL  4.  Pt will report improved cognitive linguistic functioning via PROM by 5 points at last ST session  Baseline: CF given at eval  Goal status:  INITIAL   ASSESSMENT:  CLINICAL IMPRESSION: Patient is a 71 y.o. female who was seen today for concussion after falling 10-27-21. Pt reports changes in attention, including difficulty multi-tasking (prior strength) resulting in confusion and getting lost within task, difficulty reading (previously read a chapter a night but has difficulty attending and comprehending now), searching for the words, and getting lost while driving. Pt exhibited anomia x2 in conversation (in which pt able to self-correct x1). Pt is reportedly managing bills, medications, and appointments by  writing things down with success. Skilled ST intervention is warranted to optimize cognitive linguistic functioning to address aforementioned deficits and maximize return to baseline.   OBJECTIVE IMPAIRMENTS include attention, memory, and executive functioning. These impairments are limiting patient from household responsibilities. Factors affecting potential to achieve goals and functional outcome are  N/A . Patient will benefit from skilled SLP services to address above impairments and improve overall function.  REHAB POTENTIAL: Good  PLAN: SLP FREQUENCY: 2x/week (pt elected for 1x/week for high co-pay)  SLP DURATION: 8 weeks  PLANNED INTERVENTIONS: Language facilitation, Environmental controls, Cueing hierachy, Cognitive reorganization, Internal/external aids, Functional tasks, Multimodal communication approach, SLP instruction and feedback, Compensatory strategies, and Patient/family education    Marzetta Board, CCC-SLP 11/27/2021, 3:55 PM

## 2021-12-07 ENCOUNTER — Ambulatory Visit: Payer: Medicare HMO | Admitting: Speech Pathology

## 2021-12-16 ENCOUNTER — Ambulatory Visit: Payer: Medicare HMO | Admitting: Speech Pathology

## 2021-12-16 ENCOUNTER — Encounter: Payer: Self-pay | Admitting: Speech Pathology

## 2021-12-16 DIAGNOSIS — R41841 Cognitive communication deficit: Secondary | ICD-10-CM

## 2021-12-16 DIAGNOSIS — F801 Expressive language disorder: Secondary | ICD-10-CM

## 2021-12-16 DIAGNOSIS — R69 Illness, unspecified: Secondary | ICD-10-CM | POA: Diagnosis not present

## 2021-12-16 NOTE — Therapy (Signed)
OUTPATIENT SPEECH LANGUAGE PATHOLOGY TREATMENT NOTE   Patient Name: Christine [name] MRN: [id number] DOB:Apr 23, 1951, 71 y.o., female Today's Date: 12/16/2021  PCP: Shelda Pal, DO  REFERRING PROVIDER: Gregor Hams, MD    END OF SESSION:   End of Session - 12/16/21 1321     Visit Number 2    Number of Visits 17    Date for SLP Re-Evaluation 01/22/22    Authorization Type [employer] Medicare    Authorization Time Period $35 copay    SLP Start Time 1315    SLP Stop Time  1400    SLP Time Calculation (min) 45 min    Activity Tolerance Patient tolerated treatment well             Past Medical History:  Diagnosis Date   Allergy    dust, pollen, sulfa, prednisone   Anemia    Anxiety    Arthritis    "knees" (04/26/2016)   Colon polyps    benign per pt   Coronary artery disease    mild per 2015 cath in [place] (OM1 30%, RCA 30%)   GERD (gastroesophageal reflux disease)    Gout    History of hiatal hernia    Hyperlipidemia 05/20/2021   Hypertension    Migraine    "none since early /2017" (05/06/2016)   Obesity    OSA on CPAP    uses CPAP   Past Surgical History:  Procedure Laterality Date   ABDOMINAL HYSTERECTOMY     "partial"; both ovaries present   APPENDECTOMY  05/06/2016   CARDIAC CATHETERIZATION  ~ 2015   CARDIAC CATHETERIZATION  2015   In [place]   CHILECTOMY Right 06/01/2017   Procedure: CHILECTOMY RIGHT FOOT;  Surgeon: Edrick Kins, DPM;  Location: [facility];  Service: Podiatry;  Laterality: Right;   LAPAROSCOPIC APPENDECTOMY N/A 05/06/2016   Procedure: LAPAROSCOPIC APPENDECTOMY;  Surgeon: Donnie Mesa, MD;  Location: [facility];  Service: General;  Laterality: N/A;   TONSILLECTOMY     Patient Active Problem List   Diagnosis Date Noted   Hyperlipidemia 05/20/2021   Lumbar radiculopathy 12/24/2020   Trigger little finger of left hand 12/16/2020   Medial epicondylitis of elbow, left 12/16/2020   Memory difficulties 12/11/2020   Lipodermatosclerosis  of both lower extremities 04/30/2020   Globus sensation 11/15/2019   Laryngopharyngeal reflux (LPR) 11/15/2019   Throat clearing 10/05/2019   Gout 08/31/2019   Phlebitis 08/31/2019   Prediabetes 08/31/2019   OA (osteoarthritis) of knee 05/17/2019   Acute pain of right knee 05/17/2019   Abdominal pain 03/31/2019   Morbid obesity ([place]) 03/29/2018   GAD (generalized anxiety disorder) 02/27/2018   Mixed incontinence urge and stress 02/27/2018   OSA on CPAP 06/16/2017   Acute appendicitis 05/06/2016   Gastro-esophageal reflux disease with esophagitis 07/26/2011   Essential hypertension 07/26/2011   Osteoarthrosis 10/16/2009   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 No   OBJECTIVE:   TODAY'S TREATMENT:  12-16-21: Initiated training in compensations for attention and memory, including breaking tasks into small  parts, limiting distractions, setting a timer, keeping organized. She is having more success reading, however still has to limit reading to 20 minutes and re-read frequently. Instructed her to limit background noise and use an index card or book mark to aid attention. She endorses clutter due to physical limitations doing chores. Encouraged her to set a timer and work on reducing clutter 10 minutes at a time to support her attention and memory.Targeted word finding in complex naming task - [name] named 10/12 words with rare min A. Trained her in word finding activities to  do at home. This week she will use timer to work for 15 minutes 3x a day to reduce kitchen clutter.  She returned the Cognitive Function PROM with score of 104/140, most difficulty rated for recalling 4-5 errands, word finding, having to read several times, trouble concentrating, planning out steps of a task.  11-27-21: Educated patient on clinical observations and plan to initiate POC to address cognitive concerns and train compensatory strategies. Pt verbalized understanding and agreement with POC. Requested 1x/week due to high co-pay.      PATIENT EDUCATION: Education details: see above, See Patient Instruction\s Person educated: Patient Education method: Explanation and Demonstration Education comprehension: verbalized understanding, returned demonstration, and needs further education         GOALS: Goals reviewed with patient? Yes   SHORT TERM GOALS: Target date: 12/25/2021     Pt will verbalize and implement attention strategies to optimize household task completion and topic maintenance given occasional min A  Baseline: Goal status: ONGOING   2.  Pt will utilize word retrieval strategies in 10-15 minute conversation given occasional min A  Baseline:  Goal status: ONGOING   3.  Pt will verbalize and implement reading strategies to optimize comprehension and attention while reading multiple paragraphs given occasional min A  Baseline:  Goal status: ONGOING   4.  Pt will utilize pre-planning and/or external aids as necessary while driving to ensure safety and timely arrival given occasional min A Baseline:  Goal status: ONGOING     LONG TERM GOALS: Target date: 01/22/2022    Pt will verbalize and implement attention strategies to optimize household task completion and topic maintenance given rare min A  Baseline:  Goal status: ONGOING   2.  Pt will utilize word retrieval strategies in 30+ minute conversation given rare min A  Baseline:  Goal status: ONGOING   3.  Pt will  verbalize and carryover reading strategies to optimize comprehension and attention while reading a chapter a night > 1 week  Baseline:  Goal status: ONGOING   4.  Pt will report improved cognitive linguistic functioning via PROM by 5 points at last ST session  Baseline: CF 104 Goal status:ONGOING     ASSESSMENT:   CLINICAL IMPRESSION: Patient is a 70 y.o. female who was seen today for concussion after falling 10-27-21. Pt reports changes in attention, including difficulty multi-tasking (prior strength) resulting in confusion and getting lost within task, difficulty reading (previously read a chapter a night but has difficulty attending and comprehending now), searching for the words, and getting lost while driving. Pt exhibited anomia x2 in conversation (in which pt able to self-correct x1). Pt is reportedly managing bills, medications, and appointments by writing things down with success. Skilled ST intervention is warranted to optimize cognitive linguistic functioning to address aforementioned deficits and maximize return to baseline.    OBJECTIVE IMPAIRMENTS include attention, memory, and executive functioning. These impairments are limiting patient from household  responsibilities. Factors affecting potential to achieve goals and functional outcome are  N/A . Patient will benefit from skilled SLP services to address above impairments and improve overall function.   REHAB POTENTIAL: Good   PLAN: SLP FREQUENCY: 2x/week (pt elected for 1x/week for high co-pay)   SLP DURATION: 8 weeks   PLANNED INTERVENTIONS: Language facilitation, Environmental controls, Cueing hierachy, Cognitive reorganization, Internal/external aids, Functional tasks, Multimodal communication approach, SLP instruction and feedback, Compensatory strategies, and Patient/family education   Iline Oven, [place] 12/16/2021, 3:02 PM

## 2021-12-16 NOTE — Patient Instructions (Addendum)
  Consider using a book mark to help keep you on the right line so this is 1 less thing your brain has to do  Read when you are most alert if it is important  Have important conversations or meetings early in the morning when you are most alert  Set timer and work in kitchen 15 minutes a few times a day to start reducing clutter    Tips to help facilitate better attention, concentration, focus   Do harder, longer tasks when you are most alert/awake  Break down larger tasks into small parts  Limit distractions of TV, radio, conversation, e mails/texts, appliance noise, etc - if a job is important, do it in a quiet room  Be aware of how you are functioning in high stimulation environments such as large stores, parties, restaurants - any place with lots of lights, noise, signs etc  Group conversations may be more difficult to process than one on one conversations  Give yourself extra time to process conversation, reading materials, directions or information from your healthcare providers  Organization is key - clutters of laundry, mail, paperwork, dirty dishes - all make it more difficult to concentrate  Before you start a task, have all the needed supplies, directions, recipes ready and organized. This way you don't have to go looking for something in the middle of a task and become distracted.   Be aware of fatigue - take rests or breaks when needed to re-group and re-focus  Write out the alphabet, set a topic and write at least 1-2 words for each letter that are in the topic (ex: Christmas words, Famous people, Colors etc)  Write a long word then make small words out of it  Family Fued - no timers Scattergories Tabu [place] Scrabble Checkers Chess Connect [place] saw puzzles Easy cross words Memory match [place] a new game!  Listen to and discuss Ted Talks or Podcasts Read and discuss short articles of interest to you- Take  notes on these if memory is a challenge Discuss social media posts Look and discuss photo albums  The best activities to improve cognition are functional, real life activities that are important to you:  Plan a menu Participate in household chores and decisions (with supervision) Participate in [place] a party, trip or tailgate with all of the details (even if you aren't really going to carry it out) Participate in your hobby as you are able with assistance Manage your texts, emails with supervision if needed. Google search for items (even if you're not really going to buy anything) and compare prices and features Socialize -  however, too many visitors can be overwhelming, so set limits "My doctor said I should only visit (or talk) for 20 minutes" or "I do better when I visit with just 1-2 people at a time for 20 minutes"    It's good to use real in-person games, not just apps  Apps:  [employer] Elevate There are apps for most of the games listed above

## 2021-12-22 ENCOUNTER — Telehealth: Payer: Self-pay | Admitting: Family Medicine

## 2021-12-22 NOTE — Telephone Encounter (Signed)
Caller Name Corinthian Kemler Caller Phone Number [phone] Patient Name Christine [name] Patient DOB 09-Jan-1951 Call Type Message Only Information Provided Reason for Call Request for General Office Information Initial Comment Caller states she spoke with someone of front and she need to talk to someone about her leg. She has an appointment scheduled for Friday and her leg has been bothering her. Additional Comment Office hours provided. Disp. Time Disposition Final User 12/22/2021 1:15:18 PM General Information Provided Yes Hollice Gong Call Closed By: Hollice Gong Transaction Date/Time: 12/22/2021 1:09:35 PM (ET)

## 2021-12-22 NOTE — Telephone Encounter (Signed)
Patient is having pain in her left leg. She had a fall on 10/27/21 and had a concussion. She thinks this may be have happened at that time. Her leg has been hurting for 2 weeks. The pain is at the top of the leg by her groing/unable to lift if on her back doing leg lifts. The pain sometimes shots down to her knee and a tingle. She did have an appointment for tomorrow but moved it to Friday due to not having gas in her car. She thinks she is ok to wait until Friday. She is unable to come in tomorrow (Wednesday).  She is using heat when she can to help with the pain and OTC ibuprofen.  Will forward note to PCP to review. The patient states she will be ok to wait until Friday as has been hurting for 2 weeks already.

## 2021-12-22 NOTE — Telephone Encounter (Signed)
Pt made an appt via mychart for leg pain. She called because she stated she got a call from our office (reminder call) after talking with her ab sxs, she thinks this may be related to her fall 7/4. She ended up getting a concussion and now her leg is tingly and hurts to lift. Transferred to triage for nurse eval to see if she is okay to wait until Friday.

## 2021-12-23 ENCOUNTER — Encounter: Payer: Medicare HMO | Admitting: Speech Pathology

## 2021-12-23 ENCOUNTER — Ambulatory Visit: Payer: Medicare HMO | Admitting: Family Medicine

## 2021-12-25 ENCOUNTER — Encounter: Payer: Self-pay | Admitting: Family Medicine

## 2021-12-25 ENCOUNTER — Ambulatory Visit (INDEPENDENT_AMBULATORY_CARE_PROVIDER_SITE_OTHER): Payer: Medicare HMO | Admitting: Family Medicine

## 2021-12-25 VITALS — BP 120/80 | HR 70 | Temp 98.7°F | Ht 61.5 in | Wt 300.2 lb

## 2021-12-25 DIAGNOSIS — G4733 Obstructive sleep apnea (adult) (pediatric): Secondary | ICD-10-CM | POA: Diagnosis not present

## 2021-12-25 DIAGNOSIS — M76892 Other specified enthesopathies of left lower limb, excluding foot: Secondary | ICD-10-CM

## 2021-12-25 MED ORDER — ROSUVASTATIN CALCIUM 10 MG PO TABS: 10.0000 mg | ORAL_TABLET | Freq: Every day | ORAL | 3 refills | Status: DC

## 2021-12-25 MED ORDER — ESOMEPRAZOLE MAGNESIUM 40 MG PO CPDR: 40.0000 mg | DELAYED_RELEASE_CAPSULE | Freq: Every day | ORAL | 1 refills | Status: DC

## 2021-12-25 MED ORDER — DULOXETINE HCL 60 MG PO CPEP
60.0000 mg | ORAL_CAPSULE | Freq: Every day | ORAL | 1 refills | Status: DC
Start: 2021-12-25 — End: 2023-04-22

## 2021-12-25 NOTE — Patient Instructions (Addendum)
Heat (pad or rice pillow in microwave) over affected area, 10-15 minutes twice daily.   Ice/cold pack over area for 10-15 min twice daily.  OK to take Tylenol 1000 mg (2 extra strength tabs) or 975 mg (3 regular strength tabs) every 6 hours as needed.  If you are not turning the corner in the next few weeks, send me a message. PT is the next step.   Let us know if you need anything.  Quadriceps Strain Rehab It is normal to feel mild stretching, pulling, tightness, or discomfort as you do these exercises, but you should stop right away if you feel sudden pain or your pain gets worse. Stretching and range of motion exercises These exercises warm up your muscles and joints and improve the movement and flexibility of your thigh. These exercises can also help to relieve stiffness or swelling. Exercise A: Heel slides    Lie on your back with both knees straight. If this causes back discomfort, bend the knee of your healthy leg, placing your foot flat on the floor. Slowly slide your left / right heel back toward your buttocks until you feel a gentle stretch in the front of your knee or thigh. Hold for 30 seconds. Then slowly slide your heel back to the starting position. Repeat 2 times. Complete this exercise 3 times a week. Exercise B: Quadriceps stretch, prone    Lie on your abdomen on a firm surface, such as a bed or padded floor. Bend your left / right knee and hold your ankle. If you cannot reach your ankle or pant leg, loop a belt around your foot and grab the belt instead. Gently pull your heel toward your buttocks. Your knee should not slide out to the side. You should feel a stretch in the front of your thigh and knee. Hold this position for 30 seconds. Repeat 2 times. Complete this exercise 3 times a week. Strengthening exercises These exercises build strength and endurance in your thigh. Endurance is the ability to use your muscles for a long time, even after your muscles get  tired. Exercise C: Straight leg raises (quadriceps and hip flexors) Quality counts! Watch for signs that the quadriceps muscle is working to ensure that you are strengthening the correct muscles and not cheating by using healthier muscles. Lie on your back with your left / right leg extended and your other knee bent. Tense the muscles in the front of your left / right thigh. You should see your kneecap slide up or see increased dimpling just above the knee. Tighten these muscles even more and raise your leg 4-6 inches (10-15 cm) off the floor. Hold for 3 seconds. Keep the thigh muscles tense as you lower your leg. Relax the muscles slowly and completely after each repetition. Repeat 2 times. Complete this exercise 3 times a week. Exercise D: Straight leg raises (hip extensors) Lie on your belly on a bed or a firm surface with a pillow under your hips. Bend your left / right knee so your foot is straight up in the air. Tense your buttock muscles and lift your left / right thigh off the bed. Do not let your back arch. Hold this position for 3 seconds. Slowly return to the starting position. Let your muscles relax completely before doing another repetition. Repeat 2 times. Complete this exercise 3 times a week. Exercise E: Wall sits    Follow the directions for form closely. If you do not place your feet and knees properly,  this can lead to knee pain. Lean back against a smooth wall or door and walk your feet out 18-24 inches (46-61 cm) from it. Place your feet hip-width apart. Slowly slide down the wall or door until your knees bend  60-90 degrees. Keep your weight back and over your heels, not over your toes. Keep your thighs straight or pointing slightly outward. Hold for 1 second. Use your thigh and buttock muscles to push you back up to a standing position. Keep your weight through your heels while you do this. Rest for 5 seconds in between repetitions. Repeat 2 times. Complete this  exercise 3 times a week. Make sure you discuss any questions you have with your health care provider. Document Released: 04/12/2005 Document Revised: 12/18/2015 Document Reviewed: 01/14/2015 Elsevier Interactive Patient Education  2018 [employer].    Hip Exercises It is normal to feel mild stretching, pulling, tightness, or discomfort as you do these exercises, but you should stop right away if you feel sudden pain or your pain gets worse.   STRETCHING AND RANGE OF MOTION EXERCISES These exercises warm up your muscles and joints and improve the movement and flexibility of your hip. These exercises also help to relieve pain, numbness, and tingling. Exercise A: Hamstrings, Supine  Lie on your back. Loop a belt or towel over the ball of your left / right foot. The ball of your foot is on the walking surface, right under your toes. Straighten your left / right knee and slowly pull on the belt to raise your leg. Do not let your left / right knee bend while you do this. Keep your other leg flat on the floor. Raise the left / right leg until you feel a gentle stretch behind your left / right knee or thigh. Hold this position for 30 seconds. Slowly return your leg to the starting position. Repeat2 times. Complete this stretch 3 times per week. Exercise B: Hip Rotators  Lie on your back on a firm surface. Hold your left / right knee with your left / right hand. Hold your ankle with your other hand. Gently pull your left / right knee and rotate your lower leg toward your other shoulder. Pull until you feel a stretch in your buttocks. Keep your hips and shoulders firmly planted while you do this stretch. Hold this position for 30 seconds. Repeat 2 times. Complete this stretch 3 times per week. Exercise C: V-Sit (Hamstrings and Adductors)  Sit on the floor with your legs extended in a large "V" shape. Keep your knees straight during this exercise. Start with your head and chest upright, then  bend at your waist to reach for your left foot (position A). You should feel a stretch in your right inner thigh. Hold this position for 30 seconds. Then slowly return to the upright position. Bend at your waist to reach forward (position B). You should feel a stretch behind both of your thighs and knees. Hold this position for 30 seconds. Then slowly return to the upright position. Bend at your waist to reach for your right foot (position C). You should feel a stretch in your left inner thigh. Hold this position for 30 seconds. Then slowly return to the upright position. Repeat A, B, and C 2 times each. Complete this stretch 3 times per week. Exercise D: Lunge (Hip Flexors)  Place your left / right knee on the floor and bend your other knee so that is directly over your ankle. You should be  half-kneeling. Keep good posture with your head over your shoulders. Tighten your buttocks to point your tailbone downward. This helps your back to keep from arching too much. You should feel a gentle stretch in the front of your left / right thigh and hip. If you do not feel any resistance, slightly slide your other foot forward and then slowly lunge forward so your knee once again lines up over your ankle. Make sure your tailbone continues to point downward. Hold this position for 30 seconds. Repeat 2 times. Complete this stretch 3 times per week.  STRENGTHENING EXERCISES These exercises build strength and endurance in your hip. Endurance is the ability to use your muscles for a long time, even after they get tired. Exercise E: Bridge (Hip Extensors)  Lie on your back on a firm surface with your knees bent and your feet flat on the floor. Tighten your buttocks muscles and lift your bottom off the floor until the trunk of your body is level with your thighs. Do not arch your back. You should feel the muscles working in your buttocks and the back of your thighs. If you do not feel these muscles, slide  your feet 1-2 inches (2.5-5 cm) farther away from your buttocks. Hold this position for 3 seconds. Slowly lower your hips to the starting position. Repeat for a total of 10 repetitions. Let your muscles relax completely between repetitions. If this exercise is too easy, try doing it with your arms crossed over your chest. Repeat 2 times. Complete this exercise 3 times per week. Exercise F: Straight Leg Raises - Hip Abductors  Lie on your side with your left / right leg in the top position. Lie so your head, shoulder, knee, and hip line up with each other. You may bend your bottom knee to help you balance. Roll your hips slightly forward, so your hips are stacked directly over each other and your left / right knee is facing forward. Leading with your heel, lift your top leg 4-6 inches (10-15 cm). You should feel the muscles in your outer hip lifting. Do not let your foot drift forward. Do not let your knee roll toward the ceiling. Hold this position for 1 second. Slowly return to the starting position. Let your muscles relax completely between repetitions. Repeat for a total of 10 repetitions.  Repeat 2 times. Complete this exercise 3 times per week. Exercise G: Straight Leg Raises - Hip Adductors  Lie on your side with your left / right leg in the bottom position. Lie so your head, shoulder, knee, and hip line up. You may place your upper foot in front to help you balance. Roll your hips slightly forward, so your hips are stacked directly over each other and your left / right knee is facing forward. Tense the muscles in your inner thigh and lift your bottom leg 4-6 inches (10-15 cm). Hold this position for 1 second. Slowly return to the starting position. Let your muscles relax completely between repetitions. Repeat for a total of 10 repetitions. Repeat 2 times. Complete this exercise 3 times per week. Exercise H: Straight Leg Raises - Quadriceps  Lie on your back with your left / right  leg extended and your other knee bent. Tense the muscles in the front of your left / right thigh. When you do this, you should see your kneecap slide up or see increased dimpling just above your knee. Tighten these muscles even more and raise your leg 4-6 inches (10-15 cm)  off the floor. Hold this position for 3 seconds. Keep these muscles tense as you lower your leg. Relax the muscles slowly and completely between repetitions. Repeat for a total of 10 repetitions. Repeat 2 times. Complete this exercise 3 times per week. Exercise I: Hip Abductors, Standing Tie one end of a rubber exercise band or tubing to a secure surface, such as a table or pole. Loop the other end of the band or tubing around your left / right ankle. Keeping your ankle with the band or tubing directly opposite of the secured end, step away until there is tension in the tubing or band. Hold onto a chair as needed for balance. Lift your left / right leg out to your side. While you do this: Keep your back upright. Keep your shoulders over your hips. Keep your toes pointing forward. Make sure to use your hip muscles to lift your leg. Do not "throw" your leg or tip your body to lift your leg. Hold this position for 1 second. Slowly return to the starting position. Repeat for a total of 10 repetitions. Repeat 2 times. Complete this exercise 3 times per week. Exercise J: Squats (Quadriceps) Stand in a door frame so your feet and knees are in line with the frame. You may place your hands on the frame for balance. Slowly bend your knees and lower your hips like you are going to sit in a chair. Keep your lower legs in a straight-up-and-down position. Do not let your hips go lower than your knees. Do not bend your knees lower than told by your health care provider. If your hip pain increases, do not bend as low. Hold this position for 1 second. Slowly push with your legs to return to standing. Do not use your hands to pull  yourself to standing. Repeat for a total of 10 repetitions. Repeat 2 times. Complete this exercise 3 times per week. Make sure you discuss any questions you have with your health care provider. Document Released: 04/30/2005 Document Revised: 01/05/2016 Document Reviewed: 04/07/2015 Elsevier Interactive Patient Education  [employer].

## 2021-12-25 NOTE — Progress Notes (Signed)
Musculoskeletal Exam  Patient: Christine [name] DOB: 1950-09-28  DOS: 12/25/2021  SUBJECTIVE:  Chief Complaint:   Chief Complaint  Patient presents with   Leg Pain    Left     Christine [name] is a 71 y.o.  female for evaluation and treatment of LLE pain.   Onset:  2 months ago. Started after a fall off a chair Location: L hip flexor down thru quad Character:  burning, sharp, and throbbing  Progression of issue:  has worsened Associated symptoms: weakness 2/2 pain No bruising, swelling, redness, fevers, abd pain, N/V/D Treatment: to date has been OTC NSAIDS and heat.   Neurovascular symptoms: no  Past Medical History:  Diagnosis Date   Allergy    dust, pollen, sulfa, prednisone   Anemia    Anxiety    Arthritis    "knees" (04/26/2016)   Colon polyps    benign per pt   Coronary artery disease    mild per 2015 cath in [place] (OM1 30%, RCA 30%)   GERD (gastroesophageal reflux disease)    Gout    History of hiatal hernia    Hyperlipidemia 05/20/2021   Hypertension    Migraine    "none since early /2017" (05/06/2016)   Obesity    OSA on CPAP    uses CPAP    Objective: VITAL SIGNS: BP 120/80   Pulse 70   Temp 98.7 F (37.1 C) (Oral)   Ht 5' 1.5" (1.562 m)   Wt (!) 300 lb 4 oz (136.2 kg)   SpO2 96%   BMI 55.81 kg/m  Constitutional: Well formed, well developed. No acute distress. Thorax & Lungs: No accessory muscle use Musculoskeletal: L hip.   Tenderness to palpation: yes over hip flexor at inguinal region Deformity: no Ecchymosis: no Tests positive: Stinchfield Tests negative: logroll, FABER, FADIR Neurologic: Normal sensory function. Gait antalgic. Psychiatric: Normal mood. Age appropriate judgment and insight. Alert & oriented x 3.    Assessment:  Hip flexor tendinitis, left  Plan: Stretches/exercises, heat, ice, Tylenol. PT if no improvement in 3-4 weeks.  F/u as originally scheduled. The patient voiced understanding and agreement to the  plan.   [place], DO 12/25/21  2:26 PM

## 2021-12-29 ENCOUNTER — Encounter: Payer: Self-pay | Admitting: Family Medicine

## 2021-12-29 ENCOUNTER — Telehealth: Payer: Self-pay | Admitting: Family Medicine

## 2021-12-29 ENCOUNTER — Ambulatory Visit: Payer: Medicare HMO | Attending: Family Medicine | Admitting: Speech Pathology

## 2021-12-29 DIAGNOSIS — R41841 Cognitive communication deficit: Secondary | ICD-10-CM | POA: Insufficient documentation

## 2021-12-29 DIAGNOSIS — F801 Expressive language disorder: Secondary | ICD-10-CM | POA: Diagnosis present

## 2021-12-29 DIAGNOSIS — R69 Illness, unspecified: Secondary | ICD-10-CM | POA: Diagnosis not present

## 2021-12-29 NOTE — Therapy (Signed)
OUTPATIENT SPEECH LANGUAGE PATHOLOGY TREATMENT NOTE   Patient Name: Christine [name] MRN: [id number] DOB:1950-08-22, 71 y.o., female Today's Date: 12/29/2021  PCP: Shelda Pal, DO  REFERRING PROVIDER: Gregor Hams, MD    END OF SESSION:   End of Session - 12/29/21 1457     Visit Number 3    Number of Visits 17    Date for SLP Re-Evaluation 01/22/22    Authorization Type [employer]    Authorization Time Period $35 copay    SLP Start Time 1457   pt arrived late to session   SLP Stop Time  1530    SLP Time Calculation (min) 33 min    Activity Tolerance Patient tolerated treatment well             Past Medical History:  Diagnosis Date   Allergy    dust, pollen, sulfa, prednisone   Anemia    Anxiety    Arthritis    "knees" (04/26/2016)   Colon polyps    benign per pt   Coronary artery disease    mild per 2015 cath in [place] (OM1 30%, RCA 30%)   GERD (gastroesophageal reflux disease)    Gout    History of hiatal hernia    Hyperlipidemia 05/20/2021   Hypertension    Migraine    "none since early /2017" (05/06/2016)   Obesity    OSA on CPAP    uses CPAP   Past Surgical History:  Procedure Laterality Date   ABDOMINAL HYSTERECTOMY     "partial"; both ovaries present   APPENDECTOMY  05/06/2016   CARDIAC CATHETERIZATION  ~ 2015   CARDIAC CATHETERIZATION  2015   In [place]   CHILECTOMY Right 06/01/2017   Procedure: CHILECTOMY RIGHT FOOT;  Surgeon: Edrick Kins, DPM;  Location: [facility];  Service: Podiatry;  Laterality: Right;   LAPAROSCOPIC APPENDECTOMY N/A 05/06/2016   Procedure: LAPAROSCOPIC APPENDECTOMY;  Surgeon: Donnie Mesa, MD;  Location: [facility];  Service: General;  Laterality: N/A;   TONSILLECTOMY     Patient Active Problem List   Diagnosis Date Noted   Hyperlipidemia 05/20/2021   Lumbar radiculopathy 12/24/2020   Trigger little finger of left hand 12/16/2020   Medial epicondylitis of elbow, left 12/16/2020   Memory difficulties  12/11/2020   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   Globus sensation 11/15/2019   Laryngopharyngeal reflux (LPR) 11/15/2019   Throat clearing 10/05/2019   Gout 08/31/2019   Phlebitis 08/31/2019   Prediabetes 08/31/2019   OA (osteoarthritis) of knee 05/17/2019   Acute pain of right knee 05/17/2019   Abdominal pain 03/31/2019   Morbid obesity ([place]) 03/29/2018   GAD (generalized anxiety disorder) 02/27/2018   Mixed incontinence urge and stress 02/27/2018   OSA on CPAP 06/16/2017   Acute appendicitis 05/06/2016   Gastro-esophageal reflux disease with esophagitis 07/26/2011   Essential hypertension 07/26/2011   Osteoarthrosis 10/16/2009   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 No "dull ache" in R knee  SPEECH THERAPY DISCHARGE SUMMARY  Visits from Start of Care: 3  Current functional level related to goals / functional outcomes: Pt reports improved cognitive functioning, reports return to baseline thinking skills.    Remaining deficits: None reported    Education / Equipment: Attention/processing/memory strategies and compensations, word finding strategies, HEP, brain health   Patient agrees to discharge. Patient goals were partially met. Patient is being discharged due to being pleased with the current functional level.    OBJECTIVE:   TODAY'S TREATMENT:  12-29-21: Pt reports things are going "really well." Each week pt is seeing improvement in her  "thinking." Able to recall strategy of breaking task into manageable chunks. Gives example of beginning day of setting goal of what she wanted to accomplish, today's being cleaning up glass containers and taking to recycling. No overt word finding demonstrated during today's session. Pt IDs only remaining challenge as getting gastric sleeve surgery and being overwhelmed by the process. SLP provided education on strategies to reading comprehension and processing of information presented from doctors. During role play, pt able to demonstrate repeat back, asking questions to confirm understanding, and ask for information in writing supervision-A   12-16-21: Initiated training in compensations for attention and memory, including breaking tasks into small parts, limiting distractions, setting a timer, keeping organized. She is having more success reading, however still has to limit reading to 20 minutes and re-read frequently. Instructed her to limit background noise and use an index card or book mark to aid attention. She endorses clutter due to physical limitations doing chores. Encouraged her to set a timer and work on reducing clutter 10 minutes at a time to support her attention and memory.Targeted word finding in complex naming task - [name] named 10/12 words with rare min A. Trained her in word finding activities to do at home. This week she will use timer to work for 15 minutes 3x a day to reduce kitchen clutter.  She returned the Cognitive Function PROM with score of 104/140, most difficulty rated for recalling 4-5 errands, word finding, having to read several times, trouble concentrating, planning out steps of a task.  11-27-21: Educated patient on clinical observations and plan to initiate POC to address cognitive concerns and train compensatory strategies. Pt verbalized understanding and agreement with POC. Requested 1x/week due to high co-pay.      PATIENT EDUCATION: Education details: see above, See  Patient Instruction\s Person educated: Patient Education method: Explanation and Demonstration Education comprehension: verbalized understanding, returned demonstration, and needs further education         GOALS: Goals reviewed with patient? Yes   SHORT TERM GOALS: Target date: 12/25/2021     Pt will verbalize and implement attention strategies to optimize household task completion and topic maintenance given occasional min A  Baseline: 12/29/2021 Goal status: MET   2.  Pt will utilize word retrieval strategies in 10-15 minute conversation given occasional min A  Baseline: 12/29/2021 Goal status: MET   3.  Pt will verbalize and implement reading strategies to optimize comprehension and attention while reading multiple paragraphs given occasional min A  Baseline:  Goal status: NOT MET   4.  Pt will utilize pre-planning and/or external aids as necessary while driving to ensure safety and timely arrival given occasional min A Baseline:  Goal status: NOT MET      LONG TERM GOALS: Target date: 01/22/2022    Pt will verbalize and implement attention strategies to optimize household task completion and topic maintenance given rare min A  Baseline:  Goal status: NOT MET   2.  Pt will utilize word retrieval strategies in 30+ minute conversation given rare min A  Baseline:  Goal status: NOT MET   3.  Pt will verbalize and carryover reading strategies to optimize comprehension and attention while reading a chapter a night > 1 week  Baseline: 12-29-2021 Goal status: MET   4.  Pt will report improved cognitive linguistic functioning via PROM by 5 points at last ST session  Baseline: CF 104 Goal status: NOT MET     ASSESSMENT:   CLINICAL IMPRESSION: Christine [name] was seen by ST for cognitive changes s/p concussion. Since initiation of ST, focusing on strategies and compensations to aid in improved daily functioning, pt reporting feeling as though she is back to baseline and no longer requires  ST. Pt d/c this date at her request, SLP advises to seek new referral should needs change.     OBJECTIVE IMPAIRMENTS include attention, memory, and executive functioning. These impairments are limiting patient from household responsibilities. Factors affecting potential to achieve goals and functional outcome are  N/A . Patient will benefit from skilled SLP services to address above impairments and improve overall function.   REHAB POTENTIAL: Good   PLAN: SLP FREQUENCY: 2x/week (pt elected for 1x/week for high co-pay)   SLP DURATION: 8 weeks   PLANNED INTERVENTIONS: Language facilitation, Environmental controls, Cueing hierachy, Cognitive reorganization, Internal/external aids, Functional tasks, Multimodal communication approach, SLP instruction and feedback, Compensatory strategies, and Patient/family education   Su Monks, CCC-SLP 12/29/2021, 2:58 PM

## 2021-12-29 NOTE — Telephone Encounter (Signed)
Completed form for [facility] weight loss/included a letter of medical necessity. Faxed to [facility].

## 2021-12-30 ENCOUNTER — Ambulatory Visit: Payer: Medicare HMO | Admitting: Family Medicine

## 2021-12-31 DIAGNOSIS — K219 Gastro-esophageal reflux disease without esophagitis: Secondary | ICD-10-CM | POA: Diagnosis not present

## 2021-12-31 DIAGNOSIS — Z01818 Encounter for other preprocedural examination: Secondary | ICD-10-CM | POA: Diagnosis not present

## 2021-12-31 DIAGNOSIS — E785 Hyperlipidemia, unspecified: Secondary | ICD-10-CM | POA: Diagnosis not present

## 2021-12-31 DIAGNOSIS — Z6841 Body Mass Index (BMI) 40.0 and over, adult: Secondary | ICD-10-CM | POA: Diagnosis not present

## 2021-12-31 DIAGNOSIS — G4733 Obstructive sleep apnea (adult) (pediatric): Secondary | ICD-10-CM | POA: Diagnosis not present

## 2021-12-31 DIAGNOSIS — I10 Essential (primary) hypertension: Secondary | ICD-10-CM | POA: Diagnosis not present

## 2022-01-01 ENCOUNTER — Other Ambulatory Visit (HOSPITAL_COMMUNITY): Payer: Self-pay | Admitting: Surgery

## 2022-01-01 ENCOUNTER — Other Ambulatory Visit: Payer: Self-pay | Admitting: Surgery

## 2022-01-01 DIAGNOSIS — Z01818 Encounter for other preprocedural examination: Secondary | ICD-10-CM

## 2022-01-04 ENCOUNTER — Encounter: Payer: Medicare HMO | Admitting: Speech Pathology

## 2022-01-11 ENCOUNTER — Ambulatory Visit: Payer: Medicare HMO

## 2022-01-11 ENCOUNTER — Ambulatory Visit (INDEPENDENT_AMBULATORY_CARE_PROVIDER_SITE_OTHER): Payer: Medicare HMO | Admitting: *Deleted

## 2022-01-11 DIAGNOSIS — Z0000 Encounter for general adult medical examination without abnormal findings: Secondary | ICD-10-CM | POA: Diagnosis not present

## 2022-01-11 NOTE — Progress Notes (Signed)
Subjective:   Christine [name] is a 71 y.o. female who presents for Medicare Annual (Subsequent) preventive examination.  I connected with  Charlann Noss on 01/11/22 by a audio enabled telemedicine application and verified that I am speaking with the correct person using two identifiers.  Patient Location: Home  Provider Location: Office/Clinic  I discussed the limitations of evaluation and management by telemedicine. The patient expressed understanding and agreed to proceed.  Review of Systems    Defer to PCP Cardiac Risk Factors include: dyslipidemia;hypertension;advanced age (>91mn, >>57women)     Objective:    There were no vitals filed for this visit. There is no height or weight on file to calculate BMI.     01/11/2022    1:45 PM 11/27/2021    2:09 PM 01/10/2021   11:27 AM 12/25/2020    8:18 PM 12/11/2020   10:05 AM 07/09/2020    4:41 PM 07/26/2017   10:03 AM  Advanced Directives  Does Patient Have a Medical Advance Directive? No No No No No No No  Would patient like information on creating a medical advance directive? No - Patient declined Yes (MAU/Ambulatory/Procedural Areas - Information given) No - Patient declined No - Patient declined  Yes (Inpatient - patient defers creating a medical advance directive at this time - Information given) No - Patient declined    Current Medications (verified) Outpatient Encounter Medications as of 01/11/2022  Medication Sig   aspirin 81 MG EC tablet Take by mouth.   DULoxetine (CYMBALTA) 60 MG capsule Take 1 capsule (60 mg total) by mouth daily.   esomeprazole (NEXIUM) 40 MG capsule Take 1 capsule (40 mg total) by mouth daily.   ibuprofen (ADVIL) 800 MG tablet Take 1 tablet (800 mg total) by mouth every 8 (eight) hours as needed.   metoprolol tartrate (LOPRESSOR) 50 MG tablet Take 1 tablet (50 mg total) by mouth daily.   oxybutynin (DITROPAN XL) 15 MG 24 hr tablet TAKE ONE TABLET BY MOUTH EVERY NIGHT AT BEDTIME   rosuvastatin (CRESTOR)  10 MG tablet Take 1 tablet (10 mg total) by mouth daily.   triamterene-hydrochlorothiazide (MAXZIDE) 75-50 MG tablet TAKE 1 TABLET BY MOUTH EVERY DAY   No facility-administered encounter medications on file as of 01/11/2022.    Allergies (verified) Prednisone, Tape, and Sulfa antibiotics   History: Past Medical History:  Diagnosis Date   Allergy    dust, pollen, sulfa, prednisone   Anemia    Anxiety    Arthritis    "knees" (04/26/2016)   Colon polyps    benign per pt   Coronary artery disease    [place]OM[place] 30%, RCA 30%)   GERD (gastroesophageal reflux disease)    Gout    History of hiatal hernia    Hyperlipidemia 05/20/2021   Hypertension    Migraine    "none since early /2017" (05/06/2016)   Obesity    OSA on CPAP    uses CPAP   Past Surgical History:  Procedure Laterality Date   ABDOMINAL HYSTERECTOMY     "partial"; both ovaries present   APPENDECTOMY  05/06/2016   CARDIAC CATHETERIZATION  ~ 2015   [place]   Procedure: CLester  Surgeon: EEdrick Kins DPM;  [facility]  Service: Podiatry;  Laterality: Right;   LAPAROSCOPIC APPENDECTOMY N/A 05/06/2016   Procedure: LAPAROSCOPIC APPENDECTOMY;  Surgeon: MDonnie Mesa MD;  [facility];  Service: General;  Laterality: N/A;   TONSILLECTOMY     Family History  Problem Relation Age of Onset   Diabetes Mother    Heart disease Mother    High blood pressure Mother    Asthma Mother    Arthritis Mother    Diabetes Father    Heart disease Father    High blood pressure Father    Asthma Father    Diabetes Sister    Alcohol abuse Brother    Drug abuse Brother    Arthritis Sister    Diabetes Sister    Varicose Veins Sister    Colon cancer Neg Hx    Esophageal cancer Neg Hx    Rectal cancer Neg Hx    Social History   Socioeconomic History   Marital status: Single    Spouse name: Not on file   Number of  children: 0   Years of education: Not on file   Highest education level: Not on file  Occupational History   Occupation: retired  Tobacco Use   Smoking status: Former    Packs/day: 0.12    Years: 5.00    Total pack years: 0.60    Types: Cigarettes    Quit date: 04/26/1978    Years since quitting: 43.7   Smokeless tobacco: Never  Vaping Use   Vaping Use: Never used  Substance and Sexual Activity   Alcohol use: No   Drug use: No   Sexual activity: Not Currently    Birth control/protection: None  Other Topics Concern   Not on file  Social History Narrative   Right handed    Lives alone with dog    Social Determinants of Health   Financial Resource Strain: Low Risk  (01/10/2021)   Overall Financial Resource Strain (CARDIA)    Difficulty of Paying Living Expenses: Not hard at all  Food Insecurity: No Food Insecurity (01/10/2021)   Hunger Vital Sign    Worried About Running Out of Food in the Last Year: Never true    Ran Out of Food in the Last Year: Never true  Transportation Needs: No Transportation Needs (01/10/2021)   PRAPARE - Hydrologist (Medical): No    Lack of Transportation (Non-Medical): No  Physical Activity: Insufficiently Active (01/10/2021)   Exercise Vital Sign    Days of Exercise per Week: 3 days    Minutes of Exercise per Session: 30 min  Stress: No Stress Concern Present (01/10/2021)   [place]    Feeling of Stress : Not at all  Social Connections: Socially Isolated (01/10/2021)   Social Connection and Isolation Panel [NHANES]    Frequency of Communication with Friends and Family: More than three times a week    Frequency of Social Gatherings with Friends and Family: More than three times a week    Attends Religious Services: Never    Marine scientist or Organizations: No    Attends Music therapist: Never    Marital Status: Never married     Tobacco Counseling Counseling given: Not Answered   Clinical Intake:  Pre-visit preparation completed: Yes  Pain : No/denies pain     Diabetes: No  How often do you need to have someone help you when you read instructions, pamphlets, or other written materials from your doctor or pharmacy?: 1 - Never  Diabetic? No   Activities of Daily Living    01/11/2022  1:47 PM  In your present state of health, do you have any difficulty performing the following activities:  Hearing? 1  Comment needs hearing aids but hasn't gotten them yet  Vision? 0  Difficulty concentrating or making decisions? 0  Walking or climbing stairs? 1  Dressing or bathing? 0  Doing errands, shopping? 0  Preparing Food and eating ? N  Using the Toilet? N  In the past six months, have you accidently leaked urine? Y  Do you have problems with loss of bowel control? N  Managing your Medications? N  Managing your Finances? N  Housekeeping or managing your Housekeeping? N    Patient Care Team: Shelda Pal, DO as PCP - General (Family Medicine) Elease Hashimoto (Neurology) Chesley Mires, MD as Consulting Physician (Pulmonary Disease) Elam Dutch, MD (Inactive) (Vascular Surgery) Harriett Sine, MD as Consulting Physician (Dermatology) Edrick Kins, DPM as Consulting Physician (Podiatry)  Indicate any recent Medical Services you may have received from other than [facility] providers in the past year (date may be approximate).     Assessment:   This is a routine wellness examination for Dariela.  Hearing/Vision screen No results found.  Dietary issues and exercise activities discussed: Current Exercise Habits: The patient does not participate in regular exercise at present, Type of exercise: stretching, Time (Minutes): 30, Frequency (Times/Week): 7, Weekly Exercise (Minutes/Week): 210, Intensity: Mild, Exercise limited by: None identified   Goals Addressed   None     Depression Screen    01/11/2022    1:47 PM 11/19/2021    9:41 AM 01/10/2021   11:21 AM 08/29/2020    1:23 PM  PHQ 2/9 Scores  PHQ - 2 Score 0 2 0 0  PHQ- 9 Score  3      Fall Risk    01/11/2022    1:46 PM 11/19/2021    9:40 AM 05/20/2021    9:24 AM 01/10/2021   11:20 AM 12/11/2020   10:05 AM  Fall Risk   Falls in the past year? 1 1 0 0 1  Number falls in past yr: 0 0 0 0 1  Injury with Fall? 1 1 0 0 0  Comment  had a concussion     Risk for fall due to : History of fall(s) No Fall Risks No Fall Risks Orthopedic patient   Follow up Falls evaluation completed Falls evaluation completed Falls evaluation completed Falls prevention discussed     [place]:  Any stairs in or around the home? Yes  If so, are there any without handrails? No  Home free of loose throw rugs in walkways, pet beds, electrical cords, etc? Yes  Adequate lighting in your home to reduce risk of falls? Yes   ASSISTIVE DEVICES UTILIZED TO PREVENT FALLS:  Life alert? No  Use of a cane, walker or w/c? Yes  Grab bars in the bathroom? No  Shower chair or bench in shower? No  Elevated toilet seat or a handicapped toilet? No    Cognitive Function:      12/11/2020   11:00 AM  Montreal Cognitive Assessment   Visuospatial/ Executive (0/5) 5  Naming (0/3) 2  Attention: Read list of digits (0/2) 2  Attention: Read list of letters (0/1) 1  Attention: Serial 7 subtraction starting at 100 (0/3) 3  Language: Repeat phrase (0/2) 2  Language : Fluency (0/1) 1  Abstraction (0/2) 2  Delayed Recall (0/5) 3  Orientation (0/6) 6  Total 27  Adjusted Score (based on education) 28      01/11/2022    1:55 PM  6CIT Screen  What Year? 0 points  What month? 0 points  What time? 0 points  Count back from 20 0 points  Months in reverse 0 points  Repeat phrase 0 points  Total Score 0 points    Immunizations Immunization History  Administered Date(s) Administered   Fluad Quad(high  Dose 65+) 01/28/2020   Influenza, High Dose Seasonal PF 12/29/2018, 02/23/2021   Influenza,inj,Quad PF,6+ Mos 02/27/2018   Influenza-Unspecified 02/14/2017, 02/02/2019   PFIZER(Purple Top)SARS-COV-2 Vaccination 05/20/2019, 06/11/2019, 02/06/2020, 09/16/2020, 02/23/2021   PNEUMOCOCCAL CONJUGATE-20 07/19/2020   Pneumococcal Polysaccharide-23 12/26/2014, 09/27/2018   Tdap 08/06/2016   Zoster Recombinat (Shingrix) 12/10/2020, 02/11/2021    TDAP status: Up to date  Flu Vaccine status: Due, Education has been provided regarding the importance of this vaccine. Advised may receive this vaccine at local pharmacy or Health Dept. Aware to provide a copy of the vaccination record if obtained from local pharmacy or Health Dept. Verbalized acceptance and understanding.  Pneumococcal vaccine status: Up to date  Covid-19 vaccine status: Information provided on how to obtain vaccines.   Qualifies for Shingles Vaccine? Yes   Zostavax completed No   Shingrix Completed?: Yes  Screening Tests Health Maintenance  Topic Date Due   INFLUENZA VACCINE  11/24/2021   COVID-19 Vaccine (6 - Pfizer series) 05/22/2022 (Originally 04/20/2021)   COLONOSCOPY (Pts 45-31yr Insurance coverage will need to be confirmed)  09/25/2022   MAMMOGRAM  04/08/2023   TETANUS/TDAP  08/07/2026   Pneumonia Vaccine 69 Years old  Completed   DEXA SCAN  Completed   Hepatitis C Screening  Completed   Zoster Vaccines- Shingrix  Completed   HPV VACCINES  Aged Out    Health Maintenance  Health Maintenance Due  Topic Date Due   INFLUENZA VACCINE  11/24/2021    Colorectal cancer screening: Type of screening: Colonoscopy. Completed 09/24/12. Repeat every 10 years  Mammogram status: Completed 04/07/21. Repeat every year  Bone Density status: Completed 10/06/18. Results reflect: Bone density results: NORMAL. Repeat every 2 years.  Lung Cancer Screening: (Low Dose CT Chest recommended if Age 69-80years, 30 pack-year currently  smoking OR have quit w/in 15years.) does not qualify.   Lung Cancer Screening Referral: N/a  Additional Screening:  Hepatitis C Screening: does qualify; Completed 09/27/18  Vision Screening: Recommended annual ophthalmology exams for early detection of glaucoma and other disorders of the eye. Is the patient up to date with their annual eye exam?  No  Who is the provider or what is the name of the office in which the patient attends annual eye exams? Doesn't remember the name If pt is not established with a provider, would they like to be referred to a provider to establish care? No .   Dental Screening: Recommended annual dental exams for proper oral hygiene  Community Resource Referral / Chronic Care Management: CRR required this visit?  No   CCM required this visit?  No      Plan:     I have personally reviewed and noted the following in the patient's chart:   Medical and social history Use of alcohol, tobacco or illicit drugs  Current medications and supplements including opioid prescriptions. Patient is not currently taking opioid prescriptions. Functional ability and status Nutritional status Physical activity Advanced directives List of other physicians Hospitalizations, surgeries, and ER visits in previous 12 months Vitals Screenings to include cognitive,  depression, and falls Referrals and appointments  In addition, I have reviewed and discussed with patient certain preventive protocols, quality metrics, and best practice recommendations. A written personalized care plan for preventive services as well as general preventive health recommendations were provided to patient.   Due to this being a telephonic visit, the after visit summary with patients personalized plan was offered to patient via mail or my-chart. Patient would like to access on my-chart.   Beatris Ship, [place]   01/11/2022   Nurse Notes: None

## 2022-01-11 NOTE — Patient Instructions (Signed)
Christine [name] , Thank you for taking time to come for your Medicare Wellness Visit. I appreciate your ongoing commitment to your health goals. Please review the following plan we discussed and let me know if I can assist you in the future.   These are the goals we discussed:  Goals      Patient Stated     She is seeing a therapist - working on being more social        This is a list of the screening recommended for you and due dates:  Health Maintenance  Topic Date Due   Flu Shot  11/24/2021   COVID-19 Vaccine (6 - Pfizer series) 05/22/2022*   Colon Cancer Screening  09/25/2022   Mammogram  04/08/2023   Tetanus Vaccine  08/07/2026   Pneumonia Vaccine  Completed   DEXA scan (bone density measurement)  Completed   Hepatitis C Screening: USPSTF Recommendation to screen - Ages 106-79 yo.  Completed   Zoster (Shingles) Vaccine  Completed   HPV Vaccine  Aged Out  *Topic was postponed. The date shown is not the original due date.      Next appointment: Follow up in one year for your annual wellness visit    Preventive Care 65 Years and Older, Female Preventive care refers to lifestyle choices and visits with your health care provider that can promote health and wellness. What does preventive care include? A yearly physical exam. This is also called an annual well check. Dental exams once or twice a year. Routine eye exams. Ask your health care provider how often you should have your eyes checked. Personal lifestyle choices, including: Daily care of your teeth and gums. Regular physical activity. Eating a healthy diet. Avoiding tobacco and drug use. Limiting alcohol use. Practicing safe sex. Taking low-dose aspirin every day. Taking vitamin and mineral supplements as recommended by your health care provider. What happens during an annual well check? The services and screenings done by your health care provider during your annual well check will depend on your age, overall  health, lifestyle risk factors, and family history of disease. Counseling  Your health care provider may ask you questions about your: Alcohol use. Tobacco use. Drug use. Emotional well-being. Home and relationship well-being. Sexual activity. Eating habits. History of falls. Memory and ability to understand (cognition). Work and work Statistician. Reproductive health. Screening  You may have the following tests or measurements: Height, weight, and BMI. Blood pressure. Lipid and cholesterol levels. These may be checked every 5 years, or more frequently if you are over 23 years old. Skin check. Lung cancer screening. You may have this screening every year starting at age 3 if you have a 30-pack-year history of smoking and currently smoke or have quit within the past 15 years. Fecal occult blood test (FOBT) of the stool. You may have this test every year starting at age 66. Flexible sigmoidoscopy or colonoscopy. You may have a sigmoidoscopy every 5 years or a colonoscopy every 10 years starting at age 19. Hepatitis C blood test. Hepatitis B blood test. Sexually transmitted disease (STD) testing. Diabetes screening. This is done by checking your blood sugar (glucose) after you have not eaten for a while (fasting). You may have this done every 1-3 years. Bone density scan. This is done to screen for osteoporosis. You may have this done starting at age 20. Mammogram. This may be done every 1-2 years. Talk to your health care provider about how often you should have regular mammograms.  Talk with your health care provider about your test results, treatment options, and if necessary, the need for more tests. Vaccines  Your health care provider may recommend certain vaccines, such as: Influenza vaccine. This is recommended every year. Tetanus, diphtheria, and acellular pertussis (Tdap, Td) vaccine. You may need a Td booster every 10 years. Zoster vaccine. You may need this after age  33. Pneumococcal 13-valent conjugate (PCV13) vaccine. One dose is recommended after age 41. Pneumococcal polysaccharide (PPSV23) vaccine. One dose is recommended after age 65. Talk to your health care provider about which screenings and vaccines you need and how often you need them. This information is not intended to replace advice given to you by your health care provider. Make sure you discuss any questions you have with your health care provider. Document Released: 05/09/2015 Document Revised: 12/31/2015 Document Reviewed: 02/11/2015 Elsevier Interactive Patient Education  2017 [place] Prevention in the Home Falls can cause injuries. They can happen to people of all ages. There are many things you can do to make your home safe and to help prevent falls. What can I do on the outside of my home? Regularly fix the edges of walkways and driveways and fix any cracks. Remove anything that might make you trip as you walk through a door, such as a raised step or threshold. Trim any bushes or trees on the path to your home. Use bright outdoor lighting. Clear any walking paths of anything that might make someone trip, such as rocks or tools. Regularly check to see if handrails are loose or broken. Make sure that both sides of any steps have handrails. Any raised decks and porches should have guardrails on the edges. Have any leaves, snow, or ice cleared regularly. Use sand or salt on walking paths during winter. Clean up any spills in your garage right away. This includes oil or grease spills. What can I do in the bathroom? Use night lights. Install grab bars by the toilet and in the tub and shower. Do not use towel bars as grab bars. Use non-skid mats or decals in the tub or shower. If you need to sit down in the shower, use a plastic, non-slip stool. Keep the floor dry. Clean up any water that spills on the floor as soon as it happens. Remove soap buildup in the tub or shower  regularly. Attach bath mats securely with double-sided non-slip rug tape. Do not have throw rugs and other things on the floor that can make you trip. What can I do in the bedroom? Use night lights. Make sure that you have a light by your bed that is easy to reach. Do not use any sheets or blankets that are too big for your bed. They should not hang down onto the floor. Have a firm chair that has side arms. You can use this for support while you get dressed. Do not have throw rugs and other things on the floor that can make you trip. What can I do in the kitchen? Clean up any spills right away. Avoid walking on wet floors. Keep items that you use a lot in easy-to-reach places. If you need to reach something above you, use a strong step stool that has a grab bar. Keep electrical cords out of the way. Do not use floor polish or wax that makes floors slippery. If you must use wax, use non-skid floor wax. Do not have throw rugs and other things on the floor that can make  you trip. What can I do with my stairs? Do not leave any items on the stairs. Make sure that there are handrails on both sides of the stairs and use them. Fix handrails that are broken or loose. Make sure that handrails are as long as the stairways. Check any carpeting to make sure that it is firmly attached to the stairs. Fix any carpet that is loose or worn. Avoid having throw rugs at the top or bottom of the stairs. If you do have throw rugs, attach them to the floor with carpet tape. Make sure that you have a light switch at the top of the stairs and the bottom of the stairs. If you do not have them, ask someone to add them for you. What else can I do to help prevent falls? Wear shoes that: Do not have high heels. Have rubber bottoms. Are comfortable and fit you well. Are closed at the toe. Do not wear sandals. If you use a stepladder: Make sure that it is fully opened. Do not climb a closed stepladder. Make sure that  both sides of the stepladder are locked into place. Ask someone to hold it for you, if possible. Clearly mark and make sure that you can see: Any grab bars or handrails. First and last steps. Where the edge of each step is. Use tools that help you move around (mobility aids) if they are needed. These include: Canes. Walkers. Scooters. Crutches. Turn on the lights when you go into a dark area. Replace any light bulbs as soon as they burn out. Set up your furniture so you have a clear path. Avoid moving your furniture around. If any of your floors are uneven, fix them. If there are any pets around you, be aware of where they are. Review your medicines with your doctor. Some medicines can make you feel dizzy. This can increase your chance of falling. Ask your doctor what other things that you can do to help prevent falls. This information is not intended to replace advice given to you by your health care provider. Make sure you discuss any questions you have with your health care provider. Document Released: 02/06/2009 Document Revised: 09/18/2015 Document Reviewed: 05/17/2014 Elsevier Interactive Patient Education  2017 [employer].

## 2022-01-12 ENCOUNTER — Other Ambulatory Visit: Payer: Self-pay

## 2022-01-12 ENCOUNTER — Ambulatory Visit (HOSPITAL_COMMUNITY)
Admission: RE | Admit: 2022-01-12 | Discharge: 2022-01-12 | Disposition: A | Discharge status: Home / Self Care | Payer: Medicare HMO | Source: Ambulatory Visit | Attending: Surgery | Admitting: Surgery

## 2022-01-12 ENCOUNTER — Other Ambulatory Visit (HOSPITAL_COMMUNITY): Payer: Self-pay | Admitting: Surgery

## 2022-01-12 ENCOUNTER — Ambulatory Visit (HOSPITAL_COMMUNITY)
Admission: RE | Admit: 2022-01-12 | Discharge: 2022-01-12 | Disposition: A | Discharge status: Home / Self Care | Payer: Medicare HMO | Source: Ambulatory Visit

## 2022-01-12 DIAGNOSIS — Z01818 Encounter for other preprocedural examination: Secondary | ICD-10-CM | POA: Diagnosis not present

## 2022-01-12 DIAGNOSIS — Z6841 Body Mass Index (BMI) 40.0 and over, adult: Secondary | ICD-10-CM | POA: Insufficient documentation

## 2022-01-13 ENCOUNTER — Encounter: Payer: Medicare HMO | Admitting: Speech Pathology

## 2022-01-13 ENCOUNTER — Ambulatory Visit: Payer: Medicare HMO | Admitting: Podiatry

## 2022-01-13 DIAGNOSIS — M79674 Pain in right toe(s): Secondary | ICD-10-CM | POA: Diagnosis not present

## 2022-01-13 DIAGNOSIS — B351 Tinea unguium: Secondary | ICD-10-CM | POA: Diagnosis not present

## 2022-01-13 DIAGNOSIS — M79675 Pain in left toe(s): Secondary | ICD-10-CM | POA: Diagnosis not present

## 2022-01-13 NOTE — Progress Notes (Signed)
   SUBJECTIVE Patient presents to office today complaining of elongated, thickened nails that cause pain while ambulating in shoes.  Patient is unable to trim their own nails. Patient is here for further evaluation and treatment.  Past Medical History:  Diagnosis Date   Allergy    dust, pollen, sulfa, prednisone   Anemia    Anxiety    Arthritis    "knees" (04/26/2016)   Colon polyps    benign per pt   Coronary artery disease    mild per 2015 cath in [place] (OM1 30%, RCA 30%)   GERD (gastroesophageal reflux disease)    Gout    History of hiatal hernia    Hyperlipidemia 05/20/2021   Hypertension    Migraine    "none since early /2017" (05/06/2016)   Obesity    OSA on CPAP    uses CPAP    OBJECTIVE General Patient is awake, alert, and oriented x 3 and in no acute distress. Derm Skin is dry and supple bilateral. Negative open lesions or macerations. Remaining integument unremarkable. Nails are tender, long, thickened and dystrophic with subungual debris, consistent with onychomycosis, 1-5 bilateral. No signs of infection noted. Vasc  DP and PT pedal pulses palpable bilaterally. Temperature gradient within normal limits.  Neuro Epicritic and protective threshold sensation grossly intact bilaterally.  Musculoskeletal Exam No symptomatic pedal deformities noted bilateral. Muscular strength within normal limits.  ASSESSMENT 1.  Pain due to onychomycosis of toenails both  PLAN OF CARE 1. Patient evaluated today.  2. Instructed to maintain good pedal hygiene and foot care.  3. Mechanical debridement of nails 1-5 bilaterally performed using a nail nipper. Filed with dremel without incident.  4. Return to clinic in 3 mos.    Rhyli Depaula M. Jacquette Canales, DPM [facility]  Dr. Kimani Bedoya M. Jett Kulzer, DPM    [address]                                     [place], [place] [address]                Office [phone]  Fax [phone]         

## 2022-01-14 DIAGNOSIS — Z01818 Encounter for other preprocedural examination: Secondary | ICD-10-CM | POA: Diagnosis not present

## 2022-01-14 DIAGNOSIS — D649 Anemia, unspecified: Secondary | ICD-10-CM | POA: Diagnosis not present

## 2022-01-14 DIAGNOSIS — I10 Essential (primary) hypertension: Secondary | ICD-10-CM | POA: Diagnosis not present

## 2022-01-14 DIAGNOSIS — Z6841 Body Mass Index (BMI) 40.0 and over, adult: Secondary | ICD-10-CM | POA: Diagnosis not present

## 2022-01-14 DIAGNOSIS — R7303 Prediabetes: Secondary | ICD-10-CM | POA: Diagnosis not present

## 2022-01-21 ENCOUNTER — Encounter: Payer: Medicare HMO | Attending: Family Medicine | Admitting: Dietician

## 2022-01-21 ENCOUNTER — Encounter: Payer: Self-pay | Admitting: Dietician

## 2022-01-21 DIAGNOSIS — I10 Essential (primary) hypertension: Secondary | ICD-10-CM | POA: Diagnosis not present

## 2022-01-21 DIAGNOSIS — Z6841 Body Mass Index (BMI) 40.0 and over, adult: Secondary | ICD-10-CM | POA: Diagnosis not present

## 2022-01-21 DIAGNOSIS — Z713 Dietary counseling and surveillance: Secondary | ICD-10-CM | POA: Insufficient documentation

## 2022-01-21 DIAGNOSIS — E669 Obesity, unspecified: Secondary | ICD-10-CM | POA: Insufficient documentation

## 2022-01-21 DIAGNOSIS — G4730 Sleep apnea, unspecified: Secondary | ICD-10-CM | POA: Diagnosis not present

## 2022-01-21 DIAGNOSIS — E785 Hyperlipidemia, unspecified: Secondary | ICD-10-CM | POA: Diagnosis not present

## 2022-01-21 NOTE — Progress Notes (Signed)
Nutrition Assessment for Bariatric Surgery Medical Nutrition Therapy Appt Start Time: 10:10    End Time: 11:12  Patient was seen on 01/21/2022 for Pre-Operative Nutrition Assessment. Letter of approval faxed to [facility] Surgery bariatric surgery program coordinator on 01/21/2022.   Referral stated Supervised Weight Loss (SWL) visits needed: 6 months  Planned surgery: Sleeve Gastrectomy   NUTRITION ASSESSMENT   Anthropometrics  Start weight at [facility]: 311.2 lbs (date: 01/21/2022)  Height: 61.5 in BMI: 57.85 kg/m2     Clinical  Medical hx: Anemia, anxiety, arthritis, GERD, Hyperlipidemia, HTN, sleep apnea Medications: Esomeprazole, rosuvastatin, duloxetine, ibuprofen, metoprolol tartrate, oxybutynin, triamterene HCTZ  Labs: A1C 6.4; glucose 106; hemoglobin 10.6; hematocrit 33.2 Notable signs/symptoms: none noted Any previous deficiencies? No  Micronutrient Nutrition Focused Physical Exam: Hair: No issues observed Eyes: No issues observed Mouth: No issues observed Neck: No issues observed Nails: No issues observed Skin: No issues observed  Lifestyle & Dietary Hx  Pt lives alone with her dog. The pt performs the food shopping and the pt prepares the meals. She reports that she typically skips or misses 7 out of 21 possible meals per week. She may have 2 meals per week that are take-out or at a restaurant.  Pt is retired. She denies binge eating and denies feeling shame and/or guilt after eating too much food.  She denies having used laxatives or vomiting to facilitate weight loss. She admits to emotional eating during times of stress. She states that she knows the difference between hunger and thirst and can tell when she is full. Patient lives alone with her dog. The pt performs the food shopping and the pt prepares the meals. She reports that she typically skips or misses 7 out of 21 possible meals per week. She may have 2 meals per week that are take-out or at a restaurant.   Pt states she was most successful with weight loss from portion control in the past.  Pt states she cut her sodas back, stating she has the short/small sodas. Pt states she uses a bread plate. Pt states she needs to lose 60 lbs to be able to get knee replacement surgery. Pt states she does some exercises that she can do lying straight on her back, stating she does that for about 20 minutes every morning.  Physical Activity: ADLs, silver sneakers twice a week  Sleep Hygiene: duration and quality: good, about 6-8 hours a night  Current Patient Perceived Stress Level as stated by pt on a scale of 1-10: 2      Stress Management Techniques: meditation, crochet   Fruit servings per week: 3-4 Non starchy vegetable servings per week: 6 Whole Grains per week: 2   24-Hr Dietary Recall First Meal: bagel and scrambled eggs Snack:  Second Meal: meat loaf, broccoli, mac and cheese Snack: fresh fruit or granola bar Third Meal: curry chicken, rice, vegetables Snack: piece of cut up fruit Beverages: soda, water, sweet tea, fruit juice  Alcoholic beverages per week: 0-1   Estimated Energy Needs Calories: 1500   NUTRITION DIAGNOSIS  Overweight/obesity ([place]-3.3) related to past poor dietary habits and physical inactivity as evidenced by patient w/ planned RYGB surgery following dietary guidelines for continued weight loss.    NUTRITION INTERVENTION  Nutrition counseling (C-1) and education (E-2) to facilitate bariatric surgery goals.  Educated pt on micronutrient deficiencies post surgery and strategies to mitigate that risk   Pre-Op Goals Reviewed with the Patient Track food and beverage intake (pen and paper, MyFitness Pal,  Baritastic app, etc.) Make healthy food choices while monitoring portion sizes Consume 3 meals per day or try to eat every 3-5 hours Avoid concentrated sugars and fried foods Keep sugar & fat in the single digits per serving on food labels Practice CHEWING your food  (aim for applesauce consistency) Practice not drinking 15 minutes before, during, and 30 minutes after each meal and snack Avoid all carbonated beverages (ex: soda, sparkling beverages)  Limit caffeinated beverages (ex: coffee, tea, energy drinks) Avoid all sugar-sweetened beverages (ex: regular soda, sports drinks)  Avoid alcohol  Aim for 64-100 ounces of FLUID daily (with at least half of fluid intake being plain water)  Aim for at least 60-80 grams of PROTEIN daily Look for a liquid protein source that contains ?15 g protein and ?5 g carbohydrate (ex: shakes, drinks, shots) Make a list of non-food related activities Physical activity is an important part of a healthy lifestyle so keep it moving! The goal is to reach 150 minutes of exercise per week, including cardiovascular and weight baring activity.  *Goals that are bolded indicate the pt would like to start working towards these  Handouts Provided Include  Bariatric Surgery handouts (Nutrition Visits, Pre-Op Goals, Protein Shakes, Vitamins & Minerals)  Learning Style & Readiness for Change Teaching method utilized: Visual & Auditory  Demonstrated degree of understanding via: Teach Back  Readiness Level: preparation Barriers to learning/adherence to lifestyle change: previous habits and mobility  RD's Notes for Next Visit Progress toward patient's chosen goals     MONITORING & EVALUATION Dietary intake, weekly physical activity, body weight, and pre-op goals reached at next nutrition visit.    Next Steps  Patient is to follow up at [facility] for Pre-Op Class >2 weeks before surgery for further nutrition education.

## 2022-01-24 DIAGNOSIS — G4733 Obstructive sleep apnea (adult) (pediatric): Secondary | ICD-10-CM | POA: Diagnosis not present

## 2022-02-08 ENCOUNTER — Ambulatory Visit: Payer: Self-pay

## 2022-02-08 ENCOUNTER — Ambulatory Visit: Payer: Medicare HMO | Admitting: Family Medicine

## 2022-02-08 ENCOUNTER — Encounter: Payer: Self-pay | Admitting: Family Medicine

## 2022-02-08 VITALS — BP 130/88 | Ht 61.5 in | Wt 311.0 lb

## 2022-02-08 DIAGNOSIS — M170 Bilateral primary osteoarthritis of knee: Secondary | ICD-10-CM | POA: Diagnosis not present

## 2022-02-08 DIAGNOSIS — M65352 Trigger finger, left little finger: Secondary | ICD-10-CM

## 2022-02-08 MED ORDER — TRIAMCINOLONE ACETONIDE 40 MG/ML IJ SUSP
40.0000 mg | Freq: Once | INTRAMUSCULAR | Status: AC
  Administered 2022-02-08: 40 mg via INTRA_ARTICULAR

## 2022-02-08 NOTE — Patient Instructions (Signed)
Good to see you Please try splinting at night for your finger  We'll run the benefits for the zilretta for the knee injection  Please send me a message in MyChart with any questions or updates.  Please see me back to have the injection.   --Dr. Raeford Razor

## 2022-02-08 NOTE — Progress Notes (Signed)
Christine [name] - 71 y.o. female MRN [id number]  Date of birth: [birth year]  SUBJECTIVE:  Including CC & ROS.  No chief complaint on file.   Christine [name] is a 71 y.o. female that is presenting with acute on chronic bilateral knee pain.  She has known degenerative changes in each knee.  She is pursuing gastric bypass in order to have knee replacement.  Having acute trigger finger in the left hand.    Review of Systems See HPI   HISTORY: Past Medical, Surgical, Social, and Family History Reviewed & Updated per EMR.   Pertinent Historical Findings include:  Past Medical History:  Diagnosis Date   Allergy    dust, pollen, sulfa, prednisone   Anemia    Anxiety    Arthritis    "knees" (04/26/2016)   Colon polyps    benign per pt   Coronary artery disease    mild per 2015 cath in [place] (OM1 30%, RCA 30%)   GERD (gastroesophageal reflux disease)    Gout    History of hiatal hernia    Hyperlipidemia 05/20/2021   Hypertension    Migraine    "none since early /2017" (05/06/2016)   Obesity    OSA on CPAP    uses CPAP    Past Surgical History:  Procedure Laterality Date   ABDOMINAL HYSTERECTOMY     "partial"; both ovaries present   APPENDECTOMY  05/06/2016   CARDIAC CATHETERIZATION  ~ 2015   CARDIAC CATHETERIZATION  2015   In [place] Right 06/01/2017   Procedure: [place];  Surgeon: Edrick Kins, DPM;  Location: [facility];  Service: Podiatry;  Laterality: Right;   LAPAROSCOPIC APPENDECTOMY N/A 05/06/2016   Procedure: LAPAROSCOPIC APPENDECTOMY;  Surgeon: Donnie Mesa, MD;  Location: [facility];  Service: General;  Laterality: N/A;   TONSILLECTOMY       PHYSICAL EXAM:  VS: BP 130/88 (BP Location: Left Arm, Patient Position: Sitting)   Ht 5' 1.5" (1.562 m)   Wt (!) 311 lb (141.1 kg)   BMI 57.81 kg/m  Physical Exam Gen: NAD, alert, cooperative with exam, well-appearing MSK:  Neurovascularly intact    Limited ultrasound: Left hand:  Triggering  of the fifth flexor tendon observed on ultrasound  Summary: Trigger finger of the fifth flexor tendon  Ultrasound and interpretation by Clearance Coots, MD   Aspiration/Injection Procedure Note Shanquita Ronning Apr 10, 1951  Procedure: Injection Indications: Left fifth trigger finger  Procedure Details Consent: Risks of procedure as well as the alternatives and risks of each were explained to the (patient/caregiver).  Consent for procedure obtained. Time Out: Verified patient identification, verified procedure, site/side was marked, verified correct patient position, special equipment/implants available, medications/allergies/relevent history reviewed, required imaging and test results available.  Performed.  The area was cleaned with iodine and alcohol swabs.    The left Raeford Razor trigger finger was injected using 1 cc's of 40 mg Kenalog and 1 cc's of 0.25% bupivacaine with a 25 1 1/2" needle.  Ultrasound was used. Images were obtained in long views showing the injection.     A sterile dressing was applied.  Patient did tolerate procedure well.    ASSESSMENT & PLAN:   OA (osteoarthritis) of knee Acute on chronic in nature.  Has not done well with previous steroids.  Has known degenerative changes and has trouble with her normal daily activities. -Counseled on home exercise therapy and supportive care. -Pursue Zilretta injection. -Could consider physical therapy.  Trigger little finger of left  hand Acutely occurring. Has done well with previous injection.  -Counseled on home exercise therapy and supportive care. -Injection today. -Could consider release.

## 2022-02-08 NOTE — Assessment & Plan Note (Signed)
Acute on chronic in nature.  Has not done well with previous steroids.  Has known degenerative changes and has trouble with her normal daily activities. -Counseled on home exercise therapy and supportive care. -Pursue Zilretta injection. -Could consider physical therapy.

## 2022-02-08 NOTE — Assessment & Plan Note (Signed)
Acutely occurring. Has done well with previous injection.  -Counseled on home exercise therapy and supportive care. -Injection today. -Could consider release.

## 2022-02-10 ENCOUNTER — Telehealth: Payer: Self-pay | Admitting: Family Medicine

## 2022-02-10 NOTE — Telephone Encounter (Signed)
Submitted request for bilateral zilretta today. Uploaded last office note to portal.

## 2022-02-11 NOTE — Telephone Encounter (Signed)
Update from [employer] forward: [place] does not have broad-based coverage for Zilretta but may provide coverage with medical necessity.? To determine if your patient might have coverage for Zilretta, [employer] recommends that the provider may initiate a pre-determination with the plan. Please fax the attached pre-determination form and medical records to the [facility] Pre-Determination Department directly at [phone].  I completed pre-determination form and faxed it to [facility]. Also sent request for flex forward to help with the RX prior auth. Faxed them the clinicals.

## 2022-02-15 NOTE — Telephone Encounter (Signed)
Sent a request today for a status update.Waiting for response.

## 2022-02-15 NOTE — Telephone Encounter (Signed)
Bilateral Zilretta injections approved by [employer] from 02/12/22 to 05/15/22.   Junie Panning, approval is on file from [place].  Do you know her out of pocket cost? Does she have a copay? Does the plan cover her bilateral Zilretta at 100%?

## 2022-02-16 ENCOUNTER — Encounter: Payer: Self-pay | Admitting: Dietician

## 2022-02-16 ENCOUNTER — Encounter: Payer: Medicare HMO | Attending: Family Medicine | Admitting: Dietician

## 2022-02-16 DIAGNOSIS — I10 Essential (primary) hypertension: Secondary | ICD-10-CM | POA: Diagnosis not present

## 2022-02-16 DIAGNOSIS — Z6841 Body Mass Index (BMI) 40.0 and over, adult: Secondary | ICD-10-CM | POA: Insufficient documentation

## 2022-02-16 DIAGNOSIS — K219 Gastro-esophageal reflux disease without esophagitis: Secondary | ICD-10-CM | POA: Insufficient documentation

## 2022-02-16 DIAGNOSIS — Z713 Dietary counseling and surveillance: Secondary | ICD-10-CM | POA: Insufficient documentation

## 2022-02-16 DIAGNOSIS — E785 Hyperlipidemia, unspecified: Secondary | ICD-10-CM | POA: Insufficient documentation

## 2022-02-16 DIAGNOSIS — E669 Obesity, unspecified: Secondary | ICD-10-CM | POA: Insufficient documentation

## 2022-02-16 NOTE — Progress Notes (Signed)
Supervised Weight Loss Visit Bariatric Nutrition Education Appt Start Time: 12:04    End Time: 12:26  1 out of 6 SWL Appointments   Planned surgery: Sleeve Gastrectomy   NUTRITION ASSESSMENT   Anthropometrics  Start weight at [facility]: 311.2 lbs (date: 01/21/2022) Height: 61.5 in Weight today: 294.8 lbs. BMI: 54.80 kg/m2     Clinical  Medical hx: Anemia, anxiety, arthritis, GERD, Hyperlipidemia, HTN, sleep apnea Medications: Esomeprazole, rosuvastatin, duloxetine, ibuprofen, metoprolol tartrate, oxybutynin, triamterene HCTZ  Labs: A1C 6.4; glucose 106; hemoglobin 10.6; hematocrit 33.2 Notable signs/symptoms: none noted Any previous deficiencies? No  Lifestyle & Dietary Hx  Pt arrived with notebook to take notes. Pt states she is having trouble with her dog being sick, but the dog is feeling better now. Pt state she is not getting enough fluid, stating she puts water by the bed to drink when waking during the middle of the night. Pt states she is struggling getting 3 meals a day, stating in the morning it is just a snack. Pt states she gets up in the morning around 10 am, stating she is trying to get up at 9 am now, so she can eat earlier.  Pt states she goes to bed any where from 8 pm to 12 am.  Pt states her alarm goes off at 8 am, stating she will stay in bed to watch/listen to Dr. Abbe Amsterdam on television. Pt states today is a bad day for her knee pain. Pt states she is doing well not drinking as much soda.  Estimated daily fluid intake: 40-45 oz Supplements: Iron, fish oil Current average weekly physical activity: ADLs  24-Hr Dietary Recall First Meal: watermelon, raisin toast Snack:  Second Meal: sandwich ([place]) Snack: fruit Third Meal: roast beef, yellow rice, string beans, carrots (take out from [employer]) Snack: fruit Beverages: water, soda, water with flavorings  Estimated Energy Needs Calories: 1500  NUTRITION DIAGNOSIS  Overweight/obesity ([place]-3.3) related to  past poor dietary habits and physical inactivity as evidenced by patient w/ planned sleeve surgery following dietary guidelines for continued weight loss.   NUTRITION INTERVENTION  Nutrition counseling (C-1) and education (E-2) to facilitate bariatric surgery goals.  Pre-Op Goals Reviewed with the Patient Track food and beverage intake (pen and paper, MyFitness Pal, Baritastic app, etc.) Make healthy food choices while monitoring portion sizes Consume 3 meals per day or try to eat every 3-5 hours Avoid concentrated sugars and fried foods Keep sugar & fat in the single digits per serving on food labels Practice CHEWING your food (aim for applesauce consistency) Practice not drinking 15 minutes before, during, and 30 minutes after each meal and snack Avoid all carbonated beverages (ex: soda, sparkling beverages)  Limit caffeinated beverages (ex: coffee, tea, energy drinks) Avoid all sugar-sweetened beverages (ex: regular soda, sports drinks)  Avoid alcohol  Aim for 64-100 ounces of FLUID daily (with at least half of fluid intake being plain water)  Aim for at least 60-80 grams of PROTEIN daily Look for a liquid protein source that contains ?15 g protein and ?5 g carbohydrate (ex: shakes, drinks, shots) Make a list of non-food related activities Physical activity is an important part of a healthy lifestyle so keep it moving! The goal is to reach 150 minutes of exercise per week, including cardiovascular and weight baring activity.  *Goals that are bolded indicate the pt would like to start working towards these  Pre-Op Goals Progress & New Goals Create consistency, by having a bed time and wake up time.  Eat within 1.5 hours of waking up in the morning. Avoid all sugar-sweetened beverages (ex: regular soda, sports drinks)  Avoid all carbonated beverages (ex: soda, sparkling beverages)  Consume 3 meals per day or try to eat every 3-5 hours  Handouts Provided Include    Learning Style &  Readiness for Change Teaching method utilized: Visual & Auditory  Demonstrated degree of understanding via: Teach Back  Readiness Level: contemplative  Barriers to learning/adherence to lifestyle change: mobility and previous habits  RD's Notes for next Visit  Progress toward patient's chosen goals  MONITORING & EVALUATION Dietary intake, weekly physical activity, body weight, and pre-op goals in 1 month.   Next Steps  Patient is to return to [facility] in one month for next SWL visit.

## 2022-02-18 NOTE — Telephone Encounter (Signed)
I attempted to call the patient's insurance and spent 30 minutes on the phone getting passed around then got stuck on the automated system. [place] does not provide patient details to 3rd party callers so [employer] was not able to get the information. I attempted to have it faxed to the office but the automated system was asking for information I was not familiar with. Erline Levine, I am sure you are more familiar with what they need to send it. The [place] provider number is [phone].

## 2022-02-22 NOTE — Telephone Encounter (Signed)
Pt informed of below.  She only wishes to do her right knee at this time die to cost. OV scheduled 03/10/22.  I called [employer]- Patient's plan covers Zilretta at 80%. With remaining 20% owed by patient. Patient has a $25 copay for [id number]. No deductible applies. Patient has $4500 OOP max, $524.95 has been met.  Call ref #: [id number].

## 2022-02-24 DIAGNOSIS — G4733 Obstructive sleep apnea (adult) (pediatric): Secondary | ICD-10-CM | POA: Diagnosis not present

## 2022-03-01 ENCOUNTER — Encounter: Payer: Self-pay | Admitting: Gastroenterology

## 2022-03-01 ENCOUNTER — Ambulatory Visit (INDEPENDENT_AMBULATORY_CARE_PROVIDER_SITE_OTHER): Payer: Medicare HMO | Admitting: Gastroenterology

## 2022-03-01 VITALS — BP 130/86 | HR 73 | Ht 61.5 in | Wt 296.1 lb

## 2022-03-01 DIAGNOSIS — K219 Gastro-esophageal reflux disease without esophagitis: Secondary | ICD-10-CM

## 2022-03-01 NOTE — Patient Instructions (Signed)
_______________________________________________________  If you are age 69 or older, your body mass index should be between 23-30. Your Body mass index is 55.05 kg/m. If this is out of the aforementioned range listed, please consider follow up with your Primary Care Provider.  If you are age 95 or younger, your body mass index should be between 19-25. Your Body mass index is 55.05 kg/m. If this is out of the aformentioned range listed, please consider follow up with your Primary Care Provider.   ________________________________________________________  The [facility] providers would like to encourage you to use [facility] to communicate with providers for non-urgent requests or questions.  Due to long hold times on the telephone, sending your provider a message by [facility] may be a faster and more efficient way to get a response.  Please allow 48 business hours for a response.  Please remember that this is for non-urgent requests.  _______________________________________________________  Christine [name] have been scheduled for an endoscopy. Please follow the written instructions given to you at your visit today. Please pick up your prep supplies at the pharmacy within the next 1-3 days. If you use inhalers (even only as needed), please bring them with you on the day of your procedure.   Due to recent changes in healthcare laws, you may see the results of your imaging and laboratory studies on MyChart before your provider has had a chance to review them.  We understand that in some cases there may be results that are confusing or concerning to you. Not all laboratory results come back in the same time frame and the provider may be waiting for multiple results in order to interpret others.  Please give [place] 48 hours in order for your provider to thoroughly review all the results before contacting the office for clarification of your results.   It was a pleasure to see you today!  Thank you for trusting me with your  gastrointestinal care!

## 2022-03-01 NOTE — Progress Notes (Addendum)
[facility] Consult Note:  History: Christine [name] 03/01/2022  Referring provider: Louanna Raw, MD  Reason for consult/chief complaint: Colonoscopy, EGD, and Weight Loss Surgery (Pt is here to ger procedures done in order to get weight loss surgery done no complaints at this visit.)   Subjective  HPI:  I saw Christine [name] in office consultation March 2019 for longstanding normocytic anemia with normal iron and B12 levels.  She had [place]  It did not seem to me that this bleeding was of a GI cause, and I ordered Hemoccult cards that were subsequently negative.  She was seen again in 2020 referred by primary care for lower abdominal pain with a normal CT scan, dicyclomine prescribed.  On this occasion, she has been referred by Dr. Louanna Raw of [facility] surgery with request for preoperative upper endoscopy with biopsy to rule out H. pylori as well as his screening colonoscopy prior to bariatric surgery. (See prior notes - she had no polyps [place])  She has intermittent heartburn or regurgitation, generally under good control with once daily Nexium.  Denies dysphagia, early satiety, nausea or vomiting. She does not recall having previously been tested or treated for H. pylori. ROS:  Review of Systems  Constitutional:  Negative for appetite change and unexpected weight change.  HENT:  Negative for mouth sores and voice change.   Eyes:  Negative for pain and redness.  Respiratory:  Negative for cough and shortness of breath.   Cardiovascular:  Negative for chest pain and palpitations.  Genitourinary:  Negative for dysuria and hematuria.  Musculoskeletal:  Positive for arthralgias. Negative for myalgias.  Skin:  Negative for pallor and rash.  Neurological:  Negative for weakness and headaches.  Hematological:  Negative for adenopathy.     Past Medical  History: Past Medical History:  Diagnosis Date   Allergy    dust, pollen, sulfa, prednisone   Anemia    Anxiety    Arthritis    "knees" (04/26/2016)   Colon polyps    benign per pt   Coronary artery disease    mild per 2015 cath in [place] (OM1 30%, RCA 30%)   GERD (gastroesophageal reflux disease)    Gout    History of hiatal hernia    Hyperlipidemia 05/20/2021   Hypertension    Migraine    "none since early /2017" (05/06/2016)   Obesity    OSA on CPAP    uses CPAP     Past Surgical History: Past Surgical History:  Procedure Laterality Date   ABDOMINAL HYSTERECTOMY     "partial"; both ovaries present   APPENDECTOMY  05/06/2016   CARDIAC CATHETERIZATION  ~ 2015   CARDIAC CATHETERIZATION  2015   In [place] Right 06/01/2017   Procedure: [place];  Surgeon: Edrick Kins, DPM;  Location: [place];  Service: Podiatry;  Laterality: Right;   LAPAROSCOPIC APPENDECTOMY N/A 05/06/2016   Procedure: LAPAROSCOPIC APPENDECTOMY;  Surgeon: Donnie Mesa, MD;  Location: [facility];  Service: General;  Laterality: N/A;   TONSILLECTOMY       Family History: Family History  Problem Relation Age of Onset   Diabetes Mother    Heart disease Mother    High blood pressure Mother    Asthma Mother    Arthritis Mother    Diabetes Father    Heart disease Father    High blood pressure Father  Asthma Father    Diabetes Sister    Alcohol abuse Brother    Drug abuse Brother    Arthritis Sister    Diabetes Sister    Varicose Veins Sister    Colon cancer Neg Hx    Esophageal cancer Neg Hx    Rectal cancer Neg Hx     Social History: Social History   Socioeconomic History   Marital status: Single    Spouse name: Not on file   Number of children: 0   Years of education: Not on file   Highest education level: Not on file  Occupational History   Occupation: retired  Tobacco Use   Smoking status: Former    Packs/day: 0.12    Years: 5.00    Total pack years:  0.60    Types: Cigarettes    Quit date: 04/26/1978    Years since quitting: 43.8   Smokeless tobacco: Never  Vaping Use   Vaping Use: Never used  Substance and Sexual Activity   Alcohol use: No   Drug use: No   Sexual activity: Not Currently    Birth control/protection: None  Other Topics Concern   Not on file  Social History Narrative   Right handed    Lives alone with dog    Social Determinants of Health   Financial Resource Strain: Low Risk  (01/10/2021)   Overall Financial Resource Strain (CARDIA)    Difficulty of Paying Living Expenses: Not hard at all  Food Insecurity: No Food Insecurity (01/10/2021)   Hunger Vital Sign    Worried About Running Out of Food in the Last Year: Never true    [place] in the Last Year: Never true  Transportation Needs: No Transportation Needs (01/10/2021)   PRAPARE - Hydrologist (Medical): No    Lack of Transportation (Non-Medical): No  Physical Activity: Insufficiently Active (01/10/2021)   Exercise Vital Sign    Days of Exercise per Week: 3 days    Minutes of Exercise per Session: 30 min  Stress: No Stress Concern Present (01/10/2021)   [place]    Feeling of Stress : Not at all  Social Connections: Socially Isolated (01/10/2021)   Social Connection and Isolation Panel [NHANES]    Frequency of Communication with Friends and Family: More than three times a week    Frequency of Social Gatherings with Friends and Family: More than three times a week    Attends Religious Services: Never    Marine scientist or Organizations: No    Attends Archivist Meetings: Never    Marital Status: Never married    Allergies: Allergies  Allergen Reactions   Prednisone Swelling    Legs swell    Tape    Sulfa Antibiotics Rash    Outpatient Meds: Current Outpatient Medications  Medication Sig Dispense Refill   aspirin 81 MG EC tablet  Take by mouth.     DULoxetine (CYMBALTA) 60 MG capsule Take 1 capsule (60 mg total) by mouth daily. 90 capsule 1   esomeprazole (NEXIUM) 40 MG capsule Take 1 capsule (40 mg total) by mouth daily. 90 capsule 1   ibuprofen (ADVIL) 800 MG tablet Take 1 tablet (800 mg total) by mouth every 8 (eight) hours as needed. 90 tablet 0   metoprolol tartrate (LOPRESSOR) 50 MG tablet Take 1 tablet (50 mg total) by mouth daily. 90 tablet 1   oxybutynin (DITROPAN  XL) 15 MG 24 hr tablet TAKE ONE TABLET BY MOUTH EVERY NIGHT AT BEDTIME 30 tablet 11   rosuvastatin (CRESTOR) 10 MG tablet Take 1 tablet (10 mg total) by mouth daily. 90 tablet 3   triamterene-hydrochlorothiazide (MAXZIDE) 75-50 MG tablet TAKE 1 TABLET BY MOUTH EVERY DAY 30 tablet 8   No current facility-administered medications for this visit.      ___________________________________________________________________ Objective   Exam:  BP 130/86   Pulse 73   Ht 5' 1.5" (1.562 m)   Wt 296 lb 2 oz (134.3 kg)   BMI 55.05 kg/m  Wt Readings from Last 3 Encounters:  03/01/22 296 lb 2 oz (134.3 kg)  02/16/22 294 lb 12.8 oz (133.7 kg)  02/08/22 (!) 311 lb (141.1 kg)    General: Pleasant and well-appearing Eyes: sclera anicteric, no redness ENT: oral mucosa moist without lesions, no cervical or supraclavicular lymphadenopathy CV: Regular without murmur, no JVD, pitting edema to knee bilaterally Resp: clear to auscultation bilaterally, normal RR and effort noted GI: soft, morbidly obese, no tenderness, with active bowel sounds.  Unable to assess for mass or organomegaly due to body habitus Skin; warm and dry, no rash or jaundice noted Neuro: awake, alert and oriented x 3. Normal gross motor function and fluent speech  Labs:     Latest Ref Rng & Units 11/19/2021   10:09 AM 05/20/2021    9:52 AM 11/17/2020   11:10 AM  CBC  WBC 4.0 - 10.5 K/uL 5.4  6.3  5.3   Hemoglobin 12.0 - 15.0 g/dL 10.2  10.3  9.3   Hematocrit 36.0 - 46.0 % 32.5  32.4   29.1   Platelets 150.0 - 400.0 K/uL 233.0  244.0  262.0       Latest Ref Rng & Units 11/19/2021   10:09 AM 05/20/2021    9:52 AM 01/07/2021   10:04 AM  CMP  Glucose 70 - 99 mg/dL 103  87    BUN 6 - 23 mg/dL 29  24    Creatinine 0.40 - 1.20 mg/dL 1.03  0.90    Sodium 135 - 145 mEq/L 141  139    Potassium 3.5 - 5.1 mEq/L 3.9  3.9    Chloride 96 - 112 mEq/L 108  105    CO2 19 - 32 mEq/L 25  27    Calcium 8.4 - 10.5 mg/dL 9.3  9.1    Total Protein 6.0 - 8.3 g/dL 6.9  6.9  7.4   Total Bilirubin 0.2 - 1.2 mg/dL 0.6  0.6  0.4   Alkaline Phos 39 - 117 U/L 78  79  86   AST 0 - 37 U/L '14  13  13   '$ ALT 0 - 35 U/L '12  10  11      '$ Assessment: Encounter Diagnoses  Name Primary?   Gastroesophageal reflux disease, unspecified whether esophagitis present Yes   Morbid obesity ([place])     Reflux symptoms generally under good control on PPI, no red flag symptoms.  Patient is planning gastric bypass and surgeon has requested upper endoscopic evaluation of the anatomy and biopsy to rule out H. pylori. Regarding colorectal cancer screening, she is not due for screening until summer 2025.  She was agreeable to an EGD after discussion of procedure and risks.  The benefits and risks of the planned procedure were described in detail with the patient or (when appropriate) their health care proxy.  Risks were outlined as including, but not limited to,  bleeding, infection, perforation, adverse medication reaction leading to cardiac or pulmonary decompensation, pancreatitis (if ERCP).  The limitation of incomplete mucosal visualization was also discussed.  No guarantees or warranties were given.  Patient at increased risk for cardiopulmonary complications of procedure due to medical comorbidities.  Procedure must be done in the hospital outpatient endoscopy lab due to elevated BMI.   Thank you for the courtesy of this consult.  Please call me with any questions or concerns.  Nelida Meuse III  CC:  Referring provider noted above

## 2022-03-04 ENCOUNTER — Other Ambulatory Visit: Payer: Self-pay | Admitting: Family Medicine

## 2022-03-04 MED ORDER — IBUPROFEN 800 MG PO TABS: 800.0000 mg | ORAL_TABLET | Freq: Three times a day (TID) | ORAL | 0 refills | Status: DC | PRN

## 2022-03-10 ENCOUNTER — Ambulatory Visit: Payer: Medicare HMO | Admitting: Family Medicine

## 2022-03-16 ENCOUNTER — Ambulatory Visit (INDEPENDENT_AMBULATORY_CARE_PROVIDER_SITE_OTHER): Payer: Medicare HMO | Admitting: Family Medicine

## 2022-03-16 ENCOUNTER — Encounter: Payer: Self-pay | Admitting: Family Medicine

## 2022-03-16 ENCOUNTER — Ambulatory Visit: Payer: Self-pay

## 2022-03-16 VITALS — BP 136/70 | Ht 61.0 in | Wt 293.0 lb

## 2022-03-16 DIAGNOSIS — M1711 Unilateral primary osteoarthritis, right knee: Secondary | ICD-10-CM

## 2022-03-16 MED ORDER — TRIAMCINOLONE ACETONIDE 32 MG IX SRER
32.0000 mg | Freq: Once | INTRA_ARTICULAR | Status: AC
  Administered 2022-03-16: 32 mg via INTRA_ARTICULAR

## 2022-03-16 NOTE — Progress Notes (Signed)
  Christine [name] - 71 y.o. female MRN [id number]  Date of birth: 05-19-1950  SUBJECTIVE:  Including CC & ROS.  No chief complaint on file.   Christine [name] is a 71 y.o. female that is  here for zilretta injection.    Review of Systems See HPI   HISTORY: Past Medical, Surgical, Social, and Family History Reviewed & Updated per EMR.   Pertinent Historical Findings include:  Past Medical History:  Diagnosis Date   Allergy    dust, pollen, sulfa, prednisone   Anemia    Anxiety    Arthritis    "knees" (04/26/2016)   Colon polyps    benign per pt   Coronary artery disease    mild per 2015 cath in [place] (OM1 30%, RCA 30%)   GERD (gastroesophageal reflux disease)    Gout    History of hiatal hernia    Hyperlipidemia 05/20/2021   Hypertension    Migraine    "none since early /2017" (05/06/2016)   Obesity    OSA on CPAP    uses CPAP    Past Surgical History:  Procedure Laterality Date   ABDOMINAL HYSTERECTOMY     "partial"; both ovaries present   APPENDECTOMY  05/06/2016   CARDIAC CATHETERIZATION  ~ 2015   CARDIAC CATHETERIZATION  2015   In [place] Right 06/01/2017   Procedure: [place];  Surgeon: Edrick Kins, DPM;  Location: [place];  Service: Podiatry;  Laterality: Right;   LAPAROSCOPIC APPENDECTOMY N/A 05/06/2016   Procedure: LAPAROSCOPIC APPENDECTOMY;  Surgeon: Donnie Mesa, MD;  Location: [place];  Service: General;  Laterality: N/A;   TONSILLECTOMY       PHYSICAL EXAM:  VS: BP 136/70   Ht '5\' 1"'$  (1.549 m)   Wt 293 lb (132.9 kg)   BMI 55.36 kg/m  Physical Exam Gen: NAD, alert, cooperative with exam, well-appearing MSK:  Neurovascularly intact     Aspiration/Injection Procedure Note Christine [name] Jan 05, 1951  Procedure: Injection Indications: right knee pain  Procedure Details Consent: Risks of procedure as well as the alternatives and risks of each were explained to the (patient/caregiver).  Consent for procedure  obtained. Time Out: Verified patient identification, verified procedure, site/side was marked, verified correct patient position, special equipment/implants available, medications/allergies/relevent history reviewed, required imaging and test results available.  Performed.  The area was cleaned with iodine and alcohol swabs.    The right knee superior lateral suprapatellar pouch was injected using 3 cc of 1% lidocaine on a 21-gauge 2 inch needle.   The syringe was switched and a mixture containing 5 cc's of 32 mg Zilretta and 4 cc's of 0.25% bupivacaine was injected.  Ultrasound was used. Images were obtained in long views showing the injection.    A sterile dressing was applied.  Patient did tolerate procedure well.       ASSESSMENT & PLAN:   OA (osteoarthritis) of knee Completed zilretta injection

## 2022-03-16 NOTE — Patient Instructions (Signed)
Good to see you Please use ice as needed   Please send me a message in MyChart with any questions or updates.  Please see me back in 2 months.   --Dr. Raeford Razor

## 2022-03-16 NOTE — Assessment & Plan Note (Signed)
Completed zilretta injection.  ?

## 2022-03-22 ENCOUNTER — Encounter: Payer: Medicare HMO | Attending: Surgery | Admitting: Dietician

## 2022-03-22 ENCOUNTER — Encounter: Payer: Self-pay | Admitting: Dietician

## 2022-03-22 VITALS — Ht 61.5 in | Wt 296.5 lb

## 2022-03-22 DIAGNOSIS — Z713 Dietary counseling and surveillance: Secondary | ICD-10-CM | POA: Insufficient documentation

## 2022-03-22 DIAGNOSIS — K219 Gastro-esophageal reflux disease without esophagitis: Secondary | ICD-10-CM | POA: Insufficient documentation

## 2022-03-22 DIAGNOSIS — I10 Essential (primary) hypertension: Secondary | ICD-10-CM | POA: Diagnosis not present

## 2022-03-22 DIAGNOSIS — E785 Hyperlipidemia, unspecified: Secondary | ICD-10-CM | POA: Insufficient documentation

## 2022-03-22 DIAGNOSIS — Z6841 Body Mass Index (BMI) 40.0 and over, adult: Secondary | ICD-10-CM | POA: Insufficient documentation

## 2022-03-22 DIAGNOSIS — E669 Obesity, unspecified: Secondary | ICD-10-CM | POA: Insufficient documentation

## 2022-03-22 NOTE — Progress Notes (Addendum)
Supervised Weight Loss Visit Bariatric Nutrition Education Appt Start Time:   11:55  End Time: 12:29  2 out of 6 SWL Appointments   Planned surgery: Sleeve Gastrectomy   NUTRITION ASSESSMENT   Anthropometrics  Start weight at [facility]: 311.2 lbs (date: 01/21/2022) Height: 61.5 in Weight today: 296.5 lbs. BMI: 55.12 kg/m2     Clinical  Medical hx: Anemia, anxiety, arthritis, GERD, Hyperlipidemia, HTN, sleep apnea Medications: Esomeprazole, rosuvastatin, duloxetine, ibuprofen, metoprolol tartrate, oxybutynin, triamterene HCTZ  Labs: A1C 6.4; glucose 106; hemoglobin 10.6; hematocrit 33.2 Notable signs/symptoms: none noted Any previous deficiencies? No  Lifestyle & Dietary Hx  Pt not doing well without her dog, stating her dog died in 2023-02-17. Pt states she has only had one soda (thanksgiving day) this month. Pt states she has skipped meals, stating she hasn't felt like eating, stating she noticed sometimes she is not hungry. Pt states she is using her stationary bike since she has gotten a steroid shot in her right knee last week.  Estimated daily fluid intake: fluctuating (mostly water) 40-45 oz Supplements: Iron, fish oil Current average weekly physical activity: ADLs, stationary bike, 3 days a week for 20 minutes  24-Hr Dietary Recall First Meal: boiled egg, [place] bacon or skip Snack: watermelon Second Meal: left overs from Thanksgiving meal Snack: granola bar Third Meal: small can of spaghetti or [place]ouffers broccoli with mac and cheese and [place] sausage. Snack: fruit Beverages: water, water with flavorings, tea with Splenda, fruit juice  Estimated Energy Needs Calories: 1500  NUTRITION DIAGNOSIS  Overweight/obesity ([place]-3.3) related to past poor dietary habits and physical inactivity as evidenced by patient w/ planned sleeve surgery following dietary guidelines for continued weight loss.   NUTRITION INTERVENTION  Nutrition counseling (C-1) and education (E-2) to  facilitate bariatric surgery goals.  Why you need complex carbohydrates: Whole grains and other complex carbohydrates are required to have a healthy diet. Whole grains provide fiber which can help with blood glucose levels and help keep you satiated. Fruits and starchy vegetables provide essential vitamins and minerals required for immune function, eyesight support, brain support, bone density, wound healing and many other functions within the body. According to the current evidenced based 2020-2025 Dietary Guidelines for Americans, complex carbohydrates are part of a healthy eating pattern which is associated with a decreased risk for type 2 diabetes, cancers, and cardiovascular disease.    Pre-Op Goals Progress & New Goals Create routine/consistency, by having a bed time and wake up time. Eat within 1.5 hours of waking up in the morning. Avoid all sugar-sweetened beverages (ex: regular soda, sports drinks)  Avoid all carbonated beverages (ex: soda, sparkling beverages)  Re-engage: Consume 3 meals per day or try to eat every 3-5 hours New: Practice not drinking 15 minutes before, during, and 30 minutes after each meal and snack  Handouts Provided Include  Meal Ideas Handout  Learning Style & Readiness for Change Teaching method utilized: Visual & Auditory  Demonstrated degree of understanding via: Teach Back  Readiness Level: contemplative  Barriers to learning/adherence to lifestyle change: mobility and previous habits  RD's Notes for next Visit  Progress toward patient's chosen goals  MONITORING & EVALUATION Dietary intake, weekly physical activity, body weight, and pre-op goals in 1 month.   Next Steps  Patient is to return to [facility] in one month for next SWL visit.

## 2022-03-25 ENCOUNTER — Encounter: Payer: Self-pay | Admitting: Family Medicine

## 2022-03-25 MED ORDER — TRIAMTERENE-HCTZ 75-50 MG PO TABS: 1.0000 | ORAL_TABLET | Freq: Every day | ORAL | 8 refills | Status: DC

## 2022-03-31 ENCOUNTER — Encounter (HOSPITAL_COMMUNITY): Payer: Self-pay | Admitting: Gastroenterology

## 2022-04-01 DIAGNOSIS — G4733 Obstructive sleep apnea (adult) (pediatric): Secondary | ICD-10-CM | POA: Diagnosis not present

## 2022-04-07 ENCOUNTER — Encounter: Payer: Self-pay | Admitting: Podiatry

## 2022-04-08 ENCOUNTER — Other Ambulatory Visit: Payer: Self-pay

## 2022-04-08 ENCOUNTER — Encounter (HOSPITAL_COMMUNITY): Payer: Self-pay | Admitting: Gastroenterology

## 2022-04-08 ENCOUNTER — Ambulatory Visit (HOSPITAL_BASED_OUTPATIENT_CLINIC_OR_DEPARTMENT_OTHER): Payer: Medicare HMO | Admitting: Certified Registered Nurse Anesthetist

## 2022-04-08 ENCOUNTER — Ambulatory Visit (HOSPITAL_COMMUNITY)
Admission: RE | Admit: 2022-04-08 | Discharge: 2022-04-08 | Disposition: A | Discharge status: Home / Self Care | Payer: Medicare HMO | Attending: Gastroenterology | Admitting: Gastroenterology

## 2022-04-08 ENCOUNTER — Ambulatory Visit (HOSPITAL_COMMUNITY): Payer: Medicare HMO | Admitting: Certified Registered Nurse Anesthetist

## 2022-04-08 ENCOUNTER — Encounter (HOSPITAL_COMMUNITY)
Admission: RE | Disposition: A | Discharge status: Home / Self Care | Payer: Self-pay | Source: Home / Self Care | Attending: Gastroenterology

## 2022-04-08 DIAGNOSIS — Z87891 Personal history of nicotine dependence: Secondary | ICD-10-CM | POA: Diagnosis not present

## 2022-04-08 DIAGNOSIS — G4733 Obstructive sleep apnea (adult) (pediatric): Secondary | ICD-10-CM | POA: Insufficient documentation

## 2022-04-08 DIAGNOSIS — K3189 Other diseases of stomach and duodenum: Secondary | ICD-10-CM | POA: Diagnosis not present

## 2022-04-08 DIAGNOSIS — M1990 Unspecified osteoarthritis, unspecified site: Secondary | ICD-10-CM | POA: Diagnosis not present

## 2022-04-08 DIAGNOSIS — K219 Gastro-esophageal reflux disease without esophagitis: Secondary | ICD-10-CM

## 2022-04-08 DIAGNOSIS — Z6841 Body Mass Index (BMI) 40.0 and over, adult: Secondary | ICD-10-CM | POA: Insufficient documentation

## 2022-04-08 DIAGNOSIS — K317 Polyp of stomach and duodenum: Secondary | ICD-10-CM | POA: Diagnosis not present

## 2022-04-08 DIAGNOSIS — K2970 Gastritis, unspecified, without bleeding: Secondary | ICD-10-CM

## 2022-04-08 DIAGNOSIS — I10 Essential (primary) hypertension: Secondary | ICD-10-CM | POA: Insufficient documentation

## 2022-04-08 DIAGNOSIS — F419 Anxiety disorder, unspecified: Secondary | ICD-10-CM | POA: Diagnosis not present

## 2022-04-08 DIAGNOSIS — R69 Illness, unspecified: Secondary | ICD-10-CM | POA: Diagnosis not present

## 2022-04-08 DIAGNOSIS — I2510 Atherosclerotic heart disease of native coronary artery without angina pectoris: Secondary | ICD-10-CM | POA: Diagnosis not present

## 2022-04-08 DIAGNOSIS — G709 Myoneural disorder, unspecified: Secondary | ICD-10-CM | POA: Insufficient documentation

## 2022-04-08 HISTORY — PX: POLYPECTOMY: SHX5525

## 2022-04-08 HISTORY — PX: ESOPHAGOGASTRODUODENOSCOPY (EGD) WITH PROPOFOL: SHX5813

## 2022-04-08 HISTORY — PX: BIOPSY: SHX5522

## 2022-04-08 SURGERY — ESOPHAGOGASTRODUODENOSCOPY (EGD) WITH PROPOFOL
Anesthesia: Monitor Anesthesia Care

## 2022-04-08 MED ORDER — ONDANSETRON HCL 4 MG/2ML IJ SOLN
INTRAMUSCULAR | Status: DC | PRN
  Administered 2022-04-08: 4 mg via INTRAVENOUS

## 2022-04-08 MED ORDER — SODIUM CHLORIDE 0.9 % IV SOLN: INTRAVENOUS | Status: DC

## 2022-04-08 MED ORDER — PROPOFOL 1000 MG/100ML IV EMUL
INTRAVENOUS | Status: AC
  Filled 2022-04-08: qty 100

## 2022-04-08 MED ORDER — LACTATED RINGERS IV SOLN: INTRAVENOUS | Status: DC

## 2022-04-08 MED ORDER — PROPOFOL 10 MG/ML IV BOLUS
INTRAVENOUS | Status: DC | PRN
  Administered 2022-04-08: 40 mg via INTRAVENOUS

## 2022-04-08 MED ORDER — LIDOCAINE 2% (20 MG/ML) 5 ML SYRINGE
INTRAMUSCULAR | Status: DC | PRN
  Administered 2022-04-08: 100 mg via INTRAVENOUS

## 2022-04-08 MED ORDER — PROPOFOL 500 MG/50ML IV EMUL
INTRAVENOUS | Status: DC | PRN
  Administered 2022-04-08: 125 ug/kg/min via INTRAVENOUS

## 2022-04-08 SURGICAL SUPPLY — 15 items

## 2022-04-08 NOTE — H&P (Signed)
History and Physical:  This patient presents for endoscopic testing for: GERD and Preop evaluation of UGI tract prior to bariatric surgery  Clinical details in 02/26/22 office consult note. Patient reports no clinical changes since then.  Patient is otherwise without complaints or active issues today.   Past Medical History: Past Medical History:  Diagnosis Date   Allergy    dust, pollen, sulfa, prednisone   Anemia    Anxiety    Arthritis    "knees" (04/26/2016)   Colon polyps    benign per pt   Coronary artery disease    mild per 2015 cath in [place] (OM1 30%, RCA 30%)   GERD (gastroesophageal reflux disease)    Gout    History of hiatal hernia    Hyperlipidemia 05/20/2021   Hypertension    Migraine    "none since early /2017" (05/06/2016)   Obesity    OSA on CPAP    uses CPAP     Past Surgical History: Past Surgical History:  Procedure Laterality Date   ABDOMINAL HYSTERECTOMY     "partial"; both ovaries present   APPENDECTOMY  05/06/2016   CARDIAC CATHETERIZATION  ~ 2015   CARDIAC CATHETERIZATION  2015   In [place] Right 06/01/2017   Procedure: [place];  Surgeon: Edrick Kins, DPM;  Location: [place];  Service: Podiatry;  Laterality: Right;   LAPAROSCOPIC APPENDECTOMY N/A 05/06/2016   Procedure: LAPAROSCOPIC APPENDECTOMY;  Surgeon: Donnie Mesa, MD;  Location: [place];  Service: General;  Laterality: N/A;   TONSILLECTOMY      Allergies: Allergies  Allergen Reactions   Prednisone Swelling    Legs swell    Tape Hives   Sulfa Antibiotics Rash    Outpatient Meds: Current Facility-Administered Medications  Medication Dose Route Frequency Provider Last Rate Last Admin   0.9 %  sodium chloride infusion   Intravenous Continuous Danis, Estill Cotta III, MD       lactated ringers infusion   Intravenous Continuous Nelida Meuse III, MD 10 mL/hr at 04/08/22 1000 New Bag at 04/08/22 1000       ___________________________________________________________________ Objective   Exam:  BP (!) 154/71   Pulse 71   Temp (!) 97.5 F (36.4 C) (Temporal)   Resp (!) 23   Ht 5' 1.5" (1.562 m)   Wt 131.1 kg   SpO2 99%   BMI 53.72 kg/m   CV: regular , S1/S2 Resp: clear to auscultation bilaterally, normal RR and effort noted GI: soft, no tenderness, with active bowel sounds.   Assessment: GERD - preop evaluation for consideration of bariatric surgery.   Plan:  EGD  The benefits and risks of the planned procedure were described in detail with the patient or (when appropriate) their health care proxy.  Risks were outlined as including, but not limited to, bleeding, infection, perforation, adverse medication reaction leading to cardiac or pulmonary decompensation, pancreatitis (if ERCP).  The limitation of incomplete mucosal visualization was also discussed.  No guarantees or warranties were given.\   The patient is appropriate for an endoscopic procedure in the ambulatory setting.   - Wilfrid Lund, MD

## 2022-04-08 NOTE — Interval H&P Note (Signed)
History and Physical Interval Note:  04/08/2022 10:56 AM  Christine [name]  has presented today for surgery, with the diagnosis of GERD k21.9.  The various methods of treatment have been discussed with the patient and family. After consideration of risks, benefits and other options for treatment, the patient has consented to  Procedure(s): ESOPHAGOGASTRODUODENOSCOPY (EGD) WITH PROPOFOL (N/A) as a surgical intervention.  The patient's history has been reviewed, patient examined, no change in status, stable for surgery.  I have reviewed the patient's chart and labs.  Questions were answered to the patient's satisfaction.     Nelida Meuse III

## 2022-04-08 NOTE — Transfer of Care (Signed)
Immediate Anesthesia Transfer of Care Note  Patient: Meera Vasco  Procedure(s) Performed: ESOPHAGOGASTRODUODENOSCOPY (EGD) WITH PROPOFOL POLYPECTOMY BIOPSY  Patient Location: Endoscopy Unit  Anesthesia Type:MAC  Level of Consciousness: awake, alert , and oriented  Airway & Oxygen Therapy: Patient Spontanous Breathing and Patient connected to face mask oxygen  Post-op Assessment: Report given to RN and Post -op Vital signs reviewed and stable  Post vital signs: Reviewed and stable  Last Vitals:  Vitals Value Taken Time  BP 154/71 04/08/22 1130  Temp 36.4 C 04/08/22 1130  Pulse    Resp 22 04/08/22 1131  SpO2    Vitals shown include unvalidated device data.  Last Pain:  Vitals:   04/08/22 1130  TempSrc: Temporal  PainSc:          Complications: No notable events documented.

## 2022-04-08 NOTE — Op Note (Signed)
[facility] Patient Name: Christine [name] Procedure Date: 04/08/2022 MRN: [id number] Attending MD: Estill Cotta. Loletha Carrow , MD, [id number] Date of Birth: Aug 29, 1950 CSN: [id number] Age: 71 Admit Type: Outpatient Procedure:                Upper GI endoscopy Indications:              Esophageal reflux symptoms that persist despite                            appropriate therapy, Preoperative assessment for                            bariatric surgery to treat morbid obesity                           (surgeon also requests Bx to r/o H pylori) Providers:                Estill Cotta. Loletha Carrow, MD, Benay Pillow, RN, Brien Mates, Technician Referring MD:             Nickola Major [facility]) Medicines:                Monitored Anesthesia Care Complications:            No immediate complications. Estimated Blood Loss:     Estimated blood loss was minimal. Procedure:                Pre-Anesthesia Assessment:                           - Prior to the procedure, a History and Physical                            was performed, and patient medications and                            allergies were reviewed. The patient's tolerance of                            previous anesthesia was also reviewed. The risks                            and benefits of the procedure and the sedation                            options and risks were discussed with the patient.                            All questions were answered, and informed consent                            was obtained. Prior Anticoagulants: The patient has  taken no anticoagulant or antiplatelet agents. ASA                            Grade Assessment: IV - A patient with severe                            systemic disease that is a constant threat to life.                            After reviewing the risks and benefits, the patient                            was  deemed in satisfactory condition to undergo the                            procedure.                           After obtaining informed consent, the endoscope was                            passed under direct vision. Throughout the                            procedure, the patient's blood pressure, pulse, and                            oxygen saturations were monitored continuously. The                            [id number]-H190 ([id number]) Olympus endoscope was introduced                            through the mouth, and advanced to the second part                            of duodenum. The upper GI endoscopy was                            accomplished without difficulty. The patient                            tolerated the procedure well. Scope In: Scope Out: Findings:      There is no endoscopic evidence of Barrett's esophagus, esophagitis,       hiatal hernia or stricture in the entire esophagus.      Striped mildly erythematous mucosa was found in the gastric antrum.       Several biopsies were obtained on the greater curvature of the gastric       body, on the lesser curvature of the gastric body, on the greater       curvature of the gastric antrum and on the lesser curvature of the       gastric antrum with cold forceps for histology.      Multiple small sessile fundic gland  polyps were found in the gastric       fundus and in the gastric body.      A single 5 mm sessile polyp was found in the gastric fundus (different       appearance that the FGP under [facility] and [facility]). The polyp was removed with a       piecemeal technique using a cold biopsy forceps. Resection and retrieval       were complete.      The exam of the stomach was otherwise normal.      The cardia and gastric fundus were normal on retroflexion.      The examined duodenum was normal. Impression:               - Erythematous mucosa in the antrum.                           - Multiple fundic gland polyps.                            - A single gastric polyp. Resected and retrieved.                            (probably hyperplastic, removed to r/o adenoma)                           - Normal examined duodenum.                           - Several biopsies were obtained on the greater                            curvature of the gastric body, on the lesser                            curvature of the gastric body, on the greater                            curvature of the gastric antrum and on the lesser                            curvature of the gastric antrum. Moderate Sedation:      MAC sedation used Recommendation:           - Patient has a contact number available for                            emergencies. The signs and symptoms of potential                            delayed complications were discussed with the                            patient. Return to normal activities tomorrow.                            Written discharge instructions were provided  to the                            patient.                           - Resume previous diet.                           - Continue present medications.                           - Await pathology results. Procedure Code(s):        --- Professional ---                           [phone], Esophagogastroduodenoscopy, flexible,                            transoral; with biopsy, single or multiple Diagnosis Code(s):        --- Professional ---                           K31.89, Other diseases of stomach and duodenum                           K31.7, Polyp of stomach and duodenum                           K21.9, Gastro-esophageal reflux disease without                            esophagitis                           Z01.818, Encounter for other preprocedural                            examination                           E66.01, Morbid (severe) obesity due to excess                            calories CPT copyright 2022 American Medical Association. All rights  reserved. The codes documented in this report are preliminary and upon coder review may  be revised to meet current compliance requirements. Pritika Alvarez L. Loletha Carrow, MD 04/08/2022 11:34:01 AM This report has been signed electronically. Number of Addenda: 0

## 2022-04-08 NOTE — Anesthesia Preprocedure Evaluation (Signed)
Anesthesia Evaluation  Patient identified by MRN, date of birth, ID band Patient awake    Reviewed: Allergy & Precautions, NPO status , Patient's Chart, lab work & pertinent test results  History of Anesthesia Complications Negative for: history of anesthetic complications  Airway Mallampati: III  TM Distance: <3 FB Neck ROM: Full    Dental  (+) Teeth Intact, Dental Advisory Given   Pulmonary neg shortness of breath, sleep apnea and Continuous Positive Airway Pressure Ventilation , neg COPD, neg recent [name], former smoker   breath sounds clear to auscultation       Cardiovascular hypertension, Pt. on medications and Pt. on home beta blockers (-) angina + CAD  (-) Cardiac Stents, (-) CABG and (-) CHF  Rhythm:Regular Rate:Normal     Neuro/Psych  Headaches PSYCHIATRIC DISORDERS Anxiety      Neuromuscular disease    GI/Hepatic Neg liver ROS, hiatal hernia,GERD  Medicated and Controlled,,  Endo/Other    Morbid obesity  Renal/GU negative Renal ROS     Musculoskeletal  (+) Arthritis ,    Abdominal  (+) + obese  Peds  Hematology  (+) Blood dyscrasia, anemia   Anesthesia Other Findings PMH includes:  CAD (mild per 2015 cath), HTN, OSA, anemia, pre-diabetes. Former smoker. BMI 56. S/p appendectomy 05/06/16.   Medications include: Allegra-D, iron, metoprolol, Protonix, Maxzide    Preoperative labs reviewed.   - HbA1c 6.3, glucose   EKG 05/30/17: NSR.   Cardiac cath 03/04/14 (Care everywhere):  - LM: smooth and free of significant disease - LAD: minimal luminal irregularities throughout. D1 is medium caliber vessel which is smooth and has minimal luminal irregularities.The remainder of the diagonal vessels are small in caliber. - LCX: minimal luminal irregularities. OM1 proximal 30% eccentric narrowing. OM2 is small.  OM3 has minimal luminal irregularities.  - RCA: proximal eccentric 30%  narrowing RECOMMENDATIONS:  1.Medical therapy and risk factor modification with focus on blood pressure control.    Reproductive/Obstetrics                             Anesthesia Physical Anesthesia Plan  ASA: III  Anesthesia Plan: MAC   Post-op Pain Management: Minimal or no pain anticipated   Induction: Intravenous  PONV Risk Score and Plan: 2 and Ondansetron, Propofol infusion and Treatment may vary due to age or medical condition  Airway Management Planned: Nasal Cannula  Additional Equipment: None  Intra-op Plan:   Post-operative Plan:   Informed Consent: I have reviewed the patients History and Physical, chart, labs and discussed the procedure including the risks, benefits and alternatives for the proposed anesthesia with the patient or authorized representative who has indicated his/her understanding and acceptance.     Dental advisory given  Plan Discussed with: CRNA and Surgeon  Anesthesia Plan Comments:         Anesthesia Quick Evaluation

## 2022-04-08 NOTE — Anesthesia Procedure Notes (Signed)
Procedure Name: MAC Date/Time: 04/08/2022 11:10 AM  Performed by: Maxwell Caul, CRNAPre-anesthesia Checklist: Patient identified, Emergency Drugs available, Suction available and Patient being monitored Oxygen Delivery Method: Simple face mask

## 2022-04-09 LAB — SURGICAL PATHOLOGY

## 2022-04-09 NOTE — Anesthesia Postprocedure Evaluation (Signed)
Anesthesia Post Note  Patient: Christine [name]  Procedure(s) Performed: ESOPHAGOGASTRODUODENOSCOPY (EGD) WITH PROPOFOL POLYPECTOMY BIOPSY     Patient location during evaluation: Endoscopy Anesthesia Type: MAC Level of consciousness: awake and alert Pain management: pain level controlled Vital Signs Assessment: post-procedure vital signs reviewed and stable Respiratory status: spontaneous breathing, nonlabored ventilation and respiratory function stable Cardiovascular status: stable and blood pressure returned to baseline Postop Assessment: no apparent nausea or vomiting Anesthetic complications: no  No notable events documented.  Last Vitals:  Vitals:   04/08/22 1145 04/08/22 1150  BP: (!) 145/79 (!) 148/83  Pulse: 66 70  Resp: 16 (!) 22  Temp:    SpO2: 98% 100%    Last Pain:  Vitals:   04/08/22 1150  TempSrc:   PainSc: 0-No pain                 Britlyn Martine

## 2022-04-11 ENCOUNTER — Encounter (HOSPITAL_COMMUNITY): Payer: Self-pay | Admitting: Gastroenterology

## 2022-04-14 ENCOUNTER — Ambulatory Visit: Payer: Medicare HMO | Admitting: Podiatry

## 2022-04-20 ENCOUNTER — Encounter: Payer: Self-pay | Admitting: Family Medicine

## 2022-04-22 ENCOUNTER — Encounter: Payer: Medicare HMO | Attending: Surgery | Admitting: Dietician

## 2022-04-22 ENCOUNTER — Encounter: Payer: Self-pay | Admitting: Dietician

## 2022-04-22 VITALS — Ht 61.5 in | Wt 298.1 lb

## 2022-04-22 DIAGNOSIS — Z6841 Body Mass Index (BMI) 40.0 and over, adult: Secondary | ICD-10-CM | POA: Diagnosis not present

## 2022-04-22 DIAGNOSIS — E669 Obesity, unspecified: Secondary | ICD-10-CM

## 2022-04-22 DIAGNOSIS — Z713 Dietary counseling and surveillance: Secondary | ICD-10-CM | POA: Insufficient documentation

## 2022-04-22 NOTE — Progress Notes (Signed)
Supervised Weight Loss Visit Bariatric Nutrition Education Appt Start Time:   11:55  End Time: 12:29  3 out of 6 SWL Appointments   Planned surgery: Sleeve Gastrectomy   NUTRITION ASSESSMENT   Anthropometrics  Start weight at [facility]: 311.2 lbs (date: 01/21/2022) Height: 61.5 in Weight today: 298.1 lbs. BMI: 55.41 kg/m2     Clinical  Medical hx: Anemia, anxiety, arthritis, GERD, Hyperlipidemia, HTN, sleep apnea Medications: Esomeprazole, rosuvastatin, duloxetine, ibuprofen, metoprolol tartrate, oxybutynin, triamterene HCTZ  Labs: A1C 6.4; glucose 106; hemoglobin 10.6; hematocrit 33.2 Notable signs/symptoms: none noted Any previous deficiencies? No  Lifestyle & Dietary Hx  Pt states she went out with a friend for lunch on Christmas day, stating they went to a [place]. Pt states she has a Cirkul bottle, stating her favorite flavor is sweet tea with zero sugar. Pt states she is practicing not drinking with her meals. Pt states most days she does pretty good eating throughout the day, but may miss breakfast, stating she is now setting her alarm clock to wake a half hour later, now at 8:30.  Pt states this helps her to make sure she gets breakfast, and get along with her day, getting more done .  Estimated daily fluid intake: fluctuating (mostly water) 33 oz. Supplements: Iron, fish oil Current average weekly physical activity: ADLs, stationary bike, 3 days a week for 20 minutes  24-Hr Dietary Recall First Meal: raisin bran crunch with blueberries Snack: pear Second Meal: protein snack/tray Snack: granola bar Third Meal: snow crab, pickled beets, edamame Snack: fruit Beverages: water, Cirkul water  Estimated Energy Needs Calories: 1500  NUTRITION DIAGNOSIS  Overweight/obesity ([place]-3.3) related to past poor dietary habits and physical inactivity as evidenced by patient w/ planned sleeve surgery following dietary guidelines for continued weight loss.  NUTRITION  INTERVENTION  Nutrition counseling (C-1) and education (E-2) to facilitate bariatric surgery goals.  Physical activity is an important part of a healthy lifestyle so keep it moving! The goal is to reach 150 minutes of exercise per week, including cardiovascular and weight baring activity. Purpose of hydration: Water makes up over 50% of your total body water, and is part of many organs throughout the body. Water is essential to transport digested nutrients, regulate body temperature, rid the body of waste products, and protects joints and the spinal cord. When not properly hydrated you will begin to experience headaches, cramps and dizziness. Further dehydration can result in rapid heart rate, shock, oliguria, and may cause seizures.  https://www.merckmanuals.com/home/hormonal-and-metabolic-disordehttps://www.usgs.gov/special-topic/water-science-school/science/water-you-water-and-human-body?qt-science_center_objects=0#qt-science_center_objectsrs/water-balance/about-body-water HistoricalGrowth.gl https://www.stevens.org/ PimpTShirt.fi https://www.health.InvestmentBrowse.at  Pre-Op Goals Progress & New Goals Continue: create routine/consistency, by having a bed time and wake up time. Eat within 1.5 hours of waking up in the morning. Continue: avoid all sugar-sweetened beverages (ex: regular soda, sports drinks)  Continue: avoid all carbonated beverages (ex: soda, sparkling beverages)  Continue: Consume 3 meals per day or try to eat every 3-5 hours Re-engage/continue: Practice not drinking 15 minutes before, during, and 30 minutes after each meal and snack New: focus on physical activity, increasing to 4 days a week. New: increase fluid intake, aiming for 44 oz or greater.  Handouts Provided Include  Benefits  of physical activity handout  Learning Style & Readiness for Change Teaching method utilized: Visual & Auditory  Demonstrated degree of understanding via: Teach Back  Readiness Level: contemplative  Barriers to learning/adherence to lifestyle change: mobility and previous habits  RD's Notes for next Visit  Progress toward patient's chosen goals  MONITORING & EVALUATION Dietary intake, weekly physical activity,  body weight, and pre-op goals in 1 month.   Next Steps  Patient is to return to [facility] in one month for next SWL visit.

## 2022-04-27 ENCOUNTER — Ambulatory Visit: Payer: Medicare HMO | Admitting: Podiatry

## 2022-04-27 DIAGNOSIS — M79675 Pain in left toe(s): Secondary | ICD-10-CM | POA: Diagnosis not present

## 2022-04-27 DIAGNOSIS — B351 Tinea unguium: Secondary | ICD-10-CM

## 2022-04-27 DIAGNOSIS — M79674 Pain in right toe(s): Secondary | ICD-10-CM | POA: Diagnosis not present

## 2022-04-27 NOTE — Progress Notes (Signed)
   SUBJECTIVE Patient presents to office today complaining of elongated, thickened nails that cause pain while ambulating in shoes.  Patient is unable to trim their own nails. Patient is here for further evaluation and treatment.  Past Medical History:  Diagnosis Date   Allergy    dust, pollen, sulfa, prednisone   Anemia    Anxiety    Arthritis    "knees" (04/26/2016)   Colon polyps    benign per pt   Coronary artery disease    mild per 2015 cath in [place] (OM1 30%, RCA 30%)   GERD (gastroesophageal reflux disease)    Gout    History of hiatal hernia    Hyperlipidemia 05/20/2021   Hypertension    Migraine    "none since early /2017" (05/06/2016)   Obesity    OSA on CPAP    uses CPAP    OBJECTIVE General Patient is awake, alert, and oriented x 3 and in no acute distress. Derm Skin is dry and supple bilateral. Negative open lesions or macerations. Remaining integument unremarkable. Nails are tender, long, thickened and dystrophic with subungual debris, consistent with onychomycosis, 1-5 bilateral. No signs of infection noted. Vasc  DP and PT pedal pulses palpable bilaterally. Temperature gradient within normal limits.  Neuro Epicritic and protective threshold sensation grossly intact bilaterally.  Musculoskeletal Exam No symptomatic pedal deformities noted bilateral. Muscular strength within normal limits.  ASSESSMENT 1.  Pain due to onychomycosis of toenails both  PLAN OF CARE 1. Patient evaluated today.  2. Instructed to maintain good pedal hygiene and foot care.  3. Mechanical debridement of nails 1-5 bilaterally performed using a nail nipper. Filed with dremel without incident.  4. Return to clinic in 3 mos.    Edrick Kins, DPM [facility]  Dr. Edrick Kins, DPM    [address], [place] [address]                Office [phone]  Fax [phone]

## 2022-05-02 DIAGNOSIS — G4733 Obstructive sleep apnea (adult) (pediatric): Secondary | ICD-10-CM | POA: Diagnosis not present

## 2022-05-17 ENCOUNTER — Ambulatory Visit: Payer: Medicare HMO | Admitting: Family Medicine

## 2022-05-17 ENCOUNTER — Encounter: Payer: Self-pay | Admitting: Family Medicine

## 2022-05-17 VITALS — BP 142/84 | Ht 61.5 in | Wt 293.0 lb

## 2022-05-17 DIAGNOSIS — M1711 Unilateral primary osteoarthritis, right knee: Secondary | ICD-10-CM

## 2022-05-17 NOTE — Progress Notes (Signed)
  Christine [name] - 72 y.o. female MRN [id number]  Date of birth: 1950-09-11  SUBJECTIVE:  Including CC & ROS.  No chief complaint on file.   Christine [name] is a 72 y.o. female that is  following up for her right knee pain. Not having pain today. Has been doing well. She is still in the process of going through gastric bypass surgery.    Review of Systems See HPI   HISTORY: Past Medical, Surgical, Social, and Family History Reviewed & Updated per EMR.   Pertinent Historical Findings include:  Past Medical History:  Diagnosis Date   Allergy    dust, pollen, sulfa, prednisone   Anemia    Anxiety    Arthritis    "knees" (04/26/2016)   Colon polyps    benign per pt   Coronary artery disease    mild per 2015 cath in [place] (OM1 30%, RCA 30%)   GERD (gastroesophageal reflux disease)    Gout    History of hiatal hernia    Hyperlipidemia 05/20/2021   Hypertension    Migraine    "none since early /2017" (05/06/2016)   Obesity    OSA on CPAP    uses CPAP    Past Surgical History:  Procedure Laterality Date   ABDOMINAL HYSTERECTOMY     "partial"; both ovaries present   APPENDECTOMY  05/06/2016   BIOPSY  04/08/2022   Procedure: BIOPSY;  Surgeon: Doran Stabler, MD;  Location: [facility];  Service: Gastroenterology;;   CARDIAC CATHETERIZATION  ~ 2015   CARDIAC CATHETERIZATION  2015   In [place] Right 06/01/2017   Procedure: CHILECTOMY RIGHT FOOT;  Surgeon: Edrick Kins, DPM;  Location: [facility];  Service: Podiatry;  Laterality: Right;   ESOPHAGOGASTRODUODENOSCOPY (EGD) WITH PROPOFOL N/A 04/08/2022   Procedure: ESOPHAGOGASTRODUODENOSCOPY (EGD) WITH PROPOFOL;  Surgeon: Doran Stabler, MD;  Location: [facility];  Service: Gastroenterology;  Laterality: N/A;   LAPAROSCOPIC APPENDECTOMY N/A 05/06/2016   Procedure: LAPAROSCOPIC APPENDECTOMY;  Surgeon: Donnie Mesa, MD;  Location: [facility];  Service: General;  Laterality: N/A;   POLYPECTOMY  04/08/2022    Procedure: POLYPECTOMY;  Surgeon: Doran Stabler, MD;  Location: [facility];  Service: Gastroenterology;;   TONSILLECTOMY       PHYSICAL EXAM:  VS: BP (!) 142/84   Ht 5' 1.5" (1.562 m)   Wt 293 lb (132.9 kg)   BMI 54.47 kg/m  Physical Exam Gen: NAD, alert, cooperative with exam, well-appearing MSK:  Neurovascularly intact       ASSESSMENT & PLAN:   OA (osteoarthritis) of knee Doing well currently with no significant pain.  She is still going through the process for her gastric bypass surgery.  Has done well since the Zilretta injection. -Counseled on home exercise therapy and supportive care. -Could consider gel injection or Zilretta going forward.

## 2022-05-17 NOTE — Patient Instructions (Signed)
Good to see you Please continue strength training  Please use ice as needed   Please let us know if your pain returns and we can order the zilretta Please send me a message in [place] with any questions or updates.  Please see me back as needed.   --Dr. Raeford Razor

## 2022-05-17 NOTE — Assessment & Plan Note (Signed)
Doing well currently with no significant pain.  She is still going through the process for her gastric bypass surgery.  Has done well since the Zilretta injection. -Counseled on home exercise therapy and supportive care. -Could consider gel injection or Zilretta going forward.

## 2022-05-20 ENCOUNTER — Encounter: Payer: Self-pay | Admitting: Dietician

## 2022-05-20 ENCOUNTER — Encounter: Payer: Medicare HMO | Attending: Surgery | Admitting: Dietician

## 2022-05-20 VITALS — Ht 61.5 in | Wt 301.1 lb

## 2022-05-20 DIAGNOSIS — Z6841 Body Mass Index (BMI) 40.0 and over, adult: Secondary | ICD-10-CM | POA: Diagnosis not present

## 2022-05-20 DIAGNOSIS — E669 Obesity, unspecified: Secondary | ICD-10-CM | POA: Diagnosis present

## 2022-05-20 DIAGNOSIS — Z713 Dietary counseling and surveillance: Secondary | ICD-10-CM | POA: Insufficient documentation

## 2022-05-20 NOTE — Progress Notes (Signed)
Supervised Weight Loss Visit Bariatric Nutrition Education  4 out of 6 SWL Appointments   Planned surgery: Sleeve Gastrectomy   NUTRITION ASSESSMENT   Anthropometrics  Start weight at [facility]: 311.2 lbs (date: 01/21/2022) Height: 61.5 in Weight today: 301.1 lbs. BMI: 55.97 kg/m2     Clinical  Medical hx: Anemia, anxiety, arthritis, GERD, Hyperlipidemia, HTN, sleep apnea Medications: Esomeprazole, rosuvastatin, duloxetine, ibuprofen, metoprolol tartrate, oxybutynin, triamterene HCTZ  Labs: A1C 6.4; glucose 106; hemoglobin 10.6; hematocrit 33.2 Notable signs/symptoms: none noted Any previous deficiencies? No  Lifestyle & Dietary Hx  Pt states she had a rebellion, stating she didn't like the idea of getting up with an alarm clock, stating she retired so she did not have to get up in the morning. Pt states she has mixed results for physical activity, stating she tried to increase the bike riding, but states she had a pain in her leg. Pt states she is now doing chair yoga with the [employer].  Pt states it is a good workout. Pt states she had a reaction to a soda, stating she had a sugar rush. Pt states she does not want that to happen again, stating she is giving it up.  Estimated daily fluid intake: fluctuating (mostly water) 50 oz (2.25 of Cirkul bottle) Supplements: Iron, fish oil Current average weekly physical activity: ADLs, chair yoga ([employer]), 3 times a week, 30-40 minutes.  24-Hr Dietary Recall First Meal: sausage sandwich (whole wheat) with fruit Snack: banana Second Meal: protein snack/tray or blue crabs Snack: pear Third Meal: chef salad Snack: sf chocolate pudding cup and some mixed fruit Beverages: water, Cirkul water, sweet tea (Splenda), soda  Estimated Energy Needs Calories: 1500  NUTRITION DIAGNOSIS  Overweight/obesity ([place]-3.3) related to past poor dietary habits and physical inactivity as evidenced by patient w/ planned sleeve  surgery following dietary guidelines for continued weight loss.  NUTRITION INTERVENTION  Nutrition counseling (C-1) and education (E-2) to facilitate bariatric surgery goals.  Physical activity is an important part of a healthy lifestyle so keep it moving! The goal is to reach 150 minutes of exercise per week, including cardiovascular and weight baring activity. Purpose of protein: Every cell in your body has protein. Protein is essential for the structure, function and regulation of tissues and organs within the body. Without protein enzymes and antibodies would not exist, and cells would lack storage, transportation, and messenger systems. According to [facility]. [place], the body is made up of at least 10000 different proteins. Lack of protein can lead to growth failure in children, loss of muscle mass, decreased immune system function, and overall weakening of various organs in the body.  GenevaBlog.dk, EliteClients.be, VentureZip.tn   Pre-Op Goals Progress & New Goals Continue: create routine/consistency, by having a bed time and wake up time. Eat within 1.5 hours of waking up in the morning. Continue: avoid all sugar-sweetened beverages (ex: regular soda, sports drinks)  Continue: avoid all carbonated beverages (ex: soda, sparkling beverages)  Continue: Consume 3 meals per day or try to eat every 3-5 hours Re-engage/continue: Practice not drinking 15 minutes before, during, and 30 minutes after each meal and snack Re-engage: focus on physical activity, increasing chair yoga to 4 days a week. Continue: increase fluid intake, aiming for 44 oz or greater. New: choose lean proteins, to include plant based proteins (ie beans, edamame, quinoa)  Handouts Provided Include    Learning Style & Readiness for Change Teaching method utilized: Visual &  Auditory  Demonstrated degree of understanding via: Teach Back  Readiness Level: contemplative  Barriers to learning/adherence to lifestyle change: mobility and previous habits  RD's Notes for next Visit  Progress toward patient's chosen goals  MONITORING & EVALUATION Dietary intake, weekly physical activity, body weight, and pre-op goals in 1 month.   Next Steps  Patient is to return to [facility] in one month for next SWL visit.

## 2022-05-21 ENCOUNTER — Encounter: Payer: Self-pay | Admitting: Family Medicine

## 2022-05-21 ENCOUNTER — Other Ambulatory Visit (HOSPITAL_BASED_OUTPATIENT_CLINIC_OR_DEPARTMENT_OTHER): Payer: Self-pay | Admitting: Family Medicine

## 2022-05-21 ENCOUNTER — Ambulatory Visit (INDEPENDENT_AMBULATORY_CARE_PROVIDER_SITE_OTHER): Payer: Medicare HMO | Admitting: Family Medicine

## 2022-05-21 VITALS — BP 120/70 | HR 91 | Temp 97.9°F | Ht 61.0 in | Wt 299.4 lb

## 2022-05-21 DIAGNOSIS — I10 Essential (primary) hypertension: Secondary | ICD-10-CM

## 2022-05-21 DIAGNOSIS — N3946 Mixed incontinence: Secondary | ICD-10-CM | POA: Diagnosis not present

## 2022-05-21 DIAGNOSIS — R69 Illness, unspecified: Secondary | ICD-10-CM | POA: Diagnosis not present

## 2022-05-21 DIAGNOSIS — E785 Hyperlipidemia, unspecified: Secondary | ICD-10-CM

## 2022-05-21 DIAGNOSIS — F411 Generalized anxiety disorder: Secondary | ICD-10-CM | POA: Diagnosis not present

## 2022-05-21 DIAGNOSIS — Z1231 Encounter for screening mammogram for malignant neoplasm of breast: Secondary | ICD-10-CM

## 2022-05-21 LAB — COMPREHENSIVE METABOLIC PANEL
ALT: 14 U/L (ref 0–35)
AST: 15 U/L (ref 0–37)
Albumin: 4 g/dL (ref 3.5–5.2)
Alkaline Phosphatase: 87 U/L (ref 39–117)
BUN: 36 mg/dL — ABNORMAL HIGH (ref 6–23)
CO2: 25 mEq/L (ref 19–32)
Calcium: 9.7 mg/dL (ref 8.4–10.5)
Chloride: 106 mEq/L (ref 96–112)
Creatinine, Ser: 0.88 mg/dL (ref 0.40–1.20)
GFR: 67.16 mL/min (ref >60.00)
Glucose, Bld: 88 mg/dL (ref 70–99)
Potassium: 4 mEq/L (ref 3.5–5.1)
Sodium: 141 mEq/L (ref 135–145)
Total Bilirubin: 0.6 mg/dL (ref 0.2–1.2)
Total Protein: 7 g/dL (ref 6.0–8.3)

## 2022-05-21 LAB — LIPID PANEL
Cholesterol: 201 mg/dL — ABNORMAL HIGH (ref 0–200)
HDL: 89.5 mg/dL (ref >39.00)
LDL Cholesterol: 96 mg/dL (ref 0–99)
NonHDL: 111.44
Total CHOL/HDL Ratio: 2
Triglycerides: 78 mg/dL (ref 0.0–149.0)
VLDL: 15.6 mg/dL (ref 0.0–40.0)

## 2022-05-21 MED ORDER — METOPROLOL SUCCINATE ER 50 MG PO TB24: 50.0000 mg | ORAL_TABLET | Freq: Every day | ORAL | 3 refills | Status: DC

## 2022-05-21 NOTE — Progress Notes (Signed)
Chief Complaint  Patient presents with   Follow-up    6 month    Subjective Christine [name] is a 72 y.o. female who presents for hypertension follow up. She does not monitor home blood pressures. She is compliant with medications Maxzide 75-50 mg/d, Lopressor 50 mg/d. Patient has these side effects of medication: none She is sometimes adhering to a healthy diet overall. Current exercise: Cycling, chair yoga No Cp or SOB.  Hyperlipidemia Patient presents for dyslipidemia follow up. Currently being treated with Crestor 10 mg/d and compliance with treatment thus far has been good. She denies myalgias. Diet/exercise as above.  The patient is not known to have coexisting coronary artery disease.  GAD Controlled on Cymbalta 60 mg/d. Reports compliance, no AE's. Things have been rough as her 70 yo dog passed away. No HI or SI. No self medication. Not following w a counselor/therapist.   Incontinence Hx of mixed incontinence. Taking Ditropan XL 15 mg/d. Reports compliance, no AE's. S/s's controlled currently. No constipation or dry mouth.    Past Medical History:  Diagnosis Date   Allergy    dust, pollen, sulfa, prednisone   Anemia    Anxiety    Arthritis    "knees" (04/26/2016)   Colon polyps    benign per pt   Coronary artery disease    mild per 2015 cath in [place] (OM1 30%, RCA 30%)   GERD (gastroesophageal reflux disease)    Gout    History of hiatal hernia    Hyperlipidemia 05/20/2021   Hypertension    Migraine    "none since early /2017" (05/06/2016)   Obesity    OSA on CPAP    uses CPAP    Exam BP 120/70 (BP Location: Left Arm, Patient Position: Sitting, Cuff Size: Normal)   Pulse 91   Temp 97.9 F (36.6 C) (Oral)   Ht '5\' 1"'$  (1.549 m)   Wt 299 lb 6 oz (135.8 kg)   SpO2 99%   BMI 56.57 kg/m  General:  well developed, well nourished, in no apparent distress Heart: RRR, no bruits, 1+ pitting b/l LE edema tapering at mid tibia Lungs: clear to auscultation, no  accessory muscle use Psych: well oriented with normal range of affect and appropriate judgment/insight  Essential hypertension  Hyperlipidemia, unspecified hyperlipidemia type - Plan: Comprehensive metabolic panel, Lipid panel  GAD (generalized anxiety disorder)  Mixed incontinence urge and stress  Chronic, stable.  Continue Toprol-XL 50 mg daily, Maxide 75 to 50 mg daily.  Counseled on diet and exercise. Chronic, stable.  Continue Crestor 10 mg daily. Chronic, stable.  Continue Cymbalta 60 mg daily. Chronic, stable.  Continue oxybutynin XL 15 mg daily. F/u in 6 months for physical. The patient voiced understanding and agreement to the plan.  [place], DO 05/21/22  9:48 AM

## 2022-05-21 NOTE — Patient Instructions (Signed)
Give us 2-3 business days to get the results of your labs back.   Keep the diet clean and stay active.  Let us know if you need anything. 

## 2022-05-25 ENCOUNTER — Encounter (HOSPITAL_BASED_OUTPATIENT_CLINIC_OR_DEPARTMENT_OTHER): Payer: Self-pay

## 2022-05-25 ENCOUNTER — Ambulatory Visit (HOSPITAL_BASED_OUTPATIENT_CLINIC_OR_DEPARTMENT_OTHER)
Admission: RE | Admit: 2022-05-25 | Discharge: 2022-05-25 | Disposition: A | Discharge status: Home / Self Care | Payer: Medicare HMO | Source: Ambulatory Visit | Attending: Family Medicine | Admitting: Family Medicine

## 2022-05-25 DIAGNOSIS — Z1231 Encounter for screening mammogram for malignant neoplasm of breast: Secondary | ICD-10-CM | POA: Insufficient documentation

## 2022-06-02 DIAGNOSIS — G4733 Obstructive sleep apnea (adult) (pediatric): Secondary | ICD-10-CM | POA: Diagnosis not present

## 2022-06-07 ENCOUNTER — Telehealth: Payer: Self-pay | Admitting: *Deleted

## 2022-06-07 NOTE — Telephone Encounter (Signed)
-----   Message from Elberta Spaniel sent at 06/07/2022 11:45 AM EST ----- Regarding: Pt states Rt knee Zilretta wore off.. pt wants another Pt cld to request Rt knee Zilretta-- I advised pt's of the process & that Precert & ordering is needed before scheduling appt.She had her last one 03/16/22.  --Please start the process.  Thxs

## 2022-06-07 NOTE — Telephone Encounter (Signed)
Benefits checked on [employer].

## 2022-06-09 DIAGNOSIS — H05251 Intermittent exophthalmos, right eye: Secondary | ICD-10-CM | POA: Insufficient documentation

## 2022-06-09 DIAGNOSIS — H25813 Combined forms of age-related cataract, bilateral: Secondary | ICD-10-CM | POA: Diagnosis not present

## 2022-06-10 NOTE — Telephone Encounter (Signed)
Patient may be eligible to obtain Zilretta through [place]. Pt's plan requires PA. [employer] PA form completed and faxed to [employer].  If patient wants to utilize [facility], we must inform [employer] of PA approval and they will arrange Zilretta payment/shipment.

## 2022-06-12 ENCOUNTER — Other Ambulatory Visit: Payer: Self-pay | Admitting: Family Medicine

## 2022-06-14 MED ORDER — IBUPROFEN 800 MG PO TABS: 800.0000 mg | ORAL_TABLET | Freq: Three times a day (TID) | ORAL | 0 refills | Status: DC | PRN

## 2022-06-15 ENCOUNTER — Other Ambulatory Visit (HOSPITAL_BASED_OUTPATIENT_CLINIC_OR_DEPARTMENT_OTHER): Payer: Self-pay

## 2022-06-15 NOTE — Telephone Encounter (Signed)
Zilretta 32 mg is denied because medical studies have not proved that this drug frequency is safe and helpful for treatment.   I called [facility] to have them run a test claim and pharmacy states her plan will not cover Zilretta as a prescription either. As it is non formulary/ non preferred.

## 2022-06-17 ENCOUNTER — Encounter: Payer: Medicare HMO | Attending: Surgery | Admitting: Dietician

## 2022-06-17 ENCOUNTER — Encounter: Payer: Self-pay | Admitting: Dietician

## 2022-06-17 VITALS — Ht 61.5 in | Wt 303.7 lb

## 2022-06-17 DIAGNOSIS — Z713 Dietary counseling and surveillance: Secondary | ICD-10-CM | POA: Diagnosis not present

## 2022-06-17 DIAGNOSIS — E669 Obesity, unspecified: Secondary | ICD-10-CM

## 2022-06-17 NOTE — Progress Notes (Signed)
Supervised Weight Loss Visit Bariatric Nutrition Education  5 out of 6 SWL Appointments   Planned surgery: Sleeve Gastrectomy   NUTRITION ASSESSMENT   Anthropometrics  Start weight at [facility]: 311.2 lbs (date: 01/21/2022) Height: 61.5 in Weight today: 303.7 lbs. BMI: 56.45 kg/m2     Clinical  Medical hx: Anemia, anxiety, arthritis, GERD, Hyperlipidemia, HTN, sleep apnea Medications: Esomeprazole, rosuvastatin, duloxetine, ibuprofen, metoprolol tartrate, oxybutynin, triamterene HCTZ  Labs: A1C 6.4; glucose 106; hemoglobin 10.6; hematocrit 33.2 Notable signs/symptoms: none noted Any previous deficiencies? No  Lifestyle & Dietary Hx  Pt states the past couple of weeks have been tough, stating she is on pain killers for dental issues.  Pt states she has to have a tooth pulled. Pt states she cut out all soda, and walked past the sugar cookies at the grocery store. Pt states she found a sugar free pop cycle for a treat. Pt states is is still doing chair yogo, but not as much, due to pain in knee when shot wore off. Pt states she has made a schedule for meals and snack, stating she is posting on the refrigerator.  Estimated daily fluid intake: fluctuating (mostly water) 66 oz (3 of Cirkul bottle) Supplements: Iron, fish oil Current average weekly physical activity: ADLs, chair yoga (silver sneakers website), 3 times a week, 30-40 minutes.  24-Hr Dietary Recall First Meal: sausage sandwich (whole wheat) with fruit or scrambled egg, biscuit, sausage gravy Snack: banana or granola bar Second Meal: protein snack/tray or blue crabs or chef salad Snack: pear or muffin made with unsweetened applesauce, oats and quinoa (no fat or sugar) Third Meal: chef salad or pork chop, string beans, potato salad Snack: sf chocolate pudding cup and some mixed fruit Beverages: water, Cirkul water, sweet tea (Splenda)  Estimated Energy Needs Calories: 1500  NUTRITION DIAGNOSIS  Overweight/obesity  ([place]-3.3) related to past poor dietary habits and physical inactivity as evidenced by patient w/ planned sleeve surgery following dietary guidelines for continued weight loss.  NUTRITION INTERVENTION  Nutrition counseling (C-1) and education (E-2) to facilitate bariatric surgery goals.  Physical activity is an important part of a healthy lifestyle so keep it moving! The goal is to reach 150 minutes of exercise per week, including cardiovascular and weight baring activity. Why you need complex carbohydrates: Whole grains and other complex carbohydrates are required to have a healthy diet. Whole grains provide fiber which can help with blood glucose levels and help keep you satiated. Fruits and starchy vegetables provide essential vitamins and minerals required for immune function, eyesight support, brain support, bone density, wound healing and many other functions within the body. According to the current evidenced based 2020-2025 Dietary Guidelines for Americans, complex carbohydrates are part of a healthy eating pattern which is associated with a decreased risk for type 2 diabetes, cancers, and cardiovascular disease.   Pre-Op Goals Progress & New Goals Continue: create routine/consistency, by having a bed time and wake up time. Eat within 1.5 hours of waking up in the morning. Continue: avoid all sugar-sweetened beverages (ex: regular soda, sports drinks)  Continue: avoid all carbonated beverages (ex: soda, sparkling beverages)  Continue: Consume 3 meals per day or try to eat every 3-5 hours Re-engage/continue: Practice not drinking 15 minutes before, during, and 30 minutes after each meal and snack Re-engage: focus on physical activity, increasing chair yoga to 4 days a week. Continue: increase fluid intake, aiming for 64 oz or greater. Continue: choose lean proteins, to include plant based proteins (ie beans, edamame, quinoa)  New: make substitutions in meals like:  potato salad and biscuit with  gravy, with roasted or baked potatoes and whole wheat toast.  Handouts Provided Include    Learning Style & Readiness for Change Teaching method utilized: Visual & Auditory  Demonstrated degree of understanding via: Teach Back  Readiness Level: contemplative  Barriers to learning/adherence to lifestyle change: mobility and previous habits  RD's Notes for next Visit  Progress toward patient's chosen goals  MONITORING & EVALUATION Dietary intake, weekly physical activity, body weight, and pre-op goals in 1 month.   Next Steps  Patient is to return to [facility] in one month for next SWL visit.

## 2022-06-21 ENCOUNTER — Encounter: Payer: Self-pay | Admitting: Family Medicine

## 2022-06-21 NOTE — Telephone Encounter (Signed)
  Patient 's Ins denied Monovisc request---They prefer Durolane, Uflexa ,Synvisc or Synvisc 1.  Says we can either call [phone] or fax response to them fax@ [phone]   Ref or case# [id number].  Benefits verified for Right knee Durolane.     06/21/22 Benefits verified for Right knee Monovisc/orthovisc.

## 2022-06-29 ENCOUNTER — Encounter: Payer: Self-pay | Admitting: Family Medicine

## 2022-06-29 ENCOUNTER — Ambulatory Visit (INDEPENDENT_AMBULATORY_CARE_PROVIDER_SITE_OTHER): Payer: Medicare HMO | Admitting: Family Medicine

## 2022-06-29 VITALS — BP 121/84 | HR 83 | Temp 97.8°F | Ht 61.5 in | Wt 299.4 lb

## 2022-06-29 DIAGNOSIS — L089 Local infection of the skin and subcutaneous tissue, unspecified: Secondary | ICD-10-CM | POA: Diagnosis not present

## 2022-06-29 MED ORDER — DOXYCYCLINE HYCLATE 100 MG PO TABS: 100.0000 mg | ORAL_TABLET | Freq: Two times a day (BID) | ORAL | 0 refills | Status: AC

## 2022-06-29 NOTE — Progress Notes (Signed)
Chief Complaint  Patient presents with   Cellulitis    Right leg     Christine [name] is a 72 y.o. female here for a skin complaint.  Duration: 3 days Location: RLE; bumped RLE on a ladder Pruritic? No Painful? Yes Drainage? Yes Other associated symptoms: Subjective fever; no purulent drainage or spreading redness Therapies tried thus far: none  Past Medical History:  Diagnosis Date   Allergy    dust, pollen, sulfa, prednisone   Anemia    Anxiety    Arthritis    "knees" (04/26/2016)   Colon polyps    benign per pt   Coronary artery disease    mild per 2015 cath in [place] (OM1 30%, RCA 30%)   GERD (gastroesophageal reflux disease)    Gout    History of hiatal hernia    Hyperlipidemia 05/20/2021   Hypertension    Migraine    "none since early /2017" (05/06/2016)   Obesity    OSA on CPAP    uses CPAP   Pre-operative cardiovascular examination 08/22/2008    BP 121/84 (BP Location: Left Arm, Patient Position: Sitting, Cuff Size: Large)   Pulse 83   Temp 97.8 F (36.6 C) (Oral)   Ht 5' 1.5" (1.562 m)   Wt 299 lb 6 oz (135.8 kg)   SpO2 97%   BMI 55.65 kg/m  Gen: awake, alert, appearing stated age Lungs: No accessory muscle use Skin: Chronic hyperpigmentation noted over bilateral lower extremities tapering at the distal third of the tibia anteriorly there is an area of serous drainage with excessive warmth and TTP fluctuance.  Psych: Age appropriate judgment and insight  Skin infection - Plan: doxycycline (VIBRA-TABS) 100 MG tablet  Hx of recurrent skin infections on legs. Similar presentation today. Will send in 7 d of doxy bid.  F/u prn. The patient voiced understanding and agreement to the plan.  [place], DO 06/29/22 10:35 AM

## 2022-06-29 NOTE — Patient Instructions (Signed)
Ice/cold pack over area for 10-15 min twice daily.  OK to take Tylenol 1000 mg (2 extra strength tabs) or 975 mg (3 regular strength tabs) every 6 hours as needed.  When you do wash it, use only soap and water. Do not vigorously scrub.  Keep the area clean and dry.   Things to look out for: increasing pain not relieved by acetaminophen, fevers, spreading redness, drainage of pus, or foul odor.  Let us know if you need anything. 

## 2022-07-01 ENCOUNTER — Other Ambulatory Visit: Payer: Self-pay

## 2022-07-01 ENCOUNTER — Encounter: Payer: Self-pay | Admitting: Family Medicine

## 2022-07-01 ENCOUNTER — Other Ambulatory Visit: Payer: Self-pay | Admitting: Family Medicine

## 2022-07-01 ENCOUNTER — Encounter (HOSPITAL_COMMUNITY): Payer: Self-pay

## 2022-07-01 ENCOUNTER — Ambulatory Visit (HOSPITAL_COMMUNITY)
Admission: RE | Admit: 2022-07-01 | Discharge: 2022-07-01 | Disposition: A | Discharge status: Home / Self Care | Payer: Medicare HMO | Source: Ambulatory Visit | Attending: Internal Medicine | Admitting: Internal Medicine

## 2022-07-01 VITALS — BP 153/78 | HR 109 | Temp 99.1°F | Resp 20

## 2022-07-01 DIAGNOSIS — M7989 Other specified soft tissue disorders: Secondary | ICD-10-CM

## 2022-07-01 DIAGNOSIS — L03115 Cellulitis of right lower limb: Secondary | ICD-10-CM

## 2022-07-01 DIAGNOSIS — I872 Venous insufficiency (chronic) (peripheral): Secondary | ICD-10-CM | POA: Diagnosis not present

## 2022-07-01 MED ORDER — OXYCODONE HCL 5 MG PO TABS: 5.0000 mg | ORAL_TABLET | Freq: Four times a day (QID) | ORAL | 0 refills | Status: DC | PRN

## 2022-07-01 MED ORDER — GABAPENTIN 100 MG PO CAPS: 100.0000 mg | ORAL_CAPSULE | Freq: Every day | ORAL | 0 refills | Status: DC

## 2022-07-01 MED ORDER — FUROSEMIDE 20 MG PO TABS: 20.0000 mg | ORAL_TABLET | Freq: Every day | ORAL | 0 refills | Status: DC

## 2022-07-01 MED ORDER — POTASSIUM CHLORIDE ER 10 MEQ PO TBCR: 10.0000 meq | EXTENDED_RELEASE_TABLET | Freq: Every day | ORAL | 0 refills | Status: DC

## 2022-07-01 NOTE — Discharge Instructions (Addendum)
Take lasix '20mg'$  and potassium 60mq once daily for the next 3 days to reduce the swelling to your legs.  Continue doxycycline antibiotic as prescribed.  Continue tylenol as needed for pain.   Start gabapentin '100mg'$  (1 pill) at bedtime as needed for nerve pain. You may increase this to '200mg'$  (2 pills) at bedtime as needed if the '100mg'$  dose does not help.  Please schedule an appointment with your primary care for follow-up evaluation.  Elevate your legs and wear compression stockings to reduce swelling further.  If you develop any new or worsening symptoms or do not improve in the next 2 to 3 days, please return.  If your symptoms are severe, please go to the emergency room.  Follow-up with your primary care provider for further evaluation and management of your symptoms as well as ongoing wellness visits.  I hope you feel better!

## 2022-07-01 NOTE — ED Triage Notes (Addendum)
Right lower leg issue .  Was given antibiotic and acetaminophen prescribed by dr Nani Ravens on 3/5.    Says she is hurting, acetaminophen is not helping.  Saw [facility] on 3/3  Patient reports it does not appear to be draining anymore. Patient states leg is not as warm as it was.

## 2022-07-01 NOTE — ED Provider Notes (Signed)
[name]    CSN: [id number] Arrival date & time: 07/01/22  1123      History   Chief Complaint Chief Complaint  Patient presents with   Leg Pain   Appointment    11:30    HPI Christine [name] is a 72 y.o. female.   Patient presents to urgent care for evaluation of redness, swelling, and pain to the right lower extremity that started 4 to 5 days ago.  She was placed on an antibiotic by her primary care provider for cellulitis of the right lower extremity with leg swelling and drainage.  States that doxycycline antibiotic has helped significantly with the infection, however she remains in significant pain.  Pain is a pressure/burning sensation.  She reports significant improvement in warmth and drainage but states that her legs remain very swollen.  Denies orthopnea, recent long periods of travel, posterior calf pain, posterior knee pain, and recent fall/trauma to the legs.  No chest pain, shortness of breath, cough, fever/chills, or heart palpitations reported.  Denies history of peripheral vascular disease.  Denies history of diabetes.  Former smoker.  Reports full sensation to the bilateral lower extremities distally.  She has been taking doxycycline for approximately 3 to 4 days out of 7-day prescription.  Taking Tylenol as needed for pain.      Past Medical History:  Diagnosis Date   Allergy    dust, pollen, sulfa, prednisone   Anemia    Anxiety    Arthritis    "knees" (04/26/2016)   Colon polyps    benign per pt   Coronary artery disease    mild per 2015 cath in [place] (OM1 30%, RCA 30%)   GERD (gastroesophageal reflux disease)    Gout    History of hiatal hernia    Hyperlipidemia 05/20/2021   Hypertension    Migraine    "none since early /2017" (05/06/2016)   Obesity    OSA on CPAP    uses CPAP   Pre-operative cardiovascular examination 08/22/2008    Patient Active Problem List   Diagnosis Date Noted   Gastric polyp 04/08/2022   Hyperlipidemia  05/20/2021   Lumbar radiculopathy 12/24/2020   Trigger little finger of left hand 12/16/2020   Medial epicondylitis of elbow, left 12/16/2020   Memory difficulties 12/11/2020   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   Globus sensation 11/15/2019   Gastroesophageal reflux disease 11/15/2019   Throat clearing 10/05/2019   Gout 08/31/2019   Phlebitis 08/31/2019   Prediabetes 08/31/2019   OA (osteoarthritis) of knee 05/17/2019   Acute pain of right knee 05/17/2019   Abdominal pain 03/31/2019   Morbid obesity ([place] 03/29/2018   GAD (generalized anxiety disorder) 02/27/2018   Mixed incontinence urge and stress 02/27/2018   OSA on CPAP 06/16/2017   Acute appendicitis 05/06/2016   Gastro-esophageal reflux disease with esophagitis 07/26/2011   Essential hypertension 07/26/2011   Osteoarthrosis 10/16/2009   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, including shoulder 08/22/2008   Pre-operative cardiovascular examination 08/22/2008   Anxiety 04/27/2003    Past Surgical History:  Procedure Laterality Date   ABDOMINAL HYSTERECTOMY     "partial"; both ovaries present   APPENDECTOMY  05/06/2016   BIOPSY  04/08/2022   Procedure: BIOPSY;  Surgeon: Doran Stabler, MD;  Location: [facility];  Service: Gastroenterology;;   CARDIAC CATHETERIZATION  ~ 2015   CARDIAC CATHETERIZATION  2015   In [place] Right  06/01/2017   Procedure: CHILECTOMY RIGHT FOOT;  Surgeon: Edrick Kins, DPM;  Location: [place];  Service: Podiatry;  Laterality: Right;   ESOPHAGOGASTRODUODENOSCOPY (EGD) WITH PROPOFOL N/A 04/08/2022   Procedure: ESOPHAGOGASTRODUODENOSCOPY (EGD) WITH PROPOFOL;  Surgeon: Doran Stabler, MD;  Location: [facility];  Service: Gastroenterology;  Laterality: N/A;   LAPAROSCOPIC APPENDECTOMY N/A 05/06/2016   Procedure: LAPAROSCOPIC APPENDECTOMY;  Surgeon: Donnie Mesa, MD;  Location: [place];  Service: General;  Laterality:  N/A;   POLYPECTOMY  04/08/2022   Procedure: POLYPECTOMY;  Surgeon: Doran Stabler, MD;  Location: [facility];  Service: Gastroenterology;;   TONSILLECTOMY      OB History     Gravida  0   Para  0   Term  0   Preterm  0   AB  0   Living  0      SAB  0   IAB  0   Ectopic  0   Multiple  0   Live Births  0            Home Medications    Prior to Admission medications   Medication Sig Start Date End Date Taking? Authorizing Provider  furosemide (LASIX) 20 MG tablet Take 1 tablet (20 mg total) by mouth daily for 3 days. 07/01/22 07/04/22 Yes StanhopeStasia Cavalier, FNP  gabapentin (NEURONTIN) 100 MG capsule Take 1 capsule (100 mg total) by mouth at bedtime for 14 days. 07/01/22 07/15/22 Yes Taneal Sonntag, Stasia Cavalier, FNP  potassium chloride (KLOR-CON) 10 MEQ tablet Take 1 tablet (10 mEq total) by mouth daily. 07/01/22  Yes Talbot Grumbling, FNP  aspirin 81 MG EC tablet Take 81 mg by mouth daily.    [provider]  doxycycline (VIBRA-TABS) 100 MG tablet Take 1 tablet (100 mg total) by mouth 2 (two) times daily for 7 days. 06/29/22 07/06/22  Shelda Pal, DO  DULoxetine (CYMBALTA) 60 MG capsule Take 1 capsule (60 mg total) by mouth daily. 12/25/21   Shelda Pal, DO  esomeprazole (NEXIUM) 40 MG capsule Take 1 capsule (40 mg total) by mouth daily. 12/25/21   Shelda Pal, DO  ferrous sulfate 325 (65 FE) MG EC tablet Take 325 mg by mouth daily with breakfast.    [provider]  ibuprofen (ADVIL) 800 MG tablet Take 1 tablet (800 mg total) by mouth every 8 (eight) hours as needed. 06/14/22   Shelda Pal, DO  metoprolol succinate (TOPROL-XL) 50 MG 24 hr tablet Take 1 tablet (50 mg total) by mouth daily. Take with or immediately following a meal. 05/21/22   Wendling, Crosby Oyster, DO  oxybutynin (DITROPAN XL) 15 MG 24 hr tablet TAKE ONE TABLET BY MOUTH EVERY NIGHT AT BEDTIME 09/15/21   Wendling, Crosby Oyster, DO  oxyCODONE  (OXY IR/ROXICODONE) 5 MG immediate release tablet Take 1 tablet (5 mg total) by mouth every 6 (six) hours as needed for severe pain. 07/01/22   Shelda Pal, DO  rosuvastatin (CRESTOR) 10 MG tablet Take 1 tablet (10 mg total) by mouth daily. 12/25/21   Wendling, Crosby Oyster, DO  triamterene-hydrochlorothiazide (MAXZIDE) 75-50 MG tablet Take 1 tablet by mouth daily. 03/25/22   Shelda Pal, DO    Family History Family History  Problem Relation Age of Onset   Diabetes Mother    Heart disease Mother    High blood pressure Mother    Asthma Mother    Arthritis Mother    Diabetes Father  Heart disease Father    High blood pressure Father    Asthma Father    Diabetes Sister    Alcohol abuse Brother    Drug abuse Brother    Arthritis Sister    Diabetes Sister    Varicose Veins Sister    Colon cancer Neg Hx    Esophageal cancer Neg Hx    Rectal cancer Neg Hx     Social History Social History   Tobacco Use   Smoking status: Former    Packs/day: 0.12    Years: 5.00    Total pack years: 0.60    Types: Cigarettes    Quit date: 04/26/1978    Years since quitting: 44.2   Smokeless tobacco: Never  Vaping Use   Vaping Use: Never used  Substance Use Topics   Alcohol use: No   Drug use: No     Allergies   Prednisone, Tape, and Sulfa antibiotics   Review of Systems Review of Systems Per HPI  Physical Exam Triage Vital Signs ED Triage Vitals  Enc Vitals Group     BP 07/01/22 1203 (!) 153/78     Pulse Rate 07/01/22 1203 (!) 109     Resp 07/01/22 1203 20     Temp 07/01/22 1203 99.1 F (37.3 C)     Temp Source 07/01/22 1203 Oral     SpO2 07/01/22 1203 95 %     Weight --      Height --      Head Circumference --      Peak Flow --      Pain Score 07/01/22 1201 8     Pain Loc --      Pain Edu? --      Excl. in [place]? --    No data found.  Updated Vital Signs BP (!) 153/78 (BP Location: Left Arm) Comment (BP Location): regular cuff to left forearm   Pulse (!) 109   Temp 99.1 F (37.3 C) (Oral)   Resp 20   SpO2 95%   Visual Acuity Right Eye Distance:   Left Eye Distance:   Bilateral Distance:    Right Eye Near:   Left Eye Near:    Bilateral Near:     Physical Exam Vitals and nursing note reviewed.  Constitutional:      Appearance: She is not ill-appearing or toxic-appearing.  HENT:     Head: Normocephalic and atraumatic.     Right Ear: Hearing and external ear normal.     Left Ear: Hearing and external ear normal.     Nose: Nose normal.     Mouth/Throat:     Lips: Pink.     Mouth: Mucous membranes are moist. No injury.     Tongue: No lesions. Tongue does not deviate from midline.     Palate: No mass and lesions.     Pharynx: Oropharynx is clear. Uvula midline. No pharyngeal swelling, oropharyngeal exudate, posterior oropharyngeal erythema or uvula swelling.     Tonsils: No tonsillar exudate or tonsillar abscesses.  Eyes:     General: Lids are normal. Vision grossly intact. Gaze aligned appropriately.     Extraocular Movements: Extraocular movements intact.     Conjunctiva/sclera: Conjunctivae normal.  Cardiovascular:     Rate and Rhythm: Normal rate and regular rhythm.     Pulses:          Dorsalis pedis pulses are 2+ on the right side and 2+ on the left side.  Heart sounds: Normal heart sounds, S1 normal and S2 normal.  Pulmonary:     Effort: Pulmonary effort is normal. No respiratory distress.     Breath sounds: Normal breath sounds and air entry.  Musculoskeletal:     Cervical back: Neck supple.     Right lower leg: 2+ Pitting Edema present.     Left lower leg: 2+ Pitting Edema present.     Comments: Bevelyn Buckles' sign negative bilaterally.  Skin:    General: Skin is warm and dry.     Capillary Refill: Capillary refill takes less than 2 seconds.     Findings: No rash.     Comments: Venous stasis dermatitis to bilateral lower extremities with evidence of erythema, warmth, and swelling to the right lower  extremity as seen in images below.  +2 DP pulses bilaterally.  Neurological:     General: No focal deficit present.     Mental Status: She is alert and oriented to person, place, and time. Mental status is at baseline.     Cranial Nerves: No dysarthria or facial asymmetry.     Comments: Sensation and strength intact to bilateral lower extremities.   Psychiatric:        Mood and Affect: Mood normal.        Speech: Speech normal.        Behavior: Behavior normal.        Thought Content: Thought content normal.        Judgment: Judgment normal.           UC Treatments / Results  Labs (all labs ordered are listed, but only abnormal results are displayed) Labs Reviewed - No data to display  EKG   Radiology No results found.  Procedures Procedures (including critical care time)  Medications Ordered in UC Medications - No data to display  Initial Impression / Assessment and Plan / UC Course  I have reviewed the triage vital signs and the nursing notes.  Pertinent labs & imaging results that were available during my care of the patient were reviewed by me and considered in my medical decision making (see chart for details).   1.  Cellulitis of right lower extremities, venous stasis dermatitis of bilateral lower extremities, leg swelling Presentation is consistent with unilateral cellulitis of the right lower extremity that is improving significantly since initial onset with use of doxycycline antibiotic.  Advised patient to continue taking antibiotic.  Significant pain is likely due to leg swelling.  Renal function from [facility] on May 21, 2022 shows normal renal function.  She does not appear to be systemically volume overloaded and lungs are clear to auscultation without adventitious sounds, therefore deferred imaging.  Vitals are hemodynamically stable and patient is nontoxic in appearance/afebrile.  Lasix 20 mg and potassium 10 mEq daily in the morning for the next 3 days sent  to pharmacy to reduce swelling to the bilateral lower extremities.  Low suspicion for DVT etiology.  Patient may use gabapentin 100 mg at bedtime as needed for nerve pain due to significant swelling.  Drowsiness precautions discussed.  Advised to purchase compression stockings and elevate the bilateral lower extremities.  PCP follow-up recommended in the next 1 to 2 weeks to ensure that infection has improved and resolved.   Discussed physical exam and available lab work findings in clinic with patient.  Counseled patient regarding appropriate use of medications and potential side effects for all medications recommended or prescribed today. Discussed red flag signs and symptoms of worsening  condition,when to call the PCP office, return to urgent care, and when to seek higher level of care in the emergency department. Patient verbalizes understanding and agreement with plan. All questions answered. Patient discharged in stable condition.   Final Clinical Impressions(s) / UC Diagnoses   Final diagnoses:  Cellulitis of right lower extremity  Venous stasis dermatitis of both lower extremities  Leg swelling     Discharge Instructions      Take lasix 20mg  and potassium 68mEq once daily for the next 3 days to reduce the swelling to your legs.  Continue doxycycline antibiotic as prescribed.  Continue tylenol as needed for pain.   Start gabapentin 100mg  (1 pill) at bedtime as needed for nerve pain. You may increase this to 200mg  (2 pills) at bedtime as needed if the 100mg  dose does not help.  Please schedule an appointment with your primary care for follow-up evaluation.  Elevate your legs and wear compression stockings to reduce swelling further.  If you develop any new or worsening symptoms or do not improve in the next 2 to 3 days, please return.  If your symptoms are severe, please go to the emergency room.  Follow-up with your primary care provider for further evaluation and management of  your symptoms as well as ongoing wellness visits.  I hope you feel better!   ED Prescriptions     Medication Sig Dispense Auth. Provider   furosemide (LASIX) 20 MG tablet Take 1 tablet (20 mg total) by mouth daily for 3 days. 3 tablet Joella Prince M, FNP   potassium chloride (KLOR-CON) 10 MEQ tablet Take 1 tablet (10 mEq total) by mouth daily. 3 tablet Talbot Grumbling, FNP   gabapentin (NEURONTIN) 100 MG capsule Take 1 capsule (100 mg total) by mouth at bedtime for 14 days. 14 capsule Talbot Grumbling, FNP      PDMP not reviewed this encounter.   Talbot Grumbling, [place] 07/04/22 2210

## 2022-07-06 NOTE — Telephone Encounter (Signed)
Pt informed of below.   Rec'd fax Monovisc is approved 06/21/22 to 12/20/22. Patient's plan covers 80% of Monovisc. Patient has $20 copay. Deductible does not apply. Patient will have remaining balance of approximately $540 that will be billed. Monovisc ordered with rep. Will call patient to schedule once I received confirmation text.

## 2022-07-07 NOTE — Telephone Encounter (Signed)
Monovisc rec'd. Called pt. OV scheduled 07/08/22 at 10:10 am.

## 2022-07-08 ENCOUNTER — Ambulatory Visit: Payer: Medicare HMO | Admitting: Family Medicine

## 2022-07-08 ENCOUNTER — Other Ambulatory Visit: Payer: Self-pay

## 2022-07-08 VITALS — BP 148/84 | Ht 61.5 in | Wt 296.0 lb

## 2022-07-08 DIAGNOSIS — M1711 Unilateral primary osteoarthritis, right knee: Secondary | ICD-10-CM

## 2022-07-08 MED ORDER — HYALURONAN 88 MG/4ML IX SOSY
88.0000 mg | PREFILLED_SYRINGE | Freq: Once | INTRA_ARTICULAR | Status: AC
  Administered 2022-07-08: 88 mg via INTRA_ARTICULAR

## 2022-07-08 NOTE — Addendum Note (Signed)
Addended by: Marva Panda on: 07/08/2022 11:05 AM   Modules accepted: Orders

## 2022-07-08 NOTE — Progress Notes (Signed)
  Christine [name] - 72 y.o. female MRN [id number]  Date of birth: 1950-06-22  SUBJECTIVE:  Including CC & ROS.  No chief complaint on file.   Christine [name] is a 72 y.o. female that is here for gel injection.    Review of Systems See HPI   HISTORY: Past Medical, Surgical, Social, and Family History Reviewed & Updated per EMR.   Pertinent Historical Findings include:  Past Medical History:  Diagnosis Date   Allergy    dust, pollen, sulfa, prednisone   Anemia    Anxiety    Arthritis    "knees" (04/26/2016)   Colon polyps    benign per pt   Coronary artery disease    mild per 2015 cath in [place] (OM1 30%, RCA 30%)   GERD (gastroesophageal reflux disease)    Gout    History of hiatal hernia    Hyperlipidemia 05/20/2021   Hypertension    Migraine    "none since early /2017" (05/06/2016)   Obesity    OSA on CPAP    uses CPAP   Pre-operative cardiovascular examination 08/22/2008    Past Surgical History:  Procedure Laterality Date   ABDOMINAL HYSTERECTOMY     "partial"; both ovaries present   APPENDECTOMY  05/06/2016   BIOPSY  04/08/2022   Procedure: BIOPSY;  Surgeon: Doran Stabler, MD;  Location: [facility];  Service: Gastroenterology;;   CARDIAC CATHETERIZATION  ~ 2015   CARDIAC CATHETERIZATION  2015   In [place] Right 06/01/2017   Procedure: CHILECTOMY RIGHT FOOT;  Surgeon: Edrick Kins, DPM;  Location: [place];  Service: Podiatry;  Laterality: Right;   ESOPHAGOGASTRODUODENOSCOPY (EGD) WITH PROPOFOL N/A 04/08/2022   Procedure: ESOPHAGOGASTRODUODENOSCOPY (EGD) WITH PROPOFOL;  Surgeon: Doran Stabler, MD;  Location: [facility];  Service: Gastroenterology;  Laterality: N/A;   LAPAROSCOPIC APPENDECTOMY N/A 05/06/2016   Procedure: LAPAROSCOPIC APPENDECTOMY;  Surgeon: Donnie Mesa, MD;  Location: [place];  Service: General;  Laterality: N/A;   POLYPECTOMY  04/08/2022   Procedure: POLYPECTOMY;  Surgeon: Doran Stabler, MD;  Location: [facility];  Service: Gastroenterology;;   TONSILLECTOMY       PHYSICAL EXAM:  VS: BP (!) 148/84   Ht 5' 1.5" (1.562 m)   Wt 296 lb (134.3 kg)   BMI 55.02 kg/m  Physical Exam Gen: NAD, alert, cooperative with exam, well-appearing MSK:  Neurovascularly intact     Aspiration/Injection Procedure Note Christine [name] 1951-02-07  Procedure: Injection Indications: right knee pain  Procedure Details Consent: Risks of procedure as well as the alternatives and risks of each were explained to the (patient/caregiver).  Consent for procedure obtained. Time Out: Verified patient identification, verified procedure, site/side was marked, verified correct patient position, special equipment/implants available, medications/allergies/relevent history reviewed, required imaging and test results available.  Performed.  The area was cleaned with iodine and alcohol swabs.    The right knee superior lateral suprapatellar pouch was injected using 4 cc's of 1% lidocaine and 0.4 cc cc of 8.4% sodium bicarb with a 22 2" needle.  The syringe was switched and 4 mL of 22 mg/mL of monovisc was injected. Ultrasound was used. Images were obtained in  Long views showing the injection.    A sterile dressing was applied.  Patient did tolerate procedure well.    ASSESSMENT & PLAN:   OA (osteoarthritis) of knee Completed gel injection

## 2022-07-08 NOTE — Patient Instructions (Signed)
Good to see you Please use ice as needed   Please send me a message in MyChart with any questions or updates.  Please see me back in 4 weeks or as needed if better.   --Dr. Lisvet Rasheed  

## 2022-07-08 NOTE — Assessment & Plan Note (Signed)
Completed gel injection.  

## 2022-07-12 DIAGNOSIS — I8311 Varicose veins of right lower extremity with inflammation: Secondary | ICD-10-CM | POA: Diagnosis not present

## 2022-07-12 DIAGNOSIS — I872 Venous insufficiency (chronic) (peripheral): Secondary | ICD-10-CM | POA: Diagnosis not present

## 2022-07-12 DIAGNOSIS — I8312 Varicose veins of left lower extremity with inflammation: Secondary | ICD-10-CM | POA: Diagnosis not present

## 2022-07-14 ENCOUNTER — Encounter: Payer: Self-pay | Admitting: Family Medicine

## 2022-07-14 ENCOUNTER — Ambulatory Visit (INDEPENDENT_AMBULATORY_CARE_PROVIDER_SITE_OTHER): Payer: Medicare HMO | Admitting: Family Medicine

## 2022-07-14 VITALS — BP 120/85 | HR 93 | Temp 97.5°F | Ht 61.5 in | Wt 299.2 lb

## 2022-07-14 DIAGNOSIS — M79604 Pain in right leg: Secondary | ICD-10-CM

## 2022-07-14 MED ORDER — METHYLPREDNISOLONE 4 MG PO TBPK: ORAL_TABLET | ORAL | 0 refills | Status: DC

## 2022-07-14 NOTE — Progress Notes (Signed)
Musculoskeletal Exam  Patient: Christine [name] DOB: [birth year]  DOS: 07/14/2022  SUBJECTIVE:  Chief Complaint:   Chief Complaint  Patient presents with   Leg Pain    Right     Christine [name] is a 72 y.o.  female for evaluation and treatment of R leg pain.   Onset:  1 yr ago, comes and goes. Came back a few weeks ago. No inj or change in activity.  Location: R lateral leg Character:  aching  Progression of issue:  is unchanged Associated symptoms: no redness, bruising, swelling Treatment: to date has been acetaminophen.   Neurovascular symptoms: no  Past Medical History:  Diagnosis Date   Allergy    dust, pollen, sulfa, prednisone   Anemia    Anxiety    Arthritis    "knees" (04/26/2016)   Colon polyps    benign per pt   Coronary artery disease    mild per 2015 cath in [place] (OM1 30%, RCA 30%)   GERD (gastroesophageal reflux disease)    Gout    History of hiatal hernia    Hyperlipidemia 05/20/2021   Hypertension    Migraine    "none since early /2017" (05/06/2016)   Obesity    OSA on CPAP    uses CPAP   Pre-operative cardiovascular examination 08/22/2008    Objective: VITAL SIGNS: BP 120/85 (BP Location: Left Arm, Patient Position: Sitting, Cuff Size: Large)   Pulse 93   Temp (!) 97.5 F (36.4 C) (Oral)   Ht 5' 1.5" (1.562 m)   Wt 299 lb 4 oz (135.7 kg)   SpO2 96%   BMI 55.63 kg/m  Constitutional: Well formed, well developed. No acute distress. Thorax & Lungs: No accessory muscle use Musculoskeletal: RLE.   Tenderness to palpation: yes along peroneal muscle belly and tendon Deformity: no Ecchymosis: no Tests positive: none Tests negative: squeeze Neurologic: Normal sensory function. Gait normal for pt Psychiatric: Normal mood. Age appropriate judgment and insight. Alert & oriented x 3.    Assessment:  Pain of right lower extremity - Plan: methylPREDNISolone (MEDROL DOSEPAK) 4 MG TBPK tablet  Plan: Medrol dosepak. Stretches/exercises, heat,  ice, Tylenol. PT if no better.  F/u prn. The patient voiced understanding and agreement to the plan.   [place], DO 07/14/22  4:46 PM

## 2022-07-14 NOTE — Patient Instructions (Signed)
Ice/cold pack over area for 10-15 min twice daily.  Heat (pad or rice pillow in microwave) over affected area, 10-15 minutes twice daily.   OK to take Tylenol 1000 mg (2 extra strength tabs) or 975 mg (3 regular strength tabs) every 6 hours as needed.  Let us know if you need anything.  Peroneal Tendinopathy Rehab Ask your health care provider which exercises are safe for you. Do exercises exactly as told by your health care provider and adjust them as directed. It is normal to feel mild stretching, pulling, tightness, or discomfort as you do these exercises. Stop right away if you feel sudden pain or your pain gets worse. Do not begin these exercises until told by your health care provider. Stretching and range-of-motion exercises These exercises warm up your muscles and joints. They can help improve the movement and flexibility of your ankle. They may also help to relieve pain and stiffness. Gastrocnemius and soleus stretch, standing A person standing on the ball of one foot on the edge of a step. The person holds on to a chair while lifting the other foot.   This is an exercise in which you stand on a step and use your body weight to stretch your calf muscles. To do this exercise: Stand on the edge of a step on the ball of your left / right foot. The ball of your foot is on the walking surface, right under your toes. Keep your other foot firmly on the same step. Hold on to the wall, a railing, or a chair for balance. Slowly lift your other foot, allowing your body weight to press your left / right heel down over the edge of the step. You should feel a stretch in your left / right calf (gastrocnemius and soleus). Hold this position for 30 seconds. Return both feet to the step. Repeat this exercise with a slight bend in your left / right knee. Repeat 2 times with your left / right knee straight and 3 times with your left / right knee bent. Complete this exercise 3 times a  week. Strengthening exercises These exercises build strength and endurance in your foot and ankle. Endurance is the ability to use your muscles for a long time, even after they get tired. Ankle dorsiflexion with band A person sits on floor facing table. A band is around one foot and one table leg. Person pulls the foot toward the body.   Secure a rubber exercise band or tube to an object, such as a table leg, that will not move when the band is pulled. Secure the other end of the band around your left / right foot. Sit on the floor. Face the object with your left / right leg extended. The band or tube should be slightly tense when your foot is relaxed. Slowly flex your left / right ankle and toes to bring your foot toward you (dorsiflexion). Hold this position for 3 seconds. Let the band or tube slowly pull your foot back to the starting position. Repeat 9 times. Complete this exercise 3 times per week. Ankle eversion A person sits on floor next to table. A band is around foot and table leg. Person pushes foot outward, away from other leg.   Sit on the floor with your legs straight out in front of you. Loop a rubber exercise band or tube around the ball of your left / right foot. The ball of your foot is on the walking surface, right under your toes.  Hold the ends of the band in your hands. You can also secure the band to a stable object. The band or tube should be slightly tense when your foot is relaxed. Slowly push your foot outward, away from your other leg (eversion). Hold this position for 3 seconds. Slowly return your foot to the starting position. Repeat 9 times. Complete this exercise 3 times per week. Plantar flexion, standing A person stands next to table. Person places hands on the table for support, and rises up on the toes of both feet.   This exercise is sometimes called a standing heel raise. Stand with your feet shoulder-width apart. Place your hands on a wall or table  to steady yourself as needed. Try not to use it for support. Keep your weight spread evenly over the width of your feet while you slowly rise up on your toes (plantar flexion). If told by your health care provider: Shift your weight toward your left / right leg until you feel challenged. Stand on your left / right leg only. Hold this position for 3 seconds. Repeat 9 times. Complete this exercise 3 times per week. Single leg stand Person holding on to door frame and balancing on one foot. Person keeps the big toe on the floor with the foot arch lifted.   Without shoes, stand near a railing or in a doorway. You may hold on to the railing or doorframe as needed. Stand on your left / right foot. Keep your big toe down on the floor and try to keep your arch lifted. Do not roll to the outside of your foot. If this exercise is too easy, you can try it with your eyes closed or while standing on a pillow. Hold this position for 3 seconds. Repeat 9 times. Complete this exercise 3 times per week.

## 2022-07-15 ENCOUNTER — Encounter: Payer: Self-pay | Admitting: Dietician

## 2022-07-15 ENCOUNTER — Encounter: Payer: Medicare HMO | Attending: Surgery | Admitting: Dietician

## 2022-07-15 VITALS — Ht 61.5 in | Wt 298.0 lb

## 2022-07-15 DIAGNOSIS — I10 Essential (primary) hypertension: Secondary | ICD-10-CM | POA: Diagnosis not present

## 2022-07-15 DIAGNOSIS — Z79899 Other long term (current) drug therapy: Secondary | ICD-10-CM | POA: Insufficient documentation

## 2022-07-15 DIAGNOSIS — E669 Obesity, unspecified: Secondary | ICD-10-CM | POA: Diagnosis present

## 2022-07-15 DIAGNOSIS — Z713 Dietary counseling and surveillance: Secondary | ICD-10-CM | POA: Insufficient documentation

## 2022-07-15 DIAGNOSIS — G4730 Sleep apnea, unspecified: Secondary | ICD-10-CM | POA: Diagnosis not present

## 2022-07-15 DIAGNOSIS — E785 Hyperlipidemia, unspecified: Secondary | ICD-10-CM | POA: Insufficient documentation

## 2022-07-15 DIAGNOSIS — Z6841 Body Mass Index (BMI) 40.0 and over, adult: Secondary | ICD-10-CM | POA: Insufficient documentation

## 2022-07-15 DIAGNOSIS — D649 Anemia, unspecified: Secondary | ICD-10-CM | POA: Diagnosis not present

## 2022-07-15 NOTE — Progress Notes (Signed)
Supervised Weight Loss Visit Bariatric Nutrition Education  6 out of 6 SWL Appointments   Pt completed visits.   Pt has cleared nutrition requirements.   Planned surgery: Sleeve Gastrectomy   NUTRITION ASSESSMENT   Anthropometrics  Start weight at [facility]: 311.2 lbs (date: 01/21/2022) Height: 61.5 in Weight today: 298.0 lbs. BMI: 55.39 kg/m2     Clinical  Medical hx: Anemia, anxiety, arthritis, GERD, Hyperlipidemia, HTN, sleep apnea Medications: Esomeprazole, rosuvastatin, duloxetine, ibuprofen, metoprolol tartrate, oxybutynin, triamterene HCTZ  Labs: A1C 6.4; glucose 106; hemoglobin 10.6; hematocrit 33.2 Notable signs/symptoms: none noted Any previous deficiencies? No  Lifestyle & Dietary Hx  Pt states she found some cook books she forgot she had and made some healthy meals from them. Pt states she has done well substituting with better food choices, stating she was looking in her refrigerator and realized that it is stocked differently.  Pt states she is done with fried chicken. Pt states she has been doing well putting the fork down between bites. Pt states she needs to focus on time discipline, stating she wants to stay on schedule, getting up on a routine and meals and snacks at scheduled times. Pt states she wants to also increase her physical activity.  Estimated daily fluid intake: fluctuating (mostly water) 66 oz (3 of Cirkul bottle) Supplements: Iron, fish oil Current average weekly physical activity: ADLs, chair yoga (silver sneakers website), 3 times a week, 30-40 minutes; indoor walking program (online source)  24-Hr Dietary Recall First Meal: boiled egg, [place] sausage or cereal (raisin bran crunch with blueberries or quinoa breakfast salad (egg, avocado, salad greens, carrots, dressing, quinoa) Snack: banana or granola bar or watermelon Second Meal: protein snack/tray or blue crabs or chef salad (vinaigrette dressing) or [place] sandwich on whole wheat  bread Snack:  Third Meal: chef salad or pork chop, string beans, potato salad or baked chicken wings, baked potato, string beans Snack: watermelon Beverages: water, Cirkul water, hot tea (Splenda)  Estimated Energy Needs Calories: 1500  NUTRITION DIAGNOSIS  Overweight/obesity (Crary-3.3) related to past poor dietary habits and physical inactivity as evidenced by patient w/ planned sleeve surgery following dietary guidelines for continued weight loss.  NUTRITION INTERVENTION  Nutrition counseling (C-1) and education (E-2) to facilitate bariatric surgery goals.  Physical activity is an important part of a healthy lifestyle so keep it moving! The goal is to reach 150 minutes of exercise per week, including cardiovascular and weight baring activity. Why you need complex carbohydrates: Whole grains and other complex carbohydrates are required to have a healthy diet. Whole grains provide fiber which can help with blood glucose levels and help keep you satiated. Fruits and starchy vegetables provide essential vitamins and minerals required for immune function, eyesight support, brain support, bone density, wound healing and many other functions within the body. According to the current evidenced based 2020-2025 Dietary Guidelines for Americans, complex carbohydrates are part of a healthy eating pattern which is associated with a decreased risk for type 2 diabetes, cancers, and cardiovascular disease.  Encouraged patient to honor their body's internal hunger and fullness cues.  Throughout the day, check in mentally and rate hunger. Stop eating when satisfied not full regardless of how much food is left on the plate.  Get more if still hungry 20-30 minutes later.  The key is to honor satisfaction so throughout the meal, rate fullness factor and stop when comfortably satisfied not physically full. The key is to honor hunger and fullness without any feelings of guilt or shame.  Pay attention to what the internal  cues are, rather than any external factors. This will enhance the confidence you have in listening to your own body and following those internal cues enabling you to increase how often you eat when you are hungry not out of appetite and stop when you are satisfied not full.  Encouraged pt to continue to eat balanced meals inclusive of non starchy vegetables 2 times a day 7 days a week Encouraged pt to choose lean protein sources: limiting beef, pork, sausage, hotdogs, and lunch meat Encourage pt to choose healthy fats such as plant based limiting animal fats Encouraged pt to continue to drink a minium 64 fluid ounces with half being plain water to satisfy proper hydration    Pre-Op Goals Progress & New Goals Continue: avoid all sugar-sweetened beverages (ex: regular soda, sports drinks)  Continue: avoid all carbonated beverages (ex: soda, sparkling beverages)  Continue: Consume 3 meals per day or try to eat every 3-5 hours Continue: Practice not drinking 15 minutes before, during, and 30 minutes after each meal and snack Continue: increase fluid intake, aiming for 64 oz or greater. Continue: choose lean proteins, to include plant based proteins (ie beans, edamame, quinoa) Continue: make substitutions in meals like:  potato salad and biscuit with gravy, with roasted or baked potatoes and whole wheat toast. Re-engage: focus on physical activity, increasing chair yoga to 4 days a week. Continue: create routine/consistency, by having a bed time and wake up time. Eat within 1.5 hours of waking up in the morning.  Handouts Provided Include    Learning Style & Readiness for Change Teaching method utilized: Visual & Auditory  Demonstrated degree of understanding via: Teach Back  Readiness Level: contemplative  Barriers to learning/adherence to lifestyle change: mobility and previous habits  RD's Notes for next Visit  Progress toward patient's chosen goals  MONITORING & EVALUATION Dietary  intake, weekly physical activity, body weight, and pre-op goals in 1 month.   Next Steps  Pt has completed visits. No further supervised visits required/recommended. Patient is to return to [facility] for pre-op class >2 week prior to scheduled surgery.

## 2022-07-21 ENCOUNTER — Ambulatory Visit: Payer: Medicare HMO | Admitting: Family Medicine

## 2022-07-22 ENCOUNTER — Ambulatory Visit: Payer: Medicare HMO | Admitting: Family Medicine

## 2022-07-28 ENCOUNTER — Ambulatory Visit (INDEPENDENT_AMBULATORY_CARE_PROVIDER_SITE_OTHER): Payer: Medicare HMO | Admitting: Podiatry

## 2022-07-28 DIAGNOSIS — M79675 Pain in left toe(s): Secondary | ICD-10-CM | POA: Diagnosis not present

## 2022-07-28 DIAGNOSIS — B351 Tinea unguium: Secondary | ICD-10-CM | POA: Diagnosis not present

## 2022-07-28 DIAGNOSIS — M79674 Pain in right toe(s): Secondary | ICD-10-CM

## 2022-07-28 NOTE — Progress Notes (Signed)
   SUBJECTIVE Patient presents to office today complaining of elongated, thickened nails that cause pain while ambulating in shoes.  Patient is unable to trim their own nails. Patient is here for further evaluation and treatment.  Past Medical History:  Diagnosis Date   Allergy    dust, pollen, sulfa, prednisone   Anemia    Anxiety    Arthritis    "knees" (04/26/2016)   Colon polyps    benign per pt   Coronary artery disease    mild per 2015 cath in [place] (OM1 30%, RCA 30%)   GERD (gastroesophageal reflux disease)    Gout    History of hiatal hernia    Hyperlipidemia 05/20/2021   Hypertension    Migraine    "none since early /2017" (05/06/2016)   Obesity    OSA on CPAP    uses CPAP   Pre-operative cardiovascular examination 08/22/2008    OBJECTIVE General Patient is awake, alert, and oriented x 3 and in no acute distress. Derm Skin is dry and supple bilateral. Negative open lesions or macerations. Remaining integument unremarkable. Nails are tender, long, thickened and dystrophic with subungual debris, consistent with onychomycosis, 1-5 bilateral. No signs of infection noted. Vasc  DP and PT pedal pulses palpable bilaterally. Temperature gradient within normal limits.  Neuro Epicritic and protective threshold sensation grossly intact bilaterally.  Musculoskeletal Exam No symptomatic pedal deformities noted bilateral. Muscular strength within normal limits.  ASSESSMENT 1.  Pain due to onychomycosis of toenails both  PLAN OF CARE 1. Patient evaluated today.  2. Instructed to maintain good pedal hygiene and foot care.  3. Mechanical debridement of nails 1-5 bilaterally performed using a nail nipper. Filed with dremel without incident.  4. Return to clinic in 3 mos.    Edrick Kins, DPM [facility]  Dr. Edrick Kins, DPM    [address], [place] [address]                Office [phone]  Fax [phone]

## 2022-08-09 ENCOUNTER — Encounter: Payer: Self-pay | Admitting: *Deleted

## 2022-08-13 DIAGNOSIS — G4733 Obstructive sleep apnea (adult) (pediatric): Secondary | ICD-10-CM | POA: Diagnosis not present

## 2022-08-13 DIAGNOSIS — I10 Essential (primary) hypertension: Secondary | ICD-10-CM | POA: Diagnosis not present

## 2022-08-13 DIAGNOSIS — E785 Hyperlipidemia, unspecified: Secondary | ICD-10-CM | POA: Diagnosis not present

## 2022-08-13 DIAGNOSIS — K219 Gastro-esophageal reflux disease without esophagitis: Secondary | ICD-10-CM | POA: Diagnosis not present

## 2022-08-13 DIAGNOSIS — Z6841 Body Mass Index (BMI) 40.0 and over, adult: Secondary | ICD-10-CM | POA: Diagnosis not present

## 2022-08-15 ENCOUNTER — Other Ambulatory Visit: Payer: Self-pay | Admitting: Family Medicine

## 2022-08-20 ENCOUNTER — Ambulatory Visit: Payer: Self-pay | Admitting: Surgery

## 2022-08-20 DIAGNOSIS — Z01818 Encounter for other preprocedural examination: Secondary | ICD-10-CM

## 2022-08-23 ENCOUNTER — Encounter: Payer: Medicare HMO | Attending: Surgery | Admitting: Skilled Nursing Facility1

## 2022-08-23 ENCOUNTER — Encounter: Payer: Self-pay | Admitting: Skilled Nursing Facility1

## 2022-08-23 DIAGNOSIS — E669 Obesity, unspecified: Secondary | ICD-10-CM | POA: Insufficient documentation

## 2022-08-23 DIAGNOSIS — Z713 Dietary counseling and surveillance: Secondary | ICD-10-CM | POA: Diagnosis not present

## 2022-08-23 NOTE — Progress Notes (Signed)
Pre-Operative Nutrition Class:    Patient was seen on 08/23/2022 for Pre-Operative Bariatric Surgery Education [facility]    Pt arrives with the use of a cane stating her legs give out and she struggles with mobility. Dietitian enquired if she has done PT and she responded yes several times.   Surgery date: 09/14/2022 Surgery type: RYGB Start weight at [facility]: 311.2 Weight today: pt arrived too late   The following the learning objectives were met by the patient during this course: Identify Pre-Op Dietary Goals and will begin 2 weeks pre-operatively Identify appropriate sources of fluids and proteins  State protein recommendations and appropriate sources pre and post-operatively Identify Post-Operative Dietary Goals and will follow for 2 weeks post-operatively Identify appropriate multivitamin and calcium sources Describe the need for physical activity post-operatively and will follow MD recommendations State when to call healthcare provider regarding medication questions or post-operative complications When having a diagnosis of diabetes understanding hypoglycemia symptoms and the inclusion of 1 complex carbohydrate per meal  Handouts given during class include: Pre-Op Bariatric Surgery Diet Handout Protein Shake Handout Post-Op Bariatric Surgery Nutrition Handout BELT Program Information Flyer Support Group Information Flyer [facility] Bariatric Supplements Price List  Follow-Up Plan: Patient will follow-up at [facility] 2 weeks post operatively for diet advancement per MD.

## 2022-08-25 DIAGNOSIS — F411 Generalized anxiety disorder: Secondary | ICD-10-CM | POA: Diagnosis not present

## 2022-08-25 DIAGNOSIS — E785 Hyperlipidemia, unspecified: Secondary | ICD-10-CM | POA: Diagnosis not present

## 2022-08-25 DIAGNOSIS — Z008 Encounter for other general examination: Secondary | ICD-10-CM | POA: Diagnosis not present

## 2022-08-25 DIAGNOSIS — L309 Dermatitis, unspecified: Secondary | ICD-10-CM | POA: Diagnosis not present

## 2022-08-25 DIAGNOSIS — G4733 Obstructive sleep apnea (adult) (pediatric): Secondary | ICD-10-CM | POA: Diagnosis not present

## 2022-08-25 DIAGNOSIS — I2510 Atherosclerotic heart disease of native coronary artery without angina pectoris: Secondary | ICD-10-CM | POA: Diagnosis not present

## 2022-08-25 DIAGNOSIS — M545 Low back pain, unspecified: Secondary | ICD-10-CM | POA: Diagnosis not present

## 2022-08-25 DIAGNOSIS — R32 Unspecified urinary incontinence: Secondary | ICD-10-CM | POA: Diagnosis not present

## 2022-08-25 DIAGNOSIS — F329 Major depressive disorder, single episode, unspecified: Secondary | ICD-10-CM | POA: Diagnosis not present

## 2022-08-25 DIAGNOSIS — R600 Localized edema: Secondary | ICD-10-CM | POA: Diagnosis not present

## 2022-08-25 DIAGNOSIS — J309 Allergic rhinitis, unspecified: Secondary | ICD-10-CM | POA: Diagnosis not present

## 2022-08-25 DIAGNOSIS — M1990 Unspecified osteoarthritis, unspecified site: Secondary | ICD-10-CM | POA: Diagnosis not present

## 2022-08-25 DIAGNOSIS — K219 Gastro-esophageal reflux disease without esophagitis: Secondary | ICD-10-CM | POA: Diagnosis not present

## 2022-08-30 LAB — FECAL OCCULT BLOOD, GUAIAC: Fecal Occult Blood: NEGATIVE

## 2022-08-31 ENCOUNTER — Telehealth (INDEPENDENT_AMBULATORY_CARE_PROVIDER_SITE_OTHER): Payer: Medicare HMO | Admitting: Family Medicine

## 2022-08-31 ENCOUNTER — Encounter: Payer: Self-pay | Admitting: Family Medicine

## 2022-08-31 DIAGNOSIS — Z01818 Encounter for other preprocedural examination: Secondary | ICD-10-CM | POA: Diagnosis not present

## 2022-08-31 NOTE — Patient Instructions (Signed)
DUE TO COVID-19 ONLY TWO VISITORS  (aged 70 and older)  ARE ALLOWED TO COME WITH YOU AND STAY IN THE WAITING ROOM ONLY DURING PRE OP AND PROCEDURE.   **NO VISITORS ARE ALLOWED IN THE SHORT STAY AREA OR RECOVERY ROOM!!**  IF YOU WILL BE ADMITTED INTO THE HOSPITAL YOU ARE ALLOWED ONLY FOUR SUPPORT PEOPLE DURING VISITATION HOURS ONLY (7 AM -8PM)   The support person(s) must pass our screening, gel in and out, and wear a mask at all times, including in the patient's room. Patients must also wear a mask when staff or their support person are in the room. Visitors GUEST BADGE MUST BE WORN VISIBLY  One adult visitor may remain with you overnight and MUST be in the room by 8 P.M.     Your procedure is scheduled on: 09/14/22   Report to [facility] Main Entrance    Report to admitting at : 11:30 AM   Call this number if you have problems the morning of surgery [phone]   Do not eat food :After 6 PM the day before surgery.   After Midnight you may have the following liquids until : 10:30 AM DAY OF SURGERY  Water Black Coffee (sugar ok, NO MILK/CREAM OR CREAMERS)  Tea (sugar ok, NO MILK/CREAM OR CREAMERS) regular and decaf                             Plain Jell-O (NO RED)                                           Fruit ices (not with fruit pulp, NO RED)                                     Popsicles (NO RED)                                                                  Juice: apple, WHITE grape, WHITE cranberry Sports drinks like Gatorade (NO RED)              The day of surgery:  Drink ONE (1) Pre-Surgery Clear G2 at : 10:30 AM the morning of surgery. Drink in one sitting. Do not sip.  This drink was given to you during your hospital  pre-op appointment visit. Nothing else to drink after completing the  Pre-Surgery Clear Ensure or G2.          If you have questions, please contact your surgeon's office.  MORNING OF SURGERY DRINK:   DRINK 1 G2 drink BEFORE YOU LEAVE HOME,  DRINK ALL OF THE  G2 DRINK AT ONE TIME.   NO SOLID FOOD AFTER 600 PM THE NIGHT BEFORE YOUR SURGERY. YOU MAY DRINK CLEAR FLUIDS. THE G2 DRINK YOU DRINK BEFORE YOU LEAVE HOME WILL BE THE LAST FLUIDS YOU DRINK BEFORE SURGERY.  PAIN IS EXPECTED AFTER SURGERY AND WILL NOT BE COMPLETELY ELIMINATED. AMBULATION AND TYLENOL WILL HELP REDUCE INCISIONAL AND GAS PAIN. MOVEMENT IS KEY!  YOU ARE EXPECTED  TO BE OUT OF BED WITHIN 4 HOURS OF ADMISSION TO YOUR PATIENT ROOM.  SITTING IN THE RECLINER THROUGHOUT THE DAY IS IMPORTANT FOR DRINKING FLUIDS AND MOVING GAS THROUGHOUT THE GI TRACT.  COMPRESSION STOCKINGS SHOULD BE WORN [facility] STAY UNLESS YOU ARE WALKING.   INCENTIVE SPIROMETER SHOULD BE USED EVERY HOUR WHILE AWAKE TO DECREASE POST-OPERATIVE COMPLICATIONS SUCH AS PNEUMONIA.  WHEN DISCHARGED HOME, IT IS IMPORTANT TO CONTINUE TO WALK EVERY HOUR AND USE THE INCENTIVE SPIROMETER EVERY HOUR.   Oral Hygiene is also important to reduce your risk of infection.                                    Remember - BRUSH YOUR TEETH THE MORNING OF SURGERY WITH YOUR REGULAR TOOTHPASTE  DENTURES WILL BE REMOVED PRIOR TO SURGERY PLEASE DO NOT APPLY "Poly grip" OR ADHESIVES!!!   Do NOT smoke after Midnight   Take these medicines the morning of surgery with A SIP OF WATER: duloxetine,metoprolol,esomeprazole.  Bring CPAP mask and tubing day of surgery.                              You may not have any metal on your body including hair pins, jewelry, and body piercing             Do not wear make-up, lotions, powders, perfumes/cologne, or deodorant  Do not wear nail polish including gel and S&S, artificial/acrylic nails, or any other type of covering on natural nails including finger and toenails. If you have artificial nails, gel coating, etc. that needs to be removed by a nail salon please have this removed prior to surgery or surgery may need to be canceled/ delayed if the surgeon/ anesthesia feels like  they are unable to be safely monitored.   Do not shave  48 hours prior to surgery.    Do not bring valuables to the hospital. [facility] IS NOT             RESPONSIBLE   FOR VALUABLES.   Contacts, glasses, or bridgework may not be worn into surgery.   Bring small overnight bag day of surgery.   DO NOT BRING YOUR HOME MEDICATIONS TO THE HOSPITAL. PHARMACY WILL DISPENSE MEDICATIONS LISTED ON YOUR MEDICATION LIST TO YOU DURING YOUR ADMISSION IN THE HOSPITAL!    Patients discharged on the day of surgery will not be allowed to drive home.  Someone NEEDS to stay with you for the first 24 hours after anesthesia.   Special Instructions: Bring a copy of your healthcare power of attorney and living will documents         the day of surgery if you haven't scanned them before.              Please read over the following fact sheets you were given: IF YOU HAVE QUESTIONS ABOUT YOUR PRE-OP INSTRUCTIONS PLEASE CALL [phone]    [facility] - Preparing for Surgery Before surgery, you can play an important role.  Because skin is not sterile, your skin needs to be as free of germs as possible.  You can reduce the number of germs on your skin by washing with CHG (chlorahexidine gluconate) soap before surgery.  CHG is an antiseptic cleaner which kills germs and bonds with the skin to continue killing germs even after washing. Please DO NOT  use if you have an allergy to CHG or antibacterial soaps.  If your skin becomes reddened/irritated stop using the CHG and inform your nurse when you arrive at Short Stay. Do not shave (including legs and underarms) for at least 48 hours prior to the first CHG shower.  You may shave your face/neck. Please follow these instructions carefully:  1.  Shower with CHG Soap the night before surgery and the  morning of Surgery.  2.  If you choose to wash your hair, wash your hair first as usual with your  normal  shampoo.  3.  After you shampoo, rinse your hair and body  thoroughly to remove the  shampoo.                           4.  Use CHG as you would any other liquid soap.  You can apply chg directly  to the skin and wash                       Gently with a scrungie or clean washcloth.  5.  Apply the CHG Soap to your body ONLY FROM THE NECK DOWN.   Do not use on face/ open                           Wound or open sores. Avoid contact with eyes, ears mouth and genitals (private parts).                       Wash face,  Genitals (private parts) with your normal soap.             6.  Wash thoroughly, paying special attention to the area where your surgery  will be performed.  7.  Thoroughly rinse your body with warm water from the neck down.  8.  DO NOT shower/wash with your normal soap after using and rinsing off  the CHG Soap.                9.  Pat yourself dry with a clean towel.            10.  Wear clean pajamas.            11.  Place clean sheets on your bed the night of your first shower and do not  sleep with pets. Day of Surgery : Do not apply any lotions/deodorants the morning of surgery.  Please wear clean clothes to the hospital/surgery center.  FAILURE TO FOLLOW THESE INSTRUCTIONS MAY RESULT IN THE CANCELLATION OF YOUR SURGERY PATIENT SIGNATURE_________________________________  NURSE SIGNATURE__________________________________  ________________________________________________________________________

## 2022-08-31 NOTE — Progress Notes (Signed)
Subjective:   Chief Complaint  Patient presents with   Pre-op Exam    Christine [name]  is here for a Pre-operative physical at the request of Dr. Dossie Der.   She  is having gastric bypass RNY surgery on 09/14/22 for morbid obesity. We are interacting via web portal for an electronic face-to-face visit. I verified patient's ID using 2 identifiers. Patient agreed to proceed with visit via this method. Patient is at home, I am at office. Patient and I are present for visit.   Personal or family hx of adverse outcome to anesthesia? No  Chipped, cracked, missing, or loose teeth? Yes; not missing her front teeth Decreased ROM of neck? No  Able to walk up 2 flights of stairs without becoming significantly short of breath or having chest pain? Yes  Revised Goldman Criteria: High Risk Surgery (intraperitoneal, intrathoracic, aortic): No  Ischemic heart disease (Prior MI, +excercise stress test, angina, nitrate use, Qwave): No  History of heart failure: No  History of cerebrovascular disease: No  History of diabetes: No  Insulin therapy for DM: No  Preoperative Cr >2.0: No    Patient Active Problem List   Diagnosis Date Noted   Gastric polyp 04/08/2022   Hyperlipidemia 05/20/2021   Lumbar radiculopathy 12/24/2020   Trigger little finger of left hand 12/16/2020   Medial epicondylitis of elbow, left 12/16/2020   Memory difficulties 12/11/2020   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   Globus sensation 11/15/2019   Gastroesophageal reflux disease 11/15/2019   Throat clearing 10/05/2019   Gout 08/31/2019   Phlebitis 08/31/2019   Prediabetes 08/31/2019   OA (osteoarthritis) of knee 05/17/2019   Acute pain of right knee 05/17/2019   Abdominal pain 03/31/2019   Morbid obesity (HCC) 03/29/2018   GAD (generalized anxiety disorder) 02/27/2018   Mixed incontinence urge and stress 02/27/2018   OSA on CPAP 06/16/2017   Acute appendicitis 05/06/2016   Gastro-esophageal reflux  disease with esophagitis 07/26/2011   Essential hypertension 07/26/2011   Osteoarthrosis 10/16/2009   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, including shoulder 08/22/2008   Pre-operative cardiovascular examination 08/22/2008   Anxiety 04/27/2003   Past Medical History:  Diagnosis Date   Allergy    dust, pollen, sulfa, prednisone   Anemia    Anxiety    Arthritis    "knees" (04/26/2016)   Colon polyps    benign per pt   Coronary artery disease    mild per 2015 cath in [place] (OM1 30%, RCA 30%)   GERD (gastroesophageal reflux disease)    Gout    History of hiatal hernia    Hyperlipidemia 05/20/2021   Hypertension    Migraine    "none since early /2017" (05/06/2016)   Obesity    OSA on CPAP    uses CPAP   Pre-operative cardiovascular examination 08/22/2008    Past Surgical History:  Procedure Laterality Date   ABDOMINAL HYSTERECTOMY     "partial"; both ovaries present   APPENDECTOMY  05/06/2016   BIOPSY  04/08/2022   Procedure: BIOPSY;  Surgeon: Sherrilyn Rist, MD;  Location: [facility];  Service: Gastroenterology;;   CARDIAC CATHETERIZATION  ~ 2015   CARDIAC CATHETERIZATION  2015   In [place]   CHILECTOMY Right 06/01/2017   Procedure: CHILECTOMY RIGHT FOOT;  Surgeon: Felecia Shelling, DPM;  Location: [facility];  Service: Podiatry;  Laterality: Right;   ESOPHAGOGASTRODUODENOSCOPY (EGD) WITH PROPOFOL N/A 04/08/2022   Procedure: ESOPHAGOGASTRODUODENOSCOPY (EGD) WITH PROPOFOL;  Surgeon: Sherrilyn Rist, MD;  Location: [facility];  Service: Gastroenterology;  Laterality: N/A;   LAPAROSCOPIC APPENDECTOMY N/A 05/06/2016   Procedure: LAPAROSCOPIC APPENDECTOMY;  Surgeon: Manus Rudd, MD;  Location: [facility];  Service: General;  Laterality: N/A;   POLYPECTOMY  04/08/2022   Procedure: POLYPECTOMY;  Surgeon: Sherrilyn Rist, MD;  Location: [facility];  Service: Gastroenterology;;   TONSILLECTOMY      Current Outpatient Medications   Medication Sig Dispense Refill   aspirin 81 MG EC tablet Take 81 mg by mouth daily.     augmented betamethasone dipropionate (DIPROLENE-AF) 0.05 % cream Apply 1 Application topically 2 (two) times daily.     DULoxetine (CYMBALTA) 60 MG capsule Take 1 capsule (60 mg total) by mouth daily. 90 capsule 1   esomeprazole (NEXIUM) 40 MG capsule Take 1 capsule (40 mg total) by mouth daily. 90 capsule 1   ferrous sulfate 325 (65 FE) MG EC tablet Take 325 mg by mouth daily with breakfast.     ibuprofen (ADVIL) 800 MG tablet TAKE 1 TABLET BY MOUTH EVERY 8 HOURS AS NEEDED 90 tablet 0   metoprolol succinate (TOPROL-XL) 50 MG 24 hr tablet Take 1 tablet (50 mg total) by mouth daily. Take with or immediately following a meal. 90 tablet 3   oxybutynin (DITROPAN XL) 15 MG 24 hr tablet TAKE ONE TABLET BY MOUTH EVERY NIGHT AT BEDTIME 30 tablet 11   potassium chloride (KLOR-CON) 10 MEQ tablet Take 1 tablet (10 mEq total) by mouth daily. (Patient not taking: Reported on 08/30/2022) 3 tablet 0   rosuvastatin (CRESTOR) 10 MG tablet Take 1 tablet (10 mg total) by mouth daily. 90 tablet 3   triamterene-hydrochlorothiazide (MAXZIDE) 75-50 MG tablet Take 1 tablet by mouth daily. 30 tablet 8   Allergies  Allergen Reactions   Prednisone Swelling    Legs swell    Tape Hives   Sulfa Antibiotics Rash    Family History  Problem Relation Age of Onset   Diabetes Mother    Heart disease Mother    High blood pressure Mother    Asthma Mother    Arthritis Mother    Diabetes Father    Heart disease Father    High blood pressure Father    Asthma Father    Diabetes Sister    Alcohol abuse Brother    Drug abuse Brother    Arthritis Sister    Diabetes Sister    Varicose Veins Sister    Colon cancer Neg Hx    Esophageal cancer Neg Hx    Rectal cancer Neg Hx      Review of Systems:  Constitutional:  no fevers Eye:  no recent significant change in vision Ear:  no hearing loss Nose/Mouth/Throat:  No dental  complaints Neck/Thyroid:  no lumps or masses Pulmonary:  No shortness of breath Cardiovascular:  no chest pain Gastrointestinal:  no abdominal pain GU:  negative for dysuria Musculoskeletal/Extremities:  no new pain Skin/Integumentary ROS:  no new abnormal skin lesions reported Neurologic:  no HA   Objective:  No conversational dyspnea Age appropriate judgment and insight Nml affect and mood  Assessment:   Pre-op exam   Plan:   Stop aspirin/ibuprofen/NSAIDs 5 days before surgery. Will use Tylenol instead and let us know if there are issues.  The patient is deemed low cardiac risk for the proposed procedure.  The patient voiced understanding and agreement to the plan.  Jilda Roche Harrison, DO 08/31/22  11:08 AM

## 2022-09-01 ENCOUNTER — Encounter (HOSPITAL_COMMUNITY): Payer: Self-pay

## 2022-09-01 ENCOUNTER — Encounter (HOSPITAL_COMMUNITY)
Admission: RE | Admit: 2022-09-01 | Discharge: 2022-09-01 | Disposition: A | Discharge status: Home / Self Care | Payer: Medicare HMO | Source: Ambulatory Visit | Attending: Surgery | Admitting: Surgery

## 2022-09-01 ENCOUNTER — Other Ambulatory Visit: Payer: Self-pay

## 2022-09-01 VITALS — BP 156/65 | HR 77 | Temp 97.6°F | Ht 61.5 in | Wt 285.0 lb

## 2022-09-01 DIAGNOSIS — R7303 Prediabetes: Secondary | ICD-10-CM | POA: Diagnosis not present

## 2022-09-01 DIAGNOSIS — K219 Gastro-esophageal reflux disease without esophagitis: Secondary | ICD-10-CM | POA: Diagnosis not present

## 2022-09-01 DIAGNOSIS — G4733 Obstructive sleep apnea (adult) (pediatric): Secondary | ICD-10-CM | POA: Diagnosis not present

## 2022-09-01 DIAGNOSIS — I10 Essential (primary) hypertension: Secondary | ICD-10-CM | POA: Diagnosis not present

## 2022-09-01 DIAGNOSIS — Z87891 Personal history of nicotine dependence: Secondary | ICD-10-CM | POA: Insufficient documentation

## 2022-09-01 DIAGNOSIS — I2510 Atherosclerotic heart disease of native coronary artery without angina pectoris: Secondary | ICD-10-CM | POA: Diagnosis not present

## 2022-09-01 DIAGNOSIS — Z01818 Encounter for other preprocedural examination: Secondary | ICD-10-CM

## 2022-09-01 DIAGNOSIS — Z01812 Encounter for preprocedural laboratory examination: Secondary | ICD-10-CM | POA: Insufficient documentation

## 2022-09-01 HISTORY — DX: Arteritis, unspecified: I77.6

## 2022-09-01 LAB — CBC WITH DIFFERENTIAL/PLATELET
Abs Immature Granulocytes: 0.14 10*3/uL — ABNORMAL HIGH (ref 0.00–0.07)
Basophils Absolute: 0.1 10*3/uL (ref 0.0–0.1)
Basophils Relative: 1 %
Eosinophils Absolute: 0.2 10*3/uL (ref 0.0–0.5)
Eosinophils Relative: 3 %
HCT: 35.2 % — ABNORMAL LOW (ref 36.0–46.0)
Hemoglobin: 10.5 g/dL — ABNORMAL LOW (ref 12.0–15.0)
Immature Granulocytes: 2 %
Lymphocytes Relative: 47 %
Lymphs Abs: 3.2 10*3/uL (ref 0.7–4.0)
MCH: 26.6 pg (ref 26.0–34.0)
MCHC: 29.8 g/dL — ABNORMAL LOW (ref 30.0–36.0)
MCV: 89.1 fL (ref 80.0–100.0)
Monocytes Absolute: 0.5 10*3/uL (ref 0.1–1.0)
Monocytes Relative: 8 %
Neutro Abs: 2.6 10*3/uL (ref 1.7–7.7)
Neutrophils Relative %: 39 %
Platelets: 266 10*3/uL (ref 150–400)
RBC: 3.95 MIL/uL (ref 3.87–5.11)
RDW: 15.8 % — ABNORMAL HIGH (ref 11.5–15.5)
WBC: 6.7 10*3/uL (ref 4.0–10.5)
nRBC: 0 % (ref 0.0–0.2)

## 2022-09-01 LAB — TYPE AND SCREEN
ABO/RH(D): O POS
Antibody Screen: NEGATIVE

## 2022-09-01 LAB — COMPREHENSIVE METABOLIC PANEL
ALT: 16 U/L (ref 0–44)
AST: 17 U/L (ref 15–41)
Albumin: 3.5 g/dL (ref 3.5–5.0)
Alkaline Phosphatase: 70 U/L (ref 38–126)
Anion gap: 7 (ref 5–15)
BUN: 26 mg/dL — ABNORMAL HIGH (ref 8–23)
CO2: 22 mmol/L (ref 22–32)
Calcium: 8.8 mg/dL — ABNORMAL LOW (ref 8.9–10.3)
Chloride: 112 mmol/L — ABNORMAL HIGH (ref 98–111)
Creatinine, Ser: 0.91 mg/dL (ref 0.44–1.00)
GFR, Estimated: >60 mL/min (ref >60)
Glucose, Bld: 101 mg/dL — ABNORMAL HIGH (ref 70–99)
Potassium: 3.8 mmol/L (ref 3.5–5.1)
Sodium: 141 mmol/L (ref 135–145)
Total Bilirubin: 0.7 mg/dL (ref 0.3–1.2)
Total Protein: 7.2 g/dL (ref 6.5–8.1)

## 2022-09-01 LAB — HEMOGLOBIN A1C
Hgb A1c MFr Bld: 6.3 % — ABNORMAL HIGH (ref 4.8–5.6)
Mean Plasma Glucose: 134.11 mg/dL

## 2022-09-01 LAB — GLUCOSE, CAPILLARY: Glucose-Capillary: 127 mg/dL — ABNORMAL HIGH (ref 70–99)

## 2022-09-01 NOTE — Progress Notes (Signed)
For Short Stay: COVID SWAB appointment date:  Bowel Prep reminder:   For Anesthesia: PCP - DO: Radene Gunning. LOV: 07/14/22 Cardiologist - N/A  Chest x-ray - 01/13/22 EKG - 01/12/22 Stress Test -  ECHO - 12/03/19 Cardiac Cath - 2015 Pacemaker/ICD device last checked: Pacemaker orders received: Device Rep notified:  Spinal Cord Stimulator: N/A  Sleep Study - Yes CPAP - Yes  Fasting Blood Sugar - N/A Checks Blood Sugar ___0__ times a day Date and result of last Hgb A1c-  Last dose of GLP1 agonist- N/A GLP1 instructions:   Last dose of SGLT-2 inhibitors- N/A SGLT-2 instructions:   Blood Thinner Instructions: Aspirin Instructions: To hold it 5 days before surgery. Last Dose:  Activity level: Can go up a flight of stairs and activities of daily living without stopping and without chest pain and/or shortness of breath   Able to exercise without chest pain and/or shortness of breath  Anesthesia review: Hx: HTN,Pre-DIA,CAD,OSA(CPAP).  Patient denies shortness of breath, fever, cough and chest pain at PAT appointment   Patient verbalized understanding of instructions that were given to them at the PAT appointment. Patient was also instructed that they will need to review over the PAT instructions again at home before surgery.

## 2022-09-03 NOTE — Progress Notes (Signed)
Anesthesia chart review   Case: [id number] Date/Time: 09/14/22 1145   Procedure: ROBOTIC GASTRIC BYPASS RNY, UPPER GASTROINTESTINAL ENDOSCOPY   Anesthesia type: General   Pre-op diagnosis: MORBID OBESITY   Location: [facility] / [facility]   Surgeons: Quentin Ore, MD       DISCUSSION: 72 year old former smoker with history of HTN, GERD, OSA on CPAP, CAD mild per cath 2015, morbid obesity scheduled for above procedure 09/05/2022 with Dr. Ivar Drape.  Patient seen by PCP 08/31/2022.  Per office visit note, "The patient is deemed low cardiac risk for the proposed procedure."  Cardiac cath 02/27/2014 (in Care Everywhere) LM: Smooth and free of significant disease LAD: Minimal luminal irregularities throughout.  D1 is a medium caliber vessel which is smooth and has minimal luminal irregularities.  The remainder of the diagonal vessels are small in caliber. LCx: Minimal luminal irregularities.  OM1 proximal 30% eccentric narrowing.  OM 2 is small.  OM 3 has minimal luminal irregularities. RCA: Proximal eccentric 30% narrowing Recommendations: 1.  Medical therapy and risk factor modification with focus on blood pressure control.  Anticipate pt can proceed with planned procedure barring acute status change.  VS: BP (!) 156/65   Pulse 77   Temp 36.4 C (Oral)   Ht 5' 1.5" (1.562 m)   Wt 129.3 kg   SpO2 95%   BMI 52.98 kg/m   PROVIDERS: [name], DO   LABS: Labs reviewed: Acceptable for surgery. (all labs ordered are listed, but only abnormal results are displayed)  Labs Reviewed  CBC WITH DIFFERENTIAL/PLATELET - Abnormal; Notable for the following components:      Result Value   Hemoglobin 10.5 (*)    HCT 35.2 (*)    MCHC 29.8 (*)    RDW 15.8 (*)    Abs Immature Granulocytes 0.14 (*)    All other components within normal limits  COMPREHENSIVE METABOLIC PANEL - Abnormal; Notable for the following components:   Chloride 112 (*)    Glucose, Bld 101 (*)     BUN 26 (*)    Calcium 8.8 (*)    All other components within normal limits  HEMOGLOBIN A1C - Abnormal; Notable for the following components:   Hgb A1c MFr Bld 6.3 (*)    All other components within normal limits  GLUCOSE, CAPILLARY - Abnormal; Notable for the following components:   Glucose-Capillary 127 (*)    All other components within normal limits  TYPE AND SCREEN     IMAGES:   EKG:   CV:  Past Medical History:  Diagnosis Date   Allergy    dust, pollen, sulfa, prednisone   Anemia    Anxiety    Arthritis    "knees" (04/26/2016)   Colon polyps    benign per pt   Coronary artery disease    mild per 2015 cath in [place] (OM1 30%, RCA 30%)   GERD (gastroesophageal reflux disease)    Gout    History of hiatal hernia    Hyperlipidemia 05/20/2021   Hypertension    Migraine    "none since early /2017" (05/06/2016)   Obesity    OSA on CPAP    uses CPAP   Pre-diabetes    Pre-operative cardiovascular examination 08/22/2008   Vasculitis (HCC)    Bilateral    Past Surgical History:  Procedure Laterality Date   ABDOMINAL HYSTERECTOMY     "partial"; both ovaries present   APPENDECTOMY  05/06/2016   BIOPSY  04/08/2022  Procedure: BIOPSY;  Surgeon: Sherrilyn Rist, MD;  Location: [facility];  Service: Gastroenterology;;   CARDIAC CATHETERIZATION  ~ 2015   CARDIAC CATHETERIZATION  2015   In [place]   CHILECTOMY Right 06/01/2017   Procedure: CHILECTOMY RIGHT FOOT;  Surgeon: Felecia Shelling, DPM;  [facility];  Service: Podiatry;  Laterality: Right;   ESOPHAGOGASTRODUODENOSCOPY (EGD) WITH PROPOFOL N/A 04/08/2022   Procedure: ESOPHAGOGASTRODUODENOSCOPY (EGD) WITH PROPOFOL;  Surgeon: Sherrilyn Rist, MD;  [facility];  Service: Gastroenterology;  Laterality: N/A;   LAPAROSCOPIC APPENDECTOMY N/A 05/06/2016   Procedure: LAPAROSCOPIC APPENDECTOMY;  Surgeon: Manus Rudd, MD;  [facility];  Service: General;  Laterality: N/A;   POLYPECTOMY   04/08/2022   Procedure: POLYPECTOMY;  Surgeon: Sherrilyn Rist, MD;  Location: [facility];  Service: Gastroenterology;;   TONSILLECTOMY      MEDICATIONS:  aspirin 81 MG EC tablet   augmented betamethasone dipropionate (DIPROLENE-AF) 0.05 % cream   DULoxetine (CYMBALTA) 60 MG capsule   esomeprazole (NEXIUM) 40 MG capsule   ferrous sulfate 325 (65 FE) MG EC tablet   ibuprofen (ADVIL) 800 MG tablet   metoprolol succinate (TOPROL-XL) 50 MG 24 hr tablet   oxybutynin (DITROPAN XL) 15 MG 24 hr tablet   potassium chloride (KLOR-CON) 10 MEQ tablet   rosuvastatin (CRESTOR) 10 MG tablet   triamterene-hydrochlorothiazide (MAXZIDE) 75-50 MG tablet   No current facility-administered medications for this encounter.   Jodell Cipro Ward, PA-C [facility] [phone]

## 2022-09-03 NOTE — Anesthesia Preprocedure Evaluation (Addendum)
Anesthesia Evaluation  Patient identified by MRN, date of birth, ID band Patient awake    Reviewed: Allergy & Precautions, NPO status , Patient's Chart, lab work & pertinent test results, reviewed documented beta blocker date and time   History of Anesthesia Complications Negative for: history of anesthetic complications  Airway Mallampati: II  TM Distance: >3 FB Neck ROM: Full    Dental  (+) Chipped, Dental Advisory Given, Missing   Pulmonary sleep apnea and Continuous Positive Airway Pressure Ventilation , former smoker   breath sounds clear to auscultation       Cardiovascular hypertension, Pt. on medications and Pt. on home beta blockers (-) angina + CAD ('15 cath: mild, non-obstructive)   Rhythm:Regular Rate:Normal     Neuro/Psych  Headaches  Anxiety        GI/Hepatic Neg liver ROS, hiatal hernia,GERD  Medicated and Controlled,,  Endo/Other  BMI 53.8  Renal/GU negative Renal ROS     Musculoskeletal  (+) Arthritis ,    Abdominal  (+) + obese  Peds  Hematology  (+) Blood dyscrasia (Hb 10.5), anemia   Anesthesia Other Findings   Reproductive/Obstetrics                             Anesthesia Physical Anesthesia Plan  ASA: 3  Anesthesia Plan: General   Post-op Pain Management: Tylenol PO (pre-op)*   Induction: Intravenous  PONV Risk Score and Plan: 3 and Ondansetron, Dexamethasone and Treatment may vary due to age or medical condition  Airway Management Planned: Oral ETT  Additional Equipment: None  Intra-op Plan:   Post-operative Plan: Extubation in OR  Informed Consent: I have reviewed the patients History and Physical, chart, labs and discussed the procedure including the risks, benefits and alternatives for the proposed anesthesia with the patient or authorized representative who has indicated his/her understanding and acceptance.     Dental advisory given  Plan  Discussed with: CRNA and Surgeon  Anesthesia Plan Comments: (See PAT note 09/01/2022)       Anesthesia Quick Evaluation

## 2022-09-05 ENCOUNTER — Encounter: Payer: Self-pay | Admitting: Family Medicine

## 2022-09-06 ENCOUNTER — Other Ambulatory Visit: Payer: Self-pay | Admitting: Family Medicine

## 2022-09-06 DIAGNOSIS — F330 Major depressive disorder, recurrent, mild: Secondary | ICD-10-CM | POA: Diagnosis not present

## 2022-09-06 MED ORDER — METOPROLOL TARTRATE 50 MG PO TABS: 50.0000 mg | ORAL_TABLET | Freq: Two times a day (BID) | ORAL | 3 refills | Status: DC

## 2022-09-09 ENCOUNTER — Other Ambulatory Visit: Payer: Self-pay | Admitting: Family Medicine

## 2022-09-09 DIAGNOSIS — N3946 Mixed incontinence: Secondary | ICD-10-CM

## 2022-09-13 ENCOUNTER — Ambulatory Visit: Payer: Medicare HMO | Admitting: Family Medicine

## 2022-09-14 ENCOUNTER — Other Ambulatory Visit: Payer: Self-pay

## 2022-09-14 ENCOUNTER — Encounter (HOSPITAL_COMMUNITY)
Admission: RE | Disposition: A | Discharge status: Home / Self Care | Payer: Self-pay | Source: Ambulatory Visit | Attending: Surgery

## 2022-09-14 ENCOUNTER — Inpatient Hospital Stay (HOSPITAL_COMMUNITY)
Admission: RE | Admit: 2022-09-14 | Discharge: 2022-09-16 | DRG: 621 | Disposition: A | Discharge status: Home / Self Care | Payer: Medicare HMO | Source: Ambulatory Visit | Attending: Surgery | Admitting: Surgery

## 2022-09-14 ENCOUNTER — Encounter (HOSPITAL_COMMUNITY): Payer: Self-pay | Admitting: Surgery

## 2022-09-14 ENCOUNTER — Ambulatory Visit: Payer: Self-pay | Admitting: Surgery

## 2022-09-14 ENCOUNTER — Inpatient Hospital Stay (HOSPITAL_COMMUNITY): Payer: Medicare HMO | Admitting: Certified Registered Nurse Anesthetist

## 2022-09-14 ENCOUNTER — Inpatient Hospital Stay (HOSPITAL_COMMUNITY): Payer: Medicare HMO | Admitting: Physician Assistant

## 2022-09-14 DIAGNOSIS — I10 Essential (primary) hypertension: Secondary | ICD-10-CM | POA: Diagnosis not present

## 2022-09-14 DIAGNOSIS — Z813 Family history of other psychoactive substance abuse and dependence: Secondary | ICD-10-CM | POA: Diagnosis not present

## 2022-09-14 DIAGNOSIS — Z87891 Personal history of nicotine dependence: Secondary | ICD-10-CM

## 2022-09-14 DIAGNOSIS — Z825 Family history of asthma and other chronic lower respiratory diseases: Secondary | ICD-10-CM

## 2022-09-14 DIAGNOSIS — Z888 Allergy status to other drugs, medicaments and biological substances status: Secondary | ICD-10-CM

## 2022-09-14 DIAGNOSIS — Z86010 Personal history of colonic polyps: Secondary | ICD-10-CM

## 2022-09-14 DIAGNOSIS — Z91048 Other nonmedicinal substance allergy status: Secondary | ICD-10-CM

## 2022-09-14 DIAGNOSIS — Z79899 Other long term (current) drug therapy: Secondary | ICD-10-CM | POA: Diagnosis not present

## 2022-09-14 DIAGNOSIS — Z811 Family history of alcohol abuse and dependence: Secondary | ICD-10-CM

## 2022-09-14 DIAGNOSIS — K317 Polyp of stomach and duodenum: Principal | ICD-10-CM

## 2022-09-14 DIAGNOSIS — D649 Anemia, unspecified: Secondary | ICD-10-CM | POA: Diagnosis not present

## 2022-09-14 DIAGNOSIS — M170 Bilateral primary osteoarthritis of knee: Secondary | ICD-10-CM | POA: Diagnosis not present

## 2022-09-14 DIAGNOSIS — I2510 Atherosclerotic heart disease of native coronary artery without angina pectoris: Secondary | ICD-10-CM | POA: Diagnosis not present

## 2022-09-14 DIAGNOSIS — M109 Gout, unspecified: Secondary | ICD-10-CM | POA: Diagnosis present

## 2022-09-14 DIAGNOSIS — Z8249 Family history of ischemic heart disease and other diseases of the circulatory system: Secondary | ICD-10-CM

## 2022-09-14 DIAGNOSIS — Z9884 Bariatric surgery status: Secondary | ICD-10-CM | POA: Insufficient documentation

## 2022-09-14 DIAGNOSIS — Z90710 Acquired absence of both cervix and uterus: Secondary | ICD-10-CM | POA: Diagnosis not present

## 2022-09-14 DIAGNOSIS — E785 Hyperlipidemia, unspecified: Secondary | ICD-10-CM | POA: Diagnosis present

## 2022-09-14 DIAGNOSIS — K219 Gastro-esophageal reflux disease without esophagitis: Secondary | ICD-10-CM | POA: Diagnosis present

## 2022-09-14 DIAGNOSIS — Z8261 Family history of arthritis: Secondary | ICD-10-CM

## 2022-09-14 DIAGNOSIS — Z6841 Body Mass Index (BMI) 40.0 and over, adult: Secondary | ICD-10-CM

## 2022-09-14 DIAGNOSIS — Z7982 Long term (current) use of aspirin: Secondary | ICD-10-CM | POA: Diagnosis not present

## 2022-09-14 DIAGNOSIS — F419 Anxiety disorder, unspecified: Secondary | ICD-10-CM | POA: Diagnosis not present

## 2022-09-14 DIAGNOSIS — Z833 Family history of diabetes mellitus: Secondary | ICD-10-CM

## 2022-09-14 DIAGNOSIS — K449 Diaphragmatic hernia without obstruction or gangrene: Secondary | ICD-10-CM | POA: Diagnosis present

## 2022-09-14 DIAGNOSIS — G4733 Obstructive sleep apnea (adult) (pediatric): Secondary | ICD-10-CM | POA: Diagnosis present

## 2022-09-14 DIAGNOSIS — R7303 Prediabetes: Secondary | ICD-10-CM | POA: Diagnosis present

## 2022-09-14 DIAGNOSIS — Z882 Allergy status to sulfonamides status: Secondary | ICD-10-CM

## 2022-09-14 DIAGNOSIS — Z01818 Encounter for other preprocedural examination: Secondary | ICD-10-CM

## 2022-09-14 DIAGNOSIS — M1990 Unspecified osteoarthritis, unspecified site: Secondary | ICD-10-CM | POA: Diagnosis not present

## 2022-09-14 LAB — GLUCOSE, CAPILLARY: Glucose-Capillary: 90 mg/dL (ref 70–99)

## 2022-09-14 LAB — CBC
HCT: 32.9 % — ABNORMAL LOW (ref 36.0–46.0)
Hemoglobin: 10 g/dL — ABNORMAL LOW (ref 12.0–15.0)
MCH: 27 pg (ref 26.0–34.0)
MCHC: 30.4 g/dL (ref 30.0–36.0)
MCV: 88.9 fL (ref 80.0–100.0)
Platelets: 250 10*3/uL (ref 150–400)
RBC: 3.7 MIL/uL — ABNORMAL LOW (ref 3.87–5.11)
RDW: 15.9 % — ABNORMAL HIGH (ref 11.5–15.5)
WBC: 4.3 10*3/uL (ref 4.0–10.5)
nRBC: 0 % (ref 0.0–0.2)

## 2022-09-14 LAB — ABO/RH: ABO/RH(D): O POS

## 2022-09-14 LAB — HEMOGLOBIN AND HEMATOCRIT, BLOOD
HCT: 31.4 % — ABNORMAL LOW (ref 36.0–46.0)
Hemoglobin: 9.6 g/dL — ABNORMAL LOW (ref 12.0–15.0)

## 2022-09-14 LAB — CREATININE, SERUM
Creatinine, Ser: 1.04 mg/dL — ABNORMAL HIGH (ref 0.44–1.00)
GFR, Estimated: 58 mL/min — ABNORMAL LOW (ref >60)

## 2022-09-14 SURGERY — CREATION, GASTRIC BYPASS, ROUX-EN-Y, ROBOT-ASSISTED
Anesthesia: General

## 2022-09-14 MED ORDER — PROMETHAZINE HCL 25 MG/ML IJ SOLN: 6.2500 mg | INTRAMUSCULAR | Status: DC | PRN

## 2022-09-14 MED ORDER — MIDAZOLAM HCL 2 MG/2ML IJ SOLN: 0.5000 mg | Freq: Once | INTRAMUSCULAR | Status: DC | PRN

## 2022-09-14 MED ORDER — LACTATED RINGERS IR SOLN
Status: DC | PRN
  Administered 2022-09-14: 1000 mL

## 2022-09-14 MED ORDER — HYDROMORPHONE HCL 1 MG/ML IJ SOLN
INTRAMUSCULAR | Status: AC
  Filled 2022-09-14: qty 2

## 2022-09-14 MED ORDER — LACTATED RINGERS IV SOLN: INTRAVENOUS | Status: DC

## 2022-09-14 MED ORDER — FENTANYL CITRATE (PF) 100 MCG/2ML IJ SOLN
INTRAMUSCULAR | Status: AC
  Filled 2022-09-14: qty 2

## 2022-09-14 MED ORDER — PROPOFOL 10 MG/ML IV BOLUS
INTRAVENOUS | Status: DC | PRN
  Administered 2022-09-14: 200 mg via INTRAVENOUS

## 2022-09-14 MED ORDER — BUPIVACAINE LIPOSOME 1.3 % IJ SUSP: 20.0000 mL | Freq: Once | INTRAMUSCULAR | Status: DC

## 2022-09-14 MED ORDER — HEPARIN SODIUM (PORCINE) 5000 UNIT/ML IJ SOLN
5000.0000 [IU] | INTRAMUSCULAR | Status: AC
  Administered 2022-09-14: 5000 [IU] via SUBCUTANEOUS
  Filled 2022-09-14: qty 1

## 2022-09-14 MED ORDER — ONDANSETRON HCL 4 MG/2ML IJ SOLN
INTRAMUSCULAR | Status: DC | PRN
  Administered 2022-09-14: 4 mg via INTRAVENOUS

## 2022-09-14 MED ORDER — CHLORHEXIDINE GLUCONATE CLOTH 2 % EX PADS: 6.0000 | MEDICATED_PAD | Freq: Once | CUTANEOUS | Status: DC

## 2022-09-14 MED ORDER — 0.9 % SODIUM CHLORIDE (POUR BTL) OPTIME
TOPICAL | Status: DC | PRN
  Administered 2022-09-14: 1000 mL

## 2022-09-14 MED ORDER — ACETAMINOPHEN 160 MG/5ML PO SOLN: 1000.0000 mg | Freq: Three times a day (TID) | ORAL | Status: DC

## 2022-09-14 MED ORDER — OXYBUTYNIN CHLORIDE ER 5 MG PO TB24
15.0000 mg | ORAL_TABLET | Freq: Every day | ORAL | Status: DC
  Administered 2022-09-14 – 2022-09-15 (×2): 15 mg via ORAL
  Filled 2022-09-14 (×2): qty 3

## 2022-09-14 MED ORDER — ACETAMINOPHEN 500 MG PO TABS
1000.0000 mg | ORAL_TABLET | Freq: Three times a day (TID) | ORAL | Status: DC
  Administered 2022-09-14 – 2022-09-15 (×4): 1000 mg via ORAL
  Filled 2022-09-14 (×5): qty 2

## 2022-09-14 MED ORDER — OXYCODONE HCL 5 MG/5ML PO SOLN
5.0000 mg | Freq: Four times a day (QID) | ORAL | Status: DC | PRN
  Administered 2022-09-14: 5 mg via ORAL
  Filled 2022-09-14 (×4): qty 5

## 2022-09-14 MED ORDER — SODIUM CHLORIDE 0.9 % IV SOLN
2.0000 g | INTRAVENOUS | Status: AC
  Administered 2022-09-14: 2 g via INTRAVENOUS
  Filled 2022-09-14: qty 2

## 2022-09-14 MED ORDER — MORPHINE SULFATE (PF) 2 MG/ML IV SOLN: 1.0000 mg | INTRAVENOUS | Status: DC | PRN

## 2022-09-14 MED ORDER — HYDROMORPHONE HCL 1 MG/ML IJ SOLN
0.2500 mg | INTRAMUSCULAR | Status: DC | PRN
  Administered 2022-09-14: 0.5 mg via INTRAVENOUS

## 2022-09-14 MED ORDER — ONDANSETRON HCL 4 MG/2ML IJ SOLN
4.0000 mg | INTRAMUSCULAR | Status: DC | PRN
  Administered 2022-09-14 – 2022-09-15 (×2): 4 mg via INTRAVENOUS
  Filled 2022-09-14 (×4): qty 2

## 2022-09-14 MED ORDER — ACETAMINOPHEN 500 MG PO TABS
1000.0000 mg | ORAL_TABLET | ORAL | Status: AC
  Administered 2022-09-14: 1000 mg via ORAL
  Filled 2022-09-14: qty 2

## 2022-09-14 MED ORDER — DEXAMETHASONE SODIUM PHOSPHATE 10 MG/ML IJ SOLN
INTRAMUSCULAR | Status: AC
  Filled 2022-09-14: qty 1

## 2022-09-14 MED ORDER — PHENYLEPHRINE 80 MCG/ML (10ML) SYRINGE FOR IV PUSH (FOR BLOOD PRESSURE SUPPORT)
PREFILLED_SYRINGE | INTRAVENOUS | Status: DC | PRN
  Administered 2022-09-14: 80 ug via INTRAVENOUS

## 2022-09-14 MED ORDER — MIDAZOLAM HCL 5 MG/5ML IJ SOLN
INTRAMUSCULAR | Status: DC | PRN
  Administered 2022-09-14: 2 mg via INTRAVENOUS

## 2022-09-14 MED ORDER — DEXAMETHASONE SODIUM PHOSPHATE 10 MG/ML IJ SOLN
INTRAMUSCULAR | Status: DC | PRN
  Administered 2022-09-14: 5 mg via INTRAVENOUS

## 2022-09-14 MED ORDER — ORAL CARE MOUTH RINSE: 15.0000 mL | Freq: Once | OROMUCOSAL | Status: AC

## 2022-09-14 MED ORDER — OXYCODONE HCL 5 MG PO TABS: 5.0000 mg | ORAL_TABLET | Freq: Once | ORAL | Status: DC | PRN

## 2022-09-14 MED ORDER — METOPROLOL TARTRATE 50 MG PO TABS
50.0000 mg | ORAL_TABLET | Freq: Two times a day (BID) | ORAL | Status: DC
  Administered 2022-09-14 – 2022-09-16 (×4): 50 mg via ORAL
  Filled 2022-09-14 (×4): qty 1

## 2022-09-14 MED ORDER — STERILE WATER FOR IRRIGATION IR SOLN
Status: DC | PRN
  Administered 2022-09-14: 1000 mL

## 2022-09-14 MED ORDER — OXYCODONE HCL 5 MG/5ML PO SOLN: 5.0000 mg | Freq: Once | ORAL | Status: DC | PRN

## 2022-09-14 MED ORDER — APREPITANT 40 MG PO CAPS
40.0000 mg | ORAL_CAPSULE | ORAL | Status: AC
  Administered 2022-09-14: 40 mg via ORAL
  Filled 2022-09-14: qty 1

## 2022-09-14 MED ORDER — ROSUVASTATIN CALCIUM 10 MG PO TABS
10.0000 mg | ORAL_TABLET | Freq: Every day | ORAL | Status: DC
  Administered 2022-09-14 – 2022-09-16 (×3): 10 mg via ORAL
  Filled 2022-09-14 (×3): qty 1

## 2022-09-14 MED ORDER — DEXMEDETOMIDINE HCL IN NACL 80 MCG/20ML IV SOLN
INTRAVENOUS | Status: DC | PRN
  Administered 2022-09-14: 8 ug via INTRAVENOUS

## 2022-09-14 MED ORDER — ENSURE MAX PROTEIN PO LIQD
2.0000 [oz_av] | ORAL | Status: DC
  Administered 2022-09-15 – 2022-09-16 (×6): 2 [oz_av] via ORAL

## 2022-09-14 MED ORDER — BUPIVACAINE HCL (PF) 0.25 % IJ SOLN
INTRAMUSCULAR | Status: AC
  Filled 2022-09-14: qty 30

## 2022-09-14 MED ORDER — CHLORHEXIDINE GLUCONATE 0.12 % MT SOLN
15.0000 mL | Freq: Once | OROMUCOSAL | Status: AC
  Administered 2022-09-14: 15 mL via OROMUCOSAL

## 2022-09-14 MED ORDER — ROCURONIUM BROMIDE 10 MG/ML (PF) SYRINGE
PREFILLED_SYRINGE | INTRAVENOUS | Status: DC | PRN
  Administered 2022-09-14: 70 mg via INTRAVENOUS
  Administered 2022-09-14: 20 mg via INTRAVENOUS

## 2022-09-14 MED ORDER — LIDOCAINE 2% (20 MG/ML) 5 ML SYRINGE
INTRAMUSCULAR | Status: DC | PRN
  Administered 2022-09-14: 40 mg via INTRAVENOUS

## 2022-09-14 MED ORDER — SIMETHICONE 80 MG PO CHEW
80.0000 mg | CHEWABLE_TABLET | Freq: Four times a day (QID) | ORAL | Status: DC | PRN
  Administered 2022-09-15: 80 mg via ORAL
  Filled 2022-09-14: qty 1

## 2022-09-14 MED ORDER — MEPERIDINE HCL 50 MG/ML IJ SOLN: 6.2500 mg | INTRAMUSCULAR | Status: DC | PRN

## 2022-09-14 MED ORDER — DEXMEDETOMIDINE HCL IN NACL 80 MCG/20ML IV SOLN
INTRAVENOUS | Status: AC
  Filled 2022-09-14: qty 20

## 2022-09-14 MED ORDER — ONDANSETRON HCL 4 MG/2ML IJ SOLN
INTRAMUSCULAR | Status: AC
  Filled 2022-09-14: qty 2

## 2022-09-14 MED ORDER — HEPARIN SODIUM (PORCINE) 5000 UNIT/ML IJ SOLN
5000.0000 [IU] | Freq: Three times a day (TID) | INTRAMUSCULAR | Status: DC
  Administered 2022-09-14 – 2022-09-16 (×5): 5000 [IU] via SUBCUTANEOUS
  Filled 2022-09-14 (×5): qty 1

## 2022-09-14 MED ORDER — BUPIVACAINE LIPOSOME 1.3 % IJ SUSP
INTRAMUSCULAR | Status: AC
  Filled 2022-09-14: qty 20

## 2022-09-14 MED ORDER — MIDAZOLAM HCL 2 MG/2ML IJ SOLN
INTRAMUSCULAR | Status: AC
  Filled 2022-09-14: qty 2

## 2022-09-14 MED ORDER — PANTOPRAZOLE SODIUM 40 MG IV SOLR
40.0000 mg | Freq: Every day | INTRAVENOUS | Status: DC
  Administered 2022-09-14 – 2022-09-15 (×2): 40 mg via INTRAVENOUS
  Filled 2022-09-14 (×2): qty 10

## 2022-09-14 MED ORDER — DULOXETINE HCL 60 MG PO CPEP
60.0000 mg | ORAL_CAPSULE | Freq: Every day | ORAL | Status: DC
  Administered 2022-09-14 – 2022-09-16 (×3): 60 mg via ORAL
  Filled 2022-09-14 (×3): qty 1

## 2022-09-14 MED ORDER — BUPIVACAINE LIPOSOME 1.3 % IJ SUSP
INTRAMUSCULAR | Status: DC | PRN
  Administered 2022-09-14: 50 mL

## 2022-09-14 MED ORDER — FENTANYL CITRATE (PF) 100 MCG/2ML IJ SOLN
INTRAMUSCULAR | Status: DC | PRN
  Administered 2022-09-14 (×6): 50 ug via INTRAVENOUS

## 2022-09-14 MED ORDER — SUGAMMADEX SODIUM 200 MG/2ML IV SOLN
INTRAVENOUS | Status: DC | PRN
  Administered 2022-09-14: 280 mg via INTRAVENOUS

## 2022-09-14 SURGICAL SUPPLY — 75 items
ADH SKN CLS APL DERMABOND .7 (GAUZE/BANDAGES/DRESSINGS) ×1
ANTIFOG SOL W/FOAM PAD STRL (MISCELLANEOUS) ×1
APL PRP STRL LF DISP 70% ISPRP (MISCELLANEOUS) ×2
APPLIER CLIP 5 13 M/L LIGAMAX5 (MISCELLANEOUS)
APPLIER CLIP ROT 10 11.4 M/L (STAPLE)
APR CLP MED LRG 11.4X10 (STAPLE)
APR CLP MED LRG 5 ANG JAW (MISCELLANEOUS)
BLADE SURG SZ11 CARB STEEL (BLADE) ×1 IMPLANT
CANNULA REDUCER 12-8 DVNC XI (CANNULA) ×1 IMPLANT
CHLORAPREP W/TINT 26 (MISCELLANEOUS) ×2 IMPLANT
CLIP APPLIE 5 13 M/L LIGAMAX5 (MISCELLANEOUS) IMPLANT
CLIP APPLIE ROT 10 11.4 M/L (STAPLE) IMPLANT
COVER SURGICAL LIGHT HANDLE (MISCELLANEOUS) ×1 IMPLANT
COVER TIP SHEARS 8 DVNC (MISCELLANEOUS) ×1 IMPLANT
DERMABOND ADVANCED .7 DNX12 (GAUZE/BANDAGES/DRESSINGS) ×1 IMPLANT
DRAPE ARM DVNC X/XI (DISPOSABLE) ×4 IMPLANT
DRAPE COLUMN DVNC XI (DISPOSABLE) ×1 IMPLANT
DRIVER NDL LRG 8 DVNC XI (INSTRUMENTS) ×1 IMPLANT
DRIVER NDL MEGA SUTCUT DVNCXI (INSTRUMENTS) ×1 IMPLANT
DRIVER NDLE LRG 8 DVNC XI (INSTRUMENTS) ×1 IMPLANT
DRIVER NDLE MEGA SUTCUT DVNCXI (INSTRUMENTS) ×1 IMPLANT
ELECT REM PT RETURN 15FT ADLT (MISCELLANEOUS) ×1 IMPLANT
FORCEPS CADIERE DVNC XI (FORCEP) ×1 IMPLANT
GAUZE 4X4 16PLY ~~LOC~~+RFID DBL (SPONGE) ×1 IMPLANT
GLOVE BIO SURGEON STRL SZ7.5 (GLOVE) ×2 IMPLANT
GLOVE INDICATOR 8.0 STRL GRN (GLOVE) ×2 IMPLANT
GOWN STRL REUS W/ TWL XL LVL3 (GOWN DISPOSABLE) ×3 IMPLANT
GOWN STRL REUS W/TWL XL LVL3 (GOWN DISPOSABLE) ×3
GRASPER SUT TROCAR 14GX15 (MISCELLANEOUS) ×1 IMPLANT
GRASPER TIP-UP FEN DVNC XI (INSTRUMENTS) ×1 IMPLANT
IRRIG SUCT STRYKERFLOW 2 WTIP (MISCELLANEOUS)
IRRIGATION SUCT STRKRFLW 2 WTP (MISCELLANEOUS) IMPLANT
IRRIGATOR SUCT 8 DISP DVNC XI (IRRIGATION / IRRIGATOR) IMPLANT
KIT BASIN OR (CUSTOM PROCEDURE TRAY) ×1 IMPLANT
KIT GASTRIC LAVAGE 34FR ADT (SET/KITS/TRAYS/PACK) ×1 IMPLANT
KIT TURNOVER KIT A (KITS) IMPLANT
LUBRICANT JELLY K Y 4OZ (MISCELLANEOUS) IMPLANT
MARKER SKIN DUAL TIP RULER LAB (MISCELLANEOUS) ×1 IMPLANT
MAT PREVALON FULL STRYKER (MISCELLANEOUS) ×1 IMPLANT
NDL SPNL 18GX3.5 QUINCKE PK (NEEDLE) ×1 IMPLANT
NEEDLE SPNL 18GX3.5 QUINCKE PK (NEEDLE) ×1 IMPLANT
OBTURATOR OPTICAL STND 8 DVNC (TROCAR) ×1
OBTURATOR OPTICALSTD 8 DVNC (TROCAR) ×1 IMPLANT
PACK CARDIOVASCULAR III (CUSTOM PROCEDURE TRAY) ×1 IMPLANT
RELOAD STAPLE 60 2.5 WHT DVNC (STAPLE) ×3 IMPLANT
RELOAD STAPLE 60 3.5 BLU DVNC (STAPLE) IMPLANT
RELOAD STAPLER 2.5X60 WHT DVNC (STAPLE) ×8 IMPLANT
RELOAD STAPLER 3.5X60 BLU DVNC (STAPLE) IMPLANT
SCISSORS MNPLR CVD DVNC XI (INSTRUMENTS) ×1 IMPLANT
SEAL UNIV 5-12 XI (MISCELLANEOUS) ×4 IMPLANT
SEALER VESSEL EXT DVNC XI (MISCELLANEOUS) ×1 IMPLANT
SET TUBE SMOKE EVAC HIGH FLOW (TUBING) ×1 IMPLANT
SOL ELECTROSURG ANTI STICK (MISCELLANEOUS) ×1
SOLUTION ANTFG W/FOAM PAD STRL (MISCELLANEOUS) ×1 IMPLANT
SOLUTION ELECTROSURG ANTI STCK (MISCELLANEOUS) ×1 IMPLANT
SPIKE FLUID TRANSFER (MISCELLANEOUS) ×1 IMPLANT
STAPLER 60 SUREFORM DVNC (STAPLE) ×1 IMPLANT
STAPLER RELOAD 2.5X60 WHT DVNC (STAPLE) ×8
STAPLER RELOAD 3.5X60 BLU DVNC (STAPLE)
SUT MNCRL AB 4-0 PS2 18 (SUTURE) ×1 IMPLANT
SUT SILK 0 SH 30 (SUTURE) IMPLANT
SUT SILK 2 0 SH (SUTURE) ×2 IMPLANT
SUT V-LOC BARB 180 2/0GR6 GS22 (SUTURE) ×2
SUT VIC AB 2-0 SH 27 (SUTURE)
SUT VIC AB 2-0 SH 27XBRD (SUTURE) IMPLANT
SUT VICRYL 0 TIES 12 18 (SUTURE) ×1 IMPLANT
SUT VLOC BARB 180 ABS3/0GR12 (SUTURE) ×2
SUTURE V-LC BRB 180 2/0GR6GS22 (SUTURE) ×2 IMPLANT
SUTURE VLOC BRB 180 ABS3/0GR12 (SUTURE) ×2 IMPLANT
SYR 20ML LL LF (SYRINGE) ×1 IMPLANT
TOWEL OR 17X26 10 PK STRL BLUE (TOWEL DISPOSABLE) ×1 IMPLANT
TRAY FOLEY MTR SLVR 16FR STAT (SET/KITS/TRAYS/PACK) IMPLANT
TROCAR ADV FIXATION 12X100MM (TROCAR) IMPLANT
TROCAR Z-THREAD FIOS 5X100MM (TROCAR) ×1 IMPLANT
TUBE CALIBRATION LAPBAND (TUBING) IMPLANT

## 2022-09-14 NOTE — H&P (Signed)
Admitting Physician: Hyman Hopes Darrius Montano  Service: Bariatric surgery  CC: Obesity  Subjective   HPI: Christine [name] is an 72 y.o. female who is here for gastric bypass.  Past Medical History:  Diagnosis Date   Allergy    dust, pollen, sulfa, prednisone   Anemia    Anxiety    Arthritis    "knees" (04/26/2016)   Colon polyps    benign per pt   Coronary artery disease    mild per 2015 cath in [place] (OM1 30%, RCA 30%)   GERD (gastroesophageal reflux disease)    Gout    History of hiatal hernia    Hyperlipidemia 05/20/2021   Hypertension    Migraine    "none since early /2017" (05/06/2016)   Obesity    OSA on CPAP    uses CPAP   Pre-diabetes    Pre-operative cardiovascular examination 08/22/2008   Vasculitis (HCC)    Bilateral    Past Surgical History:  Procedure Laterality Date   ABDOMINAL HYSTERECTOMY     "partial"; both ovaries present   APPENDECTOMY  05/06/2016   BIOPSY  04/08/2022   Procedure: BIOPSY;  Surgeon: Sherrilyn Rist, MD;  Location: [facility];  Service: Gastroenterology;;   CARDIAC CATHETERIZATION  ~ 2015   CARDIAC CATHETERIZATION  2015   In [place]   CHILECTOMY Right 06/01/2017   Procedure: CHILECTOMY RIGHT FOOT;  Surgeon: Felecia Shelling, DPM;  [facility];  Service: Podiatry;  Laterality: Right;   ESOPHAGOGASTRODUODENOSCOPY (EGD) WITH PROPOFOL N/A 04/08/2022   Procedure: ESOPHAGOGASTRODUODENOSCOPY (EGD) WITH PROPOFOL;  Surgeon: Sherrilyn Rist, MD;  Location: [facility];  Service: Gastroenterology;  Laterality: N/A;   LAPAROSCOPIC APPENDECTOMY N/A 05/06/2016   Procedure: LAPAROSCOPIC APPENDECTOMY;  Surgeon: Manus Rudd, MD;  Location: [facility];  Service: General;  Laterality: N/A;   POLYPECTOMY  04/08/2022   Procedure: POLYPECTOMY;  Surgeon: Sherrilyn Rist, MD;  [facility]: [facility];  Service: Gastroenterology;;   TONSILLECTOMY      Family History  Problem Relation Age of Onset   Diabetes Mother    Heart disease  Mother    High blood pressure Mother    Asthma Mother    Arthritis Mother    Diabetes Father    Heart disease Father    High blood pressure Father    Asthma Father    Diabetes Sister    Alcohol abuse Brother    Drug abuse Brother    Arthritis Sister    Diabetes Sister    Varicose Veins Sister    Colon cancer Neg Hx    Esophageal cancer Neg Hx    Rectal cancer Neg Hx     Social:  reports that she quit smoking about 44 years ago. Her smoking use included cigarettes. She has a 0.60 pack-year smoking history. She has never used smokeless tobacco. She reports that she does not drink alcohol and does not use drugs.  Allergies:  Allergies  Allergen Reactions   Prednisone Swelling    Legs swell    Tape Hives   Sulfa Antibiotics Rash    Medications: Current Outpatient Medications  Medication Instructions   aspirin EC 81 mg, Oral, Daily   augmented betamethasone dipropionate (DIPROLENE-AF) 0.05 % cream 1 Application, Topical, 2 times daily   DULoxetine (CYMBALTA) 60 mg, Oral, Daily   esomeprazole (NEXIUM) 40 mg, Oral, Daily   ferrous sulfate 325 mg, Oral, Daily with breakfast   ibuprofen (ADVIL) 800 mg, Oral, Every 8 hours PRN  metoprolol tartrate (LOPRESSOR) 50 mg, Oral, 2 times daily   oxybutynin (DITROPAN XL) 15 MG 24 hr tablet TAKE ONE TABLET BY MOUTH EVERY NIGHT AT BEDTIME   potassium chloride (KLOR-CON) 10 MEQ tablet 10 mEq, Oral, Daily   rosuvastatin (CRESTOR) 10 mg, Oral, Daily   triamterene-hydrochlorothiazide (MAXZIDE) 75-50 MG tablet 1 tablet, Oral, Daily    ROS - all of the below systems have been reviewed with the patient and positives are indicated with bold text General: chills, fever or night sweats Eyes: blurry vision or double vision ENT: epistaxis or sore throat Allergy/Immunology: itchy/watery eyes or nasal congestion Hematologic/Lymphatic: bleeding problems, blood clots or swollen lymph nodes Endocrine: temperature intolerance or unexpected weight  changes Breast: new or changing breast lumps or nipple discharge Resp: cough, shortness of breath, or wheezing CV: chest pain or dyspnea on exertion GI: as per HPI GU: dysuria, trouble voiding, or hematuria MSK: joint pain or joint stiffness Neuro: TIA or stroke symptoms Derm: pruritus and skin lesion changes Psych: anxiety and depression  Objective   PE Blood pressure (!) 151/77, pulse 78, temperature 97.7 F (36.5 C), temperature source Oral, resp. rate 18, height 5' 1.5" (1.562 m), weight 131.4 kg, SpO2 96 %. Constitutional: NAD; conversant; no deformities Eyes: Moist conjunctiva; no lid lag; anicteric; PERRL Neck: Trachea midline; no thyromegaly Lungs: Normal respiratory effort; no tactile fremitus CV: RRR; no palpable thrills; no pitting edema GI: Abd Soft, nontender; no palpable hepatosplenomegaly MSK: Normal range of motion of extremities; no clubbing/cyanosis Psychiatric: Appropriate affect; alert and oriented x3 Lymphatic: No palpable cervical or axillary lymphadenopathy  Results for orders placed or performed during the hospital encounter of 09/14/22 (from the past 24 hour(s))  ABO/Rh     Status: None (Preliminary result)   Collection Time: 09/14/22 10:30 AM  Result Value Ref Range   ABO/RH(D) PENDING   Glucose, capillary     Status: None   Collection Time: 09/14/22 10:43 AM  Result Value Ref Range   Glucose-Capillary 90 70 - 99 mg/dL   Comment 1 Notify RN    Comment 2 Document in Chart     Imaging Orders  No imaging studies ordered today     Assessment and Plan   Christine [name] is a 72 y.o. female who has completed the bariatric preoperative pathway and presents for surgery.   Diagnoses and all orders for this visit: Morbid obesity with body mass index (BMI) of 50.0 to 59.9 in adult (CMS/HHS-HCC) Hyperlipidemia, unspecified hyperlipidemia type Gastroesophageal reflux disease, unspecified whether esophagitis present Hypertension, unspecified  type Obstructive sleep apnea   We again discussed the procedure of robotic gastric bypass with upper endoscopy. We discussed the procedure itself as well as its risk, benefits, and alternatives. After full discussion all questions answered the patient granted consent to proceed. We will proceed as scheduled.     Quentin Ore, MD  [facility] Use [email or web address] to contact on call provider

## 2022-09-14 NOTE — Op Note (Signed)
Patient: Christine [name] (1950-08-17, [id number])  Date of Surgery: 09/14/2022   Preoperative Diagnosis: MORBID OBESITY   Postoperative Diagnosis: MORBID OBESITY   Surgical Procedure: ROBOTIC GASTRIC BYPASS RNY, UPPER GASTROINTESTINAL ENDOSCOPY: [id number] (CPT)   Operative Team Members:  Surgeon(s) and Role:    * Norberta Stobaugh, Hyman Hopes, MD - Primary    * Luretha Murphy, MD - Assisting   Anesthesiologist: Jairo Ben, MD CRNA: Shanon Payor, CRNA; Nelle Don, CRNA; Wynonia Sours, CRNA   Anesthesia: General   Fluids:  Total I/O In: 1100 [I.V.:1000; IV Piggyback:100] Out: 50 [Blood:50]  Complications: * No complications entered in OR log *  Drains: None   Specimen: * No specimens in log *   Disposition:  PACU - hemodynamically stable.  Plan of Care: Admit to inpatient     Indications for Procedure:   Christine [name] is a 72 y.o. female who has completed the bariatric preoperative pathway and presents for surgery.    Diagnoses and all orders for this visit: Morbid obesity with body mass index (BMI) of 50.0 to 59.9 in adult (CMS/HHS-HCC) Hyperlipidemia, unspecified hyperlipidemia type Gastroesophageal reflux disease, unspecified whether esophagitis present Hypertension, unspecified type Obstructive sleep apnea   The procedure itself as well as its risks, benefits and alternatives were discussed.  The risks discussed included but were not limited to the risk of infection, bleeding, damage to nearby structures.  After a full discussion and all questions answered the patient granted consent to proceed.  Findings: Normal anatomy   Description of Procedure:   On the date stated above, the patient was taken to the operating room suite and placed in supine positioning.  General endotracheal anesthesia was induced.  A timeout was completed verifying the correct patient, procedure, positioning and equipment needed for the case.  The patient's abdomen was  prepped and draped in the usual sterile fashion.  A 5 mm trocar was used to enter the right upper quadrant using optical technique.  The abdomen was entered safely without any trauma the underlying viscera.  Three additional incisions were made and 4 robotic trochars were placed across the abdomen, replacing the 5 mm trocar with the 12 mm robotic stapler trocar.  [facility] liver retractor was placed through the subxiphoid region and under the left lobe of the liver and was connected to the rail of the bed.  A TAP block was placed using marcaine and Exparel under direct vision of the laparoscope.  The [employer] XI robotic platform was docked and we transitioned to robotic surgery.  Using the tip up grasper, fenestrated bipolar, 30 degree camera and Vessel Sealer from the patient's right to left, we began by dissecting the angle of His off the left crus of the diaphragm.  The adhesions between the stomach, spleen and diaphragm were divided using the Vessel Sealer to define the angle of His.  I then started 4-6 cm down on the lesser curve of the stomach and created a defect in the gastrohepatic ligament tracking behind the lesser curve of the stomach to enter the lesser sac.  I then used multiple white loads of the robotic 60 mm Sureform linear stapler to form the gastric pouch.  I then directed my attention to the lower abdomen.  The omentum was divided with the Vessel Sealer and I identified the ligament of treitz.  The jejunum was run to a point 50 cm from the ligament of Treitz.  This loop of bowel was then brought into the left  upper quadrant, over the transverse colon, between the split omentum.  A 2-0 v-loc suture was used to create the posterior outer row of the gastrojejunal anastomosis.  An approximately 2 cm gastrotomy was made in the pouch and a matching 2 cm enterotomy was created in the roux limb.  Then, two 3-0 v-loc sutures were used to create a posterior, inner, full thickness layer of the  anastomosis.  While sewing the anterior inner layer, the Ewald tube was passed through the anastomosis to ensure patency.  The outer, anterior layer was then created using 2-0 v-loc suture.  A window was made in the jejunal mesentery and the jejunum was divided just proximal to the gastrojejunal anastomosis using a white load of the robotic 60 mm Sureform linear stapler to divide the roux limb from the hepatobiliary limb.  At this point the gastrojejunal anastomosis was complete and the Ewald tube was removed.  The roux limb was then run 100 cm from the gastrojejunal anastomosis.  This loop of bowel was brought into the left upper quadrant for anastomosis to the hepatobiliary limb.  A robotic 60 mm white load on the Sureform linear stapler was introduced into both limbs and fired to create the jejunojejunostomy.  A second 60 mm white load of the Sureform linear stapler was fired to connect the distal Roux limb to the proximal common channel creating a W shaped anastomosis.  The common enterotomy was closed with a final 60 mm white load of the Sureform linear stapler.  The jejunojejunostomy mesenteric defect was closed using running 0-Ethibond suture.   The retro-roux defect was closed, closing the roux limb mesentery to the transverse mesocolon mesentery using a running 0-Ethibond suture.   I ran the roux limb from the gastrojejunal anastomosis to the jejunojejunostomy and found the anatomy as expected without any twisting of the mesentery.  The intra-abdominal pressure was decreased to for the remainder of the case to check for hemostasis.  I then transitioned to endoscopic portion of the procedure.  The adult upper endoscope was inserted into the pouch.  The pouch appeared appropriately sized.  There was good intra-luminal hemostasis.  The endoscope was inserted through the anastomosis into the roux limb.  The anastomosis appeared well formed without any stricture.  The anastomosis was submerged in  irrigation in the left upper quadrant and there was no leakage of air bubbles with endoscopic insufflation suggesting a negative leak test and an air tight anastomosis.    The foregut was decompressed with the endoscope and the endoscope was removed.  The robotic instruments were removed and the robot was undocked.  The stapler port in the right abdomen was closed at the fascial level using 0-vicryl on a PMI suture passer.  The liver retractor was removed under direct vision.  The pneumoperitoneum was evacuated.  The skin was closed using 4-0 Monocryl and Dermabond.  All sponge and needle counts were correct at the end of the case.    Ivar Drape, MD General, Bariatric, & Minimally Invasive Surgery [facility], [place]

## 2022-09-14 NOTE — Progress Notes (Signed)
PHARMACY CONSULT FOR:  Risk Assessment for Post-Discharge VTE Following Bariatric Surgery  Post-Discharge VTE Risk Assessment: This patient's probability of 30-day post-discharge VTE is increased due to the factors marked:  Sleeve gastrectomy   Liver disorder (transplant, cirrhosis, or nonalcoholic steatohepatitis)   Hx of VTE   Hemorrhage requiring transfusion   GI perforation, leak, or obstruction   ====================================================    Female  X  Age >/=60 years  X  BMI >/=50 kg/m2    CHF    Dyspnea at Rest    Paraplegia  X  Non-gastric-band surgery    Operation Time >/=3 hr    Return to OR     Length of Stay >/= 3 d   Hypercoagulable condition   Significant venous stasis      Predicted probability of 30-day post-discharge VTE: 0.52 %  Other patient-specific factors to consider: none  Recommendation for Discharge: Enoxaparin 60 mg Christine [name] q12h x 4 weeks post-discharge  Christine [name] is a 72 y.o. female who underwent ROBOTIC GASTRIC BYPASS RNY 09/14/2022   Allergies  Allergen Reactions   Prednisone Swelling    Legs swell    Tape Hives   Sulfa Antibiotics Rash    Patient Measurements: Height: 5' 1.5" (156.2 cm) Weight: 131.4 kg (289 lb 9.6 oz) IBW/kg (Calculated) : 48.95 Body mass index is 53.83 kg/m.  No results for input(s): "WBC", "HGB", "HCT", "PLT", "APTT", "CREATININE", "LABCREA", "CREAT24HRUR", "MG", "PHOS", "ALBUMIN", "PROT", "AST", "ALT", "ALKPHOS", "BILITOT", "BILIDIR", "IBILI" in the last 72 hours. Estimated Creatinine Clearance: 75.5 mL/min (by C-G formula based on SCr of 0.91 mg/dL).    Past Medical History:  Diagnosis Date   Allergy    dust, pollen, sulfa, prednisone   Anemia    Anxiety    Arthritis    "knees" (04/26/2016)   Colon polyps    benign per pt   Coronary artery disease    mild per 2015 cath in [place] (OM1 30%, RCA 30%)   GERD (gastroesophageal reflux disease)    Gout    History of hiatal hernia     Hyperlipidemia 05/20/2021   Hypertension    Migraine    "none since early /2017" (05/06/2016)   Obesity    OSA on CPAP    uses CPAP   Pre-diabetes    Pre-operative cardiovascular examination 08/22/2008   Vasculitis (HCC)    Bilateral     Medications Prior to Admission  Medication Sig Dispense Refill Last Dose   aspirin 81 MG EC tablet Take 81 mg by mouth daily.   09/07/2022   augmented betamethasone dipropionate (DIPROLENE-AF) 0.05 % cream Apply 1 Application topically 2 (two) times daily.   Past Month   DULoxetine (CYMBALTA) 60 MG capsule Take 1 capsule (60 mg total) by mouth daily. 90 capsule 1 09/13/2022 at 0800   esomeprazole (NEXIUM) 40 MG capsule Take 1 capsule (40 mg total) by mouth daily. 90 capsule 1 09/13/2022 at 0800   ferrous sulfate 325 (65 FE) MG EC tablet Take 325 mg by mouth daily with breakfast.   09/13/2022 at 0800   ibuprofen (ADVIL) 800 MG tablet TAKE 1 TABLET BY MOUTH EVERY 8 HOURS AS NEEDED 90 tablet 0 Past Month   metoprolol tartrate (LOPRESSOR) 50 MG tablet Take 1 tablet (50 mg total) by mouth 2 (two) times daily. 180 tablet 3 09/13/2022 at 1800   oxybutynin (DITROPAN XL) 15 MG 24 hr tablet TAKE ONE TABLET BY MOUTH EVERY NIGHT AT BEDTIME 60 tablet 0 09/13/2022 at 2000   rosuvastatin (  CRESTOR) 10 MG tablet Take 1 tablet (10 mg total) by mouth daily. 90 tablet 3 09/13/2022 at 0800   triamterene-hydrochlorothiazide (MAXZIDE) 75-50 MG tablet Take 1 tablet by mouth daily. 30 tablet 8 09/13/2022 at 0800   potassium chloride (KLOR-CON) 10 MEQ tablet Take 1 tablet (10 mEq total) by mouth daily. (Patient not taking: Reported on 08/30/2022) 3 tablet 0 Not Taking     Herby Abraham, Pharm.D Use secure chat for questions 09/14/2022 2:59 PM

## 2022-09-14 NOTE — Anesthesia Procedure Notes (Signed)
Procedure Name: Intubation Date/Time: 09/14/2022 12:26 PM  Performed by: Wynonia Sours, CRNAPre-anesthesia Checklist: Patient identified, Emergency Drugs available, Suction available, Patient being monitored and Timeout performed Patient Re-evaluated:Patient Re-evaluated prior to induction Oxygen Delivery Method: Circle system utilized Preoxygenation: Pre-oxygenation with 100% oxygen Induction Type: IV induction Ventilation: Mask ventilation without difficulty Laryngoscope Size: Mac and 4 Grade View: Grade I Tube type: Oral Tube size: 7.0 mm Number of attempts: 2 Airway Equipment and Method: Stylet Placement Confirmation: ETT inserted through vocal cords under direct vision, positive ETCO2, CO2 detector and breath sounds checked- equal and bilateral Secured at: 21 cm Tube secured with: Tape Dental Injury: Teeth and Oropharynx as per pre-operative assessment

## 2022-09-14 NOTE — Transfer of Care (Signed)
Immediate Anesthesia Transfer of Care Note  Patient: Christine [name]  Procedure(s) Performed: ROBOTIC GASTRIC BYPASS RNY, UPPER GASTROINTESTINAL ENDOSCOPY  Patient Location: PACU  Anesthesia Type:General  Level of Consciousness: awake, alert , and oriented  Airway & Oxygen Therapy: Patient Spontanous Breathing and Patient connected to face mask oxygen  Post-op Assessment: Report given to RN, Post -op Vital signs reviewed and stable, and Patient moving all extremities X 4  Post vital signs: Reviewed and stable  Last Vitals:  Vitals Value Taken Time  BP 154/78   Temp    Pulse 110 09/14/22 1500  Resp 20 09/14/22 1500  SpO2 99 % 09/14/22 1500  Vitals shown include unvalidated device data.  Last Pain:  Vitals:   09/14/22 1100  TempSrc:   PainSc: 0-No pain         Complications: No notable events documented.

## 2022-09-14 NOTE — Progress Notes (Signed)
Pt was very sleepy, dosing off during encounter. Reminded and provided pt with QI "Goals for Discharge" document with patient including ambulation in halls, Incentive Spirometry use every hour, and oral care.  Also discussed pain and nausea control.  Enabled or verified head of bed 30 degree alarm activated.  Questions answered.  Will continue to partner with bedside RN and follow up with patient per protocol.

## 2022-09-14 NOTE — Anesthesia Postprocedure Evaluation (Signed)
Anesthesia Post Note  Patient: Christine [name]  Procedure(s) Performed: ROBOTIC GASTRIC BYPASS RNY, UPPER GASTROINTESTINAL ENDOSCOPY     Patient location during evaluation: PACU Anesthesia Type: General Level of consciousness: patient cooperative, oriented and sedated Pain management: pain level controlled Vital Signs Assessment: post-procedure vital signs reviewed and stable Respiratory status: spontaneous breathing, nonlabored ventilation, respiratory function stable and patient connected to nasal cannula oxygen Cardiovascular status: blood pressure returned to baseline and stable Postop Assessment: no apparent nausea or vomiting Anesthetic complications: no   No notable events documented.  Last Vitals:  Vitals:   09/14/22 1530 09/14/22 1545  BP: 131/70 137/78  Pulse: 68 71  Resp: 20 (!) 21  Temp:    SpO2: 99% 99%    Last Pain:  Vitals:   09/14/22 1545  TempSrc:   PainSc: Asleep                 Nicklous Aburto,E. Clelia Trabucco

## 2022-09-15 LAB — CBC WITH DIFFERENTIAL/PLATELET
Abs Immature Granulocytes: 0.03 10*3/uL (ref 0.00–0.07)
Basophils Absolute: 0 10*3/uL (ref 0.0–0.1)
Basophils Relative: 0 %
Eosinophils Absolute: 0 10*3/uL (ref 0.0–0.5)
Eosinophils Relative: 0 %
HCT: 32.3 % — ABNORMAL LOW (ref 36.0–46.0)
Hemoglobin: 9.7 g/dL — ABNORMAL LOW (ref 12.0–15.0)
Immature Granulocytes: 1 %
Lymphocytes Relative: 26 %
Lymphs Abs: 1.2 10*3/uL (ref 0.7–4.0)
MCH: 26.9 pg (ref 26.0–34.0)
MCHC: 30 g/dL (ref 30.0–36.0)
MCV: 89.5 fL (ref 80.0–100.0)
Monocytes Absolute: 0.3 10*3/uL (ref 0.1–1.0)
Monocytes Relative: 7 %
Neutro Abs: 3 10*3/uL (ref 1.7–7.7)
Neutrophils Relative %: 66 %
Platelets: 282 10*3/uL (ref 150–400)
RBC: 3.61 MIL/uL — ABNORMAL LOW (ref 3.87–5.11)
RDW: 15.9 % — ABNORMAL HIGH (ref 11.5–15.5)
WBC: 4.5 10*3/uL (ref 4.0–10.5)
nRBC: 0 % (ref 0.0–0.2)

## 2022-09-15 MED ORDER — PANTOPRAZOLE SODIUM 40 MG PO TBEC: 40.0000 mg | DELAYED_RELEASE_TABLET | Freq: Every day | ORAL | 0 refills | Status: DC

## 2022-09-15 MED ORDER — ONDANSETRON 4 MG PO TBDP: 4.0000 mg | ORAL_TABLET | Freq: Four times a day (QID) | ORAL | 0 refills | Status: DC | PRN

## 2022-09-15 MED ORDER — METHOCARBAMOL 750 MG PO TABS: 750.0000 mg | ORAL_TABLET | Freq: Four times a day (QID) | ORAL | 0 refills | Status: DC | PRN

## 2022-09-15 MED ORDER — ACETAMINOPHEN 500 MG PO TABS
1000.0000 mg | ORAL_TABLET | Freq: Three times a day (TID) | ORAL | 0 refills | Status: AC
Start: 2022-09-15 — End: 2022-09-20

## 2022-09-15 MED ORDER — GABAPENTIN 100 MG PO CAPS
200.0000 mg | ORAL_CAPSULE | Freq: Two times a day (BID) | ORAL | 0 refills | Status: DC
Start: 2022-09-15 — End: 2022-09-28

## 2022-09-15 MED ORDER — ENOXAPARIN SODIUM 60 MG/0.6ML IJ SOSY: 60.0000 mg | PREFILLED_SYRINGE | Freq: Two times a day (BID) | INTRAMUSCULAR | 0 refills | Status: DC

## 2022-09-15 MED ORDER — OXYCODONE HCL 5 MG PO TABS
5.0000 mg | ORAL_TABLET | Freq: Four times a day (QID) | ORAL | 0 refills | Status: DC | PRN
Start: 2022-09-15 — End: 2022-09-28

## 2022-09-15 NOTE — TOC CM/SW Note (Signed)
  Transition of Care [facility]) Screening Note   Patient Details  Name: Christine [name] Date of Birth: [birth year]   Transition of Care [facility]) CM/SW Contact:    Amada Jupiter, LCSW Phone Number: 09/15/2022, 10:27 AM    Transition of Care Department [facility]) has reviewed patient and no TOC needs have been identified at this time. We will continue to monitor patient advancement through interdisciplinary progression rounds. If new patient transition needs arise, please place a TOC consult.

## 2022-09-15 NOTE — Progress Notes (Signed)
Mobility Specialist - Progress Note   09/15/22 0917  Mobility  Activity Ambulated with assistance in hallway  Level of Assistance Modified independent, requires aide device or extra time  Assistive Device Cane  Distance Ambulated (ft) 250 ft  Activity Response Tolerated well  Mobility Referral Yes  $Mobility charge 1 Mobility  Mobility Specialist Start Time (ACUTE ONLY) [id number]  Mobility Specialist Stop Time (ACUTE ONLY) 0916  Mobility Specialist Time Calculation (min) (ACUTE ONLY) 11 min   Pt received in recliner and agreeable to mobility. Ambulation cut short due to IV Pole dying. No complaints during session. Pt to recliner after session with all needs met.   [facility]

## 2022-09-15 NOTE — Progress Notes (Signed)
Patient alert and oriented, working towards of controlled. Patient is tolerating fluids, advanced to protein shake today, patient is tolerating "better than the water." Reviewed Gastric sleeve/bypass discharge instructions with patient and patient is able to articulate understanding. Provided information on BELT program, Support Group, BSTOP-D, and [facility] outpatient pharmacy. Communicated general update of patient status to surgeon. All questions answered. 24hr fluid recall is 320 mL Per hydration protocol, bariatric nurse coordinator to make follow-up phone call within one week.    Thank you,  Lubertha Basque, RN, MSN Bariatric Nurse Coordinator [phone] (office)

## 2022-09-15 NOTE — Progress Notes (Signed)
Progress Note: General Surgery Service   Chief Complaint/Subjective: Doing well POD1.  Some pain but improving  Objective: Vital signs in last 24 hours: Temp:  [97.3 F (36.3 C)-98.6 F (37 C)] 97.8 F (36.6 C) (05/22 1003) Pulse Rate:  [56-77] 68 (05/22 1400) Resp:  [18-25] 18 (05/22 1400) BP: (131-157)/(70-87) 147/82 (05/22 1400) SpO2:  [97 %-100 %] 97 % (05/22 1400)    Intake/Output from previous day: 05/21 0701 - 05/22 0700 In: 2274.8 [P.O.:260; I.V.:1914.8; IV Piggyback:100] Out: 450 [Urine:400; Blood:50] Intake/Output this shift: Total I/O In: 612.1 [P.O.:240; I.V.:372.1] Out: 300 [Urine:300]  Constitutional: NAD; conversant; no deformities Eyes: Moist conjunctiva; no lid lag; anicteric; PERRL Neck: Trachea midline; no thyromegaly Lungs: Normal respiratory effort; no tactile fremitus CV: RRR; no palpable thrills; no pitting edema GI: Abd Soft, nontender; no palpable hepatosplenomegaly MSK: Normal range of motion of extremities; no clubbing/cyanosis Psychiatric: Appropriate affect; alert and oriented x3 Lymphatic: No palpable cervical or axillary lymphadenopathy  Lab Results: CBC  Recent Labs    09/14/22 1512 09/14/22 1926 09/15/22 0505  WBC 4.3  --  4.5  HGB 10.0* 9.6* 9.7*  HCT 32.9* 31.4* 32.3*  PLT 250  --  282   BMET Recent Labs    09/14/22 1512  CREATININE 1.04*   PT/INR No results for input(s): "LABPROT", "INR" in the last 72 hours. ABG No results for input(s): "PHART", "HCO3" in the last 72 hours.  Invalid input(s): "PCO2", "PO2"  Anti-infectives: Anti-infectives (From admission, onward)    Start     Dose/Rate Route Frequency Ordered Stop   09/14/22 1030  cefoTEtan (CEFOTAN) 2 g in sodium chloride 0.9 % 100 mL IVPB        2 g 200 mL/hr over 30 Minutes Intravenous On call to O.R. 09/14/22 1025 09/14/22 1256       Medications: Scheduled Meds:  acetaminophen  1,000 mg Oral Q8H   Or   acetaminophen (TYLENOL) oral liquid 160 mg/5 mL   1,000 mg Oral Q8H   DULoxetine  60 mg Oral Daily   heparin injection (subcutaneous)  5,000 Units Subcutaneous Q8H   metoprolol tartrate  50 mg Oral BID   oxybutynin  15 mg Oral QHS   pantoprazole (PROTONIX) IV  40 mg Intravenous QHS   Ensure Max Protein  2 oz Oral Q2H   rosuvastatin  10 mg Oral Daily   Continuous Infusions:  lactated ringers 75 mL/hr at 09/14/22 1733   PRN Meds:.morphine injection, ondansetron (ZOFRAN) IV, oxyCODONE, simethicone  Assessment/Plan: s/p Procedure(s): ROBOTIC GASTRIC BYPASS RNY, UPPER GASTROINTESTINAL ENDOSCOPY 09/14/2022  Doing well POD1 Continue protocol Plan to stay overnight for discharge tomorrow   LOS: 1 day     Quentin Ore, MD  [facility] Use [email or web address] to contact on call provider  Daily Billing: [id number] - post op

## 2022-09-16 ENCOUNTER — Other Ambulatory Visit (HOSPITAL_COMMUNITY): Payer: Self-pay

## 2022-09-16 LAB — CBC WITH DIFFERENTIAL/PLATELET
Abs Immature Granulocytes: 0.06 10*3/uL (ref 0.00–0.07)
Basophils Absolute: 0 10*3/uL (ref 0.0–0.1)
Basophils Relative: 0 %
Eosinophils Absolute: 0 10*3/uL (ref 0.0–0.5)
Eosinophils Relative: 0 %
HCT: 30.4 % — ABNORMAL LOW (ref 36.0–46.0)
Hemoglobin: 9.2 g/dL — ABNORMAL LOW (ref 12.0–15.0)
Immature Granulocytes: 1 %
Lymphocytes Relative: 36 %
Lymphs Abs: 2.6 10*3/uL (ref 0.7–4.0)
MCH: 26.9 pg (ref 26.0–34.0)
MCHC: 30.3 g/dL (ref 30.0–36.0)
MCV: 88.9 fL (ref 80.0–100.0)
Monocytes Absolute: 0.5 10*3/uL (ref 0.1–1.0)
Monocytes Relative: 8 %
Neutro Abs: 4 10*3/uL (ref 1.7–7.7)
Neutrophils Relative %: 55 %
Platelets: 244 10*3/uL (ref 150–400)
RBC: 3.42 MIL/uL — ABNORMAL LOW (ref 3.87–5.11)
RDW: 16.1 % — ABNORMAL HIGH (ref 11.5–15.5)
WBC: 7.2 10*3/uL (ref 4.0–10.5)
nRBC: 0 % (ref 0.0–0.2)

## 2022-09-16 NOTE — Plan of Care (Signed)
Patient is stable for discharge, discharge instructions have been given. All questions answered, patient is discharged to home with family/friend.

## 2022-09-16 NOTE — TOC Initial Note (Signed)
Transition of Care [facility]) - Initial/Assessment Note    Patient Details  Name: Christine [name] MRN: [id number] Date of Birth: 11/19/1950  Transition of Care [facility]) CM/SW Contact:    Adrian Prows, RN Phone Number: 09/16/2022, 9:27 AM  Clinical Narrative:                 [facility] consult for medication assist; spoke w/ pt in room; she says she is from home and plans to return at d/c; she identified POC sister Corrin Hayhurst [phone]); pt says she has transportation; she denies IPV, food insecurity, and difficulty paying utilities; pt says she has glasses, cane, and CPAP; pt says she does not have HH services or home oxygen; pt says she does not need assistance for co pay for enoxaparin; pt says she "has a problem w/ needles"; Dr Dossie Der notified by secure chat; no TOC needs  Expected Discharge Plan: Home/Self Care Barriers to Discharge: No Barriers Identified   Patient Goals and CMS Choice Patient states their goals for this hospitalization and ongoing recovery are:: home   Choice offered to / list presented to : NA      Expected Discharge Plan and Services   Discharge Planning Services: CM Consult   Living arrangements for the past 2 months: Single Family Home Expected Discharge Date: 09/16/22               DME Arranged: N/A DME Agency: NA       HH Arranged: NA HH Agency: NA        Prior Living Arrangements/Services Living arrangements for the past 2 months: Single Family Home Lives with:: Self Patient language and need for interpreter reviewed:: Yes Do you feel safe going back to the place where you live?: Yes      Need for Family Participation in Patient Care: Yes (Comment) Care giver support system in place?: Yes (comment) Current home services: DME (cane, cpap) Criminal Activity/Legal Involvement Pertinent to Current Situation/Hospitalization: No - Comment as needed  Activities of Daily Living Home Assistive Devices/Equipment: CPAP, Cane (specify quad  or straight), Eyeglasses ADL Screening (condition at time of admission) Patient's cognitive ability adequate to safely complete daily activities?: Yes Is the patient deaf or have difficulty hearing?: No Does the patient have difficulty seeing, even when wearing glasses/contacts?: No Does the patient have difficulty concentrating, remembering, or making decisions?: No Patient able to express need for assistance with ADLs?: Yes Does the patient have difficulty dressing or bathing?: No Independently performs ADLs?: Yes (appropriate for developmental age) Does the patient have difficulty walking or climbing stairs?: Yes Weakness of Legs: Right Weakness of Arms/Hands: None  Permission Sought/Granted Permission sought to share information with : Case Manager Permission granted to share information with : Yes, Verbal Permission Granted  Share Information with NAME: Anwyn Pila [phone]           Emotional Assessment Appearance:: Appears stated age Attitude/Demeanor/Rapport: Gracious Affect (typically observed): Accepting Orientation: : Oriented to Self, Oriented to Place, Oriented to  Time, Oriented to Situation Alcohol / Substance Use: Not Applicable Psych Involvement: No (comment)  Admission diagnosis:  Morbid obesity with BMI of 50.0-59.9, adult (HCC) [E66.01, Z68.43] Patient Active Problem List   Diagnosis Date Noted   Morbid obesity with BMI of 50.0-59.9, adult (HCC) 09/14/2022   Gastric polyp 04/08/2022   Hyperlipidemia 05/20/2021   Lumbar radiculopathy 12/24/2020   Trigger little finger of left hand 12/16/2020   Medial epicondylitis of elbow, left 12/16/2020   Memory difficulties 12/11/2020  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   Globus sensation 11/15/2019   Gastroesophageal reflux disease 11/15/2019   Throat clearing 10/05/2019   Gout 08/31/2019   Phlebitis 08/31/2019   Prediabetes 08/31/2019   OA (osteoarthritis) of knee 05/17/2019   Acute  pain of right knee 05/17/2019   Abdominal pain 03/31/2019   Morbid obesity (HCC) 03/29/2018   GAD (generalized anxiety disorder) 02/27/2018   Mixed incontinence urge and stress 02/27/2018   OSA on CPAP 06/16/2017   Acute appendicitis 05/06/2016   Gastro-esophageal reflux disease with esophagitis 07/26/2011   Essential hypertension 07/26/2011   Osteoarthrosis 10/16/2009   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, including shoulder 08/22/2008   Pre-operative cardiovascular examination 08/22/2008   Anxiety 04/27/2003   PCP:  [name], DO Pharmacy:   [facility] [phone] - [place], [place] - [address] AT [facility] [address]  [place] [address] Phone: [phone] Fax: [phone]  [facility] [id number] - , [place] - [address] [place] [address] Phone: [phone] Fax: [phone]     Social Determinants of Health (SDOH) Social History: SDOH Screenings   Food Insecurity: No Food Insecurity (09/16/2022)  Housing: Patient Declined (09/16/2022)  Transportation Needs: No Transportation Needs (09/16/2022)  Utilities: Not At Risk (09/16/2022)  Alcohol Screen: Low Risk  (07/13/2022)  Depression (PHQ2-9): Low Risk  (01/21/2022)  Financial Resource Strain: Low Risk  (07/13/2022)  Physical Activity: Insufficiently Active (07/13/2022)  Social Connections: Moderately Isolated (07/13/2022)  Stress: No Stress Concern Present (07/13/2022)  Tobacco Use: Medium Risk (09/14/2022)   SDOH Interventions: Food Insecurity Interventions: Inpatient TOC Housing Interventions: Inpatient TOC Transportation Interventions: Inpatient TOC Utilities Interventions: Inpatient TOC   Readmission Risk Interventions     No data to display

## 2022-09-16 NOTE — Progress Notes (Signed)
Mobility Specialist - Progress Note   09/16/22 0913  Mobility  Activity Ambulated with assistance in hallway  Level of Assistance Standby assist, set-up cues, supervision of patient - no hands on  Assistive Device Cane  Distance Ambulated (ft) 250 ft  Activity Response Tolerated well  Mobility Referral Yes  $Mobility charge 1 Mobility  Mobility Specialist Start Time (ACUTE ONLY) [id number]  Mobility Specialist Stop Time (ACUTE ONLY) 0913  Mobility Specialist Time Calculation (min) (ACUTE ONLY) 10 min   Pt received in bed and agreeable to mobility. No complaints during session. Pt to bed after session with all needs met.    [facility]

## 2022-09-16 NOTE — Discharge Summary (Signed)
Physician Discharge Summary  Patient ID: Christine [name] MRN: [id number] DOB/AGE: 1950-11-24 72 y.o.  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  Did well but slowly advanced liquids.  Ready for discharge on Thursday AM.  Consults: none  Significant Diagnostic Studies: none    Discharge Exam: Blood pressure (!) 149/71, pulse 62, temperature 97.6 F (36.4 C), temperature source Oral, resp. rate 18, height 5' 1.5" (1.562 m), weight 131.4 kg, SpO2 97 %. Incisions bland  Disposition: Discharge disposition: 01-Home or Self Care       Discharge Instructions     Ambulate hourly while awake   Complete by: As directed    Ambulate hourly while awake   Complete by: As directed    Call MD for:  difficulty breathing, headache or visual disturbances   Complete by: As directed    Call MD for:  difficulty breathing, headache or visual disturbances   Complete by: As directed    Call MD for:  persistant dizziness or light-headedness   Complete by: As directed    Call MD for:  persistant dizziness or light-headedness   Complete by: As directed    Call MD for:  persistant nausea and vomiting   Complete by: As directed    Call MD for:  persistant nausea and vomiting   Complete by: As directed    Call MD for:  redness, tenderness, or signs of infection (pain, swelling, redness, odor or green/yellow discharge around incision site)   Complete by: As directed    Call MD for:  redness, tenderness, or signs of infection (pain, swelling, redness, odor or green/yellow discharge around incision site)   Complete by: As directed    Call MD for:  severe uncontrolled pain    Complete by: As directed    Call MD for:  severe uncontrolled pain   Complete by: As directed    Call MD for:  temperature >101 F   Complete by: As directed    Call MD for:  temperature >101 F   Complete by: As directed    Diet bariatric full liquid   Complete by: As directed    Diet bariatric full liquid   Complete by: As directed    Incentive spirometry   Complete by: As directed    Perform hourly while awake   Incentive spirometry   Complete by: As directed    Perform hourly while awake      Allergies as of 09/16/2022       Reactions   Prednisone Swelling   Legs swell    Tape Hives   Sulfa Antibiotics Rash        Medication List     STOP taking these medications    ibuprofen 800 MG tablet Commonly known as: ADVIL   triamterene-hydrochlorothiazide 75-50 MG tablet Commonly known as: MAXZIDE       TAKE these medications    acetaminophen 500 MG tablet Commonly known as: TYLENOL Take 2 tablets (1,000 mg total) by mouth every 8 (eight) hours for 5 days.   aspirin EC 81 MG tablet Take 81 mg by mouth daily.   augmented betamethasone dipropionate 0.05 % cream Commonly known as: DIPROLENE-AF  Apply 1 Application topically 2 (two) times daily.   DULoxetine 60 MG capsule Commonly known as: CYMBALTA Take 1 capsule (60 mg total) by mouth daily.   enoxaparin 60 MG/0.6ML injection Commonly known as: LOVENOX Inject 0.6 mLs (60 mg total) into the skin every 12 (twelve) hours for 14 days.   esomeprazole 40 MG capsule Commonly known as: NEXIUM Take 1 capsule (40 mg total) by mouth daily.   ferrous sulfate 325 (65 FE) MG EC tablet Take 325 mg by mouth daily with breakfast.   gabapentin 100 MG capsule Commonly known as: NEURONTIN Take 2 capsules (200 mg total) by mouth every 12 (twelve) hours.   methocarbamol 750 MG tablet Commonly known as: Robaxin-750 Take 1 tablet (750 mg total) by mouth every 6 (six) hours as needed for muscle spasms.   metoprolol  tartrate 50 MG tablet Commonly known as: LOPRESSOR Take 1 tablet (50 mg total) by mouth 2 (two) times daily.   ondansetron 4 MG disintegrating tablet Commonly known as: ZOFRAN-ODT Take 1 tablet (4 mg total) by mouth every 6 (six) hours as needed for nausea or vomiting.   oxybutynin 15 MG 24 hr tablet Commonly known as: DITROPAN XL TAKE ONE TABLET BY MOUTH EVERY NIGHT AT BEDTIME   oxyCODONE 5 MG immediate release tablet Commonly known as: Oxy IR/ROXICODONE Take 1 tablet (5 mg total) by mouth every 6 (six) hours as needed for severe pain.   pantoprazole 40 MG tablet Commonly known as: PROTONIX Take 1 tablet (40 mg total) by mouth daily.   potassium chloride 10 MEQ tablet Commonly known as: KLOR-CON Take 1 tablet (10 mEq total) by mouth daily.   rosuvastatin 10 MG tablet Commonly known as: Crestor Take 1 tablet (10 mg total) by mouth daily.        Follow-up Information     Stechschulte, Hyman Hopes, MD Follow up.   Specialty: Surgery Contact information: [address] [place] [address] [phone]                 Signed: Valarie Merino 09/16/2022, 8:46 AM

## 2022-09-17 ENCOUNTER — Other Ambulatory Visit: Payer: Self-pay | Admitting: Family Medicine

## 2022-09-17 ENCOUNTER — Encounter: Payer: Self-pay | Admitting: *Deleted

## 2022-09-17 ENCOUNTER — Telehealth: Payer: Self-pay | Admitting: *Deleted

## 2022-09-17 NOTE — Transitions of Care (Post Inpatient/ED Visit) (Signed)
09/17/2022  Name: Christine [name] MRN: [id number] DOB: Jan 22, 1951  Today's TOC FU Call Status: Today's TOC FU Call Status:: Successful TOC FU Call Competed TOC FU Call Complete Date: 09/17/22  Transition Care Management Follow-up Telephone Call Date of Discharge: 09/16/22 Discharge Facility: [name]) Type of Discharge: Inpatient Admission Primary Inpatient Discharge Diagnosis:: surgical gastric bypass/ endoscopy How have you been since you were released from the hospital?: Better ("I am doing fine; a little tired after the surgery but other than that, doing fine.  My sister is staying with me for a few days, and she is helping if I need anything, but I am pretty much back to my normal self") Any questions or concerns?: No  Items Reviewed: Did you receive and understand the discharge instructions provided?: Yes (thoroughly reviewed with patient who verbalizes good understanding of same) Medications obtained,verified, and reconciled?: Yes (Medications Reviewed) (Full medication reconciliation/ review completed; no concerns or discrepancies identified; confirmed patient obtained/ is taking all newly Rx'd medications as instructed; self-manages medications and denies questions/ concerns around medications today) Any new allergies since your discharge?: No Dietary orders reviewed?: Yes Type of Diet Ordered:: liquid, post-recent gastric bypass surgery Do you have support at home?: Yes People in Home: alone Name of Support/Comfort Primary Source: Reports independent in self-care activities; normally resides alone; supportive sister staying with patient after recent surgery and assists as/ if needed/ indicated  Medications Reviewed Today: Medications Reviewed Today     Reviewed by Michaela Corner, RN (Registered Nurse) on 09/17/22 at 1339  Med List Status: <None>   Medication Order Taking? Sig Documenting Provider Last Dose Status Informant  acetaminophen (TYLENOL) 500 MG tablet  [id number] Yes Take 2 tablets (1,000 mg total) by mouth every 8 (eight) hours for 5 days. Stechschulte, Hyman Hopes, MD Taking Active   aspirin 81 MG EC tablet [id number] Yes Take 81 mg by mouth daily. [provider] Taking Active Self           Med Note Michaela Corner   Fri Sep 17, 2022  1:32 PM) 09/17/22: Reports during [facility] call that this is on hold- taking Lovenox injections post-recent surgery  augmented betamethasone dipropionate (DIPROLENE-AF) 0.05 % cream [id number] Yes Apply 1 Application topically 2 (two) times daily. [provider] Taking Active Self  DULoxetine (CYMBALTA) 60 MG capsule [id number] Yes Take 1 capsule (60 mg total) by mouth daily. [name], DO Taking Active Self  enoxaparin (LOVENOX) 60 MG/0.6ML injection [id number] Yes Inject 0.6 mLs (60 mg total) into the skin every 12 (twelve) hours for 14 days. Stechschulte, Hyman Hopes, MD Taking Active   esomeprazole (NEXIUM) 40 MG capsule [id number] Yes Take 1 capsule (40 mg total) by mouth daily. [name], DO Taking Active Self           Med Note Michaela Corner   Fri Sep 17, 2022  1:35 PM) 09/17/22- reports during [facility] call today that this medication was replaced by Protonix- states not currently taking  ferrous sulfate 325 (65 FE) MG EC tablet [id number] Yes Take 325 mg by mouth daily with breakfast. [provider] Taking Active Self  gabapentin (NEURONTIN) 100 MG capsule [id number] Yes Take 2 capsules (200 mg total) by mouth every 12 (twelve) hours. Stechschulte, Hyman Hopes, MD Taking Active   methocarbamol (ROBAXIN-750) 750 MG tablet [id number] Yes Take 1 tablet (750 mg total) by mouth every 6 (six) hours as needed for muscle spasms. Stechschulte, Hyman Hopes, MD Taking Active  metoprolol tartrate (LOPRESSOR) 50 MG tablet [id number] Yes Take 1 tablet (50 mg total) by mouth 2 (two) times daily. [name], DO Taking Active   ondansetron (ZOFRAN-ODT) 4 MG disintegrating tablet [id number] Yes  Take 1 tablet (4 mg total) by mouth every 6 (six) hours as needed for nausea or vomiting. Stechschulte, Hyman Hopes, MD Taking Active   oxybutynin (DITROPAN XL) 15 MG 24 hr tablet [id number] Yes TAKE ONE TABLET BY MOUTH EVERY NIGHT AT BEDTIME [name], DO Taking Active   oxyCODONE (OXY IR/ROXICODONE) 5 MG immediate release tablet [id number] Yes Take 1 tablet (5 mg total) by mouth every 6 (six) hours as needed for severe pain. Stechschulte, Hyman Hopes, MD Taking Active   pantoprazole (PROTONIX) 40 MG tablet [id number] Yes Take 1 tablet (40 mg total) by mouth daily. Stechschulte, Hyman Hopes, MD Taking Active   potassium chloride (KLOR-CON) 10 MEQ tablet [id number] No Take 1 tablet (10 mEq total) by mouth daily.  Patient not taking: Reported on 08/30/2022   Carlisle Beers, FNP Not Taking Active Self           Med Note Michaela Corner   Fri Sep 17, 2022  1:36 PM) 09/17/22- reports during [facility] call, no longer taking  rosuvastatin (CRESTOR) 10 MG tablet [id number] Yes Take 1 tablet (10 mg total) by mouth daily. [name], DO Taking Active Self  triamterene-hydrochlorothiazide (MAXZIDE) 75-50 MG tablet [id number] Yes Take 1 tablet by mouth daily. [name], DO Taking Active Self           Med Note Michaela Corner   Fri Sep 17, 2022  1:39 PM) 5/24/24_ reports during [facility] call today- this medication is currently on hold after recent surgery            Home Care and Equipment/Supplies: Were Home Health Services Ordered?: No Any new equipment or medical supplies ordered?: No  Functional Questionnaire: Do you need assistance with bathing/showering or dressing?: No Do you need assistance with meal preparation?: No Do you need assistance with eating?: No Do you have difficulty maintaining continence: No Do you need assistance with getting out of bed/getting out of a chair/moving?: No Do you have difficulty managing or taking your medications?: No  Follow up appointments  reviewed: PCP Follow-up appointment confirmed?: Yes Date of PCP follow-up appointment?: 09/28/22 (verified this is recommended time frame for follow up per hospital discharging provider notes) Follow-up Provider: PCP, Dr. Carmelia Roller Specialist [facility] Follow-up appointment confirmed?: No Reason Specialist Follow-Up Not Confirmed: Patient has Specialist Provider Number and will Call for Appointment (reports tried to call to schedule popst-op visit-- the [facility] office is currently cosed for holiday weekend- verbalizes proactive plans to call on Tuesday 09/21/22 and this was encouraged) Do you need transportation to your follow-up appointment?: No Do you understand care options if your condition(s) worsen?: Yes-patient verbalized understanding  SDOH Interventions Today    Flowsheet Row Most Recent Value  SDOH Interventions   Food Insecurity Interventions Intervention Not Indicated  Transportation Interventions Intervention Not Indicated  [drives self]      TOC Interventions Today    Flowsheet Row Most Recent Value  TOC Interventions   TOC Interventions Discussed/Reviewed TOC Interventions Discussed      Interventions Today    Flowsheet Row Most Recent Value  Chronic Disease   Chronic disease during today's visit Other  General Interventions   General Interventions Discussed/Reviewed General Interventions Discussed, Doctor Visits, Durable Medical Equipment (DME)  Doctor Visits Discussed/Reviewed Doctor  Visits Discussed, Specialist, PCP  Durable Medical Equipment (DME) Other  [uses cane at baseline]  PCP/Specialist Visits Compliance with follow-up visit  Nutrition Interventions   Nutrition Discussed/Reviewed Nutrition Discussed  Pharmacy Interventions   Pharmacy Dicussed/Reviewed Pharmacy Topics Discussed  [Full medication review with updating medication list in EHR per patient report]      Caryl Pina, RN, BSN, CCRN Alumnus RN CM Care Coordination/ Transition of Care- [facility]  Care Management [phone]: direct office

## 2022-09-21 DIAGNOSIS — G4733 Obstructive sleep apnea (adult) (pediatric): Secondary | ICD-10-CM | POA: Diagnosis not present

## 2022-09-24 NOTE — Telephone Encounter (Signed)
1. Tell me about your pain and pain management?     Pt denies any pain.  Stated that she hasn't taken pain medications since she was discharged    2. Let's talk about fluid intake. How much total fluid are you taking in?     Pt states that s/he is working to meet goal of 64 oz of fluid today. Pt stated that she would often fall asleep, for example she dosed off around 1pm and woke up at night time. Pt plans to increase clear liquids to meet fluid goals. Encouraged pt to set timer reminders to help meet fluid goals Pt encouraged to continue to work towards meeting goal. Pt instructed to assess status and suggestions daily utilizing Hydration Action Plan on discharge folder and to call [facility] if in the "red zone".     3. How much protein have you taken in the last day?     Pt states she is working towards meeting the goal of 60g of protein each day with the protein shakes and but is only getting in about 40g    4. Has the frequency or color changed with your urine?   Pt states that s/he is urinating "fine" with no changes in frequency or urgency.   5. Tell me what your incisions look like?   "Incisions look fine". Pt denies a fever, chills. Pt states incisions are not swollen, open, or draining. Pt encouraged to call [facility] if incisions change.     6. Have you been passing gas? BM?   Pt states that they are having BMs.   Pt instructed to take either Miralax or MoM as instructed per "Gastric Bypass/Sleeve Discharge Home Care Instructions". Pt to call surgeon's office if not able to have BM with medication.    7. If a problem or question were to arise who would you call? Do you know contact numbers for [facility], [facility], and [facility]?   Pt knows to call [facility] for surgical, [facility] for nutrition, and [facility] for non-urgent questions or concerns. Pt denies dehydration symptoms. Pt can describe s/sx of dehydration.   8. How has the walking going?   Pt states s/he is walking around and able to be active  without difficulty.    9. How are your vitamins and calcium going? How are you taking them?    Pt states that s/he is taking his/her supplements and vitamins without difficulty.

## 2022-09-28 ENCOUNTER — Encounter: Payer: Self-pay | Admitting: Family Medicine

## 2022-09-28 ENCOUNTER — Encounter: Payer: Medicare HMO | Attending: Surgery | Admitting: Skilled Nursing Facility1

## 2022-09-28 ENCOUNTER — Encounter: Payer: Self-pay | Admitting: Skilled Nursing Facility1

## 2022-09-28 ENCOUNTER — Ambulatory Visit (INDEPENDENT_AMBULATORY_CARE_PROVIDER_SITE_OTHER): Payer: Medicare HMO | Admitting: Family Medicine

## 2022-09-28 VITALS — BP 130/85 | HR 69 | Temp 98.2°F | Ht 61.0 in | Wt 287.5 lb

## 2022-09-28 DIAGNOSIS — Z980 Intestinal bypass and anastomosis status: Secondary | ICD-10-CM | POA: Diagnosis not present

## 2022-09-28 DIAGNOSIS — Z713 Dietary counseling and surveillance: Secondary | ICD-10-CM | POA: Insufficient documentation

## 2022-09-28 DIAGNOSIS — I10 Essential (primary) hypertension: Secondary | ICD-10-CM

## 2022-09-28 DIAGNOSIS — G4730 Sleep apnea, unspecified: Secondary | ICD-10-CM | POA: Insufficient documentation

## 2022-09-28 DIAGNOSIS — E785 Hyperlipidemia, unspecified: Secondary | ICD-10-CM | POA: Insufficient documentation

## 2022-09-28 DIAGNOSIS — K219 Gastro-esophageal reflux disease without esophagitis: Secondary | ICD-10-CM | POA: Insufficient documentation

## 2022-09-28 DIAGNOSIS — Z903 Acquired absence of stomach [part of]: Secondary | ICD-10-CM

## 2022-09-28 LAB — CBC
HCT: 31.8 % — ABNORMAL LOW (ref 36.0–46.0)
Hemoglobin: 9.9 g/dL — ABNORMAL LOW (ref 12.0–15.0)
MCHC: 31.2 g/dL (ref 30.0–36.0)
MCV: 86.9 fl (ref 78.0–100.0)
Platelets: 283 10*3/uL (ref 150.0–400.0)
RBC: 3.66 Mil/uL — ABNORMAL LOW (ref 3.87–5.11)
RDW: 17.5 % — ABNORMAL HIGH (ref 11.5–15.5)
WBC: 4.7 10*3/uL (ref 4.0–10.5)

## 2022-09-28 LAB — BASIC METABOLIC PANEL
BUN: 18 mg/dL (ref 6–23)
CO2: 23 mEq/L (ref 19–32)
Calcium: 8.9 mg/dL (ref 8.4–10.5)
Chloride: 107 mEq/L (ref 96–112)
Creatinine, Ser: 0.74 mg/dL (ref 0.40–1.20)
GFR: 82.47 mL/min (ref >60.00)
Glucose, Bld: 83 mg/dL (ref 70–99)
Potassium: 3.9 mEq/L (ref 3.5–5.1)
Sodium: 141 mEq/L (ref 135–145)

## 2022-09-28 MED ORDER — TRIAMTERENE-HCTZ 37.5-25 MG PO TABS
1.0000 | ORAL_TABLET | Freq: Every day | ORAL | 3 refills | Status: DC
Start: 2022-09-28 — End: 2023-02-04

## 2022-09-28 NOTE — Patient Instructions (Addendum)
Give [place] 2-3 business days to get the results of your labs back.   Keep an eye on your blood pressure at home.   Please contact the GI team at: [phone]  Let us know if you need anything.

## 2022-09-28 NOTE — Progress Notes (Signed)
Chief Complaint  Patient presents with   Hospitalization Follow-up    Subjective: Patient is a 72 y.o. female here for hosp f/u.  Patiently had a Roux-en-Y procedure done on 09/14/2022.  She spent 2 nights in the hospital following this for recovery.  Currently she is pretty pain-free.  Frustrated with the liquid diet but overall doing well.  Had her Maxzide stopped and unsure why. Has not been monitoring her BP at home but does have a monitor. She is starting to swell again and would like to go back on it.   Past Medical History:  Diagnosis Date   Allergy    dust, pollen, sulfa, prednisone   Anemia    Anxiety    Arthritis    "knees" (04/26/2016)   Colon polyps    benign per pt   Coronary artery disease    mild per 2015 cath in [place] (OM1 30%, RCA 30%)   GERD (gastroesophageal reflux disease)    Gout    History of hiatal hernia    Hyperlipidemia 05/20/2021   Hypertension    Migraine    "none since early /2017" (05/06/2016)   Obesity    OSA on CPAP    uses CPAP   Pre-diabetes    Pre-operative cardiovascular examination 08/22/2008   Vasculitis (HCC)    Bilateral    Objective: BP 130/85 (BP Location: Left Arm, Patient Position: Sitting, Cuff Size: Large)   Pulse 69   Temp 98.2 F (36.8 C) (Oral)   Ht 5\' 1"  (1.549 m)   Wt 287 lb 8 oz (130.4 kg)   SpO2 98%   BMI 54.32 kg/m  General: Awake, appears stated age Heart: RRR, 3+ pitting b/l LE edema tapering at knees b/l MSK: Diffuse ttp over the LE's b/l  Lungs: CTAB, no rales, wheezes or rhonchi. No accessory muscle use Psych: Age appropriate judgment and insight, normal affect and mood  Assessment and Plan: S/P total gastrectomy and Roux-en-Y esophagojejunal anastomosis - Plan: CBC, Basic metabolic panel  Essential hypertension - Plan: triamterene-hydrochlorothiazide (MAXZIDE-25) 37.5-25 MG tablet  Ck labs. Has appt w bariatric surg team next week . Restart Maxzide at 37.5-25 mg/d as she has not been eating like  she was pre-surgery. She will monitor BP at home. If it drops or rises higher than 140/90, she will return to the clinic or let us know.  F/u as originally scheduled or as needed. The patient voiced understanding and agreement to the plan.  Jilda Roche Kemp, DO 09/28/22  12:17 PM

## 2022-09-28 NOTE — Progress Notes (Signed)
2 Week Post-Operative Nutrition Class   Patient was seen on 09/28/2022 for Post-Operative Nutrition education at the [facility].    Surgery date: 09/14/2022 Surgery type: RYGB Start weight at [facility]: 311.2 Bowel Habits: Every day to every other day no complaints  Clinical  Medical hx: Anemia, anxiety, arthritis, GERD, Hyperlipidemia, HTN, sleep apnea Medications: Esomeprazole, rosuvastatin, duloxetine, ibuprofen, metoprolol tartrate, oxybutynin, triamterene HCTZ  Labs: A1C 6.4; glucose 106; hemoglobin 10.6; hematocrit 33.2 Notable signs/symptoms: none noted Any previous deficiencies? No      The following the learning objectives were met by the patient during this course: Identifies Soft Prepped Plan Advancement Guide  Identifies Soft, High Proteins (Phase 1), beginning 2 weeks post-operatively to 3 weeks post-operatively Identifies Additional Soft High Proteins, soft non-starchy vegetables, fruits and starches (Phase 2), beginning 3 weeks post-operatively to 3 months post-operatively Identifies appropriate sources of fluids, proteins, vegetables, fruits and starches Identifies appropriate fat sources and healthy verses unhealthy fat types   States protein, vegetable, fruit and starch recommendations and appropriate sources post-operatively Identifies the need for appropriate texture modifications, mastication, and bite sizes when consuming solids Identifies appropriate fat consumption and sources Identifies appropriate multivitamin and calcium sources post-operatively Describes the need for physical activity post-operatively and will follow MD recommendations States when to call healthcare provider regarding medication questions or post-operative complications   Handouts given during class include: Soft Prepped Plan Advancement Guide   Follow-Up Plan: Patient will follow-up at [facility] in 10 weeks for 3 month post-op nutrition visit for diet advancement  per MD.

## 2022-09-29 ENCOUNTER — Encounter: Payer: Self-pay | Admitting: Family Medicine

## 2022-10-01 ENCOUNTER — Ambulatory Visit (INDEPENDENT_AMBULATORY_CARE_PROVIDER_SITE_OTHER): Payer: Medicare HMO | Admitting: Pulmonary Disease

## 2022-10-01 ENCOUNTER — Encounter (HOSPITAL_BASED_OUTPATIENT_CLINIC_OR_DEPARTMENT_OTHER): Payer: Self-pay | Admitting: Pulmonary Disease

## 2022-10-01 VITALS — BP 144/82 | HR 107 | Temp 98.0°F | Ht 61.0 in | Wt 286.2 lb

## 2022-10-01 DIAGNOSIS — G4733 Obstructive sleep apnea (adult) (pediatric): Secondary | ICD-10-CM

## 2022-10-01 DIAGNOSIS — E669 Obesity, unspecified: Secondary | ICD-10-CM

## 2022-10-01 DIAGNOSIS — G4730 Sleep apnea, unspecified: Secondary | ICD-10-CM | POA: Diagnosis not present

## 2022-10-01 NOTE — Patient Instructions (Signed)
Follow up in 1 year.

## 2022-10-01 NOTE — Progress Notes (Signed)
[facility]  Chief Complaint  Patient presents with   Follow-up    Follow up. Patient has no complaints.     Constitutional:  BP (!) 144/82 (BP Location: Left Wrist, Patient Position: Sitting, Cuff Size: Normal)   Pulse (!) 107   Temp 98 F (36.7 C) (Oral)   Ht 5\' 1"  (1.549 m)   Wt 286 lb 3.2 oz (129.8 kg)   SpO2 95%   BMI 54.08 kg/m   Past Medical History:  Migraine HA, HTN, HH, GERD, Gout, CAD, Colon polyps, OA, Anxiety, Anemia  Past Surgical History:  She  has a past surgical history that includes Tonsillectomy; Abdominal hysterectomy; Cardiac catheterization (~ 2015); laparoscopic appendectomy (N/A, 05/06/2016); Cardiac catheterization (2015); Chilectomy (Right, 06/01/2017); Appendectomy (05/06/2016); Esophagogastroduodenoscopy (egd) with propofol (N/A, 04/08/2022); polypectomy (04/08/2022); and biopsy (04/08/2022).  Brief Summary:  Christine [name] is a 72 y.o. female former smoker with obstructive sleep apnea.      Subjective:   She had gastric bypass in May.  Was having lots of knee pain and wasn't making any progress with weight loss otherwise.  Uses CPAP nightly.  No issues with mask fit or pressure setting.  Sleeping well and feels rested during the day.  Physical Exam:   Appearance - well kempt, uses a cane  ENMT - no sinus tenderness, no oral exudate, no LAN, Mallampati 3 airway, no stridor  Respiratory - equal breath sounds bilaterally, no wheezing or rales  CV - s1s2 regular rate and rhythm, no murmurs  Ext - no clubbing, no edema  Skin - no rashes  Psych - normal mood and affect     Sleep Tests:  HST 08/31/17 >> AHI 17.8, SaO2 low 88% Auto CPAP 07/02/22 to 09/29/22 >> used on 82 of 90 nights with average 6 hrs 24 min.  Average AHI 2.2 with median CPAP 9 and 95 th percentile CPAP 12 cm H2O  Cardiac Tests:  Echo 12/03/19 >> EF 60 to 65%, grade 1 DD, mild LA dilation, mild MR  Social History:  She   reports that she quit smoking about 44 years ago. Her smoking use included cigarettes. She has a 0.60 pack-year smoking history. She has never used smokeless tobacco. She reports that she does not drink alcohol and does not use drugs.  Family History:  Her family history includes Alcohol abuse in her brother; Arthritis in her mother and sister; Asthma in her father and mother; Diabetes in her father, mother, sister, and sister; Drug abuse in her brother; Heart disease in her father and mother; High blood pressure in her father and mother; Varicose Veins in her sister.     Assessment/Plan:   Obstructive sleep apnea. - she is compliant with CPAP and reports benefit from therapy - using [employer] for her DME - she should be eligible for a new CPAP machine in 2025 - continue auto CPAP 5 to 15 cm H2O  Obesity. - she had robotic gastric bypass with Dr. Dossie Der on 09/14/22 - she is hopeful to eventually get her weight down to 150 lbs  Time Spent Involved in Patient Care on Day of Examination:  16 minutes  Follow up:   Patient Instructions  Follow up in 1 year  Medication List:   Allergies as of 10/01/2022       Reactions   Prednisone Swelling   Legs swell    Tape Hives   Sulfa Antibiotics Rash        Medication List  Accurate as of October 01, 2022  3:09 PM. If you have any questions, ask your nurse or doctor.          augmented betamethasone dipropionate 0.05 % cream Commonly known as: DIPROLENE-AF Apply 1 Application topically 2 (two) times daily.   DULoxetine 60 MG capsule Commonly known as: CYMBALTA Take 1 capsule (60 mg total) by mouth daily.   enoxaparin 60 MG/0.6ML injection Commonly known as: LOVENOX Inject 0.6 mLs (60 mg total) into the skin every 12 (twelve) hours for 14 days.   esomeprazole 40 MG capsule Commonly known as: NEXIUM Take 1 capsule (40 mg total) by mouth daily.   ferrous sulfate 325 (65 FE) MG EC tablet Take 325 mg by mouth daily  with breakfast.   metoprolol tartrate 50 MG tablet Commonly known as: LOPRESSOR Take 1 tablet (50 mg total) by mouth 2 (two) times daily.   oxybutynin 15 MG 24 hr tablet Commonly known as: DITROPAN XL Take 15 mg by mouth at bedtime.   pantoprazole 40 MG tablet Commonly known as: PROTONIX Take 1 tablet (40 mg total) by mouth daily.   rosuvastatin 10 MG tablet Commonly known as: Crestor Take 1 tablet (10 mg total) by mouth daily.   triamterene-hydrochlorothiazide 37.5-25 MG tablet Commonly known as: MAXZIDE-25 Take 1 tablet by mouth daily.        Signature:  Coralyn Helling, MD [facility] Pager - [phone] 10/01/2022, 3:09 PM

## 2022-10-06 ENCOUNTER — Telehealth: Payer: Self-pay | Admitting: Family Medicine

## 2022-10-06 ENCOUNTER — Ambulatory Visit (INDEPENDENT_AMBULATORY_CARE_PROVIDER_SITE_OTHER): Payer: Medicare HMO

## 2022-10-06 ENCOUNTER — Other Ambulatory Visit: Payer: Self-pay

## 2022-10-06 ENCOUNTER — Ambulatory Visit (HOSPITAL_COMMUNITY)
Admission: EM | Admit: 2022-10-06 | Discharge: 2022-10-06 | Disposition: A | Discharge status: Home / Self Care | Payer: Medicare HMO | Attending: Internal Medicine | Admitting: Internal Medicine

## 2022-10-06 ENCOUNTER — Encounter (HOSPITAL_COMMUNITY): Payer: Self-pay | Admitting: Emergency Medicine

## 2022-10-06 DIAGNOSIS — M79671 Pain in right foot: Secondary | ICD-10-CM

## 2022-10-06 NOTE — Telephone Encounter (Signed)
Called and scheduled the patient appt for Friday 10/08/22 at 2:15 Same day appt. Informed the patient is things do worsen before Friday to go to the ED. She did verbalize agreement

## 2022-10-06 NOTE — Telephone Encounter (Signed)
Pt called to speak to CMA and said it was an emergency. Asked what the call was in reference to and she advised that she has bad pain in her foot and hasn't been able to walk for 3 days. Pt went to [facility] and they advised her to follow up with her PCP. She was advised no openings in our office today with any providers. Pt still wants to speak to Robin as Dr. Carmelia Roller has been able to work her in in the past. Please call to advise

## 2022-10-06 NOTE — ED Provider Notes (Signed)
[facility]   [id number] 10/06/22 Arrival Time: 1149  ASSESSMENT & PLAN:  1. Foot pain, right    -X-ray negative for any acute bony abnormality per my read.  This is likely related to pressure from her venous stasis disease of the lower extremity.  She is already on a low-dose of a diuretic triamterene.  I recommended she increase her dosage to double for the next few days to remove some of the swelling.  Weight bearing as tolerated.  Follow-up with her primary care doctor in 1 week.  All questions answered she agrees to plan.  No orders of the defined types were placed in this encounter.  Discharge Instructions   None     Follow-up Information     [name], DO In 1 week.   Specialty: Family Medicine Why: If symptoms worsen Contact information: [address] [place] [address] [phone]                  Reviewed expectations re: course of current medical issues. Questions answered. Outlined signs and symptoms indicating need for more acute intervention. Patient verbalized understanding. After Visit Summary given.   SUBJECTIVE: Pleasant 72 year old female comes urgent care to be evaluated for right foot pain.  Is been hurting for about 3 days.  Is most painful over the lateral aspect of the foot.  It is a constant dull ache there.  It is made worse with weightbearing.  No inciting injury or trauma.  She is try taking Tylenol which has helped some.  She does have a history of chronic venous stasis in bilat lower extremity.   No LMP recorded. Patient has had a hysterectomy. Past Surgical History:  Procedure Laterality Date   ABDOMINAL HYSTERECTOMY     "partial"; both ovaries present   APPENDECTOMY  05/06/2016   BIOPSY  04/08/2022   Procedure: BIOPSY;  Surgeon: Sherrilyn Rist, MD;  Location: [facility];  Service: Gastroenterology;;   CARDIAC CATHETERIZATION  ~ 2015   CARDIAC CATHETERIZATION  2015   In [place]    CHILECTOMY Right 06/01/2017   Procedure: CHILECTOMY RIGHT FOOT;  Surgeon: Felecia Shelling, DPM;  Location: [facility];  Service: Podiatry;  Laterality: Right;   ESOPHAGOGASTRODUODENOSCOPY (EGD) WITH PROPOFOL N/A 04/08/2022   Procedure: ESOPHAGOGASTRODUODENOSCOPY (EGD) WITH PROPOFOL;  Surgeon: Sherrilyn Rist, MD;  Location: [facility];  Service: Gastroenterology;  Laterality: N/A;   LAPAROSCOPIC APPENDECTOMY N/A 05/06/2016   Procedure: LAPAROSCOPIC APPENDECTOMY;  Surgeon: Manus Rudd, MD;  Location: [facility];  Service: General;  Laterality: N/A;   POLYPECTOMY  04/08/2022   Procedure: POLYPECTOMY;  Surgeon: Sherrilyn Rist, MD;  Location: [facility];  Service: Gastroenterology;;   TONSILLECTOMY       OBJECTIVE:  Vitals:   10/06/22 1238  BP: (!) 150/74  Pulse: 80  Resp: 20  Temp: 98.3 F (36.8 C)  TempSrc: Oral  SpO2: 99%  Weight: 130 kg  Height: 5\' 1"  (1.549 m)     Physical Exam Vitals and nursing note reviewed.  Constitutional:      Appearance: Normal appearance. She is not ill-appearing.  Cardiovascular:     Rate and Rhythm: Normal rate.  Pulmonary:     Effort: Pulmonary effort is normal.  Musculoskeletal:     Comments: RLE - there is changes of chronic venous stasis in the right lower extremity.  There is swelling in the sinus tarsi.  There is a well-healed old surgical scar over the  first metatarsal.  There are no overlying skin changes over the lateral aspect of the foot.  She is tender to palpation at the fifth metatarsal head.  Nontender palpation at the ankle, or at the navicular.  Range of motion ankle is limited by pain.  She is neurovascular intact distally.      Labs: Results for orders placed or performed in visit on 09/28/22  Fecal Occult Blood, Guaiac  Result Value Ref Range   Fecal Occult Blood Negative    Labs Reviewed - No data to display  Imaging: No results found.   Allergies  Allergen Reactions   Prednisone Swelling    Legs swell     Tape Hives   Sulfa Antibiotics Rash                                               Past Medical History:  Diagnosis Date   Allergy    dust, pollen, sulfa, prednisone   Anemia    Anxiety    Arthritis    "knees" (04/26/2016)   Colon polyps    benign per pt   Coronary artery disease    mild per 2015 cath in [place] (OM1 30%, RCA 30%)   GERD (gastroesophageal reflux disease)    Gout    History of hiatal hernia    Hyperlipidemia 05/20/2021   Hypertension    Migraine    "none since early /2017" (05/06/2016)   Obesity    OSA on CPAP    uses CPAP   Pre-diabetes    Pre-operative cardiovascular examination 08/22/2008   Vasculitis (HCC)    Bilateral    Social History   Socioeconomic History   Marital status: Single    Spouse name: Not on file   Number of children: 0   Years of education: Not on file   Highest education level: Bachelor's degree (e.g., BA, AB, BS)  Occupational History   Occupation: retired  Tobacco Use   Smoking status: Former    Packs/day: 0.12    Years: 5.00    Additional pack years: 0.00    Total pack years: 0.60    Types: Cigarettes    Quit date: 04/26/1978    Years since quitting: 44.4   Smokeless tobacco: Never  Vaping Use   Vaping Use: Never used  Substance and Sexual Activity   Alcohol use: No   Drug use: No   Sexual activity: Not Currently    Birth control/protection: None  Other Topics Concern   Not on file  Social History Narrative   Right handed    Lives alone with dog    Social Determinants of Health   Financial Resource Strain: Low Risk  (07/13/2022)   Overall Financial Resource Strain (CARDIA)    Difficulty of Paying Living Expenses: Not very hard  Food Insecurity: No Food Insecurity (09/17/2022)   Hunger Vital Sign    Worried About Running Out of Food in the Last Year: Never true    Ran Out of Food in the Last Year: Never true  Transportation Needs: No Transportation Needs (09/17/2022)   PRAPARE - Scientist, research (physical sciences) (Medical): No    Lack of Transportation (Non-Medical): No  Physical Activity: Insufficiently Active (07/13/2022)   Exercise Vital Sign    Days of Exercise per Week: 3 days    Minutes of Exercise per  Session: 20 min  Stress: No Stress Concern Present (07/13/2022)   [employer] of Occupational Health - Occupational Stress Questionnaire    Feeling of Stress : Not at all  Social Connections: Moderately Isolated (07/13/2022)   Social Connection and Isolation Panel [NHANES]    Frequency of Communication with Friends and Family: Three times a week    Frequency of Social Gatherings with Friends and Family: Once a week    Attends Religious Services: 1 to 4 times per year    Active Member of [employer] or Organizations: No    Attends Engineer, structural: Not on file    Marital Status: Never married  Intimate Partner Violence: Not At Risk (09/16/2022)   Humiliation, Afraid, Rape, and Kick questionnaire    Fear of Current or Ex-Partner: No    Emotionally Abused: No    Physically Abused: No    Sexually Abused: No    Family History  Problem Relation Age of Onset   Diabetes Mother    Heart disease Mother    High blood pressure Mother    Asthma Mother    Arthritis Mother    Diabetes Father    Heart disease Father    High blood pressure Father    Asthma Father    Diabetes Sister    Alcohol abuse Brother    Drug abuse Brother    Arthritis Sister    Diabetes Sister    Varicose Veins Sister    Colon cancer Neg Hx    Esophageal cancer Neg Hx    Rectal cancer Neg Hx       Tanuj Mullens, Baldemar Friday, MD 10/06/22 1427

## 2022-10-06 NOTE — ED Triage Notes (Signed)
Pt c/o right foot pain for the past 2 days, not getting better with tyl.

## 2022-10-08 ENCOUNTER — Ambulatory Visit: Payer: Medicare HMO | Admitting: Family Medicine

## 2022-10-08 ENCOUNTER — Telehealth: Payer: Self-pay | Admitting: Skilled Nursing Facility1

## 2022-10-08 NOTE — Telephone Encounter (Signed)
RD called pt to verify fluid intake once starting soft, solid proteins 2 week post-bariatric surgery.   Daily Fluid intake: 32 oz Daily Protein intake: 60+ Bowel Habits: daily without   Concerns/issues:   Pt states her doctor took her off her fluid pill resulting in swelling in lower limbs and then started urinating with frequency. Pt states her leg/foot was hurting so bad she took Ibuprofen stating she knows she should not have but was in too much pain. Pt states she sees her primary care today. Pt states she eats [place].  Pt states she can tolerate all things including chicken and reheating meats in the microwave.    Advice offered: Consume 2 protein shakes daily to ensure needs are met during this medical issue with your leg   Bring Dr. Dossie Der in on the conversation about your pain management   Dietitian will call you next week as well to ensure your needs are being met  Try your best to get in 3 bottles of water, do not get under hydrated   Avoid eating at [employer]

## 2022-10-08 NOTE — Telephone Encounter (Signed)
RD called pt to verify fluid intake once starting soft, solid proteins 2 week post-bariatric surgery.   Daily Fluid intake:  Daily Protein intake: Bowel Habits:   Concerns/issues:    LVM 

## 2022-10-11 ENCOUNTER — Encounter: Payer: Self-pay | Admitting: Family Medicine

## 2022-10-11 ENCOUNTER — Telehealth (INDEPENDENT_AMBULATORY_CARE_PROVIDER_SITE_OTHER): Payer: Medicare HMO | Admitting: Family Medicine

## 2022-10-11 DIAGNOSIS — T50905A Adverse effect of unspecified drugs, medicaments and biological substances, initial encounter: Secondary | ICD-10-CM | POA: Diagnosis not present

## 2022-10-11 DIAGNOSIS — I10 Essential (primary) hypertension: Secondary | ICD-10-CM

## 2022-10-11 NOTE — Progress Notes (Signed)
Chief Complaint  Patient presents with   Medication Problem    Subjective: Patient is a 72 y.o. female here for f/u. We are interacting via web portal for an electronic face-to-face visit. I verified patient's ID using 2 identifiers. Patient agreed to proceed with visit via this method. Patient is at home, I am at office. Patient and I are present for visit.   Pt is s/p bariatric surg. She had some foot pain after restarting her diuretic. This happened before when she had volume depletion from diarrhea. She tried to seek care but found no relief. She took ibuprofen and got in some trouble with her surgeon. Started on Voltaren gel and notices marked improvement. Swelling is better. Going to restart the BP med if we agree.   Past Medical History:  Diagnosis Date   Allergy    dust, pollen, sulfa, prednisone   Anemia    Anxiety    Arthritis    "knees" (04/26/2016)   Colon polyps    benign per pt   Coronary artery disease    mild per 2015 cath in [place] (OM1 30%, RCA 30%)   GERD (gastroesophageal reflux disease)    Gout    History of hiatal hernia    Hyperlipidemia 05/20/2021   Hypertension    Migraine    "none since early /2017" (05/06/2016)   Obesity    OSA on CPAP    uses CPAP   Pre-diabetes    Pre-operative cardiovascular examination 08/22/2008   Vasculitis (HCC)    Bilateral    Objective: No conversational dyspnea Age appropriate judgment and insight Nml affect and mood  Assessment and Plan: Adverse effect of drug, initial encounter  Essential hypertension  Adverse effect of med. Pleased w result from Voltaren gel. Cont that. Gout is consideration from this. She will restart the Maxzide 37.5-25 mg/d and let me know how things go as her swelling had improved. Elevate legs as needed. She will monitor BP at home. She will keep me appraised on MyChart if she has issues. Pt has lost 30 lbs since her surgery and may not cont to need BP lowering meds.  The patient voiced  understanding and agreement to the plan.  Jilda Roche Jerome, DO 10/11/22  7:47 AM

## 2022-10-15 ENCOUNTER — Emergency Department (HOSPITAL_COMMUNITY)
Admission: EM | Admit: 2022-10-15 | Discharge: 2022-10-16 | Disposition: A | Discharge status: Home / Self Care | Payer: Medicare HMO | Attending: Emergency Medicine | Admitting: Emergency Medicine

## 2022-10-15 ENCOUNTER — Other Ambulatory Visit: Payer: Self-pay

## 2022-10-15 ENCOUNTER — Emergency Department (HOSPITAL_COMMUNITY): Payer: Medicare HMO

## 2022-10-15 DIAGNOSIS — Z79899 Other long term (current) drug therapy: Secondary | ICD-10-CM | POA: Diagnosis not present

## 2022-10-15 DIAGNOSIS — I10 Essential (primary) hypertension: Secondary | ICD-10-CM | POA: Diagnosis not present

## 2022-10-15 DIAGNOSIS — N281 Cyst of kidney, acquired: Secondary | ICD-10-CM | POA: Diagnosis not present

## 2022-10-15 DIAGNOSIS — R1084 Generalized abdominal pain: Secondary | ICD-10-CM | POA: Diagnosis not present

## 2022-10-15 DIAGNOSIS — R600 Localized edema: Secondary | ICD-10-CM | POA: Insufficient documentation

## 2022-10-15 DIAGNOSIS — R112 Nausea with vomiting, unspecified: Secondary | ICD-10-CM | POA: Diagnosis not present

## 2022-10-15 DIAGNOSIS — K449 Diaphragmatic hernia without obstruction or gangrene: Secondary | ICD-10-CM | POA: Diagnosis not present

## 2022-10-15 DIAGNOSIS — R109 Unspecified abdominal pain: Secondary | ICD-10-CM | POA: Diagnosis not present

## 2022-10-15 DIAGNOSIS — Z9884 Bariatric surgery status: Secondary | ICD-10-CM

## 2022-10-15 DIAGNOSIS — K5730 Diverticulosis of large intestine without perforation or abscess without bleeding: Secondary | ICD-10-CM | POA: Diagnosis not present

## 2022-10-15 LAB — CBC
HCT: 37 % (ref 36.0–46.0)
Hemoglobin: 10.9 g/dL — ABNORMAL LOW (ref 12.0–15.0)
MCH: 25.8 pg — ABNORMAL LOW (ref 26.0–34.0)
MCHC: 29.5 g/dL — ABNORMAL LOW (ref 30.0–36.0)
MCV: 87.5 fL (ref 80.0–100.0)
Platelets: 351 10*3/uL (ref 150–400)
RBC: 4.23 MIL/uL (ref 3.87–5.11)
RDW: 16.6 % — ABNORMAL HIGH (ref 11.5–15.5)
WBC: 4.5 10*3/uL (ref 4.0–10.5)
nRBC: 0 % (ref 0.0–0.2)

## 2022-10-15 LAB — COMPREHENSIVE METABOLIC PANEL
ALT: 19 U/L (ref 0–44)
AST: 23 U/L (ref 15–41)
Albumin: 3.5 g/dL (ref 3.5–5.0)
Alkaline Phosphatase: 59 U/L (ref 38–126)
Anion gap: 11 (ref 5–15)
BUN: 17 mg/dL (ref 8–23)
CO2: 19 mmol/L — ABNORMAL LOW (ref 22–32)
Calcium: 9.2 mg/dL (ref 8.9–10.3)
Chloride: 109 mmol/L (ref 98–111)
Creatinine, Ser: 0.94 mg/dL (ref 0.44–1.00)
GFR, Estimated: >60 mL/min (ref >60)
Glucose, Bld: 120 mg/dL — ABNORMAL HIGH (ref 70–99)
Potassium: 3.6 mmol/L (ref 3.5–5.1)
Sodium: 139 mmol/L (ref 135–145)
Total Bilirubin: 0.9 mg/dL (ref 0.3–1.2)
Total Protein: 7.9 g/dL (ref 6.5–8.1)

## 2022-10-15 LAB — LIPASE, BLOOD: Lipase: 24 U/L (ref 11–51)

## 2022-10-15 MED ORDER — LACTATED RINGERS IV BOLUS
500.0000 mL | Freq: Once | INTRAVENOUS | Status: AC
  Administered 2022-10-15: 500 mL via INTRAVENOUS

## 2022-10-15 MED ORDER — IOHEXOL 300 MG/ML  SOLN
100.0000 mL | Freq: Once | INTRAMUSCULAR | Status: AC | PRN
  Administered 2022-10-15: 100 mL via INTRAVENOUS

## 2022-10-15 MED ORDER — ONDANSETRON HCL 4 MG/2ML IJ SOLN
4.0000 mg | Freq: Once | INTRAMUSCULAR | Status: AC | PRN
  Administered 2022-10-15: 4 mg via INTRAVENOUS
  Filled 2022-10-15: qty 2

## 2022-10-15 MED ORDER — METOCLOPRAMIDE HCL 10 MG PO TABS: 10.0000 mg | ORAL_TABLET | Freq: Four times a day (QID) | ORAL | 0 refills | Status: DC

## 2022-10-15 MED ORDER — METOCLOPRAMIDE HCL 10 MG PO TABS
10.0000 mg | ORAL_TABLET | ORAL | Status: AC
  Administered 2022-10-15: 10 mg via ORAL
  Filled 2022-10-15: qty 1

## 2022-10-15 NOTE — ED Triage Notes (Signed)
Pt arrived via POV. Pt had gastric bypass surgery 5/16/ C/o abd pain, N/V for 1x week.  AOX4

## 2022-10-15 NOTE — ED Provider Notes (Signed)
[place] EMERGENCY DEPARTMENT AT [facility] Provider Note   CSN: [id number] Arrival date & time: 10/15/22  1734     History  Chief Complaint  Patient presents with   Abdominal Pain   Nausea   Emesis    Christine [name] is a 72 y.o. female.  72 year old female with history of obesity status post gastric bypass on 09/14/2022, HTN, HLD, and OSA on CPAP who presents emergency department with abdominal pain and nausea and vomiting.  Patient reports that immediately after her bypass she was doing well on a clear liquid diet.  Introduce solid foods and started to have nausea and vomiting.  Says that over the past week and a half she has been unable to tolerate even liquids due to the nonbloody nonbilious emesis that she is frequently having.  Also has diffuse abdominal pains.  Last bowel movement was 6 days ago.  Unsure if she still passing gas or not.  Has difficulty characterizing the abdominal pain additionally including exacerbating or alleviating factors.  Did call her surgeon who referred her into the emergency department today.  Only other abdominal surgery was an appendectomy.       Home Medications Prior to Admission medications   Medication Sig Start Date End Date Taking? Authorizing Provider  augmented betamethasone dipropionate (DIPROLENE-AF) 0.05 % cream Apply 1 Application topically 2 (two) times daily. 07/12/22  Yes [provider]  DULoxetine (CYMBALTA) 60 MG capsule Take 1 capsule (60 mg total) by mouth daily. 12/25/21  Yes [name], DO  esomeprazole (NEXIUM) 40 MG capsule Take 1 capsule (40 mg total) by mouth daily. 12/25/21  Yes [name], DO  ferrous sulfate 325 (65 FE) MG EC tablet Take 325 mg by mouth daily with breakfast.   Yes [provider]  metoCLOPramide (REGLAN) 10 MG tablet Take 1 tablet (10 mg total) by mouth every 6 (six) hours. 10/15/22  Yes Rondel Baton, MD  metoprolol succinate (TOPROL-XL) 50 MG 24  hr tablet Take 50 mg by mouth daily. Take with or immediately following a meal.   Yes [provider]  oxybutynin (DITROPAN XL) 15 MG 24 hr tablet Take 15 mg by mouth at bedtime.   Yes [provider]  pantoprazole (PROTONIX) 40 MG tablet Take 1 tablet (40 mg total) by mouth daily. 09/15/22  Yes Stechschulte, Hyman Hopes, MD  rosuvastatin (CRESTOR) 10 MG tablet Take 1 tablet (10 mg total) by mouth daily. 12/25/21  Yes [name], DO  triamterene-hydrochlorothiazide (MAXZIDE-25) 37.5-25 MG tablet Take 1 tablet by mouth daily. 09/28/22  Yes [name], DO  enoxaparin (LOVENOX) 60 MG/0.6ML injection Inject 0.6 mLs (60 mg total) into the skin every 12 (twelve) hours for 14 days. 09/15/22 09/29/22  Stechschulte, Hyman Hopes, MD  metoprolol tartrate (LOPRESSOR) 50 MG tablet Take 1 tablet (50 mg total) by mouth 2 (two) times daily. 09/06/22   [name], DO      Allergies    Prednisone, Tape, and Sulfa antibiotics    Review of Systems   Review of Systems  Physical Exam Updated Vital Signs BP 133/85   Pulse 63   Temp 98 F (36.7 C) (Oral)   Resp 16   Ht 5\' 1"  (1.549 m)   Wt 120.2 kg   SpO2 100%   BMI 50.07 kg/m  Physical Exam Vitals and nursing note reviewed.  Constitutional:      General: She is not in acute distress.    Appearance: She  is well-developed.  HENT:     Head: Normocephalic and atraumatic.     Right Ear: External ear normal.     Left Ear: External ear normal.     Nose: Nose normal.  Eyes:     Extraocular Movements: Extraocular movements intact.     Conjunctiva/sclera: Conjunctivae normal.     Pupils: Pupils are equal, round, and reactive to light.  Pulmonary:     Effort: Pulmonary effort is normal. No respiratory distress.  Abdominal:     General: Abdomen is flat. There is no distension.     Palpations: Abdomen is soft. There is no mass.     Tenderness: There is abdominal tenderness (Left mid abdomen). There is no guarding.   Musculoskeletal:     Cervical back: Normal range of motion and neck supple.     Right lower leg: Edema present.     Left lower leg: Edema present.  Skin:    General: Skin is warm and dry.  Neurological:     Mental Status: She is alert and oriented to person, place, and time. Mental status is at baseline.  Psychiatric:        Mood and Affect: Mood normal.     ED Results / Procedures / Treatments   Labs (all labs ordered are listed, but only abnormal results are displayed) Labs Reviewed  COMPREHENSIVE METABOLIC PANEL - Abnormal; Notable for the following components:      Result Value   CO2 19 (*)    Glucose, Bld 120 (*)    All other components within normal limits  CBC - Abnormal; Notable for the following components:   Hemoglobin 10.9 (*)    MCH 25.8 (*)    MCHC 29.5 (*)    RDW 16.6 (*)    All other components within normal limits  LIPASE, BLOOD    EKG None  Radiology CT ABDOMEN PELVIS W CONTRAST  Result Date: 10/15/2022 CLINICAL DATA:  Recent gastric bypass. Abdominal pain, nausea, vomiting. EXAM: CT ABDOMEN AND PELVIS WITH CONTRAST TECHNIQUE: Multidetector CT imaging of the abdomen and pelvis was performed using the standard protocol following bolus administration of intravenous contrast. RADIATION DOSE REDUCTION: This exam was performed according to the departmental dose-optimization program which includes automated exposure control, adjustment of the mA and/or kV according to patient size and/or use of iterative reconstruction technique. CONTRAST:  OMNIPAQUE IOHEXOL 300 MG/ML  SOLN COMPARISON:  09/25/2020. FINDINGS: Lower chest: Mild atelectasis at the lung bases. Hepatobiliary: Fatty infiltration of the liver is noted. There is focal fatty infiltration in the left lobe of the liver adjacent to the falciform ligament. No biliary ductal dilatation. The gallbladder is without stones. Pancreas: Unremarkable. No pancreatic ductal dilatation or surrounding inflammatory  changes. Spleen: Normal in size without focal abnormality. Adrenals/Urinary Tract: The adrenal glands are within normal limits. The kidneys enhance symmetrically. A stable minimally complex cyst is noted in the lower pole of the left kidney. Cyst is noted in the mid left kidney. Additional subcentimeter hypodensities are present bilaterally which are too small to further characterize. No renal calculus or hydronephrosis. The bladder is unremarkable. Stomach/Bowel: Gastric surgery changes are noted. There is a small hiatal hernia. No free air or contrast extravasation is seen. No bowel obstruction, free air, or pneumatosis. A few scattered diverticula are present along the colon without evidence of diverticulitis. No focal bowel wall thickening or inflammatory changes. No focal fluid collection to suggest abscess. Vascular/Lymphatic: Aortic atherosclerosis. No enlarged abdominal or pelvic lymph nodes. Reproductive:  Status post hysterectomy. No adnexal masses. Other: No abdominopelvic ascites. Musculoskeletal: There sclerosis at the sacroiliac joints bilaterally, compatible with sacroiliitis. Degenerative changes are present in the thoracolumbar spine. No acute osseous abnormality. IMPRESSION: 1. No bowel obstruction or free air. 2. Gastric surgery changes and small hiatal hernia. No acute inflammatory changes, abscess, or contrast extravasation. 3. Hepatic steatosis. 4. Aortic atherosclerosis. Electronically Signed   By: Thornell Sartorius M.D.   On: 10/15/2022 21:31    Procedures Procedures    Medications Ordered in ED Medications  ondansetron [facility]) injection 4 mg (4 mg Intravenous Given 10/15/22 2027)  lactated ringers bolus 500 mL (0 mLs Intravenous Stopped 10/15/22 2315)  iohexol (OMNIPAQUE) 300 MG/ML solution 100 mL (100 mLs Intravenous Contrast Given 10/15/22 2112)  metoCLOPramide (REGLAN) tablet 10 mg (10 mg Oral Given 10/15/22 2315)    ED Course/ Medical Decision Making/ A&P Clinical Course as of  10/16/22 1103  Fri Oct 15, 2022  2004 Hemoglobin(!): 10.9 Baseline of 9 [RP]    Clinical Course User Index [RP] Rondel Baton, MD                             Medical Decision Making Amount and/or Complexity of Data Reviewed Labs: ordered. Decision-making details documented in ED Course. Radiology: ordered.  Risk Prescription drug management.   Christine [name] is a 72 y.o. female with comorbidities that complicate the patient evaluation including obesity status post gastric bypass on 09/14/2022, HTN, HLD, and OSA on CPAP who presents emergency department with abdominal pain and nausea and vomiting.   Initial Ddx:  Anastomotic leak, internal hernia, small bowel obstruction  MDM/Course:  Initially was concerned about possible internal hernia or perhaps partial small bowel obstruction given the patient's symptoms.  She had a CT scan obtained with oral contrast rule out anastomotic leak which was unremarkable.  Upon reevaluation, patient's pain was controlled with several rounds of IV pain medication and nausea medication.  Given her reassuring images and the fact that she is able to tolerate p.o. will have her follow-up with her surgeon.  Patient was given a prescription of Reglan since she says that the Zofran has not always been helping her nausea.  Also instructed her to start on a liquid diet and advance as tolerated since she thinks that solid food may be worsening her nausea and vomiting.  This patient presents to the ED for concern of complaints listed in HPI, this involves an extensive number of treatment options, and is a complaint that carries with it a high risk of complications and morbidity. Disposition including potential need for admission considered.   Dispo: DC Home. Return precautions discussed including, but not limited to, those listed in the AVS. Allowed pt time to ask questions which were answered fully prior to dc.  Records reviewed Outpatient Clinic Notes The  following labs were independently interpreted: Chemistry and show chronic anemia I independently reviewed the following imaging with scope of interpretation limited to determining acute life threatening conditions related to emergency care: CT Abdomen/Pelvis and agree with the radiologist interpretation with the following exceptions: none I personally reviewed and interpreted cardiac monitoring: normal sinus rhythm  I personally reviewed and interpreted the pt's EKG: see above for interpretation  I have reviewed the patients home medications and made adjustments as needed Social Determinants of health:  Elderly         Final Clinical Impression(s) / ED Diagnoses Final diagnoses:  Generalized abdominal pain  Nausea and vomiting, unspecified vomiting type  History of gastric bypass    Rx / DC Orders ED Discharge Orders          Ordered    metoCLOPramide (REGLAN) 10 MG tablet  Every 6 hours        10/15/22 2259              Rondel Baton, MD 10/16/22 1103

## 2022-10-15 NOTE — Discharge Instructions (Addendum)
You were seen for your abdominal pain and nausea and vomiting in the emergency department.  You had a CT scan that was reassuring.  At home, please resume the liquid diet for the next 1 to 2 days.  Take the Zofran you have been given and/or the Reglan that we have prescribed you for any nausea or vomiting.    Check your MyChart online for the results of any tests that had not resulted by the time you left the emergency department.   Follow-up with your primary doctor in 2-3 days regarding your visit.  Call your surgeons tomorrow to give them an update and see if they would like to see you in clinic.  Return immediately to the emergency department if you experience any of the following: Severe abdominal pain, vomiting despite the medications, or any other concerning symptoms.    Thank you for visiting our Emergency Department. It was a pleasure taking care of you today.

## 2022-10-21 DIAGNOSIS — F330 Major depressive disorder, recurrent, mild: Secondary | ICD-10-CM | POA: Diagnosis not present

## 2022-10-22 ENCOUNTER — Ambulatory Visit: Payer: Medicare HMO | Admitting: Family Medicine

## 2022-10-22 ENCOUNTER — Telehealth: Payer: Self-pay

## 2022-10-22 DIAGNOSIS — G4733 Obstructive sleep apnea (adult) (pediatric): Secondary | ICD-10-CM | POA: Diagnosis not present

## 2022-10-22 NOTE — Telephone Encounter (Signed)
Transition Care Management Follow-up Telephone Call Date of discharge and from where: Christine [name] 6/22 How have you been since you were released from the hospital? Doing good Any questions or concerns? No  Items Reviewed: Did the pt receive and understand the discharge instructions provided? Yes  Medications obtained and verified? Yes  Other? No  Any new allergies since your discharge? No  Dietary orders reviewed? Yes Do you have support at home? No     Follow up appointments reviewed: PCP Hospital f/u appt confirmed? No  Scheduled to see  on  @ . Specialist Hospital f/u appt confirmed? Yes  Scheduled to see  on  @ . Are transportation arrangements needed? Yes  If their condition worsens, is the pt aware to call PCP or go to the Emergency Dept.? Yes Was the patient provided with contact information for the PCP's office or ED? Yes Was to pt encouraged to call back with questions or concerns? Yes

## 2022-10-27 ENCOUNTER — Ambulatory Visit (INDEPENDENT_AMBULATORY_CARE_PROVIDER_SITE_OTHER): Payer: Medicare HMO | Admitting: Family Medicine

## 2022-10-27 ENCOUNTER — Ambulatory Visit: Payer: Medicare HMO | Admitting: Podiatry

## 2022-10-27 ENCOUNTER — Encounter: Payer: Self-pay | Admitting: Family Medicine

## 2022-10-27 VITALS — BP 122/82 | HR 82 | Temp 98.0°F | Ht 61.0 in | Wt 262.5 lb

## 2022-10-27 DIAGNOSIS — R110 Nausea: Secondary | ICD-10-CM | POA: Diagnosis not present

## 2022-10-27 DIAGNOSIS — M79674 Pain in right toe(s): Secondary | ICD-10-CM | POA: Diagnosis not present

## 2022-10-27 DIAGNOSIS — M79675 Pain in left toe(s): Secondary | ICD-10-CM | POA: Diagnosis not present

## 2022-10-27 DIAGNOSIS — B351 Tinea unguium: Secondary | ICD-10-CM

## 2022-10-27 MED ORDER — PROMETHAZINE HCL 25 MG PO TABS: 25.0000 mg | ORAL_TABLET | Freq: Four times a day (QID) | ORAL | 0 refills | Status: DC | PRN

## 2022-10-27 NOTE — Patient Instructions (Signed)
The new nausea medication can make you drowsy, so plan accordingly.   Let us know if you need anything.

## 2022-10-27 NOTE — Progress Notes (Signed)
   SUBJECTIVE Patient presents to office today complaining of elongated, thickened nails that cause pain while ambulating in shoes.  Patient is unable to trim their own nails. Patient is here for further evaluation and treatment.  Past Medical History:  Diagnosis Date   Allergy    dust, pollen, sulfa, prednisone   Anemia    Anxiety    Arthritis    "knees" (04/26/2016)   Colon polyps    benign per pt   Coronary artery disease    mild per 2015 cath in [place] (OM1 30%, RCA 30%)   GERD (gastroesophageal reflux disease)    Gout    History of hiatal hernia    Hyperlipidemia 05/20/2021   Hypertension    Migraine    "none since early /2017" (05/06/2016)   Obesity    OSA on CPAP    uses CPAP   Pre-diabetes    Pre-operative cardiovascular examination 08/22/2008   Vasculitis (HCC)    Bilateral    OBJECTIVE General Patient is awake, alert, and oriented x 3 and in no acute distress. Derm Skin is dry and supple bilateral. Negative open lesions or macerations. Remaining integument unremarkable. Nails are tender, long, thickened and dystrophic with subungual debris, consistent with onychomycosis, 1-5 bilateral. No signs of infection noted. Vasc  DP and PT pedal pulses palpable bilaterally. Temperature gradient within normal limits.  Neuro Epicritic and protective threshold sensation grossly intact bilaterally.  Musculoskeletal Exam No symptomatic pedal deformities noted bilateral. Muscular strength within normal limits.  ASSESSMENT 1.  Pain due to onychomycosis of toenails both  PLAN OF CARE 1. Patient evaluated today.  2. Instructed to maintain good pedal hygiene and foot care.  3. Mechanical debridement of nails 1-5 bilaterally performed using a nail nipper. Filed with dremel without incident.  4. Return to clinic in 3 mos.    Felecia Shelling, DPM [facility]  Dr. Felecia Shelling, DPM    [address] [place], [place] [address]                 Office [phone]  Fax [phone]

## 2022-10-27 NOTE — Progress Notes (Signed)
Chief Complaint  Patient presents with   Follow-up    ER    Subjective: Patient is a 72 y.o. female here for f/u.  On 09/14/2022, the patient had gastric bypass surgery.  Since that time, she has had issues returning to a normal diet.  She has had lots of pain, nausea, and retching.  She went to the emergency department on 10/15/2022 for the same issue.  She was given Reglan with near resolution of her symptoms.  Her surgery team did not want her on this and change her to Zofran which is providing some minor benefit.  A CMA changed her diet back to CLD and she is now doing much better nausea still slightly bothersome.   Past Medical History:  Diagnosis Date   Allergy    dust, pollen, sulfa, prednisone   Anemia    Anxiety    Arthritis    "knees" (04/26/2016)   Colon polyps    benign per pt   Coronary artery disease    mild per 2015 cath in [place] (OM1 30%, RCA 30%)   GERD (gastroesophageal reflux disease)    Gout    History of hiatal hernia    Hyperlipidemia 05/20/2021   Hypertension    Migraine    "none since early /2017" (05/06/2016)   Obesity    OSA on CPAP    uses CPAP   Pre-diabetes    Pre-operative cardiovascular examination 08/22/2008   Vasculitis (HCC)    Bilateral    Objective: BP 122/82 (BP Location: Left Arm, Patient Position: Sitting, Cuff Size: Large)   Pulse 82   Temp 98 F (36.7 C) (Oral)   Ht 5\' 1"  (1.549 m)   Wt 262 lb 8 oz (119.1 kg)   SpO2 99%   BMI 49.60 kg/m  General: Awake, appears stated age Lungs: No accessory muscle use Psych: Age appropriate judgment and insight, normal affect and mood  Assessment and Plan: Nausea  Try phenergan, warned about possible drowsiness. OK to return to Zofran if non-effective. Glad she is improving from post-op complications.  The patient voiced understanding and agreement to the plan.  Jilda Roche [place], DO 10/27/22  12:22 PM

## 2022-10-29 ENCOUNTER — Encounter: Payer: Self-pay | Admitting: Family Medicine

## 2022-11-03 ENCOUNTER — Telehealth: Payer: Self-pay | Admitting: *Deleted

## 2022-11-03 NOTE — Telephone Encounter (Signed)
Benefits verified for Monovisc/orthovisc.

## 2022-11-03 NOTE — Telephone Encounter (Signed)
-----   Message from Carl Best sent at 10/25/2022  3:46 PM EDT ----- Regarding: Patient cld states bilateral knee pain & wants injection Patient called states her knees are hurting and ask if she can have the some type of Injections,. See your 2/24 message re: what her Ins Co approves :  Merrilyn P[place], [place]   SS   06/21/22  9:05 AM Note   Patient 's Ins denied Monovisc request---They prefer Durolane, Uflexa ,Synvisc or Synvisc 1.  Says we can either call [phone] or fax response to them fax@ [phone]   Ref or case# [id number].  Benefits verified for Right knee Durolane. --( Pt got Monovisc 07/08/22 says it did Not last, she has since had her Gastric Bypass Surg and says knees has returned.  ---Please VOB & check PAC requirement (advised pt of this process and that it maybe mid July before response rcvd due to multiple holiday closings.  Thx

## 2022-11-04 NOTE — Telephone Encounter (Signed)
Rec'd SOB for Monovisc. Plan covers 80%, patient will owe remaining 20%. Patient has a $20 copay. Deductible does not apply. Once OOP is met, coverage goes to 100%. OOP max is $4500 $1095.52 has been met. PA is on file valid from 06/24/22 to 12/20/22.

## 2022-11-05 ENCOUNTER — Telehealth: Payer: Self-pay | Admitting: Skilled Nursing Facility1

## 2022-11-05 NOTE — Telephone Encounter (Signed)
Called to check in on pt.   Pt states her nausea is better since starting a medication but still having.  Pt state she does not know if she is drinking enough because she spends a lot of the day sleeping. Dietitian advised she try to be up throughout the day to reduce risks. Dietitian asked if PT could be an option and pt states she definitely could not do that, it would ware her out. Pt states since surgery her physical strength has not come back stating the first half of the day she is okay until 2pm.   Pt admits she did not tell Puja any of this.   Pt states the diarrhea has stopped.   Dietitian set pt up for July 15th appt due to higher risk and needing closer follow up.

## 2022-11-05 NOTE — Telephone Encounter (Signed)
I called pt- She had gastric bypass on 09/14/22. She states her knee pain has pretty much resolved since surgery. She wants to hold off on Monovisc for now. She does c/o acute right knee pain from possible sprain or twist. She wants an appt in [place] [facility]. Transferred to Dondra Spry H to schedule.

## 2022-11-06 ENCOUNTER — Encounter: Payer: Self-pay | Admitting: Family Medicine

## 2022-11-08 ENCOUNTER — Emergency Department (HOSPITAL_COMMUNITY): Payer: Medicare HMO

## 2022-11-08 ENCOUNTER — Encounter (HOSPITAL_COMMUNITY): Payer: Self-pay

## 2022-11-08 ENCOUNTER — Inpatient Hospital Stay (HOSPITAL_COMMUNITY)
Admission: EM | Admit: 2022-11-08 | Discharge: 2022-11-15 | DRG: 640 | Disposition: A | Discharge status: Skilled Nursing Facility | Payer: Medicare HMO | Attending: Internal Medicine | Admitting: Internal Medicine

## 2022-11-08 ENCOUNTER — Ambulatory Visit: Payer: Medicare HMO | Admitting: Skilled Nursing Facility1

## 2022-11-08 ENCOUNTER — Other Ambulatory Visit: Payer: Self-pay | Admitting: Family Medicine

## 2022-11-08 ENCOUNTER — Other Ambulatory Visit: Payer: Self-pay

## 2022-11-08 DIAGNOSIS — Z8249 Family history of ischemic heart disease and other diseases of the circulatory system: Secondary | ICD-10-CM

## 2022-11-08 DIAGNOSIS — A419 Sepsis, unspecified organism: Secondary | ICD-10-CM | POA: Diagnosis not present

## 2022-11-08 DIAGNOSIS — Z79899 Other long term (current) drug therapy: Secondary | ICD-10-CM

## 2022-11-08 DIAGNOSIS — N179 Acute kidney failure, unspecified: Secondary | ICD-10-CM | POA: Diagnosis present

## 2022-11-08 DIAGNOSIS — Z9884 Bariatric surgery status: Secondary | ICD-10-CM

## 2022-11-08 DIAGNOSIS — R6510 Systemic inflammatory response syndrome (SIRS) of non-infectious origin without acute organ dysfunction: Secondary | ICD-10-CM | POA: Diagnosis present

## 2022-11-08 DIAGNOSIS — R17 Unspecified jaundice: Secondary | ICD-10-CM

## 2022-11-08 DIAGNOSIS — Z8261 Family history of arthritis: Secondary | ICD-10-CM

## 2022-11-08 DIAGNOSIS — E860 Dehydration: Principal | ICD-10-CM | POA: Diagnosis present

## 2022-11-08 DIAGNOSIS — N289 Disorder of kidney and ureter, unspecified: Secondary | ICD-10-CM

## 2022-11-08 DIAGNOSIS — Z1152 Encounter for screening for COVID-19: Secondary | ICD-10-CM

## 2022-11-08 DIAGNOSIS — Z813 Family history of other psychoactive substance abuse and dependence: Secondary | ICD-10-CM

## 2022-11-08 DIAGNOSIS — N281 Cyst of kidney, acquired: Secondary | ICD-10-CM | POA: Diagnosis not present

## 2022-11-08 DIAGNOSIS — R410 Disorientation, unspecified: Secondary | ICD-10-CM | POA: Diagnosis not present

## 2022-11-08 DIAGNOSIS — E876 Hypokalemia: Secondary | ICD-10-CM | POA: Diagnosis present

## 2022-11-08 DIAGNOSIS — K5900 Constipation, unspecified: Secondary | ICD-10-CM | POA: Diagnosis present

## 2022-11-08 DIAGNOSIS — D649 Anemia, unspecified: Secondary | ICD-10-CM | POA: Diagnosis present

## 2022-11-08 DIAGNOSIS — Z888 Allergy status to other drugs, medicaments and biological substances status: Secondary | ICD-10-CM

## 2022-11-08 DIAGNOSIS — Z0389 Encounter for observation for other suspected diseases and conditions ruled out: Secondary | ICD-10-CM | POA: Diagnosis not present

## 2022-11-08 DIAGNOSIS — M1A9XX0 Chronic gout, unspecified, without tophus (tophi): Secondary | ICD-10-CM | POA: Diagnosis present

## 2022-11-08 DIAGNOSIS — L03115 Cellulitis of right lower limb: Secondary | ICD-10-CM

## 2022-11-08 DIAGNOSIS — E872 Acidosis, unspecified: Secondary | ICD-10-CM | POA: Diagnosis present

## 2022-11-08 DIAGNOSIS — R7303 Prediabetes: Secondary | ICD-10-CM | POA: Diagnosis present

## 2022-11-08 DIAGNOSIS — I959 Hypotension, unspecified: Secondary | ICD-10-CM | POA: Diagnosis not present

## 2022-11-08 DIAGNOSIS — G4733 Obstructive sleep apnea (adult) (pediatric): Secondary | ICD-10-CM | POA: Diagnosis present

## 2022-11-08 DIAGNOSIS — I2510 Atherosclerotic heart disease of native coronary artery without angina pectoris: Secondary | ICD-10-CM | POA: Diagnosis present

## 2022-11-08 DIAGNOSIS — M170 Bilateral primary osteoarthritis of knee: Secondary | ICD-10-CM | POA: Diagnosis present

## 2022-11-08 DIAGNOSIS — Z811 Family history of alcohol abuse and dependence: Secondary | ICD-10-CM

## 2022-11-08 DIAGNOSIS — Z833 Family history of diabetes mellitus: Secondary | ICD-10-CM

## 2022-11-08 DIAGNOSIS — Z87891 Personal history of nicotine dependence: Secondary | ICD-10-CM

## 2022-11-08 DIAGNOSIS — E785 Hyperlipidemia, unspecified: Secondary | ICD-10-CM | POA: Diagnosis present

## 2022-11-08 DIAGNOSIS — Z6841 Body Mass Index (BMI) 40.0 and over, adult: Secondary | ICD-10-CM

## 2022-11-08 DIAGNOSIS — Z825 Family history of asthma and other chronic lower respiratory diseases: Secondary | ICD-10-CM

## 2022-11-08 DIAGNOSIS — R109 Unspecified abdominal pain: Secondary | ICD-10-CM | POA: Diagnosis not present

## 2022-11-08 DIAGNOSIS — M109 Gout, unspecified: Secondary | ICD-10-CM | POA: Diagnosis present

## 2022-11-08 DIAGNOSIS — Z882 Allergy status to sulfonamides status: Secondary | ICD-10-CM

## 2022-11-08 DIAGNOSIS — R1084 Generalized abdominal pain: Secondary | ICD-10-CM | POA: Diagnosis not present

## 2022-11-08 DIAGNOSIS — I700 Atherosclerosis of aorta: Secondary | ICD-10-CM | POA: Diagnosis not present

## 2022-11-08 DIAGNOSIS — R7401 Elevation of levels of liver transaminase levels: Secondary | ICD-10-CM

## 2022-11-08 DIAGNOSIS — R0689 Other abnormalities of breathing: Secondary | ICD-10-CM | POA: Diagnosis not present

## 2022-11-08 DIAGNOSIS — D638 Anemia in other chronic diseases classified elsewhere: Secondary | ICD-10-CM | POA: Diagnosis present

## 2022-11-08 DIAGNOSIS — G9341 Metabolic encephalopathy: Secondary | ICD-10-CM | POA: Diagnosis present

## 2022-11-08 DIAGNOSIS — R7989 Other specified abnormal findings of blood chemistry: Secondary | ICD-10-CM

## 2022-11-08 DIAGNOSIS — K219 Gastro-esophageal reflux disease without esophagitis: Secondary | ICD-10-CM | POA: Diagnosis present

## 2022-11-08 DIAGNOSIS — E538 Deficiency of other specified B group vitamins: Secondary | ICD-10-CM | POA: Diagnosis present

## 2022-11-08 DIAGNOSIS — I10 Essential (primary) hypertension: Secondary | ICD-10-CM | POA: Diagnosis present

## 2022-11-08 MED ORDER — SODIUM CHLORIDE 0.9 % IV SOLN
2.0000 g | Freq: Once | INTRAVENOUS | Status: AC
  Administered 2022-11-09: 2 g via INTRAVENOUS
  Filled 2022-11-08: qty 12.5

## 2022-11-08 MED ORDER — LACTATED RINGERS IV BOLUS (SEPSIS)
1000.0000 mL | Freq: Once | INTRAVENOUS | Status: DC
  Administered 2022-11-09: 1000 mL via INTRAVENOUS

## 2022-11-08 MED ORDER — LACTATED RINGERS IV BOLUS (SEPSIS)
1000.0000 mL | Freq: Once | INTRAVENOUS | Status: AC
  Administered 2022-11-09: 1000 mL via INTRAVENOUS

## 2022-11-08 MED ORDER — ACETAMINOPHEN 500 MG PO TABS
1000.0000 mg | ORAL_TABLET | Freq: Once | ORAL | Status: AC
  Administered 2022-11-09: 1000 mg via ORAL

## 2022-11-08 MED ORDER — METRONIDAZOLE 500 MG/100ML IV SOLN
500.0000 mg | Freq: Once | INTRAVENOUS | Status: AC
  Administered 2022-11-09: 500 mg via INTRAVENOUS
  Filled 2022-11-08: qty 100

## 2022-11-08 MED ORDER — VANCOMYCIN HCL 2000 MG/400ML IV SOLN
2000.0000 mg | Freq: Once | INTRAVENOUS | Status: AC
  Administered 2022-11-09: 2000 mg via INTRAVENOUS
  Filled 2022-11-08: qty 400

## 2022-11-08 MED ORDER — VANCOMYCIN HCL IN DEXTROSE 1-5 GM/200ML-% IV SOLN: 1000.0000 mg | Freq: Once | INTRAVENOUS | Status: DC

## 2022-11-08 MED ORDER — LACTATED RINGERS IV SOLN: INTRAVENOUS | Status: DC

## 2022-11-08 MED ORDER — COLCHICINE 0.6 MG PO TABS
ORAL_TABLET | ORAL | 1 refills | Status: DC
Start: 2022-11-08 — End: 2022-11-15

## 2022-11-08 NOTE — Progress Notes (Signed)
ED Pharmacy Antibiotic Sign Off An antibiotic consult was received from an ED provider for Vancomycin/Cefepime per pharmacy dosing for sepsis. A chart review was completed to assess appropriateness.   The following one time order(s) were placed:  Vancomycin 2000 mg IV x 1 Cefepime 2g IV x 1   Further antibiotic and/or antibiotic pharmacy consults should be ordered by the admitting provider if indicated.   Thank you for allowing pharmacy to be a part of this patient's care.   Abran Duke, PharmD, BCPS Clinical Pharmacist Phone: [phone]

## 2022-11-08 NOTE — ED Provider Notes (Signed)
[facility]-EM[facility]ENCY DEPT [facility] Emergency Department Provider Note MRN:  [id number]  Arrival date & time: 11/09/22     Chief Complaint   Constipation (Patient to ED via EMS with complaint of no BM x 3 days. Patient arrives to ED with soiled dress from [facility] and urine)   History of Present Illness   Christine [name] is a 72 y.o. year-old female presents to the ED with chief complaint of bilateral foot pain.  She is alert to person and place, but not time.  She is quite confused and is unable to provide an accurate history.  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  Recent eval about a month ago for abdominal pain.  Had a reassuring workup and was sent home.  History provided by patient.   Review of Systems  Pertinent positive and negative review of systems noted in HPI.    Physical Exam   Vitals:   11/09/22 0251 11/09/22 0307  BP:    Pulse: 76   Resp: (!) 23 (!) 22  Temp: (!) 97.3 F (36.3 C)   SpO2: 97%     CONSTITUTIONAL:  dry-appearing, NAD NEURO:  Alert and oriented x 3, CN 3-12 grossly intact EYES:  eyes equal and reactive ENT/NECK:  Supple, no stridor  CARDIO:  normal rate, regular rhythm, appears well-perfused  PULM:  No respiratory distress, CTAB GI/GU:  non-distended,  MSK/SPINE:  No gross deformities, no edema, moves all extremities  SKIN:  no rash, atraumatic   *Additional and/or pertinent findings included in MDM below  Diagnostic and Interventional Summary    EKG Interpretation Date/Time:  Monday November 08 2022 23:28:52 EDT Ventricular Rate:  76 PR Interval:  157 QRS Duration:  98 QT Interval:  389 QTC Calculation: 438 R Axis:   54  Text Interpretation: Sinus rhythm Confirmed by Nicanor Alcon, April ([id number]) on 11/08/2022 11:44:09 PM       Labs Reviewed  COMPREHENSIVE METABOLIC PANEL - Abnormal; Notable for the following components:      Result Value   Potassium 2.9 (*)    CO2 15 (*)    Glucose, Bld 118 (*)    BUN 24 (*)    Creatinine, Ser 1.06  (*)    Albumin 3.1 (*)    AST 58 (*)    Total Bilirubin 2.0 (*)    GFR, Estimated 57 (*)    Anion gap 17 (*)    All other components within normal limits  CBC WITH DIFFERENTIAL/PLATELET - Abnormal; Notable for the following components:   Hemoglobin 11.5 (*)    RDW 17.4 (*)    All other components within normal limits  PROTIME-INR - Abnormal; Notable for the following components:   Prothrombin Time 17.0 (*)    INR 1.4 (*)    All other components within normal limits  URINALYSIS, W/ REFLEX TO CULTURE (INFECTION SUSPECTED) - Abnormal; Notable for the following components:   Color, Urine AMBER (*)    Specific Gravity, Urine 1.043 (*)    Ketones, ur 20 (*)    Protein, ur 100 (*)    All other components within normal limits  I-STAT CG4 LACTIC ACID, ED - Abnormal; Notable for the following components:   Lactic Acid, Venous 3.2 (*)    All other components within normal limits  CULTURE, BLOOD (ROUTINE X 2)  CULTURE, BLOOD (ROUTINE X 2)  LIPASE, BLOOD  APTT  I-STAT CG4 LACTIC ACID, ED    CT ABDOMEN PELVIS W CONTRAST  Final Result    DG  Chest Port 1 View  Final Result      Medications  lactated ringers infusion (has no administration in time range)  lactated ringers bolus 1,000 mL (0 mLs Intravenous Stopped 11/09/22 0035)    And  lactated ringers bolus 1,000 mL (0 mLs Intravenous Stopped 11/09/22 0303)    And  lactated ringers bolus 1,000 mL (1,000 mLs Intravenous New Bag/Given 11/09/22 0230)    And  lactated ringers bolus 1,000 mL (1,000 mLs Intravenous New Bag/Given 11/09/22 0303)  ceFEPIme (MAXIPIME) 2 g in sodium chloride 0.9 % 100 mL IVPB (has no administration in time range)  metroNIDAZOLE (FLAGYL) IVPB 500 mg (has no administration in time range)  potassium chloride 10 mEq in 100 mL IVPB (10 mEq Intravenous New Bag/Given 11/09/22 0252)  vancomycin (VANCOREADY) IVPB 2000 mg/400 mL (2,000 mg Intravenous New Bag/Given 11/09/22 0024)  acetaminophen (TYLENOL) tablet 1,000 mg (1,000  mg Oral Given 11/09/22 0101)  iohexol (OMNIPAQUE) 350 MG/ML injection 100 mL (100 mLs Intravenous Contrast Given 11/09/22 0125)     Procedures  /  Critical Care .Critical Care  Performed by: Roxy Horseman, PA-C Authorized by: Roxy Horseman, PA-C   Critical care provider statement:    Critical care time (minutes):  37   Critical care was necessary to treat or prevent imminent or life-threatening deterioration of the following conditions:  Sepsis   Critical care was time spent personally by me on the following activities:  Development of treatment plan with patient or surrogate, discussions with consultants, evaluation of patient's response to treatment, examination of patient, ordering and review of laboratory studies, ordering and review of radiographic studies, ordering and performing treatments and interventions, pulse oximetry, re-evaluation of patient's condition and review of old charts   ED Course and Medical Decision Making  I have reviewed the triage vital signs, the nursing notes, and pertinent available records from the EMR.  Social Determinants Affecting Complexity of Care: Patient has no clinically significant social determinants affecting this chief complaint..   ED Course:    Medical Decision Making Patient here with fever, slight confusion.  Doesn't know the season or time, but this improved after fluids.   Appears very dry on exam.  Has some bilateral LE skin changes of suspected vascular insufficiency, but also has slightly increased warmth and faint redness to RLE.  She states that pain in this leg is the reason for her visit.    Amount and/or Complexity of Data Reviewed Labs: ordered.    Details: Lact 3.2->1.7 after fluids AST 58, t bili 2.0, but no focal abdominal tenderness Hypokalemia to 2.9, repleting with IV K. Radiology: ordered and independent interpretation performed.    Details: No large volume free air or fluid on CT ECG/medicine tests:  ordered.  Risk OTC drugs. Prescription drug management. Decision regarding hospitalization.         Consultants: I consulted with Hospitalist, Dr. Antionette Char, who is appreciated for admitting.   Treatment and Plan: Patient's exam and diagnostic results are concerning for fever and elevated lactic with possible developing cellulitis.  Feel that patient will need admission to the hospital for further treatment and evaluation.    Final Clinical Impressions(s) / ED Diagnoses     ICD-10-CM   1. Sepsis, due to unspecified organism, unspecified whether acute organ dysfunction present (HCC)  A41.9     2. Elevated lactic acid level  R79.89     3. Total bilirubin, elevated  R17     4. Elevated AST (SGOT)  R74.01  5. Hypokalemia  E87.6     6. Cellulitis of right lower extremity  L03.115       ED Discharge Orders     None         Discharge Instructions Discussed with and Provided to Patient:   Discharge Instructions   None      Roxy Horseman, PA-C 11/09/22 Lucinda Dell, April, MD 11/09/22 [phone]

## 2022-11-08 NOTE — Sepsis Progress Note (Signed)
Elink following for septic protocol

## 2022-11-09 ENCOUNTER — Encounter (HOSPITAL_COMMUNITY): Payer: Self-pay | Admitting: Internal Medicine

## 2022-11-09 ENCOUNTER — Emergency Department (HOSPITAL_COMMUNITY): Payer: Medicare HMO

## 2022-11-09 DIAGNOSIS — Z9884 Bariatric surgery status: Secondary | ICD-10-CM | POA: Diagnosis not present

## 2022-11-09 DIAGNOSIS — M7989 Other specified soft tissue disorders: Secondary | ICD-10-CM | POA: Diagnosis not present

## 2022-11-09 DIAGNOSIS — I10 Essential (primary) hypertension: Secondary | ICD-10-CM | POA: Diagnosis not present

## 2022-11-09 DIAGNOSIS — G9341 Metabolic encephalopathy: Secondary | ICD-10-CM | POA: Diagnosis present

## 2022-11-09 DIAGNOSIS — N281 Cyst of kidney, acquired: Secondary | ICD-10-CM | POA: Diagnosis not present

## 2022-11-09 DIAGNOSIS — Z980 Intestinal bypass and anastomosis status: Secondary | ICD-10-CM

## 2022-11-09 DIAGNOSIS — M1A9XX0 Chronic gout, unspecified, without tophus (tophi): Secondary | ICD-10-CM | POA: Diagnosis not present

## 2022-11-09 DIAGNOSIS — M109 Gout, unspecified: Secondary | ICD-10-CM

## 2022-11-09 DIAGNOSIS — Z6841 Body Mass Index (BMI) 40.0 and over, adult: Secondary | ICD-10-CM | POA: Diagnosis not present

## 2022-11-09 DIAGNOSIS — E872 Acidosis, unspecified: Secondary | ICD-10-CM | POA: Diagnosis not present

## 2022-11-09 DIAGNOSIS — Z882 Allergy status to sulfonamides status: Secondary | ICD-10-CM | POA: Diagnosis not present

## 2022-11-09 DIAGNOSIS — Z1152 Encounter for screening for COVID-19: Secondary | ICD-10-CM | POA: Diagnosis not present

## 2022-11-09 DIAGNOSIS — R6510 Systemic inflammatory response syndrome (SIRS) of non-infectious origin without acute organ dysfunction: Secondary | ICD-10-CM | POA: Diagnosis not present

## 2022-11-09 DIAGNOSIS — N289 Disorder of kidney and ureter, unspecified: Secondary | ICD-10-CM | POA: Diagnosis not present

## 2022-11-09 DIAGNOSIS — N179 Acute kidney failure, unspecified: Secondary | ICD-10-CM | POA: Diagnosis not present

## 2022-11-09 DIAGNOSIS — E538 Deficiency of other specified B group vitamins: Secondary | ICD-10-CM | POA: Diagnosis not present

## 2022-11-09 DIAGNOSIS — E860 Dehydration: Secondary | ICD-10-CM | POA: Diagnosis not present

## 2022-11-09 DIAGNOSIS — D649 Anemia, unspecified: Secondary | ICD-10-CM | POA: Diagnosis not present

## 2022-11-09 DIAGNOSIS — M170 Bilateral primary osteoarthritis of knee: Secondary | ICD-10-CM | POA: Diagnosis not present

## 2022-11-09 DIAGNOSIS — G4733 Obstructive sleep apnea (adult) (pediatric): Secondary | ICD-10-CM | POA: Diagnosis not present

## 2022-11-09 DIAGNOSIS — R7303 Prediabetes: Secondary | ICD-10-CM | POA: Diagnosis not present

## 2022-11-09 DIAGNOSIS — K219 Gastro-esophageal reflux disease without esophagitis: Secondary | ICD-10-CM | POA: Diagnosis not present

## 2022-11-09 DIAGNOSIS — K5900 Constipation, unspecified: Secondary | ICD-10-CM | POA: Diagnosis not present

## 2022-11-09 DIAGNOSIS — R109 Unspecified abdominal pain: Secondary | ICD-10-CM | POA: Diagnosis not present

## 2022-11-09 DIAGNOSIS — E785 Hyperlipidemia, unspecified: Secondary | ICD-10-CM

## 2022-11-09 DIAGNOSIS — Z903 Acquired absence of stomach [part of]: Secondary | ICD-10-CM

## 2022-11-09 DIAGNOSIS — A419 Sepsis, unspecified organism: Secondary | ICD-10-CM | POA: Diagnosis not present

## 2022-11-09 DIAGNOSIS — I2510 Atherosclerotic heart disease of native coronary artery without angina pectoris: Secondary | ICD-10-CM | POA: Diagnosis not present

## 2022-11-09 DIAGNOSIS — E876 Hypokalemia: Secondary | ICD-10-CM | POA: Diagnosis not present

## 2022-11-09 DIAGNOSIS — Z87891 Personal history of nicotine dependence: Secondary | ICD-10-CM | POA: Diagnosis not present

## 2022-11-09 DIAGNOSIS — D638 Anemia in other chronic diseases classified elsewhere: Secondary | ICD-10-CM | POA: Diagnosis not present

## 2022-11-09 HISTORY — DX: Disorder of kidney and ureter, unspecified: N28.9

## 2022-11-09 LAB — CBC
HCT: 31.1 % — ABNORMAL LOW (ref 36.0–46.0)
Hemoglobin: 9.6 g/dL — ABNORMAL LOW (ref 12.0–15.0)
MCH: 26.2 pg (ref 26.0–34.0)
MCHC: 30.9 g/dL (ref 30.0–36.0)
MCV: 84.7 fL (ref 80.0–100.0)
Platelets: 223 10*3/uL (ref 150–400)
RBC: 3.67 MIL/uL — ABNORMAL LOW (ref 3.87–5.11)
RDW: 17.5 % — ABNORMAL HIGH (ref 11.5–15.5)
WBC: 9.7 10*3/uL (ref 4.0–10.5)
nRBC: 0 % (ref 0.0–0.2)

## 2022-11-09 LAB — URINALYSIS, W/ REFLEX TO CULTURE (INFECTION SUSPECTED)
Bacteria, UA: NONE SEEN
Bilirubin Urine: NEGATIVE
Glucose, UA: NEGATIVE mg/dL
Hgb urine dipstick: NEGATIVE
Ketones, ur: 20 mg/dL — AB
Leukocytes,Ua: NEGATIVE
Nitrite: NEGATIVE
Protein, ur: 100 mg/dL — AB
Specific Gravity, Urine: 1.043 — ABNORMAL HIGH (ref 1.005–1.030)
pH: 6 (ref 5.0–8.0)

## 2022-11-09 LAB — CBC WITH DIFFERENTIAL/PLATELET
Abs Immature Granulocytes: 0.03 10*3/uL (ref 0.00–0.07)
Basophils Absolute: 0 10*3/uL (ref 0.0–0.1)
Basophils Relative: 0 %
Eosinophils Absolute: 0 10*3/uL (ref 0.0–0.5)
Eosinophils Relative: 0 %
HCT: 36.3 % (ref 36.0–46.0)
Hemoglobin: 11.5 g/dL — ABNORMAL LOW (ref 12.0–15.0)
Immature Granulocytes: 0 %
Lymphocytes Relative: 20 %
Lymphs Abs: 1.8 10*3/uL (ref 0.7–4.0)
MCH: 27 pg (ref 26.0–34.0)
MCHC: 31.7 g/dL (ref 30.0–36.0)
MCV: 85.2 fL (ref 80.0–100.0)
Monocytes Absolute: 1 10*3/uL (ref 0.1–1.0)
Monocytes Relative: 11 %
Neutro Abs: 6.1 10*3/uL (ref 1.7–7.7)
Neutrophils Relative %: 69 %
Platelets: 256 10*3/uL (ref 150–400)
RBC: 4.26 MIL/uL (ref 3.87–5.11)
RDW: 17.4 % — ABNORMAL HIGH (ref 11.5–15.5)
WBC: 8.9 10*3/uL (ref 4.0–10.5)
nRBC: 0 % (ref 0.0–0.2)

## 2022-11-09 LAB — RESPIRATORY PANEL BY PCR

## 2022-11-09 LAB — COMPREHENSIVE METABOLIC PANEL
ALT: 31 U/L (ref 0–44)
ALT: 36 U/L (ref 0–44)
AST: 58 U/L — ABNORMAL HIGH (ref 15–41)
AST: 68 U/L — ABNORMAL HIGH (ref 15–41)
Albumin: 2.5 g/dL — ABNORMAL LOW (ref 3.5–5.0)
Albumin: 3.1 g/dL — ABNORMAL LOW (ref 3.5–5.0)
Alkaline Phosphatase: 60 U/L (ref 38–126)
Alkaline Phosphatase: 70 U/L (ref 38–126)
Anion gap: 11 (ref 5–15)
Anion gap: 17 — ABNORMAL HIGH (ref 5–15)
BUN: 21 mg/dL (ref 8–23)
BUN: 24 mg/dL — ABNORMAL HIGH (ref 8–23)
CO2: 15 mmol/L — ABNORMAL LOW (ref 22–32)
CO2: 19 mmol/L — ABNORMAL LOW (ref 22–32)
Calcium: 8.5 mg/dL — ABNORMAL LOW (ref 8.9–10.3)
Calcium: 9.4 mg/dL (ref 8.9–10.3)
Chloride: 111 mmol/L (ref 98–111)
Chloride: 111 mmol/L (ref 98–111)
Creatinine, Ser: 0.8 mg/dL (ref 0.44–1.00)
Creatinine, Ser: 1.06 mg/dL — ABNORMAL HIGH (ref 0.44–1.00)
GFR, Estimated: 57 mL/min — ABNORMAL LOW (ref >60)
GFR, Estimated: >60 mL/min (ref >60)
Glucose, Bld: 118 mg/dL — ABNORMAL HIGH (ref 70–99)
Glucose, Bld: 93 mg/dL (ref 70–99)
Potassium: 2.9 mmol/L — ABNORMAL LOW (ref 3.5–5.1)
Potassium: 4 mmol/L (ref 3.5–5.1)
Sodium: 141 mmol/L (ref 135–145)
Sodium: 143 mmol/L (ref 135–145)
Total Bilirubin: 1.3 mg/dL — ABNORMAL HIGH (ref 0.3–1.2)
Total Bilirubin: 2 mg/dL — ABNORMAL HIGH (ref 0.3–1.2)
Total Protein: 6.3 g/dL — ABNORMAL LOW (ref 6.5–8.1)
Total Protein: 7.6 g/dL (ref 6.5–8.1)

## 2022-11-09 LAB — C-REACTIVE PROTEIN: CRP: 22.4 mg/dL — ABNORMAL HIGH (ref <1.0)

## 2022-11-09 LAB — I-STAT CG4 LACTIC ACID, ED
Lactic Acid, Venous: 1.7 mmol/L (ref 0.5–1.9)
Lactic Acid, Venous: 3.2 mmol/L (ref 0.5–1.9)

## 2022-11-09 LAB — PROTIME-INR
INR: 1.4 — ABNORMAL HIGH (ref 0.8–1.2)
Prothrombin Time: 17 seconds — ABNORMAL HIGH (ref 11.4–15.2)

## 2022-11-09 LAB — LIPASE, BLOOD: Lipase: 22 U/L (ref 11–51)

## 2022-11-09 LAB — TSH: TSH: 2.839 u[IU]/mL (ref 0.350–4.500)

## 2022-11-09 LAB — MAGNESIUM: Magnesium: 1.6 mg/dL — ABNORMAL LOW (ref 1.7–2.4)

## 2022-11-09 LAB — HIV ANTIBODY (ROUTINE TESTING W REFLEX): HIV Screen 4th Generation wRfx: NONREACTIVE

## 2022-11-09 LAB — SARS CORONAVIRUS 2 BY RT PCR: SARS Coronavirus 2 by RT PCR: NEGATIVE

## 2022-11-09 LAB — APTT: aPTT: 33 seconds (ref 24–36)

## 2022-11-09 MED ORDER — ALBUTEROL SULFATE (2.5 MG/3ML) 0.083% IN NEBU: 2.5000 mg | INHALATION_SOLUTION | RESPIRATORY_TRACT | Status: DC | PRN

## 2022-11-09 MED ORDER — CALCIUM GLUCONATE-NACL 2-0.675 GM/100ML-% IV SOLN
2.0000 g | Freq: Once | INTRAVENOUS | Status: AC
  Administered 2022-11-09: 2000 mg via INTRAVENOUS
  Filled 2022-11-09: qty 100

## 2022-11-09 MED ORDER — POTASSIUM CHLORIDE 10 MEQ/100ML IV SOLN
10.0000 meq | INTRAVENOUS | Status: DC
  Administered 2022-11-09: 10 meq via INTRAVENOUS
  Filled 2022-11-09: qty 100

## 2022-11-09 MED ORDER — SODIUM CHLORIDE 0.9% FLUSH
3.0000 mL | Freq: Two times a day (BID) | INTRAVENOUS | Status: DC
  Administered 2022-11-09 – 2022-11-15 (×12): 3 mL via INTRAVENOUS

## 2022-11-09 MED ORDER — IOHEXOL 350 MG/ML SOLN
100.0000 mL | Freq: Once | INTRAVENOUS | Status: AC | PRN
  Administered 2022-11-09: 100 mL via INTRAVENOUS

## 2022-11-09 MED ORDER — FERROUS SULFATE 325 (65 FE) MG PO TABS
325.0000 mg | ORAL_TABLET | Freq: Every day | ORAL | Status: DC
  Administered 2022-11-10 – 2022-11-15 (×6): 325 mg via ORAL
  Filled 2022-11-09 (×6): qty 1

## 2022-11-09 MED ORDER — MAGNESIUM SULFATE 2 GM/50ML IV SOLN
2.0000 g | Freq: Once | INTRAVENOUS | Status: AC
  Administered 2022-11-09: 2 g via INTRAVENOUS
  Filled 2022-11-09: qty 50

## 2022-11-09 MED ORDER — PANTOPRAZOLE SODIUM 40 MG PO TBEC
40.0000 mg | DELAYED_RELEASE_TABLET | Freq: Every day | ORAL | Status: DC
  Administered 2022-11-09 – 2022-11-15 (×7): 40 mg via ORAL
  Filled 2022-11-09 (×7): qty 1

## 2022-11-09 MED ORDER — COLCHICINE 0.6 MG PO TABS: 0.6000 mg | ORAL_TABLET | Freq: Two times a day (BID) | ORAL | Status: DC

## 2022-11-09 MED ORDER — ONDANSETRON HCL 4 MG/2ML IJ SOLN
4.0000 mg | Freq: Four times a day (QID) | INTRAMUSCULAR | Status: DC | PRN
  Administered 2022-11-09: 4 mg via INTRAVENOUS
  Filled 2022-11-09: qty 2

## 2022-11-09 MED ORDER — DULOXETINE HCL 60 MG PO CPEP
60.0000 mg | ORAL_CAPSULE | Freq: Every day | ORAL | Status: DC
  Administered 2022-11-09 – 2022-11-15 (×7): 60 mg via ORAL
  Filled 2022-11-09 (×7): qty 1

## 2022-11-09 MED ORDER — SODIUM CHLORIDE 0.9 % IV SOLN
2.0000 g | Freq: Three times a day (TID) | INTRAVENOUS | Status: DC
  Administered 2022-11-09 – 2022-11-12 (×9): 2 g via INTRAVENOUS
  Filled 2022-11-09 (×9): qty 12.5

## 2022-11-09 MED ORDER — ACETAMINOPHEN 650 MG RE SUPP: 650.0000 mg | Freq: Four times a day (QID) | RECTAL | Status: DC | PRN

## 2022-11-09 MED ORDER — ONDANSETRON HCL 4 MG PO TABS: 4.0000 mg | ORAL_TABLET | Freq: Four times a day (QID) | ORAL | Status: DC | PRN

## 2022-11-09 MED ORDER — OXYBUTYNIN CHLORIDE ER 5 MG PO TB24
15.0000 mg | ORAL_TABLET | Freq: Every day | ORAL | Status: DC
  Administered 2022-11-09 – 2022-11-14 (×6): 15 mg via ORAL
  Filled 2022-11-09 (×7): qty 1

## 2022-11-09 MED ORDER — ENOXAPARIN SODIUM 40 MG/0.4ML IJ SOSY
40.0000 mg | PREFILLED_SYRINGE | INTRAMUSCULAR | Status: DC
  Administered 2022-11-10 – 2022-11-15 (×5): 40 mg via SUBCUTANEOUS
  Filled 2022-11-09 (×6): qty 0.4

## 2022-11-09 MED ORDER — ACETAMINOPHEN 325 MG PO TABS
650.0000 mg | ORAL_TABLET | Freq: Four times a day (QID) | ORAL | Status: DC | PRN
  Administered 2022-11-10 – 2022-11-13 (×2): 650 mg via ORAL
  Filled 2022-11-09 (×2): qty 2

## 2022-11-09 MED ORDER — POTASSIUM CHLORIDE 10 MEQ/100ML IV SOLN
10.0000 meq | Freq: Once | INTRAVENOUS | Status: AC
  Administered 2022-11-09: 10 meq via INTRAVENOUS
  Filled 2022-11-09: qty 100

## 2022-11-09 MED ORDER — VANCOMYCIN HCL IN DEXTROSE 1-5 GM/200ML-% IV SOLN
1000.0000 mg | INTRAVENOUS | Status: DC
  Administered 2022-11-10 – 2022-11-11 (×2): 1000 mg via INTRAVENOUS
  Filled 2022-11-09 (×2): qty 200

## 2022-11-09 MED ORDER — TRIAMCINOLONE ACETONIDE 0.5 % EX OINT: TOPICAL_OINTMENT | Freq: Two times a day (BID) | CUTANEOUS | Status: DC | PRN

## 2022-11-09 MED ORDER — POTASSIUM CHLORIDE CRYS ER 20 MEQ PO TBCR
40.0000 meq | EXTENDED_RELEASE_TABLET | Freq: Once | ORAL | Status: AC
  Administered 2022-11-09: 40 meq via ORAL
  Filled 2022-11-09: qty 2

## 2022-11-09 NOTE — ED Notes (Signed)
ED TO INPATIENT HANDOFF REPORT  ED Nurse Name and Phone #: Nicholos Johns [phone]  S Name/Age/Gender Christine [name] 72 y.o. female Room/Bed: 042C/042C  Code Status   Code Status: Full Code  Home/SNF/Other Home Patient oriented to: self and place Is this baseline? No   Triage Complete: Triage complete  Chief Complaint SIRS (systemic inflammatory response syndrome) (HCC) [R65.10] Sepsis (HCC) [A41.9]  Triage Note No notes on file   Allergies Allergies  Allergen Reactions   Prednisone Swelling    Legs swell    Tape Hives   Sulfa Antibiotics Rash    Level of Care/Admitting Diagnosis ED Disposition     ED Disposition  Admit   Condition  --   Comment  Hospital Area: [facility] [[id number]]  Level of Care: Telemetry Medical [[id number]]  May admit patient to [name] or [name] if equivalent level of care is available:: No  Covid Evaluation: Asymptomatic - no recent exposure (last 10 days) testing not required  Diagnosis: Sepsis [facility]) [[id number]]  Admitting Physician: Clydie Braun [[id number]]  Attending Physician: Clydie Braun [[id number]]  Certification:: I certify this patient will need inpatient services for at least 2 midnights  Estimated Length of Stay: 2          B Medical/Surgery History Past Medical History:  Diagnosis Date   Allergy    dust, pollen, sulfa, prednisone   Anemia    Anxiety    Arthritis    "knees" (04/26/2016)   Colon polyps    benign per pt   Coronary artery disease    mild per 2015 cath in [place] (OM1 30%, RCA 30%)   GERD (gastroesophageal reflux disease)    Gout    History of hiatal hernia    Hyperlipidemia 05/20/2021   Hypertension    Migraine    "none since early /2017" (05/06/2016)   Obesity    OSA on CPAP    uses CPAP   Pre-diabetes    Pre-operative cardiovascular examination 08/22/2008   Vasculitis (HCC)    Bilateral   Past Surgical History:  Procedure Laterality Date   ABDOMINAL HYSTERECTOMY      "partial"; both ovaries present   APPENDECTOMY  05/06/2016   BIOPSY  04/08/2022   Procedure: BIOPSY;  Surgeon: Sherrilyn Rist, MD;  Location: [facility];  Service: Gastroenterology;;   CARDIAC CATHETERIZATION  ~ 2015   CARDIAC CATHETERIZATION  2015   In [place]   CHILECTOMY Right 06/01/2017   Procedure: CHILECTOMY RIGHT FOOT;  Surgeon: Felecia Shelling, DPM;  Location: [facility] OR;  Service: Podiatry;  Laterality: Right;   ESOPHAGOGASTRODUODENOSCOPY (EGD) WITH PROPOFOL N/A 04/08/2022   Procedure: ESOPHAGOGASTRODUODENOSCOPY (EGD) WITH PROPOFOL;  Surgeon: Sherrilyn Rist, MD;  Location: [facility];  Service: Gastroenterology;  Laterality: N/A;   LAPAROSCOPIC APPENDECTOMY N/A 05/06/2016   Procedure: LAPAROSCOPIC APPENDECTOMY;  Surgeon: Manus Rudd, MD;  Location: [facility] OR;  Service: General;  Laterality: N/A;   POLYPECTOMY  04/08/2022   Procedure: POLYPECTOMY;  Surgeon: Sherrilyn Rist, MD;  Location: [facility];  Service: Gastroenterology;;   TONSILLECTOMY       A IV Location/Drains/Wounds Patient Lines/Drains/Airways Status     Active Line/Drains/Airways     Name Placement date Placement time Site Days   Peripheral IV 11/09/22 20 G 2.5" Anterior;Right Forearm 11/09/22  0038  Forearm  less than 1   Peripheral IV 11/09/22 18 G 2.5" Anterior;Proximal;Right;Upper Arm 11/09/22  0224  Arm  less than 1   Incision -  3 Ports Abdomen 1: Umbilicus 2: Upper;Lateral 3: Left;Lower;Lateral 05/06/16  1045  -- 2378   Incision - 5 Ports 1: Right;Lateral 2: Right;Medial 3: Left;Medial 4: Left;Lateral 5: Upper 09/14/22  1445  -- 56            Intake/Output Last 24 hours  Intake/Output Summary (Last 24 hours) at 11/09/2022 1203 Last data filed at 11/09/2022 0530 Gross per 24 hour  Intake 4099.69 ml  Output --  Net 4099.69 ml    Labs/Imaging Results for orders placed or performed during the hospital encounter of 11/08/22 (from the past 48 hour(s))  Comprehensive metabolic panel      Status: Abnormal   Collection Time: 11/08/22 11:35 PM  Result Value Ref Range   Sodium 143 135 - 145 mmol/L   Potassium 2.9 (L) 3.5 - 5.1 mmol/L   Chloride 111 98 - 111 mmol/L   CO2 15 (L) 22 - 32 mmol/L   Glucose, Bld 118 (H) 70 - 99 mg/dL    Comment: Glucose reference range applies only to samples taken after fasting for at least 8 hours.   BUN 24 (H) 8 - 23 mg/dL   Creatinine, Ser 2.53 (H) 0.44 - 1.00 mg/dL   Calcium 9.4 8.9 - 66.4 mg/dL   Total Protein 7.6 6.5 - 8.1 g/dL   Albumin 3.1 (L) 3.5 - 5.0 g/dL   AST 58 (H) 15 - 41 U/L   ALT 31 0 - 44 U/L   Alkaline Phosphatase 70 38 - 126 U/L   Total Bilirubin 2.0 (H) 0.3 - 1.2 mg/dL   GFR, Estimated 57 (L) >60 mL/min    Comment: (NOTE) Calculated using the CKD-EPI Creatinine Equation (2021)    Anion gap 17 (H) 5 - 15    Comment: Performed at [facility], [address] [place], [place] [address]  Lipase, blood     Status: None   Collection Time: 11/08/22 11:35 PM  Result Value Ref Range   Lipase 22 11 - 51 U/L    Comment: Performed at [facility], [address], [place], [place] [address]  CBC with Diff     Status: Abnormal   Collection Time: 11/08/22 11:35 PM  Result Value Ref Range   WBC 8.9 4.0 - 10.5 K/uL   RBC 4.26 3.87 - 5.11 MIL/uL   Hemoglobin 11.5 (L) 12.0 - 15.0 g/dL   HCT 63.8 75.6 - 43.3 %   MCV 85.2 80.0 - 100.0 fL   MCH 27.0 26.0 - 34.0 pg   MCHC 31.7 30.0 - 36.0 g/dL   RDW 29.5 (H) 18.8 - 41.6 %   Platelets 256 150 - 400 K/uL   nRBC 0.0 0.0 - 0.2 %   Neutrophils Relative % 69 %   Neutro Abs 6.1 1.7 - 7.7 K/uL   Lymphocytes Relative 20 %   Lymphs Abs 1.8 0.7 - 4.0 K/uL   Monocytes Relative 11 %   Monocytes Absolute 1.0 0.1 - 1.0 K/uL   Eosinophils Relative 0 %   Eosinophils Absolute 0.0 0.0 - 0.5 K/uL   Basophils Relative 0 %   Basophils Absolute 0.0 0.0 - 0.1 K/uL   Immature Granulocytes 0 %   Abs Immature Granulocytes 0.03 0.00 - 0.07 K/uL    Comment: Performed at [facility]  [facility], [address] [place], [place] [address]  Protime-INR     Status: Abnormal   Collection Time: 11/08/22 11:35 PM  Result Value Ref Range   Prothrombin  Time 17.0 (H) 11.4 - 15.2 seconds   INR 1.4 (H) 0.8 - 1.2    Comment: (NOTE) INR goal varies based on device and disease states. Performed at [facility], [address] [place], [place] [address]   APTT     Status: None   Collection Time: 11/08/22 11:35 PM  Result Value Ref Range   aPTT 33 24 - 36 seconds    Comment: Performed at [facility], [address] [place], [place] [address]  Blood Culture (routine x 2)     Status: None (Preliminary result)   Collection Time: 11/08/22 11:37 PM   Specimen: BLOOD  Result Value Ref Range   Specimen Description BLOOD SITE NOT SPECIFIED    Special Requests      BOTTLES DRAWN AEROBIC AND ANAEROBIC Blood Culture adequate volume   Culture      NO GROWTH < 12 HOURS Performed at [facility], [address] [place], [place] [address]    Report Status PENDING   Blood Culture (routine x 2)     Status: None (Preliminary result)   Collection Time: 11/08/22 11:42 PM   Specimen: BLOOD  Result Value Ref Range   Specimen Description BLOOD RIGHT ANTECUBITAL    Special Requests      BOTTLES DRAWN AEROBIC AND ANAEROBIC Blood Culture adequate volume   Culture      NO GROWTH < 12 HOURS Performed at [facility], [address] [place], [place] [address]    Report Status PENDING   I-Stat Lactic Acid, ED     Status: Abnormal   Collection Time: 11/09/22 12:02 AM  Result Value Ref Range   Lactic Acid, Venous 3.2 (HH) 0.5 - 1.9 mmol/L   Comment NOTIFIED PHYSICIAN   Urinalysis, w/ Reflex to Culture (Infection Suspected) -Urine, Clean Catch     Status: Abnormal   Collection Time: 11/09/22  1:55 AM  Result Value Ref Range   Specimen Source URINE, CLEAN CATCH    Color, Urine AMBER (A) YELLOW    Comment: BIOCHEMICALS MAY BE AFFECTED BY COLOR   APPearance CLEAR CLEAR    Specific Gravity, Urine 1.043 (H) 1.005 - 1.030   pH 6.0 5.0 - 8.0   Glucose, UA NEGATIVE NEGATIVE mg/dL   Hgb urine dipstick NEGATIVE NEGATIVE   Bilirubin Urine NEGATIVE NEGATIVE   Ketones, ur 20 (A) NEGATIVE mg/dL   Protein, ur 952 (A) NEGATIVE mg/dL   Nitrite NEGATIVE NEGATIVE   Leukocytes,Ua NEGATIVE NEGATIVE   RBC / HPF 0-5 0 - 5 RBC/hpf   WBC, UA 0-5 0 - 5 WBC/hpf    Comment:        Reflex urine culture not performed if WBC <=10, OR if Squamous epithelial cells >5. If Squamous epithelial cells >5 suggest recollection.    Bacteria, UA NONE SEEN NONE SEEN   Squamous Epithelial / HPF 0-5 0 - 5 /HPF   Mucus PRESENT     Comment: Performed at [facility], [address], [place], [place] [address]  I-Stat Lactic Acid, ED     Status: None   Collection Time: 11/09/22  2:24 AM  Result Value Ref Range   Lactic Acid, Venous 1.7 0.5 - 1.9 mmol/L  CBC     Status: Abnormal   Collection Time: 11/09/22  9:30 AM  Result Value Ref Range   WBC 9.7 4.0 - 10.5 K/uL   RBC 3.67 (L) 3.87 - 5.11 MIL/uL   Hemoglobin 9.6 (  L) 12.0 - 15.0 g/dL   HCT 16.1 (L) 09.6 - 04.5 %   MCV 84.7 80.0 - 100.0 fL   MCH 26.2 26.0 - 34.0 pg   MCHC 30.9 30.0 - 36.0 g/dL   RDW 40.9 (H) 81.1 - 91.4 %   Platelets 223 150 - 400 K/uL   nRBC 0.0 0.0 - 0.2 %    Comment: Performed at [facility], [address] [place], [place] [address]  Comprehensive metabolic panel     Status: Abnormal   Collection Time: 11/09/22  9:30 AM  Result Value Ref Range   Sodium 141 135 - 145 mmol/L   Potassium 4.0 3.5 - 5.1 mmol/L   Chloride 111 98 - 111 mmol/L   CO2 19 (L) 22 - 32 mmol/L   Glucose, Bld 93 70 - 99 mg/dL    Comment: Glucose reference range applies only to samples taken after fasting for at least 8 hours.   BUN 21 8 - 23 mg/dL   Creatinine, Ser 6.21 0.44 - 1.00 mg/dL   Calcium 8.5 (L) 8.9 - 10.3 mg/dL   Total Protein 6.3 (L) 6.5 - 8.1 g/dL   Albumin 2.5 (L) 3.5 - 5.0 g/dL   AST 68 (H) 15 - 41 U/L   ALT  36 0 - 44 U/L   Alkaline Phosphatase 60 38 - 126 U/L   Total Bilirubin 1.3 (H) 0.3 - 1.2 mg/dL   GFR, Estimated >30 >86 mL/min    Comment: (NOTE) Calculated using the CKD-EPI Creatinine Equation (2021)    Anion gap 11 5 - 15    Comment: Performed at [facility], [address], [place], [place] [address]  Magnesium     Status: Abnormal   Collection Time: 11/09/22  9:30 AM  Result Value Ref Range   Magnesium 1.6 (L) 1.7 - 2.4 mg/dL    Comment: Performed at [facility], [address], [place], [place] [address]  C-reactive protein     Status: Abnormal   Collection Time: 11/09/22  9:30 AM  Result Value Ref Range   CRP 22.4 (H) <1.0 mg/dL    Comment: Performed at [facility], [address], [place], [place] [address]   CT ABDOMEN PELVIS W CONTRAST  Result Date: 11/09/2022 CLINICAL DATA:  Abdominal pain EXAM: CT ABDOMEN AND PELVIS WITH CONTRAST TECHNIQUE: Multidetector CT imaging of the abdomen and pelvis was performed using the standard protocol following bolus administration of intravenous contrast. RADIATION DOSE REDUCTION: This exam was performed according to the departmental dose-optimization program which includes automated exposure control, adjustment of the mA and/or kV according to patient size and/or use of iterative reconstruction technique. CONTRAST:  OMNIPAQUE IOHEXOL 350 MG/ML SOLN COMPARISON:  10/15/2022 FINDINGS: Lower Chest: Normal. Hepatobiliary: Normal hepatic contours. No intra- or extrahepatic biliary dilatation. Normal gallbladder. Pancreas: Normal pancreas. No ductal dilatation or peripancreatic fluid collection. Spleen: Normal. Adrenals/Urinary Tract: The adrenal glands are normal. Unchanged appearance of left lower pole renal cyst measuring up to 6.7 cm, Bosniak class 2, no follow-up imaging required. No, Nephroureterolithiasis or solid renal mass. The urinary bladder is normal for degree of distention Stomach/Bowel: There is no hiatal hernia.  Normal duodenal course and caliber. Prior gastric surgery. No small bowel dilatation or inflammation. No focal colonic abnormality. Normal appendix. Vascular/Lymphatic: There is calcific atherosclerosis of the abdominal aorta. No lymphadenopathy. Reproductive: Status post hysterectomy. No adnexal mass. Other: None. Musculoskeletal: No bony spinal canal stenosis or focal osseous abnormality.  IMPRESSION: No acute abnormality of the abdomen or pelvis. Aortic atherosclerosis (ICD10-I70.0). Electronically Signed   By: Deatra Robinson M.D.   On: 11/09/2022 01:37   DG Chest Port 1 View  Result Date: 11/08/2022 CLINICAL DATA:  Possible sepsis EXAM: PORTABLE CHEST 1 VIEW COMPARISON:  01/12/2022 FINDINGS: The heart size and mediastinal contours are within normal limits. Aortic atherosclerosis. Both lungs are clear. The visualized skeletal structures are unremarkable. IMPRESSION: No active disease. Electronically Signed   By: Jasmine Pang M.D.   On: 11/08/2022 23:48    Pending Labs Unresulted Labs (From admission, onward)     Start     Ordered   11/10/22 0500  CBC  Tomorrow morning,   R        11/09/22 0923   11/10/22 0500  Basic metabolic panel  Tomorrow morning,   R        11/09/22 0923   11/09/22 0907  HIV Antibody (routine testing w rflx)  (HIV Antibody (Routine testing w reflex) panel)  Once,   R        11/09/22 0923   11/09/22 0902  Respiratory (~20 pathogens) panel by PCR  (Respiratory panel by PCR (~20 pathogens, ~24 hr TAT)  w precautions)  Once,   R        11/09/22 0901   11/09/22 0902  SARS Coronavirus 2 by RT PCR (hospital order, performed in [facility] hospital lab) *cepheid single result test* Nasopharyngeal Swab  (Tier 2 - SARS Coronavirus 2 by RT PCR (hospital order, performed in [facility] hospital lab) *cepheid single result test*)  Once,   R        11/09/22 0901            Vitals/Pain Today's Vitals   11/09/22 0945 11/09/22 1000 11/09/22 1015 11/09/22 1045  BP: 95/69 116/77 (!)  133/56 105/64  Pulse: 70 68 70 89  Resp:      Temp:      TempSrc:      SpO2: 100% 100% 100% 97%  Weight:      Height:      PainSc:        Isolation Precautions Airborne and Contact precautions  Medications Medications  enoxaparin (LOVENOX) injection 40 mg (has no administration in time range)  sodium chloride flush (NS) 0.9 % injection 3 mL (3 mLs Intravenous Not Given 11/09/22 1055)  acetaminophen (TYLENOL) tablet 650 mg (has no administration in time range)    Or  acetaminophen (TYLENOL) suppository 650 mg (has no administration in time range)  ondansetron (ZOFRAN) tablet 4 mg (has no administration in time range)    Or  ondansetron (ZOFRAN) injection 4 mg (has no administration in time range)  albuterol (PROVENTIL) (2.5 MG/3ML) 0.083% nebulizer solution 2.5 mg (has no administration in time range)  ceFEPIme (MAXIPIME) 2 g in sodium chloride 0.9 % 100 mL IVPB (has no administration in time range)  vancomycin (VANCOCIN) IVPB 1000 mg/200 mL premix (has no administration in time range)  lactated ringers bolus 1,000 mL (0 mLs Intravenous Stopped 11/09/22 0035)    And  lactated ringers bolus 1,000 mL (0 mLs Intravenous Stopped 11/09/22 0303)    And  lactated ringers bolus 1,000 mL (0 mLs Intravenous Stopped 11/09/22 0300)  ceFEPIme (MAXIPIME) 2 g in sodium chloride 0.9 % 100 mL IVPB (0 g Intravenous Stopped 11/09/22 0530)  metroNIDAZOLE (FLAGYL) IVPB 500 mg (0 mg Intravenous Stopped 11/09/22 0423)  vancomycin (VANCOREADY) IVPB 2000 mg/400 mL (0 mg Intravenous Stopped 11/09/22  4696)  acetaminophen (TYLENOL) tablet 1,000 mg (1,000 mg Oral Given 11/09/22 0101)  iohexol (OMNIPAQUE) 350 MG/ML injection 100 mL (100 mLs Intravenous Contrast Given 11/09/22 0125)  potassium chloride 10 mEq in 100 mL IVPB (0 mEq Intravenous Stopped 11/09/22 0446)  potassium chloride SA (KLOR-CON M) CR tablet 40 mEq (40 mEq Oral Given 11/09/22 0458)    Mobility walks with person assist     Focused  Assessments Cardiac Assessment Handoff:    No results found for: "CKTOTAL", "CKMB", "CKMBINDEX", "TROPONINI" No results found for: "DDIMER" Does the Patient currently have chest pain? No    R Recommendations: See Admitting Provider Note  Report given to:   Additional Notes: .

## 2022-11-09 NOTE — Progress Notes (Signed)
Patient refused Cpap for the night

## 2022-11-09 NOTE — Progress Notes (Signed)
Pharmacy Antibiotic Note  Christine [name] is a 72 y.o. female admitted on 11/08/2022 with sepsis unknown source. Concerned for cellulitis. Pharmacy has been consulted for Vancomycin/Cefepime dosing. Afebrile. WBC 8.9. Scr 1.06. LA 1.7.  Plan: Vancomycin 1000 mg every 24-hours (eAUC 468) Cefepime 2 g every 8-hours Monitor daily Scr, CBC, temp, and for clinical signs of improvement  Monitor vancomycin levels at steady-state F/U cultures and de-escalate antibiotics as indicated  Height: 5\' 1"  (154.9 cm) Weight: 119.3 kg (263 lb) IBW/kg (Calculated) : 47.8  Temp (24hrs), Avg:99 F (37.2 C), Min:97.3 F (36.3 C), Max:102.1 F (38.9 C)  Recent Labs  Lab 11/08/22 2335 11/09/22 0002 11/09/22 0224  WBC 8.9  --   --   CREATININE 1.06*  --   --   LATICACIDVEN  --  3.2* 1.7    Estimated Creatinine Clearance: 60.4 mL/min (A) (by C-G formula based on SCr of 1.06 mg/dL (H)).    Allergies  Allergen Reactions   Prednisone Swelling    Legs swell    Tape Hives   Sulfa Antibiotics Rash    Antimicrobials this admission: Vancomycin 7/15 >>  Cefepime 7/15 >>   Microbiology results: 7/15 BCx: ip 7/16 MRSA PCR: ip  Thank you for allowing pharmacy to be a part of this patient's care.  Laqueta Jean PharmD Candidate 11/09/2022 10:26 AM

## 2022-11-09 NOTE — H&P (Addendum)
History and Physical    Patient: Christine [name] NWG:[id number] DOB: 08-24-1950 DOA: 11/08/2022 DOS: the patient was seen and examined on 11/09/2022 PCP: [name], DO  Patient coming from: Home via EMS  Chief Complaint:  Chief Complaint  Patient presents with   Constipation    Patient to ED via EMS with complaint of no BM x 3 days. Patient arrives to ED with soiled dress from [facility] and urine   HPI: Christine [name] is a 72 y.o. female with medical history significant of HTN, HLD, CAD, gout, venous stasis, morbid obesity s/p Roux-en-Y gastric bypass, OSA on CPAP, and GERD who presents with complaints of bilateral foot pain.  History is obtained from the patient who is more alert at this time.  At baseline she lives at home alone and ambulates intermittently with use of a cane when outside of home.  At this time she reports symptoms started approximately 4 days ago with pain in the soles of both of her feet right foot worse than the left.  She suspected she was having a gout flare.  She had called in medications, but knew she likely would not be able to pick it up until after the weekend.  Previously told that she should not take ibuprofen after her Roux-en-Y and therefore did not have anything to treat the symptoms.  Following day she reported that she the pain in her feet was so severe that she was unable to get out of bed.  She laid there for the next couple of days.  Patient noted having associated symptoms of urinary frequency and reported redness of the bilateral lower extremities.  Denied having any significant injury to her legs, diarrhea, nausea, vomiting, abdominal pain, blood in stools, cough, recent sick contacts.   Her sister had not been able to get in contact with her and called police to do a welfare check. She did report also having some mild shortness of breath en route with EMS.   In the emergency department patient was noted to be febrile up to 102.1 F with tachypnea, and  all other vital signs relatively maintained.  Labs from 7/15 noted WBC 8.9, hemoglobin 11.5, potassium 2.9 CO2 15, BUN 24, creatinine 1.06, glucose 118,  anion gap 17, and lactic acid 3.2.  Chest x-ray noted no acute abnormality.  Urinalysis noted 20 of ketones, 100 protein, elevated specific gravity of 1.043, and no signs for infection.  CT scan of the abdomen pelvis was obtained, but also did not note any acute abnormality.  Blood cultures have been obtained.  Patient was bolused 3 L of lactated Ringer's, given acetaminophen 1000 mg p.o.,50 mEq of potassium chloride, vancomycin, metronidazole, and cefepime.  Review of Systems: As mentioned in the history of present illness. All other systems reviewed and are negative. Past Medical History:  Diagnosis Date   Allergy    dust, pollen, sulfa, prednisone   Anemia    Anxiety    Arthritis    "knees" (04/26/2016)   Colon polyps    benign per pt   Coronary artery disease    mild per 2015 cath in [place] (OM1 30%, RCA 30%)   GERD (gastroesophageal reflux disease)    Gout    History of hiatal hernia    Hyperlipidemia 05/20/2021   Hypertension    Migraine    "none since early /2017" (05/06/2016)   Obesity    OSA on CPAP    uses CPAP   Pre-diabetes    Pre-operative cardiovascular examination  08/22/2008   Vasculitis (HCC)    Bilateral   Past Surgical History:  Procedure Laterality Date   ABDOMINAL HYSTERECTOMY     "partial"; both ovaries present   APPENDECTOMY  05/06/2016   BIOPSY  04/08/2022   Procedure: BIOPSY;  Surgeon: Sherrilyn Rist, MD;  Location: [facility];  Service: Gastroenterology;;   CARDIAC CATHETERIZATION  ~ 2015   CARDIAC CATHETERIZATION  2015   In [place]   CHILECTOMY Right 06/01/2017   Procedure: CHILECTOMY RIGHT FOOT;  Surgeon: Felecia Shelling, DPM;  Location: [facility];  Service: Podiatry;  Laterality: Right;   ESOPHAGOGASTRODUODENOSCOPY (EGD) WITH PROPOFOL N/A 04/08/2022   Procedure: ESOPHAGOGASTRODUODENOSCOPY (EGD)  WITH PROPOFOL;  Surgeon: Sherrilyn Rist, MD;  Location: [facility];  Service: Gastroenterology;  Laterality: N/A;   LAPAROSCOPIC APPENDECTOMY N/A 05/06/2016   Procedure: LAPAROSCOPIC APPENDECTOMY;  Surgeon: Manus Rudd, MD;  Location: [facility];  Service: General;  Laterality: N/A;   POLYPECTOMY  04/08/2022   Procedure: POLYPECTOMY;  Surgeon: Sherrilyn Rist, MD;  Location: [facility];  Service: Gastroenterology;;   TONSILLECTOMY     Social History:  reports that she quit smoking about 44 years ago. Her smoking use included cigarettes. She started smoking about 49 years ago. She has a 0.6 pack-year smoking history. She has never used smokeless tobacco. She reports that she does not drink alcohol and does not use drugs.  Allergies  Allergen Reactions   Prednisone Swelling    Legs swell    Tape Hives   Sulfa Antibiotics Rash    Family History  Problem Relation Age of Onset   Diabetes Mother    Heart disease Mother    High blood pressure Mother    Asthma Mother    Arthritis Mother    Diabetes Father    Heart disease Father    High blood pressure Father    Asthma Father    Diabetes Sister    Alcohol abuse Brother    Drug abuse Brother    Arthritis Sister    Diabetes Sister    Varicose Veins Sister    Colon cancer Neg Hx    Esophageal cancer Neg Hx    Rectal cancer Neg Hx     Prior to Admission medications   Medication Sig Start Date End Date Taking? Authorizing Provider  colchicine 0.6 MG tablet Take 2 tabs at first sign of a flare and repeat 1 tab 1 hr later. Take 1 tab twice daily until flare resolves. 11/08/22   [name], DO  augmented betamethasone dipropionate (DIPROLENE-AF) 0.05 % cream Apply 1 Application topically 2 (two) times daily. 07/12/22   [provider]  DULoxetine (CYMBALTA) 60 MG capsule Take 1 capsule (60 mg total) by mouth daily. 12/25/21   [name], DO  ferrous sulfate 325 (65 FE) MG EC tablet Take 325 mg by  mouth daily with breakfast.    [provider]  metoprolol tartrate (LOPRESSOR) 50 MG tablet Take 1 tablet (50 mg total) by mouth 2 (two) times daily. 09/06/22   [name], DO  oxybutynin (DITROPAN XL) 15 MG 24 hr tablet Take 15 mg by mouth at bedtime.    [provider]  pantoprazole (PROTONIX) 40 MG tablet Take 1 tablet (40 mg total) by mouth daily. 09/15/22   Stechschulte, Hyman Hopes, MD  promethazine (PHENERGAN) 25 MG tablet Take 1 tablet (25 mg total) by mouth every 6 (six) hours as needed for nausea or vomiting. 10/27/22  [name], DO  rosuvastatin (CRESTOR) 10 MG tablet Take 1 tablet (10 mg total) by mouth daily. 12/25/21   [name], DO  triamterene-hydrochlorothiazide (MAXZIDE-25) 37.5-25 MG tablet Take 1 tablet by mouth daily. 09/28/22   [name], DO    Physical Exam: Vitals:   11/09/22 0530 11/09/22 0600 11/09/22 0700 11/09/22 0818  BP: (!) 140/74 (!) 144/71 (!) 147/73   Pulse: 72 71 64   Resp: (!) 26 (!) 23 (!) 24   Temp:    (!) 97.5 F (36.4 C)  TempSrc:    Oral  SpO2: 99% 99% 100%   Weight:      Height:       Constitutional: Obese female currently in no acute distress Eyes: PERRL, lids and conjunctivae normal ENMT: Mucous membranes are dry.  Normal dentition Neck: normal, supple Respiratory: clear to auscultation bilaterally, no wheezing, no crackles. Normal respiratory effort. No accessory muscle use.  Cardiovascular: Regular rate and rhythm, no murmurs / rubs / gallops.  Trace left lower extremity edema.  2+ pedal pulses.   Abdomen: no tenderness, no masses palpated. No hepatosplenomegaly. Bowel sounds positive.  Musculoskeletal: no clubbing / cyanosis. No joint deformity upper and lower extremities. Good ROM, no contractures. Normal muscle tone.  Skin: Venous stasis changes of the bilateral lower extremities with mild edema no visible wounds or drainage appreciated. Neurologic: CN 2-12 grossly intact.  .  Strength 5/5 in all 4.  Psychiatric: Normal judgment and insight. Alert and oriented x 3. Normal mood.   Data Reviewed:  \EKG revealed normal sinus rhythm at 76 bpm.  Reviewed labs, imaging, and pertinent records as noted in this document.  Assessment and Plan: Sepsis, unknown source Acute.  Patient presented with fever up to 102.1 F with tachypnea meeting SIRS criteria.  She reported having redness of the bilateral lower extremities.  Chest x-ray and urinalysis did not note any concern for infection.  Initial lactic acid was elevated at 3.2.  Blood cultures have been obtained.  Patient has been given full dose bolus and started on empiric antibiotics of vancomycin, metronidazole, and cefepime.  Question the possibility of cellulitis of the right lower extremity. -Admit to a telemetry bed -Check respiratory virus panel -Check CRP. -Follow-up blood cultures  -Continue empiric antibiotics of vancomycin and cefepime  Acute metabolic encephalopathy Patient was initially confused, but seems to be more alert and oriented at this time.  CT scan of the head did not note any acute abnormality.  Patient does have memory difficulties noted problem list for which there could be an aspect of dementia. -Delirium precautions -Neurochecks -Check TSH -May need of further workup if symptoms persist  Hypokalemia Acute.  Initial potassium noted to be 2.9 on 7/15.  Patient on hydrochlorothiazide which could be a likely cause for low potassium levels.  Patient had been given a total of potassium chloride 50 meq in the ED. -Recheck potassium and magnesium levels replace as needed  Renal insufficiency Creatinine 1.06 with BUN 24.  Elevated BUN to creatinine ratio suggest prerenal cause.  Baseline creatinine noted to be anywhere from 0.7-0.9. patient had initially been bolused 3 L of IV fluids in the ED. -Avoid nephrotoxic agents -Recheck kidney function  Normocytic anemia Hemoglobin 11.5, but records note  baseline hemoglobin previously 9-10.  Suspect hemoconcentration as the reason for the acute elevation.  Patient denied any reports of bleeding. -Continue to monitor  Hypertension Blood pressures anywhere from 116/52 to 164/84.  -Hold triamterene-hydrochlorothiazide due to initial  renal insufficiency.  Reassess and determine when medically appropriate to resume.  Gout Patient reported having acute gout flare in the soles of her feet that started 3 days ago.  She reported that she had ran out of the colchicine that she normally uses for acute flares.  Gout flare appears to be over at this time. -Physical therapy to evaluate and treat  Elevated liver function studies Acute.  Labs noted AST 58 and total bilirubin 2.  CT scan of the abdomen pelvis did not note any acute abnormality of the liver or gallbladder. -Recheck LFTs  Hyperlipidemia Patient reported that Crestor had been placed on hold due to Roux-en-Y  OSA on CPAP -Continue CPAP at night  Morbid obesity  S/p Roux-en-Y Patient underwent Roux-en-Y gastric bypass 09/14/2022 with Dr. Dossie Der.  BMI 49.69 kg/m -Continue outpatient follow-up with surgery   DVT prophylaxis: Lovenox Advance Care Planning:   Code Status: Full Code    Consults: None  Family Communication: Attempted to call patient's sister over the phone and left message on voicemail  Severity of Illness: The appropriate patient status for this patient is INPATIENT. Inpatient status is judged to be reasonable and necessary in order to provide the required intensity of service to ensure the patient's safety. The patient's presenting symptoms, physical exam findings, and initial radiographic and laboratory data in the context of their chronic comorbidities is felt to place them at high risk for further clinical deterioration. Furthermore, it is not anticipated that the patient will be medically stable for discharge from the hospital within 2 midnights of admission.    * I certify that at the point of admission it is my clinical judgment that the patient will require inpatient hospital care spanning beyond 2 midnights from the point of admission due to high intensity of service, high risk for further deterioration and high frequency of surveillance required.*  Author: Clydie Braun, MD 11/09/2022 8:53 AM  For on call review [email or web address].

## 2022-11-10 DIAGNOSIS — G9341 Metabolic encephalopathy: Secondary | ICD-10-CM | POA: Diagnosis not present

## 2022-11-10 LAB — IRON AND TIBC
Iron: 21 ug/dL — ABNORMAL LOW (ref 28–170)
Saturation Ratios: 11 % (ref 10.4–31.8)
TIBC: 195 ug/dL — ABNORMAL LOW (ref 250–450)
UIBC: 174 ug/dL

## 2022-11-10 LAB — CBC
HCT: 31.8 % — ABNORMAL LOW (ref 36.0–46.0)
Hemoglobin: 9.9 g/dL — ABNORMAL LOW (ref 12.0–15.0)
MCH: 26.2 pg (ref 26.0–34.0)
MCHC: 31.1 g/dL (ref 30.0–36.0)
MCV: 84.1 fL (ref 80.0–100.0)
Platelets: 245 10*3/uL (ref 150–400)
RBC: 3.78 MIL/uL — ABNORMAL LOW (ref 3.87–5.11)
RDW: 17.8 % — ABNORMAL HIGH (ref 11.5–15.5)
WBC: 6.5 10*3/uL (ref 4.0–10.5)
nRBC: 0 % (ref 0.0–0.2)

## 2022-11-10 LAB — BASIC METABOLIC PANEL
Anion gap: 8 (ref 5–15)
BUN: 19 mg/dL (ref 8–23)
CO2: 19 mmol/L — ABNORMAL LOW (ref 22–32)
Calcium: 8.8 mg/dL — ABNORMAL LOW (ref 8.9–10.3)
Chloride: 112 mmol/L — ABNORMAL HIGH (ref 98–111)
Creatinine, Ser: 0.68 mg/dL (ref 0.44–1.00)
GFR, Estimated: >60 mL/min (ref >60)
Glucose, Bld: 131 mg/dL — ABNORMAL HIGH (ref 70–99)
Potassium: 3.6 mmol/L (ref 3.5–5.1)
Sodium: 139 mmol/L (ref 135–145)

## 2022-11-10 LAB — FOLATE: Folate: 4 ng/mL — ABNORMAL LOW (ref >5.9)

## 2022-11-10 LAB — RETICULOCYTES
Immature Retic Fract: 8.5 % (ref 2.3–15.9)
RBC.: 3.78 MIL/uL — ABNORMAL LOW (ref 3.87–5.11)
Retic Count, Absolute: 30.6 10*3/uL (ref 19.0–186.0)
Retic Ct Pct: 0.8 % (ref 0.4–3.1)

## 2022-11-10 LAB — MRSA NEXT GEN BY PCR, NASAL: MRSA by PCR Next Gen: NOT DETECTED

## 2022-11-10 LAB — VITAMIN B12: Vitamin B-12: 632 pg/mL (ref 180–914)

## 2022-11-10 LAB — FERRITIN: Ferritin: 430 ng/mL — ABNORMAL HIGH (ref 11–307)

## 2022-11-10 MED ORDER — TRIAMTERENE-HCTZ 37.5-25 MG PO TABS
1.0000 | ORAL_TABLET | Freq: Every day | ORAL | Status: DC
  Administered 2022-11-10 – 2022-11-15 (×6): 1 via ORAL
  Filled 2022-11-10 (×6): qty 1

## 2022-11-10 MED ORDER — GABAPENTIN 100 MG PO CAPS
100.0000 mg | ORAL_CAPSULE | Freq: Three times a day (TID) | ORAL | Status: DC
  Administered 2022-11-10 – 2022-11-15 (×15): 100 mg via ORAL
  Filled 2022-11-10 (×15): qty 1

## 2022-11-10 NOTE — TOC Initial Note (Addendum)
Transition of Care [facility]) - Initial/Assessment Note    Patient Details  Name: Christine [name] MRN: [id number] Date of Birth: 19-Oct-1950  Transition of Care [facility]) CM/SW Contact:    Janae Bridgeman, RN Phone Number: 11/10/2022, 12:28 PM  Clinical Narrative:                 CM met with the patient at the bedside to discuss TOC needs and SNF placement.  The patient lives alone and her sister was unable to reach her by phone and called the police to place a [facility] visit.  The patient was admitted for Sepsis and is in agreement for SNF work up.  DME at the home includes CPAP machine, CAne.  PASRR was completed - [id number].  OT completed the evaluation and TOC Team is waiting on PT eval today to complete the FL@ and fax patient out for bed offers.  I requested that the patient contact her neighbor to obtain her CPAP mask and machine since the patient will need to take with her to the SNF facility.  Patient is aware.  11/10/22 1250 - FL2 completed and patient faxed out for bed offers.  Kiva [place], TOC MSW is aware and will follow up with the patient.  Expected Discharge Plan: Skilled Nursing Facility Barriers to Discharge: Continued Medical Work up   Patient Goals and CMS Choice Patient states their goals for this hospitalization and ongoing recovery are:: to get better - agreeable to SNF placement for STR CMS Medicare.gov Compare Post Acute Care list provided to:: Patient Choice offered to / list presented to : Patient [place] ownership interest in [facility].provided to:: Patient    Expected Discharge Plan and Services   Discharge Planning Services: CM Consult Post Acute Care Choice: Skilled Nursing Facility Living arrangements for the past 2 months: Single Family Home                                      Prior Living Arrangements/Services Living arrangements for the past 2 months: Single Family Home   Patient language and need for interpreter  reviewed:: Yes Do you feel safe going back to the place where you live?: Yes      Need for Family Participation in Patient Care: Yes (Comment) Care giver support system in place?: Yes (comment) Current home services: DME (CPAP machine, Cane) Criminal Activity/Legal Involvement Pertinent to Current Situation/Hospitalization: No - Comment as needed  Activities of Daily Living Home Assistive Devices/Equipment: Cane (specify quad or straight) ADL Screening (condition at time of admission) Patient's cognitive ability adequate to safely complete daily activities?: Yes Is the patient deaf or have difficulty hearing?: No Does the patient have difficulty seeing, even when wearing glasses/contacts?: No Does the patient have difficulty concentrating, remembering, or making decisions?: No Patient able to express need for assistance with ADLs?: Yes Does the patient have difficulty dressing or bathing?: No Independently performs ADLs?: No Communication: Independent Dressing (OT): Needs assistance Grooming: Needs assistance Feeding: Independent Bathing: Needs assistance Toileting: Needs assistance In/Out Bed: Needs assistance Walks in Home: Dependent Does the patient have difficulty walking or climbing stairs?: Yes Weakness of Legs: Both Weakness of Arms/Hands: Both  Permission Sought/Granted Permission sought to share information with : Case Manager, Family Supports, Magazine features editor Permission granted to share information with : Yes, Verbal Permission Granted     Permission granted to share info w AGENCY: SNF facility  Emotional Assessment Appearance:: Appears stated age Attitude/Demeanor/Rapport: Gracious Affect (typically observed): Accepting Orientation: : Oriented to Self, Oriented to Place, Oriented to  Time, Oriented to Situation Alcohol / Substance Use: Not Applicable Psych Involvement: No (comment)  Admission diagnosis:  Hypokalemia [E87.6] SIRS (systemic  inflammatory response syndrome) (HCC) [R65.10] Cellulitis of right lower extremity [L03.115] Total bilirubin, elevated [R17] Elevated lactic acid level [R79.89] Elevated AST (SGOT) [R74.01] Sepsis (HCC) [A41.9] Sepsis, due to unspecified organism, unspecified whether acute organ dysfunction present [facility]) [A41.9] Patient Active Problem List   Diagnosis Date Noted   SIRS (systemic inflammatory response syndrome) (HCC) 11/09/2022   Sepsis (HCC) 11/09/2022   Acute metabolic encephalopathy 11/09/2022   Renal insufficiency 11/09/2022   Hypokalemia 11/09/2022   Normocytic anemia 11/09/2022   Morbid obesity with BMI of 50.0-59.9, adult (HCC) 09/14/2022   Gastric polyp 04/08/2022   Hyperlipidemia 05/20/2021   Lumbar radiculopathy 12/24/2020   Trigger little finger of left hand 12/16/2020   Medial epicondylitis of elbow, left 12/16/2020   Memory difficulties 12/11/2020   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   Globus sensation 11/15/2019   Gastroesophageal reflux disease 11/15/2019   Throat clearing 10/05/2019   Gout 08/31/2019   Phlebitis 08/31/2019   Prediabetes 08/31/2019   OA (osteoarthritis) of knee 05/17/2019   Acute pain of right knee 05/17/2019   Abdominal pain 03/31/2019   Morbid obesity (HCC) 03/29/2018   GAD (generalized anxiety disorder) 02/27/2018   Mixed incontinence urge and stress 02/27/2018   OSA on CPAP 06/16/2017   Acute appendicitis 05/06/2016   Gastro-esophageal reflux disease with esophagitis 07/26/2011   Essential hypertension 07/26/2011   Osteoarthrosis 10/16/2009   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 [place] [address] Phone: [phone] Fax: [phone]     Social Determinants of Health (SDOH) Social History: SDOH Screenings   Food Insecurity: No Food Insecurity (11/09/2022)  Housing: Patient Declined (11/09/2022)  Transportation Needs: No Transportation Needs (11/09/2022)  Utilities: Not At Risk (11/09/2022)  Alcohol Screen: Low Risk  (07/13/2022)  Depression (PHQ2-9): Low Risk  (01/21/2022)  Financial Resource Strain: Low Risk  (07/13/2022)  Physical Activity: Insufficiently Active (07/13/2022)  Social Connections: Moderately Isolated (07/13/2022)  Stress: No Stress Concern Present (07/13/2022)  Tobacco Use: Medium Risk (11/08/2022)   SDOH Interventions:     Readmission Risk Interventions     No data to display

## 2022-11-10 NOTE — Evaluation (Signed)
Occupational Therapy Evaluation Patient Details Name: Christine [name] MRN: [id number] DOB: 03/07/1951 Today's Date: 11/10/2022   History of Present Illness Christine [name] is a 72 y.o. female who presents with bilateral foot pain. Admitted for sepsis of unknown source. PMHx: HTN, HLD, CAD, gout, venous stasis, morbid obesity s/p Roux-en-Y gastric bypass, OSA on CPAP, and GERD.   Clinical Impression   Pt evaluated s/p above admission list. Pt reports modified independence with ADL/IADLs, driving and functional mobility with use of SPC within the community at baseline. Pt presents this session with generalized weakness, decreased balance and significant pain at soles of both feet. Pt currently requires setup A for seated UB ADLs and max +2 for LB ADLs. Pt completed STS transfer from elevated EOB using RW with max A +2 and pt unable to achieve full stand. Pt completed STS transfer from elevated EOB using stedy with min A +2 for dependent transfer to recliner. Pt would benefit from continued acute OT services to maximize functional independence and facilitate transition to skilled inpatient follow up therapy, <3 hours/day.       Recommendations for follow up therapy are one component of a multi-disciplinary discharge planning process, led by the attending physician.  Recommendations may be updated based on patient status, additional functional criteria and insurance authorization.   Assistance Recommended at Discharge Frequent or constant Supervision/Assistance  Patient can return home with the following Two people to help with walking and/or transfers;Two people to help with bathing/dressing/bathroom;Assistance with cooking/housework;Assist for transportation;Help with stairs or ramp for entrance    Functional Status Assessment  Patient has had a recent decline in their functional status and demonstrates the ability to make significant improvements in function in a reasonable and predictable amount  of time.  Equipment Recommendations  Other (comment) (defer)    Recommendations for Other Services       Precautions / Restrictions Precautions Precautions: Fall Restrictions Weight Bearing Restrictions: No      Mobility Bed Mobility Overal bed mobility: Needs Assistance Bed Mobility: Supine to Sit     Supine to sit: HOB elevated, Min assist     General bed mobility comments: min A to progress hips toward EOB    Transfers Overall transfer level: Needs assistance Equipment used: Rolling walker (2 wheels), Ambulation equipment used (stedy) Transfers: Sit to/from Stand, Bed to chair/wheelchair/BSC Sit to Stand: Max assist, From elevated surface, +2 safety/equipment, +2 physical assistance           General transfer comment: STS transfer from elevated EOB using RW with max A +2 and pt unable to achieve complete stand. STS transfer from elevated EOB and stedy paddles using stedy with min A +2. Dependent transfer from EOB>recliner using stedy. Transfer via Lift Equipment: Stedy    Balance Overall balance assessment: Needs assistance Sitting-balance support: No upper extremity supported, Feet supported Sitting balance-Leahy Scale: Fair Sitting balance - Comments: sitting EOB   Standing balance support: Bilateral upper extremity supported, During functional activity, Reliant on assistive device for balance Standing balance-Leahy Scale: Poor Standing balance comment: BUE support on stedt for stability                           ADL either performed or assessed with clinical judgement   ADL Overall ADL's : Needs assistance/impaired Eating/Feeding: Independent;Sitting   Grooming: Set up;Sitting   Upper Body Bathing: Set up;Sitting   Lower Body Bathing: Maximal assistance;+2 for physical assistance;+2 for safety/equipment;Sit to/from stand   Upper  Body Dressing : Set up;Sitting   Lower Body Dressing: Maximal assistance;+2 for physical assistance;+2 for  safety/equipment;Sit to/from stand   Toilet Transfer: Total assistance;BSC/3in1 (stedy) Toilet Transfer Details (indicate cue type and reason): STS transfer using stedy with min A +2 Toileting- Clothing Manipulation and Hygiene: Maximal assistance;Sit to/from stand;+2 for physical assistance;+2 for safety/equipment       Functional mobility during ADLs: Total assistance (stedy) General ADL Comments: limited secondary to generalized weakness and pain at soles of both feet     Vision Baseline Vision/History: 1 Wears glasses Ability to See in Adequate Light: 0 Adequate Vision Assessment?: No apparent visual deficits     Perception Perception Perception Tested?: No   Praxis Praxis Praxis tested?: Not tested    Pertinent Vitals/Pain Pain Assessment Pain Assessment: Faces Faces Pain Scale: Hurts whole lot Pain Location: soles of both feet Pain Descriptors / Indicators: Discomfort, Grimacing, Guarding Pain Intervention(s): Limited activity within patient's tolerance, Monitored during session     Hand Dominance Right   Extremity/Trunk Assessment Upper Extremity Assessment Upper Extremity Assessment: Generalized weakness   Lower Extremity Assessment Lower Extremity Assessment: Defer to PT evaluation   Cervical / Trunk Assessment Cervical / Trunk Assessment: Kyphotic   Communication Communication Communication: No difficulties   Cognition Arousal/Alertness: Awake/alert Behavior During Therapy: WFL for tasks assessed/performed Overall Cognitive Status: Within Functional Limits for tasks assessed                                 General Comments: A+O x4, follows commands appropriately. Limited secondary to pain at soles of feet     General Comments  VSS on RA    Exercises     Shoulder Instructions      Home Living Family/patient expects to be discharged to:: Private residence Living Arrangements: Alone Available Help at Discharge: Neighbor;Available  PRN/intermittently Type of Home: House Home Access: Stairs to enter [employer] of Steps: 4 Entrance Stairs-Rails: Can reach both Home Layout: One level     Bathroom Shower/Tub: Chief Strategy Officer: Standard     Home Equipment: Cane - single point          Prior Functioning/Environment Prior Level of Function : Independent/Modified Independent;Driving             Mobility Comments: Furniture walks within the house, uses [facility] for community mobility ADLs Comments: Mod I with ADL/IADLs, driving        OT Problem List: Decreased strength;Decreased range of motion;Decreased activity tolerance;Impaired balance (sitting and/or standing);Pain      OT Treatment/Interventions: Self-care/ADL training;Therapeutic exercise;Energy conservation;DME and/or AE instruction;Therapeutic activities;Patient/family education;Balance training    OT Goals(Current goals can be found in the care plan section) Acute Rehab OT Goals Patient Stated Goal: to get stronger OT Goal Formulation: With patient Time For Goal Achievement: 11/24/22 Potential to Achieve Goals: Good ADL Goals Pt Will Perform Grooming: standing;with min assist Pt Will Perform Lower Body Dressing: with min assist;sit to/from stand Pt Will Transfer to Toilet: with min assist;stand pivot transfer;bedside commode Pt Will Perform Toileting - Clothing Manipulation and hygiene: with min assist;sit to/from stand  OT Frequency: Min 2X/week    Co-evaluation              AM-PAC OT "6 Clicks" Daily Activity     Outcome Measure Help from another person eating meals?: None Help from another person taking care of personal grooming?: A Little Help from another person  toileting, which includes using toliet, bedpan, or urinal?: Total Help from another person bathing (including washing, rinsing, drying)?: A Lot Help from another person to put on and taking off regular upper body clothing?: A Little Help from  another person to put on and taking off regular lower body clothing?: A Lot 6 Click Score: 15   End of Session Equipment Utilized During Treatment: Gait belt;Rolling walker (2 wheels);Other (comment) (stedy) Nurse Communication: Mobility status;Need for lift equipment  Activity Tolerance: Patient limited by pain Patient left: in chair;with call bell/phone within reach  OT Visit Diagnosis: Unsteadiness on feet (R26.81);Muscle weakness (generalized) (M62.81);Pain                Time: 8413-2440 OT Time Calculation (min): 26 min Charges:  OT General Charges $OT Visit: 1 Visit OT Evaluation $OT Eval Moderate Complexity: 1 Mod OT Treatments $Therapeutic Activity: 8-22 mins  Sherley Bounds, OTS Acute Rehabilitation Services Office [phone] Secure Chat Communication Preferred   Sherley Bounds 11/10/2022, 9:49 AM

## 2022-11-10 NOTE — Progress Notes (Signed)
[facility] PROGRESS NOTE  Minh Jasper (DOB: 1950/08/12) ZOX:[id number] PCP: [name], DO  Brief Narrative: Christine [name] is a 72 y.o. female with a history of morbid obesity s/p Roux-en-Y, OSA on CPAP, HTN, HLD, CAD, gout, GERD who presented to the ED on 11/08/2022 after being found confused and incontinent at home on a welfare check. She was found to be febrile to 102.38F, with normal WBC, lactic acidosis (LA 3.2, bicarb 15), hypokalemic (K 2.9) with AKI (SCr 1.06). Blood cultures were drawn, though work up including CXR, UA, CT abd/pelvis, viral panels has not identified a nidus of infection. IV antibiotics were started, IV fluids given, and she was admitted. Mental status has improved significantly, though functional ability is impaired for which SNF placement is sought.  Subjective: Denies current or recent chest pain, dyspnea, cough, abd pain, urinary changes, rash/wounds, or chills. She was incontinent of stool and urine at home, but believes that was a conscious choice due to weakness. Has had no diarrhea here. She describes many weeks-months of bilateral foot pain that flares at times on one side or the other and abates with gout treatment. Over the past couple weeks she's noticed increased, constant, waxing-waning sharp pain throughout both feet symmetrically that is at times very bad and worse when trying to bear weight, but still present without that.   Objective: BP (!) 154/75 (BP Location: Left Arm)   Pulse 86   Temp 98.3 F (36.8 C) (Oral)   Resp 18   Ht 5\' 1"  (1.549 m)   Wt 119.6 kg   SpO2 99%   BMI 49.83 kg/m   Gen: No distress Pulm: Clear, nonlabored  CV: RRR, no MRG. Trace dependent pitting edema. GI: Soft, NT, ND, +BS, obese. Healed lap. surgical scars.  Neuro: Alert and oriented. No new focal deficits.  Ext: Warm, no deformities. No visible or palpable swelling/erythema/deformity on feet bilaterally. No wounds. Has diffuse tenderness involving  bottom of the feet worse with palpation, symmetrical.  Skin: No other rashes, lesions or ulcers on visualized skin   Assessment & Plan: SIRS: Fever, tachypnea, no source. With lactic acidosis and presentation, suspect much of presentation is related to dehydration. She is clinically much improved after IV fluids, though also started abx at that time as well. Still no infectious source identified. CRP grossly elevated at 22 but not specific.  - Plan to continue broad IV antibiotics while monitoring blood cultures.     Acute metabolic encephalopathy: Improved. Nonacute CT head. Memory difficulties noted on problem list. TSH wnl.  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  - Check vitamin levels - Start supplement if indicated. Will also start low dose gabapentin   Prediabetes: HbA1c 6.3% this year.  - Anticipate improvement after bypass.  Hypokalemia: Due to GI losses. Based on ketonuria, suspect inadequate po intake as well contributing.  - Holding HCTZ. May be able to restart.   Hypomagnesemia: Supplemented.  AKI: SCr 1.06 now improved to normal at 0.68. Prerenal etiology suspected.  - Avoid nephrotoxins    Normocytic anemia Hemoglobin 11.5 at admission, back down to baseline in 9's after correcting hemoconcentration with IVF. No bleeding.  - Monitor intermittently/PCP follow up.     Hypertension: BP elevated. - Will plan to restart triamterene-hydrochlorothiazide    Gout: Chronic. No acute flare at this time.     Elevated LFTs: Mild, improving with fluids. Suspect an element of hepatic steatosis. No causative finding on CT abd/pelvis.  Hyperlipidemia: Patient reported that Crestor had been placed on hold due to Roux-en-Y   OSA:  - CPAP qHS   Morbid obesity: s/p Roux-en-Y 09/14/2022 with Dr. Dossie Der. Body mass index is 49.83 kg/m.  - Continue outpatient follow-up with surgery - Avoid  NSAIDs.  Weakness: Has gait instability at baseline, her obesity puts her at risk for deconditioning quickly  - PT/OT consulted.   Tyrone Nine, MD [facility] [email or web address] 11/10/2022, 12:43 PM

## 2022-11-10 NOTE — Evaluation (Signed)
Physical Therapy Evaluation Patient Details Name: Christine [name] MRN: [id number] DOB: [birth year] Today's Date: 11/10/2022  History of Present Illness  Christine [name] is a 72 y.o. female who presents with bilateral foot pain. Admitted for sepsis of unknown source.  Pt reports stuck in bed 5 days.  PMHx: HTN, HLD, CAD, gout, venous stasis, morbid obesity s/p Roux-en-Y gastric bypass, OSA on CPAP, and GERD.  Clinical Impression  Pt admitted with above diagnosis. At baseline, pt is independent and lives alone.  She reports she was in bed for 5 days prior to admission.  She now requires min A for bed mobility, min A of 2 to stand into STEDY, and STEDY for pivot.  She was not able to weight shift or perform any pregait activities due to bil foot pain.  Pt is well below her baseline and will benefit from acute PT services.  Pt currently with functional limitations due to the deficits listed below (see PT Problem List). Pt will benefit from acute skilled PT to increase their independence and safety with mobility to allow discharge.  At discharge Patient will benefit from continued inpatient follow up therapy, <3 hours/day          Assistance Recommended at Discharge Frequent or constant Supervision/Assistance  If plan is discharge home, recommend the following:  Can travel by private vehicle  A lot of help with bathing/dressing/bathroom;A lot of help with walking and/or transfers   No    Equipment Recommendations Other (comment) (defer to post acute)  Recommendations for Other Services       Functional Status Assessment Patient has had a recent decline in their functional status and demonstrates the ability to make significant improvements in function in a reasonable and predictable amount of time.     Precautions / Restrictions Precautions Precautions: Fall Restrictions Weight Bearing Restrictions: No      Mobility  Bed Mobility Overal bed mobility: Needs Assistance Bed Mobility:  Sit to Supine       Sit to supine: Min assist   General bed mobility comments: Min A for legs back to bed    Transfers Overall transfer level: Needs assistance Equipment used: Ambulation equipment used Transfers: Sit to/from Stand, Bed to chair/wheelchair/BSC Sit to Stand: Min assist, +2 physical assistance           General transfer comment: Spoke with OT and pt required STEDY early.  With STEDY and use of UE to pull up pt requiring light min A of 2.  STEDY for pivot. Heavy dependence on UE Transfer via Lift Equipment: Stedy  Ambulation/Gait             Pre-gait activities: Attempted to have pt weight shift or lift heels in STEDY but too painful    Stairs            Wheelchair Mobility     Tilt Bed    Modified Rankin (Stroke Patients Only)       Balance Overall balance assessment: Needs assistance Sitting-balance support: No upper extremity supported, Feet supported Sitting balance-Leahy Scale: Good     Standing balance support: Bilateral upper extremity supported, During functional activity, Reliant on assistive device for balance Standing balance-Leahy Scale: Poor Standing balance comment: Heavy reliance on bil UE on STEDY due to foot pain                             Pertinent Vitals/Pain Pain Assessment Pain Assessment: Faces Faces Pain Scale:  Hurts whole lot Pain Location: soles of both feet Pain Descriptors / Indicators: Discomfort, Grimacing, Guarding Pain Intervention(s): Limited activity within patient's tolerance, Monitored during session, Repositioned    Home Living Family/patient expects to be discharged to:: Private residence Living Arrangements: Alone Available Help at Discharge: Neighbor;Available PRN/intermittently Type of Home: House Home Access: Stairs to enter Entrance Stairs-Rails: Can reach both;Right;Left Entrance Stairs-Number of Steps: 4   Home Layout: One level Home Equipment: Cane - single  point Additional Comments: Pt does report considering moving back to [place] with family into retirement community    Prior Function Prior Level of Function : Independent/Modified Independent;Driving             Mobility Comments: Furniture walks within the house, uses [facility] for limited community mobility ADLs Comments: Mod I with ADL/IADLs, driving, and shopping     Hand Dominance   Dominant Hand: Right    Extremity/Trunk Assessment   Upper Extremity Assessment Upper Extremity Assessment: Defer to OT evaluation    Lower Extremity Assessment Lower Extremity Assessment: RLE deficits/detail;LLE deficits/detail RLE Deficits / Details: ROM: ankles painful/tight but able to get neutral DF with standing, hip and knee WFL; MMT: ankle 1/5 painful feet, knee ext 3/5, hip flexion 3/5.  Pain - Reports pain in plantar feet and hurts with weight bearing, palpation, and ankle movement LLE Deficits / Details: ROM: ankles painful/tight but able to get neutral DF with standing, hip and knee WFL; MMT: ankle 1/5 painful feet, knee ext 3/5, hip flexion 3/5., Pain - Reports pain in plantar feet and hurts with weight bearing, palpation, and ankle movement    Cervical / Trunk Assessment Cervical / Trunk Assessment: Kyphotic  Communication   Communication: No difficulties  Cognition Arousal/Alertness: Awake/alert Behavior During Therapy: WFL for tasks assessed/performed Overall Cognitive Status: Within Functional Limits for tasks assessed                                 General Comments: A+O x4, follows commands appropriately.        General Comments General comments (skin integrity, edema, etc.): VSS    Exercises General Exercises - Lower Extremity Ankle Circles/Pumps: AAROM, Both, 10 reps, Seated (with gentle 5 sec stretch each rep) Long Arc Quad: AROM, Both, 10 reps, Seated Hip Flexion/Marching: AROM, Left, 10 reps, Seated   Assessment/Plan    PT Assessment Patient needs  continued PT services  PT Problem List Decreased strength;Decreased range of motion;Decreased knowledge of use of DME;Decreased activity tolerance;Decreased balance;Decreased knowledge of precautions;Decreased mobility;Decreased coordination;Pain       PT Treatment Interventions DME instruction;Balance training;Gait training;Functional mobility training;Patient/family education;Therapeutic activities;Therapeutic exercise;Wheelchair mobility training;Manual techniques;Neuromuscular re-education    PT Goals (Current goals can be found in the Care Plan section)  Acute Rehab PT Goals Patient Stated Goal: agrees to post acute rehab PT Goal Formulation: With patient Time For Goal Achievement: 11/24/22 Potential to Achieve Goals: Good    Frequency Min 2X/week     Co-evaluation               AM-PAC PT "6 Clicks" Mobility  Outcome Measure Help needed turning from your back to your side while in a flat bed without using bedrails?: A Little Help needed moving from lying on your back to sitting on the side of a flat bed without using bedrails?: A Little Help needed moving to and from a bed to a chair (including a wheelchair)?: Total Help needed standing  up from a chair using your arms (e.g., wheelchair or bedside chair)?: A Lot Help needed to walk in hospital room?: Total Help needed climbing 3-5 steps with a railing? : Total 6 Click Score: 11    End of Session Equipment Utilized During Treatment: Gait belt Activity Tolerance: Patient limited by pain Patient left: in bed;with call bell/phone within reach;with bed alarm set Nurse Communication: Mobility status PT Visit Diagnosis: Other abnormalities of gait and mobility (R26.89);Muscle weakness (generalized) (M62.81);Pain Pain - Right/Left:  (bil) Pain - part of body: Ankle and joints of foot    Time: 1204-1222 PT Time Calculation (min) (ACUTE ONLY): 18 min   Charges:   PT Evaluation $PT Eval Moderate Complexity: 1 Mod   PT  General Charges $$ ACUTE PT VISIT: 1 Visit         Anise Salvo, PT Acute Rehab [facility] [phone]   Rayetta Humphrey 11/10/2022, 12:34 PM

## 2022-11-10 NOTE — [place] FL2 (Signed)
[place] MEDICAID FL2 LEVEL OF CARE FORM     IDENTIFICATION  Patient Name: Christine [name] Birthdate: Mar 18, 1951 Sex: female Admission Date (Current Location): 11/08/2022  [facility] and [place] Number:  Producer, television/film/video and Address:  [facility], [address] [place], [place] [address]      Provider Number: [id number]  Attending Physician Name and Address:  Tyrone Nine, MD  Relative Name and Phone Number:       Current Level of Care: Hospital Recommended Level of Care: Skilled Nursing Facility Prior Approval Number:    Date Approved/Denied:   PASRR Number: [id number]  Discharge Plan: SNF    Current Diagnoses: Patient Active Problem List   Diagnosis Date Noted   SIRS (systemic inflammatory response syndrome) (HCC) 11/09/2022   Sepsis (HCC) 11/09/2022   Acute metabolic encephalopathy 11/09/2022   Renal insufficiency 11/09/2022   Hypokalemia 11/09/2022   Normocytic anemia 11/09/2022   Morbid obesity with BMI of 50.0-59.9, adult (HCC) 09/14/2022   Gastric polyp 04/08/2022   Hyperlipidemia 05/20/2021   Lumbar radiculopathy 12/24/2020   Trigger little finger of left hand 12/16/2020   Medial epicondylitis of elbow, left 12/16/2020   Memory difficulties 12/11/2020   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   Globus sensation 11/15/2019   Gastroesophageal reflux disease 11/15/2019   Throat clearing 10/05/2019   Gout 08/31/2019   Phlebitis 08/31/2019   Prediabetes 08/31/2019   OA (osteoarthritis) of knee 05/17/2019   Acute pain of right knee 05/17/2019   Abdominal pain 03/31/2019   Morbid obesity (HCC) 03/29/2018   GAD (generalized anxiety disorder) 02/27/2018   Mixed incontinence urge and stress 02/27/2018   OSA on CPAP 06/16/2017   Acute appendicitis 05/06/2016   Gastro-esophageal reflux disease with esophagitis 07/26/2011   Essential hypertension 07/26/2011   Osteoarthrosis 10/16/2009   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, including shoulder 08/22/2008   Pre-operative cardiovascular examination 08/22/2008   Anxiety 04/27/2003    Orientation RESPIRATION BLADDER Height & Weight     Self, Time, Situation, Place  Normal (CPAP at night - patient will bring CPAP and mask from home) External catheter Weight: 119.6 kg Height:  5\' 1"  (154.9 cm)  BEHAVIORAL SYMPTOMS/MOOD NEUROLOGICAL BOWEL NUTRITION STATUS      Continent Diet  AMBULATORY STATUS COMMUNICATION OF NEEDS Skin   Total Care Verbally Normal                       Personal Care Assistance Level of Assistance  Bathing, Feeding, Dressing Bathing Assistance: Limited assistance Feeding assistance: Independent Dressing Assistance: Limited assistance     Functional Limitations Info  Sight, Hearing, Speech Sight Info: Adequate Hearing Info: Adequate Speech Info: Adequate    SPECIAL CARE FACTORS FREQUENCY  PT (By licensed PT), OT (By licensed OT)     PT Frequency: 5 x per week OT Frequency: 5 x per week            Contractures Contractures Info: Not present    Additional Factors Info  Code Status, Allergies, Psychotropic Code Status Info: Full code Allergies Info: Prednisone, tape, sulfa Psychotropic Info: Cymbalta         Current Medications (11/10/2022):  This is the current hospital active medication list Current Facility-Administered Medications  Medication Dose Route Frequency Provider Last Rate Last Admin   acetaminophen (TYLENOL) tablet 650 mg  650 mg Oral Q6H PRN Madelyn Flavors A, MD   650 mg at  11/10/22 1610   Or   acetaminophen (TYLENOL) suppository 650 mg  650 mg Rectal Q6H PRN Madelyn Flavors A, MD       albuterol (PROVENTIL) (2.5 MG/3ML) 0.083% nebulizer solution 2.5 mg  2.5 mg Nebulization Q2H PRN Smith, Rondell A, MD       ceFEPIme (MAXIPIME) 2 g in sodium chloride 0.9 % 100 mL IVPB  2 g Intravenous Q8H Daylene Posey, RPH 200 mL/hr at 11/10/22 0517 2 g at 11/10/22 0517    DULoxetine (CYMBALTA) DR capsule 60 mg  60 mg Oral Daily Smith, Rondell A, MD   60 mg at 11/10/22 0919   enoxaparin (LOVENOX) injection 40 mg  40 mg Subcutaneous Q24H Smith, Rondell A, MD   40 mg at 11/10/22 0920   ferrous sulfate tablet 325 mg  325 mg Oral Q breakfast Madelyn Flavors A, MD   325 mg at 11/10/22 0919   ondansetron (ZOFRAN) tablet 4 mg  4 mg Oral Q6H PRN Madelyn Flavors A, MD       Or   ondansetron (ZOFRAN) injection 4 mg  4 mg Intravenous Q6H PRN Madelyn Flavors A, MD   4 mg at 11/09/22 1751   oxybutynin (DITROPAN-XL) 24 hr tablet 15 mg  15 mg Oral QHS Smith, Rondell A, MD   15 mg at 11/09/22 2109   pantoprazole (PROTONIX) EC tablet 40 mg  40 mg Oral Daily Smith, Rondell A, MD   40 mg at 11/10/22 0919   sodium chloride flush (NS) 0.9 % injection 3 mL  3 mL Intravenous Q12H Smith, Rondell A, MD   3 mL at 11/10/22 0920   triamcinolone ointment (KENALOG) 0.5 %   Topical BID PRN Madelyn Flavors A, MD       vancomycin (VANCOCIN) IVPB 1000 mg/200 mL premix  1,000 mg Intravenous Q24H Daylene Posey, RPH 200 mL/hr at 11/10/22 0303 1,000 mg at 11/10/22 0303     Discharge Medications: Please see discharge summary for a list of discharge medications.  Relevant Imaging Results:  Relevant Lab Results:   Additional Information SS# [id number]  Janae Bridgeman, RN

## 2022-11-10 NOTE — Plan of Care (Signed)

## 2022-11-11 ENCOUNTER — Inpatient Hospital Stay (HOSPITAL_COMMUNITY): Payer: Medicare HMO

## 2022-11-11 DIAGNOSIS — M7989 Other specified soft tissue disorders: Secondary | ICD-10-CM | POA: Diagnosis not present

## 2022-11-11 DIAGNOSIS — G9341 Metabolic encephalopathy: Secondary | ICD-10-CM | POA: Diagnosis not present

## 2022-11-11 MED ORDER — ADULT MULTIVITAMIN W/MINERALS CH
1.0000 | ORAL_TABLET | Freq: Every day | ORAL | Status: DC
  Administered 2022-11-11 – 2022-11-15 (×5): 1 via ORAL
  Filled 2022-11-11 (×4): qty 1

## 2022-11-11 MED ORDER — FOLIC ACID 1 MG PO TABS
1.0000 mg | ORAL_TABLET | Freq: Every day | ORAL | Status: DC
  Administered 2022-11-11 – 2022-11-15 (×5): 1 mg via ORAL
  Filled 2022-11-11 (×5): qty 1

## 2022-11-11 NOTE — Progress Notes (Signed)
[facility] PROGRESS NOTE  Christine [name] (DOB: 20-Jul-1950) TKZ:[id number] PCP: [name], DO  Brief Narrative: Christine [name] is a 72 y.o. female with a history of morbid obesity s/p Roux-en-Y, OSA on CPAP, HTN, HLD, CAD, gout, GERD who presented to the ED on 11/08/2022 after being found confused and incontinent at home on a welfare check. She was found to be febrile to 102.49F, with normal WBC, lactic acidosis (LA 3.2, bicarb 15), hypokalemic (K 2.9) with AKI (SCr 1.06). Blood cultures were drawn, though work up including CXR, UA, CT abd/pelvis, viral panels has not identified a nidus of infection. IV antibiotics were started, IV fluids given, and she was admitted. Mental status has normalized, though functional ability is impaired for which SNF placement is sought.  Subjective: "Feeling pretty good today." Pain in feet is unchanged. Called and spoke with her sister by phone.  Objective: BP (!) 146/76 (BP Location: Left Arm)   Pulse (!) 108   Temp 98.4 F (36.9 C) (Oral)   Resp 16   Ht 5\' 1"  (1.549 m)   Wt 119.2 kg   SpO2 100%   BMI 49.65 kg/m   Gen: No distress Pulm: Clear, distant, nonlabored  CV: RRR, no MRG. Dependent edema GI: Soft, NT, ND, +BS  Neuro: Alert and oriented. No new focal deficits. Ext: Warm, no deformities. Tenderness to calf compression R > L. Negative homan's.  Skin: Stasis changes LE's, no new rashes, lesions or ulcers on visualized skin   Assessment & Plan: SIRS: Fever, tachypnea, no source. With lactic acidosis and presentation, suspect much of presentation is related to dehydration. She is clinically much improved after IV fluids, though also started abx at that time as well. Still no infectious source identified. CRP grossly elevated at 22 but not specific.  - Continue cefepime. DC vancomycin w/no evidence of SSTI and negative MRSA PCR. - Monitor blood cultures (NGTD but will turn 48 hours overnight tonight)   Acute metabolic  encephalopathy: Improved. Nonacute CT head. Memory difficulties noted on problem list. TSH wnl.  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  - Supp folic acid as below - Continue (newly started) low dose gabapentin   Folic acid deficiency:  - Supplement in addition to initiating multivitamin/mineral.   LE edema, tender on R today not present yesterday. At risk for DVT.  - R/o w/venous U/S  Prediabetes: HbA1c 6.3% this year.  - Anticipate improvement after bypass.  Hypokalemia: Due to GI losses. Based on ketonuria, suspect inadequate po intake as well contributing.  - Resolved.  Hypomagnesemia: Supplemented.  AKI: SCr 1.06 now improved to normal at 0.68. Prerenal etiology suspected.  - Avoid nephrotoxins    Normocytic anemia: Hemoglobin 11.5 at admission, back down to baseline in 9's after correcting hemoconcentration with IVF. No bleeding.  - Monitor intermittently/PCP follow up.     Hypertension: BP elevated. - Continue home triamterene-hydrochlorothiazide    Gout: Chronic. No acute flare at this time.     Elevated LFTs: Mild, improving with fluids. Suspect an element of hepatic steatosis. No causative finding on CT abd/pelvis.    Hyperlipidemia: Patient reported that Crestor had been placed on hold due to Roux-en-Y   OSA:  - CPAP qHS   Morbid obesity: s/p Roux-en-Y 09/14/2022 with Dr. Dossie Der. Body mass index is 49.65 kg/m.  - Continue outpatient follow-up with surgery - Avoid NSAIDs.  Weakness: Has gait instability at baseline, her obesity puts her at risk for deconditioning  quickly  - PT/OT consulted.   Tyrone Nine, MD [facility] [email or web address] 11/11/2022, 11:41 AM

## 2022-11-11 NOTE — Plan of Care (Signed)

## 2022-11-11 NOTE — Care Management Important Message (Signed)
Important Message  Patient Details  Name: Christine [name] MRN: [id number] Date of Birth: [birth year]   Medicare Important Message Given:  Yes     Dorena Bodo 11/11/2022, 2:24 PM

## 2022-11-11 NOTE — Progress Notes (Signed)
Bilateral lower extremity venous study completed.   Preliminary results relayed to MD and RN.  Please see CV Procedures for preliminary results.  Favian Kittleson, RVT  3:17 PM 11/11/22

## 2022-11-11 NOTE — Consult Note (Signed)
   [facility] CM Inpatient Consult   11/11/2022  Christine [name] 1950/05/09 [id number]  [facility] [[facility]]  Accountable Care Organization [ACO] Patient: Christine [name] Medicare  Primary Care Provider:  [name], DO with [place] [facility]   Patient screened for hospitalization with noted medium risk score for unplanned readmission risk and to assess for potential [facility]  [[facility]] Care Management service needs for post hospital transition for care coordination.  Review of patient's electronic medical record reveals patient is from home alone.  Patient was found by [employer] well check request at home.  Spoke with patient  via phone and endorses PCP. Also spoke regarding needs and disposition.  Patient states she is not at her baseline physically. Patient's HIPAA was verified.  Patient states her current phone is non-working and her brother is getting her another phone soon.  Verified her sister can be contacted Christine [name],Christine [name] at [phone] in the meantime. Reviewed fr disposition needs and if patient receives insurance auth and offers for SNF at a [facility] affiliated facility can have the [facility] RN to follow before returning to community.  Plan:  Patient's current disposition is being recommended for a skilled nursing facility level of care for rehab. Will follow progress and disposition.  Of note, [facility] Care Management/Population Health does not replace or interfere with any arrangements made by the Inpatient Transition of Care team.  For questions contact:   Charlesetta Shanks, RN BSN CCM [facility]  [phone] business mobile phone Toll free office [phone]  *Concierge Line  [phone] Fax number: [phone] [email or web address]

## 2022-11-12 DIAGNOSIS — D649 Anemia, unspecified: Secondary | ICD-10-CM | POA: Diagnosis not present

## 2022-11-12 DIAGNOSIS — R6510 Systemic inflammatory response syndrome (SIRS) of non-infectious origin without acute organ dysfunction: Secondary | ICD-10-CM | POA: Diagnosis not present

## 2022-11-12 DIAGNOSIS — G9341 Metabolic encephalopathy: Secondary | ICD-10-CM | POA: Diagnosis not present

## 2022-11-12 DIAGNOSIS — I10 Essential (primary) hypertension: Secondary | ICD-10-CM | POA: Diagnosis not present

## 2022-11-12 DIAGNOSIS — K219 Gastro-esophageal reflux disease without esophagitis: Secondary | ICD-10-CM

## 2022-11-12 LAB — CULTURE, BLOOD (ROUTINE X 2)
Culture: NO GROWTH
Special Requests: ADEQUATE

## 2022-11-12 NOTE — Progress Notes (Signed)
Occupational Therapy Treatment Patient Details Name: Christine [name] MRN: [id number] DOB: 1951/02/28 Today's Date: 11/12/2022   History of present illness Christine [name] is a 72 y.o. female who presents with bilateral foot pain. Admitted for sepsis of unknown source.  Pt reports stuck in bed 5 days.  PMHx: HTN, HLD, CAD, gout, venous stasis, morbid obesity s/p Roux-en-Y gastric bypass, OSA on CPAP, and GERD.   OT comments  Patient on bed pan upon arrival. Patient able to roll to remove pan and perform peri care at bed level. Patient able to get to EOB with min assist and when attempting to stand from EOB to RW patient was unable to clear bed. Nursing tech assist with sit to stand with mod assist +2 and mod assist of one for step pivot transfer. Grooming and LB dressing addressed seated in recliner. Patient  demonstrating gains with bed mobility and transfers. Patient will benefit from continued inpatient follow up therapy, <3 hours/day to address bathing, dressing, and toilet transfers.    Recommendations for follow up therapy are one component of a multi-disciplinary discharge planning process, led by the attending physician.  Recommendations may be updated based on patient status, additional functional criteria and insurance authorization.    Assistance Recommended at Discharge Frequent or constant Supervision/Assistance  Patient can return home with the following  Two people to help with walking and/or transfers;Two people to help with bathing/dressing/bathroom;Assistance with cooking/housework;Assist for transportation;Help with stairs or ramp for entrance   Equipment Recommendations  Other (comment) (defer)    Recommendations for Other Services      Precautions / Restrictions Precautions Precautions: Fall Restrictions Weight Bearing Restrictions: No       Mobility Bed Mobility Overal bed mobility: Needs Assistance Bed Mobility: Sit to Supine, Rolling Rolling: Supervision    Supine to sit: HOB elevated, Min assist     General bed mobility comments: min assist for trunk and assistance to scoot to EOB    Transfers Overall transfer level: Needs assistance Equipment used: Rolling walker (2 wheels) Transfers: Sit to/from Stand, Bed to chair/wheelchair/BSC Sit to Stand: Mod assist, +2 physical assistance     Step pivot transfers: Mod assist     General transfer comment: attempted to stand from EOB with assistance of one and patient unable to clear bed. Mod assist +2 to power up from EOB and mod assist for step pivot transfer with RW     Balance Overall balance assessment: Needs assistance Sitting-balance support: No upper extremity supported, Feet supported Sitting balance-Leahy Scale: Good Sitting balance - Comments: sitting EOB   Standing balance support: Bilateral upper extremity supported, During functional activity, Reliant on assistive device for balance Standing balance-Leahy Scale: Poor Standing balance comment: reliant on RW for transfer                           ADL either performed or assessed with clinical judgement   ADL Overall ADL's : Needs assistance/impaired     Grooming: Wash/dry hands;Wash/dry face;Oral care;Set up;Sitting Grooming Details (indicate cue type and reason): in recliner             Lower Body Dressing: Maximal assistance Lower Body Dressing Details (indicate cue type and reason): to doff and donn socks Toilet Transfer: Moderate assistance;+2 for physical assistance Toilet Transfer Details (indicate cue type and reason): assistance of 2 to power up from EOB and mod assist of one for transfer; simulated to recliner Toileting- Clothing Manipulation and Hygiene: Set up;Bed  level Toileting - Clothing Manipulation Details (indicate cue type and reason): able to wash peri area after coming off bed pan            Extremity/Trunk Assessment              Vision       Perception     Praxis       Cognition Arousal/Alertness: Awake/alert Behavior During Therapy: WFL for tasks assessed/performed Overall Cognitive Status: Within Functional Limits for tasks assessed                                 General Comments: pleasant and alert and oriented        Exercises      Shoulder Instructions       General Comments      Pertinent Vitals/ Pain       Pain Assessment Pain Assessment: Faces Faces Pain Scale: Hurts a little bit Pain Location: generalized with mobility Pain Descriptors / Indicators: Grimacing, Guarding Pain Intervention(s): Limited activity within patient's tolerance, Monitored during session  Home Living                                          Prior Functioning/Environment              Frequency  Min 2X/week        Progress Toward Goals  OT Goals(current goals can now be found in the care plan section)  Progress towards OT goals: Progressing toward goals  Acute Rehab OT Goals Patient Stated Goal: get stronge enough to move to [place] OT Goal Formulation: With patient Time For Goal Achievement: 11/24/22 Potential to Achieve Goals: Good ADL Goals Pt Will Perform Grooming: standing;with min assist Pt Will Perform Lower Body Dressing: with min assist;sit to/from stand Pt Will Transfer to Toilet: with min assist;stand pivot transfer;bedside commode Pt Will Perform Toileting - Clothing Manipulation and hygiene: with min assist;sit to/from stand  Plan Discharge plan remains appropriate    Co-evaluation                 AM-PAC OT "6 Clicks" Daily Activity     Outcome Measure   Help from another person eating meals?: None Help from another person taking care of personal grooming?: A Little Help from another person toileting, which includes using toliet, bedpan, or urinal?: Total Help from another person bathing (including washing, rinsing, drying)?: A Lot Help from another person to put on and taking  off regular upper body clothing?: A Little Help from another person to put on and taking off regular lower body clothing?: A Lot 6 Click Score: 15    End of Session Equipment Utilized During Treatment: Gait belt;Rolling walker (2 wheels)  OT Visit Diagnosis: Unsteadiness on feet (R26.81);Muscle weakness (generalized) (M62.81);Pain   Activity Tolerance Patient tolerated treatment well   Patient Left in chair;with call bell/phone within reach;with chair alarm set   Nurse Communication Mobility status;Other (comment) (need for 2 people for sit to stand)        Time: 4540-9811 OT Time Calculation (min): 34 min  Charges: OT General Charges $OT Visit: 1 Visit OT Treatments $Self Care/Home Management : 8-22 mins $Therapeutic Activity: 8-22 mins  Alfonse Flavors, OTA Acute Rehabilitation Services  Office [phone]   Dewain Penning 11/12/2022, 11:31 AM

## 2022-11-12 NOTE — TOC Progression Note (Signed)
Transition of Care [facility]) - Progression Note    Patient Details  Name: Christine [name] MRN: [id number] Date of Birth: 1950/08/30  [facility]) CM/SW Contact  Marianela Mandrell A [place], [place] Phone Number: 11/12/2022, 12:10 PM  Clinical Narrative:     CSW met with pt at bedside to discuss SNF selection. Pt chose [facility]. CSW will follow up with facility regarding bed availability.  Auth to be started as pt is nearing medical stability.    Expected Discharge Plan: Skilled Nursing Facility Barriers to Discharge: Continued Medical Work up  Expected Discharge Plan and Services   Discharge Planning Services: CM Consult Post Acute Care Choice: Skilled Nursing Facility Living arrangements for the past 2 months: Single Family Home                                       Social Determinants of Health (SDOH) Interventions SDOH Screenings   Food Insecurity: No Food Insecurity (11/09/2022)  Housing: Patient Declined (11/09/2022)  Transportation Needs: No Transportation Needs (11/09/2022)  Utilities: Not At Risk (11/09/2022)  Alcohol Screen: Low Risk  (07/13/2022)  Depression (PHQ2-9): Low Risk  (01/21/2022)  Financial Resource Strain: Low Risk  (07/13/2022)  Physical Activity: Insufficiently Active (07/13/2022)  Social Connections: Moderately Isolated (07/13/2022)  Stress: No Stress Concern Present (07/13/2022)  Tobacco Use: Medium Risk (11/08/2022)    Readmission Risk Interventions     No data to display

## 2022-11-12 NOTE — Progress Notes (Signed)
[facility] PROGRESS NOTE  Christine [name] (DOB: 09-Mar-1951) WUJ:[id number] PCP: [name], DO  Brief Narrative: Christine [name] is a 72 y.o. female with a history of morbid obesity s/p Roux-en-Y, OSA on CPAP, HTN, HLD, CAD, gout, GERD who presented to the ED on 11/08/2022 after being found confused and incontinent at home on a welfare check. She was found to be febrile to 102.63F, with normal WBC, lactic acidosis (LA 3.2, bicarb 15), hypokalemic (K 2.9) with AKI (SCr 1.06). Blood cultures were drawn, though work up including CXR, UA, CT abd/pelvis, viral panels has not identified a nidus of infection. IV antibiotics were started, IV fluids given, and she was admitted. Mental status has normalized, though functional ability is impaired for which SNF placement is sought.  Subjective: No acute issues/events overnight. Sitting up at bedside enjoying lunch - complaining of foot pain bilaterally but excited she was able to stand today.  Objective: BP 136/76 (BP Location: Left Arm)   Pulse 76   Temp 98.3 F (36.8 C) (Oral)   Resp 17   Ht 5\' 1"  (1.549 m)   Wt 116.1 kg   SpO2 100%   BMI 48.37 kg/m   Gen: No distress Pulm: Clear, distant, nonlabored  CV: RRR, no MRG. Dependent edema GI: Soft, NT, ND, +BS  Neuro: Alert and oriented. No new focal deficits. Ext: Warm, no deformities. Tenderness to calf compression bilaterally - equal Skin: Stasis changes LE's at the distal lower extremities bilaterally; no new rashes, lesions or ulcers on visualized skin   Assessment & Plan:  SIRS: Fever, tachypnea, no source Likely reactive in the setting of dehydration. With lactic acidosis - resolved Likely due to profound dehydration - improved with fluids Will hold further antibiotics and follow along for fevers/symptoms CRP elevated at 22 - unclear etiology Cultures remain negative to date   Acute metabolic encephalopathy: Improved. Nonacute CT head. Memory difficulties noted on  problem list. TSH wnl.  - Delirium precautions   AKI: SCr 1.06 now improved to normal at 0.68. Prerenal etiology suspected given above.   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  - Continue folic acid supplementation, gabapentin  LE edema dependent DVT study negative, likely near baseline  Prediabetes: HbA1c 6.3% Anticipate improvement after bypass/improved diet regimen.  Hypokalemia: Likely secondary to GI losses and poor PO intake. Hypomagnesemia: Supplemented.   Chronic normocytic anemia: Hemoconcentrated at 11.5 at admission, back down to baseline around 9 after fluid resuscitation/correcting hemoconcentration    Hypertension: moderately well controlled. Continue home triamterene-hydrochlorothiazide  Gout: Chronic. No acute flare at this time.   Elevated LFTs: CT without clear etiology - likely secondary to hypovolemia vs underlying suspected hepatic steatosis  Hyperlipidemia: Patient reported that Crestor had been placed on hold due to Roux-en-Y OSA:  CPAP qHS Morbid obesity: s/p Roux-en-Y 09/14/2022 with Dr. Dossie Der. Body mass index is 48.37 kg/m.   Ambulatory dysfunction, acute on chronic: - Has gait instability at baseline due to obesity, exercise intolerance, and leg pain. - High risk for decompensation - PT/OT consulted.   Azucena Fallen, DO [facility] [email or web address] 11/12/2022, 8:15 AM

## 2022-11-12 NOTE — Progress Notes (Signed)
Physical Therapy Treatment Patient Details Name: Christine [name] MRN: [id number] DOB: 1950/12/05 Today's Date: 11/12/2022   History of Present Illness Christine [name] is a 72 y.o. female who presents with bilateral foot pain. Admitted for sepsis of unknown source.  Pt reports stuck in bed 5 days.  PMHx: HTN, HLD, CAD, gout, venous stasis, morbid obesity s/p Roux-en-Y gastric bypass, OSA on CPAP, and GERD.    PT Comments  Pt greeted up in chair and agreeable to session with good progress towards acute goals. Pt able to come to stand x2 during session to RW with min A to power up and steady on rise. Pt benefiting from cues for rocking technique to increase anterior translation. Pt able to step pivot chair>EOB with min A to steady. Pt with noted increased standing tolerance this session with light cues for upright trunk and forward gaze. Pt pleased with progress and ability to transfer with +1 assist and RW. Current plan remains appropriate to address deficits and maximize functional independence and decrease caregiver burden. Pt continues to benefit from skilled PT services to progress toward functional mobility goals.      Assistance Recommended at Discharge Frequent or constant Supervision/Assistance  If plan is discharge home, recommend the following:  Can travel by private vehicle    A lot of help with bathing/dressing/bathroom;A lot of help with walking and/or transfers   No  Equipment Recommendations  Other (comment)    Recommendations for Other Services       Precautions / Restrictions Precautions Precautions: Fall Restrictions Weight Bearing Restrictions: No     Mobility  Bed Mobility Overal bed mobility: Needs Assistance Bed Mobility: Sit to Supine       Sit to supine: Min assist   General bed mobility comments: min A to return LE to bed and reposition    Transfers Overall transfer level: Needs assistance Equipment used: Rolling walker (2 wheels) Transfers: Sit  to/from Stand, Bed to chair/wheelchair/BSC Sit to Stand: Min assist   Step pivot transfers: Min assist       General transfer comment: min A to rise from recliner and EOB, min A to steady and guide hips during step pivot trasnfers    Ambulation/Gait               General Gait Details: unable   Stairs             Wheelchair Mobility     Tilt Bed    Modified Rankin (Stroke Patients Only)       Balance Overall balance assessment: Needs assistance Sitting-balance support: No upper extremity supported, Feet supported Sitting balance-Leahy Scale: Good Sitting balance - Comments: sitting EOB   Standing balance support: Bilateral upper extremity supported, During functional activity, Reliant on assistive device for balance Standing balance-Leahy Scale: Poor Standing balance comment: reliant on RW for transfer                            Cognition Arousal/Alertness: Awake/alert Behavior During Therapy: WFL for tasks assessed/performed Overall Cognitive Status: Within Functional Limits for tasks assessed                                 General Comments: pleasant and alert and oriented        Exercises General Exercises - Lower Extremity Hip Flexion/Marching: AROM, Right, Left, 10 reps, Seated    General Comments General comments (  skin integrity, edema, etc.): VSS on RA      Pertinent Vitals/Pain Pain Assessment Pain Assessment: Faces Faces Pain Scale: Hurts a little bit Pain Location: generalized with mobility Pain Descriptors / Indicators: Grimacing, Guarding Pain Intervention(s): Monitored during session, Limited activity within patient's tolerance    Home Living                          Prior Function            PT Goals (current goals can now be found in the care plan section) Acute Rehab PT Goals PT Goal Formulation: With patient Time For Goal Achievement: 11/24/22 Progress towards PT goals:  Progressing toward goals    Frequency    Min 2X/week      PT Plan Current plan remains appropriate    Co-evaluation              AM-PAC PT "6 Clicks" Mobility   Outcome Measure  Help needed turning from your back to your side while in a flat bed without using bedrails?: A Little Help needed moving from lying on your back to sitting on the side of a flat bed without using bedrails?: A Little Help needed moving to and from a bed to a chair (including a wheelchair)?: A Lot Help needed standing up from a chair using your arms (e.g., wheelchair or bedside chair)?: A Lot Help needed to walk in hospital room?: Total Help needed climbing 3-5 steps with a railing? : Total 6 Click Score: 12    End of Session   Activity Tolerance: Patient tolerated treatment well Patient left: in bed;with call bell/phone within reach;with bed alarm set Nurse Communication: Mobility status PT Visit Diagnosis: Other abnormalities of gait and mobility (R26.89);Muscle weakness (generalized) (M62.81);Pain Pain - part of body: Ankle and joints of foot     Time: 8756-4332 PT Time Calculation (min) (ACUTE ONLY): 19 min  Charges:    $Therapeutic Activity: 8-22 mins PT General Charges $$ ACUTE PT VISIT: 1 Visit                     Wilma Wuthrich R. PTA Acute Rehabilitation Services Office: [phone]   Catalina Antigua 11/12/2022, 2:03 PM

## 2022-11-12 NOTE — TOC Progression Note (Addendum)
Transition of Care [facility]) - Progression Note    Patient Details  Name: Christine [name] MRN: [id number] Date of Birth: 26-Feb-1951  Transition of Care [facility] CM/SW Contact  Breeanna Galgano A [place], [place] Phone Number: 11/12/2022, 4:40 PM  Clinical Narrative:     CSW completed insurance authorization.  Certification # [id number].  Status Approved.   Please contact Shanna for weekend discharge: [phone]. Bed is  available.    TOC will continue to follow.   Expected Discharge Plan: Skilled Nursing Facility Barriers to Discharge: Continued Medical Work up  Expected Discharge Plan and Services   Discharge Planning Services: CM Consult Post Acute Care Choice: Skilled Nursing Facility Living arrangements for the past 2 months: Single Family Home                                       Social Determinants of Health (SDOH) Interventions SDOH Screenings   Food Insecurity: No Food Insecurity (11/09/2022)  Housing: Patient Declined (11/09/2022)  Transportation Needs: No Transportation Needs (11/09/2022)  Utilities: Not At Risk (11/09/2022)  Alcohol Screen: Low Risk  (07/13/2022)  Depression (PHQ2-9): Low Risk  (01/21/2022)  Financial Resource Strain: Low Risk  (07/13/2022)  Physical Activity: Insufficiently Active (07/13/2022)  Social Connections: Moderately Isolated (07/13/2022)  Stress: No Stress Concern Present (07/13/2022)  Tobacco Use: Medium Risk (11/08/2022)    Readmission Risk Interventions     No data to display

## 2022-11-12 NOTE — Plan of Care (Signed)

## 2022-11-12 NOTE — Progress Notes (Signed)
   11/12/22 2258  BiPAP/CPAP/SIPAP  BiPAP/CPAP/SIPAP Pt Type Adult  BiPAP/CPAP/SIPAP Resmed  Mask Type Nasal mask  Mask Size Medium  Respiratory Rate 18 breaths/min  FiO2 (%) 21 %  Patient Home Equipment No  Auto Titrate Yes  CPAP/SIPAP surface wiped down Yes  BiPAP/CPAP /SiPAP Vitals  Pulse Rate 85  Resp 18  SpO2 100 %  Bilateral Breath Sounds Clear;Diminished

## 2022-11-13 DIAGNOSIS — E876 Hypokalemia: Secondary | ICD-10-CM | POA: Diagnosis not present

## 2022-11-13 DIAGNOSIS — M109 Gout, unspecified: Secondary | ICD-10-CM | POA: Diagnosis not present

## 2022-11-13 DIAGNOSIS — N289 Disorder of kidney and ureter, unspecified: Secondary | ICD-10-CM | POA: Diagnosis not present

## 2022-11-13 DIAGNOSIS — G9341 Metabolic encephalopathy: Secondary | ICD-10-CM | POA: Diagnosis not present

## 2022-11-13 LAB — CULTURE, BLOOD (ROUTINE X 2)

## 2022-11-13 NOTE — Progress Notes (Signed)
[facility] PROGRESS NOTE  Christine [name] (DOB: 1951-02-19) NWG:[id number] PCP: [name], DO  Brief Narrative: Christine [name] is a 72 y.o. female with a history of morbid obesity s/p Roux-en-Y, OSA on CPAP, HTN, HLD, CAD, gout, GERD who presented to the ED on 11/08/2022 after being found confused and incontinent at home on a welfare check. She was found to be febrile to 102.15F, with normal WBC, lactic acidosis (LA 3.2, bicarb 15), hypokalemic (K 2.9) with AKI (SCr 1.06). Blood cultures were drawn, though work up including CXR, UA, CT abd/pelvis, viral panels has not identified a nidus of infection. IV antibiotics were started, IV fluids given, and she was admitted. Mental status has normalized, though functional ability is impaired for which SNF placement is sought.  Subjective: No acute issues/events overnight.  Patient excited she is able to ambulate across the room today without much support.  Objective: BP (!) 142/67 (BP Location: Left Arm)   Pulse 70   Temp 97.9 F (36.6 C) (Oral)   Resp 18   Ht 5\' 1"  (1.549 m)   Wt 116.2 kg   SpO2 96%   BMI 48.40 kg/m   Gen: No distress Pulm: Clear, distant, nonlabored  CV: RRR, no MRG. Dependent edema GI: Soft, NT, ND, +BS  Neuro: Alert and oriented. No new focal deficits. Ext: Warm, no deformities. Tenderness to calf compression bilaterally - equal Skin: Chronic stasis changes LE's at the distal lower extremities bilaterally; no new rashes, lesions or ulcers on visualized skin   Assessment & Plan:  SIRS: Fever, tachypnea, no source Likely reactive in the setting of dehydration. With lactic acidosis - resolved Likely due to profound dehydration - improved with fluids Will hold further antibiotics and follow along for fevers/symptoms CRP elevated at 22 - unclear etiology Cultures remain negative to date   Acute metabolic encephalopathy: Improved. Nonacute CT head. Memory difficulties noted on problem list. TSH wnl.   - Delirium precautions   AKI: SCr 1.06 now improved to baseline. Prerenal etiology suspected given above.   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  - Continue folic acid supplementation, gabapentin  LE edema dependent DVT study negative, likely near baseline  Prediabetes: HbA1c 6.3% Anticipate improvement after bypass/improved diet regimen.  Hypokalemia: Likely secondary to GI losses and poor PO intake. Hypomagnesemia: Supplemented.   Chronic normocytic anemia: Hemoconcentrated at 11.5 at admission, back down to baseline around 9 after fluid resuscitation/correcting hemoconcentration    Hypertension: moderately well controlled. Continue home triamterene-hydrochlorothiazide  Gout: Chronic. No acute flare at this time.   Elevated LFTs: CT without clear etiology - likely secondary to hypovolemia vs underlying suspected hepatic steatosis  Hyperlipidemia: Patient reported that Crestor had been placed on hold due to Roux-en-Y OSA:  CPAP qHS Morbid obesity: s/p Roux-en-Y 09/14/2022 with Dr. Dossie Der. Body mass index is 48.4 kg/m.   Ambulatory dysfunction, acute on chronic: - Has gait instability at baseline due to obesity, exercise intolerance, and leg pain. - High risk for decompensation - PT/OT consulted.   Azucena Fallen, DO [facility] [email or web address] 11/13/2022, 7:15 AM

## 2022-11-13 NOTE — Progress Notes (Signed)
Auto CPAP 5-15 not available  11/13/22 2230  BiPAP/CPAP/SIPAP  BiPAP/CPAP/SIPAP Pt Type Adult  BiPAP/CPAP/SIPAP Resmed  Mask Type Nasal mask  IPAP  (max 15)  EPAP  (min 9)  Pressure Support 2 cmH20  FiO2 (%) 21 %  BiPAP/CPAP /SiPAP Vitals  Pulse Rate 86  Resp 18  SpO2 100 %  Bilateral Breath Sounds Clear

## 2022-11-13 NOTE — Plan of Care (Signed)

## 2022-11-14 DIAGNOSIS — G9341 Metabolic encephalopathy: Secondary | ICD-10-CM | POA: Diagnosis not present

## 2022-11-14 DIAGNOSIS — E876 Hypokalemia: Secondary | ICD-10-CM | POA: Diagnosis not present

## 2022-11-14 DIAGNOSIS — A419 Sepsis, unspecified organism: Secondary | ICD-10-CM | POA: Diagnosis not present

## 2022-11-14 DIAGNOSIS — N289 Disorder of kidney and ureter, unspecified: Secondary | ICD-10-CM | POA: Diagnosis not present

## 2022-11-14 LAB — CULTURE, BLOOD (ROUTINE X 2)
Culture: NO GROWTH
Special Requests: ADEQUATE

## 2022-11-14 NOTE — Progress Notes (Signed)
[facility] PROGRESS NOTE  Dalasia Predmore (DOB: 1951/04/08) GLO:[id number] PCP: [name], DO  Brief Narrative: Christine [name] is a 72 y.o. female with a history of morbid obesity s/p Roux-en-Y, OSA on CPAP, HTN, HLD, CAD, gout, GERD who presented to the ED on 11/08/2022 after being found confused and incontinent at home on a welfare check. She was found to be febrile to 102.57F, with normal WBC, lactic acidosis (LA 3.2, bicarb 15), hypokalemic (K 2.9) with AKI (SCr 1.06). Blood cultures were drawn, though work up including CXR, UA, CT abd/pelvis, viral panels has not identified a nidus of infection. IV antibiotics were started, IV fluids given, and she was admitted. Mental status has normalized, though functional ability is impaired for which SNF placement is sought.  Subjective: No acute issues/events overnight.  Patient excited she is able to ambulate to the bathroom today without much support.  Objective: BP 137/72 (BP Location: Left Arm)   Pulse 78   Temp (!) 97.5 F (36.4 C) (Oral)   Resp 18   Ht 5\' 1"  (1.549 m)   Wt 114.6 kg   SpO2 100%   BMI 47.74 kg/m   Gen: No distress Pulm: Clear, distant, nonlabored  CV: RRR, no MRG. Dependent edema GI: Soft, NT, ND, +BS  Neuro: Alert and oriented. No new focal deficits. Ext: Warm, no deformities. Tenderness to calf compression bilaterally - equal Skin: Chronic stasis changes LE's at the distal lower extremities bilaterally; no new rashes, lesions or ulcers on visualized skin   Assessment & Plan:  SIRS: Fever, tachypnea, no source Likely reactive in the setting of dehydration. With lactic acidosis - resolved Likely due to profound dehydration - improved with fluids Will hold further antibiotics and follow along for fevers/symptoms CRP elevated at 22 - unclear etiology Cultures remain negative to date   Acute metabolic encephalopathy: Resolved. Nonacute CT head. Memory difficulties noted on problem list. TSH wnl.   - Delirium precautions   AKI: SCr 1.06 now improved to baseline. Prerenal etiology suspected given above.   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  - Continue folic acid supplementation, gabapentin  LE edema dependent - DVT study negative, likely near baseline  Prediabetes: HbA1c 6.3% - Anticipate improvement after bypass/improved diet regimen.  Hypokalemia: Likely secondary to GI losses and poor PO intake. Hypomagnesemia: Supplemented.   Chronic normocytic anemia: Hemoconcentrated at 11.5 at admission, back down to baseline around 9 after fluid resuscitation/correcting hemoconcentration    Hypertension: moderately well controlled. Continue home triamterene-hydrochlorothiazide  Gout: Chronic. No acute flare at this time.   Elevated LFTs: CT without clear etiology - likely secondary to hypovolemia vs underlying suspected hepatic steatosis  Hyperlipidemia: Patient reported that Crestor had been placed on hold due to Roux-en-Y OSA:  CPAP qHS Morbid obesity: s/p Roux-en-Y 09/14/2022 with Dr. Dossie Der. Body mass index is 47.74 kg/m.   Ambulatory dysfunction, acute on chronic: - Has gait instability at baseline due to obesity, exercise intolerance, and leg pain. - High risk for decompensation - PT/OT consulted.   Azucena Fallen, DO [facility] [email or web address] 11/14/2022, 7:17 AM

## 2022-11-14 NOTE — Plan of Care (Signed)

## 2022-11-15 DIAGNOSIS — R41841 Cognitive communication deficit: Secondary | ICD-10-CM | POA: Diagnosis not present

## 2022-11-15 DIAGNOSIS — Z7401 Bed confinement status: Secondary | ICD-10-CM | POA: Diagnosis not present

## 2022-11-15 DIAGNOSIS — E785 Hyperlipidemia, unspecified: Secondary | ICD-10-CM | POA: Diagnosis not present

## 2022-11-15 DIAGNOSIS — F339 Major depressive disorder, recurrent, unspecified: Secondary | ICD-10-CM | POA: Diagnosis not present

## 2022-11-15 DIAGNOSIS — G9009 Other idiopathic peripheral autonomic neuropathy: Secondary | ICD-10-CM | POA: Diagnosis not present

## 2022-11-15 DIAGNOSIS — R5381 Other malaise: Secondary | ICD-10-CM | POA: Diagnosis not present

## 2022-11-15 DIAGNOSIS — M6281 Muscle weakness (generalized): Secondary | ICD-10-CM | POA: Diagnosis not present

## 2022-11-15 DIAGNOSIS — Z6841 Body Mass Index (BMI) 40.0 and over, adult: Secondary | ICD-10-CM | POA: Diagnosis not present

## 2022-11-15 DIAGNOSIS — E876 Hypokalemia: Secondary | ICD-10-CM | POA: Diagnosis not present

## 2022-11-15 DIAGNOSIS — R262 Difficulty in walking, not elsewhere classified: Secondary | ICD-10-CM | POA: Diagnosis not present

## 2022-11-15 DIAGNOSIS — I10 Essential (primary) hypertension: Secondary | ICD-10-CM | POA: Diagnosis not present

## 2022-11-15 DIAGNOSIS — R6510 Systemic inflammatory response syndrome (SIRS) of non-infectious origin without acute organ dysfunction: Secondary | ICD-10-CM | POA: Diagnosis not present

## 2022-11-15 DIAGNOSIS — N289 Disorder of kidney and ureter, unspecified: Secondary | ICD-10-CM | POA: Diagnosis not present

## 2022-11-15 DIAGNOSIS — A419 Sepsis, unspecified organism: Secondary | ICD-10-CM | POA: Diagnosis not present

## 2022-11-15 DIAGNOSIS — Z9884 Bariatric surgery status: Secondary | ICD-10-CM | POA: Diagnosis not present

## 2022-11-15 DIAGNOSIS — R531 Weakness: Secondary | ICD-10-CM | POA: Diagnosis not present

## 2022-11-15 DIAGNOSIS — K219 Gastro-esophageal reflux disease without esophagitis: Secondary | ICD-10-CM | POA: Diagnosis not present

## 2022-11-15 DIAGNOSIS — G4733 Obstructive sleep apnea (adult) (pediatric): Secondary | ICD-10-CM | POA: Diagnosis not present

## 2022-11-15 DIAGNOSIS — M109 Gout, unspecified: Secondary | ICD-10-CM | POA: Diagnosis not present

## 2022-11-15 DIAGNOSIS — G9341 Metabolic encephalopathy: Secondary | ICD-10-CM | POA: Diagnosis not present

## 2022-11-15 DIAGNOSIS — D649 Anemia, unspecified: Secondary | ICD-10-CM | POA: Diagnosis not present

## 2022-11-15 DIAGNOSIS — N179 Acute kidney failure, unspecified: Secondary | ICD-10-CM | POA: Diagnosis not present

## 2022-11-15 DIAGNOSIS — Z7901 Long term (current) use of anticoagulants: Secondary | ICD-10-CM | POA: Diagnosis not present

## 2022-11-15 MED ORDER — FOLIC ACID 1 MG PO TABS: 1.0000 mg | ORAL_TABLET | Freq: Every day | ORAL | 0 refills | Status: DC

## 2022-11-15 MED ORDER — ADULT MULTIVITAMIN W/MINERALS CH: 1.0000 | ORAL_TABLET | Freq: Every day | ORAL | 0 refills | Status: DC

## 2022-11-15 MED ORDER — GABAPENTIN 100 MG PO CAPS: 100.0000 mg | ORAL_CAPSULE | Freq: Three times a day (TID) | ORAL | 0 refills | Status: DC

## 2022-11-15 NOTE — Plan of Care (Signed)
  Problem: Activity: Goal: Risk for activity intolerance will decrease Outcome: Progressing   

## 2022-11-15 NOTE — Progress Notes (Signed)
DISCHARGE NOTE SNF [name] to be discharged Skilled nursing facility per MD order. Patient verbalized understanding.  Skin clean, dry and intact without evidence of skin break down, no evidence of skin tears noted. IV catheter discontinued intact. Site without signs and symptoms of complications. Dressing and pressure applied. Pt denies pain at the site currently. No complaints noted.  Patient free of lines, drains, and wounds.   Discharge packet assembled. An After Visit Summary (AVS) was printed and given to the EMS personnel. Patient escorted via stretcher and discharged to [facility] via ambulance. Report called to accepting facility; all questions and concerns addressed.   Lorine Bears, RN

## 2022-11-15 NOTE — Discharge Summary (Signed)
Physician Discharge Summary  Christine [name] ZOX:[id number] DOB: [birth year] DOA: 11/08/2022  PCP: [name], DO  Admit date: 11/08/2022 Discharge date: 11/15/2022  Admitted From: Home Disposition: SNF  Recommendations for Outpatient Follow-up:  Follow up with PCP in 1-2 weeks  Discharge Condition: Stable CODE STATUS: Full Diet recommendation: Low-salt low-fat low-carb diet  Brief/Interim Summary: Christine [name] is a 72 y.o. female with a history of morbid obesity s/p Roux-en-Y, OSA on CPAP, HTN, HLD, CAD, gout, GERD who presented to the ED on 11/08/2022 after being found confused and incontinent at home on a welfare check. She was found to be febrile to 102.80F, with normal WBC, lactic acidosis (LA 3.2, bicarb 15), hypokalemic (K 2.9) with AKI (SCr 1.06). Blood cultures were drawn, though work up including CXR, UA, CT abd/pelvis, viral panels has not identified a nidus of infection. IV antibiotics and fluids were given initially, patient's mental status improved with supportive care.  Antibiotics were discontinued and she continued to improve.  Source of infection was never found thus patient meets SIRS criteria but not sepsis criteria.  At this time her ambulatory dysfunction continues, I have marked decline in her baseline ambulatory status.  As such patient is being transferred to skilled nursing facility for ongoing physical therapy with ultimate plan to discharge home and be more independent.  Discharge Diagnoses:  Principal Problem:   Sepsis (HCC) Active Problems:   Acute metabolic encephalopathy   Hypokalemia   Renal insufficiency   Normocytic anemia   Essential hypertension   Morbid obesity (HCC)   Gout   Gastroesophageal reflux disease   Hyperlipidemia  SIRS: Fever, tachypnea, no source Likely reactive in the setting of dehydration. With lactic acidosis - resolved Likely due to profound dehydration - improved with fluids Will hold further antibiotics and follow  along for fevers/symptoms - no episodes/events over the past few days CRP elevated at 22 - unclear etiology but appears to be somewhat chronically elevated Cultures remain negative to date   Acute metabolic encephalopathy: Resolved. Nonacute CT head. Memory difficulties noted on problem list. TSH wnl.  AKI, resolved: SCr 1.06 now improved to baseline. Prerenal etiology suspected given above.    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  - Continue folic acid supplementation, gabapentin   LE edema dependent - DVT study negative, likely near baseline Prediabetes: HbA1c 6.3% - Anticipate improvement after bypass/improved diet regimen. Hypokalemia: Likely secondary to GI losses and poor PO intake. Hypomagnesemia: Supplemented. Chronic normocytic anemia: Hemoconcentrated at 11.5 at admission, back down to baseline around 9 after fluid resuscitation  Hypertension: moderately well controlled. Continue home triamterene-hydrochlorothiazide  Gout: Chronic. No acute flare at this time.   Elevated LFTs: CT without clear etiology - likely secondary to hypovolemia vs underlying suspected hepatic steatosis  Hyperlipidemia: Patient reported that Crestor had been placed on hold due to Roux-en-Y OSA:  CPAP qHS Morbid obesity: s/p Roux-en-Y 09/14/2022 with Dr. Dossie Der. Body mass index is 47.74 kg/m.    Ambulatory dysfunction, acute on chronic: - Has gait instability/exercise intolerance at baseline due to obesity and leg pain. - High risk for decompensation - PT/OT consulted -continue therapy at skilled nursing facility per PT/OT recommendations  Discharge Instructions   Allergies as of 11/15/2022       Reactions   Prednisone Swelling   Legs swell    Tape Hives   Sulfa Antibiotics Rash        Medication List     STOP taking these medications  colchicine 0.6 MG tablet   metoprolol tartrate 50 MG tablet Commonly known as:  LOPRESSOR   rosuvastatin 10 MG tablet Commonly known as: Crestor       TAKE these medications    augmented betamethasone dipropionate 0.05 % cream Commonly known as: DIPROLENE-AF Apply 1 Application topically 2 (two) times daily as needed (rash).   DULoxetine 60 MG capsule Commonly known as: CYMBALTA Take 1 capsule (60 mg total) by mouth daily.   ferrous sulfate 325 (65 FE) MG EC tablet Take 325 mg by mouth daily with breakfast.   folic acid 1 MG tablet Commonly known as: FOLVITE Take 1 tablet (1 mg total) by mouth daily. Start taking on: November 16, 2022   gabapentin 100 MG capsule Commonly known as: NEURONTIN Take 1 capsule (100 mg total) by mouth 3 (three) times daily.   multivitamin with minerals Tabs tablet Take 1 tablet by mouth daily. Start taking on: November 16, 2022   oxybutynin 15 MG 24 hr tablet Commonly known as: DITROPAN XL Take 15 mg by mouth at bedtime.   pantoprazole 40 MG tablet Commonly known as: PROTONIX Take 1 tablet (40 mg total) by mouth daily.   promethazine 25 MG tablet Commonly known as: PHENERGAN Take 1 tablet (25 mg total) by mouth every 6 (six) hours as needed for nausea or vomiting.   triamterene-hydrochlorothiazide 37.5-25 MG tablet Commonly known as: MAXZIDE-25 Take 1 tablet by mouth daily.        Contact information for after-discharge care     Destination     [facility] .   Service: Skilled Nursing Contact information: [address] [place] [address] [phone]                    Allergies  Allergen Reactions   Prednisone Swelling    Legs swell    Tape Hives   Sulfa Antibiotics Rash    Consultations: None  Procedures/Studies: VAS [place] LOWER EXTREMITY VENOUS (DVT)  Result Date: 11/11/2022  Lower Venous DVT Study Patient Name:  Christine [name]  Date of Exam:   11/11/2022 Medical Rec #: [id number]       Accession #:    [id number] Date of Birth: Jun 04, 1950       Patient Gender: F Patient Age:   39 years Exam Location:  [facility] Procedure:      VAS [place] LOWER EXTREMITY VENOUS (DVT) Referring Phys: Hazeline Junker --------------------------------------------------------------------------------  Indications: Feet pain.  Limitations: Body habitus and poor ultrasound/tissue interface. Comparison Study: Previous 02/21/19 negative. Performing Technologist: McKayla Maag RVT, [place]  Examination Guidelines: A complete evaluation includes B-mode imaging, spectral Doppler, color Doppler, and power Doppler as needed of all accessible portions of each vessel. Bilateral testing is considered an integral part of a complete examination. Limited examinations for reoccurring indications may be performed as noted. The reflux portion of the exam is performed with the patient in reverse Trendelenburg.  +--------+---------------+---------+-----------+----------+--------------------+ RIGHT   CompressibilityPhasicitySpontaneityPropertiesThrombus Aging       +--------+---------------+---------+-----------+----------+--------------------+ CFV     Full           Yes      Yes                                       +--------+---------------+---------+-----------+----------+--------------------+ SFJ     Full                                                              +--------+---------------+---------+-----------+----------+--------------------+  FV Prox Full                                                              +--------+---------------+---------+-----------+----------+--------------------+ FV Mid  Full                                                              +--------+---------------+---------+-----------+----------+--------------------+ FV                     Yes      Yes                  patent by color      Distal                                               doppler               +--------+---------------+---------+-----------+----------+--------------------+ PFV     Full                                                              +--------+---------------+---------+-----------+----------+--------------------+ POP     Full           Yes      Yes                                       +--------+---------------+---------+-----------+----------+--------------------+ PTV     Full                                         limited              +--------+---------------+---------+-----------+----------+--------------------+ PERO                                                 not visualized       +--------+---------------+---------+-----------+----------+--------------------+   +--------+---------------+---------+-----------+----------+--------------------+ LEFT    CompressibilityPhasicitySpontaneityPropertiesThrombus Aging       +--------+---------------+---------+-----------+----------+--------------------+ CFV     Full           Yes      Yes                                       +--------+---------------+---------+-----------+----------+--------------------+ SFJ     Full                                                              +--------+---------------+---------+-----------+----------+--------------------+  FV Prox Full                                                              +--------+---------------+---------+-----------+----------+--------------------+ FV Mid  Full                                                              +--------+---------------+---------+-----------+----------+--------------------+ FV                     Yes      Yes                  patent by color      Distal                                               doppler              +--------+---------------+---------+-----------+----------+--------------------+ PFV     Full                                                               +--------+---------------+---------+-----------+----------+--------------------+ POP     Full           Yes      Yes                                       +--------+---------------+---------+-----------+----------+--------------------+ PTV     Full                                         limited              +--------+---------------+---------+-----------+----------+--------------------+ PERO                                                 not visualized       +--------+---------------+---------+-----------+----------+--------------------+     Summary: RIGHT: - There is no evidence of deep vein thrombosis in the lower extremity. However, portions of this examination were limited- see technologist comments above.  - No cystic structure found in the popliteal fossa.  LEFT: - There is no evidence of deep vein thrombosis in the lower extremity. However, portions of this examination were limited- see technologist comments above.  - No cystic structure found in the popliteal fossa.  *See table(s) above for measurements and observations. Electronically signed by Heath Lark on 11/11/2022 at 5:33:32 PM.    Final    CT ABDOMEN PELVIS  W CONTRAST  Result Date: 11/09/2022 CLINICAL DATA:  Abdominal pain EXAM: CT ABDOMEN AND PELVIS WITH CONTRAST TECHNIQUE: Multidetector CT imaging of the abdomen and pelvis was performed using the standard protocol following bolus administration of intravenous contrast. RADIATION DOSE REDUCTION: This exam was performed according to the departmental dose-optimization program which includes automated exposure control, adjustment of the mA and/or kV according to patient size and/or use of iterative reconstruction technique. CONTRAST:  OMNIPAQUE IOHEXOL 350 MG/ML SOLN COMPARISON:  10/15/2022 FINDINGS: Lower Chest: Normal. Hepatobiliary: Normal hepatic contours. No intra- or extrahepatic biliary dilatation. Normal gallbladder. Pancreas: Normal pancreas. No ductal  dilatation or peripancreatic fluid collection. Spleen: Normal. Adrenals/Urinary Tract: The adrenal glands are normal. Unchanged appearance of left lower pole renal cyst measuring up to 6.7 cm, Bosniak class 2, no follow-up imaging required. No, Nephroureterolithiasis or solid renal mass. The urinary bladder is normal for degree of distention Stomach/Bowel: There is no hiatal hernia. Normal duodenal course and caliber. Prior gastric surgery. No small bowel dilatation or inflammation. No focal colonic abnormality. Normal appendix. Vascular/Lymphatic: There is calcific atherosclerosis of the abdominal aorta. No lymphadenopathy. Reproductive: Status post hysterectomy. No adnexal mass. Other: None. Musculoskeletal: No bony spinal canal stenosis or focal osseous abnormality. IMPRESSION: No acute abnormality of the abdomen or pelvis. Aortic atherosclerosis (ICD10-I70.0). Electronically Signed   By: Deatra Robinson M.D.   On: 11/09/2022 01:37   DG Chest Port 1 View  Result Date: 11/08/2022 CLINICAL DATA:  Possible sepsis EXAM: PORTABLE CHEST 1 VIEW COMPARISON:  01/12/2022 FINDINGS: The heart size and mediastinal contours are within normal limits. Aortic atherosclerosis. Both lungs are clear. The visualized skeletal structures are unremarkable. IMPRESSION: No active disease. Electronically Signed   By: Jasmine Pang M.D.   On: 11/08/2022 23:48     Subjective: No acute issues or events overnight   Discharge Exam: Vitals:   11/15/22 0501 11/15/22 0745  BP: (!) 139/58 126/70  Pulse: 91 91  Resp: 15 16  Temp: 98 F (36.7 C) (!) 97.5 F (36.4 C)  SpO2: 100% 99%   Vitals:   11/14/22 1508 11/14/22 2008 11/15/22 0501 11/15/22 0745  BP: 139/71 116/76 (!) 139/58 126/70  Pulse: 84 94 91 91  Resp: 18 16 15 16   Temp: (!) 97.5 F (36.4 C) (!) 97.4 F (36.3 C) 98 F (36.7 C) (!) 97.5 F (36.4 C)  TempSrc: Oral Oral Oral Oral  SpO2: 98% 99% 100% 99%  Weight:      Height:        Gen: No distress Pulm:  Clear, distant, nonlabored  CV: RRR, no MRG. Dependent edema GI: Soft, NT, ND, +BS  Neuro: Alert and oriented. No new focal deficits. Ext: Warm, no deformities. Tenderness to calf compression bilaterally (improving) Skin: Chronic stasis changes LE's at the distal lower extremities bilaterally; no new rashes, lesions or ulcers on visualized skin     The results of significant diagnostics from this hospitalization (including imaging, microbiology, ancillary and laboratory) are listed below for reference.     Microbiology: Recent Results (from the past 240 hour(s))  Blood Culture (routine x 2)     Status: None   Collection Time: 11/08/22 11:37 PM   Specimen: BLOOD  Result Value Ref Range Status   Specimen Description BLOOD SITE NOT SPECIFIED  Final   Special Requests   Final    BOTTLES DRAWN AEROBIC AND ANAEROBIC Blood Culture adequate volume   Culture   Final    NO GROWTH 5 DAYS Performed at [facility], [address], [place], [place] [address]    Report Status 11/14/2022 FINAL  Final  Blood Culture (routine x 2)     Status: None   Collection Time: 11/08/22 11:42 PM   Specimen: BLOOD  Result Value Ref Range Status   Specimen Description BLOOD RIGHT ANTECUBITAL  Final   Special Requests   Final    BOTTLES DRAWN AEROBIC AND ANAEROBIC Blood Culture adequate volume   Culture   Final    NO GROWTH 5 DAYS Performed at [facility], [address] [place], [place] [address]    Report Status 11/14/2022 FINAL  Final  Respiratory (~20 pathogens) panel by PCR     Status: None   Collection Time: 11/09/22  9:02 AM   Specimen: Nasopharyngeal Swab; Respiratory  Result Value Ref Range Status   Adenovirus NOT DETECTED NOT DETECTED Final   Coronavirus 229E NOT DETECTED NOT DETECTED Final    Comment: (NOTE) The Coronavirus on the Respiratory Panel, DOES NOT test for the novel  Coronavirus (2019 nCoV)    Coronavirus HKU1 NOT DETECTED NOT DETECTED Final   Coronavirus NL63 NOT  DETECTED NOT DETECTED Final   Coronavirus OC43 NOT DETECTED NOT DETECTED Final   Metapneumovirus NOT DETECTED NOT DETECTED Final   Rhinovirus / Enterovirus NOT DETECTED NOT DETECTED Final   Influenza A NOT DETECTED NOT DETECTED Final   Influenza B NOT DETECTED NOT DETECTED Final   Parainfluenza Virus 1 NOT DETECTED NOT DETECTED Final   Parainfluenza Virus 2 NOT DETECTED NOT DETECTED Final   Parainfluenza Virus 3 NOT DETECTED NOT DETECTED Final   Parainfluenza Virus 4 NOT DETECTED NOT DETECTED Final   Respiratory Syncytial Virus NOT DETECTED NOT DETECTED Final   Bordetella pertussis NOT DETECTED NOT DETECTED Final   Bordetella Parapertussis NOT DETECTED NOT DETECTED Final   Chlamydophila pneumoniae NOT DETECTED NOT DETECTED Final   Mycoplasma pneumoniae NOT DETECTED NOT DETECTED Final    Comment: Performed at [facility], [address] [place], [place] [address]  SARS Coronavirus 2 by RT PCR (hospital order, performed in [facility] hospital lab) *cepheid single result test* Nasopharyngeal Swab     Status: None   Collection Time: 11/09/22  9:02 AM   Specimen: Nasopharyngeal Swab; Nasal Swab  Result Value Ref Range Status   SARS Coronavirus 2 by RT PCR NEGATIVE NEGATIVE Final    Comment: Performed at [facility], [address], [place], [place] [address]  MRSA Next Gen by PCR, Nasal     Status: None   Collection Time: 11/10/22  1:17 PM   Specimen: Nasal Mucosa; Nasal Swab  Result Value Ref Range Status   MRSA by PCR Next Gen NOT DETECTED NOT DETECTED Final    Comment: (NOTE) The GeneXpert MRSA Assay (FDA approved for NASAL specimens only), is one component of a comprehensive MRSA colonization surveillance program. It is not intended to diagnose MRSA infection nor to guide or monitor treatment for MRSA infections. Test performance is not FDA approved in patients less than 41 years old. Performed at [facility], [address] [place], [place] [address]       Labs: BNP (last 3 results) No results for input(s): "BNP" in the last 8760 hours. Basic Metabolic Panel: Recent Labs  Lab 11/08/22 2335 11/09/22 0930 11/10/22 0105  NA 143 141 139  K 2.9* 4.0 3.6  CL 111 111 112*  CO2 15* 19* 19*  GLUCOSE 118* 93 131*  BUN 24* 21 19  CREATININE 1.06* 0.80 0.68  CALCIUM 9.4 8.5* 8.8*  MG  --  1.6*  --    Liver Function Tests: Recent Labs  Lab 11/08/22 2335 11/09/22 0930  AST 58* 68*  ALT 31 36  ALKPHOS 70 60  BILITOT 2.0* 1.3*  PROT 7.6 6.3*  ALBUMIN 3.1* 2.5*   Recent Labs  Lab 11/08/22 2335  LIPASE 22   No results for input(s): "AMMONIA" in the last 168 hours. CBC: Recent Labs  Lab 11/08/22 2335 11/09/22 0930 11/10/22 0105  WBC 8.9 9.7 6.5  NEUTROABS 6.1  --   --   HGB 11.5* 9.6* 9.9*  HCT 36.3 31.1* 31.8*  MCV 85.2 84.7 84.1  PLT 256 223 245   Cardiac Enzymes: No results for input(s): "CKTOTAL", "CKMB", "CKMBINDEX", "TROPONINI" in the last 168 hours. BNP: Invalid input(s): "POCBNP" CBG: No results for input(s): "GLUCAP" in the last 168 hours. D-Dimer No results for input(s): "DDIMER" in the last 72 hours. Hgb A1c No results for input(s): "HGBA1C" in the last 72 hours. Lipid Profile No results for input(s): "CHOL", "HDL", "LDLCALC", "TRIG", "CHOLHDL", "LDLDIRECT" in the last 72 hours. Thyroid function studies No results for input(s): "TSH", "T4TOTAL", "T3FREE", "THYROIDAB" in the last 72 hours.  Invalid input(s): "FREET3" Anemia work up No results for input(s): "VITAMINB12", "FOLATE", "FERRITIN", "TIBC", "IRON", "RETICCTPCT" in the last 72 hours. Urinalysis    Component Value Date/Time   COLORURINE AMBER (A) 11/09/2022 0155   APPEARANCEUR CLEAR 11/09/2022 0155   LABSPEC 1.043 (H) 11/09/2022 0155   PHURINE 6.0 11/09/2022 0155   GLUCOSEU NEGATIVE 11/09/2022 0155   HGBUR NEGATIVE 11/09/2022 0155   BILIRUBINUR NEGATIVE 11/09/2022 0155   KETONESUR 20 (A) 11/09/2022 0155   PROTEINUR 100 (A) 11/09/2022 0155    NITRITE NEGATIVE 11/09/2022 0155   LEUKOCYTESUR NEGATIVE 11/09/2022 0155   Sepsis Labs Recent Labs  Lab 11/08/22 2335 11/09/22 0930 11/10/22 0105  WBC 8.9 9.7 6.5   Microbiology Recent Results (from the past 240 hour(s))  Blood Culture (routine x 2)     Status: None   Collection Time: 11/08/22 11:37 PM   Specimen: BLOOD  Result Value Ref Range Status   Specimen Description BLOOD SITE NOT SPECIFIED  Final   Special Requests   Final    BOTTLES DRAWN AEROBIC AND ANAEROBIC Blood Culture adequate volume   Culture   Final    NO GROWTH 5 DAYS Performed at [facility], [address], [place], [place] [address]    Report Status 11/14/2022 FINAL  Final  Blood Culture (routine x 2)     Status: None   Collection Time: 11/08/22 11:42 PM   Specimen: BLOOD  Result Value Ref Range Status   Specimen Description BLOOD RIGHT ANTECUBITAL  Final   Special Requests   Final    BOTTLES DRAWN AEROBIC AND ANAEROBIC Blood Culture adequate volume   Culture   Final    NO GROWTH 5 DAYS Performed at [facility], [address], [place], [place] [address]    Report Status 11/14/2022 FINAL  Final  Respiratory (~20 pathogens) panel by PCR     Status: None   Collection Time: 11/09/22  9:02 AM   Specimen: Nasopharyngeal Swab; Respiratory  Result Value Ref Range Status   Adenovirus NOT DETECTED NOT DETECTED Final   Coronavirus 229E NOT DETECTED NOT DETECTED Final    Comment: (NOTE) The Coronavirus on the Respiratory Panel, DOES NOT test for the novel  Coronavirus (2019  nCoV)    Coronavirus HKU1 NOT DETECTED NOT DETECTED Final   Coronavirus NL63 NOT DETECTED NOT DETECTED Final   Coronavirus OC43 NOT DETECTED NOT DETECTED Final   Metapneumovirus NOT DETECTED NOT DETECTED Final   Rhinovirus / Enterovirus NOT DETECTED NOT DETECTED Final   Influenza A NOT DETECTED NOT DETECTED Final   Influenza B NOT DETECTED NOT DETECTED Final   Parainfluenza Virus 1 NOT DETECTED NOT DETECTED Final    Parainfluenza Virus 2 NOT DETECTED NOT DETECTED Final   Parainfluenza Virus 3 NOT DETECTED NOT DETECTED Final   Parainfluenza Virus 4 NOT DETECTED NOT DETECTED Final   Respiratory Syncytial Virus NOT DETECTED NOT DETECTED Final   Bordetella pertussis NOT DETECTED NOT DETECTED Final   Bordetella Parapertussis NOT DETECTED NOT DETECTED Final   Chlamydophila pneumoniae NOT DETECTED NOT DETECTED Final   Mycoplasma pneumoniae NOT DETECTED NOT DETECTED Final    Comment: Performed at [facility], [address], [place], [place] [address]  SARS Coronavirus 2 by RT PCR (hospital order, performed in [facility] hospital lab) *cepheid single result test* Nasopharyngeal Swab     Status: None   Collection Time: 11/09/22  9:02 AM   Specimen: Nasopharyngeal Swab; Nasal Swab  Result Value Ref Range Status   SARS Coronavirus 2 by RT PCR NEGATIVE NEGATIVE Final    Comment: Performed at [facility], [address], [place], [place] [address]  MRSA Next Gen by PCR, Nasal     Status: None   Collection Time: 11/10/22  1:17 PM   Specimen: Nasal Mucosa; Nasal Swab  Result Value Ref Range Status   MRSA by PCR Next Gen NOT DETECTED NOT DETECTED Final    Comment: (NOTE) The GeneXpert MRSA Assay (FDA approved for NASAL specimens only), is one component of a comprehensive MRSA colonization surveillance program. It is not intended to diagnose MRSA infection nor to guide or monitor treatment for MRSA infections. Test performance is not FDA approved in patients less than 68 years old. Performed at [facility], [address], [place], [place] [address]      Time coordinating discharge: Over 30 minutes  SIGNED:   Azucena Fallen, DO [facility] 11/15/2022, 11:10 AM Pager   If 7PM-7AM, please contact night-coverage [email or web address]

## 2022-11-15 NOTE — TOC Transition Note (Addendum)
Transition of Care [facility]) - CM/SW Discharge Note   Patient Details  Name: Christine [name] MRN: [id number] Date of Birth: Aug 22, 1950  Transition of Care [facility]) CM/SW Contact:  Vincen Bejar A [place], Theresia Majors Phone Number: 11/15/2022, 4:01 PM   Clinical Narrative:     Patient will DC to: [facility]  Anticipated DC date: 11/15/22  Family notified: Pt declined CSW contact, pt notified her family of discharge  Transport by: [facility]  Certification # [id number]    Per MD patient ready for DC to [facility]. RN, patient, patient's family, and facility notified of DC. Discharge Summary and FL2 sent to facility. RN to call report prior to discharge ( Room 104, number for report [phone] ). DC packet on chart. Ambulance transport requested for patient.     CSW will sign off for now as social work intervention is no longer needed. Please consult [place] again if new needs arise.    Final next level of care: Skilled Nursing Facility Barriers to Discharge: Barriers Resolved   Patient Goals and CMS Choice CMS Medicare.gov Compare Post Acute Care list provided to:: Patient Choice offered to / list presented to : Patient  Discharge Placement                Patient chooses bed at:  [facility]) Patient to be transferred to facility by: [facility] Name of family member notified: Pt declined CSW contact, stated she notified her family Patient and family notified of of transfer: 11/15/22  Discharge Plan and Services Additional resources added to the After Visit Summary for     Discharge Planning Services: CM Consult Post Acute Care Choice: Skilled Nursing Facility                               Social Determinants of Health (SDOH) Interventions SDOH Screenings   Food Insecurity: No Food Insecurity (11/09/2022)  Housing: Patient Declined (11/09/2022)  Transportation Needs: No Transportation Needs (11/09/2022)  Utilities: Not At Risk (11/09/2022)  Alcohol Screen: Low Risk  (07/13/2022)  Depression  (PHQ2-9): Low Risk  (01/21/2022)  Financial Resource Strain: Low Risk  (07/13/2022)  Physical Activity: Insufficiently Active (07/13/2022)  Social Connections: Moderately Isolated (07/13/2022)  Stress: No Stress Concern Present (07/13/2022)  Tobacco Use: Medium Risk (11/08/2022)     Readmission Risk Interventions     No data to display

## 2022-11-16 DIAGNOSIS — K219 Gastro-esophageal reflux disease without esophagitis: Secondary | ICD-10-CM | POA: Diagnosis not present

## 2022-11-16 DIAGNOSIS — Z6841 Body Mass Index (BMI) 40.0 and over, adult: Secondary | ICD-10-CM | POA: Diagnosis not present

## 2022-11-16 DIAGNOSIS — E785 Hyperlipidemia, unspecified: Secondary | ICD-10-CM | POA: Diagnosis not present

## 2022-11-16 DIAGNOSIS — G9009 Other idiopathic peripheral autonomic neuropathy: Secondary | ICD-10-CM | POA: Diagnosis not present

## 2022-11-16 DIAGNOSIS — F339 Major depressive disorder, recurrent, unspecified: Secondary | ICD-10-CM | POA: Diagnosis not present

## 2022-11-16 DIAGNOSIS — D649 Anemia, unspecified: Secondary | ICD-10-CM | POA: Diagnosis not present

## 2022-11-16 DIAGNOSIS — G4733 Obstructive sleep apnea (adult) (pediatric): Secondary | ICD-10-CM | POA: Diagnosis not present

## 2022-11-16 DIAGNOSIS — G9341 Metabolic encephalopathy: Secondary | ICD-10-CM | POA: Diagnosis not present

## 2022-11-16 DIAGNOSIS — E876 Hypokalemia: Secondary | ICD-10-CM | POA: Diagnosis not present

## 2022-11-16 DIAGNOSIS — N289 Disorder of kidney and ureter, unspecified: Secondary | ICD-10-CM | POA: Diagnosis not present

## 2022-11-16 DIAGNOSIS — I10 Essential (primary) hypertension: Secondary | ICD-10-CM | POA: Diagnosis not present

## 2022-11-21 DIAGNOSIS — G4733 Obstructive sleep apnea (adult) (pediatric): Secondary | ICD-10-CM | POA: Diagnosis not present

## 2022-11-22 DIAGNOSIS — R5381 Other malaise: Secondary | ICD-10-CM | POA: Diagnosis not present

## 2022-11-22 DIAGNOSIS — G9341 Metabolic encephalopathy: Secondary | ICD-10-CM | POA: Diagnosis not present

## 2022-11-23 ENCOUNTER — Encounter: Payer: Medicare HMO | Admitting: Family Medicine

## 2022-11-23 DIAGNOSIS — Z9884 Bariatric surgery status: Secondary | ICD-10-CM | POA: Diagnosis not present

## 2022-11-23 DIAGNOSIS — N289 Disorder of kidney and ureter, unspecified: Secondary | ICD-10-CM | POA: Diagnosis not present

## 2022-11-23 DIAGNOSIS — Z7901 Long term (current) use of anticoagulants: Secondary | ICD-10-CM | POA: Diagnosis not present

## 2022-11-23 DIAGNOSIS — I10 Essential (primary) hypertension: Secondary | ICD-10-CM | POA: Diagnosis not present

## 2022-11-24 ENCOUNTER — Encounter: Payer: Medicare HMO | Admitting: Family Medicine

## 2022-11-30 ENCOUNTER — Telehealth: Payer: Self-pay | Admitting: Family Medicine

## 2022-11-30 DIAGNOSIS — E860 Dehydration: Secondary | ICD-10-CM | POA: Diagnosis not present

## 2022-11-30 DIAGNOSIS — M109 Gout, unspecified: Secondary | ICD-10-CM | POA: Diagnosis not present

## 2022-11-30 DIAGNOSIS — Z743 Need for continuous supervision: Secondary | ICD-10-CM | POA: Diagnosis not present

## 2022-11-30 DIAGNOSIS — G9341 Metabolic encephalopathy: Secondary | ICD-10-CM | POA: Diagnosis not present

## 2022-11-30 DIAGNOSIS — Z9049 Acquired absence of other specified parts of digestive tract: Secondary | ICD-10-CM | POA: Diagnosis not present

## 2022-11-30 DIAGNOSIS — E876 Hypokalemia: Secondary | ICD-10-CM | POA: Diagnosis not present

## 2022-11-30 DIAGNOSIS — R7303 Prediabetes: Secondary | ICD-10-CM | POA: Diagnosis not present

## 2022-11-30 DIAGNOSIS — E441 Mild protein-calorie malnutrition: Secondary | ICD-10-CM | POA: Diagnosis not present

## 2022-11-30 DIAGNOSIS — N289 Disorder of kidney and ureter, unspecified: Secondary | ICD-10-CM | POA: Diagnosis not present

## 2022-11-30 DIAGNOSIS — R262 Difficulty in walking, not elsewhere classified: Secondary | ICD-10-CM | POA: Diagnosis not present

## 2022-11-30 DIAGNOSIS — Z6841 Body Mass Index (BMI) 40.0 and over, adult: Secondary | ICD-10-CM | POA: Diagnosis not present

## 2022-11-30 DIAGNOSIS — M6281 Muscle weakness (generalized): Secondary | ICD-10-CM | POA: Diagnosis not present

## 2022-11-30 DIAGNOSIS — Z9884 Bariatric surgery status: Secondary | ICD-10-CM | POA: Diagnosis not present

## 2022-11-30 DIAGNOSIS — R1312 Dysphagia, oropharyngeal phase: Secondary | ICD-10-CM | POA: Diagnosis not present

## 2022-11-30 DIAGNOSIS — F411 Generalized anxiety disorder: Secondary | ICD-10-CM | POA: Diagnosis not present

## 2022-11-30 DIAGNOSIS — R079 Chest pain, unspecified: Secondary | ICD-10-CM | POA: Diagnosis not present

## 2022-11-30 DIAGNOSIS — R41841 Cognitive communication deficit: Secondary | ICD-10-CM | POA: Diagnosis not present

## 2022-11-30 DIAGNOSIS — G4733 Obstructive sleep apnea (adult) (pediatric): Secondary | ICD-10-CM | POA: Diagnosis not present

## 2022-11-30 DIAGNOSIS — D649 Anemia, unspecified: Secondary | ICD-10-CM | POA: Diagnosis not present

## 2022-11-30 DIAGNOSIS — N179 Acute kidney failure, unspecified: Secondary | ICD-10-CM | POA: Diagnosis not present

## 2022-11-30 DIAGNOSIS — F419 Anxiety disorder, unspecified: Secondary | ICD-10-CM | POA: Diagnosis not present

## 2022-11-30 DIAGNOSIS — K219 Gastro-esophageal reflux disease without esophagitis: Secondary | ICD-10-CM | POA: Diagnosis not present

## 2022-11-30 DIAGNOSIS — Z888 Allergy status to other drugs, medicaments and biological substances status: Secondary | ICD-10-CM | POA: Diagnosis not present

## 2022-11-30 DIAGNOSIS — Z8619 Personal history of other infectious and parasitic diseases: Secondary | ICD-10-CM | POA: Diagnosis not present

## 2022-11-30 DIAGNOSIS — E785 Hyperlipidemia, unspecified: Secondary | ICD-10-CM | POA: Diagnosis not present

## 2022-11-30 DIAGNOSIS — R54 Age-related physical debility: Secondary | ICD-10-CM | POA: Diagnosis not present

## 2022-11-30 DIAGNOSIS — F339 Major depressive disorder, recurrent, unspecified: Secondary | ICD-10-CM | POA: Diagnosis not present

## 2022-11-30 DIAGNOSIS — I10 Essential (primary) hypertension: Secondary | ICD-10-CM | POA: Diagnosis not present

## 2022-11-30 DIAGNOSIS — R441 Visual hallucinations: Secondary | ICD-10-CM | POA: Diagnosis not present

## 2022-11-30 DIAGNOSIS — G629 Polyneuropathy, unspecified: Secondary | ICD-10-CM | POA: Diagnosis not present

## 2022-11-30 DIAGNOSIS — Z79899 Other long term (current) drug therapy: Secondary | ICD-10-CM | POA: Diagnosis not present

## 2022-11-30 DIAGNOSIS — Z7984 Long term (current) use of oral hypoglycemic drugs: Secondary | ICD-10-CM | POA: Diagnosis not present

## 2022-11-30 DIAGNOSIS — R5381 Other malaise: Secondary | ICD-10-CM | POA: Diagnosis not present

## 2022-11-30 DIAGNOSIS — Z91048 Other nonmedicinal substance allergy status: Secondary | ICD-10-CM | POA: Diagnosis not present

## 2022-11-30 DIAGNOSIS — Z882 Allergy status to sulfonamides status: Secondary | ICD-10-CM | POA: Diagnosis not present

## 2022-11-30 DIAGNOSIS — G9009 Other idiopathic peripheral autonomic neuropathy: Secondary | ICD-10-CM | POA: Diagnosis not present

## 2022-11-30 DIAGNOSIS — E669 Obesity, unspecified: Secondary | ICD-10-CM | POA: Diagnosis not present

## 2022-11-30 DIAGNOSIS — Z90710 Acquired absence of both cervix and uterus: Secondary | ICD-10-CM | POA: Diagnosis not present

## 2022-11-30 NOTE — Telephone Encounter (Signed)
Pts brother called stating after her discharge from the hospital she was put into rehab. At the rehab facility she was given some medications that are making her feel lethargic, brother was unsure of the names of the prescriptions but now he is concerned because she is not able to advocate for herself on these medications. Now she has been admitted back to the hospital due to concerning numbers regarding kidney failure. He was wondering if PCP was able to get involved, advised brother that she is under the care of attending physicians at this point, and it would be best for the pt to notify the physicians there if she wants to discontinue medications as he is not on DPR & doesn't think she has a POA. Advised brother a message would be sent to the provider to make sure he is aware of the situation.

## 2022-11-30 NOTE — Telephone Encounter (Signed)
Pt's brother called and wanted to inform us that she was admitted

## 2022-11-30 NOTE — Telephone Encounter (Signed)
Noted. I have no control over her care at this time. Would rec discussing this with her hospitalist if she is inpatient right now.

## 2022-12-01 DIAGNOSIS — M6281 Muscle weakness (generalized): Secondary | ICD-10-CM | POA: Diagnosis not present

## 2022-12-01 DIAGNOSIS — E441 Mild protein-calorie malnutrition: Secondary | ICD-10-CM | POA: Diagnosis not present

## 2022-12-01 DIAGNOSIS — N289 Disorder of kidney and ureter, unspecified: Secondary | ICD-10-CM | POA: Diagnosis not present

## 2022-12-01 DIAGNOSIS — I10 Essential (primary) hypertension: Secondary | ICD-10-CM | POA: Diagnosis not present

## 2022-12-01 DIAGNOSIS — D649 Anemia, unspecified: Secondary | ICD-10-CM | POA: Diagnosis not present

## 2022-12-01 NOTE — Telephone Encounter (Signed)
Called the brother and unable to speak to him. Will close note because nothing at this time PCP can do. Patients brother also is not on her DPR/and unknown about POA.

## 2022-12-06 ENCOUNTER — Ambulatory Visit: Payer: Medicare HMO | Admitting: Skilled Nursing Facility1

## 2022-12-06 DIAGNOSIS — F419 Anxiety disorder, unspecified: Secondary | ICD-10-CM | POA: Diagnosis not present

## 2022-12-06 DIAGNOSIS — M79605 Pain in left leg: Secondary | ICD-10-CM | POA: Diagnosis not present

## 2022-12-06 DIAGNOSIS — R404 Transient alteration of awareness: Secondary | ICD-10-CM | POA: Diagnosis not present

## 2022-12-06 DIAGNOSIS — Q396 Congenital diverticulum of esophagus: Secondary | ICD-10-CM | POA: Diagnosis not present

## 2022-12-06 DIAGNOSIS — M25562 Pain in left knee: Secondary | ICD-10-CM | POA: Diagnosis not present

## 2022-12-06 DIAGNOSIS — I10 Essential (primary) hypertension: Secondary | ICD-10-CM | POA: Diagnosis not present

## 2022-12-06 DIAGNOSIS — Z888 Allergy status to other drugs, medicaments and biological substances status: Secondary | ICD-10-CM | POA: Diagnosis not present

## 2022-12-06 DIAGNOSIS — R0902 Hypoxemia: Secondary | ICD-10-CM | POA: Diagnosis not present

## 2022-12-06 DIAGNOSIS — K8010 Calculus of gallbladder with chronic cholecystitis without obstruction: Secondary | ICD-10-CM | POA: Diagnosis not present

## 2022-12-06 DIAGNOSIS — D631 Anemia in chronic kidney disease: Secondary | ICD-10-CM | POA: Diagnosis not present

## 2022-12-06 DIAGNOSIS — I129 Hypertensive chronic kidney disease with stage 1 through stage 4 chronic kidney disease, or unspecified chronic kidney disease: Secondary | ICD-10-CM | POA: Diagnosis not present

## 2022-12-06 DIAGNOSIS — R109 Unspecified abdominal pain: Secondary | ICD-10-CM | POA: Diagnosis not present

## 2022-12-06 DIAGNOSIS — G9341 Metabolic encephalopathy: Secondary | ICD-10-CM | POA: Diagnosis not present

## 2022-12-06 DIAGNOSIS — I2510 Atherosclerotic heart disease of native coronary artery without angina pectoris: Secondary | ICD-10-CM | POA: Diagnosis not present

## 2022-12-06 DIAGNOSIS — E860 Dehydration: Secondary | ICD-10-CM | POA: Diagnosis not present

## 2022-12-06 DIAGNOSIS — K219 Gastro-esophageal reflux disease without esophagitis: Secondary | ICD-10-CM | POA: Diagnosis not present

## 2022-12-06 DIAGNOSIS — R600 Localized edema: Secondary | ICD-10-CM | POA: Diagnosis not present

## 2022-12-06 DIAGNOSIS — E162 Hypoglycemia, unspecified: Secondary | ICD-10-CM | POA: Diagnosis not present

## 2022-12-06 DIAGNOSIS — L89152 Pressure ulcer of sacral region, stage 2: Secondary | ICD-10-CM | POA: Diagnosis not present

## 2022-12-06 DIAGNOSIS — D649 Anemia, unspecified: Secondary | ICD-10-CM | POA: Diagnosis not present

## 2022-12-06 DIAGNOSIS — M25561 Pain in right knee: Secondary | ICD-10-CM | POA: Diagnosis not present

## 2022-12-06 DIAGNOSIS — Z515 Encounter for palliative care: Secondary | ICD-10-CM | POA: Diagnosis not present

## 2022-12-06 DIAGNOSIS — D638 Anemia in other chronic diseases classified elsewhere: Secondary | ICD-10-CM | POA: Diagnosis not present

## 2022-12-06 DIAGNOSIS — R262 Difficulty in walking, not elsewhere classified: Secondary | ICD-10-CM | POA: Diagnosis not present

## 2022-12-06 DIAGNOSIS — R7303 Prediabetes: Secondary | ICD-10-CM | POA: Diagnosis not present

## 2022-12-06 DIAGNOSIS — K828 Other specified diseases of gallbladder: Secondary | ICD-10-CM | POA: Diagnosis not present

## 2022-12-06 DIAGNOSIS — E871 Hypo-osmolality and hyponatremia: Secondary | ICD-10-CM | POA: Diagnosis not present

## 2022-12-06 DIAGNOSIS — Z6841 Body Mass Index (BMI) 40.0 and over, adult: Secondary | ICD-10-CM | POA: Diagnosis not present

## 2022-12-06 DIAGNOSIS — M6281 Muscle weakness (generalized): Secondary | ICD-10-CM | POA: Diagnosis not present

## 2022-12-06 DIAGNOSIS — F330 Major depressive disorder, recurrent, mild: Secondary | ICD-10-CM | POA: Diagnosis not present

## 2022-12-06 DIAGNOSIS — E876 Hypokalemia: Secondary | ICD-10-CM | POA: Diagnosis not present

## 2022-12-06 DIAGNOSIS — N179 Acute kidney failure, unspecified: Secondary | ICD-10-CM | POA: Diagnosis not present

## 2022-12-06 DIAGNOSIS — K8020 Calculus of gallbladder without cholecystitis without obstruction: Secondary | ICD-10-CM | POA: Diagnosis not present

## 2022-12-06 DIAGNOSIS — Z743 Need for continuous supervision: Secondary | ICD-10-CM | POA: Diagnosis not present

## 2022-12-06 DIAGNOSIS — M109 Gout, unspecified: Secondary | ICD-10-CM | POA: Diagnosis not present

## 2022-12-06 DIAGNOSIS — Z79899 Other long term (current) drug therapy: Secondary | ICD-10-CM | POA: Diagnosis not present

## 2022-12-06 DIAGNOSIS — E785 Hyperlipidemia, unspecified: Secondary | ICD-10-CM | POA: Diagnosis not present

## 2022-12-06 DIAGNOSIS — Z1331 Encounter for screening for depression: Secondary | ICD-10-CM | POA: Diagnosis not present

## 2022-12-06 DIAGNOSIS — R41841 Cognitive communication deficit: Secondary | ICD-10-CM | POA: Diagnosis not present

## 2022-12-06 DIAGNOSIS — F05 Delirium due to known physiological condition: Secondary | ICD-10-CM | POA: Diagnosis not present

## 2022-12-06 DIAGNOSIS — E119 Type 2 diabetes mellitus without complications: Secondary | ICD-10-CM | POA: Diagnosis not present

## 2022-12-06 DIAGNOSIS — F329 Major depressive disorder, single episode, unspecified: Secondary | ICD-10-CM | POA: Diagnosis not present

## 2022-12-06 DIAGNOSIS — G9009 Other idiopathic peripheral autonomic neuropathy: Secondary | ICD-10-CM | POA: Diagnosis not present

## 2022-12-06 DIAGNOSIS — Z9884 Bariatric surgery status: Secondary | ICD-10-CM | POA: Diagnosis not present

## 2022-12-06 DIAGNOSIS — R413 Other amnesia: Secondary | ICD-10-CM | POA: Diagnosis not present

## 2022-12-06 DIAGNOSIS — I4891 Unspecified atrial fibrillation: Secondary | ICD-10-CM | POA: Diagnosis not present

## 2022-12-06 DIAGNOSIS — R000 Tachycardia, unspecified: Secondary | ICD-10-CM | POA: Diagnosis not present

## 2022-12-06 DIAGNOSIS — E441 Mild protein-calorie malnutrition: Secondary | ICD-10-CM | POA: Diagnosis not present

## 2022-12-06 DIAGNOSIS — R531 Weakness: Secondary | ICD-10-CM | POA: Diagnosis not present

## 2022-12-06 DIAGNOSIS — F32A Depression, unspecified: Secondary | ICD-10-CM | POA: Diagnosis not present

## 2022-12-06 DIAGNOSIS — N390 Urinary tract infection, site not specified: Secondary | ICD-10-CM | POA: Diagnosis not present

## 2022-12-06 DIAGNOSIS — W19XXXA Unspecified fall, initial encounter: Secondary | ICD-10-CM | POA: Diagnosis not present

## 2022-12-06 DIAGNOSIS — L89611 Pressure ulcer of right heel, stage 1: Secondary | ICD-10-CM | POA: Diagnosis not present

## 2022-12-06 DIAGNOSIS — Z66 Do not resuscitate: Secondary | ICD-10-CM | POA: Diagnosis not present

## 2022-12-06 DIAGNOSIS — R5381 Other malaise: Secondary | ICD-10-CM | POA: Diagnosis not present

## 2022-12-06 DIAGNOSIS — Z1152 Encounter for screening for COVID-19: Secondary | ICD-10-CM | POA: Diagnosis not present

## 2022-12-06 DIAGNOSIS — Z7189 Other specified counseling: Secondary | ICD-10-CM | POA: Diagnosis not present

## 2022-12-06 DIAGNOSIS — E872 Acidosis, unspecified: Secondary | ICD-10-CM | POA: Diagnosis not present

## 2022-12-06 DIAGNOSIS — F411 Generalized anxiety disorder: Secondary | ICD-10-CM | POA: Diagnosis not present

## 2022-12-06 DIAGNOSIS — E870 Hyperosmolality and hypernatremia: Secondary | ICD-10-CM | POA: Diagnosis not present

## 2022-12-06 DIAGNOSIS — R1319 Other dysphagia: Secondary | ICD-10-CM | POA: Diagnosis not present

## 2022-12-06 DIAGNOSIS — Z882 Allergy status to sulfonamides status: Secondary | ICD-10-CM | POA: Diagnosis not present

## 2022-12-06 DIAGNOSIS — R9082 White matter disease, unspecified: Secondary | ICD-10-CM | POA: Diagnosis not present

## 2022-12-06 DIAGNOSIS — L89132 Pressure ulcer of right lower back, stage 2: Secondary | ICD-10-CM | POA: Diagnosis not present

## 2022-12-06 DIAGNOSIS — R1312 Dysphagia, oropharyngeal phase: Secondary | ICD-10-CM | POA: Diagnosis not present

## 2022-12-06 DIAGNOSIS — R410 Disorientation, unspecified: Secondary | ICD-10-CM | POA: Diagnosis not present

## 2022-12-06 DIAGNOSIS — G4733 Obstructive sleep apnea (adult) (pediatric): Secondary | ICD-10-CM | POA: Diagnosis not present

## 2022-12-06 DIAGNOSIS — R0602 Shortness of breath: Secondary | ICD-10-CM | POA: Diagnosis not present

## 2022-12-06 DIAGNOSIS — K2100 Gastro-esophageal reflux disease with esophagitis, without bleeding: Secondary | ICD-10-CM | POA: Diagnosis not present

## 2022-12-06 DIAGNOSIS — N3289 Other specified disorders of bladder: Secondary | ICD-10-CM | POA: Diagnosis not present

## 2022-12-06 DIAGNOSIS — R0603 Acute respiratory distress: Secondary | ICD-10-CM | POA: Diagnosis not present

## 2022-12-06 DIAGNOSIS — M79604 Pain in right leg: Secondary | ICD-10-CM | POA: Diagnosis not present

## 2022-12-06 DIAGNOSIS — R0989 Other specified symptoms and signs involving the circulatory and respiratory systems: Secondary | ICD-10-CM | POA: Diagnosis not present

## 2022-12-06 DIAGNOSIS — K760 Fatty (change of) liver, not elsewhere classified: Secondary | ICD-10-CM | POA: Diagnosis not present

## 2022-12-06 DIAGNOSIS — R4182 Altered mental status, unspecified: Secondary | ICD-10-CM | POA: Diagnosis not present

## 2022-12-06 DIAGNOSIS — N289 Disorder of kidney and ureter, unspecified: Secondary | ICD-10-CM | POA: Diagnosis not present

## 2022-12-06 DIAGNOSIS — R933 Abnormal findings on diagnostic imaging of other parts of digestive tract: Secondary | ICD-10-CM | POA: Diagnosis not present

## 2022-12-06 DIAGNOSIS — R059 Cough, unspecified: Secondary | ICD-10-CM | POA: Diagnosis not present

## 2022-12-06 DIAGNOSIS — F29 Unspecified psychosis not due to a substance or known physiological condition: Secondary | ICD-10-CM | POA: Diagnosis not present

## 2022-12-06 DIAGNOSIS — Z87891 Personal history of nicotine dependence: Secondary | ICD-10-CM | POA: Diagnosis not present

## 2022-12-06 DIAGNOSIS — L0390 Cellulitis, unspecified: Secondary | ICD-10-CM | POA: Diagnosis not present

## 2022-12-06 DIAGNOSIS — R627 Adult failure to thrive: Secondary | ICD-10-CM | POA: Diagnosis not present

## 2022-12-06 DIAGNOSIS — Z1612 Extended spectrum beta lactamase (ESBL) resistance: Secondary | ICD-10-CM | POA: Diagnosis not present

## 2022-12-06 DIAGNOSIS — F339 Major depressive disorder, recurrent, unspecified: Secondary | ICD-10-CM | POA: Diagnosis not present

## 2022-12-06 DIAGNOSIS — I872 Venous insufficiency (chronic) (peripheral): Secondary | ICD-10-CM | POA: Diagnosis not present

## 2022-12-07 DIAGNOSIS — E785 Hyperlipidemia, unspecified: Secondary | ICD-10-CM | POA: Diagnosis not present

## 2022-12-07 DIAGNOSIS — G9009 Other idiopathic peripheral autonomic neuropathy: Secondary | ICD-10-CM | POA: Diagnosis not present

## 2022-12-07 DIAGNOSIS — D649 Anemia, unspecified: Secondary | ICD-10-CM | POA: Diagnosis not present

## 2022-12-07 DIAGNOSIS — Z6841 Body Mass Index (BMI) 40.0 and over, adult: Secondary | ICD-10-CM | POA: Diagnosis not present

## 2022-12-07 DIAGNOSIS — I10 Essential (primary) hypertension: Secondary | ICD-10-CM | POA: Diagnosis not present

## 2022-12-07 DIAGNOSIS — G9341 Metabolic encephalopathy: Secondary | ICD-10-CM | POA: Diagnosis not present

## 2022-12-07 DIAGNOSIS — R5381 Other malaise: Secondary | ICD-10-CM | POA: Diagnosis not present

## 2022-12-07 DIAGNOSIS — Z9884 Bariatric surgery status: Secondary | ICD-10-CM | POA: Diagnosis not present

## 2022-12-07 DIAGNOSIS — K219 Gastro-esophageal reflux disease without esophagitis: Secondary | ICD-10-CM | POA: Diagnosis not present

## 2022-12-07 DIAGNOSIS — G4733 Obstructive sleep apnea (adult) (pediatric): Secondary | ICD-10-CM | POA: Diagnosis not present

## 2022-12-17 DIAGNOSIS — Z9884 Bariatric surgery status: Secondary | ICD-10-CM | POA: Diagnosis not present

## 2022-12-17 DIAGNOSIS — R5381 Other malaise: Secondary | ICD-10-CM | POA: Diagnosis not present

## 2022-12-17 DIAGNOSIS — I10 Essential (primary) hypertension: Secondary | ICD-10-CM | POA: Diagnosis not present

## 2022-12-17 DIAGNOSIS — Z6841 Body Mass Index (BMI) 40.0 and over, adult: Secondary | ICD-10-CM | POA: Diagnosis not present

## 2022-12-26 DIAGNOSIS — N3289 Other specified disorders of bladder: Secondary | ICD-10-CM | POA: Diagnosis not present

## 2022-12-26 DIAGNOSIS — Z6841 Body Mass Index (BMI) 40.0 and over, adult: Secondary | ICD-10-CM | POA: Diagnosis not present

## 2022-12-26 DIAGNOSIS — Z87891 Personal history of nicotine dependence: Secondary | ICD-10-CM | POA: Diagnosis not present

## 2022-12-26 DIAGNOSIS — R404 Transient alteration of awareness: Secondary | ICD-10-CM | POA: Diagnosis not present

## 2022-12-26 DIAGNOSIS — F411 Generalized anxiety disorder: Secondary | ICD-10-CM | POA: Diagnosis not present

## 2022-12-26 DIAGNOSIS — Z7189 Other specified counseling: Secondary | ICD-10-CM | POA: Diagnosis not present

## 2022-12-26 DIAGNOSIS — M79605 Pain in left leg: Secondary | ICD-10-CM | POA: Diagnosis not present

## 2022-12-26 DIAGNOSIS — N179 Acute kidney failure, unspecified: Secondary | ICD-10-CM | POA: Diagnosis present

## 2022-12-26 DIAGNOSIS — I10 Essential (primary) hypertension: Secondary | ICD-10-CM | POA: Diagnosis not present

## 2022-12-26 DIAGNOSIS — R933 Abnormal findings on diagnostic imaging of other parts of digestive tract: Secondary | ICD-10-CM | POA: Diagnosis not present

## 2022-12-26 DIAGNOSIS — E785 Hyperlipidemia, unspecified: Secondary | ICD-10-CM | POA: Diagnosis not present

## 2022-12-26 DIAGNOSIS — I129 Hypertensive chronic kidney disease with stage 1 through stage 4 chronic kidney disease, or unspecified chronic kidney disease: Secondary | ICD-10-CM | POA: Diagnosis present

## 2022-12-26 DIAGNOSIS — E871 Hypo-osmolality and hyponatremia: Secondary | ICD-10-CM | POA: Diagnosis present

## 2022-12-26 DIAGNOSIS — I872 Venous insufficiency (chronic) (peripheral): Secondary | ICD-10-CM | POA: Diagnosis not present

## 2022-12-26 DIAGNOSIS — Z515 Encounter for palliative care: Secondary | ICD-10-CM | POA: Diagnosis not present

## 2022-12-26 DIAGNOSIS — R1319 Other dysphagia: Secondary | ICD-10-CM | POA: Diagnosis not present

## 2022-12-26 DIAGNOSIS — R262 Difficulty in walking, not elsewhere classified: Secondary | ICD-10-CM | POA: Diagnosis not present

## 2022-12-26 DIAGNOSIS — Z743 Need for continuous supervision: Secondary | ICD-10-CM | POA: Diagnosis not present

## 2022-12-26 DIAGNOSIS — Z888 Allergy status to other drugs, medicaments and biological substances status: Secondary | ICD-10-CM | POA: Diagnosis not present

## 2022-12-26 DIAGNOSIS — Z1612 Extended spectrum beta lactamase (ESBL) resistance: Secondary | ICD-10-CM | POA: Diagnosis present

## 2022-12-26 DIAGNOSIS — F29 Unspecified psychosis not due to a substance or known physiological condition: Secondary | ICD-10-CM | POA: Diagnosis not present

## 2022-12-26 DIAGNOSIS — E860 Dehydration: Secondary | ICD-10-CM | POA: Diagnosis not present

## 2022-12-26 DIAGNOSIS — R600 Localized edema: Secondary | ICD-10-CM | POA: Diagnosis not present

## 2022-12-26 DIAGNOSIS — R109 Unspecified abdominal pain: Secondary | ICD-10-CM | POA: Diagnosis not present

## 2022-12-26 DIAGNOSIS — K828 Other specified diseases of gallbladder: Secondary | ICD-10-CM | POA: Diagnosis not present

## 2022-12-26 DIAGNOSIS — E870 Hyperosmolality and hypernatremia: Secondary | ICD-10-CM | POA: Diagnosis present

## 2022-12-26 DIAGNOSIS — E162 Hypoglycemia, unspecified: Secondary | ICD-10-CM | POA: Diagnosis not present

## 2022-12-26 DIAGNOSIS — Z1152 Encounter for screening for COVID-19: Secondary | ICD-10-CM | POA: Diagnosis not present

## 2022-12-26 DIAGNOSIS — E872 Acidosis, unspecified: Secondary | ICD-10-CM | POA: Diagnosis present

## 2022-12-26 DIAGNOSIS — R0603 Acute respiratory distress: Secondary | ICD-10-CM | POA: Diagnosis not present

## 2022-12-26 DIAGNOSIS — M25561 Pain in right knee: Secondary | ICD-10-CM | POA: Diagnosis not present

## 2022-12-26 DIAGNOSIS — E119 Type 2 diabetes mellitus without complications: Secondary | ICD-10-CM | POA: Diagnosis not present

## 2022-12-26 DIAGNOSIS — F329 Major depressive disorder, single episode, unspecified: Secondary | ICD-10-CM | POA: Diagnosis present

## 2022-12-26 DIAGNOSIS — K2100 Gastro-esophageal reflux disease with esophagitis, without bleeding: Secondary | ICD-10-CM | POA: Diagnosis not present

## 2022-12-26 DIAGNOSIS — R0989 Other specified symptoms and signs involving the circulatory and respiratory systems: Secondary | ICD-10-CM | POA: Diagnosis not present

## 2022-12-26 DIAGNOSIS — M6281 Muscle weakness (generalized): Secondary | ICD-10-CM | POA: Diagnosis not present

## 2022-12-26 DIAGNOSIS — D649 Anemia, unspecified: Secondary | ICD-10-CM | POA: Diagnosis not present

## 2022-12-26 DIAGNOSIS — D638 Anemia in other chronic diseases classified elsewhere: Secondary | ICD-10-CM | POA: Diagnosis present

## 2022-12-26 DIAGNOSIS — Z79899 Other long term (current) drug therapy: Secondary | ICD-10-CM | POA: Diagnosis not present

## 2022-12-26 DIAGNOSIS — F32A Depression, unspecified: Secondary | ICD-10-CM | POA: Diagnosis not present

## 2022-12-26 DIAGNOSIS — R9082 White matter disease, unspecified: Secondary | ICD-10-CM | POA: Diagnosis not present

## 2022-12-26 DIAGNOSIS — R627 Adult failure to thrive: Secondary | ICD-10-CM | POA: Diagnosis present

## 2022-12-26 DIAGNOSIS — R1312 Dysphagia, oropharyngeal phase: Secondary | ICD-10-CM | POA: Diagnosis not present

## 2022-12-26 DIAGNOSIS — K219 Gastro-esophageal reflux disease without esophagitis: Secondary | ICD-10-CM | POA: Diagnosis not present

## 2022-12-26 DIAGNOSIS — W19XXXA Unspecified fall, initial encounter: Secondary | ICD-10-CM | POA: Diagnosis not present

## 2022-12-26 DIAGNOSIS — F05 Delirium due to known physiological condition: Secondary | ICD-10-CM | POA: Diagnosis not present

## 2022-12-26 DIAGNOSIS — E441 Mild protein-calorie malnutrition: Secondary | ICD-10-CM | POA: Diagnosis not present

## 2022-12-26 DIAGNOSIS — R41841 Cognitive communication deficit: Secondary | ICD-10-CM | POA: Diagnosis not present

## 2022-12-26 DIAGNOSIS — D631 Anemia in chronic kidney disease: Secondary | ICD-10-CM | POA: Diagnosis present

## 2022-12-26 DIAGNOSIS — R0602 Shortness of breath: Secondary | ICD-10-CM | POA: Diagnosis not present

## 2022-12-26 DIAGNOSIS — R413 Other amnesia: Secondary | ICD-10-CM | POA: Diagnosis not present

## 2022-12-26 DIAGNOSIS — G9341 Metabolic encephalopathy: Secondary | ICD-10-CM | POA: Diagnosis present

## 2022-12-26 DIAGNOSIS — R410 Disorientation, unspecified: Secondary | ICD-10-CM | POA: Diagnosis not present

## 2022-12-26 DIAGNOSIS — R4182 Altered mental status, unspecified: Secondary | ICD-10-CM | POA: Diagnosis not present

## 2022-12-26 DIAGNOSIS — L89611 Pressure ulcer of right heel, stage 1: Secondary | ICD-10-CM | POA: Diagnosis present

## 2022-12-26 DIAGNOSIS — K8010 Calculus of gallbladder with chronic cholecystitis without obstruction: Secondary | ICD-10-CM | POA: Diagnosis present

## 2022-12-26 DIAGNOSIS — F419 Anxiety disorder, unspecified: Secondary | ICD-10-CM | POA: Diagnosis not present

## 2022-12-26 DIAGNOSIS — R0902 Hypoxemia: Secondary | ICD-10-CM | POA: Diagnosis not present

## 2022-12-26 DIAGNOSIS — G9009 Other idiopathic peripheral autonomic neuropathy: Secondary | ICD-10-CM | POA: Diagnosis not present

## 2022-12-26 DIAGNOSIS — M109 Gout, unspecified: Secondary | ICD-10-CM | POA: Diagnosis not present

## 2022-12-26 DIAGNOSIS — I2510 Atherosclerotic heart disease of native coronary artery without angina pectoris: Secondary | ICD-10-CM | POA: Diagnosis not present

## 2022-12-26 DIAGNOSIS — G4733 Obstructive sleep apnea (adult) (pediatric): Secondary | ICD-10-CM | POA: Diagnosis not present

## 2022-12-26 DIAGNOSIS — R5381 Other malaise: Secondary | ICD-10-CM | POA: Diagnosis not present

## 2022-12-26 DIAGNOSIS — Q396 Congenital diverticulum of esophagus: Secondary | ICD-10-CM | POA: Diagnosis not present

## 2022-12-26 DIAGNOSIS — I4891 Unspecified atrial fibrillation: Secondary | ICD-10-CM | POA: Diagnosis not present

## 2022-12-26 DIAGNOSIS — K760 Fatty (change of) liver, not elsewhere classified: Secondary | ICD-10-CM | POA: Diagnosis present

## 2022-12-26 DIAGNOSIS — R059 Cough, unspecified: Secondary | ICD-10-CM | POA: Diagnosis not present

## 2022-12-26 DIAGNOSIS — L0390 Cellulitis, unspecified: Secondary | ICD-10-CM | POA: Diagnosis not present

## 2022-12-26 DIAGNOSIS — R531 Weakness: Secondary | ICD-10-CM | POA: Diagnosis not present

## 2022-12-26 DIAGNOSIS — F339 Major depressive disorder, recurrent, unspecified: Secondary | ICD-10-CM | POA: Diagnosis not present

## 2022-12-26 DIAGNOSIS — M25562 Pain in left knee: Secondary | ICD-10-CM | POA: Diagnosis not present

## 2022-12-26 DIAGNOSIS — E876 Hypokalemia: Secondary | ICD-10-CM | POA: Diagnosis not present

## 2022-12-26 DIAGNOSIS — Z66 Do not resuscitate: Secondary | ICD-10-CM | POA: Diagnosis present

## 2022-12-26 DIAGNOSIS — N289 Disorder of kidney and ureter, unspecified: Secondary | ICD-10-CM | POA: Diagnosis not present

## 2022-12-26 DIAGNOSIS — Z9884 Bariatric surgery status: Secondary | ICD-10-CM | POA: Diagnosis not present

## 2022-12-26 DIAGNOSIS — K8020 Calculus of gallbladder without cholecystitis without obstruction: Secondary | ICD-10-CM | POA: Diagnosis not present

## 2022-12-26 DIAGNOSIS — N390 Urinary tract infection, site not specified: Secondary | ICD-10-CM | POA: Diagnosis not present

## 2022-12-26 DIAGNOSIS — L89132 Pressure ulcer of right lower back, stage 2: Secondary | ICD-10-CM | POA: Diagnosis not present

## 2022-12-26 DIAGNOSIS — R7303 Prediabetes: Secondary | ICD-10-CM | POA: Diagnosis not present

## 2022-12-26 DIAGNOSIS — Z882 Allergy status to sulfonamides status: Secondary | ICD-10-CM | POA: Diagnosis not present

## 2022-12-26 DIAGNOSIS — M79604 Pain in right leg: Secondary | ICD-10-CM | POA: Diagnosis not present

## 2022-12-26 DIAGNOSIS — L89152 Pressure ulcer of sacral region, stage 2: Secondary | ICD-10-CM | POA: Diagnosis present

## 2022-12-26 DIAGNOSIS — R000 Tachycardia, unspecified: Secondary | ICD-10-CM | POA: Diagnosis not present

## 2022-12-27 DIAGNOSIS — G9341 Metabolic encephalopathy: Secondary | ICD-10-CM | POA: Diagnosis not present

## 2022-12-27 DIAGNOSIS — R5381 Other malaise: Secondary | ICD-10-CM | POA: Diagnosis not present

## 2022-12-29 DIAGNOSIS — M6281 Muscle weakness (generalized): Secondary | ICD-10-CM | POA: Diagnosis not present

## 2022-12-29 DIAGNOSIS — D649 Anemia, unspecified: Secondary | ICD-10-CM | POA: Diagnosis not present

## 2022-12-29 DIAGNOSIS — L89132 Pressure ulcer of right lower back, stage 2: Secondary | ICD-10-CM | POA: Diagnosis not present

## 2022-12-30 ENCOUNTER — Other Ambulatory Visit: Payer: Self-pay | Admitting: Family Medicine

## 2022-12-31 DIAGNOSIS — E119 Type 2 diabetes mellitus without complications: Secondary | ICD-10-CM | POA: Diagnosis not present

## 2023-01-03 DIAGNOSIS — R059 Cough, unspecified: Secondary | ICD-10-CM | POA: Diagnosis not present

## 2023-01-03 DIAGNOSIS — E119 Type 2 diabetes mellitus without complications: Secondary | ICD-10-CM | POA: Diagnosis not present

## 2023-01-03 DIAGNOSIS — I4891 Unspecified atrial fibrillation: Secondary | ICD-10-CM | POA: Diagnosis not present

## 2023-01-03 DIAGNOSIS — W19XXXA Unspecified fall, initial encounter: Secondary | ICD-10-CM | POA: Diagnosis not present

## 2023-01-03 DIAGNOSIS — Z888 Allergy status to other drugs, medicaments and biological substances status: Secondary | ICD-10-CM | POA: Diagnosis not present

## 2023-01-03 DIAGNOSIS — M79605 Pain in left leg: Secondary | ICD-10-CM | POA: Diagnosis not present

## 2023-01-03 DIAGNOSIS — L0390 Cellulitis, unspecified: Secondary | ICD-10-CM | POA: Diagnosis not present

## 2023-01-03 DIAGNOSIS — M79604 Pain in right leg: Secondary | ICD-10-CM | POA: Diagnosis not present

## 2023-01-03 DIAGNOSIS — G4733 Obstructive sleep apnea (adult) (pediatric): Secondary | ICD-10-CM | POA: Diagnosis not present

## 2023-01-03 DIAGNOSIS — Z743 Need for continuous supervision: Secondary | ICD-10-CM | POA: Diagnosis not present

## 2023-01-03 DIAGNOSIS — R531 Weakness: Secondary | ICD-10-CM | POA: Diagnosis not present

## 2023-01-03 DIAGNOSIS — R0603 Acute respiratory distress: Secondary | ICD-10-CM | POA: Diagnosis not present

## 2023-01-03 DIAGNOSIS — R0989 Other specified symptoms and signs involving the circulatory and respiratory systems: Secondary | ICD-10-CM | POA: Diagnosis not present

## 2023-01-03 DIAGNOSIS — R4182 Altered mental status, unspecified: Secondary | ICD-10-CM | POA: Diagnosis not present

## 2023-01-03 DIAGNOSIS — Z87891 Personal history of nicotine dependence: Secondary | ICD-10-CM | POA: Diagnosis not present

## 2023-01-03 DIAGNOSIS — R600 Localized edema: Secondary | ICD-10-CM | POA: Diagnosis not present

## 2023-01-03 DIAGNOSIS — I872 Venous insufficiency (chronic) (peripheral): Secondary | ICD-10-CM | POA: Diagnosis not present

## 2023-01-03 DIAGNOSIS — R000 Tachycardia, unspecified: Secondary | ICD-10-CM | POA: Diagnosis not present

## 2023-01-03 DIAGNOSIS — Z882 Allergy status to sulfonamides status: Secondary | ICD-10-CM | POA: Diagnosis not present

## 2023-01-03 DIAGNOSIS — R0902 Hypoxemia: Secondary | ICD-10-CM | POA: Diagnosis not present

## 2023-01-03 DIAGNOSIS — R0602 Shortness of breath: Secondary | ICD-10-CM | POA: Diagnosis not present

## 2023-01-03 DIAGNOSIS — I10 Essential (primary) hypertension: Secondary | ICD-10-CM | POA: Diagnosis not present

## 2023-01-03 DIAGNOSIS — Z79899 Other long term (current) drug therapy: Secondary | ICD-10-CM | POA: Diagnosis not present

## 2023-01-03 DIAGNOSIS — Z1152 Encounter for screening for COVID-19: Secondary | ICD-10-CM | POA: Diagnosis not present

## 2023-01-03 DIAGNOSIS — R410 Disorientation, unspecified: Secondary | ICD-10-CM | POA: Diagnosis not present

## 2023-01-04 DIAGNOSIS — M25562 Pain in left knee: Secondary | ICD-10-CM | POA: Diagnosis not present

## 2023-01-04 DIAGNOSIS — R4182 Altered mental status, unspecified: Secondary | ICD-10-CM | POA: Diagnosis not present

## 2023-01-04 DIAGNOSIS — M25561 Pain in right knee: Secondary | ICD-10-CM | POA: Diagnosis not present

## 2023-01-05 DIAGNOSIS — E119 Type 2 diabetes mellitus without complications: Secondary | ICD-10-CM | POA: Diagnosis not present

## 2023-01-05 DIAGNOSIS — D649 Anemia, unspecified: Secondary | ICD-10-CM | POA: Diagnosis not present

## 2023-01-05 DIAGNOSIS — I10 Essential (primary) hypertension: Secondary | ICD-10-CM | POA: Diagnosis not present

## 2023-01-05 DIAGNOSIS — N390 Urinary tract infection, site not specified: Secondary | ICD-10-CM | POA: Diagnosis not present

## 2023-01-05 DIAGNOSIS — F339 Major depressive disorder, recurrent, unspecified: Secondary | ICD-10-CM | POA: Diagnosis not present

## 2023-01-05 DIAGNOSIS — M6281 Muscle weakness (generalized): Secondary | ICD-10-CM | POA: Diagnosis not present

## 2023-01-05 DIAGNOSIS — F419 Anxiety disorder, unspecified: Secondary | ICD-10-CM | POA: Diagnosis not present

## 2023-01-05 DIAGNOSIS — L89132 Pressure ulcer of right lower back, stage 2: Secondary | ICD-10-CM | POA: Diagnosis not present

## 2023-01-07 DIAGNOSIS — I10 Essential (primary) hypertension: Secondary | ICD-10-CM | POA: Diagnosis not present

## 2023-01-07 DIAGNOSIS — N390 Urinary tract infection, site not specified: Secondary | ICD-10-CM | POA: Diagnosis not present

## 2023-01-08 ENCOUNTER — Other Ambulatory Visit: Payer: Self-pay

## 2023-01-08 ENCOUNTER — Encounter (HOSPITAL_COMMUNITY): Payer: Self-pay

## 2023-01-08 ENCOUNTER — Emergency Department (HOSPITAL_COMMUNITY): Payer: Medicare HMO

## 2023-01-08 ENCOUNTER — Inpatient Hospital Stay (HOSPITAL_COMMUNITY)
Admission: EM | Admit: 2023-01-08 | Discharge: 2023-02-04 | DRG: 987 | Disposition: A | Discharge status: Skilled Nursing Facility | Payer: Medicare HMO | Source: Skilled Nursing Facility | Attending: Internal Medicine | Admitting: Internal Medicine

## 2023-01-08 ENCOUNTER — Inpatient Hospital Stay (HOSPITAL_COMMUNITY): Payer: Medicare HMO

## 2023-01-08 DIAGNOSIS — F32A Depression, unspecified: Secondary | ICD-10-CM | POA: Diagnosis not present

## 2023-01-08 DIAGNOSIS — F05 Delirium due to known physiological condition: Secondary | ICD-10-CM | POA: Diagnosis not present

## 2023-01-08 DIAGNOSIS — M6281 Muscle weakness (generalized): Secondary | ICD-10-CM | POA: Diagnosis not present

## 2023-01-08 DIAGNOSIS — R413 Other amnesia: Secondary | ICD-10-CM | POA: Diagnosis not present

## 2023-01-08 DIAGNOSIS — E785 Hyperlipidemia, unspecified: Secondary | ICD-10-CM | POA: Diagnosis not present

## 2023-01-08 DIAGNOSIS — N3289 Other specified disorders of bladder: Secondary | ICD-10-CM | POA: Diagnosis not present

## 2023-01-08 DIAGNOSIS — Z431 Encounter for attention to gastrostomy: Secondary | ICD-10-CM | POA: Diagnosis not present

## 2023-01-08 DIAGNOSIS — I2510 Atherosclerotic heart disease of native coronary artery without angina pectoris: Secondary | ICD-10-CM | POA: Diagnosis not present

## 2023-01-08 DIAGNOSIS — G9341 Metabolic encephalopathy: Secondary | ICD-10-CM | POA: Diagnosis present

## 2023-01-08 DIAGNOSIS — K828 Other specified diseases of gallbladder: Secondary | ICD-10-CM | POA: Diagnosis not present

## 2023-01-08 DIAGNOSIS — K209 Esophagitis, unspecified without bleeding: Secondary | ICD-10-CM | POA: Diagnosis not present

## 2023-01-08 DIAGNOSIS — R1319 Other dysphagia: Secondary | ICD-10-CM

## 2023-01-08 DIAGNOSIS — N281 Cyst of kidney, acquired: Secondary | ICD-10-CM | POA: Diagnosis not present

## 2023-01-08 DIAGNOSIS — Z882 Allergy status to sulfonamides status: Secondary | ICD-10-CM

## 2023-01-08 DIAGNOSIS — Z91410 Personal history of adult physical and sexual abuse: Secondary | ICD-10-CM

## 2023-01-08 DIAGNOSIS — I10 Essential (primary) hypertension: Secondary | ICD-10-CM | POA: Diagnosis present

## 2023-01-08 DIAGNOSIS — Z66 Do not resuscitate: Secondary | ICD-10-CM | POA: Diagnosis present

## 2023-01-08 DIAGNOSIS — L89152 Pressure ulcer of sacral region, stage 2: Secondary | ICD-10-CM | POA: Diagnosis present

## 2023-01-08 DIAGNOSIS — E162 Hypoglycemia, unspecified: Secondary | ICD-10-CM | POA: Diagnosis not present

## 2023-01-08 DIAGNOSIS — R1011 Right upper quadrant pain: Secondary | ICD-10-CM | POA: Diagnosis not present

## 2023-01-08 DIAGNOSIS — G4700 Insomnia, unspecified: Secondary | ICD-10-CM | POA: Diagnosis present

## 2023-01-08 DIAGNOSIS — J9811 Atelectasis: Secondary | ICD-10-CM | POA: Diagnosis not present

## 2023-01-08 DIAGNOSIS — Z90711 Acquired absence of uterus with remaining cervical stump: Secondary | ICD-10-CM

## 2023-01-08 DIAGNOSIS — E870 Hyperosmolality and hypernatremia: Secondary | ICD-10-CM | POA: Diagnosis present

## 2023-01-08 DIAGNOSIS — Z515 Encounter for palliative care: Secondary | ICD-10-CM | POA: Diagnosis not present

## 2023-01-08 DIAGNOSIS — R54 Age-related physical debility: Secondary | ICD-10-CM | POA: Diagnosis present

## 2023-01-08 DIAGNOSIS — R1310 Dysphagia, unspecified: Secondary | ICD-10-CM | POA: Diagnosis not present

## 2023-01-08 DIAGNOSIS — R0602 Shortness of breath: Secondary | ICD-10-CM | POA: Diagnosis not present

## 2023-01-08 DIAGNOSIS — R7303 Prediabetes: Secondary | ICD-10-CM | POA: Diagnosis present

## 2023-01-08 DIAGNOSIS — R109 Unspecified abdominal pain: Secondary | ICD-10-CM | POA: Diagnosis present

## 2023-01-08 DIAGNOSIS — Z7189 Other specified counseling: Secondary | ICD-10-CM | POA: Diagnosis not present

## 2023-01-08 DIAGNOSIS — Z813 Family history of other psychoactive substance abuse and dependence: Secondary | ICD-10-CM

## 2023-01-08 DIAGNOSIS — D631 Anemia in chronic kidney disease: Secondary | ICD-10-CM | POA: Diagnosis present

## 2023-01-08 DIAGNOSIS — Z452 Encounter for adjustment and management of vascular access device: Secondary | ICD-10-CM | POA: Diagnosis not present

## 2023-01-08 DIAGNOSIS — E872 Acidosis, unspecified: Secondary | ICD-10-CM | POA: Diagnosis present

## 2023-01-08 DIAGNOSIS — Z818 Family history of other mental and behavioral disorders: Secondary | ICD-10-CM

## 2023-01-08 DIAGNOSIS — K8010 Calculus of gallbladder with chronic cholecystitis without obstruction: Secondary | ICD-10-CM | POA: Diagnosis present

## 2023-01-08 DIAGNOSIS — R1314 Dysphagia, pharyngoesophageal phase: Secondary | ICD-10-CM | POA: Diagnosis not present

## 2023-01-08 DIAGNOSIS — Z1152 Encounter for screening for COVID-19: Secondary | ICD-10-CM | POA: Diagnosis not present

## 2023-01-08 DIAGNOSIS — Z8719 Personal history of other diseases of the digestive system: Secondary | ICD-10-CM | POA: Diagnosis not present

## 2023-01-08 DIAGNOSIS — I129 Hypertensive chronic kidney disease with stage 1 through stage 4 chronic kidney disease, or unspecified chronic kidney disease: Secondary | ICD-10-CM | POA: Diagnosis present

## 2023-01-08 DIAGNOSIS — Z79899 Other long term (current) drug therapy: Secondary | ICD-10-CM

## 2023-01-08 DIAGNOSIS — K219 Gastro-esophageal reflux disease without esophagitis: Secondary | ICD-10-CM | POA: Diagnosis present

## 2023-01-08 DIAGNOSIS — E876 Hypokalemia: Secondary | ICD-10-CM | POA: Diagnosis present

## 2023-01-08 DIAGNOSIS — F329 Major depressive disorder, single episode, unspecified: Secondary | ICD-10-CM | POA: Diagnosis present

## 2023-01-08 DIAGNOSIS — Z860100 Personal history of colon polyps, unspecified: Secondary | ICD-10-CM

## 2023-01-08 DIAGNOSIS — E860 Dehydration: Secondary | ICD-10-CM | POA: Diagnosis not present

## 2023-01-08 DIAGNOSIS — E871 Hypo-osmolality and hyponatremia: Secondary | ICD-10-CM | POA: Diagnosis present

## 2023-01-08 DIAGNOSIS — R2689 Other abnormalities of gait and mobility: Secondary | ICD-10-CM | POA: Diagnosis not present

## 2023-01-08 DIAGNOSIS — R933 Abnormal findings on diagnostic imaging of other parts of digestive tract: Secondary | ICD-10-CM | POA: Diagnosis not present

## 2023-01-08 DIAGNOSIS — R4182 Altered mental status, unspecified: Secondary | ICD-10-CM | POA: Diagnosis present

## 2023-01-08 DIAGNOSIS — Z833 Family history of diabetes mellitus: Secondary | ICD-10-CM

## 2023-01-08 DIAGNOSIS — N3946 Mixed incontinence: Secondary | ICD-10-CM | POA: Diagnosis present

## 2023-01-08 DIAGNOSIS — Q396 Congenital diverticulum of esophagus: Secondary | ICD-10-CM | POA: Diagnosis not present

## 2023-01-08 DIAGNOSIS — Z811 Family history of alcohol abuse and dependence: Secondary | ICD-10-CM

## 2023-01-08 DIAGNOSIS — F29 Unspecified psychosis not due to a substance or known physiological condition: Secondary | ICD-10-CM | POA: Diagnosis not present

## 2023-01-08 DIAGNOSIS — M109 Gout, unspecified: Secondary | ICD-10-CM | POA: Diagnosis present

## 2023-01-08 DIAGNOSIS — M1990 Unspecified osteoarthritis, unspecified site: Secondary | ICD-10-CM | POA: Diagnosis present

## 2023-01-08 DIAGNOSIS — N179 Acute kidney failure, unspecified: Secondary | ICD-10-CM | POA: Diagnosis present

## 2023-01-08 DIAGNOSIS — B9620 Unspecified Escherichia coli [E. coli] as the cause of diseases classified elsewhere: Secondary | ICD-10-CM | POA: Diagnosis not present

## 2023-01-08 DIAGNOSIS — R634 Abnormal weight loss: Secondary | ICD-10-CM | POA: Diagnosis not present

## 2023-01-08 DIAGNOSIS — Z8249 Family history of ischemic heart disease and other diseases of the circulatory system: Secondary | ICD-10-CM

## 2023-01-08 DIAGNOSIS — K224 Dyskinesia of esophagus: Secondary | ICD-10-CM | POA: Diagnosis present

## 2023-01-08 DIAGNOSIS — R0989 Other specified symptoms and signs involving the circulatory and respiratory systems: Secondary | ICD-10-CM | POA: Diagnosis not present

## 2023-01-08 DIAGNOSIS — R1312 Dysphagia, oropharyngeal phase: Secondary | ICD-10-CM | POA: Diagnosis present

## 2023-01-08 DIAGNOSIS — Z7401 Bed confinement status: Secondary | ICD-10-CM | POA: Diagnosis not present

## 2023-01-08 DIAGNOSIS — F411 Generalized anxiety disorder: Secondary | ICD-10-CM | POA: Diagnosis not present

## 2023-01-08 DIAGNOSIS — R531 Weakness: Secondary | ICD-10-CM | POA: Diagnosis not present

## 2023-01-08 DIAGNOSIS — L8990 Pressure ulcer of unspecified site, unspecified stage: Secondary | ICD-10-CM | POA: Insufficient documentation

## 2023-01-08 DIAGNOSIS — Z0189 Encounter for other specified special examinations: Secondary | ICD-10-CM | POA: Diagnosis not present

## 2023-01-08 DIAGNOSIS — K760 Fatty (change of) liver, not elsewhere classified: Secondary | ICD-10-CM | POA: Diagnosis present

## 2023-01-08 DIAGNOSIS — R404 Transient alteration of awareness: Secondary | ICD-10-CM | POA: Diagnosis not present

## 2023-01-08 DIAGNOSIS — R627 Adult failure to thrive: Secondary | ICD-10-CM | POA: Diagnosis present

## 2023-01-08 DIAGNOSIS — E11649 Type 2 diabetes mellitus with hypoglycemia without coma: Secondary | ICD-10-CM | POA: Diagnosis not present

## 2023-01-08 DIAGNOSIS — D638 Anemia in other chronic diseases classified elsewhere: Secondary | ICD-10-CM | POA: Diagnosis present

## 2023-01-08 DIAGNOSIS — K8020 Calculus of gallbladder without cholecystitis without obstruction: Secondary | ICD-10-CM | POA: Diagnosis not present

## 2023-01-08 DIAGNOSIS — Z8261 Family history of arthritis: Secondary | ICD-10-CM

## 2023-01-08 DIAGNOSIS — Z888 Allergy status to other drugs, medicaments and biological substances status: Secondary | ICD-10-CM

## 2023-01-08 DIAGNOSIS — L89611 Pressure ulcer of right heel, stage 1: Secondary | ICD-10-CM | POA: Diagnosis present

## 2023-01-08 DIAGNOSIS — G4733 Obstructive sleep apnea (adult) (pediatric): Secondary | ICD-10-CM

## 2023-01-08 DIAGNOSIS — N390 Urinary tract infection, site not specified: Secondary | ICD-10-CM | POA: Diagnosis not present

## 2023-01-08 DIAGNOSIS — R269 Unspecified abnormalities of gait and mobility: Secondary | ICD-10-CM | POA: Diagnosis not present

## 2023-01-08 DIAGNOSIS — L24A2 Irritant contact dermatitis due to fecal, urinary or dual incontinence: Secondary | ICD-10-CM | POA: Diagnosis present

## 2023-01-08 DIAGNOSIS — L89621 Pressure ulcer of left heel, stage 1: Secondary | ICD-10-CM | POA: Diagnosis present

## 2023-01-08 DIAGNOSIS — Z6841 Body Mass Index (BMI) 40.0 and over, adult: Secondary | ICD-10-CM

## 2023-01-08 DIAGNOSIS — Z1612 Extended spectrum beta lactamase (ESBL) resistance: Secondary | ICD-10-CM | POA: Diagnosis present

## 2023-01-08 DIAGNOSIS — Z825 Family history of asthma and other chronic lower respiratory diseases: Secondary | ICD-10-CM

## 2023-01-08 DIAGNOSIS — K838 Other specified diseases of biliary tract: Secondary | ICD-10-CM | POA: Diagnosis not present

## 2023-01-08 DIAGNOSIS — I517 Cardiomegaly: Secondary | ICD-10-CM | POA: Diagnosis not present

## 2023-01-08 DIAGNOSIS — Z9151 Personal history of suicidal behavior: Secondary | ICD-10-CM

## 2023-01-08 DIAGNOSIS — K225 Diverticulum of esophagus, acquired: Secondary | ICD-10-CM | POA: Diagnosis not present

## 2023-01-08 DIAGNOSIS — Z87891 Personal history of nicotine dependence: Secondary | ICD-10-CM

## 2023-01-08 DIAGNOSIS — K2100 Gastro-esophageal reflux disease with esophagitis, without bleeding: Secondary | ICD-10-CM | POA: Diagnosis not present

## 2023-01-08 DIAGNOSIS — R9082 White matter disease, unspecified: Secondary | ICD-10-CM | POA: Diagnosis not present

## 2023-01-08 DIAGNOSIS — K811 Chronic cholecystitis: Secondary | ICD-10-CM | POA: Diagnosis not present

## 2023-01-08 DIAGNOSIS — Z743 Need for continuous supervision: Secondary | ICD-10-CM | POA: Diagnosis not present

## 2023-01-08 DIAGNOSIS — Z9884 Bariatric surgery status: Secondary | ICD-10-CM

## 2023-01-08 DIAGNOSIS — R6339 Other feeding difficulties: Secondary | ICD-10-CM | POA: Diagnosis not present

## 2023-01-08 LAB — CBC WITH DIFFERENTIAL/PLATELET
Abs Immature Granulocytes: 0.07 10*3/uL (ref 0.00–0.07)
Basophils Absolute: 0 10*3/uL (ref 0.0–0.1)
Basophils Relative: 1 %
Eosinophils Absolute: 0.1 10*3/uL (ref 0.0–0.5)
Eosinophils Relative: 1 %
HCT: 38.7 % (ref 36.0–46.0)
Hemoglobin: 11.5 g/dL — ABNORMAL LOW (ref 12.0–15.0)
Immature Granulocytes: 1 %
Lymphocytes Relative: 34 %
Lymphs Abs: 2.5 10*3/uL (ref 0.7–4.0)
MCH: 26.3 pg (ref 26.0–34.0)
MCHC: 29.7 g/dL — ABNORMAL LOW (ref 30.0–36.0)
MCV: 88.4 fL (ref 80.0–100.0)
Monocytes Absolute: 0.6 10*3/uL (ref 0.1–1.0)
Monocytes Relative: 8 %
Neutro Abs: 4.1 10*3/uL (ref 1.7–7.7)
Neutrophils Relative %: 55 %
Platelets: 273 10*3/uL (ref 150–400)
RBC: 4.38 MIL/uL (ref 3.87–5.11)
RDW: 22.2 % — ABNORMAL HIGH (ref 11.5–15.5)
WBC: 7.4 10*3/uL (ref 4.0–10.5)
nRBC: 0.4 % — ABNORMAL HIGH (ref 0.0–0.2)

## 2023-01-08 LAB — C DIFFICILE QUICK SCREEN W PCR REFLEX
C Diff antigen: NEGATIVE
C Diff interpretation: NOT DETECTED
C Diff toxin: NEGATIVE

## 2023-01-08 LAB — COMPREHENSIVE METABOLIC PANEL
ALT: 34 U/L (ref 0–44)
AST: 39 U/L (ref 15–41)
Albumin: 3.2 g/dL — ABNORMAL LOW (ref 3.5–5.0)
Alkaline Phosphatase: 287 U/L — ABNORMAL HIGH (ref 38–126)
Anion gap: 16 — ABNORMAL HIGH (ref 5–15)
BUN: 76 mg/dL — ABNORMAL HIGH (ref 8–23)
CO2: 20 mmol/L — ABNORMAL LOW (ref 22–32)
Calcium: 9.7 mg/dL (ref 8.9–10.3)
Chloride: 121 mmol/L — ABNORMAL HIGH (ref 98–111)
Creatinine, Ser: 1.66 mg/dL — ABNORMAL HIGH (ref 0.44–1.00)
GFR, Estimated: 33 mL/min — ABNORMAL LOW (ref >60)
Glucose, Bld: 97 mg/dL (ref 70–99)
Potassium: 3.9 mmol/L (ref 3.5–5.1)
Sodium: 157 mmol/L — ABNORMAL HIGH (ref 135–145)
Total Bilirubin: 1 mg/dL (ref 0.3–1.2)
Total Protein: 7.7 g/dL (ref 6.5–8.1)

## 2023-01-08 LAB — MRSA NEXT GEN BY PCR, NASAL: MRSA by PCR Next Gen: NOT DETECTED

## 2023-01-08 LAB — FOLATE: Folate: 11.5 ng/mL (ref >5.9)

## 2023-01-08 LAB — CBG MONITORING, ED
Glucose-Capillary: 65 mg/dL — ABNORMAL LOW (ref 70–99)
Glucose-Capillary: 122 mg/dL — ABNORMAL HIGH (ref 70–99)

## 2023-01-08 LAB — AMMONIA: Ammonia: 11 umol/L (ref 9–35)

## 2023-01-08 LAB — TSH: TSH: 1.715 u[IU]/mL (ref 0.350–4.500)

## 2023-01-08 LAB — LACTIC ACID, PLASMA: Lactic Acid, Venous: 1.8 mmol/L (ref 0.5–1.9)

## 2023-01-08 LAB — SARS CORONAVIRUS 2 BY RT PCR: SARS Coronavirus 2 by RT PCR: NEGATIVE

## 2023-01-08 LAB — VITAMIN D 25 HYDROXY (VIT D DEFICIENCY, FRACTURES): Vit D, 25-Hydroxy: 29.9 ng/mL — ABNORMAL LOW (ref 30–100)

## 2023-01-08 MED ORDER — OXYCODONE HCL 5 MG PO TABS
5.0000 mg | ORAL_TABLET | ORAL | Status: DC | PRN
  Administered 2023-01-11 – 2023-01-23 (×8): 5 mg via ORAL
  Filled 2023-01-08 (×15): qty 1

## 2023-01-08 MED ORDER — OXYBUTYNIN CHLORIDE ER 5 MG PO TB24
15.0000 mg | ORAL_TABLET | Freq: Every day | ORAL | Status: DC
  Administered 2023-01-09 – 2023-01-31 (×19): 15 mg via ORAL
  Filled 2023-01-08: qty 1
  Filled 2023-01-08 (×3): qty 3
  Filled 2023-01-08 (×5): qty 1
  Filled 2023-01-08 (×4): qty 3
  Filled 2023-01-08 (×3): qty 1
  Filled 2023-01-08: qty 3
  Filled 2023-01-08 (×3): qty 1
  Filled 2023-01-08: qty 3
  Filled 2023-01-08 (×2): qty 1

## 2023-01-08 MED ORDER — ENOXAPARIN SODIUM 40 MG/0.4ML IJ SOSY
40.0000 mg | PREFILLED_SYRINGE | INTRAMUSCULAR | Status: DC
  Administered 2023-01-08 – 2023-02-03 (×26): 40 mg via SUBCUTANEOUS
  Filled 2023-01-08 (×27): qty 0.4

## 2023-01-08 MED ORDER — ADULT MULTIVITAMIN W/MINERALS CH
1.0000 | ORAL_TABLET | Freq: Every day | ORAL | Status: DC
  Administered 2023-01-10: 1 via ORAL
  Filled 2023-01-08 (×2): qty 1

## 2023-01-08 MED ORDER — DULOXETINE HCL 60 MG PO CPEP
60.0000 mg | ORAL_CAPSULE | Freq: Every day | ORAL | Status: DC
  Administered 2023-01-10 – 2023-02-04 (×22): 60 mg via ORAL
  Filled 2023-01-08 (×27): qty 1

## 2023-01-08 MED ORDER — ONDANSETRON HCL 4 MG PO TABS: 4.0000 mg | ORAL_TABLET | Freq: Four times a day (QID) | ORAL | Status: DC | PRN

## 2023-01-08 MED ORDER — ZINC SULFATE 220 (50 ZN) MG PO CAPS
220.0000 mg | ORAL_CAPSULE | Freq: Every day | ORAL | Status: DC
  Administered 2023-01-10 – 2023-01-12 (×3): 220 mg via ORAL
  Filled 2023-01-08 (×4): qty 1

## 2023-01-08 MED ORDER — COLCHICINE 0.6 MG PO TABS
0.6000 mg | ORAL_TABLET | Freq: Every day | ORAL | Status: DC
  Administered 2023-01-09 – 2023-01-25 (×13): 0.6 mg via ORAL
  Filled 2023-01-08 (×17): qty 1

## 2023-01-08 MED ORDER — PROSOURCE PLUS PO LIQD
30.0000 mL | Freq: Two times a day (BID) | ORAL | Status: DC
  Administered 2023-01-10 (×2): 30 mL via ORAL
  Filled 2023-01-08 (×9): qty 30

## 2023-01-08 MED ORDER — FENTANYL CITRATE PF 50 MCG/ML IJ SOSY
25.0000 ug | PREFILLED_SYRINGE | INTRAMUSCULAR | Status: DC | PRN
  Administered 2023-01-08 – 2023-01-15 (×3): 25 ug via INTRAVENOUS
  Filled 2023-01-08 (×4): qty 1

## 2023-01-08 MED ORDER — ACETAMINOPHEN 325 MG PO TABS
650.0000 mg | ORAL_TABLET | Freq: Four times a day (QID) | ORAL | Status: DC | PRN
  Administered 2023-01-10 – 2023-01-27 (×4): 650 mg via ORAL
  Filled 2023-01-08 (×4): qty 2

## 2023-01-08 MED ORDER — SODIUM CHLORIDE 0.9 % IV SOLN: Freq: Once | INTRAVENOUS | Status: AC

## 2023-01-08 MED ORDER — ONDANSETRON HCL 4 MG/2ML IJ SOLN
4.0000 mg | Freq: Four times a day (QID) | INTRAMUSCULAR | Status: DC | PRN
  Filled 2023-01-08: qty 2

## 2023-01-08 MED ORDER — DOCUSATE SODIUM 100 MG PO CAPS
100.0000 mg | ORAL_CAPSULE | Freq: Two times a day (BID) | ORAL | Status: DC
  Administered 2023-01-09 – 2023-01-12 (×6): 100 mg via ORAL
  Filled 2023-01-08 (×7): qty 1

## 2023-01-08 MED ORDER — TRAZODONE HCL 50 MG PO TABS
50.0000 mg | ORAL_TABLET | Freq: Every evening | ORAL | Status: DC | PRN
  Administered 2023-01-09 – 2023-01-14 (×3): 50 mg via ORAL
  Filled 2023-01-08 (×5): qty 1

## 2023-01-08 MED ORDER — ORAL CARE MOUTH RINSE: 15.0000 mL | OROMUCOSAL | Status: DC | PRN

## 2023-01-08 MED ORDER — FOLIC ACID 1 MG PO TABS
1.0000 mg | ORAL_TABLET | Freq: Every day | ORAL | Status: DC
  Administered 2023-01-10 – 2023-01-12 (×3): 1 mg via ORAL
  Filled 2023-01-08 (×4): qty 1

## 2023-01-08 MED ORDER — ORAL CARE MOUTH RINSE
15.0000 mL | OROMUCOSAL | Status: DC
  Administered 2023-01-09 – 2023-01-12 (×10): 15 mL via OROMUCOSAL

## 2023-01-08 MED ORDER — DEXTROSE 5 % IV SOLN: INTRAVENOUS | Status: DC

## 2023-01-08 MED ORDER — SENNA 8.6 MG PO TABS
1.0000 | ORAL_TABLET | Freq: Every day | ORAL | Status: DC
  Administered 2023-01-10 – 2023-01-11 (×2): 8.6 mg via ORAL
  Filled 2023-01-08 (×3): qty 1

## 2023-01-08 MED ORDER — PROMOD PO LIQD: 30.0000 mL | Freq: Two times a day (BID) | ORAL | Status: DC

## 2023-01-08 MED ORDER — VITAMIN C 500 MG PO TABS
500.0000 mg | ORAL_TABLET | Freq: Every day | ORAL | Status: DC
  Administered 2023-01-09 – 2023-01-12 (×4): 500 mg via ORAL
  Filled 2023-01-08 (×4): qty 1

## 2023-01-08 MED ORDER — PANTOPRAZOLE SODIUM 40 MG IV SOLR
40.0000 mg | INTRAVENOUS | Status: DC
  Administered 2023-01-08 – 2023-01-12 (×4): 40 mg via INTRAVENOUS
  Filled 2023-01-08 (×5): qty 10

## 2023-01-08 MED ORDER — BIOTENE DRY MOUTH MT LIQD
15.0000 mL | Freq: Two times a day (BID) | OROMUCOSAL | Status: DC
  Administered 2023-01-12 – 2023-02-04 (×37): 15 mL via OROMUCOSAL

## 2023-01-08 MED ORDER — ZINC GLUCONATE 50 MG PO TABS: 50.0000 mg | ORAL_TABLET | Freq: Every day | ORAL | Status: DC

## 2023-01-08 MED ORDER — ACETAMINOPHEN 650 MG RE SUPP
650.0000 mg | Freq: Four times a day (QID) | RECTAL | Status: DC | PRN
  Administered 2023-01-15: 650 mg via RECTAL
  Filled 2023-01-08 (×3): qty 1

## 2023-01-08 MED ORDER — DICLOFENAC SODIUM 1 % EX GEL: 2.0000 g | Freq: Four times a day (QID) | CUTANEOUS | Status: DC | PRN

## 2023-01-08 MED ORDER — FERROUS SULFATE 325 (65 FE) MG PO TABS
325.0000 mg | ORAL_TABLET | Freq: Every day | ORAL | Status: DC
  Administered 2023-01-10 – 2023-02-04 (×22): 325 mg via ORAL
  Filled 2023-01-08 (×28): qty 1

## 2023-01-08 MED ORDER — DEXTROSE 50 % IV SOLN
1.0000 | Freq: Once | INTRAVENOUS | Status: AC
  Administered 2023-01-08: 50 mL via INTRAVENOUS
  Filled 2023-01-08: qty 50

## 2023-01-08 MED ORDER — SODIUM CHLORIDE 0.9 % IV BOLUS
1000.0000 mL | Freq: Once | INTRAVENOUS | Status: AC
  Administered 2023-01-08: 1000 mL via INTRAVENOUS

## 2023-01-08 NOTE — ED Notes (Signed)
IVF infusing, dextrose given, pt alert, NAD, calm, intermittent moaning. Oriented to name, follows commands. Pt to CT.

## 2023-01-08 NOTE — Progress Notes (Signed)
Noted to have multiple pressure injuries on initial assessment. Noted on flowsheet with Atilano Ina, RN as second skin assessment verifier.  Pressure injuries noted: right buttocks: 2.5 x 5 cm stage 2, 2 x 10 cm (appears to be healed stage 2), 2 x 5 cm stage 3, 2 x 1 cm stage 3; left inner thigh: 0.5 x 0.5 cm stage 2; coccyx: 2 x 0.5 cm stage 2; left buttocks: 2x1 cm stage 2, 1x1 stage 2s x 5 separate areas, 3 x 2 stage 2, 0.5 x 0.5 stage 2, pinpoint stage 2 to left upper buttock.  Patient noted to have poor dental hygiene, thick dental plaque coating all visible teeth, dry mucous membranes, nurse techs attempted oral care but patient has been uncooperative and increasingly agitated with any type of care provided. Arrived soiled with urinary and fecal incontinence and was screaming out during cleansing and wound care. Nursing staff attempted to calm her and provided explanation prior to any care provided to attempt to decrease her agitation and/or fear during care.  All wounds noted assessed, cleansed with saline, appropriate pericare provided, linens and gown changed, bedalarm on for safety. Call light within reach, relaxation channel on, fall mat in place as well for safety. Notified Dr Laural Benes of patient wounds present.

## 2023-01-08 NOTE — Hospital Course (Addendum)
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 Dossie Der.  She has been progressively declining since surgery.  She has had multiple hospitalizations and ED visits in past several months.  Recently seen at [facility] on 01/03/23 with complaints of weakness and poor oral intake.  She was hospitalized in July and August of this year at different facilities for dehydration, weakness, acute renal failure and failure to thrive.  She has been residing in a SNF.  She apparently has not been eating or drinking and refusing to take her medications.  She was sent to ER today for evaluation of altered mentation.  She has had 3 days of progressive weakness and refusing oral intake.  She complains of pain all over body which is not new per record review. Pt is a poor historian.   She was found to be severely dehydrated with hypoglycemia and hypernatremic with a sodium of 157.  She also had acute kidney injury and distended gallbladder on CT scan.  CT head with no acute findings.  Pt was started on IV hydration and admission was requested.     The patient was started on IV hypotonic fluid with D5W with improvement of her electrolytes.  Her hypoglycemia improved on D5W.  Her electrolytes were optimized.  Her renal function gradually improved/stabilized.  In addition, the patient's mental status improved although she continues to have delayed responses.  Her hospitalization has been prolonged secondary to her deconditioning and patient's refusal for oral intake.  In addition, the patient continues to have failure to thrive.  She continues to have very poor oral intake.  On numerous occasions, the patient with " pocket" pills and food in her mouth and not swallow.  She continued to show delayed swallowing.  Speech therapy was consulted and did not feel that there was a pharyngeal etiology of the patient's  swallowing.  Esophagram showed esophageal dysmotility.  GI was consulted, but did not feel that there was a physiologic cause prohibiting the patient from swallowing.  Numerous discussions were held with the patient and family regarding the patient's lack of desire to eat and swallow her nutrition and pills.  Each day, nursing staff it has been an inordinate amount of time encouraging the patient.  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  GI was consulted to assist.  CT chest did not show any mass causing extrinsic compression on esophagus.  Again, it was felt there was no medically prohibitive reason why patient cannot swallow.  I had multiple discussions with the patient's brother and sister who are the patient's main advocates.  After numerous discussions, the patient and brother, and sister agreed to pursue gastrostomy tube placement.  Palliative medicine was consulted to discuss GOC. I spoke with general surgery, Dr. Dossie Der, who agreed to consult for possible gastrostomy tube placement.

## 2023-01-08 NOTE — H&P (Addendum)
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 Dossie Der.  She has been progressively declining since surgery.  She has had multiple hospitalizations and ED visits in past several months.  Recently seen at [facility] on 01/03/23 with complaints of weakness and poor oral intake.  She was hospitalized in July and August of this year at different facilities for dehydration, weakness, acute renal failure and failure to thrive.  She has been residing in a SNF.  She apparently has not been eating or drinking and refusing to take her medications.  She was sent to ER today for evaluation of altered mentation.  She has had 3 days of progressive weakness and refusing oral intake.  She complains of pain all over body which is not new per record review. Pt is a poor historian.   She was found to be severely dehydrated with hypoglycemia and hypernatremic with a sodium of 157.  She also had acute kidney injury and distended gallbladder on CT scan.  CT head with no acute findings.  Pt was started on IV hydration and admission was requested.       Past Medical History:  Diagnosis Date   Allergy    dust, pollen, sulfa, prednisone   Anemia    Anxiety    Arthritis    "knees" (04/26/2016)   Colon polyps    benign per pt   Coronary artery disease    mild per 2015 cath in [place] (OM1 30%, RCA 30%)   GERD (gastroesophageal reflux disease)    Gout    History of hiatal hernia    Hyperlipidemia 05/20/2021   Hypertension    Migraine     "none since early /2017" (05/06/2016)   Obesity    OSA on CPAP    uses CPAP   Pre-diabetes    Pre-operative cardiovascular examination 08/22/2008   Renal insufficiency 11/09/2022   Vasculitis (HCC)    Bilateral    Past Surgical History:  Procedure Laterality Date   ABDOMINAL HYSTERECTOMY     "partial"; both ovaries present   APPENDECTOMY  05/06/2016   BIOPSY  04/08/2022   Procedure: BIOPSY;  Surgeon: Sherrilyn Rist, MD;  Location: [facility];  Service: Gastroenterology;;   CARDIAC CATHETERIZATION  ~ 2015   CARDIAC CATHETERIZATION  2015   In [place]   CHILECTOMY Right 06/01/2017   Procedure: CHILECTOMY RIGHT FOOT;  Surgeon: Felecia Shelling, DPM;  Location: [facility] OR;  Service: Podiatry;  Laterality: Right;   ESOPHAGOGASTRODUODENOSCOPY (EGD) WITH PROPOFOL N/A 04/08/2022   Procedure: ESOPHAGOGASTRODUODENOSCOPY (EGD) WITH PROPOFOL;  Surgeon: Sherrilyn Rist, MD;  Location: [facility];  Service: Gastroenterology;  Laterality: N/A;   LAPAROSCOPIC APPENDECTOMY N/A 05/06/2016   Procedure: LAPAROSCOPIC APPENDECTOMY;  Surgeon: Manus Rudd, MD;  Location: [facility] OR;  Service: General;  Laterality: N/A;   POLYPECTOMY  04/08/2022   Procedure: POLYPECTOMY;  Surgeon: Sherrilyn Rist, MD;  Location: [facility];  Service: Gastroenterology;;   TONSILLECTOMY       reports that she quit smoking about 44 years ago. Her smoking  use included cigarettes. She started smoking about 49 years ago. She has a 0.6 pack-year smoking history. She has never used smokeless tobacco. She reports that she does not drink alcohol and does not use drugs.  Allergies  Allergen Reactions   Prednisone Swelling    Legs swell    Tape Hives   Sulfa Antibiotics Rash    Family History  Problem Relation Age of Onset   Diabetes Mother    Heart disease Mother    High blood pressure Mother    Asthma Mother    Arthritis Mother    Diabetes Father    Heart disease Father    High blood pressure Father    Asthma  Father    Diabetes Sister    Alcohol abuse Brother    Drug abuse Brother    Arthritis Sister    Diabetes Sister    Varicose Veins Sister    Colon cancer Neg Hx    Esophageal cancer Neg Hx    Rectal cancer Neg Hx     Prior to Admission medications   Medication Sig Start Date End Date Taking? Authorizing Provider  DULoxetine (CYMBALTA) 60 MG capsule Take 1 capsule (60 mg total) by mouth daily. 12/25/21  Yes [name], DO  oxybutynin (DITROPAN XL) 15 MG 24 hr tablet TAKE 1 TABLET BY MOUTH EVERY NIGHT AT BEDTIME 12/30/22  Yes Wendling, Jilda Roche, DO  pantoprazole (PROTONIX) 40 MG tablet Take 1 tablet (40 mg total) by mouth daily. 09/15/22  Yes Stechschulte, Hyman Hopes, MD  traMADol (ULTRAM) 50 MG tablet Take 50 mg by mouth 3 (three) times daily as needed. 01/07/23  Yes [provider]  triamterene-hydrochlorothiazide (MAXZIDE-25) 37.5-25 MG tablet Take 1 tablet by mouth daily. 09/28/22  Yes [name], DO  augmented betamethasone dipropionate (DIPROLENE-AF) 0.05 % cream Apply 1 Application topically 2 (two) times daily as needed (rash). 07/12/22   [provider]  ferrous sulfate 325 (65 FE) MG EC tablet Take 325 mg by mouth daily with breakfast.    [provider]  folic acid (FOLVITE) 1 MG tablet Take 1 tablet (1 mg total) by mouth daily. 11/16/22   Azucena Fallen, MD  gabapentin (NEURONTIN) 100 MG capsule Take 1 capsule (100 mg total) by mouth 3 (three) times daily. 11/15/22   Azucena Fallen, MD  Multiple Vitamin (MULTIVITAMIN WITH MINERALS) TABS tablet Take 1 tablet by mouth daily. 11/16/22   Azucena Fallen, MD  promethazine (PHENERGAN) 25 MG tablet Take 1 tablet (25 mg total) by mouth every 6 (six) hours as needed for nausea or vomiting. 10/27/22   [name], DO    Physical Exam: Vitals:   01/08/23 1445 01/08/23 1500 01/08/23 1515 01/08/23 1525  BP: 124/66   124/68  Pulse: 62 70 74   Resp: 15 12 17 16   Temp: (!)  97 F (36.1 C)     TempSrc: Axillary     SpO2: 100% 100% 100%   Height:        Constitutional: somnolent but arousable, severely dehydrated with thick yellow dry crusted secretions on mouth, lips, teeth. Eyes: PERRL, lids and conjunctivae normal ENMT: Mucous membranes are dry. Posterior pharynx clear of any exudate or lesions. Poor dentition.  Neck: normal, supple, no masses, no thyromegaly Respiratory: clear to auscultation bilaterally, no wheezing, no crackles. Normal respiratory effort. No accessory muscle use.  Cardiovascular: normal s1, s2 sounds, no murmurs / rubs / gallops. No extremity edema. 2+ pedal pulses. No  carotid bruits.  Abdomen: morbidly obese, diffuse generalized tenderness with light palpation, no masses palpated. No hepatosplenomegaly. Bowel sounds positive.  Musculoskeletal: no clubbing / cyanosis. No joint deformity upper and lower extremities. Good ROM, no contractures. Normal muscle tone.  Skin: no rashes, lesions, ulcers. No induration Neurologic: CN 2-12 grossly intact. Sensation intact, DTR normal. Strength 5/5 in all 4.  Psychiatric: Normal judgment and insight. Alert and oriented x 3. Normal mood.   Labs on Admission: I have personally reviewed following labs and imaging studies  CBC: Recent Labs  Lab 01/08/23 1200  WBC 7.4  NEUTROABS 4.1  HGB 11.5*  HCT 38.7  MCV 88.4  PLT 273   Basic Metabolic Panel: Recent Labs  Lab 01/08/23 1200  NA 157*  K 3.9  CL 121*  CO2 20*  GLUCOSE 97  BUN 76*  CREATININE 1.66*  CALCIUM 9.7   GFR: CrCl cannot be calculated (Unknown ideal weight.). Liver Function Tests: Recent Labs  Lab 01/08/23 1200  AST 39  ALT 34  ALKPHOS 287*  BILITOT 1.0  PROT 7.7  ALBUMIN 3.2*   No results for input(s): "LIPASE", "AMYLASE" in the last 168 hours. Recent Labs  Lab 01/08/23 1200  AMMONIA 11   Coagulation Profile: No results for input(s): "INR", "PROTIME" in the last 168 hours. Cardiac Enzymes: No results for  input(s): "CKTOTAL", "CKMB", "CKMBINDEX", "TROPONINI" in the last 168 hours. BNP (last 3 results) No results for input(s): "PROBNP" in the last 8760 hours. HbA1C: No results for input(s): "HGBA1C" in the last 72 hours. CBG: Recent Labs  Lab 01/08/23 1247 01/08/23 1329  GLUCAP 65* 122*   Lipid Profile: No results for input(s): "CHOL", "HDL", "LDLCALC", "TRIG", "CHOLHDL", "LDLDIRECT" in the last 72 hours. Thyroid Function Tests: Recent Labs    01/08/23 1454  TSH 1.715   Anemia Panel: No results for input(s): "VITAMINB12", "FOLATE", "FERRITIN", "TIBC", "IRON", "RETICCTPCT" in the last 72 hours. Urine analysis:    Component Value Date/Time   COLORURINE AMBER (A) 11/09/2022 0155   APPEARANCEUR CLEAR 11/09/2022 0155   LABSPEC 1.043 (H) 11/09/2022 0155   PHURINE 6.0 11/09/2022 0155   GLUCOSEU NEGATIVE 11/09/2022 0155   HGBUR NEGATIVE 11/09/2022 0155   BILIRUBINUR NEGATIVE 11/09/2022 0155   KETONESUR 20 (A) 11/09/2022 0155   PROTEINUR 100 (A) 11/09/2022 0155   NITRITE NEGATIVE 11/09/2022 0155   LEUKOCYTESUR NEGATIVE 11/09/2022 0155    Radiological Exams on Admission: CT ABDOMEN PELVIS WO CONTRAST  Result Date: 01/08/2023 CLINICAL DATA:  Abdominal pain. EXAM: CT ABDOMEN AND PELVIS WITHOUT CONTRAST TECHNIQUE: Multidetector CT imaging of the abdomen and pelvis was performed following the standard protocol without IV contrast. RADIATION DOSE REDUCTION: This exam was performed according to the departmental dose-optimization program which includes automated exposure control, adjustment of the mA and/or kV according to patient size and/or use of iterative reconstruction technique. COMPARISON:  CT 10/15/2022 and older FINDINGS: Evaluation significant limited by motion despite multiple attempts. As per the CT technologist, the patient had difficulty with the breathing instructions. Lower chest: Lung bases are limited by motion. There is some basilar atelectasis. Coronary artery calcifications  are seen. Hepatobiliary: Grossly preserved parenchyma. Gallbladder is dilated. Pancreas: No obvious pancreatic mass. Spleen: Spleen is nonenlarged. Adrenals/Urinary Tract: Nodularity of the adrenal glands, poorly defined with the motion. No renal collecting system dilatation. No obvious obstructing stones. There is some cystic lesions in the left kidney including a larger lower pole focus with some septations and calcifications. Again evaluation is limited but appears similar to previous.  Mildly distended urinary bladder. Stomach/Bowel: Surgical changes gastric bypass. The stomach, small and large bowel are nondilated. There are some scattered stool. Details of the bowel are limited. Vascular/Lymphatic: Scattered vascular calcifications. No obvious dilatation of the abdominal aorta. IVC is nondilated. No obvious lymph node enlargement. Reproductive: Status post hysterectomy. No adnexal masses. Other: Markedly limited by motion. No obvious fluid collections. No large amounts of free air. Musculoskeletal: Scattered degenerative changes of the spine and pelvis. Overall moderate to severe. IMPRESSION: Distended gallbladder. If there is concern of gallbladder pathology ultrasound may be useful as the next step in the workup. Grossly stable left-sided renal cystic foci. Surgical changes of previous gastric bypass Markedly limited examination due to the level of motion. No obvious obstruction, free air or free fluid. In addition, a repeat study could be considered when the patient is more clinically able. Electronically Signed   By: Karen Kays M.D.   On: 01/08/2023 13:41   CT Head Wo Contrast  Result Date: 01/08/2023 CLINICAL DATA:  Altered mental status, weakness for 3 days, confusion EXAM: CT HEAD WITHOUT CONTRAST TECHNIQUE: Contiguous axial images were obtained from the base of the skull through the vertex without intravenous contrast. RADIATION DOSE REDUCTION: This exam was performed according to the departmental  dose-optimization program which includes automated exposure control, adjustment of the mA and/or kV according to patient size and/or use of iterative reconstruction technique. COMPARISON:  01/03/2023 FINDINGS: Brain: No evidence of acute infarction, hemorrhage, hydrocephalus, extra-axial collection or mass lesion/mass effect. Mild periventricular white matter hypodensity. Vascular: No hyperdense vessel or unexpected calcification. Skull: Normal. Negative for fracture or focal lesion. Sinuses/Orbits: No acute finding. Other: None. IMPRESSION: No acute intracranial pathology.  Small-vessel white matter disease. Electronically Signed   By: Jearld Lesch M.D.   On: 01/08/2023 13:38   DG Chest Port 1 View  Result Date: 01/08/2023 CLINICAL DATA:  Altered mental status. EXAM: PORTABLE CHEST 1 VIEW COMPARISON:  01/03/2023 FINDINGS: Low lung volumes. Similar streaky density in the retrocardiac medial left base, likely vascular anatomy. No focal consolidation, pulmonary edema, or pleural effusion. Cardiopericardial silhouette is at upper limits of normal for size. No acute bony abnormality. Telemetry leads overlie the chest. IMPRESSION: No active disease. Electronically Signed   By: Kennith Center M.D.   On: 01/08/2023 12:23    EKG: Independently reviewed.   Assessment/Plan Principal Problem:   Hypernatremia Active Problems:   Gastro-esophageal reflux disease with esophagitis   Essential hypertension   Osteoarthrosis   OSA on CPAP   GAD (generalized anxiety disorder)   Mixed incontinence urge and stress   Morbid obesity (HCC)   Abdominal pain   Gout   Gastroesophageal reflux disease   Prediabetes   Memory difficulties   Hyperlipidemia   Acute metabolic encephalopathy   Severe dehydration   Altered mental status   Failure to thrive in adult   AKI (acute kidney injury) (HCC)   Gallbladder dilatation   Hypernatremia / Severe Dehydration  - pt presented with severe clinical findings of free water  deficit  - immediately ordered D5W infusion to give free water - further investigate poor oral intake when patient more awake and alert - monitor sodium levels daily   Hypoglycemia  - from poor oral intake and recent gastric bypass  - monitor closely as these gastric bypass patients sometimes suffer from postprandial hypoglycemia which can be quite severe  - monitor CBG regularly - continue dextrose infusion with free water   AKI  - prerenal from severe dehydration  -  continue IV fluid hydration as ordered  - recheck renal function in morning  - renally dose medications as needed   Acute metabolic encephalopathy - secondary to severe hypernatremia  - hopefully will start to improve with proper hydration  - neuro checks as ordered  - given gastric bypass, check B12, folic acid, vitamin D levels  Generalized Abdominal Pain with gallbladder distension/dilation - given poor oral intake we need to make sure she is not having issues with gallbladder - for now will start with abdominal US to further evaluate gallbladder  OSA  - nightly CPAP ordered   Essential hypertension  - holding home antihypertensives until she has been rehydrated  - pt had been refusing all of her meds for past several days at SNF per report - BPs are soft at this time   Generalized weakness  - secondary to acute metabolic derangements discussed above  Morbid Obesity  - pt is postop s/p 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 Dossie Der. - her preop weight was 294.8 # and her 2nd postop visit she weighed 261.6# - current weight unknown, weight has been requested  - continue vitamin supplements   GERD - IV pantoprazole ordered for GI protection until she can take oral again  Adult Failure to Thrive - if no improvement, would recommend palliative consultation given precipitous decline after recent major surgery  DVT prophylaxis: enoxaparin   Code Status: Full   Family Communication:  sister update by ED nursing staff   Disposition Plan: anticipate return to [facility] rehab   Consults called:   Admission status: IP  Level of care: Med-Surg Standley Dakins MD [facility] How to contact the [facility] Attending or Consulting provider 7A - 7P or covering provider during after hours 7P -7A, for this patient?  Check the care team in [facility] and look for a) attending/consulting [facility] provider listed and b) the [facility] team listed Log into [email or web address] and use 's universal password to access. If you do not have the password, please contact the hospital operator. Locate the [facility] provider you are looking for under [facility] and page to a number that you can be directly reached. If you still have difficulty reaching the provider, please page the [facility] (Director on Call) for the Hospitalists listed on amion for assistance.   If 7PM-7AM, please contact night-coverage [email or web address] Password [facility]  01/08/2023, 3:55 PM

## 2023-01-08 NOTE — ED Notes (Signed)
Update given to pt's sister [name].

## 2023-01-08 NOTE — ED Triage Notes (Signed)
Pt from [facility]. EMS called d/t AMS and weakness for 3 days. EMS CBG 109. Pt alert on arrival but confused.

## 2023-01-08 NOTE — ED Notes (Signed)
Phone call to [facility] on floor. Renee to call back to get report.

## 2023-01-08 NOTE — ED Provider Notes (Signed)
[place] EMERGENCY DEPARTMENT AT [facility] Provider Note   CSN: [id number] Arrival date & time: 01/08/23  1138     History  Chief Complaint  Patient presents with   Altered Mental Status   Weakness    Christine [name] is a 72 y.o. female.  Level 5 caveat secondary to altered mental status.  Patient brought in by ambulance from her facility for altered mental status.  Reportedly has not been eating and has been refusing her medication for a few days.  She is unable to give any history.  Of note she was at [facility] 5 days ago for general weakness, had labs, CT head, chest x-ray.   The history is provided by the EMS personnel and the patient.  Altered Mental Status Presenting symptoms: behavior changes and confusion   Context: nursing home resident   Associated symptoms: weakness   Weakness      Home Medications Prior to Admission medications   Medication Sig Start Date End Date Taking? Authorizing Provider  augmented betamethasone dipropionate (DIPROLENE-AF) 0.05 % cream Apply 1 Application topically 2 (two) times daily as needed (rash). 07/12/22   [provider]  DULoxetine (CYMBALTA) 60 MG capsule Take 1 capsule (60 mg total) by mouth daily. 12/25/21   [name], DO  ferrous sulfate 325 (65 FE) MG EC tablet Take 325 mg by mouth daily with breakfast.    [provider]  folic acid (FOLVITE) 1 MG tablet Take 1 tablet (1 mg total) by mouth daily. 11/16/22   Azucena Fallen, MD  gabapentin (NEURONTIN) 100 MG capsule Take 1 capsule (100 mg total) by mouth 3 (three) times daily. 11/15/22   Azucena Fallen, MD  Multiple Vitamin (MULTIVITAMIN WITH MINERALS) TABS tablet Take 1 tablet by mouth daily. 11/16/22   Azucena Fallen, MD  oxybutynin (DITROPAN XL) 15 MG 24 hr tablet TAKE 1 TABLET BY MOUTH EVERY NIGHT AT BEDTIME 12/30/22   Wendling, Jilda Roche, DO  pantoprazole (PROTONIX) 40 MG tablet Take 1 tablet (40 mg total) by mouth  daily. 09/15/22   Stechschulte, Hyman Hopes, MD  promethazine (PHENERGAN) 25 MG tablet Take 1 tablet (25 mg total) by mouth every 6 (six) hours as needed for nausea or vomiting. 10/27/22   [name], DO  triamterene-hydrochlorothiazide (MAXZIDE-25) 37.5-25 MG tablet Take 1 tablet by mouth daily. 09/28/22   [name], DO      Allergies    Prednisone, Tape, and Sulfa antibiotics    Review of Systems   Review of Systems  Neurological:  Positive for weakness.  Psychiatric/Behavioral:  Positive for confusion.     Physical Exam Updated Vital Signs BP 120/61   Pulse 74   Temp (!) 97 F (36.1 C) (Axillary)   Resp 16   Ht 5\' 1"  (1.549 m)   SpO2 100%   BMI 47.74 kg/m  Physical Exam Vitals and nursing note reviewed.  Constitutional:      General: She is in acute distress (He seems to jump and moan with any type of palpation anywhere).     Appearance: Normal appearance. She is well-developed.  HENT:     Head: Normocephalic and atraumatic.     Mouth/Throat:     Mouth: Mucous membranes are dry.  Eyes:     Conjunctiva/sclera: Conjunctivae normal.  Cardiovascular:     Rate and Rhythm: Regular rhythm. Tachycardia present.     Heart sounds: No murmur heard. Pulmonary:     Effort: Pulmonary effort is  normal. No respiratory distress.     Breath sounds: Normal breath sounds. No stridor. No wheezing.  Abdominal:     Palpations: Abdomen is soft.     Tenderness: There is abdominal tenderness (generalized). There is no guarding or rebound.  Musculoskeletal:        General: No tenderness or deformity. Normal range of motion.     Cervical back: Neck supple.  Skin:    General: Skin is warm and dry.  Neurological:     General: No focal deficit present.     GCS: GCS eye subscore is 4. GCS verbal subscore is 5. GCS motor subscore is 6.     Comments: She is moving her upper extremities and lower extremities without any focal deficits.  There is no gross facial asymmetry.  She  is not answering questions though     ED Results / Procedures / Treatments   Labs (all labs ordered are listed, but only abnormal results are displayed) Labs Reviewed  COMPREHENSIVE METABOLIC PANEL - Abnormal; Notable for the following components:      Result Value   Sodium 157 (*)    Chloride 121 (*)    CO2 20 (*)    BUN 76 (*)    Creatinine, Ser 1.66 (*)    Albumin 3.2 (*)    Alkaline Phosphatase 287 (*)    GFR, Estimated 33 (*)    Anion gap 16 (*)    All other components within normal limits  CBC WITH DIFFERENTIAL/PLATELET - Abnormal; Notable for the following components:   Hemoglobin 11.5 (*)    MCHC 29.7 (*)    RDW 22.2 (*)    nRBC 0.4 (*)    All other components within normal limits  CBG MONITORING, ED - Abnormal; Notable for the following components:   Glucose-Capillary 65 (*)    All other components within normal limits  CBG MONITORING, ED - Abnormal; Notable for the following components:   Glucose-Capillary 122 (*)    All other components within normal limits  SARS CORONAVIRUS 2 BY RT PCR  MRSA NEXT GEN BY PCR, NASAL  AMMONIA  LACTIC ACID, PLASMA  TSH  FOLATE  URINALYSIS, ROUTINE W REFLEX MICROSCOPIC  VITAMIN D 25 HYDROXY (VIT D DEFICIENCY, FRACTURES)  COMPREHENSIVE METABOLIC PANEL  MAGNESIUM  CBC    EKG EKG Interpretation Date/Time:  Saturday January 08 2023 12:09:31 EDT Ventricular Rate:  67 PR Interval:  150 QRS Duration:  91 QT Interval:  384 QTC Calculation: 406 R Axis:   54  Text Interpretation: Sinus rhythm Probable left atrial enlargement Borderline T wave abnormalities No significant change since prior 7/24 Confirmed by Meridee Score [phone]) on 01/08/2023 12:16:13 PM  Radiology CT ABDOMEN PELVIS WO CONTRAST  Result Date: 01/08/2023 CLINICAL DATA:  Abdominal pain. EXAM: CT ABDOMEN AND PELVIS WITHOUT CONTRAST TECHNIQUE: Multidetector CT imaging of the abdomen and pelvis was performed following the standard protocol without IV contrast.  RADIATION DOSE REDUCTION: This exam was performed according to the departmental dose-optimization program which includes automated exposure control, adjustment of the mA and/or kV according to patient size and/or use of iterative reconstruction technique. COMPARISON:  CT 10/15/2022 and older FINDINGS: Evaluation significant limited by motion despite multiple attempts. As per the CT technologist, the patient had difficulty with the breathing instructions. Lower chest: Lung bases are limited by motion. There is some basilar atelectasis. Coronary artery calcifications are seen. Hepatobiliary: Grossly preserved parenchyma. Gallbladder is dilated. Pancreas: No obvious pancreatic mass. Spleen: Spleen is nonenlarged. Adrenals/Urinary Tract:  Nodularity of the adrenal glands, poorly defined with the motion. No renal collecting system dilatation. No obvious obstructing stones. There is some cystic lesions in the left kidney including a larger lower pole focus with some septations and calcifications. Again evaluation is limited but appears similar to previous. Mildly distended urinary bladder. Stomach/Bowel: Surgical changes gastric bypass. The stomach, small and large bowel are nondilated. There are some scattered stool. Details of the bowel are limited. Vascular/Lymphatic: Scattered vascular calcifications. No obvious dilatation of the abdominal aorta. IVC is nondilated. No obvious lymph node enlargement. Reproductive: Status post hysterectomy. No adnexal masses. Other: Markedly limited by motion. No obvious fluid collections. No large amounts of free air. Musculoskeletal: Scattered degenerative changes of the spine and pelvis. Overall moderate to severe. IMPRESSION: Distended gallbladder. If there is concern of gallbladder pathology ultrasound may be useful as the next step in the workup. Grossly stable left-sided renal cystic foci. Surgical changes of previous gastric bypass Markedly limited examination due to the level of  motion. No obvious obstruction, free air or free fluid. In addition, a repeat study could be considered when the patient is more clinically able. Electronically Signed   By: Karen Kays M.D.   On: 01/08/2023 13:41   CT Head Wo Contrast  Result Date: 01/08/2023 CLINICAL DATA:  Altered mental status, weakness for 3 days, confusion EXAM: CT HEAD WITHOUT CONTRAST TECHNIQUE: Contiguous axial images were obtained from the base of the skull through the vertex without intravenous contrast. RADIATION DOSE REDUCTION: This exam was performed according to the departmental dose-optimization program which includes automated exposure control, adjustment of the mA and/or kV according to patient size and/or use of iterative reconstruction technique. COMPARISON:  01/03/2023 FINDINGS: Brain: No evidence of acute infarction, hemorrhage, hydrocephalus, extra-axial collection or mass lesion/mass effect. Mild periventricular white matter hypodensity. Vascular: No hyperdense vessel or unexpected calcification. Skull: Normal. Negative for fracture or focal lesion. Sinuses/Orbits: No acute finding. Other: None. IMPRESSION: No acute intracranial pathology.  Small-vessel white matter disease. Electronically Signed   By: Jearld Lesch M.D.   On: 01/08/2023 13:38   DG Chest Port 1 View  Result Date: 01/08/2023 CLINICAL DATA:  Altered mental status. EXAM: PORTABLE CHEST 1 VIEW COMPARISON:  01/03/2023 FINDINGS: Low lung volumes. Similar streaky density in the retrocardiac medial left base, likely vascular anatomy. No focal consolidation, pulmonary edema, or pleural effusion. Cardiopericardial silhouette is at upper limits of normal for size. No acute bony abnormality. Telemetry leads overlie the chest. IMPRESSION: No active disease. Electronically Signed   By: Kennith Center M.D.   On: 01/08/2023 12:23    Procedures .Critical Care  Performed by: Terrilee Files, MD Authorized by: Terrilee Files, MD   Critical care provider  statement:    Critical care time (minutes):  45   Critical care time was exclusive of:  Separately billable procedures and treating other patients   Critical care was necessary to treat or prevent imminent or life-threatening deterioration of the following conditions:  CNS failure or compromise and metabolic crisis   Critical care was time spent personally by me on the following activities:  Development of treatment plan with patient or surrogate, discussions with consultants, evaluation of patient's response to treatment, examination of patient, obtaining history from patient or surrogate, ordering and performing treatments and interventions, ordering and review of laboratory studies, ordering and review of radiographic studies, pulse oximetry, re-evaluation of patient's condition and review of old charts   I assumed direction of critical care for this patient  from another provider in my specialty: no       Medications Ordered in ED Medications  enoxaparin (LOVENOX) injection 40 mg (has no administration in time range)  dextrose 5 % solution ( Intravenous New Bag/Given 01/08/23 1520)  acetaminophen (TYLENOL) tablet 650 mg (has no administration in time range)    Or  acetaminophen (TYLENOL) suppository 650 mg (has no administration in time range)  oxyCODONE (Oxy IR/ROXICODONE) immediate release tablet 5 mg (has no administration in time range)  fentaNYL (SUBLIMAZE) injection 25 mcg (has no administration in time range)  traZODone (DESYREL) tablet 50 mg (has no administration in time range)  docusate sodium (COLACE) capsule 100 mg (has no administration in time range)  ondansetron (ZOFRAN) tablet 4 mg (has no administration in time range)    Or  ondansetron (ZOFRAN) injection 4 mg (has no administration in time range)  pantoprazole (PROTONIX) injection 40 mg (40 mg Intravenous Given 01/08/23 1517)  Oral care mouth rinse (15 mLs Mouth Rinse Patient Refused/Not Given 01/08/23 1657)  Oral care  mouth rinse (has no administration in time range)  antiseptic oral rinse (BIOTENE) solution 15 mL (has no administration in time range)  ascorbic acid (VITAMIN C) tablet 500 mg (has no administration in time range)  colchicine tablet 0.6 mg (has no administration in time range)  diclofenac Sodium (VOLTAREN) 1 % topical gel 2 g (has no administration in time range)  DULoxetine (CYMBALTA) DR capsule 60 mg (has no administration in time range)  ferrous sulfate tablet 325 mg (has no administration in time range)  folic acid (FOLVITE) tablet 1 mg (has no administration in time range)  multivitamin with minerals tablet 1 tablet (has no administration in time range)  oxybutynin (DITROPAN-XL) 24 hr tablet 15 mg (has no administration in time range)  senna (SENOKOT) tablet 8.6 mg (has no administration in time range)  (feeding supplement) PROSource Plus liquid 30 mL (has no administration in time range)  zinc sulfate capsule 220 mg (has no administration in time range)  sodium chloride 0.9 % bolus 1,000 mL ( Intravenous Canceled Entry 01/08/23 1331)  dextrose 50 % solution 50 mL (50 mLs Intravenous Given 01/08/23 1309)  0.9 %  sodium chloride infusion ( Intravenous Infusion Verify 01/08/23 1521)    ED Course/ Medical Decision Making/ A&P Clinical Course as of 01/08/23 1756  Sat Jan 08, 2023  1227 Chest x-ray interpreted by me as poor inspiration no gross infiltrate.  Awaiting radiology reading. [MB]  1426 Discussed with Dr. Laural Benes [facility] hospitalist who will evaluate patient for admission. [MB]    Clinical Course User Index [MB] Terrilee Files, MD                                 Medical Decision Making Amount and/or Complexity of Data Reviewed Labs: ordered. Radiology: ordered.  Risk Prescription drug management. Decision regarding hospitalization.   This patient complains of altered mental status possible dehydration; this involves an extensive number of treatment Options and is a  complaint that carries with it a high risk of complications and morbidity. The differential includes stroke, bleed, dehydration, metabolic derangement, renal failure, infection  I ordered, reviewed and interpreted labs, which included CBC with normal white count stable low hemoglobin, chemistries with markedly elevated sodium elevated BUN and creatinine elevated alk phos, COVID-negative, TSH normal, lactate normal, urinalysis ordered not obtained yet I ordered medication IV fluids IV dextrose and reviewed PMP when indicated.  I ordered imaging studies which included chest x-ray, CT head CT abdomen and pelvis and I independently    visualized and interpreted imaging which showed no acute stroke no gross infiltrate.  Does have some gallbladder dilation Additional history obtained from EMS Previous records obtained and reviewed in epic, patient has been frequently admitted for abdominal pain sepsis dehydration weakness I consulted Dr. Laural Benes [facility] hospitalist and discussed lab and imaging findings and discussed disposition.  Cardiac monitoring reviewed, sinus rhythm Social determinants considered, no significant barriers Critical Interventions: Workup and management of altered mental status requiring complex decision making interpretation of labs and radiology imaging and treatment  After the interventions stated above, I reevaluated the patient and found patient to be hemodynamically stable. Admission and further testing considered, she would benefit from mission to the hospital for further workup and management of her altered mental status.  No family at bedside         Final Clinical Impression(s) / ED Diagnoses Final diagnoses:  Acute metabolic encephalopathy  Hypoglycemia  AKI (acute kidney injury) (HCC)  Hypernatremia    Rx / DC Orders ED Discharge Orders     None         Terrilee Files, MD 01/08/23 1801

## 2023-01-08 NOTE — Progress Notes (Signed)
Pt refused CPAP at this time. Will let RN know to call RT if she feels like she wants it at a later time.

## 2023-01-08 NOTE — Progress Notes (Signed)
Nursing received call from patient's sister Semiko Gambone. Verified she is listed as emergency contact and hPOA per review of documents on chart. States she lives in [place], has hPOA and is trying to arrange way to get here to check on her sister. She reported ED staff let her know of admission and pending tests. Patient was asleep so unable to speak with her when she called. Provided her contact information to Dr Laural Benes with her request for an update. She reported patient has recently been admitted to another facility for encephalitis and the SNF staff notified her on Friday of patient having a recent fall. Reports patient was alert and oriented and ambulatory prior to that from what she knew. She did reference she had been notified of her sister having multiple pressure wounds.

## 2023-01-08 NOTE — ED Notes (Signed)
IVF infusing, NAD, calm. BS improved. No changes.

## 2023-01-08 NOTE — Progress Notes (Signed)
Nursing staff report patient had loose, mucousy, black stool.  This is second mucously dark stool within the past hour. Notified Dr Laural Benes. Orders received for hemoocult, GI panel and c-diff testing. Enteric isolation initiated.

## 2023-01-09 ENCOUNTER — Inpatient Hospital Stay (HOSPITAL_COMMUNITY): Payer: Medicare HMO

## 2023-01-09 ENCOUNTER — Encounter (HOSPITAL_COMMUNITY): Payer: Self-pay | Admitting: Family Medicine

## 2023-01-09 DIAGNOSIS — E870 Hyperosmolality and hypernatremia: Secondary | ICD-10-CM

## 2023-01-09 DIAGNOSIS — E860 Dehydration: Secondary | ICD-10-CM

## 2023-01-09 DIAGNOSIS — R109 Unspecified abdominal pain: Secondary | ICD-10-CM | POA: Diagnosis not present

## 2023-01-09 DIAGNOSIS — R627 Adult failure to thrive: Secondary | ICD-10-CM

## 2023-01-09 LAB — URINALYSIS, ROUTINE W REFLEX MICROSCOPIC
Bilirubin Urine: NEGATIVE
Glucose, UA: NEGATIVE mg/dL
Ketones, ur: NEGATIVE mg/dL
Nitrite: POSITIVE — AB
Protein, ur: 30 mg/dL — AB
Specific Gravity, Urine: 1.013 (ref 1.005–1.030)
pH: 5 (ref 5.0–8.0)

## 2023-01-09 LAB — MAGNESIUM: Magnesium: 2 mg/dL (ref 1.7–2.4)

## 2023-01-09 LAB — COMPREHENSIVE METABOLIC PANEL
ALT: 27 U/L (ref 0–44)
AST: 32 U/L (ref 15–41)
Albumin: 2.8 g/dL — ABNORMAL LOW (ref 3.5–5.0)
Alkaline Phosphatase: 222 U/L — ABNORMAL HIGH (ref 38–126)
Anion gap: 12 (ref 5–15)
BUN: 62 mg/dL — ABNORMAL HIGH (ref 8–23)
CO2: 17 mmol/L — ABNORMAL LOW (ref 22–32)
Calcium: 9 mg/dL (ref 8.9–10.3)
Chloride: 121 mmol/L — ABNORMAL HIGH (ref 98–111)
Creatinine, Ser: 1.32 mg/dL — ABNORMAL HIGH (ref 0.44–1.00)
GFR, Estimated: 44 mL/min — ABNORMAL LOW (ref >60)
Glucose, Bld: 105 mg/dL — ABNORMAL HIGH (ref 70–99)
Potassium: 3.2 mmol/L — ABNORMAL LOW (ref 3.5–5.1)
Sodium: 150 mmol/L — ABNORMAL HIGH (ref 135–145)
Total Bilirubin: 0.8 mg/dL (ref 0.3–1.2)
Total Protein: 6.8 g/dL (ref 6.5–8.1)

## 2023-01-09 LAB — CBC
HCT: 34 % — ABNORMAL LOW (ref 36.0–46.0)
Hemoglobin: 10.2 g/dL — ABNORMAL LOW (ref 12.0–15.0)
MCH: 26.2 pg (ref 26.0–34.0)
MCHC: 30 g/dL (ref 30.0–36.0)
MCV: 87.2 fL (ref 80.0–100.0)
Platelets: 240 K/uL (ref 150–400)
RBC: 3.9 MIL/uL (ref 3.87–5.11)
RDW: 21.5 % — ABNORMAL HIGH (ref 11.5–15.5)
WBC: 8.2 K/uL (ref 4.0–10.5)
nRBC: 0.5 % — ABNORMAL HIGH (ref 0.0–0.2)

## 2023-01-09 LAB — GLUCOSE, CAPILLARY
Glucose-Capillary: 90 mg/dL (ref 70–99)
Glucose-Capillary: 104 mg/dL — ABNORMAL HIGH (ref 70–99)

## 2023-01-09 LAB — VITAMIN B12: Vitamin B-12: 1183 pg/mL — ABNORMAL HIGH (ref 180–914)

## 2023-01-09 MED ORDER — SODIUM CHLORIDE 0.9 % IV SOLN
1.0000 g | INTRAVENOUS | Status: AC
  Administered 2023-01-09 – 2023-01-11 (×3): 1 g via INTRAVENOUS
  Filled 2023-01-09 (×3): qty 10

## 2023-01-09 MED ORDER — POTASSIUM CHLORIDE 10 MEQ/100ML IV SOLN
10.0000 meq | INTRAVENOUS | Status: AC
  Administered 2023-01-09 (×4): 10 meq via INTRAVENOUS
  Filled 2023-01-09 (×4): qty 100

## 2023-01-09 NOTE — Consult Note (Signed)
WOC Nurse Consult Note: Remote consult completed.  Reason for Consult:Stage 2 pressure injury and moisture associated skin damage to bilateral buttocks and left thigh.  Admitted with severe dehydration, failure to thrive. 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 Wound type: pressure and moisture Pressure Injury POA: Yes Measurement: see nurse flow sheets, obtained on admission.  Wound bed: Noted as red and moist.  No devitalized tissue.  Drainage (amount, consistency, odor) minimal serosanguinous  no odor.  Periwound: intact, erythema Dressing procedure/placement/frequency: Cleanse wounds to buttocks, sacrum and left thigh with NS and pat dry. Apply barrier cream to buttocks and foam dressings to thigh and sacrum. Re-apply cream each shift and assess all wounds.  Replace foam every other day.  No disposable briefs or underpads while in bed.  Will not follow at this time.  Please re-consult if needed.  Mike Gip MSN, RN, FNP-BC CWON Wound, Ostomy, Continence Nurse Outpatient [facility] [phone] Pager [phone]

## 2023-01-09 NOTE — TOC Initial Note (Signed)
Transition of Care [facility]) - Initial/Assessment Note    Patient Details  Name: Christine [name] MRN: [id number] Date of Birth: [birth year]  Transition of Care [facility]) CM/SW Contact:    Villa Herb, LCSWA Phone Number: 01/09/2023, 11:14 AM  Clinical Narrative:                 Pt is high risk for readmission. CSW completed chart review and notes that pt arrived to hospital from [facility]. CSW spoke to Davis with facility who states that pt is a short term rehab pt and will need a PT eval and auth prior to returning. TOC to follow.   Expected Discharge Plan: Skilled Nursing Facility Barriers to Discharge: Continued Medical Work up   Patient Goals and CMS Choice Patient states their goals for this hospitalization and ongoing recovery are:: return to facility CMS Medicare.gov Compare Post Acute Care list provided to:: Patient Choice offered to / list presented to : Patient      Expected Discharge Plan and Services In-house Referral: Clinical Social Work Discharge Planning Services: CM Consult Post Acute Care Choice: Skilled Nursing Facility Living arrangements for the past 2 months: Skilled Nursing Facility                                      Prior Living Arrangements/Services Living arrangements for the past 2 months: Skilled Nursing Facility Lives with:: Facility Resident Patient language and need for interpreter reviewed:: Yes Do you feel safe going back to the place where you live?: Yes      Need for Family Participation in Patient Care: Yes (Comment) Care giver support system in place?: Yes (comment)   Criminal Activity/Legal Involvement Pertinent to Current Situation/Hospitalization: No - Comment as needed  Activities of Daily Living Home Assistive Devices/Equipment: Other (Comment) (admitted from SNF, unsure of equipment) ADL Screening (condition at time of admission) Patient's cognitive ability adequate to safely complete daily activities?: No Is the  patient deaf or have difficulty hearing?: No Does the patient have difficulty seeing, even when wearing glasses/contacts?: No Does the patient have difficulty concentrating, remembering, or making decisions?: Yes Patient able to express need for assistance with ADLs?: No Does the patient have difficulty dressing or bathing?: Yes Independently performs ADLs?: No Communication: Independent Dressing (OT): Needs assistance Is this a change from baseline?: Pre-admission baseline Grooming: Needs assistance Is this a change from baseline?: Pre-admission baseline Feeding: Needs assistance Is this a change from baseline?: Pre-admission baseline Bathing: Needs assistance Is this a change from baseline?: Pre-admission baseline Toileting: Needs assistance Is this a change from baseline?: Pre-admission baseline In/Out Bed: Needs assistance Is this a change from baseline?: Pre-admission baseline Walks in Home: Needs assistance Is this a change from baseline?: Pre-admission baseline Does the patient have difficulty walking or climbing stairs?: Yes Weakness of Legs: Both Weakness of Arms/Hands: Both  Permission Sought/Granted                  Emotional Assessment Appearance:: Appears stated age Attitude/Demeanor/Rapport: Engaged Affect (typically observed): Accepting   Alcohol / Substance Use: Not Applicable Psych Involvement: No (comment)  Admission diagnosis:  Hypernatremia [E87.0] Patient Active Problem List   Diagnosis Date Noted   Hypernatremia 01/08/2023   Severe dehydration 01/08/2023   Altered mental status 01/08/2023   Failure to thrive in adult 01/08/2023   AKI (acute kidney injury) (HCC) 01/08/2023   Gallbladder dilatation 01/08/2023   SIRS (  systemic inflammatory response syndrome) (HCC) 11/09/2022   Sepsis (HCC) 11/09/2022   Acute metabolic encephalopathy 11/09/2022   Renal insufficiency 11/09/2022   Hypokalemia 11/09/2022   Normocytic anemia 11/09/2022   Morbid  obesity with BMI of 50.0-59.9, adult (HCC) 09/14/2022   Gastric polyp 04/08/2022   Hyperlipidemia 05/20/2021   Lumbar radiculopathy 12/24/2020   Trigger little finger of left hand 12/16/2020   Medial epicondylitis of elbow, left 12/16/2020   Memory difficulties 12/11/2020   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   Globus sensation 11/15/2019   Gastroesophageal reflux disease 11/15/2019   Throat clearing 10/05/2019   Gout 08/31/2019   Phlebitis 08/31/2019   Prediabetes 08/31/2019   OA (osteoarthritis) of knee 05/17/2019   Acute pain of right knee 05/17/2019   Abdominal pain 03/31/2019   Morbid obesity (HCC) 03/29/2018   GAD (generalized anxiety disorder) 02/27/2018   Mixed incontinence urge and stress 02/27/2018   OSA on CPAP 06/16/2017   Acute appendicitis 05/06/2016   Gastro-esophageal reflux disease with esophagitis 07/26/2011   Essential hypertension 07/26/2011   Osteoarthrosis 10/16/2009   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, including shoulder 08/22/2008   Pre-operative cardiovascular examination 08/22/2008   Anxiety 04/27/2003   PCP:  [name], DO Pharmacy:   [id number] - [place], [place] - [address] AT [facility] [address] [place] [address] Phone: [phone] Fax: [phone]  [facility] [id number] [place], [place] - [address] [place] [address] Phone: [phone] Fax: [phone]     Social Determinants of Health (SDOH) Social History: SDOH Screenings   Food Insecurity: Patient Unable To Answer (01/08/2023)  Housing: Patient Declined (01/08/2023)  Transportation Needs: Patient Unable To Answer (01/08/2023)  Utilities: Patient Unable To Answer (01/08/2023)  Alcohol Screen: Low Risk  (07/13/2022)  Depression (PHQ2-9): Low Risk  (01/21/2022)  Financial Resource Strain: Low Risk  (12/01/2022)    Received from [facility]  Physical Activity: Insufficiently Active (07/13/2022)  Social Connections: Moderately Integrated (12/01/2022)   Received from [facility]  Stress: No Stress Concern Present (07/13/2022)  Tobacco Use: Medium Risk (01/08/2023)  Health Literacy: Medium Risk (12/01/2022)   Received from [facility]   SDOH Interventions:     Readmission Risk Interventions    01/09/2023   11:11 AM  Readmission Risk Prevention Plan  Transportation Screening Complete  HRI or Home Care Consult Complete  Social Work Consult for Recovery Care Planning/Counseling Complete  Palliative Care Screening Not Applicable  Medication Review Oceanographer) Complete

## 2023-01-09 NOTE — Progress Notes (Signed)
Pt refused PO medications. Pt did brush her teeth this a.m. and got a lot of plaque build up off. Pt will do things herself such as ADLs, but will not allow staff members to do anything to her. Pt's mood is unstable and pt goes between calm and angry very quickly.

## 2023-01-09 NOTE — Progress Notes (Signed)
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 Dossie Der.  She has been progressively declining since surgery.  She has had multiple hospitalizations and ED visits in past several months.  Recently seen at [facility] on 01/03/23 with complaints of weakness and poor oral intake.  She was hospitalized in July and August of this year at different facilities for dehydration, weakness, acute renal failure and failure to thrive.  She has been residing in a SNF.  She apparently has not been eating or drinking and refusing to take her medications.  She was sent to ER today for evaluation of altered mentation.  She has had 3 days of progressive weakness and refusing oral intake.  She complains of pain all over body which is not new per record review. Pt is a poor historian.   She was found to be severely dehydrated with hypoglycemia and hypernatremic with a sodium of 157.  She also had acute kidney injury and distended gallbladder on CT scan.  CT head with no acute findings.  Pt was started on IV hydration and admission was requested.      Assessment and Plan:  Hypernatremia / Severe Dehydration  - pt presented with severe clinical findings of free water deficit  - continue D5W infusion to give free water - sodium slowing trending down but remains high at 150  - monitor sodium levels daily    Hypoglycemia  - from poor oral intake and recent gastric bypass  - monitor closely as these gastric bypass patients sometimes suffer from postprandial hypoglycemia which can be quite severe  - monitor CBG regularly - continue dextrose infusion with  free water    AKI  - prerenal from severe dehydration  - continue IV fluid hydration as ordered  - recheck renal function in morning  - renally dose medications as needed    Acute metabolic encephalopathy - secondary to severe hypernatremia  - starting to improve with proper hydration  - given gastric bypass, check B12, folic acid, vitamin D levels   Generalized Abdominal Pain with gallbladder distension/dilation - given poor oral intake we need to make sure she is not having issues with gallbladder - abdominal US shows distended gallbladder with minimal sludge   OSA  - nightly CPAP ordered    Essential hypertension  - holding home antihypertensives until she has been better rehydrated  - pt had been refusing all of her meds for past several days at SNF per report - BPs are soft at this time    Generalized weakness  - secondary to acute metabolic derangements discussed above   Morbid Obesity  - pt is postop s/p 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 Dossie Der. - her preop weight was 294.8 # and her 2nd postop visit she weighed 261.6# - current weight unknown, weight has been requested  - continue vitamin supplements    GERD - IV pantoprazole ordered for GI protection until she can take oral again   Adult Failure to Thrive - if no improvement, would recommend palliative consultation given precipitous decline after recent major surgery   DVT prophylaxis: enoxaparin Code Status: full  Family Communication: t/c  to sister, no answer 01/09/23 Disposition: anticipate return to SNF STR   Consultants:   Procedures:   Antimicrobials:   Ceftriaxone 9/15>>  Subjective: Still refusing to take medication; not eating or drinking much   Objective: Vitals:   01/08/23 1732 01/08/23 1818 01/08/23 2200 01/09/23 0510  BP: 120/61  128/74 (!) 145/66  Pulse: 74  70 72  Resp: 16  18 20   Temp:  98 F (36.7 C) 98.9 F (37.2 C) 98.7 F (37.1 C)  TempSrc:  Oral  Oral   SpO2: 100%  100% 100%  Weight:    101.5 kg  Height:        Intake/Output Summary (Last 24 hours) at 01/09/2023 1149 Last data filed at 01/09/2023 0603 Gross per 24 hour  Intake 3532.51 ml  Output 300 ml  Net 3232.51 ml   Filed Weights   01/09/23 0510  Weight: 101.5 kg   Examination:  General exam: able to speak and interact but is confused, slowed speech;  dry mucus membranes. Respiratory system: Clear to auscultation. Respiratory effort normal. Cardiovascular system: normal S1 & S2 heard. No JVD, murmurs, rubs, gallops or clicks. No pedal edema. Gastrointestinal system: Abdomen is nondistended, soft and nontender. No organomegaly or masses felt. Normal bowel sounds heard. Central nervous system: Alert and disoriented. No focal neurological deficits. Extremities: Symmetric 5 x 5 power. Skin: No rashes, lesions or ulcers. Psychiatry: Judgement and insight appear poor. Mood & affect flat.   Data Reviewed: I have personally reviewed following labs and imaging studies  CBC: Recent Labs  Lab 01/08/23 1200 01/09/23 0423  WBC 7.4 8.2  NEUTROABS 4.1  --   HGB 11.5* 10.2*  HCT 38.7 34.0*  MCV 88.4 87.2  PLT 273 240    Basic Metabolic Panel: Recent Labs  Lab 01/08/23 1200 01/09/23 0423  NA 157* 150*  K 3.9 3.2*  CL 121* 121*  CO2 20* 17*  GLUCOSE 97 105*  BUN 76* 62*  CREATININE 1.66* 1.32*  CALCIUM 9.7 9.0  MG  --  2.0    CBG: Recent Labs  Lab 01/08/23 1247 01/08/23 1329 01/09/23 0512 01/09/23 0735  GLUCAP 65* 122* 90 104*    Recent Results (from the past 240 hour(s))  SARS Coronavirus 2 by RT PCR (hospital order, performed in [facility] hospital lab) *cepheid single result test* Anterior Nasal Swab     Status: None   Collection Time: 01/08/23 12:18 PM   Specimen: Anterior Nasal Swab  Result Value Ref Range Status   SARS Coronavirus 2 by RT PCR NEGATIVE NEGATIVE Final    Comment: (NOTE) SARS-CoV-2 target nucleic acids are NOT DETECTED.  The  SARS-CoV-2 RNA is generally detectable in upper and lower respiratory specimens during the acute phase of infection. The lowest concentration of SARS-CoV-2 viral copies this assay can detect is 250 copies / mL. A negative result does not preclude SARS-CoV-2 infection and should not be used as the sole basis for treatment or other patient management decisions.  A negative result may occur with improper specimen collection / handling, submission of specimen other than nasopharyngeal swab, presence of viral mutation(s) within the areas targeted by this assay, and inadequate number of viral copies (<250 copies / mL). A negative result must be combined with clinical observations, patient history, and epidemiological information.  Fact Sheet for Patients:   RoadLapTop.co.za  Fact Sheet for Healthcare Providers: http://kim-miller.com/  This test is not yet approved or  cleared by the [place] and has been authorized  for detection and/or diagnosis of SARS-CoV-2 by FDA under an Emergency Use Authorization (EUA).  This EUA will remain in effect (meaning this test can be used) for the duration of the COVID-19 declaration under Section 564(b)(1) of the Act, 21 U.S.C. section 360bbb-3(b)(1), unless the authorization is terminated or revoked sooner.  Performed at [facility], [address] [place], [place] [address]   MRSA Next Gen by PCR, Nasal     Status: None   Collection Time: 01/08/23  5:50 PM   Specimen: Nasal Mucosa; Nasal Swab  Result Value Ref Range Status   MRSA by PCR Next Gen NOT DETECTED NOT DETECTED Final    Comment: (NOTE) The GeneXpert MRSA Assay (FDA approved for NASAL specimens only), is one component of a comprehensive MRSA colonization surveillance program. It is not intended to diagnose MRSA infection nor to guide or monitor treatment for MRSA infections. Test performance is not FDA approved in patients less than 69  years old. Performed at [facility], [address], [place], [place] [address]   C Difficile Quick Screen w PCR reflex     Status: None   Collection Time: 01/08/23  6:15 PM   Specimen: Stool  Result Value Ref Range Status   C Diff antigen NEGATIVE NEGATIVE Final   C Diff toxin NEGATIVE NEGATIVE Final   C Diff interpretation No C. difficile detected.  Final    Comment: Performed at [facility], [address], [place], [place] [address]     Radiology Studies: US Abdomen Complete  Result Date: 01/09/2023 CLINICAL DATA:  [id number] Gallbladder dilatation 606301 [id number] Generalized abdominal pain 148134 EXAM: ABDOMEN ULTRASOUND COMPLETE COMPARISON:  None Available. FINDINGS: Gallbladder: Gallbladder appears distended. Echogenic structure in the gallbladder is consistent with sludge. No shadowing gallstones or wall thickening visualized. No sonographic Murphy sign noted by sonographer. Common bile duct: Diameter: 0.4cm. Liver: No focal lesion identified. Hyperechoic consistent with fatty infiltration. IVC: No abnormality visualized. Pancreas: Obscured by bowel gas. Spleen: Size and appearance within normal limits. Right Kidney: Length: 10.6 cm. Echogenicity increased consistent with chronic medical renal disease. No mass or hydronephrosis visualized. No shadowing stones. Left Kidney: Length: 9.6cm. Echogenicity increased consistent with chronic medical renal disease. Cyst measures 5.5 cm. No follow up recommended based on the ultrasound appearance of the cyst. No solid mass or hydronephrosis visualized. No shadowing stones. Abdominal aorta: Obscured by bowel gas. IMPRESSION: 1. Hepatic fatty infiltration. 2. Gallbladder distended. 3. Minimal gallbladder sludge. 4. Echogenic kidneys consistent with chronic medical renal disease. 5. Left kidney cyst. 6. There was limited visualization, as described. Electronically Signed   By: Layla Maw M.D.   On: 01/09/2023 11:17   CT ABDOMEN PELVIS WO  CONTRAST  Result Date: 01/08/2023 CLINICAL DATA:  Abdominal pain. EXAM: CT ABDOMEN AND PELVIS WITHOUT CONTRAST TECHNIQUE: Multidetector CT imaging of the abdomen and pelvis was performed following the standard protocol without IV contrast. RADIATION DOSE REDUCTION: This exam was performed according to the departmental dose-optimization program which includes automated exposure control, adjustment of the mA and/or kV according to patient size and/or use of iterative reconstruction technique. COMPARISON:  CT 10/15/2022 and older FINDINGS: Evaluation significant limited by motion despite multiple attempts. As per the CT technologist, the patient had difficulty with the breathing instructions. Lower chest: Lung bases are limited by motion. There is some basilar atelectasis. Coronary artery calcifications are seen. Hepatobiliary: Grossly preserved parenchyma. Gallbladder is dilated. Pancreas: No obvious pancreatic mass. Spleen: Spleen is nonenlarged. Adrenals/Urinary Tract: Nodularity of the adrenal glands,  poorly defined with the motion. No renal collecting system dilatation. No obvious obstructing stones. There is some cystic lesions in the left kidney including a larger lower pole focus with some septations and calcifications. Again evaluation is limited but appears similar to previous. Mildly distended urinary bladder. Stomach/Bowel: Surgical changes gastric bypass. The stomach, small and large bowel are nondilated. There are some scattered stool. Details of the bowel are limited. Vascular/Lymphatic: Scattered vascular calcifications. No obvious dilatation of the abdominal aorta. IVC is nondilated. No obvious lymph node enlargement. Reproductive: Status post hysterectomy. No adnexal masses. Other: Markedly limited by motion. No obvious fluid collections. No large amounts of free air. Musculoskeletal: Scattered degenerative changes of the spine and pelvis. Overall moderate to severe. IMPRESSION: Distended  gallbladder. If there is concern of gallbladder pathology ultrasound may be useful as the next step in the workup. Grossly stable left-sided renal cystic foci. Surgical changes of previous gastric bypass Markedly limited examination due to the level of motion. No obvious obstruction, free air or free fluid. In addition, a repeat study could be considered when the patient is more clinically able. Electronically Signed   By: Karen Kays M.D.   On: 01/08/2023 13:41   CT Head Wo Contrast  Result Date: 01/08/2023 CLINICAL DATA:  Altered mental status, weakness for 3 days, confusion EXAM: CT HEAD WITHOUT CONTRAST TECHNIQUE: Contiguous axial images were obtained from the base of the skull through the vertex without intravenous contrast. RADIATION DOSE REDUCTION: This exam was performed according to the departmental dose-optimization program which includes automated exposure control, adjustment of the mA and/or kV according to patient size and/or use of iterative reconstruction technique. COMPARISON:  01/03/2023 FINDINGS: Brain: No evidence of acute infarction, hemorrhage, hydrocephalus, extra-axial collection or mass lesion/mass effect. Mild periventricular white matter hypodensity. Vascular: No hyperdense vessel or unexpected calcification. Skull: Normal. Negative for fracture or focal lesion. Sinuses/Orbits: No acute finding. Other: None. IMPRESSION: No acute intracranial pathology.  Small-vessel white matter disease. Electronically Signed   By: Jearld Lesch M.D.   On: 01/08/2023 13:38   DG Chest Port 1 View  Result Date: 01/08/2023 CLINICAL DATA:  Altered mental status. EXAM: PORTABLE CHEST 1 VIEW COMPARISON:  01/03/2023 FINDINGS: Low lung volumes. Similar streaky density in the retrocardiac medial left base, likely vascular anatomy. No focal consolidation, pulmonary edema, or pleural effusion. Cardiopericardial silhouette is at upper limits of normal for size. No acute bony abnormality. Telemetry leads  overlie the chest. IMPRESSION: No active disease. Electronically Signed   By: Kennith Center M.D.   On: 01/08/2023 12:23    Scheduled Meds:  (feeding supplement) PROSource Plus  30 mL Oral BID BM   antiseptic oral rinse  15 mL Mouth Rinse BID   ascorbic acid  500 mg Oral Daily   colchicine  0.6 mg Oral Daily   docusate sodium  100 mg Oral BID   DULoxetine  60 mg Oral Daily   enoxaparin (LOVENOX) injection  40 mg Subcutaneous Q24H   ferrous sulfate  325 mg Oral Q breakfast   folic acid  1 mg Oral Daily   multivitamin with minerals  1 tablet Oral Daily   mouth rinse  15 mL Mouth Rinse 4 times per day   oxybutynin  15 mg Oral QHS   pantoprazole (PROTONIX) IV  40 mg Intravenous Q24H   senna  1 tablet Oral QHS   zinc sulfate  220 mg Oral Daily   Continuous Infusions:  cefTRIAXone (ROCEPHIN)  IV     dextrose  125 mL/hr at 01/09/23 0025   potassium chloride       LOS: 1 day   Time spent: 55 mins  Latorie Montesano Laural Benes, MD How to contact the [facility] Attending or Consulting provider 7A - 7P or covering provider during after hours 7P -7A, for this patient?  Check the care team in [facility] and look for a) attending/consulting TRH provider listed and b) the [facility] team listed Log into [email or web address] and use [place]'s universal password to access. If you do not have the password, please contact the hospital operator. Locate the [facility] provider you are looking for under [facility] and page to a number that you can be directly reached. If you still have difficulty reaching the provider, please page the [facility] (Director on Call) for the Hospitalists listed on amion for assistance.  01/09/2023, 11:49 AM

## 2023-01-09 NOTE — Progress Notes (Signed)
Lab informed me that stool amount was not sufficient for GI panel, but C diff was neg. Pt did not have a bowel movement during the night for another sample.

## 2023-01-10 DIAGNOSIS — R627 Adult failure to thrive: Secondary | ICD-10-CM | POA: Diagnosis not present

## 2023-01-10 DIAGNOSIS — E870 Hyperosmolality and hypernatremia: Secondary | ICD-10-CM | POA: Diagnosis not present

## 2023-01-10 DIAGNOSIS — E860 Dehydration: Secondary | ICD-10-CM | POA: Diagnosis not present

## 2023-01-10 DIAGNOSIS — R109 Unspecified abdominal pain: Secondary | ICD-10-CM | POA: Diagnosis not present

## 2023-01-10 LAB — COMPREHENSIVE METABOLIC PANEL
ALT: 24 U/L (ref 0–44)
AST: 27 U/L (ref 15–41)
Albumin: 2.8 g/dL — ABNORMAL LOW (ref 3.5–5.0)
Alkaline Phosphatase: 194 U/L — ABNORMAL HIGH (ref 38–126)
Anion gap: 12 (ref 5–15)
BUN: 48 mg/dL — ABNORMAL HIGH (ref 8–23)
CO2: 17 mmol/L — ABNORMAL LOW (ref 22–32)
Calcium: 8.9 mg/dL (ref 8.9–10.3)
Chloride: 122 mmol/L — ABNORMAL HIGH (ref 98–111)
Creatinine, Ser: 1.24 mg/dL — ABNORMAL HIGH (ref 0.44–1.00)
GFR, Estimated: 47 mL/min — ABNORMAL LOW (ref >60)
Glucose, Bld: 74 mg/dL (ref 70–99)
Potassium: 3.5 mmol/L (ref 3.5–5.1)
Sodium: 151 mmol/L — ABNORMAL HIGH (ref 135–145)
Total Bilirubin: 0.7 mg/dL (ref 0.3–1.2)
Total Protein: 6.3 g/dL — ABNORMAL LOW (ref 6.5–8.1)

## 2023-01-10 LAB — CBC
HCT: 31.8 % — ABNORMAL LOW (ref 36.0–46.0)
Hemoglobin: 9.8 g/dL — ABNORMAL LOW (ref 12.0–15.0)
MCH: 27.1 pg (ref 26.0–34.0)
MCHC: 30.8 g/dL (ref 30.0–36.0)
MCV: 88.1 fL (ref 80.0–100.0)
Platelets: 212 10*3/uL (ref 150–400)
RBC: 3.61 MIL/uL — ABNORMAL LOW (ref 3.87–5.11)
RDW: 22.1 % — ABNORMAL HIGH (ref 11.5–15.5)
WBC: 7.1 10*3/uL (ref 4.0–10.5)
nRBC: 0 % (ref 0.0–0.2)

## 2023-01-10 LAB — MAGNESIUM: Magnesium: 2 mg/dL (ref 1.7–2.4)

## 2023-01-10 LAB — GLUCOSE, CAPILLARY: Glucose-Capillary: 76 mg/dL (ref 70–99)

## 2023-01-10 MED ORDER — GLUCERNA SHAKE PO LIQD
237.0000 mL | Freq: Three times a day (TID) | ORAL | Status: DC
  Administered 2023-01-10 – 2023-01-18 (×12): 237 mL via ORAL

## 2023-01-10 MED ORDER — ADULT MULTIVITAMIN W/MINERALS CH
1.0000 | ORAL_TABLET | Freq: Two times a day (BID) | ORAL | Status: DC
  Administered 2023-01-10 – 2023-01-12 (×4): 1 via ORAL
  Filled 2023-01-10 (×4): qty 1

## 2023-01-10 MED ORDER — CALCIUM CARBONATE ANTACID 500 MG PO CHEW
1.0000 | CHEWABLE_TABLET | Freq: Three times a day (TID) | ORAL | Status: DC
  Administered 2023-01-10 – 2023-01-11 (×4): 200 mg via ORAL
  Filled 2023-01-10 (×5): qty 1

## 2023-01-10 MED ORDER — SODIUM BICARBONATE 650 MG PO TABS
650.0000 mg | ORAL_TABLET | Freq: Three times a day (TID) | ORAL | Status: DC
  Administered 2023-01-10 – 2023-01-14 (×12): 650 mg via ORAL
  Filled 2023-01-10 (×18): qty 1

## 2023-01-10 MED ORDER — AMLODIPINE BESYLATE 5 MG PO TABS
10.0000 mg | ORAL_TABLET | Freq: Every day | ORAL | Status: DC
  Administered 2023-01-10 – 2023-01-14 (×5): 10 mg via ORAL
  Filled 2023-01-10 (×8): qty 2

## 2023-01-10 NOTE — Progress Notes (Signed)
Initial Nutrition Assessment  DOCUMENTATION CODES:   Not applicable  INTERVENTION:   Carbohydrate Modified diet. Prosource Plus 30 ml PO BID, each packet provides 100 kcal and 15 gm protein. Glucerna Shake po TID, each supplement provides 220 kcal and 10 grams of protein.  Increase MVI to BID d/t hx Roux-en-Y gastric bypass. Tums 1 tablet TID for calcium supplement d/t hx Roux-en-Y gastric bypass.  NUTRITION DIAGNOSIS:   Increased nutrient needs related to wound healing as evidenced by estimated needs.  GOAL:   Patient will meet greater than or equal to 90% of their needs  MONITOR:   PO intake, Supplement acceptance  REASON FOR ASSESSMENT:   Malnutrition Screening Tool    ASSESSMENT:   72 yo female admitted with severe dehydration, AKI. 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  Patient states that she has been eating poorly because she's only being offered liquids. PTA she was able to eat all solid foods. After gastric bypass, she was instructed to eat a lot of protein, but unsure if she was able to do this at the nursing facility. She was admitted with severe dehydration with recent poor oral intake of food and beverages. She had also been refusing medications PTA. Patient would like to try more solid foods, MD advancing diet to carb modified today. Will continue protein supplements and add Ensure supplements between meals.   Patient has lost a lot of weight recently d/t gastric bypass surgery in May 2024. Pre-op weight 294.8 lbs. Current weight 226.2 lbs. She has some mild muscle depletion, but this could be related to decreased mobility in addition to poor oral intake.   Labs reviewed. Na 151, BUN 48, creat 1.24 Vitamin D, 25-Hydroxy 29.9 L Folate 11.5 WNL Vitamin B12 1,183 H CBG: 104-76  Medications reviewed and include vitamin C, Colace, ferrous sulfate, folic acid, MVI with minerals, zinc sulfate.  IVF: D5 at 150 ml/h    NUTRITION - FOCUSED PHYSICAL EXAM:  Flowsheet Row Most Recent Value  Orbital Region No depletion  Upper Arm Region No depletion  Thoracic and Lumbar Region No depletion  Buccal Region No depletion  Temple Region Mild depletion  Clavicle Bone Region No depletion  Clavicle and Acromion Bone Region No depletion  Scapular Bone Region No depletion  Dorsal Hand Mild depletion  Patellar Region No depletion  Anterior Thigh Region No depletion  Posterior Calf Region Mild depletion  Edema (RD Assessment) Mild  Hair Reviewed  Eyes Reviewed  Mouth Reviewed  Skin Reviewed  Nails Reviewed       Diet Order:   Diet Order             Diet Carb Modified Fluid consistency: Thin; Room service appropriate? Yes  Diet effective now                   EDUCATION NEEDS:   Education needs have been addressed  Skin:  Skin Assessment: Skin Integrity Issues: Skin Integrity Issues:: Stage II, Stage III Stage II: coccyx, 8 areas to L & R buttocks, L thigh Stage III: 2 areas to R buttocks  Last BM:  9/16  Height:   Ht Readings from Last 1 Encounters:  01/08/23 5\' 1"  (1.549 m)    Weight:   Wt Readings from Last 1 Encounters:  01/10/23 102.6 kg    BMI:  Body mass index is 42.74 kg/m.  Estimated Nutritional Needs:   Kcal:  1500-1800  Protein:  90-110 gm  Fluid:  1.5-2 L   Gabriel Rainwater RD, LDN, CNSC Please refer to Amion for contact information.

## 2023-01-10 NOTE — Progress Notes (Addendum)
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 Dossie Der.  She has been progressively declining since surgery.  She has had multiple hospitalizations and ED visits in past several months.  Recently seen at [facility] on 01/03/23 with complaints of weakness and poor oral intake.  She was hospitalized in July and August of this year at different facilities for dehydration, weakness, acute renal failure and failure to thrive.  She has been residing in a SNF.  She apparently has not been eating or drinking and refusing to take her medications.  She was sent to ER today for evaluation of altered mentation.  She has had 3 days of progressive weakness and refusing oral intake.  She complains of pain all over body which is not new per record review. Pt is a poor historian.   She was found to be severely dehydrated with hypoglycemia and hypernatremic with a sodium of 157.  She also had acute kidney injury and distended gallbladder on CT scan.  CT head with no acute findings.  Pt was started on IV hydration and admission was requested.      Assessment and Plan:  Hypernatremia / Severe Dehydration  - pt presented with severe clinical findings of free water deficit  - continue D5W infusion to give free water - sodium slowing trending down but remains high at 151  - monitor sodium levels daily  - increasing rate of D5W to 150 mL/hr    Hypoglycemia  - from poor oral intake and recent gastric bypass  - monitor closely as these gastric bypass patients sometimes suffer from postprandial hypoglycemia which can be quite severe  - monitor CBG  regularly - continue dextrose infusion with free water    AKI  - prerenal from severe dehydration  - continue IV fluid hydration as ordered  - renal function slowly improving with hydration  - renally dose medications as needed    Acute metabolic encephalopathy - secondary to severe hypernatremia  - starting to improve with proper hydration  - given gastric bypass, checked B12, folic acid, vitamin D levels-- reassuring levels   Generalized Abdominal Pain with gallbladder distension/dilation - given poor oral intake we need to make sure she is not having issues with gallbladder - abdominal US shows distended gallbladder with minimal sludge - slowly advancing diet and she seems to be tolerating with no further complaint of abd pain - follow clinically  Metabolic acidosis - added sodium bicarb tablets 9/16   OSA  - nightly CPAP ordered    Essential hypertension  - holding home antihypertensives until she has been better rehydrated  - pt had been refusing all of her meds for past several days at SNF per report - BPs were initially soft but now rebounding - restarted amlodipine 10 mg daily on 9/16     Generalized weakness  - secondary to acute metabolic derangements discussed above - PT eval when more medically stable    Morbid Obesity  - pt is postop s/p 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 Dossie Der. - her preop weight was 294.8 # and her 2nd postop visit she weighed 261.6# - current weight 226.2 #  -  continue vitamin supplements    GERD - IV pantoprazole ordered for GI protection until she can take oral again   Adult Failure to Thrive - if no improvement, would recommend palliative consultation given precipitous decline after recent major surgery - pt reports her sister is planning to come here to visit    DVT prophylaxis: enoxaparin Code Status: full  Family Communication: t/c to sister, no answer 01/09/23 Disposition: anticipate return to SNF  STR   Consultants:   Procedures:   Antimicrobials:   Ceftriaxone 9/15>>  Subjective: Wanting to try advancing diet today; still intermittently confused    Objective: Vitals:   01/09/23 1300 01/09/23 2122 01/10/23 0508 01/10/23 1236  BP: 135/71 (!) 140/69 (!) 146/71 (!) 142/73  Pulse: 78 81 94 90  Resp: 16 16 20 18   Temp: 98 F (36.7 C) 98.9 F (37.2 C) 98.9 F (37.2 C) 98.8 F (37.1 C)  TempSrc: Oral   Oral  SpO2: 100% 100% 100% 98%  Weight:   102.6 kg   Height:        Intake/Output Summary (Last 24 hours) at 01/10/2023 1301 Last data filed at 01/10/2023 0515 Gross per 24 hour  Intake --  Output 300 ml  Net -300 ml   Filed Weights   01/09/23 0510 01/10/23 0508  Weight: 101.5 kg 102.6 kg   Examination:  General exam: able to speak and interact but is confused, slowed speech;  much improved dry mucus membranes. Respiratory system: Clear to auscultation. Respiratory effort normal. Cardiovascular system: normal S1 & S2 heard. No JVD, murmurs, rubs, gallops or clicks. No pedal edema. Gastrointestinal system: Abdomen is morbidly obese, nondistended, soft and nontender. No organomegaly or masses felt. Normal bowel sounds heard. Central nervous system: Alert and disoriented. No focal neurological deficits. Extremities: Symmetric 5 x 5 power. Skin: No rashes, lesions or ulcers. Psychiatry: Judgement and insight appear poor. Mood & affect flat.   Data Reviewed: I have personally reviewed following labs and imaging studies  CBC: Recent Labs  Lab 01/08/23 1200 01/09/23 0423 01/10/23 0549  WBC 7.4 8.2 7.1  NEUTROABS 4.1  --   --   HGB 11.5* 10.2* 9.8*  HCT 38.7 34.0* 31.8*  MCV 88.4 87.2 88.1  PLT 273 240 212    Basic Metabolic Panel: Recent Labs  Lab 01/08/23 1200 01/09/23 0423 01/10/23 0405  NA 157* 150* 151*  K 3.9 3.2* 3.5  CL 121* 121* 122*  CO2 20* 17* 17*  GLUCOSE 97 105* 74  BUN 76* 62* 48*  CREATININE 1.66* 1.32* 1.24*  CALCIUM 9.7 9.0 8.9   MG  --  2.0 2.0    CBG: Recent Labs  Lab 01/08/23 1247 01/08/23 1329 01/09/23 0512 01/09/23 0735 01/10/23 0648  GLUCAP 65* 122* 90 104* 76    Recent Results (from the past 240 hour(s))  SARS Coronavirus 2 by RT PCR (hospital order, performed in [facility] hospital lab) *cepheid single result test* Anterior Nasal Swab     Status: None   Collection Time: 01/08/23 12:18 PM   Specimen: Anterior Nasal Swab  Result Value Ref Range Status   SARS Coronavirus 2 by RT PCR NEGATIVE NEGATIVE Final    Comment: (NOTE) SARS-CoV-2 target nucleic acids are NOT DETECTED.  The SARS-CoV-2 RNA is generally detectable in upper and lower respiratory specimens during the acute phase of infection. The lowest concentration of SARS-CoV-2 viral copies this assay can detect is 250 copies / mL. A negative result does not preclude SARS-CoV-2 infection and should  not be used as the sole basis for treatment or other patient management decisions.  A negative result may occur with improper specimen collection / handling, submission of specimen other than nasopharyngeal swab, presence of viral mutation(s) within the areas targeted by this assay, and inadequate number of viral copies (<250 copies / mL). A negative result must be combined with clinical observations, patient history, and epidemiological information.  Fact Sheet for Patients:   RoadLapTop.co.za  Fact Sheet for Healthcare Providers: http://kim-miller.com/  This test is not yet approved or  cleared by the [place] FDA and has been authorized for detection and/or diagnosis of SARS-CoV-2 by FDA under an Emergency Use Authorization (EUA).  This EUA will remain in effect (meaning this test can be used) for the duration of the COVID-19 declaration under Section 564(b)(1) of the Act, 21 U.S.C. section 360bbb-3(b)(1), unless the authorization is terminated or revoked sooner.  Performed at [facility], [address] [place], [place] [address]   MRSA Next Gen by PCR, Nasal     Status: None   Collection Time: 01/08/23  5:50 PM   Specimen: Nasal Mucosa; Nasal Swab  Result Value Ref Range Status   MRSA by PCR Next Gen NOT DETECTED NOT DETECTED Final    Comment: (NOTE) The GeneXpert MRSA Assay (FDA approved for NASAL specimens only), is one component of a comprehensive MRSA colonization surveillance program. It is not intended to diagnose MRSA infection nor to guide or monitor treatment for MRSA infections. Test performance is not FDA approved in patients less than 32 years old. Performed at [facility], [address] [place], [place] [address]   C Difficile Quick Screen w PCR reflex     Status: None   Collection Time: 01/08/23  6:15 PM   Specimen: Stool  Result Value Ref Range Status   C Diff antigen NEGATIVE NEGATIVE Final   C Diff toxin NEGATIVE NEGATIVE Final   C Diff interpretation No C. difficile detected.  Final    Comment: Performed at [facility], [address], [place], [place] [address]     Radiology Studies: US Abdomen Complete  Result Date: 01/09/2023 CLINICAL DATA:  [id number] Gallbladder dilatation [id number] Generalized abdominal pain [id number] EXAM: ABDOMEN ULTRASOUND COMPLETE COMPARISON:  None Available. FINDINGS: Gallbladder: Gallbladder appears distended. Echogenic structure in the gallbladder is consistent with sludge. No shadowing gallstones or wall thickening visualized. No sonographic Murphy sign noted by sonographer. Common bile duct: Diameter: 0.4cm. Liver: No focal lesion identified. Hyperechoic consistent with fatty infiltration. IVC: No abnormality visualized. Pancreas: Obscured by bowel gas. Spleen: Size and appearance within normal limits. Right Kidney: Length: 10.6 cm. Echogenicity increased consistent with chronic medical renal disease. No mass or hydronephrosis visualized. No shadowing stones. Left Kidney: Length: 9.6cm. Echogenicity increased  consistent with chronic medical renal disease. Cyst measures 5.5 cm. No follow up recommended based on the ultrasound appearance of the cyst. No solid mass or hydronephrosis visualized. No shadowing stones. Abdominal aorta: Obscured by bowel gas. IMPRESSION: 1. Hepatic fatty infiltration. 2. Gallbladder distended. 3. Minimal gallbladder sludge. 4. Echogenic kidneys consistent with chronic medical renal disease. 5. Left kidney cyst. 6. There was limited visualization, as described. Electronically Signed   By: Layla Maw M.D.   On: 01/09/2023 11:17   CT ABDOMEN PELVIS WO CONTRAST  Result Date: 01/08/2023 CLINICAL DATA:  Abdominal pain. EXAM: CT ABDOMEN AND PELVIS WITHOUT CONTRAST TECHNIQUE: Multidetector CT imaging of the abdomen and pelvis was performed following the standard protocol without IV contrast. RADIATION  DOSE REDUCTION: This exam was performed according to the departmental dose-optimization program which includes automated exposure control, adjustment of the mA and/or kV according to patient size and/or use of iterative reconstruction technique. COMPARISON:  CT 10/15/2022 and older FINDINGS: Evaluation significant limited by motion despite multiple attempts. As per the CT technologist, the patient had difficulty with the breathing instructions. Lower chest: Lung bases are limited by motion. There is some basilar atelectasis. Coronary artery calcifications are seen. Hepatobiliary: Grossly preserved parenchyma. Gallbladder is dilated. Pancreas: No obvious pancreatic mass. Spleen: Spleen is nonenlarged. Adrenals/Urinary Tract: Nodularity of the adrenal glands, poorly defined with the motion. No renal collecting system dilatation. No obvious obstructing stones. There is some cystic lesions in the left kidney including a larger lower pole focus with some septations and calcifications. Again evaluation is limited but appears similar to previous. Mildly distended urinary bladder. Stomach/Bowel: Surgical  changes gastric bypass. The stomach, small and large bowel are nondilated. There are some scattered stool. Details of the bowel are limited. Vascular/Lymphatic: Scattered vascular calcifications. No obvious dilatation of the abdominal aorta. IVC is nondilated. No obvious lymph node enlargement. Reproductive: Status post hysterectomy. No adnexal masses. Other: Markedly limited by motion. No obvious fluid collections. No large amounts of free air. Musculoskeletal: Scattered degenerative changes of the spine and pelvis. Overall moderate to severe. IMPRESSION: Distended gallbladder. If there is concern of gallbladder pathology ultrasound may be useful as the next step in the workup. Grossly stable left-sided renal cystic foci. Surgical changes of previous gastric bypass Markedly limited examination due to the level of motion. No obvious obstruction, free air or free fluid. In addition, a repeat study could be considered when the patient is more clinically able. Electronically Signed   By: Karen Kays M.D.   On: 01/08/2023 13:41   CT Head Wo Contrast  Result Date: 01/08/2023 CLINICAL DATA:  Altered mental status, weakness for 3 days, confusion EXAM: CT HEAD WITHOUT CONTRAST TECHNIQUE: Contiguous axial images were obtained from the base of the skull through the vertex without intravenous contrast. RADIATION DOSE REDUCTION: This exam was performed according to the departmental dose-optimization program which includes automated exposure control, adjustment of the mA and/or kV according to patient size and/or use of iterative reconstruction technique. COMPARISON:  01/03/2023 FINDINGS: Brain: No evidence of acute infarction, hemorrhage, hydrocephalus, extra-axial collection or mass lesion/mass effect. Mild periventricular white matter hypodensity. Vascular: No hyperdense vessel or unexpected calcification. Skull: Normal. Negative for fracture or focal lesion. Sinuses/Orbits: No acute finding. Other: None. IMPRESSION: No  acute intracranial pathology.  Small-vessel white matter disease. Electronically Signed   By: Jearld Lesch M.D.   On: 01/08/2023 13:38    Scheduled Meds:  (feeding supplement) PROSource Plus  30 mL Oral BID BM   antiseptic oral rinse  15 mL Mouth Rinse BID   ascorbic acid  500 mg Oral Daily   colchicine  0.6 mg Oral Daily   docusate sodium  100 mg Oral BID   DULoxetine  60 mg Oral Daily   enoxaparin (LOVENOX) injection  40 mg Subcutaneous Q24H   ferrous sulfate  325 mg Oral Q breakfast   folic acid  1 mg Oral Daily   multivitamin with minerals  1 tablet Oral Daily   mouth rinse  15 mL Mouth Rinse 4 times per day   oxybutynin  15 mg Oral QHS   pantoprazole (PROTONIX) IV  40 mg Intravenous Q24H   senna  1 tablet Oral QHS   zinc sulfate  220 mg  Oral Daily   Continuous Infusions:  cefTRIAXone (ROCEPHIN)  IV 1 g (01/10/23 1055)   dextrose 125 mL/hr at 01/09/23 0025    LOS: 2 days   Time spent: 52 mins  Harish Bram Laural Benes, MD How to contact the [facility] Attending or Consulting provider 7A - 7P or covering provider during after hours 7P -7A, for this patient?  Check the care team in [facility] and look for a) attending/consulting [facility] provider listed and b) the [facility] team listed Log into [email or web address] and use [place]'s universal password to access. If you do not have the password, please contact the hospital operator. Locate the [facility] provider you are looking for under [facility] and page to a number that you can be directly reached. If you still have difficulty reaching the provider, please page the [facility] (Director on Call) for the Hospitalists listed on amion for assistance.  01/10/2023, 1:01 PM

## 2023-01-11 ENCOUNTER — Other Ambulatory Visit: Payer: Self-pay

## 2023-01-11 DIAGNOSIS — R109 Unspecified abdominal pain: Secondary | ICD-10-CM | POA: Diagnosis not present

## 2023-01-11 DIAGNOSIS — E870 Hyperosmolality and hypernatremia: Secondary | ICD-10-CM | POA: Diagnosis not present

## 2023-01-11 DIAGNOSIS — E860 Dehydration: Secondary | ICD-10-CM | POA: Diagnosis not present

## 2023-01-11 DIAGNOSIS — R627 Adult failure to thrive: Secondary | ICD-10-CM | POA: Diagnosis not present

## 2023-01-11 LAB — BASIC METABOLIC PANEL
Anion gap: 10 (ref 5–15)
BUN: 37 mg/dL — ABNORMAL HIGH (ref 8–23)
CO2: 19 mmol/L — ABNORMAL LOW (ref 22–32)
Calcium: 8.7 mg/dL — ABNORMAL LOW (ref 8.9–10.3)
Chloride: 115 mmol/L — ABNORMAL HIGH (ref 98–111)
Creatinine, Ser: 1.19 mg/dL — ABNORMAL HIGH (ref 0.44–1.00)
GFR, Estimated: 49 mL/min — ABNORMAL LOW (ref >60)
Glucose, Bld: 97 mg/dL (ref 70–99)
Potassium: 3 mmol/L — ABNORMAL LOW (ref 3.5–5.1)
Sodium: 144 mmol/L (ref 135–145)

## 2023-01-11 LAB — CBC
HCT: 33.6 % — ABNORMAL LOW (ref 36.0–46.0)
Hemoglobin: 10.1 g/dL — ABNORMAL LOW (ref 12.0–15.0)
MCH: 26.5 pg (ref 26.0–34.0)
MCHC: 30.1 g/dL (ref 30.0–36.0)
MCV: 88.2 fL (ref 80.0–100.0)
Platelets: 222 10*3/uL (ref 150–400)
RBC: 3.81 MIL/uL — ABNORMAL LOW (ref 3.87–5.11)
RDW: 21.8 % — ABNORMAL HIGH (ref 11.5–15.5)
WBC: 7 10*3/uL (ref 4.0–10.5)
nRBC: 0.3 % — ABNORMAL HIGH (ref 0.0–0.2)

## 2023-01-11 LAB — GLUCOSE, CAPILLARY
Glucose-Capillary: 86 mg/dL (ref 70–99)
Glucose-Capillary: 92 mg/dL (ref 70–99)
Glucose-Capillary: 115 mg/dL — ABNORMAL HIGH (ref 70–99)

## 2023-01-11 MED ORDER — POTASSIUM CHLORIDE 10 MEQ/100ML IV SOLN
10.0000 meq | INTRAVENOUS | Status: AC
  Administered 2023-01-11 (×4): 10 meq via INTRAVENOUS
  Filled 2023-01-11: qty 100

## 2023-01-11 NOTE — Plan of Care (Signed)
  Problem: Acute Rehab PT Goals(only PT should resolve) Goal: Pt Will Go Supine/Side To Sit Outcome: Progressing Flowsheets (Taken 01/11/2023 1547) Pt will go Supine/Side to Sit: with minimal assist Goal: Patient Will Transfer Sit To/From Stand Outcome: Progressing Flowsheets (Taken 01/11/2023 1547) Patient will transfer sit to/from stand: with minimal assist Goal: Pt Will Transfer Bed To Chair/Chair To Bed Outcome: Progressing Flowsheets (Taken 01/11/2023 1547) Pt will Transfer Bed to Chair/Chair to Bed: with min assist Goal: Pt Will Ambulate Outcome: Progressing Flowsheets (Taken 01/11/2023 1547) Pt will Ambulate:  25 feet  with minimal assist  with moderate assist  with rolling walker   3:47 PM, 01/11/23 Ocie Bob, MPT Physical Therapist with [facility] [phone] office [phone] mobile phone

## 2023-01-11 NOTE — Evaluation (Signed)
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 Dossie Der.  She has been progressively declining since surgery.  She has had multiple hospitalizations and ED visits in past several months.  Recently seen at [facility] on 01/03/23 with complaints of weakness and poor oral intake.  She was hospitalized in July and August of this year at different facilities for dehydration, weakness, acute renal failure and failure to thrive.  She has been residing in a SNF.  She apparently has not been eating or drinking and refusing to take her medications.  She was sent to ER today for evaluation of altered mentation.  She has had 3 days of progressive weakness and refusing oral intake.  She complains of pain all over body which is not new per record review. Pt is a poor historian.   She was found to be severely dehydrated with hypoglycemia and hypernatremic with a sodium of 157.  She also had acute kidney injury and distended gallbladder on CT scan.  CT head with no acute findings.  Pt was started on IV hydration and admission was requested.   Clinical Impression  Patient demonstrates slow labored movement for sitting up at bedside requiring HOB raised, Min/mod assist for pulling self to sitting and c/o pain over buttocks when scooting to EOB.  Patient required increased time for completing sit to stands and able to take a few side steps at bedside before having to sit due to fatigue/weakness and fall risk.  Patient tolerated sitting up in chair after therapy with her sister present.  Patient will benefit from continued skilled physical therapy in hospital and recommended venue below to increase strength, balance, endurance for safe  ADLs and gait.           If plan is discharge home, recommend the following: A lot of help with walking and/or transfers;A lot of help with bathing/dressing/bathroom;Help with stairs or ramp for entrance;Assistance with cooking/housework   Can travel by private vehicle   No    Equipment Recommendations Rolling walker (2 wheels)  Recommendations for Other Services       Functional Status Assessment Patient has had a recent decline in their functional status and demonstrates the ability to make significant improvements in function in a reasonable and predictable amount of time.     Precautions / Restrictions Precautions Precautions: Fall Restrictions Weight Bearing Restrictions: No      Mobility  Bed Mobility Overal bed mobility: Needs Assistance Bed Mobility: Supine to Sit     Supine to sit: Min assist, Mod assist, HOB elevated     General bed mobility comments: increased time, labored movement    Transfers Overall transfer level: Needs assistance Equipment used: Rolling walker (2 wheels) Transfers: Sit to/from Stand, Bed to chair/wheelchair/BSC Sit to Stand: Min assist, Mod assist   Step pivot transfers: Min assist, Mod assist       General transfer comment: has difficulty completing sit to stands due to BLE weakness    Ambulation/Gait Ambulation/Gait assistance: Mod assist Gait Distance (Feet): 5 Feet Assistive device: Rolling walker (2 wheels) Gait Pattern/deviations: Decreased step length - right, Decreased step length - left, Decreased stride length Gait velocity: slow     General Gait Details: limited to a few slow labored unsteady side steps  before having to sit due to fatigue  Stairs            Wheelchair Mobility     Tilt Bed    Modified Rankin (Stroke Patients Only)       Balance Overall balance assessment: Needs assistance Sitting-balance support: Feet supported, No upper extremity supported Sitting balance-Leahy Scale:  Good Sitting balance - Comments: seated at EOB   Standing balance support: Reliant on assistive device for balance, During functional activity, Bilateral upper extremity supported Standing balance-Leahy Scale: Poor Standing balance comment: fair/poor using RW                             Pertinent Vitals/Pain Pain Assessment Pain Assessment: Faces Faces Pain Scale: Hurts even more Pain Location: over bottom when scooting to edge of bed Pain Descriptors / Indicators: Sore, Grimacing, Discomfort Pain Intervention(s): Limited activity within patient's tolerance, Monitored during session, Repositioned    Home Living Family/patient expects to be discharged to:: Private residence Living Arrangements: Alone Available Help at Discharge: Neighbor;Available PRN/intermittently Type of Home: House Home Access: Stairs to enter Entrance Stairs-Rails: Can reach both;Right;Left Entrance Stairs-Number of Steps: 4   Home Layout: One level Home Equipment: Cane - single point Additional Comments: Pt is considering moving back to [place] with family into retirement community    Prior Function Prior Level of Function : Independent/Modified Independent;Driving             Mobility Comments: Furniture walks within the house, uses [facility] for limited community mobility ADLs Comments: Mod I with ADL/IADLs, driving, and shopping     Extremity/Trunk Assessment   Upper Extremity Assessment Upper Extremity Assessment: Generalized weakness    Lower Extremity Assessment Lower Extremity Assessment: Generalized weakness    Cervical / Trunk Assessment Cervical / Trunk Assessment: Normal  Communication   Communication Communication: No apparent difficulties  Cognition Arousal: Alert Behavior During Therapy: WFL for tasks assessed/performed Overall Cognitive Status: Within Functional Limits for tasks assessed                                          General Comments       Exercises     Assessment/Plan    PT Assessment Patient needs continued PT services  PT Problem List Decreased strength;Decreased activity tolerance;Decreased balance;Decreased mobility       PT Treatment Interventions DME instruction;Gait training;Stair training;Functional mobility training;Therapeutic activities;Therapeutic exercise;Balance training;Patient/family education    PT Goals (Current goals can be found in the Care Plan section)  Acute Rehab PT Goals Patient Stated Goal: return home with family to assist PT Goal Formulation: With patient/family Time For Goal Achievement: 01/25/23 Potential to Achieve Goals: Good    Frequency Min 3X/week     Co-evaluation               AM-PAC PT "6 Clicks" Mobility  Outcome Measure Help needed turning from your back to your side while in a flat bed without using bedrails?: A Little Help needed moving from lying on your back to sitting on the side of a flat bed without using bedrails?: A Lot Help needed moving to and from a bed to a chair (including a wheelchair)?: A Little Help needed standing up from a chair using your arms (e.g., wheelchair or bedside chair)?: A Lot Help needed to walk in hospital room?: A Lot  Help needed climbing 3-5 steps with a railing? : A Lot 6 Click Score: 14    End of Session   Activity Tolerance: Patient tolerated treatment well;Patient limited by fatigue Patient left: in chair;with call bell/phone within reach Nurse Communication: Mobility status PT Visit Diagnosis: Unsteadiness on feet (R26.81);Other abnormalities of gait and mobility (R26.89);Muscle weakness (generalized) (M62.81)    Time: 1440-1510 PT Time Calculation (min) (ACUTE ONLY): 30 min   Charges:   PT Evaluation $PT Eval Moderate Complexity: 1 Mod PT Treatments $Therapeutic Activity: 23-37 mins PT General Charges $$ ACUTE PT VISIT: 1 Visit         3:45 PM, 01/11/23 Ocie Bob, MPT Physical Therapist with  [facility] [phone] office [phone] mobile phone

## 2023-01-11 NOTE — Plan of Care (Signed)
  Problem: Education: Goal: Ability to state signs and symptoms to report to health care provider will improve Outcome: Progressing Goal: Knowledge of the prescribed self-care regimen will improve Outcome: Progressing Goal: Knowledge of discharge needs will improve Outcome: Progressing

## 2023-01-11 NOTE — Progress Notes (Addendum)
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 Dossie Der.  She has been progressively declining since surgery.  She has had multiple hospitalizations and ED visits in past several months.  Recently seen at [facility] on 01/03/23 with complaints of weakness and poor oral intake.  She was hospitalized in July and August of this year at different facilities for dehydration, weakness, acute renal failure and failure to thrive.  She has been residing in a SNF.  She apparently has not been eating or drinking and refusing to take her medications.  She was sent to ER today for evaluation of altered mentation.  She has had 3 days of progressive weakness and refusing oral intake.  She complains of pain all over body which is not new per record review. Pt is a poor historian.   She was found to be severely dehydrated with hypoglycemia and hypernatremic with a sodium of 157.  She also had acute kidney injury and distended gallbladder on CT scan.  CT head with no acute findings.  Pt was started on IV hydration and admission was requested.      Assessment and Plan:  Hypernatremia / Severe Dehydration  - pt presented with severe clinical findings of free water deficit  - continue D5W infusion to give free water - sodium slowing trending down   - monitor sodium levels daily  - reducing rate of D5W to 70 mL/hr    Hypoglycemia  - from poor oral intake and recent gastric bypass  - monitor closely as these gastric bypass patients sometimes suffer from postprandial hypoglycemia which can be quite severe  - monitor CBG regularly - continue dextrose  infusion with free water   Hypokalemia - IV replacement ordered - recheck BMP in AM with Mg   AKI  - prerenal from severe dehydration  - continue IV fluid hydration as ordered  - renal function slowly improving with hydration  - renally dose medications as needed    Acute metabolic encephalopathy - secondary to severe hypernatremia  - starting to improve with proper hydration  - given gastric bypass, checked B12, folic acid, vitamin D levels-- reassuring levels   Generalized Abdominal Pain with gallbladder distension/dilation - given poor oral intake we need to make sure she is not having issues with gallbladder - abdominal US shows distended gallbladder with minimal sludge - slowly advancing diet and she seems to be tolerating with no further complaint of abd pain - follow clinically  Metabolic acidosis - added sodium bicarb tablets 9/16   OSA  - nightly CPAP ordered    Essential hypertension  - initially held home antihypertensives until she has been better rehydrated  - pt had been refusing all of her meds for past several days  - BPs now rebounding after hydration - restarted amlodipine 10 mg daily on 9/16     Generalized weakness  - secondary to acute metabolic derangements discussed above - PT eval requested 9/17    Morbid Obesity  - pt is postop s/p 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 Dossie Der. - her preop weight was 294.8 # and her 2nd postop visit she weighed 261.6# -  current weight 226.2 #  - continue vitamin supplements    GERD - IV pantoprazole ordered for GI protection until she can take oral again   Adult Failure to Thrive - if no improvement, would recommend palliative consultation given precipitous decline after recent major surgery - pt's sister traveled by train from [place] to come here to visit and help with arrangements for post hospital care  Sacral pressure wounds - noted below, appreciate wound care nursing team  consultation   DVT prophylaxis: enoxaparin Code Status: full  Family Communication: t/c to sister, no answer 01/09/23, sister bedside  Disposition: anticipate SNF    Consultants:   Procedures:   Antimicrobials:   Ceftriaxone 9/15>>9/17  Subjective: Pt remains intermittently confused but now more alert   Objective: Vitals:   01/10/23 1236 01/10/23 2104 01/11/23 0439 01/11/23 0500  BP: (!) 142/73 132/85 (!) 144/72   Pulse: 90 82 82   Resp: 18 18 18    Temp: 98.8 F (37.1 C) 98 F (36.7 C) 98.3 F (36.8 C)   TempSrc: Oral     SpO2: 98% 100% 100%   Weight:    102.8 kg  Height:        Intake/Output Summary (Last 24 hours) at 01/11/2023 1206 Last data filed at 01/11/2023 0900 Gross per 24 hour  Intake 1955 ml  Output 1200 ml  Net 755 ml   Filed Weights   01/09/23 0510 01/10/23 0508 01/11/23 0500  Weight: 101.5 kg 102.6 kg 102.8 kg   Examination:  General exam: able to speak and interact but is confused, slowed speech;  improved dry mucus membranes. Respiratory system: Clear to auscultation. Respiratory effort normal. Cardiovascular system: normal S1 & S2 heard. No JVD, murmurs, rubs, gallops or clicks. No pedal edema. Gastrointestinal system: Abdomen is morbidly obese, nondistended, soft and nontender. No organomegaly or masses felt. Normal bowel sounds heard. Central nervous system: Alert and disoriented. No focal neurological deficits. Extremities: Symmetric 5 x 5 power. Skin: No rashes, lesions or ulcers. Psychiatry: Judgement and insight appear poor. Mood & affect flat.  Pressure Injury 01/08/23 Coccyx Mid Stage 2 -  Partial thickness loss of dermis presenting as a shallow open injury with a red, pink wound bed without slough. (Active)  01/08/23 1715  Location: Coccyx  Location Orientation: Mid  Staging: Stage 2 -  Partial thickness loss of dermis presenting as a shallow open injury with a red, pink wound bed without slough.  Wound Description (Comments):    Present on Admission: Yes     Pressure Injury 01/08/23 Buttocks Right Stage 2 -  Partial thickness loss of dermis presenting as a shallow open injury with a red, pink wound bed without slough. 2 x 10 cm (appears healed stage 2) (Active)  01/08/23 1715  Location: Buttocks  Location Orientation: Right  Staging: Stage 2 -  Partial thickness loss of dermis presenting as a shallow open injury with a red, pink wound bed without slough.  Wound Description (Comments): 2 x 10 cm (appears healed stage 2)  Present on Admission: Yes     Pressure Injury 01/08/23 Buttocks Right Stage 2 -  Partial thickness loss of dermis presenting as a shallow open injury with a red, pink wound bed without slough. 2.5 x 5 cm stage 2 (Active)  01/08/23 1715  Location: Buttocks  Location Orientation: Right  Staging: Stage 2 -  Partial thickness loss of dermis presenting as a shallow open injury with a red, pink wound bed without slough.  Wound Description (Comments):  2.5 x 5 cm stage 2  Present on Admission: Yes     Pressure Injury 01/08/23 Thigh Left Stage 2 -  Partial thickness loss of dermis presenting as a shallow open injury with a red, pink wound bed without slough. 5 x 0.5 cm stage 2 to left inner thigh (Active)  01/08/23 1715  Location: Thigh  Location Orientation: Left  Staging: Stage 2 -  Partial thickness loss of dermis presenting as a shallow open injury with a red, pink wound bed without slough.  Wound Description (Comments): 5 x 0.5 cm stage 2 to left inner thigh  Present on Admission: Yes     Pressure Injury 01/08/23 Buttocks Left Stage 2 -  Partial thickness loss of dermis presenting as a shallow open injury with a red, pink wound bed without slough. 2 x 1 cm left buttock stage 2 (Active)  01/08/23 1715  Location: Buttocks  Location Orientation: Left  Staging: Stage 2 -  Partial thickness loss of dermis presenting as a shallow open injury with a red, pink wound bed without slough.  Wound Description  (Comments): 2 x 1 cm left buttock stage 2  Present on Admission: Yes     Pressure Injury 01/08/23 Buttocks Left Stage 2 -  Partial thickness loss of dermis presenting as a shallow open injury with a red, pink wound bed without slough. 1 x 1 cm stage 2s x 5 seperate areas noted to left buttocks (Active)  01/08/23 1715  Location: Buttocks  Location Orientation: Left  Staging: Stage 2 -  Partial thickness loss of dermis presenting as a shallow open injury with a red, pink wound bed without slough.  Wound Description (Comments): 1 x 1 cm stage 2s x 5 seperate areas noted to left buttocks  Present on Admission:      Pressure Injury 01/08/23 Buttocks Left Stage 2 -  Partial thickness loss of dermis presenting as a shallow open injury with a red, pink wound bed without slough. 3 x 2 cm stage 2 left buttocks (Active)  01/08/23 1715  Location: Buttocks  Location Orientation: Left  Staging: Stage 2 -  Partial thickness loss of dermis presenting as a shallow open injury with a red, pink wound bed without slough.  Wound Description (Comments): 3 x 2 cm stage 2 left buttocks  Present on Admission: Yes     Pressure Injury 01/08/23 Buttocks Left Stage 2 -  Partial thickness loss of dermis presenting as a shallow open injury with a red, pink wound bed without slough. 3 x 2 stage 2 to left buttocks (Active)  01/08/23 1715  Location: Buttocks  Location Orientation: Left  Staging: Stage 2 -  Partial thickness loss of dermis presenting as a shallow open injury with a red, pink wound bed without slough.  Wound Description (Comments): 3 x 2 stage 2 to left buttocks  Present on Admission: Yes     Pressure Injury 01/08/23 Buttocks Left;Upper Stage 2 -  Partial thickness loss of dermis presenting as a shallow open injury with a red, pink wound bed without slough. 05 x 0.5 left upper buttocks stage 2 (Active)  01/08/23 1715  Location: Buttocks  Location Orientation: Left;Upper  Staging: Stage 2 -  Partial  thickness loss of dermis presenting as a shallow open injury with a red, pink wound bed without slough.  Wound Description (Comments): 05 x 0.5 left upper buttocks stage 2  Present on Admission: Yes     Pressure Injury 01/08/23 Buttocks Right Stage 3 -  Full thickness tissue loss. Subcutaneous fat may be visible but bone, tendon or muscle are NOT exposed. 5 x 2 stage 3 (Active)  01/08/23 1715  Location: Buttocks  Location Orientation: Right  Staging: Stage 3 -  Full thickness tissue loss. Subcutaneous fat may be visible but bone, tendon or muscle are NOT exposed.  Wound Description (Comments): 5 x 2 stage 3  Present on Admission: Yes     Pressure Injury 01/08/23 Buttocks Right Stage 3 -  Full thickness tissue loss. Subcutaneous fat may be visible but bone, tendon or muscle are NOT exposed. 2 x 1 stage 3 (Active)  01/08/23 1715  Location: Buttocks  Location Orientation: Right  Staging: Stage 3 -  Full thickness tissue loss. Subcutaneous fat may be visible but bone, tendon or muscle are NOT exposed.  Wound Description (Comments): 2 x 1 stage 3  Present on Admission: Yes     Pressure Injury 01/08/23 Buttocks Left;Upper Stage 2 -  Partial thickness loss of dermis presenting as a shallow open injury with a red, pink wound bed without slough. pinpoint stage 2 (unable to measure) (Active)  01/08/23 1715  Location: Buttocks  Location Orientation: Left;Upper  Staging: Stage 2 -  Partial thickness loss of dermis presenting as a shallow open injury with a red, pink wound bed without slough.  Wound Description (Comments): pinpoint stage 2 (unable to measure)  Present on Admission: Yes     Data Reviewed: I have personally reviewed following labs and imaging studies  CBC: Recent Labs  Lab 01/08/23 1200 01/09/23 0423 01/10/23 0549 01/11/23 0518  WBC 7.4 8.2 7.1 7.0  NEUTROABS 4.1  --   --   --   HGB 11.5* 10.2* 9.8* 10.1*  HCT 38.7 34.0* 31.8* 33.6*  MCV 88.4 87.2 88.1 88.2  PLT 273 240  212 222    Basic Metabolic Panel: Recent Labs  Lab 01/08/23 1200 01/09/23 0423 01/10/23 0405 01/11/23 0518  NA 157* 150* 151* 144  K 3.9 3.2* 3.5 3.0*  CL 121* 121* 122* 115*  CO2 20* 17* 17* 19*  GLUCOSE 97 105* 74 97  BUN 76* 62* 48* 37*  CREATININE 1.66* 1.32* 1.24* 1.19*  CALCIUM 9.7 9.0 8.9 8.7*  MG  --  2.0 2.0  --     CBG: Recent Labs  Lab 01/09/23 0512 01/09/23 0735 01/10/23 0648 01/11/23 0730 01/11/23 1124  GLUCAP 90 104* 76 86 92    Recent Results (from the past 240 hour(s))  SARS Coronavirus 2 by RT PCR (hospital order, performed in [facility] hospital lab) *cepheid single result test* Anterior Nasal Swab     Status: None   Collection Time: 01/08/23 12:18 PM   Specimen: Anterior Nasal Swab  Result Value Ref Range Status   SARS Coronavirus 2 by RT PCR NEGATIVE NEGATIVE Final    Comment: (NOTE) SARS-CoV-2 target nucleic acids are NOT DETECTED.  The SARS-CoV-2 RNA is generally detectable in upper and lower respiratory specimens during the acute phase of infection. The lowest concentration of SARS-CoV-2 viral copies this assay can detect is 250 copies / mL. A negative result does not preclude SARS-CoV-2 infection and should not be used as the sole basis for treatment or other patient management decisions.  A negative result may occur with improper specimen collection / handling, submission of specimen other than nasopharyngeal swab, presence of viral mutation(s) within the areas targeted by this assay, and inadequate number of viral copies (<250 copies / mL). A negative result must be combined  with clinical observations, patient history, and epidemiological information.  Fact Sheet for Patients:   RoadLapTop.co.za  Fact Sheet for Healthcare Providers: http://kim-miller.com/  This test is not yet approved or  cleared by the [place] FDA and has been authorized for detection and/or diagnosis of  SARS-CoV-2 by FDA under an Emergency Use Authorization (EUA).  This EUA will remain in effect (meaning this test can be used) for the duration of the COVID-19 declaration under Section 564(b)(1) of the Act, 21 U.S.C. section 360bbb-3(b)(1), unless the authorization is terminated or revoked sooner.  Performed at [facility], [address], [place], [place] [address]   MRSA Next Gen by PCR, Nasal     Status: None   Collection Time: 01/08/23  5:50 PM   Specimen: Nasal Mucosa; Nasal Swab  Result Value Ref Range Status   MRSA by PCR Next Gen NOT DETECTED NOT DETECTED Final    Comment: (NOTE) The GeneXpert MRSA Assay (FDA approved for NASAL specimens only), is one component of a comprehensive MRSA colonization surveillance program. It is not intended to diagnose MRSA infection nor to guide or monitor treatment for MRSA infections. Test performance is not FDA approved in patients less than 14 years old. Performed at [facility], [address] [place], [place] [address]   C Difficile Quick Screen w PCR reflex     Status: None   Collection Time: 01/08/23  6:15 PM   Specimen: Stool  Result Value Ref Range Status   C Diff antigen NEGATIVE NEGATIVE Final   C Diff toxin NEGATIVE NEGATIVE Final   C Diff interpretation No C. difficile detected.  Final    Comment: Performed at [facility], [address], [place], [place] [address]     Radiology Studies: No results found.  Scheduled Meds:  (feeding supplement) PROSource Plus  30 mL Oral BID BM   amLODipine  10 mg Oral Daily   antiseptic oral rinse  15 mL Mouth Rinse BID   ascorbic acid  500 mg Oral Daily   calcium carbonate  1 tablet Oral TID   colchicine  0.6 mg Oral Daily   docusate sodium  100 mg Oral BID   DULoxetine  60 mg Oral Daily   enoxaparin (LOVENOX) injection  40 mg Subcutaneous Q24H   feeding supplement (GLUCERNA SHAKE)  237 mL Oral TID BM   ferrous sulfate  325 mg Oral Q breakfast   folic acid  1 mg Oral Daily    multivitamin with minerals  1 tablet Oral BID WC   mouth rinse  15 mL Mouth Rinse 4 times per day   oxybutynin  15 mg Oral QHS   pantoprazole (PROTONIX) IV  40 mg Intravenous Q24H   senna  1 tablet Oral QHS   sodium bicarbonate  650 mg Oral TID   zinc sulfate  220 mg Oral Daily   Continuous Infusions:  cefTRIAXone (ROCEPHIN)  IV Stopped (01/10/23 1055)   dextrose 150 mL/hr at 01/11/23 0421   potassium chloride      LOS: 3 days   Time spent: 48 mins  Jodette Wik Laural Benes, MD How to contact the [facility] Attending or Consulting provider 7A - 7P or covering provider during after hours 7P -7A, for this patient?  Check the care team in [facility] and look for a) attending/consulting [facility] provider listed and b) the [facility] team listed Log into [email or web address] and use [facility]'s universal password to access. If you do not have the password, please contact the hospital operator. Locate  the [facility] provider you are looking for under [facility] and page to a number that you can be directly reached. If you still have difficulty reaching the provider, please page the [facility] (Director on Call) for the Hospitalists listed on amion for assistance.  01/11/2023, 12:06 PM

## 2023-01-11 NOTE — Plan of Care (Signed)
Problem: Nutritional: Goal: Nutritional status will improve Outcome: Progressing

## 2023-01-11 NOTE — [place] FL2 (Signed)
[place] MEDICAID FL2 LEVEL OF CARE FORM     IDENTIFICATION  Patient Name: Christine [name] Birthdate: 1950-08-04 Sex: female Admission Date (Current Location): 01/08/2023  [facility] and [place] Number:  [employer] and Address:  [facility],  [address] [place] [address]      Provider Number: [id number]  Attending Physician Name and Address:  Cleora Fleet, MD  Relative Name and Phone Number:       Current Level of Care: Hospital Recommended Level of Care: Skilled Nursing Facility Prior Approval Number:    Date Approved/Denied:   PASRR Number:    Discharge Plan:      Current Diagnoses: Patient Active Problem List   Diagnosis Date Noted   Hypernatremia 01/08/2023   Severe dehydration 01/08/2023   Altered mental status 01/08/2023   Failure to thrive in adult 01/08/2023   AKI (acute kidney injury) (HCC) 01/08/2023   Gallbladder dilatation 01/08/2023   SIRS (systemic inflammatory response syndrome) (HCC) 11/09/2022   Sepsis (HCC) 11/09/2022   Acute metabolic encephalopathy 11/09/2022   Renal insufficiency 11/09/2022   Hypokalemia 11/09/2022   Normocytic anemia 11/09/2022   Morbid obesity with BMI of 50.0-59.9, adult (HCC) 09/14/2022   Gastric polyp 04/08/2022   Hyperlipidemia 05/20/2021   Lumbar radiculopathy 12/24/2020   Trigger little finger of left hand 12/16/2020   Medial epicondylitis of elbow, left 12/16/2020   Memory difficulties 12/11/2020   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   Globus sensation 11/15/2019   Gastroesophageal reflux disease 11/15/2019   Throat clearing 10/05/2019   Gout 08/31/2019   Phlebitis 08/31/2019   Prediabetes 08/31/2019   OA (osteoarthritis) of knee 05/17/2019   Acute pain of right knee 05/17/2019   Abdominal pain 03/31/2019   Morbid obesity (HCC) 03/29/2018   GAD (generalized anxiety disorder) 02/27/2018   Mixed incontinence urge and stress 02/27/2018   OSA on CPAP  06/16/2017   Acute appendicitis 05/06/2016   Gastro-esophageal reflux disease with esophagitis 07/26/2011   Essential hypertension 07/26/2011   Osteoarthrosis 10/16/2009   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, including shoulder 08/22/2008   Pre-operative cardiovascular examination 08/22/2008   Anxiety 04/27/2003    Orientation RESPIRATION BLADDER Height & Weight     Self, Place  Normal Incontinent, External catheter Weight: 226 lb 10.1 oz (102.8 kg) Height:  5\' 1"  (154.9 cm)  BEHAVIORAL SYMPTOMS/MOOD NEUROLOGICAL BOWEL NUTRITION STATUS      Incontinent Diet (Carb modified)  AMBULATORY STATUS COMMUNICATION OF NEEDS Skin   Extensive Assist Verbally Other (Comment) (Coccyx mid stage 2 and buttocks right stage 2)                       Personal Care Assistance Level of Assistance  Bathing, Feeding, Dressing Bathing Assistance: Limited assistance Feeding assistance: Independent Dressing Assistance: Limited assistance     Functional Limitations Info  Sight, Hearing, Speech Sight Info: Adequate Hearing Info: Adequate Speech Info: Adequate    SPECIAL CARE FACTORS FREQUENCY  PT (By licensed PT), OT (By licensed OT)     PT Frequency: 5 times weekly OT Frequency: 5 times weekly            Contractures Contractures Info: Not present    Additional Factors Info  Code Status, Allergies Code Status Info: FULL Allergies Info: Prednisone, Tape, Sulfa Antibiotics           Current Medications (01/11/2023):  This is the current hospital active medication list Current  Facility-Administered Medications  Medication Dose Route Frequency Provider Last Rate Last Admin   (feeding supplement) PROSource Plus liquid 30 mL  30 mL Oral BID BM Johnson, Clanford L, MD   30 mL at 01/10/23 1752   acetaminophen (TYLENOL) tablet 650 mg  650 mg Oral Q6H PRN Johnson, Clanford L, MD   650 mg at 01/10/23 2236   Or   acetaminophen (TYLENOL) suppository 650 mg   650 mg Rectal Q6H PRN Johnson, Clanford L, MD       amLODipine (NORVASC) tablet 10 mg  10 mg Oral Daily Johnson, Clanford L, MD   10 mg at 01/11/23 5784   antiseptic oral rinse (BIOTENE) solution 15 mL  15 mL Mouth Rinse BID Johnson, Clanford L, MD       ascorbic acid (VITAMIN C) tablet 500 mg  500 mg Oral Daily Johnson, Clanford L, MD   500 mg at 01/11/23 6962   calcium carbonate (TUMS - dosed in mg elemental calcium) chewable tablet 200 mg of elemental calcium  1 tablet Oral TID Laural Benes, Clanford L, MD   200 mg of elemental calcium at 01/11/23 9528   cefTRIAXone (ROCEPHIN) 1 g in sodium chloride 0.9 % 100 mL IVPB  1 g Intravenous Q24H Johnson, Clanford L, MD   Stopped at 01/10/23 1055   colchicine tablet 0.6 mg  0.6 mg Oral Daily Johnson, Clanford L, MD   0.6 mg at 01/11/23 4132   dextrose 5 % solution   Intravenous Continuous Johnson, Clanford L, MD 150 mL/hr at 01/11/23 0421 Infusion Verify at 01/11/23 0421   diclofenac Sodium (VOLTAREN) 1 % topical gel 2 g  2 g Topical QID PRN Johnson, Clanford L, MD       docusate sodium (COLACE) capsule 100 mg  100 mg Oral BID Johnson, Clanford L, MD   100 mg at 01/11/23 4401   DULoxetine (CYMBALTA) DR capsule 60 mg  60 mg Oral Daily Johnson, Clanford L, MD   60 mg at 01/11/23 0821   enoxaparin (LOVENOX) injection 40 mg  40 mg Subcutaneous Q24H Johnson, Clanford L, MD   40 mg at 01/10/23 2133   feeding supplement (GLUCERNA SHAKE) (GLUCERNA SHAKE) liquid 237 mL  237 mL Oral TID BM Johnson, Clanford L, MD   237 mL at 01/10/23 2058   fentaNYL (SUBLIMAZE) injection 25 mcg  25 mcg Intravenous Q2H PRN Johnson, Clanford L, MD   25 mcg at 01/08/23 2235   ferrous sulfate tablet 325 mg  325 mg Oral Q breakfast Johnson, Clanford L, MD   325 mg at 01/11/23 0272   folic acid (FOLVITE) tablet 1 mg  1 mg Oral Daily Johnson, Clanford L, MD   1 mg at 01/11/23 0820   multivitamin with minerals tablet 1 tablet  1 tablet Oral BID WC Johnson, Clanford L, MD   1 tablet at 01/11/23  0820   ondansetron (ZOFRAN) tablet 4 mg  4 mg Oral Q6H PRN Johnson, Clanford L, MD       Or   ondansetron (ZOFRAN) injection 4 mg  4 mg Intravenous Q6H PRN Johnson, Clanford L, MD       Oral care mouth rinse  15 mL Mouth Rinse 4 times per day Laural Benes, Clanford L, MD   15 mL at 01/11/23 0819   Oral care mouth rinse  15 mL Mouth Rinse PRN Johnson, Clanford L, MD       oxybutynin (DITROPAN-XL) 24 hr tablet 15 mg  15 mg Oral QHS Johnson, Clanford L,  MD   15 mg at 01/10/23 2131   oxyCODONE (Oxy IR/ROXICODONE) immediate release tablet 5 mg  5 mg Oral Q4H PRN Johnson, Clanford L, MD       pantoprazole (PROTONIX) injection 40 mg  40 mg Intravenous Q24H Johnson, Clanford L, MD   40 mg at 01/10/23 1800   potassium chloride 10 mEq in 100 mL IVPB  10 mEq Intravenous Q1 Hr x 4 Johnson, Clanford L, MD       senna (SENOKOT) tablet 8.6 mg  1 tablet Oral QHS Johnson, Clanford L, MD   8.6 mg at 01/10/23 2132   sodium bicarbonate tablet 650 mg  650 mg Oral TID Laural Benes, Clanford L, MD   650 mg at 01/11/23 0820   traZODone (DESYREL) tablet 50 mg  50 mg Oral QHS PRN Laural Benes, Clanford L, MD   50 mg at 01/09/23 2235   zinc sulfate capsule 220 mg  220 mg Oral Daily Johnson, Clanford L, MD   220 mg at 01/11/23 1610     Discharge Medications: Please see discharge summary for a list of discharge medications.  Relevant Imaging Results:  Relevant Lab Results:   Additional Information SSN: [id number] [place]

## 2023-01-11 NOTE — TOC Progression Note (Signed)
Transition of Care [facility]) - Progression Note    Patient Details  Name: Christine [name] MRN: [id number] Date of Birth: 03-21-1951  Transition of Care [facility]) CM/SW Contact  Villa Herb, [place] Phone Number: 01/11/2023, 11:15 AM  Clinical Narrative:    CSW spoke with pts sister who states that pt has not be back to her home since being placed at [facility] rehab in July of this year. Pt goes to rehab, has a hospital admission and returns back to rehab. Pts sister states that they are interested in another facility if possible. CSW explained that referral can be sent to facilities both here and [place]. CSW explained that if they are looking for placement in [place] so pt can be closer to family they will need to work on this with the facility that pt goes to. CSW explained that due to pt being in facility for a couple months she may need to transition to long term and if this is the case they will need to apply her for [place] medicaid. Pts sister is understanding and plans to look for facilities in [place] and work with the facility that pt discharges from hospital to. CSW to send out referral for review and start auth once PT has worked with pt. TOC to follow.   Expected Discharge Plan: Skilled Nursing Facility Barriers to Discharge: Continued Medical Work up  Expected Discharge Plan and Services In-house Referral: Clinical Social Work Discharge Planning Services: CM Consult Post Acute Care Choice: Skilled Nursing Facility Living arrangements for the past 2 months: Skilled Nursing Facility                                       Social Determinants of Health (SDOH) Interventions SDOH Screenings   Food Insecurity: Patient Unable To Answer (01/08/2023)  Housing: Patient Declined (01/08/2023)  Transportation Needs: Patient Unable To Answer (01/08/2023)  Utilities: Patient Unable To Answer (01/08/2023)  Alcohol Screen: Low Risk  (07/13/2022)  Depression (PHQ2-9): Low Risk  (01/21/2022)   Financial Resource Strain: Low Risk  (12/01/2022)   [facility] from [facility]  Physical Activity: Insufficiently Active (07/13/2022)  Social Connections: Moderately Integrated (12/01/2022)   Received from [facility]  Stress: No Stress Concern Present (07/13/2022)  Tobacco Use: Medium Risk (01/08/2023)  Health Literacy: Medium Risk (12/01/2022)   Received from [facility]    Readmission Risk Interventions    01/09/2023   11:11 AM  Readmission Risk Prevention Plan  Transportation Screening Complete  HRI or Home Care Consult Complete  Social Work Consult for Recovery Care Planning/Counseling Complete  Palliative Care Screening Not Applicable  Medication Review Oceanographer) Complete

## 2023-01-11 NOTE — Progress Notes (Signed)
Patient placed on CPAP.  Auto titration between 4 and 15.

## 2023-01-12 DIAGNOSIS — N179 Acute kidney failure, unspecified: Secondary | ICD-10-CM | POA: Diagnosis not present

## 2023-01-12 DIAGNOSIS — G9341 Metabolic encephalopathy: Secondary | ICD-10-CM | POA: Diagnosis not present

## 2023-01-12 DIAGNOSIS — E870 Hyperosmolality and hypernatremia: Secondary | ICD-10-CM | POA: Diagnosis not present

## 2023-01-12 LAB — GLUCOSE, CAPILLARY
Glucose-Capillary: 71 mg/dL (ref 70–99)
Glucose-Capillary: 100 mg/dL — ABNORMAL HIGH (ref 70–99)
Glucose-Capillary: 82 mg/dL (ref 70–99)

## 2023-01-12 MED ORDER — MAGNESIUM SULFATE 2 GM/50ML IV SOLN
2.0000 g | Freq: Once | INTRAVENOUS | Status: AC
  Administered 2023-01-12: 2 g via INTRAVENOUS
  Filled 2023-01-12: qty 50

## 2023-01-12 MED ORDER — ADULT MULTIVITAMIN W/MINERALS CH
1.0000 | ORAL_TABLET | Freq: Every day | ORAL | Status: DC
  Administered 2023-01-13 – 2023-01-19 (×3): 1 via ORAL
  Filled 2023-01-12 (×7): qty 1

## 2023-01-12 MED ORDER — POTASSIUM CHLORIDE 10 MEQ/100ML IV SOLN
10.0000 meq | INTRAVENOUS | Status: AC
  Administered 2023-01-12 (×4): 10 meq via INTRAVENOUS
  Filled 2023-01-12 (×2): qty 100

## 2023-01-12 MED ORDER — SODIUM CHLORIDE 0.9 % IV SOLN
1.0000 mg | Freq: Once | INTRAVENOUS | Status: AC
  Administered 2023-01-13: 1 mg via INTRAVENOUS
  Filled 2023-01-12: qty 0.2

## 2023-01-12 MED ORDER — KCL IN DEXTROSE-NACL 40-5-0.45 MEQ/L-%-% IV SOLN: INTRAVENOUS | Status: AC

## 2023-01-12 MED ORDER — POTASSIUM CHLORIDE CRYS ER 20 MEQ PO TBCR
40.0000 meq | EXTENDED_RELEASE_TABLET | Freq: Once | ORAL | Status: AC
  Administered 2023-01-12: 40 meq via ORAL
  Filled 2023-01-12: qty 2

## 2023-01-12 NOTE — Progress Notes (Signed)
Patient rested well through the night with CPAP on. No complaints of pain, tolerated wound care well. Resting in bed at this time, bed in lowest position, bed alarm on, call bell within reach, sister remains at bedside.

## 2023-01-12 NOTE — Progress Notes (Signed)
   01/12/23 6962  Provider Notification  Provider Name/Title Dr. Arbutus Leas  Date Provider Notified 01/12/23  Time Provider Notified [phone]  Method of Notification Page  Notification Reason Critical Result  Test performed and critical result potassium  Date Critical Result Received 01/12/23  Time Critical Result Received 0704  Provider response Other (Comment) (awaiting orders)   Dr. Arbutus Leas notified and passed on to day shift nurse Nicholes Stairs

## 2023-01-12 NOTE — Plan of Care (Signed)
  Problem: Education: Goal: Ability to state signs and symptoms to report to health care provider will improve Outcome: Progressing Goal: Knowledge of the prescribed self-care regimen will improve Outcome: Progressing Goal: Knowledge of discharge needs will improve Outcome: Progressing   Problem: Activity: Goal: Ability to tolerate increased activity will improve Outcome: Progressing   Problem: Bowel/Gastric: Goal: Gastrointestinal status for postoperative course will improve Outcome: Progressing

## 2023-01-12 NOTE — Progress Notes (Signed)
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 Dossie Der.  She has been progressively declining since surgery.  She has had multiple hospitalizations and ED visits in past several months.  Recently seen at [facility] on 01/03/23 with complaints of weakness and poor oral intake.  She was hospitalized in July and August of this year at different facilities for dehydration, weakness, acute renal failure and failure to thrive.  She has been residing in a SNF.  She apparently has not been eating or drinking and refusing to take her medications.  She was sent to ER today for evaluation of altered mentation.  She has had 3 days of progressive weakness and refusing oral intake.  She complains of pain all over body which is not new per record review. Pt is a poor historian.   She was found to be severely dehydrated with hypoglycemia and hypernatremic with a sodium of 157.  She also had acute kidney injury and distended gallbladder on CT scan.  CT head with no acute findings.  Pt was started on IV hydration and admission was requested.      Assessment/Plan: Hypernatremia / Severe Dehydration  - pt presented with severe clinical findings of free water deficit  - continue D5W infusion to give free water - sodium slowing trending down   - monitor sodium levels daily  - change IVF to D51/2NS with KCl   Hypoglycemia  - from poor oral intake and recent gastric bypass  - monitor closely as these gastric bypass patients sometimes suffer from postprandial hypoglycemia - monitor CBG regularly - continue dextrose infusion    Hypokalemia - IV replacement ordered - mag 1.5 -add KCl to IVF   AKI  -baseline creatinine 0.6-1.0 -serum creatinine peaked  1.66 - prerenal from severe dehydration  - continue IV fluid hydration as ordered  - renal function slowly improving with hydration    Acute metabolic encephalopathy/Failure to Thrive - secondary to severe hypernatremia, AKI - starting to improve with proper hydration  - given gastric bypass -B12--1183 -folate 11.5 -TSH 1.725 -received 3 days of ceftriaxone -consult speech therapy--pt appears to be pocketing food vs dysphagia   Generalized Abdominal Pain with gallbladder distension/dilation - given poor oral intake we need to make sure she is not having issues with gallbladder - abdominal US shows distended gallbladder with minimal sludge - slowly advancing diet and she seems to be tolerating with no further complaint of abd pain - follow clinically>>no further abd pain   NAG Metabolic acidosis - added sodium bicarb tablets 9/16   OSA  - nightly CPAP ordered    Essential hypertension  - initially held home antihypertensives until she has been better rehydrated  - pt had been refusing all of her meds for past several days  - BPs now rebounding after hydration - restarted amlodipine 10 mg daily on 9/16     Generalized weakness  - secondary to acute metabolic derangements discussed above - PT eval requested 9/17    Morbid Obesity  - pt is postop s/p 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 Dossie Der. - her preop weight was 294.8 # and her 2nd postop visit she weighed 261.6# - current weight 226.2 #  - continue vitamin supplements  GERD - IV pantoprazole ordered for GI protection until she can take oral again   Adult Failure to Thrive - if no improvement, would recommend palliative consultation given precipitous decline after recent major surgery - pt's sister traveled by train from [place] to come here to visit and help with arrangements for post hospital care   Sacral pressure wounds - noted below, appreciate wound care nursing team  consultation       Family Communication:  no Family at bedside  Consultants:  none  Code Status:  FULL  DVT Prophylaxis:   [place] Lovenox   Procedures: As Listed in Progress Note Above  Antibiotics: None        Subjective:  Patient denies fevers, chills, headache, chest pain, dyspnea, nausea, vomiting, diarrhea, abdominal pain, dysuria, hematuria, hematochezia, and melena.  Objective: Vitals:   01/11/23 2106 01/11/23 2220 01/12/23 0456 01/12/23 0500  BP: 132/77  121/63   Pulse: 84 84 91   Resp:  18 18   Temp: 97.7 F (36.5 C)  98.1 F (36.7 C)   TempSrc: Oral     SpO2: 100% 99% 100%   Weight:    101.9 kg  Height:        Intake/Output Summary (Last 24 hours) at 01/12/2023 1720 Last data filed at 01/12/2023 1554 Gross per 24 hour  Intake 2391.84 ml  Output 900 ml  Net 1491.84 ml   Weight change: -0.9 kg Exam:  General:  Pt is alert, follows commands appropriately, not in acute distress HEENT: No icterus, No thrush, No neck mass, [place]/AT Cardiovascular: RRR, S1/S2, no rubs, no gallops Respiratory: bibasilar crackles.  No wheeze Abdomen: Soft/+BS, non tender, non distended, no guarding Extremities: No edema, No lymphangitis, No petechiae, No rashes, no synovitis   Data Reviewed: I have personally reviewed following labs and imaging studies Basic Metabolic Panel: Recent Labs  Lab 01/08/23 1200 01/09/23 0423 01/10/23 0405 01/11/23 0518 01/12/23 0444  NA 157* 150* 151* 144 139  K 3.9 3.2* 3.5 3.0* 2.7*  CL 121* 121* 122* 115* 112*  CO2 20* 17* 17* 19* 17*  GLUCOSE 97 105* 74 97 84  BUN 76* 62* 48* 37* 31*  CREATININE 1.66* 1.32* 1.24* 1.19* 1.11*  CALCIUM 9.7 9.0 8.9 8.7* 8.3*  MG  --  2.0 2.0  --  1.5*   Liver Function Tests: Recent Labs  Lab 01/08/23 1200 01/09/23 0423 01/10/23 0405  AST 39 32 27  ALT 34 27 24  ALKPHOS 287* 222* 194*  BILITOT 1.0 0.8 0.7  PROT 7.7 6.8 6.3*  ALBUMIN 3.2* 2.8* 2.8*   No results for input(s): "LIPASE",  "AMYLASE" in the last 168 hours. Recent Labs  Lab 01/08/23 1200  AMMONIA 11   Coagulation Profile: No results for input(s): "INR", "PROTIME" in the last 168 hours. CBC: Recent Labs  Lab 01/08/23 1200 01/09/23 0423 01/10/23 0549 01/11/23 0518  WBC 7.4 8.2 7.1 7.0  NEUTROABS 4.1  --   --   --   HGB 11.5* 10.2* 9.8* 10.1*  HCT 38.7 34.0* 31.8* 33.6*  MCV 88.4 87.2 88.1 88.2  PLT 273 240 212 222   Cardiac Enzymes: No results for input(s): "CKTOTAL", "CKMB", "CKMBINDEX", "TROPONINI" in the last 168 hours. BNP: Invalid input(s): "POCBNP" CBG: Recent Labs  Lab 01/11/23 1124 01/11/23 1650 01/12/23 0740 01/12/23 1152 01/12/23 1636  GLUCAP 92 115* 71 100* 82   HbA1C: No results for input(s): "HGBA1C" in the last 72 hours. Urine analysis:    Component Value Date/Time  COLORURINE YELLOW 01/09/2023 0719   APPEARANCEUR HAZY (A) 01/09/2023 0719   LABSPEC 1.013 01/09/2023 0719   PHURINE 5.0 01/09/2023 0719   GLUCOSEU NEGATIVE 01/09/2023 0719   HGBUR LARGE (A) 01/09/2023 0719   BILIRUBINUR NEGATIVE 01/09/2023 0719   KETONESUR NEGATIVE 01/09/2023 0719   PROTEINUR 30 (A) 01/09/2023 0719   NITRITE POSITIVE (A) 01/09/2023 0719   LEUKOCYTESUR LARGE (A) 01/09/2023 0719   Sepsis Labs: @LABRCNTIP (procalcitonin:4,lacticidven:4) ) Recent Results (from the past 240 hour(s))  SARS Coronavirus 2 by RT PCR (hospital order, performed in [facility] hospital lab) *cepheid single result test* Anterior Nasal Swab     Status: None   Collection Time: 01/08/23 12:18 PM   Specimen: Anterior Nasal Swab  Result Value Ref Range Status   SARS Coronavirus 2 by RT PCR NEGATIVE NEGATIVE Final    Comment: (NOTE) SARS-CoV-2 target nucleic acids are NOT DETECTED.  The SARS-CoV-2 RNA is generally detectable in upper and lower respiratory specimens during the acute phase of infection. The lowest concentration of SARS-CoV-2 viral copies this assay can detect is 250 copies / mL. A negative result  does not preclude SARS-CoV-2 infection and should not be used as the sole basis for treatment or other patient management decisions.  A negative result may occur with improper specimen collection / handling, submission of specimen other than nasopharyngeal swab, presence of viral mutation(s) within the areas targeted by this assay, and inadequate number of viral copies (<250 copies / mL). A negative result must be combined with clinical observations, patient history, and epidemiological information.  Fact Sheet for Patients:   RoadLapTop.co.za  Fact Sheet for Healthcare Providers: http://kim-miller.com/  This test is not yet approved or  cleared by the [place] FDA and has been authorized for detection and/or diagnosis of SARS-CoV-2 by FDA under an Emergency Use Authorization (EUA).  This EUA will remain in effect (meaning this test can be used) for the duration of the COVID-19 declaration under Section 564(b)(1) of the Act, 21 U.S.C. section 360bbb-3(b)(1), unless the authorization is terminated or revoked sooner.  Performed at [facility], [address], [place], [place] [address]   MRSA Next Gen by PCR, Nasal     Status: None   Collection Time: 01/08/23  5:50 PM   Specimen: Nasal Mucosa; Nasal Swab  Result Value Ref Range Status   MRSA by PCR Next Gen NOT DETECTED NOT DETECTED Final    Comment: (NOTE) The GeneXpert MRSA Assay (FDA approved for NASAL specimens only), is one component of a comprehensive MRSA colonization surveillance program. It is not intended to diagnose MRSA infection nor to guide or monitor treatment for MRSA infections. Test performance is not FDA approved in patients less than 70 years old. Performed at [facility], [address], [place], [place] [address]   Gastrointestinal Panel by PCR , Stool     Status: None   Collection Time: 01/08/23  6:11 PM   Specimen: Stool  Result Value Ref Range Status    Campylobacter species NOT DETECTED NOT DETECTED Final   Plesimonas shigelloides NOT DETECTED NOT DETECTED Final   Salmonella species NOT DETECTED NOT DETECTED Final   Yersinia enterocolitica NOT DETECTED NOT DETECTED Final   Vibrio species NOT DETECTED NOT DETECTED Final   Vibrio cholerae NOT DETECTED NOT DETECTED Final   Enteroaggregative E coli (EAEC) NOT DETECTED NOT DETECTED Final   Enteropathogenic E coli (EPEC) NOT DETECTED NOT DETECTED Final   Enterotoxigenic E coli (ETEC) NOT DETECTED NOT DETECTED Final   Shiga  like toxin producing E coli (STEC) NOT DETECTED NOT DETECTED Final   Shigella/Enteroinvasive E coli (EIEC) NOT DETECTED NOT DETECTED Final   Cryptosporidium NOT DETECTED NOT DETECTED Final   Cyclospora cayetanensis NOT DETECTED NOT DETECTED Final   Entamoeba histolytica NOT DETECTED NOT DETECTED Final   Giardia lamblia NOT DETECTED NOT DETECTED Final   Adenovirus F40/41 NOT DETECTED NOT DETECTED Final   Astrovirus NOT DETECTED NOT DETECTED Final   Norovirus GI/GII NOT DETECTED NOT DETECTED Final   Rotavirus A NOT DETECTED NOT DETECTED Final   Sapovirus (I, II, IV, and V) NOT DETECTED NOT DETECTED Final    Comment: Performed at [facility], [address] [place], [place] [address]  C Difficile Quick Screen w PCR reflex     Status: None   Collection Time: 01/08/23  6:15 PM   Specimen: Stool  Result Value Ref Range Status   C Diff antigen NEGATIVE NEGATIVE Final   C Diff toxin NEGATIVE NEGATIVE Final   C Diff interpretation No C. difficile detected.  Final    Comment: Performed at [facility], [address], [place], [place] [address]     Scheduled Meds:  (feeding supplement) PROSource Plus  30 mL Oral BID BM   amLODipine  10 mg Oral Daily   antiseptic oral rinse  15 mL Mouth Rinse BID   colchicine  0.6 mg Oral Daily   DULoxetine  60 mg Oral Daily   enoxaparin (LOVENOX) injection  40 mg Subcutaneous Q24H   feeding supplement (GLUCERNA SHAKE)  237 mL  Oral TID BM   ferrous sulfate  325 mg Oral Q breakfast   [START ON 01/13/2023] multivitamin with minerals  1 tablet Oral Daily   mouth rinse  15 mL Mouth Rinse 4 times per day   oxybutynin  15 mg Oral QHS   pantoprazole (PROTONIX) IV  40 mg Intravenous Q24H   sodium bicarbonate  650 mg Oral TID   Continuous Infusions:  dextrose 75 mL/hr at 01/12/23 1554   [START ON 01/13/2023] folic acid 1 mg in sodium chloride 0.9 % 50 mL IVPB      Procedures/Studies: US Abdomen Complete  Result Date: 01/09/2023 CLINICAL DATA:  [id number] Gallbladder dilatation [id number] Generalized abdominal pain 148134 EXAM: ABDOMEN ULTRASOUND COMPLETE COMPARISON:  None Available. FINDINGS: Gallbladder: Gallbladder appears distended. Echogenic structure in the gallbladder is consistent with sludge. No shadowing gallstones or wall thickening visualized. No sonographic Murphy sign noted by sonographer. Common bile duct: Diameter: 0.4cm. Liver: No focal lesion identified. Hyperechoic consistent with fatty infiltration. IVC: No abnormality visualized. Pancreas: Obscured by bowel gas. Spleen: Size and appearance within normal limits. Right Kidney: Length: 10.6 cm. Echogenicity increased consistent with chronic medical renal disease. No mass or hydronephrosis visualized. No shadowing stones. Left Kidney: Length: 9.6cm. Echogenicity increased consistent with chronic medical renal disease. Cyst measures 5.5 cm. No follow up recommended based on the ultrasound appearance of the cyst. No solid mass or hydronephrosis visualized. No shadowing stones. Abdominal aorta: Obscured by bowel gas. IMPRESSION: 1. Hepatic fatty infiltration. 2. Gallbladder distended. 3. Minimal gallbladder sludge. 4. Echogenic kidneys consistent with chronic medical renal disease. 5. Left kidney cyst. 6. There was limited visualization, as described. Electronically Signed   By: Layla Maw M.D.   On: 01/09/2023 11:17   CT ABDOMEN PELVIS WO CONTRAST  Result Date:  01/08/2023 CLINICAL DATA:  Abdominal pain. EXAM: CT ABDOMEN AND PELVIS WITHOUT CONTRAST TECHNIQUE: Multidetector CT imaging of the abdomen and pelvis was performed following the  standard protocol without IV contrast. RADIATION DOSE REDUCTION: This exam was performed according to the departmental dose-optimization program which includes automated exposure control, adjustment of the mA and/or kV according to patient size and/or use of iterative reconstruction technique. COMPARISON:  CT 10/15/2022 and older FINDINGS: Evaluation significant limited by motion despite multiple attempts. As per the CT technologist, the patient had difficulty with the breathing instructions. Lower chest: Lung bases are limited by motion. There is some basilar atelectasis. Coronary artery calcifications are seen. Hepatobiliary: Grossly preserved parenchyma. Gallbladder is dilated. Pancreas: No obvious pancreatic mass. Spleen: Spleen is nonenlarged. Adrenals/Urinary Tract: Nodularity of the adrenal glands, poorly defined with the motion. No renal collecting system dilatation. No obvious obstructing stones. There is some cystic lesions in the left kidney including a larger lower pole focus with some septations and calcifications. Again evaluation is limited but appears similar to previous. Mildly distended urinary bladder. Stomach/Bowel: Surgical changes gastric bypass. The stomach, small and large bowel are nondilated. There are some scattered stool. Details of the bowel are limited. Vascular/Lymphatic: Scattered vascular calcifications. No obvious dilatation of the abdominal aorta. IVC is nondilated. No obvious lymph node enlargement. Reproductive: Status post hysterectomy. No adnexal masses. Other: Markedly limited by motion. No obvious fluid collections. No large amounts of free air. Musculoskeletal: Scattered degenerative changes of the spine and pelvis. Overall moderate to severe. IMPRESSION: Distended gallbladder. If there is concern of  gallbladder pathology ultrasound may be useful as the next step in the workup. Grossly stable left-sided renal cystic foci. Surgical changes of previous gastric bypass Markedly limited examination due to the level of motion. No obvious obstruction, free air or free fluid. In addition, a repeat study could be considered when the patient is more clinically able. Electronically Signed   By: Karen Kays M.D.   On: 01/08/2023 13:41   CT Head Wo Contrast  Result Date: 01/08/2023 CLINICAL DATA:  Altered mental status, weakness for 3 days, confusion EXAM: CT HEAD WITHOUT CONTRAST TECHNIQUE: Contiguous axial images were obtained from the base of the skull through the vertex without intravenous contrast. RADIATION DOSE REDUCTION: This exam was performed according to the departmental dose-optimization program which includes automated exposure control, adjustment of the mA and/or kV according to patient size and/or use of iterative reconstruction technique. COMPARISON:  01/03/2023 FINDINGS: Brain: No evidence of acute infarction, hemorrhage, hydrocephalus, extra-axial collection or mass lesion/mass effect. Mild periventricular white matter hypodensity. Vascular: No hyperdense vessel or unexpected calcification. Skull: Normal. Negative for fracture or focal lesion. Sinuses/Orbits: No acute finding. Other: None. IMPRESSION: No acute intracranial pathology.  Small-vessel white matter disease. Electronically Signed   By: Jearld Lesch M.D.   On: 01/08/2023 13:38   DG Chest Port 1 View  Result Date: 01/08/2023 CLINICAL DATA:  Altered mental status. EXAM: PORTABLE CHEST 1 VIEW COMPARISON:  01/03/2023 FINDINGS: Low lung volumes. Similar streaky density in the retrocardiac medial left base, likely vascular anatomy. No focal consolidation, pulmonary edema, or pleural effusion. Cardiopericardial silhouette is at upper limits of normal for size. No acute bony abnormality. Telemetry leads overlie the chest. IMPRESSION: No active  disease. Electronically Signed   By: Kennith Center M.D.   On: 01/08/2023 12:23    Catarina Hartshorn, DO  [facility]  If 7PM-7AM, please contact night-coverage [email or web address] Password [email or web address] 01/12/2023, 5:20 PM   LOS: 4 days

## 2023-01-12 NOTE — TOC Progression Note (Signed)
Transition of Care [facility]) - Progression Note    Patient Details  Name: Christine [name] MRN: [id number] Date of Birth: 1951/04/12  [facility] CM/SW Contact  Villa Herb, [place] Phone Number: 01/12/2023, 11:40 AM  Clinical Narrative:    CSW spoke with pts sister on phone who is at bedside with pt about SNF bed offers. Pt and sister accept bed offer at [facility]. TOC to start insurance auth at this time. TOC to follow.  Expected Discharge Plan: Skilled Nursing Facility Barriers to Discharge: Continued Medical Work up  Expected Discharge Plan and Services In-house Referral: Clinical Social Work Discharge Planning Services: CM Consult Post Acute Care Choice: Skilled Nursing Facility Living arrangements for the past 2 months: Skilled Nursing Facility                                       Social Determinants of Health (SDOH) Interventions SDOH Screenings   Food Insecurity: Patient Unable To Answer (01/08/2023)  Housing: Patient Declined (01/08/2023)  Transportation Needs: Patient Unable To Answer (01/08/2023)  Utilities: Patient Unable To Answer (01/08/2023)  Alcohol Screen: Low Risk  (07/13/2022)  Depression (PHQ2-9): Low Risk  (01/21/2022)  Financial Resource Strain: Low Risk  (12/01/2022)   Received from [facility]  Physical Activity: Insufficiently Active (07/13/2022)  Social Connections: Moderately Integrated (12/01/2022)   Received from [facility]  Stress: No Stress Concern Present (07/13/2022)  Tobacco Use: Medium Risk (01/08/2023)  Health Literacy: Medium Risk (12/01/2022)   Received from [facility]    Readmission Risk Interventions    01/09/2023   11:11 AM  Readmission Risk Prevention Plan  Transportation Screening Complete  HRI or Home Care Consult Complete  Social Work Consult for Recovery Care Planning/Counseling Complete  Palliative Care Screening Not Applicable  Medication Review Oceanographer) Complete

## 2023-01-12 NOTE — Plan of Care (Signed)
  Problem: Education: Goal: Ability to state signs and symptoms to report to health care provider will improve Outcome: Progressing Goal: Knowledge of the prescribed self-care regimen will improve Outcome: Progressing Goal: Knowledge of discharge needs will improve Outcome: Progressing

## 2023-01-12 NOTE — Progress Notes (Signed)
Wound care completed to all wounds, patient tolerated well.

## 2023-01-13 DIAGNOSIS — E870 Hyperosmolality and hypernatremia: Secondary | ICD-10-CM | POA: Diagnosis not present

## 2023-01-13 DIAGNOSIS — G9341 Metabolic encephalopathy: Secondary | ICD-10-CM | POA: Diagnosis not present

## 2023-01-13 DIAGNOSIS — R627 Adult failure to thrive: Secondary | ICD-10-CM | POA: Diagnosis not present

## 2023-01-13 DIAGNOSIS — N179 Acute kidney failure, unspecified: Secondary | ICD-10-CM | POA: Diagnosis not present

## 2023-01-13 LAB — MAGNESIUM: Magnesium: 2 mg/dL (ref 1.7–2.4)

## 2023-01-13 LAB — FOLATE: Folate: 8.6 ng/mL (ref >5.9)

## 2023-01-13 LAB — BASIC METABOLIC PANEL
Anion gap: 7 (ref 5–15)
BUN: 23 mg/dL (ref 8–23)
CO2: 17 mmol/L — ABNORMAL LOW (ref 22–32)
Calcium: 8.4 mg/dL — ABNORMAL LOW (ref 8.9–10.3)
Chloride: 114 mmol/L — ABNORMAL HIGH (ref 98–111)
Creatinine, Ser: 0.98 mg/dL (ref 0.44–1.00)
GFR, Estimated: >60 mL/min (ref >60)
Glucose, Bld: 87 mg/dL (ref 70–99)
Potassium: 3.6 mmol/L (ref 3.5–5.1)
Sodium: 138 mmol/L (ref 135–145)

## 2023-01-13 LAB — CBC
HCT: 30.6 % — ABNORMAL LOW (ref 36.0–46.0)
Hemoglobin: 9.6 g/dL — ABNORMAL LOW (ref 12.0–15.0)
MCH: 26.7 pg (ref 26.0–34.0)
MCHC: 31.4 g/dL (ref 30.0–36.0)
MCV: 85 fL (ref 80.0–100.0)
Platelets: 214 10*3/uL (ref 150–400)
RBC: 3.6 MIL/uL — ABNORMAL LOW (ref 3.87–5.11)
RDW: 22 % — ABNORMAL HIGH (ref 11.5–15.5)
WBC: 5.3 10*3/uL (ref 4.0–10.5)
nRBC: 0 % (ref 0.0–0.2)

## 2023-01-13 LAB — FERRITIN: Ferritin: 277 ng/mL (ref 11–307)

## 2023-01-13 LAB — GLUCOSE, CAPILLARY
Glucose-Capillary: 77 mg/dL (ref 70–99)
Glucose-Capillary: 91 mg/dL (ref 70–99)
Glucose-Capillary: 75 mg/dL (ref 70–99)

## 2023-01-13 LAB — AMMONIA: Ammonia: 18 umol/L (ref 9–35)

## 2023-01-13 LAB — IRON AND TIBC
Iron: 47 ug/dL (ref 28–170)
Saturation Ratios: 27 % (ref 10.4–31.8)
TIBC: 176 ug/dL — ABNORMAL LOW (ref 250–450)
UIBC: 129 ug/dL

## 2023-01-13 MED ORDER — PANTOPRAZOLE SODIUM 40 MG PO TBEC
40.0000 mg | DELAYED_RELEASE_TABLET | Freq: Every day | ORAL | Status: DC
  Administered 2023-01-14: 40 mg via ORAL
  Filled 2023-01-13 (×4): qty 1

## 2023-01-13 MED ORDER — ORAL CARE MOUTH RINSE: 15.0000 mL | OROMUCOSAL | Status: DC | PRN

## 2023-01-13 MED ORDER — POTASSIUM CHLORIDE 10 MEQ/100ML IV SOLN
10.0000 meq | INTRAVENOUS | Status: AC
  Administered 2023-01-13 (×2): 10 meq via INTRAVENOUS
  Filled 2023-01-13 (×2): qty 100

## 2023-01-13 NOTE — TOC Progression Note (Signed)
Transition of Care [facility]) - Progression Note    Patient Details  Name: Christine [name] MRN: [id number] Date of Birth: 13-Jun-1950  Transition [facility] Care [facility]) CM/SW Contact  Villa Herb, [place] Phone Number: 01/13/2023, 12:04 PM  Clinical Narrative:    CSW spoke to [place] with central intake for [employer] who states that Berkley Harvey is still pending at this time. CSW to add avoidable day. TOC to follow.   Expected Discharge Plan: Skilled Nursing Facility Barriers to Discharge: Continued Medical Work up  Expected Discharge Plan and Services In-house Referral: Clinical Social Work Discharge Planning Services: CM Consult Post Acute Care Choice: Skilled Nursing Facility Living arrangements for the past 2 months: Skilled Nursing Facility                                       Social Determinants of Health (SDOH) Interventions SDOH Screenings   Food Insecurity: Patient Unable To Answer (01/08/2023)  Housing: Patient Declined (01/08/2023)  Transportation Needs: Patient Unable To Answer (01/08/2023)  Utilities: Patient Unable To Answer (01/08/2023)  Alcohol Screen: Low Risk  (07/13/2022)  Depression (PHQ2-9): Low Risk  (01/21/2022)  Financial Resource Strain: Low Risk  (12/01/2022)   Received from [facility]  Physical Activity: Insufficiently Active (07/13/2022)  Social Connections: Moderately Integrated (12/01/2022)   Received from [facility]  Stress: No Stress Concern Present (07/13/2022)  Tobacco Use: Medium Risk (01/08/2023)  Health Literacy: Medium Risk (12/01/2022)   Received from [facility]    Readmission Risk Interventions    01/09/2023   11:11 AM  Readmission Risk Prevention Plan  Transportation Screening Complete  HRI or Home Care Consult Complete  Social Work Consult for Recovery Care Planning/Counseling Complete  Palliative Care Screening Not Applicable  Medication Review Oceanographer) Complete

## 2023-01-13 NOTE — Progress Notes (Signed)
Went in to do assessment and morning medications, pt put pills in her mouth but when I walked out of the room she spit them out. Pills were found in the pill cup on her bedside table. Pt has been noted by this nurse and staff holding food in her mouth. This nurse has encouraged eating and drinking with no success. MD is aware. Will continue to monitor.

## 2023-01-13 NOTE — Plan of Care (Signed)
  Problem: Education: Goal: Knowledge of discharge needs will improve Outcome: Progressing

## 2023-01-13 NOTE — Progress Notes (Signed)
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 Dossie Der.  She has been progressively declining since surgery.  She has had multiple hospitalizations and ED visits in past several months.  Recently seen at [facility] on 01/03/23 with complaints of weakness and poor oral intake.  She was hospitalized in July and August of this year at different facilities for dehydration, weakness, acute renal failure and failure to thrive.  She has been residing in a SNF.  She apparently has not been eating or drinking and refusing to take her medications.  She was sent to ER today for evaluation of altered mentation.  She has had 3 days of progressive weakness and refusing oral intake.  She complains of pain all over body which is not new per record review. Pt is a poor historian.   She was found to be severely dehydrated with hypoglycemia and hypernatremic with a sodium of 157.  She also had acute kidney injury and distended gallbladder on CT scan.  CT head with no acute findings.  Pt was started on IV hydration and admission was requested.      Assessment/Plan: Hypernatremia / Severe Dehydration  - pt presented with severe clinical findings of free water deficit  - continue D5W infusion to give free water - monitor sodium levels daily >>improved - change IVF to D51/2NS with KCl   Hypoglycemia  - from poor oral intake and recent gastric bypass  - monitor CBG regularly - continue dextrose infusion >>rate decreased -pt encouraged innumerable occasions to take po   Hypokalemia - IV replacement ordered - mag 1.5 -add KCl to IVF   AKI  -baseline creatinine 0.6-1.0 -serum creatinine peaked 1.66 - prerenal from severe dehydration  - continue IV fluid  hydration as ordered  - renal function improved with hydration    Acute metabolic encephalopathy/Failure to Thrive - secondary to severe hypernatremia, AKI - mental status now back to baseline -B12--1183 -folate 11.5 -TSH 1.725 -received 3 days of ceftriaxone -consult speech therapy--no evidence of oropharygeal dysphagia   Generalized Abdominal Pain with gallbladder distension/dilation - given poor oral intake we need to make sure she is not having issues with gallbladder - abdominal US shows distended gallbladder with minimal sludge - slowly advancing diet and she seems to be tolerating with no further complaint of abd pain - follow clinically>>no further abd pain   NAG Metabolic acidosis - added sodium bicarb tablets 9/16   OSA  - nightly CPAP ordered    Essential hypertension  - initially held home antihypertensives until she has been better rehydrated  - pt had been refusing all of her meds for past several days  - BPs now rebounding after hydration - restarted amlodipine 10 mg daily on 9/16     Generalized weakness  - secondary to acute metabolic derangements discussed above - PT eval requested 9/17    Morbid Obesity  - pt is postop s/p 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 Dossie Der. - her preop weight was 294.8 # and her 2nd postop visit she weighed 261.6# - current weight 226.2 #  - continue vitamin supplements    GERD - IV pantoprazole ordered for GI protection until she can take oral again   Adult Failure to Thrive -  if no improvement, would recommend palliative consultation given precipitous decline after recent major surgery - pt's sister traveled by train from [place] to come here to visit and help with arrangements for post hospital care   Sacral pressure wounds - noted below, appreciate wound care nursing team consultation             Family Communication: sister updated at bedside 9/19   Consultants:  none   Code Status:   FULL   DVT Prophylaxis:   [place] Lovenox     Procedures: As Listed in Progress Note Above   Antibiotics: None              Subjective: Patient denies fevers, chills, headache, chest pain, dyspnea, nausea, vomiting, diarrhea, abdominal pain, dysuria, hematuria, hematochezia, and melena.   Objective: Vitals:   01/12/23 2256 01/13/23 0500 01/13/23 0627 01/13/23 1400  BP:   (!) 146/88 135/70  Pulse: 91  87 95  Resp: 18  20 18   Temp:   97.8 F (36.6 C) 98 F (36.7 C)  TempSrc:   Oral   SpO2: 98%  100% 100%  Weight:  104.1 kg    Height:        Intake/Output Summary (Last 24 hours) at 01/13/2023 1842 Last data filed at 01/13/2023 1537 Gross per 24 hour  Intake 1872.51 ml  Output 500 ml  Net 1372.51 ml   Weight change: 2.2 kg Exam:  General:  Pt is alert, follows commands appropriately, not in acute distress HEENT: No icterus, No thrush, No neck mass, [place]/AT Cardiovascular: RRR, S1/S2, no rubs, no gallops Respiratory: CTA bilaterally, no wheezing, no crackles, no rhonchi Abdomen: Soft/+BS, non tender, non distended, no guarding Extremities: No edema, No lymphangitis, No petechiae, No rashes, no synovitis   Data Reviewed: I have personally reviewed following labs and imaging studies Basic Metabolic Panel: Recent Labs  Lab 01/09/23 0423 01/10/23 0405 01/11/23 0518 01/12/23 0444 01/13/23 0453  NA 150* 151* 144 139 138  K 3.2* 3.5 3.0* 2.7* 3.6  CL 121* 122* 115* 112* 114*  CO2 17* 17* 19* 17* 17*  GLUCOSE 105* 74 97 84 87  BUN 62* 48* 37* 31* 23  CREATININE 1.32* 1.24* 1.19* 1.11* 0.98  CALCIUM 9.0 8.9 8.7* 8.3* 8.4*  MG 2.0 2.0  --  1.5* 2.0   Liver Function Tests: Recent Labs  Lab 01/08/23 1200 01/09/23 0423 01/10/23 0405  AST 39 32 27  ALT 34 27 24  ALKPHOS 287* 222* 194*  BILITOT 1.0 0.8 0.7  PROT 7.7 6.8 6.3*  ALBUMIN 3.2* 2.8* 2.8*   No results for input(s): "LIPASE", "AMYLASE" in the last 168 hours. Recent Labs  Lab 01/08/23 1200  01/13/23 0453  AMMONIA 11 18   Coagulation Profile: No results for input(s): "INR", "PROTIME" in the last 168 hours. CBC: Recent Labs  Lab 01/08/23 1200 01/09/23 0423 01/10/23 0549 01/11/23 0518 01/13/23 0453  WBC 7.4 8.2 7.1 7.0 5.3  NEUTROABS 4.1  --   --   --   --   HGB 11.5* 10.2* 9.8* 10.1* 9.6*  HCT 38.7 34.0* 31.8* 33.6* 30.6*  MCV 88.4 87.2 88.1 88.2 85.0  PLT 273 240 212 222 214   Cardiac Enzymes: No results for input(s): "CKTOTAL", "CKMB", "CKMBINDEX", "TROPONINI" in the last 168 hours. BNP: Invalid input(s): "POCBNP" CBG: Recent Labs  Lab 01/12/23 1152 01/12/23 1636 01/13/23 0746 01/13/23 1129 01/13/23 1629  GLUCAP 100* 82 77 91 75   HbA1C: No results for input(s): "HGBA1C" in  the last 72 hours. Urine analysis:    Component Value Date/Time   COLORURINE YELLOW 01/09/2023 0719   APPEARANCEUR HAZY (A) 01/09/2023 0719   LABSPEC 1.013 01/09/2023 0719   PHURINE 5.0 01/09/2023 0719   GLUCOSEU NEGATIVE 01/09/2023 0719   HGBUR LARGE (A) 01/09/2023 0719   BILIRUBINUR NEGATIVE 01/09/2023 0719   KETONESUR NEGATIVE 01/09/2023 0719   PROTEINUR 30 (A) 01/09/2023 0719   NITRITE POSITIVE (A) 01/09/2023 0719   LEUKOCYTESUR LARGE (A) 01/09/2023 0719   Sepsis Labs: @LABRCNTIP (procalcitonin:4,lacticidven:4) ) Recent Results (from the past 240 hour(s))  SARS Coronavirus 2 by RT PCR (hospital order, performed in [facility] hospital lab) *cepheid single result test* Anterior Nasal Swab     Status: None   Collection Time: 01/08/23 12:18 PM   Specimen: Anterior Nasal Swab  Result Value Ref Range Status   SARS Coronavirus 2 by RT PCR NEGATIVE NEGATIVE Final    Comment: (NOTE) SARS-CoV-2 target nucleic acids are NOT DETECTED.  The SARS-CoV-2 RNA is generally detectable in upper and lower respiratory specimens during the acute phase of infection. The lowest concentration of SARS-CoV-2 viral copies this assay can detect is 250 copies / mL. A negative result does not  preclude SARS-CoV-2 infection and should not be used as the sole basis for treatment or other patient management decisions.  A negative result may occur with improper specimen collection / handling, submission of specimen other than nasopharyngeal swab, presence of viral mutation(s) within the areas targeted by this assay, and inadequate number of viral copies (<250 copies / mL). A negative result must be combined with clinical observations, patient history, and epidemiological information.  Fact Sheet for Patients:   RoadLapTop.co.za  Fact Sheet for Healthcare Providers: http://kim-miller.com/  This test is not yet approved or  cleared by the [place] FDA and has been authorized for detection and/or diagnosis of SARS-CoV-2 by FDA under an Emergency Use Authorization (EUA).  This EUA will remain in effect (meaning this test can be used) for the duration of the COVID-19 declaration under Section 564(b)(1) of the Act, 21 U.S.C. section 360bbb-3(b)(1), unless the authorization is terminated or revoked sooner.  Performed at [facility], [address] [place], [place] [address]   MRSA Next Gen by PCR, Nasal     Status: None   Collection Time: 01/08/23  5:50 PM   Specimen: Nasal Mucosa; Nasal Swab  Result Value Ref Range Status   MRSA by PCR Next Gen NOT DETECTED NOT DETECTED Final    Comment: (NOTE) The GeneXpert MRSA Assay (FDA approved for NASAL specimens only), is one component of a comprehensive MRSA colonization surveillance program. It is not intended to diagnose MRSA infection nor to guide or monitor treatment for MRSA infections. Test performance is not FDA approved in patients less than 2 years old. Performed at [facility], [address], [place], [place] [address]   Gastrointestinal Panel by PCR , Stool     Status: None   Collection Time: 01/08/23  6:11 PM   Specimen: Stool  Result Value Ref Range Status    Campylobacter species NOT DETECTED NOT DETECTED Final   Plesimonas shigelloides NOT DETECTED NOT DETECTED Final   Salmonella species NOT DETECTED NOT DETECTED Final   Yersinia enterocolitica NOT DETECTED NOT DETECTED Final   Vibrio species NOT DETECTED NOT DETECTED Final   Vibrio cholerae NOT DETECTED NOT DETECTED Final   Enteroaggregative E coli (EAEC) NOT DETECTED NOT DETECTED Final   Enteropathogenic E coli (EPEC) NOT DETECTED NOT DETECTED Final  Enterotoxigenic E coli (ETEC) NOT DETECTED NOT DETECTED Final   Shiga like toxin producing E coli (STEC) NOT DETECTED NOT DETECTED Final   Shigella/Enteroinvasive E coli (EIEC) NOT DETECTED NOT DETECTED Final   Cryptosporidium NOT DETECTED NOT DETECTED Final   Cyclospora cayetanensis NOT DETECTED NOT DETECTED Final   Entamoeba histolytica NOT DETECTED NOT DETECTED Final   Giardia lamblia NOT DETECTED NOT DETECTED Final   Adenovirus F40/41 NOT DETECTED NOT DETECTED Final   Astrovirus NOT DETECTED NOT DETECTED Final   Norovirus GI/GII NOT DETECTED NOT DETECTED Final   Rotavirus A NOT DETECTED NOT DETECTED Final   Sapovirus (I, II, IV, and V) NOT DETECTED NOT DETECTED Final    Comment: Performed at [facility], [address] [place], [place] [address]  C Difficile Quick Screen w PCR reflex     Status: None   Collection Time: 01/08/23  6:15 PM   Specimen: Stool  Result Value Ref Range Status   C Diff antigen NEGATIVE NEGATIVE Final   C Diff toxin NEGATIVE NEGATIVE Final   C Diff interpretation No C. difficile detected.  Final    Comment: Performed at [facility], [address] [place], [place] [address]     Scheduled Meds:  (feeding supplement) PROSource Plus  30 mL Oral BID BM   amLODipine  10 mg Oral Daily   antiseptic oral rinse  15 mL Mouth Rinse BID   colchicine  0.6 mg Oral Daily   DULoxetine  60 mg Oral Daily   enoxaparin (LOVENOX) injection  40 mg Subcutaneous Q24H   feeding supplement (GLUCERNA SHAKE)  237 mL  Oral TID BM   ferrous sulfate  325 mg Oral Q breakfast   multivitamin with minerals  1 tablet Oral Daily   oxybutynin  15 mg Oral QHS   pantoprazole  40 mg Oral Daily   sodium bicarbonate  650 mg Oral TID   Continuous Infusions:  dextrose 5 % and 0.45 % NaCl with KCl 40 mEq/L 50 mL/hr at 01/13/23 1405    Procedures/Studies: US Abdomen Complete  Result Date: 01/09/2023 CLINICAL DATA:  [id number] Gallbladder dilatation [id number] Generalized abdominal pain 148134 EXAM: ABDOMEN ULTRASOUND COMPLETE COMPARISON:  None Available. FINDINGS: Gallbladder: Gallbladder appears distended. Echogenic structure in the gallbladder is consistent with sludge. No shadowing gallstones or wall thickening visualized. No sonographic Murphy sign noted by sonographer. Common bile duct: Diameter: 0.4cm. Liver: No focal lesion identified. Hyperechoic consistent with fatty infiltration. IVC: No abnormality visualized. Pancreas: Obscured by bowel gas. Spleen: Size and appearance within normal limits. Right Kidney: Length: 10.6 cm. Echogenicity increased consistent with chronic medical renal disease. No mass or hydronephrosis visualized. No shadowing stones. Left Kidney: Length: 9.6cm. Echogenicity increased consistent with chronic medical renal disease. Cyst measures 5.5 cm. No follow up recommended based on the ultrasound appearance of the cyst. No solid mass or hydronephrosis visualized. No shadowing stones. Abdominal aorta: Obscured by bowel gas. IMPRESSION: 1. Hepatic fatty infiltration. 2. Gallbladder distended. 3. Minimal gallbladder sludge. 4. Echogenic kidneys consistent with chronic medical renal disease. 5. Left kidney cyst. 6. There was limited visualization, as described. Electronically Signed   By: Layla Maw M.D.   On: 01/09/2023 11:17   CT ABDOMEN PELVIS WO CONTRAST  Result Date: 01/08/2023 CLINICAL DATA:  Abdominal pain. EXAM: CT ABDOMEN AND PELVIS WITHOUT CONTRAST TECHNIQUE: Multidetector CT imaging of the  abdomen and pelvis was performed following the standard protocol without IV contrast. RADIATION DOSE REDUCTION: This exam was performed according to the  departmental dose-optimization program which includes automated exposure control, adjustment of the mA and/or kV according to patient size and/or use of iterative reconstruction technique. COMPARISON:  CT 10/15/2022 and older FINDINGS: Evaluation significant limited by motion despite multiple attempts. As per the CT technologist, the patient had difficulty with the breathing instructions. Lower chest: Lung bases are limited by motion. There is some basilar atelectasis. Coronary artery calcifications are seen. Hepatobiliary: Grossly preserved parenchyma. Gallbladder is dilated. Pancreas: No obvious pancreatic mass. Spleen: Spleen is nonenlarged. Adrenals/Urinary Tract: Nodularity of the adrenal glands, poorly defined with the motion. No renal collecting system dilatation. No obvious obstructing stones. There is some cystic lesions in the left kidney including a larger lower pole focus with some septations and calcifications. Again evaluation is limited but appears similar to previous. Mildly distended urinary bladder. Stomach/Bowel: Surgical changes gastric bypass. The stomach, small and large bowel are nondilated. There are some scattered stool. Details of the bowel are limited. Vascular/Lymphatic: Scattered vascular calcifications. No obvious dilatation of the abdominal aorta. IVC is nondilated. No obvious lymph node enlargement. Reproductive: Status post hysterectomy. No adnexal masses. Other: Markedly limited by motion. No obvious fluid collections. No large amounts of free air. Musculoskeletal: Scattered degenerative changes of the spine and pelvis. Overall moderate to severe. IMPRESSION: Distended gallbladder. If there is concern of gallbladder pathology ultrasound may be useful as the next step in the workup. Grossly stable left-sided renal cystic foci.  Surgical changes of previous gastric bypass Markedly limited examination due to the level of motion. No obvious obstruction, free air or free fluid. In addition, a repeat study could be considered when the patient is more clinically able. Electronically Signed   By: Karen Kays M.D.   On: 01/08/2023 13:41   CT Head Wo Contrast  Result Date: 01/08/2023 CLINICAL DATA:  Altered mental status, weakness for 3 days, confusion EXAM: CT HEAD WITHOUT CONTRAST TECHNIQUE: Contiguous axial images were obtained from the base of the skull through the vertex without intravenous contrast. RADIATION DOSE REDUCTION: This exam was performed according to the departmental dose-optimization program which includes automated exposure control, adjustment of the mA and/or kV according to patient size and/or use of iterative reconstruction technique. COMPARISON:  01/03/2023 FINDINGS: Brain: No evidence of acute infarction, hemorrhage, hydrocephalus, extra-axial collection or mass lesion/mass effect. Mild periventricular white matter hypodensity. Vascular: No hyperdense vessel or unexpected calcification. Skull: Normal. Negative for fracture or focal lesion. Sinuses/Orbits: No acute finding. Other: None. IMPRESSION: No acute intracranial pathology.  Small-vessel white matter disease. Electronically Signed   By: Jearld Lesch M.D.   On: 01/08/2023 13:38   DG Chest Port 1 View  Result Date: 01/08/2023 CLINICAL DATA:  Altered mental status. EXAM: PORTABLE CHEST 1 VIEW COMPARISON:  01/03/2023 FINDINGS: Low lung volumes. Similar streaky density in the retrocardiac medial left base, likely vascular anatomy. No focal consolidation, pulmonary edema, or pleural effusion. Cardiopericardial silhouette is at upper limits of normal for size. No acute bony abnormality. Telemetry leads overlie the chest. IMPRESSION: No active disease. Electronically Signed   By: Kennith Center M.D.   On: 01/08/2023 12:23    Catarina Hartshorn, DO  [facility]  If  7PM-7AM, please contact night-coverage [email or web address] Password [email or web address] 01/13/2023, 6:42 PM   LOS: 5 days

## 2023-01-13 NOTE — Evaluation (Signed)
Clinical/Bedside Swallow Evaluation Patient Details  Name: Christine [name] MRN: [id number] Date of Birth: 03/10/1951  Today's Date: 01/13/2023 Time: SLP Start Time (ACUTE ONLY): 1001 SLP Stop Time (ACUTE ONLY): 1023 SLP Time Calculation (min) (ACUTE ONLY): 22 min  Past Medical History:  Past Medical History:  Diagnosis Date   Allergy    dust, pollen, sulfa, prednisone   Anemia    Anxiety    Arthritis    "knees" (04/26/2016)   Colon polyps    benign per pt   Coronary artery disease    mild per 2015 cath in [place] (OM1 30%, RCA 30%)   GERD (gastroesophageal reflux disease)    Gout    History of hiatal hernia    Hyperlipidemia 05/20/2021   Hypertension    Migraine    "none since early /2017" (05/06/2016)   Obesity    OSA on CPAP    uses CPAP   Pre-diabetes    Pre-operative cardiovascular examination 08/22/2008   Renal insufficiency 11/09/2022   Vasculitis (HCC)    Bilateral   Past Surgical History:  Past Surgical History:  Procedure Laterality Date   ABDOMINAL HYSTERECTOMY     "partial"; both ovaries present   APPENDECTOMY  05/06/2016   BIOPSY  04/08/2022   Procedure: BIOPSY;  Surgeon: Sherrilyn Rist, MD;  Location: [facility];  Service: Gastroenterology;;   CARDIAC CATHETERIZATION  ~ 2015   CARDIAC CATHETERIZATION  2015   In [place]   CHILECTOMY Right 06/01/2017   Procedure: CHILECTOMY RIGHT FOOT;  Surgeon: Felecia Shelling, DPM;  Location: [facility];  Service: Podiatry;  Laterality: Right;   ESOPHAGOGASTRODUODENOSCOPY (EGD) WITH PROPOFOL N/A 04/08/2022   Procedure: ESOPHAGOGASTRODUODENOSCOPY (EGD) WITH PROPOFOL;  Surgeon: Sherrilyn Rist, MD;  Location: [facility];  Service: Gastroenterology;  Laterality: N/A;   LAPAROSCOPIC APPENDECTOMY N/A 05/06/2016   Procedure: LAPAROSCOPIC APPENDECTOMY;  Surgeon: Manus Rudd, MD;  Location: [facility];  Service: General;  Laterality: N/A;   POLYPECTOMY  04/08/2022   Procedure: POLYPECTOMY;  Surgeon: Sherrilyn Rist, MD;   Location: [facility];  Service: Gastroenterology;;   TONSILLECTOMY     HPI:  72 y/o female with hypertension, hyperlipidemia, OSA on CPAP, GERD, anxiety disorder, anemia, morbid obesity 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 Dossie Der.  She has been progressively declining since surgery.  She has had multiple hospitalizations and ED visits in past several months.  Recently seen at [facility] on 01/03/23 with complaints of weakness and poor oral intake.  She was hospitalized in July and August of this year at different facilities for dehydration, weakness, acute renal failure and failure to thrive.  She has been residing in a SNF.  She apparently has not been eating or drinking and refusing to take her medications.  She was sent to ER today for evaluation of altered mentation.  She has had 3 days of progressive weakness and refusing oral intake.  She complains of pain all over body which is not new per record review. Pt is a poor historian.   She was found to be severely dehydrated with hypoglycemia and hypernatremic with a sodium of 157.  She also had acute kidney injury and distended gallbladder on CT scan.  CT head with no acute findings.  Pt was started on IV hydration and admission was requested.    Assessment / Plan / Recommendation  Clinical Impression  Clinical swallowing evaluation completed while Pt was sitting upright in bed. Pt presents with a primary cognitive based  oral dysphagia comprised of significant oral holding; note no pharyngeal dysphagia was noted. ST hx for Outpatient speech therapy for cognition (attention, memory, and executive functioning) after a fall and concussion July of 2023. Upon SLP entering the room note a cup of pills on her table (Pt had held pills in her mouth until after RN left the room and expectorated them back into the cup). Pt was holding pancakes in her mouth and asleep with a large bolus in her right sulcii when SLP entered the room.  After  being roused by SLP she expectorated residue into the bag. Pt attempted to take one of her pills with orange juice and held pill with orange juice in her mouth for ~ 3 mins. She took one small pill with yogurt and held it for ~1.5 mins. Oral holding despite verbal cues. Pt reports she just "can't swallow" but denies odynophagia. Pt swallowed water, single bites of yogurt and 2 bites of a banana without overt s/sx of aspiration.  Recommend continue with regular diet and thin liquids with recommended strategies: try pills whole with pudding or yogurt, alternate bites and sips, and cues to move bolus around in order to trigger a swallow. ST will f/u to reinforce recommendations and strategies. SLP Visit Diagnosis: Dysphagia, oropharyngeal phase (R13.12)    Aspiration Risk  Mild aspiration risk    Diet Recommendation Regular;Thin liquid    Liquid Administration via: Cup;Straw Medication Administration: Whole meds with puree Supervision: Patient able to self feed Compensations: Minimize environmental distractions Postural Changes: Seated upright at 90 degrees    Other  Recommendations Oral Care Recommendations: Oral care BID    Recommendations for follow up therapy are one component of a multi-disciplinary discharge planning process, led by the attending physician.  Recommendations may be updated based on patient status, additional functional criteria and insurance authorization.           Frequency and Duration min 1 x/week  1 week       Prognosis Prognosis for improved oropharyngeal function: Fair      Swallow Study   General Date of Onset: 01/08/23 HPI: 72 y/o female with hypertension, hyperlipidemia, OSA on CPAP, GERD, anxiety disorder, anemia, morbid obesity 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 Dossie Der.  She has been progressively declining since surgery.  She has had multiple hospitalizations and ED visits in past several months.  Recently seen at  [facility] on 01/03/23 with complaints of weakness and poor oral intake.  She was hospitalized in July and August of this year at different facilities for dehydration, weakness, acute renal failure and failure to thrive.  She has been residing in a SNF.  She apparently has not been eating or drinking and refusing to take her medications.  She was sent to ER today for evaluation of altered mentation.  She has had 3 days of progressive weakness and refusing oral intake.  She complains of pain all over body which is not new per record review. Pt is a poor historian.   She was found to be severely dehydrated with hypoglycemia and hypernatremic with a sodium of 157.  She also had acute kidney injury and distended gallbladder on CT scan.  CT head with no acute findings.  Pt was started on IV hydration and admission was requested. Type of Study: Bedside Swallow Evaluation Previous Swallow Assessment: none in chart Diet Prior to this Study: Regular;Thin liquids (Level 0) Temperature Spikes Noted: No Respiratory Status: Room air History of Recent Intubation:  No Behavior/Cognition: Alert;Cooperative;Pleasant mood Oral Cavity Assessment: Within Functional Limits Oral Care Completed by SLP: Recent completion by staff Oral Cavity - Dentition: Adequate natural dentition Vision: Functional for self-feeding Self-Feeding Abilities: Able to feed self Patient Positioning: Upright in bed Baseline Vocal Quality: Normal Volitional Cough: Strong Volitional Swallow: Able to elicit    Oral/Motor/Sensory Function Overall Oral Motor/Sensory Function: Within functional limits   Ice Chips Ice chips: Within functional limits   Thin Liquid Thin Liquid: Impaired Presentation: Straw;Cup Oral Phase Functional Implications: Oral holding    Nectar Thick Nectar Thick Liquid: Not tested   Honey Thick Honey Thick Liquid: Not tested   Puree Puree: Impaired Oral Phase Functional Implications: Oral holding   Solid     Solid:  Impaired Oral Phase Functional Implications: Oral holding     Laelyn Blumenthal H. Romie Levee, CCC-SLP Speech Language Pathologist  Georgetta Haber 01/13/2023,12:24 PM

## 2023-01-14 ENCOUNTER — Inpatient Hospital Stay (HOSPITAL_COMMUNITY): Payer: Medicare HMO

## 2023-01-14 ENCOUNTER — Telehealth: Payer: Self-pay

## 2023-01-14 DIAGNOSIS — N179 Acute kidney failure, unspecified: Secondary | ICD-10-CM | POA: Diagnosis not present

## 2023-01-14 DIAGNOSIS — E870 Hyperosmolality and hypernatremia: Secondary | ICD-10-CM | POA: Diagnosis not present

## 2023-01-14 DIAGNOSIS — G9341 Metabolic encephalopathy: Secondary | ICD-10-CM | POA: Diagnosis not present

## 2023-01-14 LAB — GLUCOSE, CAPILLARY
Glucose-Capillary: 81 mg/dL (ref 70–99)
Glucose-Capillary: 77 mg/dL (ref 70–99)
Glucose-Capillary: 90 mg/dL (ref 70–99)
Glucose-Capillary: 62 mg/dL — ABNORMAL LOW (ref 70–99)
Glucose-Capillary: 56 mg/dL — ABNORMAL LOW (ref 70–99)
Glucose-Capillary: 94 mg/dL (ref 70–99)

## 2023-01-14 LAB — URINALYSIS, W/ REFLEX TO CULTURE (INFECTION SUSPECTED)
Bilirubin Urine: NEGATIVE
Glucose, UA: NEGATIVE mg/dL
Ketones, ur: NEGATIVE mg/dL
Nitrite: POSITIVE — AB
Protein, ur: 30 mg/dL — AB
Specific Gravity, Urine: 1.012 (ref 1.005–1.030)
WBC, UA: >50 WBC/hpf (ref 0–5)
pH: 5 (ref 5.0–8.0)

## 2023-01-14 LAB — MISC LABCORP TEST (SEND OUT): Labcorp test code: 104018

## 2023-01-14 LAB — BASIC METABOLIC PANEL
Anion gap: 9 (ref 5–15)
BUN: 19 mg/dL (ref 8–23)
CO2: 16 mmol/L — ABNORMAL LOW (ref 22–32)
Calcium: 8.5 mg/dL — ABNORMAL LOW (ref 8.9–10.3)
Chloride: 113 mmol/L — ABNORMAL HIGH (ref 98–111)
Creatinine, Ser: 0.86 mg/dL (ref 0.44–1.00)
GFR, Estimated: >60 mL/min (ref >60)
Glucose, Bld: 90 mg/dL (ref 70–99)
Potassium: 4 mmol/L (ref 3.5–5.1)
Sodium: 138 mmol/L (ref 135–145)

## 2023-01-14 MED ORDER — SODIUM CHLORIDE 0.9 % IV SOLN
1.0000 g | INTRAVENOUS | Status: DC
  Administered 2023-01-14 – 2023-01-16 (×3): 1 g via INTRAVENOUS
  Filled 2023-01-14 (×3): qty 10

## 2023-01-14 MED ORDER — DEXTROSE 50 % IV SOLN
12.5000 g | INTRAVENOUS | Status: AC
  Administered 2023-01-14: 12.5 g via INTRAVENOUS
  Filled 2023-01-14: qty 50

## 2023-01-14 NOTE — Telephone Encounter (Signed)
Caller Name [name] Caller Phone Number [phone] Patient Name Christine [name] Patient DOB 09-26-1950 Call Type Message Only Information Provided Reason for Call Request for General Office Information Initial Comment Caller states she has an appt the end of September but needs to know when. Additional Comment Office hours provided. Disp. Time Disposition Final User 01/14/2023 7:04:03 AM General Information Provided Yes Dorcas Carrow Call Closed By: Dorcas Carrow Transaction Date/Time: 01/14/2023 7:02:34 AM (ET)

## 2023-01-14 NOTE — Telephone Encounter (Signed)
Called to inform next appt is on 01/17/23 at 11 AM Got no answer/and no VM to leave msg.

## 2023-01-14 NOTE — Progress Notes (Signed)
Physical Therapy Treatment Patient Details Name: Christine [name] MRN: [id number] DOB: Jul 06, 1950 Today's Date: 01/14/2023   History of Present Illness Christine [name] is a 72 y/o female with hypertension, hyperlipidemia, OSA on CPAP, GERD, anxiety disorder, anemia, morbid obesity 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 Dossie Der.  She has been progressively declining since surgery.  She has had multiple hospitalizations and ED visits in past several months.  Recently seen at [facility] on 01/03/23 with complaints of weakness and poor oral intake.  She was hospitalized in July and August of this year at different facilities for dehydration, weakness, acute renal failure and failure to thrive.  She has been residing in a SNF.  She apparently has not been eating or drinking and refusing to take her medications.  She was sent to ER today for evaluation of altered mentation.  She has had 3 days of progressive weakness and refusing oral intake.  She complains of pain all over body which is not new per record review. Pt is a poor historian.   She was found to be severely dehydrated with hypoglycemia and hypernatremic with a sodium of 157.  She also had acute kidney injury and distended gallbladder on CT scan.  CT head with no acute findings.  Pt was started on IV hydration and admission was requested.    PT Comments  Pt supine in bed with family present in room and willing to participate with therapy today.  Pt presents with Slow labored movements and easily fatigue required rest breaks during bed mobility due to weakness.  Pt tender to the touch Bil LE.  Mod A with sit to stand and cueing required for hand placement to assist and safety with RW for stability upon standing.  Pt limited to a few feet of gait due to weakness and fatigue.  EOS pt left in chair with call bell within reach.  RN aware and family present at EOS.     If plan is discharge home, recommend the following:     Can travel  by private vehicle        Equipment Recommendations       Recommendations for Other Services       Precautions / Restrictions Precautions Precautions: Fall Restrictions Weight Bearing Restrictions: No     Mobility  Bed Mobility Overal bed mobility: Needs Assistance Bed Mobility: Supine to Sit     Supine to sit: Min assist, Mod assist, HOB elevated     General bed mobility comments: increased time, labored movement    Transfers Overall transfer level: Needs assistance Equipment used: Rolling walker (2 wheels) Transfers: Sit to/from Stand Sit to Stand: Min assist, Mod assist           General transfer comment: has difficulty completing sit to stands due to BLE weakness, Cueing for hand placement to assist with standings and safety with use of RW    Ambulation/Gait Ambulation/Gait assistance: Mod assist Gait Distance (Feet): 5 Feet Assistive device: Rolling walker (2 wheels) Gait Pattern/deviations: Decreased step length - right, Decreased step length - left, Decreased stride length Gait velocity: slow     General Gait Details: limited to a few slow labored unsteady side steps before having to sit due to fatigue   Stairs             Wheelchair Mobility     Tilt Bed    Modified Rankin (Stroke Patients Only)       Balance  Cognition Arousal: Alert Behavior During Therapy: WFL for tasks assessed/performed Overall Cognitive Status: Within Functional Limits for tasks assessed                                          Exercises      General Comments        Pertinent Vitals/Pain Pain Assessment Pain Assessment: 0-10 Pain Location: over bottom when scooting to edge of bed, reports legs are tender to be touched Pain Descriptors / Indicators: Sore, Grimacing, Discomfort Pain Intervention(s): Limited activity within patient's tolerance, Monitored during session,  Repositioned    Home Living                          Prior Function            PT Goals (current goals can now be found in the care plan section)      Frequency           PT Plan      Co-evaluation              AM-PAC PT "6 Clicks" Mobility   Outcome Measure  Help needed turning from your back to your side while in a flat bed without using bedrails?: A Little Help needed moving from lying on your back to sitting on the side of a flat bed without using bedrails?: A Lot Help needed moving to and from a bed to a chair (including a wheelchair)?: A Little Help needed standing up from a chair using your arms (e.g., wheelchair or bedside chair)?: A Lot Help needed to walk in hospital room?: A Lot Help needed climbing 3-5 steps with a railing? : A Lot 6 Click Score: 14    End of Session Equipment Utilized During Treatment: Gait belt Activity Tolerance: Patient tolerated treatment well;Patient limited by fatigue Patient left: in chair;with call bell/phone within reach Nurse Communication: Mobility status PT Visit Diagnosis: Unsteadiness on feet (R26.81);Other abnormalities of gait and mobility (R26.89);Muscle weakness (generalized) (M62.81)     Time: 1610-9604 PT Time Calculation (min) (ACUTE ONLY): 30 min  Charges:    $Therapeutic Activity: 23-37 mins PT General Charges $$ ACUTE PT VISIT: 1 Visit                     Becky Sax, LPTA/CLT; CBIS [phone]  Juel Burrow 01/14/2023, 12:52 PM

## 2023-01-14 NOTE — Consult Note (Signed)
[facility] Customer service manager [facility]) Accountable Care Organization (ACO) [facility] Liaison Note  01/14/2023  Christine [name] Aug 24, 1950 [id number]  Location: [facility] RN Hospital Liaison screened the patient remotely at [facility].  Insurance: [employer]   Christine [name] is a 72 y.o. female who is a Primary Care Patient of Carmelia Roller, Jilda Roche, DO [facility] [employer]. The patient was screened for  readmission hospitalization with noted high risk score for unplanned readmission risk with 3 IP/1 ED in 6 months.  The patient was assessed for potential [facility]) Care Management service needs for post hospital transition for care coordination. Review of patient's electronic medical record reveals patient was admitted for Hypernatremia. Pt will discharge to a SNF for ongoing STR. Facility will continue to manage pt's needs post hospitalization. This is a facility [facility] PAC-RN does not follow.   [facility] Care Management/Population Health does not replace or interfere with any arrangements made by the Inpatient Transition of Care team.   For questions contact:   Elliot Cousin, RN, [facility] Liaison [place]   Population Health Office Hours MTWF  8:00 am-6:00 pm [phone] mobile [phone] [Office toll free line] Office Hours are M-F 8:30 - 5 pm [email or web address]

## 2023-01-14 NOTE — Progress Notes (Addendum)
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 Dossie Der.  She has been progressively declining since surgery.  She has had multiple hospitalizations and ED visits in past several months.  Recently seen at [facility] on [facility]23 with complaints of weakness and poor oral intake.  She was hospitalized in July and August of this year at different facilities for dehydration, weakness, acute renal failure and failure to thrive.  She has been residing in a SNF.  She apparently has not been eating or drinking and refusing to take her medications.  She was sent to ER today for evaluation of altered mentation.  She has had 3 days of progressive weakness and refusing oral intake.  She complains of pain all over body which is not new per record review. Pt is a poor historian.   She was found to be severely dehydrated with hypoglycemia and hypernatremic with a sodium of 157.  She also had acute kidney injury and distended gallbladder on CT scan.  CT head with no acute findings.  Pt was started on IV hydration and admission was requested.     The patient was started on IV hypotonic fluid with D5W with improvement of her electrolytes.  Her hypoglycemia improved on D5W.  Her electrolytes were optimized.  Her renal function gradually improved.  In addition, the patient's mental status improved and essentially returned to baseline.  Her hospitalization has been prolonged secondary to her deconditioning and difficulty finding appropriate placement.  In addition, the patient continues to have failure to thrive.  She continues to have very poor oral intake.  On numerous occasions, the patient with " pocket" pills and food in her mouth and not swallow.  She continued to  show delayed swallowing.  Speech therapy was consulted and did not feel that there was a pharyngeal etiology of the patient's swallowing.  Numerous discussions were held with the patient and family regarding the patient's lack of desire to eat and swallow her nutrition and pills.  Each day, nursing staff it has been an inordinate amount of time encouraging the patient.  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  GI was consulted to assist   Assessment/Plan: Hypernatremia / Severe Dehydration  - pt presented with severe clinical findings of free water deficit  - continue D5W infusion to give free water - monitor sodium levels daily >>improved - change IVF to D51/2NS with KCl   Hypoglycemia  - from poor oral intake and recent gastric bypass  - monitor CBG regularly - continue dextrose infusion >>rate decreased  -pt encouraged innumerable occasions to take po   Adult Failure to Thrive -Patient continues to have poor oral intake and no desire to eat -She continues to " pocket" her pills and food. -Nursing staff has spent an unwarranted amount of time each day encouraging the patient - pt's sister traveled by train from [place] to come here to visit and help with arrangements for post hospital care -9/20 Esophagram--advanced esophageal dysmotility but no obstruction.  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 causing extrinsic compression of the anterior distal esophageal -GI consult for  possible endoscopy  Hypokalemia/Hypomagnesemia - IV replacement ordered -add KCl to IVF   AKI  -baseline creatinine 0.6-1.0 -serum creatinine peaked 1.66 - prerenal from severe dehydration  - continue IV fluid hydration as ordered  - renal function improved with hydration    Acute metabolic  encephalopathy/Failure to Thrive - secondary to severe hypernatremia, AKI - mental status now back to baseline -B12--1183 -folate 11.5 -TSH 1.725 -received 3 days of ceftriaxone -consult speech therapy--no evidence of oropharygeal dysphagia   Generalized Abdominal Pain with gallbladder distension/dilation - given poor oral intake we need to make sure she is not having issues with gallbladder - abdominal US shows distended gallbladder with minimal sludge - slowly advancing diet and she seems to be tolerating with no further complaint of abd pain - follow clinically>>no further abd pain   NAG Metabolic acidosis - added sodium bicarb tablets 9/16   OSA  - nightly CPAP ordered    Essential hypertension  - initially held home antihypertensives until she has been better rehydrated  - pt had been refusing all of her meds for past several days  - BPs now rebounding after hydration - restarted amlodipine 10 mg daily on 9/16     Generalized weakness  - secondary to acute metabolic derangements discussed above - PT eval requested 9/17    Morbid Obesity  - pt is postop s/p 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 Dossie Der. - her preop weight was 294.8 # and her 2nd postop visit she weighed 261.6# - current weight 226.2 #  - continue vitamin supplements    GERD - IV pantoprazole ordered for GI protection until she can take oral again   Sacral pressure wounds - noted below, appreciate wound care nursing team consultation             Family Communication: sister and brother updated at bedside 9/20   Consultants:  none   Code Status:  FULL   DVT Prophylaxis:   [place] Lovenox     Procedures: As Listed in Progress Note Above   Antibiotics: None          Total time spent 50 minutes.  Greater than 50% spent face to face counseling and coordinating care.      Subjective: Patient denies fevers, chills, headache, chest pain, dyspnea, nausea, vomiting,  diarrhea, abdominal pain, dysuria, hematuria, hematochezia, and melena.   Objective: Vitals:   01/13/23 2030 01/14/23 0422 01/14/23 0424 01/14/23 1538  BP: 131/80 99/76  130/73  Pulse: 92 88  84  Resp: 18 18    Temp:  98.2 F (36.8 C)  98.2 F (36.8 C)  TempSrc:  Oral  Oral  SpO2: 100% 100%  100%  Weight:   105.1 kg   Height:        Intake/Output Summary (Last 24 hours) at 01/14/2023 1802 Last data filed at 01/14/2023 1500 Gross per 24 hour  Intake 1098.14 ml  Output --  Net 1098.14 ml   Weight change: 1 kg Exam:  General:  Pt is alert, follows commands appropriately, not in acute distress HEENT: No icterus, No thrush, No neck mass, [place]/AT Cardiovascular: RRR, S1/S2, no rubs, no gallops Respiratory: CTA bilaterally, no wheezing, no crackles, no rhonchi Abdomen: Soft/+BS, non tender, non distended, no guarding Extremities: No edema, No lymphangitis, No petechiae, No rashes, no synovitis   Data Reviewed: I have personally reviewed following labs and imaging studies Basic Metabolic Panel: Recent Labs  Lab 01/09/23 0423 01/10/23 0405 01/11/23 0518 01/12/23 0444 01/13/23  0453 01/14/23 0432  NA 150* 151* 144 139 138 138  K 3.2* 3.5 3.0* 2.7* 3.6 4.0  CL 121* 122* 115* 112* 114* 113*  CO2 17* 17* 19* 17* 17* 16*  GLUCOSE 105* 74 97 84 87 90  BUN 62* 48* 37* 31* 23 19  CREATININE 1.32* 1.24* 1.19* 1.11* 0.98 0.86  CALCIUM 9.0 8.9 8.7* 8.3* 8.4* 8.5*  MG 2.0 2.0  --  1.5* 2.0  --    Liver Function Tests: Recent Labs  Lab 01/08/23 1200 01/09/23 0423 01/10/23 0405  AST 39 32 27  ALT 34 27 24  ALKPHOS 287* 222* 194*  BILITOT 1.0 0.8 0.7  PROT 7.7 6.8 6.3*  ALBUMIN 3.2* 2.8* 2.8*   No results for input(s): "LIPASE", "AMYLASE" in the last 168 hours. Recent Labs  Lab 01/08/23 1200 01/13/23 0453  AMMONIA 11 18   Coagulation Profile: No results for input(s): "INR", "PROTIME" in the last 168 hours. CBC: Recent Labs  Lab 01/08/23 1200 01/09/23 0423  01/10/23 0549 01/11/23 0518 01/13/23 0453  WBC 7.4 8.2 7.1 7.0 5.3  NEUTROABS 4.1  --   --   --   --   HGB 11.5* 10.2* 9.8* 10.1* 9.6*  HCT 38.7 34.0* 31.8* 33.6* 30.6*  MCV 88.4 87.2 88.1 88.2 85.0  PLT 273 240 212 222 214   Cardiac Enzymes: No results for input(s): "CKTOTAL", "CKMB", "CKMBINDEX", "TROPONINI" in the last 168 hours. BNP: Invalid input(s): "POCBNP" CBG: Recent Labs  Lab 01/14/23 0624 01/14/23 0829 01/14/23 1144 01/14/23 1716 01/14/23 1751  GLUCAP 81 77 90 62* 56*   HbA1C: No results for input(s): "HGBA1C" in the last 72 hours. Urine analysis:    Component Value Date/Time   COLORURINE AMBER (A) 01/14/2023 0630   APPEARANCEUR CLOUDY (A) 01/14/2023 0630   LABSPEC 1.012 01/14/2023 0630   PHURINE 5.0 01/14/2023 0630   GLUCOSEU NEGATIVE 01/14/2023 0630   HGBUR MODERATE (A) 01/14/2023 0630   BILIRUBINUR NEGATIVE 01/14/2023 0630   KETONESUR NEGATIVE 01/14/2023 0630   PROTEINUR 30 (A) 01/14/2023 0630   NITRITE POSITIVE (A) 01/14/2023 0630   LEUKOCYTESUR LARGE (A) 01/14/2023 0630   Sepsis Labs: @LABRCNTIP (procalcitonin:4,lacticidven:4) ) Recent Results (from the past 240 hour(s))  SARS Coronavirus 2 by RT PCR (hospital order, performed in [facility] hospital lab) *cepheid single result test* Anterior Nasal Swab     Status: None   Collection Time: 01/08/23 12:18 PM   Specimen: Anterior Nasal Swab  Result Value Ref Range Status   SARS Coronavirus 2 by RT PCR NEGATIVE NEGATIVE Final    Comment: (NOTE) SARS-CoV-2 target nucleic acids are NOT DETECTED.  The SARS-CoV-2 RNA is generally detectable in upper and lower respiratory specimens during the acute phase of infection. The lowest concentration of SARS-CoV-2 viral copies this assay can detect is 250 copies / mL. A negative result does not preclude SARS-CoV-2 infection and should not be used as the sole basis for treatment or other patient management decisions.  A negative result may occur with improper  specimen collection / handling, submission of specimen other than nasopharyngeal swab, presence of viral mutation(s) within the areas targeted by this assay, and inadequate number of viral copies (<250 copies / mL). A negative result must be combined with clinical observations, patient history, and epidemiological information.  Fact Sheet for Patients:   RoadLapTop.co.za  Fact Sheet for Healthcare Providers: http://kim-miller.com/  This test is not yet approved or  cleared by the [place] FDA and has been authorized for detection and/or  diagnosis of SARS-CoV-2 by FDA under an Emergency Use Authorization (EUA).  This EUA will remain in effect (meaning this test can be used) for the duration of the COVID-19 declaration under Section 564(b)(1) of the Act, 21 U.S.C. section 360bbb-3(b)(1), unless the authorization is terminated or revoked sooner.  Performed at [facility], [address] [place], [place] [address]   MRSA Next Gen by PCR, Nasal     Status: None   Collection Time: 01/08/23  5:50 PM   Specimen: Nasal Mucosa; Nasal Swab  Result Value Ref Range Status   MRSA by PCR Next Gen NOT DETECTED NOT DETECTED Final    Comment: (NOTE) The GeneXpert MRSA Assay (FDA approved for NASAL specimens only), is one component of a comprehensive MRSA colonization surveillance program. It is not intended to diagnose MRSA infection nor to guide or monitor treatment for MRSA infections. Test performance is not FDA approved in patients less than 49 years old. Performed at [facility], [address], [place], [place] [address]   Gastrointestinal Panel by PCR , Stool     Status: None   Collection Time: 01/08/23  6:11 PM   Specimen: Stool  Result Value Ref Range Status   Campylobacter species NOT DETECTED NOT DETECTED Final   Plesimonas shigelloides NOT DETECTED NOT DETECTED Final   Salmonella species NOT DETECTED NOT DETECTED Final   Yersinia  enterocolitica NOT DETECTED NOT DETECTED Final   Vibrio species NOT DETECTED NOT DETECTED Final   Vibrio cholerae NOT DETECTED NOT DETECTED Final   Enteroaggregative E coli (EAEC) NOT DETECTED NOT DETECTED Final   Enteropathogenic E coli (EPEC) NOT DETECTED NOT DETECTED Final   Enterotoxigenic E coli (ETEC) NOT DETECTED NOT DETECTED Final   Shiga like toxin producing E coli (STEC) NOT DETECTED NOT DETECTED Final   Shigella/Enteroinvasive E coli (EIEC) NOT DETECTED NOT DETECTED Final   Cryptosporidium NOT DETECTED NOT DETECTED Final   Cyclospora cayetanensis NOT DETECTED NOT DETECTED Final   Entamoeba histolytica NOT DETECTED NOT DETECTED Final   Giardia lamblia NOT DETECTED NOT DETECTED Final   Adenovirus F40/41 NOT DETECTED NOT DETECTED Final   Astrovirus NOT DETECTED NOT DETECTED Final   Norovirus GI/GII NOT DETECTED NOT DETECTED Final   Rotavirus A NOT DETECTED NOT DETECTED Final   Sapovirus (I, II, IV, and V) NOT DETECTED NOT DETECTED Final    Comment: Performed at [facility], [address] [place], [place] [address]  C Difficile Quick Screen w PCR reflex     Status: None   Collection Time: 01/08/23  6:15 PM   Specimen: Stool  Result Value Ref Range Status   C Diff antigen NEGATIVE NEGATIVE Final   C Diff toxin NEGATIVE NEGATIVE Final   C Diff interpretation No C. difficile detected.  Final    Comment: Performed at [facility], [address] [place], [place] [address]     Scheduled Meds:  (feeding supplement) PROSource Plus  30 mL Oral BID BM   amLODipine  10 mg Oral Daily   antiseptic oral rinse  15 mL Mouth Rinse BID   colchicine  0.6 mg Oral Daily   DULoxetine  60 mg Oral Daily   enoxaparin (LOVENOX) injection  40 mg Subcutaneous Q24H   feeding supplement (GLUCERNA SHAKE)  237 mL Oral TID BM   ferrous sulfate  325 mg Oral Q breakfast   multivitamin with minerals  1 tablet Oral Daily   oxybutynin  15 mg Oral QHS   pantoprazole  40 mg  Oral Daily   sodium  bicarbonate  650 mg Oral TID   Continuous Infusions:  cefTRIAXone (ROCEPHIN)  IV 1 g (01/14/23 1245)   dextrose 5 % and 0.45 % NaCl with KCl 40 mEq/L 50 mL/hr at 01/13/23 1405    Procedures/Studies: DG ESOPHAGUS W SINGLE CM (SOL OR THIN BA)  Result Date: 01/14/2023 CLINICAL DATA:  Patient with a history of dysphagia. EXAM: ESOPHAGUS/BARIUM SWALLOW/TABLET STUDY TECHNIQUE: Single contrast examination was performed using thin liquid barium. This exam was performed by Alwyn Ren, NP, and was supervised and interpreted by Rad . FLUOROSCOPY: Fluoro time: 1.12 sec COMPARISON:  None Available. FINDINGS: Swallowing: Significant delay in swallow initiation/mechanism. No vestibular penetration or aspiration seen. Pharynx: Unremarkable. Esophagus: Prominent cricopharyngeal bar. Small diverticulum in the lower esophagus. Questionable extrinsic compression along the anterior aspect of the distal esophagus, best seen on image 45 of series 4. Esophageal motility: Advanced dysmotility with spasms. Hiatal Hernia: None. Gastroesophageal reflux: None visualized. Ingested 13 mm barium tablet: Not given. Patient needs pills crushed. Other: None. IMPRESSION: 1. 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 Further evaluation with endoscopy is recommended. 2. Significant delay in swallow initiation/mechanism. Prominent cricopharyngeal bar. 3. Advanced esophageal dysmotility with spasms. 4. Limited study due to patient's body habitus and mobility limitations. Electronically Signed   By: Acquanetta Belling M.D.   On: 01/14/2023 13:39   US Abdomen Complete  Result Date: 01/09/2023 CLINICAL DATA:  [id number] Gallbladder dilatation [id number] Generalized abdominal pain [id number] EXAM: ABDOMEN ULTRASOUND COMPLETE COMPARISON:  None Available. FINDINGS: Gallbladder: Gallbladder appears distended. Echogenic structure in the gallbladder is consistent with  sludge. No shadowing gallstones or wall thickening visualized. No sonographic Murphy sign noted by sonographer. Common bile duct: Diameter: 0.4cm. Liver: No focal lesion identified. Hyperechoic consistent with fatty infiltration. IVC: No abnormality visualized. Pancreas: Obscured by bowel gas. Spleen: Size and appearance within normal limits. Right Kidney: Length: 10.6 cm. Echogenicity increased consistent with chronic medical renal disease. No mass or hydronephrosis visualized. No shadowing stones. Left Kidney: Length: 9.6cm. Echogenicity increased consistent with chronic medical renal disease. Cyst measures 5.5 cm. No follow up recommended based on the ultrasound appearance of the cyst. No solid mass or hydronephrosis visualized. No shadowing stones. Abdominal aorta: Obscured by bowel gas. IMPRESSION: 1. Hepatic fatty infiltration. 2. Gallbladder distended. 3. Minimal gallbladder sludge. 4. Echogenic kidneys consistent with chronic medical renal disease. 5. Left kidney cyst. 6. There was limited visualization, as described. Electronically Signed   By: Layla Maw M.D.   On: 01/09/2023 11:17   CT ABDOMEN PELVIS WO CONTRAST  Result Date: 01/08/2023 CLINICAL DATA:  Abdominal pain. EXAM: CT ABDOMEN AND PELVIS WITHOUT CONTRAST TECHNIQUE: Multidetector CT imaging of the abdomen and pelvis was performed following the standard protocol without IV contrast. RADIATION DOSE REDUCTION: This exam was performed according to the departmental dose-optimization program which includes automated exposure control, adjustment of the mA and/or kV according to patient size and/or use of iterative reconstruction technique. COMPARISON:  CT 10/15/2022 and older FINDINGS: Evaluation significant limited by motion despite multiple attempts. As per the CT technologist, the patient had difficulty with the breathing instructions. Lower chest: Lung bases are limited by motion. There is some basilar atelectasis. Coronary artery  calcifications are seen. Hepatobiliary: Grossly preserved parenchyma. Gallbladder is dilated. Pancreas: No obvious pancreatic mass. Spleen: Spleen is nonenlarged. Adrenals/Urinary Tract: Nodularity of the adrenal glands, poorly defined with the motion. No renal collecting system dilatation. No obvious obstructing stones.  There is some cystic lesions in the left kidney including a larger lower pole focus with some septations and calcifications. Again evaluation is limited but appears similar to previous. Mildly distended urinary bladder. Stomach/Bowel: Surgical changes gastric bypass. The stomach, small and large bowel are nondilated. There are some scattered stool. Details of the bowel are limited. Vascular/Lymphatic: Scattered vascular calcifications. No obvious dilatation of the abdominal aorta. IVC is nondilated. No obvious lymph node enlargement. Reproductive: Status post hysterectomy. No adnexal masses. Other: Markedly limited by motion. No obvious fluid collections. No large amounts of free air. Musculoskeletal: Scattered degenerative changes of the spine and pelvis. Overall moderate to severe. IMPRESSION: Distended gallbladder. If there is concern of gallbladder pathology ultrasound may be useful as the next step in the workup. Grossly stable left-sided renal cystic foci. Surgical changes of previous gastric bypass Markedly limited examination due to the level of motion. No obvious obstruction, free air or free fluid. In addition, a repeat study could be considered when the patient is more clinically able. Electronically Signed   By: Karen Kays M.D.   On: 01/08/2023 13:41   CT Head Wo Contrast  Result Date: 01/08/2023 CLINICAL DATA:  Altered mental status, weakness for 3 days, confusion EXAM: CT HEAD WITHOUT CONTRAST TECHNIQUE: Contiguous axial images were obtained from the base of the skull through the vertex without intravenous contrast. RADIATION DOSE REDUCTION: This exam was performed according to  the departmental dose-optimization program which includes automated exposure control, adjustment of the mA and/or kV according to patient size and/or use of iterative reconstruction technique. COMPARISON:  01/03/2023 FINDINGS: Brain: No evidence of acute infarction, hemorrhage, hydrocephalus, extra-axial collection or mass lesion/mass effect. Mild periventricular white matter hypodensity. Vascular: No hyperdense vessel or unexpected calcification. Skull: Normal. Negative for fracture or focal lesion. Sinuses/Orbits: No acute finding. Other: None. IMPRESSION: No acute intracranial pathology.  Small-vessel white matter disease. Electronically Signed   By: Jearld Lesch M.D.   On: 01/08/2023 13:38   DG Chest Port 1 View  Result Date: 01/08/2023 CLINICAL DATA:  Altered mental status. EXAM: PORTABLE CHEST 1 VIEW COMPARISON:  01/03/2023 FINDINGS: Low lung volumes. Similar streaky density in the retrocardiac medial left base, likely vascular anatomy. No focal consolidation, pulmonary edema, or pleural effusion. Cardiopericardial silhouette is at upper limits of normal for size. No acute bony abnormality. Telemetry leads overlie the chest. IMPRESSION: No active disease. Electronically Signed   By: Kennith Center M.D.   On: 01/08/2023 12:23    Catarina Hartshorn, DO  [facility]  If 7PM-7AM, please contact night-coverage [email or web address] Password [email or web address] 01/14/2023, 6:02 PM   LOS: 6 days

## 2023-01-14 NOTE — Telephone Encounter (Signed)
Called again and no answer///no VM to leave a message. If Patient returns call ok to give her upcoming appt date/time

## 2023-01-14 NOTE — Care Management Important Message (Signed)
Important Message  Patient Details  Name: Christine [name] MRN: [id number] Date of Birth: 29-Nov-1950   Medicare Important Message Given:  Yes     Corey Harold 01/14/2023, 11:52 AM

## 2023-01-14 NOTE — TOC Progression Note (Signed)
Transition of Care [facility]) - Progression Note    Patient Details  Name: Christine [name] MRN: [id number] Date of Birth: Sep 23, 1950  [facility] CM/SW Contact  Villa Herb, [place] Phone Number: 01/14/2023, 10:23 AM  Clinical Narrative:    CSW spoke to Aurora with Central Intake at [facility] who states pts SNF [facility] is still pending at this time. TOC to follow.   Expected Discharge Plan: Skilled Nursing Facility Barriers to Discharge: Continued Medical Work up  Expected Discharge Plan and Services In-house Referral: Clinical Social Work Discharge Planning Services: CM Consult Post Acute Care Choice: Skilled Nursing Facility Living arrangements for the past 2 months: Skilled Nursing Facility                                       Social Determinants of Health (SDOH) Interventions SDOH Screenings   Food Insecurity: Patient Unable To Answer (01/08/2023)  Housing: Patient Declined (01/08/2023)  Transportation Needs: Patient Unable To Answer (01/08/2023)  Utilities: Patient Unable To Answer (01/08/2023)  Alcohol Screen: Low Risk  (07/13/2022)  Depression (PHQ2-9): Low Risk  (01/21/2022)  Financial Resource Strain: Low Risk  (12/01/2022)   [facility]  Physical Activity: Insufficiently Active (07/13/2022)  Social Connections: Moderately Integrated (12/01/2022)   Received from [facility]  Stress: No Stress Concern Present (07/13/2022)  Tobacco Use: Medium Risk (01/08/2023)  Health Literacy: Medium Risk (12/01/2022)   [facility] from [facility]    Readmission Risk Interventions    01/09/2023   11:11 AM  Readmission Risk Prevention Plan  Transportation Screening Complete  HRI or Home Care Consult Complete  Social Work Consult for Recovery Care Planning/Counseling Complete  Palliative Care Screening Not Applicable  Medication Review Oceanographer) Complete

## 2023-01-15 ENCOUNTER — Inpatient Hospital Stay (HOSPITAL_COMMUNITY): Payer: Medicare HMO

## 2023-01-15 DIAGNOSIS — R1319 Other dysphagia: Secondary | ICD-10-CM | POA: Diagnosis not present

## 2023-01-15 DIAGNOSIS — E162 Hypoglycemia, unspecified: Secondary | ICD-10-CM

## 2023-01-15 DIAGNOSIS — R627 Adult failure to thrive: Secondary | ICD-10-CM | POA: Diagnosis not present

## 2023-01-15 DIAGNOSIS — N179 Acute kidney failure, unspecified: Secondary | ICD-10-CM | POA: Diagnosis not present

## 2023-01-15 DIAGNOSIS — R933 Abnormal findings on diagnostic imaging of other parts of digestive tract: Secondary | ICD-10-CM | POA: Diagnosis not present

## 2023-01-15 DIAGNOSIS — E860 Dehydration: Secondary | ICD-10-CM | POA: Diagnosis not present

## 2023-01-15 DIAGNOSIS — E870 Hyperosmolality and hypernatremia: Secondary | ICD-10-CM | POA: Diagnosis not present

## 2023-01-15 DIAGNOSIS — G9341 Metabolic encephalopathy: Secondary | ICD-10-CM | POA: Diagnosis not present

## 2023-01-15 LAB — BASIC METABOLIC PANEL
Anion gap: 9 (ref 5–15)
BUN: 14 mg/dL (ref 8–23)
CO2: 16 mmol/L — ABNORMAL LOW (ref 22–32)
Calcium: 8.7 mg/dL — ABNORMAL LOW (ref 8.9–10.3)
Chloride: 116 mmol/L — ABNORMAL HIGH (ref 98–111)
Creatinine, Ser: 0.85 mg/dL (ref 0.44–1.00)
GFR, Estimated: >60 mL/min (ref >60)
Glucose, Bld: 88 mg/dL (ref 70–99)
Potassium: 3.8 mmol/L (ref 3.5–5.1)
Sodium: 141 mmol/L (ref 135–145)

## 2023-01-15 LAB — GLUCOSE, CAPILLARY
Glucose-Capillary: 75 mg/dL (ref 70–99)
Glucose-Capillary: 57 mg/dL — ABNORMAL LOW (ref 70–99)
Glucose-Capillary: 81 mg/dL (ref 70–99)
Glucose-Capillary: 66 mg/dL — ABNORMAL LOW (ref 70–99)
Glucose-Capillary: 87 mg/dL (ref 70–99)
Glucose-Capillary: 54 mg/dL — ABNORMAL LOW (ref 70–99)
Glucose-Capillary: 120 mg/dL — ABNORMAL HIGH (ref 70–99)
Glucose-Capillary: 64 mg/dL — ABNORMAL LOW (ref 70–99)
Glucose-Capillary: 117 mg/dL — ABNORMAL HIGH (ref 70–99)

## 2023-01-15 MED ORDER — IOHEXOL 300 MG/ML  SOLN
75.0000 mL | Freq: Once | INTRAMUSCULAR | Status: AC | PRN
  Administered 2023-01-15: 75 mL via INTRAVENOUS

## 2023-01-15 MED ORDER — DEXTROSE 50 % IV SOLN
12.5000 g | INTRAVENOUS | Status: AC
  Administered 2023-01-15: 12.5 g via INTRAVENOUS
  Filled 2023-01-15: qty 50

## 2023-01-15 MED ORDER — FOLIC ACID 5 MG/ML IJ SOLN
INTRAMUSCULAR | Status: AC
  Filled 2023-01-15: qty 0.2

## 2023-01-15 MED ORDER — SODIUM CHLORIDE 0.9 % IV SOLN
1.0000 mg | Freq: Once | INTRAVENOUS | Status: AC
  Administered 2023-01-15: 1 mg via INTRAVENOUS
  Filled 2023-01-15: qty 0.2

## 2023-01-15 MED ORDER — DEXTROSE 50 % IV SOLN
INTRAVENOUS | Status: AC
  Filled 2023-01-15: qty 50

## 2023-01-15 MED ORDER — KCL IN DEXTROSE-NACL 20-5-0.45 MEQ/L-%-% IV SOLN: INTRAVENOUS | Status: DC

## 2023-01-15 MED ORDER — DEXTROSE 50 % IV SOLN
25.0000 g | INTRAVENOUS | Status: AC
  Administered 2023-01-15: 25 g via INTRAVENOUS
  Filled 2023-01-15: qty 50

## 2023-01-15 MED ORDER — DEXTROSE 50 % IV SOLN
12.5000 g | INTRAVENOUS | Status: AC
  Administered 2023-01-15: 12.5 g via INTRAVENOUS

## 2023-01-15 NOTE — Plan of Care (Signed)
  Problem: Pain Management: Goal: Pain level will decrease Outcome: Progressing   Problem: Clinical Measurements: Goal: Ability to maintain clinical measurements within normal limits will improve Outcome: Progressing

## 2023-01-15 NOTE — Progress Notes (Signed)
Patient had three episodes of hypoglycemia during shift, unable to tolerate PO intake see previous notes. Attempted to give patient morning medications crushed in yogurt, patient held medications in mouth then spit them out, Md aware. Changed patients wound dressings per order. Repositioned patient in bed for comfort. Patient verbalized some complaints of pain given PRN, see MAR.

## 2023-01-15 NOTE — Progress Notes (Signed)
Pt had BG 64 at bedtime. Gave 12.5mg  D50 IV per hypoglycemia protocol. Also, pt refused all po medications. She nodded yes when asked if she had trouble swallowing. Gave Tylenol suppository for Temp of 100.4. temp.

## 2023-01-15 NOTE — Progress Notes (Signed)
Hypoglycemic Event  CBG: 66 mg/dL  Treatment: X52 25 mL (12.5 gm)  Symptoms: None  Follow-up CBG: Time:1229 CBG Result:87  Possible Reasons for Event: Inadequate meal intake  Comments/MD notified: CBG 66 mg/dL, noted no s/s of hypoglycemia. Patient continues with poor PO intake. Given Dextrose 50% solution 12.5 grams. Rechecked CBG was 87 mg/dL. MD Tat made aware.    Christine [name]

## 2023-01-15 NOTE — Progress Notes (Addendum)
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 Dossie Der.  She has been progressively declining since surgery.  She has had multiple hospitalizations and ED visits in past several months.  Recently seen at [facility] on 01/03/23 with complaints of weakness and poor oral intake.  She was hospitalized in July and August of this year at different facilities for dehydration, weakness, acute renal failure and failure to thrive.  She has been residing in a SNF.  She apparently has not been eating or drinking and refusing to take her medications.  She was sent to ER today for evaluation of altered mentation.  She has had 3 days of progressive weakness and refusing oral intake.  She complains of pain all over body which is not new per record review. Pt is a poor historian.   She was found to be severely dehydrated with hypoglycemia and hypernatremic with a sodium of 157.  She also had acute kidney injury and distended gallbladder on CT scan.  CT head with no acute findings.  Pt was started on IV hydration and admission was requested.     The patient was started on IV hypotonic fluid with D5W with improvement of her electrolytes.  Her hypoglycemia improved on D5W.  Her electrolytes were optimized.  Her renal function gradually improved.  In addition, the patient's mental status improved and essentially returned to baseline.  Her hospitalization has been prolonged secondary to her deconditioning and difficulty finding appropriate placement.  In addition, the patient continues to have failure to thrive.  She continues to have very poor oral intake.  On numerous occasions, the patient with " pocket" pills and food in her mouth and not swallow.  She continued to  show delayed swallowing.  Speech therapy was consulted and did not feel that there was a pharyngeal etiology of the patient's swallowing.  Numerous discussions were held with the patient and family regarding the patient's lack of desire to eat and swallow her nutrition and pills.  Each day, nursing staff it has been an inordinate amount of time encouraging the patient.  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  GI was consulted to assist   Assessment/Plan: Hypernatremia / Severe Dehydration  - pt presented with severe clinical findings of free water deficit  - continue D5W infusion to give free water - monitor sodium levels daily >>improved - change IVF to D51/2NS with KCl   Hypoglycemia  - from poor oral intake and recent gastric bypass  - monitor CBG regularly - continue dextrose infusion >>rate decreased  -pt encouraged innumerable occasions to take po -D51/2NS turned off x 24 hours and pt has recurrent hypoglycemia -am cortisol   Adult Failure to Thrive/Esophageal dysmotility -Patient continues to have poor oral intake and no desire to eat -She continues to " pocket" her pills and food. -Nursing staff has spent an unwarranted amount of time each day encouraging the patient - pt's sister traveled by train from [place] to come here to visit and help with arrangements for post hospital care -9/20 Esophagram--advanced esophageal dysmotility but no obstruction.  It also showed Distal esophageal diverticulum with  possible associated mass causing extrinsic compression of the anterior distal esophageal -appreciate GI consult>>CT chest   Hypokalemia/Hypomagnesemia - IV replacement ordered -add KCl to IVF   AKI  -baseline creatinine 0.6-1.0 -serum creatinine peaked 1.66 - prerenal from severe dehydration  -  continue IV fluid hydration as ordered  - renal function improved--back to baseline   Acute metabolic encephalopathy/Failure to Thrive - secondary to severe hypernatremia, AKI - mental status now back to baseline -B12--1183 -folate 11.5 -TSH 1.725 -ammonia = 18 -received 3 days of ceftriaxone previously -consult speech therapy--no evidence of oropharygeal dysphagia -recheck UA >50 WBC  UTI -01/14/23 UA >50 WBC -urine culture  = E coli -continue ceftriaxone   Generalized Abdominal Pain with gallbladder distension/dilation - given poor oral intake we need to make sure she is not having issues with gallbladder - abdominal US shows distended gallbladder with minimal sludge - slowly advancing diet and she seems to be tolerating with no further complaint of abd pain - follow clinically>>no further abd pain   NAG Metabolic acidosis - added sodium bicarb tablets 9/16   OSA  - nightly CPAP ordered    Essential hypertension  - initially held home antihypertensives until she has been better rehydrated  - pt had been refusing all of her meds for past several days  - BPs now rebounding after hydration - restarted amlodipine 10 mg daily on 9/16     Generalized weakness  - secondary to acute metabolic derangements discussed above - PT eval requested 9/17    Morbid Obesity  - pt is postop s/p 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 Dossie Der. - her preop weight was 294.8 # and her 2nd postop visit she weighed 261.6# - current weight 226.2 #  - continue vitamin supplements    GERD - IV pantoprazole ordered for GI protection until she can take oral again   Sacral pressure wounds - noted below, appreciate wound care nursing team consultation     Goals of care -discussed with sister, brother, patient>>pt does not want gastrostomy tube -consult palliative medicine -discussed other options including duotube/dobhoff vs TPN        Family Communication: sister and  brother updated at bedside 9/20   Consultants:  none   Code Status:  FULL   DVT Prophylaxis:   Industry Lovenox     Procedures: As Listed in Progress Note Above   Antibiotics: Ceftriaxone 9/20>>          Subjective: Patient denies fevers, chills, headache, chest pain, dyspnea, nausea, vomiting, diarrhea, abdominal pain, dysuria,    Objective: Vitals:   01/14/23 1538 01/14/23 1900 01/15/23 0500 01/15/23 1409  BP: 130/73 (!) 145/96 127/64 104/83  Pulse: 84 93 93 78  Resp:   18 17  Temp: 98.2 F (36.8 C) 98 F (36.7 C) 98.1 F (36.7 C) 98.9 F (37.2 C)  TempSrc: Oral Oral Oral   SpO2: 100% 94% 100% 100%  Weight:   104.8 kg   Height:        Intake/Output Summary (Last 24 hours) at 01/15/2023 1630 Last data filed at 01/15/2023 1300 Gross per 24 hour  Intake 60 ml  Output 150 ml  Net -90 ml   Weight change: -0.3 kg Exam:  General:  Pt is alert, follows commands appropriately, not in acute distress HEENT: No icterus, No thrush, No neck mass, [place]/AT Cardiovascular: RRR, S1/S2, no rubs, no gallops Respiratory: fine bibasilar crackles.  No wheeze Abdomen: Soft/+BS, non tender, non distended, no guarding Extremities:  1 + LE edema, No lymphangitis, No petechiae, No rashes, no synovitis   Data Reviewed: I have personally reviewed following labs and imaging studies Basic Metabolic Panel: Recent Labs  Lab 01/09/23 0423 01/10/23 0405 01/11/23 0518 01/12/23 0444 01/13/23 0453 01/14/23 0432 01/15/23 0439  NA 150* 151* 144 139 138 138 141  K 3.2* 3.5 3.0* 2.7* 3.6 4.0 3.8  CL 121* 122* 115* 112* 114* 113* 116*  CO2 17* 17* 19* 17* 17* 16* 16*  GLUCOSE 105* 74 97 84 87 90 88  BUN 62* 48* 37* 31* 23 19 14   CREATININE 1.32* 1.24* 1.19* 1.11* 0.98 0.86 0.85  CALCIUM 9.0 8.9 8.7* 8.3* 8.4* 8.5* 8.7*  MG 2.0 2.0  --  1.5* 2.0  --   --    Liver Function Tests: Recent Labs  Lab 01/09/23 0423 01/10/23 0405  AST 32 27  ALT 27 24  ALKPHOS 222* 194*  BILITOT 0.8  0.7  PROT 6.8 6.3*  ALBUMIN 2.8* 2.8*   No results for input(s): "LIPASE", "AMYLASE" in the last 168 hours. Recent Labs  Lab 01/13/23 0453  AMMONIA 18   Coagulation Profile: No results for input(s): "INR", "PROTIME" in the last 168 hours. CBC: Recent Labs  Lab 01/09/23 0423 01/10/23 0549 01/11/23 0518 01/13/23 0453  WBC 8.2 7.1 7.0 5.3  HGB 10.2* 9.8* 10.1* 9.6*  HCT 34.0* 31.8* 33.6* 30.6*  MCV 87.2 88.1 88.2 85.0  PLT 240 212 222 214   Cardiac Enzymes: No results for input(s): "CKTOTAL", "CKMB", "CKMBINDEX", "TROPONINI" in the last 168 hours. BNP: Invalid input(s): "POCBNP" CBG: Recent Labs  Lab 01/15/23 0427 01/15/23 0715 01/15/23 0746 01/15/23 1133 01/15/23 1229  GLUCAP 75 57* 81 66* 87   HbA1C: No results for input(s): "HGBA1C" in the last 72 hours. Urine analysis:    Component Value Date/Time   COLORURINE AMBER (A) 01/14/2023 0630   APPEARANCEUR CLOUDY (A) 01/14/2023 0630   LABSPEC 1.012 01/14/2023 0630   PHURINE 5.0 01/14/2023 0630   GLUCOSEU NEGATIVE 01/14/2023 0630   HGBUR MODERATE (A) 01/14/2023 0630   BILIRUBINUR NEGATIVE 01/14/2023 0630   KETONESUR NEGATIVE 01/14/2023 0630   PROTEINUR 30 (A) 01/14/2023 0630   NITRITE POSITIVE (A) 01/14/2023 0630   LEUKOCYTESUR LARGE (A) 01/14/2023 0630   Sepsis Labs: @LABRCNTIP (procalcitonin:4,lacticidven:4) ) Recent Results (from the past 240 hour(s))  SARS Coronavirus 2 by RT PCR (hospital order, performed in [facility] hospital lab) *cepheid single result test* Anterior Nasal Swab     Status: None   Collection Time: 01/08/23 12:18 PM   Specimen: Anterior Nasal Swab  Result Value Ref Range Status   SARS Coronavirus 2 by RT PCR NEGATIVE NEGATIVE Final    Comment: (NOTE) SARS-CoV-2 target nucleic acids are NOT DETECTED.  The SARS-CoV-2 RNA is generally detectable in upper and lower respiratory specimens during the acute phase of infection. The lowest concentration of SARS-CoV-2 viral copies this assay  can detect is 250 copies / mL. A negative result does not preclude SARS-CoV-2 infection and should not be used as the sole basis for treatment or other patient management decisions.  A negative result may occur with improper specimen collection / handling, submission of specimen other than nasopharyngeal swab, presence of viral mutation(s) within the areas targeted by this assay, and inadequate number of viral copies (<250 copies / mL). A negative result must be combined with clinical observations, patient history, and epidemiological information.  Fact Sheet for Patients:   RoadLapTop.co.za  Fact Sheet for Healthcare Providers: http://kim-miller.com/  This test is not yet approved or  cleared by the [place] and has been authorized for detection and/or diagnosis of SARS-CoV-2 by FDA under an Emergency Use Authorization (EUA).  This EUA will remain in effect (meaning this test can be used) for the duration of the COVID-19 declaration under Section 564(b)(1) of the Act, 21 U.S.C. section 360bbb-3(b)(1), unless the authorization is terminated or revoked sooner.  Performed at [facility], [address] [place], [place] [address]   MRSA Next Gen by PCR, Nasal     Status: None   Collection Time: 01/08/23  5:50 PM   Specimen: Nasal Mucosa; Nasal Swab  Result Value Ref Range Status   MRSA by PCR Next Gen NOT DETECTED NOT DETECTED Final    Comment: (NOTE) The GeneXpert MRSA Assay (FDA approved for NASAL specimens only), is one component of a comprehensive MRSA colonization surveillance program. It is not intended to diagnose MRSA infection nor to guide or monitor treatment for MRSA infections. Test performance is not FDA approved in patients less than 48 years old. Performed at [facility], [address], [place], [place] [address]   Gastrointestinal Panel by PCR , Stool     Status: None   Collection Time: 01/08/23  6:11 PM    Specimen: Stool  Result Value Ref Range Status   Campylobacter species NOT DETECTED NOT DETECTED Final   Plesimonas shigelloides NOT DETECTED NOT DETECTED Final   Salmonella species NOT DETECTED NOT DETECTED Final   Yersinia enterocolitica NOT DETECTED NOT DETECTED Final   Vibrio species NOT DETECTED NOT DETECTED Final   Vibrio cholerae NOT DETECTED NOT DETECTED Final   Enteroaggregative E coli (EAEC) NOT DETECTED NOT DETECTED Final   Enteropathogenic E coli (EPEC) NOT DETECTED NOT DETECTED Final   Enterotoxigenic E coli (ETEC) NOT DETECTED NOT DETECTED Final   Shiga like toxin producing E coli (STEC) NOT DETECTED NOT DETECTED Final   Shigella/Enteroinvasive E coli (EIEC) NOT DETECTED NOT DETECTED Final   Cryptosporidium NOT DETECTED NOT DETECTED Final   Cyclospora cayetanensis NOT DETECTED NOT DETECTED Final   Entamoeba histolytica NOT DETECTED NOT DETECTED Final   Giardia lamblia NOT DETECTED NOT DETECTED Final   Adenovirus F40/41 NOT DETECTED NOT DETECTED Final   Astrovirus NOT DETECTED NOT DETECTED Final   Norovirus GI/GII NOT DETECTED NOT DETECTED Final   Rotavirus A NOT DETECTED NOT DETECTED Final   Sapovirus (I, II, IV, and V) NOT DETECTED NOT DETECTED Final    Comment: Performed at [facility], [address] [place], [place] [address]  C Difficile Quick Screen w PCR reflex     Status: None   Collection Time: 01/08/23  6:15 PM   Specimen: Stool  Result Value Ref Range Status   C Diff antigen NEGATIVE NEGATIVE Final   C Diff toxin NEGATIVE NEGATIVE Final   C Diff interpretation No C. difficile detected.  Final    Comment: Performed at [facility], [address], [place], [place] [address]  Urine Culture     Status: Abnormal (Preliminary result)   Collection Time: 01/14/23  6:30 AM   Specimen: Urine, Random  Result Value Ref Range Status   Specimen Description   Final    URINE, RANDOM Performed at [facility], [address] [place], [place] [address]     Special Requests   Final    NONE Reflexed from [id number] Performed at [facility], [address] [place], [place] [address]    Culture (A)  Final    >=  100,000 COLONIES/mL ESCHERICHIA COLI SUSCEPTIBILITIES TO FOLLOW Performed at [facility], [address], [place], [place] [address]    Report Status PENDING  Incomplete     Scheduled Meds:  (feeding supplement) PROSource Plus  30 mL Oral BID BM   amLODipine  10 mg Oral Daily   antiseptic oral rinse  15 mL Mouth Rinse BID   colchicine  0.6 mg Oral Daily   DULoxetine  60 mg Oral Daily   enoxaparin (LOVENOX) injection  40 mg Subcutaneous Q24H   feeding supplement (GLUCERNA SHAKE)  237 mL Oral TID BM   ferrous sulfate  325 mg Oral Q breakfast   multivitamin with minerals  1 tablet Oral Daily   oxybutynin  15 mg Oral QHS   pantoprazole  40 mg Oral Daily   sodium bicarbonate  650 mg Oral TID   Continuous Infusions:  cefTRIAXone (ROCEPHIN)  IV 1 g (01/15/23 0921)   dextrose 5 % and 0.45 % NaCl with KCl 20 mEq/L      Procedures/Studies: DG ESOPHAGUS W SINGLE CM (SOL OR THIN BA)  Result Date: 01/14/2023 CLINICAL DATA:  Patient with a history of dysphagia. EXAM: ESOPHAGUS/BARIUM SWALLOW/TABLET STUDY TECHNIQUE: Single contrast examination was performed using thin liquid barium. This exam was performed by Alwyn Ren, NP, and was supervised and interpreted by Rad . FLUOROSCOPY: Fluoro time: 1.12 sec COMPARISON:  None Available. FINDINGS: Swallowing: Significant delay in swallow initiation/mechanism. No vestibular penetration or aspiration seen. Pharynx: Unremarkable. Esophagus: Prominent cricopharyngeal bar. Small diverticulum in the lower esophagus. Questionable extrinsic compression along the anterior aspect of the distal esophagus, best seen on image 45 of series 4. Esophageal motility: Advanced dysmotility with spasms. Hiatal Hernia: None. Gastroesophageal reflux: None visualized. Ingested 13 mm barium tablet: Not given. Patient  needs pills crushed. Other: None. IMPRESSION: 1. 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 Further evaluation with endoscopy is recommended. 2. Significant delay in swallow initiation/mechanism. Prominent cricopharyngeal bar. 3. Advanced esophageal dysmotility with spasms. 4. Limited study due to patient's body habitus and mobility limitations. Electronically Signed   By: Acquanetta Belling M.D.   On: 01/14/2023 13:39   US Abdomen Complete  Result Date: 01/09/2023 CLINICAL DATA:  [id number] Gallbladder dilatation [id number] Generalized abdominal pain [id number] EXAM: ABDOMEN ULTRASOUND COMPLETE COMPARISON:  None Available. FINDINGS: Gallbladder: Gallbladder appears distended. Echogenic structure in the gallbladder is consistent with sludge. No shadowing gallstones or wall thickening visualized. No sonographic Murphy sign noted by sonographer. Common bile duct: Diameter: 0.4cm. Liver: No focal lesion identified. Hyperechoic consistent with fatty infiltration. IVC: No abnormality visualized. Pancreas: Obscured by bowel gas. Spleen: Size and appearance within normal limits. Right Kidney: Length: 10.6 cm. Echogenicity increased consistent with chronic medical renal disease. No mass or hydronephrosis visualized. No shadowing stones. Left Kidney: Length: 9.6cm. Echogenicity increased consistent with chronic medical renal disease. Cyst measures 5.5 cm. No follow up recommended based on the ultrasound appearance of the cyst. No solid mass or hydronephrosis visualized. No shadowing stones. Abdominal aorta: Obscured by bowel gas. IMPRESSION: 1. Hepatic fatty infiltration. 2. Gallbladder distended. 3. Minimal gallbladder sludge. 4. Echogenic kidneys consistent with chronic medical renal disease. 5. Left kidney cyst. 6. There was limited visualization, as described. Electronically Signed   By: Layla Maw M.D.   On: 01/09/2023 11:17   CT  ABDOMEN PELVIS WO CONTRAST  Result Date: 01/08/2023 CLINICAL DATA:  Abdominal pain. EXAM: CT ABDOMEN AND PELVIS WITHOUT CONTRAST TECHNIQUE: Multidetector CT  imaging of the abdomen and pelvis was performed following the standard protocol without IV contrast. RADIATION DOSE REDUCTION: This exam was performed according to the departmental dose-optimization program which includes automated exposure control, adjustment of the mA and/or kV according to patient size and/or use of iterative reconstruction technique. COMPARISON:  CT 10/15/2022 and older FINDINGS: Evaluation significant limited by motion despite multiple attempts. As per the CT technologist, the patient had difficulty with the breathing instructions. Lower chest: Lung bases are limited by motion. There is some basilar atelectasis. Coronary artery calcifications are seen. Hepatobiliary: Grossly preserved parenchyma. Gallbladder is dilated. Pancreas: No obvious pancreatic mass. Spleen: Spleen is nonenlarged. Adrenals/Urinary Tract: Nodularity of the adrenal glands, poorly defined with the motion. No renal collecting system dilatation. No obvious obstructing stones. There is some cystic lesions in the left kidney including a larger lower pole focus with some septations and calcifications. Again evaluation is limited but appears similar to previous. Mildly distended urinary bladder. Stomach/Bowel: Surgical changes gastric bypass. The stomach, small and large bowel are nondilated. There are some scattered stool. Details of the bowel are limited. Vascular/Lymphatic: Scattered vascular calcifications. No obvious dilatation of the abdominal aorta. IVC is nondilated. No obvious lymph node enlargement. Reproductive: Status post hysterectomy. No adnexal masses. Other: Markedly limited by motion. No obvious fluid collections. No large amounts of free air. Musculoskeletal: Scattered degenerative changes of the spine and pelvis. Overall moderate to severe. IMPRESSION:  Distended gallbladder. If there is concern of gallbladder pathology ultrasound may be useful as the next step in the workup. Grossly stable left-sided renal cystic foci. Surgical changes of previous gastric bypass Markedly limited examination due to the level of motion. No obvious obstruction, free air or free fluid. In addition, a repeat study could be considered when the patient is more clinically able. Electronically Signed   By: Karen Kays M.D.   On: 01/08/2023 13:41   CT Head Wo Contrast  Result Date: 01/08/2023 CLINICAL DATA:  Altered mental status, weakness for 3 days, confusion EXAM: CT HEAD WITHOUT CONTRAST TECHNIQUE: Contiguous axial images were obtained from the base of the skull through the vertex without intravenous contrast. RADIATION DOSE REDUCTION: This exam was performed according to the departmental dose-optimization program which includes automated exposure control, adjustment of the mA and/or kV according to patient size and/or use of iterative reconstruction technique. COMPARISON:  01/03/2023 FINDINGS: Brain: No evidence of acute infarction, hemorrhage, hydrocephalus, extra-axial collection or mass lesion/mass effect. Mild periventricular white matter hypodensity. Vascular: No hyperdense vessel or unexpected calcification. Skull: Normal. Negative for fracture or focal lesion. Sinuses/Orbits: No acute finding. Other: None. IMPRESSION: No acute intracranial pathology.  Small-vessel white matter disease. Electronically Signed   By: Jearld Lesch M.D.   On: 01/08/2023 13:38   DG Chest Port 1 View  Result Date: 01/08/2023 CLINICAL DATA:  Altered mental status. EXAM: PORTABLE CHEST 1 VIEW COMPARISON:  01/03/2023 FINDINGS: Low lung volumes. Similar streaky density in the retrocardiac medial left base, likely vascular anatomy. No focal consolidation, pulmonary edema, or pleural effusion. Cardiopericardial silhouette is at upper limits of normal for size. No acute bony abnormality. Telemetry  leads overlie the chest. IMPRESSION: No active disease. Electronically Signed   By: Kennith Center M.D.   On: 01/08/2023 12:23    Catarina Hartshorn, DO  [facility]  If 7PM-7AM, please contact night-coverage [email or web address] Password [email or web address] 01/15/2023, 4:30 PM   LOS: 7 days

## 2023-01-15 NOTE — Progress Notes (Signed)
Hypoglycemic Event  CBG: 57 mg/dL  Treatment: G95 25 mL (12.5 gm)  Symptoms: None  Follow-up CBG: Time:0746 CBG Result: 81   Possible Reasons for Event: Inadequate meal intake  Comments/MD notified: Received at report patient has had poor PO intake due to swallowing difficulty. Family at bedside reported the same thing. Order placed to give patient Dextrose 50% solution 12.5 g, given to patient at 0726. Noted patients fluids order had expired. MD Tat made aware.    Jesus Genera

## 2023-01-15 NOTE — Progress Notes (Signed)
Hypoglycemic Event  CBG: 54  Treatment: D50 50 mL (25 gm)  Symptoms: None  Follow-up CBG: Time:1655 CBG Result:120  Possible Reasons for Event: Inadequate meal intake  Comments/MD notified: Patient given D50 50 mL. MD Tat made aware. New orders placed.    Christine [name]

## 2023-01-15 NOTE — Consult Note (Signed)
Consulting  Provider: Dr. Arbutus Leas Primary Care Physician:  [name], DO Primary Gastroenterologist:  Dr. Myrtie Neither Corinda Gubler)  Reason for Consultation: Dysphagia, abnormal esophagram  HPI:  Christine [name] is a 72 y.o. 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  In the ER found to be severely dehydrated with hypernatremia 157, hypoglycemia.  CT head unremarkable.  This has steadily improved, sodium 141 today.  Patient reports feeling of food and pills getting stuck in her esophagus.  Notes pocketing her pills and food.  Nursing staff at skilled nursing facility has spent a lot of time encouraging patient to eat unfortunately has not had much success.  Reports both oral pharyngeal as well as distal esophageal dysphagia.  Underwent esophagram 01/14/2023 which showed advanced esophageal dysmotility with spasms, no obstruction, prominent cricopharyngeal bar.  Did mention distal esophageal diverticulum with possible associated mass causing extrinsic compression of the anterior distal esophagus.  EGD 04/08/2022 without evidence of esophagitis, Barrett's, hiatal hernia, or stenosis of the esophagus.  Did note multiple fundic gland polyps, gastritis, normal duodenum.  Biopsies negative for H. pylori.   Past Medical History:  Diagnosis Date   Allergy    dust, pollen, sulfa, prednisone   Anemia    Anxiety    Arthritis    "knees" (04/26/2016)   Colon polyps    benign per pt   Coronary artery disease    mild per 2015 cath in [place] (OM1 30%, RCA 30%)   GERD (gastroesophageal reflux disease)    Gout    History of hiatal hernia    Hyperlipidemia 05/20/2021   Hypertension    Migraine    "none since early /2017" (05/06/2016)   Obesity    OSA on CPAP    uses CPAP   Pre-diabetes     Pre-operative cardiovascular examination 08/22/2008   Renal insufficiency 11/09/2022   Vasculitis (HCC)    Bilateral    Past Surgical History:  Procedure Laterality Date   ABDOMINAL HYSTERECTOMY     "partial"; both ovaries present   APPENDECTOMY  05/06/2016   BIOPSY  04/08/2022   Procedure: BIOPSY;  Surgeon: Sherrilyn Rist, MD;  Location: [facility];  Service: Gastroenterology;;   CARDIAC CATHETERIZATION  ~ 2015   CARDIAC CATHETERIZATION  2015   In [place]   CHILECTOMY Right 06/01/2017   Procedure: CHILECTOMY RIGHT FOOT;  Surgeon: Felecia Shelling, DPM;  Location: [facility] OR;  Service: Podiatry;  Laterality: Right;   ESOPHAGOGASTRODUODENOSCOPY (EGD) WITH PROPOFOL N/A 04/08/2022   Procedure: ESOPHAGOGASTRODUODENOSCOPY (EGD) WITH PROPOFOL;  Surgeon: Sherrilyn Rist, MD;  Location: [facility];  Service: Gastroenterology;  Laterality: N/A;   LAPAROSCOPIC APPENDECTOMY N/A 05/06/2016   Procedure: LAPAROSCOPIC APPENDECTOMY;  Surgeon: Manus Rudd, MD;  Location: [facility] OR;  Service: General;  Laterality: N/A;   POLYPECTOMY  04/08/2022   Procedure: POLYPECTOMY;  Surgeon: Sherrilyn Rist, MD;  Location: [facility];  Service: Gastroenterology;;   TONSILLECTOMY      Prior to Admission medications   Medication Sig Start Date End Date Taking? Authorizing Provider  acetaminophen (TYLENOL) 325 MG tablet Take 650 mg by mouth every 4 (four) hours as needed for mild pain or moderate pain.   Yes [provider]  amLODipine (NORVASC) 10 MG tablet Take 10 mg by mouth daily.   Yes [provider]  antiseptic oral rinse (BIOTENE) LIQD  15 mLs by Mouth Rinse route in the morning and at bedtime.   Yes [provider]  ascorbic acid (VITAMIN C) 500 MG tablet Take 500 mg by mouth daily.   Yes [provider]  colchicine 0.6 MG tablet Take 1.2 mg by mouth daily.   Yes [provider]  diclofenac Sodium (VOLTAREN) 1 % GEL Apply 2 g topically 4 (four) times  daily.   Yes [provider]  DULoxetine (CYMBALTA) 60 MG capsule Take 1 capsule (60 mg total) by mouth daily. 12/25/21  Yes [name], DO  ferrous sulfate 325 (65 FE) MG EC tablet Take 325 mg by mouth daily with breakfast.   Yes [provider]  folic acid (FOLVITE) 1 MG tablet Take 1 tablet (1 mg total) by mouth daily. 11/16/22  Yes Azucena Fallen, MD  gabapentin (NEURONTIN) 100 MG capsule Take 1 capsule (100 mg total) by mouth 3 (three) times daily. 11/15/22  Yes Azucena Fallen, MD  Multiple Vitamin (MULTIVITAMIN WITH MINERALS) TABS tablet Take 1 tablet by mouth daily. 11/16/22  Yes Azucena Fallen, MD  Nutritional Supplements (PROMOD) LIQD Take 30 mLs by mouth 2 (two) times daily.   Yes [provider]  omeprazole (PRILOSEC) 20 MG capsule Take 20 mg by mouth daily.   Yes [provider]  oxybutynin (DITROPAN XL) 15 MG 24 hr tablet TAKE 1 TABLET BY MOUTH EVERY NIGHT AT BEDTIME 12/30/22  Yes Wendling, Jilda Roche, DO  promethazine (PHENERGAN) 25 MG tablet Take 1 tablet (25 mg total) by mouth every 6 (six) hours as needed for nausea or vomiting. 10/27/22  Yes Wendling, Jilda Roche, DO  senna (SENOKOT) 8.6 MG TABS tablet Take 1 tablet by mouth at bedtime.   Yes [provider]  sodium chloride irrigation 0.9 % irrigation Irrigate with 75 mLs as directed See admin instructions. Use 75 mL/hr intravenously every shift for fluid resuscitation for 2 days   Yes [provider]  traMADol (ULTRAM) 50 MG tablet Take 50 mg by mouth 3 (three) times daily as needed for moderate pain or severe pain. 01/07/23  Yes [provider]  triamterene-hydrochlorothiazide (MAXZIDE-25) 37.5-25 MG tablet Take 1 tablet by mouth daily. 09/28/22  Yes [name], DO  zinc gluconate 50 MG tablet Take 50 mg by mouth daily.   Yes [provider]    Current Facility-Administered Medications  Medication Dose Route Frequency  Provider Last Rate Last Admin   (feeding supplement) PROSource Plus liquid 30 mL  30 mL Oral BID BM Johnson, Clanford L, MD   30 mL at 01/10/23 1752   acetaminophen (TYLENOL) tablet 650 mg  650 mg Oral Q6H PRN Johnson, Clanford L, MD   650 mg at 01/10/23 2236   Or   acetaminophen (TYLENOL) suppository 650 mg  650 mg Rectal Q6H PRN Johnson, Clanford L, MD       amLODipine (NORVASC) tablet 10 mg  10 mg Oral Daily Johnson, Clanford L, MD   10 mg at 01/14/23 0840   antiseptic oral rinse (BIOTENE) solution 15 mL  15 mL Mouth Rinse BID Johnson, Clanford L, MD   15 mL at 01/15/23 0918   cefTRIAXone (ROCEPHIN) 1 g in sodium chloride 0.9 % 100 mL IVPB  1 g Intravenous Q24H Catarina Hartshorn, MD 200 mL/hr at 01/15/23 0921 1 g at 01/15/23 0921   colchicine tablet 0.6 mg  0.6 mg Oral Daily Johnson, Clanford L, MD   0.6 mg at 01/14/23 0840  diclofenac Sodium (VOLTAREN) 1 % topical gel 2 g  2 g Topical QID PRN Johnson, Clanford L, MD       DULoxetine (CYMBALTA) DR capsule 60 mg  60 mg Oral Daily Johnson, Clanford L, MD   60 mg at 01/14/23 0839   enoxaparin (LOVENOX) injection 40 mg  40 mg Subcutaneous Q24H Johnson, Clanford L, MD   40 mg at 01/14/23 2218   feeding supplement (GLUCERNA SHAKE) (GLUCERNA SHAKE) liquid 237 mL  237 mL Oral TID BM Johnson, Clanford L, MD   237 mL at 01/15/23 0911   fentaNYL (SUBLIMAZE) injection 25 mcg  25 mcg Intravenous Q2H PRN Johnson, Clanford L, MD   25 mcg at 01/15/23 0908   ferrous sulfate tablet 325 mg  325 mg Oral Q breakfast Laural Benes, Clanford L, MD   325 mg at 01/14/23 5784   multivitamin with minerals tablet 1 tablet  1 tablet Oral Daily Tat, David, MD   1 tablet at 01/14/23 0840   ondansetron (ZOFRAN) tablet 4 mg  4 mg Oral Q6H PRN Johnson, Clanford L, MD       Or   ondansetron (ZOFRAN) injection 4 mg  4 mg Intravenous Q6H PRN Johnson, Clanford L, MD       Oral care mouth rinse  15 mL Mouth Rinse PRN Johnson, Clanford L, MD       Oral care mouth rinse  15 mL Mouth Rinse PRN  Tat, Onalee Hua, MD       oxybutynin (DITROPAN-XL) 24 hr tablet 15 mg  15 mg Oral QHS Johnson, Clanford L, MD   15 mg at 01/14/23 2218   oxyCODONE (Oxy IR/ROXICODONE) immediate release tablet 5 mg  5 mg Oral Q4H PRN Laural Benes, Clanford L, MD   5 mg at 01/11/23 1323   pantoprazole (PROTONIX) EC tablet 40 mg  40 mg Oral Daily Tat, David, MD   40 mg at 01/14/23 6962   sodium bicarbonate tablet 650 mg  650 mg Oral TID Standley Dakins L, MD   650 mg at 01/14/23 2218   traZODone (DESYREL) tablet 50 mg  50 mg Oral QHS PRN Laural Benes, Clanford L, MD   50 mg at 01/14/23 2218    Allergies as of 01/08/2023 - Review Complete 01/08/2023  Allergen Reaction Noted   Prednisone Swelling 05/13/2016   Tape Hives 11/30/2022   Sulfa antibiotics Rash 05/05/2016    Family History  Problem Relation Age of Onset   Diabetes Mother    Heart disease Mother    High blood pressure Mother    Asthma Mother    Arthritis Mother    Diabetes Father    Heart disease Father    High blood pressure Father    Asthma Father    Diabetes Sister    Alcohol abuse Brother    Drug abuse Brother    Arthritis Sister    Diabetes Sister    Varicose Veins Sister    Colon cancer Neg Hx    Esophageal cancer Neg Hx    Rectal cancer Neg Hx     Social History   Socioeconomic History   Marital status: Single    Spouse name: Not on file   Number of children: 0   Years of education: Not on file   Highest education level: Bachelor's degree (e.g., BA, AB, BS)  Occupational History   Occupation: retired  Tobacco Use   Smoking status: Former    Current packs/day: 0.00    Average packs/day: 0.1 packs/day for 5.0  years (0.6 ttl pk-yrs)    Types: Cigarettes    Start date: 04/26/1973    Quit date: 04/26/1978    Years since quitting: 44.7   Smokeless tobacco: Never  Vaping Use   Vaping status: Never Used  Substance and Sexual Activity   Alcohol use: No   Drug use: No   Sexual activity: Not Currently    Birth control/protection: None   Other Topics Concern   Not on file  Social History Narrative   Right handed    Lives alone with dog    Social Determinants of Health   Financial Resource Strain: Low Risk  (12/01/2022)   Received from [facility]   Overall Financial Resource Strain (CARDIA)    Difficulty of Paying Living Expenses: Not very hard  Food Insecurity: Patient Unable To Answer (01/08/2023)   Hunger Vital Sign    Worried About Running Out of Food in the Last Year: Patient unable to answer    Ran Out of Food in the Last Year: Patient unable to answer  Transportation Needs: Patient Unable To Answer (01/08/2023)   PRAPARE - Transportation    Lack of Transportation (Medical): Patient unable to answer    Lack of Transportation (Non-Medical): Patient unable to answer  Physical Activity: Insufficiently Active (07/13/2022)   Exercise Vital Sign    Days of Exercise per Week: 3 days    Minutes of Exercise per Session: 20 min  Stress: No Stress Concern Present (07/13/2022)   [employer] of Occupational Health - Occupational Stress Questionnaire    Feeling of Stress : Not at all  Social Connections: Moderately Integrated (12/01/2022)   Received from [facility]   Social Connection and Isolation Panel [NHANES]    Frequency of Communication with Friends and Family: Once a week    Frequency of Social Gatherings with Friends and Family: Twice a week    Attends Religious Services: 1 to 4 times per year    Active Member of [employer] or Organizations: No    Attends Engineer, structural: 1 to 4 times per year    Marital Status: Never married  Intimate Partner Violence: Patient Unable To Answer (01/08/2023)   Humiliation, Afraid, Rape, and Kick questionnaire    Fear of Current or Ex-Partner: Patient unable to answer    Emotionally Abused: Patient unable to answer    Physically Abused: Patient unable to answer    Sexually Abused: Patient unable to answer    Review of Systems: General: Negative for  anorexia, weight loss, fever, chills, fatigue, weakness. Eyes: Negative for vision changes.  ENT: Negative for hoarseness, difficulty swallowing , nasal congestion. CV: Negative for chest pain, angina, palpitations, dyspnea on exertion, peripheral edema.  Respiratory: Negative for dyspnea at rest, dyspnea on exertion, cough, sputum, wheezing.  GI: See history of present illness. GU:  Negative for dysuria, hematuria, urinary incontinence, urinary frequency, nocturnal urination.  MS: Negative for joint pain, low back pain.  Derm: Negative for rash or itching.  Neuro: Negative for weakness, abnormal sensation, seizure, frequent headaches, memory loss, confusion.  Psych: Negative for anxiety, depression Endo: Negative for unusual weight change.  Heme: Negative for bruising or bleeding. Allergy: Negative for rash or hives.  Physical Exam: Vital signs in last 24 hours: Temp:  [98 F (36.7 C)-98.2 F (36.8 C)] 98.1 F (36.7 C) (09/21 0500) Pulse Rate:  [84-93] 93 (09/21 0500) Resp:  [18] 18 (09/21 0500) BP: (127-145)/(64-96) 127/64 (09/21 0500) SpO2:  [94 %-100 %] 100 % (  09/21 0500) Weight:  [104.8 kg] 104.8 kg (09/21 0500) Last BM Date : 01/14/23 General:   Alert,  Well-developed, well-nourished, pleasant and cooperative in NAD Head:  Normocephalic and atraumatic. Eyes:  Sclera clear, no icterus.   Conjunctiva pink. Ears:  Normal auditory acuity. Nose:  No deformity, discharge,  or lesions. Mouth:  No deformity or lesions, dentition normal. Neck:  Supple; no masses or thyromegaly. Lungs:  Clear throughout to auscultation.   No wheezes, crackles, or rhonchi. No acute distress. Heart:  Regular rate and rhythm; no murmurs, clicks, rubs,  or gallops. Abdomen:  Soft, nontender and nondistended. No masses, hepatosplenomegaly or hernias noted. Normal bowel sounds, without guarding, and without rebound.   Msk:  Symmetrical without gross deformities. Normal posture. Pulses:  Normal pulses  noted. Extremities:  Without clubbing or edema. Neurologic:  Alert and  oriented x4;  grossly normal neurologically. Skin:  Intact without significant lesions or rashes. Cervical Nodes:  No significant cervical adenopathy. Psych:  Alert and cooperative. Normal mood and affect.  Intake/Output from previous day: 09/20 0701 - 09/21 0700 In: 1098.1 [I.V.:998.1; IV Piggyback:100] Out: 150 [Urine:150] Intake/Output this shift: Total I/O In: 60 [P.O.:60] Out: -   Lab Results: Recent Labs    01/13/23 0453  WBC 5.3  HGB 9.6*  HCT 30.6*  PLT 214   BMET Recent Labs    01/13/23 0453 01/14/23 0432 01/15/23 0439  NA 138 138 141  K 3.6 4.0 3.8  CL 114* 113* 116*  CO2 17* 16* 16*  GLUCOSE 87 90 88  BUN 23 19 14   CREATININE 0.98 0.86 0.85  CALCIUM 8.4* 8.5* 8.7*   LFT No results for input(s): "PROT", "ALBUMIN", "AST", "ALT", "ALKPHOS", "BILITOT", "BILIDIR", "IBILI" in the last 72 hours. PT/INR No results for input(s): "LABPROT", "INR" in the last 72 hours. Hepatitis Panel No results for input(s): "HEPBSAG", "HCVAB", "HEPAIGM", "HEPBIGM" in the last 72 hours. C-Diff No results for input(s): "CDIFFTOX" in the last 72 hours.  Studies/Results: DG ESOPHAGUS W SINGLE CM (SOL OR THIN BA)  Result Date: 01/14/2023 CLINICAL DATA:  Patient with a history of dysphagia. EXAM: ESOPHAGUS/BARIUM SWALLOW/TABLET STUDY TECHNIQUE: Single contrast examination was performed using thin liquid barium. This exam was performed by Alwyn Ren, NP, and was supervised and interpreted by Rad . FLUOROSCOPY: Fluoro time: 1.12 sec COMPARISON:  None Available. FINDINGS: Swallowing: Significant delay in swallow initiation/mechanism. No vestibular penetration or aspiration seen. Pharynx: Unremarkable. Esophagus: Prominent cricopharyngeal bar. Small diverticulum in the lower esophagus. Questionable extrinsic compression along the anterior aspect of the distal esophagus, best seen on image 45 of series 4.  Esophageal motility: Advanced dysmotility with spasms. Hiatal Hernia: None. Gastroesophageal reflux: None visualized. Ingested 13 mm barium tablet: Not given. Patient needs pills crushed. Other: None. IMPRESSION: 1. 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 Further evaluation with endoscopy is recommended. 2. Significant delay in swallow initiation/mechanism. Prominent cricopharyngeal bar. 3. Advanced esophageal dysmotility with spasms. 4. Limited study due to patient's body habitus and mobility limitations. Electronically Signed   By: Acquanetta Belling M.D.   On: 01/14/2023 13:39    Assessment: *Esophageal dysphagia, oropharyngeal dysphagia *Abnormal esophagram, ?extrinsic mass *Hyponatremia/severe dehydration *Adult failure to thrive  Plan: Etiology of patient's symptoms unclear.  Reviewed esophagram with her today.  Possible extrinsic compression from mass.  Will order CT chest with IV contrast to further evaluate.  Relatively recent upper endoscopy 03/2022 unremarkable from esophageal standpoint.  May need to repeat  pending CT findings.  May benefit from outpatient esophageal manometry given spasms noted on esophagram.  Agree with pantoprazole daily.  Patient should sit upright during meals 90 degrees.  Stay upright for 1 hour after meals.  Otherwise supportive care.  Hennie Duos. Marletta Lor, D.O. Gastroenterology and Hepatology [facility]    LOS: 7 days     01/15/2023, 1:25 PM

## 2023-01-16 ENCOUNTER — Other Ambulatory Visit: Payer: Self-pay

## 2023-01-16 DIAGNOSIS — N179 Acute kidney failure, unspecified: Secondary | ICD-10-CM | POA: Diagnosis not present

## 2023-01-16 DIAGNOSIS — R627 Adult failure to thrive: Secondary | ICD-10-CM | POA: Diagnosis not present

## 2023-01-16 DIAGNOSIS — E870 Hyperosmolality and hypernatremia: Secondary | ICD-10-CM | POA: Diagnosis not present

## 2023-01-16 DIAGNOSIS — R109 Unspecified abdominal pain: Secondary | ICD-10-CM | POA: Diagnosis not present

## 2023-01-16 DIAGNOSIS — R1319 Other dysphagia: Secondary | ICD-10-CM | POA: Diagnosis not present

## 2023-01-16 LAB — BASIC METABOLIC PANEL
Anion gap: 8 (ref 5–15)
BUN: 13 mg/dL (ref 8–23)
CO2: 15 mmol/L — ABNORMAL LOW (ref 22–32)
Calcium: 8.3 mg/dL — ABNORMAL LOW (ref 8.9–10.3)
Chloride: 117 mmol/L — ABNORMAL HIGH (ref 98–111)
Creatinine, Ser: 0.99 mg/dL (ref 0.44–1.00)
GFR, Estimated: >60 mL/min (ref >60)
Glucose, Bld: 106 mg/dL — ABNORMAL HIGH (ref 70–99)
Potassium: 3.4 mmol/L — ABNORMAL LOW (ref 3.5–5.1)
Sodium: 140 mmol/L (ref 135–145)

## 2023-01-16 LAB — CBC
HCT: 32.6 % — ABNORMAL LOW (ref 36.0–46.0)
Hemoglobin: 9.9 g/dL — ABNORMAL LOW (ref 12.0–15.0)
MCH: 26 pg (ref 26.0–34.0)
MCHC: 30.4 g/dL (ref 30.0–36.0)
MCV: 85.6 fL (ref 80.0–100.0)
Platelets: 262 10*3/uL (ref 150–400)
RBC: 3.81 MIL/uL — ABNORMAL LOW (ref 3.87–5.11)
RDW: 22.4 % — ABNORMAL HIGH (ref 11.5–15.5)
WBC: 10.8 10*3/uL — ABNORMAL HIGH (ref 4.0–10.5)
nRBC: 0 % (ref 0.0–0.2)

## 2023-01-16 LAB — URINE CULTURE: Culture: >=100000 — AB

## 2023-01-16 LAB — GLUCOSE, CAPILLARY
Glucose-Capillary: 110 mg/dL — ABNORMAL HIGH (ref 70–99)
Glucose-Capillary: 101 mg/dL — ABNORMAL HIGH (ref 70–99)
Glucose-Capillary: 89 mg/dL (ref 70–99)
Glucose-Capillary: 91 mg/dL (ref 70–99)
Glucose-Capillary: 104 mg/dL — ABNORMAL HIGH (ref 70–99)

## 2023-01-16 LAB — PROCALCITONIN: Procalcitonin: 0.17 ng/mL

## 2023-01-16 LAB — LACTIC ACID, PLASMA
Lactic Acid, Venous: 1.1 mmol/L (ref 0.5–1.9)
Lactic Acid, Venous: 2.1 mmol/L (ref 0.5–1.9)

## 2023-01-16 MED ORDER — LACTATED RINGERS IV BOLUS
1000.0000 mL | Freq: Once | INTRAVENOUS | Status: AC
  Administered 2023-01-16: 1000 mL via INTRAVENOUS

## 2023-01-16 MED ORDER — SODIUM CHLORIDE 0.9% FLUSH
10.0000 mL | Freq: Two times a day (BID) | INTRAVENOUS | Status: DC
  Administered 2023-01-16: 10 mL
  Administered 2023-01-16: 20 mL
  Administered 2023-01-17 – 2023-02-01 (×29): 10 mL
  Administered 2023-02-01: 20 mL
  Administered 2023-02-02 – 2023-02-03 (×4): 10 mL
  Administered 2023-02-04: 20 mL

## 2023-01-16 MED ORDER — PANTOPRAZOLE SODIUM 40 MG IV SOLR
40.0000 mg | Freq: Two times a day (BID) | INTRAVENOUS | Status: DC
  Administered 2023-01-16 – 2023-02-04 (×40): 40 mg via INTRAVENOUS
  Filled 2023-01-16 (×39): qty 10

## 2023-01-16 MED ORDER — SODIUM CHLORIDE 0.9 % IV SOLN: 1.0000 mg | Freq: Every day | INTRAVENOUS | Status: DC

## 2023-01-16 MED ORDER — SODIUM BICARBONATE 8.4 % IV SOLN
INTRAVENOUS | Status: AC
  Filled 2023-01-16 (×4): qty 1000

## 2023-01-16 MED ORDER — FENTANYL CITRATE PF 50 MCG/ML IJ SOSY
12.5000 ug | PREFILLED_SYRINGE | Freq: Once | INTRAMUSCULAR | Status: DC
Start: 2023-01-16 — End: 2023-01-16

## 2023-01-16 MED ORDER — DIAZEPAM 5 MG/ML IJ SOLN
2.5000 mg | Freq: Once | INTRAMUSCULAR | Status: AC
  Administered 2023-01-16: 2.5 mg via INTRAVENOUS
  Filled 2023-01-16: qty 2

## 2023-01-16 MED ORDER — POTASSIUM CHLORIDE 10 MEQ/100ML IV SOLN
10.0000 meq | INTRAVENOUS | Status: AC
  Administered 2023-01-16 (×3): 10 meq via INTRAVENOUS
  Filled 2023-01-16 (×3): qty 100

## 2023-01-16 MED ORDER — PIPERACILLIN-TAZOBACTAM 3.375 G IVPB
3.3750 g | Freq: Three times a day (TID) | INTRAVENOUS | Status: DC
  Administered 2023-01-16 – 2023-01-17 (×4): 3.375 g via INTRAVENOUS
  Filled 2023-01-16 (×5): qty 50

## 2023-01-16 MED ORDER — KETOROLAC TROMETHAMINE 15 MG/ML IJ SOLN
15.0000 mg | Freq: Once | INTRAMUSCULAR | Status: AC
  Administered 2023-01-16: 15 mg via INTRAVENOUS
  Filled 2023-01-16: qty 1

## 2023-01-16 MED ORDER — CHLORHEXIDINE GLUCONATE CLOTH 2 % EX PADS
6.0000 | MEDICATED_PAD | Freq: Every day | CUTANEOUS | Status: DC
  Administered 2023-01-16 – 2023-02-04 (×19): 6 via TOPICAL

## 2023-01-16 MED ORDER — SODIUM CHLORIDE 0.9% FLUSH
10.0000 mL | INTRAVENOUS | Status: DC | PRN
  Administered 2023-01-26: 10 mL

## 2023-01-16 MED ORDER — POTASSIUM CHLORIDE CRYS ER 20 MEQ PO TBCR
40.0000 meq | EXTENDED_RELEASE_TABLET | Freq: Once | ORAL | Status: DC
Start: 2023-01-16 — End: 2023-01-16

## 2023-01-16 MED ORDER — FOLIC ACID 5 MG/ML IJ SOLN
1.0000 mg | Freq: Every day | INTRAMUSCULAR | Status: DC
  Administered 2023-01-16 – 2023-01-26 (×9): 1 mg via INTRAVENOUS
  Filled 2023-01-16 (×12): qty 0.2

## 2023-01-16 NOTE — Plan of Care (Signed)
  Problem: Clinical Measurements: Goal: Will show no signs or symptoms of venous thromboembolism Outcome: Progressing   Problem: Respiratory: Goal: Will regain and/or maintain adequate ventilation Outcome: Progressing   Problem: Clinical Measurements: Goal: Ability to maintain clinical measurements within normal limits will improve Outcome: Progressing Goal: Respiratory complications will improve Outcome: Progressing Goal: Cardiovascular complication will be avoided Outcome: Progressing   Problem: Elimination: Goal: Will not experience complications related to bowel motility Outcome: Progressing Goal: Will not experience complications related to urinary retention Outcome: Progressing

## 2023-01-16 NOTE — Progress Notes (Signed)
Subjective: Patient's somnolent on my exam.  Discussed with RN, she has been refusing anything by mouth stating that it causes abdominal pain.  PICC line being placed shortly.  Fever overnight.  Objective: Vital signs in last 24 hours: Temp:  [98.3 F (36.8 C)-100.5 F (38.1 C)] 100.1 F (37.8 C) (09/22 0834) Pulse Rate:  [111-120] 111 (09/22 0834) Resp:  [18-20] 20 (09/22 0834) BP: (112-130)/(68-87) 118/69 (09/22 0834) SpO2:  [99 %-100 %] 100 % (09/22 0834) FiO2 (%):  [21 %] 21 % (09/21 2245) Weight:  [103.7 kg] 103.7 kg (09/22 0300) Last BM Date : 01/16/23 General:   Alert and oriented, pleasant Head:  Normocephalic and atraumatic. Eyes:  No icterus, sclera clear. Conjuctiva pink.  Abdomen:  Bowel sounds present, soft, non-tender, non-distended. No HSM or hernias noted. No rebound or guarding. No masses appreciated  Msk:  Symmetrical without gross deformities. Normal posture. Extremities:  Without clubbing or edema. Neurologic:  Alert and  oriented x4;  grossly normal neurologically. Skin:  Warm and dry, intact without significant lesions.  Cervical Nodes:  No significant cervical adenopathy. Psych:  Alert and cooperative. Normal mood and affect.  Intake/Output from previous day: 09/21 0701 - 09/22 0700 In: 280.7 [P.O.:180; I.V.:0.5; IV Piggyback:100.2] Out: -  Intake/Output this shift: No intake/output data recorded.  Lab Results: Recent Labs    01/16/23 0428  WBC 10.8*  HGB 9.9*  HCT 32.6*  PLT 262   BMET Recent Labs    01/14/23 0432 01/15/23 0439 01/16/23 0428  NA 138 141 140  K 4.0 3.8 3.4*  CL 113* 116* 117*  CO2 16* 16* 15*  GLUCOSE 90 88 106*  BUN 19 14 13   CREATININE 0.86 0.85 0.99  CALCIUM 8.5* 8.7* 8.3*   LFT No results for input(s): "PROT", "ALBUMIN", "AST", "ALT", "ALKPHOS", "BILITOT", "BILIDIR", "IBILI" in the last 72 hours. PT/INR No results for input(s): "LABPROT", "INR" in the last 72 hours. Hepatitis Panel No results for input(s):  "HEPBSAG", "HCVAB", "HEPAIGM", "HEPBIGM" in the last 72 hours.   Studies/Results: [place] EKG SITE RITE  Result Date: 01/16/2023 If [facility] image not attached, placement could not be confirmed due to current cardiac rhythm.  CT CHEST W CONTRAST  Result Date: 01/15/2023 CLINICAL DATA:  Questioning esophageal mass. EXAM: CT CHEST WITH CONTRAST TECHNIQUE: Multidetector CT imaging of the chest was performed during intravenous contrast administration. RADIATION DOSE REDUCTION: This exam was performed according to the departmental dose-optimization program which includes automated exposure control, adjustment of the mA and/or kV according to patient size and/or use of iterative reconstruction technique. CONTRAST:  75mL OMNIPAQUE IOHEXOL 300 MG/ML  SOLN COMPARISON:  Esophagram 01/14/2023 FINDINGS: Cardiovascular: No significant vascular findings. Normal heart size. No pericardial effusion. Mediastinum/Nodes: No enlarged mediastinal, hilar, or axillary lymph nodes. Thyroid gland, trachea, and esophagus demonstrate no significant findings. Lungs/Pleura: There is minimal atelectasis in the left lower lobe. The lungs are otherwise clear. There is no pleural effusion or pneumothorax. Upper Abdomen: No acute findings. Rounded 2 point 0 cm left renal hypodensity likely represents a cyst or complex cyst. There are postsurgical changes in the stomach. Musculoskeletal: No chest wall abnormality. No acute or significant osseous findings. IMPRESSION: 1. No acute cardiopulmonary process. 2. Minimal atelectasis in the left lower lobe. Electronically Signed   By: Darliss Cheney M.D.   On: 01/15/2023 22:29    Assessment: *Esophageal dysphagia, oropharyngeal dysphagia *Esophageal dysmotility, esophageal spasms *Hyponatremia/severe dehydration *Adult failure to thrive   Plan: Patient with significant esophageal dysmotility, esophageal spasms.Marland Kitchen  Relatively recent upper endoscopy 03/2022 unremarkable from esophageal  standpoint.  May need to repeat pending CT findings.  CT chest with contrast unremarkable for extrinsic esophageal mass.   May benefit from outpatient esophageal manometry given spasms noted on esophagram.   Agree with pantoprazole daily.  Has been evaluated by speech therapy.   Patient should sit upright during meals 90 degrees.  Stay upright for 1 hour after meals.   Per RN, patient complaining of abdominal pain related to anything by mouth.  Fever and steadily increasing white count.  Recommend ultrasound to evaluate for cholecystitis.   Hennie Duos. Marletta Lor, D.O. Gastroenterology and Hepatology [facility]   LOS: 8 days    01/16/2023, 2:57 PM

## 2023-01-16 NOTE — Progress Notes (Addendum)
CRITICAL VALUE STICKER  CRITICAL VALUE: Lactic Acid 2.1  RECEIVER (on-site recipient of call): Marvell Fuller, RN  DATE & TIME NOTIFIED: 01/16/2023 at 1044  MESSENGER (representative from lab):Alphonzo Lemmings   MD NOTIFIED: Dr. Arbutus Leas  TIME OF NOTIFICATION: 1045  RESPONSE: see new orderse

## 2023-01-16 NOTE — Progress Notes (Signed)
Peripherally Inserted Central Catheter Placement  The IV Nurse has discussed with the patient and/or persons authorized to consent for the patient, the purpose of this procedure and the potential benefits and risks involved with this procedure.  The benefits include less needle sticks, lab draws from the catheter, and the patient may be discharged home with the catheter. Risks include, but not limited to, infection, bleeding, blood clot (thrombus formation), and puncture of an artery; nerve damage and irregular heartbeat and possibility to perform a PICC exchange if needed/ordered by physician.  Alternatives to this procedure were also discussed.  Bard Power PICC patient education guide, fact sheet on infection prevention and patient information card has been provided to patient /or left at bedside.   Telephone consent obtained from sister. PICC Placement Documentation  PICC Double Lumen 01/16/23 Right Basilic 37 cm 0 cm (Active)  Indication for Insertion or Continuance of Line Limited venous access - need for IV therapy >5 days (PICC only) 01/16/23 1423  Exposed Catheter (cm) 0 cm 01/16/23 1423  Site Assessment Clean, Dry, Intact 01/16/23 1423  Lumen #1 Status Flushed;Saline locked;Blood return noted 01/16/23 1423  Lumen #2 Status Flushed;Saline locked;Blood return noted 01/16/23 1423  Dressing Type Transparent;Securing device 01/16/23 1423  Dressing Status Antimicrobial disc in place;Clean, Dry, Intact 01/16/23 1423  Line Care Connections checked and tightened 01/16/23 1423  Line Adjustment (NICU/IV Team Only) No 01/16/23 1423  Dressing Intervention New dressing 01/16/23 1423  Dressing Change Due 01/23/23 01/16/23 1423       Elliot Dally 01/16/2023, 2:24 PM

## 2023-01-16 NOTE — Progress Notes (Signed)
While cleaning up patient, patient started screaming and cursing at staff to stop. Patient educated on importance of being clean especially due to wounds already present. Patient became violent and started swinging on staff. Dr. Thomes Dinning made aware of patients behaviors.

## 2023-01-16 NOTE — Progress Notes (Signed)
Patient continues to refuse oral intake. Wound care completed per order. PRN pain medications given x1 during this shift.

## 2023-01-16 NOTE — Progress Notes (Signed)
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 Dossie Der.  She has been progressively declining since surgery.  She has had multiple hospitalizations and ED visits in past several months.  Recently seen at [facility] on 01/03/23 with complaints of weakness and poor oral intake.  She was hospitalized in July and August of this year at different facilities for dehydration, weakness, acute renal failure and failure to thrive.  She has been residing in a SNF.  She apparently has not been eating or drinking and refusing to take her medications.  She was sent to ER today for evaluation of altered mentation.  She has had 3 days of progressive weakness and refusing oral intake.  She complains of pain all over body which is not new per record review. Pt is a poor historian.   She was found to be severely dehydrated with hypoglycemia and hypernatremic with a sodium of 157.  She also had acute kidney injury and distended gallbladder on CT scan.  CT head with no acute findings.  Pt was started on IV hydration and admission was requested.     The patient was started on IV hypotonic fluid with D5W with improvement of her electrolytes.  Her hypoglycemia improved on D5W.  Her electrolytes were optimized.  Her renal function gradually improved.  In addition, the patient's mental status improved and essentially returned to baseline.  Her hospitalization has been prolonged secondary to her deconditioning and difficulty finding appropriate placement.  In addition, the patient continues to have failure to thrive.  She continues to have very poor oral intake.  On numerous occasions, the patient with " pocket" pills and food in her mouth and not swallow.  She continued to  show delayed swallowing.  Speech therapy was consulted and did not feel that there was a pharyngeal etiology of the patient's swallowing.  Numerous discussions were held with the patient and family regarding the patient's lack of desire to eat and swallow her nutrition and pills.  Each day, nursing staff it has been an inordinate amount of time encouraging the patient.  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  GI was consulted to assist   Assessment/Plan: Hypernatremia / Severe Dehydration  - pt presented with severe clinical findings of free water deficit  - continue D5W infusion to give free water - monitor sodium levels daily >>improved - change IVF to D51/2NS with KCl   Hypoglycemia/Failure to Thrive - from poor oral intake and recent gastric bypass  - monitor CBG regularly - continue dextrose infusion >>rate decreased  -pt encouraged innumerable occasions to take po -D51/2NS turned off x 24 hours and pt has recurrent hypoglycemia -am cortisol   Adult Failure to Thrive/Esophageal dysmotility -Patient continues to have poor oral intake and no desire to eat -She continues to " pocket" her pills and food. -Nursing staff has spent an unwarranted amount of time each day encouraging the patient - pt's sister traveled by train from [place] to come here to visit and help with arrangements for post hospital care -9/20 Esophagram--advanced esophageal dysmotility but no obstruction.  It also showed Distal esophageal diverticulum  with possible associated mass causing extrinsic compression of the anterior distal esophageal -appreciate GI consult>>CT chest--LLL atelectasis, no mass, lungs clear  UTI -9/20 UA>50 WBC -9/20 urine culture = MDR Ecoli -d/c ceftriaxone -start zosyn  Abdominal pain -9/14 CT abd--limited by  motion, but  No obvious obstruction, free air or free fluid; s/p gastric bypass -9/15 Abd US--GB distended, minimal GB sludge -check LFTs -HIDA scan -start protonix  NAGMA -start bicarb drip   Hypokalemia/Hypomagnesemia - IV replacement ordered -add KCl to IVF   AKI  -baseline creatinine 0.6-1.0 -serum creatinine peaked 1.66 - prerenal from severe dehydration  - continue IV fluid hydration as ordered  - renal function improved--back to baseline   Acute metabolic encephalopathy/Failure to Thrive - secondary to severe hypernatremia, AKI - mental status now back to baseline -B12--1183 -folate 11.5 -TSH 1.725 -ammonia = 18 -consult speech therapy--no evidence of oropharygeal dysphagia -recheck UA >50 WBC -MR brain   NAG Metabolic acidosis - added sodium bicarb tablets 9/16   OSA  - nightly CPAP ordered    Essential hypertension  - initially held home antihypertensives until she has been better rehydrated  - pt had been refusing all of her meds for past several days  - BPs now rebounding after hydration - restarted amlodipine 10 mg daily on 9/16     Generalized weakness  - secondary to acute metabolic derangements discussed above - PT eval requested 9/17    Morbid Obesity  - pt is postop s/p 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 Dossie Der. - her preop weight was 294.8 # and her 2nd postop visit she weighed 261.6# - current weight 226.2 #  - continue vitamin supplements    GERD - IV pantoprazole ordered for GI protection until she can take oral again   Sacral pressure wounds - noted below, appreciate wound care nursing team consultation     Goals of care -discussed with sister, brother, patient>>pt does not want gastrostomy tube -pt understands that there is nothing medically prohibitive affecting her swallowing and that she is able to eat -she acknowledges that she chooses not to chew and swallow -consult palliative medicine -discussed  other options including duotube/dobhoff vs TPN        Family Communication: sister and brother updated at bedside 9/22   Consultants:  none   Code Status:  FULL   DVT Prophylaxis:   [place] Lovenox     Procedures: As Listed in Progress Note Above   Antibiotics: Ceftriaxone 9/20>>           Total time spent 50 minutes.  Greater than 50% spent face to face counseling and coordinating care.        Subjective: Pt denies cp,sob, heachache.  Denies f/c, n/v  Objective: Vitals:   01/16/23 0202 01/16/23 0300 01/16/23 0510 01/16/23 0834  BP:  130/87 116/76 118/69  Pulse:  (!) 117 (!) 120 (!) 111  Resp:  18 18 20   Temp: (!) 100.5 F (38.1 C) (!) 100.5 F (38.1 C) 98.3 F (36.8 C) 100.1 F (37.8 C)  TempSrc: Axillary Axillary Oral Oral  SpO2:  100% 99% 100%  Weight:  103.7 kg    Height:        Intake/Output Summary (Last 24 hours) at 01/16/2023 1325 Last data filed at 01/15/2023 2053 Gross per 24 hour  Intake 220.7 ml  Output --  Net 220.7 ml   Weight change: -1.1 kg Exam:  General:  Pt is alert, follows commands appropriately, not in acute  distress HEENT: No icterus, No thrush, No neck mass, [place]/AT Cardiovascular: RRR, S1/S2, no rubs, no gallops Respiratory: CTA bilaterally, no wheezing, no crackles, no rhonchi Abdomen: Soft/+BS, diffusely tender, non distended, no guarding Extremities: 1 + LE edema, No lymphangitis, No petechiae, No rashes, no synovitis   Data Reviewed: I have personally reviewed following labs and imaging studies Basic Metabolic Panel: Recent Labs  Lab 01/10/23 0405 01/11/23 0518 01/12/23 0444 01/13/23 0453 01/14/23 0432 01/15/23 0439 01/16/23 0428  NA 151*   < > 139 138 138 141 140  K 3.5   < > 2.7* 3.6 4.0 3.8 3.4*  CL 122*   < > 112* 114* 113* 116* 117*  CO2 17*   < > 17* 17* 16* 16* 15*  GLUCOSE 74   < > 84 87 90 88 106*  BUN 48*   < > 31* 23 19 14 13   CREATININE 1.24*   < > 1.11* 0.98 0.86 0.85 0.99  CALCIUM 8.9   < >  8.3* 8.4* 8.5* 8.7* 8.3*  MG 2.0  --  1.5* 2.0  --   --   --    < > = values in this interval not displayed.   Liver Function Tests: Recent Labs  Lab 01/10/23 0405  AST 27  ALT 24  ALKPHOS 194*  BILITOT 0.7  PROT 6.3*  ALBUMIN 2.8*   No results for input(s): "LIPASE", "AMYLASE" in the last 168 hours. Recent Labs  Lab 01/13/23 0453  AMMONIA 18   Coagulation Profile: No results for input(s): "INR", "PROTIME" in the last 168 hours. CBC: Recent Labs  Lab 01/10/23 0549 01/11/23 0518 01/13/23 0453 01/16/23 0428  WBC 7.1 7.0 5.3 10.8*  HGB 9.8* 10.1* 9.6* 9.9*  HCT 31.8* 33.6* 30.6* 32.6*  MCV 88.1 88.2 85.0 85.6  PLT 212 222 214 262   Cardiac Enzymes: No results for input(s): "CKTOTAL", "CKMB", "CKMBINDEX", "TROPONINI" in the last 168 hours. BNP: Invalid input(s): "POCBNP" CBG: Recent Labs  Lab 01/15/23 2149 01/15/23 2326 01/16/23 0517 01/16/23 0736 01/16/23 1215  GLUCAP 64* 117* 110* 101* 89   HbA1C: No results for input(s): "HGBA1C" in the last 72 hours. Urine analysis:    Component Value Date/Time   COLORURINE AMBER (A) 01/14/2023 0630   APPEARANCEUR CLOUDY (A) 01/14/2023 0630   LABSPEC 1.012 01/14/2023 0630   PHURINE 5.0 01/14/2023 0630   GLUCOSEU NEGATIVE 01/14/2023 0630   HGBUR MODERATE (A) 01/14/2023 0630   BILIRUBINUR NEGATIVE 01/14/2023 0630   KETONESUR NEGATIVE 01/14/2023 0630   PROTEINUR 30 (A) 01/14/2023 0630   NITRITE POSITIVE (A) 01/14/2023 0630   LEUKOCYTESUR LARGE (A) 01/14/2023 0630   Sepsis Labs: @LABRCNTIP (procalcitonin:4,lacticidven:4) ) Recent Results (from the past 240 hour(s))  SARS Coronavirus 2 by RT PCR (hospital order, performed in [facility] hospital lab) *cepheid single result test* Anterior Nasal Swab     Status: None   Collection Time: 01/08/23 12:18 PM   Specimen: Anterior Nasal Swab  Result Value Ref Range Status   SARS Coronavirus 2 by RT PCR NEGATIVE NEGATIVE Final    Comment: (NOTE) SARS-CoV-2 target nucleic  acids are NOT DETECTED.  The SARS-CoV-2 RNA is generally detectable in upper and lower respiratory specimens during the acute phase of infection. The lowest concentration of SARS-CoV-2 viral copies this assay can detect is 250 copies / mL. A negative result does not preclude SARS-CoV-2 infection and should not be used as the sole basis for treatment or other patient management decisions.  A negative result may occur  with improper specimen collection / handling, submission of specimen other than nasopharyngeal swab, presence of viral mutation(s) within the areas targeted by this assay, and inadequate number of viral copies (<250 copies / mL). A negative result must be combined with clinical observations, patient history, and epidemiological information.  Fact Sheet for Patients:   RoadLapTop.co.za  Fact Sheet for Healthcare Providers: http://kim-miller.com/  This test is not yet approved or  cleared by the [place] FDA and has been authorized for detection and/or diagnosis of SARS-CoV-2 by FDA under an Emergency Use Authorization (EUA).  This EUA will remain in effect (meaning this test can be used) for the duration of the COVID-19 declaration under Section 564(b)(1) of the Act, 21 U.S.C. section 360bbb-3(b)(1), unless the authorization is terminated or revoked sooner.  Performed at [facility], [address] [place], [place] [address]   MRSA Next Gen by PCR, Nasal     Status: None   Collection Time: 01/08/23  5:50 PM   Specimen: Nasal Mucosa; Nasal Swab  Result Value Ref Range Status   MRSA by PCR Next Gen NOT DETECTED NOT DETECTED Final    Comment: (NOTE) The GeneXpert MRSA Assay (FDA approved for NASAL specimens only), is one component of a comprehensive MRSA colonization surveillance program. It is not intended to diagnose MRSA infection nor to guide or monitor treatment for MRSA infections. Test performance is not FDA  approved in patients less than 65 years old. Performed at [facility], [address], [place], [place] [address]   Gastrointestinal Panel by PCR , Stool     Status: None   Collection Time: 01/08/23  6:11 PM   Specimen: Stool  Result Value Ref Range Status   Campylobacter species NOT DETECTED NOT DETECTED Final   Plesimonas shigelloides NOT DETECTED NOT DETECTED Final   Salmonella species NOT DETECTED NOT DETECTED Final   Yersinia enterocolitica NOT DETECTED NOT DETECTED Final   Vibrio species NOT DETECTED NOT DETECTED Final   Vibrio cholerae NOT DETECTED NOT DETECTED Final   Enteroaggregative E coli (EAEC) NOT DETECTED NOT DETECTED Final   Enteropathogenic E coli (EPEC) NOT DETECTED NOT DETECTED Final   Enterotoxigenic E coli (ETEC) NOT DETECTED NOT DETECTED Final   Shiga like toxin producing E coli (STEC) NOT DETECTED NOT DETECTED Final   Shigella/Enteroinvasive E coli (EIEC) NOT DETECTED NOT DETECTED Final   Cryptosporidium NOT DETECTED NOT DETECTED Final   Cyclospora cayetanensis NOT DETECTED NOT DETECTED Final   Entamoeba histolytica NOT DETECTED NOT DETECTED Final   Giardia lamblia NOT DETECTED NOT DETECTED Final   Adenovirus F40/41 NOT DETECTED NOT DETECTED Final   Astrovirus NOT DETECTED NOT DETECTED Final   Norovirus GI/GII NOT DETECTED NOT DETECTED Final   Rotavirus A NOT DETECTED NOT DETECTED Final   Sapovirus (I, II, IV, and V) NOT DETECTED NOT DETECTED Final    Comment: Performed at [facility], [address] [place], [place] [address]  C Difficile Quick Screen w PCR reflex     Status: None   Collection Time: 01/08/23  6:15 PM   Specimen: Stool  Result Value Ref Range Status   C Diff antigen NEGATIVE NEGATIVE Final   C Diff toxin NEGATIVE NEGATIVE Final   C Diff interpretation No C. difficile detected.  Final    Comment: Performed at [facility], [address], [place], [place] [address]  Urine Culture     Status: Abnormal   Collection Time: 01/14/23   6:30 AM   Specimen: Urine, Random  Result Value Ref Range Status   Specimen Description   Final    URINE, RANDOM Performed at [facility], [address], [place], [place] [address]    Special Requests   Final    NONE Reflexed from [id number] Performed at [facility], [address], [place], [place] [address]    Culture (A)  Final    >=100,000 COLONIES/mL ESCHERICHIA COLI Confirmed Extended Spectrum Beta-Lactamase Producer (ESBL).  In bloodstream infections from ESBL organisms, carbapenems are preferred over piperacillin/tazobactam. They are shown to have a lower risk of mortality.    Report Status 01/16/2023 FINAL  Final   Organism ID, Bacteria ESCHERICHIA COLI (A)  Final      Susceptibility   Escherichia coli - MIC*    AMPICILLIN >=32 RESISTANT Resistant     CEFAZOLIN >=64 RESISTANT Resistant     CEFEPIME >=32 RESISTANT Resistant     CEFTRIAXONE >=64 RESISTANT Resistant     CIPROFLOXACIN >=4 RESISTANT Resistant     GENTAMICIN >=16 RESISTANT Resistant     IMIPENEM <=0.25 SENSITIVE Sensitive     NITROFURANTOIN <=16 SENSITIVE Sensitive     TRIMETH/SULFA >=320 RESISTANT Resistant     AMPICILLIN/SULBACTAM 16 INTERMEDIATE Intermediate     PIP/TAZO <=4 SENSITIVE Sensitive     * >=100,000 COLONIES/mL ESCHERICHIA COLI  Culture, blood (Routine X 2) w Reflex to ID Panel     Status: None (Preliminary result)   Collection Time: 01/16/23  8:03 AM   Specimen: Right Antecubital; Blood  Result Value Ref Range Status   Specimen Description   Final    RIGHT ANTECUBITAL BOTTLES DRAWN AEROBIC AND ANAEROBIC   Special Requests   Final    Blood Culture results may not be optimal due to an excessive volume of blood received in culture bottles Performed at [facility], [address], [place], [place] [address]    Culture PENDING  Incomplete   Report Status PENDING  Incomplete  Culture, blood (Routine X 2) w Reflex to ID Panel     Status: None (Preliminary result)   Collection Time: 01/16/23  8:03  AM   Specimen: BLOOD RIGHT HAND  Result Value Ref Range Status   Specimen Description   Final    BLOOD RIGHT HAND BOTTLES DRAWN AEROBIC AND ANAEROBIC   Special Requests   Final    Blood Culture results may not be optimal due to an excessive volume of blood received in culture bottles Performed at [facility], [address], [place], [place] [address]    Culture PENDING  Incomplete   Report Status PENDING  Incomplete     Scheduled Meds:  (feeding supplement) PROSource Plus  30 mL Oral BID BM   amLODipine  10 mg Oral Daily   antiseptic oral rinse  15 mL Mouth Rinse BID   colchicine  0.6 mg Oral Daily   diazepam  2.5 mg Intravenous Once   DULoxetine  60 mg Oral Daily   enoxaparin (LOVENOX) injection  40 mg Subcutaneous Q24H   feeding supplement (GLUCERNA SHAKE)  237 mL Oral TID BM   ferrous sulfate  325 mg Oral Q breakfast   multivitamin with minerals  1 tablet Oral Daily   oxybutynin  15 mg Oral QHS   pantoprazole  40 mg Oral Daily   sodium bicarbonate  650 mg Oral TID   Continuous Infusions:  dextrose 5 % and 0.45 % NaCl with KCl 20 mEq/L 50 mL/hr at 01/16/23 1159   piperacillin-tazobactam (ZOSYN)  IV  Procedures/Studies: [place] EKG SITE RITE  Result Date: 01/16/2023 If [facility] image not attached, placement could not be confirmed due to current cardiac rhythm.  CT CHEST W CONTRAST  Result Date: 01/15/2023 CLINICAL DATA:  Questioning esophageal mass. EXAM: CT CHEST WITH CONTRAST TECHNIQUE: Multidetector CT imaging of the chest was performed during intravenous contrast administration. RADIATION DOSE REDUCTION: This exam was performed according to the departmental dose-optimization program which includes automated exposure control, adjustment of the mA and/or kV according to patient size and/or use of iterative reconstruction technique. CONTRAST:  75mL OMNIPAQUE IOHEXOL 300 MG/ML  SOLN COMPARISON:  Esophagram 01/14/2023 FINDINGS: Cardiovascular: No significant vascular  findings. Normal heart size. No pericardial effusion. Mediastinum/Nodes: No enlarged mediastinal, hilar, or axillary lymph nodes. Thyroid gland, trachea, and esophagus demonstrate no significant findings. Lungs/Pleura: There is minimal atelectasis in the left lower lobe. The lungs are otherwise clear. There is no pleural effusion or pneumothorax. Upper Abdomen: No acute findings. Rounded 2 point 0 cm left renal hypodensity likely represents a cyst or complex cyst. There are postsurgical changes in the stomach. Musculoskeletal: No chest wall abnormality. No acute or significant osseous findings. IMPRESSION: 1. No acute cardiopulmonary process. 2. Minimal atelectasis in the left lower lobe. Electronically Signed   By: Darliss Cheney M.D.   On: 01/15/2023 22:29   DG ESOPHAGUS W SINGLE CM (SOL OR THIN BA)  Result Date: 01/14/2023 CLINICAL DATA:  Patient with a history of dysphagia. EXAM: ESOPHAGUS/BARIUM SWALLOW/TABLET STUDY TECHNIQUE: Single contrast examination was performed using thin liquid barium. This exam was performed by Alwyn Ren, NP, and was supervised and interpreted by Rad . FLUOROSCOPY: Fluoro time: 1.12 sec COMPARISON:  None Available. FINDINGS: Swallowing: Significant delay in swallow initiation/mechanism. No vestibular penetration or aspiration seen. Pharynx: Unremarkable. Esophagus: Prominent cricopharyngeal bar. Small diverticulum in the lower esophagus. Questionable extrinsic compression along the anterior aspect of the distal esophagus, best seen on image 45 of series 4. Esophageal motility: Advanced dysmotility with spasms. Hiatal Hernia: None. Gastroesophageal reflux: None visualized. Ingested 13 mm barium tablet: Not given. Patient needs pills crushed. Other: None. IMPRESSION: 1. 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 Further evaluation with endoscopy is recommended. 2. Significant delay in swallow  initiation/mechanism. Prominent cricopharyngeal bar. 3. Advanced esophageal dysmotility with spasms. 4. Limited study due to patient's body habitus and mobility limitations. Electronically Signed   By: Acquanetta Belling M.D.   On: 01/14/2023 13:39   US Abdomen Complete  Result Date: 01/09/2023 CLINICAL DATA:  [id number] Gallbladder dilatation [id number] Generalized abdominal pain [id number] EXAM: ABDOMEN ULTRASOUND COMPLETE COMPARISON:  None Available. FINDINGS: Gallbladder: Gallbladder appears distended. Echogenic structure in the gallbladder is consistent with sludge. No shadowing gallstones or wall thickening visualized. No sonographic Murphy sign noted by sonographer. Common bile duct: Diameter: 0.4cm. Liver: No focal lesion identified. Hyperechoic consistent with fatty infiltration. IVC: No abnormality visualized. Pancreas: Obscured by bowel gas. Spleen: Size and appearance within normal limits. Right Kidney: Length: 10.6 cm. Echogenicity increased consistent with chronic medical renal disease. No mass or hydronephrosis visualized. No shadowing stones. Left Kidney: Length: 9.6cm. Echogenicity increased consistent with chronic medical renal disease. Cyst measures 5.5 cm. No follow up recommended based on the ultrasound appearance of the cyst. No solid mass or hydronephrosis visualized. No shadowing stones. Abdominal aorta: Obscured by bowel gas. IMPRESSION: 1. Hepatic fatty infiltration. 2. Gallbladder distended. 3. Minimal gallbladder sludge. 4. Echogenic kidneys consistent with chronic medical renal disease. 5. Left kidney cyst. 6.  There was limited visualization, as described. Electronically Signed   By: Layla Maw M.D.   On: 01/09/2023 11:17   CT ABDOMEN PELVIS WO CONTRAST  Result Date: 01/08/2023 CLINICAL DATA:  Abdominal pain. EXAM: CT ABDOMEN AND PELVIS WITHOUT CONTRAST TECHNIQUE: Multidetector CT imaging of the abdomen and pelvis was performed following the standard protocol without IV contrast.  RADIATION DOSE REDUCTION: This exam was performed according to the departmental dose-optimization program which includes automated exposure control, adjustment of the mA and/or kV according to patient size and/or use of iterative reconstruction technique. COMPARISON:  CT 10/15/2022 and older FINDINGS: Evaluation significant limited by motion despite multiple attempts. As per the CT technologist, the patient had difficulty with the breathing instructions. Lower chest: Lung bases are limited by motion. There is some basilar atelectasis. Coronary artery calcifications are seen. Hepatobiliary: Grossly preserved parenchyma. Gallbladder is dilated. Pancreas: No obvious pancreatic mass. Spleen: Spleen is nonenlarged. Adrenals/Urinary Tract: Nodularity of the adrenal glands, poorly defined with the motion. No renal collecting system dilatation. No obvious obstructing stones. There is some cystic lesions in the left kidney including a larger lower pole focus with some septations and calcifications. Again evaluation is limited but appears similar to previous. Mildly distended urinary bladder. Stomach/Bowel: Surgical changes gastric bypass. The stomach, small and large bowel are nondilated. There are some scattered stool. Details of the bowel are limited. Vascular/Lymphatic: Scattered vascular calcifications. No obvious dilatation of the abdominal aorta. IVC is nondilated. No obvious lymph node enlargement. Reproductive: Status post hysterectomy. No adnexal masses. Other: Markedly limited by motion. No obvious fluid collections. No large amounts of free air. Musculoskeletal: Scattered degenerative changes of the spine and pelvis. Overall moderate to severe. IMPRESSION: Distended gallbladder. If there is concern of gallbladder pathology ultrasound may be useful as the next step in the workup. Grossly stable left-sided renal cystic foci. Surgical changes of previous gastric bypass Markedly limited examination due to the level of  motion. No obvious obstruction, free air or free fluid. In addition, a repeat study could be considered when the patient is more clinically able. Electronically Signed   By: Karen Kays M.D.   On: 01/08/2023 13:41   CT Head Wo Contrast  Result Date: 01/08/2023 CLINICAL DATA:  Altered mental status, weakness for 3 days, confusion EXAM: CT HEAD WITHOUT CONTRAST TECHNIQUE: Contiguous axial images were obtained from the base of the skull through the vertex without intravenous contrast. RADIATION DOSE REDUCTION: This exam was performed according to the departmental dose-optimization program which includes automated exposure control, adjustment of the mA and/or kV according to patient size and/or use of iterative reconstruction technique. COMPARISON:  01/03/2023 FINDINGS: Brain: No evidence of acute infarction, hemorrhage, hydrocephalus, extra-axial collection or mass lesion/mass effect. Mild periventricular white matter hypodensity. Vascular: No hyperdense vessel or unexpected calcification. Skull: Normal. Negative for fracture or focal lesion. Sinuses/Orbits: No acute finding. Other: None. IMPRESSION: No acute intracranial pathology.  Small-vessel white matter disease. Electronically Signed   By: Jearld Lesch M.D.   On: 01/08/2023 13:38   DG Chest Port 1 View  Result Date: 01/08/2023 CLINICAL DATA:  Altered mental status. EXAM: PORTABLE CHEST 1 VIEW COMPARISON:  01/03/2023 FINDINGS: Low lung volumes. Similar streaky density in the retrocardiac medial left base, likely vascular anatomy. No focal consolidation, pulmonary edema, or pleural effusion. Cardiopericardial silhouette is at upper limits of normal for size. No acute bony abnormality. Telemetry leads overlie the chest. IMPRESSION: No active disease. Electronically Signed   By: Kennith Center M.D.   On:  01/08/2023 12:23    Catarina Hartshorn, DO  [facility]  If 7PM-7AM, please contact night-coverage [email or web address] Password [email or web address] 01/16/2023, 1:25 PM    LOS: 8 days

## 2023-01-17 ENCOUNTER — Encounter (HOSPITAL_COMMUNITY): Payer: Self-pay | Admitting: Family Medicine

## 2023-01-17 ENCOUNTER — Inpatient Hospital Stay (HOSPITAL_COMMUNITY): Payer: Medicare HMO

## 2023-01-17 ENCOUNTER — Telehealth (INDEPENDENT_AMBULATORY_CARE_PROVIDER_SITE_OTHER): Payer: Self-pay | Admitting: Family Medicine

## 2023-01-17 DIAGNOSIS — R627 Adult failure to thrive: Secondary | ICD-10-CM | POA: Diagnosis not present

## 2023-01-17 DIAGNOSIS — Z7189 Other specified counseling: Secondary | ICD-10-CM | POA: Diagnosis not present

## 2023-01-17 DIAGNOSIS — R404 Transient alteration of awareness: Secondary | ICD-10-CM

## 2023-01-17 DIAGNOSIS — G9341 Metabolic encephalopathy: Secondary | ICD-10-CM | POA: Diagnosis not present

## 2023-01-17 DIAGNOSIS — Z91199 Patient's noncompliance with other medical treatment and regimen due to unspecified reason: Secondary | ICD-10-CM

## 2023-01-17 DIAGNOSIS — N179 Acute kidney failure, unspecified: Secondary | ICD-10-CM | POA: Diagnosis not present

## 2023-01-17 DIAGNOSIS — E870 Hyperosmolality and hypernatremia: Secondary | ICD-10-CM | POA: Diagnosis not present

## 2023-01-17 LAB — MAGNESIUM: Magnesium: 1.2 mg/dL — ABNORMAL LOW (ref 1.7–2.4)

## 2023-01-17 LAB — COMPREHENSIVE METABOLIC PANEL
ALT: 14 U/L (ref 0–44)
AST: 13 U/L — ABNORMAL LOW (ref 15–41)
Albumin: 1.7 g/dL — ABNORMAL LOW (ref 3.5–5.0)
Alkaline Phosphatase: 106 U/L (ref 38–126)
Anion gap: 6 (ref 5–15)
BUN: 18 mg/dL (ref 8–23)
CO2: 28 mmol/L (ref 22–32)
Calcium: 7.1 mg/dL — ABNORMAL LOW (ref 8.9–10.3)
Chloride: 104 mmol/L (ref 98–111)
Creatinine, Ser: 1.11 mg/dL — ABNORMAL HIGH (ref 0.44–1.00)
GFR, Estimated: 54 mL/min — ABNORMAL LOW (ref >60)
Glucose, Bld: 397 mg/dL — ABNORMAL HIGH (ref 70–99)
Potassium: 3.2 mmol/L — ABNORMAL LOW (ref 3.5–5.1)
Sodium: 138 mmol/L (ref 135–145)
Total Bilirubin: 0.7 mg/dL (ref 0.3–1.2)
Total Protein: 4.4 g/dL — ABNORMAL LOW (ref 6.5–8.1)

## 2023-01-17 LAB — GLUCOSE, CAPILLARY
Glucose-Capillary: 89 mg/dL (ref 70–99)
Glucose-Capillary: 74 mg/dL (ref 70–99)

## 2023-01-17 LAB — CBC
HCT: 25.4 % — ABNORMAL LOW (ref 36.0–46.0)
Hemoglobin: 8.2 g/dL — ABNORMAL LOW (ref 12.0–15.0)
MCH: 27.2 pg (ref 26.0–34.0)
MCHC: 32.3 g/dL (ref 30.0–36.0)
MCV: 84.1 fL (ref 80.0–100.0)
Platelets: 233 10*3/uL (ref 150–400)
RBC: 3.02 MIL/uL — ABNORMAL LOW (ref 3.87–5.11)
RDW: 22 % — ABNORMAL HIGH (ref 11.5–15.5)
WBC: 12.7 10*3/uL — ABNORMAL HIGH (ref 4.0–10.5)
nRBC: 0 % (ref 0.0–0.2)

## 2023-01-17 LAB — CORTISOL-AM, BLOOD: Cortisol - AM: 25.7 ug/dL — ABNORMAL HIGH (ref 6.7–22.6)

## 2023-01-17 MED ORDER — STERILE WATER FOR INJECTION IJ SOLN
INTRAMUSCULAR | Status: AC
  Filled 2023-01-17: qty 10

## 2023-01-17 MED ORDER — THIAMINE HCL 100 MG/ML IJ SOLN
500.0000 mg | Freq: Three times a day (TID) | INTRAVENOUS | Status: AC
  Administered 2023-01-17 – 2023-01-18 (×5): 500 mg via INTRAVENOUS
  Filled 2023-01-17 (×8): qty 5

## 2023-01-17 MED ORDER — DIAZEPAM 5 MG/ML IJ SOLN
2.5000 mg | Freq: Two times a day (BID) | INTRAMUSCULAR | Status: DC | PRN
  Administered 2023-01-17 – 2023-01-20 (×2): 2.5 mg via INTRAVENOUS
  Filled 2023-01-17 (×2): qty 2

## 2023-01-17 MED ORDER — MAGNESIUM SULFATE 2 GM/50ML IV SOLN
2.0000 g | Freq: Once | INTRAVENOUS | Status: AC
  Administered 2023-01-17: 2 g via INTRAVENOUS
  Filled 2023-01-17: qty 50

## 2023-01-17 MED ORDER — SINCALIDE 5 MCG IJ SOLR
INTRAMUSCULAR | Status: AC
  Filled 2023-01-17: qty 5

## 2023-01-17 MED ORDER — SINCALIDE 5 MCG IJ SOLR
0.0200 ug/kg | Freq: Once | INTRAMUSCULAR | Status: AC
  Administered 2023-01-17: 2.1 ug via INTRAVENOUS

## 2023-01-17 MED ORDER — TECHNETIUM TC 99M MEBROFENIN IV KIT
5.5000 | PACK | Freq: Once | INTRAVENOUS | Status: AC | PRN
  Administered 2023-01-17: 5.5 via INTRAVENOUS

## 2023-01-17 MED ORDER — SODIUM CHLORIDE 0.9 % IV SOLN
1.0000 g | Freq: Three times a day (TID) | INTRAVENOUS | Status: DC
  Administered 2023-01-17 – 2023-01-19 (×6): 1 g via INTRAVENOUS
  Filled 2023-01-17 (×8): qty 20

## 2023-01-17 MED ORDER — ENSURE ENLIVE PO LIQD: 237.0000 mL | Freq: Two times a day (BID) | ORAL | Status: DC

## 2023-01-17 MED ORDER — POTASSIUM CHLORIDE 10 MEQ/100ML IV SOLN
10.0000 meq | INTRAVENOUS | Status: AC
  Administered 2023-01-17 (×4): 10 meq via INTRAVENOUS
  Filled 2023-01-17: qty 100

## 2023-01-17 NOTE — Progress Notes (Signed)
Gastroenterology Progress Note    Patient ID: Christine [name]; [id number]; [birth year]    Subjective   Just returned from HIDA scan. Pain in RUQ worsened with scan. Unable to provide meaningful history.    Objective   Vital signs in last 24 hours Temp:  [97.7 F (36.5 C)-99.3 F (37.4 C)] 97.7 F (36.5 C) (09/23 0522) Pulse Rate:  [87-115] 87 (09/23 0522) Resp:  [18-20] 20 (09/23 0522) BP: (133-154)/(75-86) 133/75 (09/23 0522) SpO2:  [98 %-100 %] 100 % (09/23 0522) FiO2 (%):  [21 %] 21 % (09/22 2310) Weight:  [104.4 kg] 104.4 kg (09/23 0522) Last BM Date : 01/16/23  Physical Exam General:   Alert and oriented to person only,  Head:  Normocephalic and atraumatic. Abdomen:  Bowel sounds present, soft, TTP epigastric and RUQ non-distended. No HSM or hernias noted. No rebound or guarding. No masses appreciated  Extremities:  Without clubbing edema.   Intake/Output from previous day: 09/22 0701 - 09/23 0700 In: 2032.6 [I.V.:808.1; IV Piggyback:1224.5] Out: -  Intake/Output this shift: No intake/output data recorded.  Lab Results  Recent Labs    01/16/23 0428 01/17/23 0452  WBC 10.8* 12.7*  HGB 9.9* 8.2*  HCT 32.6* 25.4*  PLT 262 233   BMET Recent Labs    01/15/23 0439 01/16/23 0428 01/17/23 0452  NA 141 140 138  K 3.8 3.4* 3.2*  CL 116* 117* 104  CO2 16* 15* 28  GLUCOSE 88 106* 397*  BUN 14 13 18   CREATININE 0.85 0.99 1.11*  CALCIUM 8.7* 8.3* 7.1*   LFT Recent Labs    01/17/23 0452  PROT 4.4*  ALBUMIN 1.7*  AST 13*  ALT 14  ALKPHOS 106  BILITOT 0.7     Studies/Results [place] EKG SITE RITE  Result Date: 01/16/2023 If Site Rite image not attached, placement could not be confirmed due to current cardiac rhythm.  CT CHEST W CONTRAST  Result Date: 01/15/2023 CLINICAL DATA:  Questioning esophageal mass. EXAM: CT CHEST WITH CONTRAST TECHNIQUE: Multidetector CT imaging of the chest was performed during intravenous contrast administration.  RADIATION DOSE REDUCTION: This exam was performed according to the departmental dose-optimization program which includes automated exposure control, adjustment of the mA and/or kV according to patient size and/or use of iterative reconstruction technique. CONTRAST:  75mL OMNIPAQUE IOHEXOL 300 MG/ML  SOLN COMPARISON:  Esophagram 01/14/2023 FINDINGS: Cardiovascular: No significant vascular findings. Normal heart size. No pericardial effusion. Mediastinum/Nodes: No enlarged mediastinal, hilar, or axillary lymph nodes. Thyroid gland, trachea, and esophagus demonstrate no significant findings. Lungs/Pleura: There is minimal atelectasis in the left lower lobe. The lungs are otherwise clear. There is no pleural effusion or pneumothorax. Upper Abdomen: No acute findings. Rounded 2 point 0 cm left renal hypodensity likely represents a cyst or complex cyst. There are postsurgical changes in the stomach. Musculoskeletal: No chest wall abnormality. No acute or significant osseous findings. IMPRESSION: 1. No acute cardiopulmonary process. 2. Minimal atelectasis in the left lower lobe. Electronically Signed   By: Darliss Cheney M.D.   On: 01/15/2023 22:29   DG ESOPHAGUS W SINGLE CM (SOL OR THIN BA)  Result Date: 01/14/2023 CLINICAL DATA:  Patient with a history of dysphagia. EXAM: ESOPHAGUS/BARIUM SWALLOW/TABLET STUDY TECHNIQUE: Single contrast examination was performed using thin liquid barium. This exam was performed by Alwyn Ren, NP, and was supervised and interpreted by Rad . FLUOROSCOPY: Fluoro time: 1.12 sec COMPARISON:  None Available. FINDINGS: Swallowing: Significant delay in swallow initiation/mechanism. No vestibular penetration or aspiration seen. Pharynx:  Unremarkable. Esophagus: Prominent cricopharyngeal bar. Small diverticulum in the lower esophagus. Questionable extrinsic compression along the anterior aspect of the distal esophagus, best seen on image 45 of series 4. Esophageal motility: Advanced  dysmotility with spasms. Hiatal Hernia: None. Gastroesophageal reflux: None visualized. Ingested 13 mm barium tablet: Not given. Patient needs pills crushed. Other: None. IMPRESSION: 1. 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 Further evaluation with endoscopy is recommended. 2. Significant delay in swallow initiation/mechanism. Prominent cricopharyngeal bar. 3. Advanced esophageal dysmotility with spasms. 4. Limited study due to patient's body habitus and mobility limitations. Electronically Signed   By: Acquanetta Belling M.D.   On: 01/14/2023 13:39   US Abdomen Complete  Result Date: 01/09/2023 CLINICAL DATA:  [id number] Gallbladder dilatation [id number] Generalized abdominal pain [id number] EXAM: ABDOMEN ULTRASOUND COMPLETE COMPARISON:  None Available. FINDINGS: Gallbladder: Gallbladder appears distended. Echogenic structure in the gallbladder is consistent with sludge. No shadowing gallstones or wall thickening visualized. No sonographic Murphy sign noted by sonographer. Common bile duct: Diameter: 0.4cm. Liver: No focal lesion identified. Hyperechoic consistent with fatty infiltration. IVC: No abnormality visualized. Pancreas: Obscured by bowel gas. Spleen: Size and appearance within normal limits. Right Kidney: Length: 10.6 cm. Echogenicity increased consistent with chronic medical renal disease. No mass or hydronephrosis visualized. No shadowing stones. Left Kidney: Length: 9.6cm. Echogenicity increased consistent with chronic medical renal disease. Cyst measures 5.5 cm. No follow up recommended based on the ultrasound appearance of the cyst. No solid mass or hydronephrosis visualized. No shadowing stones. Abdominal aorta: Obscured by bowel gas. IMPRESSION: 1. Hepatic fatty infiltration. 2. Gallbladder distended. 3. Minimal gallbladder sludge. 4. Echogenic kidneys consistent with chronic medical renal disease. 5. Left kidney  cyst. 6. There was limited visualization, as described. Electronically Signed   By: Layla Maw M.D.   On: 01/09/2023 11:17   CT ABDOMEN PELVIS WO CONTRAST  Result Date: 01/08/2023 CLINICAL DATA:  Abdominal pain. EXAM: CT ABDOMEN AND PELVIS WITHOUT CONTRAST TECHNIQUE: Multidetector CT imaging of the abdomen and pelvis was performed following the standard protocol without IV contrast. RADIATION DOSE REDUCTION: This exam was performed according to the departmental dose-optimization program which includes automated exposure control, adjustment of the mA and/or kV according to patient size and/or use of iterative reconstruction technique. COMPARISON:  CT 10/15/2022 and older FINDINGS: Evaluation significant limited by motion despite multiple attempts. As per the CT technologist, the patient had difficulty with the breathing instructions. Lower chest: Lung bases are limited by motion. There is some basilar atelectasis. Coronary artery calcifications are seen. Hepatobiliary: Grossly preserved parenchyma. Gallbladder is dilated. Pancreas: No obvious pancreatic mass. Spleen: Spleen is nonenlarged. Adrenals/Urinary Tract: Nodularity of the adrenal glands, poorly defined with the motion. No renal collecting system dilatation. No obvious obstructing stones. There is some cystic lesions in the left kidney including a larger lower pole focus with some septations and calcifications. Again evaluation is limited but appears similar to previous. Mildly distended urinary bladder. Stomach/Bowel: Surgical changes gastric bypass. The stomach, small and large bowel are nondilated. There are some scattered stool. Details of the bowel are limited. Vascular/Lymphatic: Scattered vascular calcifications. No obvious dilatation of the abdominal aorta. IVC is nondilated. No obvious lymph node enlargement. Reproductive: Status post hysterectomy. No adnexal masses. Other: Markedly limited by motion. No obvious fluid collections. No large  amounts of free air. Musculoskeletal: Scattered degenerative changes of the spine and pelvis. Overall moderate to severe. IMPRESSION: Distended gallbladder. If there is concern of gallbladder pathology  ultrasound may be useful as the next step in the workup. Grossly stable left-sided renal cystic foci. Surgical changes of previous gastric bypass Markedly limited examination due to the level of motion. No obvious obstruction, free air or free fluid. In addition, a repeat study could be considered when the patient is more clinically able. Electronically Signed   By: Karen Kays M.D.   On: 01/08/2023 13:41   CT Head Wo Contrast  Result Date: 01/08/2023 CLINICAL DATA:  Altered mental status, weakness for 3 days, confusion EXAM: CT HEAD WITHOUT CONTRAST TECHNIQUE: Contiguous axial images were obtained from the base of the skull through the vertex without intravenous contrast. RADIATION DOSE REDUCTION: This exam was performed according to the departmental dose-optimization program which includes automated exposure control, adjustment of the mA and/or kV according to patient size and/or use of iterative reconstruction technique. COMPARISON:  01/03/2023 FINDINGS: Brain: No evidence of acute infarction, hemorrhage, hydrocephalus, extra-axial collection or mass lesion/mass effect. Mild periventricular white matter hypodensity. Vascular: No hyperdense vessel or unexpected calcification. Skull: Normal. Negative for fracture or focal lesion. Sinuses/Orbits: No acute finding. Other: None. IMPRESSION: No acute intracranial pathology.  Small-vessel white matter disease. Electronically Signed   By: Jearld Lesch M.D.   On: 01/08/2023 13:38   DG Chest Port 1 View  Result Date: 01/08/2023 CLINICAL DATA:  Altered mental status. EXAM: PORTABLE CHEST 1 VIEW COMPARISON:  01/03/2023 FINDINGS: Low lung volumes. Similar streaky density in the retrocardiac medial left base, likely vascular anatomy. No focal consolidation, pulmonary  edema, or pleural effusion. Cardiopericardial silhouette is at upper limits of normal for size. No acute bony abnormality. Telemetry leads overlie the chest. IMPRESSION: No active disease. Electronically Signed   By: Kennith Center M.D.   On: 01/08/2023 12:23    Assessment  72 y.o. female admitted with generalized weakness/fatigue, encephalopathy, metabolic derangements on admission, and reporting dysphagia both oropharyngeal and esophageal.   [place]: fatty liver, gallbladder distended, minimal sludge, HIDA for today.   BPE 9/20 with distal esophageal diverticulum with possible associated mass causing extrinsic compression of anterior distal esophageal wall, advanced esophageal dysmotility. CT chest without mass. Last EGD Dec 2023 with antral erythema, multiple fundic gland polyps, single gastric polyp s/p removal, normal duodenum.   Unable to obtain meaningful information from patient today due to RUQ pain, which worsened during HIDA scan. In light of increasing leukocytosis, HIDA planned for today to rule out cholecystitis.   If this is unrevealing, recommend EGD to further evaluate abnormalities seen on CT, even though EGD Dec 2023 unrevealing.    Plan / Recommendations  Follow-up on pending HIDA NPO after midnight Continue PPI BID    LOS: 9 days    01/17/2023, 9:44 AM  Gelene Mink, PhD, ANP-BC [facility]

## 2023-01-17 NOTE — Progress Notes (Signed)
Patient compliant with oral care this morning, she had a copious amount of white/thick salliva in back of mouth , successfully removed, cleansed her lips with wash cloth and applied mouth moisturizer , Will place oral care protocol for patient.

## 2023-01-17 NOTE — Plan of Care (Signed)
Problem: Education: Goal: Ability to state signs and symptoms to report to health care provider will improve Outcome: Progressing Goal: Knowledge of the prescribed self-care regimen will improve Outcome: Progressing Goal: Knowledge of discharge needs will improve Outcome: Progressing   Problem: Activity: Goal: Ability to tolerate increased activity will improve Outcome: Progressing

## 2023-01-17 NOTE — Telephone Encounter (Signed)
Patient scheduled today for a VV

## 2023-01-17 NOTE — Progress Notes (Signed)
Went in to place patient on CPAP, patient stated she was not ready to go.  Asked RN, who was at bedside, to call when patient was ready to go on.

## 2023-01-17 NOTE — Consult Note (Signed)
Consultation Note Date: 01/17/2023   Patient Name: Christine [name]  DOB: 23-Jan-1951  MRN: [id number]  Age / Sex: 72 y.o., female  PCP: [name], DO Referring Physician: Catarina Hartshorn, MD  Reason for Consultation: Establishing goals of care  HPI/Patient Profile: 72 y.o. female  with past medical history of morbid obesity 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 Dossie Der, with progressive decline since surgery including multiple hospitalizations and ED visits in several months, not returned home since admit to [facility] rehab in July for short-term rehab, hospitalized July and August of this year with dehydration/weakness/acute renal failure/failure to thrive; she is also noted to have a history of HTN/HLD, OSA on CPAP, GERD, anxiety, anemia, bilateral vasculitis, history of hiatal hernia admitted on 01/08/2023 with hypernatremia/severe dehydration with failure to thrive/esophageal dysmotility with esophageal gram September 20 showing advanced esophageal dysmotility but no obstruction.   Clinical Assessment and Goals of Care: Attempt to see Christine [name], Christine [name], but she was off the floor.  Face-to-face discussion with bedside nursing staff and transition of care team. Face-to-face conference with attending who shares that [name]'s brother is flying in from [place] tomorrow.  I have reviewed medical records including EPIC notes, labs and imaging, received report from RN, assessed the patient.    I returned to Christine [name]'s room to find her resting quietly in bed.  She appears acutely/chronically ill and frail.  She will make but not keep eye contact.  Her lip are open and she is grimacing.  Her mouth is very dry.  She is able to tell me her name, but not our location.  She continues to state that "it's a mess".  I asked that the month, she states January.  I am not sure that she  can make her basic needs known.  There is no family at bedside at this time.  I share that I will call her sister, and she agrees.  Call to sister/HCPOA, [name] to discuss diagnosis prognosis, GOC, EOL wishes, disposition and options.  I introduced Palliative Medicine as specialized medical care for people living with serious illness. It focuses on providing relief from the symptoms and stress of a serious illness. The goal is to improve quality of life for both the patient and the family.  We discussed a brief life review of the patient.  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  She retired around age 79.  She never married and has no children.  She has Sister [name] and brother Christine [name].  We and then focused on their current illness.  We talked about her failure to thrive and declines since her bariatric surgery in May.  We talk about her functional status, her mental status, her albumin levels, her poor by mouth intake.  We talk about artificial hydration and nutrition.  [name] states that she discussed these choices with her sister yesterday before leaving.  She shared that they felt that they would try a temporary  nasal tube if offered.  We talked about the risks and benefits of temporary nasal tube feeding.  We also talk about albumin levels, what is normal.  We talked about the treatment plan.  The natural disease trajectory and expectations at EOL were discussed.  I attempted to elicit values and goals of care important to the patient.  Christine [name] has advanced directives in the ACP tab of her epic chart.  I shared her written wishes with her sister, "being independent with occasional help as needed" is what Christine [name] described as quality of life.  Christine [name] had stated that she would not want extraordinary measures, this was shared with sister.    I encouraged her to consider CODE STATUS, DNR recommended.  Advanced directives,  concepts specific to code status, artifical feeding and hydration, and rehospitalization were considered and discussed.  Christine [name] wrote, "at end-of-life care, just let me go no machines or extra care.  For medical emergencies using special treatments is good but within limits.  If it is determined that it is extremely likely that I would not survive then stop all treatments and let me go".  This was shared verbatim with sister.  The difference between aggressive medical intervention and comfort care was considered in light of the patient's goals of care.  Hospice Care services outpatient were explained and offered.  We talked about how to make choices for loved ones.  [name] states that she is communicating with her brother Christine [name] and other family members.  She shares that Christine [name] has decided he will not be able to come to the hospital to see Christine [name] tomorrow.   Discussed the importance of continued conversation with family and the medical providers regarding overall plan of care and treatment options, ensuring decisions are within the context of the patient's values and GOCs. Questions and concerns were addressed. The family was encouraged to call with questions or concerns.  PMT will continue to support holistically.  Conference with attending, GI NP, bedside nursing staff, transition of care team, related to patient condition, needs, goals of care, disposition.   HCPOA  HCPOA -Christine [name] names her sister, Christine [name], as her healthcare surrogate.  Form found in ACP tab of epic chart.    SUMMARY OF RECOMMENDATIONS   At this point continue full scope/full code.   Time for outcomes. Considering artificial feeding.   Code Status/Advance Care Planning: Full code - Christine [name] wrote, "at end-of-life care, just let me go no machines or extra care.  For medical emergencies using special treatments is good but within limits.  If it is determined that it is extremely likely that I would not survive then stop all  treatments and let me go".  This was shared verbatim with sister. I encouraged [name] to think about, pray about what is next for Christine [name].  Symptom Management:  Per hospitalist, no additional needs at this time.  Palliative Prophylaxis:  Frequent Pain Assessment, Palliative Wound Care, and Turn Reposition  Additional Recommendations (Limitations, Scope, Preferences): Full Scope Treatment  Psycho-social/Spiritual:  Desire for further Chaplaincy support:no Additional Recommendations: Caregiving  Support/Resources and Education on Hospice  Prognosis:  Unable to determine, based on outcomes.  Clearly qualifies for residential hospice with acute and chronic health concerns an albumin of 1.7.  Discharge Planning: To be determined, may choose LTAC      Primary Diagnoses: Present on Admission:  Hypernatremia  Gastro-esophageal reflux disease with esophagitis  Essential hypertension  Osteoarthrosis  GAD (generalized anxiety disorder)  Mixed incontinence urge and stress  Morbid obesity (HCC)  Abdominal pain  Gout  Gastroesophageal reflux disease  Prediabetes  Memory difficulties  Hyperlipidemia  Severe dehydration  Acute metabolic encephalopathy  Altered mental status  Failure to thrive in adult  AKI (acute kidney injury) (HCC)  Gallbladder dilatation   I have reviewed the medical record, interviewed the patient and family, and examined the patient. The following aspects are pertinent.  Past Medical History:  Diagnosis Date   Allergy    dust, pollen, sulfa, prednisone   Anemia    Anxiety    Arthritis    "knees" (04/26/2016)   Colon polyps    benign per pt   Coronary artery disease    mild per 2015 cath in [place] (OM1 30%, RCA 30%)   GERD (gastroesophageal reflux disease)    Gout    History of hiatal hernia    Hyperlipidemia 05/20/2021   Hypertension    Migraine    "none since early /2017" (05/06/2016)   Obesity    OSA on CPAP    uses CPAP   Pre-diabetes     Pre-operative cardiovascular examination 08/22/2008   Renal insufficiency 11/09/2022   Vasculitis (HCC)    Bilateral   Social History   Socioeconomic History   Marital status: Single    Spouse name: Not on file   Number of children: 0   Years of education: Not on file   Highest education level: Bachelor's degree (e.g., BA, AB, BS)  Occupational History   Occupation: retired  Tobacco Use   Smoking status: Former    Current packs/day: 0.00    Average packs/day: 0.1 packs/day for 5.0 years (0.6 ttl pk-yrs)    Types: Cigarettes    Start date: 04/26/1973    Quit date: 04/26/1978    Years since quitting: 44.7   Smokeless tobacco: Never  Vaping Use   Vaping status: Never Used  Substance and Sexual Activity   Alcohol use: No   Drug use: No   Sexual activity: Not Currently    Birth control/protection: None  Other Topics Concern   Not on file  Social History Narrative   Right handed    Lives alone with dog    Social Determinants of Health   Financial Resource Strain: Low Risk  (12/01/2022)   Received from [facility]   Overall Financial Resource Strain (CARDIA)    Difficulty of Paying Living Expenses: Not very hard  Food Insecurity: Patient Unable To Answer (01/08/2023)   Hunger Vital Sign    Worried About Running Out of Food in the Last Year: Patient unable to answer    Ran Out of Food in the Last Year: Patient unable to answer  Transportation Needs: Patient Unable To Answer (01/08/2023)   PRAPARE - Transportation    Lack of Transportation (Medical): Patient unable to answer    Lack of Transportation (Non-Medical): Patient unable to answer  Physical Activity: Insufficiently Active (07/13/2022)   Exercise Vital Sign    Days of Exercise per Week: 3 days    Minutes of Exercise per Session: 20 min  Stress: No Stress Concern Present (07/13/2022)   [employer] of Occupational Health - Occupational Stress Questionnaire    Feeling of Stress : Not at all  Social Connections:  Moderately Integrated (12/01/2022)   [facility]   Social Connection and Isolation Panel [NHANES]    Frequency of Communication with Friends and Family: Once a week    Frequency of Social Gatherings with Friends and Family: Twice a  week    Attends Religious Services: 1 to 4 times per year    Active Member of Clubs or Organizations: No    Attends Engineer, structural: 1 to 4 times per year    Marital Status: Never married   Family History  Problem Relation Age of Onset   Diabetes Mother    Heart disease Mother    High blood pressure Mother    Asthma Mother    Arthritis Mother    Diabetes Father    Heart disease Father    High blood pressure Father    Asthma Father    Diabetes Sister    Alcohol abuse Brother    Drug abuse Brother    Arthritis Sister    Diabetes Sister    Varicose Veins Sister    Colon cancer Neg Hx    Esophageal cancer Neg Hx    Rectal cancer Neg Hx    Scheduled Meds:  (feeding supplement) PROSource Plus  30 mL Oral BID BM   antiseptic oral rinse  15 mL Mouth Rinse BID   Chlorhexidine Gluconate Cloth  6 each Topical Daily   colchicine  0.6 mg Oral Daily   DULoxetine  60 mg Oral Daily   enoxaparin (LOVENOX) injection  40 mg Subcutaneous Q24H   feeding supplement  237 mL Oral BID BM   feeding supplement (GLUCERNA SHAKE)  237 mL Oral TID BM   ferrous sulfate  325 mg Oral Q breakfast   folic acid  1 mg Intravenous Daily   multivitamin with minerals  1 tablet Oral Daily   oxybutynin  15 mg Oral QHS   pantoprazole (PROTONIX) IV  40 mg Intravenous Q12H   sodium chloride flush  10-40 mL Intracatheter Q12H   Continuous Infusions:  piperacillin-tazobactam (ZOSYN)  IV 3.375 g (01/17/23 0556)   sodium bicarbonate 150 mEq in dextrose 5 % 1,150 mL infusion 75 mL/hr at 01/16/23 1627   thiamine (VITAMIN B1) injection 500 mg (01/17/23 0918)   PRN Meds:.acetaminophen **OR** acetaminophen, diclofenac Sodium, ondansetron **OR** ondansetron  (ZOFRAN) IV, mouth rinse, mouth rinse, oxyCODONE, sodium chloride flush, traZODone Medications Prior to Admission:  Prior to Admission medications   Medication Sig Start Date End Date Taking? Authorizing Provider  acetaminophen (TYLENOL) 325 MG tablet Take 650 mg by mouth every 4 (four) hours as needed for mild pain or moderate pain.   Yes [provider]  amLODipine (NORVASC) 10 MG tablet Take 10 mg by mouth daily.   Yes [provider]  antiseptic oral rinse (BIOTENE) LIQD 15 mLs by Mouth Rinse route in the morning and at bedtime.   Yes [provider]  ascorbic acid (VITAMIN C) 500 MG tablet Take 500 mg by mouth daily.   Yes [provider]  colchicine 0.6 MG tablet Take 1.2 mg by mouth daily.   Yes [provider]  diclofenac Sodium (VOLTAREN) 1 % GEL Apply 2 g topically 4 (four) times daily.   Yes [provider]  DULoxetine (CYMBALTA) 60 MG capsule Take 1 capsule (60 mg total) by mouth daily. 12/25/21  Yes [name], DO  ferrous sulfate 325 (65 FE) MG EC tablet Take 325 mg by mouth daily with breakfast.   Yes [provider]  folic acid (FOLVITE) 1 MG tablet Take 1 tablet (1 mg total) by mouth daily. 11/16/22  Yes Azucena Fallen, MD  gabapentin (NEURONTIN) 100 MG capsule Take 1 capsule (100 mg total) by mouth 3 (three) times daily. 11/15/22  Yes Azucena Fallen, MD  Multiple Vitamin (MULTIVITAMIN WITH MINERALS) TABS tablet Take 1 tablet by mouth daily. 11/16/22  Yes Azucena Fallen, MD  Nutritional Supplements (PROMOD) LIQD Take 30 mLs by mouth 2 (two) times daily.   Yes [provider]  omeprazole (PRILOSEC) 20 MG capsule Take 20 mg by mouth daily.   Yes [provider]  oxybutynin (DITROPAN XL) 15 MG 24 hr tablet TAKE 1 TABLET BY MOUTH EVERY NIGHT AT BEDTIME 12/30/22  Yes Wendling, Jilda Roche, DO  promethazine (PHENERGAN) 25 MG tablet Take 1 tablet (25 mg total) by mouth every 6 (six)  hours as needed for nausea or vomiting. 10/27/22  Yes Wendling, Jilda Roche, DO  senna (SENOKOT) 8.6 MG TABS tablet Take 1 tablet by mouth at bedtime.   Yes [provider]  sodium chloride irrigation 0.9 % irrigation Irrigate with 75 mLs as directed See admin instructions. Use 75 mL/hr intravenously every shift for fluid resuscitation for 2 days   Yes [provider]  traMADol (ULTRAM) 50 MG tablet Take 50 mg by mouth 3 (three) times daily as needed for moderate pain or severe pain. 01/07/23  Yes [provider]  triamterene-hydrochlorothiazide (MAXZIDE-25) 37.5-25 MG tablet Take 1 tablet by mouth daily. 09/28/22  Yes [name], DO  zinc gluconate 50 MG tablet Take 50 mg by mouth daily.   Yes [provider]   Allergies  Allergen Reactions   Prednisone Swelling    Legs swell    Tape Hives   Sulfa Antibiotics Rash   Review of Systems  Unable to perform ROS: Other    Physical Exam Vitals and nursing note reviewed.    Vital Signs: BP 133/75 (BP Location: Left Wrist)   Pulse 87   Temp 97.7 F (36.5 C) (Axillary)   Resp 20   Ht 5\' 1"  (1.549 m)   Wt 104.4 kg   SpO2 100%   BMI 43.49 kg/m  Pain Scale: Faces   Pain Score: 2    SpO2: SpO2: 100 % O2 Device:SpO2: 100 % O2 Flow Rate: .   IO: Intake/output summary:  Intake/Output Summary (Last 24 hours) at 01/17/2023 1111 Last data filed at 01/16/2023 1629 Gross per 24 hour  Intake 2032.57 ml  Output --  Net 2032.57 ml    LBM: Last BM Date : 01/16/23 Baseline Weight: Weight: 101.5 kg Most recent weight: Weight: 104.4 kg     Palliative Assessment/Data:     Time In: 1230   Time Out: 1345 Time Total: 75 minutes   Greater than 50%  of this time was spent counseling and coordinating care related to the above assessment and plan.  Signed by: Katheran Awe, NP   Please contact Palliative Medicine Team phone at [phone] for questions and concerns.  For individual provider: See  Loretha Stapler

## 2023-01-17 NOTE — Progress Notes (Addendum)
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 Dossie Der.  She has been progressively declining since surgery.  She has had multiple hospitalizations and ED visits in past several months.  Recently seen at [facility] on 01/03/23 with complaints of weakness and poor oral intake.  She was hospitalized in July and August of this year at different facilities for dehydration, weakness, acute renal failure and failure to thrive.  She has been residing in a SNF.  She apparently has not been eating or drinking and refusing to take her medications.  She was sent to ER today for evaluation of altered mentation.  She has had 3 days of progressive weakness and refusing oral intake.  She complains of pain all over body which is not new per record review. Pt is a poor historian.   She was found to be severely dehydrated with hypoglycemia and hypernatremic with a sodium of 157.  She also had acute kidney injury and distended gallbladder on CT scan.  CT head with no acute findings.  Pt was started on IV hydration and admission was requested.     The patient was started on IV hypotonic fluid with D5W with improvement of her electrolytes.  Her hypoglycemia improved on D5W.  Her electrolytes were optimized.  Her renal function gradually improved.  In addition, the patient's mental status improved and essentially returned to baseline.  Her hospitalization has been prolonged secondary to her deconditioning and difficulty finding appropriate placement.  In addition, the patient continues to have failure to thrive.  She continues to have very poor oral intake.  On numerous occasions, the patient with " pocket" pills and food in her mouth and not swallow.  She continued to  show delayed swallowing.  Speech therapy was consulted and did not feel that there was a pharyngeal etiology of the patient's swallowing.  Numerous discussions were held with the patient and family regarding the patient's lack of desire to eat and swallow her nutrition and pills.  Each day, nursing staff it has been an inordinate amount of time encouraging the patient.  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  GI was consulted to assist.  CT chest did not show any mass causing extrinsic compression on esophagus.  Again, it was felt there was no medically prohibitive reason why patient cannot swallow.  Palliative medicine was consulted to discuss GOC.   Assessment/Plan: Hypernatremia / Severe Dehydration  - pt presented with severe clinical findings of free water deficit  - continue D5W infusion  - monitor sodium levels daily >>improved - change IVF to D51/2NS with KCl   Hypoglycemia/Failure to Thrive - from poor oral intake and recent gastric bypass  - monitor CBG regularly - continue dextrose infusion >>rate decreased  -pt encouraged innumerable occasions to take po -D51/2NS turned off x 24 hours and pt has recurrent hypoglycemia -am cortisol 25.7   Adult Failure to Thrive/Esophageal dysmotility -Patient continues to have poor oral intake and no desire to eat -She continues to " pocket" her pills and food. -Nursing staff has spent an unwarranted amount of time each day encouraging the patient -  pt's sister traveled by train from [place] to come here to visit and help with arrangements for post hospital care -9/20 Esophagram--advanced esophageal dysmotility but no obstruction.  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 causing extrinsic compression of the anterior distal  esophageal -appreciate GI consult>>CT chest--LLL atelectasis, no mass, lungs clear -no medically prohibitive reason why patient cannot swallow -thiamine level ordered--result pending -start high dose thiamine -9/23--discussed with Dr. Otho Najjar be willing to place PEG if pt and family wants this   UTI -9/20 UA>50 WBC -9/20 urine culture = MDR Ecoli -d/c ceftriaxone -start merrem   Abdominal pain -9/14 CT abd--limited by motion, but  No obvious obstruction, free air or free fluid; s/p gastric bypass -9/15 Abd US--GB distended, minimal GB sludge -check LFTs -HIDA scan -start protonix   NAGMA -start bicarb drip   Hypokalemia/Hypomagnesemia - IV replacement ordered -added KCl to IVF   AKI  -baseline creatinine 0.6-1.0 -serum creatinine peaked 1.66 - prerenal from severe dehydration  - continue IV fluid hydration as ordered  - renal function improved--back to baseline   Acute metabolic encephalopathy/Failure to Thrive - secondary to severe hypernatremia, AKI - mental status now back to baseline -B12--1183 -folate 11.5 -TSH 1.725 -ammonia = 18 -consult speech therapy--no evidence of oropharygeal dysphagia -recheck UA >50 WBC -intermittent episodes of delirium partly due to hospital delirium and hypnotic meds   NAG Metabolic acidosis - added sodium bicarb tablets 9/16--pt not swallowing -01/16/23--started IV bicarb drip   OSA  - nightly CPAP ordered    Essential hypertension  - initially held home antihypertensives until she has been better rehydrated  - pt had been refusing all of her meds for past several days  - BPs now rebounding after hydration - restarted amlodipine 10 mg daily on 9/16     Generalized weakness  - secondary to acute metabolic derangements discussed above - PT eval requested 9/17    Morbid Obesity  - pt is postop s/p 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 Dossie Der. - her preop weight was 294.8 # and her 2nd postop  visit she weighed 261.6# - current weight 226.2 #  - continue vitamin supplements if patient is willing to swallow pills   GERD - IV pantoprazole ordered for GI protection until she can take oral again   Sacral pressure wounds - noted below, appreciate wound care nursing team consultation     Goals of care -discussed with sister, brother, patient>>pt does not want gastrostomy tube -pt understands that there is nothing medically prohibitive affecting her swallowing and that she is able to eat -she acknowledges that she chooses not to chew and swallow -consult palliative medicine -discussed other options including duotube/dobhoff vs TPN        Family Communication: sister and brother updated at bedside 9/22   Consultants:  none   Code Status:  FULL   DVT Prophylaxis:   [place] Lovenox     Procedures: As Listed in Progress Note Above   Antibiotics: Ceftriaxone 9/20>>9/22 Zosyn 9/22>>9/23 Merrem 9/23>>               Subjective: Patient denies fevers, chills, headache, chest pain, dyspnea, nausea, vomiting, diarrhea, abdominal pain, dysuria   Objective: Vitals:   01/16/23 1513 01/16/23 2103 01/17/23 0052 01/17/23 0522  BP: 136/76 (!) 142/84 (!) 154/86 133/75  Pulse: (!) 115 (!) 103 94 87  Resp: 18 20 19 20   Temp: 99.3 F (37.4 C) 98.8 F (37.1 C) 98.7 F (37.1 C)  97.7 F (36.5 C)  TempSrc: Oral Axillary Axillary Axillary  SpO2: 98% 100% 100% 100%  Weight:    104.4 kg  Height:        Intake/Output Summary (Last 24 hours) at 01/17/2023 1424 Last data filed at 01/16/2023 1629 Gross per 24 hour  Intake 2032.57 ml  Output --  Net 2032.57 ml   Weight change: 0.7 kg Exam:  General:  Pt is alert, follows commands appropriately, not in acute distress HEENT: No icterus, No thrush, No neck mass, [place]/AT Cardiovascular: RRR, S1/S2, no rubs, no gallops Respiratory: bibasilar rales.  No wheeze Abdomen: Soft/+BS, non tender, non distended, no  guarding Extremities:1+ LE edema, No lymphangitis, No petechiae, No rashes, no synovitis   Data Reviewed: I have personally reviewed following labs and imaging studies Basic Metabolic Panel: Recent Labs  Lab 01/12/23 0444 01/13/23 0453 01/14/23 0432 01/15/23 0439 01/16/23 0428 01/17/23 0452  NA 139 138 138 141 140 138  K 2.7* 3.6 4.0 3.8 3.4* 3.2*  CL 112* 114* 113* 116* 117* 104  CO2 17* 17* 16* 16* 15* 28  GLUCOSE 84 87 90 88 106* 397*  BUN 31* 23 19 14 13 18   CREATININE 1.11* 0.98 0.86 0.85 0.99 1.11*  CALCIUM 8.3* 8.4* 8.5* 8.7* 8.3* 7.1*  MG 1.5* 2.0  --   --   --  1.2*   Liver Function Tests: Recent Labs  Lab 01/17/23 0452  AST 13*  ALT 14  ALKPHOS 106  BILITOT 0.7  PROT 4.4*  ALBUMIN 1.7*   No results for input(s): "LIPASE", "AMYLASE" in the last 168 hours. Recent Labs  Lab 01/13/23 0453  AMMONIA 18   Coagulation Profile: No results for input(s): "INR", "PROTIME" in the last 168 hours. CBC: Recent Labs  Lab 01/11/23 0518 01/13/23 0453 01/16/23 0428 01/17/23 0452  WBC 7.0 5.3 10.8* 12.7*  HGB 10.1* 9.6* 9.9* 8.2*  HCT 33.6* 30.6* 32.6* 25.4*  MCV 88.2 85.0 85.6 84.1  PLT 222 214 262 233   Cardiac Enzymes: No results for input(s): "CKTOTAL", "CKMB", "CKMBINDEX", "TROPONINI" in the last 168 hours. BNP: Invalid input(s): "POCBNP" CBG: Recent Labs  Lab 01/16/23 0736 01/16/23 1215 01/16/23 1642 01/16/23 2119 01/17/23 0741  GLUCAP 101* 89 91 104* 89   HbA1C: No results for input(s): "HGBA1C" in the last 72 hours. Urine analysis:    Component Value Date/Time   COLORURINE AMBER (A) 01/14/2023 0630   APPEARANCEUR CLOUDY (A) 01/14/2023 0630   LABSPEC 1.012 01/14/2023 0630   PHURINE 5.0 01/14/2023 0630   GLUCOSEU NEGATIVE 01/14/2023 0630   HGBUR MODERATE (A) 01/14/2023 0630   BILIRUBINUR NEGATIVE 01/14/2023 0630   KETONESUR NEGATIVE 01/14/2023 0630   PROTEINUR 30 (A) 01/14/2023 0630   NITRITE POSITIVE (A) 01/14/2023 0630   LEUKOCYTESUR  LARGE (A) 01/14/2023 0630   Sepsis Labs: @LABRCNTIP (procalcitonin:4,lacticidven:4) ) Recent Results (from the past 240 hour(s))  SARS Coronavirus 2 by RT PCR (hospital order, performed in [facility] hospital lab) *cepheid single result test* Anterior Nasal Swab     Status: None   Collection Time: 01/08/23 12:18 PM   Specimen: Anterior Nasal Swab  Result Value Ref Range Status   SARS Coronavirus 2 by RT PCR NEGATIVE NEGATIVE Final    Comment: (NOTE) SARS-CoV-2 target nucleic acids are NOT DETECTED.  The SARS-CoV-2 RNA is generally detectable in upper and lower respiratory specimens during the acute phase of infection. The lowest concentration of SARS-CoV-2 viral copies this assay can detect is 250 copies / mL. A negative result  does not preclude SARS-CoV-2 infection and should not be used as the sole basis for treatment or other patient management decisions.  A negative result may occur with improper specimen collection / handling, submission of specimen other than nasopharyngeal swab, presence of viral mutation(s) within the areas targeted by this assay, and inadequate number of viral copies (<250 copies / mL). A negative result must be combined with clinical observations, patient history, and epidemiological information.  Fact Sheet for Patients:   RoadLapTop.co.za  Fact Sheet for Healthcare Providers: http://kim-miller.com/  This test is not yet approved or  cleared by the [place] FDA and has been authorized for detection and/or diagnosis of SARS-CoV-2 by FDA under an Emergency Use Authorization (EUA).  This EUA will remain in effect (meaning this test can be used) for the duration of the COVID-19 declaration under Section 564(b)(1) of the Act, 21 U.S.C. section 360bbb-3(b)(1), unless the authorization is terminated or revoked sooner.  Performed at [facility], [address], [place], [place] [address]   MRSA Next Gen by  PCR, Nasal     Status: None   Collection Time: 01/08/23  5:50 PM   Specimen: Nasal Mucosa; Nasal Swab  Result Value Ref Range Status   MRSA by PCR Next Gen NOT DETECTED NOT DETECTED Final    Comment: (NOTE) The GeneXpert MRSA Assay (FDA approved for NASAL specimens only), is one component of a comprehensive MRSA colonization surveillance program. It is not intended to diagnose MRSA infection nor to guide or monitor treatment for MRSA infections. Test performance is not FDA approved in patients less than 33 years old. Performed at [facility], [address], [place], [place] [address]   Gastrointestinal Panel by PCR , Stool     Status: None   Collection Time: 01/08/23  6:11 PM   Specimen: Stool  Result Value Ref Range Status   Campylobacter species NOT DETECTED NOT DETECTED Final   Plesimonas shigelloides NOT DETECTED NOT DETECTED Final   Salmonella species NOT DETECTED NOT DETECTED Final   Yersinia enterocolitica NOT DETECTED NOT DETECTED Final   Vibrio species NOT DETECTED NOT DETECTED Final   Vibrio cholerae NOT DETECTED NOT DETECTED Final   Enteroaggregative E coli (EAEC) NOT DETECTED NOT DETECTED Final   Enteropathogenic E coli (EPEC) NOT DETECTED NOT DETECTED Final   Enterotoxigenic E coli (ETEC) NOT DETECTED NOT DETECTED Final   Shiga like toxin producing E coli (STEC) NOT DETECTED NOT DETECTED Final   Shigella/Enteroinvasive E coli (EIEC) NOT DETECTED NOT DETECTED Final   Cryptosporidium NOT DETECTED NOT DETECTED Final   Cyclospora cayetanensis NOT DETECTED NOT DETECTED Final   Entamoeba histolytica NOT DETECTED NOT DETECTED Final   Giardia lamblia NOT DETECTED NOT DETECTED Final   Adenovirus F40/41 NOT DETECTED NOT DETECTED Final   Astrovirus NOT DETECTED NOT DETECTED Final   Norovirus GI/GII NOT DETECTED NOT DETECTED Final   Rotavirus A NOT DETECTED NOT DETECTED Final   Sapovirus (I, II, IV, and V) NOT DETECTED NOT DETECTED Final    Comment: Performed at [facility], [address] [place], [place] [address]  C Difficile Quick Screen w PCR reflex     Status: None   Collection Time: 01/08/23  6:15 PM   Specimen: Stool  Result Value Ref Range Status   C Diff antigen NEGATIVE NEGATIVE Final   C Diff toxin NEGATIVE NEGATIVE Final   C Diff interpretation No C. difficile detected.  Final    Comment: Performed at [facility], [phone]  [address], [place], [place] [address]  Urine Culture     Status: Abnormal   Collection Time: 01/14/23  6:30 AM   Specimen: Urine, Random  Result Value Ref Range Status   Specimen Description   Final    URINE, RANDOM Performed at [facility], [address] [place], [place] [address]    Special Requests   Final    NONE Reflexed from [id number] Performed at [facility], [address] [place], [place] [address]    Culture (A)  Final    >=100,000 COLONIES/mL ESCHERICHIA COLI Confirmed Extended Spectrum Beta-Lactamase Producer (ESBL).  In bloodstream infections from ESBL organisms, carbapenems are preferred over piperacillin/tazobactam. They are shown to have a lower risk of mortality.    Report Status 01/16/2023 FINAL  Final   Organism ID, Bacteria ESCHERICHIA COLI (A)  Final      Susceptibility   Escherichia coli - MIC*    AMPICILLIN >=32 RESISTANT Resistant     CEFAZOLIN >=64 RESISTANT Resistant     CEFEPIME >=32 RESISTANT Resistant     CEFTRIAXONE >=64 RESISTANT Resistant     CIPROFLOXACIN >=4 RESISTANT Resistant     GENTAMICIN >=16 RESISTANT Resistant     IMIPENEM <=0.25 SENSITIVE Sensitive     NITROFURANTOIN <=16 SENSITIVE Sensitive     TRIMETH/SULFA >=320 RESISTANT Resistant     AMPICILLIN/SULBACTAM 16 INTERMEDIATE Intermediate     PIP/TAZO <=4 SENSITIVE Sensitive     * >=100,000 COLONIES/mL ESCHERICHIA COLI  Culture, blood (Routine X 2) w Reflex to ID Panel     Status: None (Preliminary result)   Collection Time: 01/16/23  8:03 AM   Specimen: Right Antecubital; Blood  Result Value Ref Range Status    Specimen Description   Final    RIGHT ANTECUBITAL BOTTLES DRAWN AEROBIC AND ANAEROBIC   Special Requests   Final    Blood Culture results may not be optimal due to an excessive volume of blood received in culture bottles   Culture   Final    NO GROWTH < 24 HOURS Performed at [facility], [address], [place], [place] [address]    Report Status PENDING  Incomplete  Culture, blood (Routine X 2) w Reflex to ID Panel     Status: None (Preliminary result)   Collection Time: 01/16/23  8:03 AM   Specimen: BLOOD RIGHT HAND  Result Value Ref Range Status   Specimen Description   Final    BLOOD RIGHT HAND BOTTLES DRAWN AEROBIC AND ANAEROBIC   Special Requests   Final    Blood Culture results may not be optimal due to an excessive volume of blood received in culture bottles   Culture   Final    NO GROWTH < 24 HOURS Performed at [facility], [address], [place], [place] [address]    Report Status PENDING  Incomplete     Scheduled Meds:  (feeding supplement) PROSource Plus  30 mL Oral BID BM   antiseptic oral rinse  15 mL Mouth Rinse BID   Chlorhexidine Gluconate Cloth  6 each Topical Daily   colchicine  0.6 mg Oral Daily   DULoxetine  60 mg Oral Daily   enoxaparin (LOVENOX) injection  40 mg Subcutaneous Q24H   feeding supplement  237 mL Oral BID BM   feeding supplement (GLUCERNA SHAKE)  237 mL Oral TID BM   ferrous sulfate  325 mg Oral Q breakfast   folic acid  1 mg Intravenous Daily   multivitamin with minerals  1 tablet  Oral Daily   oxybutynin  15 mg Oral QHS   pantoprazole (PROTONIX) IV  40 mg Intravenous Q12H   sodium chloride flush  10-40 mL Intracatheter Q12H   Continuous Infusions:  magnesium sulfate bolus IVPB 2 g (01/17/23 1424)   piperacillin-tazobactam (ZOSYN)  IV 3.375 g (01/17/23 0556)   sodium bicarbonate 150 mEq in dextrose 5 % 1,150 mL infusion 75 mL/hr at 01/16/23 1627   thiamine (VITAMIN B1) injection 500 mg (01/17/23 0918)    Procedures/Studies: [place] EKG  SITE RITE  Result Date: 01/16/2023 If Site Rite image not attached, placement could not be confirmed due to current cardiac rhythm.  CT CHEST W CONTRAST  Result Date: 01/15/2023 CLINICAL DATA:  Questioning esophageal mass. EXAM: CT CHEST WITH CONTRAST TECHNIQUE: Multidetector CT imaging of the chest was performed during intravenous contrast administration. RADIATION DOSE REDUCTION: This exam was performed according to the departmental dose-optimization program which includes automated exposure control, adjustment of the mA and/or kV according to patient size and/or use of iterative reconstruction technique. CONTRAST:  75mL OMNIPAQUE IOHEXOL 300 MG/ML  SOLN COMPARISON:  Esophagram 01/14/2023 FINDINGS: Cardiovascular: No significant vascular findings. Normal heart size. No pericardial effusion. Mediastinum/Nodes: No enlarged mediastinal, hilar, or axillary lymph nodes. Thyroid gland, trachea, and esophagus demonstrate no significant findings. Lungs/Pleura: There is minimal atelectasis in the left lower lobe. The lungs are otherwise clear. There is no pleural effusion or pneumothorax. Upper Abdomen: No acute findings. Rounded 2 point 0 cm left renal hypodensity likely represents a cyst or complex cyst. There are postsurgical changes in the stomach. Musculoskeletal: No chest wall abnormality. No acute or significant osseous findings. IMPRESSION: 1. No acute cardiopulmonary process. 2. Minimal atelectasis in the left lower lobe. Electronically Signed   By: Darliss Cheney M.D.   On: 01/15/2023 22:29   DG ESOPHAGUS W SINGLE CM (SOL OR THIN BA)  Result Date: 01/14/2023 CLINICAL DATA:  Patient with a history of dysphagia. EXAM: ESOPHAGUS/BARIUM SWALLOW/TABLET STUDY TECHNIQUE: Single contrast examination was performed using thin liquid barium. This exam was performed by Alwyn Ren, NP, and was supervised and interpreted by Rad . FLUOROSCOPY: Fluoro time: 1.12 sec COMPARISON:  None Available. FINDINGS:  Swallowing: Significant delay in swallow initiation/mechanism. No vestibular penetration or aspiration seen. Pharynx: Unremarkable. Esophagus: Prominent cricopharyngeal bar. Small diverticulum in the lower esophagus. Questionable extrinsic compression along the anterior aspect of the distal esophagus, best seen on image 45 of series 4. Esophageal motility: Advanced dysmotility with spasms. Hiatal Hernia: None. Gastroesophageal reflux: None visualized. Ingested 13 mm barium tablet: Not given. Patient needs pills crushed. Other: None. IMPRESSION: 1. 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 Further evaluation with endoscopy is recommended. 2. Significant delay in swallow initiation/mechanism. Prominent cricopharyngeal bar. 3. Advanced esophageal dysmotility with spasms. 4. Limited study due to patient's body habitus and mobility limitations. Electronically Signed   By: Acquanetta Belling M.D.   On: 01/14/2023 13:39   US Abdomen Complete  Result Date: 01/09/2023 CLINICAL DATA:  [id number] Gallbladder dilatation [id number] Generalized abdominal pain [id number] EXAM: ABDOMEN ULTRASOUND COMPLETE COMPARISON:  None Available. FINDINGS: Gallbladder: Gallbladder appears distended. Echogenic structure in the gallbladder is consistent with sludge. No shadowing gallstones or wall thickening visualized. No sonographic Murphy sign noted by sonographer. Common bile duct: Diameter: 0.4cm. Liver: No focal lesion identified. Hyperechoic consistent with fatty infiltration. IVC: No abnormality visualized. Pancreas: Obscured by bowel gas. Spleen: Size and appearance within normal limits. Right Kidney: Length: 10.6 cm. Echogenicity  increased consistent with chronic medical renal disease. No mass or hydronephrosis visualized. No shadowing stones. Left Kidney: Length: 9.6cm. Echogenicity increased consistent with chronic medical renal disease. Cyst measures  5.5 cm. No follow up recommended based on the ultrasound appearance of the cyst. No solid mass or hydronephrosis visualized. No shadowing stones. Abdominal aorta: Obscured by bowel gas. IMPRESSION: 1. Hepatic fatty infiltration. 2. Gallbladder distended. 3. Minimal gallbladder sludge. 4. Echogenic kidneys consistent with chronic medical renal disease. 5. Left kidney cyst. 6. There was limited visualization, as described. Electronically Signed   By: Layla Maw M.D.   On: 01/09/2023 11:17   CT ABDOMEN PELVIS WO CONTRAST  Result Date: 01/08/2023 CLINICAL DATA:  Abdominal pain. EXAM: CT ABDOMEN AND PELVIS WITHOUT CONTRAST TECHNIQUE: Multidetector CT imaging of the abdomen and pelvis was performed following the standard protocol without IV contrast. RADIATION DOSE REDUCTION: This exam was performed according to the departmental dose-optimization program which includes automated exposure control, adjustment of the mA and/or kV according to patient size and/or use of iterative reconstruction technique. COMPARISON:  CT 10/15/2022 and older FINDINGS: Evaluation significant limited by motion despite multiple attempts. As per the CT technologist, the patient had difficulty with the breathing instructions. Lower chest: Lung bases are limited by motion. There is some basilar atelectasis. Coronary artery calcifications are seen. Hepatobiliary: Grossly preserved parenchyma. Gallbladder is dilated. Pancreas: No obvious pancreatic mass. Spleen: Spleen is nonenlarged. Adrenals/Urinary Tract: Nodularity of the adrenal glands, poorly defined with the motion. No renal collecting system dilatation. No obvious obstructing stones. There is some cystic lesions in the left kidney including a larger lower pole focus with some septations and calcifications. Again evaluation is limited but appears similar to previous. Mildly distended urinary bladder. Stomach/Bowel: Surgical changes gastric bypass. The stomach, small and large bowel  are nondilated. There are some scattered stool. Details of the bowel are limited. Vascular/Lymphatic: Scattered vascular calcifications. No obvious dilatation of the abdominal aorta. IVC is nondilated. No obvious lymph node enlargement. Reproductive: Status post hysterectomy. No adnexal masses. Other: Markedly limited by motion. No obvious fluid collections. No large amounts of free air. Musculoskeletal: Scattered degenerative changes of the spine and pelvis. Overall moderate to severe. IMPRESSION: Distended gallbladder. If there is concern of gallbladder pathology ultrasound may be useful as the next step in the workup. Grossly stable left-sided renal cystic foci. Surgical changes of previous gastric bypass Markedly limited examination due to the level of motion. No obvious obstruction, free air or free fluid. In addition, a repeat study could be considered when the patient is more clinically able. Electronically Signed   By: Karen Kays M.D.   On: 01/08/2023 13:41   CT Head Wo Contrast  Result Date: 01/08/2023 CLINICAL DATA:  Altered mental status, weakness for 3 days, confusion EXAM: CT HEAD WITHOUT CONTRAST TECHNIQUE: Contiguous axial images were obtained from the base of the skull through the vertex without intravenous contrast. RADIATION DOSE REDUCTION: This exam was performed according to the departmental dose-optimization program which includes automated exposure control, adjustment of the mA and/or kV according to patient size and/or use of iterative reconstruction technique. COMPARISON:  01/03/2023 FINDINGS: Brain: No evidence of acute infarction, hemorrhage, hydrocephalus, extra-axial collection or mass lesion/mass effect. Mild periventricular white matter hypodensity. Vascular: No hyperdense vessel or unexpected calcification. Skull: Normal. Negative for fracture or focal lesion. Sinuses/Orbits: No acute finding. Other: None. IMPRESSION: No acute intracranial pathology.  Small-vessel white matter  disease. Electronically Signed   By: Jearld Lesch M.D.   On: 01/08/2023  13:38   DG Chest Port 1 View  Result Date: 01/08/2023 CLINICAL DATA:  Altered mental status. EXAM: PORTABLE CHEST 1 VIEW COMPARISON:  01/03/2023 FINDINGS: Low lung volumes. Similar streaky density in the retrocardiac medial left base, likely vascular anatomy. No focal consolidation, pulmonary edema, or pleural effusion. Cardiopericardial silhouette is at upper limits of normal for size. No acute bony abnormality. Telemetry leads overlie the chest. IMPRESSION: No active disease. Electronically Signed   By: Kennith Center M.D.   On: 01/08/2023 12:23    Catarina Hartshorn, DO  [facility]  If 7PM-7AM, please contact night-coverage [email or web address] Password [email or web address] 01/17/2023, 2:24 PM   LOS: 9 days

## 2023-01-17 NOTE — TOC Progression Note (Signed)
Transition of Care [facility]) - Progression Note    Patient Details  Name: Christine [name] MRN: [id number] Date of Birth: Mar 28, 1951  Transition [facility]) CM/SW Contact  Villa Herb, [place] Phone Number: 01/17/2023, 9:40 AM  Clinical Narrative:    CSW updated by MD that [facility] can be canceled at this time for SNF and can resume closer to when pt is medically ready. CSW updated Whitney in central intake with [facility] who will cancel auth at this time. TOC to follow.   Expected Discharge Plan: Skilled Nursing Facility Barriers to Discharge: Continued Medical Work up  Expected Discharge Plan and Services In-house Referral: Clinical Social Work Discharge Planning Services: CM Consult Post Acute Care Choice: Skilled Nursing Facility Living arrangements for the past 2 months: Skilled Nursing Facility                                       Social Determinants of Health (SDOH) Interventions SDOH Screenings   Food Insecurity: Patient Unable To Answer (01/08/2023)  Housing: Patient Declined (01/08/2023)  Transportation Needs: Patient Unable To Answer (01/08/2023)  Utilities: Patient Unable To Answer (01/08/2023)  Alcohol Screen: Low Risk  (07/13/2022)  Depression (PHQ2-9): Low Risk  (01/21/2022)  Financial Resource Strain: Low Risk  (12/01/2022)   Received from [facility]  Physical Activity: Insufficiently Active (07/13/2022)  Social Connections: Moderately Integrated (12/01/2022)   Received from [facility]  Stress: No Stress Concern Present (07/13/2022)  Tobacco Use: Medium Risk (01/08/2023)  Health Literacy: Medium Risk (12/01/2022)   Received from [facility]    Readmission Risk Interventions    01/09/2023   11:11 AM  Readmission Risk Prevention Plan  Transportation Screening Complete  HRI or Home Care Consult Complete  Social Work Consult for Recovery Care Planning/Counseling Complete  Palliative Care Screening Not Applicable  Medication Review Furniture conservator/restorer) Complete

## 2023-01-17 NOTE — Progress Notes (Signed)
Despite several attempts to contact the patient, we were unsuccessful.

## 2023-01-18 DIAGNOSIS — G9341 Metabolic encephalopathy: Secondary | ICD-10-CM | POA: Diagnosis not present

## 2023-01-18 DIAGNOSIS — R627 Adult failure to thrive: Secondary | ICD-10-CM | POA: Diagnosis not present

## 2023-01-18 DIAGNOSIS — I10 Essential (primary) hypertension: Secondary | ICD-10-CM

## 2023-01-18 DIAGNOSIS — N179 Acute kidney failure, unspecified: Secondary | ICD-10-CM | POA: Diagnosis not present

## 2023-01-18 DIAGNOSIS — Z515 Encounter for palliative care: Secondary | ICD-10-CM | POA: Diagnosis not present

## 2023-01-18 DIAGNOSIS — Z7189 Other specified counseling: Secondary | ICD-10-CM | POA: Diagnosis not present

## 2023-01-18 DIAGNOSIS — R1319 Other dysphagia: Secondary | ICD-10-CM | POA: Diagnosis not present

## 2023-01-18 DIAGNOSIS — E870 Hyperosmolality and hypernatremia: Secondary | ICD-10-CM | POA: Diagnosis not present

## 2023-01-18 LAB — GLUCOSE, CAPILLARY
Glucose-Capillary: 89 mg/dL (ref 70–99)
Glucose-Capillary: 59 mg/dL — ABNORMAL LOW (ref 70–99)
Glucose-Capillary: 66 mg/dL — ABNORMAL LOW (ref 70–99)
Glucose-Capillary: 76 mg/dL (ref 70–99)

## 2023-01-18 LAB — CBC
HCT: 25.7 % — ABNORMAL LOW (ref 36.0–46.0)
Hemoglobin: 8.3 g/dL — ABNORMAL LOW (ref 12.0–15.0)
MCH: 26.4 pg (ref 26.0–34.0)
MCHC: 32.3 g/dL (ref 30.0–36.0)
MCV: 81.8 fL (ref 80.0–100.0)
Platelets: 280 10*3/uL (ref 150–400)
RBC: 3.14 MIL/uL — ABNORMAL LOW (ref 3.87–5.11)
RDW: 21.7 % — ABNORMAL HIGH (ref 11.5–15.5)
WBC: 8.2 10*3/uL (ref 4.0–10.5)
nRBC: 0 % (ref 0.0–0.2)

## 2023-01-18 LAB — BASIC METABOLIC PANEL
Anion gap: 9 (ref 5–15)
BUN: 20 mg/dL (ref 8–23)
CO2: 20 mmol/L — ABNORMAL LOW (ref 22–32)
Calcium: 7.6 mg/dL — ABNORMAL LOW (ref 8.9–10.3)
Chloride: 111 mmol/L (ref 98–111)
Creatinine, Ser: 1.22 mg/dL — ABNORMAL HIGH (ref 0.44–1.00)
GFR, Estimated: 48 mL/min — ABNORMAL LOW (ref >60)
Glucose, Bld: 76 mg/dL (ref 70–99)
Potassium: 3.3 mmol/L — ABNORMAL LOW (ref 3.5–5.1)
Sodium: 140 mmol/L (ref 135–145)

## 2023-01-18 LAB — PHOSPHORUS: Phosphorus: 1.3 mg/dL — ABNORMAL LOW (ref 2.5–4.6)

## 2023-01-18 LAB — MAGNESIUM: Magnesium: 1.9 mg/dL (ref 1.7–2.4)

## 2023-01-18 MED ORDER — DEXTROSE 50 % IV SOLN
50.0000 mL | INTRAVENOUS | Status: AC
  Administered 2023-01-18: 50 mL via INTRAVENOUS

## 2023-01-18 MED ORDER — DEXTROSE 50 % IV SOLN
INTRAVENOUS | Status: AC
  Filled 2023-01-18: qty 50

## 2023-01-18 MED ORDER — THIAMINE HCL 100 MG/ML IJ SOLN: 100.0000 mg | INTRAMUSCULAR | Status: DC

## 2023-01-18 MED ORDER — POTASSIUM CHLORIDE 10 MEQ/100ML IV SOLN
10.0000 meq | INTRAVENOUS | Status: DC
Start: 2023-01-18 — End: 2023-01-18

## 2023-01-18 MED ORDER — THIAMINE HCL 100 MG/ML IJ SOLN
100.0000 mg | Freq: Every day | INTRAMUSCULAR | Status: DC
  Administered 2023-01-19 – 2023-01-25 (×7): 100 mg via INTRAVENOUS
  Filled 2023-01-18 (×7): qty 2

## 2023-01-18 MED ORDER — DEXTROSE 50 % IV SOLN: 25.0000 mL | INTRAVENOUS | Status: DC

## 2023-01-18 MED ORDER — POTASSIUM CHLORIDE 10 MEQ/100ML IV SOLN
10.0000 meq | INTRAVENOUS | Status: AC
  Administered 2023-01-18 (×2): 10 meq via INTRAVENOUS
  Filled 2023-01-18 (×2): qty 100

## 2023-01-18 MED ORDER — FENTANYL CITRATE PF 50 MCG/ML IJ SOSY
25.0000 ug | PREFILLED_SYRINGE | INTRAMUSCULAR | Status: DC | PRN
  Administered 2023-01-23: 25 ug via INTRAVENOUS
  Filled 2023-01-18: qty 1

## 2023-01-18 MED ORDER — POTASSIUM PHOSPHATES 15 MMOLE/5ML IV SOLN
30.0000 mmol | Freq: Once | INTRAVENOUS | Status: AC
  Administered 2023-01-18: 30 mmol via INTRAVENOUS
  Filled 2023-01-18: qty 10

## 2023-01-18 NOTE — Progress Notes (Signed)
Nutrition Follow-up  DOCUMENTATION CODES:   Not applicable  INTERVENTION:   When G-tube is placed, recommend: Osmolite 1.5 at 20 ml/h, increase by 10 ml every 8 hours to goal rate of 50 ml/h (1200 ml per day). Prosource TF20 60 ml BID.  Provides 1960 kcal, 115 gm protein, 914 ml free water daily.  Need to replete K and Phos and monitor for refeeding syndrome by monitoring magnesium, potassium, and phosphorus BID for at least 3 days, MD to replete as needed, as pt is at risk for refeeding syndrome given recent minimal intake.  When TF is tolerated at goal, will need to resume bariatric vitamins.  NUTRITION DIAGNOSIS:   Increased nutrient needs related to wound healing as evidenced by estimated needs.  Ongoing   GOAL:   Patient will meet greater than or equal to 90% of their needs  Unmet, currently NPO  MONITOR:   PO intake, Supplement acceptance  REASON FOR ASSESSMENT:   Malnutrition Screening Tool    ASSESSMENT:   72 yo female admitted with severe dehydration, AKI. 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  Patient receiving nursing care, unable to speak with her.  Patient has been having some difficulty swallowing. Intake of meals and supplements has been minimal. She has been confused and refusing to eat and drink. Also refusing some medications. Currently NPO. Plans for transfer to [facility] for G-tube placement.    Labs reviewed. K 3.3, phos 1.3 CBG: [phone]  Medications reviewed and include ferrous sulfate, folic acid injection, MVI with minerals, thiamine, KCl, KPhos.  Weight is trending up d/t edema. +2 moderate pitting generalized edema and non-pitting BLE edema documented by RN 9/23.   Diet Order:   Diet Order             Diet NPO time specified Except for: Sips with Meds  Diet effective midnight                   EDUCATION NEEDS:   Education needs have been addressed  Skin:  Skin Assessment: Skin  Integrity Issues: Skin Integrity Issues:: Stage II, Stage III Stage II: coccyx, 8 areas to L & R buttocks, L thigh Stage III: 2 areas to R buttocks  Last BM:  9/24  Height:   Ht Readings from Last 1 Encounters:  01/08/23 5\' 1"  (1.549 m)    Weight:   Wt Readings from Last 1 Encounters:  01/18/23 104.5 kg  01/09/23  101.5 kg (admission wt)  BMI:  Body mass index is 43.53 kg/m.  Estimated Nutritional Needs:   Kcal:  1700-2000  Protein:  100-120 gm  Fluid:  1.5-2 L   Gabriel Rainwater RD, LDN, CNSC Please refer to Amion for contact information.

## 2023-01-18 NOTE — Progress Notes (Signed)
Palliative:  [name] Christine [name], Christine [name], is lying quietly in bed.  She appears acutely/chronically ill and frail.  She is resting comfortably and greets me as I enter.  She will make an somewhat keep eye contact.  She is oriented to self only, unable to tell me where we are or the month.  I do believe that she can make her needs known.  There is no family at bedside at this time.  We talked about the plan for transfer to [facility] for PEG tube placement today.  Christine Christine [name] Christine [name] states that she is aware of this and is agreeable to PEG tube placement.  No questions at this time.  Bedside nursing staff arrives to attend to needs.  Face-to-face discussion with nursing staff.  Call to sister/HCPOA, Christine Christine [name] Christine [name].  We talk about the plan for Christine Christine [name] Christine [name] to transfer to [facility] for PEG tube placement.  Christine Christine [name] Christine [name] states that she spoke with attending yesterday and understands the plan.  We talked about time for outcomes.  No questions at this time.  Conference with attending, bedside nursing staff, transition of care team related to patient condition, needs, goals of care, disposition.  Plan: At this point continue full scope/full code.  [facility] for PEG tube placed by bariatric surgeon.  Time for outcomes.  PMT to follow.  50 minutes  Christine Carmel, NP Palliative Medicine Team  Team Phone [phone] Greater than 50% of this time was spent counseling and coordinating care related to the above assessment and plan.

## 2023-01-18 NOTE — Progress Notes (Signed)
Transferred to [facility] via EMS for feeding tube placement.

## 2023-01-18 NOTE — Progress Notes (Signed)
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 Dossie Der.  She has been progressively declining since surgery.  She has had multiple hospitalizations and ED visits in past several months.  Recently seen at [facility] on 01/03/23 with complaints of weakness and poor oral intake.  She was hospitalized in July and August of this year at different facilities for dehydration, weakness, acute renal failure and failure to thrive.  She has been residing in a SNF.  She apparently has not been eating or drinking and refusing to take her medications.  She was sent to ER today for evaluation of altered mentation.  She has had 3 days of progressive weakness and refusing oral intake.  She complains of pain all over body which is not new per record review. Pt is a poor historian.   She was found to be severely dehydrated with hypoglycemia and hypernatremic with a sodium of 157.  She also had acute kidney injury and distended gallbladder on CT scan.  CT head with no acute findings.  Pt was started on IV hydration and admission was requested.    PT Comments  Patient appears slightly confused and agreeable for therapy after encouragement.  Patient demonstrates slow labored movement for sitting up at bedside requiring frequent rest breaks, easily agitated and c/o severe pain LLE and over buttocks when scooting to EOB.  Patient able to scoot to bedside with LLE touching the floor, but unable to attempt sit to stands or transfers due to weakness and severe apprehension.  Patient required 2 person assist for putting back to bed mostly due to behavior.  Patient will benefit from continued skilled physical therapy in hospital and recommended  venue below to increase strength, balance, endurance for safe ADLs and gait.      If plan is discharge home, recommend the following: A lot of help with walking and/or transfers;A lot of help with bathing/dressing/bathroom;Help with stairs or ramp for entrance;Assistance with cooking/housework   Can travel by private vehicle     No  Equipment Recommendations  Rolling walker (2 wheels)    Recommendations for Other Services       Precautions / Restrictions Precautions Precautions: Fall Restrictions Weight Bearing Restrictions: No     Mobility  Bed Mobility Overal bed mobility: Needs Assistance Bed Mobility: Supine to Sit, Sit to Supine     Supine to sit: Min assist Sit to supine: Mod assist, Max assist   General bed mobility comments: slow labored movement, required frequent rest breaks, easily agitated    Transfers                        Ambulation/Gait                   Stairs             Wheelchair Mobility     Tilt Bed    Modified Rankin (Stroke Patients Only)       Balance Overall balance assessment: Needs assistance Sitting-balance support: Feet supported, No upper extremity supported Sitting balance-Leahy Scale: Fair Sitting balance - Comments: fair/good seated at EOB  Cognition Arousal: Alert Behavior During Therapy: Agitated, Anxious Overall Cognitive Status: No family/caregiver present to determine baseline cognitive functioning                                 General Comments: requires much time to respond to answers and following directions        Exercises General Exercises - Lower Extremity Ankle Circles/Pumps: Supine, 10 reps, Strengthening, AROM, Both    General Comments        Pertinent Vitals/Pain Pain Assessment Pain Assessment: Faces Faces Pain Scale: Hurts even more Pain Location: LLE and over bottom when scooting to edge of  bed Pain Descriptors / Indicators: Sore, Grimacing, Discomfort, Moaning Pain Intervention(s): Limited activity within patient's tolerance, Monitored during session, Repositioned    Home Living                          Prior Function            PT Goals (current goals can now be found in the care plan section) Acute Rehab PT Goals Patient Stated Goal: return home with family to assist PT Goal Formulation: With patient/family Time For Goal Achievement: 01/25/23 Potential to Achieve Goals: Good Progress towards PT goals: Progressing toward goals    Frequency    Min 3X/week      PT Plan      Co-evaluation              AM-PAC PT "6 Clicks" Mobility   Outcome Measure  Help needed turning from your back to your side while in a flat bed without using bedrails?: A Little Help needed moving from lying on your back to sitting on the side of a flat bed without using bedrails?: A Lot Help needed moving to and from a bed to a chair (including a wheelchair)?: Total Help needed standing up from a chair using your arms (e.g., wheelchair or bedside chair)?: Total Help needed to walk in hospital room?: Total Help needed climbing 3-5 steps with a railing? : Total 6 Click Score: 9    End of Session   Activity Tolerance: Patient tolerated treatment well;Patient limited by fatigue Patient left: in bed;with call bell/phone within reach;with nursing/sitter in room Nurse Communication: Mobility status PT Visit Diagnosis: Unsteadiness on feet (R26.81);Other abnormalities of gait and mobility (R26.89);Muscle weakness (generalized) (M62.81)     Time: 1020-1056 PT Time Calculation (min) (ACUTE ONLY): 36 min  Charges:    $Therapeutic Activity: 23-37 mins PT General Charges $$ ACUTE PT VISIT: 1 Visit                     1:43 PM, 01/18/23 Ocie Bob, MPT Physical Therapist with [facility] [phone] office [phone] mobile phone

## 2023-01-18 NOTE — Progress Notes (Signed)
SLP Cancellation Note  Patient Details Name: Aleema Aumiller MRN: [id number] DOB: [birth year]   Cancelled treatment:       Reason Eval/Treat Not Completed: Patient at procedure or test/unavailable (Pt going to [facility] for PEG placement and is NPO pending this procedure.)  Thank you,  Havery Moros, CCC-SLP [phone]  Toan Mort 01/18/2023, 12:53 PM

## 2023-01-18 NOTE — Progress Notes (Signed)
Subjective: Difficult historian due to being confused. Reports trouble with initiating swallow but also with sensation of items getting hung in esophagus. Reports primary issues is trouble initiating her swallow. Reports this is chronic but worsening. Denies abdominal pain currently and doesn't remember having pain yesterday. Denies nausea or vomiting.    Objective: Vital signs in last 24 hours: Temp:  [98.2 F (36.8 C)-98.9 F (37.2 C)] 98.2 F (36.8 C) (09/24 0833) Pulse Rate:  [87-94] 93 (09/24 0833) Resp:  [19-20] 20 (09/24 0833) BP: (122-135)/(64-72) 135/72 (09/24 0833) SpO2:  [100 %] 100 % (09/24 0833) FiO2 (%):  [21 %] 21 % (09/24 0015) Weight:  [104.5 kg] 104.5 kg (09/24 0346) Last BM Date : 01/16/23 General:   Alert and oriented to self only.  Head:  Normocephalic and atraumatic. Eyes:  No icterus, sclera clear. Conjuctiva pink.  Abdomen:  Bowel sounds present, soft, non-distended. Mild to moderate TTP in epigastric and RUQ region. No HSM or hernias noted. No rebound or guarding. No masses appreciated  Msk:  Symmetrical without gross deformities. Normal posture. Pulses:  Normal pulses noted. Extremities:  Without trace edema with tenderness and slight erythema of LE bilaterally.  Neurologic:  Alert and  oriented x4;  grossly normal neurologically. Skin:  Warm and dry, intact without significant lesions.  Cervical Nodes:  No significant cervical adenopathy. Psych:  Normal mood and affect.  Intake/Output from previous day: 09/23 0701 - 09/24 0700 In: 1289.3 [I.V.:1109.1; IV Piggyback:180.2] Out: -  Intake/Output this shift: No intake/output data recorded.  Lab Results: Recent Labs    01/16/23 0428 01/17/23 0452 01/18/23 0433  WBC 10.8* 12.7* 8.2  HGB 9.9* 8.2* 8.3*  HCT 32.6* 25.4* 25.7*  PLT 262 233 280   BMET Recent Labs    01/16/23 0428 01/17/23 0452 01/18/23 0433  NA 140 138 140  K 3.4* 3.2* 3.3*  CL 117* 104 111  CO2 15* 28 20*  GLUCOSE 106*  397* 76  BUN 13 18 20   CREATININE 0.99 1.11* 1.22*  CALCIUM 8.3* 7.1* 7.6*   LFT Recent Labs    01/17/23 0452  PROT 4.4*  ALBUMIN 1.7*  AST 13*  ALT 14  ALKPHOS 106  BILITOT 0.7    Studies/Results: NM Hepato W/EF  Result Date: 01/17/2023 CLINICAL DATA:  Right upper quadrant abdominal pain. EXAM: NUCLEAR MEDICINE HEPATOBILIARY IMAGING WITH GALLBLADDER EF TECHNIQUE: Sequential images of the abdomen were obtained out to 60 minutes following intravenous administration of radiopharmaceutical. After slow intravenous infusion of 2.1 micrograms Cholecystokinin, gallbladder ejection fraction was determined. RADIOPHARMACEUTICALS:  5.5 mCi Tc-26m Choletec IV COMPARISON:  Ultrasound January 09, 2023. FINDINGS: Prompt uptake and biliary excretion of activity by the liver is seen. Gallbladder activity is visualized, consistent with patency of cystic duct. Biliary activity passes into small bowel, consistent with patent common bile duct. Calculated gallbladder ejection fraction is 98%. (At 60 min, normal ejection fraction is greater than 40% and less than 80%.) IMPRESSION: 1.  Patent cystic and common bile ducts. 2. Elevated gallbladder ejection fraction as can be seen with gallbladder hyperkinesis. Electronically Signed   By: Maudry Mayhew M.D.   On: 01/17/2023 16:37   [place] EKG SITE RITE  Result Date: 01/16/2023 If [facility] image not attached, placement could not be confirmed due to current cardiac rhythm.   Assessment: 72 y.o. female admitted with generalized weakness/fatigue, encephalopathy, metabolic derangements on admission, and reporting dysphagia both oropharyngeal and esophageal. Also with epigastric and RUQ tenderness on exam.   Dysphagia:  Multifactorial in setting of oropharyngeal and esophageal dysphagia.  Has significant trouble with initiating swallow.  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  CT chest without mass.  Last EGD December 2023 with erythematous mucosa in the antrum, multiple fundic gland polyps, single gastric polyp removed.    Had recommended repeat EGD today to evaluate abnormality seen on barium pill esophagram, but holding off per Dr. Don Perking request as he is planning to get patient to [facility] to see her bariatric surgeon with [facility] surgery for PEG tube placement.  States additional GI workup can be completed at [facility] if needed.   Epigastric and RUQ tenderness: No obvious abnormalities on CT abdomen pelvis though exam limited due to motion.  Abdominal ultrasound with distended gallbladder, minimal gallbladder sludge.  HIDA with patent cystic and common bile ducts, elevated gallbladder EF as can be seen with gallbladder hyperkinesis. Unclear if this is contributing to her pain, but per yesterday's note, she did have worsening pain with HIDA.   I had recommended EGD today to rule out other etiology including gastritis, duodenitis, PUD, H. Pylori, but holding off at the request of Dr. Arbutus Leas due to anticipated transfer to [facility] for PEG tube placement today.    Plan: Holding off on EGD at recommendation of Dr. Arbutus Leas in anticipation for transfer to [facility] for PEG tube placement.  Continue PPI BID.    LOS: 10 days    01/18/2023, 10:10 AM   Ermalinda Memos, [facility] Gastroenterology

## 2023-01-18 NOTE — Progress Notes (Addendum)
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 Dossie Der.  She has been progressively declining since surgery.  She has had multiple hospitalizations and ED visits in past several months.  Recently seen at [facility] on 01/03/23 with complaints of weakness and poor oral intake.  She was hospitalized in July and August of this year at different facilities for dehydration, weakness, acute renal failure and failure to thrive.  She has been residing in a SNF.  She apparently has not been eating or drinking and refusing to take her medications.  She was sent to ER today for evaluation of altered mentation.  She has had 3 days of progressive weakness and refusing oral intake.  She complains of pain all over body which is not new per record review. Pt is a poor historian.   She was found to be severely dehydrated with hypoglycemia and hypernatremic with a sodium of 157.  She also had acute kidney injury and distended gallbladder on CT scan.  CT head with no acute findings.  Pt was started on IV hydration and admission was requested.     The patient was started on IV hypotonic fluid with D5W with improvement of her electrolytes.  Her hypoglycemia improved on D5W.  Her electrolytes were optimized.  Her renal function gradually improved/stabilized.  In addition, the patient's mental status improved although she continues to have delayed responses.  Her hospitalization has been prolonged secondary to her deconditioning and patient's refusal for oral intake.  In addition, the patient continues to have failure to thrive.  She continues to have very poor oral intake.  On numerous occasions, the patient with " pocket" pills and food in her mouth and not swallow.  She  continued to show delayed swallowing.  Speech therapy was consulted and did not feel that there was a pharyngeal etiology of the patient's swallowing.  Esophagram showed esophageal dysmotility.  GI was consulted, but did not feel that there was a physiologic cause prohibiting the patient from swallowing.  Numerous discussions were held with the patient and family regarding the patient's lack of desire to eat and swallow her nutrition and pills.  Each day, nursing staff it has been an inordinate amount of time encouraging the patient.  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  GI was consulted to assist.  CT chest did not show any mass causing extrinsic compression on esophagus.  Again, it was felt there was no medically prohibitive reason why patient cannot swallow.  I had multiple discussions with the patient's brother and sister who are the patient's main advocates.  After numerous discussions, the patient and brother, and sister agreed to pursue gastrostomy tube placement.  Palliative medicine was consulted to discuss GOC. I spoke with general surgery, Dr. Dossie Der, who agreed to consult for possible gastrostomy tube placement.    Assessment/Plan: Hypernatremia / Severe Dehydration  - pt presented with severe clinical findings of free water deficit  - continue D5W infusion  - monitor sodium levels daily >>improved - change IVF to D51/2NS with KCl initially due to hypoglycemia -now on D5W with bicarbonate drip   Hypoglycemia/Failure  to Thrive - due poor oral intake and recent gastric bypass  - monitor CBG regularly - continue dextrose infusion >>rate decreased  -pt encouraged innumerable occasions to take po -D51/2NS turned off x 24 hours and pt has recurrent hypoglycemia -am cortisol 25.7 -TSH  1.715 -B12--1183 -folate 8.6   Adult Failure to Thrive/Esophageal dysmotility -Patient continues to have poor oral intake and no desire to eat -She continues to " pocket" her pills and food. -Nursing staff has spent an unwarranted amount of time each day encouraging the patient - pt's sister traveled by train from [place] to come here to visit and help with arrangements for post hospital care -9/20 Esophagram--advanced esophageal dysmotility but no obstruction.  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 causing extrinsic compression of the anterior distal esophageal -appreciate GI consult>>CT chest--LLL atelectasis, no mass, lungs clear -no medically prohibitive reason why patient cannot swallow -thiamine level ordered--result pending -given high dose thiamine -9/24--discussed with Dr. Royanne Foots (performed gastric bypass) agrees to consult for possible gastrostomy tube   UTI--ESBL E coli -9/20 UA>50 WBC -9/20 urine culture = MDR Ecoli -d/c ceftriaxone -continue merrem   Abdominal pain -9/14 CT abd--limited by motion, but  No obvious obstruction, free air or free fluid; s/p gastric bypass -9/15 Abd US--GB distended, minimal GB sludge -check LFTs-normal -HIDA scan--patent cystic duct and CBD, GB hyperkinetic -continue  protonix -overall improved   NAGMA -started bicarb drip   Hypokalemia/Hypomagnesemia/Hypophosphatemia - IV replacement ordered -added KCl to IVF   AKI  -baseline creatinine 0.6-1.0 -serum creatinine peaked 1.66 - prerenal from severe dehydration  - continue IV fluid hydration as ordered  - renal function improved-   Acute metabolic encephalopathy/Failure to Thrive - secondary to severe hypernatremia, AKI - mental status now back to baseline -B12--1183 -folate 11.5 -TSH 1.725 -ammonia = 18 -consult speech therapy--no evidence of oropharygeal dysphagia -recheck UA >50 WBC -intermittent episodes of delirium partly due  to hospital delirium and hypnotic meds   NAG Metabolic acidosis - added sodium bicarb tablets 9/16--pt not swallowing -01/16/23--started IV bicarb drip   OSA  - nightly CPAP ordered    Essential hypertension  - initially held home antihypertensives until she has been better rehydrated  - pt had been refusing all of her meds for past several days  - BPs now rebounding after hydration - restarted amlodipine 10 mg daily on 9/16 but pt refusing to take po -prn IV hydralazine   Morbid Obesity  - pt is postop s/p 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 Dossie Der. - her preop weight was 294.8 # and her 2nd postop visit she weighed 261.6# - current weight 226.2 #  - continue vitamin supplements if patient is willing to swallow pills   GERD - IV pantoprazole ordered for GI protection until she can take oral again   Sacral pressure wounds - noted below, appreciate wound care nursing team consultation     Goals of care -discussed with sister, brother, patient>>pt does not want gastrostomy tube -pt understands that there is nothing medically prohibitive affecting her swallowing and that she is able to eat -she acknowledges that she chooses not to chew and swallow -consult palliative medicine -numerous GOC discussions with pt and brother and sister who are patient's main advocate--not ready for full comfort -They are agreeable for gastrostomy tube placement for enteral feeding and plan DC to SNF for STR       Family Communication: sister and brother updated 9/24   Consultants:  general  surgery--[place];  palliative   Code Status:  FULL   DVT Prophylaxis:   [place] Lovenox     Procedures: As Listed in Progress Note Above   Antibiotics: Ceftriaxone 9/20>>9/22 Zosyn 9/22>>9/23 Merrem 9/23>>            Subjective: Patient denies fevers, chills, headache, chest pain, dyspnea, nausea, vomiting, diarrhea, abdominal pain, dysuria, hematuria, hematochezia,  and melena.   Objective: Vitals:   01/17/23 0052 01/17/23 0522 01/17/23 2015 01/18/23 0346  BP: (!) 154/86 133/75 122/69 122/64  Pulse: 94 87 94 87  Resp: 19 20 20 19   Temp: 98.7 F (37.1 C) 97.7 F (36.5 C) 98.9 F (37.2 C) 98.7 F (37.1 C)  TempSrc: Axillary Axillary Oral Oral  SpO2: 100% 100% 100% 100%  Weight:  104.4 kg  104.5 kg  Height:        Intake/Output Summary (Last 24 hours) at 01/18/2023 0746 Last data filed at 01/17/2023 1700 Gross per 24 hour  Intake 1289.34 ml  Output --  Net 1289.34 ml   Weight change: 0.1 kg Exam:  General:  Pt is alert, follows commands appropriately, not in acute distress HEENT: No icterus, No thrush, No neck mass, [place]/AT Cardiovascular: RRR, S1/S2, no rubs, no gallops Respiratory: CTA bilaterally, no wheezing, no crackles, no rhonchi Abdomen: Soft/+BS, non tender, non distended, no guarding Extremities: 1 + LE edema, No lymphangitis, No petechiae, No rashes, no synovitis   Data Reviewed: I have personally reviewed following labs and imaging studies Basic Metabolic Panel: Recent Labs  Lab 01/12/23 0444 01/13/23 0453 01/14/23 0432 01/15/23 0439 01/16/23 0428 01/17/23 0452 01/18/23 0433  NA 139 138 138 141 140 138 140  K 2.7* 3.6 4.0 3.8 3.4* 3.2* 3.3*  CL 112* 114* 113* 116* 117* 104 111  CO2 17* 17* 16* 16* 15* 28 20*  GLUCOSE 84 87 90 88 106* 397* 76  BUN 31* 23 19 14 13 18 20   CREATININE 1.11* 0.98 0.86 0.85 0.99 1.11* 1.22*  CALCIUM 8.3* 8.4* 8.5* 8.7* 8.3* 7.1* 7.6*  MG 1.5* 2.0  --   --   --  1.2* 1.9  PHOS  --   --   --   --   --   --  1.3*   Liver Function Tests: Recent Labs  Lab 01/17/23 0452  AST 13*  ALT 14  ALKPHOS 106  BILITOT 0.7  PROT 4.4*  ALBUMIN 1.7*   No results for input(s): "LIPASE", "AMYLASE" in the last 168 hours. Recent Labs  Lab 01/13/23 0453  AMMONIA 18   Coagulation Profile: No results for input(s): "INR", "PROTIME" in the last 168 hours. CBC: Recent Labs  Lab 01/13/23 0453  01/16/23 0428 01/17/23 0452 01/18/23 0433  WBC 5.3 10.8* 12.7* 8.2  HGB 9.6* 9.9* 8.2* 8.3*  HCT 30.6* 32.6* 25.4* 25.7*  MCV 85.0 85.6 84.1 81.8  PLT 214 262 233 280   Cardiac Enzymes: No results for input(s): "CKTOTAL", "CKMB", "CKMBINDEX", "TROPONINI" in the last 168 hours. BNP: Invalid input(s): "POCBNP" CBG: Recent Labs  Lab 01/16/23 1215 01/16/23 1642 01/16/23 2119 01/17/23 0741 01/17/23 1625  GLUCAP 89 91 104* 89 74   HbA1C: No results for input(s): "HGBA1C" in the last 72 hours. Urine analysis:    Component Value Date/Time   COLORURINE AMBER (A) 01/14/2023 0630   APPEARANCEUR CLOUDY (A) 01/14/2023 0630   LABSPEC 1.012 01/14/2023 0630   PHURINE 5.0 01/14/2023 0630   GLUCOSEU NEGATIVE 01/14/2023 0630   HGBUR MODERATE (A) 01/14/2023 0630  BILIRUBINUR NEGATIVE 01/14/2023 0630   KETONESUR NEGATIVE 01/14/2023 0630   PROTEINUR 30 (A) 01/14/2023 0630   NITRITE POSITIVE (A) 01/14/2023 0630   LEUKOCYTESUR LARGE (A) 01/14/2023 0630   Sepsis Labs: @LABRCNTIP (procalcitonin:4,lacticidven:4) ) Recent Results (from the past 240 hour(s))  SARS Coronavirus 2 by RT PCR (hospital order, performed in [facility] hospital lab) *cepheid single result test* Anterior Nasal Swab     Status: None   Collection Time: 01/08/23 12:18 PM   Specimen: Anterior Nasal Swab  Result Value Ref Range Status   SARS Coronavirus 2 by RT PCR NEGATIVE NEGATIVE Final    Comment: (NOTE) SARS-CoV-2 target nucleic acids are NOT DETECTED.  The SARS-CoV-2 RNA is generally detectable in upper and lower respiratory specimens during the acute phase of infection. The lowest concentration of SARS-CoV-2 viral copies this assay can detect is 250 copies / mL. A negative result does not preclude SARS-CoV-2 infection and should not be used as the sole basis for treatment or other patient management decisions.  A negative result may occur with improper specimen collection / handling, submission of specimen  other than nasopharyngeal swab, presence of viral mutation(s) within the areas targeted by this assay, and inadequate number of viral copies (<250 copies / mL). A negative result must be combined with clinical observations, patient history, and epidemiological information.  Fact Sheet for Patients:   RoadLapTop.co.za  Fact Sheet for Healthcare Providers: http://kim-miller.com/  This test is not yet approved or  cleared by the [place] FDA and has been authorized for detection and/or diagnosis of SARS-CoV-2 by FDA under an Emergency Use Authorization (EUA).  This EUA will remain in effect (meaning this test can be used) for the duration of the COVID-19 declaration under Section 564(b)(1) of the Act, 21 U.S.C. section 360bbb-3(b)(1), unless the authorization is terminated or revoked sooner.  Performed at [facility], [address] [place], [place] [address]   MRSA Next Gen by PCR, Nasal     Status: None   Collection Time: 01/08/23  5:50 PM   Specimen: Nasal Mucosa; Nasal Swab  Result Value Ref Range Status   MRSA by PCR Next Gen NOT DETECTED NOT DETECTED Final    Comment: (NOTE) The GeneXpert MRSA Assay (FDA approved for NASAL specimens only), is one component of a comprehensive MRSA colonization surveillance program. It is not intended to diagnose MRSA infection nor to guide or monitor treatment for MRSA infections. Test performance is not FDA approved in patients less than 24 years old. Performed at [facility], [address], [place], [place] [address]   Gastrointestinal Panel by PCR , Stool     Status: None   Collection Time: 01/08/23  6:11 PM   Specimen: Stool  Result Value Ref Range Status   Campylobacter species NOT DETECTED NOT DETECTED Final   Plesimonas shigelloides NOT DETECTED NOT DETECTED Final   Salmonella species NOT DETECTED NOT DETECTED Final   Yersinia enterocolitica NOT DETECTED NOT DETECTED Final   Vibrio  species NOT DETECTED NOT DETECTED Final   Vibrio cholerae NOT DETECTED NOT DETECTED Final   Enteroaggregative E coli (EAEC) NOT DETECTED NOT DETECTED Final   Enteropathogenic E coli (EPEC) NOT DETECTED NOT DETECTED Final   Enterotoxigenic E coli (ETEC) NOT DETECTED NOT DETECTED Final   Shiga like toxin producing E coli (STEC) NOT DETECTED NOT DETECTED Final   Shigella/Enteroinvasive E coli (EIEC) NOT DETECTED NOT DETECTED Final   Cryptosporidium NOT DETECTED NOT DETECTED Final   Cyclospora cayetanensis NOT DETECTED NOT DETECTED  Final   Entamoeba histolytica NOT DETECTED NOT DETECTED Final   Giardia lamblia NOT DETECTED NOT DETECTED Final   Adenovirus F40/41 NOT DETECTED NOT DETECTED Final   Astrovirus NOT DETECTED NOT DETECTED Final   Norovirus GI/GII NOT DETECTED NOT DETECTED Final   Rotavirus A NOT DETECTED NOT DETECTED Final   Sapovirus (I, II, IV, and V) NOT DETECTED NOT DETECTED Final    Comment: Performed at [facility], [address] [place], [place] [address]  C Difficile Quick Screen w PCR reflex     Status: None   Collection Time: 01/08/23  6:15 PM   Specimen: Stool  Result Value Ref Range Status   C Diff antigen NEGATIVE NEGATIVE Final   C Diff toxin NEGATIVE NEGATIVE Final   C Diff interpretation No C. difficile detected.  Final    Comment: Performed at [facility], [address] [place], [place] [address]  Urine Culture     Status: Abnormal   Collection Time: 01/14/23  6:30 AM   Specimen: Urine, Random  Result Value Ref Range Status   Specimen Description   Final    URINE, RANDOM Performed at [facility], [address] [place], [place] [address]    Special Requests   Final    NONE Reflexed from [id number] Performed at [facility], [address], [place], [place] [address]    Culture (A)  Final    >=100,000 COLONIES/mL ESCHERICHIA COLI Confirmed Extended Spectrum Beta-Lactamase Producer (ESBL).  In bloodstream infections from ESBL organisms,  carbapenems are preferred over piperacillin/tazobactam. They are shown to have a lower risk of mortality.    Report Status 01/16/2023 FINAL  Final   Organism ID, Bacteria ESCHERICHIA COLI (A)  Final      Susceptibility   Escherichia coli - MIC*    AMPICILLIN >=32 RESISTANT Resistant     CEFAZOLIN >=64 RESISTANT Resistant     CEFEPIME >=32 RESISTANT Resistant     CEFTRIAXONE >=64 RESISTANT Resistant     CIPROFLOXACIN >=4 RESISTANT Resistant     GENTAMICIN >=16 RESISTANT Resistant     IMIPENEM <=0.25 SENSITIVE Sensitive     NITROFURANTOIN <=16 SENSITIVE Sensitive     TRIMETH/SULFA >=320 RESISTANT Resistant     AMPICILLIN/SULBACTAM 16 INTERMEDIATE Intermediate     PIP/TAZO <=4 SENSITIVE Sensitive     * >=100,000 COLONIES/mL ESCHERICHIA COLI  Culture, blood (Routine X 2) w Reflex to ID Panel     Status: None (Preliminary result)   Collection Time: 01/16/23  8:03 AM   Specimen: Right Antecubital; Blood  Result Value Ref Range Status   Specimen Description   Final    RIGHT ANTECUBITAL BOTTLES DRAWN AEROBIC AND ANAEROBIC   Special Requests   Final    Blood Culture results may not be optimal due to an excessive volume of blood received in culture bottles   Culture   Final    NO GROWTH 2 DAYS Performed at [facility], [address], [place], [place] [address]    Report Status PENDING  Incomplete  Culture, blood (Routine X 2) w Reflex to ID Panel     Status: None (Preliminary result)   Collection Time: 01/16/23  8:03 AM   Specimen: BLOOD RIGHT HAND  Result Value Ref Range Status   Specimen Description   Final    BLOOD RIGHT HAND BOTTLES DRAWN AEROBIC AND ANAEROBIC   Special Requests   Final    Blood Culture results may not be optimal due to an excessive volume of  blood received in culture bottles   Culture   Final    NO GROWTH 2 DAYS Performed at [facility], [address], [place], [place] [address]    Report Status PENDING  Incomplete     Scheduled Meds:  (feeding supplement)  PROSource Plus  30 mL Oral BID BM   antiseptic oral rinse  15 mL Mouth Rinse BID   Chlorhexidine Gluconate Cloth  6 each Topical Daily   colchicine  0.6 mg Oral Daily   DULoxetine  60 mg Oral Daily   enoxaparin (LOVENOX) injection  40 mg Subcutaneous Q24H   feeding supplement  237 mL Oral BID BM   feeding supplement (GLUCERNA SHAKE)  237 mL Oral TID BM   ferrous sulfate  325 mg Oral Q breakfast   folic acid  1 mg Intravenous Daily   multivitamin with minerals  1 tablet Oral Daily   oxybutynin  15 mg Oral QHS   pantoprazole (PROTONIX) IV  40 mg Intravenous Q12H   sodium chloride flush  10-40 mL Intracatheter Q12H   Continuous Infusions:  meropenem (MERREM) IV 1 g (01/18/23 0533)   potassium PHOSPHATE IVPB (in mmol)     sodium bicarbonate 150 mEq in dextrose 5 % 1,150 mL infusion 75 mL/hr at 01/17/23 2202   thiamine (VITAMIN B1) injection 500 mg (01/18/23 0621)    Procedures/Studies: NM Hepato W/EF  Result Date: 01/17/2023 CLINICAL DATA:  Right upper quadrant abdominal pain. EXAM: NUCLEAR MEDICINE HEPATOBILIARY IMAGING WITH GALLBLADDER EF TECHNIQUE: Sequential images of the abdomen were obtained out to 60 minutes following intravenous administration of radiopharmaceutical. After slow intravenous infusion of 2.1 micrograms Cholecystokinin, gallbladder ejection fraction was determined. RADIOPHARMACEUTICALS:  5.5 mCi Tc-25m Choletec IV COMPARISON:  Ultrasound January 09, 2023. FINDINGS: Prompt uptake and biliary excretion of activity by the liver is seen. Gallbladder activity is visualized, consistent with patency of cystic duct. Biliary activity passes into small bowel, consistent with patent common bile duct. Calculated gallbladder ejection fraction is 98%. (At 60 min, normal ejection fraction is greater than 40% and less than 80%.) IMPRESSION: 1.  Patent cystic and common bile ducts. 2. Elevated gallbladder ejection fraction as can be seen with gallbladder hyperkinesis. Electronically Signed    By: Maudry Mayhew M.D.   On: 01/17/2023 16:37   [place] EKG SITE RITE  Result Date: 01/16/2023 If [facility] image not attached, placement could not be confirmed due to current cardiac rhythm.  CT CHEST W CONTRAST  Result Date: 01/15/2023 CLINICAL DATA:  Questioning esophageal mass. EXAM: CT CHEST WITH CONTRAST TECHNIQUE: Multidetector CT imaging of the chest was performed during intravenous contrast administration. RADIATION DOSE REDUCTION: This exam was performed according to the departmental dose-optimization program which includes automated exposure control, adjustment of the mA and/or kV according to patient size and/or use of iterative reconstruction technique. CONTRAST:  75mL OMNIPAQUE IOHEXOL 300 MG/ML  SOLN COMPARISON:  Esophagram 01/14/2023 FINDINGS: Cardiovascular: No significant vascular findings. Normal heart size. No pericardial effusion. Mediastinum/Nodes: No enlarged mediastinal, hilar, or axillary lymph nodes. Thyroid gland, trachea, and esophagus demonstrate no significant findings. Lungs/Pleura: There is minimal atelectasis in the left lower lobe. The lungs are otherwise clear. There is no pleural effusion or pneumothorax. Upper Abdomen: No acute findings. Rounded 2 point 0 cm left renal hypodensity likely represents a cyst or complex cyst. There are postsurgical changes in the stomach. Musculoskeletal: No chest wall abnormality. No acute or significant osseous findings. IMPRESSION: 1. No acute cardiopulmonary process. 2. Minimal atelectasis in the left lower  lobe. Electronically Signed   By: Darliss Cheney M.D.   On: 01/15/2023 22:29   DG ESOPHAGUS W SINGLE CM (SOL OR THIN BA)  Result Date: 01/14/2023 CLINICAL DATA:  Patient with a history of dysphagia. EXAM: ESOPHAGUS/BARIUM SWALLOW/TABLET STUDY TECHNIQUE: Single contrast examination was performed using thin liquid barium. This exam was performed by Alwyn Ren, NP, and was supervised and interpreted by Rad . FLUOROSCOPY: Fluoro  time: 1.12 sec COMPARISON:  None Available. FINDINGS: Swallowing: Significant delay in swallow initiation/mechanism. No vestibular penetration or aspiration seen. Pharynx: Unremarkable. Esophagus: Prominent cricopharyngeal bar. Small diverticulum in the lower esophagus. Questionable extrinsic compression along the anterior aspect of the distal esophagus, best seen on image 45 of series 4. Esophageal motility: Advanced dysmotility with spasms. Hiatal Hernia: None. Gastroesophageal reflux: None visualized. Ingested 13 mm barium tablet: Not given. Patient needs pills crushed. Other: None. IMPRESSION: 1. 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 Further evaluation with endoscopy is recommended. 2. Significant delay in swallow initiation/mechanism. Prominent cricopharyngeal bar. 3. Advanced esophageal dysmotility with spasms. 4. Limited study due to patient's body habitus and mobility limitations. Electronically Signed   By: Acquanetta Belling M.D.   On: 01/14/2023 13:39   US Abdomen Complete  Result Date: 01/09/2023 CLINICAL DATA:  [id number] Gallbladder dilatation [id number] Generalized abdominal pain [id number] EXAM: ABDOMEN ULTRASOUND COMPLETE COMPARISON:  None Available. FINDINGS: Gallbladder: Gallbladder appears distended. Echogenic structure in the gallbladder is consistent with sludge. No shadowing gallstones or wall thickening visualized. No sonographic Murphy sign noted by sonographer. Common bile duct: Diameter: 0.4cm. Liver: No focal lesion identified. Hyperechoic consistent with fatty infiltration. IVC: No abnormality visualized. Pancreas: Obscured by bowel gas. Spleen: Size and appearance within normal limits. Right Kidney: Length: 10.6 cm. Echogenicity increased consistent with chronic medical renal disease. No mass or hydronephrosis visualized. No shadowing stones. Left Kidney: Length: 9.6cm. Echogenicity increased  consistent with chronic medical renal disease. Cyst measures 5.5 cm. No follow up recommended based on the ultrasound appearance of the cyst. No solid mass or hydronephrosis visualized. No shadowing stones. Abdominal aorta: Obscured by bowel gas. IMPRESSION: 1. Hepatic fatty infiltration. 2. Gallbladder distended. 3. Minimal gallbladder sludge. 4. Echogenic kidneys consistent with chronic medical renal disease. 5. Left kidney cyst. 6. There was limited visualization, as described. Electronically Signed   By: Layla Maw M.D.   On: 01/09/2023 11:17   CT ABDOMEN PELVIS WO CONTRAST  Result Date: 01/08/2023 CLINICAL DATA:  Abdominal pain. EXAM: CT ABDOMEN AND PELVIS WITHOUT CONTRAST TECHNIQUE: Multidetector CT imaging of the abdomen and pelvis was performed following the standard protocol without IV contrast. RADIATION DOSE REDUCTION: This exam was performed according to the departmental dose-optimization program which includes automated exposure control, adjustment of the mA and/or kV according to patient size and/or use of iterative reconstruction technique. COMPARISON:  CT 10/15/2022 and older FINDINGS: Evaluation significant limited by motion despite multiple attempts. As per the CT technologist, the patient had difficulty with the breathing instructions. Lower chest: Lung bases are limited by motion. There is some basilar atelectasis. Coronary artery calcifications are seen. Hepatobiliary: Grossly preserved parenchyma. Gallbladder is dilated. Pancreas: No obvious pancreatic mass. Spleen: Spleen is nonenlarged. Adrenals/Urinary Tract: Nodularity of the adrenal glands, poorly defined with the motion. No renal collecting system dilatation. No obvious obstructing stones. There is some cystic lesions in the left kidney including a larger lower pole focus with some septations and calcifications. Again evaluation is limited but appears similar to previous. Mildly  distended urinary bladder. Stomach/Bowel: Surgical  changes gastric bypass. The stomach, small and large bowel are nondilated. There are some scattered stool. Details of the bowel are limited. Vascular/Lymphatic: Scattered vascular calcifications. No obvious dilatation of the abdominal aorta. IVC is nondilated. No obvious lymph node enlargement. Reproductive: Status post hysterectomy. No adnexal masses. Other: Markedly limited by motion. No obvious fluid collections. No large amounts of free air. Musculoskeletal: Scattered degenerative changes of the spine and pelvis. Overall moderate to severe. IMPRESSION: Distended gallbladder. If there is concern of gallbladder pathology ultrasound may be useful as the next step in the workup. Grossly stable left-sided renal cystic foci. Surgical changes of previous gastric bypass Markedly limited examination due to the level of motion. No obvious obstruction, free air or free fluid. In addition, a repeat study could be considered when the patient is more clinically able. Electronically Signed   By: Karen Kays M.D.   On: 01/08/2023 13:41   CT Head Wo Contrast  Result Date: 01/08/2023 CLINICAL DATA:  Altered mental status, weakness for 3 days, confusion EXAM: CT HEAD WITHOUT CONTRAST TECHNIQUE: Contiguous axial images were obtained from the base of the skull through the vertex without intravenous contrast. RADIATION DOSE REDUCTION: This exam was performed according to the departmental dose-optimization program which includes automated exposure control, adjustment of the mA and/or kV according to patient size and/or use of iterative reconstruction technique. COMPARISON:  01/03/2023 FINDINGS: Brain: No evidence of acute infarction, hemorrhage, hydrocephalus, extra-axial collection or mass lesion/mass effect. Mild periventricular white matter hypodensity. Vascular: No hyperdense vessel or unexpected calcification. Skull: Normal. Negative for fracture or focal lesion. Sinuses/Orbits: No acute finding. Other: None. IMPRESSION: No  acute intracranial pathology.  Small-vessel white matter disease. Electronically Signed   By: Jearld Lesch M.D.   On: 01/08/2023 13:38   DG Chest Port 1 View  Result Date: 01/08/2023 CLINICAL DATA:  Altered mental status. EXAM: PORTABLE CHEST 1 VIEW COMPARISON:  01/03/2023 FINDINGS: Low lung volumes. Similar streaky density in the retrocardiac medial left base, likely vascular anatomy. No focal consolidation, pulmonary edema, or pleural effusion. Cardiopericardial silhouette is at upper limits of normal for size. No acute bony abnormality. Telemetry leads overlie the chest. IMPRESSION: No active disease. Electronically Signed   By: Kennith Center M.D.   On: 01/08/2023 12:23    Catarina Hartshorn, DO  [facility]  If 7PM-7AM, please contact night-coverage [email or web address] Password [facility] 01/18/2023, 7:46 AM   LOS: 10 days

## 2023-01-18 NOTE — TOC Progression Note (Signed)
Transition of Care [facility]) - Progression Note    Patient Details  Name: Christine [name] MRN: [id number] Date of Birth: Nov 14, 1950  [facility] CM/SW Contact  Carley Hammed, LCSW Phone Number: 01/18/2023, 2:06 PM  Clinical Narrative:    CSW acknowledges pt's arrival from [facility]. Chart reviewed, pt initially from [facility] rehab for short term care. Family worked with previous CSW to transition to [facility] at discharge. Pt to get Peg tube and then authorization will need to be started for SNF. TOC will continue to follow for DC needs.   Expected Discharge Plan: Skilled Nursing Facility Barriers to Discharge: Continued Medical Work up  Expected Discharge Plan and Services In-house Referral: Clinical Social Work Discharge Planning Services: CM Consult Post Acute Care Choice: Skilled Nursing Facility Living arrangements for the past 2 months: Skilled Nursing Facility                                       Social Determinants of Health (SDOH) Interventions SDOH Screenings   Food Insecurity: Patient Unable To Answer (01/08/2023)  Housing: Patient Declined (01/08/2023)  Transportation Needs: Patient Unable To Answer (01/08/2023)  Utilities: Patient Unable To Answer (01/08/2023)  Alcohol Screen: Low Risk  (07/13/2022)  Depression (PHQ2-9): Low Risk  (01/21/2022)  Financial Resource Strain: Low Risk  (12/01/2022)   Received from [facility]  Physical Activity: Insufficiently Active (07/13/2022)  Social Connections: Moderately Integrated (12/01/2022)   Received from [facility]  Stress: No Stress Concern Present (07/13/2022)  Tobacco Use: Medium Risk (01/17/2023)  Health Literacy: Medium Risk (12/01/2022)   Received from [facility]    Readmission Risk Interventions    01/09/2023   11:11 AM  Readmission Risk Prevention Plan  Transportation Screening Complete  HRI or Home Care Consult Complete  Social Work Consult for Recovery Care Planning/Counseling  Complete  Palliative Care Screening Not Applicable  Medication Review Oceanographer) Complete

## 2023-01-18 NOTE — Progress Notes (Signed)
   01/18/23 1942  BiPAP/CPAP/SIPAP  $ Non-Invasive Home Ventilator  Subsequent  BiPAP/CPAP/SIPAP Pt Type Adult  BiPAP/CPAP/SIPAP Resmed  Mask Type Nasal pillows  Mask Size Small  IPAP 15 cmH20  EPAP 5 cmH2O  FiO2 (%) 21 %  Patient Home Equipment Yes  Auto Titrate No  CPAP/SIPAP surface wiped down Yes  Safety Check Completed by RT for Home Unit Yes, no issues noted  BiPAP/CPAP /SiPAP Vitals  Bilateral Breath Sounds Clear

## 2023-01-19 DIAGNOSIS — Z7189 Other specified counseling: Secondary | ICD-10-CM | POA: Diagnosis not present

## 2023-01-19 DIAGNOSIS — E870 Hyperosmolality and hypernatremia: Secondary | ICD-10-CM | POA: Diagnosis not present

## 2023-01-19 DIAGNOSIS — Z515 Encounter for palliative care: Secondary | ICD-10-CM | POA: Diagnosis not present

## 2023-01-19 LAB — GLUCOSE, CAPILLARY
Glucose-Capillary: 74 mg/dL (ref 70–99)
Glucose-Capillary: 73 mg/dL (ref 70–99)
Glucose-Capillary: 61 mg/dL — ABNORMAL LOW (ref 70–99)
Glucose-Capillary: 81 mg/dL (ref 70–99)
Glucose-Capillary: 73 mg/dL (ref 70–99)

## 2023-01-19 LAB — VITAMIN B1: Vitamin B1 (Thiamine): 71.2 nmol/L (ref 66.5–200.0)

## 2023-01-19 MED ORDER — MEGESTROL ACETATE 40 MG PO TABS
40.0000 mg | ORAL_TABLET | Freq: Every day | ORAL | Status: DC
  Administered 2023-01-19: 40 mg via ORAL
  Filled 2023-01-19 (×2): qty 1

## 2023-01-19 MED ORDER — CALCIUM CARBONATE ANTACID 500 MG PO CHEW
200.0000 mg | CHEWABLE_TABLET | Freq: Three times a day (TID) | ORAL | Status: DC
  Administered 2023-01-19 – 2023-01-26 (×11): 200 mg via ORAL
  Filled 2023-01-19 (×17): qty 1

## 2023-01-19 MED ORDER — ADULT MULTIVITAMIN W/MINERALS CH
1.0000 | ORAL_TABLET | Freq: Two times a day (BID) | ORAL | Status: DC
  Administered 2023-01-19 – 2023-01-26 (×12): 1 via ORAL
  Filled 2023-01-19 (×12): qty 1

## 2023-01-19 MED ORDER — GLUCERNA SHAKE PO LIQD
237.0000 mL | Freq: Three times a day (TID) | ORAL | Status: DC
  Administered 2023-01-19 – 2023-01-31 (×19): 237 mL via ORAL
  Filled 2023-01-19 (×2): qty 237

## 2023-01-19 MED ORDER — ORAL CARE MOUTH RINSE
15.0000 mL | Freq: Three times a day (TID) | OROMUCOSAL | Status: DC
  Administered 2023-01-19 – 2023-02-04 (×37): 15 mL via OROMUCOSAL

## 2023-01-19 MED ORDER — SODIUM CHLORIDE 0.9 % IV SOLN
1.0000 g | Freq: Two times a day (BID) | INTRAVENOUS | Status: DC
  Administered 2023-01-19 – 2023-01-20 (×2): 1 g via INTRAVENOUS
  Filled 2023-01-19 (×3): qty 20

## 2023-01-19 MED ORDER — PROSOURCE PLUS PO LIQD
30.0000 mL | Freq: Three times a day (TID) | ORAL | Status: DC
  Administered 2023-01-19: 30 mL via ORAL
  Filled 2023-01-19 (×2): qty 30

## 2023-01-19 MED ORDER — GERHARDT'S BUTT CREAM
TOPICAL_CREAM | Freq: Two times a day (BID) | CUTANEOUS | Status: DC
  Filled 2023-01-19 (×3): qty 1

## 2023-01-19 NOTE — Consult Note (Incomplete)
[name] Psychiatry Consult Note   Service Date: January 19, 2023 LOS:  LOS: 11 days  MRN: [id number] Type of consult:  New   Primary Psychiatric Diagnoses  *** *** ***  Assessment  Kindyl Barsch is a 72 y.o. female with a past psychiatric history of unspecified depression and generalized anxiety disorder . 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 Cymbalta absorption or withdrawal issues? Psychiatry evaluation would be greatly appreciated"  by Sander Radon, MD.   Her current presentation of *** is most consistent with ***. She additionally meets criteria for *** based on ***.  Current outpatient psychotropic medications include *** and historically she has had a *** response to these medications. She was *** compliant with medications prior to admission as evidenced by ***. On initial examination, patient ***. Please see plan below for detailed recommendations.  Diagnoses:  Active Hospital problems: Principal Problem:   Hypernatremia Active Problems:   Gastro-esophageal reflux disease with esophagitis   Essential hypertension   Osteoarthrosis   OSA on CPAP   GAD (generalized anxiety disorder)   Mixed incontinence urge and stress   Morbid obesity (HCC)   Abdominal pain   Gout   Gastroesophageal reflux disease   Prediabetes   Memory difficulties   Hyperlipidemia   Acute metabolic encephalopathy   Severe dehydration   Altered mental status   Failure to thrive in adult   AKI (acute kidney injury) (HCC)   Gallbladder dilatation   Esophageal dysphagia   Abnormal esophagram   Hypoglycemia    Plan and Recommendations  ## Interventions (medications, psychoeducation, etc):  -- ***  ## Further Work-up:  -- thiamine (vitamin B1) pending, zinc (?) ***  -- most recent EKG on 01/16/2023 had QtC of 486 with Framingham correction -- Pertinent labwork reviewed earlier this admission includes: CBC, CMP, vitamin B12 (high), vitamin D (low),  TSH, folate, cortisol, urinalysis  ## Medical Decision Making Capacity:  -- {CL capacity:28805}  ## Disposition:  -- {C/L dispo:28710}  ## Behavioral / Environmental:  -- Standard delirium precautions and Fall precautions -- ***  ##Legal Status -- {C/L voluntary:28712}  Thank you for this consult request. Our recommendations are listed above.  We will {C/L follow vs not:28713}  ## Safety and Observation Level:  - Based on my clinical evaluation, I estimate the patient to be at {DESC; LOW/MEDIUM/HIGH:23084} risk of self harm in the current setting - At this time, we recommend a {observation:28589} level of observation. This decision is based on my review of the chart including patient's history and current presentation, interview of the patient, mental status examination, and consideration of suicide risk including evaluating suicidal ideation, plan, intent, suicidal or self-harm behaviors, risk factors, and protective factors. This judgment is based on our ability to directly address suicide risk, implement suicide prevention strategies and develop a safety plan while the patient is in the clinical setting. Please contact our team if there is a concern that risk level has changed.  Augusto Gamble, MD  History Obtained on Initial Interview  Relevant Aspects of Hospital Course:  Admitted on 01/08/2023 for altered mental status and progressive weakness. 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 Currently has feeding tube in place.  Patient Report:  ***  Psychiatric ROS:  ***  Collateral information obtained (***, patient's ***) Patient {collateral permission:29820}  ***  During this conversation, I {collateral details:29027}.  Psychiatric and Social History   Psychiatric History  Information collected from {C/L info collect:30575::"available chart review"}  Prev dx/sx: *** Current Psych Provider: *** Current Psych Meds: duloxetine 60  mg daily, gabapentin 100 mg TID Past Psych Meds: *** Therapy: *** Prior self-harm: *** Prior violence: ***  Family psych history: ***  Social History:  Developmental Hx: *** Legal Hx: *** Living situation: *** Occupational Hx: *** Spiritual Hx:*** Access to weapons: *** Sexual Hx:*** Educational Hx: ***  Substance Use History: Alcohol: {alcohol use:28753} Hx withdrawal tremors/shakes: {denies/endorses/idk:28317} Hx alcohol related blackouts: {denies/endorses/idk:28317} Hx alcohol induced hallucinations: {denies/endorses/idk:28317} Hx alcoholic seizures: {denies/endorses/idk:28317} Hx delirium tremens (DTs): {denies/endorses/idk:28317} DUI: {denies/endorses/idk:28317}  --------  Tobacco: {yes/no tobacco:28722} Cannabis (marijuana): {marijuana:28723} Cocaine: {drug abuse:28724} Methamphetamines: {drug abuse:28724} Psilocybin (mushrooms): {drug abuse:28724} Ecstasy (MDMA / molly): {drug abuse:28724} Opiates (fentanyl / heroin): {drug abuse:28724} Benzos (Xanax, Klonopin): {drug abuse:28724} IV drug use: {endorses/denies:28725} Prescribed meds abuse: {prescribed med abuse:28726}  History of detox: {detox hx:28754} History of rehab: {rehab hx:28755}  Other Histories  These are pulled from EMR and updated if appropriate.   Family History:  {PSYHX:61224} The patient's family history includes Alcohol abuse in her brother; Arthritis in her mother and sister; Asthma in her father and mother; Diabetes in her father, mother, sister, and sister; Drug abuse in her brother; Heart disease in her father and mother; High blood pressure in her father and mother; Varicose Veins in her sister.  Medical History: Past Medical History:  Diagnosis Date   Allergy    dust, pollen, sulfa, prednisone   Anemia    Anxiety    Arthritis    "knees" (04/26/2016)   Colon polyps    benign per pt   Coronary artery disease    mild per 2015 cath in [place] (OM1 30%, RCA 30%)   GERD  (gastroesophageal reflux disease)    Gout    History of hiatal hernia    Hyperlipidemia 05/20/2021   Hypertension    Migraine    "none since early /2017" (05/06/2016)   Obesity    OSA on CPAP    uses CPAP   Pre-diabetes    Pre-operative cardiovascular examination 08/22/2008   Renal insufficiency 11/09/2022   Vasculitis (HCC)    Bilateral    Surgical History: Past Surgical History:  Procedure Laterality Date   ABDOMINAL HYSTERECTOMY     "partial"; both ovaries present   APPENDECTOMY  05/06/2016   BIOPSY  04/08/2022   Procedure: BIOPSY;  Surgeon: Sherrilyn Rist, MD;  Location: [facility];  Service: Gastroenterology;;   CARDIAC CATHETERIZATION  ~ 2015   CARDIAC CATHETERIZATION  2015   In [place]   CHILECTOMY Right 06/01/2017   Procedure: CHILECTOMY RIGHT FOOT;  Surgeon: Felecia Shelling, DPM;  Location: [facility] OR;  Service: Podiatry;  Laterality: Right;   ESOPHAGOGASTRODUODENOSCOPY (EGD) WITH PROPOFOL N/A 04/08/2022   Procedure: ESOPHAGOGASTRODUODENOSCOPY (EGD) WITH PROPOFOL;  Surgeon: Sherrilyn Rist, MD;  Location: [facility];  Service: Gastroenterology;  Laterality: N/A;   LAPAROSCOPIC APPENDECTOMY N/A 05/06/2016   Procedure: LAPAROSCOPIC APPENDECTOMY;  Surgeon: Manus Rudd, MD;  Location: [facility] OR;  Service: General;  Laterality: N/A;   POLYPECTOMY  04/08/2022   Procedure: POLYPECTOMY;  Surgeon: Sherrilyn Rist, MD;  Location: [facility];  Service: Gastroenterology;;   TONSILLECTOMY      Medications:   Current Facility-Administered Medications:    acetaminophen (TYLENOL) tablet 650 mg, 650 mg, Oral, Q6H PRN, 650 mg at 01/10/23 2236 **OR** acetaminophen (TYLENOL) suppository 650 mg, 650 mg, Rectal, Q6H PRN, Tat, Onalee Hua, MD, 650 mg at 01/15/23 2215   antiseptic oral rinse (BIOTENE) solution 15  mL, 15 mL, Mouth Rinse, BID, Tat, David, MD, 15 mL at 01/18/23 2229   Chlorhexidine Gluconate Cloth 2 % PADS 6 each, 6 each, Topical, Daily, Tat, David, MD, 6 each at 01/18/23  1103   colchicine tablet 0.6 mg, 0.6 mg, Oral, Daily, Tat, David, MD, 0.6 mg at 01/19/23 1111   diazepam (VALIUM) injection 2.5 mg, 2.5 mg, Intravenous, BID PRN, Tat, David, MD, 2.5 mg at 01/17/23 2216   diclofenac Sodium (VOLTAREN) 1 % topical gel 2 g, 2 g, Topical, QID PRN, Tat, David, MD   DULoxetine (CYMBALTA) DR capsule 60 mg, 60 mg, Oral, Daily, Tat, David, MD, 60 mg at 01/19/23 1111   enoxaparin (LOVENOX) injection 40 mg, 40 mg, Subcutaneous, Q24H, Tat, David, MD, 40 mg at 01/18/23 2228   fentaNYL (SUBLIMAZE) injection 25 mcg, 25 mcg, Intravenous, Q4H PRN, Tat, David, MD   ferrous sulfate tablet 325 mg, 325 mg, Oral, Q breakfast, Tat, David, MD, 325 mg at 01/19/23 1111   folic acid injection 1 mg, 1 mg, Intravenous, Daily, Tat, David, MD, 1 mg at 01/17/23 1247   Gerhardt's butt cream, , Topical, BID, Gherghe, Costin M, MD   meropenem (MERREM) 1 g in sodium chloride 0.9 % 100 mL IVPB, 1 g, Intravenous, Q8H, Tat, David, MD, Last Rate: 200 mL/hr at 01/19/23 0540, 1 g at 01/19/23 0540   multivitamin with minerals tablet 1 tablet, 1 tablet, Oral, Daily, Tat, David, MD, 1 tablet at 01/19/23 1111   ondansetron (ZOFRAN) tablet 4 mg, 4 mg, Oral, Q6H PRN **OR** ondansetron (ZOFRAN) injection 4 mg, 4 mg, Intravenous, Q6H PRN, Tat, David, MD   Oral care mouth rinse, 15 mL, Mouth Rinse, PRN, Tat, David, MD   Oral care mouth rinse, 15 mL, Mouth Rinse, PRN, Tat, David, MD   oxybutynin (DITROPAN-XL) 24 hr tablet 15 mg, 15 mg, Oral, QHS, Tat, David, MD, 15 mg at 01/18/23 2228   oxyCODONE (Oxy IR/ROXICODONE) immediate release tablet 5 mg, 5 mg, Oral, Q4H PRN, Tat, David, MD, 5 mg at 01/18/23 0856   pantoprazole (PROTONIX) injection 40 mg, 40 mg, Intravenous, Q12H, Tat, David, MD, 40 mg at 01/19/23 1111   sodium chloride flush (NS) 0.9 % injection 10-40 mL, 10-40 mL, Intracatheter, Q12H, Tat, David, MD, 10 mL at 01/19/23 1112   sodium chloride flush (NS) 0.9 % injection 10-40 mL, 10-40 mL, Intracatheter, PRN,  Tat, David, MD   thiamine (VITAMIN B1) injection 100 mg, 100 mg, Intravenous, Daily, Calton Dach I, RPH, 100 mg at 01/19/23 1112   traZODone (DESYREL) tablet 50 mg, 50 mg, Oral, QHS PRN, Catarina Hartshorn, MD, 50 mg at 01/14/23 2218  Allergies: Allergies  Allergen Reactions   Prednisone Swelling    Legs swell    Tape Hives   Sulfa Antibiotics Rash    Exam Findings  Vital signs:  Temp:  [98.1 F (36.7 C)-98.5 F (36.9 C)] 98.5 F (36.9 C) (09/25 0819) Pulse Rate:  [88-93] 91 (09/25 0819) Resp:  [17] 17 (09/25 0819) BP: (108-135)/(60-76) 108/60 (09/25 0819) SpO2:  [100 %] 100 % (09/25 0819) FiO2 (%):  [21 %] 21 % (09/24 1942)  Psychiatric Specialty Exam:  Presentation  General Appearance: No data recorded Eye Contact:No data recorded Speech:No data recorded Speech Volume:No data recorded Handedness:No data recorded  Mood and Affect  Mood:No data recorded Affect:No data recorded  Thought Process  Thought Processes:No data recorded Descriptions of Associations:No data recorded Orientation:No data recorded Thought Content:No data recorded History of Schizophrenia/Schizoaffective disorder:No data recorded Duration  of Psychotic Symptoms:No data recorded Hallucinations:No data recorded Ideas of Reference:No data recorded Suicidal Thoughts:No data recorded Homicidal Thoughts:No data recorded  Sensorium  Memory:No data recorded Judgment:No data recorded Insight:No data recorded  Executive Functions  Concentration:No data recorded Attention Span:No data recorded Recall:No data recorded Fund of Knowledge:No data recorded Language:No data recorded  Psychomotor Activity  Psychomotor Activity:No data recorded  Assets  Assets:No data recorded  Sleep  Sleep:No data recorded  Physical Exam: Physical Exam  Blood pressure 108/60, pulse 91, temperature 98.5 F (36.9 C), resp. rate 17, height 5\' 1"  (1.549 m), weight 104.5 kg, SpO2 100%. Body mass index is 43.53  kg/m.

## 2023-01-19 NOTE — Progress Notes (Signed)
Nutrition Follow-up  DOCUMENTATION CODES:   Not applicable  INTERVENTION:  Recommend regular diet with emphasis on protein Glucerna Shake po TID, each supplement provides 220 kcal and 10 grams of protein 30 ml ProSource Plus BID, each supplement provides 100 kcals and 15 grams protein.  Carnation Breakfast Essentials BID, each packet mixed with 8 ounces of 2% milk provides 13 grams of protein and 260 calories. Bariatric vitamin regimen: MVI BID and TUMS TID  NUTRITION DIAGNOSIS:   Increased nutrient needs related to wound healing as evidenced by estimated needs. - remains applicable  GOAL:   Patient will meet greater than or equal to 90% of their needs - goal unmet  MONITOR:   PO intake, Supplement acceptance  REASON FOR ASSESSMENT:   Consult Assessment of nutrition requirement/status  ASSESSMENT:   72 yo female admitted with severe dehydration, AKI. 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  4 months declining.   Pt reports having psych eval  4 months since bariatric surgery.Marland Kitchenappetite doing fine following bariatric surgery Having some problems and checked into hospital--wouldn't eat bc didn't like food Ordering door dash--italian, [employer]aco [employer], order fruit Eating patterns changed since surgery?   Pt reports feeling overwhelmed by being forced to eat.   Likes glucerna the most 30 ml ProSource Plus TID, each supplement provides 100 kcals and 15 grams protein.  Doesn't like ensure [employer] Essentials once, each packet mixed with 8 ounces of 2% milk provides 13 grams of protein and 260 calories.  Medications:   Labs:   Diet Order:   Diet Order             Diet bariatric advanced Room service appropriate? Yes; Fluid consistency: Thin  Diet effective now                   EDUCATION NEEDS:   Education needs have been addressed  Skin:  Skin Assessment: Skin Integrity Issues: Skin Integrity Issues::  Stage II, Stage III Stage II: coccyx, 8 areas to L & R buttocks, L thigh Stage III: 2 areas to R buttocks  Last BM:  9/24  Height:   Ht Readings from Last 1 Encounters:  01/08/23 5\' 1"  (1.549 m)    Weight:   Wt Readings from Last 1 Encounters:  01/18/23 104.5 kg    Ideal Body Weight:     BMI:  Body mass index is 43.53 kg/m.  Estimated Nutritional Needs:   Kcal:  1700-2000  Protein:  100-120 gm  Fluid:  1.5-2 L  Drusilla Kanner, RDN, LDN Clinical Nutrition

## 2023-01-19 NOTE — Consult Note (Signed)
WOC Nurse Consult Note: Reconsulted to assess buttocks and sacral wound. Ongoing moisture associated skin damage.  Purewick is in place but patient is saturated in urine.  Tech and nurse in the room assisting with clean up and replacing a new purewick.  Body habitus contributes to slippage of Purewick.  Reason for Consult:ongoing wounds to buttocks and inner thighs from exposure to incontinence.  Female external catheter in place.  Large quantity of urine and body habitus contribute to frequent exposure to moisture.  Patient can roll over with 2person assist but cannot hold body on her side.  Poor bed mobility. Heels with nonblanchable erythema bilaterally Wound type:moisture and pressure (heels)  Stage 1 to heels Pressure Injury POA: NO  (MASD was present on admission)  Measurement: Bilateral heels with 4 cm x 3 cm nonblanchable erythema Scattered nonintact wounds to left and right buttocks 1 cm round on each upper buttock and sacrum with 1.5 cm x 1 cm x 0.1 cm open area Wound bed: red and moist Drainage (amount, consistency, odor) minimal serosanguinous  no odor Periwound:frequently moist Dressing procedure/placement/frequency:cleanse buttocks and perineal skin with soap and water and moisturize with Gerhardts barrier cream.  Apply each shift and PRN soilage.   Heel foam to bilateral heels.  Elevate heels off bed with pillows under calf (does not want prevalon)  Will not follow at this time.  Please re-consult if needed.  Mike Gip MSN, RN, FNP-BC CWON Wound, Ostomy, Continence Nurse [facility] [phone] Pager [phone]

## 2023-01-19 NOTE — Progress Notes (Signed)
Patient interest in food slightly improved. Patient states, "I want to eat but I just can't." Patient prefers liquids such as chocolate milk and orange juice. Calorie count initiated. Patient is being monitored for pressure injuries to her sacrum and bilat thigh. Site cleansed, and cream applied with foams.

## 2023-01-19 NOTE — Progress Notes (Signed)
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  Has had multiple hospitalizations and ED visits in the past several months.  Postoperative course complicated by very poor oral intake, weakness, currently residing in an SNF.  She has refused to eat, drink, and take her medications.  She was admitted to [facility] with confusion.  GI was consulted and did not find an organic cause for her not eating.  After discussing with the family, a feeding tube was recommended and she was transferred to [facility] for surgery consultation.  Subjective / 24h Interval events: No complaints for me this morning.  No chest pain, no shortness of breath, no abdominal pain, no nausea or vomiting.  She tells me she just does not want to eat because food does not taste well anymore  Assesement and Plan: Principal Problem:   Hypernatremia Active Problems:   Acute metabolic encephalopathy   Essential hypertension   Gastro-esophageal reflux disease with esophagitis   Osteoarthrosis   OSA on CPAP   GAD (generalized anxiety disorder)   Mixed incontinence urge and stress   Morbid obesity (HCC)   Abdominal pain   Gout   Gastroesophageal reflux disease   Prediabetes   Memory difficulties   Hyperlipidemia   Severe dehydration   Altered mental status   Failure to thrive in adult   AKI (acute kidney injury) (HCC)   Gallbladder dilatation   Esophageal dysphagia   Abnormal esophagram   Hypoglycemia   Principal problem Hypernatremia / Severe Dehydration - pt presented with severe clinical findings of free water deficit.  Has received IV fluids and sodium is now normalized.  Monitor off fluids   Active  problems Hypoglycemia/Failure to Thrive - due poor oral intake and recent gastric bypass.  Continue to monitor CBGs. Am cortisol 25.7. TSH 1.715. Y86--5784. Folate 8.6   Adult Failure to Thrive/Esophageal dysmotility -Patient continues to have poor oral intake and no desire to eat. She continues to " pocket" her pills and food. Pt's sister traveled by train from [place] to come here to visit and help with arrangements for post hospital care -9/20 Esophagram--advanced esophageal dysmotility but no obstruction.  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  GI consulted, they do not appreciate this being an issue for which she could not eat.  After discussions with family, she was transferred to [facility] for surgical evaluation for gastrostomy tube.  Surgery input pending  UTI--ESBL E coli -9/20 UA>50 WBC. 9/20 urine culture = MDR Ecoli.  Has been placed on meropenem, today's day #3  Abdominal pain -9/14 CT abd--limited by motion, but  No obvious obstruction, free air or free fluid; s/p gastric bypass. 9/15 Abd US--GB distended, minimal GB sludge.  LFTs were normal.  She also underwent a HIDA scan which showed patent cystic and common bile ducts   NAGMA -improved after bicarb drip   Hypokalemia/Hypomagnesemia/Hypophosphatemia -replace and continue to monitor   AKI -baseline creatinine 0.6-1.0, -serum creatinine peaked 1.66, currently 1.2.  Continue to monitor   Acute metabolic encephalopathy/Failure to Thrive - secondary to severe hypernatremia, AKI, mental status now back to baseline  OSA  - nightly CPAP ordered  Essential hypertension  -continue to monitor blood pressure   Morbid Obesity - pt is postop s/p 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 Dossie Der.  BMI 43  GERD - continue PPI   Sacral pressure wounds - noted below, appreciate wound care nursing team  consultation   Goals of care -discussed with sister, brother, patient>>pt does not want gastrostomy tube -pt understands that there is nothing medically prohibitive affecting her swallowing and that she is able to eat. she acknowledges that she chooses not to chew and swallow -consult palliative medicine -numerous GOC discussions with pt and brother and sister who are patient's main advocate--not ready for full comfort -They are agreeable for gastrostomy tube placement for enteral feeding and plan DC to SNF for STR  Scheduled Meds:  antiseptic oral rinse  15 mL Mouth Rinse BID   Chlorhexidine Gluconate Cloth  6 each Topical Daily   colchicine  0.6 mg Oral Daily   DULoxetine  60 mg Oral Daily   enoxaparin (LOVENOX) injection  40 mg Subcutaneous Q24H   ferrous sulfate  325 mg Oral Q breakfast   folic acid  1 mg Intravenous Daily   Gerhardt's butt cream   Topical BID   multivitamin with minerals  1 tablet Oral Daily   oxybutynin  15 mg Oral QHS   pantoprazole (PROTONIX) IV  40 mg Intravenous Q12H   sodium chloride flush  10-40 mL Intracatheter Q12H   thiamine (VITAMIN B1) injection  100 mg Intravenous Daily   Continuous Infusions:  meropenem (MERREM) IV 1 g (01/19/23 0540)   PRN Meds:.acetaminophen **OR** acetaminophen, diazepam, diclofenac Sodium, fentaNYL (SUBLIMAZE) injection, ondansetron **OR** ondansetron (ZOFRAN) IV, mouth rinse, mouth rinse, oxyCODONE, sodium chloride flush, traZODone  Current Outpatient Medications  Medication Instructions   acetaminophen (TYLENOL) 650 mg, Oral, Every 4 hours PRN   amLODipine (NORVASC) 10 mg, Oral, Daily   antiseptic oral rinse (BIOTENE) LIQD 15 mLs, Mouth Rinse, 2 times daily   ascorbic acid (VITAMIN C) 500 mg, Oral, Daily   colchicine 1.2 mg, Oral, Daily   diclofenac Sodium (VOLTAREN) 2 g, Topical, 4 times daily   DULoxetine (CYMBALTA) 60 mg, Oral, Daily   ferrous sulfate 325 mg, Oral, Daily with breakfast   folic acid (FOLVITE) 1 mg,  Oral, Daily   gabapentin (NEURONTIN) 100 mg, Oral, 3 times daily   Multiple Vitamin (MULTIVITAMIN WITH MINERALS) TABS tablet 1 tablet, Oral, Daily   Nutritional Supplements (PROMOD) LIQD 30 mLs, Oral, 2 times daily   omeprazole (PRILOSEC) 20 mg, Oral, Daily   oxybutynin (DITROPAN XL) 15 mg, Oral, Daily at bedtime   promethazine (PHENERGAN) 25 mg, Oral, Every 6 hours PRN   senna (SENOKOT) 8.6 MG TABS tablet 1 tablet, Oral, Daily at bedtime   sodium chloride irrigation 0.9 % irrigation 75 mLs, Irrigation, See admin instructions, Use 75 mL/hr intravenously every shift for fluid resuscitation for 2 days   traMADol (ULTRAM) 50 mg, Oral, 3 times daily PRN   triamterene-hydrochlorothiazide (MAXZIDE-25) 37.5-25 MG tablet 1 tablet, Oral, Daily   zinc gluconate 50 mg, Oral, Daily    Diet Orders (From admission, onward)     Start     Ordered   01/18/23 0001  Diet NPO time specified Except for: Sips with Meds  Diet effective midnight       Question:  Except for  Answer:  Sips with Meds   01/17/23 1727            DVT prophylaxis: enoxaparin (LOVENOX) injection 40 mg Start:  01/08/23 2200   Lab Results  Component Value Date   PLT 280 01/18/2023      Code Status: Full Code  Family Communication: no family at bedside   Status is: Inpatient Remains inpatient appropriate because: severity of illness  Level of care: Med-Surg  Consultants:  Palliative care General surgery GI  Objective: Vitals:   01/18/23 1407 01/18/23 1623 01/18/23 2055 01/19/23 0819  BP: 132/70 135/76 132/71 108/60  Pulse: 88 93 89 91  Resp: 17 17 17 17   Temp: 98.2 F (36.8 C) 98.1 F (36.7 C) 98.2 F (36.8 C) 98.5 F (36.9 C)  TempSrc: Oral Oral Oral   SpO2: 100% 100% 100% 100%  Weight:      Height:        Intake/Output Summary (Last 24 hours) at 01/19/2023 1018 Last data filed at 01/19/2023 0900 Gross per 24 hour  Intake 2623.68 ml  Output 850 ml  Net 1773.68 ml   Wt Readings from Last 3  Encounters:  01/18/23 104.5 kg  11/14/22 114.6 kg  10/27/22 119.1 kg    Examination:  Constitutional: NAD Eyes: no scleral icterus ENMT: Mucous membranes are moist.  Neck: normal, supple Respiratory: clear to auscultation bilaterally, no wheezing, no crackles. Normal respiratory effort. No accessory muscle use.  Cardiovascular: Regular rate and rhythm, no murmurs / rubs / gallops. No LE edema.  Abdomen: non distended, no tenderness. Bowel sounds positive.  Musculoskeletal: no clubbing / cyanosis.   Data Reviewed: I have independently reviewed following labs and imaging studies   CBC Recent Labs  Lab 01/13/23 0453 01/16/23 0428 01/17/23 0452 01/18/23 0433  WBC 5.3 10.8* 12.7* 8.2  HGB 9.6* 9.9* 8.2* 8.3*  HCT 30.6* 32.6* 25.4* 25.7*  PLT 214 262 233 280  MCV 85.0 85.6 84.1 81.8  MCH 26.7 26.0 27.2 26.4  MCHC 31.4 30.4 32.3 32.3  RDW 22.0* 22.4* 22.0* 21.7*    Recent Labs  Lab 01/13/23 0453 01/14/23 0432 01/15/23 0439 01/16/23 0428 01/16/23 0803 01/16/23 1008 01/17/23 0452 01/18/23 0433  NA 138 138 141 140  --   --  138 140  K 3.6 4.0 3.8 3.4*  --   --  3.2* 3.3*  CL 114* 113* 116* 117*  --   --  104 111  CO2 17* 16* 16* 15*  --   --  28 20*  GLUCOSE 87 90 88 106*  --   --  397* 76  BUN 23 19 14 13   --   --  18 20  CREATININE 0.98 0.86 0.85 0.99  --   --  1.11* 1.22*  CALCIUM 8.4* 8.5* 8.7* 8.3*  --   --  7.1* 7.6*  AST  --   --   --   --   --   --  13*  --   ALT  --   --   --   --   --   --  14  --   ALKPHOS  --   --   --   --   --   --  106  --   BILITOT  --   --   --   --   --   --  0.7  --   ALBUMIN  --   --   --   --   --   --  1.7*  --   MG 2.0  --   --   --   --   --  1.2* 1.9  PROCALCITON  --   --   --   --  0.17  --   --   --   LATICACIDVEN  --   --   --   --  1.1 2.1*  --   --   AMMONIA 18  --   --   --   --   --   --   --     ------------------------------------------------------------------------------------------------------------------ No  results for input(s): "CHOL", "HDL", "LDLCALC", "TRIG", "CHOLHDL", "LDLDIRECT" in the last 72 hours.  Lab Results  Component Value Date   HGBA1C 6.3 (H) 09/01/2022   ------------------------------------------------------------------------------------------------------------------ No results for input(s): "TSH", "T4TOTAL", "T3FREE", "THYROIDAB" in the last 72 hours.  Invalid input(s): "FREET3"  Cardiac Enzymes No results for input(s): "CKMB", "TROPONINI", "MYOGLOBIN" in the last 168 hours.  Invalid input(s): "CK" ------------------------------------------------------------------------------------------------------------------ No results found for: "BNP"  CBG: Recent Labs  Lab 01/18/23 1848 01/18/23 1850 01/18/23 2106 01/19/23 0653 01/19/23 0821  GLUCAP 59* 66* 76 74 73    Recent Results (from the past 240 hour(s))  Urine Culture     Status: Abnormal   Collection Time: 01/14/23  6:30 AM   Specimen: Urine, Random  Result Value Ref Range Status   Specimen Description   Final    URINE, RANDOM Performed at [facility], [address] [place], [place] [address]    Special Requests   Final    NONE Reflexed from [id number] Performed at [name], [address], [place], [place] [address]    Culture (A)  Final    >=100,000 COLONIES/mL ESCHERICHIA COLI Confirmed Extended Spectrum Beta-Lactamase Producer (ESBL).  In bloodstream infections from ESBL organisms, carbapenems are preferred over piperacillin/tazobactam. They are shown to have a lower risk of mortality.    Report Status 01/16/2023 FINAL  Final   Organism ID, Bacteria ESCHERICHIA COLI (A)  Final      Susceptibility   Escherichia coli - MIC*    AMPICILLIN >=32 RESISTANT Resistant     CEFAZOLIN >=64 RESISTANT Resistant     CEFEPIME >=32 RESISTANT Resistant     CEFTRIAXONE >=64 RESISTANT Resistant     CIPROFLOXACIN >=4 RESISTANT Resistant     GENTAMICIN >=16 RESISTANT Resistant     IMIPENEM <=0.25 SENSITIVE Sensitive      NITROFURANTOIN <=16 SENSITIVE Sensitive     TRIMETH/SULFA >=320 RESISTANT Resistant     AMPICILLIN/SULBACTAM 16 INTERMEDIATE Intermediate     PIP/TAZO <=4 SENSITIVE Sensitive     * >=100,000 COLONIES/mL ESCHERICHIA COLI  Culture, blood (Routine X 2) w Reflex to ID Panel     Status: None (Preliminary result)   Collection Time: 01/16/23  8:03 AM   Specimen: Right Antecubital; Blood  Result Value Ref Range Status   Specimen Description   Final    RIGHT ANTECUBITAL BOTTLES DRAWN AEROBIC AND ANAEROBIC   Special Requests   Final    Blood Culture results may not be optimal due to an excessive volume of blood received in culture bottles   Culture   Final    NO GROWTH 3 DAYS Performed at [facility], [address] [place], [place] [address]    Report Status PENDING  Incomplete  Culture, blood (Routine X 2) w Reflex to ID Panel     Status: None (Preliminary result)   Collection Time: 01/16/23  8:03 AM   Specimen: BLOOD RIGHT HAND  Result Value Ref Range Status   Specimen Description   Final    BLOOD RIGHT HAND BOTTLES DRAWN AEROBIC AND ANAEROBIC  Special Requests   Final    Blood Culture results may not be optimal due to an excessive volume of blood received in culture bottles   Culture   Final    NO GROWTH 3 DAYS Performed at [facility], [address] [place], [place] [address]    Report Status PENDING  Incomplete     Radiology Studies: No results found.   Pamella Pert, MD, PhD [facility]  Between 7 am - 7 pm I am available, please contact me via Amion (for emergencies) or Securechat (non urgent messages)  Between 7 pm - 7 am I am not available, please contact night coverage MD/APP via Amion

## 2023-01-19 NOTE — Progress Notes (Signed)
Physical Therapy Treatment Patient Details Name: Nolynn Benac MRN: [id number] DOB: 01-Apr-1951 Today's Date: 01/19/2023   History of Present Illness Nasir Stoltman is a 72 y/o female with hypertension, hyperlipidemia, OSA on CPAP, GERD, anxiety disorder, anemia, morbid obesity 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 Dossie Der.  She has been progressively declining since surgery.  She has had multiple hospitalizations and ED visits in past several months.  Recently seen at [facility] on 01/03/23 with complaints of weakness and poor oral intake.  She was hospitalized in July and August of this year at different facilities for dehydration, weakness, acute renal failure and failure to thrive.  She has been residing in a SNF.  She apparently has not been eating or drinking and refusing to take her medications.  She was sent to ER today for evaluation of altered mentation.  She has had 3 days of progressive weakness and refusing oral intake.  She complains of pain all over body which is not new per record review. Pt is a poor historian.   She was found to be severely dehydrated with hypoglycemia and hypernatremic with a sodium of 157.  She also had acute kidney injury and distended gallbladder on CT scan.  CT head with no acute findings.  Pt was started on IV hydration and admission was requested.    PT Comments  Pt with poor tolerance to treatment today. Pt with slow labored movement for bed mobility. Pt required frequent rest breaks but no physical assist provided however pt required constant cues to stay on task. After moving BLE a few feet towards the EOB then pt stated that she could go no further and requested to go back to bed. Pt cognition appears to have slightly improved compared to notes from previous sessions. No change in DC/DME recs at this time. PT will continue to follow.     If plan is discharge home, recommend the following: A lot of help with walking and/or transfers;A  lot of help with bathing/dressing/bathroom;Help with stairs or ramp for entrance;Assistance with cooking/housework   Can travel by private vehicle     No  Equipment Recommendations  Other (comment) (Per accepting facility)    Recommendations for Other Services       Precautions / Restrictions Precautions Precautions: Fall Restrictions Weight Bearing Restrictions: No     Mobility  Bed Mobility Overal bed mobility: Needs Assistance Bed Mobility: Supine to Sit, Sit to Supine     Supine to sit: Contact guard Sit to supine: Contact guard assist   General bed mobility comments: slow labored movement, required frequent rest breaks, no physical assist provided however pt required constant cues to stay on task. After moving BLE a few feet then pt stated that she could go no further and requested to go back to bed.    Transfers                   General transfer comment: Pt declined    Ambulation/Gait               General Gait Details: Pt unable   Stairs             Wheelchair Mobility     Tilt Bed    Modified Rankin (Stroke Patients Only)       Balance Overall balance assessment: Needs assistance Sitting-balance support: Feet supported, No upper extremity supported Sitting balance-Leahy Scale: Fair Sitting balance - Comments: fair/good seated at EOB  Cognition Arousal: Alert Behavior During Therapy: Flat affect, WFL for tasks assessed/performed Overall Cognitive Status: No family/caregiver present to determine baseline cognitive functioning                                 General Comments: Pt cognition appeared to have improved based on previous notes however pt still required increased time to answer questions and follow commands.        Exercises      General Comments General comments (skin integrity, edema, etc.): VSS      Pertinent Vitals/Pain Pain  Assessment Pain Assessment: Faces Faces Pain Scale: Hurts even more Pain Location: LLE and over bottom when scooting to edge of bed Pain Descriptors / Indicators: Sore, Grimacing, Discomfort, Moaning Pain Intervention(s): Monitored during session, Limited activity within patient's tolerance    Home Living                          Prior Function            PT Goals (current goals can now be found in the care plan section) Progress towards PT goals: Not progressing toward goals - comment (Pt appears to be declining)    Frequency    Min 1X/week      PT Plan      Co-evaluation              AM-PAC PT "6 Clicks" Mobility   Outcome Measure  Help needed turning from your back to your side while in a flat bed without using bedrails?: A Little Help needed moving from lying on your back to sitting on the side of a flat bed without using bedrails?: A Lot Help needed moving to and from a bed to a chair (including a wheelchair)?: Total Help needed standing up from a chair using your arms (e.g., wheelchair or bedside chair)?: Total Help needed to walk in hospital room?: Total Help needed climbing 3-5 steps with a railing? : Total 6 Click Score: 9    End of Session   Activity Tolerance: Patient limited by fatigue;Patient limited by pain Patient left: in bed;with call bell/phone within reach;with nursing/sitter in room Nurse Communication: Mobility status PT Visit Diagnosis: Unsteadiness on feet (R26.81);Other abnormalities of gait and mobility (R26.89);Muscle weakness (generalized) (M62.81)     Time: 6578-4696 PT Time Calculation (min) (ACUTE ONLY): 18 min  Charges:    $Therapeutic Activity: 8-22 mins PT General Charges $$ ACUTE PT VISIT: 1 Visit                     Shela Nevin, PT, DPT Acute Rehab Services [phone]    [name] 01/19/2023, 4:02 PM

## 2023-01-19 NOTE — Progress Notes (Signed)
PHARMACY NOTE:  ANTIMICROBIAL RENAL DOSAGE ADJUSTMENT  Current antimicrobial regimen includes a mismatch between antimicrobial dosage and estimated renal function.  As per policy approved by the Pharmacy & Therapeutics and Medical Executive Committees, the antimicrobial dosage will be adjusted accordingly.  Current antimicrobial dosage:  meropenem 1g IV q8 hours  Indication: ESBL EColi  Renal Function:  Estimated Creatinine Clearance: 48.4 mL/min (A) (by C-G formula based on SCr of 1.22 mg/dL (H)). []      On intermittent HD, scheduled: []      On CRRT    Antimicrobial dosage has been changed to:  meropenem 1g IV q12 hours  Additional comments:   Thank you for allowing pharmacy to be a part of this patient's care.  Rexford Maus, PharmD, BCPS 01/19/2023 1:45 PM

## 2023-01-19 NOTE — Progress Notes (Signed)
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  Has had multiple hospitalizations and ED visits in the past several months.  Postoperative course complicated by very poor oral intake, weakness, currently residing in an SNF.  She has refused to eat, drink, and take her medications.  She was admitted to [facility] with confusion.  GI was consulted and did not find an organic cause for her not eating.  After discussing with the family, a feeding tube was recommended and she was transferred to [facility] for surgery consultation. Palliative care has been asked to continue involvement to support patient and further address goals of care as it relates to possible PEG placement,   Today's Discussion 01/19/2023  *Please note that this is a verbal dictation therefore any spelling or grammatical errors are due to the "Dragon [employer] One" system interpretation.  Chart reviewed inclusive of vital signs, progress notes, laboratory results, and diagnostic images.   I met with Christine [name] at bedside this afternoon. We discussed that she is feeling the "most clear minded" that she has been in days. She shares that she woke up this morning feeling like a different person.   Created space and opportunity for patient to explore thoughts feelings and fears regarding current medical situation.She shares that she has considered a PEG though does not desire this. She shares in the past she had bariatric surgery and does not want to get a PEG. She does express a strong desire to eat and drink this afternoon and asks for assistance ordering her lunch.   Patients sister, Christine [name] called on speaker-phone. She shares that this is the best she has heard Christine [name]. She also goes on to say that if Christine [name] does not want a feeding tube then she needs  to show she can eat and drink sufficiently. Christine [name] is aware that if her appetite remains poor this topic will need to be readdressed.   Christine [name] again shares her desire to eat. She does complain food tastes, "gummy". I shared that I would let the primary team know this but also assist in management. Discussed the importance of good oral care.  Plan for nutrition consult and a calorie count. Patients RN, Christine [name] is aware to keep a close eye on PO's.   Questions and concerns addressed/Palliative Support Provided.   Objective Assessment: Vital Signs Vitals:   01/18/23 2055 01/19/23 0819  BP: 132/71 108/60  Pulse: 89 91  Resp: 17 17  Temp: 98.2 F (36.8 C) 98.5 F (36.9 C)  SpO2: 100% 100%    Intake/Output Summary (Last 24 hours) at 01/19/2023 1308 Last data filed at 01/19/2023 0900 Gross per 24 hour  Intake 2623.68 ml  Output 850 ml  Net 1773.68 ml   Last Weight  Most recent update: 01/18/2023  3:48 AM    Weight  104.5 kg (230 lb 6.1 oz)            Gen:  Elderly AA F in NAD HEENT: Dry mucous membranes CV: Regular rate and rhythm  PULM: On RA, breathing is even and nonlabored ABD: soft/nontender EXT: (+) edema  Neuro: Alert and oriented x3   SUMMARY OF RECOMMENDATIONS   Full code / full scope of care  Patient would like to eat and drink on her own, she is feeling greatly improved today --> Christine [name] is aware if  her appetite remains to be dwindling we will need to readdress artificial nutrition  Appreciate Nutrition consult and guidance  --> Ordered 48 hour calorie count  Appreciate PT/OT for mobility efforts  Nursing to provide oral care TID  Ongoing PMT support  Time Spent: 65 Billing based on MDM: High  Problems Addressed: One acute or chronic illness or injury that poses a threat to life or bodily function  Amount and/or Complexity of Data: Category 3:Discussion of management or test interpretation with external physician/other qualified health care  professional/appropriate source (not separately reported)  Risks: Decision regarding elective major surgery with identified patient or procedure risk factors ______________________________________________________________________________________ Lamarr Lulas [facility] Team Team Cell Phone: [phone] Please utilize secure chat with additional questions, if there is no response within 30 minutes please call the above phone number  Palliative Medicine Team providers are available by phone from 7am to 7pm daily and can be reached through the team cell phone.  Should this patient require assistance outside of these hours, please call the patient's attending physician.

## 2023-01-19 NOTE — Plan of Care (Signed)
  Problem: Activity: Goal: Ability to tolerate increased activity will improve Outcome: Progressing   Problem: Bowel/Gastric: Goal: Occurrences of nausea will decrease Outcome: Progressing   Problem: Clinical Measurements: Goal: Will remain free from infection Outcome: Progressing   Problem: Pain Management: Goal: Pain level will decrease Outcome: Progressing

## 2023-01-19 NOTE — Plan of Care (Signed)
Problem: Education: Goal: Ability to state signs and symptoms to report to health care provider will improve 01/19/2023 1242 by Johnney Killian, RN Outcome: Not Progressing 01/19/2023 1241 by Johnney Killian, RN Outcome: Progressing Goal: Knowledge of the prescribed self-care regimen will improve 01/19/2023 1242 by Johnney Killian, RN Outcome: Not Progressing 01/19/2023 1241 by Johnney Killian, RN Outcome: Progressing Goal: Knowledge of discharge needs will improve 01/19/2023 1242 by Johnney Killian, RN Outcome: Not Progressing 01/19/2023 1241 by Johnney Killian, RN Outcome: Progressing   Problem: Activity: Goal: Ability to tolerate increased activity will improve 01/19/2023 1242 by Johnney Killian, RN Outcome: Not Progressing 01/19/2023 1241 by Johnney Killian, RN Outcome: Progressing   Problem: Bowel/Gastric: Goal: Gastrointestinal status for postoperative course will improve 01/19/2023 1242 by Johnney Killian, RN Outcome: Not Progressing 01/19/2023 1241 by Johnney Killian, RN Outcome: Progressing Goal: Occurrences of nausea will decrease 01/19/2023 1242 by Johnney Killian, RN Outcome: Not Progressing 01/19/2023 1241 by Johnney Killian, RN Outcome: Progressing   Problem: Coping: Goal: Development of coping mechanisms to deal with changes in body function or appearance will improve 01/19/2023 1242 by Johnney Killian, RN Outcome: Not Progressing 01/19/2023 1241 by Johnney Killian, RN Outcome: Progressing   Problem: Fluid Volume: Goal: Maintenance of adequate hydration will improve 01/19/2023 1242 by Johnney Killian, RN Outcome: Not Progressing 01/19/2023 1241 by Johnney Killian, RN Outcome: Progressing   Problem: Nutritional: Goal: Nutritional status will improve 01/19/2023 1242 by Johnney Killian, RN Outcome: Not Progressing 01/19/2023 1241 by Johnney Killian, RN Outcome: Progressing   Problem: Clinical Measurements: Goal: Will show no signs or symptoms of venous  thromboembolism 01/19/2023 1242 by Johnney Killian, RN Outcome: Not Progressing 01/19/2023 1241 by Johnney Killian, RN Outcome: Progressing Goal: Will remain free from infection 01/19/2023 1242 by Johnney Killian, RN Outcome: Not Progressing 01/19/2023 1241 by Johnney Killian, RN Outcome: Progressing Goal: Will show no signs of GI Leak 01/19/2023 1242 by Johnney Killian, RN Outcome: Not Progressing 01/19/2023 1241 by Johnney Killian, RN Outcome: Progressing   Problem: Respiratory: Goal: Will regain and/or maintain adequate ventilation 01/19/2023 1242 by Johnney Killian, RN Outcome: Not Progressing 01/19/2023 1241 by Johnney Killian, RN Outcome: Progressing   Problem: Pain Management: Goal: Pain level will decrease 01/19/2023 1242 by Johnney Killian, RN Outcome: Not Progressing 01/19/2023 1241 by Johnney Killian, RN Outcome: Progressing   Problem: Skin Integrity: Goal: Demonstration of wound healing without infection will improve 01/19/2023 1242 by Johnney Killian, RN Outcome: Not Progressing 01/19/2023 1241 by Johnney Killian, RN Outcome: Progressing   Problem: Education: Goal: Knowledge of General Education information will improve Description: Including pain rating scale, medication(s)/side effects and non-pharmacologic comfort measures 01/19/2023 1242 by Johnney Killian, RN Outcome: Not Progressing 01/19/2023 1241 by Johnney Killian, RN Outcome: Progressing   Problem: Health Behavior/Discharge Planning: Goal: Ability to manage health-related needs will improve 01/19/2023 1242 by Johnney Killian, RN Outcome: Not Progressing 01/19/2023 1241 by Johnney Killian, RN Outcome: Progressing   Problem: Clinical Measurements: Goal: Ability to maintain clinical measurements within normal limits will improve 01/19/2023 1242 by Johnney Killian, RN Outcome: Not Progressing 01/19/2023 1241 by Johnney Killian, RN Outcome: Progressing Goal: Will remain free from infection 01/19/2023 1242 by  Johnney Killian, RN Outcome: Not Progressing 01/19/2023 1241 by Johnney Killian, RN Outcome: Progressing Goal: Diagnostic test results will improve 01/19/2023 1242 by Tresa Endo,  Hayden Pedro, RN Outcome: Not Progressing 01/19/2023 1241 by Johnney Killian, RN Outcome: Progressing Goal: Respiratory complications will improve 01/19/2023 1242 by Johnney Killian, RN Outcome: Not Progressing 01/19/2023 1241 by Johnney Killian, RN Outcome: Progressing Goal: Cardiovascular complication will be avoided 01/19/2023 1242 by Johnney Killian, RN Outcome: Not Progressing 01/19/2023 1241 by Johnney Killian, RN Outcome: Progressing   Problem: Activity: Goal: Risk for activity intolerance will decrease 01/19/2023 1242 by Johnney Killian, RN Outcome: Not Progressing 01/19/2023 1241 by Johnney Killian, RN Outcome: Progressing   Problem: Nutrition: Goal: Adequate nutrition will be maintained 01/19/2023 1242 by Johnney Killian, RN Outcome: Not Progressing 01/19/2023 1241 by Johnney Killian, RN Outcome: Progressing   Problem: Coping: Goal: Level of anxiety will decrease 01/19/2023 1242 by Johnney Killian, RN Outcome: Not Progressing 01/19/2023 1241 by Johnney Killian, RN Outcome: Progressing   Problem: Elimination: Goal: Will not experience complications related to bowel motility 01/19/2023 1242 by Johnney Killian, RN Outcome: Not Progressing 01/19/2023 1241 by Johnney Killian, RN Outcome: Progressing Goal: Will not experience complications related to urinary retention 01/19/2023 1242 by Johnney Killian, RN Outcome: Not Progressing 01/19/2023 1241 by Johnney Killian, RN Outcome: Progressing   Problem: Pain Managment: Goal: General experience of comfort will improve 01/19/2023 1242 by Johnney Killian, RN Outcome: Not Progressing 01/19/2023 1241 by Johnney Killian, RN Outcome: Progressing   Problem: Safety: Goal: Ability to remain free from injury will improve 01/19/2023 1242 by Johnney Killian,  RN Outcome: Not Progressing 01/19/2023 1241 by Johnney Killian, RN Outcome: Progressing   Problem: Skin Integrity: Goal: Risk for impaired skin integrity will decrease 01/19/2023 1242 by Johnney Killian, RN Outcome: Not Progressing 01/19/2023 1241 by Johnney Killian, RN Outcome: Progressing

## 2023-01-19 NOTE — Care Management Important Message (Signed)
Important Message  Patient Details  Name: Christine [name] MRN: [id number] Date of Birth: [birth year]   Important Message Given:  Yes - Medicare IM     Sherilyn Banker 01/19/2023, 1:39 PM

## 2023-01-19 NOTE — Consult Note (Signed)
Consulting Physician: Hyman Hopes Darrien Belter  Referring Provider: Dr. 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   Subjective   HPI: Christine [name] is an 72 y.o. female who is here for failure to thrive after gastric bypass.    Christine [name] has had a very difficult course since her gastric bypass back in May.  She has been admitted to the hospital multiple times with failure to thrive.  She is refused to eat.  She has pocketed food in her cheek and then spit it out later.  She has been admitted to multiple hospitals with dehydration issues.  Her behavior has been erratic and she does not seem to be thinking clearly.  Today she seems to be thinking more clearly.  She would like to eat.  She describes an experience of seeing a man outside of her window who was not there.  She would like to avoid having NG tube placed if possible.  Past Medical History:  Diagnosis Date   Allergy    dust, pollen, sulfa, prednisone   Anemia    Anxiety    Arthritis    "knees" (04/26/2016)   Colon polyps    benign per pt   Coronary artery disease    mild per 2015 cath in [place] (OM1 30%, RCA 30%)   GERD (gastroesophageal reflux disease)    Gout    History of hiatal hernia    Hyperlipidemia 05/20/2021   Hypertension    Migraine    "none since early /2017" (05/06/2016)   Obesity    OSA on CPAP    uses CPAP   Pre-diabetes    Pre-operative cardiovascular examination 08/22/2008   Renal insufficiency 11/09/2022   Vasculitis (HCC)    Bilateral    Past Surgical History:  Procedure Laterality Date   ABDOMINAL HYSTERECTOMY     "partial"; both ovaries present   APPENDECTOMY  05/06/2016   BIOPSY  04/08/2022   Procedure: BIOPSY;  Surgeon: Sherrilyn Rist, MD;  Location: [facility];  Service: Gastroenterology;;   CARDIAC CATHETERIZATION  ~ 2015   CARDIAC CATHETERIZATION  2015   In [place]   CHILECTOMY Right 06/01/2017   Procedure:  CHILECTOMY RIGHT FOOT;  Surgeon: Felecia Shelling, DPM;  Location: [facility];  Service: Podiatry;  Laterality: Right;   ESOPHAGOGASTRODUODENOSCOPY (EGD) WITH PROPOFOL N/A 04/08/2022   Procedure: ESOPHAGOGASTRODUODENOSCOPY (EGD) WITH PROPOFOL;  Surgeon: Sherrilyn Rist, MD;  Location: [facility];  Service: Gastroenterology;  Laterality: N/A;   LAPAROSCOPIC APPENDECTOMY N/A 05/06/2016   Procedure: LAPAROSCOPIC APPENDECTOMY;  Surgeon: Manus Rudd, MD;  Location: [facility];  Service: General;  Laterality: N/A;   POLYPECTOMY  04/08/2022   Procedure: POLYPECTOMY;  Surgeon: Sherrilyn Rist, MD;  Location: [facility];  Service: Gastroenterology;;   TONSILLECTOMY      Family History  Problem Relation Age of Onset   Diabetes Mother    Heart disease Mother    High blood pressure Mother    Asthma Mother    Arthritis Mother    Diabetes Father    Heart disease Father    High blood pressure Father    Asthma Father    Diabetes Sister    Alcohol abuse Brother    Drug abuse Brother    Arthritis Sister    Diabetes Sister    Varicose Veins Sister    Colon cancer Neg Hx    Esophageal cancer Neg Hx    Rectal  cancer Neg Hx     Social:  reports that she quit smoking about 44 years ago. Her smoking use included cigarettes. She started smoking about 49 years ago. She has a 0.6 pack-year smoking history. She has never used smokeless tobacco. She reports that she does not drink alcohol and does not use drugs.  Allergies:  Allergies  Allergen Reactions   Prednisone Swelling    Legs swell    Tape Hives   Sulfa Antibiotics Rash    Medications: Current Outpatient Medications  Medication Instructions   acetaminophen (TYLENOL) 650 mg, Oral, Every 4 hours PRN   amLODipine (NORVASC) 10 mg, Oral, Daily   antiseptic oral rinse (BIOTENE) LIQD 15 mLs, Mouth Rinse, 2 times daily   ascorbic acid (VITAMIN C) 500 mg, Oral, Daily   colchicine 1.2 mg, Oral, Daily   diclofenac Sodium (VOLTAREN) 2 g, Topical, 4  times daily   DULoxetine (CYMBALTA) 60 mg, Oral, Daily   ferrous sulfate 325 mg, Oral, Daily with breakfast   folic acid (FOLVITE) 1 mg, Oral, Daily   gabapentin (NEURONTIN) 100 mg, Oral, 3 times daily   Multiple Vitamin (MULTIVITAMIN WITH MINERALS) TABS tablet 1 tablet, Oral, Daily   Nutritional Supplements (PROMOD) LIQD 30 mLs, Oral, 2 times daily   omeprazole (PRILOSEC) 20 mg, Oral, Daily   oxybutynin (DITROPAN XL) 15 mg, Oral, Daily at bedtime   promethazine (PHENERGAN) 25 mg, Oral, Every 6 hours PRN   senna (SENOKOT) 8.6 MG TABS tablet 1 tablet, Oral, Daily at bedtime   sodium chloride irrigation 0.9 % irrigation 75 mLs, Irrigation, See admin instructions, Use 75 mL/hr intravenously every shift for fluid resuscitation for 2 days   traMADol (ULTRAM) 50 mg, Oral, 3 times daily PRN   triamterene-hydrochlorothiazide (MAXZIDE-25) 37.5-25 MG tablet 1 tablet, Oral, Daily   zinc gluconate 50 mg, Oral, Daily    ROS - all of the below systems have been reviewed with the patient and positives are indicated with bold text General: chills, fever or night sweats Eyes: blurry vision or double vision ENT: epistaxis or sore throat Allergy/Immunology: itchy/watery eyes or nasal congestion Hematologic/Lymphatic: bleeding problems, blood clots or swollen lymph nodes Endocrine: temperature intolerance or unexpected weight changes Breast: new or changing breast lumps or nipple discharge Resp: cough, shortness of breath, or wheezing CV: chest pain or dyspnea on exertion GI: as per HPI GU: dysuria, trouble voiding, or hematuria MSK: joint pain or joint stiffness Neuro: TIA or stroke symptoms Derm: pruritus and skin lesion changes Psych: anxiety and depression  Objective   PE Blood pressure 108/60, pulse 91, temperature 98.5 F (36.9 C), resp. rate 17, height 5\' 1"  (1.549 m), weight 104.5 kg, SpO2 100%. Constitutional: NAD; conversant; no deformities Eyes: Moist conjunctiva; no lid lag;  anicteric; PERRL Neck: Trachea midline; no thyromegaly Lungs: Normal respiratory effort; no tactile fremitus CV: RRR; no palpable thrills; no pitting edema GI: Abd soft, nontender; no palpable hepatosplenomegaly MSK: Normal range of motion of extremities; no clubbing/cyanosis Psychiatric: Appropriate affect; alert and oriented x3 Lymphatic: No palpable cervical or axillary lymphadenopathy  Results for orders placed or performed during the hospital encounter of 01/08/23 (from the past 24 hour(s))  Glucose, capillary     Status: Abnormal   Collection Time: 01/18/23  6:48 PM  Result Value Ref Range   Glucose-Capillary 59 (L) 70 - 99 mg/dL   Comment 1 Document in Chart   Glucose, capillary     Status: Abnormal   Collection Time: 01/18/23  6:50 PM  Result  Value Ref Range   Glucose-Capillary 66 (L) 70 - 99 mg/dL  Glucose, capillary     Status: None   Collection Time: 01/18/23  9:06 PM  Result Value Ref Range   Glucose-Capillary 76 70 - 99 mg/dL  Glucose, capillary     Status: None   Collection Time: 01/19/23  6:53 AM  Result Value Ref Range   Glucose-Capillary 74 70 - 99 mg/dL  Glucose, capillary     Status: None   Collection Time: 01/19/23  8:21 AM  Result Value Ref Range   Glucose-Capillary 73 70 - 99 mg/dL  Glucose, capillary     Status: Abnormal   Collection Time: 01/19/23 12:18 PM  Result Value Ref Range   Glucose-Capillary 61 (L) 70 - 99 mg/dL     Imaging Orders         CT Head Wo Contrast 01/08/23 No acute intracranial pathology.  Small-vessel white matter disease.           CT ABDOMEN PELVIS WO CONTRAST   01/08/23 Distended gallbladder. If there is concern of gallbladder pathology ultrasound may be useful as the next step in the workup. Grossly stable left-sided renal cystic foci. Surgical changes of previous gastric bypass Markedly limited examination due to the level of motion. No obvious obstruction, free air or free fluid. In addition, a repeat study could be  considered when the patient is more clinically able.       US Abdomen Complete 01/09/23 1. Hepatic fatty infiltration. 2. Gallbladder distended. 3. Minimal gallbladder sludge. 4. Echogenic kidneys consistent with chronic medical renal disease. 5. Left kidney cyst. 6. There was limited visualization, as described.        DG ESOPHAGUS W SINGLE CM (SOL OR THIN BA)  01/14/23 1. 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 Further evaluation with endoscopy is recommended. 2. Significant delay in swallow initiation/mechanism. Prominent cricopharyngeal bar. 3. Advanced esophageal dysmotility with spasms. 4. Limited study due to patient's body habitus and mobility limitations.         CT CHEST W CONTRAST   01/15/23 1. No acute cardiopulmonary process. 2. Minimal atelectasis in the left lower lobe.       NM Hepato W/EF   01/17/23 1.  Patent cystic and common bile ducts. 2. Elevated gallbladder ejection fraction as can be seen with gallbladder hyperkinesis.  Symptoms recreated during HIDA scan     Assessment and Plan   Christine [name] is an 72 y.o. female who underwent robotic gastric bypass on 09/14/2022.  She has had a very difficult course postoperatively with multiple hospitalizations.  She has not been receiving sufficient nutrition.  She is losing weight slightly faster than expected, and she has some mild vitamin mineral deficiencies on her multiple previous workups.  More notable, she has presented multiple times dehydrated to different hospitals.  In general she is not doing well postoperatively and not sure exactly why.  I reviewed the CT of the head, CT of the abdomen pelvis, CT ultrasound the abdomen, esophagram, CT of the chest, and HIDA scan as detailed above.  She has some esophageal dysmotility and possibly a esophageal diverticulum, but this should not inhibit her ability to eat.  She has some  findings of chronic cholecystitis issues, but she is not complaining of significant abdominal pain so this also should not inhibit her ability to eat.  With all of this in mind, I think taking her to surgery for a  laparoscopic remnant gastrostomy tube with cholecystectomy and upper endoscopy could be warranted.  Though I am still unsure exactly what is causing her inability to eat and behavioral abnormalities that have been noticed by healthcare providers and her sister.  Talking to her sister, it sounds her behavior became erratic immediately postoperatively.  The patient was trying to eat food immediately postoperatively that her sister knows she was not supposed to be eating.  The patient was also taking nonsteroidal anti-inflammatory medications postoperatively when she had been clearly been instructed to avoid these.  When this was brought up in the office her mood was erratic and she was argumentative, which was not similar to my experiences interacting with her preoperatively.  She did go through a preoperative psychiatric evaluation.  She does take Cymbalta at baseline, and may not be as well absorbed with a gastric bypass causing some withdrawal symptoms.  I am unsure if there is a underlying medical and/or psychiatric explanation for her behavior.  I am curious to see what her thiamine level shows when that result is back.  However, in general her nutrition is lagging behind where it should be postoperatively but does not appear to be terrible, and she states she has been taking her bariatric multivitamin.  Thiamine stores do only last a few weeks however.  Here are some nutritional parameters:  289 LBS to 230 LBS = 59 LBS weight loss since surgery 47 pound weight loss expected 4 months after gastric bypass, please see graph from ASMBS risk calculator below. Albumin 3.0 on presentation to hospital on 01/03/23 Vitamin D barely low at 29.9 TSH normal Folate normal 11.5, Iron/TIBC/Saturation  47/176/27 Vitamin B12 1183 Thiamin pending     For now I would like to observe her in the hospital.  I would like to provide a regular bariatric diet to see how she does with the eating.  I would like her to be evaluated by psychiatry as an concern for psychiatric diagnoses, vitamin deficiencies, or medication withdrawal issues contributing to her behavioral problems.  We may find that she does need to proceed with laparoscopic remnant gastrostomy tube placement, cholecystectomy, and upper endoscopy during this hospitalization.  Quentin Ore, MD  [facility] Use [email or web address] to contact on call provider  New Patient Billing: [id number] - High MDM

## 2023-01-19 NOTE — TOC Progression Note (Signed)
Transition of Care [facility]) - Progression Note    Patient Details  Name: Christine [name] MRN: [id number] Date of Birth: 10/05/1950  Transition of Care [facility] CM/SW Contact  Carley Hammed, LCSW Phone Number: 01/19/2023, 9:50 AM  Clinical Narrative:    CSW was advised that pt has been requesting to speak with CSW this morning. CSW met with pt at bedside. Pt was able to have a clear conversation. She states she has felt like she was in a fog, but is able to think this morning. Pt confirms which facility she was at and that she is planning to transition to [facility]. Pt states that her sister [name] is her HCPOA and to make sure there is paperwork on the chart. CSW confirmed pt has documentation for HCPOA and her living will uploaded.   Pt states she DOES NOT want a feeding tube. Pt states she is hungry and ready to try to eat. CSW spoke with Attending and surgery, they will meet with pt and family. CSW advised that prior to DC pt will need an insurance auth. Pt requested CSW speak with her sister, CSW will defer to medical team and follow up with sister afterwards. TOC will continue to follow.    Expected Discharge Plan: Skilled Nursing Facility Barriers to Discharge: Continued Medical Work up  Expected Discharge Plan and Services In-house Referral: Clinical Social Work Discharge Planning Services: CM Consult Post Acute Care Choice: Skilled Nursing Facility Living arrangements for the past 2 months: Skilled Nursing Facility                                       Social Determinants of Health (SDOH) Interventions SDOH Screenings   Food Insecurity: No Food Insecurity (01/18/2023)  Housing: Patient Declined (01/18/2023)  Transportation Needs: No Transportation Needs (01/18/2023)  Utilities: Not At Risk (01/18/2023)  Alcohol Screen: Low Risk  (07/13/2022)  Depression (PHQ2-9): Low Risk  (01/21/2022)  Financial Resource Strain: Low Risk  (12/01/2022)   Received [facility]   Physical Activity: Insufficiently Active (07/13/2022)  Social Connections: Moderately Integrated (12/01/2022)   Received from [facility]  Stress: No Stress Concern Present (07/13/2022)  Tobacco Use: Medium Risk (01/17/2023)  Health Literacy: Medium Risk (12/01/2022)   Received from [facility]    Readmission Risk Interventions    01/09/2023   11:11 AM  Readmission Risk Prevention Plan  Transportation Screening Complete  HRI or Home Care Consult Complete  Social Work Consult for Recovery Care Planning/Counseling Complete  Palliative Care Screening Not Applicable  Medication Review Oceanographer) Complete

## 2023-01-20 DIAGNOSIS — R627 Adult failure to thrive: Secondary | ICD-10-CM | POA: Diagnosis not present

## 2023-01-20 DIAGNOSIS — F32A Depression, unspecified: Secondary | ICD-10-CM | POA: Diagnosis not present

## 2023-01-20 DIAGNOSIS — E870 Hyperosmolality and hypernatremia: Secondary | ICD-10-CM | POA: Diagnosis not present

## 2023-01-20 DIAGNOSIS — Z7189 Other specified counseling: Secondary | ICD-10-CM | POA: Diagnosis not present

## 2023-01-20 DIAGNOSIS — F29 Unspecified psychosis not due to a substance or known physiological condition: Secondary | ICD-10-CM | POA: Diagnosis not present

## 2023-01-20 DIAGNOSIS — Z515 Encounter for palliative care: Secondary | ICD-10-CM | POA: Diagnosis not present

## 2023-01-20 LAB — CULTURE, BLOOD (ROUTINE X 2)
Culture: NO GROWTH
Culture: NO GROWTH

## 2023-01-20 LAB — CBC
HCT: 27.2 % — ABNORMAL LOW (ref 36.0–46.0)
Hemoglobin: 8.5 g/dL — ABNORMAL LOW (ref 12.0–15.0)
MCH: 26.2 pg (ref 26.0–34.0)
MCHC: 31.3 g/dL (ref 30.0–36.0)
MCV: 83.7 fL (ref 80.0–100.0)
Platelets: 311 10*3/uL (ref 150–400)
RBC: 3.25 MIL/uL — ABNORMAL LOW (ref 3.87–5.11)
RDW: 21.3 % — ABNORMAL HIGH (ref 11.5–15.5)
WBC: 5.5 10*3/uL (ref 4.0–10.5)
nRBC: 0 % (ref 0.0–0.2)

## 2023-01-20 LAB — PHOSPHORUS: Phosphorus: 3.6 mg/dL (ref 2.5–4.6)

## 2023-01-20 LAB — COMPREHENSIVE METABOLIC PANEL
ALT: 17 U/L (ref 0–44)
AST: 19 U/L (ref 15–41)
Albumin: 1.9 g/dL — ABNORMAL LOW (ref 3.5–5.0)
Alkaline Phosphatase: 107 U/L (ref 38–126)
Anion gap: 8 (ref 5–15)
BUN: 12 mg/dL (ref 8–23)
CO2: 21 mmol/L — ABNORMAL LOW (ref 22–32)
Calcium: 7.7 mg/dL — ABNORMAL LOW (ref 8.9–10.3)
Chloride: 110 mmol/L (ref 98–111)
Creatinine, Ser: 1.11 mg/dL — ABNORMAL HIGH (ref 0.44–1.00)
GFR, Estimated: 54 mL/min — ABNORMAL LOW (ref >60)
Glucose, Bld: 86 mg/dL (ref 70–99)
Potassium: 3.5 mmol/L (ref 3.5–5.1)
Sodium: 139 mmol/L (ref 135–145)
Total Bilirubin: 0.8 mg/dL (ref 0.3–1.2)
Total Protein: 5.1 g/dL — ABNORMAL LOW (ref 6.5–8.1)

## 2023-01-20 LAB — MAGNESIUM: Magnesium: 1.8 mg/dL (ref 1.7–2.4)

## 2023-01-20 LAB — GLUCOSE, CAPILLARY
Glucose-Capillary: 69 mg/dL — ABNORMAL LOW (ref 70–99)
Glucose-Capillary: 64 mg/dL — ABNORMAL LOW (ref 70–99)
Glucose-Capillary: 94 mg/dL (ref 70–99)

## 2023-01-20 MED ORDER — VITAMIN D 25 MCG (1000 UNIT) PO TABS
5000.0000 [IU] | ORAL_TABLET | Freq: Every day | ORAL | Status: DC
  Administered 2023-01-20 – 2023-01-25 (×6): 5000 [IU] via ORAL
  Filled 2023-01-20 (×6): qty 5

## 2023-01-20 MED ORDER — MEGESTROL ACETATE 400 MG/10ML PO SUSP
400.0000 mg | Freq: Every day | ORAL | Status: DC
  Administered 2023-01-20 – 2023-01-27 (×8): 400 mg via ORAL
  Filled 2023-01-20 (×9): qty 10

## 2023-01-20 MED ORDER — SODIUM CHLORIDE 0.9 % IV SOLN
1.0000 g | Freq: Three times a day (TID) | INTRAVENOUS | Status: DC
  Administered 2023-01-20 – 2023-01-24 (×12): 1 g via INTRAVENOUS
  Filled 2023-01-20 (×13): qty 20

## 2023-01-20 NOTE — Progress Notes (Signed)
Speech Language Pathology Treatment: Dysphagia  Patient Details Name: Christine [name] MRN: [id number] DOB: [birth year] Today's Date: 01/20/2023 Time: 7829-5621 SLP Time Calculation (min) (ACUTE ONLY): 13 min  Assessment / Plan / Recommendation Clinical Impression  F/u after initial clinical swallow assessment at [facility].  At that time, pt's mentation was interfering with her ability to attend to, masticate, and swallow food.  This has since resolved.  Today, pt was attentive to sips of water and allowed one bite of applesauce. There were no oral delays and no suggestions of aspiration or any oropharyngeal disorder. She does have esophageal dysmotility revealed on 9/209 esophagram.  Palliative Care is following and pt/family are still deciding about G-tube.  There are no further issues that warrant SLP involvement. No oropharyngeal dysphagia. Our service will sign off.   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 Dossie Der.  She has been progressively declining since surgery.  She has had multiple hospitalizations and ED visits in past several months.  Recently seen at [facility] on 01/03/23 with complaints of weakness and poor oral intake.  She was hospitalized in July and August of this year at different facilities for dehydration, weakness, acute renal failure and failure to thrive.  She has been residing in a SNF.  She apparently has not been eating or drinking and refusing to take her medications.  She was sent to ER for evaluation of altered mentation.  She has had 3 days of progressive weakness and refusing oral intake.  She complains of pain all over body which is not new per record review. Pt is a poor historian.   She was found to be severely dehydrated with hypoglycemia and hypernatremic with a sodium of 157.  She also had acute kidney injury and distended gallbladder  on CT scan.  CT head with no acute findings.  Pt was started on IV hydration and admission was requested. Swallow evaluated on 9/19 with oral issues related to AMS. Transferred to [facility] for PEG placement.      SLP Plan  All goals met      Recommendations for follow up therapy are one component of a multi-disciplinary discharge planning process, led by the attending physician.  Recommendations may be updated based on patient status, additional functional criteria and insurance authorization.    Recommendations  Diet recommendations: Regular;Thin liquid Liquids provided via: Straw;Cup Medication Administration: Whole meds with puree Supervision: Patient able to self feed                  Oral care BID     Dysphagia, unspecified (R13.10)     All goals met    Christine [name] L. Samson Frederic, MA CCC/SLP Clinical Specialist - Acute Care SLP Acute Rehabilitation Services Office number [phone]  Christine [name] Christine [name]  01/20/2023, 1:52 PM
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
## 2023-01-20 NOTE — Progress Notes (Signed)
Assume of care at 1900, report received from Raquel RN, Patient received in bed with bedside table in front of patient eating her salad but did not touch entree. She wanted a Glucerna( chocolate ) this Clinical research associate gve her a bottle. Denies pain or discomfort at this time. Continue to follow POC.All safety measures in place and Call bell within reach.

## 2023-01-20 NOTE — Progress Notes (Signed)
PHARMACY NOTE:  ANTIMICROBIAL RENAL DOSAGE ADJUSTMENT  Current antimicrobial regimen includes a mismatch between antimicrobial dosage and estimated renal function.  As per policy approved by the Pharmacy & Therapeutics and Medical Executive Committees, the antimicrobial dosage will be adjusted accordingly.  Current antimicrobial dosage:  meropenem 1g IV q12 hours  Indication: ESBL EColi  Renal Function:  Estimated Creatinine Clearance: 53.2 mL/min (A) (by C-G formula based on SCr of 1.11 mg/dL (H)). []      On intermittent HD, scheduled: []      On CRRT    Antimicrobial dosage has been changed to:  meropenem 1g IV q8 hours  Additional comments:   Thank you for allowing pharmacy to be a part of this patient's care.  Rexford Maus, PharmD, BCPS 01/20/2023 1:38 PM

## 2023-01-20 NOTE — Progress Notes (Signed)
Calorie Count Note- Day 1  48 hour calorie count ordered.  Diet: Regular Supplements: Glucerna Shakes TID, ProSource Plus BID, Carnation Instant Breakfast BID  Estimated Nutritional Needs:  Kcal:  1700-2000 Protein:  100-120 gm Fluid:  1.5-2 L  Breakfast: n/a; diet not advanced until lunch Lunch: 163 kcal and 8g protein Dinner: 268 kcal and 11g protein Breakfast: 109 kcal and 5g protein Supplements: 220 kcal and 10g protein  Total intake: 760 kcal (45% of minimum estimated needs)  24g protein (24% of minimum estimated needs)  Nutrition Dx: Increased nutrient needs related to wound healing as evidenced by estimated needs.   Goal: Patient will meet greater than or equal to 90% of their needs   Intervention:  48 hour calorie count Recommend continuing regular diet with emphasis on protein Protein containing snacks TID between meals Glucerna Shake po TID, each supplement provides 220 kcal and 10 grams of protein Discontinue ProSource Plus Carnation Breakfast Essentials BID, each packet mixed with 8 ounces of 2% milk provides 13 grams of protein and 260 calories. Bariatric vitamin regimen: MVI BID and TUMS TID  Drusilla Kanner, RDN, LDN Clinical Nutrition

## 2023-01-20 NOTE — Progress Notes (Signed)
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  Has had multiple hospitalizations and ED visits in the past several months.  Postoperative course complicated by very poor oral intake, weakness, currently residing in an SNF.  She has refused to eat, drink, and take her medications.  She was admitted to [facility] with confusion.  GI was consulted and did not find an organic cause for her not eating.  After discussing with the family, a feeding tube was recommended and she was transferred to [facility] for surgery consultation.  Subjective / 24h Interval events: She tells me that she feels hungry this morning.  Also tells me that food does not taste right, and felt this way ever since her surgery  Assesement and Plan: Principal Problem:   Hypernatremia Active Problems:   Acute metabolic encephalopathy   Essential hypertension   Gastro-esophageal reflux disease with esophagitis   Osteoarthrosis   OSA on CPAP   GAD (generalized anxiety disorder)   Mixed incontinence urge and stress   Morbid obesity (HCC)   Abdominal pain   Gout   Gastroesophageal reflux disease   Prediabetes   Memory difficulties   Hyperlipidemia   Severe dehydration   Altered mental status   Failure to thrive in adult   AKI (acute kidney injury) (HCC)   Gallbladder dilatation   Esophageal dysphagia   Abnormal esophagram   Hypoglycemia   Principal problem Hypernatremia / Severe Dehydration - pt presented with severe clinical findings of free water deficit.  Has received IV fluids and sodium is now normalized.  Monitor off fluids, sodium remaining stable today.   Active problems Hypoglycemia/Failure to  Thrive - due poor oral intake and recent gastric bypass.  Continue to monitor CBGs. Am cortisol 25.7. TSH 1.715. V78--4696. Folate 8.6 -Lower CBG was 61 yesterday around lunchtime, she was asymptomatic.  CBGs better since. -RD consulted, on calorie count currently   Adult Failure to Thrive/Esophageal dysmotility -Patient continues to have poor oral intake and no desire to eat. She continues to " pocket" her pills and food. Pt's sister traveled by train from [place] to come here to visit and help with arrangements for post hospital care -9/20 Esophagram--advanced esophageal dysmotility but no obstruction.  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  GI consulted, they do not appreciate this being an issue for which she could not eat.  After discussions with family, she was transferred to [facility] for surgical evaluation for gastrostomy tube.  General surgery following, for now hold off  UTI--ESBL E coli -9/20 UA>50 WBC. 9/20 urine culture = MDR Ecoli.  Has been placed on meropenem, today's day #4  Abdominal pain -9/14 CT abd--limited by motion, but  No obvious obstruction, free air or free fluid; s/p gastric bypass. 9/15 Abd US--GB distended, minimal GB sludge.  LFTs were normal.  She also underwent a HIDA scan which showed patent cystic and common bile ducts   NAGMA -improved after bicarb drip   Hypokalemia/Hypomagnesemia/Hypophosphatemia -replace and continue to monitor   AKI -baseline creatinine 0.6-1.0, -serum creatinine peaked 1.66, currently 1.2.  Continue to monitor   Acute metabolic encephalopathy/Failure to Thrive - secondary to  severe hypernatremia, AKI, mental status now back to baseline  OSA  - nightly CPAP ordered    Essential hypertension  -continue to monitor blood pressure   Morbid Obesity - pt is postop s/p 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 Dossie Der.   BMI 43  GERD - continue PPI   Sacral pressure wounds - noted below, appreciate wound care nursing team consultation   Goals of care -discussed with sister, brother, patient>>pt does not want gastrostomy tube -pt understands that there is nothing medically prohibitive affecting her swallowing and that she is able to eat. she acknowledges that she chooses not to chew and swallow -consult palliative medicine -numerous GOC discussions with pt and brother and sister who are patient's main advocate--not ready for full comfort -They are agreeable for gastrostomy tube placement for enteral feeding and plan DC to SNF for STR  Scheduled Meds:  (feeding supplement) PROSource Plus  30 mL Oral TID BM   antiseptic oral rinse  15 mL Mouth Rinse BID   calcium carbonate  200 mg of elemental calcium Oral TID   Chlorhexidine Gluconate Cloth  6 each Topical Daily   colchicine  0.6 mg Oral Daily   DULoxetine  60 mg Oral Daily   enoxaparin (LOVENOX) injection  40 mg Subcutaneous Q24H   feeding supplement (GLUCERNA SHAKE)  237 mL Oral TID BM   ferrous sulfate  325 mg Oral Q breakfast   folic acid  1 mg Intravenous Daily   Gerhardt's butt cream   Topical BID   megestrol  40 mg Oral Daily   multivitamin with minerals  1 tablet Oral BID   mouth rinse  15 mL Mouth Rinse TID   oxybutynin  15 mg Oral QHS   pantoprazole (PROTONIX) IV  40 mg Intravenous Q12H   sodium chloride flush  10-40 mL Intracatheter Q12H   thiamine (VITAMIN B1) injection  100 mg Intravenous Daily   Continuous Infusions:  meropenem (MERREM) IV 1 g (01/19/23 1833)   PRN Meds:.acetaminophen **OR** acetaminophen, diazepam, diclofenac Sodium, fentaNYL (SUBLIMAZE) injection, ondansetron **OR** ondansetron (ZOFRAN) IV, oxyCODONE, sodium chloride flush, traZODone  Current Outpatient Medications  Medication Instructions   acetaminophen (TYLENOL) 650 mg, Oral, Every 4 hours PRN   amLODipine (NORVASC) 10 mg, Oral, Daily   antiseptic oral rinse  (BIOTENE) LIQD 15 mLs, Mouth Rinse, 2 times daily   ascorbic acid (VITAMIN C) 500 mg, Oral, Daily   colchicine 1.2 mg, Oral, Daily   diclofenac Sodium (VOLTAREN) 2 g, Topical, 4 times daily   DULoxetine (CYMBALTA) 60 mg, Oral, Daily   ferrous sulfate 325 mg, Oral, Daily with breakfast   folic acid (FOLVITE) 1 mg, Oral, Daily   gabapentin (NEURONTIN) 100 mg, Oral, 3 times daily   Multiple Vitamin (MULTIVITAMIN WITH MINERALS) TABS tablet 1 tablet, Oral, Daily   Nutritional Supplements (PROMOD) LIQD 30 mLs, Oral, 2 times daily   omeprazole (PRILOSEC) 20 mg, Oral, Daily   oxybutynin (DITROPAN XL) 15 mg, Oral, Daily at bedtime   promethazine (PHENERGAN) 25 mg, Oral, Every 6 hours PRN   senna (SENOKOT) 8.6 MG TABS tablet 1 tablet, Oral, Daily at bedtime   sodium chloride irrigation 0.9 % irrigation 75 mLs, Irrigation, See admin instructions, Use 75 mL/hr intravenously every shift for fluid resuscitation for 2 days   traMADol (ULTRAM) 50 mg, Oral, 3 times daily PRN   triamterene-hydrochlorothiazide (MAXZIDE-25) 37.5-25 MG tablet 1 tablet, Oral, Daily   zinc gluconate 50 mg, Oral, Daily    Diet Orders (From  admission, onward)     Start     Ordered   01/19/23 1605  Diet regular Room service appropriate? Yes with Assist; Fluid consistency: Thin  Diet effective now       Question Answer Comment  Room service appropriate? Yes with Assist   Fluid consistency: Thin      01/19/23 1604            DVT prophylaxis: enoxaparin (LOVENOX) injection 40 mg Start: 01/08/23 2200   Lab Results  Component Value Date   PLT 311 01/20/2023      Code Status: Full Code  Family Communication: no family at bedside   Status is: Inpatient Remains inpatient appropriate because: severity of illness  Level of care: Med-Surg  Consultants:  Palliative care General surgery GI  Objective: Vitals:   01/19/23 1646 01/19/23 2257 01/20/23 0843 01/20/23 0916  BP: 128/82  (!) 148/83 114/82  Pulse: 93 93  86   Resp: 17 17 18 18   Temp: 98.2 F (36.8 C)  97.6 F (36.4 C) 97.8 F (36.6 C)  TempSrc:   Oral Oral  SpO2: 100% 100% 100% (!) 89%  Weight:      Height:        Intake/Output Summary (Last 24 hours) at 01/20/2023 0942 Last data filed at 01/20/2023 0916 Gross per 24 hour  Intake 320 ml  Output 900 ml  Net -580 ml   Wt Readings from Last 3 Encounters:  01/18/23 104.5 kg  11/14/22 114.6 kg  10/27/22 119.1 kg    Examination:  Constitutional: NAD Eyes: lids and conjunctivae normal, no scleral icterus ENMT: mmm Neck: normal, supple Respiratory: clear to auscultation bilaterally, no wheezing, no crackles.  Cardiovascular: Regular rate and rhythm, no murmurs / rubs / gallops.  Abdomen: soft, no distention, no tenderness. Bowel sounds positive.   Data Reviewed: I have independently reviewed following labs and imaging studies   CBC Recent Labs  Lab 01/16/23 0428 01/17/23 0452 01/18/23 0433 01/20/23 0211  WBC 10.8* 12.7* 8.2 5.5  HGB 9.9* 8.2* 8.3* 8.5*  HCT 32.6* 25.4* 25.7* 27.2*  PLT 262 233 280 311  MCV 85.6 84.1 81.8 83.7  MCH 26.0 27.2 26.4 26.2  MCHC 30.4 32.3 32.3 31.3  RDW 22.4* 22.0* 21.7* 21.3*    Recent Labs  Lab 01/15/23 0439 01/16/23 0428 01/16/23 0803 01/16/23 1008 01/17/23 0452 01/18/23 0433 01/20/23 0211  NA 141 140  --   --  138 140 139  K 3.8 3.4*  --   --  3.2* 3.3* 3.5  CL 116* 117*  --   --  104 111 110  CO2 16* 15*  --   --  28 20* 21*  GLUCOSE 88 106*  --   --  397* 76 86  BUN 14 13  --   --  18 20 12   CREATININE 0.85 0.99  --   --  1.11* 1.22* 1.11*  CALCIUM 8.7* 8.3*  --   --  7.1* 7.6* 7.7*  AST  --   --   --   --  13*  --  19  ALT  --   --   --   --  14  --  17  ALKPHOS  --   --   --   --  106  --  107  BILITOT  --   --   --   --  0.7  --  0.8  ALBUMIN  --   --   --   --  1.7*  --  1.9*  MG  --   --   --   --  1.2* 1.9 1.8  PROCALCITON  --   --  0.17  --   --   --   --   LATICACIDVEN  --   --  1.1 2.1*  --   --   --      ------------------------------------------------------------------------------------------------------------------ No results for input(s): "CHOL", "HDL", "LDLCALC", "TRIG", "CHOLHDL", "LDLDIRECT" in the last 72 hours.  Lab Results  Component Value Date   HGBA1C 6.3 (H) 09/01/2022   ------------------------------------------------------------------------------------------------------------------ No results for input(s): "TSH", "T4TOTAL", "T3FREE", "THYROIDAB" in the last 72 hours.  Invalid input(s): "FREET3"  Cardiac Enzymes No results for input(s): "CKMB", "TROPONINI", "MYOGLOBIN" in the last 168 hours.  Invalid input(s): "CK" ------------------------------------------------------------------------------------------------------------------ No results found for: "BNP"  CBG: Recent Labs  Lab 01/19/23 0653 01/19/23 0821 01/19/23 1218 01/19/23 1504 01/19/23 1645  GLUCAP 74 73 61* 81 73    Recent Results (from the past 240 hour(s))  Urine Culture     Status: Abnormal   Collection Time: 01/14/23  6:30 AM   Specimen: Urine, Random  Result Value Ref Range Status   Specimen Description   Final    URINE, RANDOM Performed at [facility], [address] [place], [place] [address]    Special Requests   Final    NONE Reflexed from [id number] Performed at [facility], [address], [place], [place] [address]    Culture (A)  Final    >=100,000 COLONIES/mL ESCHERICHIA COLI Confirmed Extended Spectrum Beta-Lactamase Producer (ESBL).  In bloodstream infections from ESBL organisms, carbapenems are preferred over piperacillin/tazobactam. They are shown to have a lower risk of mortality.    Report Status 01/16/2023 FINAL  Final   Organism ID, Bacteria ESCHERICHIA COLI (A)  Final      Susceptibility   Escherichia coli - MIC*    AMPICILLIN >=32 RESISTANT Resistant     CEFAZOLIN >=64 RESISTANT Resistant     CEFEPIME >=32 RESISTANT Resistant     CEFTRIAXONE >=64 RESISTANT Resistant      CIPROFLOXACIN >=4 RESISTANT Resistant     GENTAMICIN >=16 RESISTANT Resistant     IMIPENEM <=0.25 SENSITIVE Sensitive     NITROFURANTOIN <=16 SENSITIVE Sensitive     TRIMETH/SULFA >=320 RESISTANT Resistant     AMPICILLIN/SULBACTAM 16 INTERMEDIATE Intermediate     PIP/TAZO <=4 SENSITIVE Sensitive     * >=100,000 COLONIES/mL ESCHERICHIA COLI  Culture, blood (Routine X 2) w Reflex to ID Panel     Status: None (Preliminary result)   Collection Time: 01/16/23  8:03 AM   Specimen: Right Antecubital; Blood  Result Value Ref Range Status   Specimen Description   Final    RIGHT ANTECUBITAL BOTTLES DRAWN AEROBIC AND ANAEROBIC   Special Requests   Final    Blood Culture results may not be optimal due to an excessive volume of blood received in culture bottles   Culture   Final    NO GROWTH 4 DAYS Performed at [facility], [address], [place], [place] [address]    Report Status PENDING  Incomplete  Culture, blood (Routine X 2) w Reflex to ID Panel     Status: None (Preliminary result)   Collection Time: 01/16/23  8:03 AM   Specimen: BLOOD RIGHT HAND  Result Value Ref Range Status   Specimen Description   Final    BLOOD RIGHT HAND BOTTLES DRAWN AEROBIC AND ANAEROBIC   Special Requests   Final  Blood Culture results may not be optimal due to an excessive volume of blood received in culture bottles   Culture   Final    NO GROWTH 4 DAYS Performed at [facility], [address], [place], [place] [address]    Report Status PENDING  Incomplete     Radiology Studies: No results found.   Pamella Pert, MD, PhD [facility]  Between 7 am - 7 pm I am available, please contact me via Amion (for emergencies) or Securechat (non urgent messages)  Between 7 pm - 7 am I am not available, please contact night coverage MD/APP via Amion

## 2023-01-20 NOTE — Progress Notes (Signed)
RT placed patient on home CPAP.

## 2023-01-20 NOTE — Evaluation (Signed)
Occupational Therapy Evaluation Patient Details Name: Christine [name] MRN: [id number] DOB: 1950/08/06 Today's Date: 01/20/2023   History of Present Illness Christine [name] is a 72 y/o female with hypertension, hyperlipidemia, OSA on CPAP, GERD, anxiety disorder, anemia, morbid obesity 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 Dossie Der.  She has been progressively declining since surgery.  She has had multiple hospitalizations and ED visits in past several months.  Recently seen at [facility] on 01/03/23 with complaints of weakness and poor oral intake.  She was hospitalized in July and August of this year at different facilities for dehydration, weakness, acute renal failure and failure to thrive.  She has been residing in a SNF.  She apparently has not been eating or drinking and refusing to take her medications.  She was sent to ER today for evaluation of altered mentation.  She has had 3 days of progressive weakness and refusing oral intake.  She complains of pain all over body which is not new per record review. Pt is a poor historian.   She was found to be severely dehydrated with hypoglycemia and hypernatremic with a sodium of 157.  She also had acute kidney injury and distended gallbladder on CT scan.  CT head with no acute findings.  Pt was started on IV hydration and admission was requested.   Clinical Impression   Pt s/p above diagnosis. Pt from SNF, c/o minimal pain at rest, 6/10 FACES pain with toileting hygiene at sores on thighs/groin. Pt A/Ox1, not oriented to location, time, or situation. Pt agitated prior to entry per nursing when performing toileting hygiene, assisted with hygiene upon entering for eval. Pt mod A for rolling L/R, total A for toileting, Pt declined OOB or EOB activities due to pain. Pt would benefit greatly from postacute therapy <3hrs/day to improve functional independence/mobility, will be see acutely to improve as able.        If plan is discharge  home, recommend the following: A lot of help with walking and/or transfers;A lot of help with bathing/dressing/bathroom;Assistance with cooking/housework;Assist for transportation;Help with stairs or ramp for entrance    Functional Status Assessment  Patient has had a recent decline in their functional status and demonstrates the ability to make significant improvements in function in a reasonable and predictable amount of time.  Equipment Recommendations  Other (comment) (defer)    Recommendations for Other Services       Precautions / Restrictions Precautions Precautions: Fall Restrictions Weight Bearing Restrictions: No      Mobility Bed Mobility Overal bed mobility: Needs Assistance Bed Mobility: Rolling Rolling: Mod assist, Used rails         General bed mobility comments: mod A for rolling L/R using rails today for toileting hygiene in bed    Transfers                   General transfer comment: Pt declined      Balance Overall balance assessment: Needs assistance     Sitting balance - Comments: declined EOB       Standing balance comment: declined                           ADL either performed or assessed with clinical judgement   ADL Overall ADL's : Needs assistance/impaired Eating/Feeding: Independent   Grooming: Set up;Bed level   Upper Body Bathing: Moderate assistance   Lower Body Bathing: Total assistance;Bed level   Upper Body  Dressing : Moderate assistance   Lower Body Dressing: Total assistance;Bed level                 General ADL Comments: Pt total for LB ADLs at bed level today, too much pain to assist to EOB, mod A for rolling L/R to complete toileting     Vision Baseline Vision/History: 1 Wears glasses Ability to See in Adequate Light: 0 Adequate Patient Visual Report: No change from baseline       Perception         Praxis         Pertinent Vitals/Pain Pain Assessment Pain Assessment:  Faces Faces Pain Scale: Hurts even more Pain Location: abdomen, bed sores medial/posterior thighs/groin Pain Descriptors / Indicators: Sore, Grimacing, Discomfort, Moaning Pain Intervention(s): Monitored during session     Extremity/Trunk Assessment Upper Extremity Assessment Upper Extremity Assessment: Overall WFL for tasks assessed   Lower Extremity Assessment Lower Extremity Assessment: Defer to PT evaluation       Communication Communication Communication: No apparent difficulties   Cognition Arousal: Alert Behavior During Therapy: Flat affect, WFL for tasks assessed/performed Overall Cognitive Status: No family/caregiver present to determine baseline cognitive functioning                                 General Comments: A/Ox1 not oriented to location, time, or situation. per  nursing she was aggressive during toileting in bed.     General Comments       Exercises     Shoulder Instructions      Home Living Family/patient expects to be discharged to:: Skilled nursing facility Living Arrangements: Other (Comment) Available Help at Discharge: Neighbor;Available PRN/intermittently Type of Home: House Home Access: Stairs to enter [employer] of Steps: 4 Entrance Stairs-Rails: Can reach both;Right;Left Home Layout: One level     Bathroom Shower/Tub: Chief Strategy Officer: Standard Bathroom Accessibility: Yes   Home Equipment: Cane - single point   Additional Comments: Pt is considering moving back to [place] with family into retirement community      Prior Functioning/Environment Prior Level of Function : Independent/Modified Independent;Driving             Mobility Comments: Furniture walks within the house, uses [facility] for limited community mobility ADLs Comments: Mod I with ADL/IADLs, driving, and shopping        OT Problem List: Decreased strength;Decreased activity tolerance;Impaired balance (sitting and/or  standing);Decreased cognition;Decreased safety awareness;Decreased knowledge of use of DME or AE;Pain;Obesity      OT Treatment/Interventions: Self-care/ADL training;Therapeutic exercise;Energy conservation;DME and/or AE instruction;Therapeutic activities;Patient/family education    OT Goals(Current goals can be found in the care plan section) Acute Rehab OT Goals Patient Stated Goal: to improve with mobility/strength OT Goal Formulation: With patient Time For Goal Achievement: 02/03/23 Potential to Achieve Goals: Good  OT Frequency: Min 1X/week    Co-evaluation              AM-PAC OT "6 Clicks" Daily Activity     Outcome Measure Help from another person eating meals?: None Help from another person taking care of personal grooming?: A Little Help from another person toileting, which includes using toliet, bedpan, or urinal?: Total Help from another person bathing (including washing, rinsing, drying)?: A Lot Help from another person to put on and taking off regular upper body clothing?: A Lot Help from another person to put on and taking off regular  lower body clothing?: Total 6 Click Score: 13   End of Session Nurse Communication: Mobility status  Activity Tolerance: Patient limited by pain Patient left: in bed;with call bell/phone within reach;with nursing/sitter in room  OT Visit Diagnosis: Unsteadiness on feet (R26.81);Other abnormalities of gait and mobility (R26.89);Muscle weakness (generalized) (M62.81);Pain;Other symptoms and signs involving cognitive function Pain - part of body:  (abdomen, bed sores at thighs)                Time: 5366-4403 OT Time Calculation (min): 27 min Charges:  OT General Charges $OT Visit: 1 Visit OT Evaluation $OT Eval Moderate Complexity: 1 Mod OT Treatments $Self Care/Home Management : 8-22 mins  [place], OTR/L   Alexis Goodell 01/20/2023, 3:31 PM

## 2023-01-20 NOTE — Progress Notes (Signed)
Subjective: CC: Reports she is "starving" and would like to avoid a G-tube if possible.  Reports she at 1/4 tray of [place] cutlets, lima beans, pudding and OJ yesterday - verifying with RN. Did not drink any shakes yesterday.  Denies any abdominal pain yesterday or today. No n/v. Passing flatus. She reports last BM was several days ago - 9/22 per I/O  Objective: Vital signs in last 24 hours: Temp:  [97.6 F (36.4 C)-98.2 F (36.8 C)] 97.6 F (36.4 C) (09/26 0843) Pulse Rate:  [86-93] 86 (09/26 0843) Resp:  [17-18] 18 (09/26 0843) BP: (128-148)/(82-83) 148/83 (09/26 0843) SpO2:  [100 %] 100 % (09/26 0843) Last BM Date : 01/16/23  Intake/Output from previous day: 09/25 0701 - 09/26 0700 In: 320 [P.O.:120; IV Piggyback:200] Out: 400 [Urine:400] Intake/Output this shift: No intake/output data recorded.  PE: Gen:  Alert, NAD, pleasant Abd: Soft, ND, NT, +BS, incisions well healed  Lab Results:  Recent Labs    01/18/23 0433 01/20/23 0211  WBC 8.2 5.5  HGB 8.3* 8.5*  HCT 25.7* 27.2*  PLT 280 311   BMET Recent Labs    01/18/23 0433 01/20/23 0211  NA 140 139  K 3.3* 3.5  CL 111 110  CO2 20* 21*  GLUCOSE 76 86  BUN 20 12  CREATININE 1.22* 1.11*  CALCIUM 7.6* 7.7*   PT/INR No results for input(s): "LABPROT", "INR" in the last 72 hours. CMP     Component Value Date/Time   NA 139 01/20/2023 0211   K 3.5 01/20/2023 0211   CL 110 01/20/2023 0211   CO2 21 (L) 01/20/2023 0211   GLUCOSE 86 01/20/2023 0211   BUN 12 01/20/2023 0211   CREATININE 1.11 (H) 01/20/2023 0211   CREATININE 0.91 07/26/2017 1127   CALCIUM 7.7 (L) 01/20/2023 0211   PROT 5.1 (L) 01/20/2023 0211   ALBUMIN 1.9 (L) 01/20/2023 0211   AST 19 01/20/2023 0211   AST 15 07/26/2017 1127   ALT 17 01/20/2023 0211   ALT 14 07/26/2017 1127   ALKPHOS 107 01/20/2023 0211   BILITOT 0.8 01/20/2023 0211   BILITOT 0.5 07/26/2017 1127   GFRNONAA 54 (L) 01/20/2023 0211   GFRNONAA >60 07/26/2017 1127    GFRAA >60 02/28/2019 1022   GFRAA >60 07/26/2017 1127   Lipase     Component Value Date/Time   LIPASE 22 11/08/2022 2335    Studies/Results: No results found.  Anti-infectives: Anti-infectives (From admission, onward)    Start     Dose/Rate Route Frequency Ordered Stop   01/19/23 1700  meropenem (MERREM) 1 g in sodium chloride 0.9 % 100 mL IVPB        1 g 200 mL/hr over 30 Minutes Intravenous Every 12 hours 01/19/23 1345     01/17/23 1515  meropenem (MERREM) 1 g in sodium chloride 0.9 % 100 mL IVPB  Status:  Discontinued        1 g 200 mL/hr over 30 Minutes Intravenous Every 8 hours 01/17/23 1428 01/19/23 1345   01/16/23 1400  piperacillin-tazobactam (ZOSYN) IVPB 3.375 g  Status:  Discontinued        3.375 g 12.5 mL/hr over 240 Minutes Intravenous Every 8 hours 01/16/23 1322 01/17/23 1428   01/14/23 1000  cefTRIAXone (ROCEPHIN) 1 g in sodium chloride 0.9 % 100 mL IVPB  Status:  Discontinued        1 g 200 mL/hr over 30 Minutes Intravenous Every 24 hours 01/14/23 0925 01/16/23 1322  01/09/23 0930  cefTRIAXone (ROCEPHIN) 1 g in sodium chloride 0.9 % 100 mL IVPB        1 g 200 mL/hr over 30 Minutes Intravenous Every 24 hours 01/09/23 0840 01/11/23 1253      CT 9/14 - Distended gallbladder. If there is concern of gallbladder pathology ultrasound may be useful as the next step in the workup. - Grossly stable left-sided renal cystic foci. - Surgical changes of previous gastric bypass - Markedly limited examination due to the level of motion. No obvious obstruction, free air or free fluid. In addition, a repeat study could be considered when the patient is more clinically able.  RUQ [place] 9/15 1. Hepatic fatty infiltration. 2. Gallbladder distended. 3. Minimal gallbladder sludge. 4. Echogenic kidneys consistent with chronic medical renal disease. 5. Left kidney cyst. 6. There was limited visualization, as described.  Esophagram 9/20  1. Distal esophageal diverticulum  with possible associated mass causing extrinsic compression of the anterior distal esophageal wall. Further evaluation with endoscopy is recommended. 2. Significant delay in swallow initiation/mechanism. Prominent cricopharyngeal bar. 3. Advanced esophageal dysmotility with spasms. 4. Limited study due to patient's body habitus and mobility limitations.   CT chest 9/21 1. No acute cardiopulmonary process (no obvious extrinsic esophageal mass) 2. Minimal atelectasis in the left lower lobe.  HIDA 9/23 1.  Patent cystic and common bile ducts. 2. Elevated gallbladder ejection fraction as can be seen with gallbladder hyperkinesis.   Assessment/Plan Christine [name] is a 72 y.o. female with hx of robotic gastric bypass on 09/14/2022 by Dr. Dossie Der on 09/14/22 who is here with FTT - Please see Dr. Dossie Der assessment on consult note 9/25 - Patient has been evaluated by SLP and undergone multiple imaging studies as noted above without clear etiology for her poor PO intake. She has some esophageal dysmotility and possibly a esophageal diverticulum - Bariatric advanced diet and shakes as tolerated. Megace. Calorie Count.  - Vitamin B1/Thiamine levels wnl (71.2 on 9/22). Continue Multi and Thiamine. Monitor for vitamin deficiencies. RD following.  - Cont PPI. Avoid NSAIDs - Palliative following - Psych consult pending - Will continue to monitor while in the hospital to ensure she improves and does not require upper endoscopy, possible laparoscopic remnant gastrostomy tube placement, possible during this hospitalization.  - Appreciate TRH, psych, palliative, pharmacy, and RD assistance   FEN - Bariatric advanced. Glucerna shakes. IVF per TRH VTE - SCDs, Lovenox ID - Meropenem for E. Coli UTI.   I reviewed nursing notes,  hospitalist notes, last 24 h vitals and pain scores, last 48 h intake and output, last 24 h labs and trends, and last 24 h imaging results.   LOS: 12 days     Jacinto Halim , [facility] Surgery 01/20/2023, 8:59 AM Please see Amion for pager number during day hours 7:00am-4:30pm

## 2023-01-20 NOTE — Progress Notes (Signed)
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  Has had multiple hospitalizations and ED visits in the past several months.  Postoperative course complicated by very poor oral intake, weakness, currently residing in an SNF.  She has refused to eat, drink, and take her medications.  She was admitted to [facility] with confusion.  GI was consulted and did not find an organic cause for her not eating.  After discussing with the family, a feeding tube was recommended and she was transferred to [facility] for surgery consultation. Palliative care has been asked to continue involvement to support patient and further address goals of care as it relates to possible PEG placement,   Today's Discussion 01/20/2023  *Please note that this is a verbal dictation therefore any spelling or grammatical errors are due to the "Dragon Medical One" system interpretation.  Chart reviewed inclusive of vital signs, progress notes, laboratory results, and diagnostic images.   I met with Christine [name] at bedside this afternoon. Christine [name] tells me she is feeling well and eating more. At the bedside is her breakfast tray - appears she ate 50%. She has untouched apples sauce at bedside and declines when offered to her. She also has liquids - takes a few sips of water while I am present. She tells me she feels hungry and is ready to eat lunch. She feels she is doing well.   We discuss the feeding tube - she tells me she very much wants to avoid this and believes she is making progress. We discuss if she would ever consider this if things were worse in the future - she tells me she hopes another way could be figured out and feeding tube avoided.   We review her advance directives present in [place].   [name] is still in town - while I am  in the room she calls her and they talk for a while and I exited.   Objective Assessment: Vital Signs Vitals:   01/20/23 0843 01/20/23 0916  BP: (!) 148/83 114/82  Pulse: 86   Resp: 18 18  Temp: 97.6 F (36.4 C) 97.8 F (36.6 C)  SpO2: 100% (!) 89%    Intake/Output Summary (Last 24 hours) at 01/20/2023 1438 Last data filed at 01/20/2023 1610 Gross per 24 hour  Intake 320 ml  Output 900 ml  Net -580 ml   Last Weight  Most recent update: 01/18/2023  3:48 AM    Weight  104.5 kg (230 lb 6.1 oz)            Gen:  Elderly AA F in NAD CV: Regular rate and rhythm  PULM: On RA, breathing is even and nonlabored Neuro: Alert and oriented x3   SUMMARY OF RECOMMENDATIONS   Full code / full scope of care  Patient would like to eat and drink on her own, she is feeling greatly improved; hopeful to avoid PEG  Appreciate Nutrition consult and guidance  Appreciate PT/OT for mobility efforts  Nursing to provide oral care TID  Ongoing PMT support  Time Spent: 40 minutes  Christine [name] Andi Devon, DNP, [facility] Palliative Medicine Team Team Phone # [phone]  Pager # [phone]  Palliative Medicine Team providers are available by phone from 7am to 7pm daily and can be reached through the team cell phone.  Should this patient  require assistance outside of these hours, please call the patient's attending physician.

## 2023-01-21 ENCOUNTER — Encounter (HOSPITAL_COMMUNITY): Payer: Self-pay | Admitting: Family Medicine

## 2023-01-21 DIAGNOSIS — Z515 Encounter for palliative care: Secondary | ICD-10-CM | POA: Diagnosis not present

## 2023-01-21 DIAGNOSIS — E870 Hyperosmolality and hypernatremia: Secondary | ICD-10-CM | POA: Diagnosis not present

## 2023-01-21 DIAGNOSIS — Z7189 Other specified counseling: Secondary | ICD-10-CM | POA: Diagnosis not present

## 2023-01-21 DIAGNOSIS — R627 Adult failure to thrive: Secondary | ICD-10-CM | POA: Diagnosis not present

## 2023-01-21 LAB — COMPREHENSIVE METABOLIC PANEL
ALT: 17 U/L (ref 0–44)
AST: 14 U/L — ABNORMAL LOW (ref 15–41)
Albumin: 1.9 g/dL — ABNORMAL LOW (ref 3.5–5.0)
Alkaline Phosphatase: 95 U/L (ref 38–126)
Anion gap: 6 (ref 5–15)
BUN: 14 mg/dL (ref 8–23)
CO2: 20 mmol/L — ABNORMAL LOW (ref 22–32)
Calcium: 7.7 mg/dL — ABNORMAL LOW (ref 8.9–10.3)
Chloride: 112 mmol/L — ABNORMAL HIGH (ref 98–111)
Creatinine, Ser: 0.92 mg/dL (ref 0.44–1.00)
GFR, Estimated: >60 mL/min (ref >60)
Glucose, Bld: 80 mg/dL (ref 70–99)
Potassium: 3.4 mmol/L — ABNORMAL LOW (ref 3.5–5.1)
Sodium: 138 mmol/L (ref 135–145)
Total Bilirubin: 1 mg/dL (ref 0.3–1.2)
Total Protein: 4.9 g/dL — ABNORMAL LOW (ref 6.5–8.1)

## 2023-01-21 LAB — CBC
HCT: 27.7 % — ABNORMAL LOW (ref 36.0–46.0)
Hemoglobin: 8.5 g/dL — ABNORMAL LOW (ref 12.0–15.0)
MCH: 26 pg (ref 26.0–34.0)
MCHC: 30.7 g/dL (ref 30.0–36.0)
MCV: 84.7 fL (ref 80.0–100.0)
Platelets: 323 10*3/uL (ref 150–400)
RBC: 3.27 MIL/uL — ABNORMAL LOW (ref 3.87–5.11)
RDW: 21.2 % — ABNORMAL HIGH (ref 11.5–15.5)
WBC: 5.9 10*3/uL (ref 4.0–10.5)
nRBC: 0 % (ref 0.0–0.2)

## 2023-01-21 LAB — GLUCOSE, CAPILLARY
Glucose-Capillary: 78 mg/dL (ref 70–99)
Glucose-Capillary: 57 mg/dL — ABNORMAL LOW (ref 70–99)
Glucose-Capillary: 79 mg/dL (ref 70–99)
Glucose-Capillary: 100 mg/dL — ABNORMAL HIGH (ref 70–99)

## 2023-01-21 LAB — MAGNESIUM: Magnesium: 1.9 mg/dL (ref 1.7–2.4)

## 2023-01-21 LAB — PHOSPHORUS: Phosphorus: 3.4 mg/dL (ref 2.5–4.6)

## 2023-01-21 MED ORDER — POTASSIUM CHLORIDE CRYS ER 20 MEQ PO TBCR
40.0000 meq | EXTENDED_RELEASE_TABLET | Freq: Once | ORAL | Status: AC
  Administered 2023-01-21: 40 meq via ORAL
  Filled 2023-01-21: qty 2

## 2023-01-21 MED ORDER — DEXTROSE 50 % IV SOLN
12.5000 g | INTRAVENOUS | Status: AC
  Administered 2023-01-22: 12.5 g via INTRAVENOUS

## 2023-01-21 NOTE — Plan of Care (Signed)
Problem: Education: Goal: Ability to state signs and symptoms to report to health care provider will improve Outcome: Progressing Goal: Knowledge of the prescribed self-care regimen will improve Outcome: Progressing Goal: Knowledge of discharge needs will improve Outcome: Progressing   Problem: Activity: Goal: Ability to tolerate increased activity will improve Outcome: Progressing   Problem: Bowel/Gastric: Goal: Gastrointestinal status for postoperative course will improve Outcome: Progressing Goal: Occurrences of nausea will decrease Outcome: Progressing   Problem: Coping: Goal: Development of coping mechanisms to deal with changes in body function or appearance will improve Outcome: Progressing   Problem: Fluid Volume: Goal: Maintenance of adequate hydration will improve Outcome: Progressing   Problem: Nutritional: Goal: Nutritional status will improve Outcome: Progressing   Problem: Clinical Measurements: Goal: Will show no signs or symptoms of venous thromboembolism Outcome: Progressing Goal: Will remain free from infection Outcome: Progressing Goal: Will show no signs of GI Leak Outcome: Progressing   Problem: Respiratory: Goal: Will regain and/or maintain adequate ventilation Outcome: Progressing   Problem: Pain Management: Goal: Pain level will decrease Outcome: Progressing   Problem: Skin Integrity: Goal: Demonstration of wound healing without infection will improve Outcome: Progressing   Problem: Education: Goal: Knowledge of General Education information will improve Description Including pain rating scale, medication(s)/side effects and non-pharmacologic comfort measures Outcome: Progressing   Problem: Health Behavior/Discharge Planning: Goal: Ability to manage health-related needs will improve Outcome: Progressing   Problem: Clinical Measurements: Goal: Ability to maintain clinical measurements within normal limits will improve Outcome:  Progressing Goal: Will remain free from infection Outcome: Progressing Goal: Diagnostic test results will improve Outcome: Progressing Goal: Respiratory complications will improve Outcome: Progressing Goal: Cardiovascular complication will be avoided Outcome: Progressing   Problem: Activity: Goal: Risk for activity intolerance will decrease Outcome: Progressing   Problem: Nutrition: Goal: Adequate nutrition will be maintained Outcome: Progressing   Problem: Coping: Goal: Level of anxiety will decrease Outcome: Progressing   Problem: Elimination: Goal: Will not experience complications related to bowel motility Outcome: Progressing Goal: Will not experience complications related to urinary retention Outcome: Progressing   Problem: Pain Managment: Goal: General experience of comfort will improve Outcome: Progressing   Problem: Safety: Goal: Ability to remain free from injury will improve Outcome: Progressing   Problem: Skin Integrity: Goal: Risk for impaired skin integrity will decrease Outcome: Progressing

## 2023-01-21 NOTE — Plan of Care (Signed)
  Problem: Activity: Goal: Ability to tolerate increased activity will improve Outcome: Progressing   Problem: Fluid Volume: Goal: Maintenance of adequate hydration will improve Outcome: Progressing   Problem: Pain Management: Goal: Pain level will decrease Outcome: Progressing

## 2023-01-21 NOTE — Progress Notes (Signed)
   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  Has had multiple hospitalizations and ED visits in the past several months.  Postoperative course complicated by very poor oral intake, weakness, currently residing in an SNF.  She has refused to eat, drink, and take her medications.  She was admitted to [facility] with confusion.  GI was consulted and did not find an organic cause for her not eating.  After discussing with the family, a feeding tube was recommended and she was transferred to [facility] for surgery consultation. Palliative care has been asked to continue involvement to support patient and further address goals of care as it relates to possible PEG placement,   Today's Discussion 01/21/2023  *Please note that this is a verbal dictation therefore any spelling or grammatical errors are due to the "Dragon Medical One" system interpretation.  Chart reviewed inclusive of vital signs, progress notes, laboratory results, and diagnostic images.   Received message from dietician regarding concerns of not meeting nutritional needs. Patient's surgeon also in chat and hopeful to avoid PEG - focus on protein and hydration over the weekend.   I met with Christine [name] at bedside this afternoon. She tells me she feels well. She denies nausea. I tell her other staff reported some nausea earlier when she attempted to eat but she tells me it wasn't bad. She has an untouched lunch tray in front of her - she declines eating it now, tells me she will try later. Tells me she didn't eat breakfast. We discuss she has zofran ordered PRN that she has not received - discussed she can request this if she feels nauseous. She was grateful for this info. She continues to state she would not want a PEG.   She tells me her plan is to  discharge to her sister's home in [place].  Objective Assessment: Vital Signs Vitals:   01/21/23 0351 01/21/23 1031  BP: (!) 140/78 125/73  Pulse: 94 84  Resp: 18 18  Temp: 97.6 F (36.4 C) 97.7 F (36.5 C)  SpO2: 100% 97%    Intake/Output Summary (Last 24 hours) at 01/21/2023 1544 Last data filed at 01/21/2023 1610 Gross per 24 hour  Intake 300 ml  Output --  Net 300 ml   Last Weight  Most recent update: 01/21/2023  3:51 AM    Weight  104.6 kg (230 lb 9.6 oz)            Gen:  Elderly AA F in NAD CV: Regular rate and rhythm  PULM: On RA, breathing is even and nonlabored Neuro: Alert and oriented x3   SUMMARY OF RECOMMENDATIONS   Full code / full scope of care  Patient would like to eat and drink on her own, hopeful to avoid PEG  Appreciate Nutrition consult and guidance  Appreciate PT/OT for mobility efforts  Nursing to provide oral care TID  Ongoing PMT support  Time Spent: 25 minutes  Christine [name] Andi Devon, DNP, [facility] Palliative Medicine Team Team Phone # [phone]  Pager # [phone]  Palliative Medicine Team providers are available by phone from 7am to 7pm daily and can be reached through the team cell phone.  Should this patient require assistance outside of these hours, please call the patient's attending physician.

## 2023-01-21 NOTE — Progress Notes (Signed)
   01/21/23 2050  BiPAP/CPAP/SIPAP  $ Non-Invasive Home Ventilator  Subsequent  BiPAP/CPAP/SIPAP Pt Type Adult  FiO2 (%) 21 %  Patient Home Equipment Yes  Safety Check Completed by RT for Home Unit Yes, no issues noted   Pt stated she can put her home unit on herself. No issues noted with the machine. RT encouraged if patient needs help to notify us.

## 2023-01-21 NOTE — TOC Progression Note (Signed)
Transition of Care [facility]) - Progression Note    Patient Details  Name: Christine [name] MRN: [id number] Date of Birth: 16-Jun-1950  Transition of [facility] CM/SW Contact  Carley Hammed, LCSW Phone Number: 01/21/2023, 11:00 AM  Clinical Narrative:    TOC continues to follow pt for DC needs when medically cleared. Pt continues to have a bed at [facility] and will require a prior authorization. TOC is available as needed for discharge planning.    Expected Discharge Plan: Skilled Nursing Facility Barriers to Discharge: Continued Medical Work up  Expected Discharge Plan and Services In-house Referral: Clinical Social Work Discharge Planning Services: CM Consult Post Acute Care Choice: Skilled Nursing Facility Living arrangements for the past 2 months: Skilled Nursing Facility                                       Social Determinants of Health (SDOH) Interventions SDOH Screenings   Food Insecurity: No Food Insecurity (01/18/2023)  Housing: Patient Declined (01/18/2023)  Transportation Needs: No Transportation Needs (01/18/2023)  Utilities: Not At Risk (01/18/2023)  Alcohol Screen: Low Risk  (07/13/2022)  Depression (PHQ2-9): Low Risk  (01/21/2022)  Financial Resource Strain: Low Risk  (12/01/2022)   Received from [facility]  Physical Activity: Insufficiently Active (07/13/2022)  Social Connections: Moderately Integrated (12/01/2022)   Received from [facility]  Stress: No Stress Concern Present (07/13/2022)  Tobacco Use: Medium Risk (01/21/2023)  Health Literacy: Medium Risk (12/01/2022)   Received from [facility]    Readmission Risk Interventions    01/09/2023   11:11 AM  Readmission Risk Prevention Plan  Transportation Screening Complete  HRI or Home Care Consult Complete  Social Work Consult for Recovery Care Planning/Counseling Complete  Palliative Care Screening Not Applicable  Medication Review Oceanographer) Complete

## 2023-01-21 NOTE — Progress Notes (Signed)
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  Has had multiple hospitalizations and ED visits in the past several months.  Postoperative course complicated by very poor oral intake, weakness, currently residing in an SNF.  She has refused to eat, drink, and take her medications.  She was admitted to [facility] with confusion.  GI was consulted and did not find an organic cause for her not eating.  After discussing with the family, a feeding tube was recommended and she was transferred to [facility] for surgery consultation.  Subjective / 24h Interval events: Doing okay, no complaints.  No nausea or vomiting.  She is about to eat breakfast  Assesement and Plan: Principal Problem:   Hypernatremia Active Problems:   Acute metabolic encephalopathy   Essential hypertension   Gastro-esophageal reflux disease with esophagitis   Osteoarthrosis   OSA on CPAP   GAD (generalized anxiety disorder)   Mixed incontinence urge and stress   Morbid obesity (HCC)   Abdominal pain   Gout   Gastroesophageal reflux disease   Prediabetes   Memory difficulties   Hyperlipidemia   Severe dehydration   Altered mental status   Failure to thrive in adult   AKI (acute kidney injury) (HCC)   Gallbladder dilatation   Esophageal dysphagia   Abnormal esophagram   Hypoglycemia   Principal problem Hypernatremia / Severe Dehydration - pt presented with severe clinical findings of free water deficit.  Has received IV fluids and sodium is now normalized.  Monitor off fluids, sodium remaining stable   Active problems Hypoglycemia/Failure to Thrive - due poor oral intake and recent gastric bypass.  Continue  to monitor CBGs. Am cortisol 25.7. TSH 1.715. V78--4696. Folate 8.6 -Lower CBG was 61 yesterday around lunchtime, she was asymptomatic.  CBGs better since. -RD consulted, on calorie count currently -Still 1 more hypoglycemic episode yesterday as low as 64, asymptomatic   Adult Failure to Thrive/Esophageal dysmotility -Patient continues to have poor oral intake and no desire to eat. She continues to " pocket" her pills and food. Pt's sister traveled by train from [place] to come here to visit and help with arrangements for post hospital care -9/20 Esophagram--advanced esophageal dysmotility but no obstruction.  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  GI consulted, they do not appreciate this being an issue for which she could not eat.  After discussions with family, she was transferred to [facility] for surgical evaluation for gastrostomy tube.  General surgery following, for now hold off feeding tube  UTI--ESBL E coli -9/20 UA>50 WBC. 9/20 urine culture = MDR Ecoli.  Has been placed on meropenem, today's day #5  Abdominal pain -9/14 CT abd--limited by motion, but  No obvious obstruction, free air or free fluid; s/p gastric bypass. 9/15 Abd US--GB distended, minimal GB sludge.  LFTs were normal.  She also underwent a HIDA scan which showed patent cystic and common bile ducts   NAGMA -improved after bicarb drip   Hypokalemia/Hypomagnesemia/Hypophosphatemia -replace potassium again today   AKI -baseline creatinine 0.6-1.0, -serum creatinine peaked 1.66, currently 0.9 and has returned to her baseline   Acute metabolic encephalopathy/Failure to Thrive -  secondary to severe hypernatremia, AKI, mental status now back to baseline  OSA  - nightly CPAP ordered    Essential hypertension  -continue to monitor blood pressure   Morbid Obesity - pt is postop s/p robotic Roux-en-Y gastric bypass on  09/14/2022 by Dr. Dossie [name].  BMI 43  GERD - continue PPI   Sacral pressure wounds - noted below, appreciate wound care nursing team consultation   Goals of care -discussed with sister, brother, patient>>pt does not want gastrostomy tube -pt understands that there is nothing medically prohibitive affecting her swallowing and that she is able to eat. she acknowledges that she chooses not to chew and swallow -consult palliative medicine -numerous GOC discussions with pt and brother and sister who are patient's main advocate--not ready for full comfort -They are agreeable for gastrostomy tube placement for enteral feeding and plan DC to SNF for STR  Scheduled Meds:  antiseptic oral rinse  15 mL Mouth Rinse BID   calcium carbonate  200 mg of elemental calcium Oral TID   Chlorhexidine Gluconate Cloth  6 each Topical Daily   cholecalciferol  5,000 Units Oral Daily   colchicine  0.6 mg Oral Daily   DULoxetine  60 mg Oral Daily   enoxaparin (LOVENOX) injection  40 mg Subcutaneous Q24H   feeding supplement (GLUCERNA SHAKE)  237 mL Oral TID BM   ferrous sulfate  325 mg Oral Q breakfast   folic acid  1 mg Intravenous Daily   Gerhardt's butt cream   Topical BID   megestrol  400 mg Oral Daily   multivitamin with minerals  1 tablet Oral BID   mouth rinse  15 mL Mouth Rinse TID   oxybutynin  15 mg Oral QHS   pantoprazole (PROTONIX) IV  40 mg Intravenous Q12H   potassium chloride  40 mEq Oral Once   sodium chloride flush  10-40 mL Intracatheter Q12H   thiamine (VITAMIN B1) injection  100 mg Intravenous Daily   Continuous Infusions:  meropenem (MERREM) IV Stopped (01/21/23 0558)   PRN Meds:.acetaminophen **OR** acetaminophen, diazepam, diclofenac Sodium, fentaNYL (SUBLIMAZE) injection, ondansetron **OR** ondansetron (ZOFRAN) IV, oxyCODONE, sodium chloride flush, traZODone  Current Outpatient Medications  Medication Instructions   acetaminophen (TYLENOL) 650 mg, Oral, Every 4 hours PRN    amLODipine (NORVASC) 10 mg, Oral, Daily   antiseptic oral rinse (BIOTENE) LIQD 15 mLs, Mouth Rinse, 2 times daily   ascorbic acid (VITAMIN C) 500 mg, Oral, Daily   colchicine 1.2 mg, Oral, Daily   diclofenac Sodium (VOLTAREN) 2 g, Topical, 4 times daily   DULoxetine (CYMBALTA) 60 mg, Oral, Daily   ferrous sulfate 325 mg, Oral, Daily with breakfast   folic acid (FOLVITE) 1 mg, Oral, Daily   gabapentin (NEURONTIN) 100 mg, Oral, 3 times daily   Multiple Vitamin (MULTIVITAMIN WITH MINERALS) TABS tablet 1 tablet, Oral, Daily   Nutritional Supplements (PROMOD) LIQD 30 mLs, Oral, 2 times daily   omeprazole (PRILOSEC) 20 mg, Oral, Daily   oxybutynin (DITROPAN XL) 15 mg, Oral, Daily at bedtime   promethazine (PHENERGAN) 25 mg, Oral, Every 6 hours PRN   senna (SENOKOT) 8.6 MG TABS tablet 1 tablet, Oral, Daily at bedtime   sodium chloride irrigation 0.9 % irrigation 75 mLs, Irrigation, See admin instructions, Use 75 mL/hr intravenously every shift for fluid resuscitation for 2 days   traMADol (ULTRAM) 50 mg, Oral, 3 times daily PRN   triamterene-hydrochlorothiazide (MAXZIDE-25) 37.5-25 MG tablet 1 tablet, Oral, Daily   zinc gluconate 50 mg, Oral, Daily  Diet Orders (From admission, onward)     Start     Ordered   01/20/23 1500  Diet regular Room service appropriate? Yes with Assist; Fluid consistency: Thin  Diet effective now       Question Answer Comment  Room service appropriate? Yes with Assist   Fluid consistency: Thin      01/20/23 1459            DVT prophylaxis: enoxaparin (LOVENOX) injection 40 mg Start: 01/08/23 2200   Lab Results  Component Value Date   PLT 323 01/21/2023      Code Status: Full Code  Family Communication: no family at bedside   Status is: Inpatient Remains inpatient appropriate because: severity of illness  Level of care: Med-Surg  Consultants:  Palliative care General surgery GI  Objective: Vitals:   01/20/23 0843 01/20/23 0916 01/20/23  2028 01/21/23 0351  BP: (!) 148/83 114/82 135/87 (!) 140/78  Pulse: 86  (!) 101 94  Resp: 18 18 18 18   Temp: 97.6 F (36.4 C) 97.8 F (36.6 C) 97.8 F (36.6 C) 97.6 F (36.4 C)  TempSrc: Oral Oral Oral Oral  SpO2: 100% (!) 89% 97% 100%  Weight:    104.6 kg  Height:        Intake/Output Summary (Last 24 hours) at 01/21/2023 0906 Last data filed at 01/21/2023 0610 Gross per 24 hour  Intake 300 ml  Output 900 ml  Net -600 ml   Wt Readings from Last 3 Encounters:  01/21/23 104.6 kg  11/14/22 114.6 kg  10/27/22 119.1 kg    Examination:  Constitutional: NAD Eyes: lids and conjunctivae normal, no scleral icterus ENMT: mmm Neck: normal, supple Respiratory: clear to auscultation bilaterally, no wheezing, no crackles.  Cardiovascular: Regular rate and rhythm, no murmurs / rubs / gallops. No LE edema. Abdomen: soft, no distention, no tenderness. Bowel sounds positive.   Data Reviewed: I have independently reviewed following labs and imaging studies   CBC Recent Labs  Lab 01/16/23 0428 01/17/23 0452 01/18/23 0433 01/20/23 0211 01/21/23 0400  WBC 10.8* 12.7* 8.2 5.5 5.9  HGB 9.9* 8.2* 8.3* 8.5* 8.5*  HCT 32.6* 25.4* 25.7* 27.2* 27.7*  PLT 262 233 280 311 323  MCV 85.6 84.1 81.8 83.7 84.7  MCH 26.0 27.2 26.4 26.2 26.0  MCHC 30.4 32.3 32.3 31.3 30.7  RDW 22.4* 22.0* 21.7* 21.3* 21.2*    Recent Labs  Lab 01/16/23 0428 01/16/23 0803 01/16/23 1008 01/17/23 0452 01/18/23 0433 01/20/23 0211 01/21/23 0400  NA 140  --   --  138 140 139 138  K 3.4*  --   --  3.2* 3.3* 3.5 3.4*  CL 117*  --   --  104 111 110 112*  CO2 15*  --   --  28 20* 21* 20*  GLUCOSE 106*  --   --  397* 76 86 80  BUN 13  --   --  18 20 12 14   CREATININE 0.99  --   --  1.11* 1.22* 1.11* 0.92  CALCIUM 8.3*  --   --  7.1* 7.6* 7.7* 7.7*  AST  --   --   --  13*  --  19 14*  ALT  --   --   --  14  --  17 17  ALKPHOS  --   --   --  106  --  107 95  BILITOT  --   --   --  0.7  --  0.8 1.0  ALBUMIN   --   --   --  1.7*  --  1.9* 1.9*  MG  --   --   --  1.2* 1.9 1.8 1.9  PROCALCITON  --  0.17  --   --   --   --   --   LATICACIDVEN  --  1.1 2.1*  --   --   --   --     ------------------------------------------------------------------------------------------------------------------ No results for input(s): "CHOL", "HDL", "LDLCALC", "TRIG", "CHOLHDL", "LDLDIRECT" in the last 72 hours.  Lab Results  Component Value Date   HGBA1C 6.3 (H) 09/01/2022   ------------------------------------------------------------------------------------------------------------------ No results for input(s): "TSH", "T4TOTAL", "T3FREE", "THYROIDAB" in the last 72 hours.  Invalid input(s): "FREET3"  Cardiac Enzymes No results for input(s): "CKMB", "TROPONINI", "MYOGLOBIN" in the last 168 hours.  Invalid input(s): "CK" ------------------------------------------------------------------------------------------------------------------ No results found for: "BNP"  CBG: Recent Labs  Lab 01/19/23 1504 01/19/23 1645 01/20/23 1140 01/20/23 1637 01/20/23 1707  GLUCAP 81 73 69* 64* 94    Recent Results (from the past 240 hour(s))  Urine Culture     Status: Abnormal   Collection Time: 01/14/23  6:30 AM   Specimen: Urine, Random  Result Value Ref Range Status   Specimen Description   Final    URINE, RANDOM Performed at [facility], [address], [place], [place] [address]    Special Requests   Final    NONE Reflexed from [id number] Performed at [facility], [address] [place], [place] [address]    Culture (A)  Final    >=100,000 COLONIES/mL ESCHERICHIA COLI Confirmed Extended Spectrum Beta-Lactamase Producer (ESBL).  In bloodstream infections from ESBL organisms, carbapenems are preferred over piperacillin/tazobactam. They are shown to have a lower risk of mortality.    Report Status 01/16/2023 FINAL  Final   Organism ID, Bacteria ESCHERICHIA COLI (A)  Final      Susceptibility   Escherichia  coli - MIC*    AMPICILLIN >=32 RESISTANT Resistant     CEFAZOLIN >=64 RESISTANT Resistant     CEFEPIME >=32 RESISTANT Resistant     CEFTRIAXONE >=64 RESISTANT Resistant     CIPROFLOXACIN >=4 RESISTANT Resistant     GENTAMICIN >=16 RESISTANT Resistant     IMIPENEM <=0.25 SENSITIVE Sensitive     NITROFURANTOIN <=16 SENSITIVE Sensitive     TRIMETH/SULFA >=320 RESISTANT Resistant     AMPICILLIN/SULBACTAM 16 INTERMEDIATE Intermediate     PIP/TAZO <=4 SENSITIVE Sensitive     * >=100,000 COLONIES/mL ESCHERICHIA COLI  Culture, blood (Routine X 2) w Reflex to ID Panel     Status: None   Collection Time: 01/16/23  8:03 AM   Specimen: Right Antecubital; Blood  Result Value Ref Range Status   Specimen Description   Final    RIGHT ANTECUBITAL BOTTLES DRAWN AEROBIC AND ANAEROBIC   Special Requests   Final    Blood Culture results may not be optimal due to an excessive volume of blood received in culture bottles   Culture   Final    NO GROWTH 5 DAYS Performed at [facility], [address], [place], [place] [address]    Report Status 01/21/2023 FINAL  Final  Culture, blood (Routine X 2) w Reflex to ID Panel     Status: None   Collection Time: 01/16/23  8:03 AM   Specimen: BLOOD RIGHT HAND  Result Value Ref Range Status   Specimen Description   Final    BLOOD RIGHT HAND BOTTLES DRAWN AEROBIC  AND ANAEROBIC   Special Requests   Final    Blood Culture results may not be optimal due to an excessive volume of blood received in culture bottles   Culture   Final    NO GROWTH 5 DAYS Performed at [facility], [address], [place], [place] [address]    Report Status 01/21/2023 FINAL  Final     Radiology Studies: No results found.   Christine Pert, MD, PhD [facility]  Between 7 am - 7 pm I am available, please contact me via Amion (for emergencies) or Securechat (non urgent messages)  Between 7 pm - 7 am I am not available, please contact night coverage MD/APP via Amion

## 2023-01-21 NOTE — Progress Notes (Signed)
Calorie Count Note  48 hour calorie count ordered.  Pt sitting up in bed, trying to eat lunch at time of visit. Pt very nauseous and regurgitate once trying to eat an apple slice.   Case discussed MD, RN, palliative care and general surgery. Per palliative care, pt made it clear fairly clear that she does not want to pursue PEG. Reviewed calorie progress and nutritional targets as well as ordered interventions to help pt meet goals. RD will continue calorie count over the weekend to see if she is able to improve oral intake per MD request.   Diet: Regular Supplements: Glucerna Shakes TID, ProSource Plus BID, Carnation Instant Breakfast BID  9/26  Breakfast: n/a; diet not advanced until lunch Lunch: 163 kcal and 8g protein Dinner: 268 kcal and 11g protein Breakfast: 109 kcal and 5g protein Supplements: 220 kcal and 10g protein   Total intake: 760 kcal (45% of minimum estimated needs)  24g protein (24% of minimum estimated needs)  9/27 Breakfast: 0% Lunch: 0% Dinner: nothing documented Supplements: no supplements documented as consumed today  Total intake: 0 kcal (0% of minimum estimated needs)  0 protein (0% of minimum estimated needs)  Average Total intake:  380 kcal (22% of minimum estimated needs)  12 grams protein (12% of minimum estimated needs)  Nutrition Dx: Increased nutrient needs related to wound healing as evidenced by estimated needs; ongoing   Goal: Patient will meet greater than or equal to 90% of their needs; unmet   Intervention:   Continue 48 hour calorie count Recommend continuing regular diet with emphasis on protein Protein containing snacks TID between meals Glucerna Shake po TID, each supplement provides 220 kcal and 10 grams of protein Discontinue ProSource Plus Carnation Breakfast Essentials BID, each packet mixed with 8 ounces of 2% milk provides 13 grams of protein and 260 calories. Bariatric vitamin regimen: MVI BID and TUMS TID  Levada Schilling,  RD, LDN, CDCES Registered Dietitian II Certified Diabetes Care and Education Specialist Please refer to [facility] for RD and/or RD on-call/weekend/after hours pager

## 2023-01-21 NOTE — Progress Notes (Signed)
Subjective: Tolerating diet.  Not complaining of any abdominal pain.  Feels like she is thinking clearly at this point.  Objective: Vital signs in last 24 hours: Temp:  [97.6 F (36.4 C)-97.8 F (36.6 C)] 97.7 F (36.5 C) (09/27 1031) Pulse Rate:  [84-101] 84 (09/27 1031) Resp:  [18] 18 (09/27 1031) BP: (125-140)/(73-87) 125/73 (09/27 1031) SpO2:  [97 %-100 %] 97 % (09/27 1031) Weight:  [104.6 kg] 104.6 kg (09/27 0351) Last BM Date : 01/20/23  Intake/Output from previous day: 09/26 0701 - 09/27 0700 In: 300 [IV Piggyback:300] Out: 900 [Urine:900] Intake/Output this shift: No intake/output data recorded.  PE: Gen:  Alert, NAD, pleasant Abd: Soft, ND, NT, +BS, incisions well healed  Lab Results:  Recent Labs    01/20/23 0211 01/21/23 0400  WBC 5.5 5.9  HGB 8.5* 8.5*  HCT 27.2* 27.7*  PLT 311 323   BMET Recent Labs    01/20/23 0211 01/21/23 0400  NA 139 138  K 3.5 3.4*  CL 110 112*  CO2 21* 20*  GLUCOSE 86 80  BUN 12 14  CREATININE 1.11* 0.92  CALCIUM 7.7* 7.7*   PT/INR No results for input(s): "LABPROT", "INR" in the last 72 hours. CMP     Component Value Date/Time   NA 138 01/21/2023 0400   K 3.4 (L) 01/21/2023 0400   CL 112 (H) 01/21/2023 0400   CO2 20 (L) 01/21/2023 0400   GLUCOSE 80 01/21/2023 0400   BUN 14 01/21/2023 0400   CREATININE 0.92 01/21/2023 0400   CREATININE 0.91 07/26/2017 1127   CALCIUM 7.7 (L) 01/21/2023 0400   PROT 4.9 (L) 01/21/2023 0400   ALBUMIN 1.9 (L) 01/21/2023 0400   AST 14 (L) 01/21/2023 0400   AST 15 07/26/2017 1127   ALT 17 01/21/2023 0400   ALT 14 07/26/2017 1127   ALKPHOS 95 01/21/2023 0400   BILITOT 1.0 01/21/2023 0400   BILITOT 0.5 07/26/2017 1127   GFRNONAA >60 01/21/2023 0400   GFRNONAA >60 07/26/2017 1127   GFRAA >60 02/28/2019 1022   GFRAA >60 07/26/2017 1127   Lipase     Component Value Date/Time   LIPASE 22 11/08/2022 2335    Studies/Results: No results  found.  Anti-infectives: Anti-infectives (From admission, onward)    Start     Dose/Rate Route Frequency Ordered Stop   01/20/23 1430  meropenem (MERREM) 1 g in sodium chloride 0.9 % 100 mL IVPB        1 g 200 mL/hr over 30 Minutes Intravenous Every 8 hours 01/20/23 1338     01/19/23 1700  meropenem (MERREM) 1 g in sodium chloride 0.9 % 100 mL IVPB  Status:  Discontinued        1 g 200 mL/hr over 30 Minutes Intravenous Every 12 hours 01/19/23 1345 01/20/23 1338   01/17/23 1515  meropenem (MERREM) 1 g in sodium chloride 0.9 % 100 mL IVPB  Status:  Discontinued        1 g 200 mL/hr over 30 Minutes Intravenous Every 8 hours 01/17/23 1428 01/19/23 1345   01/16/23 1400  piperacillin-tazobactam (ZOSYN) IVPB 3.375 g  Status:  Discontinued        3.375 g 12.5 mL/hr over 240 Minutes Intravenous Every 8 hours 01/16/23 1322 01/17/23 1428   01/14/23 1000  cefTRIAXone (ROCEPHIN) 1 g in sodium chloride 0.9 % 100 mL IVPB  Status:  Discontinued        1 g 200 mL/hr over 30 Minutes  Intravenous Every 24 hours 01/14/23 0925 01/16/23 1322   01/09/23 0930  cefTRIAXone (ROCEPHIN) 1 g in sodium chloride 0.9 % 100 mL IVPB        1 g 200 mL/hr over 30 Minutes Intravenous Every 24 hours 01/09/23 0840 01/11/23 1253      CT 9/14 - Distended gallbladder. If there is concern of gallbladder pathology ultrasound may be useful as the next step in the workup. - Grossly stable left-sided renal cystic foci. - Surgical changes of previous gastric bypass - Markedly limited examination due to the level of motion. No obvious obstruction, free air or free fluid. In addition, a repeat study could be considered when the patient is more clinically able.  RUQ [place] 9/15 1. Hepatic fatty infiltration. 2. Gallbladder distended. 3. Minimal gallbladder sludge. 4. Echogenic kidneys consistent with chronic medical renal disease. 5. Left kidney cyst. 6. There was limited visualization, as described.  Esophagram 9/20  1.  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 Further evaluation with endoscopy is recommended. 2. Significant delay in swallow initiation/mechanism. Prominent cricopharyngeal bar. 3. Advanced esophageal dysmotility with spasms. 4. Limited study due to patient's body habitus and mobility limitations.   CT chest 9/21 1. No acute cardiopulmonary process (no obvious extrinsic esophageal mass) 2. Minimal atelectasis in the left lower lobe.  HIDA 9/23 1.  Patent cystic and common bile ducts. 2. Elevated gallbladder ejection fraction as can be seen with gallbladder hyperkinesis.   Assessment/Plan Christine [name] is a 72 y.o. female with hx of robotic gastric bypass on 09/14/2022 by Dr. Dossie Der on 09/14/22 who is here with FTT - Unsure etiology of mood and mentation issues postoperatively, may have been due to thiamine deficiency, change in antidepressant absorption after gastric bypass, UTI?  Whatever the cause, I think this was also the cause of her inability to eat and now it seems to be resolving - Resume postoperative bariatric diet.  I suspect caloric and protein intake needs would be much lower than the recent dietician note considering she is 4 months s/p gastric bypass.  Caloric intake likely between 1000-1500 kCal/day and protein between 65 and 100 g/day may be more reasonable and I will discuss with inpatient and outpatient dietician teams. - Would benefit from protein shakes - Encourage compliance with bariatric diet - avoid high sugar loads as these may cause dumping syndrome - Monitor intake over the weekend - Continue to replete vitamins, minierals  - If she does well over the weekend, maybe we would be able to discharge on Monday with outpatient follow up - At this time, no plans for surgery or procedures - My partners are in the hospital if needed this weekend, and I will be back on Monday  to check on the patient.  I reviewed nursing notes,  hospitalist notes, last 24 h vitals and pain scores, last 48 h intake and output, last 24 h labs and trends, and last 24 h imaging results.   LOS: 13 days   Quentin Ore, MD [facility] Surgery 01/21/2023, 2:23 PM Please see Amion for pager number during day hours 7:00am-4:30pm

## 2023-01-22 DIAGNOSIS — E870 Hyperosmolality and hypernatremia: Secondary | ICD-10-CM | POA: Diagnosis not present

## 2023-01-22 DIAGNOSIS — Z7189 Other specified counseling: Secondary | ICD-10-CM | POA: Diagnosis not present

## 2023-01-22 DIAGNOSIS — Z515 Encounter for palliative care: Secondary | ICD-10-CM | POA: Diagnosis not present

## 2023-01-22 DIAGNOSIS — R627 Adult failure to thrive: Secondary | ICD-10-CM | POA: Diagnosis not present

## 2023-01-22 LAB — GLUCOSE, CAPILLARY
Glucose-Capillary: 46 mg/dL — ABNORMAL LOW (ref 70–99)
Glucose-Capillary: 50 mg/dL — ABNORMAL LOW (ref 70–99)
Glucose-Capillary: 59 mg/dL — ABNORMAL LOW (ref 70–99)
Glucose-Capillary: 106 mg/dL — ABNORMAL HIGH (ref 70–99)
Glucose-Capillary: 75 mg/dL (ref 70–99)
Glucose-Capillary: 72 mg/dL (ref 70–99)
Glucose-Capillary: 61 mg/dL — ABNORMAL LOW (ref 70–99)
Glucose-Capillary: 65 mg/dL — ABNORMAL LOW (ref 70–99)
Glucose-Capillary: 86 mg/dL (ref 70–99)
Glucose-Capillary: 42 mg/dL — CL (ref 70–99)
Glucose-Capillary: 85 mg/dL (ref 70–99)

## 2023-01-22 LAB — CREATININE, SERUM
Creatinine, Ser: 1.01 mg/dL — ABNORMAL HIGH (ref 0.44–1.00)
GFR, Estimated: >60 mL/min (ref >60)

## 2023-01-22 MED ORDER — KCL-LACTATED RINGERS-D5W 20 MEQ/L IV SOLN
INTRAVENOUS | Status: DC
  Filled 2023-01-22 (×2): qty 1000

## 2023-01-22 MED ORDER — DEXTROSE 50 % IV SOLN: 25.0000 g | INTRAVENOUS | Status: AC

## 2023-01-22 MED ORDER — ENSURE ENLIVE PO LIQD: 237.0000 mL | Freq: Two times a day (BID) | ORAL | Status: DC

## 2023-01-22 MED ORDER — DEXTROSE 50 % IV SOLN
INTRAVENOUS | Status: AC
  Administered 2023-01-22: 25 g via INTRAVENOUS
  Filled 2023-01-22: qty 50

## 2023-01-22 MED ORDER — DEXTROSE 50 % IV SOLN
INTRAVENOUS | Status: AC
  Filled 2023-01-22: qty 50

## 2023-01-22 NOTE — Progress Notes (Signed)
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  Has had multiple hospitalizations and ED visits in the past several months.  Postoperative course complicated by very poor oral intake, weakness, currently residing in an SNF.  She has refused to eat, drink, and take her medications.  She was admitted to [facility] with confusion.  GI was consulted and did not find an organic cause for her not eating.  After discussing with the family, a feeding tube was recommended and she was transferred to [facility] for surgery consultation.  Subjective / 24h Interval events: Eating breakfast.  She tells me she is eating but very slow.  Assesement and Plan: Principal Problem:   Hypernatremia Active Problems:   Acute metabolic encephalopathy   Essential hypertension   Gastro-esophageal reflux disease with esophagitis   Osteoarthrosis   OSA on CPAP   GAD (generalized anxiety disorder)   Mixed incontinence urge and stress   Morbid obesity (HCC)   Abdominal pain   Gout   Gastroesophageal reflux disease   Prediabetes   Memory difficulties   Hyperlipidemia   Severe dehydration   Altered mental status   Failure to thrive in adult   AKI (acute kidney injury) (HCC)   Gallbladder dilatation   Esophageal dysphagia   Abnormal esophagram   Hypoglycemia   Principal problem Hypernatremia / Severe Dehydration - pt presented with severe clinical findings of free water deficit.  Has received IV fluids and sodium is now normalized.  Monitor off fluids, sodium has remained stable   Active problems Hypoglycemia/Failure to Thrive - due poor oral intake and recent gastric bypass.  Continue to monitor CBGs.  Am cortisol 25.7. TSH 1.715. V95--6387. Folate 8.6 -Lower CBG was 61 yesterday around lunchtime, she was asymptomatic.  CBGs better since. -RD consulted, on calorie count currently -Has been having recurrent hypoglycemia, down to 46 yesterday morning.  CBGs today slightly better  CBG (last 3)  Recent Labs    01/22/23 0713 01/22/23 0744 01/22/23 0816  GLUCAP 59* 106* 75    Adult Failure to Thrive/Esophageal dysmotility -Patient continues to have poor oral intake and no desire to eat. She continues to " pocket" her pills and food. Pt's sister traveled by train from [place] to come here to visit and help with arrangements for post hospital care -9/20 Esophagram--advanced esophageal dysmotility but no obstruction.  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  GI consulted, they do not appreciate this being an issue for which she could not eat.  After discussions with family, she was transferred to [facility] for surgical evaluation for gastrostomy tube.  General surgery following, for now hold off feeding tube  UTI--ESBL E coli -9/20 UA>50 WBC. 9/20 urine culture = MDR Ecoli.  Has been placed on meropenem, today's day #6, plan for 7 days total  Abdominal pain -9/14 CT abd--limited by motion, but  No obvious obstruction, free air or free fluid; s/p gastric bypass. 9/15 Abd US--GB distended, minimal GB sludge.  LFTs were normal.  She also underwent a HIDA scan which showed patent cystic and common bile ducts   NAGMA -improved after bicarb drip   Hypokalemia/Hypomagnesemia/Hypophosphatemia -due to  monitor and replace as indicated   AKI -baseline creatinine 0.6-1.0, -serum creatinine peaked 1.66, and is currently at baseline   Acute metabolic encephalopathy/Failure to Thrive - secondary to severe hypernatremia, AKI, mental status now back to baseline  OSA  - nightly CPAP ordered    Essential  hypertension  -continue to monitor blood pressure   Morbid Obesity - pt is postop s/p 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 Dossie Der.  BMI 43  GERD - continue PPI   Sacral pressure wounds - noted below, appreciate wound care nursing team consultation   Goals of care -discussed with sister, brother, patient>>pt does not want gastrostomy tube -pt understands that there is nothing medically prohibitive affecting her swallowing and that she is able to eat. she acknowledges that she chooses not to chew and swallow -consult palliative medicine -numerous GOC discussions with pt and brother and sister who are patient's main advocate--not ready for full comfort -Hold gastrostomy tube placement for now, monitor calories  Scheduled Meds:  antiseptic oral rinse  15 mL Mouth Rinse BID   calcium carbonate  200 mg of elemental calcium Oral TID   Chlorhexidine Gluconate Cloth  6 each Topical Daily   cholecalciferol  5,000 Units Oral Daily   colchicine  0.6 mg Oral Daily   dextrose       DULoxetine  60 mg Oral Daily   enoxaparin (LOVENOX) injection  40 mg Subcutaneous Q24H   feeding supplement (GLUCERNA SHAKE)  237 mL Oral TID BM   ferrous sulfate  325 mg Oral Q breakfast   folic acid  1 mg Intravenous Daily   Gerhardt's butt cream   Topical BID   megestrol  400 mg Oral Daily   multivitamin with minerals  1 tablet Oral BID   mouth rinse  15 mL Mouth Rinse TID   oxybutynin  15 mg Oral QHS   pantoprazole (PROTONIX) IV  40 mg Intravenous Q12H   sodium chloride flush  10-40 mL Intracatheter Q12H   thiamine (VITAMIN B1) injection  100 mg Intravenous Daily   Continuous Infusions:  meropenem (MERREM) IV 1 g (01/22/23 0528)   PRN Meds:.acetaminophen **OR** acetaminophen, dextrose, diazepam, diclofenac Sodium, fentaNYL (SUBLIMAZE) injection, ondansetron **OR** ondansetron (ZOFRAN) IV, oxyCODONE, sodium chloride flush, traZODone  Current Outpatient Medications  Medication  Instructions   acetaminophen (TYLENOL) 650 mg, Oral, Every 4 hours PRN   amLODipine (NORVASC) 10 mg, Oral, Daily   antiseptic oral rinse (BIOTENE) LIQD 15 mLs, Mouth Rinse, 2 times daily   ascorbic acid (VITAMIN C) 500 mg, Oral, Daily   colchicine 1.2 mg, Oral, Daily   diclofenac Sodium (VOLTAREN) 2 g, Topical, 4 times daily   DULoxetine (CYMBALTA) 60 mg, Oral, Daily   ferrous sulfate 325 mg, Oral, Daily with breakfast   folic acid (FOLVITE) 1 mg, Oral, Daily   gabapentin (NEURONTIN) 100 mg, Oral, 3 times daily   Multiple Vitamin (MULTIVITAMIN WITH MINERALS) TABS tablet 1 tablet, Oral, Daily   Nutritional Supplements (PROMOD) LIQD 30 mLs, Oral, 2 times daily   omeprazole (PRILOSEC) 20 mg, Oral, Daily   oxybutynin (DITROPAN XL) 15 mg, Oral, Daily at bedtime   promethazine (PHENERGAN) 25 mg, Oral, Every 6 hours PRN   senna (SENOKOT) 8.6 MG TABS tablet 1 tablet, Oral, Daily at bedtime   sodium chloride irrigation 0.9 % irrigation 75 mLs, Irrigation, See admin instructions, Use 75 mL/hr intravenously every shift for fluid resuscitation for 2 days   traMADol (ULTRAM) 50 mg, Oral, 3 times daily PRN  triamterene-hydrochlorothiazide (MAXZIDE-25) 37.5-25 MG tablet 1 tablet, Oral, Daily   zinc gluconate 50 mg, Oral, Daily    Diet Orders (From admission, onward)     Start     Ordered   01/20/23 1500  Diet regular Room service appropriate? Yes with Assist; Fluid consistency: Thin  Diet effective now       Question Answer Comment  Room service appropriate? Yes with Assist   Fluid consistency: Thin      01/20/23 1459            DVT prophylaxis: enoxaparin (LOVENOX) injection 40 mg Start: 01/08/23 2200   Lab Results  Component Value Date   PLT 323 01/21/2023      Code Status: Full Code  Family Communication: no family at bedside   Status is: Inpatient Remains inpatient appropriate because: severity of illness  Level of care: Med-Surg  Consultants:  Palliative care General  surgery GI  Objective: Vitals:   01/21/23 1605 01/21/23 1952 01/22/23 0247 01/22/23 0815  BP: 124/80 109/63 122/71 138/74  Pulse: 84 93 75 97  Resp: 17 18 18 17   Temp: 98.2 F (36.8 C) 98.5 F (36.9 C) 97.8 F (36.6 C) (!) 97.5 F (36.4 C)  TempSrc: Oral Oral Oral   SpO2: 100% 100% 100% 100%  Weight:      Height:        Intake/Output Summary (Last 24 hours) at 01/22/2023 1024 Last data filed at 01/22/2023 0845 Gross per 24 hour  Intake 410 ml  Output 100 ml  Net 310 ml   Wt Readings from Last 3 Encounters:  01/21/23 104.6 kg  11/14/22 114.6 kg  10/27/22 119.1 kg    Examination:  Constitutional: NAD Eyes: lids and conjunctivae normal, no scleral icterus ENMT: mmm Neck: normal, supple Respiratory: clear to auscultation bilaterally, no wheezing, no crackles. Normal respiratory effort.  Cardiovascular: Regular rate and rhythm, no murmurs / rubs / gallops. No LE edema. Abdomen: soft, no distention, no tenderness. Bowel sounds positive.   Data Reviewed: I have independently reviewed following labs and imaging studies   CBC Recent Labs  Lab 01/16/23 0428 01/17/23 0452 01/18/23 0433 01/20/23 0211 01/21/23 0400  WBC 10.8* 12.7* 8.2 5.5 5.9  HGB 9.9* 8.2* 8.3* 8.5* 8.5*  HCT 32.6* 25.4* 25.7* 27.2* 27.7*  PLT 262 233 280 311 323  MCV 85.6 84.1 81.8 83.7 84.7  MCH 26.0 27.2 26.4 26.2 26.0  MCHC 30.4 32.3 32.3 31.3 30.7  RDW 22.4* 22.0* 21.7* 21.3* 21.2*    Recent Labs  Lab 01/16/23 0428 01/16/23 0803 01/16/23 1008 01/17/23 0452 01/18/23 0433 01/20/23 0211 01/21/23 0400 01/22/23 0449  NA 140  --   --  138 140 139 138  --   K 3.4*  --   --  3.2* 3.3* 3.5 3.4*  --   CL 117*  --   --  104 111 110 112*  --   CO2 15*  --   --  28 20* 21* 20*  --   GLUCOSE 106*  --   --  397* 76 86 80  --   BUN 13  --   --  18 20 12 14   --   CREATININE 0.99  --   --  1.11* 1.22* 1.11* 0.92 1.01*  CALCIUM 8.3*  --   --  7.1* 7.6* 7.7* 7.7*  --   AST  --   --   --  13*  --   19 14*  --   ALT  --   --   --  14  --  17 17  --   ALKPHOS  --   --   --  106  --  107 95  --   BILITOT  --   --   --  0.7  --  0.8 1.0  --   ALBUMIN  --   --   --  1.7*  --  1.9* 1.9*  --   MG  --   --   --  1.2* 1.9 1.8 1.9  --   PROCALCITON  --  0.17  --   --   --   --   --   --   LATICACIDVEN  --  1.1 2.1*  --   --   --   --   --     ------------------------------------------------------------------------------------------------------------------ No results for input(s): "CHOL", "HDL", "LDLCALC", "TRIG", "CHOLHDL", "LDLDIRECT" in the last 72 hours.  Lab Results  Component Value Date   HGBA1C 6.3 (H) 09/01/2022   ------------------------------------------------------------------------------------------------------------------ No results for input(s): "TSH", "T4TOTAL", "T3FREE", "THYROIDAB" in the last 72 hours.  Invalid input(s): "FREET3"  Cardiac Enzymes No results for input(s): "CKMB", "TROPONINI", "MYOGLOBIN" in the last 168 hours.  Invalid input(s): "CK" ------------------------------------------------------------------------------------------------------------------ No results found for: "BNP"  CBG: Recent Labs  Lab 01/22/23 0638 01/22/23 0702 01/22/23 0713 01/22/23 0744 01/22/23 0816  GLUCAP 50* 46* 59* 106* 75    Recent Results (from the past 240 hour(s))  Urine Culture     Status: Abnormal   Collection Time: 01/14/23  6:30 AM   Specimen: Urine, Random  Result Value Ref Range Status   Specimen Description   Final    URINE, RANDOM Performed at [facility], [address], [place], [place] [address]    Special Requests   Final    NONE Reflexed from [id number] Performed at [facility], [address], [place], [place] [address]    Culture (A)  Final    >=100,000 COLONIES/mL ESCHERICHIA COLI Confirmed Extended Spectrum Beta-Lactamase Producer (ESBL).  In bloodstream infections from ESBL organisms, carbapenems are preferred over piperacillin/tazobactam. They  are shown to have a lower risk of mortality.    Report Status 01/16/2023 FINAL  Final   Organism ID, Bacteria ESCHERICHIA COLI (A)  Final      Susceptibility   Escherichia coli - MIC*    AMPICILLIN >=32 RESISTANT Resistant     CEFAZOLIN >=64 RESISTANT Resistant     CEFEPIME >=32 RESISTANT Resistant     CEFTRIAXONE >=64 RESISTANT Resistant     CIPROFLOXACIN >=4 RESISTANT Resistant     GENTAMICIN >=16 RESISTANT Resistant     IMIPENEM <=0.25 SENSITIVE Sensitive     NITROFURANTOIN <=16 SENSITIVE Sensitive     TRIMETH/SULFA >=320 RESISTANT Resistant     AMPICILLIN/SULBACTAM 16 INTERMEDIATE Intermediate     PIP/TAZO <=4 SENSITIVE Sensitive     * >=100,000 COLONIES/mL ESCHERICHIA COLI  Culture, blood (Routine X 2) w Reflex to ID Panel     Status: None   Collection Time: 01/16/23  8:03 AM   Specimen: Right Antecubital; Blood  Result Value Ref Range Status   Specimen Description   Final    RIGHT ANTECUBITAL BOTTLES DRAWN AEROBIC AND ANAEROBIC   Special Requests   Final    Blood Culture results may not be optimal due to an excessive volume of blood received in culture bottles   Culture   Final    NO GROWTH 5 DAYS Performed at [facility], [address] [place], [place] [address]    Report  Status 01/21/2023 FINAL  Final  Culture, blood (Routine X 2) w Reflex to ID Panel     Status: None   Collection Time: 01/16/23  8:03 AM   Specimen: BLOOD RIGHT HAND  Result Value Ref Range Status   Specimen Description   Final    BLOOD RIGHT HAND BOTTLES DRAWN AEROBIC AND ANAEROBIC   Special Requests   Final    Blood Culture results may not be optimal due to an excessive volume of blood received in culture bottles   Culture   Final    NO GROWTH 5 DAYS Performed at [facility], [address] [place], [place] [address]    Report Status 01/21/2023 FINAL  Final     Radiology Studies: No results found.   Pamella Pert, MD, PhD [facility]  Between 7 am - 7 pm I am available, please  contact me via Amion (for emergencies) or Securechat (non urgent messages)  Between 7 pm - 7 am I am not available, please contact night coverage MD/APP via Amion

## 2023-01-22 NOTE — Progress Notes (Signed)
CROSS COVER NOTE  NAME: Charnee Nelis MRN: [id number] DOB : 05/04/1950    Concern as stated by nurse / staff   Messge received from bedside nursePatient blood glucose is 42 need order for dextrose 50 she is not eating or drinking much of anything and she is alert and oriented. Did get some apple juice in her also she has NS going at [facility] can we get some D5 for her she does not eat and has been having low blood sugars daily "    Pertinent findings on chart review:    Latest Ref Rng & Units 01/22/2023    4:49 AM 01/21/2023    4:00 AM 01/20/2023    2:11 AM  BMP  Glucose 70 - 99 mg/dL  80  86   BUN 8 - 23 mg/dL  14  12   Creatinine 4.40 - 1.00 mg/dL 1.02  7.25  3.66   Sodium 135 - 145 mmol/L  138  139   Potassium 3.5 - 5.1 mmol/L  3.4  3.5   Chloride 98 - 111 mmol/L  112  110   CO2 22 - 32 mmol/L  20  21   Calcium 8.9 - 10.3 mg/dL  7.7  7.7    CBG (last 3)  Recent Labs    01/22/23 1659 01/22/23 1902 01/22/23 2047  GLUCAP 65* 86 42*   No active IV fluid orders AKI  Assessment and  Interventions   Assessment:  Plan: Hypoglycemia standing orders Start D5NS with 20 KCL at 100 ml/h       Donnie Mesa NP [facility] Cross Cover 7pm-7am - check amion for availability Pager [phone]

## 2023-01-22 NOTE — Progress Notes (Signed)
Provider notified that pt is not eating or drinking very little if any and Pt has not voided this shift. Bladder scan was 464 ml. Patient is taking oxybutynin 15 mg at night. Pt sister is HCPOA and pt. said if sister said we can cath her she would. Pt is refusing sister called at 0323 am and did speak with patient and has decided to wait until after patient wakes up in the morning and if she has not voided cath her at that time. Provider made aware and will report to oncoming shift nurse.

## 2023-01-22 NOTE — Progress Notes (Signed)
   01/22/23 2134  BiPAP/CPAP/SIPAP  $ Non-Invasive Home Ventilator  Subsequent  BiPAP/CPAP/SIPAP Pt Type Adult  BiPAP/CPAP/SIPAP Resmed  Mask Type Nasal pillows  Mask Size Small  IPAP 15 cmH20  EPAP 5 cmH2O  FiO2 (%) 21 %  Patient Home Equipment Yes  CPAP/SIPAP surface wiped down Yes  Safety Check Completed by RT for Home Unit Yes, no issues noted

## 2023-01-22 NOTE — Progress Notes (Signed)
Date and time results received: 01/22/23 0638   Test: cbg Critical Value: 50  Name of Provider Notified:will do  Orders Received? Or Actions Taken?:  yes a cup of juice , rechecked at 0702, CBG was down again to 46 then to 59 at 0713, nurse  gave 12.5g of D50, will check in 15 minutes time and update day shift nurse.

## 2023-01-22 NOTE — Plan of Care (Signed)
Problem: Education: Goal: Ability to state signs and symptoms to report to health care provider will improve Outcome: Progressing Goal: Knowledge of the prescribed self-care regimen will improve Outcome: Progressing Goal: Knowledge of discharge needs will improve Outcome: Progressing   Problem: Activity: Goal: Ability to tolerate increased activity will improve Outcome: Progressing   Problem: Bowel/Gastric: Goal: Gastrointestinal status for postoperative course will improve Outcome: Progressing Goal: Occurrences of nausea will decrease Outcome: Progressing   Problem: Coping: Goal: Development of coping mechanisms to deal with changes in body function or appearance will improve Outcome: Progressing   Problem: Fluid Volume: Goal: Maintenance of adequate hydration will improve Outcome: Progressing   Problem: Nutritional: Goal: Nutritional status will improve Outcome: Progressing   Problem: Clinical Measurements: Goal: Will show no signs or symptoms of venous thromboembolism Outcome: Progressing Goal: Will remain free from infection Outcome: Progressing Goal: Will show no signs of GI Leak Outcome: Progressing   Problem: Respiratory: Goal: Will regain and/or maintain adequate ventilation Outcome: Progressing   Problem: Pain Management: Goal: Pain level will decrease Outcome: Progressing   Problem: Skin Integrity: Goal: Demonstration of wound healing without infection will improve Outcome: Progressing   Problem: Education: Goal: Knowledge of General Education information will improve Description Including pain rating scale, medication(s)/side effects and non-pharmacologic comfort measures Outcome: Progressing   Problem: Health Behavior/Discharge Planning: Goal: Ability to manage health-related needs will improve Outcome: Progressing   Problem: Clinical Measurements: Goal: Ability to maintain clinical measurements within normal limits will improve Outcome:  Progressing Goal: Will remain free from infection Outcome: Progressing Goal: Diagnostic test results will improve Outcome: Progressing Goal: Respiratory complications will improve Outcome: Progressing Goal: Cardiovascular complication will be avoided Outcome: Progressing   Problem: Activity: Goal: Risk for activity intolerance will decrease Outcome: Progressing   Problem: Nutrition: Goal: Adequate nutrition will be maintained Outcome: Progressing   Problem: Coping: Goal: Level of anxiety will decrease Outcome: Progressing   Problem: Elimination: Goal: Will not experience complications related to bowel motility Outcome: Progressing Goal: Will not experience complications related to urinary retention Outcome: Progressing   Problem: Pain Managment: Goal: General experience of comfort will improve Outcome: Progressing   Problem: Safety: Goal: Ability to remain free from injury will improve Outcome: Progressing   Problem: Skin Integrity: Goal: Risk for impaired skin integrity will decrease Outcome: Progressing

## 2023-01-22 NOTE — Plan of Care (Signed)
  Problem: Activity: Goal: Ability to tolerate increased activity will improve Outcome: Progressing   Problem: Bowel/Gastric: Goal: Occurrences of nausea will decrease Outcome: Progressing   Problem: Nutritional: Goal: Nutritional status will improve Outcome: Progressing   Problem: Clinical Measurements: Goal: Will remain free from infection Outcome: Progressing   Problem: Respiratory: Goal: Will regain and/or maintain adequate ventilation Outcome: Progressing   Problem: Pain Management: Goal: Pain level will decrease Outcome: Progressing

## 2023-01-23 DIAGNOSIS — Z515 Encounter for palliative care: Secondary | ICD-10-CM | POA: Diagnosis not present

## 2023-01-23 DIAGNOSIS — Z66 Do not resuscitate: Secondary | ICD-10-CM | POA: Diagnosis not present

## 2023-01-23 DIAGNOSIS — Z7189 Other specified counseling: Secondary | ICD-10-CM | POA: Diagnosis not present

## 2023-01-23 DIAGNOSIS — R627 Adult failure to thrive: Secondary | ICD-10-CM | POA: Diagnosis not present

## 2023-01-23 DIAGNOSIS — E870 Hyperosmolality and hypernatremia: Secondary | ICD-10-CM | POA: Diagnosis not present

## 2023-01-23 LAB — BASIC METABOLIC PANEL
Anion gap: 4 — ABNORMAL LOW (ref 5–15)
BUN: 13 mg/dL (ref 8–23)
CO2: 20 mmol/L — ABNORMAL LOW (ref 22–32)
Calcium: 8 mg/dL — ABNORMAL LOW (ref 8.9–10.3)
Chloride: 114 mmol/L — ABNORMAL HIGH (ref 98–111)
Creatinine, Ser: 0.83 mg/dL (ref 0.44–1.00)
GFR, Estimated: >60 mL/min (ref >60)
Glucose, Bld: 80 mg/dL (ref 70–99)
Potassium: 3.6 mmol/L (ref 3.5–5.1)
Sodium: 138 mmol/L (ref 135–145)

## 2023-01-23 LAB — PHOSPHORUS: Phosphorus: 2.7 mg/dL (ref 2.5–4.6)

## 2023-01-23 LAB — CBC
HCT: 26.7 % — ABNORMAL LOW (ref 36.0–46.0)
Hemoglobin: 8.6 g/dL — ABNORMAL LOW (ref 12.0–15.0)
MCH: 27.6 pg (ref 26.0–34.0)
MCHC: 32.2 g/dL (ref 30.0–36.0)
MCV: 85.6 fL (ref 80.0–100.0)
Platelets: 310 10*3/uL (ref 150–400)
RBC: 3.12 MIL/uL — ABNORMAL LOW (ref 3.87–5.11)
RDW: 21.2 % — ABNORMAL HIGH (ref 11.5–15.5)
WBC: 5.5 10*3/uL (ref 4.0–10.5)
nRBC: 0 % (ref 0.0–0.2)

## 2023-01-23 LAB — GLUCOSE, CAPILLARY
Glucose-Capillary: 162 mg/dL — ABNORMAL HIGH (ref 70–99)
Glucose-Capillary: 32 mg/dL — CL (ref 70–99)
Glucose-Capillary: 69 mg/dL — ABNORMAL LOW (ref 70–99)
Glucose-Capillary: 56 mg/dL — ABNORMAL LOW (ref 70–99)
Glucose-Capillary: 67 mg/dL — ABNORMAL LOW (ref 70–99)
Glucose-Capillary: 55 mg/dL — ABNORMAL LOW (ref 70–99)
Glucose-Capillary: 56 mg/dL — ABNORMAL LOW (ref 70–99)
Glucose-Capillary: 110 mg/dL — ABNORMAL HIGH (ref 70–99)

## 2023-01-23 LAB — MAGNESIUM: Magnesium: 1.7 mg/dL (ref 1.7–2.4)

## 2023-01-23 MED ORDER — POTASSIUM CHLORIDE CRYS ER 20 MEQ PO TBCR
40.0000 meq | EXTENDED_RELEASE_TABLET | Freq: Once | ORAL | Status: AC
  Administered 2023-01-23: 40 meq via ORAL
  Filled 2023-01-23: qty 2

## 2023-01-23 MED ORDER — DEXTROSE 50 % IV SOLN
12.5000 g | INTRAVENOUS | Status: AC
  Administered 2023-01-23: 12.5 g via INTRAVENOUS
  Filled 2023-01-23: qty 50

## 2023-01-23 MED ORDER — MAGNESIUM SULFATE 2 GM/50ML IV SOLN
2.0000 g | Freq: Once | INTRAVENOUS | Status: AC
  Administered 2023-01-23: 2 g via INTRAVENOUS
  Filled 2023-01-23: qty 50

## 2023-01-23 NOTE — Plan of Care (Signed)
Problem: Education: Goal: Ability to state signs and symptoms to report to health care provider will improve Outcome: Progressing Goal: Knowledge of the prescribed self-care regimen will improve Outcome: Progressing Goal: Knowledge of discharge needs will improve Outcome: Progressing   Problem: Activity: Goal: Ability to tolerate increased activity will improve Outcome: Progressing   Problem: Bowel/Gastric: Goal: Gastrointestinal status for postoperative course will improve Outcome: Progressing Goal: Occurrences of nausea will decrease Outcome: Progressing   Problem: Coping: Goal: Development of coping mechanisms to deal with changes in body function or appearance will improve Outcome: Progressing   Problem: Fluid Volume: Goal: Maintenance of adequate hydration will improve Outcome: Progressing   Problem: Nutritional: Goal: Nutritional status will improve Outcome: Progressing   Problem: Clinical Measurements: Goal: Will show no signs or symptoms of venous thromboembolism Outcome: Progressing Goal: Will remain free from infection Outcome: Progressing Goal: Will show no signs of GI Leak Outcome: Progressing   Problem: Respiratory: Goal: Will regain and/or maintain adequate ventilation Outcome: Progressing   Problem: Pain Management: Goal: Pain level will decrease Outcome: Progressing   Problem: Skin Integrity: Goal: Demonstration of wound healing without infection will improve Outcome: Progressing   Problem: Education: Goal: Knowledge of General Education information will improve Description Including pain rating scale, medication(s)/side effects and non-pharmacologic comfort measures Outcome: Progressing   Problem: Health Behavior/Discharge Planning: Goal: Ability to manage health-related needs will improve Outcome: Progressing   Problem: Clinical Measurements: Goal: Ability to maintain clinical measurements within normal limits will improve Outcome:  Progressing Goal: Will remain free from infection Outcome: Progressing Goal: Diagnostic test results will improve Outcome: Progressing Goal: Respiratory complications will improve Outcome: Progressing Goal: Cardiovascular complication will be avoided Outcome: Progressing   Problem: Activity: Goal: Risk for activity intolerance will decrease Outcome: Progressing   Problem: Nutrition: Goal: Adequate nutrition will be maintained Outcome: Progressing   Problem: Coping: Goal: Level of anxiety will decrease Outcome: Progressing   Problem: Elimination: Goal: Will not experience complications related to bowel motility Outcome: Progressing Goal: Will not experience complications related to urinary retention Outcome: Progressing   Problem: Pain Managment: Goal: General experience of comfort will improve Outcome: Progressing   Problem: Safety: Goal: Ability to remain free from injury will improve Outcome: Progressing   Problem: Skin Integrity: Goal: Risk for impaired skin integrity will decrease Outcome: Progressing

## 2023-01-23 NOTE — Progress Notes (Signed)
Patient cbg critical value 42 at 2047. Provider Manuela Schwartz NP notified. Patient administered 25g of D50 blood glucose recheck and was 85. Monitoring of patient continues.

## 2023-01-23 NOTE — Progress Notes (Signed)
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  Has had multiple hospitalizations and ED visits in the past several months.  Postoperative course complicated by very poor oral intake, weakness, currently residing in an SNF.  She has refused to eat, drink, and take her medications.  She was admitted to [facility] with confusion.  GI was consulted and did not find an organic cause for her not eating.  After discussing with the family, a feeding tube was recommended and she was transferred to [facility] for surgery consultation.  Subjective / 24h Interval events: Reports that she is eating, but this is not confirmed by the nursing staff.  Had another hypoglycemic episode last night with CBGs in the 40s.  She knows she is in the hospital but does not know the year.  Has very flat affect  Assesement and Plan: Principal Problem:   Hypernatremia Active Problems:   Acute metabolic encephalopathy   Essential hypertension   Gastro-esophageal reflux disease with esophagitis   Osteoarthrosis   OSA on CPAP   GAD (generalized anxiety disorder)   Mixed incontinence urge and stress   Morbid obesity (HCC)   Abdominal pain   Gout   Gastroesophageal reflux disease   Prediabetes   Memory difficulties   Hyperlipidemia   Severe dehydration   Altered mental status   Failure to thrive in adult   AKI (acute kidney injury) (HCC)   Gallbladder dilatation   Esophageal dysphagia   Abnormal esophagram   Hypoglycemia   Principal problem Hypernatremia / Severe Dehydration - pt presented with severe clinical findings of free water deficit.  Has received IV fluids and sodium is now normalized.  Continue to  monitor off fluids   Active problems Hypoglycemia/Failure to Thrive - due poor oral intake and recent gastric bypass.  Continue to monitor CBGs. Am cortisol 25.7. TSH 1.715. W11--9147. Folate 8.6 -Has recurrent hypoglycemia, last night CBG was in the 40s -RD consulted, on calorie count currently  CBG (last 3)  Recent Labs    01/22/23 2047 01/22/23 2243 01/23/23 0650  GLUCAP 42* 85 162*    Adult Failure to Thrive/Esophageal dysmotility -Patient continues to have poor oral intake and no desire to eat. She continues to " pocket" her pills and food. Pt's sister traveled by train from [place] to come here to visit and help with arrangements for post hospital care -9/20 Esophagram--advanced esophageal dysmotility but no obstruction.  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  GI consulted, they do not appreciate this being an issue for which she could not eat.  After discussions with family, she was transferred to [facility] for surgical evaluation for gastrostomy tube.  General surgery following, for now hold off feeding tube  UTI--ESBL E coli -9/20 UA>50 WBC. 9/20 urine culture = MDR Ecoli.  Has been placed on meropenem, today's day #7, plan for 7 days total  Abdominal pain -9/14 CT abd--limited by motion, but  No obvious obstruction, free air or free fluid; s/p gastric bypass. 9/15 Abd US--GB distended, minimal GB sludge.  LFTs were normal.  She also underwent a HIDA scan which showed patent cystic and common bile ducts  NAGMA -improved after bicarb drip   Hypokalemia/Hypomagnesemia/Hypophosphatemia -due to monitor and replace as indicated   AKI -baseline creatinine 0.6-1.0, -serum creatinine peaked 1.66, and is currently at baseline   Acute metabolic encephalopathy/Failure to Thrive - secondary to severe hypernatremia, AKI, mental status now back to baseline  OSA  - nightly CPAP  ordered    Essential hypertension  -continue to monitor blood pressure   Morbid Obesity - pt is postop s/p 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 Dossie Der.  BMI 43  GERD - continue PPI   Sacral pressure wounds - noted below, appreciate wound care nursing team consultation   Goals of care -discussed with sister, brother, patient>>pt does not want gastrostomy tube -pt understands that there is nothing medically prohibitive affecting her swallowing and that she is able to eat. she acknowledges that she chooses not to chew and swallow -consult palliative medicine -numerous GOC discussions with pt and brother and sister who are patient's main advocate--not ready for full comfort -Hold gastrostomy tube placement for now, monitor calories  Scheduled Meds:  antiseptic oral rinse  15 mL Mouth Rinse BID   calcium carbonate  200 mg of elemental calcium Oral TID   Chlorhexidine Gluconate Cloth  6 each Topical Daily   cholecalciferol  5,000 Units Oral Daily   colchicine  0.6 mg Oral Daily   DULoxetine  60 mg Oral Daily   enoxaparin (LOVENOX) injection  40 mg Subcutaneous Q24H   feeding supplement  237 mL Oral BID BM   feeding supplement (GLUCERNA SHAKE)  237 mL Oral TID BM   ferrous sulfate  325 mg Oral Q breakfast   folic acid  1 mg Intravenous Daily   Gerhardt's butt cream   Topical BID   megestrol  400 mg Oral Daily   multivitamin with minerals  1 tablet Oral BID   mouth rinse  15 mL Mouth Rinse TID   oxybutynin  15 mg Oral QHS   pantoprazole (PROTONIX) IV  40 mg Intravenous Q12H   sodium chloride flush  10-40 mL Intracatheter Q12H   thiamine (VITAMIN B1) injection  100 mg Intravenous Daily   Continuous Infusions:  meropenem (MERREM) IV 1 g (01/23/23 0551)   PRN Meds:.acetaminophen **OR** acetaminophen, diazepam, diclofenac Sodium, fentaNYL (SUBLIMAZE) injection, ondansetron **OR** ondansetron (ZOFRAN) IV, oxyCODONE, sodium chloride flush, traZODone  Current  Outpatient Medications  Medication Instructions   acetaminophen (TYLENOL) 650 mg, Oral, Every 4 hours PRN   amLODipine (NORVASC) 10 mg, Oral, Daily   antiseptic oral rinse (BIOTENE) LIQD 15 mLs, Mouth Rinse, 2 times daily   ascorbic acid (VITAMIN C) 500 mg, Oral, Daily   colchicine 1.2 mg, Oral, Daily   diclofenac Sodium (VOLTAREN) 2 g, Topical, 4 times daily   DULoxetine (CYMBALTA) 60 mg, Oral, Daily   ferrous sulfate 325 mg, Oral, Daily with breakfast   folic acid (FOLVITE) 1 mg, Oral, Daily   gabapentin (NEURONTIN) 100 mg, Oral, 3 times daily   Multiple Vitamin (MULTIVITAMIN WITH MINERALS) TABS tablet 1 tablet, Oral, Daily   Nutritional Supplements (PROMOD) LIQD 30 mLs, Oral, 2 times daily   omeprazole (PRILOSEC) 20 mg, Oral, Daily   oxybutynin (DITROPAN XL) 15 mg, Oral, Daily at bedtime   promethazine (PHENERGAN) 25 mg, Oral, Every 6 hours PRN   senna (SENOKOT) 8.6 MG TABS tablet 1 tablet, Oral, Daily at bedtime   sodium chloride irrigation 0.9 % irrigation 75 mLs, Irrigation, See admin instructions, Use 75 mL/hr intravenously every shift for fluid resuscitation for 2  days   traMADol (ULTRAM) 50 mg, Oral, 3 times daily PRN   triamterene-hydrochlorothiazide (MAXZIDE-25) 37.5-25 MG tablet 1 tablet, Oral, Daily   zinc gluconate 50 mg, Oral, Daily    Diet Orders (From admission, onward)     Start     Ordered   01/20/23 1500  Diet regular Room service appropriate? Yes with Assist; Fluid consistency: Thin  Diet effective now       Question Answer Comment  Room service appropriate? Yes with Assist   Fluid consistency: Thin      01/20/23 1459            DVT prophylaxis: enoxaparin (LOVENOX) injection 40 mg Start: 01/08/23 2200   Lab Results  Component Value Date   PLT 310 01/23/2023      Code Status: Full Code  Family Communication: no family at bedside   Status is: Inpatient Remains inpatient appropriate because: severity of illness  Level of care:  Med-Surg  Consultants:  Palliative care General surgery GI  Objective: Vitals:   01/22/23 1938 01/23/23 0500 01/23/23 0517 01/23/23 0856  BP: 137/81  (!) 144/69 135/83  Pulse: 97  100 76  Resp: 18  18 16   Temp: 98.2 F (36.8 C)  98.4 F (36.9 C) 98.6 F (37 C)  TempSrc: Oral  Oral Oral  SpO2: 100%  97% 100%  Weight:  107.3 kg    Height:        Intake/Output Summary (Last 24 hours) at 01/23/2023 0924 Last data filed at 01/23/2023 9562 Gross per 24 hour  Intake 610 ml  Output 600 ml  Net 10 ml   Wt Readings from Last 3 Encounters:  01/23/23 107.3 kg  11/14/22 114.6 kg  10/27/22 119.1 kg    Examination:  Constitutional: NAD Eyes: lids and conjunctivae normal, no scleral icterus ENMT: mmm Neck: normal, supple Respiratory: clear to auscultation bilaterally, no wheezing, no crackles. Normal respiratory effort.  Cardiovascular: Regular rate and rhythm, no murmurs / rubs / gallops. No LE edema. Abdomen: soft, no distention, no tenderness. Bowel sounds positive.   Data Reviewed: I have independently reviewed following labs and imaging studies   CBC Recent Labs  Lab 01/17/23 0452 01/18/23 0433 01/20/23 0211 01/21/23 0400 01/23/23 0402  WBC 12.7* 8.2 5.5 5.9 5.5  HGB 8.2* 8.3* 8.5* 8.5* 8.6*  HCT 25.4* 25.7* 27.2* 27.7* 26.7*  PLT 233 280 311 323 310  MCV 84.1 81.8 83.7 84.7 85.6  MCH 27.2 26.4 26.2 26.0 27.6  MCHC 32.3 32.3 31.3 30.7 32.2  RDW 22.0* 21.7* 21.3* 21.2* 21.2*    Recent Labs  Lab 01/16/23 1008 01/17/23 0452 01/17/23 0452 01/18/23 0433 01/20/23 0211 01/21/23 0400 01/22/23 0449 01/23/23 0402  NA  --  138  --  140 139 138  --  138  K  --  3.2*  --  3.3* 3.5 3.4*  --  3.6  CL  --  104  --  111 110 112*  --  114*  CO2  --  28  --  20* 21* 20*  --  20*  GLUCOSE  --  397*  --  76 86 80  --  80  BUN  --  18  --  20 12 14   --  13  CREATININE  --  1.11*   < > 1.22* 1.11* 0.92 1.01* 0.83  CALCIUM  --  7.1*  --  7.6* 7.7* 7.7*  --  8.0*  AST   --  13*  --   --  19 14*  --   --   ALT  --  14  --   --  17 17  --   --   ALKPHOS  --  106  --   --  107 95  --   --   BILITOT  --  0.7  --   --  0.8 1.0  --   --   ALBUMIN  --  1.7*  --   --  1.9* 1.9*  --   --   MG  --  1.2*  --  1.9 1.8 1.9  --  1.7  LATICACIDVEN 2.1*  --   --   --   --   --   --   --    < > = values in this interval not displayed.    ------------------------------------------------------------------------------------------------------------------ No results for input(s): "CHOL", "HDL", "LDLCALC", "TRIG", "CHOLHDL", "LDLDIRECT" in the last 72 hours.  Lab Results  Component Value Date   HGBA1C 6.3 (H) 09/01/2022   ------------------------------------------------------------------------------------------------------------------ No results for input(s): "TSH", "T4TOTAL", "T3FREE", "THYROIDAB" in the last 72 hours.  Invalid input(s): "FREET3"  Cardiac Enzymes No results for input(s): "CKMB", "TROPONINI", "MYOGLOBIN" in the last 168 hours.  Invalid input(s): "CK" ------------------------------------------------------------------------------------------------------------------ No results found for: "BNP"  CBG: Recent Labs  Lab 01/22/23 1659 01/22/23 1902 01/22/23 2047 01/22/23 2243 01/23/23 0650  GLUCAP 65* 86 42* 85 162*    Recent Results (from the past 240 hour(s))  Urine Culture     Status: Abnormal   Collection Time: 01/14/23  6:30 AM   Specimen: Urine, Random  Result Value Ref Range Status   Specimen Description   Final    URINE, RANDOM Performed at [facility], [address], [place], [place] [address]    Special Requests   Final    NONE Reflexed from [id number] Performed at [facility], [address] [place], [place] [address]    Culture (A)  Final    >=100,000 COLONIES/mL ESCHERICHIA COLI Confirmed Extended Spectrum Beta-Lactamase Producer (ESBL).  In bloodstream infections from ESBL organisms, carbapenems are preferred over  piperacillin/tazobactam. They are shown to have a lower risk of mortality.    Report Status 01/16/2023 FINAL  Final   Organism ID, Bacteria ESCHERICHIA COLI (A)  Final      Susceptibility   Escherichia coli - MIC*    AMPICILLIN >=32 RESISTANT Resistant     CEFAZOLIN >=64 RESISTANT Resistant     CEFEPIME >=32 RESISTANT Resistant     CEFTRIAXONE >=64 RESISTANT Resistant     CIPROFLOXACIN >=4 RESISTANT Resistant     GENTAMICIN >=16 RESISTANT Resistant     IMIPENEM <=0.25 SENSITIVE Sensitive     NITROFURANTOIN <=16 SENSITIVE Sensitive     TRIMETH/SULFA >=320 RESISTANT Resistant     AMPICILLIN/SULBACTAM 16 INTERMEDIATE Intermediate     PIP/TAZO <=4 SENSITIVE Sensitive     * >=100,000 COLONIES/mL ESCHERICHIA COLI  Culture, blood (Routine X 2) w Reflex to ID Panel     Status: None   Collection Time: 01/16/23  8:03 AM   Specimen: Right Antecubital; Blood  Result Value Ref Range Status   Specimen Description   Final    RIGHT ANTECUBITAL BOTTLES DRAWN AEROBIC AND ANAEROBIC   Special Requests   Final    Blood Culture results may not be optimal due to an excessive volume of blood received in culture bottles   Culture   Final    NO GROWTH 5 DAYS Performed at [facility], [address] [place],  [place] [address]    Report Status 01/21/2023 FINAL  Final  Culture, blood (Routine X 2) w Reflex to ID Panel     Status: None   Collection Time: 01/16/23  8:03 AM   Specimen: BLOOD RIGHT HAND  Result Value Ref Range Status   Specimen Description   Final    BLOOD RIGHT HAND BOTTLES DRAWN AEROBIC AND ANAEROBIC   Special Requests   Final    Blood Culture results may not be optimal due to an excessive volume of blood received in culture bottles   Culture   Final    NO GROWTH 5 DAYS Performed at [facility], [address], [place], [place] [address]    Report Status 01/21/2023 FINAL  Final     Radiology Studies: No results found.   Pamella Pert, MD, PhD [facility]  Between 7 am  - 7 pm I am available, please contact me via Amion (for emergencies) or Securechat (non urgent messages)  Between 7 pm - 7 am I am not available, please contact night coverage MD/APP via Amion

## 2023-01-23 NOTE — Progress Notes (Signed)
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  Has had multiple hospitalizations and ED visits in the past several months.  Postoperative course complicated by very poor oral intake, weakness, currently residing in an SNF.  She has refused to eat, drink, and take her medications.  She was admitted to [facility] with confusion.  GI was consulted and did not find an organic cause for her not eating.  After discussing with the family, a feeding [name] was recommended and she was transferred to [facility] for surgery consultation. Palliative care has been asked to continue involvement to support patient and further address goals of care as it relates to possible PEG placement,   Today's Discussion 01/23/2023  *Please note that this is a verbal dictation therefore any spelling or grammatical errors are due to the "[employer]agon [employer] One" system interpretation.  Chart reviewed inclusive of vital signs, progress notes, laboratory results, and diagnostic images.   I met with Christine [name] at bedside this morning. She was aware of self and place. She and I discussed her current health in the setting of her poor appetitie. She shares that she has no desire to eat today. She and I reviewed thet we have limited options as her blood sugars continue to decline to low levels in the absence of nutrition.   We discussed continuing care as it is now with the addition of a PEG [name]. This would allow her to get nutrition as needed though it would also causes some potential limitations and carry possible side effects. Christine [name].   We reviewed if a feeding [name] is not pursued the idea of hospice care. I shared that I described hospice as a service for patients who have a life  expectancy of 6 months or less.  The goal of hospice is the preservation of dignity and quality at the end phases of life.  Under hospice care, the focus changes from curative to symptom relief. We discussed that not eating or drinking can be voluntary certainly and hospice can guide chornically ill patients through that journey also.  We reviewed the importance of additional family conversations.   ________________________ Addendum:  I called patients sister, Christine [name] this morning. She was informed of the above. We discussed the need for a joint meeting. Christine [name] shares that she is in [place] and unable to be here physically though would be happy to partake in a phone conference. She will be accepting of whatever her sisters desires are.  ___________________________ Addendum #2:  I met at bedside with Christine [name] and we called her sister, Christine [name] on speaker-phone. We discussed the concern in the setting of Christine [name]'s poor nutritional state. I shared that she can increase her oral intake or get a feeding [name] if her goals are truly towards improvement.   We discussed that Christine [name] can always opt to pursue hospice if she does not desire aggressive efforts. I shared though that this would mean her life and time on earth would likely be more limited.  Christine [name] shares that after much thought and consideration she would like to live. She shares that she will get a gastrostomy [name]. In the meanwhile she would like to order groceries, in fact she shares that she already has through [employer] and that they are  at the front desk. I was able to go down with nursing to confirm this and bring her groceries back. I shared that we would need to monitor what she can and cannot eat. She states that she will try to eat on her own.  Christine [name] states that she did not want a feeding [name] last week as she was "just waking up" and she wanted to assert what she could or could not do on her own. She realizes how important nutrition is for  functional recovery which is her primary goal.   We further discussed resuscitation status. Christine [name] is clear that she would not desire chest compressions, shocks, or intubation if she were found pulseless. She would desire limited interventions though inclusive of antibiotics and mIVF fluids.   Goals at this time are for improvement.   Questions and concerns addressed/Palliative Support Provided.   Objective Assessment: Vital Signs Vitals:   01/23/23 0517 01/23/23 0856  BP: (!) 144/69 135/83  Pulse: 100 76  Resp: 18 16  Temp: 98.4 F (36.9 C) 98.6 F (37 C)  SpO2: 97% 100%    Intake/Output Summary (Last 24 hours) at 01/23/2023 1042 Last data filed at 01/23/2023 8416 Gross per 24 hour  Intake 610 ml  Output 600 ml  Net 10 ml   Last Weight  Most recent update: 01/23/2023  5:11 AM    Weight  107.3 kg (236 lb 8.9 oz)            Gen:  Elderly AA F in NAD HEENT: Dry mucous membranes CV: Regular rate and rhythm  PULM: On RA, breathing is even and nonlabored ABD: soft/nontender EXT: (+) edema  Neuro: Alert and oriented x3   SUMMARY OF RECOMMENDATIONS   DNAR/DNI  Will complete a MOST prior to discharge  Patient would accept PEG now if it would support her nutritional needs  Appreciate PT/OT for mobility efforts  Nursing to provide oral care TID  Chaplain support  Ongoing PMT support  Time Spent:  95 Billing based on MDM: High ______________________________________________________________________________________ Lamarr Lulas [place] Palliative Medicine Team Team Cell Phone: [phone] Please utilize secure chat with additional questions, if there is no response within 30 minutes please call the above phone number  Palliative Medicine Team providers are available by phone from 7am to 7pm daily and can be reached through the team cell phone.  Should this patient require assistance outside of these hours, please call the patient's attending  physician.

## 2023-01-23 NOTE — Progress Notes (Signed)
Pt has home CPAP unit. No assistance needed..  °

## 2023-01-24 DIAGNOSIS — Z7189 Other specified counseling: Secondary | ICD-10-CM | POA: Diagnosis not present

## 2023-01-24 DIAGNOSIS — R627 Adult failure to thrive: Secondary | ICD-10-CM | POA: Diagnosis not present

## 2023-01-24 DIAGNOSIS — Z515 Encounter for palliative care: Secondary | ICD-10-CM | POA: Diagnosis not present

## 2023-01-24 DIAGNOSIS — E870 Hyperosmolality and hypernatremia: Secondary | ICD-10-CM | POA: Diagnosis not present

## 2023-01-24 LAB — GLUCOSE, CAPILLARY
Glucose-Capillary: 49 mg/dL — ABNORMAL LOW (ref 70–99)
Glucose-Capillary: 37 mg/dL — CL (ref 70–99)
Glucose-Capillary: 36 mg/dL — CL (ref 70–99)
Glucose-Capillary: 30 mg/dL — CL (ref 70–99)
Glucose-Capillary: 128 mg/dL — ABNORMAL HIGH (ref 70–99)
Glucose-Capillary: 77 mg/dL (ref 70–99)
Glucose-Capillary: 53 mg/dL — ABNORMAL LOW (ref 70–99)
Glucose-Capillary: 94 mg/dL (ref 70–99)

## 2023-01-24 LAB — BASIC METABOLIC PANEL
Anion gap: 4 — ABNORMAL LOW (ref 5–15)
BUN: 12 mg/dL (ref 8–23)
CO2: 18 mmol/L — ABNORMAL LOW (ref 22–32)
Calcium: 8.1 mg/dL — ABNORMAL LOW (ref 8.9–10.3)
Chloride: 117 mmol/L — ABNORMAL HIGH (ref 98–111)
Creatinine, Ser: 1.09 mg/dL — ABNORMAL HIGH (ref 0.44–1.00)
GFR, Estimated: 55 mL/min — ABNORMAL LOW (ref >60)
Glucose, Bld: 76 mg/dL (ref 70–99)
Potassium: 3.8 mmol/L (ref 3.5–5.1)
Sodium: 139 mmol/L (ref 135–145)

## 2023-01-24 MED ORDER — DEXTROSE 50 % IV SOLN
25.0000 g | INTRAVENOUS | Status: AC
  Administered 2023-01-24: 25 g via INTRAVENOUS
  Filled 2023-01-24: qty 50

## 2023-01-24 MED ORDER — DEXTROSE 50 % IV SOLN
12.5000 g | INTRAVENOUS | Status: AC
  Administered 2023-01-24: 12.5 g via INTRAVENOUS
  Filled 2023-01-24: qty 50

## 2023-01-24 MED ORDER — DEXTROSE 10 % IV SOLN
INTRAVENOUS | Status: DC
  Filled 2023-01-24: qty 1000

## 2023-01-24 NOTE — Progress Notes (Signed)
   01/24/23 2134  BiPAP/CPAP/SIPAP  BiPAP/CPAP/SIPAP Pt Type Adult  BiPAP/CPAP/SIPAP Resmed  Mask Type Nasal pillows  FiO2 (%) 21 %  Patient Home Equipment Yes  Safety Check Completed by RT for Home Unit Yes, no issues noted  BiPAP/CPAP /SiPAP Vitals  Pulse Rate 90  Resp 18  SpO2 95 %  Bilateral Breath Sounds Clear;Diminished  MEWS Score/Color  MEWS Score 0  MEWS Score Color Christine [name]

## 2023-01-24 NOTE — Progress Notes (Signed)
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  Has had multiple hospitalizations and ED visits in the past several months.  Postoperative course complicated by very poor oral intake, weakness, currently residing in an SNF.  She has refused to eat, drink, and take her medications.  She was admitted to [facility] with confusion.  GI was consulted and did not find an organic cause for her not eating.  After discussing with the family, a feeding tube was recommended and she was transferred to [facility] for surgery consultation.  Subjective / 24h Interval events: Has no complaints for me.  Persistently hypoglycemic  Assesement and Plan: Principal Problem:   Hypernatremia Active Problems:   Acute metabolic encephalopathy   Essential hypertension   Gastro-esophageal reflux disease with esophagitis   Osteoarthrosis   OSA on CPAP   GAD (generalized anxiety disorder)   Mixed incontinence urge and stress   Morbid obesity (HCC)   Abdominal pain   Gout   Gastroesophageal reflux disease   Prediabetes   Memory difficulties   Hyperlipidemia   Severe dehydration   Altered mental status   Failure to thrive in adult   AKI (acute kidney injury) (HCC)   Gallbladder dilatation   Esophageal dysphagia   Abnormal esophagram   Hypoglycemia   Principal problem Hypernatremia / Severe Dehydration - pt presented with severe clinical findings of free water deficit.  Has received IV fluids and sodium is now normalized.   -Still not eating, getting more dehydrated with slight elevation in her creatinine, resume IV fluids today   Active problems Hypoglycemia/Failure to Thrive - due poor oral intake  and recent gastric bypass.  Continue to monitor CBGs. Am cortisol 25.7. TSH 1.715. W11--9147. Folate 8.6 -Continues to have recurrent hypoglycemia, refusing to drink orange juice requiring frequent IV glucose.  Placed on dextrose infusion this morning  CBG (last 3)  Recent Labs    01/24/23 0746 01/24/23 0852 01/24/23 0953  GLUCAP 49* 37* 36*    Adult Failure to Thrive/Esophageal dysmotility -Patient continues to have poor oral intake and no desire to eat. She continues to " pocket" her pills and food. Pt's sister traveled by train from [place] to come here to visit and help with arrangements for post hospital care -9/20 Esophagram--advanced esophageal dysmotility but no obstruction.  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  GI consulted, they do not appreciate this being an issue for which she could not eat.  After discussions with family, she was transferred to [facility] for surgical evaluation for gastrostomy tube.  General surgery following, for now hold off feeding tube but may need to reconsider given persistent hypoglycemia  UTI--ESBL E coli -9/20 UA>50 WBC. 9/20 urine culture = MDR Ecoli.  Has been placed on meropenem, status post 7 days completed 9/29  Abdominal pain -9/14 CT abd--limited by motion, but  No obvious obstruction, free air or free fluid; s/p gastric bypass. 9/15 Abd US--GB distended, minimal GB sludge.  LFTs were normal.  She also underwent a HIDA scan which showed patent cystic and common bile ducts   NAGMA -improved after bicarb drip   Hypokalemia/Hypomagnesemia/Hypophosphatemia -due to monitor and  replace as indicated   AKI -baseline creatinine 0.6-1.0, -serum creatinine peaked 1.66, and is currently at baseline   Acute metabolic encephalopathy/Failure to Thrive - secondary to severe hypernatremia, AKI, mental status now back to baseline  OSA  - nightly CPAP  ordered    Essential hypertension  -continue to monitor blood pressure   Morbid Obesity - pt is postop s/p 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 Dossie Der.  BMI 43  GERD - continue PPI   Sacral pressure wounds - noted below, appreciate wound care nursing team consultation   Goals of care -discussed with sister, brother, patient>>pt does not want gastrostomy tube -pt understands that there is nothing medically prohibitive affecting her swallowing and that she is able to eat. she acknowledges that she chooses not to chew and swallow -consult palliative medicine -numerous GOC discussions with pt and brother and sister who are patient's main advocate--not ready for full comfort -Hold gastrostomy tube placement for now, monitor calories  Scheduled Meds:  antiseptic oral rinse  15 mL Mouth Rinse BID   calcium carbonate  200 mg of elemental calcium Oral TID   Chlorhexidine Gluconate Cloth  6 each Topical Daily   cholecalciferol  5,000 Units Oral Daily   colchicine  0.6 mg Oral Daily   DULoxetine  60 mg Oral Daily   enoxaparin (LOVENOX) injection  40 mg Subcutaneous Q24H   feeding supplement  237 mL Oral BID BM   feeding supplement (GLUCERNA SHAKE)  237 mL Oral TID BM   ferrous sulfate  325 mg Oral Q breakfast   folic acid  1 mg Intravenous Daily   Gerhardt's butt cream   Topical BID   megestrol  400 mg Oral Daily   multivitamin with minerals  1 tablet Oral BID   mouth rinse  15 mL Mouth Rinse TID   oxybutynin  15 mg Oral QHS   pantoprazole (PROTONIX) IV  40 mg Intravenous Q12H   sodium chloride flush  10-40 mL Intracatheter Q12H   thiamine (VITAMIN B1) injection  100 mg Intravenous Daily   Continuous Infusions:  dextrose 75 mL/hr at 01/24/23 1009   PRN Meds:.acetaminophen **OR** acetaminophen, diazepam, diclofenac Sodium, fentaNYL (SUBLIMAZE) injection, ondansetron **OR** ondansetron (ZOFRAN) IV, oxyCODONE, sodium chloride flush, traZODone  Current Outpatient  Medications  Medication Instructions   acetaminophen (TYLENOL) 650 mg, Oral, Every 4 hours PRN   amLODipine (NORVASC) 10 mg, Oral, Daily   antiseptic oral rinse (BIOTENE) LIQD 15 mLs, Mouth Rinse, 2 times daily   ascorbic acid (VITAMIN C) 500 mg, Oral, Daily   colchicine 1.2 mg, Oral, Daily   diclofenac Sodium (VOLTAREN) 2 g, Topical, 4 times daily   DULoxetine (CYMBALTA) 60 mg, Oral, Daily   ferrous sulfate 325 mg, Oral, Daily with breakfast   folic acid (FOLVITE) 1 mg, Oral, Daily   gabapentin (NEURONTIN) 100 mg, Oral, 3 times daily   Multiple Vitamin (MULTIVITAMIN WITH MINERALS) TABS tablet 1 tablet, Oral, Daily   Nutritional Supplements (PROMOD) LIQD 30 mLs, Oral, 2 times daily   omeprazole (PRILOSEC) 20 mg, Oral, Daily   oxybutynin (DITROPAN XL) 15 mg, Oral, Daily at bedtime   promethazine (PHENERGAN) 25 mg, Oral, Every 6 hours PRN   senna (SENOKOT) 8.6 MG TABS tablet 1 tablet, Oral, Daily at bedtime   sodium chloride irrigation 0.9 % irrigation 75 mLs, Irrigation, See admin instructions, Use 75 mL/hr intravenously every shift for fluid resuscitation for 2 days   traMADol (ULTRAM) 50 mg, Oral, 3 times daily PRN  triamterene-hydrochlorothiazide (MAXZIDE-25) 37.5-25 MG tablet 1 tablet, Oral, Daily   zinc gluconate 50 mg, Oral, Daily    Diet Orders (From admission, onward)     Start     Ordered   01/20/23 1500  Diet regular Room service appropriate? Yes with Assist; Fluid consistency: Thin  Diet effective now       Question Answer Comment  Room service appropriate? Yes with Assist   Fluid consistency: Thin      01/20/23 1459            DVT prophylaxis: enoxaparin (LOVENOX) injection 40 mg Start: 01/08/23 2200   Lab Results  Component Value Date   PLT 310 01/23/2023      Code Status: Limited: Do not attempt resuscitation (DNR) -DNR-LIMITED -Do Not Intubate/DNI   Family Communication: no family at bedside   Status is: Inpatient Remains inpatient appropriate  because: severity of illness  Level of care: Med-Surg  Consultants:  Palliative care General surgery GI  Objective: Vitals:   01/23/23 1723 01/23/23 2028 01/24/23 0500 01/24/23 0744  BP: (!) 129/94 (!) 145/71 (!) 140/76 (!) 141/86  Pulse: 96 87 92 85  Resp: 16 16 19 18   Temp: 98.3 F (36.8 C) 98.3 F (36.8 C) 98.6 F (37 C) 98.2 F (36.8 C)  TempSrc: Oral Oral Axillary Oral  SpO2: 99% 100% 98%   Weight:   107.6 kg   Height:        Intake/Output Summary (Last 24 hours) at 01/24/2023 1041 Last data filed at 01/24/2023 0955 Gross per 24 hour  Intake 60 ml  Output 200 ml  Net -140 ml   Wt Readings from Last 3 Encounters:  01/24/23 107.6 kg  11/14/22 114.6 kg  10/27/22 119.1 kg    Examination:  Constitutional: NAD Eyes: lids and conjunctivae normal, no scleral icterus ENMT: mmm Neck: normal, supple Respiratory: clear to auscultation bilaterally, no wheezing, no crackles. Normal respiratory effort.  Cardiovascular: Regular rate and rhythm, no murmurs / rubs / gallops. No LE edema. Abdomen: soft, no distention, no tenderness. Bowel sounds positive.   Data Reviewed: I have independently reviewed following labs and imaging studies   CBC Recent Labs  Lab 01/18/23 0433 01/20/23 0211 01/21/23 0400 01/23/23 0402  WBC 8.2 5.5 5.9 5.5  HGB 8.3* 8.5* 8.5* 8.6*  HCT 25.7* 27.2* 27.7* 26.7*  PLT 280 311 323 310  MCV 81.8 83.7 84.7 85.6  MCH 26.4 26.2 26.0 27.6  MCHC 32.3 31.3 30.7 32.2  RDW 21.7* 21.3* 21.2* 21.2*    Recent Labs  Lab 01/18/23 0433 01/20/23 0211 01/21/23 0400 01/22/23 0449 01/23/23 0402 01/24/23 0504  NA 140 139 138  --  138 139  K 3.3* 3.5 3.4*  --  3.6 3.8  CL 111 110 112*  --  114* 117*  CO2 20* 21* 20*  --  20* 18*  GLUCOSE 76 86 80  --  80 76  BUN 20 12 14   --  13 12  CREATININE 1.22* 1.11* 0.92 1.01* 0.83 1.09*  CALCIUM 7.6* 7.7* 7.7*  --  8.0* 8.1*  AST  --  19 14*  --   --   --   ALT  --  17 17  --   --   --   ALKPHOS  --  107  95  --   --   --   BILITOT  --  0.8 1.0  --   --   --   ALBUMIN  --  1.9* 1.9*  --   --   --  MG 1.9 1.8 1.9  --  1.7  --     ------------------------------------------------------------------------------------------------------------------ No results for input(s): "CHOL", "HDL", "LDLCALC", "TRIG", "CHOLHDL", "LDLDIRECT" in the last 72 hours.  Lab Results  Component Value Date   HGBA1C 6.3 (H) 09/01/2022   ------------------------------------------------------------------------------------------------------------------ No results for input(s): "TSH", "T4TOTAL", "T3FREE", "THYROIDAB" in the last 72 hours.  Invalid input(s): "FREET3"  Cardiac Enzymes No results for input(s): "CKMB", "TROPONINI", "MYOGLOBIN" in the last 168 hours.  Invalid input(s): "CK" ------------------------------------------------------------------------------------------------------------------ No results found for: "BNP"  CBG: Recent Labs  Lab 01/23/23 2151 01/23/23 2320 01/24/23 0746 01/24/23 0852 01/24/23 0953  GLUCAP 56* 110* 49* 37* 36*    Recent Results (from the past 240 hour(s))  Culture, blood (Routine X 2) w Reflex to ID Panel     Status: None   Collection Time: 01/16/23  8:03 AM   Specimen: Right Antecubital; Blood  Result Value Ref Range Status   Specimen Description   Final    RIGHT ANTECUBITAL BOTTLES DRAWN AEROBIC AND ANAEROBIC   Special Requests   Final    Blood Culture results may not be optimal due to an excessive volume of blood received in culture bottles   Culture   Final    NO GROWTH 5 DAYS Performed at [facility], [address] [place], [place] [address]    Report Status 01/21/2023 FINAL  Final  Culture, blood (Routine X 2) w Reflex to ID Panel     Status: None   Collection Time: 01/16/23  8:03 AM   Specimen: BLOOD RIGHT HAND  Result Value Ref Range Status   Specimen Description   Final    BLOOD RIGHT HAND BOTTLES DRAWN AEROBIC AND ANAEROBIC   Special Requests    Final    Blood Culture results may not be optimal due to an excessive volume of blood received in culture bottles   Culture   Final    NO GROWTH 5 DAYS Performed at [facility], [address], [place], [place] [address]    Report Status 01/21/2023 FINAL  Final     Radiology Studies: No results found.   Pamella Pert, MD, PhD [facility]  Between 7 am - 7 pm I am available, please contact me via Amion (for emergencies) or Securechat (non urgent messages)  Between 7 pm - 7 am I am not available, please contact night coverage MD/APP via Amion

## 2023-01-24 NOTE — Progress Notes (Incomplete)
Calorie Count Note  *** hour calorie count ordered.  Diet: Regular Supplements: Glucerna Shakes TID, ProSource Plus BID, Carnation Instant Breakfast BID  Saturday-  Breakfast: *** Lunch: *** Dinner: *** Supplements: ***  Sunday-  Breakfast: *** Lunch: *** Dinner: *** Supplements: ***  Total intake: *** kcal (***% of minimum estimated needs)  *** protein (***% of minimum estimated needs)  Nutrition Dx: Increased nutrient needs related to wound healing as evidenced by estimated needs; ongoing   Goal: Patient will meet greater than or equal to 90% of their needs; unmet    Intervention:    Continue 48 hour calorie count Recommend continuing regular diet with emphasis on protein Protein containing snacks TID between meals Glucerna Shake po TID, each supplement provides 220 kcal and 10 grams of protein Discontinue ProSource Plus Carnation Breakfast Essentials BID, each packet mixed with 8 ounces of 2% milk provides 13 grams of protein and 260 calories. Bariatric vitamin regimen: MVI BID and TUMS TID  Drusilla Kanner, RDN, LDN Clinical Nutrition

## 2023-01-24 NOTE — Progress Notes (Signed)
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  Has had multiple hospitalizations and ED visits in the past several months.  Postoperative course complicated by very poor oral intake, weakness, currently residing in an SNF.  She has refused to eat, drink, and take her medications.  She was admitted to [facility] with confusion.  GI was consulted and did not find an organic cause for her not eating.  After discussing with the family, a feeding tube was recommended and she was transferred to [facility] for surgery consultation. Palliative care has been asked to continue involvement to support patient and further address goals of care as it relates to possible PEG placement,   Today's Discussion 01/24/2023  *Please note that this is a verbal dictation therefore any spelling or grammatical errors are due to the "Dragon Medical One" system interpretation.  Chart reviewed inclusive of vital signs, progress notes, laboratory results, and diagnostic images.   I spoke to patients RN this morning. Ongoing concerns regarding hypoglycemia.  I met with Christine [name] at bedside this morning. She is vocalizing the desire to get out of bed this morning. I shared that the physical therapist will be seeing her today. I expressed that she will need help given her degree of generalized weakness.   We reviewed the plan at this point to continue with the thought of PEG placement as Christine [name]'s blood sugars continue to drop. She is still not eating sufficiently. Christine [name] is in agreement with PEG placement though I shared the surgical team will need to see her to determine what can and cannot be pursued.   From a symptom standpoint Christine [name] denies pain, nausea,or shortness of breath this morning.   Questions and concerns  addressed/Palliative Support Provided.   Objective Assessment: Vital Signs Vitals:   01/24/23 0500 01/24/23 0744  BP: (!) 140/76 (!) 141/86  Pulse: 92 85  Resp: 19 18  Temp: 98.6 F (37 C) 98.2 F (36.8 C)  SpO2: 98%     Intake/Output Summary (Last 24 hours) at 01/24/2023 1030 Last data filed at 01/24/2023 1478 Gross per 24 hour  Intake 60 ml  Output 200 ml  Net -140 ml   Last Weight  Most recent update: 01/24/2023  5:35 AM    Weight  107.6 kg (237 lb 3.4 oz)            Gen:  Elderly AA F in NAD HEENT: Dry mucous membranes CV: Regular rate and rhythm  PULM: On RA, breathing is even and nonlabored ABD: soft/nontender EXT: (+) edema  Neuro: Alert and oriented x3   SUMMARY OF RECOMMENDATIONS   DNAR/DNI  Will complete a MOST prior to discharge  Patient would accept PEG now if it would support her nutritional needs --> Surgery communicated with by primary  Appreciate PT/OT for mobility efforts  Nursing to provide oral care TID  Chaplain support  Ongoing PMT support  Billing based on MDM: Moderate  ______________________________________________________________________________________ Lamarr Lulas [facility]ruff Palliative Medicine Team Team Cell Phone: [phone] Please utilize secure chat with additional questions, if there is no response within 30 minutes please call the above phone number  Palliative Medicine Team providers are available by phone from 7am to 7pm daily and can be reached through the team cell phone.  Should this patient require assistance outside of these hours, please call  the patient's attending physician.

## 2023-01-24 NOTE — Progress Notes (Signed)
This chaplain responded to PMT NP-Michelle's consult for spiritual care. The Pt. brother is at the bedside at the time of the visit and decides to step away as the chaplain begins rapport building with the Pt.  The Pt. is nibbling on watermelon, banana pudding, and a sandwich during the visit.  The chaplain listened reflectively as the Pt. began story telling about her family role as the oldest sibling. The Pt. connects her birth order to her personality and her request to be more in control of medical decisions and how she is medically treated. The Pt. affirmed the importance of being moved to the center of her care as she accepts Tuesday's gastric tube placement.  The chaplain understands the Pt. optimistically views gastric tube placement as leading to her personal goals of home choice and a pet.   This chaplain is available for F/U spiritual care as needed.  Chaplain Stephanie Acre [phone]

## 2023-01-24 NOTE — Progress Notes (Signed)
Patient had CBG at 8:52 of 49. RN and NT educated and encouraged patient to drink provided cola/juice. Patient agreed she would, but then drank very minimal amount. CBG on re-check was 37. At this time, nursing gave 25g of dextrose. Follow up BG was 37. Spoke with MD who gave orders to start D10 a 75l/hr and re-check CBG in one hour. Infusion started and CBG rechecked at 1116. CBG was 30 at that time. Spoke again to MD who asked for another 25g of dextrose to be given to patient. Dextrose was given and recheck was 128 at 12:00. RN again encouraging patient to eat/drink to maintain a normal glucose.

## 2023-01-24 NOTE — TOC Progression Note (Signed)
Transition of Care [facility]) - Progression Note    Patient Details  Name: Christine [name] MRN: [id number] Date of Birth: 1950/08/27  Transition of Care [facility] CM/SW Contact  Carley Hammed, LCSW Phone Number: 01/24/2023, 1:14 PM  Clinical Narrative:    CSW continues to follow for DC needs. Palliative and surgery reviewing. TOC is available for further needs.    Expected Discharge Plan: Skilled Nursing Facility Barriers to Discharge: Continued Medical Work up  Expected Discharge Plan and Services In-house Referral: Clinical Social Work Discharge Planning Services: CM Consult Post Acute Care Choice: Skilled Nursing Facility Living arrangements for the past 2 months: Skilled Nursing Facility                                       Social Determinants of Health (SDOH) Interventions SDOH Screenings   Food Insecurity: No Food Insecurity (01/18/2023)  Housing: Patient Declined (01/18/2023)  Transportation Needs: No Transportation Needs (01/18/2023)  Utilities: Not At Risk (01/18/2023)  Alcohol Screen: Low Risk  (07/13/2022)  Depression (PHQ2-9): Low Risk  (01/21/2022)  Financial Resource Strain: Low Risk  (12/01/2022)   Received from [facility]  Physical Activity: Insufficiently Active (07/13/2022)  Social Connections: Moderately Integrated (12/01/2022)   Received from [facility]  Stress: No Stress Concern Present (07/13/2022)  Tobacco Use: Medium Risk (01/21/2023)  Health Literacy: Medium Risk (12/01/2022)   Received from [facility]    Readmission Risk Interventions    01/09/2023   11:11 AM  Readmission Risk Prevention Plan  Transportation Screening Complete  HRI or Home Care Consult Complete  Social Work Consult for Recovery Care Planning/Counseling Complete  Palliative Care Screening Not Applicable  Medication Review Oceanographer) Complete

## 2023-01-24 NOTE — Progress Notes (Signed)
Hypoglycemic Event  CBG: 56  Treatment: D50 25 mL (12.5 gm)  Symptoms: None  Follow-up CBG: Time:1120 CBG Result:110  Possible Reasons for Event: Unknown  Comments/MD notified:    Aaleigha Bozza  Steffanie Dunn

## 2023-01-24 NOTE — Plan of Care (Signed)
  Problem: Activity: Goal: Ability to tolerate increased activity will improve Outcome: Progressing   Problem: Fluid Volume: Goal: Maintenance of adequate hydration will improve Outcome: Progressing   Problem: Pain Management: Goal: Pain level will decrease Outcome: Progressing   Problem: Clinical Measurements: Goal: Will remain free from infection Outcome: Progressing   Problem: Activity: Goal: Risk for activity intolerance will decrease Outcome: Progressing   Problem: Elimination: Goal: Will not experience complications related to bowel motility Outcome: Progressing Goal: Will not experience complications related to urinary retention Outcome: Progressing   Problem: Pain Managment: Goal: General experience of comfort will improve Outcome: Progressing   Problem: Safety: Goal: Ability to remain free from injury will improve Outcome: Progressing   Problem: Skin Integrity: Goal: Risk for impaired skin integrity will decrease Outcome: Progressing

## 2023-01-24 NOTE — Progress Notes (Addendum)
Subjective: Not eating over the weekend.  Family in hospital and agree this is likely behavioral.   Objective: Vital signs in last 24 hours: Temp:  [98.2 F (36.8 C)-98.6 F (37 C)] 98.2 F (36.8 C) (09/30 0744) Pulse Rate:  [85-96] 85 (09/30 0744) Resp:  [16-19] 18 (09/30 0744) BP: (129-145)/(71-94) 141/86 (09/30 0744) SpO2:  [98 %-100 %] 98 % (09/30 0500) Weight:  [107.6 kg] 107.6 kg (09/30 0500) Last BM Date : 01/23/23  Intake/Output from previous day: 09/29 0701 - 09/30 0700 In: -  Out: 200 [Urine:200] Intake/Output this shift: Total I/O In: 60 [P.O.:60] Out: 400 [Urine:400]  PE: Gen:  Alert, NAD, pleasant Abd: Soft, ND, NT, +BS, incisions well healed  Lab Results:  Recent Labs    01/23/23 0402  WBC 5.5  HGB 8.6*  HCT 26.7*  PLT 310   BMET Recent Labs    01/23/23 0402 01/24/23 0504  NA 138 139  K 3.6 3.8  CL 114* 117*  CO2 20* 18*  GLUCOSE 80 76  BUN 13 12  CREATININE 0.83 1.09*  CALCIUM 8.0* 8.1*   PT/INR No results for input(s): "LABPROT", "INR" in the last 72 hours. CMP     Component Value Date/Time   NA 139 01/24/2023 0504   K 3.8 01/24/2023 0504   CL 117 (H) 01/24/2023 0504   CO2 18 (L) 01/24/2023 0504   GLUCOSE 76 01/24/2023 0504   BUN 12 01/24/2023 0504   CREATININE 1.09 (H) 01/24/2023 0504   CREATININE 0.91 07/26/2017 1127   CALCIUM 8.1 (L) 01/24/2023 0504   PROT 4.9 (L) 01/21/2023 0400   ALBUMIN 1.9 (L) 01/21/2023 0400   AST 14 (L) 01/21/2023 0400   AST 15 07/26/2017 1127   ALT 17 01/21/2023 0400   ALT 14 07/26/2017 1127   ALKPHOS 95 01/21/2023 0400   BILITOT 1.0 01/21/2023 0400   BILITOT 0.5 07/26/2017 1127   GFRNONAA 55 (L) 01/24/2023 0504   GFRNONAA >60 07/26/2017 1127   GFRAA >60 02/28/2019 1022   GFRAA >60 07/26/2017 1127   Lipase     Component Value Date/Time   LIPASE 22 11/08/2022 2335    Studies/Results: No results found.  Anti-infectives: Anti-infectives (From admission, onward)    Start      Dose/Rate Route Frequency Ordered Stop   01/20/23 1430  meropenem (MERREM) 1 g in sodium chloride 0.9 % 100 mL IVPB  Status:  Discontinued        1 g 200 mL/hr over 30 Minutes Intravenous Every 8 hours 01/20/23 1338 01/24/23 0801   01/19/23 1700  meropenem (MERREM) 1 g in sodium chloride 0.9 % 100 mL IVPB  Status:  Discontinued        1 g 200 mL/hr over 30 Minutes Intravenous Every 12 hours 01/19/23 1345 01/20/23 1338   01/17/23 1515  meropenem (MERREM) 1 g in sodium chloride 0.9 % 100 mL IVPB  Status:  Discontinued        1 g 200 mL/hr over 30 Minutes Intravenous Every 8 hours 01/17/23 1428 01/19/23 1345   01/16/23 1400  piperacillin-tazobactam (ZOSYN) IVPB 3.375 g  Status:  Discontinued        3.375 g 12.5 mL/hr over 240 Minutes Intravenous Every 8 hours 01/16/23 1322 01/17/23 1428   01/14/23 1000  cefTRIAXone (ROCEPHIN) 1 g in sodium chloride 0.9 % 100 mL IVPB  Status:  Discontinued        1 g 200 mL/hr over 30 Minutes Intravenous  Every 24 hours 01/14/23 0925 01/16/23 1322   01/09/23 0930  cefTRIAXone (ROCEPHIN) 1 g in sodium chloride 0.9 % 100 mL IVPB        1 g 200 mL/hr over 30 Minutes Intravenous Every 24 hours 01/09/23 0840 01/11/23 1253      CT 9/14 - Distended gallbladder. If there is concern of gallbladder pathology ultrasound may be useful as the next step in the workup. - Grossly stable left-sided renal cystic foci. - Surgical changes of previous gastric bypass - Markedly limited examination due to the level of motion. No obvious obstruction, free air or free fluid. In addition, a repeat study could be considered when the patient is more clinically able.  RUQ [place] 9/15 1. Hepatic fatty infiltration. 2. Gallbladder distended. 3. Minimal gallbladder sludge. 4. Echogenic kidneys consistent with chronic medical renal disease. 5. Left kidney cyst. 6. There was limited visualization, as described.  Esophagram 9/20  1. Distal esophageal diverticulum with possible  associated mass causing extrinsic compression of the anterior distal esophageal wall. Further evaluation with endoscopy is recommended. 2. Significant delay in swallow initiation/mechanism. Prominent cricopharyngeal bar. 3. Advanced esophageal dysmotility with spasms. 4. Limited study due to patient's body habitus and mobility limitations.   CT chest 9/21 1. No acute cardiopulmonary process (no obvious extrinsic esophageal mass) 2. Minimal atelectasis in the left lower lobe.  HIDA 9/23 1.  Patent cystic and common bile ducts. 2. Elevated gallbladder ejection fraction as can be seen with gallbladder hyperkinesis.   Assessment/Plan Christine [name] is a 72 y.o. female with hx of robotic gastric bypass on 09/14/2022 by Dr. Dossie Der on 09/14/22 who is here with FTT - Unsure etiology of mood and mentation issues postoperatively, may have been due to thiamine deficiency, change in antidepressant absorption after gastric bypass, UTI?  Whatever the cause, I think this was also the cause of her inability to eat - She didn't eat anything all weekend and struggled with hypoglycemia - Discussed with patient, brother (in hall) and sister (over the phone).  Family agrees that she is refusing to eat due to mental health issues, and understand there is no clear organic cause of her failure to thrive.    - At this point I recommend laparoscopic remnant gastrostomy tube placement, laparoscopic cholecystectomy with intra-operative cholangiogram, and upper endoscopy.  She has gallstones and removing her gallbladder will eliminate that as a cause of her failure to thrive.  Upper endoscopy will help rule out ulcer or other cause of her failure to thrive.  A feeding tube will give [place] enteral access to address her nutrition.  I discussed this with the patient, brother (in hall) and sister (over the phone) who all voiced agreement.  I think I will be able to get this done tomorrow late afternoon.  Please keep  patient NPO after midnight. - Patient will need safe discharge plan - Patient will need continued psychiatric care postoperatively as she is demonstrating self harm by refusing to eat.  Will re-consult psychiatric team to help direct this care.  If psychiatric team does not feel she needs inpatient care, she will need connected to outpatient psychiatric support.     LOS: 16 days   Quentin Ore, MD [facility] 01/24/2023, 1:28 PM Please see Amion for pager number during day hours 7:00am-4:30pm

## 2023-01-24 NOTE — Progress Notes (Addendum)
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 Dossie Der.  She has been progressively declining since surgery.  She has had multiple hospitalizations and ED visits in past several months.  Recently seen at [facility] on 01/[facility]23 with complaints of weakness and poor oral intake.  She was hospitalized in July and August of this year at different facilities for dehydration, weakness, acute renal failure and failure to thrive.  She has been residing in a SNF.  She apparently has not been eating or drinking and refusing to take her medications.  She was sent to ER today for evaluation of altered mentation.  She has had 3 days of progressive weakness and refusing oral intake.  She complains of pain all over body which is not new per record review. Pt is a poor historian.   She was found to be severely dehydrated with hypoglycemia and hypernatremic with a sodium of 157.  She also had acute kidney injury and distended gallbladder on CT scan.  CT head with no acute findings.  Pt was started on IV hydration and admission was requested.    PT Comments  Pt with fair tolerance to treatment today. Pt today was able to stand with RW +2 Max A and take a few sidesteps and forward steps however fatigued quickly and was unable to transfer to chair. No change in DC/DME recs at this time. Pt could progress to standing in stedy next session. PT will continue to follow.    If plan is discharge home, recommend the following: A lot of help with bathing/dressing/bathroom;Help with stairs or ramp for entrance;Assistance with cooking/housework;Two people to help with walking and/or transfers   Can travel by private vehicle     No  Equipment  Recommendations  Other (comment) (Per accepting facility)    Recommendations for Other Services       Precautions / Restrictions Precautions Precautions: Fall Restrictions Weight Bearing Restrictions: No     Mobility  Bed Mobility Overal bed mobility: Needs Assistance Bed Mobility: Supine to Sit, Sit to Supine     Supine to sit: +2 for physical assistance, Mod assist Sit to supine: +2 for physical assistance, Max assist   General bed mobility comments: Pt able to bring her legs off the bed with CGA and increased time however required +2 Mod A for trunk elevation. +2 Max A to return to bed via helicopter method.    Transfers Overall transfer level: Needs assistance Equipment used: Rolling walker (2 wheels) Transfers: Sit to/from Stand Sit to Stand: +2 physical assistance, Max assist           General transfer comment: Pt able to stand with +2 Max A after multiple attempts. Pt able to take sidesteps and forward steps hwoever fatigued very quickly. Unable to ambulate to chair at this time. Max cues for hand placement and safety as pt kept trying to pull up with RW.    Ambulation/Gait               General Gait Details: Pt unable   Stairs             Wheelchair Mobility     Tilt Bed    Modified Rankin (Stroke Patients Only)       Balance Overall  balance assessment: Needs assistance Sitting-balance support: Feet supported, No upper extremity supported Sitting balance-Leahy Scale: Fair     Standing balance support: Reliant on assistive device for balance, During functional activity, Bilateral upper extremity supported Standing balance-Leahy Scale: Poor Standing balance comment: Reliant on RW                            Cognition Arousal: Alert Behavior During Therapy: Flat affect, WFL for tasks assessed/performed Overall Cognitive Status: No family/caregiver present to determine baseline cognitive functioning                                  General Comments: Pt appeared to be alert and oriented however would make several random and questionable remarks. Unsure if this is baseline.        Exercises      General Comments General comments (skin integrity, edema, etc.): VSS      Pertinent Vitals/Pain Pain Assessment Pain Assessment: Faces Faces Pain Scale: Hurts little more Pain Location: PICC line Pain Descriptors / Indicators: Sore, Grimacing, Discomfort, Moaning Pain Intervention(s): Monitored during session, Repositioned    Home Living                          Prior Function            PT Goals (current goals can now be found in the care plan section) Progress towards PT goals: Progressing toward goals    Frequency    Min 1X/week      PT Plan      Co-evaluation              AM-PAC PT "6 Clicks" Mobility   Outcome Measure  Help needed turning from your back to your side while in a flat bed without using bedrails?: A Lot Help needed moving from lying on your back to sitting on the side of a flat bed without using bedrails?: A Lot Help needed moving to and from a bed to a chair (including a wheelchair)?: Total Help needed standing up from a chair using your arms (e.g., wheelchair or bedside chair)?: A Lot Help needed to walk in hospital room?: Total Help needed climbing 3-5 steps with a railing? : Total 6 Click Score: 9    End of Session Equipment Utilized During Treatment: Gait belt Activity Tolerance: Patient tolerated treatment well;Patient limited by fatigue Patient left: in bed;with call bell/phone within reach;with nursing/sitter in room Nurse Communication: Mobility status PT Visit Diagnosis: Unsteadiness on feet (R26.81);Other abnormalities of gait and mobility (R26.89);Muscle weakness (generalized) (M62.81)     Time: 2841-3244 PT Time Calculation (min) (ACUTE ONLY): 38 min  Charges:    $Therapeutic Activity: 38-52 mins PT General  Charges $$ ACUTE PT VISIT: 1 Visit                     Christine [name], PT, DPT Acute Rehab Services [phone]    Christine [name] 01/24/2023, 4:09 PM

## 2023-01-24 NOTE — Progress Notes (Signed)
PT Cancellation Note  Patient Details Name: Christine [name] MRN: [id number] DOB: 01-19-1951   Cancelled Treatment:    Reason Eval/Treat Not Completed: Other (comment) (Pt eating lunch and having discussion with palliative care. Requested PT to come back later. PT will follow up if time allows.)   [name] 01/24/2023, 2:41 PM

## 2023-01-25 ENCOUNTER — Inpatient Hospital Stay (HOSPITAL_COMMUNITY): Payer: Medicare HMO

## 2023-01-25 ENCOUNTER — Other Ambulatory Visit: Payer: Self-pay

## 2023-01-25 ENCOUNTER — Encounter (HOSPITAL_COMMUNITY): Payer: Self-pay | Admitting: Family Medicine

## 2023-01-25 ENCOUNTER — Encounter (HOSPITAL_COMMUNITY)
Admission: EM | Disposition: A | Discharge status: Skilled Nursing Facility | Payer: Self-pay | Source: Skilled Nursing Facility | Attending: Internal Medicine

## 2023-01-25 DIAGNOSIS — R627 Adult failure to thrive: Secondary | ICD-10-CM

## 2023-01-25 DIAGNOSIS — I2510 Atherosclerotic heart disease of native coronary artery without angina pectoris: Secondary | ICD-10-CM

## 2023-01-25 DIAGNOSIS — K8020 Calculus of gallbladder without cholecystitis without obstruction: Secondary | ICD-10-CM | POA: Diagnosis not present

## 2023-01-25 DIAGNOSIS — Z515 Encounter for palliative care: Secondary | ICD-10-CM | POA: Diagnosis not present

## 2023-01-25 DIAGNOSIS — G9341 Metabolic encephalopathy: Secondary | ICD-10-CM | POA: Diagnosis not present

## 2023-01-25 DIAGNOSIS — Z7189 Other specified counseling: Secondary | ICD-10-CM | POA: Diagnosis not present

## 2023-01-25 HISTORY — PX: LAPAROSCOPIC INSERTION GASTROSTOMY TUBE: SHX6817

## 2023-01-25 HISTORY — PX: CHOLECYSTECTOMY: SHX55

## 2023-01-25 HISTORY — PX: ESOPHAGOGASTRODUODENOSCOPY: SHX5428

## 2023-01-25 LAB — COMPREHENSIVE METABOLIC PANEL
ALT: 15 U/L (ref 0–44)
AST: 18 U/L (ref 15–41)
Albumin: 1.8 g/dL — ABNORMAL LOW (ref 3.5–5.0)
Alkaline Phosphatase: 84 U/L (ref 38–126)
Anion gap: 3 — ABNORMAL LOW (ref 5–15)
BUN: 9 mg/dL (ref 8–23)
CO2: 19 mmol/L — ABNORMAL LOW (ref 22–32)
Calcium: 7.9 mg/dL — ABNORMAL LOW (ref 8.9–10.3)
Chloride: 113 mmol/L — ABNORMAL HIGH (ref 98–111)
Creatinine, Ser: 0.81 mg/dL (ref 0.44–1.00)
GFR, Estimated: >60 mL/min (ref >60)
Glucose, Bld: 83 mg/dL (ref 70–99)
Potassium: 3.6 mmol/L (ref 3.5–5.1)
Sodium: 135 mmol/L (ref 135–145)
Total Bilirubin: 0.5 mg/dL (ref 0.3–1.2)
Total Protein: 4.7 g/dL — ABNORMAL LOW (ref 6.5–8.1)

## 2023-01-25 LAB — TYPE AND SCREEN
ABO/RH(D): O POS
Antibody Screen: NEGATIVE

## 2023-01-25 LAB — GLUCOSE, CAPILLARY
Glucose-Capillary: 77 mg/dL (ref 70–99)
Glucose-Capillary: 74 mg/dL (ref 70–99)
Glucose-Capillary: 70 mg/dL (ref 70–99)
Glucose-Capillary: 57 mg/dL — ABNORMAL LOW (ref 70–99)
Glucose-Capillary: 68 mg/dL — ABNORMAL LOW (ref 70–99)
Glucose-Capillary: 101 mg/dL — ABNORMAL HIGH (ref 70–99)
Glucose-Capillary: 108 mg/dL — ABNORMAL HIGH (ref 70–99)
Glucose-Capillary: 95 mg/dL (ref 70–99)

## 2023-01-25 LAB — POCT I-STAT EG7
Acid-base deficit: 16 mmol/L — ABNORMAL HIGH (ref 0.0–2.0)
Bicarbonate: 12.4 mmol/L — ABNORMAL LOW (ref 20.0–28.0)
Calcium, Ion: 0.99 mmol/L — ABNORMAL LOW (ref 1.15–1.40)
HCT: 26 % — ABNORMAL LOW (ref 36.0–46.0)
Hemoglobin: 8.8 g/dL — ABNORMAL LOW (ref 12.0–15.0)
O2 Saturation: 89 %
Potassium: 2.5 mmol/L — CL (ref 3.5–5.1)
Sodium: 138 mmol/L (ref 135–145)
TCO2: 14 mmol/L — ABNORMAL LOW (ref 22–32)
pCO2, Ven: 38.2 mm[Hg] — ABNORMAL LOW (ref 44–60)
pH, Ven: 7.118 — CL (ref 7.25–7.43)
pO2, Ven: 74 mm[Hg] — ABNORMAL HIGH (ref 32–45)

## 2023-01-25 LAB — CBC
HCT: 26.6 % — ABNORMAL LOW (ref 36.0–46.0)
Hemoglobin: 8.5 g/dL — ABNORMAL LOW (ref 12.0–15.0)
MCH: 27.2 pg (ref 26.0–34.0)
MCHC: 32 g/dL (ref 30.0–36.0)
MCV: 85.3 fL (ref 80.0–100.0)
Platelets: 291 10*3/uL (ref 150–400)
RBC: 3.12 MIL/uL — ABNORMAL LOW (ref 3.87–5.11)
RDW: 21.8 % — ABNORMAL HIGH (ref 11.5–15.5)
WBC: 6.4 10*3/uL (ref 4.0–10.5)
nRBC: 0 % (ref 0.0–0.2)

## 2023-01-25 LAB — MAGNESIUM: Magnesium: 1.8 mg/dL (ref 1.7–2.4)

## 2023-01-25 SURGERY — INSERTION, GASTROSTOMY TUBE, PERCUTANEOUS
Anesthesia: General | Site: Mouth

## 2023-01-25 MED ORDER — ALBUMIN HUMAN 5 % IV SOLN
INTRAVENOUS | Status: DC | PRN
Start: 2023-01-25 — End: 2023-01-25

## 2023-01-25 MED ORDER — DEXTROSE 50 % IV SOLN
INTRAVENOUS | Status: AC
  Administered 2023-01-25: 12.5 g via INTRAVENOUS
  Filled 2023-01-25: qty 50

## 2023-01-25 MED ORDER — DEXTROSE 50 % IV SOLN
12.5000 g | INTRAVENOUS | Status: AC
  Filled 2023-01-25: qty 50

## 2023-01-25 MED ORDER — FENTANYL CITRATE (PF) 100 MCG/2ML IJ SOLN
INTRAMUSCULAR | Status: AC
  Filled 2023-01-25: qty 2

## 2023-01-25 MED ORDER — ONDANSETRON HCL 4 MG/2ML IJ SOLN
INTRAMUSCULAR | Status: DC | PRN
  Administered 2023-01-25: 4 mg via INTRAVENOUS

## 2023-01-25 MED ORDER — ONDANSETRON HCL 4 MG/2ML IJ SOLN: 4.0000 mg | Freq: Once | INTRAMUSCULAR | Status: DC | PRN

## 2023-01-25 MED ORDER — DEXAMETHASONE SODIUM PHOSPHATE 10 MG/ML IJ SOLN
INTRAMUSCULAR | Status: DC | PRN
  Administered 2023-01-25: 10 mg via INTRAVENOUS

## 2023-01-25 MED ORDER — CEFAZOLIN SODIUM-DEXTROSE 2-4 GM/100ML-% IV SOLN
2.0000 g | INTRAVENOUS | Status: AC
  Administered 2023-01-25: 2 g via INTRAVENOUS
  Filled 2023-01-25: qty 100

## 2023-01-25 MED ORDER — POTASSIUM CHLORIDE 10 MEQ/100ML IV SOLN
INTRAVENOUS | Status: DC | PRN
Start: 2023-01-25 — End: 2023-01-25
  Administered 2023-01-25: 10 meq via INTRAVENOUS

## 2023-01-25 MED ORDER — SUCCINYLCHOLINE CHLORIDE 200 MG/10ML IV SOSY
PREFILLED_SYRINGE | INTRAVENOUS | Status: AC
  Filled 2023-01-25: qty 10

## 2023-01-25 MED ORDER — AMISULPRIDE (ANTIEMETIC) 5 MG/2ML IV SOLN: 10.0000 mg | Freq: Once | INTRAVENOUS | Status: DC | PRN

## 2023-01-25 MED ORDER — DEXMEDETOMIDINE HCL IN NACL 80 MCG/20ML IV SOLN
INTRAVENOUS | Status: DC | PRN
  Administered 2023-01-25: 8 ug via INTRAVENOUS

## 2023-01-25 MED ORDER — SUCCINYLCHOLINE CHLORIDE 200 MG/10ML IV SOSY
PREFILLED_SYRINGE | INTRAVENOUS | Status: DC | PRN
  Administered 2023-01-25: 100 mg via INTRAVENOUS

## 2023-01-25 MED ORDER — BUPIVACAINE-EPINEPHRINE (PF) 0.25% -1:200000 IJ SOLN
INTRAMUSCULAR | Status: AC
  Filled 2023-01-25: qty 30

## 2023-01-25 MED ORDER — LABETALOL HCL 5 MG/ML IV SOLN
INTRAVENOUS | Status: DC | PRN
Start: 2023-01-25 — End: 2023-01-25
  Administered 2023-01-25: 5 mg via INTRAVENOUS

## 2023-01-25 MED ORDER — 0.9 % SODIUM CHLORIDE (POUR BTL) OPTIME
TOPICAL | Status: DC | PRN
  Administered 2023-01-25: 1000 mL

## 2023-01-25 MED ORDER — MIDAZOLAM HCL 2 MG/2ML IJ SOLN
INTRAMUSCULAR | Status: AC
  Filled 2023-01-25: qty 2

## 2023-01-25 MED ORDER — PHENYLEPHRINE 80 MCG/ML (10ML) SYRINGE FOR IV PUSH (FOR BLOOD PRESSURE SUPPORT)
PREFILLED_SYRINGE | INTRAVENOUS | Status: AC
  Filled 2023-01-25: qty 10

## 2023-01-25 MED ORDER — FENTANYL CITRATE (PF) 250 MCG/5ML IJ SOLN
INTRAMUSCULAR | Status: AC
  Filled 2023-01-25: qty 5

## 2023-01-25 MED ORDER — PROPOFOL 10 MG/ML IV BOLUS
INTRAVENOUS | Status: DC | PRN
Start: 2023-01-25 — End: 2023-01-25
  Administered 2023-01-25: 50 mg via INTRAVENOUS
  Administered 2023-01-25: 100 mg via INTRAVENOUS

## 2023-01-25 MED ORDER — LACTATED RINGERS IV SOLN: INTRAVENOUS | Status: DC | PRN

## 2023-01-25 MED ORDER — ACETAMINOPHEN 10 MG/ML IV SOLN: 1000.0000 mg | Freq: Once | INTRAVENOUS | Status: DC | PRN

## 2023-01-25 MED ORDER — PHENYLEPHRINE HCL-NACL 20-0.9 MG/250ML-% IV SOLN
INTRAVENOUS | Status: DC | PRN
  Administered 2023-01-25: 50 ug/min via INTRAVENOUS

## 2023-01-25 MED ORDER — PROPOFOL 10 MG/ML IV BOLUS
INTRAVENOUS | Status: AC
  Filled 2023-01-25: qty 20

## 2023-01-25 MED ORDER — POTASSIUM CHLORIDE 10 MEQ/100ML IV SOLN
INTRAVENOUS | Status: AC
  Filled 2023-01-25: qty 100

## 2023-01-25 MED ORDER — SODIUM CHLORIDE 0.9 % IV SOLN
INTRAVENOUS | Status: DC | PRN
  Administered 2023-01-25: 25 mL

## 2023-01-25 MED ORDER — FENTANYL CITRATE (PF) 100 MCG/2ML IJ SOLN
25.0000 ug | INTRAMUSCULAR | Status: DC | PRN
  Administered 2023-01-25 (×2): 25 ug via INTRAVENOUS

## 2023-01-25 MED ORDER — BUPIVACAINE-EPINEPHRINE 0.25% -1:200000 IJ SOLN
INTRAMUSCULAR | Status: DC | PRN
  Administered 2023-01-25: 30 mL

## 2023-01-25 MED ORDER — THIAMINE HCL 100 MG/ML IJ SOLN
500.0000 mg | Freq: Three times a day (TID) | INTRAVENOUS | Status: DC
  Administered 2023-01-26: 500 mg via INTRAVENOUS
  Filled 2023-01-25 (×3): qty 5

## 2023-01-25 MED ORDER — FENTANYL CITRATE (PF) 250 MCG/5ML IJ SOLN
INTRAMUSCULAR | Status: DC | PRN
  Administered 2023-01-25: 150 ug via INTRAVENOUS
  Administered 2023-01-25: 100 ug via INTRAVENOUS

## 2023-01-25 MED ORDER — CALCIUM CHLORIDE 10 % IV SOLN
INTRAVENOUS | Status: DC | PRN
Start: 2023-01-25 — End: 2023-01-25
  Administered 2023-01-25: 400 mg via INTRAVENOUS

## 2023-01-25 MED ORDER — MIRTAZAPINE 15 MG PO TBDP: 7.5000 mg | ORAL_TABLET | Freq: Every day | ORAL | Status: DC

## 2023-01-25 MED ORDER — ROCURONIUM BROMIDE 10 MG/ML (PF) SYRINGE
PREFILLED_SYRINGE | INTRAVENOUS | Status: DC | PRN
  Administered 2023-01-25: 40 mg via INTRAVENOUS

## 2023-01-25 MED ORDER — ESMOLOL HCL 100 MG/10ML IV SOLN
INTRAVENOUS | Status: DC | PRN
  Administered 2023-01-25: 30 mg via INTRAVENOUS

## 2023-01-25 MED ORDER — VASOPRESSIN 20 UNIT/ML IV SOLN
INTRAVENOUS | Status: AC
  Filled 2023-01-25: qty 1

## 2023-01-25 MED ORDER — LIDOCAINE 2% (20 MG/ML) 5 ML SYRINGE
INTRAMUSCULAR | Status: AC
  Filled 2023-01-25: qty 5

## 2023-01-25 MED ORDER — DEXTROSE 50 % IV SOLN
25.0000 mL | Freq: Once | INTRAVENOUS | Status: AC
  Administered 2023-01-25: 25 mL via INTRAVENOUS

## 2023-01-25 MED ORDER — CHLORHEXIDINE GLUCONATE 0.12 % MT SOLN
OROMUCOSAL | Status: AC
  Administered 2023-01-25: 15 mL
  Filled 2023-01-25: qty 15

## 2023-01-25 MED ORDER — ROCURONIUM BROMIDE 10 MG/ML (PF) SYRINGE
PREFILLED_SYRINGE | INTRAVENOUS | Status: AC
  Filled 2023-01-25: qty 10

## 2023-01-25 MED ORDER — PHENYLEPHRINE 80 MCG/ML (10ML) SYRINGE FOR IV PUSH (FOR BLOOD PRESSURE SUPPORT)
PREFILLED_SYRINGE | INTRAVENOUS | Status: DC | PRN
  Administered 2023-01-25: 80 ug via INTRAVENOUS
  Administered 2023-01-25: 160 ug via INTRAVENOUS

## 2023-01-25 MED ORDER — OSMOLITE 1.5 CAL PO LIQD
1000.0000 mL | ORAL | Status: DC
  Administered 2023-01-26: 10 mL

## 2023-01-25 SURGICAL SUPPLY — 71 items
ADH SKN CLS APL DERMABOND .7 (GAUZE/BANDAGES/DRESSINGS) ×2
APL PRP STRL LF DISP 70% ISPRP (MISCELLANEOUS) ×2
APPLIER CLIP 5 13 M/L LIGAMAX5 (MISCELLANEOUS) ×2
APPLIER CLIP ROT 10 11.4 M/L (STAPLE) ×2
APR CLP MED LRG 11.4X10 (STAPLE) ×2
APR CLP MED LRG 5 ANG JAW (MISCELLANEOUS) ×2
BAG COUNTER SPONGE SURGICOUNT (BAG) ×2 IMPLANT
BAG DRN RND TRDRP ANRFLXCHMBR (UROLOGICAL SUPPLIES)
BAG SPEC RTRVL 10 TROC 200 (ENDOMECHANICALS) ×2
BAG SPNG CNTER NS LX DISP (BAG) ×2
BAG URINE DRAIN 2000ML AR STRL (UROLOGICAL SUPPLIES) IMPLANT
BLADE CLIPPER SURG (BLADE) IMPLANT
CANISTER SUCT 3000ML PPV (MISCELLANEOUS) ×2 IMPLANT
CATH FOLEY 2WAY 5CC 20FR (CATHETERS) IMPLANT
CATH URETL OPEN END 6FR 70 (CATHETERS) ×2 IMPLANT
CHLORAPREP W/TINT 26 (MISCELLANEOUS) ×2 IMPLANT
CLIP APPLIE 5 13 M/L LIGAMAX5 (MISCELLANEOUS) IMPLANT
CLIP APPLIE ROT 10 11.4 M/L (STAPLE) ×2 IMPLANT
COVER MAYO STAND STRL (DRAPES) IMPLANT
COVER SURGICAL LIGHT HANDLE (MISCELLANEOUS) ×2 IMPLANT
DERMABOND ADVANCED .7 DNX12 (GAUZE/BANDAGES/DRESSINGS) ×2 IMPLANT
DRAPE C-ARM 42X120 X-RAY (DRAPES) ×2 IMPLANT
DRAPE LAPAROSCOPIC ABDOMINAL (DRAPES) ×2 IMPLANT
ELECT REM PT RETURN 9FT ADLT (ELECTROSURGICAL) ×2
ELECTRODE REM PT RTRN 9FT ADLT (ELECTROSURGICAL) ×2 IMPLANT
ENDOLOOP SUT PDS II 0 18 (SUTURE) IMPLANT
GLOVE BIO SURGEON STRL SZ7.5 (GLOVE) ×2 IMPLANT
GLOVE BIO SURGEON STRL SZ8 (GLOVE) ×2 IMPLANT
GLOVE BIOGEL PI IND STRL 8 (GLOVE) ×2 IMPLANT
GOWN STRL REUS W/ TWL LRG LVL3 (GOWN DISPOSABLE) ×6 IMPLANT
GOWN STRL REUS W/ TWL XL LVL3 (GOWN DISPOSABLE) ×2 IMPLANT
GOWN STRL REUS W/TWL LRG LVL3 (GOWN DISPOSABLE) ×6
GOWN STRL REUS W/TWL XL LVL3 (GOWN DISPOSABLE) ×2
GRASPER SUT TROCAR 14GX15 (MISCELLANEOUS) ×2 IMPLANT
IRRIG SUCT STRYKERFLOW 2 WTIP (MISCELLANEOUS) ×2
IRRIGATION SUCT STRKRFLW 2 WTP (MISCELLANEOUS) ×2 IMPLANT
KIT BASIN OR (CUSTOM PROCEDURE TRAY) ×2 IMPLANT
KIT IMAGING PINPOINTPAQ (MISCELLANEOUS) IMPLANT
KIT TURNOVER KIT B (KITS) ×2 IMPLANT
NDL 22X1.5 STRL (OR ONLY) (MISCELLANEOUS) ×2 IMPLANT
NDL INSUFFLATION 14GA 120MM (NEEDLE) ×2 IMPLANT
NEEDLE 22X1.5 STRL (OR ONLY) (MISCELLANEOUS) ×2 IMPLANT
NEEDLE INSUFFLATION 14GA 120MM (NEEDLE) ×2 IMPLANT
NS IRRIG 1000ML POUR BTL (IV SOLUTION) ×2 IMPLANT
PAD ARMBOARD 7.5X6 YLW CONV (MISCELLANEOUS) ×4 IMPLANT
PLUG CATH AND CAP STRL 200 (CATHETERS) IMPLANT
POUCH RETRIEVAL ECOSAC 10 (ENDOMECHANICALS) ×2 IMPLANT
SCISSORS LAP 5X35 DISP (ENDOMECHANICALS) ×2 IMPLANT
SET CHOLANGIOGRAPH 5 50 .035 (SET/KITS/TRAYS/PACK) IMPLANT
SET TUBE SMOKE EVAC HIGH FLOW (TUBING) ×2 IMPLANT
SLEEVE ADV FIXATION 5X100MM (TROCAR) ×4 IMPLANT
SLEEVE Z-THREAD 5X100MM (TROCAR) ×4 IMPLANT
SPECIMEN JAR SMALL (MISCELLANEOUS) ×2 IMPLANT
SPIKE FLUID TRANSFER (MISCELLANEOUS) ×2 IMPLANT
SPONGE DRAIN TRACH 4X4 STRL 2S (GAUZE/BANDAGES/DRESSINGS) IMPLANT
STOPCOCK 4 WAY LG BORE MALE ST (IV SETS) ×2 IMPLANT
SUT ETHILON 2 0 FS 18 (SUTURE) ×2 IMPLANT
SUT MNCRL AB 4-0 PS2 18 (SUTURE) ×2 IMPLANT
SUT SILK 0 SH 30 (SUTURE) IMPLANT
SUT VIC AB 4-0 PS2 27 (SUTURE) ×2 IMPLANT
SUT VICRYL 0 ENDOLOOP (SUTURE) IMPLANT
TOWEL GREEN STERILE (TOWEL DISPOSABLE) ×2 IMPLANT
TOWEL GREEN STERILE FF (TOWEL DISPOSABLE) ×2 IMPLANT
TRAY LAPAROSCOPIC MC (CUSTOM PROCEDURE TRAY) ×2 IMPLANT
TROCAR 11X100 Z THREAD (TROCAR) ×2 IMPLANT
TROCAR BALLN 12MMX100 BLUNT (TROCAR) ×2 IMPLANT
TROCAR Z-THREAD FIOS 5X100MM (TROCAR) IMPLANT
TROCAR Z-THREAD OPTICAL 5X100M (TROCAR) ×2 IMPLANT
TUBE GASTRO BOLUS 16FR ENFIT (TUBING) IMPLANT
WARMER LAPAROSCOPE (MISCELLANEOUS) ×2 IMPLANT
WATER STERILE IRR 1000ML POUR (IV SOLUTION) ×2 IMPLANT

## 2023-01-25 NOTE — Progress Notes (Signed)
Day of Surgery  Subjective: Anticipating surgery  Objective: Vital signs in last 24 hours: Temp:  [97.6 F (36.4 C)-98.3 F (36.8 C)] 98.3 F (36.8 C) (10/01 0830) Pulse Rate:  [90-109] 98 (10/01 0830) Resp:  [17-18] 17 (10/01 0830) BP: (107-140)/(62-70) 129/62 (10/01 0830) SpO2:  [95 %-100 %] 100 % (10/01 0830) FiO2 (%):  [21 %] 21 % (09/30 2134) Weight:  [107.9 kg] 107.9 kg (10/01 0515) Last BM Date : 01/23/23  Intake/Output from previous day: 09/30 0701 - 10/01 0700 In: 1461.8 [P.O.:200; I.V.:1261.8] Out: 400 [Urine:400] Intake/Output this shift: No intake/output data recorded.  PE: Gen:  Alert, NAD, pleasant Abd: Soft, ND, NT, +BS, incisions well healed  Lab Results:  Recent Labs    01/23/23 0402 01/25/23 0308  WBC 5.5 6.4  HGB 8.6* 8.5*  HCT 26.7* 26.6*  PLT 310 291   BMET Recent Labs    01/24/23 0504 01/25/23 0308  NA 139 135  K 3.8 3.6  CL 117* 113*  CO2 18* 19*  GLUCOSE 76 83  BUN 12 9  CREATININE 1.09* 0.81  CALCIUM 8.1* 7.9*   PT/INR No results for input(s): "LABPROT", "INR" in the last 72 hours. CMP     Component Value Date/Time   NA 135 01/25/2023 0308   K 3.6 01/25/2023 0308   CL 113 (H) 01/25/2023 0308   CO2 19 (L) 01/25/2023 0308   GLUCOSE 83 01/25/2023 0308   BUN 9 01/25/2023 0308   CREATININE 0.81 01/25/2023 0308   CREATININE 0.91 07/26/2017 1127   CALCIUM 7.9 (L) 01/25/2023 0308   PROT 4.7 (L) 01/25/2023 0308   ALBUMIN 1.8 (L) 01/25/2023 0308   AST 18 01/25/2023 0308   AST 15 07/26/2017 1127   ALT 15 01/25/2023 0308   ALT 14 07/26/2017 1127   ALKPHOS 84 01/25/2023 0308   BILITOT 0.5 01/25/2023 0308   BILITOT 0.5 07/26/2017 1127   GFRNONAA >60 01/25/2023 0308   GFRNONAA >60 07/26/2017 1127   GFRAA >60 02/28/2019 1022   GFRAA >60 07/26/2017 1127   Lipase     Component Value Date/Time   LIPASE 22 11/08/2022 2335    Studies/Results: No results found.  Anti-infectives: Anti-infectives (From admission,  onward)    Start     Dose/Rate Route Frequency Ordered Stop   01/25/23 1309  [MAR Hold]  ceFAZolin (ANCEF) IVPB 2g/100 mL premix        (MAR Hold since Tue 01/25/2023 at 1617.Hold Reason: Transfer to a Procedural area)   2 g 200 mL/hr over 30 Minutes Intravenous 30 min pre-op 01/25/23 1309     01/20/23 1430  meropenem (MERREM) 1 g in sodium chloride 0.9 % 100 mL IVPB  Status:  Discontinued        1 g 200 mL/hr over 30 Minutes Intravenous Every 8 hours 01/20/23 1338 01/24/23 0801   01/19/23 1700  meropenem (MERREM) 1 g in sodium chloride 0.9 % 100 mL IVPB  Status:  Discontinued        1 g 200 mL/hr over 30 Minutes Intravenous Every 12 hours 01/19/23 1345 01/20/23 1338   01/17/23 1515  meropenem (MERREM) 1 g in sodium chloride 0.9 % 100 mL IVPB  Status:  Discontinued        1 g 200 mL/hr over 30 Minutes Intravenous Every 8 hours 01/17/23 1428 01/19/23 1345   01/16/23 1400  piperacillin-tazobactam (ZOSYN) IVPB 3.375 g  Status:  Discontinued        3.375 g 12.5 mL/hr  over 240 Minutes Intravenous Every 8 hours 01/16/23 1322 01/17/23 1428   01/14/23 1000  cefTRIAXone (ROCEPHIN) 1 g in sodium chloride 0.9 % 100 mL IVPB  Status:  Discontinued        1 g 200 mL/hr over 30 Minutes Intravenous Every 24 hours 01/14/23 0925 01/16/23 1322   01/09/23 0930  cefTRIAXone (ROCEPHIN) 1 g in sodium chloride 0.9 % 100 mL IVPB        1 g 200 mL/hr over 30 Minutes Intravenous Every 24 hours 01/09/23 0840 01/11/23 1253      CT 9/14 - Distended gallbladder. If there is concern of gallbladder pathology ultrasound may be useful as the next step in the workup. - Grossly stable left-sided renal cystic foci. - Surgical changes of previous gastric bypass - Markedly limited examination due to the level of motion. No obvious obstruction, free air or free fluid. In addition, a repeat study could be considered when the patient is more clinically able.  RUQ [place] 9/15 1. Hepatic fatty infiltration. 2. Gallbladder  distended. 3. Minimal gallbladder sludge. 4. Echogenic kidneys consistent with chronic medical renal disease. 5. Left kidney cyst. 6. There was limited visualization, as described.  Esophagram 9/20  1. 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 Further evaluation with endoscopy is recommended. 2. Significant delay in swallow initiation/mechanism. Prominent cricopharyngeal bar. 3. Advanced esophageal dysmotility with spasms. 4. Limited study due to patient's body habitus and mobility limitations.   CT chest 9/21 1. No acute cardiopulmonary process (no obvious extrinsic esophageal mass) 2. Minimal atelectasis in the left lower lobe.  HIDA 9/23 1.  Patent cystic and common bile ducts. 2. Elevated gallbladder ejection fraction as can be seen with gallbladder hyperkinesis.   Assessment/Plan Christine [name] is a 72 y.o. female with hx of robotic gastric bypass on 09/14/2022 by Dr. Dossie Der on 09/14/22 who is here with FTT - Unsure etiology of mood and mentation issues postoperatively, may have been due to thiamine deficiency, change in antidepressant absorption after gastric bypass, UTI?  Whatever the cause, I think this was also the cause of her inability to eat - She didn't eat anything all weekend and struggled with hypoglycemia - Discussed with patient, brother (in hall) and sister (over the phone).  Family agrees that she is refusing to eat due to mental health issues, and understand there is no clear organic cause of her failure to thrive.    - At this point I recommend laparoscopic remnant gastrostomy tube placement, laparoscopic cholecystectomy with intra-operative cholangiogram, and upper endoscopy.  She has gallstones and removing her gallbladder will eliminate that as a cause of her failure to thrive.  Upper endoscopy will help rule out ulcer or other cause of her failure to thrive.  A  feeding tube will give [place] enteral access to address her nutrition.  I discussed this with the patient, brother (in hall) and sister (over the phone) who all voiced agreement.  - Again reviewed risks, benefits and alternatives and patient granted consent to proceed.  Will proceed as scheduled. - Patient will need safe discharge plan - Patient will need continued psychiatric care postoperatively as she is demonstrating self harm by refusing to eat.  Will re-consult psychiatric team to help direct this care.  If psychiatric team does not feel she needs inpatient care, she will need connected to outpatient psychiatric support.     LOS: 17 days   Quentin Ore, MD [facility]  01/25/2023, 4:22 PM Please see Amion for pager number during day hours 7:00am-4:30pm

## 2023-01-25 NOTE — Progress Notes (Signed)
Nutrition Follow-up  DOCUMENTATION CODES:   Not applicable  INTERVENTION:  Discontinue calorie count Once diet resumes, continue Glucerna Shake po TID, each supplement provides 220 kcal and 10 grams of protein Carnation Breakfast Essentials BID, each packet mixed with 8 ounces of 2% milk provides 13 grams of protein and 260 calories. Continue bariatric vitamin regimen: MVI BID and TUMS TID Once PEG tube ready for use, recommend: Starting Osmolite 1.5 at 20 ml/h and advance by 10ml q10h to a goal rate of 65ml/hr (960 ml per day) Prosource TF20 60 ml once daily  Free water flushes q4h  Provides 1520 kcal, 80 gm protein, 1332 ml free water daily Monitor magnesium, potassium, and phosphorus BID for at least 3 days, MD to replete as needed, as pt is at risk for refeeding syndrome given inadequate PO intake >7 days  Once tolerating tube feeding at goal, can transition to bolus regimen for home: 1 carton ( ) Osmolite 1.5 five times daily 30ml water flushes before and after each bolus ( daily) Provides 1775 kcal, 75g protein, total free water daily  NUTRITION DIAGNOSIS:   Increased nutrient needs related to wound healing as evidenced by estimated needs. - remains applicable  GOAL:   Patient will meet greater than or equal to 90% of their needs - goal unmet  MONITOR:   PO intake, Supplement acceptance  REASON FOR ASSESSMENT:   Consult Assessment of nutrition requirement/status, Calorie Count  ASSESSMENT:   72 yo female admitted with severe dehydration, AKI. 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  Unfortunately there are limited meal tickets to retrieve from calorie count over the weekend. Of the meal tickets, pt's po intake was very minimal. Pt ate 10% of humus with pita and vegetables on 9/27 for lunch. On 9/30, pt noted to refuse most po intake. For lunch she consumed 25% of a deli sandwich and 10% of banana  pudding (67 kcal and 4g protein). Poor po intake also evidenced by recurrent episodes of hypoglycemia.   Spoke with pt at bedside. She denies eating over the weekend as she did not feel up to it.  PMT continues to follow for GOC. Pt is NPO for PEG tube placement today + cholecystectomy. Discussed with her that this does not limit her from eating by mouth if desired. Once diet resumes following procedure, encouraged pt to resume protein supplements.    Medications: TUMS TID, Vitamin D3, ferrous sulfate, folic acid, megace, MVI, protonix, thiamine  Labs: anion gap 3, CBG's 30-128 x24 hours  Diet Order:   Diet Order             Diet NPO time specified  Diet effective midnight                   EDUCATION NEEDS:   Education needs have been addressed  Skin:  Skin Assessment: Skin Integrity Issues: Skin Integrity Issues:: Stage II, Stage III Stage II: coccyx, 8 areas to L & R buttocks, L thigh Stage III: 2 areas to R buttocks  Last BM:  10/1 (type 7 small)  Height:   Ht Readings from Last 1 Encounters:  01/08/23 5\' 1"  (1.549 m)    Weight:   Wt Readings from Last 1 Encounters:  01/25/23 107.9 kg    Ideal Body Weight:  47.7 kg  BMI:  Body mass index is 44.95 kg/m.  Estimated Nutritional Needs:   Kcal:  1500-1700  Protein:  75-90g  Fluid:  1.5-2 L  Drusilla Kanner, RDN, LDN Clinical Nutrition

## 2023-01-25 NOTE — Op Note (Signed)
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  Complications: None  Drains:  none   Specimen:  ID Type Source Tests Collected by Time Destination  1 : Gallbladder Tissue PATH Gallbladder SURGICAL PATHOLOGY Getsemani Lindon, Hyman Hopes, MD 01/25/2023 1752      Disposition:  PACU - hemodynamically stable.  Plan of Care:  Continued inpatient care    Indications for Procedure: Christine [name] is a 72 y.o. female who presented with failure to thrive after gastric bypass and refusal to eat.  Workup showed no medical explanation for her symptoms, and behavioral issues/mental health issues were concerned.  She was found to have gallstones, though her symptoms do not seem necessarily consistent with biliary colic, I counseled her that it may be beneficial to remove her gallbladder and remove that as a possibility for her inability to eat.  I recommended laparoscopic cholecystectomy with intraoperative cholangiogram for her gallstones, laparoscopic gastrostomy tube placement for her failure to thrive, and upper endoscopy as a final diagnostic effort to look for an explanation for her symptoms.  The procedure itself, as well as the risks, benefits and alternatives were discussed with the patient.  Risks discussed included but were not limited to the risk of infection, bleeding,  damage to nearby structures, need to convert to open procedure, incisional hernia, bile leak, common bile duct injury and the need for additional procedures or surgeries.  I also discussed the situation, my recommendations, and the risks with the patient sister and brother who voiced understanding and agreed with proceeding.  With this discussion complete and all questions answered the patient granted consent to proceed.  Findings: Normal upper endoscopy status post gastric bypass.  Distended gallbladder.  Normal foregut anatomy with laparoscopic gastrostomy tube placement.  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  Infection status: Patient: Private Patient Elective Case Case: Urgent Infection Present At Time Of Surgery (PATOS):  Some spillage of bile during the cholecystectomy.  Some spillage of gastric contents during the G-tube placement.   Description of Procedure:   On the date stated above, the patient was taken to the operating room suite and placed in supine positioning.  Sequential compression devices were placed on the lower extremities to prevent blood clots.  General endotracheal anesthesia was induced. Preoperative antibiotics were given.  The patient's abdomen was prepped and draped in the usual sterile fashion.  A time-out was completed verifying the correct patient, procedure, positioning and equipment needed for the case.  We began by anesthetizing the skin with local anesthetic and then making a 5 mm incision just below the umbilicus.  We dissected through the subcutaneous tissues to the fascia.  The fascia was grasped and elevated using a Kocher clamp.  A Veress needle was inserted into the abdomen and the abdomen was insufflated to 15 mmHg.  A 5 mm trocar was inserted in this position under optical guidance and then the abdomen was inspected.  There was no trauma to the underlying viscera with initial  trocar placement.  Any abnormal findings, other than inflammation  in the right upper quadrant, are listed above in the findings section.  Three additional trocars were placed, one 12 mm trocar in the subxiphoid position, one 5 mm trocar in the midline epigastric area and one 5mm trocar in the right upper quadrant subcostally.  These were placed under direct vision without any trauma to the underlying viscera.    The patient was then placed in head up, left side down positioning.  The gallbladder was identified and dissected free from its attachments to the omentum allowing the duodenum to fall away.  The infundibulum of the gallbladder was dissected free working laterally to medially.  The cystic duct and cystic artery were dissected free from surrounding connective tissue.  The infundibulum of the gallbladder was dissected off the cystic plate.  A critical view of safety was obtained with the cystic duct and cystic artery being cleared of connective tissues and clearly the only two structures entering into the gallbladder with the liver clearly visible behind.  One clip was applied high on the cystic duct.  A small ductotomy was created below this using the endoscopic shears.  A cholangiogram catheter was introduced through the abdominal wall and into the cystic duct through this ductotomy.  The catheter was clipped into position.  The catheter was flushed to ensure no leakage around the clip.  We then removed the laparoscopic instruments and positioned the C-Arm to perform a cholangiogram.  The catheter was flushed with contrast under fluoroscopic visualization and a cholangiogram was obtained.  The cholangiogram visualized the biliary tree from the ampulla up to the first two biliary radicals in the liver.  There were no filling defects identified.  The catheter clearly entered the cystic duct.  There was gradual tapering of the common bile duct down to the ampulla without evidence of stricture or other abnormalities.  Please see the EMR for saved representative images.  With  our cholangiogram compete, we moved the c-arm away from the field and returned to laparoscopic surgery.    Clips were then applied to the cystic duct and cystic artery and then these structures were divided.  A PDS endoloop was placed to secure the cystic duct.  The gallbladder was dissected off the cystic plate, placed in an endocatch bag and removed from the 12 mm subxiphoid port site.  The clips were inspected and appeared effective.  The cystic plate was inspected and hemostasis was obtained using electrocautery.  A suction irrigator was used to clean the operative field.    I then directed my attention to the gastrostomy tube placement.  A spot was located on the abdominal wall inferior to the rib cage prior to insufflation.  I made 4 stab incisions superior, inferior, to the right and to the left of this location for transfascial silk suture placement.  I made a 5 mm incision at the site of tube placement for a 5 mm trocar to be placed and eventually gastrostomy tube to be placed.  I put a pursestring suture on the distal stomach just to the medial aspect or right side of the Roux limb.  A gastrotomy was made and a 37 French gastrostomy tube was inserted into the stomach in this position.  The balloon was inflated after checking for a leak.  4 transfascial 0 silk sutures were placed through the previous incisions taking bites of the gastric wall intra-abdominal he and using the PMI to pull the suture through the abdominal  wall.  Attention was turned to closure.  The 12 mm subxiphoid port site was closed using a 0-vicryl suture on a fascial suture passer.  The abdomen was desufflated.  The skin was closed using 4-0 monocryl and dermabond.  All sponge and needle counts were correct at the conclusion of the case.  At the conclusion of the laparoscopic procedure, I obtained the adult upper endoscope and inserted down the patient's mouth, through the esophagus, into the gastric pouch, and into the Roux  limb.  The gastrojejunostomy appeared dilated but widely patent without any evidence of ulceration.  The examined mucosa appeared normal.  I was unable to take photos due to the limited equipment in the operating room.  The endoscope was removed and the patient was woken from anesthesia and transferred the postanesthesia care unit in stable condition.      Ivar Drape, MD General, Bariatric, & Minimally Invasive Surgery [facility], [place]

## 2023-01-25 NOTE — Anesthesia Preprocedure Evaluation (Addendum)
Anesthesia Evaluation  Patient identified by MRN, date of birth, ID band Patient awake    Reviewed: Allergy & Precautions, NPO status , Patient's Chart, lab work & pertinent test results  Airway Mallampati: III  TM Distance: >3 FB Neck ROM: Full    Dental  (+) Chipped,    Pulmonary sleep apnea and Continuous Positive Airway Pressure Ventilation , former smoker   Pulmonary exam normal        Cardiovascular hypertension, Pt. 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 Gallstones  Reproductive/Obstetrics                             Anesthesia Physical Anesthesia Plan  ASA: 3  Anesthesia Plan: General   Post-op Pain Management:    Induction: Intravenous  PONV Risk Score and Plan: 4 or greater and Ondansetron, Dexamethasone, Propofol infusion and Treatment may vary due to age or medical condition  Airway Management Planned: Oral ETT  Additional Equipment:   Intra-op Plan:   Post-operative Plan: Extubation in OR  Informed Consent: I have reviewed the patients History and Physical, chart, labs and discussed the procedure including the risks, benefits and alternatives for the proposed anesthesia with the patient or authorized representative who has indicated his/her understanding and acceptance.   Patient has DNR.  Discussed DNR with patient and Suspend DNR.   Dental advisory given  Plan Discussed with: CRNA  Anesthesia Plan Comments:         Anesthesia Quick Evaluation

## 2023-01-25 NOTE — Progress Notes (Signed)
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 Dossie Der.  She has been progressively declining since surgery.  She has had multiple hospitalizations and ED visits in past several months.  Recently seen at [facility] on 01/03/23 with complaints of weakness and poor oral intake.  She was hospitalized in July and August of this year at different facilities for dehydration, weakness, acute renal failure and failure to thrive.  She has been residing in a SNF.  She apparently has not been eating or drinking and refusing to take her medications.  She was sent to ER today for evaluation of altered mentation.  She has had 3 days of progressive weakness and refusing oral intake.  She complains of pain all over body which is not new per record review. Pt is a poor historian.   She was found to be severely dehydrated with hypoglycemia and hypernatremic with a sodium of 157.  She also had acute kidney injury and distended gallbladder on CT scan.  CT head with no acute findings.  Pt was started on IV hydration and admission was requested.   OT comments  Pt in bed upon therapy arrival and agreeable to participate in therapy session. Pt reports that she is waiting to be assisted with peri care d/t incontinence. OT assisted with peri care while session focused on bed mobility, sit to stand transitions, and ADL re-training. Pt demonstrated hesitation with sit to stand transition although with use of Stedy was able to transfer to [facility] and then recliner with OT. Pt requested removal of purewick and nursing was made aware after session. Plan for Peg tube placement this afternoon. Pt continues to be appropriate for continued inpatient  follow up therapy, <3 hours/day. OT will continue to follow patient acutely.        If plan is discharge home, recommend the following:  A lot of help with walking and/or transfers;A lot of help with bathing/dressing/bathroom;Assistance with cooking/housework;Assist for transportation;Help with stairs or ramp for entrance   Equipment Recommendations  Other (comment) (defer to next venue of care)       Precautions / Restrictions Precautions Precautions: Fall Restrictions Weight Bearing Restrictions: No       Mobility Bed Mobility Overal bed mobility: Needs Assistance Bed Mobility: Rolling, Sidelying to Sit Rolling: Min assist, Used rails Sidelying to sit: Max assist, HOB elevated, Used rails       General bed mobility comments: VC and tactile cues provided for rolling technique. When pt was sidelying, utilized bed features to elevate HOB to assist with sitting upright on EOB. Pt cues to use bed rails and foot board as needed. Total A to scoot left hip towards EOB using bed pad.    Transfers Overall transfer level: Needs assistance Equipment used: Ambulation equipment used Transfers: Sit to/from Stand, Bed to chair/wheelchair/BSC Sit to Stand: Max assist, Via lift equipment, From elevated surface           General transfer comment: Pt initially asked to use RW to complete sit to stand. Pt was unable to fully commit to attempting sit to stand (ie. she would count 1,2,3, then not attempt to stand). Stedy brought in room to help with confidence level. Utilized counting and rocking motion to help initiate sit to stand with  VC provided to pull chest towards bar and squeeze glutes to stand upright. Pt was able to maintain standing posture while in stedy with SBA. Transfer via Lift Equipment: Stedy   Balance Overall balance assessment: Needs assistance Sitting-balance support: Feet supported, No upper extremity supported Sitting balance-Leahy Scale: Fair Sitting balance -  Comments: sitting EOB   Standing balance support: Bilateral upper extremity supported, Reliant on assistive device for balance Standing balance-Leahy Scale: Poor Standing balance comment: reliant on Stedy to maintain balance.              ADL either performed or assessed with clinical judgement   ADL Overall ADL's : Needs assistance/impaired     Grooming: Oral care;Supervision/safety;Sitting        Toilet Transfer: Total assistance;Cueing for sequencing;BSC/3in1 Toilet Transfer Details (indicate cue type and reason): Total A with use of Stedy to complete transfer. Max Ax1 to complete sit to stand transition. Toileting- Clothing Manipulation and Hygiene: Total assistance;Sit to/from stand Toileting - Clothing Manipulation Details (indicate cue type and reason): Utilized Stedy to assist with standing balance                       Cognition Arousal: Alert Behavior During Therapy: WFL for tasks assessed/performed Overall Cognitive Status: No family/caregiver present to determine baseline cognitive functioning     General Comments: Pt answered all questions and interacted appropriately with OT during session.              General Comments VSS on RA    Pertinent Vitals/ Pain       Pain Assessment Pain Assessment: Faces Faces Pain Scale: Hurts a little bit Pain Location: generalized stiffness/soreness with mobility Pain Descriptors / Indicators: Grimacing, Discomfort Pain Intervention(s): Monitored during session, Repositioned         Frequency  Min 1X/week        Progress Toward Goals  OT Goals(current goals can now be found in the care plan section)  Progress towards OT goals: Progressing toward goals      AM-PAC OT "6 Clicks" Daily Activity     Outcome Measure   Help from another person eating meals?: None Help from another person taking care of personal grooming?: A Little Help from another person toileting, which includes using toliet,  bedpan, or urinal?: Total Help from another person bathing (including washing, rinsing, drying)?: A Lot Help from another person to put on and taking off regular upper body clothing?: A Lot Help from another person to put on and taking off regular lower body clothing?: Total 6 Click Score: 13    End of Session Equipment Utilized During Treatment: Gait belt  OT Visit Diagnosis: Unsteadiness on feet (R26.81);Other abnormalities of gait and mobility (R26.89);Muscle weakness (generalized) (M62.81);Pain;Other symptoms and signs involving cognitive function   Activity Tolerance Patient tolerated treatment well   Patient Left in chair;with call bell/phone within reach;with chair alarm set   Nurse Communication Mobility status;Need for lift equipment;Other (comment) (removal of purewick per pt's request)        Time: 1610-9604 OT Time Calculation (min): 54 min  Charges: OT General Charges $OT Visit: 1 Visit OT Treatments $Self Care/Home Management : 53-67 mins  Limmie Patricia, OTR/L,CBIS  Supplemental OT - [facility] and [facility] Secure Chat Preferred    Clydia Nieves, Charisse March 01/25/2023, 1:48 PM

## 2023-01-25 NOTE — Consult Note (Signed)
[facility] Psychiatry New Face-to-Face Psychiatric Evaluation   Service Date: January 25, 2023 LOS:  LOS: 17 days    Assessment  Christine [name] is a 72 y.o. female admitted medically for 01/08/2023 11:43 AM for Hypernatremia. 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 Psychiatry was consulted for concern about self harm via refusal to eat by Christine [name].    Her current presentation of lack of appetite and anhedonia is most consistent with MDD. She meets criteria for MDD based on history taken during assessment.  Current outpatient psychotropic medications include cymbalta and historically she has had a favorable response to these medications. She was  compliant with medications prior to admission as evidenced by patient endorsing so, and continue f/u with outpatient PCP documenting this. On initial examination, patient is non chalant about not eating, but endorses that she knows her decreased appetite is unhealthy. Patient endorses the last 4 months (since Roux- en Y) she has been feeling "lost" and endorses decreased concentration, decrease appetite, and anhedonia. Patient also endorses isolating behaviors and psychomotor retardation. Patient's sister also endorsed patient has been complaining about decreased sleep more the last few months as well.  Patient has 2 psychotherapy visits in 2024 in her chart with dx of MDD; however unsure if these were related to patient's procedure as she did go for a psych eval prior to her surgery. Patient meets criteria for MDD, will continue to flesh out if this has a specifier of psychosis. At this time patient is not apparently endorsing delusions or hallucinations, but does appear to have odd behaviors such as compulsively buying food but then not eating it.    There is some concern that patient may be confabulating as she appears to make up answers, to things she does not know but recognizes she should. Other times patient will become irritated and reports she wants to be left alone if she cannot answer a few questions. While dementia is on the differential it is lower than MDD or AMS 2/2 medical issues as patient's symptoms appear to have started after surgery and it is known that patient nutritional intake is far below where it should be. Despite patient having low normal thiamine at last measure (9/22) patient may still be symptomatic.    Please see plan below for detailed recommendations.   Diagnoses:  Active Hospital problems: Principal Problem:   Hypernatremia Active Problems:   Gastro-esophageal reflux disease with esophagitis   Essential hypertension   Osteoarthrosis   OSA on CPAP   GAD (generalized anxiety disorder)   Mixed incontinence urge and stress   Morbid obesity (HCC)   Abdominal pain   Gout   Gastroesophageal reflux disease   Prediabetes   Memory difficulties   Hyperlipidemia   Acute metabolic encephalopathy   Severe dehydration   Altered mental status   Failure to thrive in adult   AKI (acute kidney injury) (HCC)   Gallbladder dilatation   Esophageal dysphagia   Abnormal esophagram   Hypoglycemia     Plan  ## Safety and Observation Level:  - Based on my clinical evaluation, I estimate the patient to be at low risk of self harm in the current setting - At this time, we recommend a routine level of observation. This decision is based on my review of the chart  including patient's history and current presentation, interview of the patient, mental status examination, and consideration of suicide risk including evaluating suicidal ideation, plan, intent, suicidal or self-harm behaviors, risk factors, and protective factors. This judgment is based on our ability to directly address suicide risk, implement suicide  prevention strategies and develop a safety plan while the patient is in the clinical setting. Please contact our team if there is a concern that risk level has changed.   ## Medications:  MDD Failure to thrive Will start Remeron 7.5qhs tom night, given possible hx of delirium in the hospital per sister. It would be best to minimize sedating medications after patient's late afternoon surgery today.  -- Continue Cymbalta 60mg  daily -- start Remeron 7.5mg  at bedtime (tom) - Start high dose thiamine protocol : thaimine 500mg  IV q8h for 3 days - D'c PO thiamine 100mg  daily for the next 3 days   ## Medical Decision Making Capacity:  Not formally assessed  ## Further Work-up:  -- Per Primary and surgery team    -- most recent EKG on 01/16/2023 had QtC of 419 w/ HR 106 -- Pertinent labwork reviewed earlier this admission includes:     Latest Ref Rng & Units 01/25/2023    3:08 AM 01/24/2023    5:04 AM 01/23/2023    4:02 AM  BMP  Glucose 70 - 99 mg/dL 83  76  80   BUN 8 - 23 mg/dL 9  12  13    Creatinine 0.44 - 1.00 mg/dL 1.61  0.96  0.45   Sodium 135 - 145 mmol/L 135  139  138   Potassium 3.5 - 5.1 mmol/L 3.6  3.8  3.6   Chloride 98 - 111 mmol/L 113  117  114   CO2 22 - 32 mmol/L 19  18  20    Calcium 8.9 - 10.3 mg/dL 7.9  8.1  8.0    --Thiamine 01/16/2023- 71.2  ## Disposition:  -- remains on floor, follow medical team dispo  ## Behavioral / Environmental:  -- Routine DELIRIUM RECS 1: Avoid benzodiazepines, antihistamines, anticholinergics, and minimize opiate use as these may worsen delirium. 2:Assess, prevent and manage pain as lack of treatment can result in delirium.  3: Recommend consult to PT/OT if not already done. Early mobility and exercise has been shown to decrease duration of delirium.  4:Provide appropriate lighting and clear signage; a clock and calendar should be easily visible to the patient. 5:Monitor environmental factors. Reduce light and noise at night (close  shades, turn off lights, turn off TV, ect). Correct any alterations in sleep cycle. 6: Reorient the patient to person, place, time and situation on each encounter.  7: Correct sensory deficits if possible (replace eye glasses, hearing aids, ect). 8: Avoid restraints. Severely delirious patients benefit from constant observation by a sitter. 9: Do not leave patient unattended.     ##Legal Status -- Patient permitted provider to call her sister, Christine [name] [phone]  Thank you for this consult request. Recommendations have been communicated to the primary team.  We will continue to follow at this time.   Bobbye Morton, MD   NEW history  Relevant Aspects of Hospital Course:  Assessment  Christine [name] is a 72 y.o. female admitted medically for 01/08/2023 11:43 AM for Hypernatremia. 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 Psychiatry was consulted for concern about self harm via refusal to eat by Christine [name].   Patient Report:  Patient reports that she feels she has been more "lost" since her surgery 4 month ago. Patient reports that she also had very poor focus and concentration. Patient reports that she is not sure why she is not eating and reports that she does not wish to think about why. Patient endorses she is not bothered much by not eating, but does endorse that she knows that it is not good for her health and has decided to get the Feeding tube because this will help her get home faster. Patient denies belief that her food being poisoned and also denies issues with food textures. Patient also denies pain with eating. Patient reports that she enjoys [place] food, but endorses that she will only eat what she likes, but endorses that lately this may only be 1 bit of food. Patient denies SI,  HI, and AVH. Patient does endorse that she has had anhedonia and been isolating more over the last 4 months. Patient reports that she has been isolating more and prior to her surgery was a bit more social, and she would like to get back to this.   Patient endorses that she does not really want to answer a lot of questions, but does endorses that she likes the Cymbalta for anxiety. Patient reports that she believes she has always had anxiety but Cymbalta has helped and lately she has not had any issues with restlessness or worried feelings. Patient does endorse that she has been feeling more irritable over the last 4 months and this has contributed to her isolative behaviors.   Upon discussion with patient about adding mirtazapine to regimen, patient is consenting as long as her sister is ok and made aware of the recommendation.   Attempting to assess for concentration and awareness on second assessment in afternoon: Patient was able to count backwards from 15 to 8 correctly, she was Aox4, aware vaguly of current event. Regarding current events patient was aware that there was "bombing with [place]" but unsure who they were bombing and aware that Vonna Drafts was running for President but could not recall who she was running against. When asked about what she was watching (Paw Patrol), patient gave a name that sounds similar but was incorrect. When asked about her Crab legs, patient recalls ordering them from a grocery store and reports that they were kept in a patient fridge.   Collateral information:   Per sister patient has endorsed not sleeping well at night. Sister reports that the patient may have been having hallucinations over the last few months.Per sister, the patient has reported seeing people from the [employer] and dogs in her room. Patient reports that this has been going on over the last 4 months during different hospital stays. Sister reports that she has been more alert recently, and has not had  any episodes in the last 2 weeks. She does occasional recall the hallucination and will ask her sister if she remembers it from previous hospitalizations.   Sister reports that more recently the patient has been making more plans for activities. She appears to be thinking about the future.   Sister reports that she does think the patient is lonely as their family is mostly in [place], and it is harder to get down to see her. Patient also lives alone. Sister has to keep reminding patient that she is back in [place]  and left [place] 2 weeks ago. But her brother came yesterday.   Sister reports that their brother came in yesterday, and she had ordered crab legs from a grocery store online. She never ate them, the family ended up throwing them away. They are not sure she notified her medical team about her order for crab legs, but they know that she really likes Crab legs. RN later confirmed that 9/29 patient had crab labs and was nibbling on them all day 9/30, before staff was able to convince her to throw them away due to them sitting out. They were never put in a fridge.     Psychiatric History:  Information collected from patient  INPT: denies OPT: denies, but PCP dx with GAD and started Cymbalta 60mg  daily Therapy: denies, but endorses that she did get psychology clearance prior to her surgery   Family psych history: Unknown   Social History:  Lives alone   Family History:   The patient's family history includes Alcohol abuse in her brother; Arthritis in her mother and sister; Asthma in her father and mother; Diabetes in her father, mother, sister, and sister; Drug abuse in her brother; Heart disease in her father and mother; High blood pressure in her father and mother; Varicose Veins in her sister.  Medical History: Past Medical History:  Diagnosis Date   Allergy    dust, pollen, sulfa, prednisone   Anemia    Anxiety    Arthritis    "knees" (04/26/2016)   Colon polyps     benign per pt   Coronary artery disease    mild per 2015 cath in [place] (OM1 30%, RCA 30%)   GERD (gastroesophageal reflux disease)    Gout    History of hiatal hernia    Hyperlipidemia 05/20/2021   Hypertension    Migraine    "none since early /2017" (05/06/2016)   Obesity    OSA on CPAP    uses CPAP   Pre-diabetes    Pre-operative cardiovascular examination 08/22/2008   Renal insufficiency 11/09/2022   Vasculitis (HCC)    Bilateral    Surgical History: Past Surgical History:  Procedure Laterality Date   ABDOMINAL HYSTERECTOMY     "partial"; both ovaries present   APPENDECTOMY  05/06/2016   BIOPSY  04/08/2022   Procedure: BIOPSY;  Surgeon: Sherrilyn Rist, MD;  Location: [facility];  Service: Gastroenterology;;   CARDIAC CATHETERIZATION  ~ 2015   CARDIAC CATHETERIZATION  2015   In [place]   CHILECTOMY Right 06/01/2017   Procedure: CHILECTOMY RIGHT FOOT;  Surgeon: Felecia Shelling, DPM;  Location: [facility];  Service: Podiatry;  Laterality: Right;   ESOPHAGOGASTRODUODENOSCOPY (EGD) WITH PROPOFOL N/A 04/08/2022   Procedure: ESOPHAGOGASTRODUODENOSCOPY (EGD) WITH PROPOFOL;  Surgeon: Sherrilyn Rist, MD;  Location: [facility];  Service: Gastroenterology;  Laterality: N/A;   LAPAROSCOPIC APPENDECTOMY N/A 05/06/2016   Procedure: LAPAROSCOPIC APPENDECTOMY;  Surgeon: Manus Rudd, MD;  Location: [facility];  Service: General;  Laterality: N/A;   POLYPECTOMY  04/08/2022   Procedure: POLYPECTOMY;  Surgeon: Sherrilyn Rist, MD;  Location: [facility];  Service: Gastroenterology;;   TONSILLECTOMY      Medications:   Current Facility-Administered Medications:    acetaminophen (TYLENOL) tablet 650 mg, 650 mg, Oral, Q6H PRN, 650 mg at 01/19/23 1845 **OR** acetaminophen (TYLENOL) suppository 650 mg, 650 mg, Rectal, Q6H PRN, Tat, David, MD, 650 mg at 01/15/23 2215   antiseptic oral rinse (BIOTENE) solution 15 mL, 15 mL, Mouth Rinse,  BID, Catarina Hartshorn, MD, 15 mL at 01/25/23 2841    calcium carbonate (TUMS - dosed in mg elemental calcium) chewable tablet 200 mg of elemental calcium, 200 mg of elemental calcium, Oral, TID, Elvera Lennox, Costin M, MD, 200 mg of elemental calcium at 01/25/23 0829   ceFAZolin (ANCEF) IVPB 2g/100 mL premix, 2 g, Intravenous, 30 min Pre-Op, Stechschulte, Hyman Hopes, MD   Chlorhexidine Gluconate Cloth 2 % PADS 6 each, 6 each, Topical, Daily, Tat, David, MD, 6 each at 01/25/23 3244   cholecalciferol (VITAMIN D3) 25 MCG (1000 UNIT) tablet 5,000 Units, 5,000 Units, Oral, Daily, Mariel Craft, MD, 5,000 Units at 01/25/23 0102   colchicine tablet 0.6 mg, 0.6 mg, Oral, Daily, Tat, David, MD, 0.6 mg at 01/25/23 0830   dextrose 10 % infusion, , Intravenous, Continuous, Leatha Gilding, MD, Last Rate: 75 mL/hr at 01/24/23 2233, New Bag at 01/24/23 2233   diazepam (VALIUM) injection 2.5 mg, 2.5 mg, Intravenous, BID PRN, Tat, Onalee Hua, MD, 2.5 mg at 01/20/23 1505   diclofenac Sodium (VOLTAREN) 1 % topical gel 2 g, 2 g, Topical, QID PRN, Tat, Onalee Hua, MD   DULoxetine (CYMBALTA) DR capsule 60 mg, 60 mg, Oral, Daily, Tat, David, MD, 60 mg at 01/25/23 0829   enoxaparin (LOVENOX) injection 40 mg, 40 mg, Subcutaneous, Q24H, Tat, David, MD, 40 mg at 01/24/23 2020   feeding supplement (GLUCERNA SHAKE) (GLUCERNA SHAKE) liquid 237 mL, 237 mL, Oral, TID BM, Gherghe, Costin M, MD, 237 mL at 01/24/23 1457   fentaNYL (SUBLIMAZE) injection 25 mcg, 25 mcg, Intravenous, Q4H PRN, Tat, Onalee Hua, MD, 25 mcg at 01/23/23 0948   ferrous sulfate tablet 325 mg, 325 mg, Oral, Q breakfast, Tat, David, MD, 325 mg at 01/25/23 7253   folic acid injection 1 mg, 1 mg, Intravenous, Daily, Tat, David, MD, 1 mg at 01/25/23 0831   Gerhardt's butt cream, , Topical, BID, Leatha Gilding, MD, Given at 01/25/23 0831   megestrol (MEGACE) 400 MG/10ML suspension 400 mg, 400 mg, Oral, Daily, Leatha Gilding, MD, 400 mg at 01/25/23 0829   [START ON 01/26/2023] mirtazapine (REMERON SOL-TAB) disintegrating tablet 7.5  mg, 7.5 mg, Oral, QHS, Kripa Foskey B, MD   multivitamin with minerals tablet 1 tablet, 1 tablet, Oral, BID, Leatha Gilding, MD, 1 tablet at 01/25/23 0830   ondansetron (ZOFRAN) tablet 4 mg, 4 mg, Oral, Q6H PRN **OR** ondansetron (ZOFRAN) injection 4 mg, 4 mg, Intravenous, Q6H PRN, Tat, David, MD   Oral care mouth rinse, 15 mL, Mouth Rinse, TID, Ferolito, Albertina Senegal, NP, 15 mL at 01/25/23 1518   oxybutynin (DITROPAN-XL) 24 hr tablet 15 mg, 15 mg, Oral, QHS, Tat, David, MD, 15 mg at 01/24/23 2019   oxyCODONE (Oxy IR/ROXICODONE) immediate release tablet 5 mg, 5 mg, Oral, Q4H PRN, Tat, David, MD, 5 mg at 01/23/23 2126   pantoprazole (PROTONIX) injection 40 mg, 40 mg, Intravenous, Q12H, Tat, Onalee Hua, MD, 40 mg at 01/25/23 6644   sodium chloride flush (NS) 0.9 % injection 10-40 mL, 10-40 mL, Intracatheter, Q12H, Tat, David, MD, 10 mL at 01/25/23 0831   sodium chloride flush (NS) 0.9 % injection 10-40 mL, 10-40 mL, Intracatheter, PRN, Tat, David, MD   thiamine (VITAMIN B1) injection 100 mg, 100 mg, Intravenous, Daily, Calton Dach I, RPH, 100 mg at 01/25/23 0347   traZODone (DESYREL) tablet 50 mg, 50 mg, Oral, QHS PRN, Catarina Hartshorn, MD, 50 mg at 01/14/23 2218  Allergies: Allergies  Allergen Reactions   Prednisone Swelling  Legs swell    Tape Hives   Sulfa Antibiotics Rash       Objective  Vital signs:  Temp:  [97.6 F (36.4 C)-98.3 F (36.8 C)] 98.3 F (36.8 C) (10/01 0830) Pulse Rate:  [89-109] 98 (10/01 0830) Resp:  [17-18] 17 (10/01 0830) BP: (107-140)/(62-70) 129/62 (10/01 0830) SpO2:  [94 %-100 %] 100 % (10/01 0830) FiO2 (%):  [21 %] 21 % (09/30 2134) Weight:  [107.9 kg] 107.9 kg (10/01 0515)  Psychiatric Specialty Exam:  Presentation  General Appearance:  Appropriate for Environment; Casual  Eye Contact: Fair  Speech: Clear and Coherent  Speech Volume: Normal  Handedness:No data recorded  Mood and Affect  Mood: Irritable  Affect: Flat   Thought  Process  Thought Processes: Linear  Descriptions of Associations:Tangential  Orientation:Full (Time, Place and Person)  Thought Content:Tangential  History of Schizophrenia/Schizoaffective disorder:none Duration of Psychotic Symptoms:No data recorded Hallucinations:Hallucinations: None  Ideas of Reference:None  Suicidal Thoughts:Suicidal Thoughts: No  Homicidal Thoughts:Homicidal Thoughts: No   Sensorium  Memory: Immediate Fair; Recent Poor; Remote Fair  Judgment: Fair  Insight: Shallow   Executive Functions  Concentration: Fair  Attention Span: Poor  Recall: Poor  Fund of Knowledge: Fair  Language: Fair   Psychomotor Activity  Psychomotor Activity:Psychomotor Activity: Decreased   Assets  Assets: Resilience; Housing; Social Support   Sleep  Sleep:Sleep: Fair    Physical Exam: Physical Exam HENT:     Head: Normocephalic and atraumatic.  Pulmonary:     Effort: Pulmonary effort is normal.  Neurological:     Mental Status: She is alert and oriented to person, place, and time.    Review of Systems  Psychiatric/Behavioral:  Positive for depression. Negative for hallucinations and suicidal ideas. The patient is not nervous/anxious.    Blood pressure 129/62, pulse 98, temperature 98.3 F (36.8 C), resp. rate 17, height 5\' 1"  (1.549 m), weight 107.9 kg, SpO2 100%. Body mass index is 44.95 kg/m.

## 2023-01-25 NOTE — Progress Notes (Addendum)
   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  Has had multiple hospitalizations and ED visits in the past several months.  Postoperative course complicated by very poor oral intake, weakness, currently residing in an SNF.  She has refused to eat, drink, and take her medications.  She was admitted to [facility] with confusion.  GI was consulted and did not find an organic cause for her not eating.  After discussing with the family, a feeding tube was recommended and she was transferred to [facility] for surgery consultation. Palliative care has been asked to continue involvement to support patient and further address goals of care as it relates to possible PEG placement,   Today's Discussion 01/25/2023  *Please note that this is a verbal dictation therefore any spelling or grammatical errors are due to the "Dragon Medical One" system interpretation.  Chart reviewed inclusive of vital signs, progress notes, laboratory results, and diagnostic images.   I met with Christine [name] at bedside. We discussed her present health. She continues to not eat. We reviewed that she had met with Dr. Dossie Der yesterday and there is a plan in place for gastrostomy tube placement.   I offered support for Christine [name] given the gravity of her recent health ailments. She expresses the understanding that if she wants to improve she will need to work with the medical teams to do. So she shares understanding in regards to the importance of nutrition and physical mobility.   Christine [name] is hopeful for ongoing chaplain visits as she found this to be very helpful for her as it relates to life transitions.    Questions and concerns addressed/Palliative Support Provided.   Objective Assessment: Vital Signs Vitals:   01/25/23 0515 01/25/23 0830   BP: (!) 140/70 129/62  Pulse: 94 98  Resp: 18 17  Temp: 97.8 F (36.6 C) 98.3 F (36.8 C)  SpO2: 100% 100%    Intake/Output Summary (Last 24 hours) at 01/25/2023 1031 Last data filed at 01/25/2023 0900 Gross per 24 hour  Intake 1401.75 ml  Output 400 ml  Net 1001.75 ml   Last Weight  Most recent update: 01/25/2023  5:35 AM    Weight  107.9 kg (237 lb 14 oz)            Gen:  Elderly AA F in NAD HEENT: Dry mucous membranes CV: Regular rate and rhythm  PULM: On RA, breathing is even and nonlabored ABD: soft/nontender EXT: (+) edema  Neuro: Alert and oriented x3   SUMMARY OF RECOMMENDATIONS   DNAR/DNI  Plan for PEG Placement   Appreciate PT/OT for mobility efforts  Nursing to provide oral care TID  Chaplain support  Ongoing PMT support  Billing based on MDM: Moderate  ______________________________________________________________________________________ Lamarr Lulas [facility]tr[facility] Palliative Medicine Team Team Cell Phone: [phone] Please utilize secure chat with additional questions, if there is no response within 30 minutes please call the above phone number  Palliative Medicine Team providers are available by phone from 7am to 7pm daily and can be reached through the team cell phone.  Should this patient require assistance outside of these hours, please call the patient's attending physician.

## 2023-01-25 NOTE — Progress Notes (Signed)
Hypoglycemic Event  CBG: 57  Treatment: D50 25 mL (12.5 gm)  Symptoms: None  Follow-up CBG:  Time: 1700 CBG Result:68  Possible Reasons for Event: Inadequate meal intake  Comments/MD notified:Ellender made aware, stated to give another 25 ml of D50    Averee Harb A Keenan Trefry

## 2023-01-25 NOTE — Progress Notes (Signed)
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  Has had multiple hospitalizations and ED visits in the past several months.  Postoperative course complicated by very poor oral intake, weakness, currently residing in an SNF.  She has refused to eat, drink, and take her medications.  She was admitted to [facility] with confusion.  GI was consulted and did not find an organic cause for her not eating.  After discussing with the family, a feeding tube was recommended and she was transferred to [facility] for surgery consultation.  Subjective / 24h Interval events: No complaint.  Assesement and Plan: Principal Problem:   Hypernatremia Active Problems:   Acute metabolic encephalopathy   Essential hypertension   Gastro-esophageal reflux disease with esophagitis   Osteoarthrosis   OSA on CPAP   GAD (generalized anxiety disorder)   Mixed incontinence urge and stress   Morbid obesity (HCC)   Abdominal pain   Gout   Gastroesophageal reflux disease   Prediabetes   Memory difficulties   Hyperlipidemia   Severe dehydration   Altered mental status   Failure to thrive in adult   AKI (acute kidney injury) (HCC)   Gallbladder dilatation   Esophageal dysphagia   Abnormal esophagram   Hypoglycemia   Principal problem Hypernatremia / Severe Dehydration - pt presented with severe clinical findings of free water deficit.  Has received IV fluids and sodium is now normalized.   -Still not eating, getting more dehydrated with slight elevation in her creatinine, back on IV fluids on Monday -After discussion with the surgical team as well as palliative care, PEG tube will be placed this  afternoon.   Active problems Hypoglycemia/Failure to Thrive - due poor oral intake and recent gastric bypass.  Continue to monitor CBGs. Am cortisol 25.7. TSH 1.715. W11--9147. Folate 8.6 -Continues to have recurrent hypoglycemia, refusing to drink orange juice requiring frequent IV glucose.  Continue dextrose infusion until tube feeds can be initiated  CBG (last 3)  Recent Labs    01/24/23 2107 01/25/23 0518 01/25/23 0827  GLUCAP 94 77 74    Adult Failure to Thrive/Esophageal dysmotility, refusing to eat - Patient continues to have poor oral intake and no desire to eat. She continues to " pocket" her pills and food. Pt's sister traveled by train from [place] to come here to visit and help with arrangements for post hospital care -9/20 Esophagram--advanced esophageal dysmotility but no obstruction.  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  GI consulted, they do not appreciate this being an issue for which she could not eat.  After discussions with family, she was transferred to [facility] for surgical evaluation for gastrostomy tube.   -PEG tube to be placed today.  She will also have cholecystectomy per general surgery -Psychiatry to evaluate again today  UTI--ESBL E coli -9/20 UA>50 WBC. 9/20 urine culture = MDR Ecoli.  Has been placed on meropenem, status post 7 days completed 9/29.  Monitor off antibiotics for now  Abdominal pain -9/14 CT abd--limited by motion, but  No obvious obstruction, free air or free fluid; s/p gastric bypass. 9/15 Abd US--GB distended, minimal GB sludge.  LFTs  were normal.  She also underwent a HIDA scan which showed patent cystic and common bile ducts   NAGMA -improved after bicarb drip   Hypokalemia/Hypomagnesemia/Hypophosphatemia -due to monitor and replace as indicated   AKI -baseline creatinine 0.6-1.0, -serum creatinine peaked 1.66, and is currently  at baseline   Acute metabolic encephalopathy/Failure to Thrive - secondary to severe hypernatremia, AKI, mental status now back to baseline  OSA  - nightly CPAP ordered    Essential hypertension  -continue to monitor blood pressure   Morbid Obesity - pt is postop s/p 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 Dossie Der.  BMI 43  GERD - continue PPI   Sacral pressure wounds - noted below, appreciate wound care nursing team consultation   Goals of care -now DNR/DNI, but will get a PEG tube.  Appreciate palliative involvement  Scheduled Meds:  antiseptic oral rinse  15 mL Mouth Rinse BID   calcium carbonate  200 mg of elemental calcium Oral TID   Chlorhexidine Gluconate Cloth  6 each Topical Daily   cholecalciferol  5,000 Units Oral Daily   colchicine  0.6 mg Oral Daily   DULoxetine  60 mg Oral Daily   enoxaparin (LOVENOX) injection  40 mg Subcutaneous Q24H   feeding supplement  237 mL Oral BID BM   feeding supplement (GLUCERNA SHAKE)  237 mL Oral TID BM   ferrous sulfate  325 mg Oral Q breakfast   folic acid  1 mg Intravenous Daily   Gerhardt's butt cream   Topical BID   megestrol  400 mg Oral Daily   multivitamin with minerals  1 tablet Oral BID   mouth rinse  15 mL Mouth Rinse TID   oxybutynin  15 mg Oral QHS   pantoprazole (PROTONIX) IV  40 mg Intravenous Q12H   sodium chloride flush  10-40 mL Intracatheter Q12H   thiamine (VITAMIN B1) injection  100 mg Intravenous Daily   Continuous Infusions:  dextrose 75 mL/hr at 01/24/23 2233   PRN Meds:.acetaminophen **OR** acetaminophen, diazepam, diclofenac Sodium, fentaNYL (SUBLIMAZE) injection, ondansetron **OR** ondansetron (ZOFRAN) IV, oxyCODONE, sodium chloride flush, traZODone  Current Outpatient Medications  Medication Instructions   acetaminophen (TYLENOL) 650 mg, Oral, Every 4 hours PRN   amLODipine (NORVASC) 10 mg, Oral, Daily   antiseptic oral rinse (BIOTENE) LIQD 15 mLs, Mouth Rinse, 2 times daily    ascorbic acid (VITAMIN C) 500 mg, Oral, Daily   colchicine 1.2 mg, Oral, Daily   diclofenac Sodium (VOLTAREN) 2 g, Topical, 4 times daily   DULoxetine (CYMBALTA) 60 mg, Oral, Daily   ferrous sulfate 325 mg, Oral, Daily with breakfast   folic acid (FOLVITE) 1 mg, Oral, Daily   gabapentin (NEURONTIN) 100 mg, Oral, 3 times daily   Multiple Vitamin (MULTIVITAMIN WITH MINERALS) TABS tablet 1 tablet, Oral, Daily   Nutritional Supplements (PROMOD) LIQD 30 mLs, Oral, 2 times daily   omeprazole (PRILOSEC) 20 mg, Oral, Daily   oxybutynin (DITROPAN XL) 15 mg, Oral, Daily at bedtime   promethazine (PHENERGAN) 25 mg, Oral, Every 6 hours PRN   senna (SENOKOT) 8.6 MG TABS tablet 1 tablet, Oral, Daily at bedtime   sodium chloride irrigation 0.9 % irrigation 75 mLs, Irrigation, See admin instructions, Use 75 mL/hr intravenously every shift for fluid resuscitation for 2 days   traMADol (ULTRAM) 50 mg, Oral, 3 times daily PRN   triamterene-hydrochlorothiazide (MAXZIDE-25) 37.5-25 MG tablet 1 tablet, Oral, Daily   zinc gluconate 50 mg, Oral, Daily    Diet Orders (From  admission, onward)     Start     Ordered   01/25/23 0001  Diet NPO time specified  Diet effective midnight        01/24/23 1949            DVT prophylaxis: enoxaparin (LOVENOX) injection 40 mg Start: 01/08/23 2200   Lab Results  Component Value Date   PLT 291 01/25/2023      Code Status: Limited: Do not attempt resuscitation (DNR) -DNR-LIMITED -Do Not Intubate/DNI   Family Communication: no family at bedside   Status is: Inpatient Remains inpatient appropriate because: severity of illness  Level of care: Med-Surg  Consultants:  Palliative care General surgery GI  Objective: Vitals:   01/24/23 2005 01/24/23 2134 01/25/23 0515 01/25/23 0830  BP: 107/67  (!) 140/70 129/62  Pulse: (!) 109 90 94 98  Resp: 18 18 18 17   Temp: 97.6 F (36.4 C)  97.8 F (36.6 C) 98.3 F (36.8 C)  TempSrc: Oral  Oral   SpO2: 100% 95%  100% 100%  Weight:   107.9 kg   Height:        Intake/Output Summary (Last 24 hours) at 01/25/2023 1015 Last data filed at 01/25/2023 0340 Gross per 24 hour  Intake 1401.75 ml  Output 400 ml  Net 1001.75 ml   Wt Readings from Last 3 Encounters:  01/25/23 107.9 kg  11/14/22 114.6 kg  10/27/22 119.1 kg    Examination:  Constitutional: NAD Eyes: lids and conjunctivae normal, no scleral icterus ENMT: mmm Neck: normal, supple Respiratory: clear to auscultation bilaterally, no wheezing, no crackles. Cardiovascular: Regular rate and rhythm, no murmurs / rubs / gallops. No LE edema. Abdomen: soft, no distention, no tenderness. Bowel sounds positive.   Data Reviewed: I have independently reviewed following labs and imaging studies   CBC Recent Labs  Lab 01/20/23 0211 01/21/23 0400 01/23/23 0402 01/25/23 0308  WBC 5.5 5.9 5.5 6.4  HGB 8.5* 8.5* 8.6* 8.5*  HCT 27.2* 27.7* 26.7* 26.6*  PLT 311 323 310 291  MCV 83.7 84.7 85.6 85.3  MCH 26.2 26.0 27.6 27.2  MCHC 31.3 30.7 32.2 32.0  RDW 21.3* 21.2* 21.2* 21.8*    Recent Labs  Lab 01/20/23 0211 01/21/23 0400 01/22/23 0449 01/23/23 0402 01/24/23 0504 01/25/23 0308  NA 139 138  --  138 139 135  K 3.5 3.4*  --  3.6 3.8 3.6  CL 110 112*  --  114* 117* 113*  CO2 21* 20*  --  20* 18* 19*  GLUCOSE 86 80  --  80 76 83  BUN 12 14  --  13 12 9   CREATININE 1.11* 0.92 1.01* 0.83 1.09* 0.81  CALCIUM 7.7* 7.7*  --  8.0* 8.1* 7.9*  AST 19 14*  --   --   --  18  ALT 17 17  --   --   --  15  ALKPHOS 107 95  --   --   --  84  BILITOT 0.8 1.0  --   --   --  0.5  ALBUMIN 1.9* 1.9*  --   --   --  1.8*  MG 1.8 1.9  --  1.7  --  1.8    ------------------------------------------------------------------------------------------------------------------ No results for input(s): "CHOL", "HDL", "LDLCALC", "TRIG", "CHOLHDL", "LDLDIRECT" in the last 72 hours.  Lab Results  Component Value Date   HGBA1C 6.3 (H) 09/01/2022    ------------------------------------------------------------------------------------------------------------------ No results for input(s): "TSH", "T4TOTAL", "T3FREE", "THYROIDAB" in the last 72  hours.  Invalid input(s): "FREET3"  Cardiac Enzymes No results for input(s): "CKMB", "TROPONINI", "MYOGLOBIN" in the last 168 hours.  Invalid input(s): "CK" ------------------------------------------------------------------------------------------------------------------ No results found for: "BNP"  CBG: Recent Labs  Lab 01/24/23 1617 01/24/23 2008 01/24/23 2107 01/25/23 0518 01/25/23 0827  GLUCAP 77 53* 94 77 74    Recent Results (from the past 240 hour(s))  Culture, blood (Routine X 2) w Reflex to ID Panel     Status: None   Collection Time: 01/16/23  8:03 AM   Specimen: Right Antecubital; Blood  Result Value Ref Range Status   Specimen Description   Final    RIGHT ANTECUBITAL BOTTLES DRAWN AEROBIC AND ANAEROBIC   Special Requests   Final    Blood Culture results may not be optimal due to an excessive volume of blood received in culture bottles   Culture   Final    NO GROWTH 5 DAYS Performed at [facility], [address], [place], [place] [address]    Report Status 01/21/2023 FINAL  Final  Culture, blood (Routine X 2) w Reflex to ID Panel     Status: None   Collection Time: 01/16/23  8:03 AM   Specimen: BLOOD RIGHT HAND  Result Value Ref Range Status   Specimen Description   Final    BLOOD RIGHT HAND BOTTLES DRAWN AEROBIC AND ANAEROBIC   Special Requests   Final    Blood Culture results may not be optimal due to an excessive volume of blood received in culture bottles   Culture   Final    NO GROWTH 5 DAYS Performed at [facility], [address], [place], [place] [address]    Report Status 01/21/2023 FINAL  Final     Radiology Studies: No results found.   Pamella Pert, MD, PhD [facility]  Between 7 am - 7 pm I am available, please contact me via  Amion (for emergencies) or Securechat (non urgent messages)  Between 7 pm - 7 am I am not available, please contact night coverage MD/APP via Amion

## 2023-01-25 NOTE — Anesthesia Procedure Notes (Signed)
Procedure Name: Intubation Date/Time: 01/25/2023 5:30 PM  Performed by: Kayleen Memos, CRNAPre-anesthesia Checklist: Patient identified, Emergency Drugs available, Suction available and Patient being monitored Patient Re-evaluated:Patient Re-evaluated prior to induction Oxygen Delivery Method: Circle System Utilized Preoxygenation: Pre-oxygenation with 100% oxygen Induction Type: IV induction Ventilation: Mask ventilation without difficulty Laryngoscope Size: Mac and 4 Grade View: Grade I Tube type: Oral Tube size: 7.0 mm Number of attempts: 1 Airway Equipment and Method: Stylet and Oral airway Placement Confirmation: ETT inserted through vocal cords under direct vision, positive ETCO2 and breath sounds checked- equal and bilateral Secured at: 22 cm Tube secured with: Tape Dental Injury: Teeth and Oropharynx as per pre-operative assessment

## 2023-01-25 NOTE — Anesthesia Postprocedure Evaluation (Signed)
Anesthesia Post Note  Patient: Christine [name]  Procedure(s) Performed: LAPAROSCOPIC INSERTION REMNANT GASTROSTOMY TUBE (Abdomen) LAPAROSCOPIC CHOLECYSTECTOMY WITH INTRAOPERATIVE CHOLANGIOGRAM (Abdomen) ESOPHAGOGASTRODUODENOSCOPY (EGD) (Mouth)     Patient location during evaluation: PACU Anesthesia Type: General Level of consciousness: awake and alert Pain management: pain level controlled Vital Signs Assessment: post-procedure vital signs reviewed and stable Respiratory status: spontaneous breathing, nonlabored ventilation, respiratory function stable and patient connected to nasal cannula oxygen Cardiovascular status: blood pressure returned to baseline and stable Postop Assessment: no apparent nausea or vomiting Anesthetic complications: no  No notable events documented.  Last Vitals:  Vitals:   01/25/23 1942 01/25/23 1945  BP: (!) 162/82 (!) 163/85  Pulse: 87 88  Resp: 19 (!) 21  Temp:  36.7 C  SpO2: 99% 99%    Last Pain:  Vitals:   01/25/23 1930  TempSrc:   PainSc: 4                  Kennieth Rad

## 2023-01-25 NOTE — Transfer of Care (Signed)
Immediate Anesthesia Transfer of Care Note  Patient: Christine [name]  Procedure(s) Performed: LAPAROSCOPIC INSERTION REMNANT GASTROSTOMY TUBE (Abdomen) LAPAROSCOPIC CHOLECYSTECTOMY WITH INTRAOPERATIVE CHOLANGIOGRAM (Abdomen) ESOPHAGOGASTRODUODENOSCOPY (EGD) (Mouth)  Patient Location: PACU  Anesthesia Type:General  Level of Consciousness: awake and drowsy  Airway & Oxygen Therapy: Patient Spontanous Breathing and Patient connected to face mask oxygen  Post-op Assessment: Report given to RN, Post -op Vital signs reviewed and stable, and Patient moving all extremities  Post vital signs: Reviewed and stable  Last Vitals:  Vitals Value Taken Time  BP 144/82 01/25/23 1904  Temp    Pulse 83 01/25/23 1907  Resp 21 01/25/23 1907  SpO2 99 % 01/25/23 1907  Vitals shown include unfiled device data.  Last Pain:  Vitals:   01/25/23 1624  TempSrc:   PainSc: 0-No pain      Patients Stated Pain Goal: 0 (01/25/23 1624)  Complications: No notable events documented.

## 2023-01-26 ENCOUNTER — Encounter (HOSPITAL_COMMUNITY): Payer: Self-pay | Admitting: Surgery

## 2023-01-26 DIAGNOSIS — Z7189 Other specified counseling: Secondary | ICD-10-CM | POA: Diagnosis not present

## 2023-01-26 DIAGNOSIS — G9341 Metabolic encephalopathy: Secondary | ICD-10-CM | POA: Diagnosis not present

## 2023-01-26 DIAGNOSIS — Z515 Encounter for palliative care: Secondary | ICD-10-CM | POA: Diagnosis not present

## 2023-01-26 DIAGNOSIS — N179 Acute kidney failure, unspecified: Secondary | ICD-10-CM | POA: Diagnosis not present

## 2023-01-26 DIAGNOSIS — R413 Other amnesia: Secondary | ICD-10-CM

## 2023-01-26 DIAGNOSIS — K2100 Gastro-esophageal reflux disease with esophagitis, without bleeding: Secondary | ICD-10-CM

## 2023-01-26 LAB — PHOSPHORUS: Phosphorus: 3.3 mg/dL (ref 2.5–4.6)

## 2023-01-26 LAB — CBC
HCT: 27.9 % — ABNORMAL LOW (ref 36.0–46.0)
Hemoglobin: 8.7 g/dL — ABNORMAL LOW (ref 12.0–15.0)
MCH: 26.6 pg (ref 26.0–34.0)
MCHC: 31.2 g/dL (ref 30.0–36.0)
MCV: 85.3 fL (ref 80.0–100.0)
Platelets: 262 10*3/uL (ref 150–400)
RBC: 3.27 MIL/uL — ABNORMAL LOW (ref 3.87–5.11)
RDW: 21.4 % — ABNORMAL HIGH (ref 11.5–15.5)
WBC: 5.5 10*3/uL (ref 4.0–10.5)
nRBC: 0 % (ref 0.0–0.2)

## 2023-01-26 LAB — COMPREHENSIVE METABOLIC PANEL
ALT: 26 U/L (ref 0–44)
AST: 56 U/L — ABNORMAL HIGH (ref 15–41)
Albumin: 2.3 g/dL — ABNORMAL LOW (ref 3.5–5.0)
Alkaline Phosphatase: 93 U/L (ref 38–126)
Anion gap: 6 (ref 5–15)
BUN: 7 mg/dL — ABNORMAL LOW (ref 8–23)
CO2: 16 mmol/L — ABNORMAL LOW (ref 22–32)
Calcium: 8.4 mg/dL — ABNORMAL LOW (ref 8.9–10.3)
Chloride: 111 mmol/L (ref 98–111)
Creatinine, Ser: 0.83 mg/dL (ref 0.44–1.00)
GFR, Estimated: >60 mL/min (ref >60)
Glucose, Bld: 142 mg/dL — ABNORMAL HIGH (ref 70–99)
Potassium: 3.9 mmol/L (ref 3.5–5.1)
Sodium: 133 mmol/L — ABNORMAL LOW (ref 135–145)
Total Bilirubin: 0.5 mg/dL (ref 0.3–1.2)
Total Protein: 5.1 g/dL — ABNORMAL LOW (ref 6.5–8.1)

## 2023-01-26 LAB — GLUCOSE, CAPILLARY
Glucose-Capillary: 110 mg/dL — ABNORMAL HIGH (ref 70–99)
Glucose-Capillary: 93 mg/dL (ref 70–99)
Glucose-Capillary: 99 mg/dL (ref 70–99)

## 2023-01-26 LAB — MAGNESIUM: Magnesium: 1.6 mg/dL — ABNORMAL LOW (ref 1.7–2.4)

## 2023-01-26 MED ORDER — SODIUM BICARBONATE 650 MG PO TABS
650.0000 mg | ORAL_TABLET | Freq: Three times a day (TID) | ORAL | Status: DC
  Administered 2023-01-26 – 2023-01-27 (×4): 650 mg
  Filled 2023-01-26 (×5): qty 1

## 2023-01-26 MED ORDER — METHOCARBAMOL 1000 MG/10ML IJ SOLN: 500.0000 mg | Freq: Four times a day (QID) | INTRAVENOUS | Status: DC | PRN

## 2023-01-26 MED ORDER — PROCHLORPERAZINE EDISYLATE 10 MG/2ML IJ SOLN: 10.0000 mg | INTRAMUSCULAR | Status: DC | PRN

## 2023-01-26 MED ORDER — THIAMINE HCL 100 MG/ML IJ SOLN
500.0000 mg | Freq: Three times a day (TID) | INTRAVENOUS | Status: AC
  Administered 2023-01-26 – 2023-01-28 (×8): 500 mg via INTRAVENOUS
  Filled 2023-01-26 (×8): qty 5

## 2023-01-26 MED ORDER — TRAZODONE HCL 50 MG PO TABS: 50.0000 mg | ORAL_TABLET | Freq: Every evening | ORAL | Status: DC | PRN

## 2023-01-26 MED ORDER — MAGNESIUM SULFATE 4 GM/100ML IV SOLN
4.0000 g | Freq: Once | INTRAVENOUS | Status: AC
  Administered 2023-01-26: 4 g via INTRAVENOUS
  Filled 2023-01-26: qty 100

## 2023-01-26 MED ORDER — VITAMIN D 25 MCG (1000 UNIT) PO TABS
5000.0000 [IU] | ORAL_TABLET | Freq: Every day | ORAL | Status: DC
  Administered 2023-01-26 – 2023-02-04 (×10): 5000 [IU]
  Filled 2023-01-26 (×10): qty 5

## 2023-01-26 MED ORDER — OSMOLITE 1.5 CAL PO LIQD
1000.0000 mL | ORAL | Status: DC
  Administered 2023-01-26 – 2023-01-27 (×2): 1000 mL

## 2023-01-26 MED ORDER — FREE WATER
100.0000 mL | Status: DC
  Administered 2023-01-26 – 2023-01-27 (×3): 100 mL

## 2023-01-26 MED ORDER — MIRTAZAPINE 15 MG PO TBDP
7.5000 mg | ORAL_TABLET | Freq: Every day | ORAL | Status: DC
  Administered 2023-01-26 – 2023-02-03 (×9): 7.5 mg
  Filled 2023-01-26 (×10): qty 0.5

## 2023-01-26 MED ORDER — ONDANSETRON HCL 4 MG/2ML IJ SOLN: 4.0000 mg | Freq: Four times a day (QID) | INTRAMUSCULAR | Status: DC | PRN

## 2023-01-26 MED ORDER — ONDANSETRON HCL 4 MG/2ML IJ SOLN
4.0000 mg | Freq: Four times a day (QID) | INTRAMUSCULAR | Status: DC | PRN
  Administered 2023-02-03: 4 mg via INTRAVENOUS
  Filled 2023-01-26: qty 2

## 2023-01-26 MED ORDER — DOCUSATE SODIUM 50 MG/5ML PO LIQD
100.0000 mg | Freq: Two times a day (BID) | ORAL | Status: DC
  Administered 2023-01-26 – 2023-02-04 (×9): 100 mg
  Filled 2023-01-26 (×13): qty 10

## 2023-01-26 MED ORDER — COLCHICINE 0.6 MG PO TABS
0.6000 mg | ORAL_TABLET | Freq: Every day | ORAL | Status: DC
  Administered 2023-01-26 – 2023-02-04 (×10): 0.6 mg
  Filled 2023-01-26 (×10): qty 1

## 2023-01-26 MED ORDER — ONDANSETRON HCL 4 MG PO TABS: 4.0000 mg | ORAL_TABLET | Freq: Four times a day (QID) | ORAL | Status: DC | PRN

## 2023-01-26 MED ORDER — GABAPENTIN 250 MG/5ML PO SOLN
300.0000 mg | Freq: Three times a day (TID) | ORAL | Status: DC
  Administered 2023-01-26 – 2023-01-27 (×6): 300 mg
  Filled 2023-01-26 (×7): qty 6

## 2023-01-26 MED ORDER — CALCIUM GLUCONATE-NACL 1-0.675 GM/50ML-% IV SOLN
1.0000 g | Freq: Once | INTRAVENOUS | Status: AC
  Administered 2023-01-26: 1000 mg via INTRAVENOUS
  Filled 2023-01-26: qty 50

## 2023-01-26 MED ORDER — OXYCODONE HCL 5 MG PO TABS
10.0000 mg | ORAL_TABLET | ORAL | Status: DC | PRN
  Filled 2023-01-26: qty 2

## 2023-01-26 MED ORDER — OXYCODONE HCL 5 MG PO TABS
5.0000 mg | ORAL_TABLET | ORAL | Status: DC | PRN
  Administered 2023-01-27: 5 mg
  Filled 2023-01-26: qty 1

## 2023-01-26 MED ORDER — DOCUSATE SODIUM 100 MG PO CAPS: 100.0000 mg | ORAL_CAPSULE | Freq: Two times a day (BID) | ORAL | Status: DC

## 2023-01-26 MED ORDER — FOLIC ACID 1 MG PO TABS
1.0000 mg | ORAL_TABLET | Freq: Every day | ORAL | Status: DC
  Administered 2023-01-27 – 2023-02-04 (×9): 1 mg
  Filled 2023-01-26 (×9): qty 1

## 2023-01-26 MED ORDER — HYDROMORPHONE HCL 1 MG/ML IJ SOLN
0.5000 mg | INTRAMUSCULAR | Status: DC | PRN
  Administered 2023-01-28: 0.5 mg via INTRAVENOUS
  Filled 2023-01-26: qty 0.5

## 2023-01-26 MED ORDER — ACETAMINOPHEN 325 MG PO TABS
650.0000 mg | ORAL_TABLET | Freq: Four times a day (QID) | ORAL | Status: DC
  Administered 2023-01-26 – 2023-01-28 (×7): 650 mg
  Filled 2023-01-26 (×10): qty 2

## 2023-01-26 MED ORDER — PROSOURCE TF20 ENFIT COMPATIBL EN LIQD
60.0000 mL | Freq: Every day | ENTERAL | Status: DC
  Administered 2023-01-26 – 2023-02-04 (×10): 60 mL
  Filled 2023-01-26 (×9): qty 60

## 2023-01-26 MED ORDER — ADULT MULTIVITAMIN W/MINERALS CH
1.0000 | ORAL_TABLET | Freq: Two times a day (BID) | ORAL | Status: DC
  Administered 2023-01-26 – 2023-02-04 (×19): 1
  Filled 2023-01-26 (×19): qty 1

## 2023-01-26 MED ORDER — SIMETHICONE 40 MG/0.6ML PO SUSP: 80.0000 mg | Freq: Four times a day (QID) | ORAL | Status: DC | PRN

## 2023-01-26 NOTE — Plan of Care (Signed)
  Problem: Pain Management: Goal: Pain level will decrease Outcome: Progressing   Problem: Skin Integrity: Goal: Demonstration of wound healing without infection will improve Outcome: Progressing

## 2023-01-26 NOTE — Plan of Care (Signed)
  Problem: Pain Management: Goal: Pain level will decrease 01/26/2023 0655 by Sammuel Cooper, RN Outcome: Progressing 01/26/2023 0653 by Sammuel Cooper, RN Outcome: Progressing   Problem: Skin Integrity: Goal: Demonstration of wound healing without infection will improve 01/26/2023 0655 by Sammuel Cooper, RN Outcome: Progressing 01/26/2023 0653 by Sammuel Cooper, RN Outcome: Progressing

## 2023-01-26 NOTE — Progress Notes (Signed)
Patient transferred from bed to chair. Able to stand/pivot with two person assistance.

## 2023-01-26 NOTE — Progress Notes (Signed)
1 Day Post-Op  Subjective: Pain improving since surgery.  Some flatus, no bowel movements  Objective: Vital signs in last 24 hours: Temp:  [97.3 F (36.3 C)-99 F (37.2 C)] 98.6 F (37 C) (10/02 0751) Pulse Rate:  [83-98] 95 (10/02 0751) Resp:  [16-21] 16 (10/02 0751) BP: (129-163)/(62-94) 142/76 (10/02 0751) SpO2:  [98 %-100 %] 100 % (10/02 0751) FiO2 (%):  [21 %] 21 % (10/01 2311) Weight:  [107.9 kg] 107.9 kg (10/02 0500) Last BM Date : 01/23/23  Intake/Output from previous day: 10/01 0701 - 10/02 0700 In: 200 [IV Piggyback:200] Out: -  Intake/Output this shift: No intake/output data recorded.  PE: Gen:  Alert, NAD, pleasant Abd: Soft, ND, NT, +BS, incisions well healed  Lab Results:  Recent Labs    01/25/23 0308 01/25/23 1829 01/26/23 0407  WBC 6.4  --  5.5  HGB 8.5* 8.8* 8.7*  HCT 26.6* 26.0* 27.9*  PLT 291  --  262   BMET Recent Labs    01/25/23 0308 01/25/23 1829 01/26/23 0407  NA 135 138 133*  K 3.6 2.5* 3.9  CL 113*  --  111  CO2 19*  --  16*  GLUCOSE 83  --  142*  BUN 9  --  7*  CREATININE 0.81  --  0.83  CALCIUM 7.9*  --  8.4*   PT/INR No results for input(s): "LABPROT", "INR" in the last 72 hours. CMP     Component Value Date/Time   NA 133 (L) 01/26/2023 0407   K 3.9 01/26/2023 0407   CL 111 01/26/2023 0407   CO2 16 (L) 01/26/2023 0407   GLUCOSE 142 (H) 01/26/2023 0407   BUN 7 (L) 01/26/2023 0407   CREATININE 0.83 01/26/2023 0407   CREATININE 0.91 07/26/2017 1127   CALCIUM 8.4 (L) 01/26/2023 0407   PROT 5.1 (L) 01/26/2023 0407   ALBUMIN 2.3 (L) 01/26/2023 0407   AST 56 (H) 01/26/2023 0407   AST 15 07/26/2017 1127   ALT 26 01/26/2023 0407   ALT 14 07/26/2017 1127   ALKPHOS 93 01/26/2023 0407   BILITOT 0.5 01/26/2023 0407   BILITOT 0.5 07/26/2017 1127   GFRNONAA >60 01/26/2023 0407   GFRNONAA >60 07/26/2017 1127   GFRAA >60 02/28/2019 1022   GFRAA >60 07/26/2017 1127   Lipase     Component Value Date/Time   LIPASE 22  11/08/2022 2335    Studies/Results: DG Cholangiogram Operative  Result Date: 01/26/2023 CLINICAL DATA:  Laparoscopic cholecystectomy with intraoperative cholangiogram EXAM: INTRAOPERATIVE CHOLANGIOGRAM TECHNIQUE: Cholangiographic images from the C-arm fluoroscopic device were submitted for interpretation post-operatively. Please see the procedural report for the amount of contrast and the fluoroscopy time utilized. FLUOROSCOPY: Radiation Exposure Index (as provided by the fluoroscopic device): 8.57 mGy Kerma COMPARISON:  Nuclear medicine HIDA scan 01/17/2023 FINDINGS: Intraoperative cine clip is submitted for review. The images demonstrate cannulation of the cystic duct remanent and intraoperative cholangiogram. No evidence of biliary ductal dilatation, stenosis or stricture. Contrast material passes through the ampulla and into the duodenum. IMPRESSION: Normal intraoperative cholangiogram. Electronically Signed   By: Malachy Moan M.D.   On: 01/26/2023 06:40    Anti-infectives: Anti-infectives (From admission, onward)    Start     Dose/Rate Route Frequency Ordered Stop   01/25/23 1309  ceFAZolin (ANCEF) IVPB 2g/100 mL premix        2 g 200 mL/hr over 30 Minutes Intravenous 30 min pre-op 01/25/23 1309 01/25/23 1745   01/20/23 1430  meropenem (MERREM)  1 g in sodium chloride 0.9 % 100 mL IVPB  Status:  Discontinued        1 g 200 mL/hr over 30 Minutes Intravenous Every 8 hours 01/20/23 1338 01/24/23 0801   01/19/23 1700  meropenem (MERREM) 1 g in sodium chloride 0.9 % 100 mL IVPB  Status:  Discontinued        1 g 200 mL/hr over 30 Minutes Intravenous Every 12 hours 01/19/23 1345 01/20/23 1338   01/17/23 1515  meropenem (MERREM) 1 g in sodium chloride 0.9 % 100 mL IVPB  Status:  Discontinued        1 g 200 mL/hr over 30 Minutes Intravenous Every 8 hours 01/17/23 1428 01/19/23 1345   01/16/23 1400  piperacillin-tazobactam (ZOSYN) IVPB 3.375 g  Status:  Discontinued        3.375 g 12.5 mL/hr  over 240 Minutes Intravenous Every 8 hours 01/16/23 1322 01/17/23 1428   01/14/23 1000  cefTRIAXone (ROCEPHIN) 1 g in sodium chloride 0.9 % 100 mL IVPB  Status:  Discontinued        1 g 200 mL/hr over 30 Minutes Intravenous Every 24 hours 01/14/23 0925 01/16/23 1322   01/09/23 0930  cefTRIAXone (ROCEPHIN) 1 g in sodium chloride 0.9 % 100 mL IVPB        1 g 200 mL/hr over 30 Minutes Intravenous Every 24 hours 01/09/23 0840 01/11/23 1253      CT 9/14 - Distended gallbladder. If there is concern of gallbladder pathology ultrasound may be useful as the next step in the workup. - Grossly stable left-sided renal cystic foci. - Surgical changes of previous gastric bypass - Markedly limited examination due to the level of motion. No obvious obstruction, free air or free fluid. In addition, a repeat study could be considered when the patient is more clinically able.  RUQ [place] 9/15 1. Hepatic fatty infiltration. 2. Gallbladder distended. 3. Minimal gallbladder sludge. 4. Echogenic kidneys consistent with chronic medical renal disease. 5. Left kidney cyst. 6. There was limited visualization, as described.  Esophagram 9/20  1. 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 Further evaluation with endoscopy is recommended. 2. Significant delay in swallow initiation/mechanism. Prominent cricopharyngeal bar. 3. Advanced esophageal dysmotility with spasms. 4. Limited study due to patient's body habitus and mobility limitations.   CT chest 9/21 1. No acute cardiopulmonary process (no obvious extrinsic esophageal mass) 2. Minimal atelectasis in the left lower lobe.  HIDA 9/23 1.  Patent cystic and common bile ducts. 2. Elevated gallbladder ejection fraction as can be seen with gallbladder hyperkinesis.  Intra-op IOC and EGD 01/25/23 normal   Assessment/Plan Christine [name] is a 72 y.o. female with hx of  robotic gastric bypass on 09/14/2022 by Dr. Dossie Der on 09/14/22 who is here with FTT - Unsure etiology of mood and mentation issues postoperatively, may have been due to thiamine deficiency, change in antidepressant absorption after gastric bypass, UTI?  - She didn't eat anything all weekend and struggled with hypoglycemia - Discussed with patient, brother (in hall) and sister (over the phone).  Family agrees that she is refusing to eat due to mental health issues, and understand there is no clear organic cause of her failure to thrive.  - Slowly advance tube feeds and monitor for refeeding syndrome, I would plan to advance by 8ml/hr every day to goal.  Check and replace electrolytes  - Okay for bariatric advanced diet    - Patient  will need safe discharge plan - Patient will need continued psychiatric care postoperatively as she is demonstrating self harm by refusing to eat.  Will re-consult psychiatric team to help direct this care.  If psychiatric team does not feel she needs inpatient care, she will need connected to outpatient psychiatric support.     LOS: 18 days   Quentin Ore, MD [facility] Surgery 01/26/2023, 7:59 AM Please see Amion for pager number during day hours 7:00am-4:30pm

## 2023-01-26 NOTE — Progress Notes (Signed)
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  Has had multiple hospitalizations and ED visits in the past several months.  Postoperative course complicated by very poor oral intake, weakness, currently residing in an SNF.  She has refused to eat, drink, and take her medications.  She was admitted to [facility] with confusion.  GI was consulted and did not find an organic cause for her not eating.  After discussing with the family, a feeding tube was recommended and she was transferred to [facility] for surgery consultation. Palliative care has been asked to continue involvement to support patient and further address goals of care as it relates to possible PEG placement,   Today's Discussion 01/26/2023  *Please note that this is a verbal dictation therefore any spelling or grammatical errors are due to the "Dragon Medical One" system interpretation.  Chart reviewed inclusive of vital signs, progress notes, laboratory results, and diagnostic images.   I met with Christine [name] at bedside this afternoon. She was awake and in good spirits. She shares that she does have some abdominal pain post surgery but that she had a similar pain after her gastric bypass. She shares that her blood sugars are the best they have been been. She is encouraged by this.   We reviewed that Christine [name] has worked hard with PT and has been pushed beyond prior limits. We discussed the importance of continuing to pushed herself as her goals are to be mobile again. She understands that if you don't use your muscles they will deteriorate.   Christine [name] and I further discussed her prior sepsis which has led to her being in this situation. She expresses disbelief that such an event could have caused her to become so profoundly weak. We reviewed the  chronic disease trajectory as it relates to physical function and health outcomes.   Christine [name] shares hope for continued improve and feels one step closer to her goals at this time.   Questions and concerns addressed/Palliative Support Provided.   Objective Assessment: Vital Signs Vitals:   01/25/23 2311 01/26/23 0751  BP:  (!) 142/76  Pulse: 87 95  Resp: 20 16  Temp:  98.6 F (37 C)  SpO2: 98% 100%    Intake/Output Summary (Last 24 hours) at 01/26/2023 1320 Last data filed at 01/25/2023 1851 Gross per 24 hour  Intake 200 ml  Output --  Net 200 ml   Last Weight  Most recent update: 01/26/2023  6:55 AM    Weight  107.9 kg (237 lb 14 oz)            Gen:  Elderly AA F in NAD HEENT: Dry mucous membranes CV: Regular rate and rhythm  PULM: On RA, breathing is even and nonlabored ABD: soft/nontender EXT: (+) edema  Neuro: Alert and oriented x3   SUMMARY OF RECOMMENDATIONS   DNAR/DNI  S/P PEG Placement   Appreciate PT/OT for mobility efforts  Ongoing Chaplain support  Incremental PMT support - Please call the team phone if support is needed sooner  Billing based on MDM: Moderate  ______________________________________________________________________________________ Lamarr Lulas [place] Palliative Medicine Team Team Cell Phone: [phone] Please utilize secure chat with additional questions, if there is no response within 30 minutes please call the above phone number  Palliative Medicine Team providers are available by phone from 7am to 7pm  daily and can be reached through the team cell phone.  Should this patient require assistance outside of these hours, please call the patient's attending physician.

## 2023-01-26 NOTE — Progress Notes (Signed)
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 Has had multiple hospitalizations and ED visits in the past several months. Postoperative course complicated by very poor oral intake, weakness, currently residing in an SNF. She has refused to eat, drink, and take her medications. She was admitted to [facility] with confusion. GI was consulted and did not find an organic cause for her not eating. After discussing with the family, a feeding tube was recommended and she was transferred to [facility] for surgery consultation.   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 GI consulted, they do not appreciate this being an issue for which she could not eat.  After discussions with family, she was transferred to [facility] for surgical evaluation for gastrostomy tube.  She was evaluated by psychiatry.    Patient had PEG tube and laparoscopic cholecystectomy with IOC on 10/1.  Started on tube feed.   Subjective: Seen and examined earlier this morning.  No major events overnight of this morning.  She reports abdominal pain that she attributes to surgery.  She rates her pain 4/10.  She says she does not feel like eating.  Not able to tell the reason.  Objective: Vitals:   01/25/23 2311 01/26/23 0500 01/26/23 0751 01/26/23 1525  BP:   (!) 142/76 121/78  Pulse: 87  95 (!) 102  Resp: 20  16 16   Temp:    98.6 F (37 C) 97.9 F (36.6 C)  TempSrc:   Oral Oral  SpO2: 98%  100% 99%  Weight:  107.9 kg    Height:        Examination:  GENERAL: No apparent distress.  Nontoxic. HEENT: MMM.  Vision and hearing grossly intact.  NECK: Supple.  No apparent JVD.  RESP:  No IWOB.  Fair aeration bilaterally. CVS:  RRR. Heart sounds normal.  ABD/GI/GU: BS+. Abd soft and appropriately tender after surgery.  G-tube in place. MSK/EXT:  Moves extremities. No apparent deformity.  1+ BLE edema.  Venous insufficiency. NEURO: Awake, alert and oriented appropriately.  No apparent focal neuro deficit. PSYCH: Calm. Normal affect.   Procedures:  10/1-gastrostomy tube and laparoscopic cholecystectomy  Microbiology summarized: 9/14-COVID-19 PCR nonreactive 9/14-C. difficile negative 9/14-MRSA PCR nonreactive 9/14-GIP negative 9/20-urine culture with ESBL E. Coli 9/22-blood cultures NGTD  Assessment and plan: Adult Failure to Thrive/Esophageal dysmotility, refusing to eat: Patient has no desire to eat.  Unclear if this is due to poor appetite, psychogenic or cognitive deficit.  Evaluated by psychiatry and refused additional meds for depressive symptoms earlier in the course.  Reevaluated by psychiatry on 10/1 and they recommended high-dose thiamine although she has already received this.  Also for gram as above.  -S/p laparoscopic gastrostomy and cholecystectomy with IOC on 10/1 -Received high-dose thiamine from 9/23-9/24.  Restarted on 10/2 after psychiatry recs -Remeron was started on 10/2. -Continue home Cymbalta -Continue tube feed    ESBL E. coli UTI:-9/20 UA>50 WBC. 9/20  urine culture = MDR Ecoli.   -Completed 7 days of IV meropenem 09/29.    Abdominal pain -9/14 CT abd--limited by motion, but  No obvious obstruction, free air or free fluid; s/p gastric bypass. 9/15.  Abd US showed distended gallbladder with sludge.  LFTs normal.  HIDA scan negative.  -S/p laparoscopic cholecystectomy with IOC on  10/1   NAGMA: Slightly worse. -Start sodium bicarb 0.4 tube   Hypokalemia/Hypomagnesemia/Hypophosphatemia -due to FTT.   -Monitor and replace as indicated   AKI -baseline Cr ~0.8.  Resolved.   Acute metabolic encephalopathy: Thought to be due to severe hypernatremia.  Resolved. -Reorientation and delirium precaution  OSA -Nightly CPAP ordered    Essential hypertension   -Monitor  GERD -continue PPI   Goals of care -now DNR/DNI -Palliative medicine following.  Sacral pressure wounds: POA -Wound care per WOC RN -Frequent turning  Morbid obesity Body mass index is 44.95 kg/m. Nutrition Problem: Increased nutrient needs Etiology: wound healing Signs/Symptoms: estimated needs Interventions: MVI, Prostat, Glucerna shake    DVT prophylaxis:  enoxaparin (LOVENOX) injection 40 mg Start: 01/08/23 2200  Code Status: DNR/DNI Family Communication: None at bedside Level of care: Med-Surg Status is: Inpatient Remains inpatient appropriate because: Failure to thrive   Final disposition: SNF Consultants:  GI Psychiatry General Surgery Palliative medicine  55 minutes with more than 50% spent in reviewing records, counseling patient/family and coordinating care.   Sch Meds:  Scheduled Meds:  acetaminophen  650 mg Per Tube Q6H   antiseptic oral rinse  15 mL Mouth Rinse BID   calcium carbonate  200 mg of elemental calcium Oral TID   Chlorhexidine Gluconate Cloth  6 each Topical Daily   cholecalciferol  5,000 Units Per Tube Daily   colchicine  0.6 mg Per Tube Daily   docusate  100 mg Per Tube BID   DULoxetine  60 mg Oral Daily   enoxaparin (LOVENOX) injection  40 mg Subcutaneous Q24H   feeding supplement (GLUCERNA SHAKE)  237 mL Oral TID BM   feeding supplement (PROSource TF20)  60 mL Per Tube Daily   ferrous sulfate  325 mg Oral Q breakfast   [START ON 01/27/2023] folic acid  1 mg Per Tube Daily   free water  100 mL Per Tube Q4H   gabapentin  300 mg Per Tube TID    Gerhardt's butt cream   Topical BID   megestrol  400 mg Oral Daily   mirtazapine  7.5 mg Per Tube QHS   multivitamin with minerals  1 tablet Per Tube BID   mouth rinse  15 mL Mouth Rinse TID   oxybutynin  15 mg Oral QHS   pantoprazole (PROTONIX) IV  40 mg Intravenous Q12H   sodium chloride flush  10-40 mL Intracatheter Q12H   Continuous Infusions:  dextrose 75 mL/hr at 01/26/23 1647   feeding supplement (OSMOLITE 1.5 CAL) 20 mL/hr at 01/26/23 1647   methocarbamol (ROBAXIN) IV     thiamine (VITAMIN B1) injection Stopped (01/26/23 1330)   PRN Meds:.acetaminophen **OR** acetaminophen, diazepam, diclofenac Sodium, fentaNYL (SUBLIMAZE) injection, HYDROmorphone (DILAUDID) injection, methocarbamol (ROBAXIN) IV, ondansetron **OR** ondansetron (ZOFRAN) IV, oxyCODONE, oxyCODONE, prochlorperazine, simethicone, sodium chloride flush, traZODone  Antimicrobials: Anti-infectives (From admission, onward)    Start     Dose/Rate Route Frequency Ordered Stop   01/25/23 1309  ceFAZolin (ANCEF) IVPB 2g/100 mL premix        2 g 200 mL/hr over 30 Minutes Intravenous 30 min pre-op 01/25/23 1309 01/25/23 1745  01/20/23 1430  meropenem (MERREM) 1 g in sodium chloride 0.9 % 100 mL IVPB  Status:  Discontinued        1 g 200 mL/hr over 30 Minutes Intravenous Every 8 hours 01/20/23 1338 01/24/23 0801   01/19/23 1700  meropenem (MERREM) 1 g in sodium chloride 0.9 % 100 mL IVPB  Status:  Discontinued        1 g 200 mL/hr over 30 Minutes Intravenous Every 12 hours 01/19/23 1345 01/20/23 1338   01/17/23 1515  meropenem (MERREM) 1 g in sodium chloride 0.9 % 100 mL IVPB  Status:  Discontinued        1 g 200 mL/hr over 30 Minutes Intravenous Every 8 hours 01/17/23 1428 01/19/23 1345   01/16/23 1400  piperacillin-tazobactam (ZOSYN) IVPB 3.375 g  Status:  Discontinued        3.375 g 12.5 mL/hr over 240 Minutes Intravenous Every 8 hours 01/16/23 1322 01/17/23 1428   01/14/23 1000  cefTRIAXone (ROCEPHIN) 1 g in  sodium chloride 0.9 % 100 mL IVPB  Status:  Discontinued        1 g 200 mL/hr over 30 Minutes Intravenous Every 24 hours 01/14/23 0925 01/16/23 1322   01/09/23 0930  cefTRIAXone (ROCEPHIN) 1 g in sodium chloride 0.9 % 100 mL IVPB        1 g 200 mL/hr over 30 Minutes Intravenous Every 24 hours 01/09/23 0840 01/11/23 1253        I have personally reviewed the following labs and images: CBC: Recent Labs  Lab 01/20/23 0211 01/21/23 0400 01/23/23 0402 01/25/23 0308 01/25/23 1829 01/26/23 0407  WBC 5.5 5.9 5.5 6.4  --  5.5  HGB 8.5* 8.5* 8.6* 8.5* 8.8* 8.7*  HCT 27.2* 27.7* 26.7* 26.6* 26.0* 27.9*  MCV 83.7 84.7 85.6 85.3  --  85.3  PLT 311 323 310 291  --  262   BMP &GFR Recent Labs  Lab 01/20/23 0211 01/21/23 0400 01/22/23 0449 01/23/23 0402 01/24/23 0504 01/25/23 0308 01/25/23 1829 01/26/23 0407  NA 139 138  --  138 139 135 138 133*  K 3.5 3.4*  --  3.6 3.8 3.6 2.5* 3.9  CL 110 112*  --  114* 117* 113*  --  111  CO2 21* 20*  --  20* 18* 19*  --  16*  GLUCOSE 86 80  --  80 76 83  --  142*  BUN 12 14  --  13 12 9   --  7*  CREATININE 1.11* 0.92 1.01* 0.83 1.09* 0.81  --  0.83  CALCIUM 7.7* 7.7*  --  8.0* 8.1* 7.9*  --  8.4*  MG 1.8 1.9  --  1.7  --  1.8  --  1.6*  PHOS 3.6 3.4  --  2.7  --   --   --  3.3   Estimated Creatinine Clearance: 72.5 mL/min (by C-G formula based on SCr of 0.83 mg/dL). Liver & Pancreas: Recent Labs  Lab 01/20/23 0211 01/21/23 0400 01/25/23 0308 01/26/23 0407  AST 19 14* 18 56*  ALT 17 17 15 26   ALKPHOS 107 95 84 93  BILITOT 0.8 1.0 0.5 0.5  PROT 5.1* 4.9* 4.7* 5.1*  ALBUMIN 1.9* 1.9* 1.8* 2.3*   No results for input(s): "LIPASE", "AMYLASE" in the last 168 hours. No results for input(s): "AMMONIA" in the last 168 hours. Diabetic: No results for input(s): "HGBA1C" in the last 72 hours. Recent Labs  Lab 01/25/23 1802 01/25/23 1908 01/25/23 2207  01/26/23 0820 01/26/23 1200  GLUCAP 101* 108* 95 110* 93   Cardiac Enzymes: No  results for input(s): "CKTOTAL", "CKMB", "CKMBINDEX", "TROPONINI" in the last 168 hours. No results for input(s): "PROBNP" in the last 8760 hours. Coagulation Profile: No results for input(s): "INR", "PROTIME" in the last 168 hours. Thyroid Function Tests: No results for input(s): "TSH", "T4TOTAL", "FREET4", "T3FREE", "THYROIDAB" in the last 72 hours. Lipid Profile: No results for input(s): "CHOL", "HDL", "LDLCALC", "TRIG", "CHOLHDL", "LDLDIRECT" in the last 72 hours. Anemia Panel: No results for input(s): "VITAMINB12", "FOLATE", "FERRITIN", "TIBC", "IRON", "RETICCTPCT" in the last 72 hours. Urine analysis:    Component Value Date/Time   COLORURINE AMBER (A) 01/14/2023 0630   APPEARANCEUR CLOUDY (A) 01/14/2023 0630   LABSPEC 1.012 01/14/2023 0630   PHURINE 5.0 01/14/2023 0630   GLUCOSEU NEGATIVE 01/14/2023 0630   HGBUR MODERATE (A) 01/14/2023 0630   BILIRUBINUR NEGATIVE 01/14/2023 0630   KETONESUR NEGATIVE 01/14/2023 0630   PROTEINUR 30 (A) 01/14/2023 0630   NITRITE POSITIVE (A) 01/14/2023 0630   LEUKOCYTESUR LARGE (A) 01/14/2023 0630   Sepsis Labs: Invalid input(s): "PROCALCITONIN", "LACTICIDVEN"  Microbiology: No results found for this or any previous visit (from the past 240 hour(s)).  Radiology Studies: DG Cholangiogram Operative  Result Date: 01/26/2023 CLINICAL DATA:  Laparoscopic cholecystectomy with intraoperative cholangiogram EXAM: INTRAOPERATIVE CHOLANGIOGRAM TECHNIQUE: Cholangiographic images from the C-arm fluoroscopic device were submitted for interpretation post-operatively. Please see the procedural report for the amount of contrast and the fluoroscopy time utilized. FLUOROSCOPY: Radiation Exposure Index (as provided by the fluoroscopic device): 8.57 mGy Kerma COMPARISON:  Nuclear medicine HIDA scan 01/17/2023 FINDINGS: Intraoperative cine clip is submitted for review. The images demonstrate cannulation of the cystic duct remanent and intraoperative cholangiogram. No  evidence of biliary ductal dilatation, stenosis or stricture. Contrast material passes through the ampulla and into the duodenum. IMPRESSION: Normal intraoperative cholangiogram. Electronically Signed   By: Malachy Moan M.D.   On: 01/26/2023 06:40      Meric Joye T. Brittin [facility]  If 7PM-7AM, please contact night-coverage [email or web address] 01/26/2023, 5:09 PM

## 2023-01-26 NOTE — Progress Notes (Signed)
Nutrition Brief Note   Patient is s/p PEG tube placement on 10/1. Osmolite 1.5 TF initiated at 2ml/h and diet advanced to bariatric advanced.   Spoke with RN who reports pt has not eaten since resumption of diet order. Spoke with pt at bedside and she endorses ongoing poor appetite. Has not eaten or consumed any Glucerna shakes (pt's preferred protein supplement) today.    Spoke with Surgery MD regarding diet liberalization with goals for pt to improve her po intake. MD agreeable to diet liberalization as well as TF titration. Will adjusted orders and update RN.   Will continue to monitor TF tolerance and po intake. Please re-consult RD if additional nutrition concerns arise in the meantime.   Drusilla Kanner, RDN, LDN Clinical Nutrition

## 2023-01-27 ENCOUNTER — Encounter: Payer: Self-pay | Admitting: Podiatry

## 2023-01-27 DIAGNOSIS — Z7189 Other specified counseling: Secondary | ICD-10-CM | POA: Diagnosis not present

## 2023-01-27 DIAGNOSIS — F411 Generalized anxiety disorder: Secondary | ICD-10-CM | POA: Diagnosis not present

## 2023-01-27 DIAGNOSIS — Z515 Encounter for palliative care: Secondary | ICD-10-CM | POA: Diagnosis not present

## 2023-01-27 DIAGNOSIS — R404 Transient alteration of awareness: Secondary | ICD-10-CM | POA: Diagnosis not present

## 2023-01-27 DIAGNOSIS — G9341 Metabolic encephalopathy: Secondary | ICD-10-CM | POA: Diagnosis not present

## 2023-01-27 DIAGNOSIS — N179 Acute kidney failure, unspecified: Secondary | ICD-10-CM | POA: Diagnosis not present

## 2023-01-27 DIAGNOSIS — E870 Hyperosmolality and hypernatremia: Secondary | ICD-10-CM | POA: Diagnosis not present

## 2023-01-27 DIAGNOSIS — R627 Adult failure to thrive: Secondary | ICD-10-CM | POA: Diagnosis not present

## 2023-01-27 LAB — GLUCOSE, CAPILLARY
Glucose-Capillary: 97 mg/dL (ref 70–99)
Glucose-Capillary: 107 mg/dL — ABNORMAL HIGH (ref 70–99)
Glucose-Capillary: 96 mg/dL (ref 70–99)
Glucose-Capillary: 108 mg/dL — ABNORMAL HIGH (ref 70–99)
Glucose-Capillary: 88 mg/dL (ref 70–99)

## 2023-01-27 LAB — MAGNESIUM: Magnesium: 2.4 mg/dL (ref 1.7–2.4)

## 2023-01-27 LAB — COMPREHENSIVE METABOLIC PANEL
ALT: 19 U/L (ref 0–44)
AST: 31 U/L (ref 15–41)
Albumin: 2.1 g/dL — ABNORMAL LOW (ref 3.5–5.0)
Alkaline Phosphatase: 96 U/L (ref 38–126)
Anion gap: 7 (ref 5–15)
BUN: 13 mg/dL (ref 8–23)
CO2: 17 mmol/L — ABNORMAL LOW (ref 22–32)
Calcium: 7.9 mg/dL — ABNORMAL LOW (ref 8.9–10.3)
Chloride: 105 mmol/L (ref 98–111)
Creatinine, Ser: 1.09 mg/dL — ABNORMAL HIGH (ref 0.44–1.00)
GFR, Estimated: 55 mL/min — ABNORMAL LOW (ref >60)
Glucose, Bld: 118 mg/dL — ABNORMAL HIGH (ref 70–99)
Potassium: 3.3 mmol/L — ABNORMAL LOW (ref 3.5–5.1)
Sodium: 129 mmol/L — ABNORMAL LOW (ref 135–145)
Total Bilirubin: 0.7 mg/dL (ref 0.3–1.2)
Total Protein: 4.9 g/dL — ABNORMAL LOW (ref 6.5–8.1)

## 2023-01-27 LAB — CBC
HCT: 24.8 % — ABNORMAL LOW (ref 36.0–46.0)
Hemoglobin: 8 g/dL — ABNORMAL LOW (ref 12.0–15.0)
MCH: 27.1 pg (ref 26.0–34.0)
MCHC: 32.3 g/dL (ref 30.0–36.0)
MCV: 84.1 fL (ref 80.0–100.0)
Platelets: 238 10*3/uL (ref 150–400)
RBC: 2.95 MIL/uL — ABNORMAL LOW (ref 3.87–5.11)
RDW: 21.6 % — ABNORMAL HIGH (ref 11.5–15.5)
WBC: 6 10*3/uL (ref 4.0–10.5)
nRBC: 0 % (ref 0.0–0.2)

## 2023-01-27 LAB — SURGICAL PATHOLOGY

## 2023-01-27 LAB — PHOSPHORUS: Phosphorus: 2.5 mg/dL (ref 2.5–4.6)

## 2023-01-27 MED ORDER — THIAMINE MONONITRATE 100 MG PO TABS
100.0000 mg | ORAL_TABLET | Freq: Every day | ORAL | Status: DC
  Administered 2023-01-29 – 2023-02-01 (×4): 100 mg via ORAL
  Filled 2023-01-27 (×4): qty 1

## 2023-01-27 MED ORDER — FREE WATER
100.0000 mL | Freq: Three times a day (TID) | Status: DC
  Administered 2023-01-27 – 2023-02-04 (×26): 100 mL

## 2023-01-27 MED ORDER — QUETIAPINE FUMARATE 50 MG PO TABS: 25.0000 mg | ORAL_TABLET | Freq: Every day | ORAL | Status: DC | PRN

## 2023-01-27 MED ORDER — POTASSIUM CHLORIDE 20 MEQ PO PACK
40.0000 meq | PACK | ORAL | Status: AC
  Administered 2023-01-27 (×2): 40 meq
  Filled 2023-01-27 (×2): qty 2

## 2023-01-27 NOTE — Progress Notes (Signed)
This chaplain attempted F/U spiritual care.The Pt. is asleep in the bedside recliner as the chaplain enters the room. The chaplain entering the room wakes up the Pt. The chaplain is unsure if the Pt. is completely awake as the Pt. and chaplain greet each other.  The chaplain understands from the Pt. she has been in the recliner most of the day. The chaplain notes the Pt. has soiled herself and asks permission to update the RN. The Pt. has gone back to sleep as the chaplain returns.  This chaplain is available for F/U spiritual care as needed.  Chaplain Stephanie Acre [phone]

## 2023-01-27 NOTE — Progress Notes (Signed)
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 Has had multiple hospitalizations and ED visits in the past several months. Postoperative course complicated by very poor oral intake, weakness, currently residing in an SNF. She has refused to eat, drink, and take her medications. She was admitted to [facility] with confusion. GI was consulted and did not find an organic cause for her not eating. After discussing with the family, a feeding tube was recommended and she was transferred to [facility] for surgery consultation.   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 GI consulted, they do not appreciate this being an issue for which she could not eat.  After discussions with family, she was transferred to [facility] for surgical evaluation for gastrostomy tube.  She was evaluated by psychiatry.    Patient had PEG tube and laparoscopic cholecystectomy with IOC on 10/1.  Started on tube feed.  Likely discharge to SNF once tube feed at goal.   Subjective: Seen and examined earlier this morning.  No major events overnight of this morning.  No complaints.  Objective: Vitals:   01/26/23 1525 01/27/23 0500 01/27/23 0541 01/27/23 0934  BP: 121/78  136/73 120/71  Pulse: (!) 102  99 (!) 108  Resp: 16  18 20   Temp: 97.9 F (36.6 C)  97.9 F (36.6 C) 98.4 F (36.9 C)  TempSrc: Oral  Oral Oral   SpO2: 99%  95% 99%  Weight:  111.4 kg    Height:        Examination:  GENERAL: No apparent distress.  Nontoxic. HEENT: MMM.  Vision and hearing grossly intact.  NECK: Supple.  No apparent JVD.  RESP:  No IWOB.  Fair aeration bilaterally. CVS:  RRR. Heart sounds normal.  ABD/GI/GU: BS+. Abd soft.  Nontender.  G-tube in place. MSK/EXT:  Moves extremities. No apparent deformity.  1+ BLE edema.  Venous insufficiency. NEURO: Awake, alert and oriented appropriately.  No apparent focal neuro deficit. PSYCH: Calm. Normal affect.   Procedures:  10/1-gastrostomy tube and laparoscopic cholecystectomy  Microbiology summarized: 9/14-COVID-19 PCR nonreactive 9/14-C. difficile negative 9/14-MRSA PCR nonreactive 9/14-GIP negative 9/20-urine culture with ESBL E. Coli 9/22-blood cultures NGTD  Assessment and plan: Adult Failure to Thrive/Esophageal dysmotility, refusing to eat: Patient has no desire to eat.  Unclear if this is due to poor appetite, psychogenic or cognitive deficit.  Evaluated by psychiatry and refused additional meds for depressive symptoms earlier in the course.  Reevaluated by psychiatry on 10/1 and they recommended high-dose thiamine although she has already received this.  Also for gram as above.  -S/p laparoscopic gastrostomy and cholecystectomy with IOC on 10/1 -Received high-dose thiamine from 9/23-9/24.  Restarted on 10/2 after psychiatry recs -Remeron was started on 10/2. -Continue home Cymbalta -Continue titrating tube feed.  Discontinue D10   Abdominal pain -9/14 CT abd--limited by motion, but  No obvious obstruction, free air or free fluid; s/p gastric bypass. 9/15.  Abd  US showed distended gallbladder with sludge.  LFTs normal.  HIDA scan negative.  -S/p laparoscopic cholecystectomy with IOC on 10/1   Acute metabolic encephalopathy: Thought to be due to severe hypernatremia.  Resolved. -Reorientation and delirium  precaution  Hypokalemia/Hypomagnesemia/Hypophosphatemia -due to FTT.   -Monitor and replace as indicated   AKI -baseline Cr ~0.8.  Creatinine slightly up.  Continue monitoring.  ESBL E. coli UTI: Completed 7 days of IV meropenem 09/29.    NAGMA: Stable. -Continue sodium bicarb per tube  Hypernatremia: Resolved.  Hyponatremia: Due to free water? -Decrease free water. -Discontinue D10.  OSA -Nightly CPAP ordered    Essential hypertension   -Monitor  GERD -continue PPI   Goals of care -now DNR/DNI -Palliative medicine following.  Sacral pressure wounds: POA -Wound care per WOC RN -Frequent turning  Morbid obesity Body mass index is 46.4 kg/m. Nutrition Problem: Increased nutrient needs Etiology: wound healing Signs/Symptoms: estimated needs Interventions: MVI, Prostat, Glucerna shake    DVT prophylaxis:  enoxaparin (LOVENOX) injection 40 mg Start: 01/08/23 2200  Code Status: DNR/DNI Family Communication: None at bedside Level of care: Med-Surg Status is: Inpatient Remains inpatient appropriate because: Failure to thrive   Final disposition: SNF Consultants:  GI Psychiatry General Surgery Palliative medicine  35 minutes with more than 50% spent in reviewing records, counseling patient/family and coordinating care.   Sch Meds:  Scheduled Meds:  acetaminophen  650 mg Per Tube Q6H   antiseptic oral rinse  15 mL Mouth Rinse BID   Chlorhexidine Gluconate Cloth  6 each Topical Daily   cholecalciferol  5,000 Units Per Tube Daily   colchicine  0.6 mg Per Tube Daily   docusate  100 mg Per Tube BID   DULoxetine  60 mg Oral Daily   enoxaparin (LOVENOX) injection  40 mg Subcutaneous Q24H   feeding supplement (GLUCERNA SHAKE)  237 mL Oral TID BM   feeding supplement (PROSource TF20)  60 mL Per Tube Daily   ferrous sulfate  325 mg Oral Q breakfast   folic acid  1 mg Per Tube Daily   free water  100 mL Per Tube Q8H   gabapentin  300 mg Per Tube TID    Gerhardt's butt cream   Topical BID   megestrol  400 mg Oral Daily   mirtazapine  7.5 mg Per Tube QHS   multivitamin with minerals  1 tablet Per Tube BID   mouth rinse  15 mL Mouth Rinse TID   oxybutynin  15 mg Oral QHS   pantoprazole (PROTONIX) IV  40 mg Intravenous Q12H   sodium bicarbonate  650 mg Per Tube TID   sodium chloride flush  10-40 mL Intracatheter Q12H   [START ON 01/29/2023] thiamine  100 mg Oral Daily   Continuous Infusions:  feeding supplement (OSMOLITE 1.5 CAL) 20 mL/hr at 01/26/23 1647   methocarbamol (ROBAXIN) IV     thiamine (VITAMIN B1) injection 500 mg (01/27/23 0555)   PRN Meds:.acetaminophen **OR** acetaminophen, diazepam, diclofenac Sodium, fentaNYL (SUBLIMAZE) injection, HYDROmorphone (DILAUDID) injection, methocarbamol (ROBAXIN) IV, ondansetron **OR** ondansetron (ZOFRAN) IV, oxyCODONE, oxyCODONE, prochlorperazine, simethicone, sodium chloride flush, traZODone  Antimicrobials: Anti-infectives (From admission, onward)    Start     Dose/Rate Route Frequency Ordered Stop   01/25/23 1309  ceFAZolin (ANCEF) IVPB 2g/100 mL premix        2 g 200 mL/hr over 30 Minutes Intravenous 30 min pre-op 01/25/23 1309 01/25/23 1745   01/20/23 1430  meropenem (MERREM) 1 g in sodium chloride 0.9 %  100 mL IVPB  Status:  Discontinued        1 g 200 mL/hr over 30 Minutes Intravenous Every 8 hours 01/20/23 1338 01/24/23 0801   01/19/23 1700  meropenem (MERREM) 1 g in sodium chloride 0.9 % 100 mL IVPB  Status:  Discontinued        1 g 200 mL/hr over 30 Minutes Intravenous Every 12 hours 01/19/23 1345 01/20/23 1338   01/17/23 1515  meropenem (MERREM) 1 g in sodium chloride 0.9 % 100 mL IVPB  Status:  Discontinued        1 g 200 mL/hr over 30 Minutes Intravenous Every 8 hours 01/17/23 1428 01/19/23 1345   01/16/23 1400  piperacillin-tazobactam (ZOSYN) IVPB 3.375 g  Status:  Discontinued        3.375 g 12.5 mL/hr over 240 Minutes Intravenous Every 8 hours 01/16/23 1322 01/17/23 1428    01/14/23 1000  cefTRIAXone (ROCEPHIN) 1 g in sodium chloride 0.9 % 100 mL IVPB  Status:  Discontinued        1 g 200 mL/hr over 30 Minutes Intravenous Every 24 hours 01/14/23 0925 01/16/23 1322   01/09/23 0930  cefTRIAXone (ROCEPHIN) 1 g in sodium chloride 0.9 % 100 mL IVPB        1 g 200 mL/hr over 30 Minutes Intravenous Every 24 hours 01/09/23 0840 01/11/23 1253        I have personally reviewed the following labs and images: CBC: Recent Labs  Lab 01/21/23 0400 01/23/23 0402 01/25/23 0308 01/25/23 1829 01/26/23 0407 01/27/23 0435  WBC 5.9 5.5 6.4  --  5.5 6.0  HGB 8.5* 8.6* 8.5* 8.8* 8.7* 8.0*  HCT 27.7* 26.7* 26.6* 26.0* 27.9* 24.8*  MCV 84.7 85.6 85.3  --  85.3 84.1  PLT 323 310 291  --  262 238   BMP &GFR Recent Labs  Lab 01/21/23 0400 01/22/23 0449 01/23/23 0402 01/24/23 0504 01/25/23 0308 01/25/23 1829 01/26/23 0407 01/27/23 0435  NA 138  --  138 139 135 138 133* 129*  K 3.4*  --  3.6 3.8 3.6 2.5* 3.9 3.3*  CL 112*  --  114* 117* 113*  --  111 105  CO2 20*  --  20* 18* 19*  --  16* 17*  GLUCOSE 80  --  80 76 83  --  142* 118*  BUN 14  --  13 12 9   --  7* 13  CREATININE 0.92   < > 0.83 1.09* 0.81  --  0.83 1.09*  CALCIUM 7.7*  --  8.0* 8.1* 7.9*  --  8.4* 7.9*  MG 1.9  --  1.7  --  1.8  --  1.6* 2.4  PHOS 3.4  --  2.7  --   --   --  3.3 2.5   < > = values in this interval not displayed.   Estimated Creatinine Clearance: 56.3 mL/min (A) (by C-G formula based on SCr of 1.09 mg/dL (H)). Liver & Pancreas: Recent Labs  Lab 01/21/23 0400 01/25/23 0308 01/26/23 0407 01/27/23 0435  AST 14* 18 56* 31  ALT 17 15 26 19   ALKPHOS 95 84 93 96  BILITOT 1.0 0.5 0.5 0.7  PROT 4.9* 4.7* 5.1* 4.9*  ALBUMIN 1.9* 1.8* 2.3* 2.1*   No results for input(s): "LIPASE", "AMYLASE" in the last 168 hours. No results for input(s): "AMMONIA" in the last 168 hours. Diabetic: No results for input(s): "HGBA1C" in the last 72 hours. Recent Labs  Lab 01/26/23 1200  01/26/23 1704 01/27/23 0645 01/27/23 0932 01/27/23 1149  GLUCAP 93 99 97 107* 96   Cardiac Enzymes: No results for input(s): "CKTOTAL", "CKMB", "CKMBINDEX", "TROPONINI" in the last 168 hours. No results for input(s): "PROBNP" in the last 8760 hours. Coagulation Profile: No results for input(s): "INR", "PROTIME" in the last 168 hours. Thyroid Function Tests: No results for input(s): "TSH", "T4TOTAL", "FREET4", "T3FREE", "THYROIDAB" in the last 72 hours. Lipid Profile: No results for input(s): "CHOL", "HDL", "LDLCALC", "TRIG", "CHOLHDL", "LDLDIRECT" in the last 72 hours. Anemia Panel: No results for input(s): "VITAMINB12", "FOLATE", "FERRITIN", "TIBC", "IRON", "RETICCTPCT" in the last 72 hours. Urine analysis:    Component Value Date/Time   COLORURINE AMBER (A) 01/14/2023 0630   APPEARANCEUR CLOUDY (A) 01/14/2023 0630   LABSPEC 1.012 01/14/2023 0630   PHURINE 5.0 01/14/2023 0630   GLUCOSEU NEGATIVE 01/14/2023 0630   HGBUR MODERATE (A) 01/14/2023 0630   BILIRUBINUR NEGATIVE 01/14/2023 0630   KETONESUR NEGATIVE 01/14/2023 0630   PROTEINUR 30 (A) 01/14/2023 0630   NITRITE POSITIVE (A) 01/14/2023 0630   LEUKOCYTESUR LARGE (A) 01/14/2023 0630   Sepsis Labs: Invalid input(s): "PROCALCITONIN", "LACTICIDVEN"  Microbiology: No results found for this or any previous visit (from the past 240 hour(s)).  Radiology Studies: No results found.    Christine [name] T. Ferrah Panagopoulos [facility]  If 7PM-7AM, please contact night-coverage [email or web address] 01/27/2023, 2:33 PM

## 2023-01-27 NOTE — Progress Notes (Signed)
2 Days Post-Op  Subjective: Resting in chair.  Not very talkative today  Objective: Vital signs in last 24 hours: Temp:  [97.9 F (36.6 C)-98.4 F (36.9 C)] 98.4 F (36.9 C) (10/03 0934) Pulse Rate:  [99-108] 108 (10/03 0934) Resp:  [18-20] 20 (10/03 0934) BP: (120-136)/(71-73) 120/71 (10/03 0934) SpO2:  [95 %-99 %] 99 % (10/03 0934) FiO2 (%):  [21 %] 21 % (10/03 0100) Weight:  [111.4 kg] 111.4 kg (10/03 0500) Last BM Date : 01/25/23  Intake/Output from previous day: 10/02 0701 - 10/03 0700 In: 3520.2 [P.O.:240; I.V.:2979.7; NG/GT:150.5; IV Piggyback:150] Out: -  Intake/Output this shift: No intake/output data recorded.  PE: Gen:  Alert, NAD, pleasant Abd: Soft, ND, NT, +BS, incisions well healed  Lab Results:  Recent Labs    01/26/23 0407 01/27/23 0435  WBC 5.5 6.0  HGB 8.7* 8.0*  HCT 27.9* 24.8*  PLT 262 238   BMET Recent Labs    01/26/23 0407 01/27/23 0435  NA 133* 129*  K 3.9 3.3*  CL 111 105  CO2 16* 17*  GLUCOSE 142* 118*  BUN 7* 13  CREATININE 0.83 1.09*  CALCIUM 8.4* 7.9*   PT/INR No results for input(s): "LABPROT", "INR" in the last 72 hours. CMP     Component Value Date/Time   NA 129 (L) 01/27/2023 0435   K 3.3 (L) 01/27/2023 0435   CL 105 01/27/2023 0435   CO2 17 (L) 01/27/2023 0435   GLUCOSE 118 (H) 01/27/2023 0435   BUN 13 01/27/2023 0435   CREATININE 1.09 (H) 01/27/2023 0435   CREATININE 0.91 07/26/2017 1127   CALCIUM 7.9 (L) 01/27/2023 0435   PROT 4.9 (L) 01/27/2023 0435   ALBUMIN 2.1 (L) 01/27/2023 0435   AST 31 01/27/2023 0435   AST 15 07/26/2017 1127   ALT 19 01/27/2023 0435   ALT 14 07/26/2017 1127   ALKPHOS 96 01/27/2023 0435   BILITOT 0.7 01/27/2023 0435   BILITOT 0.5 07/26/2017 1127   GFRNONAA 55 (L) 01/27/2023 0435   GFRNONAA >60 07/26/2017 1127   GFRAA >60 02/28/2019 1022   GFRAA >60 07/26/2017 1127   Lipase     Component Value Date/Time   LIPASE 22 11/08/2022 2335    Studies/Results: DG  Cholangiogram Operative  Result Date: 01/26/2023 CLINICAL DATA:  Laparoscopic cholecystectomy with intraoperative cholangiogram EXAM: INTRAOPERATIVE CHOLANGIOGRAM TECHNIQUE: Cholangiographic images from the C-arm fluoroscopic device were submitted for interpretation post-operatively. Please see the procedural report for the amount of contrast and the fluoroscopy time utilized. FLUOROSCOPY: Radiation Exposure Index (as provided by the fluoroscopic device): 8.57 mGy Kerma COMPARISON:  Nuclear medicine HIDA scan 01/17/2023 FINDINGS: Intraoperative cine clip is submitted for review. The images demonstrate cannulation of the cystic duct remanent and intraoperative cholangiogram. No evidence of biliary ductal dilatation, stenosis or stricture. Contrast material passes through the ampulla and into the duodenum. IMPRESSION: Normal intraoperative cholangiogram. Electronically Signed   By: Malachy Moan M.D.   On: 01/26/2023 06:40    Anti-infectives: Anti-infectives (From admission, onward)    Start     Dose/Rate Route Frequency Ordered Stop   01/25/23 1309  ceFAZolin (ANCEF) IVPB 2g/100 mL premix        2 g 200 mL/hr over 30 Minutes Intravenous 30 min pre-op 01/25/23 1309 01/25/23 1745   01/20/23 1430  meropenem (MERREM) 1 g in sodium chloride 0.9 % 100 mL IVPB  Status:  Discontinued        1 g 200 mL/hr over 30 Minutes Intravenous Every  8 hours 01/20/23 1338 01/24/23 0801   01/19/23 1700  meropenem (MERREM) 1 g in sodium chloride 0.9 % 100 mL IVPB  Status:  Discontinued        1 g 200 mL/hr over 30 Minutes Intravenous Every 12 hours 01/19/23 1345 01/20/23 1338   01/17/23 1515  meropenem (MERREM) 1 g in sodium chloride 0.9 % 100 mL IVPB  Status:  Discontinued        1 g 200 mL/hr over 30 Minutes Intravenous Every 8 hours 01/17/23 1428 01/19/23 1345   01/16/23 1400  piperacillin-tazobactam (ZOSYN) IVPB 3.375 g  Status:  Discontinued        3.375 g 12.5 mL/hr over 240 Minutes Intravenous Every 8 hours  01/16/23 1322 01/17/23 1428   01/14/23 1000  cefTRIAXone (ROCEPHIN) 1 g in sodium chloride 0.9 % 100 mL IVPB  Status:  Discontinued        1 g 200 mL/hr over 30 Minutes Intravenous Every 24 hours 01/14/23 0925 01/16/23 1322   01/09/23 0930  cefTRIAXone (ROCEPHIN) 1 g in sodium chloride 0.9 % 100 mL IVPB        1 g 200 mL/hr over 30 Minutes Intravenous Every 24 hours 01/09/23 0840 01/11/23 1253      CT 9/14 - Distended gallbladder. If there is concern of gallbladder pathology ultrasound may be useful as the next step in the workup. - Grossly stable left-sided renal cystic foci. - Surgical changes of previous gastric bypass - Markedly limited examination due to the level of motion. No obvious obstruction, free air or free fluid. In addition, a repeat study could be considered when the patient is more clinically able.  RUQ [place] 9/15 1. Hepatic fatty infiltration. 2. Gallbladder distended. 3. Minimal gallbladder sludge. 4. Echogenic kidneys consistent with chronic medical renal disease. 5. Left kidney cyst. 6. There was limited visualization, as described.  Esophagram 9/20  1. 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 Further evaluation with endoscopy is recommended. 2. Significant delay in swallow initiation/mechanism. Prominent cricopharyngeal bar. 3. Advanced esophageal dysmotility with spasms. 4. Limited study due to patient's body habitus and mobility limitations.   CT chest 9/21 1. No acute cardiopulmonary process (no obvious extrinsic esophageal mass) 2. Minimal atelectasis in the left lower lobe.  HIDA 9/23 1.  Patent cystic and common bile ducts. 2. Elevated gallbladder ejection fraction as can be seen with gallbladder hyperkinesis.  Intra-op IOC and EGD 01/25/23 normal   Assessment/Plan Christine [name] is a 72 y.o. female with hx of robotic gastric bypass on 09/14/2022 by Dr.  Dossie Der on 09/14/22 who is here with FTT - Unsure etiology of mood and mentation issues postoperatively, may have been due to thiamine deficiency, change in antidepressant absorption after gastric bypass, UTI?   S/p lap chole, lap remnant g tube and normal EGD 01/25/23  - Slowly advance tube feeds and monitor for refeeding syndrome - Okay for diet as tolerated    - Patient will need safe discharge plan - Appreciate psych team input as she is demonstrating self harm by refusing to eat.  She will need connected to continued psychiatric support after discharge     LOS: 19 days   Quentin Ore, MD [facility] 01/27/2023, 4:04 PM Please see Amion for pager number during day hours 7:00am-4:30pm

## 2023-01-27 NOTE — Progress Notes (Addendum)
Physical Therapy Treatment Patient Details Name: Christine [name] MRN: [id number] DOB: 01-11-1951 Today's Date: 01/27/2023   History of Present Illness Christine [name] is a 72 y/o female with hypertension, hyperlipidemia, OSA on CPAP, GERD, anxiety disorder, anemia, morbid obesity 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 Dossie Der.  She has been progressively declining since surgery.  She has had multiple hospitalizations and ED visits in past several months.  Recently seen at [facility] on 01/03/23 with complaints of weakness and poor oral intake.  She was hospitalized in July and August of this year at different facilities for dehydration, weakness, acute renal failure and failure to thrive.  She has been residing in a SNF.  She apparently has not been eating or drinking and refusing to take her medications.  She was sent to ER today for evaluation of altered mentation.  She has had 3 days of progressive weakness and refusing oral intake.  She complains of pain all over body which is not new per record review. Pt is a poor historian.   She was found to be severely dehydrated with hypoglycemia and hypernatremic with a sodium of 157.  She also had acute kidney injury and distended gallbladder on CT scan.  CT head with no acute findings.  Pt was started on IV hydration and admission was requested.    PT Comments  Pt with poor tolerance to treatment today. Pt received sitting on the side of the chair after being transferred by nursing staff. Pt was noted to be sitting on lines including PEG tube however was refusing to stand again again. Pt required +3 total A to stand in order for nursing staff to fix lines and leads and get patient situated in chair. Pt began screaming at the top of her lungs when touched and even swatted at nursing staff. No change in DC/DME recs at this time. PT will continue to follow.    If plan is discharge home, recommend the following: A lot of help with  bathing/dressing/bathroom;Help with stairs or ramp for entrance;Assistance with cooking/housework;Two people to help with walking and/or transfers   Can travel by private vehicle     No  Equipment Recommendations  Other (comment) (Per accepting facility)    Recommendations for Other Services       Precautions / Restrictions Precautions Precautions: Fall Restrictions Weight Bearing Restrictions: No     Mobility  Bed Mobility               General bed mobility comments: Pt received on the side of the chair    Transfers Overall transfer level: Needs assistance Equipment used: 2 person hand held assist Transfers: Sit to/from Stand Sit to Stand: +2 physical assistance, Total assist           General transfer comment: Pt received sitting on the side of the chair being transferred by nursing staff. Pt was noted to be sitting on lines including PEG tube however was refusing to stand again. Pt required +3 total A to stand in order for nursing staff to fix lines and lines and get patient situated in chair.    Ambulation/Gait               General Gait Details: Deferred   Stairs             Wheelchair Mobility     Tilt Bed    Modified Rankin (Stroke Patients Only)       Balance Overall balance assessment: Needs assistance Sitting-balance  support: Feet supported, No upper extremity supported Sitting balance-Leahy Scale: Fair Sitting balance - Comments: sitting EOB   Standing balance support: Bilateral upper extremity supported, Reliant on assistive device for balance Standing balance-Leahy Scale: Poor Standing balance comment: Reliant on therapist                            Cognition Arousal: Alert Behavior During Therapy: Anxious, Lability Overall Cognitive Status: No family/caregiver present to determine baseline cognitive functioning                                 General Comments: Pt labile and screaming when  touched. Pt also swatting at nursing staff screaming "LEAVE ME ALONE"        Exercises      General Comments General comments (skin integrity, edema, etc.): VSS      Pertinent Vitals/Pain Pain Assessment Pain Assessment: No/denies pain    Home Living                          Prior Function            PT Goals (current goals can now be found in the care plan section) Progress towards PT goals: Progressing toward goals    Frequency    Min 1X/week      PT Plan      Co-evaluation              AM-PAC PT "6 Clicks" Mobility   Outcome Measure  Help needed turning from your back to your side while in a flat bed without using bedrails?: A Lot Help needed moving from lying on your back to sitting on the side of a flat bed without using bedrails?: A Lot Help needed moving to and from a bed to a chair (including a wheelchair)?: Total Help needed standing up from a chair using your arms (e.g., wheelchair or bedside chair)?: A Lot Help needed to walk in hospital room?: Total Help needed climbing 3-5 steps with a railing? : Total 6 Click Score: 9    End of Session Equipment Utilized During Treatment: Gait belt Activity Tolerance: Patient limited by fatigue;Treatment limited secondary to agitation Patient left: in chair;with call bell/phone within reach;with chair alarm set;with nursing/sitter in room Nurse Communication: Mobility status;Need for lift equipment PT Visit Diagnosis: Unsteadiness on feet (R26.81);Other abnormalities of gait and mobility (R26.89);Muscle weakness (generalized) (M62.81)     Time: 5956-3875 PT Time Calculation (min) (ACUTE ONLY): 18 min  Charges:    $Therapeutic Activity: 8-22 mins PT General Charges $$ ACUTE PT VISIT: 1 Visit                     Shela Nevin, PT, DPT Acute Rehab Services [phone]    [name] 01/27/2023, 3:34 PM

## 2023-01-27 NOTE — Progress Notes (Signed)
   01/27/23 0100  BiPAP/CPAP/SIPAP  $ Non-Invasive Home Ventilator  Initial  BiPAP/CPAP/SIPAP Pt Type Adult  BiPAP/CPAP/SIPAP Resmed  Mask Type Nasal pillows  FiO2 (%) 21 %  Patient Home Equipment Yes  Safety Check Completed by RT for Home Unit Yes, no issues noted

## 2023-01-27 NOTE — Progress Notes (Signed)
Patient transferred from bed to chair with two person assist. Patient fatigued when attempting to center herself in the chair. Required three people to stand and reposition.

## 2023-01-27 NOTE — Care Management Important Message (Signed)
Important Message  Patient Details  Name: Christine [name] MRN: [id number] Date of Birth: Dec 21, 1950   Important Message Given:  Yes - Medicare IM     Sherilyn Banker 01/27/2023, 2:39 PM

## 2023-01-27 NOTE — TOC Progression Note (Addendum)
Transition of Care [facility]) - Progression Note    Patient Details  Name: Christine [name] MRN: [id number] Date of Birth: [birth year]  Transition [facility]) CM/SW Contact  Carley Hammed, LCSW Phone Number: 01/27/2023, 3:03 PM  Clinical Narrative:    CSW advised that pt is progressing and may DC soon. CSW to start authorization with new PT note. TOC will continue to follow.   3:45 PT note in, CMA starting auth.  4:00 [employer] Portal down, facility starting auth Expected Discharge Plan: Skilled Nursing Facility Barriers to Discharge: Continued Medical Work up  Expected Discharge Plan and Services In-house Referral: Clinical Social Work Discharge Planning Services: CM Consult Post Acute Care Choice: Skilled Nursing Facility Living arrangements for the past 2 months: Skilled Nursing Facility                                       Social Determinants of Health (SDOH) Interventions SDOH Screenings   Food Insecurity: No Food Insecurity (01/18/2023)  Housing: Patient Declined (01/18/2023)  Transportation Needs: No Transportation Needs (01/18/2023)  Utilities: Not At Risk (01/18/2023)  Alcohol Screen: Low Risk  (07/13/2022)  Depression (PHQ2-9): Low Risk  (01/21/2022)  Financial Resource Strain: Low Risk  (12/01/2022)   Received from [facility]  Physical Activity: Insufficiently Active (07/13/2022)  Social Connections: Moderately Integrated (12/01/2022)   Received from [facility]  Stress: No Stress Concern Present (07/13/2022)  Tobacco Use: Medium Risk (01/25/2023)  Health Literacy: Medium Risk (12/01/2022)   Received from [facility] [place]    Readmission Risk Interventions    01/09/2023   11:11 AM  Readmission Risk Prevention Plan  Transportation Screening Complete  HRI or Home Care Consult Complete  Social Work Consult for Recovery Care Planning/Counseling Complete  Palliative Care Screening Not Applicable  Medication Review [employer]) Complete

## 2023-01-27 NOTE — Plan of Care (Signed)
  Problem: Pain Management: Goal: Pain level will decrease Outcome: Progressing   Problem: Pain Managment: Goal: General experience of comfort will improve Outcome: Progressing   Problem: Safety: Goal: Ability to remain free from injury will improve Outcome: Progressing

## 2023-01-27 NOTE — Consult Note (Addendum)
[name] Psychiatry Followup Face-to-Face Psychiatric Evaluation   Service Date: January 27, 2023 LOS:  LOS: 19 days    Assessment  Christine [name] is a 72 y.o. female admitted medically for 01/08/2023 11:43 AM for Hypernatremia. 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 Psychiatry was consulted for concern about self harm via refusal to eat by Ivar Drape.      Her current presentation of lack of appetite and anhedonia is most consistent with MDD. She meets criteria for MDD based on history taken during assessment.  Current outpatient psychotropic medications include cymbalta and historically she has had a favorable response to these medications. She was  compliant with medications prior to admission as evidenced by patient endorsing so, and continue f/u with outpatient PCP documenting this. On initial examination, patient is non chalant about not eating, but endorses that she knows her decreased appetite is unhealthy. Patient endorses the last 4 months (since Roux- en Y) she has been feeling "lost" and endorses decreased concentration, decrease appetite, and anhedonia. Patient also endorses isolating behaviors and psychomotor retardation. Patient's sister also endorsed patient has been complaining about decreased sleep more the last few months as well.   Patient has 2 psychotherapy visits in 2024 in her chart with dx of MDD; however unsure if these were related to patient's procedure as she did go for a psych eval prior to her surgery. Patient hyponatremic today and endorsing some pain around G tube. Patient continues to have slowing in her thought process and struggling with more complex cognitive task such as DOWB and some calculations. Patient did endorse some concern about her pain. Patient  continues to have no appetite and briefly endorsed being overwhelmed by food she got yesterday, but endorses she will try to eat as much as she can. What is interesting is patient is on a liquid diet, so I am not sure if patient was confused about receiving food or if she meant the fluids she has been provided. Patient Na 129 this could be contributing to patient cognitive presentation. Patient appeared to try some task, but was struggling to maintain attention.   Diagnoses:  Active Hospital problems: Principal Problem:   Hypernatremia Active Problems:   Gastro-esophageal reflux disease with esophagitis   Essential hypertension   Osteoarthrosis   OSA on CPAP   GAD (generalized anxiety disorder)   Mixed incontinence urge and stress   Morbid obesity (HCC)   Abdominal pain   Gout   Gastroesophageal reflux disease   Prediabetes   Memory difficulties   Hyperlipidemia   Acute metabolic encephalopathy   Severe dehydration   Altered mental status   Failure to thrive in adult   AKI (acute kidney injury) (HCC)   Gallbladder dilatation   Esophageal dysphagia   Abnormal esophagram   Hypoglycemia     Plan  ## Safety and Observation Level:  - Based on my clinical evaluation, I estimate the patient to be at low risk of self harm in the current setting - At this time, we recommend a routine level of observation. This decision is based on my review of the chart including patient's history and current presentation, interview of the patient, mental status examination, and consideration of suicide risk including evaluating suicidal ideation, plan, intent, suicidal or self-harm behaviors, risk factors, and protective factors.  This judgment is based on our ability to directly address suicide risk, implement suicide prevention strategies and develop a safety plan while the patient is in the clinical setting. Please contact our team if there is a concern that risk level has changed.     ## Medications:   MDD Failure to thrive Will restart Thiamine 100mg  tablet daily after high dose thiamine completed  -- Continue Cymbalta 60mg  daily -- continue Remeron 7.5mg  at bedtime (tom) - continue high dose thiamine protocol : thaimine 500mg  IV q8h for 3 days      ## Medical Decision Making Capacity:  Not formally assessed   ## Further Work-up:  -- New hyponatremia 10/3 -- Per Primary and surgery team At this time do not recommend Inpatient psychiatry as patient's primary problem is a failure to thrive post gastric bypass, and now has a G tube this is best managed in a medical focused setting. Patient co-occurring MDD and hx of chronic anxiety can receive continued treatment in an outpatient capacity.       -- most recent EKG on 01/16/2023 had QtC of 419 w/ HR 106 -- Pertinent labwork reviewed earlier this admission includes:     Latest Ref Rng & Units 01/27/2023    4:35 AM 01/26/2023    4:07 AM 01/25/2023    6:29 PM  CMP  Glucose 70 - 99 mg/dL 409  811    BUN 8 - 23 mg/dL 13  7    Creatinine 9.14 - 1.00 mg/dL 7.82  9.56    Sodium 213 - 145 mmol/L 129  133  138   Potassium 3.5 - 5.1 mmol/L 3.3  3.9  2.5   Chloride 98 - 111 mmol/L 105  111    CO2 22 - 32 mmol/L 17  16    Calcium 8.9 - 10.3 mg/dL 7.9  8.4    Total Protein 6.5 - 8.1 g/dL 4.9  5.1    Total Bilirubin 0.3 - 1.2 mg/dL 0.7  0.5    Alkaline Phos 38 - 126 U/L 96  93    AST 15 - 41 U/L 31  56    ALT 0 - 44 U/L 19  26       ## Disposition:  -- Per primary  ## Behavioral / Environmental:  -- DELIRIUM RECS 1: Avoid benzodiazepines, antihistamines, anticholinergics, and minimize opiate use as these may worsen delirium. 2:Assess, prevent and manage pain as lack of treatment can result in delirium.  3: Recommend consult to PT/OT if not already done. Early mobility and exercise has been shown to decrease duration of delirium.  4:Provide appropriate lighting and clear signage; a clock and calendar should be easily visible to the  patient. 5:Monitor environmental factors. Reduce light and noise at night (close shades, turn off lights, turn off TV, ect). Correct any alterations in sleep cycle. 6: Reorient the patient to person, place, time and situation on each encounter.  7: Correct sensory deficits if possible (replace eye glasses, hearing aids, ect). 8: Avoid restraints. Severely delirious patients benefit from constant observation by a sitter. 9: Do not leave patient unattended.     ##Legal Status   Thank you for this consult request. Recommendations have been communicated to the primary team.  We will continue to follow at this time.   Bobbye Morton, MD    followup history  Relevant Aspects of Hospital Course:  Leontine Radman is a 72 y.o. female admitted medically for 01/08/2023 11:43 AM for Hypernatremia. 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 Psychiatry was consulted for concern about self harm via refusal to eat by Ivar Drape.     Patient Report:  On assessment today patient reports that she slept very well and is pleasant. Patient denies feeling down, hopeless, or anxious. Patient report she continues to feel "lost" and feels as though her brain is not working at its optimal level. Patient is AO to person, place, situation, and year. Patient was 1 day off when asked the day. Patient denies SI, HI, and AVH. Patient is unable to give DOWB, but is able to give the forwards. Patient reports that this is difficult for her. Patient able to calculate 4 quarters in $1 but not able to calculate 11 quarters in $2.75 and is not able to give a guess.   Patient endorsed that she was having a bowel movement on herself.  Per sister patient has endorsed not sleeping well at night. Sister reports that the patient may have  been having hallucinations over the last few months.Per sister, the patient has reported seeing people from the [employer] and dogs in her room. Patient reports that this has been going on over the last 4 months during different hospital stays. Sister reports that she has been more alert recently, and has not had any episodes in the last 2 weeks. She does occasional recall the hallucination and will ask her sister if she remembers it from previous hospitalizations.     Collateral information:  10/1: Sister reports that more recently the patient has been making more plans for activities. She appears to be thinking about the future.    Sister reports that she does think the patient is lonely as their family is mostly in [place], and it is harder to get down to see her. Patient also lives alone. Sister has to keep reminding patient that she is back in [place] and left [place] 2 weeks ago. But her brother came yesterday.    Sister reports that their brother came in yesterday, and she had ordered crab legs from a grocery store online. She never ate them, the family ended up throwing them away. They are not sure she notified her medical team about her order for crab legs, but they know that she really likes Crab legs. RN later confirmed that 9/29 patient had crab labs and was nibbling on them all day 9/30, before staff was able to convince her to throw them away due to them sitting out. They were never put in a fridge.        Psychiatric History:  Information collected from patient   INPT: denies OPT: denies, but PCP dx with GAD and started Cymbalta 60mg  daily Therapy: denies, but endorses that she did get psychology clearance prior to her surgery     Family psych history: Unknown     Social History:  Lives alone     Family History:   The patient's family history includes Alcohol abuse in her brother; Arthritis in her mother and sister; Asthma in her father and mother; Diabetes in her  father, mother, sister, and sister; Drug abuse in her brother; Heart disease in her father and mother; High blood pressure in her father and mother; Varicose Veins in her sister.  Medical History: Past Medical History:  Diagnosis Date  Allergy    dust, pollen, sulfa, prednisone   Anemia    Anxiety    Arthritis    "knees" (04/26/2016)   Colon polyps    benign per pt   Coronary artery disease    mild per 2015 cath in [place] (OM1 30%, RCA 30%)   GERD (gastroesophageal reflux disease)    Gout    History of hiatal hernia    Hyperlipidemia 05/20/2021   Hypertension    Migraine    "none since early /2017" (05/06/2016)   Obesity    OSA on CPAP    uses CPAP   Pre-diabetes    Pre-operative cardiovascular examination 08/22/2008   Renal insufficiency 11/09/2022   Vasculitis (HCC)    Bilateral    Surgical History: Past Surgical History:  Procedure Laterality Date   ABDOMINAL HYSTERECTOMY     "partial"; both ovaries present   APPENDECTOMY  05/06/2016   BIOPSY  04/08/2022   Procedure: BIOPSY;  Surgeon: Sherrilyn Rist, MD;  Location: [facility];  Service: Gastroenterology;;   CARDIAC CATHETERIZATION  ~ 2015   CARDIAC CATHETERIZATION  2015   In [place]   CHILECTOMY Right 06/01/2017   Procedure: CHILECTOMY RIGHT FOOT;  Surgeon: Felecia Shelling, DPM;  Location: [facility];  Service: Podiatry;  Laterality: Right;   CHOLECYSTECTOMY N/A 01/25/2023   Procedure: LAPAROSCOPIC CHOLECYSTECTOMY WITH INTRAOPERATIVE CHOLANGIOGRAM;  Surgeon: Quentin Ore, MD;  Location: [facility] OR;  Service: General;  Laterality: N/A;   ESOPHAGOGASTRODUODENOSCOPY N/A 01/25/2023   Procedure: ESOPHAGOGASTRODUODENOSCOPY (EGD);  Surgeon: Quentin Ore, MD;  Location: [facility] OR;  Service: General;  Laterality: N/A;   ESOPHAGOGASTRODUODENOSCOPY (EGD) WITH PROPOFOL N/A 04/08/2022   Procedure: ESOPHAGOGASTRODUODENOSCOPY (EGD) WITH PROPOFOL;  Surgeon: Sherrilyn Rist, MD;  Location: [facility];  Service:  Gastroenterology;  Laterality: N/A;   LAPAROSCOPIC APPENDECTOMY N/A 05/06/2016   Procedure: LAPAROSCOPIC APPENDECTOMY;  Surgeon: Manus Rudd, MD;  Location: [facility] OR;  Service: General;  Laterality: N/A;   LAPAROSCOPIC INSERTION GASTROSTOMY TUBE N/A 01/25/2023   Procedure: LAPAROSCOPIC INSERTION REMNANT GASTROSTOMY TUBE;  Surgeon: Quentin Ore, MD;  Location: [facility];  Service: General;  Laterality: N/A;   POLYPECTOMY  04/08/2022   Procedure: POLYPECTOMY;  Surgeon: Sherrilyn Rist, MD;  Location: [facility];  Service: Gastroenterology;;   TONSILLECTOMY      Medications:   Current Facility-Administered Medications:    acetaminophen (TYLENOL) tablet 650 mg, 650 mg, Oral, Q6H PRN, 650 mg at 01/27/23 0329 **OR** acetaminophen (TYLENOL) suppository 650 mg, 650 mg, Rectal, Q6H PRN, Stechschulte, Hyman Hopes, MD, 650 mg at 01/15/23 2215   acetaminophen (TYLENOL) tablet 650 mg, 650 mg, Per Tube, Q6H, Stechschulte, Hyman Hopes, MD, 650 mg at 01/27/23 1005   antiseptic oral rinse (BIOTENE) solution 15 mL, 15 mL, Mouth Rinse, BID, Stechschulte, Hyman Hopes, MD, 15 mL at 01/26/23 2329   Chlorhexidine Gluconate Cloth 2 % PADS 6 each, 6 each, Topical, Daily, Stechschulte, Hyman Hopes, MD, 6 each at 01/27/23 1006   cholecalciferol (VITAMIN D3) 25 MCG (1000 UNIT) tablet 5,000 Units, 5,000 Units, Per Tube, Daily, Leatha Gilding, MD, 5,000 Units at 01/27/23 1005   colchicine tablet 0.6 mg, 0.6 mg, Per Tube, Daily, Elvera Lennox, Costin M, MD, 0.6 mg at 01/27/23 1005   dextrose 10 % infusion, , Intravenous, Continuous, Stechschulte, Hyman Hopes, MD, Last Rate: 75 mL/hr at 01/27/23 0555, New Bag at 01/27/23 0555   diazepam (VALIUM) injection 2.5 mg, 2.5 mg, Intravenous, BID PRN, Stechschulte, Hyman Hopes, MD, 2.5 mg at  01/20/23 1505   diclofenac Sodium (VOLTAREN) 1 % topical gel 2 g, 2 g, Topical, QID PRN, Stechschulte, Hyman Hopes, MD   docusate (COLACE) 50 MG/5ML liquid 100 mg, 100 mg, Per Tube, BID, Leatha Gilding, MD, 100 mg at  01/26/23 2316   DULoxetine (CYMBALTA) DR capsule 60 mg, 60 mg, Oral, Daily, Stechschulte, Hyman Hopes, MD, 60 mg at 01/27/23 1004   enoxaparin (LOVENOX) injection 40 mg, 40 mg, Subcutaneous, Q24H, Stechschulte, Hyman Hopes, MD, 40 mg at 01/26/23 2319   feeding supplement (GLUCERNA SHAKE) (GLUCERNA SHAKE) liquid 237 mL, 237 mL, Oral, TID BM, Gherghe, Costin M, MD, 237 mL at 01/26/23 0809   feeding supplement (OSMOLITE 1.5 CAL) liquid 1,000 mL, 1,000 mL, Per Tube, Continuous, Stechschulte, Hyman Hopes, MD, Last Rate: 20 mL/hr at 01/26/23 1647, Infusion Verify at 01/26/23 1647   feeding supplement (PROSource TF20) liquid 60 mL, 60 mL, Per Tube, Daily, Stechschulte, Hyman Hopes, MD, 60 mL at 01/27/23 1031   fentaNYL (SUBLIMAZE) injection 25 mcg, 25 mcg, Intravenous, Q4H PRN, Stechschulte, Hyman Hopes, MD, 25 mcg at 01/23/23 0948   ferrous sulfate tablet 325 mg, 325 mg, Oral, Q breakfast, Stechschulte, Hyman Hopes, MD, 325 mg at 01/27/23 1005   folic acid (FOLVITE) tablet 1 mg, 1 mg, Per Tube, Daily, Leander Rams, RPH, 1 mg at 01/27/23 1005   free water 100 mL, 100 mL, Per Tube, Q8H, Gonfa, Taye T, MD   gabapentin (NEURONTIN) 250 MG/5ML solution 300 mg, 300 mg, Per Tube, TID, Stechschulte, Hyman Hopes, MD, 300 mg at 01/27/23 1031   Gerhardt's butt cream, , Topical, BID, Stechschulte, Hyman Hopes, MD, Given at 01/27/23 1006   HYDROmorphone (DILAUDID) injection 0.5 mg, 0.5 mg, Intravenous, Q3H PRN, Stechschulte, Hyman Hopes, MD   megestrol (MEGACE) 400 MG/10ML suspension 400 mg, 400 mg, Oral, Daily, Stechschulte, Hyman Hopes, MD, 400 mg at 01/27/23 1001   methocarbamol (ROBAXIN) 500 mg in dextrose 5 % 50 mL IVPB, 500 mg, Intravenous, Q6H PRN, Stechschulte, Hyman Hopes, MD   mirtazapine (REMERON SOL-TAB) disintegrating tablet 7.5 mg, 7.5 mg, Per Tube, QHS, Leatha Gilding, MD, 7.5 mg at 01/26/23 2313   multivitamin with minerals tablet 1 tablet, 1 tablet, Per Tube, BID, Leatha Gilding, MD, 1 tablet at 01/27/23 1005   ondansetron (ZOFRAN) tablet 4 mg, 4  mg, Per Tube, Q6H PRN **OR** ondansetron (ZOFRAN) injection 4 mg, 4 mg, Intravenous, Q6H PRN, Leander Rams, [facility]   Oral care mouth rinse, 15 mL, Mouth Rinse, TID, Stechschulte, Hyman Hopes, MD, 15 mL at 01/27/23 0018   oxybutynin (DITROPAN-XL) 24 hr tablet 15 mg, 15 mg, Oral, QHS, Gherghe, Costin M, MD, 15 mg at 01/26/23 2315   oxyCODONE (Oxy IR/ROXICODONE) immediate release tablet 10 mg, 10 mg, Per Tube, Q4H PRN, Stechschulte, Hyman Hopes, MD   oxyCODONE (Oxy IR/ROXICODONE) immediate release tablet 5 mg, 5 mg, Per Tube, Q4H PRN, Stechschulte, Hyman Hopes, MD   pantoprazole (PROTONIX) injection 40 mg, 40 mg, Intravenous, Q12H, Stechschulte, Hyman Hopes, MD, 40 mg at 01/27/23 1006   potassium chloride (KLOR-CON) packet 40 mEq, 40 mEq, Per Tube, Q4H, Gonfa, Taye T, MD, 40 mEq at 01/27/23 1005   prochlorperazine (COMPAZINE) injection 10 mg, 10 mg, Intravenous, Q4H PRN, Stechschulte, Hyman Hopes, MD   simethicone (MYLICON) 40 MG/0.6ML suspension 80 mg, 80 mg, Per Tube, QID PRN, Stechschulte, Hyman Hopes, MD   sodium bicarbonate tablet 650 mg, 650 mg, Per Tube, TID, Candelaria Stagers T, MD, 650 mg at 01/27/23 1005  sodium chloride flush (NS) 0.9 % injection 10-40 mL, 10-40 mL, Intracatheter, Q12H, Stechschulte, Hyman Hopes, MD, 10 mL at 01/27/23 1006   sodium chloride flush (NS) 0.9 % injection 10-40 mL, 10-40 mL, Intracatheter, PRN, Stechschulte, Hyman Hopes, MD, 10 mL at 01/26/23 2316   thiamine (VITAMIN B1) 500 mg in sodium chloride 0.9 % 50 mL IVPB, 500 mg, Intravenous, Q8H, Gherghe, Costin M, MD, Last Rate: 100 mL/hr at 01/27/23 0555, 500 mg at 01/27/23 0555   [START ON 01/29/2023] thiamine (VITAMIN B1) tablet 100 mg, 100 mg, Oral, Daily, Timica Marcom B, MD   traZODone (DESYREL) tablet 50 mg, 50 mg, Per Tube, QHS PRN, Leatha Gilding, MD  Allergies: Allergies  Allergen Reactions   Prednisone Swelling    Legs swell    Tape Hives   Sulfa Antibiotics Rash       Objective  Vital signs:  Temp:  [97.9 F (36.6 C)-98.4 F (36.9 C)]  98.4 F (36.9 C) (10/03 0934) Pulse Rate:  [99-108] 108 (10/03 0934) Resp:  [16-20] 20 (10/03 0934) BP: (120-136)/(71-78) 120/71 (10/03 0934) SpO2:  [95 %-99 %] 99 % (10/03 0934) FiO2 (%):  [21 %] 21 % (10/03 0100) Weight:  [111.4 kg] 111.4 kg (10/03 0500)  Psychiatric Specialty Exam:  Presentation  General Appearance:  Appropriate for Environment  Eye Contact: Fair  Speech: Slow  Speech Volume: Decreased  Handedness:No data recorded  Mood and Affect  Mood: Euthymic  Affect: Flat   Thought Process  Thought Processes: Goal Directed  Descriptions of Associations:Intact  Orientation:Partial (not to day, but oriented to person, place, and year)  Thought Content:-- (concrete)  History of Schizophrenia/Schizoaffective disorder:No data recorded Duration of Psychotic Symptoms:No data recorded Hallucinations:Hallucinations: None  Ideas of Reference:None  Suicidal Thoughts:Suicidal Thoughts: No  Homicidal Thoughts:Homicidal Thoughts: No   Sensorium  Memory: Immediate Fair; Recent Poor  Judgment: Fair  Insight: Shallow   Executive Functions  Concentration: Poor  Attention Span: Poor  Recall: Poor  Fund of Knowledge: Fair  Language: Poor   Psychomotor Activity  Psychomotor Activity:Psychomotor Activity: Psychomotor Retardation   Assets  Assets: Desire for Improvement; Resilience; Social Support; Housing   Sleep  Sleep:Sleep: Good    Physical Exam: Physical Exam HENT:     Head: Normocephalic and atraumatic.  Pulmonary:     Comments: Still had CPAP device in Neurological:     Mental Status: She is alert.    Review of Systems  Psychiatric/Behavioral:  Negative for hallucinations and suicidal ideas. The patient does not have insomnia.    Blood pressure 120/71, pulse (!) 108, temperature 98.4 F (36.9 C), temperature source Oral, resp. rate 20, height 5\' 1"  (1.549 m), weight 111.4 kg, SpO2 99%. Body mass index is 46.4  kg/m.

## 2023-01-28 ENCOUNTER — Inpatient Hospital Stay (HOSPITAL_COMMUNITY): Payer: Medicare HMO

## 2023-01-28 DIAGNOSIS — E870 Hyperosmolality and hypernatremia: Secondary | ICD-10-CM | POA: Diagnosis not present

## 2023-01-28 DIAGNOSIS — Z7189 Other specified counseling: Secondary | ICD-10-CM | POA: Diagnosis not present

## 2023-01-28 DIAGNOSIS — G9341 Metabolic encephalopathy: Secondary | ICD-10-CM | POA: Diagnosis not present

## 2023-01-28 DIAGNOSIS — Z515 Encounter for palliative care: Secondary | ICD-10-CM | POA: Diagnosis not present

## 2023-01-28 LAB — COMPREHENSIVE METABOLIC PANEL
ALT: 18 U/L (ref 0–44)
AST: 22 U/L (ref 15–41)
Albumin: 1.9 g/dL — ABNORMAL LOW (ref 3.5–5.0)
Alkaline Phosphatase: 130 U/L — ABNORMAL HIGH (ref 38–126)
Anion gap: 13 (ref 5–15)
BUN: 17 mg/dL (ref 8–23)
CO2: 14 mmol/L — ABNORMAL LOW (ref 22–32)
Calcium: 8 mg/dL — ABNORMAL LOW (ref 8.9–10.3)
Chloride: 105 mmol/L (ref 98–111)
Creatinine, Ser: 0.98 mg/dL (ref 0.44–1.00)
GFR, Estimated: >60 mL/min (ref >60)
Glucose, Bld: 111 mg/dL — ABNORMAL HIGH (ref 70–99)
Potassium: 4.4 mmol/L (ref 3.5–5.1)
Sodium: 132 mmol/L — ABNORMAL LOW (ref 135–145)
Total Bilirubin: 0.5 mg/dL (ref 0.3–1.2)
Total Protein: 5.1 g/dL — ABNORMAL LOW (ref 6.5–8.1)

## 2023-01-28 LAB — GLUCOSE, CAPILLARY
Glucose-Capillary: 108 mg/dL — ABNORMAL HIGH (ref 70–99)
Glucose-Capillary: 117 mg/dL — ABNORMAL HIGH (ref 70–99)
Glucose-Capillary: 125 mg/dL — ABNORMAL HIGH (ref 70–99)
Glucose-Capillary: 38 mg/dL — CL (ref 70–99)
Glucose-Capillary: 39 mg/dL — CL (ref 70–99)
Glucose-Capillary: 65 mg/dL — ABNORMAL LOW (ref 70–99)
Glucose-Capillary: 77 mg/dL (ref 70–99)
Glucose-Capillary: 90 mg/dL (ref 70–99)

## 2023-01-28 LAB — URINALYSIS, ROUTINE W REFLEX MICROSCOPIC
Bilirubin Urine: NEGATIVE
Glucose, UA: NEGATIVE mg/dL
Hgb urine dipstick: NEGATIVE
Ketones, ur: NEGATIVE mg/dL
Leukocytes,Ua: NEGATIVE
Nitrite: NEGATIVE
Protein, ur: NEGATIVE mg/dL
Specific Gravity, Urine: 1.01 (ref 1.005–1.030)
pH: 6 (ref 5.0–8.0)

## 2023-01-28 LAB — CBC
HCT: 27.7 % — ABNORMAL LOW (ref 36.0–46.0)
Hemoglobin: 8.8 g/dL — ABNORMAL LOW (ref 12.0–15.0)
MCH: 26.7 pg (ref 26.0–34.0)
MCHC: 31.8 g/dL (ref 30.0–36.0)
MCV: 83.9 fL (ref 80.0–100.0)
Platelets: 216 10*3/uL (ref 150–400)
RBC: 3.3 MIL/uL — ABNORMAL LOW (ref 3.87–5.11)
RDW: 21.9 % — ABNORMAL HIGH (ref 11.5–15.5)
WBC: 18.2 10*3/uL — ABNORMAL HIGH (ref 4.0–10.5)
nRBC: 0 % (ref 0.0–0.2)

## 2023-01-28 LAB — VITAMIN B12: Vitamin B-12: 663 pg/mL (ref 180–914)

## 2023-01-28 LAB — BLOOD GAS, VENOUS
Acid-base deficit: 7.3 mmol/L — ABNORMAL HIGH (ref 0.0–2.0)
Bicarbonate: 17.1 mmol/L — ABNORMAL LOW (ref 20.0–28.0)
Drawn by: 52579
O2 Saturation: 63.7 %
Patient temperature: 36.4
pCO2, Ven: 30 mm[Hg] — ABNORMAL LOW (ref 44–60)
pH, Ven: 7.36 (ref 7.25–7.43)
pO2, Ven: 33 mm[Hg] (ref 32–45)

## 2023-01-28 LAB — MAGNESIUM: Magnesium: 2 mg/dL (ref 1.7–2.4)

## 2023-01-28 LAB — TSH: TSH: 1.513 u[IU]/mL (ref 0.350–4.500)

## 2023-01-28 LAB — AMMONIA: Ammonia: <10 umol/L (ref 9–35)

## 2023-01-28 MED ORDER — JEVITY 1.5 CAL/FIBER PO LIQD
1000.0000 mL | ORAL | Status: DC
  Administered 2023-01-28 – 2023-01-30 (×3): 1000 mL
  Filled 2023-01-28 (×4): qty 1000

## 2023-01-28 MED ORDER — OXYCODONE HCL 5 MG PO TABS: 10.0000 mg | ORAL_TABLET | Freq: Four times a day (QID) | ORAL | Status: DC | PRN

## 2023-01-28 MED ORDER — DEXTROSE 50 % IV SOLN
INTRAVENOUS | Status: AC
  Filled 2023-01-28: qty 50

## 2023-01-28 MED ORDER — SODIUM BICARBONATE 650 MG PO TABS
1300.0000 mg | ORAL_TABLET | Freq: Three times a day (TID) | ORAL | Status: DC
  Administered 2023-01-28 – 2023-02-04 (×22): 1300 mg
  Filled 2023-01-28 (×22): qty 2

## 2023-01-28 MED ORDER — OXYCODONE HCL 5 MG PO TABS
5.0000 mg | ORAL_TABLET | Freq: Four times a day (QID) | ORAL | Status: DC | PRN
  Administered 2023-01-28 – 2023-02-03 (×7): 5 mg
  Filled 2023-01-28 (×8): qty 1

## 2023-01-28 MED ORDER — ACETAMINOPHEN 325 MG PO TABS: 650.0000 mg | ORAL_TABLET | Freq: Four times a day (QID) | ORAL | Status: DC | PRN

## 2023-01-28 MED ORDER — METOPROLOL TARTRATE 5 MG/5ML IV SOLN: 2.5000 mg | INTRAVENOUS | Status: DC | PRN

## 2023-01-28 MED ORDER — OXYCODONE HCL 5 MG PO TABS: 5.0000 mg | ORAL_TABLET | Freq: Four times a day (QID) | ORAL | Status: DC | PRN

## 2023-01-28 MED ORDER — DEXTROSE 50 % IV SOLN
12.5000 g | INTRAVENOUS | Status: AC
  Administered 2023-01-28: 12.5 g via INTRAVENOUS

## 2023-01-28 MED ORDER — DEXTROSE 50 % IV SOLN
25.0000 g | INTRAVENOUS | Status: AC
  Administered 2023-01-28: 25 g via INTRAVENOUS

## 2023-01-28 NOTE — Progress Notes (Signed)
Pt started on hypoglycemic protocol due to blood sugar 65. Charge nurse and MD notified. Pt given d50 via PICC, recheck in 30 minutes.  1256- blood sugar is up 117, will continue with plan of care.

## 2023-01-28 NOTE — Progress Notes (Signed)
   01/28/23 0912  Assess: MEWS Score  Temp 100.2 F (37.9 C)  BP 139/76  Pulse Rate (!) 117  Level of Consciousness Responds to Voice  SpO2 100 %  O2 Device Room Air  Assess: MEWS Score  MEWS Temp 0  MEWS Systolic 0  MEWS Pulse 2  MEWS RR 0  MEWS LOC 1  MEWS Score 3  MEWS Score Color Yellow  Assess: if the MEWS score is Yellow or Red  Were vital signs accurate and taken at a resting state? Yes  Does the patient meet 2 or more of the SIRS criteria? Yes  Does the patient have a confirmed or suspected source of infection? No  MEWS guidelines implemented  Yes, yellow  Treat  MEWS Interventions Considered administering scheduled or prn medications/treatments as ordered  Take Vital Signs  Increase Vital Sign Frequency  Yellow: Q2hr x1, continue Q4hrs until patient remains green for 12hrs  Escalate  MEWS: Escalate Yellow: Discuss with charge nurse and consider notifying provider and/or RRT  Notify: Charge Nurse/RN  Name of Charge Nurse/RN Notified Margaretmary Dys RN  Provider Notification  Provider Name/Title Dr.Gonfa  Date Provider Notified 01/28/23  Time Provider Notified 1015  Method of Notification Face-to-face  Notification Reason Change in status  Provider response No new orders  Date of Provider Response 01/28/23  Time of Provider Response 1015  Assess: SIRS CRITERIA  SIRS Temperature  0  SIRS Pulse 1  SIRS Respirations  0  SIRS WBC 0  SIRS Score Sum  1

## 2023-01-28 NOTE — Progress Notes (Signed)
At 2230 informed Dr. Frederick Peers that blood sugar is 77 mg/dl, patient doesn't want to take anything per [place] at the moment, Per Dr. Frederick Peers, increase monitoring to q4 for now.

## 2023-01-28 NOTE — Progress Notes (Addendum)
Nutrition Follow-up / Consult  DOCUMENTATION CODES:   Not applicable  INTERVENTION:   TF via PEG:  Change to Jevity 1.5 at 20 ml/h, increase by 10 ml every 8 hours to goal rate of 40 ml/h. Prosource TF20 60 ml once daily. Free water flushes 100 ml q 8 hours. Provides 1520 kcal, 81 gm protein, 1030 ml free water daily.  Glucerna Shake po TID, each supplement provides 220 kcal and 10 grams of protein.  Carnation Breakfast Essentials BID, each packet mixed with 8 ounces of 2% milk provides 13 grams of protein and 260 calories.  MVI BID.  When able to resume calcium supplement, recommend TUMS (calcium carbonate) TID.  NUTRITION DIAGNOSIS:   Increased nutrient needs related to wound healing as evidenced by estimated needs.  Ongoing   GOAL:   Patient will meet greater than or equal to 90% of their needs  Met with TF + PO intake  MONITOR:   PO intake, Supplement acceptance  REASON FOR ASSESSMENT:   Consult Assessment of nutrition requirement/status, Calorie Count  ASSESSMENT:   72 yo female admitted with severe dehydration, AKI. 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  Received MD Consult d/t patient having hypoglycemia despite TF at goal rate.   Current TF order: Osmolite 1.5 at 40 ml/h via PEG with Prosource TF20 60 ml once daily and free water flushes 100 ml every 8 hours. This provides 1520 kcal, 80 gm protein, 1032 ml free water daily.   Patient is on a regular diet, but she is consuming 0% of meals. She is refusing to drink the Glucerna shakes most days. TF is meeting 100% of her kcal and protein needs. PO fiber intake is minimal and current TF formula does not contain fiber. RD will change to a fiber containing formula to help prevent hypoglycemia.   Labs reviewed. Na 132 CBG: 90-65-117 Received 1 amp D50 today for glucose of 65  Medications reviewed and include vitamin D3, Colace, ferrous sulfate, folic acid,  Remeron, MVI with minerals BID, thiamine. No longer receiving calcium supplements (TUMS).  Weight trending up since admission with edema. Patient with generalized edema per RN assessment.   Diet Order:   Diet Order             Diet regular Room service appropriate? Yes with Assist; Fluid consistency: Thin  Diet effective now                   EDUCATION NEEDS:   Education needs have been addressed  Skin:  Skin Assessment: Skin Integrity Issues: Skin Integrity Issues:: Stage II, Stage III Stage II: coccyx, 8 areas to L & R buttocks, L thigh Stage III: 2 areas to R buttocks  Last BM:  10/4 type 6  Height:   Ht Readings from Last 1 Encounters:  01/25/23 5\' 1"  (1.549 m)    Weight:   Wt Readings from Last 1 Encounters:  01/28/23 111.2 kg    Ideal Body Weight:  47.7 kg  BMI:  Body mass index is 46.32 kg/m.  Estimated Nutritional Needs:   Kcal:  1500-1700  Protein:  75-90g  Fluid:  1.5-2 L   Gabriel Rainwater RD, LDN, CNSC Please refer to Amion for contact information.

## 2023-01-28 NOTE — Progress Notes (Signed)
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 Has had multiple hospitalizations and ED visits in the past several months. Postoperative course complicated by very poor oral intake, weakness, currently residing in an SNF. She has refused to eat, drink, and take her medications. She was admitted to [facility] with confusion. GI was consulted and did not find an organic cause for her not eating. After discussing with the family, a feeding tube was recommended and she was transferred to [facility] for surgery consultation.   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 GI consulted, they do not appreciate this being an issue for which she could not eat.  After discussions with family, she was transferred to [facility] for surgical evaluation for gastrostomy tube.  She was evaluated by psychiatry.    Patient had PEG tube and laparoscopic cholecystectomy with IOC on 10/1.  Started on tube feed.  Now with worsening encephalopathy and leukocytosis   Subjective: Seen and examined earlier this morning.  Patient is very somnolent.  Wakes to voice but not able to stay awake for conversation.  Feels tired.  Follows some commands.  No focal neurodeficit but limited exam.  She received IV Dilaudid about 4:30 AM this morning.  She is also on low-dose Remeron.   Objective: Vitals:   01/28/23 0500 01/28/23  0615 01/28/23 0912 01/28/23 1137  BP:  117/73 139/76 125/78  Pulse:  100 (!) 117 (!) 109  Resp:  20  18  Temp:  98.7 F (37.1 C) 100.2 F (37.9 C) 98.3 F (36.8 C)  TempSrc:  Axillary Axillary   SpO2:  99% 100% 100%  Weight: 111.2 kg     Height:        Examination:  GENERAL: No apparent distress.  Nontoxic. HEENT: MMM.  Vision and hearing grossly intact.  NECK: Supple.  No apparent JVD.  RESP:  No IWOB.  Fair aeration bilaterally. CVS:  RRR. Heart sounds normal.  ABD/GI/GU: BS+. Abd soft.  Nontender.  G-tube in place. MSK/EXT:  Moves extremities. No apparent deformity.  1+ BLE edema.  Venous insufficiency. NEURO: Sleepy.  Wakes to voice but falls back asleep quickly.  Not able to assess orientation.  Symmetric grip strength.  Follows some commands. PSYCH: Somnolent.  Procedures:  10/1-gastrostomy tube and laparoscopic cholecystectomy  Microbiology summarized: 9/14-COVID-19 PCR nonreactive 9/14-C. difficile negative 9/14-MRSA PCR nonreactive 9/14-GIP negative 9/20-urine culture with ESBL E. Coli 9/22-blood cultures NGTD 10/4-blood culture  Assessment and plan: Adult Failure to Thrive/Esophageal dysmotility, refusing to eat: Patient has no desire to eat.  Unclear if this is due to poor appetite, psychogenic or cognitive deficit.  Evaluated by psychiatry and refused additional meds for depressive symptoms earlier in the course.  Reevaluated by psychiatry on 10/1 and they recommended high-dose thiamine although she has already received this.  -S/p laparoscopic gastrostomy and cholecystectomy with IOC on 10/1 -On continuous and bolus tube feed.  -High-dose thiamine from 9/23-9/24,  and 10/2-10/5. -Remeron was started on 10/2. -Continue home Cymbalta -Reconsulted dietitian to see if we can increase tube feed given recurrent hypoglycemia -Regular diet  Acute metabolic encephalopathy: Initially thought to be due to severe hypernatremia.  No significant improvement with  high-dose thiamine.  Encephalopathic again today.  He received IV Dilaudid about 4:30 AM this morning.  He is also on low-dose Remeron.  She has no focal neurosymptoms but limited exam due to mental status.  Grip strength is symmetric.  No facial asymmetry.  No slurred speech. -Discontinued most of sedating medications. -Decreased oxycodone -Basic encephalopathy labs including ammonia, VBG, B12, TSH and RPR -Infectious workup as below  SIRS: She has worsening encephalopathy, significant leukocytosis and mild temp 100.2 this morning.  Her fever could have been suppressed by scheduled Tylenol.  Unclear source of infection but she just had a GI procedure.  No respiratory symptoms.  She has numerous sacral decubitus which could put her at risk of infection.  She is hemodynamically stable. -Check urinalysis, blood culture and chest x-ray -Changed Tylenol to as needed   Abdominal pain -9/14 CT abd--limited by motion, but  No obvious obstruction, free air or free fluid; s/p gastric bypass. 9/15.  Abd US showed distended gallbladder with sludge.  LFTs normal.  HIDA scan negative.  -S/p laparoscopic cholecystectomy with IOC on 10/1   Hypokalemia/Hypomagnesemia/Hypophosphatemia -due to FTT.   -Monitor and replace as indicated   AKI -baseline Cr ~0.8.  Creatinine slightly up.  Continue monitoring.  ESBL E. coli UTI: Completed 7 days of IV meropenem 09/29.    NAGMA: Stable. -Continue sodium bicarb per tube  Hypernatremia: Resolved.  Hyponatremia: Due to free water?  Improved. -Decreased free water on 10/3.  OSA -Nightly CPAP ordered    Essential hypertension   -Monitor  GERD -continue PPI  Sacral pressure wounds: POA -Wound care per WOC RN -Frequent turning   Goals of care-patient does not seem to be thriving despite nutrition and interventions.  I have discussed my concern with patient's Sister, Christine [name] who lives in [place].  Patient does not have family members close by.  Palliative has  already been involved early in the course.  I have told Christine [name] that we may have to regroup for further goal of care discussion if patient does not improve with the intervention above. -Palliative medicine following.   Morbid obesity Body mass index is 46.32 kg/m. Nutrition Problem: Increased nutrient needs Etiology: wound healing Signs/Symptoms: estimated needs Interventions: MVI, Prostat, Glucerna shake    DVT prophylaxis:  enoxaparin (LOVENOX) injection 40 mg Start: 01/08/23 2200  Code Status: DNR/DNI Family Communication: Updated patient's sister, Christine [name] over the phone Level of care: Med-Surg Status is: Inpatient Remains inpatient appropriate because: Failure to thrive, encephalopathy and SIRS   Final disposition: SNF Consultants:  GI Psychiatry General Surgery Palliative medicine  55 minutes with more than 50% spent in reviewing records, counseling patient/family and coordinating care.   Sch Meds:  Scheduled Meds:  acetaminophen  650 mg Per Tube Q6H   antiseptic oral rinse  15 mL Mouth Rinse BID   Chlorhexidine Gluconate Cloth  6 each Topical Daily   cholecalciferol  5,000 Units Per Tube Daily   colchicine  0.6 mg Per Tube Daily   docusate  100 mg Per Tube BID   DULoxetine  60 mg Oral Daily   enoxaparin (LOVENOX) injection  40 mg Subcutaneous Q24H   feeding supplement (GLUCERNA SHAKE)  237 mL Oral TID BM   feeding supplement (PROSource TF20)  60 mL Per Tube Daily   ferrous sulfate  325 mg Oral Q breakfast   folic acid  1 mg Per Tube Daily   free water  100 mL Per Tube Q8H   Gerhardt's butt cream   Topical BID   mirtazapine  7.5 mg Per Tube QHS   multivitamin with minerals  1 tablet Per Tube BID   mouth rinse  15 mL Mouth Rinse TID   oxybutynin  15 mg Oral QHS   pantoprazole (PROTONIX) IV  40 mg Intravenous Q12H   sodium bicarbonate  1,300 mg Per Tube TID   sodium chloride flush  10-40 mL Intracatheter Q12H   [START ON 01/29/2023] thiamine  100 mg Oral Daily    Continuous Infusions:  feeding supplement (OSMOLITE 1.5 CAL) 40 mL/hr at 01/28/23 0645   thiamine (VITAMIN B1) injection 500 mg (01/28/23 0528)   PRN Meds:.acetaminophen **OR** acetaminophen, diclofenac Sodium, ondansetron **OR** ondansetron (ZOFRAN) IV, oxyCODONE, oxyCODONE, simethicone, sodium chloride flush  Antimicrobials: Anti-infectives (From admission, onward)    Start     Dose/Rate Route Frequency Ordered Stop   01/25/23 1309  ceFAZolin (ANCEF) IVPB 2g/100 mL premix        2 g 200 mL/hr over 30 Minutes Intravenous 30 min pre-op 01/25/23 1309 01/25/23 1745   01/20/23 1430  meropenem (MERREM) 1 g in sodium chloride 0.9 % 100 mL IVPB  Status:  Discontinued        1 g 200 mL/hr over 30 Minutes Intravenous Every 8 hours 01/20/23 1338 01/24/23 0801   01/19/23 1700  meropenem (MERREM) 1 g in sodium chloride 0.9 % 100 mL IVPB  Status:  Discontinued        1 g 200 mL/hr over 30 Minutes Intravenous Every 12 hours 01/19/23 1345 01/20/23 1338   01/17/23 1515  meropenem (MERREM) 1 g in sodium chloride 0.9 % 100 mL IVPB  Status:  Discontinued        1 g 200 mL/hr over 30 Minutes Intravenous Every 8 hours 01/17/23 1428 01/19/23 1345   01/16/23 1400  piperacillin-tazobactam (ZOSYN) IVPB 3.375 g  Status:  Discontinued        3.375 g 12.5 mL/hr over 240 Minutes Intravenous Every 8 hours 01/16/23 1322 01/17/23 1428   01/14/23 1000  cefTRIAXone (ROCEPHIN) 1 g in sodium chloride 0.9 % 100 mL IVPB  Status:  Discontinued        1 g 200 mL/hr over 30 Minutes Intravenous Every 24 hours 01/14/23 0925 01/16/23 1322   01/09/23 0930  cefTRIAXone (ROCEPHIN) 1 g in sodium chloride 0.9 % 100 mL IVPB        1 g 200 mL/hr over 30 Minutes Intravenous Every 24 hours 01/09/23 0840 01/11/23 1253        I have personally reviewed the following labs and images: CBC: Recent Labs  Lab 01/23/23 0402 01/25/23 0308 01/25/23 1829 01/26/23 0407 01/27/23 0435 01/28/23 0913  WBC 5.5 6.4  --  5.5 6.0 18.2*   HGB 8.6* 8.5* 8.8* 8.7* 8.0* 8.8*  HCT 26.7* 26.6* 26.0* 27.9* 24.8* 27.7*  MCV 85.6 85.3  --  85.3 84.1 83.9  PLT 310 291  --  262 238 216   BMP &GFR Recent Labs  Lab 01/23/23 0402 01/24/23 0504 01/25/23 0308 01/25/23 1829 01/26/23 0407 01/27/23 0435 01/28/23 0320 01/28/23 0913  NA 138 139 135 138 133* 129*  --  132*  K 3.6 3.8 3.6 2.5* 3.9 3.3*  --  4.4  CL 114* 117* 113*  --  111 105  --  105  CO2 20* 18* 19*  --  16* 17*  --  14*  GLUCOSE 80 76 83  --  142* 118*  --  111*  BUN 13 12 9   --  7* 13  --  17  CREATININE 0.83 1.09* 0.81  --  0.83 1.09*  --  0.98  CALCIUM 8.0* 8.1* 7.9*  --  8.4* 7.9*  --  8.0*  MG 1.7  --  1.8  --  1.6* 2.4 2.0  --   PHOS 2.7  --   --   --  3.3 2.5  --   --    Estimated Creatinine Clearance: 62.6 mL/min (by C-G formula based on SCr of 0.98 mg/dL). Liver & Pancreas: Recent Labs  Lab 01/25/23 0308 01/26/23 0407 01/27/23 0435 01/28/23 0913  AST 18 56* 31 22  ALT 15 26 19 18   ALKPHOS 84 93 96 130*  BILITOT 0.5 0.5 0.7 0.5  PROT 4.7* 5.1* 4.9* 5.1*  ALBUMIN 1.8* 2.3* 2.1* 1.9*   No results for input(s): "LIPASE", "AMYLASE" in the last 168 hours. No results for input(s): "AMMONIA" in the last 168 hours. Diabetic: No results for input(s): "HGBA1C" in the last 72 hours. Recent Labs  Lab 01/27/23 1703 01/27/23 2200 01/28/23 0821 01/28/23 1149 01/28/23 1256  GLUCAP 108* 88 90 65* 117*   Cardiac Enzymes: No results for input(s): "CKTOTAL", "CKMB", "CKMBINDEX", "TROPONINI" in the last 168 hours. No results for input(s): "PROBNP" in the last 8760 hours. Coagulation Profile: No results for input(s): "INR", "PROTIME" in the last 168 hours. Thyroid Function Tests: No results for input(s): "TSH", "T4TOTAL", "FREET4", "T3FREE", "THYROIDAB" in the last 72 hours. Lipid Profile: No results for input(s): "CHOL", "HDL", "LDLCALC", "TRIG", "CHOLHDL", "LDLDIRECT" in the last 72 hours. Anemia Panel: No results for input(s): "VITAMINB12",  "FOLATE", "FERRITIN", "TIBC", "IRON", "RETICCTPCT" in the last 72 hours. Urine analysis:    Component Value Date/Time   COLORURINE AMBER (A) 01/14/2023 0630   APPEARANCEUR CLOUDY (A) 01/14/2023 0630   LABSPEC 1.012 01/14/2023 0630   PHURINE 5.0 01/14/2023 0630   GLUCOSEU NEGATIVE 01/14/2023 0630   HGBUR MODERATE (A) 01/14/2023 0630   BILIRUBINUR NEGATIVE 01/14/2023 0630   KETONESUR NEGATIVE 01/14/2023 0630   PROTEINUR 30 (A) 01/14/2023 0630   NITRITE POSITIVE (A) 01/14/2023 0630   LEUKOCYTESUR LARGE (A) 01/14/2023 0630   Sepsis Labs: Invalid input(s): "PROCALCITONIN", "LACTICIDVEN"  Microbiology: No results found for this or any previous visit (from the past 240 hour(s)).  Radiology Studies: No results found.    Marketia Stallsmith T. Kaari Zeigler [facility]  If 7PM-7AM, please contact night-coverage [email or web address] 01/28/2023, 1:13 PM

## 2023-01-28 NOTE — TOC Progression Note (Signed)
Transition of Care [facility]) - Progression Note    Patient Details  Name: Christine [name] MRN: [id number] Date of Birth: 1951/01/18  [facility]) CM/SW Contact  Carley Hammed, LCSW Phone Number: 01/28/2023, 2:29 PM  Clinical Narrative:    Authorization continues to pend for [facility] Facility initiated auth due to portal being down. CSW was advised by medical team that she was not very interactive today. TOC will continue to follow for DC needs.    Expected Discharge Plan: Skilled Nursing Facility Barriers to Discharge: Continued Medical Work up  Expected Discharge Plan and Services In-house Referral: Clinical Social Work Discharge Planning Services: CM Consult Post Acute Care Choice: Skilled Nursing Facility Living arrangements for the past 2 months: Skilled Nursing Facility                                       Social Determinants of Health (SDOH) Interventions SDOH Screenings   Food Insecurity: No Food Insecurity (01/18/2023)  Housing: Patient Declined (01/18/2023)  Transportation Needs: No Transportation Needs (01/18/2023)  Utilities: Not At Risk (01/18/2023)  Alcohol Screen: Low Risk  (07/13/2022)  Depression (PHQ2-9): Low Risk  (01/21/2022)  Financial Resource Strain: Low Risk  (12/01/2022)   Received from [facility]  Physical Activity: Insufficiently Active (07/13/2022)  Social Connections: Moderately Integrated (12/01/2022)   Received from [facility]  Stress: No Stress Concern Present (07/13/2022)  Tobacco Use: Medium Risk (01/25/2023)  Health Literacy: Medium Risk (12/01/2022)   Received from [facility]    Readmission Risk Interventions    01/09/2023   11:11 AM  Readmission Risk Prevention Plan  Transportation Screening Complete  HRI or Home Care Consult Complete  Social Work Consult for Recovery Care Planning/Counseling Complete  Palliative Care Screening Not Applicable  Medication Review Oceanographer) Complete

## 2023-01-28 NOTE — Progress Notes (Signed)
OT Cancellation Note  Patient Details Name: Rollande Thursby MRN: [id number] DOB: 10-19-1950   Cancelled Treatment:    Reason Eval/Treat Not Completed: Fatigue/lethargy limiting ability to participate (patient supine in bed and will say "good morning" but otherwise keeps eyes closed and will not otherwise interact to complete functional OT treatment session)  Denice Paradise 01/28/2023, 1:16 PM

## 2023-01-28 NOTE — Progress Notes (Signed)
Pt place on air mattress. TF resume at 40 mL and call button in reach. UA has been sent down for collection via in and out catheter.

## 2023-01-28 NOTE — Consult Note (Signed)
[name] Psychiatry Followup Face-to-Face Psychiatric Evaluation   Service Date: January 28, 2023 LOS:  LOS: 20 days    Assessment  Christine [name] is a 72 y.o. female admitted medically for 01/08/2023 11:43 AM for Hypernatremia. 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 Psychiatry was consulted for concern about self harm via refusal to eat by Ivar Drape.      Her current presentation of lack of appetite and anhedonia is most consistent with MDD. She meets criteria for MDD based on history taken during assessment.  Current outpatient psychotropic medications include cymbalta and historically she has had a favorable response to these medications. She was  compliant with medications prior to admission as evidenced by patient endorsing so, and continue f/u with outpatient PCP documenting this. On initial examination, patient is non chalant about not eating, but endorses that she knows her decreased appetite is unhealthy. Patient endorses the last 4 months (since Roux- en Y) she has been feeling "lost" and endorses decreased concentration, decrease appetite, and anhedonia. Patient also endorses isolating behaviors and psychomotor retardation. Patient's sister also endorsed patient has been complaining about decreased sleep more the last few months as well.   The patient, who underwent gastric bypass surgery, is experiencing significant challenges related to appetite and mood, necessitating the placement of a feeding tube due to poor appetite and subsequent weight loss. Psychiatry has been consulted due to suspected depression, evidenced by isolation and a depressed mood lasting over two weeks. The situation appears multifactorial, with potential changes affecting appetite regulation following  surgery. The current treatment plan involves mirtazapine to address both depression and appetite stimulation. If there is no improvement with mirtazapine, adding Periactin (cyproheptadine) may be considered as an appetite stimulant. It's essential to note that the full effects of antidepressants may take 4-6 weeks to manifest, so close monitoring is necessary. In addition to pharmacological management, the patient will benefit from regular therapy sessions to address mood and coping strategies, as well as involvement from a dietitian to create a tailored nutritional plan that accommodates the patient's surgical history and current feeding tube. If there is no significant progress in mood or appetite, reconsult inpatient psychiatry is recommended. This comprehensive approach will help address the intertwined issues of physical and mental health in the patient's recovery process.  Diagnoses:  Active Hospital problems: Principal Problem:   Hypernatremia Active Problems:   Gastro-esophageal reflux disease with esophagitis   Essential hypertension   Osteoarthrosis   OSA on CPAP   GAD (generalized anxiety disorder)   Mixed incontinence urge and stress   Morbid obesity (HCC)   Abdominal pain   Gout   Gastroesophageal reflux disease   Prediabetes   Memory difficulties   Hyperlipidemia   Acute metabolic encephalopathy   Severe dehydration   Altered mental status   Failure to thrive in adult   AKI (acute kidney injury) (HCC)   Gallbladder dilatation   Esophageal dysphagia   Abnormal esophagram   Hypoglycemia     Plan  ## Safety and Observation Level:  - Based on my clinical evaluation, I estimate the patient to be at low risk of self harm in the current setting - At this time, we recommend a routine level of observation. This decision is based on my review of the chart including patient's history  and current presentation, interview of the patient, mental status examination, and consideration  of suicide risk including evaluating suicidal ideation, plan, intent, suicidal or self-harm behaviors, risk factors, and protective factors. This judgment is based on our ability to directly address suicide risk, implement suicide prevention strategies and develop a safety plan while the patient is in the clinical setting. Please contact our team if there is a concern that risk level has changed.     ## Medications:  MDD Failure to thrive Will restart Thiamine 100mg  tablet daily after high dose thiamine completed  -- Continue Cymbalta 60mg  daily(room to increase if no improvement). -- continue Remeron 7.5mg  at bedtime, may increase to 15mg .  - continue high dose thiamine protocol : thaimine 500mg  IV q8h for 3 days   ## Medical Decision Making Capacity:  Not formally assessed   ## Further Work-up:  -- New hyponatremia 10/3 -- Per Primary and surgery team At this time do not recommend Inpatient psychiatry as patient's primary problem is a failure to thrive post gastric bypass, and now has a G tube this is best managed in a medical focused setting. Patient co-occurring MDD and hx of chronic anxiety can receive continued treatment in an outpatient capacity.   -- most recent EKG on 01/16/2023 had QtC of 419 w/ HR 106 -- Pertinent labwork reviewed earlier this admission includes:     Latest Ref Rng & Units 01/28/2023    9:13 AM 01/27/2023    4:35 AM 01/26/2023    4:07 AM  CMP  Glucose 70 - 99 mg/dL 295  284  132   BUN 8 - 23 mg/dL 17  13  7    Creatinine 0.44 - 1.00 mg/dL 4.40  1.02  7.25   Sodium 135 - 145 mmol/L 132  129  133   Potassium 3.5 - 5.1 mmol/L 4.4  3.3  3.9   Chloride 98 - 111 mmol/L 105  105  111   CO2 22 - 32 mmol/L 14  17  16    Calcium 8.9 - 10.3 mg/dL 8.0  7.9  8.4   Total Protein 6.5 - 8.1 g/dL 5.1  4.9  5.1   Total Bilirubin 0.3 - 1.2 mg/dL 0.5  0.7  0.5   Alkaline Phos 38 - 126 U/L 130  96  93   AST 15 - 41 U/L 22  31  56   ALT 0 - 44 U/L 18  19  26       ##  Disposition:  -- Per primary  ## Behavioral / Environmental:  -- DELIRIUM RECS 1: Avoid benzodiazepines, antihistamines, anticholinergics, and minimize opiate use as these may worsen delirium. 2:Assess, prevent and manage pain as lack of treatment can result in delirium.  3: Recommend consult to PT/OT if not already done. Early mobility and exercise has been shown to decrease duration of delirium.  4:Provide appropriate lighting and clear signage; a clock and calendar should be easily visible to the patient. 5:Monitor environmental factors. Reduce light and noise at night (close shades, turn off lights, turn off TV, ect). Correct any alterations in sleep cycle. 6: Reorient the patient to person, place, time and situation on each encounter.  7: Correct sensory deficits if possible (replace eye glasses, hearing aids, ect). 8: Avoid restraints. Severely delirious patients benefit from constant observation by a sitter. 9: Do not leave patient unattended.     ##Legal Status   Thank you for this consult request. Recommendations have been communicated to the primary team.  We will sign off at this time.   Maryagnes Amos, FNP    followup history  Relevant Aspects of Hospital Course:  Christine [name] is a 72 y.o. female admitted medically for 01/08/2023 11:43 AM for Hypernatremia. 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 Psychiatry was consulted for concern about self harm via refusal to eat by Ivar Drape.     Patient Report:  Today upon evaluation patient is appropriate and alert. She does not open her eyes but she responds appropriately. She appears to be engaging well with staff, sister and Clinical research associate. She received a phone call from her sister during initial evaluation.  She is looking  forward to moving to [place] with her sister. She continues to report poor appetite and not eating well to include breakfast. Overall patient reports some improvement of symptoms at this time as evident by her interaction with team. Although she continues to report low depression and anxiety rating, her presentation does not seem to be much improved. She currently rates her depression and anxiety 2/10 with 10 being the worse.   She denies any suicidal thoughts, homicidal thoughts, and or auditory visual hallucinations.  She does not appear to be responding to internal stimuli.     Collateral information:  10/1: Sister reports that more recently the patient has been making more plans for activities. She appears to be thinking about the future.    Sister reports that she does think the patient is lonely as their family is mostly in [place], and it is harder to get down to see her. Patient also lives alone. Sister has to keep reminding patient that she is back in [place] and left [place] 2 weeks ago. But her brother came yesterday.    Sister reports that their brother came in yesterday, and she had ordered crab legs from a grocery store online. She never ate them, the family ended up throwing them away. They are not sure she notified her medical team about her order for crab legs, but they know that she really likes Crab legs. RN later confirmed that 9/29 patient had crab labs and was nibbling on them all day 9/30, before staff was able to convince her to throw them away due to them sitting out. They were never put in a fridge.    Psychiatric History:  Information collected from patient   INPT: denies OPT: denies, but PCP dx with GAD and started Cymbalta 60mg  daily Therapy: denies, but endorses that she did get psychology clearance prior to her surgery   Family psych history: Unknown     Social History:  Lives alone     Family History:  The patient's family history includes Alcohol abuse  in her brother; Arthritis in her mother and sister; Asthma in her father and mother; Diabetes in her father, mother, sister, and sister; Drug abuse in her brother; Heart disease in her father and mother; High blood pressure in her father and mother; Varicose Veins in her sister.  Medical History: Past Medical History:  Diagnosis Date   Allergy    dust, pollen, sulfa, prednisone   Anemia    Anxiety    Arthritis    "knees" (04/26/2016)   Colon polyps    benign per pt   Coronary artery disease    mild per 2015  cath in [place] (OM1 30%, RCA 30%)   GERD (gastroesophageal reflux disease)    Gout    History of hiatal hernia    Hyperlipidemia 05/20/2021   Hypertension    Migraine    "none since early /2017" (05/06/2016)   Obesity    OSA on CPAP    uses CPAP   Pre-diabetes    Pre-operative cardiovascular examination 08/22/2008   Renal insufficiency 11/09/2022   Vasculitis (HCC)    Bilateral    Surgical History: Past Surgical History:  Procedure Laterality Date   ABDOMINAL HYSTERECTOMY     "partial"; both ovaries present   APPENDECTOMY  05/06/2016   BIOPSY  04/08/2022   Procedure: BIOPSY;  Surgeon: Sherrilyn Rist, MD;  Location: [facility];  Service: Gastroenterology;;   CARDIAC CATHETERIZATION  ~ 2015   CARDIAC CATHETERIZATION  2015   In [place]   CHILECTOMY Right 06/01/2017   Procedure: CHILECTOMY RIGHT FOOT;  Surgeon: Felecia Shelling, DPM;  Location: [facility];  Service: Podiatry;  Laterality: Right;   CHOLECYSTECTOMY N/A 01/25/2023   Procedure: LAPAROSCOPIC CHOLECYSTECTOMY WITH INTRAOPERATIVE CHOLANGIOGRAM;  Surgeon: Quentin Ore, MD;  Location: [facility];  Service: General;  Laterality: N/A;   ESOPHAGOGASTRODUODENOSCOPY N/A 01/25/2023   Procedure: ESOPHAGOGASTRODUODENOSCOPY (EGD);  Surgeon: Quentin Ore, MD;  Location: [facility];  Service: General;  Laterality: N/A;   ESOPHAGOGASTRODUODENOSCOPY (EGD) WITH PROPOFOL N/A 04/08/2022   Procedure:  ESOPHAGOGASTRODUODENOSCOPY (EGD) WITH PROPOFOL;  Surgeon: Sherrilyn Rist, MD;  Location: [facility];  Service: Gastroenterology;  Laterality: N/A;   LAPAROSCOPIC APPENDECTOMY N/A 05/06/2016   Procedure: LAPAROSCOPIC APPENDECTOMY;  Surgeon: Manus Rudd, MD;  Location: [facility];  Service: General;  Laterality: N/A;   LAPAROSCOPIC INSERTION GASTROSTOMY TUBE N/A 01/25/2023   Procedure: LAPAROSCOPIC INSERTION REMNANT GASTROSTOMY TUBE;  Surgeon: Quentin Ore, MD;  Location: [facility];  Service: General;  Laterality: N/A;   POLYPECTOMY  04/08/2022   Procedure: POLYPECTOMY;  Surgeon: Sherrilyn Rist, MD;  Location: [facility];  Service: Gastroenterology;;   TONSILLECTOMY      Medications:   Current Facility-Administered Medications:    acetaminophen (TYLENOL) tablet 650 mg, 650 mg, Per Tube, Q6H PRN, Alanda Slim, Taye T, MD   antiseptic oral rinse (BIOTENE) solution 15 mL, 15 mL, Mouth Rinse, BID, Stechschulte, Hyman Hopes, MD, 15 mL at 01/26/23 2329   Chlorhexidine Gluconate Cloth 2 % PADS 6 each, 6 each, Topical, Daily, Stechschulte, Hyman Hopes, MD, 6 each at 01/27/23 1006   cholecalciferol (VITAMIN D3) 25 MCG (1000 UNIT) tablet 5,000 Units, 5,000 Units, Per Tube, Daily, Leatha Gilding, MD, 5,000 Units at 01/28/23 1101   colchicine tablet 0.6 mg, 0.6 mg, Per Tube, Daily, Leatha Gilding, MD, 0.6 mg at 01/28/23 1100   diclofenac Sodium (VOLTAREN) 1 % topical gel 2 g, 2 g, Topical, QID PRN, Stechschulte, Hyman Hopes, MD   docusate (COLACE) 50 MG/5ML liquid 100 mg, 100 mg, Per Tube, BID, Elvera Lennox, Costin M, MD, 100 mg at 01/28/23 1059   DULoxetine (CYMBALTA) DR capsule 60 mg, 60 mg, Oral, Daily, Stechschulte, Hyman Hopes, MD, 60 mg at 01/28/23 1101   enoxaparin (LOVENOX) injection 40 mg, 40 mg, Subcutaneous, Q24H, Stechschulte, Hyman Hopes, MD, 40 mg at 01/27/23 2156   feeding supplement (GLUCERNA SHAKE) (GLUCERNA SHAKE) liquid 237 mL, 237 mL, Oral, TID BM, Gherghe, Costin M, MD, 237 mL at 01/28/23 1347   feeding  supplement (OSMOLITE 1.5 CAL) liquid 1,000 mL, 1,000 mL, Per Tube, Continuous, Stechschulte, Hyman Hopes, MD, Last  Rate: 40 mL/hr at 01/28/23 0645, Infusion Verify at 01/28/23 0645   feeding supplement (PROSource TF20) liquid 60 mL, 60 mL, Per Tube, Daily, Stechschulte, Hyman Hopes, MD, 60 mL at 01/28/23 1059   ferrous sulfate tablet 325 mg, 325 mg, Oral, Q breakfast, Stechschulte, Hyman Hopes, MD, 325 mg at 01/28/23 0907   folic acid (FOLVITE) tablet 1 mg, 1 mg, Per Tube, Daily, Leander Rams, RPH, 1 mg at 01/28/23 1101   free water 100 mL, 100 mL, Per Tube, Q8H, Gonfa, Taye T, MD, 100 mL at 01/28/23 1344   Gerhardt's butt cream, , Topical, BID, Stechschulte, Hyman Hopes, MD, Given at 01/28/23 1102   mirtazapine (REMERON SOL-TAB) disintegrating tablet 7.5 mg, 7.5 mg, Per Tube, QHS, Leatha Gilding, MD, 7.5 mg at 01/27/23 2154   multivitamin with minerals tablet 1 tablet, 1 tablet, Per Tube, BID, Leatha Gilding, MD, 1 tablet at 01/28/23 1101   ondansetron (ZOFRAN) tablet 4 mg, 4 mg, Per Tube, Q6H PRN **OR** ondansetron (ZOFRAN) injection 4 mg, 4 mg, Intravenous, Q6H PRN, Leander Rams, [facility]   Oral care mouth rinse, 15 mL, Mouth Rinse, TID, Stechschulte, Hyman Hopes, MD, 15 mL at 01/27/23 2155   oxybutynin (DITROPAN-XL) 24 hr tablet 15 mg, 15 mg, Oral, QHS, Gherghe, Daylene Katayama, MD, 15 mg at 01/27/23 2154   oxyCODONE (Oxy IR/ROXICODONE) immediate release tablet 5 mg, 5 mg, Per Tube, Q6H PRN, Alanda Slim, Taye T, MD   pantoprazole (PROTONIX) injection 40 mg, 40 mg, Intravenous, Q12H, Stechschulte, Hyman Hopes, MD, 40 mg at 01/28/23 1100   simethicone (MYLICON) 40 MG/0.6ML suspension 80 mg, 80 mg, Per Tube, QID PRN, Stechschulte, Hyman Hopes, MD   sodium bicarbonate tablet 1,300 mg, 1,300 mg, Per Tube, TID, Gonfa, Taye T, MD   sodium chloride flush (NS) 0.9 % injection 10-40 mL, 10-40 mL, Intracatheter, Q12H, Stechschulte, Hyman Hopes, MD, 10 mL at 01/28/23 1104   sodium chloride flush (NS) 0.9 % injection 10-40 mL, 10-40 mL, Intracatheter, PRN,  Stechschulte, Hyman Hopes, MD, 10 mL at 01/26/23 2316   thiamine (VITAMIN B1) 500 mg in sodium chloride 0.9 % 50 mL IVPB, 500 mg, Intravenous, Q8H, Gherghe, Costin M, MD, Last Rate: 100 mL/hr at 01/28/23 1346, 500 mg at 01/28/23 1346   [START ON 01/29/2023] thiamine (VITAMIN B1) tablet 100 mg, 100 mg, Oral, Daily, Bobbye Morton, MD  Allergies: Allergies  Allergen Reactions   Prednisone Swelling    Legs swell    Tape Hives   Sulfa Antibiotics Rash       Objective  Vital signs:  Temp:  [98.3 F (36.8 C)-100.2 F (37.9 C)] 98.3 F (36.8 C) (10/04 1137) Pulse Rate:  [100-117] 109 (10/04 1137) Resp:  [18-20] 18 (10/04 1137) BP: (117-139)/(73-78) 125/78 (10/04 1137) SpO2:  [99 %-100 %] 100 % (10/04 1137) Weight:  [111.2 kg] 111.2 kg (10/04 0500)  Psychiatric Specialty Exam:  Presentation  General Appearance:  Appropriate for Environment; Casual  Eye Contact: Minimal  Speech: Clear and Coherent; Slow  Speech Volume: Decreased  Handedness:Right   Mood and Affect  Mood: Euthymic  Affect: Flat   Thought Process  Thought Processes: Goal Directed  Descriptions of Associations:Intact  Orientation:Full (Time, Place and Person)  Thought Content:Logical  History of Schizophrenia/Schizoaffective disorder:No data recorded Duration of Psychotic Symptoms:No data recorded Hallucinations:Hallucinations: None  Ideas of Reference:None  Suicidal Thoughts:Suicidal Thoughts: No  Homicidal Thoughts:Homicidal Thoughts: No   Sensorium  Memory: Immediate Fair; Recent Fair; Remote Fair  Judgment: Fair  Insight:  Fair   Art therapist  Concentration: Fair  Attention Span: Poor  Recall: [employer] of Knowledge: Fair  Language: Fair   Psychomotor Activity  Psychomotor Activity:Psychomotor Activity: Psychomotor Retardation   Assets  Assets: Desire for Improvement; Social Support; Housing; Communication Skills   Sleep  Sleep:Sleep:  Good    Physical Exam: Physical Exam Constitutional:      Appearance: She is obese.  HENT:     Head: Normocephalic and atraumatic.  Neurological:     General: No focal deficit present.     Mental Status: She is alert and oriented to person, place, and time. Mental status is at baseline.  Psychiatric:        Mood and Affect: Mood normal.        Behavior: Behavior normal.        Thought Content: Thought content normal.        Judgment: Judgment normal.    Review of Systems  Psychiatric/Behavioral:  Negative for hallucinations and suicidal ideas. The patient does not have insomnia.    Blood pressure 125/78, pulse (!) 109, temperature 98.3 F (36.8 C), resp. rate 18, height 5\' 1"  (1.549 m), weight 111.2 kg, SpO2 100%. Body mass index is 46.32 kg/m.

## 2023-01-28 NOTE — Plan of Care (Signed)
  Problem: Nutritional: Goal: Nutritional status will improve Outcome: Not Progressing   Problem: Respiratory: Goal: Will regain and/or maintain adequate ventilation Outcome: Progressing   Problem: Clinical Measurements: Goal: Respiratory complications will improve Outcome: Progressing   Problem: Elimination: Goal: Will not experience complications related to bowel motility Outcome: Progressing

## 2023-01-28 NOTE — Progress Notes (Signed)
3 Days Post-Op  Subjective: Resting in bed.  Not very talkative today  Objective: Vital signs in last 24 hours: Temp:  [98.7 F (37.1 C)-100.2 F (37.9 C)] 100.2 F (37.9 C) (10/04 0912) Pulse Rate:  [100-117] 117 (10/04 0912) Resp:  [20] 20 (10/04 0615) BP: (117-139)/(73-76) 139/76 (10/04 0912) SpO2:  [99 %-100 %] 100 % (10/04 0912) Weight:  [111.2 kg] 111.2 kg (10/04 0500) Last BM Date : 01/27/23  Intake/Output from previous day: 10/03 0701 - 10/04 0700 In: 819.7 [NG/GT:819.7] Out: 700 [Urine:700] Intake/Output this shift: Total I/O In: 100 [Other:100] Out: -   PE: Gen:  Alert, NAD, pleasant Abd: Soft, ND, NT, +BS, incisions well healed. G tube functioning   Lab Results:  Recent Labs    01/27/23 0435 01/28/23 0913  WBC 6.0 18.2*  HGB 8.0* 8.8*  HCT 24.8* 27.7*  PLT 238 216   BMET Recent Labs    01/27/23 0435 01/28/23 0913  NA 129* 132*  K 3.3* 4.4  CL 105 105  CO2 17* 14*  GLUCOSE 118* 111*  BUN 13 17  CREATININE 1.09* 0.98  CALCIUM 7.9* 8.0*   PT/INR No results for input(s): "LABPROT", "INR" in the last 72 hours. CMP     Component Value Date/Time   NA 132 (L) 01/28/2023 0913   K 4.4 01/28/2023 0913   CL 105 01/28/2023 0913   CO2 14 (L) 01/28/2023 0913   GLUCOSE 111 (H) 01/28/2023 0913   BUN 17 01/28/2023 0913   CREATININE 0.98 01/28/2023 0913   CREATININE 0.91 07/26/2017 1127   CALCIUM 8.0 (L) 01/28/2023 0913   PROT 5.1 (L) 01/28/2023 0913   ALBUMIN 1.9 (L) 01/28/2023 0913   AST 22 01/28/2023 0913   AST 15 07/26/2017 1127   ALT 18 01/28/2023 0913   ALT 14 07/26/2017 1127   ALKPHOS 130 (H) 01/28/2023 0913   BILITOT 0.5 01/28/2023 0913   BILITOT 0.5 07/26/2017 1127   GFRNONAA >60 01/28/2023 0913   GFRNONAA >60 07/26/2017 1127   GFRAA >60 02/28/2019 1022   GFRAA >60 07/26/2017 1127   Lipase     Component Value Date/Time   LIPASE 22 11/08/2022 2335    Studies/Results: No results found.  Anti-infectives: Anti-infectives  (From admission, onward)    Start     Dose/Rate Route Frequency Ordered Stop   01/25/23 1309  ceFAZolin (ANCEF) IVPB 2g/100 mL premix        2 g 200 mL/hr over 30 Minutes Intravenous 30 min pre-op 01/25/23 1309 01/25/23 1745   01/20/23 1430  meropenem (MERREM) 1 g in sodium chloride 0.9 % 100 mL IVPB  Status:  Discontinued        1 g 200 mL/hr over 30 Minutes Intravenous Every 8 hours 01/20/23 1338 01/24/23 0801   01/19/23 1700  meropenem (MERREM) 1 g in sodium chloride 0.9 % 100 mL IVPB  Status:  Discontinued        1 g 200 mL/hr over 30 Minutes Intravenous Every 12 hours 01/19/23 1345 01/20/23 1338   01/17/23 1515  meropenem (MERREM) 1 g in sodium chloride 0.9 % 100 mL IVPB  Status:  Discontinued        1 g 200 mL/hr over 30 Minutes Intravenous Every 8 hours 01/17/23 1428 01/19/23 1345   01/16/23 1400  piperacillin-tazobactam (ZOSYN) IVPB 3.375 g  Status:  Discontinued        3.375 g 12.5 mL/hr over 240 Minutes Intravenous Every 8 hours 01/16/23 1322 01/17/23 1428  01/14/23 1000  cefTRIAXone (ROCEPHIN) 1 g in sodium chloride 0.9 % 100 mL IVPB  Status:  Discontinued        1 g 200 mL/hr over 30 Minutes Intravenous Every 24 hours 01/14/23 0925 01/16/23 1322   01/09/23 0930  cefTRIAXone (ROCEPHIN) 1 g in sodium chloride 0.9 % 100 mL IVPB        1 g 200 mL/hr over 30 Minutes Intravenous Every 24 hours 01/09/23 0840 01/11/23 1253      CT 9/14 - Distended gallbladder. If there is concern of gallbladder pathology ultrasound may be useful as the next step in the workup. - Grossly stable left-sided renal cystic foci. - Surgical changes of previous gastric bypass - Markedly limited examination due to the level of motion. No obvious obstruction, free air or free fluid. In addition, a repeat study could be considered when the patient is more clinically able.  RUQ [place] 9/15 1. Hepatic fatty infiltration. 2. Gallbladder distended. 3. Minimal gallbladder sludge. 4. Echogenic kidneys  consistent with chronic medical renal disease. 5. Left kidney cyst. 6. There was limited visualization, as described.  Esophagram 9/20  1. 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 Further evaluation with endoscopy is recommended. 2. Significant delay in swallow initiation/mechanism. Prominent cricopharyngeal bar. 3. Advanced esophageal dysmotility with spasms. 4. Limited study due to patient's body habitus and mobility limitations.   CT chest 9/21 1. No acute cardiopulmonary process (no obvious extrinsic esophageal mass) 2. Minimal atelectasis in the left lower lobe.  HIDA 9/23 1.  Patent cystic and common bile ducts. 2. Elevated gallbladder ejection fraction as can be seen with gallbladder hyperkinesis.  Intra-op IOC and EGD 01/25/23 normal   Assessment/Plan Christine [name] is a 72 y.o. female with hx of robotic gastric bypass on 09/14/2022 by Dr. Dossie Der on 09/14/22 who is here with FTT - Unsure etiology of mood and mentation issues postoperatively, may have been due to thiamine deficiency, change in antidepressant absorption after gastric bypass, UTI?   S/p lap chole, lap remnant g tube and normal EGD 01/25/23  - Tube feeds as tolerated, monitor electrolytes - Okay for diet as tolerated    - Patient will need safe discharge plan - Appreciate psych team input as she is demonstrating self harm by refusing to eat.  She will need connected to continued psychiatric support after discharge   I am out this weekend, but my partners covering the hospital are available if needed.  I think she is very close to ready for SNF.  I will check back in next week if she hasn't been discharged.   LOS: 20 days   Quentin Ore, MD [facility] Surgery 01/28/2023, 11:15 AM Please see Amion for pager number during day hours 7:00am-4:30pm

## 2023-01-28 NOTE — Plan of Care (Signed)
  Problem: Activity: Goal: Ability to tolerate increased activity will improve Outcome: Not Progressing   Problem: Bowel/Gastric: Goal: Gastrointestinal status for postoperative course will improve Outcome: Not Progressing   Problem: Nutrition: Goal: Adequate nutrition will be maintained Outcome: Not Progressing

## 2023-01-29 DIAGNOSIS — E870 Hyperosmolality and hypernatremia: Secondary | ICD-10-CM | POA: Diagnosis not present

## 2023-01-29 DIAGNOSIS — I10 Essential (primary) hypertension: Secondary | ICD-10-CM | POA: Diagnosis not present

## 2023-01-29 DIAGNOSIS — G9341 Metabolic encephalopathy: Secondary | ICD-10-CM | POA: Diagnosis not present

## 2023-01-29 DIAGNOSIS — R627 Adult failure to thrive: Secondary | ICD-10-CM | POA: Diagnosis not present

## 2023-01-29 DIAGNOSIS — L8990 Pressure ulcer of unspecified site, unspecified stage: Secondary | ICD-10-CM | POA: Insufficient documentation

## 2023-01-29 LAB — CBC WITH DIFFERENTIAL/PLATELET
Abs Immature Granulocytes: 0 10*3/uL (ref 0.00–0.07)
Basophils Absolute: 0.1 10*3/uL (ref 0.0–0.1)
Basophils Relative: 1 %
Eosinophils Absolute: 0 10*3/uL (ref 0.0–0.5)
Eosinophils Relative: 0 %
HCT: 22.8 % — ABNORMAL LOW (ref 36.0–46.0)
Hemoglobin: 7.5 g/dL — ABNORMAL LOW (ref 12.0–15.0)
Lymphocytes Relative: 10 %
Lymphs Abs: 1 10*3/uL (ref 0.7–4.0)
MCH: 26.9 pg (ref 26.0–34.0)
MCHC: 32.9 g/dL (ref 30.0–36.0)
MCV: 81.7 fL (ref 80.0–100.0)
Monocytes Absolute: 0.4 10*3/uL (ref 0.1–1.0)
Monocytes Relative: 4 %
Neutro Abs: 8.7 10*3/uL — ABNORMAL HIGH (ref 1.7–7.7)
Neutrophils Relative %: 85 %
Platelets: 216 10*3/uL (ref 150–400)
RBC: 2.79 MIL/uL — ABNORMAL LOW (ref 3.87–5.11)
RDW: 21.3 % — ABNORMAL HIGH (ref 11.5–15.5)
WBC: 10.2 10*3/uL (ref 4.0–10.5)
nRBC: 0 % (ref 0.0–0.2)
nRBC: 1 /100{WBCs} — ABNORMAL HIGH

## 2023-01-29 LAB — COMPREHENSIVE METABOLIC PANEL
ALT: 14 U/L (ref 0–44)
AST: 17 U/L (ref 15–41)
Albumin: 1.8 g/dL — ABNORMAL LOW (ref 3.5–5.0)
Alkaline Phosphatase: 146 U/L — ABNORMAL HIGH (ref 38–126)
Anion gap: 12 (ref 5–15)
BUN: 22 mg/dL (ref 8–23)
CO2: 16 mmol/L — ABNORMAL LOW (ref 22–32)
Calcium: 7.9 mg/dL — ABNORMAL LOW (ref 8.9–10.3)
Chloride: 106 mmol/L (ref 98–111)
Creatinine, Ser: 0.82 mg/dL (ref 0.44–1.00)
GFR, Estimated: >60 mL/min (ref >60)
Glucose, Bld: 118 mg/dL — ABNORMAL HIGH (ref 70–99)
Potassium: 4.1 mmol/L (ref 3.5–5.1)
Sodium: 134 mmol/L — ABNORMAL LOW (ref 135–145)
Total Bilirubin: 0.6 mg/dL (ref 0.3–1.2)
Total Protein: 5 g/dL — ABNORMAL LOW (ref 6.5–8.1)

## 2023-01-29 LAB — MAGNESIUM
Magnesium: 1.9 mg/dL (ref 1.7–2.4)
Magnesium: 1.8 mg/dL (ref 1.7–2.4)

## 2023-01-29 LAB — CREATININE, SERUM
Creatinine, Ser: 0.88 mg/dL (ref 0.44–1.00)
GFR, Estimated: >60 mL/min (ref >60)

## 2023-01-29 LAB — GLUCOSE, CAPILLARY
Glucose-Capillary: 86 mg/dL (ref 70–99)
Glucose-Capillary: 55 mg/dL — ABNORMAL LOW (ref 70–99)
Glucose-Capillary: 68 mg/dL — ABNORMAL LOW (ref 70–99)
Glucose-Capillary: 131 mg/dL — ABNORMAL HIGH (ref 70–99)
Glucose-Capillary: 99 mg/dL (ref 70–99)
Glucose-Capillary: 71 mg/dL (ref 70–99)
Glucose-Capillary: 74 mg/dL (ref 70–99)
Glucose-Capillary: 89 mg/dL (ref 70–99)

## 2023-01-29 LAB — PHOSPHORUS: Phosphorus: 1 mg/dL — CL (ref 2.5–4.6)

## 2023-01-29 LAB — RPR: RPR Ser Ql: NONREACTIVE

## 2023-01-29 MED ORDER — DEXTROSE 50 % IV SOLN
INTRAVENOUS | Status: AC
  Filled 2023-01-29: qty 50

## 2023-01-29 MED ORDER — SODIUM PHOSPHATES 45 MMOLE/15ML IV SOLN
30.0000 mmol | Freq: Once | INTRAVENOUS | Status: AC
  Administered 2023-01-29: 30 mmol via INTRAVENOUS
  Filled 2023-01-29: qty 10

## 2023-01-29 MED ORDER — DEXTROSE 50 % IV SOLN
12.5000 g | INTRAVENOUS | Status: AC
  Administered 2023-01-29: 12.5 g via INTRAVENOUS

## 2023-01-29 NOTE — Progress Notes (Signed)
Date and time results received: 01/29/23 1629 (use smartphrase ".now" to insert current time)  Test: Phosporus Critical Value: 1.0  Name of Provider Notified: Dr. Alanda Slim  Orders Received? Or Actions Taken?: Orders received.

## 2023-01-29 NOTE — Plan of Care (Signed)
  Problem: Nutritional: Goal: Nutritional status will improve Outcome: Progressing   Problem: Pain Management: Goal: Pain level will decrease Outcome: Progressing   Problem: Nutrition: Goal: Adequate nutrition will be maintained Outcome: Progressing   Problem: Coping: Goal: Level of anxiety will decrease Outcome: Progressing

## 2023-01-29 NOTE — Plan of Care (Signed)
  Problem: Skin Integrity: Goal: Demonstration of wound healing without infection will improve Outcome: Not Progressing   Problem: Pain Management: Goal: Pain level will decrease Outcome: Not Progressing   Problem: Nutrition: Goal: Adequate nutrition will be maintained Outcome: Not Progressing   Problem: Pain Managment: Goal: General experience of comfort will improve Outcome: Not Progressing

## 2023-01-29 NOTE — Progress Notes (Signed)
Noted urine output to be 100 mls tea colored urine, patient is dry, bladderv scan performed and noted to have 279 mls, informed Dr. Frederick Peers  and he ordered bladder scan q6 for now.

## 2023-01-29 NOTE — Progress Notes (Signed)
Blood sugar is 39/mg/ dl after pt took 2 sips of ginger ale, refused to have anymore after, activated hypoglycemic protocol, informed Dr. Frederick Peers, dextrose 50 given. Blood sugar rechecked after and noted to be at 108 mg/dl.

## 2023-01-29 NOTE — Progress Notes (Signed)
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 Has had multiple hospitalizations and ED visits in the past several months. Postoperative course complicated by very poor oral intake, weakness, currently residing in an SNF. She has refused to eat, drink, and take her medications. She was admitted to [facility] with confusion. GI was consulted and did not find an organic cause for her not eating. After discussing with the family, a feeding tube was recommended and she was transferred to [facility] for surgery consultation.   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 GI consulted, they do not appreciate this being an issue for which she could not eat.  After discussions with family, she was transferred to [facility] for surgical evaluation for gastrostomy tube.  She was evaluated by psychiatry.    Patient had PEG tube and laparoscopic cholecystectomy with IOC on 10/1.  Started on tube feed.   Subjective: Seen and examined earlier this morning.  Became hypoglycemic to 38 overnight.  Improved to 108 after D50.  CBG dropped to 55 this morning but improved to 131 after some juice.  She is more alert today.  She is oriented to self, "hospital", month and year but not date.  Follows commands.  Objective: Vitals:   01/28/23 1956 01/28/23 2339 01/29/23 0336 01/29/23 0844  BP: 122/70 115/82 110/71 (!)  118/56  Pulse: (!) 102 (!) 117 (!) 103 88  Resp: 20 20 18 15   Temp: (!) 97.5 F (36.4 C) 97.8 F (36.6 C) 98.1 F (36.7 C) 97.6 F (36.4 C)  TempSrc: Oral Oral Oral Oral  SpO2: 100% 100% 100%   Weight:      Height:        Examination:  GENERAL: No apparent distress.  Nontoxic. HEENT: MMM.  Vision and hearing grossly intact.  NECK: Supple.  No apparent JVD.  RESP:  No IWOB.  Fair aeration bilaterally. CVS:  RRR. Heart sounds normal.  ABD/GI/GU: BS+. Abd soft.  Nontender.  G-tube in place. MSK/EXT:  Moves extremities. No apparent deformity.  1+ BLE edema.  Venous insufficiency. SKIN: Stage II bilateral buttock wounds without signs of infection. NEURO: Awake.  More alert.  Oriented to self, "hospital", month, year but not date.  Follows commands.  No focal neurodeficit. PSYCH: Somnolent.  Procedures:  10/1-gastrostomy tube and laparoscopic cholecystectomy  Microbiology summarized: 9/14-COVID-19 PCR nonreactive 9/14-C. difficile negative 9/14-MRSA PCR nonreactive 9/14-GIP negative 9/20-urine culture with ESBL E. Coli 9/22-blood cultures NGTD 10/4-blood culture  Assessment and plan: Adult Failure to Thrive/Esophageal dysmotility, refusing to eat: Patient has no desire to eat.  Unclear if this is due to poor appetite, psychogenic or cognitive deficit.  Evaluated by psychiatry and refused additional meds for depressive symptoms earlier in the course.  Received high-dose thiamine from 9/23-9/24 and 10/2-10/5 -S/p laparoscopic gastrostomy and cholecystectomy with IOC on 10/1 -On continuous and bolus tube feed.  -Remeron was started on 10/2. -Continue home Cymbalta -Reconsulted dietitian  to see if we can increase tube feed given recurrent hypoglycemia -Regular diet  Acute metabolic encephalopathy: Initially thought to be due to severe hypernatremia.  VBG, ammonia, B12, TSH and RPR unrevealing.  Received high-dose thiamine as above.  Encephalopathy improved after stopping and  decreasing some sedating medications -Decreased oxycodone on 10/4 -Reorientation and delirium precautions.  SIRS: No clear source of infection yet.  CXR, UA and blood culture unrevealing.  She has bilateral buttock wound but no signs of infection.  No respiratory symptoms.  Hemodynamically stable. -Monitor off antibiotics.   Abdominal pain -9/14 CT abd--limited by motion, but  No obvious obstruction, free air or free fluid; s/p gastric bypass. 9/15.  Abd US showed distended gallbladder with sludge.  LFTs normal.  HIDA scan negative.  -S/p laparoscopic cholecystectomy with IOC on 10/1   Hypokalemia/Hypomagnesemia/Hypophosphatemia -due to FTT.   -Monitor and replace as indicated   AKI -baseline Cr ~0.8.  Creatinine slightly up.  Continue monitoring.  ESBL E. coli UTI: Completed 7 days of IV meropenem 09/29.    NAGMA: Stable. -Continue sodium bicarb per tube  Hypernatremia: Resolved.  Hyponatremia: Due to free water?  Improved. -Decreased free water on 10/3.  OSA -Nightly CPAP ordered    Essential hypertension   -Monitor  GERD -continue PPI  Bilateral buttock wounds: Stage II: POA -Wound care per WOC RN -Frequent turning   Goals of care-patient does not seem to be thriving despite nutrition and interventions.  I have discussed my concern with patient's Sister, [name] who lives in [place].  Patient does not have family members close by.  Palliative has already been involved early in the course.  I have told [name] that we may have to regroup for further goal of care discussion if patient does not improve with the intervention above. -Palliative medicine following.   Morbid obesity Body mass index is 46.32 kg/m. Nutrition Problem: Increased nutrient needs Etiology: wound healing Signs/Symptoms: estimated needs Interventions: MVI, Prostat, Glucerna shake    DVT prophylaxis:  enoxaparin (LOVENOX) injection 40 mg Start: 01/08/23 2200  Code Status: DNR/DNI Family  Communication: Updated patient's sister, [name] over the phone on 10/4. Level of care: Med-Surg Status is: Inpatient Remains inpatient appropriate because: Failure to thrive, encephalopathy and SIRS   Final disposition: SNF Consultants:  GI Psychiatry General Surgery Palliative medicine  35 minutes with more than 50% spent in reviewing records, counseling patient/family and coordinating care.   Sch Meds:  Scheduled Meds:  antiseptic oral rinse  15 mL Mouth Rinse BID   Chlorhexidine Gluconate Cloth  6 each Topical Daily   cholecalciferol  5,000 Units Per Tube Daily   colchicine  0.6 mg Per Tube Daily   dextrose       docusate  100 mg Per Tube BID   DULoxetine  60 mg Oral Daily   enoxaparin (LOVENOX) injection  40 mg Subcutaneous Q24H   feeding supplement (GLUCERNA SHAKE)  237 mL Oral TID BM   feeding supplement (JEVITY 1.5 CAL/FIBER)  1,000 mL Per Tube Q24H   feeding supplement (PROSource TF20)  60 mL Per Tube Daily   ferrous sulfate  325 mg Oral Q breakfast   folic acid  1 mg Per Tube Daily   free water  100 mL Per Tube Q8H   Gerhardt's butt cream   Topical BID   mirtazapine  7.5 mg Per Tube QHS   multivitamin with minerals  1 tablet Per Tube BID   mouth rinse  15 mL Mouth Rinse TID  oxybutynin  15 mg Oral QHS   pantoprazole (PROTONIX) IV  40 mg Intravenous Q12H   sodium bicarbonate  1,300 mg Per Tube TID   sodium chloride flush  10-40 mL Intracatheter Q12H   thiamine  100 mg Oral Daily   Continuous Infusions:   PRN Meds:.acetaminophen, dextrose, diclofenac Sodium, metoprolol tartrate, ondansetron **OR** ondansetron (ZOFRAN) IV, oxyCODONE, simethicone, sodium chloride flush  Antimicrobials: Anti-infectives (From admission, onward)    Start     Dose/Rate Route Frequency Ordered Stop   01/25/23 1309  ceFAZolin (ANCEF) IVPB 2g/100 mL premix        2 g 200 mL/hr over 30 Minutes Intravenous 30 min pre-op 01/25/23 1309 01/25/23 1745   01/20/23 1430  meropenem (MERREM)  1 g in sodium chloride 0.9 % 100 mL IVPB  Status:  Discontinued        1 g 200 mL/hr over 30 Minutes Intravenous Every 8 hours 01/20/23 1338 01/24/23 0801   01/19/23 1700  meropenem (MERREM) 1 g in sodium chloride 0.9 % 100 mL IVPB  Status:  Discontinued        1 g 200 mL/hr over 30 Minutes Intravenous Every 12 hours 01/19/23 1345 01/20/23 1338   01/17/23 1515  meropenem (MERREM) 1 g in sodium chloride 0.9 % 100 mL IVPB  Status:  Discontinued        1 g 200 mL/hr over 30 Minutes Intravenous Every 8 hours 01/17/23 1428 01/19/23 1345   01/16/23 1400  piperacillin-tazobactam (ZOSYN) IVPB 3.375 g  Status:  Discontinued        3.375 g 12.5 mL/hr over 240 Minutes Intravenous Every 8 hours 01/16/23 1322 01/17/23 1428   01/14/23 1000  cefTRIAXone (ROCEPHIN) 1 g in sodium chloride 0.9 % 100 mL IVPB  Status:  Discontinued        1 g 200 mL/hr over 30 Minutes Intravenous Every 24 hours 01/14/23 0925 01/16/23 1322   01/09/23 0930  cefTRIAXone (ROCEPHIN) 1 g in sodium chloride 0.9 % 100 mL IVPB        1 g 200 mL/hr over 30 Minutes Intravenous Every 24 hours 01/09/23 0840 01/11/23 1253        I have personally reviewed the following labs and images: CBC: Recent Labs  Lab 01/23/23 0402 01/25/23 0308 01/25/23 1829 01/26/23 0407 01/27/23 0435 01/28/23 0913  WBC 5.5 6.4  --  5.5 6.0 18.2*  HGB 8.6* 8.5* 8.8* 8.7* 8.0* 8.8*  HCT 26.7* 26.6* 26.0* 27.9* 24.8* 27.7*  MCV 85.6 85.3  --  85.3 84.1 83.9  PLT 310 291  --  262 238 216   BMP &GFR Recent Labs  Lab 01/23/23 0402 01/24/23 0504 01/25/23 0308 01/25/23 1829 01/26/23 0407 01/27/23 0435 01/28/23 0320 01/28/23 0913 01/29/23 0319  NA 138 139 135 138 133* 129*  --  132*  --   K 3.6 3.8 3.6 2.5* 3.9 3.3*  --  4.4  --   CL 114* 117* 113*  --  111 105  --  105  --   CO2 20* 18* 19*  --  16* 17*  --  14*  --   GLUCOSE 80 76 83  --  142* 118*  --  111*  --   BUN 13 12 9   --  7* 13  --  17  --   CREATININE 0.83 1.09* 0.81  --  0.83  1.09*  --  0.98 0.88  CALCIUM 8.0* 8.1* 7.9*  --  8.4* 7.9*  --  8.0*  --  MG 1.7  --  1.8  --  1.6* 2.4 2.0  --  1.9  PHOS 2.7  --   --   --  3.3 2.5  --   --   --    Estimated Creatinine Clearance: 69.7 mL/min (by C-G formula based on SCr of 0.88 mg/dL). Liver & Pancreas: Recent Labs  Lab 01/25/23 0308 01/26/23 0407 01/27/23 0435 01/28/23 0913  AST 18 56* 31 22  ALT 15 26 19 18   ALKPHOS 84 93 96 130*  BILITOT 0.5 0.5 0.7 0.5  PROT 4.7* 5.1* 4.9* 5.1*  ALBUMIN 1.8* 2.3* 2.1* 1.9*   No results for input(s): "LIPASE", "AMYLASE" in the last 168 hours. Recent Labs  Lab 01/28/23 2025  AMMONIA <10   Diabetic: No results for input(s): "HGBA1C" in the last 72 hours. Recent Labs  Lab 01/28/23 2352 01/29/23 0328 01/29/23 0850 01/29/23 1105 01/29/23 1141  GLUCAP 108* 86 55* 68* 131*   Cardiac Enzymes: No results for input(s): "CKTOTAL", "CKMB", "CKMBINDEX", "TROPONINI" in the last 168 hours. No results for input(s): "PROBNP" in the last 8760 hours. Coagulation Profile: No results for input(s): "INR", "PROTIME" in the last 168 hours. Thyroid Function Tests: Recent Labs    01/28/23 2025  TSH 1.513   Lipid Profile: No results for input(s): "CHOL", "HDL", "LDLCALC", "TRIG", "CHOLHDL", "LDLDIRECT" in the last 72 hours. Anemia Panel: Recent Labs    01/28/23 2025  VITAMINB12 663   Urine analysis:    Component Value Date/Time   COLORURINE YELLOW 01/28/2023 1720   APPEARANCEUR CLEAR 01/28/2023 1720   LABSPEC 1.010 01/28/2023 1720   PHURINE 6.0 01/28/2023 1720   GLUCOSEU NEGATIVE 01/28/2023 1720   HGBUR NEGATIVE 01/28/2023 1720   BILIRUBINUR NEGATIVE 01/28/2023 1720   KETONESUR NEGATIVE 01/28/2023 1720   PROTEINUR NEGATIVE 01/28/2023 1720   NITRITE NEGATIVE 01/28/2023 1720   LEUKOCYTESUR NEGATIVE 01/28/2023 1720   Sepsis Labs: Invalid input(s): "PROCALCITONIN", "LACTICIDVEN"  Microbiology: Recent Results (from the past 240 hour(s))  Culture, blood (Routine X  2) w Reflex to ID Panel     Status: None (Preliminary result)   Collection Time: 01/28/23  1:16 PM   Specimen: BLOOD LEFT HAND  Result Value Ref Range Status   Specimen Description BLOOD LEFT HAND  Final   Special Requests   Final    BOTTLES DRAWN AEROBIC AND ANAEROBIC Blood Culture results may not be optimal due to an inadequate volume of blood received in culture bottles   Culture   Final    NO GROWTH < 24 HOURS Performed at [facility], [address] [place], [place] [address]    Report Status PENDING  Incomplete  Culture, blood (Routine X 2) w Reflex to ID Panel     Status: None (Preliminary result)   Collection Time: 01/28/23  7:01 PM   Specimen: BLOOD LEFT ARM  Result Value Ref Range Status   Specimen Description BLOOD LEFT ARM  Final   Special Requests   Final    BOTTLES DRAWN AEROBIC AND ANAEROBIC Blood Culture adequate volume   Culture   Final    NO GROWTH < 12 HOURS Performed at [facility], [address], [place], [place] [address]    Report Status PENDING  Incomplete    Radiology Studies: No results found.    Tahira Olivarez T. Jonathan Kirkendoll [facility]  If 7PM-7AM, please contact night-coverage [email or web address] 01/29/2023, 1:44 PM

## 2023-01-30 DIAGNOSIS — E870 Hyperosmolality and hypernatremia: Secondary | ICD-10-CM | POA: Diagnosis not present

## 2023-01-30 DIAGNOSIS — R627 Adult failure to thrive: Secondary | ICD-10-CM | POA: Diagnosis not present

## 2023-01-30 DIAGNOSIS — Z7189 Other specified counseling: Secondary | ICD-10-CM | POA: Diagnosis not present

## 2023-01-30 DIAGNOSIS — G9341 Metabolic encephalopathy: Secondary | ICD-10-CM | POA: Diagnosis not present

## 2023-01-30 DIAGNOSIS — Z515 Encounter for palliative care: Secondary | ICD-10-CM | POA: Diagnosis not present

## 2023-01-30 DIAGNOSIS — I10 Essential (primary) hypertension: Secondary | ICD-10-CM | POA: Diagnosis not present

## 2023-01-30 LAB — GLUCOSE, CAPILLARY
Glucose-Capillary: 115 mg/dL — ABNORMAL HIGH (ref 70–99)
Glucose-Capillary: 117 mg/dL — ABNORMAL HIGH (ref 70–99)
Glucose-Capillary: 130 mg/dL — ABNORMAL HIGH (ref 70–99)
Glucose-Capillary: 65 mg/dL — ABNORMAL LOW (ref 70–99)
Glucose-Capillary: 70 mg/dL (ref 70–99)
Glucose-Capillary: 81 mg/dL (ref 70–99)
Glucose-Capillary: 91 mg/dL (ref 70–99)
Glucose-Capillary: <10 mg/dL — CL (ref 70–99)

## 2023-01-30 LAB — CBC
HCT: 22.2 % — ABNORMAL LOW (ref 36.0–46.0)
Hemoglobin: 7.3 g/dL — ABNORMAL LOW (ref 12.0–15.0)
MCH: 27.1 pg (ref 26.0–34.0)
MCHC: 32.9 g/dL (ref 30.0–36.0)
MCV: 82.5 fL (ref 80.0–100.0)
Platelets: 205 10*3/uL (ref 150–400)
RBC: 2.69 MIL/uL — ABNORMAL LOW (ref 3.87–5.11)
RDW: 21.5 % — ABNORMAL HIGH (ref 11.5–15.5)
WBC: 10.2 10*3/uL (ref 4.0–10.5)
nRBC: 0 % (ref 0.0–0.2)

## 2023-01-30 LAB — RENAL FUNCTION PANEL
Albumin: 1.7 g/dL — ABNORMAL LOW (ref 3.5–5.0)
Anion gap: 7 (ref 5–15)
BUN: 21 mg/dL (ref 8–23)
CO2: 18 mmol/L — ABNORMAL LOW (ref 22–32)
Calcium: 7.7 mg/dL — ABNORMAL LOW (ref 8.9–10.3)
Chloride: 108 mmol/L (ref 98–111)
Creatinine, Ser: 0.73 mg/dL (ref 0.44–1.00)
GFR, Estimated: >60 mL/min (ref >60)
Glucose, Bld: 90 mg/dL (ref 70–99)
Phosphorus: 2.9 mg/dL (ref 2.5–4.6)
Potassium: 3.9 mmol/L (ref 3.5–5.1)
Sodium: 133 mmol/L — ABNORMAL LOW (ref 135–145)

## 2023-01-30 LAB — CORTISOL: Cortisol, Plasma: 10 ug/dL

## 2023-01-30 LAB — MAGNESIUM: Magnesium: 1.6 mg/dL — ABNORMAL LOW (ref 1.7–2.4)

## 2023-01-30 MED ORDER — MAGNESIUM SULFATE 2 GM/50ML IV SOLN
2.0000 g | Freq: Once | INTRAVENOUS | Status: AC
  Administered 2023-01-30: 2 g via INTRAVENOUS
  Filled 2023-01-30: qty 50

## 2023-01-30 MED ORDER — DEXTROSE 50 % IV SOLN
INTRAVENOUS | Status: AC
  Administered 2023-01-30: 50 mL
  Filled 2023-01-30: qty 50

## 2023-01-30 MED ORDER — DEXTROSE 10 % IV SOLN: INTRAVENOUS | Status: DC

## 2023-01-30 NOTE — Evaluation (Signed)
Clinical/Bedside Swallow Evaluation Patient Details  Name: Christine [name] MRN: [id number] Date of Birth: 16-Mar-1951  Today's Date: 01/30/2023 Time: SLP Start Time (ACUTE ONLY): 1315 SLP Stop Time (ACUTE ONLY): 1330 SLP Time Calculation (min) (ACUTE ONLY): 15 min  Past Medical History:  Past Medical History:  Diagnosis Date   Allergy    dust, pollen, sulfa, prednisone   Anemia    Anxiety    Arthritis    "knees" (04/26/2016)   Colon polyps    benign per pt   Coronary artery disease    mild per 2015 cath in [place] (OM1 30%, RCA 30%)   GERD (gastroesophageal reflux disease)    Gout    History of hiatal hernia    Hyperlipidemia 05/20/2021   Hypertension    Migraine    "none since early /2017" (05/06/2016)   Obesity    OSA on CPAP    uses CPAP   Pre-diabetes    Pre-operative cardiovascular examination 08/22/2008   Renal insufficiency 11/09/2022   Vasculitis (HCC)    Bilateral   Past Surgical History:  Past Surgical History:  Procedure Laterality Date   ABDOMINAL HYSTERECTOMY     "partial"; both ovaries present   APPENDECTOMY  05/06/2016   BIOPSY  04/08/2022   Procedure: BIOPSY;  Surgeon: Sherrilyn Rist, MD;  Location: [facility];  Service: Gastroenterology;;   CARDIAC CATHETERIZATION  ~ 2015   CARDIAC CATHETERIZATION  2015   In [place]   CHILECTOMY Right 06/01/2017   Procedure: CHILECTOMY RIGHT FOOT;  Surgeon: Felecia Shelling, DPM;  Location: [facility];  Service: Podiatry;  Laterality: Right;   CHOLECYSTECTOMY N/A 01/25/2023   Procedure: LAPAROSCOPIC CHOLECYSTECTOMY WITH INTRAOPERATIVE CHOLANGIOGRAM;  Surgeon: Quentin Ore, MD;  Location: [facility];  Service: General;  Laterality: N/A;   ESOPHAGOGASTRODUODENOSCOPY N/A 01/25/2023   Procedure: ESOPHAGOGASTRODUODENOSCOPY (EGD);  Surgeon: Quentin Ore, MD;  Location: [facility];  Service: General;  Laterality: N/A;   ESOPHAGOGASTRODUODENOSCOPY (EGD) WITH PROPOFOL N/A 04/08/2022   Procedure: ESOPHAGOGASTRODUODENOSCOPY  (EGD) WITH PROPOFOL;  Surgeon: Sherrilyn Rist, MD;  Location: [facility];  Service: Gastroenterology;  Laterality: N/A;   LAPAROSCOPIC APPENDECTOMY N/A 05/06/2016   Procedure: LAPAROSCOPIC APPENDECTOMY;  Surgeon: Manus Rudd, MD;  Location: [facility];  Service: General;  Laterality: N/A;   LAPAROSCOPIC INSERTION GASTROSTOMY TUBE N/A 01/25/2023   Procedure: LAPAROSCOPIC INSERTION REMNANT GASTROSTOMY TUBE;  Surgeon: Quentin Ore, MD;  Location: [facility];  Service: General;  Laterality: N/A;   POLYPECTOMY  04/08/2022   Procedure: POLYPECTOMY;  Surgeon: Sherrilyn Rist, MD;  Location: [facility];  Service: Gastroenterology;;   TONSILLECTOMY     HPI:  Patient is a 72 y.o. 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 She has had multiple hospitalizations and ED visits in the past several months. She has been refusing to eat, drink or take her medications since gastric bypass surgery and after family discussion with GI at [facility], descision was made to place PEG tube for nutrition. 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 GI consulted, they do not appreciate this being an issue for which she could not eat. Patient presented to [facility] initially and was transferred to [facility] for surgery consultation. PEG tube surgery was 01/25/23. SLP swallow evaluation completed at [facility] and then SLP saw  her at [facility] and signed off as patient was not exhibiting clinical s/s of oropharyngeal dysphagia. SLP swallow evaluation reordered 01/29/23.    Assessment / Plan / Recommendation  Clinical Impression  Patient is not currently presenting with clinical s/s of oropharyngeal dysphagia as per this bedside swallow evaluation. She was awake,  alert and cooperative throughout session. She acknowledges that she has not been eating or drinking but unable to state why that is. When SLP asked her if she was feeling hungry or having any desire to eat, she said no. When SLP asked her how she felt about having no desire to eat, she said "curiousity". Patient accepted a couple sips of thin liquids (juice) and a bite of gelatin. Swallow initiation was timely and no oral holding observed. SLP is not recommending further skilled intervention at this time as patient's swallow appears [facility] and swallow function does not appear to be the factore impacting her lack of PO intake. SLP Visit Diagnosis: Dysphagia, unspecified (R13.10)    Aspiration Risk  Mild aspiration risk    Diet Recommendation Regular;Thin liquid    Liquid Administration via: Cup;Straw Medication Administration: Whole meds with puree Supervision: Patient able to self feed Compensations: Minimize environmental distractions Postural Changes: Seated upright at 90 degrees    Other  Recommendations Oral Care Recommendations: Oral care BID    Recommendations for follow up therapy are one component of a multi-disciplinary discharge planning process, led by the attending physician.  Recommendations may be updated based on patient status, additional functional criteria and insurance authorization.  Follow up Recommendations No SLP follow up      Assistance Recommended at Discharge  N/A  Functional Status Assessment Patient has not had a recent decline in their functional status  Frequency and Duration     N/A       Prognosis        Swallow Study   General Date of Onset: 01/29/23 HPI: Patient is a 72 y.o. 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 She has had multiple hospitalizations and ED visits in the past several months. She has been refusing to eat, drink or take her medications since gastric bypass surgery  and after family discussion with GI at [facility], descision was made to place PEG tube for nutrition. 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 GI consulted, they do not appreciate this being an issue for which she could not eat. Patient presented to [facility] initially and was transferred to [facility] for surgery consultation. PEG tube surgery was 01/25/23. SLP swallow evaluation completed at [facility] and then SLP saw her at [facility] and signed off as patient was not exhibiting clinical s/s of oropharyngeal dysphagia. SLP swallow evaluation reordered 01/29/23. Type of Study: Bedside Swallow Evaluation Previous Swallow Assessment: 01/13/23 BSE at [facility] Diet Prior to this Study: Regular;Thin liquids (Level 0) Temperature Spikes Noted: No Respiratory Status: Room air History of Recent Intubation: No Behavior/Cognition: Alert;Cooperative;Pleasant mood Oral Cavity Assessment: Within Functional Limits Oral Care Completed by SLP: No Oral Cavity - Dentition: Adequate natural dentition Vision: Functional for self-feeding Patient Positioning: Upright in bed Baseline Vocal Quality: Normal Volitional Cough: Strong Volitional Swallow: Able to elicit    Oral/Motor/Sensory Function Overall Oral Motor/Sensory Function: Within functional limits   Ice Chips     Thin Liquid  Thin Liquid: Within functional limits Presentation: Straw    Nectar Thick     Honey Thick     Puree     Solid     Solid: Within functional limits Presentation: Risa Grill, MA, CCC-SLP Speech Therapy

## 2023-01-30 NOTE — Progress Notes (Signed)
PROGRESS NOTE  Christine [name] UVO:[id number] DOB: 27-Jul-1950   PCP: [name], DO  Patient is from: 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 Has had multiple hospitalizations and ED visits in the past several months. Postoperative course complicated by very poor oral intake, weakness, currently residing in an SNF. She has refused to eat, drink, and take her medications. She was admitted to [facility] with confusion. GI was consulted and did not find an organic cause for her not eating. After discussing with the family, a feeding tube was recommended and she was transferred to [facility] for surgery consultation.   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 GI consulted, they do not appreciate this being an issue for which she could not eat.  After discussions with family, she was transferred to [facility] for surgical evaluation for gastrostomy tube.  She was evaluated by psychiatry.    Patient had PEG tube and laparoscopic cholecystectomy with IOC on 10/1.  Started on tube feed.  Also on D10 infusion due to recurrent hypoglycemia.  Subjective: Seen and examined earlier this morning.  Hypoglycemic to <10 overnight despite tube feed at goal.  Hypoglycemia resolved with D50 infusion.  CBG dropped to 65 again later this morning.  D10 started.  Patient has no complaints but not a great historian.  She is awake and alert but only oriented to self, "hospital" and [place].  Objective: Vitals:   01/29/23 1955 01/30/23 0433 01/30/23 0440 01/30/23 0847  BP: 124/70 (!) 122/90  (!) 150/83  Pulse:    (!) 102  Resp: 17 14  17   Temp: 97.9 F (36.6 C) 98.8 F (37.1 C)  (!) 97.2 F (36.2 C)  TempSrc: Oral     SpO2: 100%     Weight:   112 kg   Height:        Examination:  GENERAL: No apparent distress.  Nontoxic. HEENT: MMM.  Vision and hearing grossly intact.  NECK: Supple.  No apparent JVD.  RESP:  No IWOB.  Fair aeration bilaterally. CVS:  RRR. Heart sounds normal.  ABD/GI/GU: BS+. Abd soft.  Nontender.  G-tube in place. MSK/EXT:  Moves extremities. No apparent deformity.  1+ BLE edema.  Venous insufficiency. SKIN: Stage II bilateral buttock wounds without signs of infection. NEURO: awake and alert but only oriented to self, "hospital" and [place].  Follows commands.  No focal neurodeficit. PSYCH: Calm.  No distress or agitation.  Procedures:  10/1-gastrostomy tube and laparoscopic cholecystectomy  Microbiology summarized: 9/14-COVID-19 PCR nonreactive 9/14-C. difficile negative 9/14-MRSA PCR nonreactive 9/14-GIP negative 9/20-urine culture with ESBL E. Coli 9/22-blood cultures NGTD 10/4-blood culture  Assessment and plan: Adult Failure to Thrive/Esophageal dysmotility, refusing to eat: Patient has no desire to eat.  Unclear if this is due to poor appetite, psychogenic or cognitive deficit.  Evaluated by psychiatry and refused additional meds for depressive symptoms earlier in the course.  Received high-dose thiamine from 9/23-9/24 and 10/2-10/5 -S/p laparoscopic gastrostomy and cholecystectomy with IOC on 10/1 -On continuous and bolus tube  feed.  -Remeron was started on 10/2. -Continue home Cymbalta -Reconsulted dietitian to see if we can increase tube feed given recurrent hypoglycemia -Regular diet  Acute metabolic encephalopathy: Initially thought to be due to severe hypernatremia.  VBG, ammonia, B12, TSH and RPR unrevealing.   Received high-dose thiamine as above.  Encephalopathy improved after stopping and decreasing some sedating medications.  Awake and alert but only oriented to self and place. -Decreased oxycodone on 10/4 -Reorientation and delirium precautions.  SIRS: No clear source of infection yet.  CXR, UA and blood culture unrevealing.  She has bilateral buttock wound but no signs of infection.  No respiratory symptoms.  Hemodynamically stable. -Monitor off antibiotics.   Abdominal pain -9/14 CT abd--limited by motion, but  No obvious obstruction, free air or free fluid; s/p gastric bypass. 9/15.  Abd US showed distended gallbladder with sludge.  LFTs normal.  HIDA scan negative.  -S/p laparoscopic cholecystectomy with IOC on 10/1   Hypokalemia/Hypomagnesemia/Hypophosphatemia -due to FTT.   -Monitor and replace as indicated  Recurrent hypoglycemia: Recurrent hypoglycemia despite tube feed at goal.  She is on bolus and continuous infusion.  P.o. intake is poor.  A.m. cortisol 10.0. -Follow ACTH -Reconsulted RD to see if tube feed can be increased further -D10 at 30 cc an hour   AKI -baseline Cr ~0.8.  Creatinine slightly up.  Continue monitoring.  ESBL E. coli UTI: Completed 7 days of IV meropenem 09/29.    NAGMA: Stable. -Continue sodium bicarb per tube  Hypernatremia: Resolved.  Hyponatremia: Due to free water?  Improved. -Decreased free water on 10/3.  OSA -Nightly CPAP ordered    Essential hypertension   -Monitor  GERD -continue PPI  Bilateral buttock wounds: Stage II: POA -Wound care per WOC RN -Frequent turning   Goals of care-DNR/DNI. -Palliative medicine following.   Morbid obesity Body mass index is 46.65 kg/m. Nutrition Problem: Increased nutrient needs Etiology: wound healing Signs/Symptoms: estimated needs Interventions: MVI, Prostat, Glucerna shake    DVT prophylaxis:  enoxaparin (LOVENOX) injection 40 mg Start: 01/08/23 2200  Code Status: DNR/DNI Family  Communication: None at bedside. Level of care: Med-Surg Status is: Inpatient Remains inpatient appropriate because: Failure to thrive, encephalopathy and hypoglycemia   Final disposition: SNF Consultants:  GI Psychiatry General Surgery Palliative medicine  35 minutes with more than 50% spent in reviewing records, counseling patient/family and coordinating care.   Sch Meds:  Scheduled Meds:  antiseptic oral rinse  15 mL Mouth Rinse BID   Chlorhexidine Gluconate Cloth  6 each Topical Daily   cholecalciferol  5,000 Units Per Tube Daily   colchicine  0.6 mg Per Tube Daily   docusate  100 mg Per Tube BID   DULoxetine  60 mg Oral Daily   enoxaparin (LOVENOX) injection  40 mg Subcutaneous Q24H   feeding supplement (GLUCERNA SHAKE)  237 mL Oral TID BM   feeding supplement (JEVITY 1.5 CAL/FIBER)  1,000 mL Per Tube Q24H   feeding supplement (PROSource TF20)  60 mL Per Tube Daily   ferrous sulfate  325 mg Oral Q breakfast   folic acid  1 mg Per Tube Daily   free water  100 mL Per Tube Q8H   Gerhardt's butt cream   Topical BID   mirtazapine  7.5 mg Per Tube QHS   multivitamin with minerals  1 tablet Per Tube BID   mouth rinse  15 mL Mouth Rinse TID   oxybutynin  15 mg Oral QHS   pantoprazole (PROTONIX) IV  40 mg Intravenous Q12H   sodium bicarbonate  1,300 mg Per Tube TID   sodium chloride flush  10-40 mL Intracatheter Q12H   thiamine  100 mg Oral Daily   Continuous Infusions:  dextrose 30 mL/hr at 01/30/23 0839    PRN Meds:.acetaminophen, diclofenac Sodium, metoprolol tartrate, ondansetron **OR** ondansetron (ZOFRAN) IV, oxyCODONE, simethicone, sodium chloride flush  Antimicrobials: Anti-infectives (From admission, onward)    Start     Dose/Rate Route Frequency Ordered Stop   01/25/23 1309  ceFAZolin (ANCEF) IVPB 2g/100 mL premix        2 g 200 mL/hr over 30 Minutes Intravenous 30 min pre-op 01/25/23 1309 01/25/23 1745   01/20/23 1430  meropenem (MERREM) 1 g in sodium  chloride 0.9 % 100 mL IVPB  Status:  Discontinued        1 g 200 mL/hr over 30 Minutes Intravenous Every 8 hours 01/20/23 1338 01/24/23 0801   01/19/23 1700  meropenem (MERREM) 1 g in sodium chloride 0.9 % 100 mL IVPB  Status:  Discontinued        1 g 200 mL/hr over 30 Minutes Intravenous Every 12 hours 01/19/23 1345 01/20/23 1338   01/17/23 1515  meropenem (MERREM) 1 g in sodium chloride 0.9 % 100 mL IVPB  Status:  Discontinued        1 g 200 mL/hr over 30 Minutes Intravenous Every 8 hours 01/17/23 1428 01/19/23 1345   01/16/23 1400  piperacillin-tazobactam (ZOSYN) IVPB 3.375 g  Status:  Discontinued        3.375 g 12.5 mL/hr over 240 Minutes Intravenous Every 8 hours 01/16/23 1322 01/17/23 1428   01/14/23 1000  cefTRIAXone (ROCEPHIN) 1 g in sodium chloride 0.9 % 100 mL IVPB  Status:  Discontinued        1 g 200 mL/hr over 30 Minutes Intravenous Every 24 hours 01/14/23 0925 01/16/23 1322   01/09/23 0930  cefTRIAXone (ROCEPHIN) 1 g in sodium chloride 0.9 % 100 mL IVPB        1 g 200 mL/hr over 30 Minutes Intravenous Every 24 hours 01/09/23 0840 01/11/23 1253        I have personally reviewed the following labs and images: CBC: Recent Labs  Lab 01/26/23 0407 01/27/23 0435 01/28/23 0913 01/29/23 1504 01/30/23 0353  WBC 5.5 6.0 18.2* 10.2 10.2  NEUTROABS  --   --   --  8.7*  --   HGB 8.7* 8.0* 8.8* 7.5* 7.3*  HCT 27.9* 24.8* 27.7* 22.8* 22.2*  MCV 85.3 84.1 83.9 81.7 82.5  PLT 262 238 216 216 205   BMP &GFR Recent Labs  Lab 01/26/23 0407 01/27/23 0435 01/28/23 0320 01/28/23 0913 01/29/23 0319 01/29/23 1504 01/30/23 0353  NA 133* 129*  --  132*  --  134* 133*  K 3.9 3.3*  --  4.4  --  4.1 3.9  CL 111 105  --  105  --  106 108  CO2 16* 17*  --  14*  --  16* 18*  GLUCOSE 142* 118*  --  111*  --  118* 90  BUN 7* 13  --  17  --  22 21  CREATININE 0.83 1.09*  --  0.98 0.88 0.82 0.73  CALCIUM 8.4* 7.9*  --  8.0*  --  7.9* 7.7*  MG 1.6* 2.4 2.0  --  1.9 1.8 1.6*  PHOS  3.3 2.5  --   --   --  1.0* 2.9   Estimated Creatinine Clearance: 77  mL/min (by C-G formula based on SCr of 0.73 mg/dL). Liver & Pancreas: Recent Labs  Lab 01/25/23 0308 01/26/23 0407 01/27/23 0435 01/28/23 0913 01/29/23 1504 01/30/23 0353  AST 18 56* 31 22 17   --   ALT 15 26 19 18 14   --   ALKPHOS 84 93 96 130* 146*  --   BILITOT 0.5 0.5 0.7 0.5 0.6  --   PROT 4.7* 5.1* 4.9* 5.1* 5.0*  --   ALBUMIN 1.8* 2.3* 2.1* 1.9* 1.8* 1.7*   No results for input(s): "LIPASE", "AMYLASE" in the last 168 hours. Recent Labs  Lab 01/28/23 2025  AMMONIA <10   Diabetic: No results for input(s): "HGBA1C" in the last 72 hours. Recent Labs  Lab 01/30/23 0427 01/30/23 0431 01/30/23 0849 01/30/23 1145 01/30/23 1213  GLUCAP <10* 70 91 65* 117*   Cardiac Enzymes: No results for input(s): "CKTOTAL", "CKMB", "CKMBINDEX", "TROPONINI" in the last 168 hours. No results for input(s): "PROBNP" in the last 8760 hours. Coagulation Profile: No results for input(s): "INR", "PROTIME" in the last 168 hours. Thyroid Function Tests: Recent Labs    01/28/23 2025  TSH 1.513   Lipid Profile: No results for input(s): "CHOL", "HDL", "LDLCALC", "TRIG", "CHOLHDL", "LDLDIRECT" in the last 72 hours. Anemia Panel: Recent Labs    01/28/23 2025  VITAMINB12 663   Urine analysis:    Component Value Date/Time   COLORURINE YELLOW 01/28/2023 1720   APPEARANCEUR CLEAR 01/28/2023 1720   LABSPEC 1.010 01/28/2023 1720   PHURINE 6.0 01/28/2023 1720   GLUCOSEU NEGATIVE 01/28/2023 1720   HGBUR NEGATIVE 01/28/2023 1720   BILIRUBINUR NEGATIVE 01/28/2023 1720   KETONESUR NEGATIVE 01/28/2023 1720   PROTEINUR NEGATIVE 01/28/2023 1720   NITRITE NEGATIVE 01/28/2023 1720   LEUKOCYTESUR NEGATIVE 01/28/2023 1720   Sepsis Labs: Invalid input(s): "PROCALCITONIN", "LACTICIDVEN"  Microbiology: Recent Results (from the past 240 hour(s))  Culture, blood (Routine X 2) w Reflex to ID Panel     Status: None (Preliminary  result)   Collection Time: 01/28/23  1:16 PM   Specimen: BLOOD LEFT HAND  Result Value Ref Range Status   Specimen Description BLOOD LEFT HAND  Final   Special Requests   Final    BOTTLES DRAWN AEROBIC AND ANAEROBIC Blood Culture results may not be optimal due to an inadequate volume of blood received in culture bottles   Culture   Final    NO GROWTH 2 DAYS Performed at [facility], [address], [place], [place] [address]    Report Status PENDING  Incomplete  Culture, blood (Routine X 2) w Reflex to ID Panel     Status: None (Preliminary result)   Collection Time: 01/28/23  7:01 PM   Specimen: BLOOD LEFT ARM  Result Value Ref Range Status   Specimen Description BLOOD LEFT ARM  Final   Special Requests   Final    BOTTLES DRAWN AEROBIC AND ANAEROBIC Blood Culture adequate volume   Culture   Final    NO GROWTH 2 DAYS Performed at [facility], [address] [place], [place] [address]    Report Status PENDING  Incomplete    Radiology Studies: No results found.    Armiyah Capron T. Cadi Rhinehart [facility]  If 7PM-7AM, please contact night-coverage [email or web address] 01/30/2023, 12:47 PM

## 2023-01-30 NOTE — Plan of Care (Signed)
  Problem: Education: Goal: Ability to state signs and symptoms to report to health care provider will improve Outcome: Progressing

## 2023-01-30 NOTE — Progress Notes (Signed)
Patient unavailable for CPAP at this time.  RT available when ready.

## 2023-01-30 NOTE — Progress Notes (Addendum)
   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  Has had multiple hospitalizations and ED visits in the past several months.  Postoperative course complicated by very poor oral intake, weakness, currently residing in an SNF.  She has refused to eat, drink, and take her medications.  She was admitted to [facility] with confusion.  GI was consulted and did not find an organic cause for her not eating.  After discussing with the family, a feeding tube was recommended and she was transferred to [facility] for surgery consultation. Palliative care has been asked to continue involvement to support patient and further address goals of care as it relates to possible PEG placement,   Today's Discussion 01/30/2023  *Please note that this is a verbal dictation therefore any spelling or grammatical errors are due to the "Dragon Medical One" system interpretation.  Chart reviewed inclusive of vital signs, progress notes, laboratory results, and diagnostic images.   I met with [name] at bedside this early morning. She is awake and alert. She denies pain, nausea, or shortness of breath. She is eager to move on to the next phase which is rehabilitation.   We discussed short term and long term goals. [name] is hopeful to continue getting out of bed regularly and gaining strength through working with PT/OT.  Long term goals would be getting to a form of independence and eventually getting back to [name]'s home.   Questions and concerns addressed/Palliative Support Provided.   Objective Assessment: Vital Signs Vitals:   01/30/23 0433 01/30/23 0847  BP: (!) 122/90 (!) 150/83  Pulse:  (!) 102  Resp: 14 17  Temp: 98.8 F (37.1 C) (!) 97.2 F (36.2 C)  SpO2:      Intake/Output Summary (Last 24 hours) at 01/30/2023  0955 Last data filed at 01/30/2023 9518 Gross per 24 hour  Intake 50 ml  Output --  Net 50 ml   Last Weight  Most recent update: 01/30/2023  4:41 AM    Weight  112 kg (246 lb 14.6 oz)            Gen:  Elderly AA F in NAD HEENT: Dry mucous membranes CV: Regular rate and rhythm  PULM: On RA, breathing is even and nonlabored ABD: (+) PEG EXT: (+) edema  Neuro: Alert and oriented x3   SUMMARY OF RECOMMENDATIONS   DNAR/DNI  Allowing time for outcomes  Appreciate PT/OT for mobility efforts  Ongoing Chaplain support  Appreciate TOC helping with advanced care planning  Incremental PMT support   Billing based on MDM: Moderate  ______________________________________________________________________________________ Lamarr Lulas [facility] Palliative Medicine Team Team Cell Phone: [phone] Please utilize secure chat with additional questions, if there is no response within 30 minutes please call the above phone number  Palliative Medicine Team providers are available by phone from 7am to 7pm daily and can be reached through the team cell phone.  Should this patient require assistance outside of these hours, please call the patient's attending physician.

## 2023-01-31 DIAGNOSIS — E870 Hyperosmolality and hypernatremia: Secondary | ICD-10-CM | POA: Diagnosis not present

## 2023-01-31 DIAGNOSIS — R627 Adult failure to thrive: Secondary | ICD-10-CM | POA: Diagnosis not present

## 2023-01-31 DIAGNOSIS — G9341 Metabolic encephalopathy: Secondary | ICD-10-CM | POA: Diagnosis not present

## 2023-01-31 DIAGNOSIS — I10 Essential (primary) hypertension: Secondary | ICD-10-CM | POA: Diagnosis not present

## 2023-01-31 DIAGNOSIS — Z7189 Other specified counseling: Secondary | ICD-10-CM | POA: Diagnosis not present

## 2023-01-31 DIAGNOSIS — Z515 Encounter for palliative care: Secondary | ICD-10-CM | POA: Diagnosis not present

## 2023-01-31 LAB — CBC
HCT: 21.8 % — ABNORMAL LOW (ref 36.0–46.0)
Hemoglobin: 7.2 g/dL — ABNORMAL LOW (ref 12.0–15.0)
MCH: 27.7 pg (ref 26.0–34.0)
MCHC: 33 g/dL (ref 30.0–36.0)
MCV: 83.8 fL (ref 80.0–100.0)
Platelets: 247 10*3/uL (ref 150–400)
RBC: 2.6 MIL/uL — ABNORMAL LOW (ref 3.87–5.11)
RDW: 20.8 % — ABNORMAL HIGH (ref 11.5–15.5)
WBC: 10.9 10*3/uL — ABNORMAL HIGH (ref 4.0–10.5)
nRBC: 0 % (ref 0.0–0.2)

## 2023-01-31 LAB — RENAL FUNCTION PANEL
Albumin: 1.6 g/dL — ABNORMAL LOW (ref 3.5–5.0)
Anion gap: 6 (ref 5–15)
BUN: 22 mg/dL (ref 8–23)
CO2: 19 mmol/L — ABNORMAL LOW (ref 22–32)
Calcium: 7.8 mg/dL — ABNORMAL LOW (ref 8.9–10.3)
Chloride: 108 mmol/L (ref 98–111)
Creatinine, Ser: 0.69 mg/dL (ref 0.44–1.00)
GFR, Estimated: >60 mL/min (ref >60)
Glucose, Bld: 103 mg/dL — ABNORMAL HIGH (ref 70–99)
Phosphorus: 2.7 mg/dL (ref 2.5–4.6)
Potassium: 4 mmol/L (ref 3.5–5.1)
Sodium: 133 mmol/L — ABNORMAL LOW (ref 135–145)

## 2023-01-31 LAB — GLUCOSE, CAPILLARY
Glucose-Capillary: 46 mg/dL — ABNORMAL LOW (ref 70–99)
Glucose-Capillary: 63 mg/dL — ABNORMAL LOW (ref 70–99)
Glucose-Capillary: 130 mg/dL — ABNORMAL HIGH (ref 70–99)
Glucose-Capillary: 102 mg/dL — ABNORMAL HIGH (ref 70–99)
Glucose-Capillary: 118 mg/dL — ABNORMAL HIGH (ref 70–99)
Glucose-Capillary: 93 mg/dL (ref 70–99)
Glucose-Capillary: 86 mg/dL (ref 70–99)

## 2023-01-31 LAB — ACTH: C206 ACTH: 12.8 pg/mL (ref 7.2–63.3)

## 2023-01-31 LAB — MAGNESIUM: Magnesium: 1.9 mg/dL (ref 1.7–2.4)

## 2023-01-31 MED ORDER — DEXTROSE 50 % IV SOLN
12.5000 g | INTRAVENOUS | Status: AC
  Administered 2023-01-31: 12.5 g via INTRAVENOUS
  Filled 2023-01-31: qty 50

## 2023-01-31 MED ORDER — JEVITY 1.5 CAL/FIBER PO LIQD
1000.0000 mL | ORAL | Status: DC
  Administered 2023-01-31 – 2023-02-03 (×4): 1000 mL
  Filled 2023-01-31 (×6): qty 1000

## 2023-01-31 NOTE — TOC Progression Note (Addendum)
Transition of Care [facility]) - Progression Note    Patient Details  Name: Christine [name] MRN: [id number] Date of Birth: 11-25-1950  Transition of Care [facility]) CM/SW Contact  Carley Hammed, LCSW Phone Number: 01/31/2023, 1:47 PM  Clinical Narrative:    CSW spoke with Wadie Lessen and was advised that authorization has been approved and they can accept when ready. MD noting pt is Not medically ready yet. CSW updated sister, who is still agreeable to plan, all questions answered. TOC will continue to follow for DC needs.    Expected Discharge Plan: Skilled Nursing Facility Barriers to Discharge: Continued Medical Work up  Expected Discharge Plan and Services In-house Referral: Clinical Social Work Discharge Planning Services: CM Consult Post Acute Care Choice: Skilled Nursing Facility Living arrangements for the past 2 months: Skilled Nursing Facility                                       Social Determinants of Health (SDOH) Interventions SDOH Screenings   Food Insecurity: No Food Insecurity (01/18/2023)  Housing: Patient Declined (01/18/2023)  Transportation Needs: No Transportation Needs (01/18/2023)  Utilities: Not At Risk (01/18/2023)  Alcohol Screen: Low Risk  (07/13/2022)  Depression (PHQ2-9): Low Risk  (01/21/2022)  Financial Resource Strain: Low Risk  (12/01/2022)   Received from [facility]  Physical Activity: Insufficiently Active (07/13/2022)  Social Connections: Moderately Integrated (12/01/2022)   Received from [facility]  Stress: No Stress Concern Present (07/13/2022)  Tobacco Use: Medium Risk (01/25/2023)  Health Literacy: Medium Risk (12/01/2022)   Received from [facility]    Readmission Risk Interventions    01/09/2023   11:11 AM  Readmission Risk Prevention Plan  Transportation Screening Complete  HRI or Home Care Consult Complete  Social Work Consult for Recovery Care Planning/Counseling Complete  Palliative Care Screening Not Applicable  Medication  Review Oceanographer) Complete

## 2023-01-31 NOTE — Progress Notes (Signed)
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 Has had multiple hospitalizations and ED visits in the past several months. Postoperative course complicated by very poor oral intake, weakness, currently residing in an SNF. She has refused to eat, drink, and take her medications. She was admitted to [facility] with confusion. GI was consulted and did not find an organic cause for her not eating. After discussing with the family, a feeding tube was recommended and she was transferred to [facility] for surgery consultation.   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 GI consulted, they do not appreciate this being an issue for which she could not eat.  After discussions with family, she was transferred to [facility] for surgical evaluation for gastrostomy tube.  She was evaluated by psychiatry.    Patient had PEG tube and laparoscopic cholecystectomy with IOC on 10/1.  Started on tube feed.  Also on D10 infusion due to recurrent hypoglycemia.  Subjective: Seen and examined earlier this morning.  Hypoglycemia to 46 overnight.  Hypoglycemia resolved with D50.  Patient is on continuous tube feed and oral Glucerna.  She has not been taking Glucerna.  Poor p.o. intake.  No complaints but not a great historian.  She is awake but only oriented to self, month and year.  Thinks he is at  home.  Objective: Vitals:   01/30/23 1918 01/31/23 0449 01/31/23 0500 01/31/23 0831  BP: (!) 140/88 132/83  136/71  Pulse:    (!) 107  Resp: 18 15  17   Temp: 98.4 F (36.9 C) 98.2 F (36.8 C)  97.7 F (36.5 C)  TempSrc: Oral Oral    SpO2: 100%   100%  Weight:   112.6 kg   Height:        Examination:  GENERAL: No apparent distress.  Nontoxic. HEENT: MMM.  Vision and hearing grossly intact.  NECK: Supple.  No apparent JVD.  RESP:  No IWOB.  Fair aeration bilaterally. CVS:  RRR. Heart sounds normal.  ABD/GI/GU: BS+. Abd soft.  Nontender.  G-tube in place. MSK/EXT:  Moves extremities. No apparent deformity.  1+ BLE edema.  Venous insufficiency. SKIN: Stage II bilateral buttock wounds without signs of infection. NEURO: awake.  Oriented to self, month and year but not place or date.  Follows commands.  No focal neurodeficit. PSYCH: Calm.  No distress or agitation.  Procedures:  10/1-gastrostomy tube and laparoscopic cholecystectomy  Microbiology summarized: 9/14-COVID-19 PCR nonreactive 9/14-C. difficile negative 9/14-MRSA PCR nonreactive 9/14-GIP negative 9/20-urine culture with ESBL E. Coli 9/22-blood cultures NGTD 10/4-blood culture  Assessment and plan: Adult Failure to Thrive/Esophageal dysmotility, refusing to eat: Patient has no desire to eat.  Unclear if this is due to poor appetite, psychogenic or cognitive deficit.  Evaluated by psychiatry and refused additional meds for depressive symptoms earlier in the course.  Received high-dose thiamine from 9/23-9/24 and 10/2-10/5 -S/p laparoscopic gastrostomy and cholecystectomy with IOC on 10/1 -On  continuous and bolus tube feed.  -Remeron was started on 10/2. -Continue home Cymbalta -Dietitian increased continues to feed to 45 cc an hour. -Regular diet  Acute metabolic encephalopathy: Initially thought to be due to severe hypernatremia.  VBG, ammonia, B12, TSH and RPR unrevealing.  Received high-dose thiamine as above.   Encephalopathy improved after stopping and decreasing some sedating medications.  Seems to be waxing and waning. -Decreased oxycodone on 10/4 -Reorientation and delirium precautions.  SIRS: No clear source of infection yet.  CXR, UA and blood culture unrevealing.  She has bilateral buttock wound but no signs of infection.  No respiratory symptoms.  Hemodynamically stable.  Leukocytosis resolving without antibiotics. -Monitor off antibiotics.   Abdominal pain -9/14 CT abd--limited by motion, but  No obvious obstruction, free air or free fluid; s/p gastric bypass. 9/15.  Abd US showed distended gallbladder with sludge.  LFTs normal.  HIDA scan negative.  -S/p laparoscopic cholecystectomy with IOC on 10/1   Hypokalemia/Hypomagnesemia/Hypophosphatemia -due to FTT.   -Monitor and replace as indicated  Recurrent hypoglycemia: Recurrent hypoglycemia despite tube feed at goal rate.  She is on p.o. Glucerna but poor p.o. intake.  A.m. cortisol 10.0. -Follow ACTH -Dietitian increase tube feed to 45 cc an hour -Increase D10 to 50 cc an hour -Continue monitoring CBG  Normocytic anemia: Relatively stable.  No overt bleeding.  Anemia panel suggests anemia of chronic disease. Recent Labs    01/21/23 0400 01/23/23 0402 01/25/23 0308 01/25/23 1829 01/26/23 0407 01/27/23 0435 01/28/23 0913 01/29/23 1504 01/30/23 0353 01/31/23 0351  HGB 8.5* 8.6* 8.5* 8.8* 8.7* 8.0* 8.8* 7.5* 7.3* 7.2*  -Continue monitoring -Optimize nutrition  AKI -baseline Cr ~0.8.  Creatinine slightly up.  Continue monitoring.  ESBL E. coli UTI: Completed 7 days of IV meropenem 09/29.    NAGMA: Stable. -Continue sodium bicarb per tube  Hypernatremia: Resolved.  Hyponatremia: Due to free water?  Improved. -Decreased free water on 10/3.  OSA -Nightly CPAP ordered    Essential hypertension   -Monitor  GERD -continue PPI  Bilateral buttock wounds: Stage II: POA -Wound care per WOC RN -Frequent turning    Goals of care-DNR/DNI. -Palliative medicine following.   Morbid obesity Body mass index is 46.9 kg/m. Nutrition Problem: Increased nutrient needs Etiology: wound healing Signs/Symptoms: estimated needs Interventions: MVI, Prostat, Glucerna shake    DVT prophylaxis:  enoxaparin (LOVENOX) injection 40 mg Start: 01/08/23 2200  Code Status: DNR/DNI Family Communication: None at bedside. Level of care: Med-Surg Status is: Inpatient Remains inpatient appropriate because: Failure to thrive, encephalopathy and recurrent hypoglycemia   Final disposition: SNF Consultants:  GI Psychiatry General Surgery Palliative medicine  35 minutes with more than 50% spent in reviewing records, counseling patient/family and coordinating care.   Sch Meds:  Scheduled Meds:  antiseptic oral rinse  15 mL Mouth Rinse BID   Chlorhexidine Gluconate Cloth  6 each Topical Daily   cholecalciferol  5,000 Units Per Tube Daily   colchicine  0.6 mg Per Tube Daily   docusate  100 mg Per Tube BID   DULoxetine  60 mg Oral Daily   enoxaparin (LOVENOX) injection  40 mg Subcutaneous Q24H   feeding supplement (GLUCERNA SHAKE)  237 mL Oral TID BM   feeding supplement (JEVITY 1.5 CAL/FIBER)  1,000 mL Per Tube Q24H   feeding supplement (PROSource TF20)  60 mL Per Tube Daily   ferrous sulfate  325 mg Oral Q breakfast   folic acid  1 mg Per Tube Daily  free water  100 mL Per Tube Q8H   Gerhardt's butt cream   Topical BID   mirtazapine  7.5 mg Per Tube QHS   multivitamin with minerals  1 tablet Per Tube BID   mouth rinse  15 mL Mouth Rinse TID   oxybutynin  15 mg Oral QHS   pantoprazole (PROTONIX) IV  40 mg Intravenous Q12H   sodium bicarbonate  1,300 mg Per Tube TID   sodium chloride flush  10-40 mL Intracatheter Q12H   thiamine  100 mg Oral Daily   Continuous Infusions:  dextrose 50 mL/hr at 01/31/23 0824    PRN Meds:.acetaminophen, diclofenac Sodium, metoprolol tartrate, ondansetron **OR**  ondansetron (ZOFRAN) IV, oxyCODONE, simethicone, sodium chloride flush  Antimicrobials: Anti-infectives (From admission, onward)    Start     Dose/Rate Route Frequency Ordered Stop   01/25/23 1309  ceFAZolin (ANCEF) IVPB 2g/100 mL premix        2 g 200 mL/hr over 30 Minutes Intravenous 30 min pre-op 01/25/23 1309 01/25/23 1745   01/20/23 1430  meropenem (MERREM) 1 g in sodium chloride 0.9 % 100 mL IVPB  Status:  Discontinued        1 g 200 mL/hr over 30 Minutes Intravenous Every 8 hours 01/20/23 1338 01/24/23 0801   01/19/23 1700  meropenem (MERREM) 1 g in sodium chloride 0.9 % 100 mL IVPB  Status:  Discontinued        1 g 200 mL/hr over 30 Minutes Intravenous Every 12 hours 01/19/23 1345 01/20/23 1338   01/17/23 1515  meropenem (MERREM) 1 g in sodium chloride 0.9 % 100 mL IVPB  Status:  Discontinued        1 g 200 mL/hr over 30 Minutes Intravenous Every 8 hours 01/17/23 1428 01/19/23 1345   01/16/23 1400  piperacillin-tazobactam (ZOSYN) IVPB 3.375 g  Status:  Discontinued        3.375 g 12.5 mL/hr over 240 Minutes Intravenous Every 8 hours 01/16/23 1322 01/17/23 1428   01/14/23 1000  cefTRIAXone (ROCEPHIN) 1 g in sodium chloride 0.9 % 100 mL IVPB  Status:  Discontinued        1 g 200 mL/hr over 30 Minutes Intravenous Every 24 hours 01/14/23 0925 01/16/23 1322   01/09/23 0930  cefTRIAXone (ROCEPHIN) 1 g in sodium chloride 0.9 % 100 mL IVPB        1 g 200 mL/hr over 30 Minutes Intravenous Every 24 hours 01/09/23 0840 01/11/23 1253        I have personally reviewed the following labs and images: CBC: Recent Labs  Lab 01/27/23 0435 01/28/23 0913 01/29/23 1504 01/30/23 0353 01/31/23 0351  WBC 6.0 18.2* 10.2 10.2 10.9*  NEUTROABS  --   --  8.7*  --   --   HGB 8.0* 8.8* 7.5* 7.3* 7.2*  HCT 24.8* 27.7* 22.8* 22.2* 21.8*  MCV 84.1 83.9 81.7 82.5 83.8  PLT 238 216 216 205 247   BMP &GFR Recent Labs  Lab 01/26/23 0407 01/27/23 0435 01/28/23 0320 01/28/23 0913  01/29/23 0319 01/29/23 1504 01/30/23 0353 01/31/23 0351  NA 133* 129*  --  132*  --  134* 133* 133*  K 3.9 3.3*  --  4.4  --  4.1 3.9 4.0  CL 111 105  --  105  --  106 108 108  CO2 16* 17*  --  14*  --  16* 18* 19*  GLUCOSE 142* 118*  --  111*  --  118* 90 103*  BUN 7* 13  --  17  --  22 21 22   CREATININE 0.83 1.09*  --  0.98 0.88 0.82 0.73 0.69  CALCIUM 8.4* 7.9*  --  8.0*  --  7.9* 7.7* 7.8*  MG 1.6* 2.4 2.0  --  1.9 1.8 1.6* 1.9  PHOS 3.3 2.5  --   --   --  1.0* 2.9 2.7   Estimated Creatinine Clearance: 77.2 mL/min (by C-G formula based on SCr of 0.69 mg/dL). Liver & Pancreas: Recent Labs  Lab 01/25/23 0308 01/26/23 0407 01/27/23 0435 01/28/23 0913 01/29/23 1504 01/30/23 0353 01/31/23 0351  AST 18 56* 31 22 17   --   --   ALT 15 26 19 18 14   --   --   ALKPHOS 84 93 96 130* 146*  --   --   BILITOT 0.5 0.5 0.7 0.5 0.6  --   --   PROT 4.7* 5.1* 4.9* 5.1* 5.0*  --   --   ALBUMIN 1.8* 2.3* 2.1* 1.9* 1.8* 1.7* 1.6*   No results for input(s): "LIPASE", "AMYLASE" in the last 168 hours. Recent Labs  Lab 01/28/23 2025  AMMONIA <10   Diabetic: No results for input(s): "HGBA1C" in the last 72 hours. Recent Labs  Lab 01/31/23 0442 01/31/23 0444 01/31/23 0556 01/31/23 0829 01/31/23 1155  GLUCAP 46* 63* 130* 102* 118*   Cardiac Enzymes: No results for input(s): "CKTOTAL", "CKMB", "CKMBINDEX", "TROPONINI" in the last 168 hours. No results for input(s): "PROBNP" in the last 8760 hours. Coagulation Profile: No results for input(s): "INR", "PROTIME" in the last 168 hours. Thyroid Function Tests: Recent Labs    01/28/23 2025  TSH 1.513   Lipid Profile: No results for input(s): "CHOL", "HDL", "LDLCALC", "TRIG", "CHOLHDL", "LDLDIRECT" in the last 72 hours. Anemia Panel: Recent Labs    01/28/23 2025  VITAMINB12 663   Urine analysis:    Component Value Date/Time   COLORURINE YELLOW 01/28/2023 1720   APPEARANCEUR CLEAR 01/28/2023 1720   LABSPEC 1.010 01/28/2023  1720   PHURINE 6.0 01/28/2023 1720   GLUCOSEU NEGATIVE 01/28/2023 1720   HGBUR NEGATIVE 01/28/2023 1720   BILIRUBINUR NEGATIVE 01/28/2023 1720   KETONESUR NEGATIVE 01/28/2023 1720   PROTEINUR NEGATIVE 01/28/2023 1720   NITRITE NEGATIVE 01/28/2023 1720   LEUKOCYTESUR NEGATIVE 01/28/2023 1720   Sepsis Labs: Invalid input(s): "PROCALCITONIN", "LACTICIDVEN"  Microbiology: Recent Results (from the past 240 hour(s))  Culture, blood (Routine X 2) w Reflex to ID Panel     Status: None (Preliminary result)   Collection Time: 01/28/23  1:16 PM   Specimen: BLOOD LEFT HAND  Result Value Ref Range Status   Specimen Description BLOOD LEFT HAND  Final   Special Requests   Final    BOTTLES DRAWN AEROBIC AND ANAEROBIC Blood Culture results may not be optimal due to an inadequate volume of blood received in culture bottles   Culture   Final    NO GROWTH 3 DAYS Performed at [facility], [address], [place], [place] [address]    Report Status PENDING  Incomplete  Culture, blood (Routine X 2) w Reflex to ID Panel     Status: None (Preliminary result)   Collection Time: 01/28/23  7:01 PM   Specimen: BLOOD LEFT ARM  Result Value Ref Range Status   Specimen Description BLOOD LEFT ARM  Final   Special Requests   Final    BOTTLES DRAWN AEROBIC AND ANAEROBIC Blood Culture adequate volume   Culture   Final  NO GROWTH 3 DAYS Performed at [facility], [address], [place], [place] [address]    Report Status PENDING  Incomplete    Radiology Studies: No results found.    Markeita Alicia T. Mekhai Venuto [facility]  If 7PM-7AM, please contact night-coverage [email or web address] 01/31/2023, 1:14 PM

## 2023-01-31 NOTE — Progress Notes (Signed)
Nutrition Follow-up / Consult  DOCUMENTATION CODES:   Not applicable  INTERVENTION:   TF via PEG:  Continue Jevity 1.5, increase goal rate to 45 ml/h. Prosource TF20 60 ml once daily. Free water flushes 100 ml q 8 hours. Provides 1700 kcal, 89 gm protein, 1121 ml free water daily.  Offer Glucerna Shake po TID, each supplement provides 220 kcal and 10 grams of protein.  Carnation Breakfast Essentials BID, each packet mixed with 8 ounces of 2% milk provides 13 grams of protein and 260 calories.  MVI BID.  When able to resume calcium supplement, recommend TUMS (calcium carbonate) TID.  NUTRITION DIAGNOSIS:   Increased nutrient needs related to wound healing as evidenced by estimated needs.  Ongoing   GOAL:   Patient will meet greater than or equal to 90% of their needs  Met with TF + PO intake  MONITOR:   PO intake, Supplement acceptance  REASON FOR ASSESSMENT:   Consult Assessment of nutrition requirement/status, Calorie Count  ASSESSMENT:   72 yo female admitted with severe dehydration, AKI. 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  Current TF order: Jevity 1.5 at 40 ml/h via PEG with Prosource TF20 60 ml once daily and free water flushes 100 ml every 8 hours. This provides 1520 kcal, 80 gm protein, 1032 ml free water daily.   Patient remains on a regular diet with Glucerna shake TID and Carnation Breakfast essentials BID, but is refusing to eat meals and drink supplements.   TF is meeting 100% of her kcal and protein needs. She is also now receiving 20 gm fiber per day in TF formula.    Labs reviewed. Na 133 CBG: 46-63-130  IVF: D10 at 50 ml/h   Medications reviewed and include vitamin D3, Colace, ferrous sulfate, folic acid, Remeron, MVI with minerals BID, thiamine. No longer receiving calcium supplements (TUMS).  Weight trending up since admission with edema. Patient with generalized edema per RN assessment.    Diet Order:   Diet Order             Diet regular Room service appropriate? Yes with Assist; Fluid consistency: Thin  Diet effective now                   EDUCATION NEEDS:   Education needs have been addressed  Skin:  Skin Assessment: Skin Integrity Issues: Skin Integrity Issues:: Stage II, Stage III Stage II: coccyx, 8 areas to L & R buttocks, L thigh Stage III: 2 areas to R buttocks  Last BM:  10/6 type 7  Height:   Ht Readings from Last 1 Encounters:  01/25/23 5\' 1"  (1.549 m)    Weight:   Wt Readings from Last 1 Encounters:  01/31/23 112.6 kg    Ideal Body Weight:  47.7 kg  BMI:  Body mass index is 46.9 kg/m.  Estimated Nutritional Needs:   Kcal:  1500-1700  Protein:  75-90g  Fluid:  1.5-2 L   Gabriel Rainwater RD, LDN, CNSC Please refer to Amion for contact information.

## 2023-01-31 NOTE — Plan of Care (Signed)
Problem: Education: Goal: Ability to state signs and symptoms to report to health care provider will improve 01/31/2023 1723 by [place], RN Outcome: Progressing 01/31/2023 1723 by [place], RN Outcome: Progressing Goal: Knowledge of the prescribed self-care regimen will improve 01/31/2023 1723 by [place], RN Outcome: Progressing 01/31/2023 1723 by [place], RN Outcome: Progressing Goal: Knowledge of discharge needs will improve 01/31/2023 1723 by [place], RN Outcome: Progressing 01/31/2023 1723 by [place], RN Outcome: Progressing   Problem: Activity: Goal: Ability to tolerate increased activity will improve 01/31/2023 1723 by [place], RN Outcome: Progressing 01/31/2023 1723 by [place], RN Outcome: Progressing   Problem: Bowel/Gastric: Goal: Gastrointestinal status for postoperative course will improve 01/31/2023 1723 by [place], RN Outcome: Progressing 01/31/2023 1723 by [place], RN Outcome: Progressing Goal: Occurrences of nausea will decrease 01/31/2023 1723 by [place], RN Outcome: Progressing 01/31/2023 1723 by [place], RN Outcome: Progressing   Problem: Coping: Goal: Development of coping mechanisms to deal with changes in body function or appearance will improve 01/31/2023 1723 by [place], RN Outcome: Progressing 01/31/2023 1723 by [place], RN Outcome: Progressing   Problem: Fluid Volume: Goal: Maintenance of adequate hydration will improve 01/31/2023 1723 by [place], RN Outcome: Progressing 01/31/2023 1723 by [place], RN Outcome: Progressing   Problem: Nutritional: Goal: Nutritional status will improve 01/31/2023 1723 by [place], RN Outcome: Progressing 01/31/2023 1723 by [place], RN Outcome: Progressing   Problem: Clinical Measurements: Goal: Will show no signs or symptoms of  venous thromboembolism 01/31/2023 1723 by [place], RN Outcome: Progressing 01/31/2023 1723 by [place], RN Outcome: Progressing Goal: Will remain free from infection 01/31/2023 1723 by [place], RN Outcome: Progressing 01/31/2023 1723 by [place], RN Outcome: Progressing Goal: Will show no signs of GI Leak 01/31/2023 1723 by [place], RN Outcome: Progressing 01/31/2023 1723 by [place], RN Outcome: Progressing   Problem: Respiratory: Goal: Will regain and/or maintain adequate ventilation 01/31/2023 1723 by [place], RN Outcome: Progressing 01/31/2023 1723 by [place], RN Outcome: Progressing   Problem: Pain Management: Goal: Pain level will decrease 01/31/2023 1723 by [place], RN Outcome: Progressing 01/31/2023 1723 by [place], RN Outcome: Progressing   Problem: Skin Integrity: Goal: Demonstration of wound healing without infection will improve 01/31/2023 1723 by [place], RN Outcome: Progressing 01/31/2023 1723 by [place], RN Outcome: Progressing   Problem: Education: Goal: Knowledge of General Education information will improve Description: Including pain rating scale, medication(s)/side effects and non-pharmacologic comfort measures 01/31/2023 1723 by [place], RN Outcome: Progressing 01/31/2023 1723 by [place], RN Outcome: Progressing   Problem: Health Behavior/Discharge Planning: Goal: Ability to manage health-related needs will improve 01/31/2023 1723 by [place], RN Outcome: Progressing 01/31/2023 1723 by [place], RN Outcome: Progressing   Problem: Clinical Measurements: Goal: Ability to maintain clinical measurements within normal limits will improve 01/31/2023 1723 by [place], RN Outcome: Progressing 01/31/2023 1723 by [place], RN Outcome: Progressing Goal: Will remain free from  infection 01/31/2023 1723 by [place], RN Outcome: Progressing 01/31/2023 1723 by [place], RN Outcome: Progressing Goal: Diagnostic test results will improve 01/31/2023 1723 by [place], RN Outcome: Progressing 01/31/2023 1723 by [place], RN Outcome: Progressing Goal: Respiratory complications will improve  01/31/2023 1723 by [place], RN Outcome: Progressing 01/31/2023 1723 by [place], RN Outcome: Progressing Goal: Cardiovascular complication will be avoided 01/31/2023 1723 by [place], RN Outcome: Progressing 01/31/2023 1723 by [place], RN Outcome: Progressing   Problem: Activity: Goal: Risk for activity intolerance will decrease 01/31/2023 1723 by [place], RN Outcome: Progressing 01/31/2023 1723 by [place], RN Outcome: Progressing   Problem: Nutrition: Goal: Adequate nutrition will be maintained 01/31/2023 1723 by [place], RN Outcome: Progressing 01/31/2023 1723 by [place], RN Outcome: Progressing   Problem: Coping: Goal: Level of anxiety will decrease 01/31/2023 1723 by [place], RN Outcome: Progressing 01/31/2023 1723 by [place], RN Outcome: Progressing   Problem: Elimination: Goal: Will not experience complications related to bowel motility 01/31/2023 1723 by [place], RN Outcome: Progressing 01/31/2023 1723 by [place], RN Outcome: Progressing Goal: Will not experience complications related to urinary retention 01/31/2023 1723 by [place], RN Outcome: Progressing 01/31/2023 1723 by [place], RN Outcome: Progressing   Problem: Pain Managment: Goal: General experience of comfort will improve 01/31/2023 1723 by [place], RN Outcome: Progressing 01/31/2023 1723 by [place], RN Outcome: Progressing   Problem: Safety: Goal: Ability to remain free from injury will  improve 01/31/2023 1723 by [place], RN Outcome: Progressing 01/31/2023 1723 by [place], RN Outcome: Progressing   Problem: Skin Integrity: Goal: Risk for impaired skin integrity will decrease 01/31/2023 1723 by [place], RN Outcome: Progressing 01/31/2023 1723 by [place], RN Outcome: Progressing

## 2023-01-31 NOTE — Progress Notes (Signed)
Hypoglycemic Event  CBG: 63  Treatment: D50 25 mL (12.5 gm)  Symptoms: None  Follow-up CBG: Time:0556 CBG Result:130  Possible Reasons for Event: Unknown  Comments/MD notified:    Elbia Paro  Steffanie Dunn

## 2023-01-31 NOTE — Progress Notes (Addendum)
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  Has had multiple hospitalizations and ED visits in the past several months.  Postoperative course complicated by very poor oral intake, weakness, currently residing in an SNF.  She has refused to eat, drink, and take her medications.  She was admitted to [facility] with confusion.  GI was consulted and did not find an organic cause for her not eating.  After discussing with the family, a feeding tube was recommended and she was transferred to [facility] for surgery consultation. Palliative care has been asked to continue involvement to support patient and further address goals of care as it relates to possible PEG placement,   Today's Discussion 01/31/2023  *Please note that this is a verbal dictation therefore any spelling or grammatical errors are due to the "Dragon Medical One" system interpretation.  Chart reviewed inclusive of vital signs, progress notes, laboratory results, and diagnostic images.   I met with Christine [name] at bedside this early morning.We took the time to review and complete the MOST form as below:  Cardiopulmonary Resuscitation: Do Not Attempt Resuscitation (DNR/No CPR)  Medical Interventions: Limited Additional Interventions: Use medical treatment, IV fluids and cardiac monitoring as indicated, DO NOT USE intubation or mechanical ventilation. May consider use of less invasive airway support such as BiPAP or CPAP. Also provide comfort measures. Transfer to the hospital if indicated. Avoid intensive care.   Antibiotics: Antibiotics if indicated  IV Fluids: IV fluids if indicated  Feeding Tube: Feeding tube long-term if indicated   We reviewed that Christine [name] continues to have incremental low blood sugar levels. I shared the plan for the  dietician to see her for additional recommendations.  Christine [name] remains willing to work with PT/OT.  Questions and concerns addressed/Palliative Support Provided.  _________________ Addendum:  I have called and updated patients sister, Christine [name] per nursing request.  Objective Assessment: Vital Signs Vitals:   01/31/23 0449 01/31/23 0831  BP: 132/83 136/71  Pulse:  (!) 107  Resp: 15 17  Temp: 98.2 F (36.8 C) 97.7 F (36.5 C)  SpO2:  100%    Intake/Output Summary (Last 24 hours) at 01/31/2023 1226 Last data filed at 01/31/2023 0900 Gross per 24 hour  Intake 650 ml  Output 100 ml  Net 550 ml   Last Weight  Most recent update: 01/31/2023  5:16 AM    Weight  112.6 kg (248 lb 3.8 oz)            Gen:  Elderly AA F in NAD HEENT: Dry mucous membranes CV: Regular rate and rhythm  PULM: On RA, breathing is even and nonlabored ABD: (+) PEG EXT: (+) edema  Neuro: Alert and oriented x3   SUMMARY OF RECOMMENDATIONS   DNAR/DNI  MOST/DNR Form Completed, paper copy placed onto the chart electric copy can be found in Vynca  Allowing time for outcomes  Appreciate PT/OT for mobility efforts  Ongoing Chaplain support  Appreciate TOC helping with advanced care planning  Incremental PMT support   Billing based on MDM: [facility] ______________________________________________________________________________________ Lamarr Lulas [facility] Palliative Medicine Team Team Cell Phone: [phone] Please utilize secure chat with additional questions, if there is no response within 30 minutes please call the above phone number  Palliative Medicine Team providers are available by phone from 7am to 7pm daily and can be  reached through the team cell phone.  Should this patient require assistance outside of these hours, please call the patient's attending physician.

## 2023-02-01 DIAGNOSIS — G9341 Metabolic encephalopathy: Secondary | ICD-10-CM | POA: Diagnosis not present

## 2023-02-01 DIAGNOSIS — Z515 Encounter for palliative care: Secondary | ICD-10-CM | POA: Diagnosis not present

## 2023-02-01 DIAGNOSIS — I10 Essential (primary) hypertension: Secondary | ICD-10-CM | POA: Diagnosis not present

## 2023-02-01 DIAGNOSIS — Z7189 Other specified counseling: Secondary | ICD-10-CM | POA: Diagnosis not present

## 2023-02-01 DIAGNOSIS — E162 Hypoglycemia, unspecified: Secondary | ICD-10-CM | POA: Diagnosis not present

## 2023-02-01 DIAGNOSIS — R627 Adult failure to thrive: Secondary | ICD-10-CM | POA: Diagnosis not present

## 2023-02-01 DIAGNOSIS — E870 Hyperosmolality and hypernatremia: Secondary | ICD-10-CM | POA: Diagnosis not present

## 2023-02-01 LAB — GLUCOSE, CAPILLARY
Glucose-Capillary: 102 mg/dL — ABNORMAL HIGH (ref 70–99)
Glucose-Capillary: 105 mg/dL — ABNORMAL HIGH (ref 70–99)
Glucose-Capillary: 61 mg/dL — ABNORMAL LOW (ref 70–99)
Glucose-Capillary: 77 mg/dL (ref 70–99)
Glucose-Capillary: 90 mg/dL (ref 70–99)
Glucose-Capillary: 98 mg/dL (ref 70–99)
Glucose-Capillary: 98 mg/dL (ref 70–99)

## 2023-02-01 MED ORDER — OXYBUTYNIN CHLORIDE 5 MG PO TABS
7.5000 mg | ORAL_TABLET | Freq: Two times a day (BID) | ORAL | Status: DC
  Administered 2023-02-01 – 2023-02-04 (×6): 7.5 mg
  Filled 2023-02-01 (×8): qty 1.5

## 2023-02-01 MED ORDER — THIAMINE MONONITRATE 100 MG PO TABS
100.0000 mg | ORAL_TABLET | Freq: Every day | ORAL | Status: DC
  Administered 2023-02-02 – 2023-02-04 (×3): 100 mg
  Filled 2023-02-01 (×3): qty 1

## 2023-02-01 MED ORDER — DEXTROSE 50 % IV SOLN
12.5000 g | INTRAVENOUS | Status: AC
  Administered 2023-02-01: 12.5 g via INTRAVENOUS
  Filled 2023-02-01: qty 50

## 2023-02-01 NOTE — Progress Notes (Signed)
7 Days Post-Op  Subjective: No major changes  Objective: Vital signs in last 24 hours: Temp:  [97.7 F (36.5 C)-99.5 F (37.5 C)] 99.2 F (37.3 C) (10/08 0433) Pulse Rate:  [97-107] 97 (10/08 0433) Resp:  [17-20] 18 (10/08 0433) BP: (135-147)/(69-73) 147/73 (10/08 0433) SpO2:  [100 %] 100 % (10/08 0433) Weight:  [104 kg] 104 kg (10/08 0442) Last BM Date : 01/31/23  Intake/Output from previous day: 10/07 0701 - 10/08 0700 In: 2394.2 [P.O.:180; I.V.:698.9; NG/GT:1515.3] Out: 300 [Urine:300] Intake/Output this shift: Total I/O In: 1635.3 [P.O.:120; NG/GT:1515.3] Out: 300 [Urine:300]  PE: Gen:  Alert, NAD, pleasant Abd: Soft, ND, NT, +BS, incisions well healed. G tube functioning   Lab Results:  Recent Labs    01/30/23 0353 01/31/23 0351  WBC 10.2 10.9*  HGB 7.3* 7.2*  HCT 22.2* 21.8*  PLT 205 247   BMET Recent Labs    01/30/23 0353 01/31/23 0351  NA 133* 133*  K 3.9 4.0  CL 108 108  CO2 18* 19*  GLUCOSE 90 103*  BUN 21 22  CREATININE 0.73 0.69  CALCIUM 7.7* 7.8*   PT/INR No results for input(s): "LABPROT", "INR" in the last 72 hours. CMP     Component Value Date/Time   NA 133 (L) 01/31/2023 0351   K 4.0 01/31/2023 0351   CL 108 01/31/2023 0351   CO2 19 (L) 01/31/2023 0351   GLUCOSE 103 (H) 01/31/2023 0351   BUN 22 01/31/2023 0351   CREATININE 0.69 01/31/2023 0351   CREATININE 0.91 07/26/2017 1127   CALCIUM 7.8 (L) 01/31/2023 0351   PROT 5.0 (L) 01/29/2023 1504   ALBUMIN 1.6 (L) 01/31/2023 0351   AST 17 01/29/2023 1504   AST 15 07/26/2017 1127   ALT 14 01/29/2023 1504   ALT 14 07/26/2017 1127   ALKPHOS 146 (H) 01/29/2023 1504   BILITOT 0.6 01/29/2023 1504   BILITOT 0.5 07/26/2017 1127   GFRNONAA >60 01/31/2023 0351   GFRNONAA >60 07/26/2017 1127   GFRAA >60 02/28/2019 1022   GFRAA >60 07/26/2017 1127   Lipase     Component Value Date/Time   LIPASE 22 11/08/2022 2335    Studies/Results: No results  found.  Anti-infectives: Anti-infectives (From admission, onward)    Start     Dose/Rate Route Frequency Ordered Stop   01/25/23 1309  ceFAZolin (ANCEF) IVPB 2g/100 mL premix        2 g 200 mL/hr over 30 Minutes Intravenous 30 min pre-op 01/25/23 1309 01/25/23 1745   01/20/23 1430  meropenem (MERREM) 1 g in sodium chloride 0.9 % 100 mL IVPB  Status:  Discontinued        1 g 200 mL/hr over 30 Minutes Intravenous Every 8 hours 01/20/23 1338 01/24/23 0801   01/19/23 1700  meropenem (MERREM) 1 g in sodium chloride 0.9 % 100 mL IVPB  Status:  Discontinued        1 g 200 mL/hr over 30 Minutes Intravenous Every 12 hours 01/19/23 1345 01/20/23 1338   01/17/23 1515  meropenem (MERREM) 1 g in sodium chloride 0.9 % 100 mL IVPB  Status:  Discontinued        1 g 200 mL/hr over 30 Minutes Intravenous Every 8 hours 01/17/23 1428 01/19/23 1345   01/16/23 1400  piperacillin-tazobactam (ZOSYN) IVPB 3.375 g  Status:  Discontinued        3.375 g 12.5 mL/hr over 240 Minutes Intravenous Every 8 hours 01/16/23 1322 01/17/23 1428   01/14/23 1000  cefTRIAXone (ROCEPHIN) 1 g in sodium chloride 0.9 % 100 mL IVPB  Status:  Discontinued        1 g 200 mL/hr over 30 Minutes Intravenous Every 24 hours 01/14/23 0925 01/16/23 1322   01/09/23 0930  cefTRIAXone (ROCEPHIN) 1 g in sodium chloride 0.9 % 100 mL IVPB        1 g 200 mL/hr over 30 Minutes Intravenous Every 24 hours 01/09/23 0840 01/11/23 1253      CT 9/14 - Distended gallbladder. If there is concern of gallbladder pathology ultrasound may be useful as the next step in the workup. - Grossly stable left-sided renal cystic foci. - Surgical changes of previous gastric bypass - Markedly limited examination due to the level of motion. No obvious obstruction, free air or free fluid. In addition, a repeat study could be considered when the patient is more clinically able.  RUQ [place] 9/15 1. Hepatic fatty infiltration. 2. Gallbladder distended. 3. Minimal  gallbladder sludge. 4. Echogenic kidneys consistent with chronic medical renal disease. 5. Left kidney cyst. 6. There was limited visualization, as described.  Esophagram 9/20  1. 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 Further evaluation with endoscopy is recommended. 2. Significant delay in swallow initiation/mechanism. Prominent cricopharyngeal bar. 3. Advanced esophageal dysmotility with spasms. 4. Limited study due to patient's body habitus and mobility limitations.   CT chest 9/21 1. No acute cardiopulmonary process (no obvious extrinsic esophageal mass) 2. Minimal atelectasis in the left lower lobe.  HIDA 9/23 1.  Patent cystic and common bile ducts. 2. Elevated gallbladder ejection fraction as can be seen with gallbladder hyperkinesis.  Intra-op IOC and EGD 01/25/23 normal   Assessment/Plan Christine [name] is a 72 y.o. female with hx of robotic gastric bypass on 09/14/2022 by Dr. Dossie Der on 09/14/22 who is here with FTT - Unsure etiology of mood and mentation issues postoperatively, may have been due to thiamine deficiency, change in antidepressant absorption after gastric bypass, UTI?   S/p lap chole, lap remnant g tube and normal EGD 01/25/23  - Tube feeds as tolerated, monitor electrolytes - Okay for diet as tolerated - I would recommend milk or protein shakes by mouth to correct hypoglycemia as juice by mouth may cause dumping syndrome, worsening glycemic control.  Okay to give higher sugar content via gastrostomy tube as this is in the remnant stomach and should have more even absorption.    - Patient will need safe discharge plan - Appreciate psych team input as she is demonstrating self harm by refusing to eat.  She will need connected to continued psychiatric support after discharge  Surgery team available as needed   LOS: 24 days   Quentin Ore, MD [facility] Surgery 02/01/2023, 6:59 AM Please see Amion for pager number during day hours 7:00am-4:30pm

## 2023-02-01 NOTE — Progress Notes (Signed)
Patient using home CPAP unit independently. Told patient to call if she needed anything.

## 2023-02-01 NOTE — Progress Notes (Signed)
This chaplain is present for F/U spiritual care. The Pt. wakes up to a gentle call of her name and agrees to a visit. The Pt. remained awake throughout the visit. Family is not at the bedside.  The chaplain observed a change in the Pt. facial expression about every five minutes. The Pt. shared the change in the facial expression matches the "constriction" she is feeling in her abdomen. The Pt. continued to engage in the visit when the constriction passed.  The Pt. took the lead in carrying the conversation and the choosing of talking points with the chaplain. Today's conversation respectfully flowed the majority of the time and took a few tangential turns to another topic.  The Pt. accepted the chaplain's blessing for the remainder of the day.  Chaplain Stephanie Acre [phone]

## 2023-02-01 NOTE — Progress Notes (Signed)
Pt refusing cpap tonight, educated on use

## 2023-02-01 NOTE — Progress Notes (Signed)
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 Has had multiple hospitalizations and ED visits in the past several months. Postoperative course complicated by very poor oral intake, weakness, currently residing in an SNF. She has refused to eat, drink, and take her medications. She was admitted to [facility] with confusion. GI was consulted and did not find an organic cause for her not eating. After discussing with the family, a feeding tube was recommended and she was transferred to [facility] for surgery consultation.   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 GI consulted, they do not appreciate this being an issue for which she could not eat.  After discussions with family, she was transferred to [facility] for surgical evaluation for gastrostomy tube.  She was evaluated by psychiatry.    Patient had PEG tube and laparoscopic cholecystectomy with IOC on 10/1.  Started on tube feed.  Also on D10 infusion due to recurrent hypoglycemia.  Guarded prognosis.  Palliative medicine following.  Subjective: Seen and examined earlier this morning.  Mild hypoglycemia to 61 about midnight despite tube feed at goal and D10 infusion at 50 cc an hour.  No complaints.  She is awake, alert and oriented x 4 today  Objective: Vitals:   01/31/23 1945 02/01/23 0433 02/01/23 0442 02/01/23 0746  BP: (!) (P)  143/79 (!) 147/73  (!) 145/90  Pulse:  97  90  Resp: (P) 20 18  16   Temp: (P) 99.5 F (37.5 C) 99.2 F (37.3 C)  (!) 97 F (36.1 C)  TempSrc: (P) Oral Oral  Oral  SpO2:  100%  100%  Weight:   104 kg   Height:        Examination:  GENERAL: No apparent distress.  Nontoxic. HEENT: MMM.  Vision and hearing grossly intact.  NECK: Supple.  No apparent JVD.  RESP:  No IWOB.  Fair aeration bilaterally. CVS:  RRR. Heart sounds normal.  ABD/GI/GU: BS+. Abd soft.  Nontender.  G-tube in place. MSK/EXT:  Moves extremities. No apparent deformity.  1+ BLE edema.  Venous insufficiency. SKIN: Stage II bilateral buttock wounds without signs of infection. NEURO: awake and alert.  Oriented x 4 today.  Follows commands.  No focal neurodeficit. PSYCH: Calm.  No distress or agitation.  Procedures:  10/1-gastrostomy tube and laparoscopic cholecystectomy  Microbiology summarized: 9/14-COVID-19 PCR nonreactive 9/14-C. difficile negative 9/14-MRSA PCR nonreactive 9/14-GIP negative 9/20-urine culture with ESBL E. Coli 9/22-blood cultures NGTD 10/4-blood culture  Assessment and plan: Adult Failure to Thrive/Esophageal dysmotility, refusing to eat: Patient has no desire to eat.  Unclear if this is due to poor appetite, psychogenic or cognitive deficit.  Evaluated by psychiatry and refused additional meds for depressive symptoms earlier in the course.  Received high-dose thiamine from 9/23-9/24 and 10/2-10/5 -S/p laparoscopic gastrostomy and cholecystectomy with IOC on 10/1 -On continuous and bolus tube feed.  -Remeron was started on 10/2. -Continue home Cymbalta -Dietitian increased  continues to feed to 45 cc an hour. -Continue D10 infusion for refractory hypoglycemia -Regular diet  Acute metabolic encephalopathy: Initially thought to be due to severe hypernatremia.  VBG, ammonia, B12, TSH and RPR unrevealing.  Received high-dose thiamine as above.  Encephalopathy seems to have resolved but waxing  and waning.  She is oriented x 4 today. -Decreased oxycodone on 10/4 -Reorientation and delirium precautions.  SIRS: No clear source of infection yet.  CXR, UA and blood culture unrevealing.  She has bilateral buttock wound but no signs of infection.  No respiratory symptoms.  Hemodynamically stable.  Leukocytosis resolving without antibiotics. -Monitor off antibiotics.   Abdominal pain -9/14 CT abd--limited by motion, but  No obvious obstruction, free air or free fluid; s/p gastric bypass. 9/15.  Abd US showed distended gallbladder with sludge.  LFTs normal.  HIDA scan negative.  -S/p laparoscopic cholecystectomy with IOC on 10/1   Hypokalemia/Hypomagnesemia/Hypophosphatemia -due to FTT.   -Monitor and replace as indicated  Recurrent hypoglycemia: Recurrent hypoglycemia despite tube feed at goal rate.  She is on p.o. Glucerna but poor p.o. intake.  A.m. cortisol and TSH within normal. -Dietitian increased tube feed goal to 45 cc an hour.  -Continue D10 to 50 cc an hour -Continue monitoring CBG  Normocytic anemia: Relatively stable.  No overt bleeding.  Anemia panel suggests anemia of chronic disease. Recent Labs    01/21/23 0400 01/23/23 0402 01/25/23 0308 01/25/23 1829 01/26/23 0407 01/27/23 0435 01/28/23 0913 01/29/23 1504 01/30/23 0353 01/31/23 0351  HGB 8.5* 8.6* 8.5* 8.8* 8.7* 8.0* 8.8* 7.5* 7.3* 7.2*  -Continue monitoring -Optimize nutrition  AKI -baseline Cr ~0.8.  Creatinine slightly up.  Continue monitoring.  ESBL E. coli UTI: Completed 7 days of IV meropenem 09/29.    NAGMA: Stable. -Continue sodium bicarb per tube  Hypernatremia: Resolved.  Hyponatremia: Due to free water?  Improved. -Decreased free water on 10/3.  OSA -Nightly CPAP ordered    Essential hypertension   -Monitor  GERD -continue PPI  Bilateral buttock wounds: Stage II: POA -Wound care per WOC RN -Frequent turning   Goals of care-DNR/DNI. -Palliative medicine following.   Morbid  obesity Body mass index is 43.32 kg/m. Nutrition Problem: Increased nutrient needs Etiology: wound healing Signs/Symptoms: estimated needs Interventions: MVI, Prostat, Glucerna shake    DVT prophylaxis:  enoxaparin (LOVENOX) injection 40 mg Start: 01/08/23 2200  Code Status: DNR/DNI Family Communication: None at bedside. Level of care: Med-Surg Status is: Inpatient Remains inpatient appropriate because: Failure to thrive and recurrent hypoglycemia   Final disposition: SNF Consultants:  GI Psychiatry General Surgery Palliative medicine  35 minutes with more than 50% spent in reviewing records, counseling patient/family and coordinating care.   Sch Meds:  Scheduled Meds:  antiseptic oral rinse  15 mL Mouth Rinse BID   Chlorhexidine Gluconate Cloth  6 each Topical Daily   cholecalciferol  5,000 Units Per Tube Daily   colchicine  0.6 mg Per Tube Daily   docusate  100 mg Per Tube BID   DULoxetine  60 mg Oral Daily   enoxaparin (LOVENOX) injection  40 mg Subcutaneous Q24H   feeding supplement (GLUCERNA SHAKE)  237 mL Oral TID BM   feeding supplement (JEVITY 1.5 CAL/FIBER)  1,000 mL Per Tube Q24H   feeding supplement (PROSource TF20)  60 mL Per Tube Daily   ferrous sulfate  325 mg Oral Q breakfast   folic acid  1 mg Per Tube Daily   free water  100 mL Per Tube Q8H  Gerhardt's butt cream   Topical BID   mirtazapine  7.5 mg Per Tube QHS   multivitamin with minerals  1 tablet Per Tube BID   mouth rinse  15 mL Mouth Rinse TID   oxybutynin  7.5 mg Per Tube BID   pantoprazole (PROTONIX) IV  40 mg Intravenous Q12H   sodium bicarbonate  1,300 mg Per Tube TID   sodium chloride flush  10-40 mL Intracatheter Q12H   [START ON 02/02/2023] thiamine  100 mg Per Tube Daily   Continuous Infusions:  dextrose 50 mL/hr at 01/31/23 1717    PRN Meds:.acetaminophen, diclofenac Sodium, metoprolol tartrate, ondansetron **OR** ondansetron (ZOFRAN) IV, oxyCODONE, simethicone, sodium chloride  flush  Antimicrobials: Anti-infectives (From admission, onward)    Start     Dose/Rate Route Frequency Ordered Stop   01/25/23 1309  ceFAZolin (ANCEF) IVPB 2g/100 mL premix        2 g 200 mL/hr over 30 Minutes Intravenous 30 min pre-op 01/25/23 1309 01/25/23 1745   01/20/23 1430  meropenem (MERREM) 1 g in sodium chloride 0.9 % 100 mL IVPB  Status:  Discontinued        1 g 200 mL/hr over 30 Minutes Intravenous Every 8 hours 01/20/23 1338 01/24/23 0801   01/19/23 1700  meropenem (MERREM) 1 g in sodium chloride 0.9 % 100 mL IVPB  Status:  Discontinued        1 g 200 mL/hr over 30 Minutes Intravenous Every 12 hours 01/19/23 1345 01/20/23 1338   01/17/23 1515  meropenem (MERREM) 1 g in sodium chloride 0.9 % 100 mL IVPB  Status:  Discontinued        1 g 200 mL/hr over 30 Minutes Intravenous Every 8 hours 01/17/23 1428 01/19/23 1345   01/16/23 1400  piperacillin-tazobactam (ZOSYN) IVPB 3.375 g  Status:  Discontinued        3.375 g 12.5 mL/hr over 240 Minutes Intravenous Every 8 hours 01/16/23 1322 01/17/23 1428   01/14/23 1000  cefTRIAXone (ROCEPHIN) 1 g in sodium chloride 0.9 % 100 mL IVPB  Status:  Discontinued        1 g 200 mL/hr over 30 Minutes Intravenous Every 24 hours 01/14/23 0925 01/16/23 1322   01/09/23 0930  cefTRIAXone (ROCEPHIN) 1 g in sodium chloride 0.9 % 100 mL IVPB        1 g 200 mL/hr over 30 Minutes Intravenous Every 24 hours 01/09/23 0840 01/11/23 1253        I have personally reviewed the following labs and images: CBC: Recent Labs  Lab 01/27/23 0435 01/28/23 0913 01/29/23 1504 01/30/23 0353 01/31/23 0351  WBC 6.0 18.2* 10.2 10.2 10.9*  NEUTROABS  --   --  8.7*  --   --   HGB 8.0* 8.8* 7.5* 7.3* 7.2*  HCT 24.8* 27.7* 22.8* 22.2* 21.8*  MCV 84.1 83.9 81.7 82.5 83.8  PLT 238 216 216 205 247   BMP &GFR Recent Labs  Lab 01/26/23 0407 01/27/23 0435 01/28/23 0320 01/28/23 0913 01/29/23 0319 01/29/23 1504 01/30/23 0353 01/31/23 0351  NA 133* 129*  --   132*  --  134* 133* 133*  K 3.9 3.3*  --  4.4  --  4.1 3.9 4.0  CL 111 105  --  105  --  106 108 108  CO2 16* 17*  --  14*  --  16* 18* 19*  GLUCOSE 142* 118*  --  111*  --  118* 90 103*  BUN 7* 13  --  17  --  22 21 22   CREATININE 0.83 1.09*  --  0.98 0.88 0.82 0.73 0.69  CALCIUM 8.4* 7.9*  --  8.0*  --  7.9* 7.7* 7.8*  MG 1.6* 2.4 2.0  --  1.9 1.8 1.6* 1.9  PHOS 3.3 2.5  --   --   --  1.0* 2.9 2.7   Estimated Creatinine Clearance: 73.7 mL/min (by C-G formula based on SCr of 0.69 mg/dL). Liver & Pancreas: Recent Labs  Lab 01/26/23 0407 01/27/23 0435 01/28/23 0913 01/29/23 1504 01/30/23 0353 01/31/23 0351  AST 56* 31 22 17   --   --   ALT 26 19 18 14   --   --   ALKPHOS 93 96 130* 146*  --   --   BILITOT 0.5 0.7 0.5 0.6  --   --   PROT 5.1* 4.9* 5.1* 5.0*  --   --   ALBUMIN 2.3* 2.1* 1.9* 1.8* 1.7* 1.6*   No results for input(s): "LIPASE", "AMYLASE" in the last 168 hours. Recent Labs  Lab 01/28/23 2025  AMMONIA <10   Diabetic: No results for input(s): "HGBA1C" in the last 72 hours. Recent Labs  Lab 02/01/23 0001 02/01/23 0115 02/01/23 0422 02/01/23 0828 02/01/23 1159  GLUCAP 61* 105* 98 90 102*   Cardiac Enzymes: No results for input(s): "CKTOTAL", "CKMB", "CKMBINDEX", "TROPONINI" in the last 168 hours. No results for input(s): "PROBNP" in the last 8760 hours. Coagulation Profile: No results for input(s): "INR", "PROTIME" in the last 168 hours. Thyroid Function Tests: No results for input(s): "TSH", "T4TOTAL", "FREET4", "T3FREE", "THYROIDAB" in the last 72 hours.  Lipid Profile: No results for input(s): "CHOL", "HDL", "LDLCALC", "TRIG", "CHOLHDL", "LDLDIRECT" in the last 72 hours. Anemia Panel: No results for input(s): "VITAMINB12", "FOLATE", "FERRITIN", "TIBC", "IRON", "RETICCTPCT" in the last 72 hours.  Urine analysis:    Component Value Date/Time   COLORURINE YELLOW 01/28/2023 1720   APPEARANCEUR CLEAR 01/28/2023 1720   LABSPEC 1.010 01/28/2023 1720    PHURINE 6.0 01/28/2023 1720   GLUCOSEU NEGATIVE 01/28/2023 1720   HGBUR NEGATIVE 01/28/2023 1720   BILIRUBINUR NEGATIVE 01/28/2023 1720   KETONESUR NEGATIVE 01/28/2023 1720   PROTEINUR NEGATIVE 01/28/2023 1720   NITRITE NEGATIVE 01/28/2023 1720   LEUKOCYTESUR NEGATIVE 01/28/2023 1720   Sepsis Labs: Invalid input(s): "PROCALCITONIN", "LACTICIDVEN"  Microbiology: Recent Results (from the past 240 hour(s))  Culture, blood (Routine X 2) w Reflex to ID Panel     Status: None (Preliminary result)   Collection Time: 01/28/23  1:16 PM   Specimen: BLOOD LEFT HAND  Result Value Ref Range Status   Specimen Description BLOOD LEFT HAND  Final   Special Requests   Final    BOTTLES DRAWN AEROBIC AND ANAEROBIC Blood Culture results may not be optimal due to an inadequate volume of blood received in culture bottles   Culture   Final    NO GROWTH 4 DAYS Performed at [facility], [address], [place], [place] [address]    Report Status PENDING  Incomplete  Culture, blood (Routine X 2) w Reflex to ID Panel     Status: None (Preliminary result)   Collection Time: 01/28/23  7:01 PM   Specimen: BLOOD LEFT ARM  Result Value Ref Range Status   Specimen Description BLOOD LEFT ARM  Final   Special Requests   Final    BOTTLES DRAWN AEROBIC AND ANAEROBIC Blood Culture adequate volume   Culture   Final    NO GROWTH 4 DAYS  Performed at [facility], [address] [place], [place] [address]    Report Status PENDING  Incomplete    Radiology Studies: No results found.    Gayl Ivanoff T. Farron Lafond [facility]  If 7PM-7AM, please contact night-coverage [email or web address] 02/01/2023, 3:07 PM

## 2023-02-01 NOTE — Progress Notes (Signed)
   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  Has had multiple hospitalizations and ED visits in the past several months.  Postoperative course complicated by very poor oral intake, weakness, currently residing in an SNF.  She has refused to eat, drink, and take her medications.  She was admitted to [facility] with confusion.  GI was consulted and did not find an organic cause for her not eating.  After discussing with the family, a feeding tube was recommended and she was transferred to [facility] for surgery consultation. Palliative care has been asked to continue involvement to support patient and further address goals of care as it relates to possible PEG placement,   Today's Discussion 02/01/2023  *Please note that this is a verbal dictation therefore any spelling or grammatical errors are due to the "Dragon Medical One" system interpretation.  Chart reviewed inclusive of vital signs, progress notes, laboratory results, and diagnostic images.   I met with [name] at bedside this morning. She was receiving medications by the nursing staff. She denies nausea or shortness of breath though does share some pain at her G-Tube site. Per nursing she had just received some pain medications which did appear effective.  I discussed [name]'s present blood sugar levels.  We reviewed the plan for transition to [facility] once medically stable.   Questions addressed and Palliative support provided.  Objective Assessment: Vital Signs Vitals:   02/01/23 0433 02/01/23 0746  BP: (!) 147/73 (!) 145/90  Pulse: 97 90  Resp: 18 16  Temp: 99.2 F (37.3 C) (!) 97 F (36.1 C)  SpO2: 100% 100%    Intake/Output Summary (Last 24 hours) at 02/01/2023 0951 Last data filed at 02/01/2023 4782 Gross per 24 hour   Intake 2344.19 ml  Output 300 ml  Net 2044.19 ml   Last Weight  Most recent update: 02/01/2023  4:42 AM    Weight  104 kg (229 lb 4.5 oz)            Gen:  Elderly AA F in NAD HEENT: Dry mucous membranes CV: Regular rate and rhythm  PULM: On RA, breathing is even and nonlabored ABD: (+) PEG EXT: (+) edema  Neuro: Alert and oriented x3   SUMMARY OF RECOMMENDATIONS   DNAR/DNI  MOST/DNR Form Completed, paper copy placed onto the chart electric copy can be found in Vynca  Allowing time for outcomes  Appreciate PT/OT for mobility efforts  Ongoing Chaplain support  Plan for placement at [place] once medically optimized  Incremental PMT support   Time: 25 ______________________________________________________________________________________ Lamarr Lulas [place] Palliative Medicine Team Team Cell Phone: [phone] Please utilize secure chat with additional questions, if there is no response within 30 minutes please call the above phone number  Palliative Medicine Team providers are available by phone from 7am to 7pm daily and can be reached through the team cell phone.  Should this patient require assistance outside of these hours, please call the patient's attending physician.

## 2023-02-01 NOTE — Progress Notes (Signed)
Pt BS reading is 61, hypoglycemic standing orders activated, pt is given juice and crackers but refusing to eat at this time, NPO standing hypo orders triggered, D5 given per orders, on call provider made aware, rechecked at 105

## 2023-02-01 NOTE — Plan of Care (Signed)
  Problem: Education: Goal: Knowledge of the prescribed self-care regimen will improve Outcome: Progressing

## 2023-02-02 ENCOUNTER — Ambulatory Visit: Payer: Medicare HMO | Admitting: Podiatry

## 2023-02-02 DIAGNOSIS — Z7189 Other specified counseling: Secondary | ICD-10-CM | POA: Diagnosis not present

## 2023-02-02 DIAGNOSIS — Z515 Encounter for palliative care: Secondary | ICD-10-CM | POA: Diagnosis not present

## 2023-02-02 DIAGNOSIS — R627 Adult failure to thrive: Secondary | ICD-10-CM | POA: Diagnosis not present

## 2023-02-02 LAB — RENAL FUNCTION PANEL
Albumin: 1.6 g/dL — ABNORMAL LOW (ref 3.5–5.0)
Anion gap: 8 (ref 5–15)
BUN: 15 mg/dL (ref 8–23)
CO2: 20 mmol/L — ABNORMAL LOW (ref 22–32)
Calcium: 8 mg/dL — ABNORMAL LOW (ref 8.9–10.3)
Chloride: 104 mmol/L (ref 98–111)
Creatinine, Ser: 0.62 mg/dL (ref 0.44–1.00)
GFR, Estimated: >60 mL/min (ref >60)
Glucose, Bld: 103 mg/dL — ABNORMAL HIGH (ref 70–99)
Phosphorus: 2.2 mg/dL — ABNORMAL LOW (ref 2.5–4.6)
Potassium: 3.7 mmol/L (ref 3.5–5.1)
Sodium: 132 mmol/L — ABNORMAL LOW (ref 135–145)

## 2023-02-02 LAB — CBC
HCT: 21.3 % — ABNORMAL LOW (ref 36.0–46.0)
Hemoglobin: 7 g/dL — ABNORMAL LOW (ref 12.0–15.0)
MCH: 27.8 pg (ref 26.0–34.0)
MCHC: 32.9 g/dL (ref 30.0–36.0)
MCV: 84.5 fL (ref 80.0–100.0)
Platelets: 300 10*3/uL (ref 150–400)
RBC: 2.52 MIL/uL — ABNORMAL LOW (ref 3.87–5.11)
RDW: 20.2 % — ABNORMAL HIGH (ref 11.5–15.5)
WBC: 10.3 10*3/uL (ref 4.0–10.5)
nRBC: 0 % (ref 0.0–0.2)

## 2023-02-02 LAB — GLUCOSE, CAPILLARY
Glucose-Capillary: 100 mg/dL — ABNORMAL HIGH (ref 70–99)
Glucose-Capillary: 79 mg/dL (ref 70–99)
Glucose-Capillary: 81 mg/dL (ref 70–99)
Glucose-Capillary: 83 mg/dL (ref 70–99)
Glucose-Capillary: 84 mg/dL (ref 70–99)
Glucose-Capillary: 95 mg/dL (ref 70–99)

## 2023-02-02 LAB — CULTURE, BLOOD (ROUTINE X 2)
Culture: NO GROWTH
Culture: NO GROWTH
Special Requests: ADEQUATE

## 2023-02-02 LAB — MAGNESIUM: Magnesium: 1.5 mg/dL — ABNORMAL LOW (ref 1.7–2.4)

## 2023-02-02 MED ORDER — ENSURE ENLIVE PO LIQD
237.0000 mL | Freq: Three times a day (TID) | ORAL | Status: DC
  Administered 2023-02-02 – 2023-02-04 (×7): 237 mL

## 2023-02-02 MED ORDER — ENSURE ENLIVE PO LIQD: 237.0000 mL | Freq: Three times a day (TID) | ORAL | Status: DC

## 2023-02-02 MED ORDER — MAGNESIUM SULFATE 2 GM/50ML IV SOLN
2.0000 g | Freq: Once | INTRAVENOUS | Status: AC
  Administered 2023-02-02: 2 g via INTRAVENOUS
  Filled 2023-02-02: qty 50

## 2023-02-02 NOTE — Progress Notes (Signed)
Physical Therapy Treatment Patient Details Name: Christine [name] MRN: [id number] DOB: 11-Aug-1950 Today's Date: 02/02/2023   History of Present Illness Christine [name] is a 72 y/o female admitted with AMS, severely dehydrated with hypoglycemia and hypernatremic.  She also had acute kidney injury and distended gallbladder on CT scan.  PEG placement and cholecystectomy 10/1. PMH: hypertension, hyperlipidemia, OSA on CPAP, GERD, anxiety disorder, anemia, morbid obesity, Pt status post robotic Roux-en-Y gastric bypass on 09/14/2022.  She has been progressively declining since surgery.  She has had multiple hospitalizations and ED visits in past several months.  Recently seen at [facility] on 01/03/23 with complaints of weakness and poor oral intake.  She was hospitalized in July and August of this year at different facilities for dehydration, weakness, acute renal failure and failure to thrive.  She has been residing in a SNF.    PT Comments  Pt admitted with above diagnosis. Pt was able to stand to [facility] with min asssist of 2 persons x 2 with use of pad. Pt tolerates standing up to 25 seconds. Pt fatigues quickly. Moved to chair in [place]. Worked on exercises as well. 0/4 goals met and goals revised today.   Pt currently with functional limitations due to the deficits listed below (see PT Problem List). Pt will benefit from acute skilled PT to increase their independence and safety with mobility to allow discharge.       If plan is discharge home, recommend the following: A lot of help with bathing/dressing/bathroom;Help with stairs or ramp for entrance;Assistance with cooking/housework;Two people to help with walking and/or transfers   Can travel by private vehicle     No  Equipment Recommendations  Other (comment) (Per accepting facility)    Recommendations for Other Services       Precautions / Restrictions Precautions Precautions: Fall Restrictions Weight Bearing Restrictions: No     Mobility  Bed  Mobility Overal bed mobility: Needs Assistance Bed Mobility: Supine to Sit Rolling: Used rails, Mod assist   Supine to sit: Min assist, HOB elevated, Used rails     General bed mobility comments: min A to assist to EOB using rails, mod A for rolling L/R using rails.    Transfers Overall transfer level: Needs assistance Equipment used: Ambulation equipment used Antony Salmon) Transfers: Sit to/from Stand, Bed to chair/wheelchair/BSC Sit to Stand: Min assist, +2 safety/equipment, Via lift equipment           General transfer comment: min A x2 to stand with Stedy, able to stand as needed, good LB strength to complete task.Moved pt to recliner in [place] Transfer via Financial trader: Stedy  Ambulation/Gait                   Stairs             Wheelchair Mobility     Tilt Bed    Modified Rankin (Stroke Patients Only)       Balance Overall balance assessment: Needs assistance Sitting-balance support: Feet supported, No upper extremity supported Sitting balance-Leahy Scale: Fair Sitting balance - Comments: sitting EOB   Standing balance support: Bilateral upper extremity supported, Reliant on assistive device for balance Standing balance-Leahy Scale: Poor Standing balance comment: uses Stedy for support, felt dizzy but able to maintain balance using Stedy                            Cognition Arousal: Alert Behavior During Therapy: [facility] for tasks assessed/performed Overall  Cognitive Status: No family/caregiver present to determine baseline cognitive functioning                                 General Comments: Pt actively participated, able to fully follow commands and participate        Exercises General Exercises - Lower Extremity Ankle Circles/Pumps: Supine, 10 reps, Strengthening, AROM, Both Quad Sets: AROM, Both, 10 reps, Supine Heel Slides: AAROM, Both, 10 reps, Supine    General Comments General comments (skin integrity,  edema, etc.): VSS      Pertinent Vitals/Pain Pain Assessment Pain Assessment: Faces Faces Pain Scale: Hurts whole lot Breathing: normal Negative Vocalization: occasional moan/groan, low speech, negative/disapproving quality Facial Expression: smiling or inexpressive Body Language: relaxed Consolability: no need to console PAINAD Score: 1 Pain Location: bed sores during cleaning/hygiene Pain Descriptors / Indicators: Grimacing, Discomfort Pain Intervention(s): Limited activity within patient's tolerance, Monitored during session, Repositioned    Home Living                          Prior Function            PT Goals (current goals can now be found in the care plan section) Acute Rehab PT Goals Patient Stated Goal: return home with family to assist PT Goal Formulation: With patient/family Time For Goal Achievement: 02/16/23 Potential to Achieve Goals: Good Progress towards PT goals: Progressing toward goals    Frequency    Min 1X/week      PT Plan      Co-evaluation PT/OT/SLP Co-Evaluation/Treatment: Yes Reason for Co-Treatment: Complexity of the patient's impairments (multi-system involvement) PT goals addressed during session: Mobility/safety with mobility OT goals addressed during session: Proper use of Adaptive equipment and DME;ADL's and self-care      AM-PAC PT "6 Clicks" Mobility   Outcome Measure  Help needed turning from your back to your side while in a flat bed without using bedrails?: A Lot Help needed moving from lying on your back to sitting on the side of a flat bed without using bedrails?: A Lot Help needed moving to and from a bed to a chair (including a wheelchair)?: Total Help needed standing up from a chair using your arms (e.g., wheelchair or bedside chair)?: A Lot Help needed to walk in hospital room?: Total Help needed climbing 3-5 steps with a railing? : Total 6 Click Score: 9    End of Session Equipment Utilized During  Treatment: Gait belt Activity Tolerance: Patient limited by fatigue Patient left: in chair;with call bell/phone within reach;with chair alarm set Nurse Communication: Mobility status;Need for lift equipment PT Visit Diagnosis: Unsteadiness on feet (R26.81);Other abnormalities of gait and mobility (R26.89);Muscle weakness (generalized) (M62.81)     Time: 1125-1204 PT Time Calculation (min) (ACUTE ONLY): 39 min  Charges:    $Therapeutic Activity: 8-22 mins PT General Charges $$ ACUTE PT VISIT: 1 Visit                     Prosper Paff M,PT Acute Rehab Services [phone]    Bevelyn Buckles 02/02/2023, 1:38 PM

## 2023-02-02 NOTE — Plan of Care (Signed)
Problem: Education: Goal: Ability to state signs and symptoms to report to health care provider will improve Outcome: Progressing Goal: Knowledge of the prescribed self-care regimen will improve Outcome: Progressing Goal: Knowledge of discharge needs will improve Outcome: Progressing   Problem: Activity: Goal: Ability to tolerate increased activity will improve Outcome: Progressing   Problem: Bowel/Gastric: Goal: Gastrointestinal status for postoperative course will improve Outcome: Progressing Goal: Occurrences of nausea will decrease Outcome: Progressing   Problem: Coping: Goal: Development of coping mechanisms to deal with changes in body function or appearance will improve Outcome: Progressing   Problem: Fluid Volume: Goal: Maintenance of adequate hydration will improve Outcome: Progressing   Problem: Nutritional: Goal: Nutritional status will improve Outcome: Progressing   Problem: Clinical Measurements: Goal: Will show no signs or symptoms of venous thromboembolism Outcome: Progressing Goal: Will remain free from infection Outcome: Progressing Goal: Will show no signs of GI Leak Outcome: Progressing   Problem: Respiratory: Goal: Will regain and/or maintain adequate ventilation Outcome: Progressing   Problem: Pain Management: Goal: Pain level will decrease Outcome: Progressing   Problem: Skin Integrity: Goal: Demonstration of wound healing without infection will improve Outcome: Progressing   Problem: Education: Goal: Knowledge of General Education information will improve Description Including pain rating scale, medication(s)/side effects and non-pharmacologic comfort measures Outcome: Progressing   Problem: Health Behavior/Discharge Planning: Goal: Ability to manage health-related needs will improve Outcome: Progressing   Problem: Clinical Measurements: Goal: Ability to maintain clinical measurements within normal limits will improve Outcome:  Progressing Goal: Will remain free from infection Outcome: Progressing Goal: Diagnostic test results will improve Outcome: Progressing Goal: Respiratory complications will improve Outcome: Progressing Goal: Cardiovascular complication will be avoided Outcome: Progressing   Problem: Activity: Goal: Risk for activity intolerance will decrease Outcome: Progressing   Problem: Nutrition: Goal: Adequate nutrition will be maintained Outcome: Progressing   Problem: Coping: Goal: Level of anxiety will decrease Outcome: Progressing   Problem: Elimination: Goal: Will not experience complications related to bowel motility Outcome: Progressing Goal: Will not experience complications related to urinary retention Outcome: Progressing   Problem: Pain Managment: Goal: General experience of comfort will improve Outcome: Progressing   Problem: Safety: Goal: Ability to remain free from injury will improve Outcome: Progressing   Problem: Skin Integrity: Goal: Risk for impaired skin integrity will decrease Outcome: Progressing

## 2023-02-02 NOTE — Progress Notes (Signed)
Occupational Therapy Treatment Patient Details Name: Ieisha Mallery MRN: [id number] DOB: [birth year] Today's Date: 02/02/2023   History of present illness Rylah Hemple is a 72 y/o female with hypertension, hyperlipidemia, OSA on CPAP, GERD, anxiety disorder, anemia, morbid obesity 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 Dossie Der.  She has been progressively declining since surgery.  She has had multiple hospitalizations and ED visits in past several months.  Recently seen at [facility] on 01/03/23 with complaints of weakness and poor oral intake.  She was hospitalized in July and August of this year at different facilities for dehydration, weakness, acute renal failure and failure to thrive.  She has been residing in a SNF.  She apparently has not been eating or drinking and refusing to take her medications.  She was sent to ER today for evaluation of altered mentation.  She has had 3 days of progressive weakness and refusing oral intake.  She complains of pain all over body which is not new per record review. Pt is a poor historian.   She was found to be severely dehydrated with hypoglycemia and hypernatremic with a sodium of 157.  She also had acute kidney injury and distended gallbladder on CT scan.  CT head with no acute findings.  Pt was started on IV hydration and admission was requested.   OT comments  Pt in good spirits today, able to fully participate in therapy. Co-treat with PT and mobility. Pt c/o dizziness when sitting/standing and significant pain with bed sores during toileting hygiene at bed level, able to roll L/R with min A using bed rails, toileting max A for hygiene and positioning, increased time for managing wound pads and cleaning affected area. Pt assisted to EOB with min A, min A x2 for transfer with Stedy. Good overall BUE/BLE strength to participate in all activities. Pt assisted to chair, able to reposition with min-mod A. Pt would benefit from continued  skilled therapy to improve participation and functional independence, DC plan back to postacute rehab facility still appropriate.       If plan is discharge home, recommend the following:  A lot of help with walking and/or transfers;A lot of help with bathing/dressing/bathroom;Assistance with cooking/housework;Assist for transportation;Help with stairs or ramp for entrance   Equipment Recommendations  Other (comment) (defer)    Recommendations for Other Services      Precautions / Restrictions Precautions Precautions: Fall Restrictions Weight Bearing Restrictions: No       Mobility Bed Mobility Overal bed mobility: Needs Assistance Bed Mobility: Supine to Sit Rolling: Min assist, Used rails   Supine to sit: Min assist, HOB elevated, Used rails     General bed mobility comments: min A to assist to EOB using rails, min A for rolling L/R using rails.    Transfers Overall transfer level: Needs assistance Equipment used: Ambulation equipment used Antony Salmon) Transfers: Sit to/from Stand, Bed to chair/wheelchair/BSC Sit to Stand: Min assist, +2 safety/equipment, Via lift equipment           General transfer comment: min A x2 to stand with Stedy, able to stand as needed, good LB strength to complete task. Transfer via Lift Equipment: Stedy   Balance Overall balance assessment: Needs assistance Sitting-balance support: Feet supported, No upper extremity supported Sitting balance-Leahy Scale: Fair Sitting balance - Comments: sitting EOB   Standing balance support: Bilateral upper extremity supported, Reliant on assistive device for balance Standing balance-Leahy Scale: Poor Standing balance comment: uses Stedy for support, felt dizzy but able  to maintain balance using Stedy                           ADL either performed or assessed with clinical judgement   ADL Overall ADL's : Needs assistance/impaired Eating/Feeding: Independent           Lower Body  Bathing: Maximal assistance;Bed level           Toilet Transfer: Minimal assistance;+2 for safety/equipment Antony Salmon)   Toileting- Clothing Manipulation and Hygiene: Maximal assistance;Bed level       Functional mobility during ADLs: Total assistance (Stedy) General ADL Comments: Pt max A for toileting at bed level, able to roll L/R with mod A, min A to hold legs during hygiene. Pt able to transfer with Stedy min Ax2 to stand    Extremity/Trunk Assessment Upper Extremity Assessment Upper Extremity Assessment: Overall WFL for tasks assessed            Vision       Perception     Praxis      Cognition Arousal: Alert Behavior During Therapy: WFL for tasks assessed/performed Overall Cognitive Status: No family/caregiver present to determine baseline cognitive functioning                                 General Comments: Pt actively participated, able to fully follow commands and participate        Exercises      Shoulder Instructions       General Comments      Pertinent Vitals/ Pain       Pain Assessment Pain Assessment: Faces Faces Pain Scale: Hurts whole lot Pain Location: bed sores during cleaning/hygiene Pain Descriptors / Indicators: Grimacing, Discomfort Pain Intervention(s): Monitored during session  Home Living                                          Prior Functioning/Environment              Frequency  Min 1X/week        Progress Toward Goals  OT Goals(current goals can now be found in the care plan section)  Progress towards OT goals: Progressing toward goals  Acute Rehab OT Goals Patient Stated Goal: to manage pain and bed sores OT Goal Formulation: With patient Time For Goal Achievement: 02/03/23 Potential to Achieve Goals: Good ADL Goals Pt Will Perform Upper Body Dressing: with set-up;sitting Pt Will Perform Lower Body Dressing: with min assist;with adaptive equipment;sitting/lateral  leans Pt Will Transfer to Toilet: with min assist;bedside commode;stand pivot transfer Pt Will Perform Toileting - Clothing Manipulation and hygiene: with min assist;sitting/lateral leans;with adaptive equipment  Plan      Co-evaluation    PT/OT/SLP Co-Evaluation/Treatment: Yes Reason for Co-Treatment: Complexity of the patient's impairments (multi-system involvement)   OT goals addressed during session: Proper use of Adaptive equipment and DME;ADL's and self-care      AM-PAC OT "6 Clicks" Daily Activity     Outcome Measure   Help from another person eating meals?: None Help from another person taking care of personal grooming?: A Little Help from another person toileting, which includes using toliet, bedpan, or urinal?: A Lot Help from another person bathing (including washing, rinsing, drying)?: A Lot Help from another person to put on and taking off  regular upper body clothing?: A Lot Help from another person to put on and taking off regular lower body clothing?: Total 6 Click Score: 14    End of Session Equipment Utilized During Treatment: Other (comment) Antony Salmon)  OT Visit Diagnosis: Unsteadiness on feet (R26.81);Other abnormalities of gait and mobility (R26.89);Muscle weakness (generalized) (M62.81);Pain;Other symptoms and signs involving cognitive function   Activity Tolerance Patient tolerated treatment well   Patient Left in chair;with call bell/phone within reach;with chair alarm set   Nurse Communication Mobility status;Need for lift equipment;Other (comment) (to replace purewick)        Time: 1125-1204 OT Time Calculation (min): 39 min  Charges: OT General Charges $OT Visit: 1 Visit OT Treatments $Self Care/Home Management : 23-37 mins  Noel, OTR/L   Alexis Goodell 02/02/2023, 12:35 PM

## 2023-02-02 NOTE — TOC Progression Note (Signed)
Transition of Care [facility]) - Progression Note    Patient Details  Name: Christine [name] MRN: [id number] Date of Birth: 1951/04/26  Transition of Care [facility]) CM/SW Contact  Carley Hammed, LCSW Phone Number: 02/02/2023, 10:33 AM  Clinical Narrative:    TOC continues to follow for DC needs. Authorization good through tomorrow, then will need to be re started. Facility aware of current barriers. Pt discussed with medical team. TOC is available for further needs.    Expected Discharge Plan: Skilled Nursing Facility Barriers to Discharge: Continued Medical Work up  Expected Discharge Plan and Services In-house Referral: Clinical Social Work Discharge Planning Services: CM Consult Post Acute Care Choice: Skilled Nursing Facility Living arrangements for the past 2 months: Skilled Nursing Facility                                       Social Determinants of Health (SDOH) Interventions SDOH Screenings   Food Insecurity: No Food Insecurity (01/18/2023)  Housing: Patient Declined (01/18/2023)  Transportation Needs: No Transportation Needs (01/18/2023)  Utilities: Not At Risk (01/18/2023)  Alcohol Screen: Low Risk  (07/13/2022)  Depression (PHQ2-9): Low Risk  (01/21/2022)  Financial Resource Strain: Low Risk  (12/01/2022)   Received from [facility]  Physical Activity: Insufficiently Active (07/13/2022)  Social Connections: Moderately Integrated (12/01/2022)   Received [facility]  Stress: No Stress Concern Present (07/13/2022)  Tobacco Use: Medium Risk (01/25/2023)  Health Literacy: Medium Risk (12/01/2022)   Received from [facility]    Readmission Risk Interventions    01/09/2023   11:11 AM  Readmission Risk Prevention Plan  Transportation Screening Complete  HRI or Home Care Consult Complete  Social Work Consult for Recovery Care Planning/Counseling Complete  Palliative Care Screening Not Applicable  Medication Review Oceanographer) Complete

## 2023-02-02 NOTE — Progress Notes (Signed)
Updated Chinita Greenland about hemoglobin of 7 and Magnesium of 1.5.

## 2023-02-02 NOTE — Progress Notes (Addendum)
   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  Has had multiple hospitalizations and ED visits in the past several months.  Postoperative course complicated by very poor oral intake, weakness, currently residing in an SNF.  She has refused to eat, drink, and take her medications.  She was admitted to [facility] with confusion.  GI was consulted and did not find an organic cause for her not eating.  After discussing with the family, a feeding tube was recommended and she was transferred to [facility] for surgery consultation. Palliative care has been asked to continue involvement to support patient and further address goals of care as it relates to possible PEG placement,   Today's Discussion 02/02/2023  *Please note that this is a verbal dictation therefore any spelling or grammatical errors are due to the "Dragon Medical One" system interpretation.  Chart reviewed inclusive of vital signs, progress notes, laboratory results, and diagnostic images.   I met with Christine [name] at bedside this morning, she is more disoriented this morning. She shares that "upstairs she lost her wallet". I clarified what she meant by upstairs and she refers to when she was in a separate part of the hospital. I was able to look through her things and find what was needed.   Christine [name] shares that her abdominal pain has resolved. She denies nausea or shortness of breath.  Per nursing Christine [name] remains at time disoriented. She has not have any distress.  The biggest issue remains to be Christine [name]'s tenuous BS level which the primary team hopes to improve through starting ensure.  Questions addressed and Palliative support provided.  Objective Assessment: Vital Signs Vitals:   02/02/23 0537 02/02/23 0734  BP: (!) 141/74 (!) 144/78   Pulse: (!) 102 86  Resp: 20 16  Temp: 98.3 F (36.8 C) 98.5 F (36.9 C)  SpO2: 100% 99%    Intake/Output Summary (Last 24 hours) at 02/02/2023 1205 Last data filed at 02/02/2023 1610 Gross per 24 hour  Intake 620.07 ml  Output 282 ml  Net 338.07 ml   Last Weight  Most recent update: 02/02/2023  6:14 AM    Weight  107 kg (235 lb 14.3 oz)            Gen:  Elderly AA F in NAD HEENT: Dry mucous membranes CV: Regular rate and rhythm  PULM: On RA, breathing is even and nonlabored ABD: (+) PEG EXT: (+) edema  Neuro: Alert and oriented x3   SUMMARY OF RECOMMENDATIONS   DNAR/DNI  MOST/DNR Complete  Allowing time for outcomes  Ongoing Chaplain support  Plan for placement at [facility] once medically optimized  Incremental PMT support   Time: 25 ______________________________________________________________________________________ [name] Palliative Medicine Team Team Cell Phone: [phone] Please utilize secure chat with additional questions, if there is no response within 30 minutes please call the above phone number  Palliative Medicine Team providers are available by phone from 7am to 7pm daily and can be reached through the team cell phone.  Should this patient require assistance outside of these hours, please call the patient's attending physician.

## 2023-02-02 NOTE — Plan of Care (Signed)
  Problem: Nutritional: Goal: Nutritional status will improve Outcome: Not Progressing   Problem: Pain Management: Goal: Pain level will decrease Outcome: Not Progressing   Problem: Skin Integrity: Goal: Demonstration of wound healing without infection will improve Outcome: Not Progressing   Problem: Elimination: Goal: Will not experience complications related to urinary retention Outcome: Not Progressing

## 2023-02-02 NOTE — Progress Notes (Signed)
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 Has had multiple hospitalizations and ED visits in the past several months. Postoperative course complicated by very poor oral intake, weakness, currently residing in an SNF. She has refused to eat, drink, and take her medications. She was admitted to [facility] with confusion. GI was consulted and did not find an organic cause for her not eating. After discussing with the family, a feeding tube was recommended and she was transferred to [facility] for surgery consultation.  She is status post PEG tube and laparoscopic cholecystectomy on 10/1.  Started on tube feeds.  Subjective / 24h Interval events: Doing well this morning, she has no complaints.  Assesement and Plan: Principal Problem:   Failure to thrive in adult Active Problems:   Acute metabolic encephalopathy   Essential hypertension   Gastro-esophageal reflux disease with esophagitis   Osteoarthrosis   OSA on CPAP   GAD (generalized anxiety disorder)   Mixed incontinence urge and stress   Morbid obesity (HCC)   Abdominal pain   Gout   Gastroesophageal reflux disease   Prediabetes   Memory difficulties   Hyperlipidemia   Hypernatremia   Severe dehydration   Altered mental status   AKI (acute kidney injury) (HCC)   Gallbladder dilatation   Esophageal dysphagia   Abnormal esophagram   Hypoglycemia   Pressure injury of skin   Principal problem Principal problem Hypernatremia / Severe Dehydration - pt presented with severe clinical findings of free water deficit.  Has received IV fluids and sodium is now normalized.   -Status post laparoscopic gastrostomy on 10/1, also  underwent cholecystectomy -Continue tube feeds   Active problems Hypoglycemia/Failure to Thrive - due poor oral intake and recent gastric bypass.  Continue to monitor CBGs. Am cortisol 25.7. TSH 1.715. [id number]. Folate 8.6 -Hypoglycemia persists following PEG tube placement.  Discontinue losartan placed on Ensure, scheduled, per tube -Discontinue IV fluids today.  Monitor CBGs  Adult Failure to Thrive/Esophageal dysmotility, refusing to eat - Patient continues to have poor oral intake and no desire to eat.  9/20 Esophagram--advanced esophageal dysmotility but no obstruction.  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  GI consulted, they do not appreciate this being an issue for which she could not eat.  After discussions with family, she was transferred to [facility] for surgical evaluation for gastrostomy tube.   -PEG tube in place now, continue tube feeds   UTI--ESBL E coli -9/20 UA>50 WBC. 9/20 urine culture = MDR Ecoli.  Has been placed on meropenem, status post 7 days completed 9/29.  Monitor off antibiotics for now   Abdominal pain -9/14 CT abd--limited by motion, but  No obvious obstruction, free air or free fluid; s/p gastric bypass. 9/15 Abd US--GB distended, minimal GB sludge.  LFTs were normal.  She also underwent a HIDA scan which showed patent cystic and common bile ducts   NAGMA -improved after bicarb drip   Hypokalemia/Hypomagnesemia/Hypophosphatemia -continue to monitor, replace as indicated.  Replenish magnesium today   AKI -baseline creatinine 0.6-1.0, -serum creatinine peaked 1.66, creatinine at baseline this morning   Acute metabolic encephalopathy/Failure to Thrive - secondary to  severe hypernatremia, AKI, mental status now back to baseline   OSA  - nightly CPAP ordered    Essential hypertension  -continue to monitor blood pressure   Morbid Obesity - pt is postop  s/p 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 Dossie Der.  BMI 43   GERD - continue PPI   Sacral pressure wounds - noted below, appreciate wound care nursing team consultation   Goals of care -now DNR/DNI, has a PEG tube.  Appreciate palliative involvement  Scheduled Meds:  antiseptic oral rinse  15 mL Mouth Rinse BID   Chlorhexidine Gluconate Cloth  6 each Topical Daily   cholecalciferol  5,000 Units Per Tube Daily   colchicine  0.6 mg Per Tube Daily   docusate  100 mg Per Tube BID   DULoxetine  60 mg Oral Daily   enoxaparin (LOVENOX) injection  40 mg Subcutaneous Q24H   feeding supplement  237 mL Per Tube TID BM   feeding supplement (JEVITY 1.5 CAL/FIBER)  1,000 mL Per Tube Q24H   feeding supplement (PROSource TF20)  60 mL Per Tube Daily   ferrous sulfate  325 mg Oral Q breakfast   folic acid  1 mg Per Tube Daily   free water  100 mL Per Tube Q8H   Gerhardt's butt cream   Topical BID   mirtazapine  7.5 mg Per Tube QHS   multivitamin with minerals  1 tablet Per Tube BID   mouth rinse  15 mL Mouth Rinse TID   oxybutynin  7.5 mg Per Tube BID   pantoprazole (PROTONIX) IV  40 mg Intravenous Q12H   sodium bicarbonate  1,300 mg Per Tube TID   sodium chloride flush  10-40 mL Intracatheter Q12H   thiamine  100 mg Per Tube Daily   Continuous Infusions: PRN Meds:.acetaminophen, diclofenac Sodium, metoprolol tartrate, ondansetron **OR** ondansetron (ZOFRAN) IV, oxyCODONE, simethicone, sodium chloride flush  Current Outpatient Medications  Medication Instructions   acetaminophen (TYLENOL) 650 mg, Oral, Every 4 hours PRN   amLODipine (NORVASC) 10 mg, Oral, Daily   antiseptic oral rinse (BIOTENE) LIQD 15 mLs, Mouth Rinse, 2 times daily   ascorbic acid (VITAMIN C) 500 mg, Oral, Daily   colchicine 1.2 mg, Oral, Daily   diclofenac Sodium (VOLTAREN) 2 g, Topical, 4 times daily   DULoxetine (CYMBALTA) 60 mg, Oral, Daily   ferrous sulfate 325 mg, Oral, Daily with breakfast    folic acid (FOLVITE) 1 mg, Oral, Daily   gabapentin (NEURONTIN) 100 mg, Oral, 3 times daily   Multiple Vitamin (MULTIVITAMIN WITH MINERALS) TABS tablet 1 tablet, Oral, Daily   Nutritional Supplements (PROMOD) LIQD 30 mLs, Oral, 2 times daily   omeprazole (PRILOSEC) 20 mg, Oral, Daily   oxybutynin (DITROPAN XL) 15 mg, Oral, Daily at bedtime   promethazine (PHENERGAN) 25 mg, Oral, Every 6 hours PRN   senna (SENOKOT) 8.6 MG TABS tablet 1 tablet, Oral, Daily at bedtime   sodium chloride irrigation 0.9 % irrigation 75 mLs, Irrigation, See admin instructions, Use 75 mL/hr intravenously every shift for fluid resuscitation for 2 days   traMADol (ULTRAM) 50 mg, Oral, 3 times daily PRN   triamterene-hydrochlorothiazide (MAXZIDE-25) 37.5-25 MG tablet 1 tablet, Oral, Daily   zinc gluconate 50 mg, Oral, Daily    Diet Orders (From admission, onward)     Start     Ordered   01/26/23 1450  Diet regular Room service appropriate? Yes with Assist; Fluid consistency: Thin  Diet effective now  Question Answer Comment  Room service appropriate? Yes with Assist   Fluid consistency: Thin      01/26/23 1451            DVT prophylaxis: enoxaparin (LOVENOX) injection 40 mg Start: 01/08/23 2200   Lab Results  Component Value Date   PLT 300 02/02/2023      Code Status: Limited: Do not attempt resuscitation (DNR) -DNR-LIMITED -Do Not Intubate/DNI   Family Communication: no family at bedside   Status is: Inpatient Remains inpatient appropriate because: hypoglycemia  Level of care: Med-Surg  Consultants:  Surgery Palliative  Objective: Vitals:   02/01/23 2102 02/01/23 2205 02/02/23 0537 02/02/23 0734  BP: 130/84  (!) 141/74 (!) 144/78  Pulse: 97  (!) 102 86  Resp: 18  20 16   Temp: 98.1 F (36.7 C)  98.3 F (36.8 C) 98.5 F (36.9 C)  TempSrc: Oral  Oral Oral  SpO2:  100% 100% 99%  Weight:   107 kg   Height:        Intake/Output Summary (Last 24 hours) at 02/02/2023 1015 Last  data filed at 02/02/2023 0517 Gross per 24 hour  Intake 817.6 ml  Output 282 ml  Net 535.6 ml   Wt Readings from Last 3 Encounters:  02/02/23 107 kg  11/14/22 114.6 kg  10/27/22 119.1 kg    Examination: Constitutional: NAD Eyes: lids and conjunctivae normal, no scleral icterus ENMT: mmm Neck: normal, supple Respiratory: clear to auscultation bilaterally, no wheezing, no crackles. Normal respiratory effort.  Cardiovascular: Regular rate and rhythm, no murmurs / rubs / gallops. No LE edema. Abdomen: soft, no distention, no tenderness. Bowel sounds positive.    Data Reviewed: I have independently reviewed following labs and imaging studies   CBC Recent Labs  Lab 01/28/23 0913 01/29/23 1504 01/30/23 0353 01/31/23 0351 02/02/23 0413  WBC 18.2* 10.2 10.2 10.9* 10.3  HGB 8.8* 7.5* 7.3* 7.2* 7.0*  HCT 27.7* 22.8* 22.2* 21.8* 21.3*  PLT 216 216 205 247 300  MCV 83.9 81.7 82.5 83.8 84.5  MCH 26.7 26.9 27.1 27.7 27.8  MCHC 31.8 32.9 32.9 33.0 32.9  RDW 21.9* 21.3* 21.5* 20.8* 20.2*  LYMPHSABS  --  1.0  --   --   --   MONOABS  --  0.4  --   --   --   EOSABS  --  0.0  --   --   --   BASOSABS  --  0.1  --   --   --     Recent Labs  Lab 01/27/23 0435 01/28/23 0320 01/28/23 0913 01/28/23 2025 01/29/23 0319 01/29/23 1504 01/30/23 0353 01/31/23 0351 02/02/23 0413  NA 129*  --  132*  --   --  134* 133* 133* 132*  K 3.3*  --  4.4  --   --  4.1 3.9 4.0 3.7  CL 105  --  105  --   --  106 108 108 104  CO2 17*  --  14*  --   --  16* 18* 19* 20*  GLUCOSE 118*  --  111*  --   --  118* 90 103* 103*  BUN 13  --  17  --   --  22 21 22 15   CREATININE 1.09*  --  0.98  --  0.88 0.82 0.73 0.69 0.62  CALCIUM 7.9*  --  8.0*  --   --  7.9* 7.7* 7.8* 8.0*  AST 31  --  22  --   --  17  --   --   --   ALT 19  --  18  --   --  14  --   --   --   ALKPHOS 96  --  130*  --   --  146*  --   --   --   BILITOT 0.7  --  0.5  --   --  0.6  --   --   --   ALBUMIN 2.1*  --  1.9*  --   --  1.8* 1.7*  1.6* 1.6*  MG 2.4   < >  --   --  1.9 1.8 1.6* 1.9 1.5*  TSH  --   --   --  1.513  --   --   --   --   --   AMMONIA  --   --   --  <10  --   --   --   --   --    < > = values in this interval not displayed.    ------------------------------------------------------------------------------------------------------------------ No results for input(s): "CHOL", "HDL", "LDLCALC", "TRIG", "CHOLHDL", "LDLDIRECT" in the last 72 hours.  Lab Results  Component Value Date   HGBA1C 6.3 (H) 09/01/2022   ------------------------------------------------------------------------------------------------------------------ No results for input(s): "TSH", "T4TOTAL", "T3FREE", "THYROIDAB" in the last 72 hours.  Invalid input(s): "FREET3"  Cardiac Enzymes No results for input(s): "CKMB", "TROPONINI", "MYOGLOBIN" in the last 168 hours.  Invalid input(s): "CK" ------------------------------------------------------------------------------------------------------------------ No results found for: "BNP"  CBG: Recent Labs  Lab 02/01/23 1625 02/01/23 2105 02/02/23 0017 02/02/23 0539 02/02/23 0759  GLUCAP 98 77 81 79 84    Recent Results (from the past 240 hour(s))  Culture, blood (Routine X 2) w Reflex to ID Panel     Status: None   Collection Time: 01/28/23  1:16 PM   Specimen: BLOOD LEFT HAND  Result Value Ref Range Status   Specimen Description BLOOD LEFT HAND  Final   Special Requests   Final    BOTTLES DRAWN AEROBIC AND ANAEROBIC Blood Culture results may not be optimal due to an inadequate volume of blood received in culture bottles   Culture   Final    NO GROWTH 5 DAYS Performed at [facility], [address] [place], [place] [address]    Report Status 02/02/2023 FINAL  Final  Culture, blood (Routine X 2) w Reflex to ID Panel     Status: None   Collection Time: 01/28/23  7:01 PM   Specimen: BLOOD LEFT ARM  Result Value Ref Range Status   Specimen Description BLOOD LEFT ARM  Final    Special Requests   Final    BOTTLES DRAWN AEROBIC AND ANAEROBIC Blood Culture adequate volume   Culture   Final    NO GROWTH 5 DAYS Performed at [facility], [address] [place], [place] [address]    Report Status 02/02/2023 FINAL  Final     Radiology Studies: No results found.   Pamella Pert, MD, PhD [facility]  Between 7 am - 7 pm I am available, please contact me via Amion (for emergencies) or Securechat (non urgent messages)  Between 7 pm - 7 am I am not available, please contact night coverage MD/APP via Amion

## 2023-02-03 LAB — COMPREHENSIVE METABOLIC PANEL
ALT: 14 U/L (ref 0–44)
AST: 14 U/L — ABNORMAL LOW (ref 15–41)
Albumin: 1.6 g/dL — ABNORMAL LOW (ref 3.5–5.0)
Alkaline Phosphatase: 136 U/L — ABNORMAL HIGH (ref 38–126)
Anion gap: 7 (ref 5–15)
BUN: 13 mg/dL (ref 8–23)
CO2: 23 mmol/L (ref 22–32)
Calcium: 8 mg/dL — ABNORMAL LOW (ref 8.9–10.3)
Chloride: 107 mmol/L (ref 98–111)
Creatinine, Ser: 0.66 mg/dL (ref 0.44–1.00)
GFR, Estimated: >60 mL/min (ref >60)
Glucose, Bld: 96 mg/dL (ref 70–99)
Potassium: 3.7 mmol/L (ref 3.5–5.1)
Sodium: 137 mmol/L (ref 135–145)
Total Bilirubin: 0.6 mg/dL (ref 0.3–1.2)
Total Protein: 5 g/dL — ABNORMAL LOW (ref 6.5–8.1)

## 2023-02-03 LAB — GLUCOSE, CAPILLARY
Glucose-Capillary: 100 mg/dL — ABNORMAL HIGH (ref 70–99)
Glucose-Capillary: 137 mg/dL — ABNORMAL HIGH (ref 70–99)
Glucose-Capillary: 68 mg/dL — ABNORMAL LOW (ref 70–99)
Glucose-Capillary: 87 mg/dL (ref 70–99)
Glucose-Capillary: 90 mg/dL (ref 70–99)
Glucose-Capillary: 94 mg/dL (ref 70–99)
Glucose-Capillary: 96 mg/dL (ref 70–99)

## 2023-02-03 LAB — PREPARE RBC (CROSSMATCH)

## 2023-02-03 LAB — HEMOGLOBIN AND HEMATOCRIT, BLOOD
HCT: 20.7 % — ABNORMAL LOW (ref 36.0–46.0)
Hemoglobin: 6.7 g/dL — CL (ref 12.0–15.0)

## 2023-02-03 MED ORDER — DEXTROSE 50 % IV SOLN
25.0000 mL | Freq: Once | INTRAVENOUS | Status: AC
  Administered 2023-02-03: 25 mL via INTRAVENOUS

## 2023-02-03 MED ORDER — DEXTROSE 50 % IV SOLN
INTRAVENOUS | Status: AC
  Administered 2023-02-03: 50 mL
  Filled 2023-02-03: qty 50

## 2023-02-03 MED ORDER — SODIUM CHLORIDE 0.9% IV SOLUTION: Freq: Once | INTRAVENOUS | Status: AC

## 2023-02-03 NOTE — Care Management Important Message (Signed)
Important Message  Patient Details  Name: Christine [name] MRN: [id number] Date of Birth: April 09, 1951   Important Message Given:  Yes - Medicare IM     Sherilyn Banker 02/03/2023, 4:10 PM

## 2023-02-03 NOTE — Progress Notes (Signed)
   02/03/23 1139  Assess: MEWS Score  Temp 99.1 F (37.3 C)  BP (!) 131/105  Pulse Rate (!) 118  Resp 18  SpO2 100 %  O2 Device Room Air  Assess: MEWS Score  MEWS Temp 0  MEWS Systolic 0  MEWS Pulse 2  MEWS RR 0  MEWS LOC 0  MEWS Score 2  MEWS Score Color Yellow  Assess: if the MEWS score is Yellow or Red  Were vital signs accurate and taken at a resting state? Yes  Does the patient meet 2 or more of the SIRS criteria? No  Does the patient have a confirmed or suspected source of infection? No  MEWS guidelines implemented  Yes, yellow  Treat  MEWS Interventions Considered administering scheduled or prn medications/treatments as ordered  Take Vital Signs  Increase Vital Sign Frequency  Yellow: Q2hr x1, continue Q4hrs until patient remains green for 12hrs  Escalate  MEWS: Escalate Yellow: Discuss with charge nurse and consider notifying provider and/or RRT  Notify: Charge Nurse/RN  Name of Charge Nurse/RN Notified Kiersten, RN  Provider Notification  Provider Name/Title Gherghe  Date Provider Notified 02/03/23  Time Provider Notified 1143  Method of Notification Page (secure chat)  Notification Reason Other (Comment)  Assess: SIRS CRITERIA  SIRS Temperature  0  SIRS Pulse 1  SIRS Respirations  0  SIRS WBC 0  SIRS Score Sum  1

## 2023-02-03 NOTE — Plan of Care (Signed)
  Problem: Activity: Goal: Ability to tolerate increased activity will improve Outcome: Not Progressing   Problem: Bowel/Gastric: Goal: Gastrointestinal status for postoperative course will improve Outcome: Not Progressing   Problem: Skin Integrity: Goal: Demonstration of wound healing without infection will improve Outcome: Not Progressing   Problem: Skin Integrity: Goal: Risk for impaired skin integrity will decrease Outcome: Not Progressing

## 2023-02-03 NOTE — Plan of Care (Signed)
Problem: Education: Goal: Ability to state signs and symptoms to report to health care provider will improve Outcome: Progressing Goal: Knowledge of the prescribed self-care regimen will improve Outcome: Progressing Goal: Knowledge of discharge needs will improve Outcome: Progressing   Problem: Activity: Goal: Ability to tolerate increased activity will improve Outcome: Progressing   Problem: Bowel/Gastric: Goal: Gastrointestinal status for postoperative course will improve Outcome: Progressing Goal: Occurrences of nausea will decrease Outcome: Progressing   Problem: Coping: Goal: Development of coping mechanisms to deal with changes in body function or appearance will improve Outcome: Progressing   Problem: Fluid Volume: Goal: Maintenance of adequate hydration will improve Outcome: Progressing   Problem: Nutritional: Goal: Nutritional status will improve Outcome: Progressing   Problem: Clinical Measurements: Goal: Will show no signs or symptoms of venous thromboembolism Outcome: Progressing Goal: Will remain free from infection Outcome: Progressing Goal: Will show no signs of GI Leak Outcome: Progressing   Problem: Respiratory: Goal: Will regain and/or maintain adequate ventilation Outcome: Progressing   Problem: Pain Management: Goal: Pain level will decrease Outcome: Progressing   Problem: Skin Integrity: Goal: Demonstration of wound healing without infection will improve Outcome: Progressing   Problem: Education: Goal: Knowledge of General Education information will improve Description Including pain rating scale, medication(s)/side effects and non-pharmacologic comfort measures Outcome: Progressing   Problem: Health Behavior/Discharge Planning: Goal: Ability to manage health-related needs will improve Outcome: Progressing   Problem: Clinical Measurements: Goal: Ability to maintain clinical measurements within normal limits will improve Outcome:  Progressing Goal: Will remain free from infection Outcome: Progressing Goal: Diagnostic test results will improve Outcome: Progressing Goal: Respiratory complications will improve Outcome: Progressing Goal: Cardiovascular complication will be avoided Outcome: Progressing   Problem: Activity: Goal: Risk for activity intolerance will decrease Outcome: Progressing   Problem: Nutrition: Goal: Adequate nutrition will be maintained Outcome: Progressing   Problem: Coping: Goal: Level of anxiety will decrease Outcome: Progressing   Problem: Elimination: Goal: Will not experience complications related to bowel motility Outcome: Progressing Goal: Will not experience complications related to urinary retention Outcome: Progressing   Problem: Pain Managment: Goal: General experience of comfort will improve Outcome: Progressing   Problem: Safety: Goal: Ability to remain free from injury will improve Outcome: Progressing   Problem: Skin Integrity: Goal: Risk for impaired skin integrity will decrease Outcome: Progressing

## 2023-02-03 NOTE — Progress Notes (Signed)
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 Has had multiple hospitalizations and ED visits in the past several months. Postoperative course complicated by very poor oral intake, weakness, currently residing in an SNF. She has refused to eat, drink, and take her medications. She was admitted to [facility] with confusion. GI was consulted and did not find an organic cause for her not eating. After discussing with the family, a feeding tube was recommended and she was transferred to [facility] for surgery consultation.  She is status post PEG tube and laparoscopic cholecystectomy on 10/1.  Started on tube feeds.  Subjective / 24h Interval events: No complaints, looking for her phone  Assesement and Plan: Principal Problem:   Failure to thrive in adult Active Problems:   Acute metabolic encephalopathy   Essential hypertension   Gastro-esophageal reflux disease with esophagitis   Osteoarthrosis   OSA on CPAP   GAD (generalized anxiety disorder)   Mixed incontinence urge and stress   Morbid obesity (HCC)   Abdominal pain   Gout   Gastroesophageal reflux disease   Prediabetes   Memory difficulties   Hyperlipidemia   Hypernatremia   Severe dehydration   Altered mental status   AKI (acute kidney injury) (HCC)   Gallbladder dilatation   Esophageal dysphagia   Abnormal esophagram   Hypoglycemia   Pressure injury of skin   Principal problem Principal problem Hypernatremia / Severe Dehydration - pt presented with severe clinical findings of free water deficit.  Has received IV fluids and sodium is now normalized.   -Status post laparoscopic gastrostomy on 10/1, also underwent  cholecystectomy -Continue tube feeds   Active problems Hypoglycemia/Failure to Thrive - due poor oral intake and recent gastric bypass.  Continue to monitor CBGs. Am cortisol 25.7. TSH 1.715. W11--9147. Folate 8.6 -Hypoglycemia persists following PEG tube placement.  Discontinue losartan placed on Ensure, scheduled, per tube -Off IV fluids, CBGs trend overall better  Adult Failure to Thrive/Esophageal dysmotility, refusing to eat - Patient continues to have poor oral intake and no desire to eat.  9/20 Esophagram--advanced esophageal dysmotility but no obstruction.  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  GI consulted, they do not appreciate this being an issue for which she could not eat.  After discussions with family, she was transferred to [facility] for surgical evaluation for gastrostomy tube.   -PEG tube in place now, continue tube feeds   UTI--ESBL E coli -9/20 UA>50 WBC. 9/20 urine culture = MDR Ecoli.  Has been placed on meropenem, status post 7 days completed 9/29.  Monitor off antibiotics for now  Anemia of chronic disease -hemoglobin dipped to less than 7 today, no bleeding, this is likely postoperative in the setting of poor nutrition.  Transfused unit of packed red blood cells  Abdominal pain -9/14 CT abd--limited by motion, but  No obvious obstruction, free air or free fluid; s/p gastric bypass. 9/15 Abd US--GB distended, minimal GB sludge.  LFTs were normal.  She also underwent a HIDA scan which showed patent cystic and common bile ducts -No further abdominal pain   NAGMA -improved after bicarb drip   Hypokalemia/Hypomagnesemia/Hypophosphatemia -continue to  monitor and replace as indicated   AKI -baseline creatinine 0.6-1.0, -serum creatinine peaked 1.66, creatinine    Acute metabolic encephalopathy/Failure to Thrive - secondary to severe hypernatremia, AKI, mental  status now back to baseline   OSA  - nightly CPAP ordered    Essential hypertension  -continue to monitor blood pressure   Morbid Obesity - pt is postop s/p 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 Dossie Der.  BMI 43   GERD - continue PPI   Sacral pressure wounds - noted below, appreciate wound care nursing team consultation   Goals of care -now DNR/DNI, has a PEG tube.  Appreciate palliative involvement  Scheduled Meds:  sodium chloride   Intravenous Once   antiseptic oral rinse  15 mL Mouth Rinse BID   Chlorhexidine Gluconate Cloth  6 each Topical Daily   cholecalciferol  5,000 Units Per Tube Daily   colchicine  0.6 mg Per Tube Daily   docusate  100 mg Per Tube BID   DULoxetine  60 mg Oral Daily   enoxaparin (LOVENOX) injection  40 mg Subcutaneous Q24H   feeding supplement  237 mL Per Tube TID BM   feeding supplement (JEVITY 1.5 CAL/FIBER)  1,000 mL Per Tube Q24H   feeding supplement (PROSource TF20)  60 mL Per Tube Daily   ferrous sulfate  325 mg Oral Q breakfast   folic acid  1 mg Per Tube Daily   free water  100 mL Per Tube Q8H   Gerhardt's butt cream   Topical BID   mirtazapine  7.5 mg Per Tube QHS   multivitamin with minerals  1 tablet Per Tube BID   mouth rinse  15 mL Mouth Rinse TID   oxybutynin  7.5 mg Per Tube BID   pantoprazole (PROTONIX) IV  40 mg Intravenous Q12H   sodium bicarbonate  1,300 mg Per Tube TID   sodium chloride flush  10-40 mL Intracatheter Q12H   thiamine  100 mg Per Tube Daily   Continuous Infusions: PRN Meds:.acetaminophen, diclofenac Sodium, metoprolol tartrate, ondansetron **OR** ondansetron (ZOFRAN) IV, oxyCODONE, simethicone, sodium chloride flush  Current Outpatient Medications  Medication Instructions   acetaminophen (TYLENOL) 650 mg, Oral, Every 4 hours PRN   amLODipine (NORVASC) 10 mg, Oral, Daily   antiseptic oral rinse (BIOTENE) LIQD 15 mLs, Mouth Rinse, 2 times daily   ascorbic acid (VITAMIN C) 500 mg, Oral, Daily    colchicine 1.2 mg, Oral, Daily   diclofenac Sodium (VOLTAREN) 2 g, Topical, 4 times daily   DULoxetine (CYMBALTA) 60 mg, Oral, Daily   ferrous sulfate 325 mg, Oral, Daily with breakfast   folic acid (FOLVITE) 1 mg, Oral, Daily   gabapentin (NEURONTIN) 100 mg, Oral, 3 times daily   Multiple Vitamin (MULTIVITAMIN WITH MINERALS) TABS tablet 1 tablet, Oral, Daily   Nutritional Supplements (PROMOD) LIQD 30 mLs, Oral, 2 times daily   omeprazole (PRILOSEC) 20 mg, Oral, Daily   oxybutynin (DITROPAN XL) 15 mg, Oral, Daily at bedtime   promethazine (PHENERGAN) 25 mg, Oral, Every 6 hours PRN   senna (SENOKOT) 8.6 MG TABS tablet 1 tablet, Oral, Daily at bedtime   sodium chloride irrigation 0.9 % irrigation 75 mLs, Irrigation, See admin instructions, Use 75 mL/hr intravenously every shift for fluid resuscitation for 2 days   traMADol (ULTRAM) 50 mg, Oral, 3 times daily PRN   triamterene-hydrochlorothiazide (MAXZIDE-25) 37.5-25 MG tablet 1 tablet, Oral, Daily   zinc gluconate 50 mg, Oral, Daily    Diet Orders (From admission, onward)  Start     Ordered   01/26/23 1450  Diet regular Room service appropriate? Yes with Assist; Fluid consistency: Thin  Diet effective now       Question Answer Comment  Room service appropriate? Yes with Assist   Fluid consistency: Thin      01/26/23 1451            DVT prophylaxis: enoxaparin (LOVENOX) injection 40 mg Start: 01/08/23 2200   Lab Results  Component Value Date   PLT 300 02/02/2023      Code Status: Limited: Do not attempt resuscitation (DNR) -DNR-LIMITED -Do Not Intubate/DNI   Family Communication: no family at bedside   Status is: Inpatient Remains inpatient appropriate because: hypoglycemia  Level of care: Med-Surg  Consultants:  Surgery Palliative  Objective: Vitals:   02/03/23 0459 02/03/23 0758 02/03/23 0831 02/03/23 0845  BP: (!) 150/77 (!) 141/82 137/74   Pulse: (!) 106  (!) 118 (!) 104  Resp: 18 17 18    Temp: 97.9  F (36.6 C) 98.2 F (36.8 C) 97.8 F (36.6 C)   TempSrc: Oral  Oral   SpO2: 94%  100%   Weight:      Height:        Intake/Output Summary (Last 24 hours) at 02/03/2023 1042 Last data filed at 02/03/2023 0550 Gross per 24 hour  Intake --  Output 600 ml  Net -600 ml   Wt Readings from Last 3 Encounters:  02/03/23 103 kg  11/14/22 114.6 kg  10/27/22 119.1 kg    Examination: Constitutional: NAD Eyes: lids and conjunctivae normal, no scleral icterus ENMT: mmm Neck: normal, supple Respiratory: clear to auscultation bilaterally, no wheezing, no crackles. Normal respiratory effort.  Cardiovascular: Regular rate and rhythm, no murmurs / rubs / gallops. No LE edema. Abdomen: soft, no distention, no tenderness. Bowel sounds positive.    Data Reviewed: I have independently reviewed following labs and imaging studies   CBC Recent Labs  Lab 01/28/23 0913 01/29/23 1504 01/30/23 0353 01/31/23 0351 02/02/23 0413 02/03/23 0410  WBC 18.2* 10.2 10.2 10.9* 10.3  --   HGB 8.8* 7.5* 7.3* 7.2* 7.0* 6.7*  HCT 27.7* 22.8* 22.2* 21.8* 21.3* 20.7*  PLT 216 216 205 247 300  --   MCV 83.9 81.7 82.5 83.8 84.5  --   MCH 26.7 26.9 27.1 27.7 27.8  --   MCHC 31.8 32.9 32.9 33.0 32.9  --   RDW 21.9* 21.3* 21.5* 20.8* 20.2*  --   LYMPHSABS  --  1.0  --   --   --   --   MONOABS  --  0.4  --   --   --   --   EOSABS  --  0.0  --   --   --   --   BASOSABS  --  0.1  --   --   --   --     Recent Labs  Lab 01/28/23 0913 01/28/23 2025 01/29/23 0319 01/29/23 1504 01/30/23 0353 01/31/23 0351 02/02/23 0413 02/03/23 0410  NA 132*  --   --  134* 133* 133* 132* 137  K 4.4  --   --  4.1 3.9 4.0 3.7 3.7  CL 105  --   --  106 108 108 104 107  CO2 14*  --   --  16* 18* 19* 20* 23  GLUCOSE 111*  --   --  118* 90 103* 103* 96  BUN 17  --   --  22  21 22 15 13   CREATININE 0.98  --  0.88 0.82 0.73 0.69 0.62 0.66  CALCIUM 8.0*  --   --  7.9* 7.7* 7.8* 8.0* 8.0*  AST 22  --   --  17  --   --   --  14*   ALT 18  --   --  14  --   --   --  14  ALKPHOS 130*  --   --  146*  --   --   --  136*  BILITOT 0.5  --   --  0.6  --   --   --  0.6  ALBUMIN 1.9*  --   --  1.8* 1.7* 1.6* 1.6* 1.6*  MG  --   --  1.9 1.8 1.6* 1.9 1.5*  --   TSH  --  1.513  --   --   --   --   --   --   AMMONIA  --  <10  --   --   --   --   --   --     ------------------------------------------------------------------------------------------------------------------ No results for input(s): "CHOL", "HDL", "LDLCALC", "TRIG", "CHOLHDL", "LDLDIRECT" in the last 72 hours.  Lab Results  Component Value Date   HGBA1C 6.3 (H) 09/01/2022   ------------------------------------------------------------------------------------------------------------------ No results for input(s): "TSH", "T4TOTAL", "T3FREE", "THYROIDAB" in the last 72 hours.  Invalid input(s): "FREET3"  Cardiac Enzymes No results for input(s): "CKMB", "TROPONINI", "MYOGLOBIN" in the last 168 hours.  Invalid input(s): "CK" ------------------------------------------------------------------------------------------------------------------ No results found for: "BNP"  CBG: Recent Labs  Lab 02/02/23 2046 02/03/23 0045 02/03/23 0350 02/03/23 0456 02/03/23 0754  GLUCAP 83 94 68* 100* 87    Recent Results (from the past 240 hour(s))  Culture, blood (Routine X 2) w Reflex to ID Panel     Status: None   Collection Time: 01/28/23  1:16 PM   Specimen: BLOOD LEFT HAND  Result Value Ref Range Status   Specimen Description BLOOD LEFT HAND  Final   Special Requests   Final    BOTTLES DRAWN AEROBIC AND ANAEROBIC Blood Culture results may not be optimal due to an inadequate volume of blood received in culture bottles   Culture   Final    NO GROWTH 5 DAYS Performed at [facility], [address] [place], [place] [address]    Report Status 02/02/2023 FINAL  Final  Culture, blood (Routine X 2) w Reflex to ID Panel     Status: None   Collection Time:  01/28/23  7:01 PM   Specimen: BLOOD LEFT ARM  Result Value Ref Range Status   Specimen Description BLOOD LEFT ARM  Final   Special Requests   Final    BOTTLES DRAWN AEROBIC AND ANAEROBIC Blood Culture adequate volume   Culture   Final    NO GROWTH 5 DAYS Performed at [facility] [place] [facility], [address] [place], [place] [address]    Report Status 02/02/2023 FINAL  Final     Radiology Studies: No results found.   Pamella Pert, MD, PhD [facility]  Between 7 am - 7 pm I am available, please contact me via Amion (for emergencies) or Securechat (non urgent messages)  Between 7 pm - 7 am I am not available, please contact night coverage MD/APP via Amion

## 2023-02-04 DIAGNOSIS — N179 Acute kidney failure, unspecified: Secondary | ICD-10-CM | POA: Diagnosis not present

## 2023-02-04 DIAGNOSIS — R1314 Dysphagia, pharyngoesophageal phase: Secondary | ICD-10-CM | POA: Diagnosis not present

## 2023-02-04 DIAGNOSIS — N3946 Mixed incontinence: Secondary | ICD-10-CM | POA: Diagnosis not present

## 2023-02-04 DIAGNOSIS — Z8249 Family history of ischemic heart disease and other diseases of the circulatory system: Secondary | ICD-10-CM | POA: Diagnosis not present

## 2023-02-04 DIAGNOSIS — N390 Urinary tract infection, site not specified: Secondary | ICD-10-CM | POA: Diagnosis not present

## 2023-02-04 DIAGNOSIS — K9429 Other complications of gastrostomy: Secondary | ICD-10-CM | POA: Diagnosis not present

## 2023-02-04 DIAGNOSIS — W19XXXA Unspecified fall, initial encounter: Secondary | ICD-10-CM | POA: Diagnosis not present

## 2023-02-04 DIAGNOSIS — Z431 Encounter for attention to gastrostomy: Secondary | ICD-10-CM | POA: Diagnosis not present

## 2023-02-04 DIAGNOSIS — M25511 Pain in right shoulder: Secondary | ICD-10-CM | POA: Diagnosis not present

## 2023-02-04 DIAGNOSIS — M6281 Muscle weakness (generalized): Secondary | ICD-10-CM | POA: Diagnosis not present

## 2023-02-04 DIAGNOSIS — W0110XA Fall on same level from slipping, tripping and stumbling with subsequent striking against unspecified object, initial encounter: Secondary | ICD-10-CM | POA: Diagnosis not present

## 2023-02-04 DIAGNOSIS — S0101XD Laceration without foreign body of scalp, subsequent encounter: Secondary | ICD-10-CM | POA: Diagnosis not present

## 2023-02-04 DIAGNOSIS — K285 Chronic or unspecified gastrojejunal ulcer with perforation: Secondary | ICD-10-CM | POA: Diagnosis not present

## 2023-02-04 DIAGNOSIS — R069 Unspecified abnormalities of breathing: Secondary | ICD-10-CM | POA: Diagnosis not present

## 2023-02-04 DIAGNOSIS — S0101XA Laceration without foreign body of scalp, initial encounter: Secondary | ICD-10-CM | POA: Diagnosis not present

## 2023-02-04 DIAGNOSIS — R0602 Shortness of breath: Secondary | ICD-10-CM | POA: Diagnosis not present

## 2023-02-04 DIAGNOSIS — L89313 Pressure ulcer of right buttock, stage 3: Secondary | ICD-10-CM | POA: Diagnosis not present

## 2023-02-04 DIAGNOSIS — S81811A Laceration without foreign body, right lower leg, initial encounter: Secondary | ICD-10-CM | POA: Diagnosis not present

## 2023-02-04 DIAGNOSIS — D638 Anemia in other chronic diseases classified elsewhere: Secondary | ICD-10-CM | POA: Diagnosis not present

## 2023-02-04 DIAGNOSIS — E43 Unspecified severe protein-calorie malnutrition: Secondary | ICD-10-CM | POA: Diagnosis not present

## 2023-02-04 DIAGNOSIS — Z1624 Resistance to multiple antibiotics: Secondary | ICD-10-CM | POA: Diagnosis not present

## 2023-02-04 DIAGNOSIS — R269 Unspecified abnormalities of gait and mobility: Secondary | ICD-10-CM | POA: Diagnosis not present

## 2023-02-04 DIAGNOSIS — D649 Anemia, unspecified: Secondary | ICD-10-CM | POA: Diagnosis not present

## 2023-02-04 DIAGNOSIS — S199XXA Unspecified injury of neck, initial encounter: Secondary | ICD-10-CM | POA: Diagnosis not present

## 2023-02-04 DIAGNOSIS — Z7401 Bed confinement status: Secondary | ICD-10-CM | POA: Diagnosis not present

## 2023-02-04 DIAGNOSIS — E785 Hyperlipidemia, unspecified: Secondary | ICD-10-CM | POA: Diagnosis not present

## 2023-02-04 DIAGNOSIS — F330 Major depressive disorder, recurrent, mild: Secondary | ICD-10-CM | POA: Diagnosis not present

## 2023-02-04 DIAGNOSIS — R7303 Prediabetes: Secondary | ICD-10-CM | POA: Diagnosis not present

## 2023-02-04 DIAGNOSIS — M109 Gout, unspecified: Secondary | ICD-10-CM | POA: Diagnosis not present

## 2023-02-04 DIAGNOSIS — T85598A Other mechanical complication of other gastrointestinal prosthetic devices, implants and grafts, initial encounter: Secondary | ICD-10-CM | POA: Diagnosis not present

## 2023-02-04 DIAGNOSIS — F339 Major depressive disorder, recurrent, unspecified: Secondary | ICD-10-CM | POA: Diagnosis not present

## 2023-02-04 DIAGNOSIS — Z043 Encounter for examination and observation following other accident: Secondary | ICD-10-CM | POA: Diagnosis not present

## 2023-02-04 DIAGNOSIS — D631 Anemia in chronic kidney disease: Secondary | ICD-10-CM | POA: Diagnosis not present

## 2023-02-04 DIAGNOSIS — G629 Polyneuropathy, unspecified: Secondary | ICD-10-CM | POA: Diagnosis not present

## 2023-02-04 DIAGNOSIS — E860 Dehydration: Secondary | ICD-10-CM | POA: Diagnosis not present

## 2023-02-04 DIAGNOSIS — R2689 Other abnormalities of gait and mobility: Secondary | ICD-10-CM | POA: Diagnosis not present

## 2023-02-04 DIAGNOSIS — K668 Other specified disorders of peritoneum: Secondary | ICD-10-CM | POA: Diagnosis not present

## 2023-02-04 DIAGNOSIS — F419 Anxiety disorder, unspecified: Secondary | ICD-10-CM | POA: Diagnosis not present

## 2023-02-04 DIAGNOSIS — E46 Unspecified protein-calorie malnutrition: Secondary | ICD-10-CM | POA: Diagnosis not present

## 2023-02-04 DIAGNOSIS — E870 Hyperosmolality and hypernatremia: Secondary | ICD-10-CM | POA: Diagnosis not present

## 2023-02-04 DIAGNOSIS — D509 Iron deficiency anemia, unspecified: Secondary | ICD-10-CM | POA: Diagnosis not present

## 2023-02-04 DIAGNOSIS — Z66 Do not resuscitate: Secondary | ICD-10-CM | POA: Diagnosis not present

## 2023-02-04 DIAGNOSIS — Z781 Physical restraint status: Secondary | ICD-10-CM | POA: Diagnosis not present

## 2023-02-04 DIAGNOSIS — R9431 Abnormal electrocardiogram [ECG] [EKG]: Secondary | ICD-10-CM | POA: Diagnosis not present

## 2023-02-04 DIAGNOSIS — G4733 Obstructive sleep apnea (adult) (pediatric): Secondary | ICD-10-CM | POA: Diagnosis not present

## 2023-02-04 DIAGNOSIS — E162 Hypoglycemia, unspecified: Secondary | ICD-10-CM | POA: Diagnosis not present

## 2023-02-04 DIAGNOSIS — R0689 Other abnormalities of breathing: Secondary | ICD-10-CM | POA: Diagnosis not present

## 2023-02-04 DIAGNOSIS — K251 Acute gastric ulcer with perforation: Secondary | ICD-10-CM | POA: Diagnosis not present

## 2023-02-04 DIAGNOSIS — K4090 Unilateral inguinal hernia, without obstruction or gangrene, not specified as recurrent: Secondary | ICD-10-CM | POA: Diagnosis not present

## 2023-02-04 DIAGNOSIS — N3000 Acute cystitis without hematuria: Secondary | ICD-10-CM | POA: Diagnosis not present

## 2023-02-04 DIAGNOSIS — K9423 Gastrostomy malfunction: Secondary | ICD-10-CM | POA: Diagnosis not present

## 2023-02-04 DIAGNOSIS — R531 Weakness: Secondary | ICD-10-CM | POA: Diagnosis not present

## 2023-02-04 DIAGNOSIS — L89893 Pressure ulcer of other site, stage 3: Secondary | ICD-10-CM | POA: Diagnosis not present

## 2023-02-04 DIAGNOSIS — I129 Hypertensive chronic kidney disease with stage 1 through stage 4 chronic kidney disease, or unspecified chronic kidney disease: Secondary | ICD-10-CM | POA: Diagnosis not present

## 2023-02-04 DIAGNOSIS — R1012 Left upper quadrant pain: Secondary | ICD-10-CM | POA: Diagnosis not present

## 2023-02-04 DIAGNOSIS — F411 Generalized anxiety disorder: Secondary | ICD-10-CM | POA: Diagnosis not present

## 2023-02-04 DIAGNOSIS — R1084 Generalized abdominal pain: Secondary | ICD-10-CM | POA: Diagnosis not present

## 2023-02-04 DIAGNOSIS — R42 Dizziness and giddiness: Secondary | ICD-10-CM | POA: Diagnosis not present

## 2023-02-04 DIAGNOSIS — I2510 Atherosclerotic heart disease of native coronary artery without angina pectoris: Secondary | ICD-10-CM | POA: Diagnosis not present

## 2023-02-04 DIAGNOSIS — I10 Essential (primary) hypertension: Secondary | ICD-10-CM | POA: Diagnosis not present

## 2023-02-04 DIAGNOSIS — K219 Gastro-esophageal reflux disease without esophagitis: Secondary | ICD-10-CM | POA: Diagnosis not present

## 2023-02-04 DIAGNOSIS — K631 Perforation of intestine (nontraumatic): Secondary | ICD-10-CM | POA: Diagnosis not present

## 2023-02-04 DIAGNOSIS — K2980 Duodenitis without bleeding: Secondary | ICD-10-CM | POA: Diagnosis not present

## 2023-02-04 DIAGNOSIS — E861 Hypovolemia: Secondary | ICD-10-CM | POA: Diagnosis not present

## 2023-02-04 DIAGNOSIS — K6389 Other specified diseases of intestine: Secondary | ICD-10-CM | POA: Diagnosis not present

## 2023-02-04 DIAGNOSIS — R079 Chest pain, unspecified: Secondary | ICD-10-CM | POA: Diagnosis not present

## 2023-02-04 DIAGNOSIS — Z6841 Body Mass Index (BMI) 40.0 and over, adult: Secondary | ICD-10-CM | POA: Diagnosis not present

## 2023-02-04 DIAGNOSIS — F4320 Adjustment disorder, unspecified: Secondary | ICD-10-CM | POA: Diagnosis not present

## 2023-02-04 DIAGNOSIS — K281 Acute gastrojejunal ulcer with perforation: Secondary | ICD-10-CM | POA: Diagnosis not present

## 2023-02-04 DIAGNOSIS — L89323 Pressure ulcer of left buttock, stage 3: Secondary | ICD-10-CM | POA: Diagnosis not present

## 2023-02-04 DIAGNOSIS — M545 Low back pain, unspecified: Secondary | ICD-10-CM | POA: Diagnosis not present

## 2023-02-04 DIAGNOSIS — K209 Esophagitis, unspecified without bleeding: Secondary | ICD-10-CM | POA: Diagnosis not present

## 2023-02-04 DIAGNOSIS — R5381 Other malaise: Secondary | ICD-10-CM | POA: Diagnosis not present

## 2023-02-04 DIAGNOSIS — S81811D Laceration without foreign body, right lower leg, subsequent encounter: Secondary | ICD-10-CM | POA: Diagnosis not present

## 2023-02-04 DIAGNOSIS — G9341 Metabolic encephalopathy: Secondary | ICD-10-CM | POA: Diagnosis not present

## 2023-02-04 DIAGNOSIS — E872 Acidosis, unspecified: Secondary | ICD-10-CM | POA: Diagnosis not present

## 2023-02-04 DIAGNOSIS — S0990XA Unspecified injury of head, initial encounter: Secondary | ICD-10-CM | POA: Diagnosis not present

## 2023-02-04 DIAGNOSIS — N189 Chronic kidney disease, unspecified: Secondary | ICD-10-CM | POA: Diagnosis not present

## 2023-02-04 DIAGNOSIS — R55 Syncope and collapse: Secondary | ICD-10-CM | POA: Diagnosis not present

## 2023-02-04 DIAGNOSIS — E876 Hypokalemia: Secondary | ICD-10-CM | POA: Diagnosis not present

## 2023-02-04 DIAGNOSIS — R627 Adult failure to thrive: Secondary | ICD-10-CM | POA: Diagnosis not present

## 2023-02-04 DIAGNOSIS — Z743 Need for continuous supervision: Secondary | ICD-10-CM | POA: Diagnosis not present

## 2023-02-04 DIAGNOSIS — F331 Major depressive disorder, recurrent, moderate: Secondary | ICD-10-CM | POA: Diagnosis not present

## 2023-02-04 DIAGNOSIS — R000 Tachycardia, unspecified: Secondary | ICD-10-CM | POA: Diagnosis not present

## 2023-02-04 DIAGNOSIS — E871 Hypo-osmolality and hyponatremia: Secondary | ICD-10-CM | POA: Diagnosis not present

## 2023-02-04 LAB — CBC
HCT: 23.6 % — ABNORMAL LOW (ref 36.0–46.0)
Hemoglobin: 7.6 g/dL — ABNORMAL LOW (ref 12.0–15.0)
MCH: 27.6 pg (ref 26.0–34.0)
MCHC: 32.2 g/dL (ref 30.0–36.0)
MCV: 85.8 fL (ref 80.0–100.0)
Platelets: 338 10*3/uL (ref 150–400)
RBC: 2.75 MIL/uL — ABNORMAL LOW (ref 3.87–5.11)
RDW: 19.9 % — ABNORMAL HIGH (ref 11.5–15.5)
WBC: 10.7 10*3/uL — ABNORMAL HIGH (ref 4.0–10.5)
nRBC: 0.4 % — ABNORMAL HIGH (ref 0.0–0.2)

## 2023-02-04 LAB — BASIC METABOLIC PANEL
Anion gap: 5 (ref 5–15)
BUN: 14 mg/dL (ref 8–23)
CO2: 22 mmol/L (ref 22–32)
Calcium: 7.8 mg/dL — ABNORMAL LOW (ref 8.9–10.3)
Chloride: 111 mmol/L (ref 98–111)
Creatinine, Ser: 0.61 mg/dL (ref 0.44–1.00)
GFR, Estimated: >60 mL/min (ref >60)
Glucose, Bld: 98 mg/dL (ref 70–99)
Potassium: 4.1 mmol/L (ref 3.5–5.1)
Sodium: 138 mmol/L (ref 135–145)

## 2023-02-04 LAB — TYPE AND SCREEN
ABO/RH(D): O POS
Antibody Screen: NEGATIVE
Unit division: 0

## 2023-02-04 LAB — BPAM RBC
Blood Product Expiration Date: 202411052359
ISSUE DATE / TIME: 202410101117
Unit Type and Rh: 5100

## 2023-02-04 LAB — GLUCOSE, CAPILLARY
Glucose-Capillary: 107 mg/dL — ABNORMAL HIGH (ref 70–99)
Glucose-Capillary: 110 mg/dL — ABNORMAL HIGH (ref 70–99)
Glucose-Capillary: 75 mg/dL (ref 70–99)

## 2023-02-04 LAB — MAGNESIUM: Magnesium: 1.6 mg/dL — ABNORMAL LOW (ref 1.7–2.4)

## 2023-02-04 MED ORDER — ENSURE ENLIVE PO LIQD: 237.0000 mL | Freq: Three times a day (TID) | ORAL | Status: DC

## 2023-02-04 MED ORDER — OXYCODONE HCL 5 MG PO TABS: 5.0000 mg | ORAL_TABLET | Freq: Four times a day (QID) | ORAL | 0 refills | Status: DC | PRN

## 2023-02-04 MED ORDER — MAGNESIUM SULFATE 2 GM/50ML IV SOLN
2.0000 g | Freq: Once | INTRAVENOUS | Status: AC
  Administered 2023-02-04: 2 g via INTRAVENOUS
  Filled 2023-02-04: qty 50

## 2023-02-04 MED ORDER — VITAMIN D3 25 MCG PO TABS: 5000.0000 [IU] | ORAL_TABLET | Freq: Every day | ORAL | Status: DC

## 2023-02-04 MED ORDER — PROSOURCE TF20 ENFIT COMPATIBL EN LIQD: 60.0000 mL | Freq: Every day | ENTERAL | Status: DC

## 2023-02-04 MED ORDER — TRAMADOL HCL 50 MG PO TABS: 50.0000 mg | ORAL_TABLET | Freq: Three times a day (TID) | ORAL | 0 refills | Status: DC | PRN

## 2023-02-04 MED ORDER — MIRTAZAPINE 15 MG PO TBDP: 7.5000 mg | ORAL_TABLET | Freq: Every day | ORAL | Status: DC

## 2023-02-04 MED ORDER — VITAMIN B-1 100 MG PO TABS: 100.0000 mg | ORAL_TABLET | Freq: Every day | ORAL | Status: DC

## 2023-02-04 MED ORDER — JEVITY 1.5 CAL/FIBER PO LIQD: 1000.0000 mL | ORAL | Status: DC

## 2023-02-04 NOTE — TOC Transition Note (Addendum)
Transition of Care [facility]) - CM/SW Discharge Note   Patient Details  Name: Christine [name] MRN: [id number] Date of Birth: 1950-11-15  Transition of Care [facility]) CM/SW Contact:  Michaela Corner, LCSWA Phone Number: 02/04/2023, 12:19 PM   Clinical Narrative:   Pt is going to [facility] via [place]. Nurse call report [phone]. Pt room is 118    Final next level of care: Skilled Nursing Facility Barriers to Discharge: Barriers Resolved   Patient Goals and CMS Choice CMS Medicare.gov Compare Post Acute Care list provided to:: Patient Choice offered to / list presented to : Patient  Discharge Placement                Patient chooses bed at: Other - please specify in the comment section below: [facility]) Patient to be transferred to facility by: [facility] Name of family member notified: [name] Patient and family notified of of transfer: 02/04/23  Discharge Plan and Services Additional resources added to the After Visit Summary for   In-house Referral: Clinical Social Work Discharge Planning Services: CM Consult Post Acute Care Choice: Skilled Nursing Facility                               Social Determinants of Health (SDOH) Interventions SDOH Screenings   Food Insecurity: No Food Insecurity (01/18/2023)  Housing: Patient Declined (01/18/2023)  Transportation Needs: No Transportation Needs (01/18/2023)  Utilities: Not At Risk (01/18/2023)  Alcohol Screen: Low Risk  (07/13/2022)  Depression (PHQ2-9): Low Risk  (01/21/2022)  Financial Resource Strain: Low Risk  (12/01/2022)   Received from [facility]  Physical Activity: Insufficiently Active (07/13/2022)  Social Connections: Moderately Integrated (12/01/2022)   Received from [facility]  Stress: No Stress Concern Present (07/13/2022)  Tobacco Use: Medium Risk (01/25/2023)  Health Literacy: Medium Risk (12/01/2022)   Received from [facility]     Readmission Risk Interventions    01/09/2023   11:11 AM   Readmission Risk Prevention Plan  Transportation Screening Complete  HRI or Home Care Consult Complete  Social Work Consult for Recovery Care Planning/Counseling Complete  Palliative Care Screening Not Applicable  Medication Review Oceanographer) Complete

## 2023-02-04 NOTE — Discharge Summary (Signed)
Physician Discharge Summary  Christine [name] ZOX:[id number] DOB: [birth year] DOA: 01/08/2023  PCP: Christine Dory, DO  Admit date: 01/08/2023 Discharge date: 02/04/2023  Admitted From: home Disposition:  SNF  Recommendations for Outpatient Follow-up:  Follow up with PCP in 1-2 weeks Please obtain BMP/CBC in one week  Home Health: none Equipment/Devices: none  Discharge Condition: stable CODE STATUS: DNR Diet Orders (From admission, onward)     Start     Ordered   01/26/23 1450  Diet regular Room service appropriate? Yes with Assist; Fluid consistency: Thin  Diet effective now       Question Answer Comment  Room service appropriate? 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 Dossie [name].  She has been progressively declining since surgery.  She has had multiple hospitalizations and ED visits in past several months.  Recently seen at [facility] on 01/03/23 with complaints of weakness and poor oral intake.  She was hospitalized in July and August of this year at different facilities for dehydration, weakness, acute renal failure and failure to thrive.  She has been residing in a SNF.  She apparently has not been eating or drinking and refusing to take her medications.  She was sent to ER today for evaluation of altered mentation.  She has had 3 days of progressive weakness and refusing oral intake.  She complains of pain all over body which is not new per record review. Pt is a poor historian.   She was found to be severely dehydrated with hypoglycemia and hypernatremic with a sodium of 157.  She also had acute kidney injury and distended gallbladder on CT scan.  CT head with no acute findings.  Pt was started on IV hydration and admission  was requested.     Hospital Course / Discharge diagnoses: Principal Problem:   Failure to thrive in adult Active Problems:   Acute metabolic encephalopathy   Essential hypertension   Gastro-esophageal reflux disease with esophagitis   Osteoarthrosis   OSA on CPAP   GAD (generalized anxiety disorder)   Mixed incontinence urge and stress   Morbid obesity (HCC)   Abdominal pain   Gout   Gastroesophageal reflux disease   Prediabetes   Memory difficulties   Hyperlipidemia   Hypernatremia   Severe dehydration   Altered mental status   AKI (acute kidney injury) (HCC)   Gallbladder dilatation   Esophageal dysphagia   Abnormal esophagram   Hypoglycemia   Pressure injury of skin   Principal problem Hypernatremia / Severe Dehydration -patient was admitted to the hospital significant dehydration and hypernatremia in the setting of poor p.o. intake following her surgery.  She received IV fluids, and with supportive care her sodium and dehydration has now resolved.  She has received a PEG tube and has remained stable with PEG tube feeding.  Active problems Hypoglycemia/Failure to Thrive - due poor oral intake and recent gastric bypass.  Reversible hypoglycemia causes will obtain 2, and am cortisol 25.7. TSH 1.715. W11--9147. Folate 8.6.  Following PEG tube placement and starting of tube feeds and nutritional supplements, hypoglycemia improved. Adult Failure to Thrive/Esophageal dysmotility, refusing to eat - Patient continues to have poor oral intake and no desire to eat.  9/20 Esophagram--advanced esophageal dysmotility but no obstruction.  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  GI consulted, they do not appreciate this being an issue for which she could not eat.  He has been felt that this may be related to a psychiatric component following the surgery.  Psychiatry consulted as well,  patient started on Remeron.  While continuing tube feeds, continue to encourage patient to have p.o. intake  UTI--ESBL E coli -9/20 UA>50 WBC. 9/20 urine culture = MDR Ecoli.  Has been placed on meropenem, status post 7 days completed 9/29.  She has remained stable off antibiotics Anemia of chronic disease -hemoglobin dipped to less than 7 in 1 instance, no bleeding, likely postoperative as well as in the setting of poor nutrition.  She was transfused units of packed red blood cells and hemoglobin has remained stable  Abdominal pain -9/14 CT abd--limited by motion, but  No obvious obstruction, free air or free fluid; s/p gastric bypass. 9/15 Abd US--GB distended, minimal GB sludge.  LFTs were normal.  She also underwent a HIDA scan which showed patent cystic and common bile ducts.  This has resolved.  She underwent a cholecystectomy when she had the PEG tube placement NAGMA -improved after bicarb drip Hypokalemia/Hypomagnesemia/Hypophosphatemia -replaced AKI -baseline creatinine 0.6-1.0, -serum creatinine peaked 1.66, creatinine at baseline currently Acute metabolic encephalopathy - secondary to severe hypernatremia, AKI, mental status now back to baseline OSA  - nightly CPAP  Morbid Obesity - pt is postop s/p 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 Dossie [name].  BMI 43 GERD - continue PPI Sacral pressure wounds - continue local wound care Goals of care -now DNR/DNI, has a PEG tube.  Recommend ongoing follow-up with palliative as an outpatient  Sepsis ruled out   Discharge Instructions   Allergies as of 02/04/2023       Reactions   Prednisone Swelling   Legs swell    Tape Hives   Sulfa Antibiotics Rash        Medication List     STOP taking these medications    amLODipine 10 MG tablet Commonly known as: NORVASC   triamterene-hydrochlorothiazide 37.5-25 MG tablet Commonly known as: MAXZIDE-25       TAKE these medications    acetaminophen 325 MG  tablet Commonly known as: TYLENOL Take 650 mg by mouth every 4 (four) hours as needed for mild pain or moderate pain.   antiseptic oral rinse Liqd 15 mLs by Mouth Rinse route in the morning and at bedtime.   ascorbic acid 500 MG tablet Commonly known as: VITAMIN C Take 500 mg by mouth daily.   colchicine 0.6 MG tablet Take 1.2 mg by mouth daily.   diclofenac Sodium 1 % Gel Commonly known as: VOLTAREN Apply 2 g topically 4 (four) times daily.   DULoxetine 60 MG capsule Commonly known as: CYMBALTA Take 1 capsule (60 mg total) by mouth daily.   feeding supplement (PROSource TF20) liquid Place 60 mLs into feeding tube daily.   ferrous sulfate 325 (65 FE) MG EC tablet Take 325 mg by mouth daily with breakfast.   folic acid 1 MG tablet Commonly known as: FOLVITE Take 1 tablet (1 mg total) by mouth daily.   gabapentin 100 MG capsule Commonly known as: NEURONTIN Take 1 capsule (100 mg total) by mouth 3 (three) times daily.   mirtazapine 15 MG disintegrating tablet Commonly known as: REMERON SOL-TAB Place 0.5 tablets (7.5  mg total) into feeding tube at bedtime.   multivitamin with minerals Tabs tablet Take 1 tablet by mouth daily.   omeprazole 20 MG capsule Commonly known as: PRILOSEC Take 20 mg by mouth daily.   oxybutynin 15 MG 24 hr tablet Commonly known as: DITROPAN XL TAKE 1 TABLET BY MOUTH EVERY NIGHT AT BEDTIME   oxyCODONE 5 MG immediate release tablet Commonly known as: Oxy IR/ROXICODONE Place 1 tablet (5 mg total) into feeding tube every 6 (six) hours as needed for severe pain.   promethazine 25 MG tablet Commonly known as: PHENERGAN Take 1 tablet (25 mg total) by mouth every 6 (six) hours as needed for nausea or vomiting.   ProMod Liqd Take 30 mLs by mouth 2 (two) times daily. What changed: Another medication with the same name was added. Make sure you understand how and when to take each.   feeding supplement Liqd Place 237 mLs into feeding tube 3  (three) times daily between meals. What changed: You were already taking a medication with the same name, and this prescription was added. Make sure you understand how and when to take each.   feeding supplement (JEVITY 1.5 CAL/FIBER) Liqd Place 1,000 mLs into feeding tube daily. What changed: You were already taking a medication with the same name, and this prescription was added. Make sure you understand how and when to take each.   senna 8.6 MG Tabs tablet Commonly known as: SENOKOT Take 1 tablet by mouth at bedtime.   sodium chloride irrigation 0.9 % irrigation Irrigate with 75 mLs as directed See admin instructions. Use 75 mL/hr intravenously every shift for fluid resuscitation for 2 days   thiamine 100 MG tablet Commonly known as: Vitamin B-1 Place 1 tablet (100 mg total) into feeding tube daily.   traMADol 50 MG tablet Commonly known as: ULTRAM Take 1 tablet (50 mg total) by mouth 3 (three) times daily as needed for moderate pain or severe pain.   vitamin D3 25 MCG tablet Commonly known as: CHOLECALCIFEROL Place 5 tablets (5,000 Units total) into feeding tube daily.   zinc gluconate 50 MG tablet Take 50 mg by mouth daily.        Contact information for after-discharge care     Destination     [facility] .   Service: Skilled Nursing Contact information: [address] [place] [address] [phone]                     Consultations: General surgery Palliative Psychiatry   Procedures/Studies:  [facility] Chest Port 1 View  Result Date: 01/28/2023 CLINICAL DATA:  Shortness of breath. EXAM: PORTABLE CHEST 1 VIEW COMPARISON:  Chest CT 01/15/2023 FINDINGS: Right upper extremity PICC tip in the mid SVC. Heart size upper normal. No focal airspace disease, large pleural effusion or pneumothorax. No pulmonary edema. IMPRESSION: Borderline cardiomegaly. No acute chest findings. Electronically Signed   By: Narda Rutherford M.D.   On:  01/28/2023 16:36   DG Cholangiogram Operative  Result Date: 01/26/2023 CLINICAL DATA:  Laparoscopic cholecystectomy with intraoperative cholangiogram EXAM: INTRAOPERATIVE CHOLANGIOGRAM TECHNIQUE: Cholangiographic images from the C-arm fluoroscopic device were submitted for interpretation post-operatively. Please see the procedural report for the amount of contrast and the fluoroscopy time utilized. FLUOROSCOPY: Radiation Exposure Index (as provided by the fluoroscopic device): 8.57 mGy Kerma COMPARISON:  Nuclear medicine HIDA scan 01/17/2023 FINDINGS: Intraoperative cine clip is submitted for review. The images demonstrate cannulation of the cystic duct remanent and intraoperative cholangiogram. No  evidence of biliary ductal dilatation, stenosis or stricture. Contrast material passes through the ampulla and into the duodenum. IMPRESSION: Normal intraoperative cholangiogram. Electronically Signed   By: Malachy Moan M.D.   On: 01/26/2023 06:40   NM Hepato W/EF  Result Date: 01/17/2023 CLINICAL DATA:  Right upper quadrant abdominal pain. EXAM: NUCLEAR MEDICINE HEPATOBILIARY IMAGING WITH GALLBLADDER EF TECHNIQUE: Sequential images of the abdomen were obtained out to 60 minutes following intravenous administration of radiopharmaceutical. After slow intravenous infusion of 2.1 micrograms Cholecystokinin, gallbladder ejection fraction was determined. RADIOPHARMACEUTICALS:  5.5 mCi Tc-51m Choletec IV COMPARISON:  Ultrasound January 09, 2023. FINDINGS: Prompt uptake and biliary excretion of activity by the liver is seen. Gallbladder activity is visualized, consistent with patency of cystic duct. Biliary activity passes into small bowel, consistent with patent common bile duct. Calculated gallbladder ejection fraction is 98%. (At 60 min, normal ejection fraction is greater than 40% and less than 80%.) IMPRESSION: 1.  Patent cystic and common bile ducts. 2. Elevated gallbladder ejection fraction as can be seen  with gallbladder hyperkinesis. Electronically Signed   By: Maudry Mayhew M.D.   On: 01/17/2023 16:37   [place] EKG SITE RITE  Result Date: 01/16/2023 If [facility] image not attached, placement could not be confirmed due to current cardiac rhythm.  CT CHEST W CONTRAST  Result Date: 01/15/2023 CLINICAL DATA:  Questioning esophageal mass. EXAM: CT CHEST WITH CONTRAST TECHNIQUE: Multidetector CT imaging of the chest was performed during intravenous contrast administration. RADIATION DOSE REDUCTION: This exam was performed according to the departmental dose-optimization program which includes automated exposure control, adjustment of the mA and/or kV according to patient size and/or use of iterative reconstruction technique. CONTRAST:  75mL OMNIPAQUE IOHEXOL 300 MG/ML  SOLN COMPARISON:  Esophagram 01/14/2023 FINDINGS: Cardiovascular: No significant vascular findings. Normal heart size. No pericardial effusion. Mediastinum/Nodes: No enlarged mediastinal, hilar, or axillary lymph nodes. Thyroid gland, trachea, and esophagus demonstrate no significant findings. Lungs/Pleura: There is minimal atelectasis in the left lower lobe. The lungs are otherwise clear. There is no pleural effusion or pneumothorax. Upper Abdomen: No acute findings. Rounded 2 point 0 cm left renal hypodensity likely represents a cyst or complex cyst. There are postsurgical changes in the stomach. Musculoskeletal: No chest wall abnormality. No acute or significant osseous findings. IMPRESSION: 1. No acute cardiopulmonary process. 2. Minimal atelectasis in the left lower lobe. Electronically Signed   By: Darliss Cheney M.D.   On: 01/15/2023 22:29   DG ESOPHAGUS W SINGLE CM (SOL OR THIN BA)  Result Date: 01/14/2023 CLINICAL DATA:  Patient with a history of dysphagia. EXAM: ESOPHAGUS/BARIUM SWALLOW/TABLET STUDY TECHNIQUE: Single contrast examination was performed using thin liquid barium. This exam was performed by Alwyn Ren, NP, and was  supervised and interpreted by Rad . FLUOROSCOPY: Fluoro time: 1.12 sec COMPARISON:  None Available. FINDINGS: Swallowing: Significant delay in swallow initiation/mechanism. No vestibular penetration or aspiration seen. Pharynx: Unremarkable. Esophagus: Prominent cricopharyngeal bar. Small diverticulum in the lower esophagus. Questionable extrinsic compression along the anterior aspect of the distal esophagus, best seen on image 45 of series 4. Esophageal motility: Advanced dysmotility with spasms. Hiatal Hernia: None. Gastroesophageal reflux: None visualized. Ingested 13 mm barium tablet: Not given. Patient needs pills crushed. Other: None. IMPRESSION: 1. 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 Further evaluation with endoscopy is recommended. 2. Significant delay in swallow initiation/mechanism. Prominent cricopharyngeal bar. 3. Advanced esophageal dysmotility with spasms. 4. Limited study due to patient's body habitus  and mobility limitations. Electronically Signed   By: Acquanetta Belling M.D.   On: 01/14/2023 13:39   US Abdomen Complete  Result Date: 01/09/2023 CLINICAL DATA:  [id number] Gallbladder dilatation [id number] Generalized abdominal pain [id number] EXAM: ABDOMEN ULTRASOUND COMPLETE COMPARISON:  None Available. FINDINGS: Gallbladder: Gallbladder appears distended. Echogenic structure in the gallbladder is consistent with sludge. No shadowing gallstones or wall thickening visualized. No sonographic Murphy sign noted by sonographer. Common bile duct: Diameter: 0.4cm. Liver: No focal lesion identified. Hyperechoic consistent with fatty infiltration. IVC: No abnormality visualized. Pancreas: Obscured by bowel gas. Spleen: Size and appearance within normal limits. Right Kidney: Length: 10.6 cm. Echogenicity increased consistent with chronic medical renal disease. No mass or hydronephrosis visualized. No shadowing stones. Left  Kidney: Length: 9.6cm. Echogenicity increased consistent with chronic medical renal disease. Cyst measures 5.5 cm. No follow up recommended based on the ultrasound appearance of the cyst. No solid mass or hydronephrosis visualized. No shadowing stones. Abdominal aorta: Obscured by bowel gas. IMPRESSION: 1. Hepatic fatty infiltration. 2. Gallbladder distended. 3. Minimal gallbladder sludge. 4. Echogenic kidneys consistent with chronic medical renal disease. 5. Left kidney cyst. 6. There was limited visualization, as described. Electronically Signed   By: Layla Maw M.D.   On: 01/09/2023 11:17   CT ABDOMEN PELVIS WO CONTRAST  Result Date: 01/08/2023 CLINICAL DATA:  Abdominal pain. EXAM: CT ABDOMEN AND PELVIS WITHOUT CONTRAST TECHNIQUE: Multidetector CT imaging of the abdomen and pelvis was performed following the standard protocol without IV contrast. RADIATION DOSE REDUCTION: This exam was performed according to the departmental dose-optimization program which includes automated exposure control, adjustment of the mA and/or kV according to patient size and/or use of iterative reconstruction technique. COMPARISON:  CT 10/15/2022 and older FINDINGS: Evaluation significant limited by motion despite multiple attempts. As per the CT technologist, the patient had difficulty with the breathing instructions. Lower chest: Lung bases are limited by motion. There is some basilar atelectasis. Coronary artery calcifications are seen. Hepatobiliary: Grossly preserved parenchyma. Gallbladder is dilated. Pancreas: No obvious pancreatic mass. Spleen: Spleen is nonenlarged. Adrenals/Urinary Tract: Nodularity of the adrenal glands, poorly defined with the motion. No renal collecting system dilatation. No obvious obstructing stones. There is some cystic lesions in the left kidney including a larger lower pole focus with some septations and calcifications. Again evaluation is limited but appears similar to previous. Mildly  distended urinary bladder. Stomach/Bowel: Surgical changes gastric bypass. The stomach, small and large bowel are nondilated. There are some scattered stool. Details of the bowel are limited. Vascular/Lymphatic: Scattered vascular calcifications. No obvious dilatation of the abdominal aorta. IVC is nondilated. No obvious lymph node enlargement. Reproductive: Status post hysterectomy. No adnexal masses. Other: Markedly limited by motion. No obvious fluid collections. No large amounts of free air. Musculoskeletal: Scattered degenerative changes of the spine and pelvis. Overall moderate to severe. IMPRESSION: Distended gallbladder. If there is concern of gallbladder pathology ultrasound may be useful as the next step in the workup. Grossly stable left-sided renal cystic foci. Surgical changes of previous gastric bypass Markedly limited examination due to the level of motion. No obvious obstruction, free air or free fluid. In addition, a repeat study could be considered when the patient is more clinically able. Electronically Signed   By: Karen Kays M.D.   On: 01/08/2023 13:41   CT Head Wo Contrast  Result Date: 01/08/2023 CLINICAL DATA:  Altered mental status, weakness for 3 days, confusion EXAM: CT HEAD WITHOUT CONTRAST TECHNIQUE: Contiguous axial images were obtained from the base  of the skull through the vertex without intravenous contrast. RADIATION DOSE REDUCTION: This exam was performed according to the departmental dose-optimization program which includes automated exposure control, adjustment of the mA and/or kV according to patient size and/or use of iterative reconstruction technique. COMPARISON:  01/03/2023 FINDINGS: Brain: No evidence of acute infarction, hemorrhage, hydrocephalus, extra-axial collection or mass lesion/mass effect. Mild periventricular white matter hypodensity. Vascular: No hyperdense vessel or unexpected calcification. Skull: Normal. Negative for fracture or focal lesion.  Sinuses/Orbits: No acute finding. Other: None. IMPRESSION: No acute intracranial pathology.  Small-vessel white matter disease. Electronically Signed   By: Jearld Lesch M.D.   On: 01/08/2023 13:38   DG Chest Port 1 View  Result Date: 01/08/2023 CLINICAL DATA:  Altered mental status. EXAM: PORTABLE CHEST 1 VIEW COMPARISON:  01/03/2023 FINDINGS: Low lung volumes. Similar streaky density in the retrocardiac medial left base, likely vascular anatomy. No focal consolidation, pulmonary edema, or pleural effusion. Cardiopericardial silhouette is at upper limits of normal for size. No acute bony abnormality. Telemetry leads overlie the chest. IMPRESSION: No active disease. Electronically Signed   By: Kennith Center M.D.   On: 01/08/2023 12:23     Subjective: - no chest pain, shortness of breath, no abdominal pain, nausea or vomiting.   Discharge Exam: BP 127/70 (BP Location: Left Arm)   Pulse (!) 108   Temp 97.6 F (36.4 C) (Oral)   Resp 18   Ht 5\' 1"  (1.549 m)   Wt 103 kg   SpO2 100%   BMI 42.91 kg/m   General: Pt is alert, awake, not in acute distress Cardiovascular: RRR, S1/S2 +, no rubs, no gallops Respiratory: CTA bilaterally, no wheezing, no rhonchi Abdominal: Soft, NT, ND, bowel sounds + Extremities: no edema, no cyanosis    The results of significant diagnostics from this hospitalization (including imaging, microbiology, ancillary and laboratory) are listed below for reference.     Microbiology: Recent Results (from the past 240 hour(s))  Culture, blood (Routine X 2) w Reflex to ID Panel     Status: None   Collection Time: 01/28/23  1:16 PM   Specimen: BLOOD LEFT HAND  Result Value Ref Range Status   Specimen Description BLOOD LEFT HAND  Final   Special Requests   Final    BOTTLES DRAWN AEROBIC AND ANAEROBIC Blood Culture results may not be optimal due to an inadequate volume of blood received in culture bottles   Culture   Final    NO GROWTH 5 DAYS Performed at [facility], [address], [place], [place] [address]    Report Status 02/02/2023 FINAL  Final  Culture, blood (Routine X 2) w Reflex to ID Panel     Status: None   Collection Time: 01/28/23  7:01 PM   Specimen: BLOOD LEFT ARM  Result Value Ref Range Status   Specimen Description BLOOD LEFT ARM  Final   Special Requests   Final    BOTTLES DRAWN AEROBIC AND ANAEROBIC Blood Culture adequate volume   Culture   Final    NO GROWTH 5 DAYS Performed at [facility], [address] [place], [place] [address]    Report Status 02/02/2023 FINAL  Final     Labs: Basic Metabolic Panel: Recent Labs  Lab 01/29/23 1504 01/30/23 0353 01/31/23 0351 02/02/23 0413 02/03/23 0410 02/04/23 0303  NA 134* 133* 133* 132* 137 138  K 4.1 3.9 4.0 3.7 3.7 4.1  CL 106 108 108 104 107 111  CO2 16* 18* 19* 20* 23 22  GLUCOSE 118* 90 103* 103* 96 98  BUN 22 21 22 15 13 14   CREATININE 0.82 0.73 0.69 0.62 0.66 0.61  CALCIUM 7.9* 7.7* 7.8* 8.0* 8.0* 7.8*  MG 1.8 1.6* 1.9 1.5*  --  1.6*  PHOS 1.0* 2.9 2.7 2.2*  --   --    Liver Function Tests: Recent Labs  Lab 01/29/23 1504 01/30/23 0353 01/31/23 0351 02/02/23 0413 02/03/23 0410  AST 17  --   --   --  14*  ALT 14  --   --   --  14  ALKPHOS 146*  --   --   --  136*  BILITOT 0.6  --   --   --  0.6  PROT 5.0*  --   --   --  5.0*  ALBUMIN 1.8* 1.7* 1.6* 1.6* 1.6*   CBC: Recent Labs  Lab 01/29/23 1504 01/30/23 0353 01/31/23 0351 02/02/23 0413 02/03/23 0410 02/04/23 0303  WBC 10.2 10.2 10.9* 10.3  --  10.7*  NEUTROABS 8.7*  --   --   --   --   --   HGB 7.5* 7.3* 7.2* 7.0* 6.7* 7.6*  HCT 22.8* 22.2* 21.8* 21.3* 20.7* 23.6*  MCV 81.7 82.5 83.8 84.5  --  85.8  PLT 216 205 247 300  --  338   CBG: Recent Labs  Lab 02/03/23 1133 02/03/23 1651 02/03/23 2022 02/04/23 0021 02/04/23 0400  GLUCAP 90 137* 96 107* 75   Hgb A1c No results for input(s): "HGBA1C" in the last 72 hours. Lipid Profile No results for input(s): "CHOL", "HDL",  "LDLCALC", "TRIG", "CHOLHDL", "LDLDIRECT" in the last 72 hours. Thyroid function studies No results for input(s): "TSH", "T4TOTAL", "T3FREE", "THYROIDAB" in the last 72 hours.  Invalid input(s): "FREET3" Urinalysis    Component Value Date/Time   COLORURINE YELLOW 01/28/2023 1720   APPEARANCEUR CLEAR 01/28/2023 1720   LABSPEC 1.010 01/28/2023 1720   PHURINE 6.0 01/28/2023 1720   GLUCOSEU NEGATIVE 01/28/2023 1720   HGBUR NEGATIVE 01/28/2023 1720   BILIRUBINUR NEGATIVE 01/28/2023 1720   KETONESUR NEGATIVE 01/28/2023 1720   PROTEINUR NEGATIVE 01/28/2023 1720   NITRITE NEGATIVE 01/28/2023 1720   LEUKOCYTESUR NEGATIVE 01/28/2023 1720    FURTHER DISCHARGE INSTRUCTIONS:   Get Medicines reviewed and adjusted: Please take all your medications with you for your next visit with your Primary MD   Laboratory/radiological data: Please request your Primary MD to go over all hospital tests and procedure/radiological results at the follow up, please ask your Primary MD to get all Hospital records sent to his/her office.   In some cases, they will be blood work, cultures and biopsy results pending at the time of your discharge. Please request that your primary care M.D. goes through all the records of your hospital data and follows up on these results.   Also Note the following: If you experience worsening of your admission symptoms, develop shortness of breath, life threatening emergency, suicidal or homicidal thoughts you must seek medical attention immediately by calling 911 or calling your MD immediately  if symptoms less severe.   You must read complete instructions/literature along with all the possible adverse reactions/side effects for all the Medicines you take and that have been prescribed to you. Take any new Medicines after you have completely understood and accpet all the possible adverse reactions/side effects.    Do not drive when taking Pain medications or sleeping medications  (Benzodaizepines)   Do not take  more than prescribed Pain, Sleep and Anxiety Medications. It is not advisable to combine anxiety,sleep and pain medications without talking with your primary care practitioner   Special Instructions: If you have smoked or chewed Tobacco  in the last 2 yrs please stop smoking, stop any regular Alcohol  and or any Recreational drug use.   Wear Seat belts while driving.   Please note: You were cared for by a hospitalist during your hospital stay. Once you are discharged, your primary care physician will handle any further medical issues. Please note that NO REFILLS for any discharge medications will be authorized once you are discharged, as it is imperative that you return to your primary care physician (or establish a relationship with a primary care physician if you do not have one) for your post hospital discharge needs so that they can reassess your need for medications and monitor your lab values.  Time coordinating discharge: 40 minutes  SIGNED:  Pamella Pert, MD, PhD 02/04/2023, 9:28 AM

## 2023-02-04 NOTE — Plan of Care (Signed)
Problem: Education: Goal: Ability to state signs and symptoms to report to health care provider will improve Outcome: Progressing Goal: Knowledge of the prescribed self-care regimen will improve Outcome: Progressing Goal: Knowledge of discharge needs will improve Outcome: Progressing   Problem: Activity: Goal: Ability to tolerate increased activity will improve Outcome: Progressing   Problem: Bowel/Gastric: Goal: Gastrointestinal status for postoperative course will improve Outcome: Progressing Goal: Occurrences of nausea will decrease Outcome: Progressing   Problem: Coping: Goal: Development of coping mechanisms to deal with changes in body function or appearance will improve Outcome: Progressing   Problem: Fluid Volume: Goal: Maintenance of adequate hydration will improve Outcome: Progressing   Problem: Nutritional: Goal: Nutritional status will improve Outcome: Progressing   Problem: Clinical Measurements: Goal: Will show no signs or symptoms of venous thromboembolism Outcome: Progressing Goal: Will remain free from infection Outcome: Progressing Goal: Will show no signs of GI Leak Outcome: Progressing   Problem: Respiratory: Goal: Will regain and/or maintain adequate ventilation Outcome: Progressing   Problem: Pain Management: Goal: Pain level will decrease Outcome: Progressing   Problem: Skin Integrity: Goal: Demonstration of wound healing without infection will improve Outcome: Progressing   Problem: Education: Goal: Knowledge of General Education information will improve Description Including pain rating scale, medication(s)/side effects and non-pharmacologic comfort measures Outcome: Progressing   Problem: Health Behavior/Discharge Planning: Goal: Ability to manage health-related needs will improve Outcome: Progressing   Problem: Clinical Measurements: Goal: Ability to maintain clinical measurements within normal limits will improve Outcome:  Progressing Goal: Will remain free from infection Outcome: Progressing Goal: Diagnostic test results will improve Outcome: Progressing Goal: Respiratory complications will improve Outcome: Progressing Goal: Cardiovascular complication will be avoided Outcome: Progressing   Problem: Activity: Goal: Risk for activity intolerance will decrease Outcome: Progressing   Problem: Nutrition: Goal: Adequate nutrition will be maintained Outcome: Progressing   Problem: Coping: Goal: Level of anxiety will decrease Outcome: Progressing   Problem: Elimination: Goal: Will not experience complications related to bowel motility Outcome: Progressing Goal: Will not experience complications related to urinary retention Outcome: Progressing   Problem: Pain Managment: Goal: General experience of comfort will improve Outcome: Progressing   Problem: Safety: Goal: Ability to remain free from injury will improve Outcome: Progressing   Problem: Skin Integrity: Goal: Risk for impaired skin integrity will decrease Outcome: Progressing

## 2023-02-04 NOTE — Progress Notes (Signed)
Report given to admitting facility

## 2023-02-04 NOTE — Progress Notes (Signed)
CVAD removed per protocol per MD order. Manual pressure applied for 3 mins. Vaseline gauze, gauze, and Tegaderm applied over insertion site. No bleeding or swelling noted. Instructed patient to remain in bed for thirty mins. Educated patient about S/S of infection and when to call MD; no heavy lifting or pressure on R side for 24 hours; keep dressing dry and intact for 24 hours. Pt verbalized comprehension.

## 2023-02-09 DIAGNOSIS — L89313 Pressure ulcer of right buttock, stage 3: Secondary | ICD-10-CM | POA: Diagnosis not present

## 2023-02-09 DIAGNOSIS — E46 Unspecified protein-calorie malnutrition: Secondary | ICD-10-CM | POA: Diagnosis not present

## 2023-02-09 DIAGNOSIS — D631 Anemia in chronic kidney disease: Secondary | ICD-10-CM | POA: Diagnosis not present

## 2023-02-09 DIAGNOSIS — R627 Adult failure to thrive: Secondary | ICD-10-CM | POA: Diagnosis not present

## 2023-02-09 DIAGNOSIS — L89893 Pressure ulcer of other site, stage 3: Secondary | ICD-10-CM | POA: Diagnosis not present

## 2023-02-09 DIAGNOSIS — G9341 Metabolic encephalopathy: Secondary | ICD-10-CM | POA: Diagnosis not present

## 2023-02-09 DIAGNOSIS — L89323 Pressure ulcer of left buttock, stage 3: Secondary | ICD-10-CM | POA: Diagnosis not present

## 2023-02-09 DIAGNOSIS — M6281 Muscle weakness (generalized): Secondary | ICD-10-CM | POA: Diagnosis not present

## 2023-02-09 DIAGNOSIS — N3946 Mixed incontinence: Secondary | ICD-10-CM | POA: Diagnosis not present

## 2023-02-10 DIAGNOSIS — R5381 Other malaise: Secondary | ICD-10-CM | POA: Diagnosis not present

## 2023-02-10 DIAGNOSIS — R627 Adult failure to thrive: Secondary | ICD-10-CM | POA: Diagnosis not present

## 2023-02-10 DIAGNOSIS — M545 Low back pain, unspecified: Secondary | ICD-10-CM | POA: Diagnosis not present

## 2023-02-10 DIAGNOSIS — M109 Gout, unspecified: Secondary | ICD-10-CM | POA: Diagnosis not present

## 2023-02-10 DIAGNOSIS — D638 Anemia in other chronic diseases classified elsewhere: Secondary | ICD-10-CM | POA: Diagnosis not present

## 2023-02-16 DIAGNOSIS — L89313 Pressure ulcer of right buttock, stage 3: Secondary | ICD-10-CM | POA: Diagnosis not present

## 2023-02-16 DIAGNOSIS — E46 Unspecified protein-calorie malnutrition: Secondary | ICD-10-CM | POA: Diagnosis not present

## 2023-02-16 DIAGNOSIS — G9341 Metabolic encephalopathy: Secondary | ICD-10-CM | POA: Diagnosis not present

## 2023-02-16 DIAGNOSIS — D631 Anemia in chronic kidney disease: Secondary | ICD-10-CM | POA: Diagnosis not present

## 2023-02-16 DIAGNOSIS — R627 Adult failure to thrive: Secondary | ICD-10-CM | POA: Diagnosis not present

## 2023-02-16 DIAGNOSIS — L89893 Pressure ulcer of other site, stage 3: Secondary | ICD-10-CM | POA: Diagnosis not present

## 2023-02-16 DIAGNOSIS — N3946 Mixed incontinence: Secondary | ICD-10-CM | POA: Diagnosis not present

## 2023-02-16 DIAGNOSIS — L89323 Pressure ulcer of left buttock, stage 3: Secondary | ICD-10-CM | POA: Diagnosis not present

## 2023-02-16 DIAGNOSIS — M6281 Muscle weakness (generalized): Secondary | ICD-10-CM | POA: Diagnosis not present

## 2023-02-22 DIAGNOSIS — I10 Essential (primary) hypertension: Secondary | ICD-10-CM | POA: Diagnosis not present

## 2023-02-22 DIAGNOSIS — F411 Generalized anxiety disorder: Secondary | ICD-10-CM | POA: Diagnosis not present

## 2023-02-22 DIAGNOSIS — R55 Syncope and collapse: Secondary | ICD-10-CM | POA: Diagnosis not present

## 2023-02-22 DIAGNOSIS — E43 Unspecified severe protein-calorie malnutrition: Secondary | ICD-10-CM | POA: Diagnosis not present

## 2023-02-22 DIAGNOSIS — R627 Adult failure to thrive: Secondary | ICD-10-CM | POA: Diagnosis not present

## 2023-02-23 DIAGNOSIS — R55 Syncope and collapse: Secondary | ICD-10-CM | POA: Diagnosis not present

## 2023-02-23 DIAGNOSIS — L89313 Pressure ulcer of right buttock, stage 3: Secondary | ICD-10-CM | POA: Diagnosis not present

## 2023-02-23 DIAGNOSIS — D631 Anemia in chronic kidney disease: Secondary | ICD-10-CM | POA: Diagnosis not present

## 2023-02-23 DIAGNOSIS — L89323 Pressure ulcer of left buttock, stage 3: Secondary | ICD-10-CM | POA: Diagnosis not present

## 2023-02-23 DIAGNOSIS — S81811A Laceration without foreign body, right lower leg, initial encounter: Secondary | ICD-10-CM | POA: Diagnosis not present

## 2023-02-23 DIAGNOSIS — G9341 Metabolic encephalopathy: Secondary | ICD-10-CM | POA: Diagnosis not present

## 2023-02-23 DIAGNOSIS — M6281 Muscle weakness (generalized): Secondary | ICD-10-CM | POA: Diagnosis not present

## 2023-02-23 DIAGNOSIS — L89893 Pressure ulcer of other site, stage 3: Secondary | ICD-10-CM | POA: Diagnosis not present

## 2023-02-23 DIAGNOSIS — R627 Adult failure to thrive: Secondary | ICD-10-CM | POA: Diagnosis not present

## 2023-02-23 DIAGNOSIS — I10 Essential (primary) hypertension: Secondary | ICD-10-CM | POA: Diagnosis not present

## 2023-02-23 DIAGNOSIS — E46 Unspecified protein-calorie malnutrition: Secondary | ICD-10-CM | POA: Diagnosis not present

## 2023-02-23 DIAGNOSIS — N3946 Mixed incontinence: Secondary | ICD-10-CM | POA: Diagnosis not present

## 2023-02-24 ENCOUNTER — Encounter (HOSPITAL_COMMUNITY): Payer: Self-pay

## 2023-02-24 ENCOUNTER — Emergency Department (HOSPITAL_COMMUNITY): Payer: Medicare HMO

## 2023-02-24 ENCOUNTER — Emergency Department (HOSPITAL_COMMUNITY)
Admission: EM | Admit: 2023-02-24 | Discharge: 2023-02-25 | Disposition: A | Discharge status: Rehabilitation Facility | Payer: Medicare HMO | Attending: Emergency Medicine | Admitting: Emergency Medicine

## 2023-02-24 DIAGNOSIS — N3000 Acute cystitis without hematuria: Secondary | ICD-10-CM | POA: Insufficient documentation

## 2023-02-24 DIAGNOSIS — I129 Hypertensive chronic kidney disease with stage 1 through stage 4 chronic kidney disease, or unspecified chronic kidney disease: Secondary | ICD-10-CM | POA: Insufficient documentation

## 2023-02-24 DIAGNOSIS — S0990XA Unspecified injury of head, initial encounter: Secondary | ICD-10-CM | POA: Diagnosis not present

## 2023-02-24 DIAGNOSIS — I2510 Atherosclerotic heart disease of native coronary artery without angina pectoris: Secondary | ICD-10-CM | POA: Insufficient documentation

## 2023-02-24 DIAGNOSIS — Z043 Encounter for examination and observation following other accident: Secondary | ICD-10-CM | POA: Diagnosis not present

## 2023-02-24 DIAGNOSIS — S0101XA Laceration without foreign body of scalp, initial encounter: Secondary | ICD-10-CM | POA: Insufficient documentation

## 2023-02-24 DIAGNOSIS — W0110XA Fall on same level from slipping, tripping and stumbling with subsequent striking against unspecified object, initial encounter: Secondary | ICD-10-CM | POA: Insufficient documentation

## 2023-02-24 DIAGNOSIS — S199XXA Unspecified injury of neck, initial encounter: Secondary | ICD-10-CM | POA: Diagnosis not present

## 2023-02-24 DIAGNOSIS — M25511 Pain in right shoulder: Secondary | ICD-10-CM | POA: Insufficient documentation

## 2023-02-24 DIAGNOSIS — W19XXXA Unspecified fall, initial encounter: Secondary | ICD-10-CM

## 2023-02-24 DIAGNOSIS — N189 Chronic kidney disease, unspecified: Secondary | ICD-10-CM | POA: Insufficient documentation

## 2023-02-24 LAB — URINALYSIS, ROUTINE W REFLEX MICROSCOPIC
Bilirubin Urine: NEGATIVE
Glucose, UA: NEGATIVE mg/dL
Hgb urine dipstick: NEGATIVE
Ketones, ur: NEGATIVE mg/dL
Nitrite: POSITIVE — AB
Protein, ur: 30 mg/dL — AB
Specific Gravity, Urine: 1.019 (ref 1.005–1.030)
Squamous Epithelial / HPF: >50 /[HPF] (ref 0–5)
WBC, UA: >50 WBC/hpf (ref 0–5)
pH: 5 (ref 5.0–8.0)

## 2023-02-24 LAB — CBC WITH DIFFERENTIAL/PLATELET
Abs Immature Granulocytes: 0.03 10*3/uL (ref 0.00–0.07)
Basophils Absolute: 0.1 10*3/uL (ref 0.0–0.1)
Basophils Relative: 1 %
Eosinophils Absolute: 0.3 10*3/uL (ref 0.0–0.5)
Eosinophils Relative: 5 %
HCT: 30.1 % — ABNORMAL LOW (ref 36.0–46.0)
Hemoglobin: 9.3 g/dL — ABNORMAL LOW (ref 12.0–15.0)
Immature Granulocytes: 1 %
Lymphocytes Relative: 38 %
Lymphs Abs: 2.4 10*3/uL (ref 0.7–4.0)
MCH: 28.5 pg (ref 26.0–34.0)
MCHC: 30.9 g/dL (ref 30.0–36.0)
MCV: 92.3 fL (ref 80.0–100.0)
Monocytes Absolute: 0.4 10*3/uL (ref 0.1–1.0)
Monocytes Relative: 6 %
Neutro Abs: 3.2 10*3/uL (ref 1.7–7.7)
Neutrophils Relative %: 49 %
Platelets: 382 10*3/uL (ref 150–400)
RBC: 3.26 MIL/uL — ABNORMAL LOW (ref 3.87–5.11)
RDW: 19.9 % — ABNORMAL HIGH (ref 11.5–15.5)
WBC: 6.3 10*3/uL (ref 4.0–10.5)
nRBC: 0 % (ref 0.0–0.2)

## 2023-02-24 LAB — COMPREHENSIVE METABOLIC PANEL
ALT: 21 U/L (ref 0–44)
AST: 25 U/L (ref 15–41)
Albumin: 2.7 g/dL — ABNORMAL LOW (ref 3.5–5.0)
Alkaline Phosphatase: 76 U/L (ref 38–126)
Anion gap: 11 (ref 5–15)
BUN: 23 mg/dL (ref 8–23)
CO2: 17 mmol/L — ABNORMAL LOW (ref 22–32)
Calcium: 9.1 mg/dL (ref 8.9–10.3)
Chloride: 111 mmol/L (ref 98–111)
Creatinine, Ser: 0.83 mg/dL (ref 0.44–1.00)
GFR, Estimated: >60 mL/min (ref >60)
Glucose, Bld: 98 mg/dL (ref 70–99)
Potassium: 3.4 mmol/L — ABNORMAL LOW (ref 3.5–5.1)
Sodium: 139 mmol/L (ref 135–145)
Total Bilirubin: 0.8 mg/dL (ref 0.3–1.2)
Total Protein: 6.3 g/dL — ABNORMAL LOW (ref 6.5–8.1)

## 2023-02-24 MED ORDER — LIDOCAINE-EPINEPHRINE (PF) 2 %-1:200000 IJ SOLN
10.0000 mL | Freq: Once | INTRAMUSCULAR | Status: AC
  Administered 2023-02-24: 10 mL
  Filled 2023-02-24: qty 20

## 2023-02-24 MED ORDER — OXYCODONE HCL 5 MG PO TABS
5.0000 mg | ORAL_TABLET | Freq: Once | ORAL | Status: AC
  Administered 2023-02-24: 5 mg via ORAL
  Filled 2023-02-24: qty 1

## 2023-02-24 MED ORDER — SODIUM CHLORIDE 0.9 % IV SOLN
1.0000 g | Freq: Once | INTRAVENOUS | Status: AC
  Administered 2023-02-24: 1 g via INTRAVENOUS
  Filled 2023-02-24: qty 10

## 2023-02-24 MED ORDER — CEPHALEXIN 500 MG PO CAPS: 500.0000 mg | ORAL_CAPSULE | Freq: Three times a day (TID) | ORAL | 0 refills | Status: DC

## 2023-02-24 MED ORDER — SODIUM CHLORIDE 0.9 % IV BOLUS
500.0000 mL | Freq: Once | INTRAVENOUS | Status: AC
  Administered 2023-02-24: 500 mL via INTRAVENOUS

## 2023-02-24 NOTE — ED Notes (Signed)
Report given to OG at [facility]. All questions answered at this time

## 2023-02-24 NOTE — Discharge Instructions (Addendum)
Staples to be removed in 10 days.

## 2023-02-24 NOTE — ED Provider Notes (Signed)
[place] EMERGENCY DEPARTMENT AT [facility] Provider Note   CSN: [id number] Arrival date & time: 02/24/23  1525     History  Chief Complaint  Patient presents with   Christine [name] is a 72 y.o. female.  Pt is a 72 yo female with pmhx significant for obesity, htn, gout, gerd, migraines, arthritis, anxiety, ckd, cad, anemia, osa, and s/p gastric bypass requiring g-tube feedings. Pt has been eating a lot more since her last surgery on 10/1. Pt said she became dizzy today and fell and hit her head.  She did sustain a lac to her scalp on the right side.  No loc.  Pt is not on blood thinners.         Home Medications Prior to Admission medications   Medication Sig Start Date End Date Taking? Authorizing Provider  cephALEXin (KEFLEX) 500 MG capsule Take 1 capsule (500 mg total) by mouth 3 (three) times daily. 02/24/23  Yes Jacalyn Lefevre, MD  acetaminophen (TYLENOL) 325 MG tablet Take 650 mg by mouth every 4 (four) hours as needed for mild pain or moderate pain.    [provider]  antiseptic oral rinse (BIOTENE) LIQD 15 mLs by Mouth Rinse route in the morning and at bedtime.    [provider]  ascorbic acid (VITAMIN C) 500 MG tablet Take 500 mg by mouth daily.    [provider]  cholecalciferol (CHOLECALCIFEROL) 25 MCG tablet Place 5 tablets (5,000 Units total) into feeding tube daily. 02/04/23   Leatha Gilding, MD  colchicine 0.6 MG tablet Take 1.2 mg by mouth daily.    [provider]  diclofenac Sodium (VOLTAREN) 1 % GEL Apply 2 g topically 4 (four) times daily.    [provider]  DULoxetine (CYMBALTA) 60 MG capsule Take 1 capsule (60 mg total) by mouth daily. 12/25/21   [name], DO  feeding supplement (ENSURE ENLIVE / ENSURE PLUS) LIQD Place 237 mLs into feeding tube 3 (three) times daily between meals. 02/04/23   Leatha Gilding, MD  ferrous sulfate 325 (65 FE) MG EC tablet Take 325 mg by mouth  daily with breakfast.    [provider]  folic acid (FOLVITE) 1 MG tablet Take 1 tablet (1 mg total) by mouth daily. 11/16/22   Azucena Fallen, MD  gabapentin (NEURONTIN) 100 MG capsule Take 1 capsule (100 mg total) by mouth 3 (three) times daily. 11/15/22   Azucena Fallen, MD  mirtazapine (REMERON SOL-TAB) 15 MG disintegrating tablet Place 0.5 tablets (7.5 mg total) into feeding tube at bedtime. 02/04/23   Leatha Gilding, MD  Multiple Vitamin (MULTIVITAMIN WITH MINERALS) TABS tablet Take 1 tablet by mouth daily. 11/16/22   Azucena Fallen, MD  Nutritional Supplements (FEEDING SUPPLEMENT, JEVITY 1.5 CAL/FIBER,) LIQD Place 1,000 mLs into feeding tube daily. 02/04/23   Leatha Gilding, MD  Nutritional Supplements (PROMOD) LIQD Take 30 mLs by mouth 2 (two) times daily.    [provider]  omeprazole (PRILOSEC) 20 MG capsule Take 20 mg by mouth daily.    [provider]  oxybutynin (DITROPAN XL) 15 MG 24 hr tablet TAKE 1 TABLET BY MOUTH EVERY NIGHT AT BEDTIME 12/30/22   Wendling, Jilda Roche, DO  oxyCODONE (OXY IR/ROXICODONE) 5 MG immediate release tablet Place 1 tablet (5 mg total) into feeding tube every 6 (six) hours as needed for severe pain. 02/04/23   Leatha Gilding, MD  promethazine (PHENERGAN) 25 MG  tablet Take 1 tablet (25 mg total) by mouth every 6 (six) hours as needed for nausea or vomiting. 10/27/22   [name], DO  Protein (FEEDING SUPPLEMENT, PROSOURCE TF20,) liquid Place 60 mLs into feeding tube daily. 02/04/23   Leatha Gilding, MD  senna (SENOKOT) 8.6 MG TABS tablet Take 1 tablet by mouth at bedtime.    [provider]  sodium chloride irrigation 0.9 % irrigation Irrigate with 75 mLs as directed See admin instructions. Use 75 mL/hr intravenously every shift for fluid resuscitation for 2 days    [provider]  thiamine (VITAMIN B-1) 100 MG tablet Place 1 tablet (100 mg total) into feeding tube daily.  02/04/23   Leatha Gilding, MD  traMADol (ULTRAM) 50 MG tablet Take 1 tablet (50 mg total) by mouth 3 (three) times daily as needed for moderate pain or severe pain. 02/04/23   Leatha Gilding, MD  zinc gluconate 50 MG tablet Take 50 mg by mouth daily.    [provider]      Allergies    Prednisone, Tape, and Sulfa antibiotics    Review of Systems   Review of Systems  Skin:  Positive for wound.  Neurological:  Positive for weakness.  All other systems reviewed and are negative.   Physical Exam Updated Vital Signs BP 108/69   Pulse (!) 117   Temp 98 F (36.7 C)   Resp 18   SpO2 98%  Physical Exam Vitals and nursing note reviewed.  Constitutional:      Appearance: Normal appearance. She is obese.  HENT:     Head: Normocephalic.     Comments: Laceration right side of head    Right Ear: External ear normal.     Left Ear: External ear normal.     Nose: Nose normal.     Mouth/Throat:     Mouth: Mucous membranes are dry.     Pharynx: Oropharynx is clear.  Eyes:     Extraocular Movements: Extraocular movements intact.     Conjunctiva/sclera: Conjunctivae normal.     Pupils: Pupils are equal, round, and reactive to light.  Neck:     Comments: In c-collar Cardiovascular:     Rate and Rhythm: Normal rate and regular rhythm.     Pulses: Normal pulses.     Heart sounds: Normal heart sounds.  Pulmonary:     Effort: Pulmonary effort is normal.     Breath sounds: Normal breath sounds.  Abdominal:     General: Abdomen is flat. Bowel sounds are normal.     Palpations: Abdomen is soft.  Musculoskeletal:       Arms:     Comments: Tenderness to palpation to right shoulder blade.  Skin:    General: Skin is warm.     Capillary Refill: Capillary refill takes less than 2 seconds.  Neurological:     General: No focal deficit present.     Mental Status: She is alert and oriented to person, place, and time.  Psychiatric:        Mood and Affect: Mood normal.         Behavior: Behavior normal.     ED Results / Procedures / Treatments   Labs (all labs ordered are listed, but only abnormal results are displayed) Labs Reviewed  COMPREHENSIVE METABOLIC PANEL - Abnormal; Notable for the following components:      Result Value   Potassium 3.4 (*)    CO2 17 (*)  Total Protein 6.3 (*)    Albumin 2.7 (*)    All other components within normal limits  CBC WITH DIFFERENTIAL/PLATELET - Abnormal; Notable for the following components:   RBC 3.26 (*)    Hemoglobin 9.3 (*)    HCT 30.1 (*)    RDW 19.9 (*)    All other components within normal limits  URINALYSIS, ROUTINE W REFLEX MICROSCOPIC - Abnormal; Notable for the following components:   Color, Urine AMBER (*)    APPearance CLOUDY (*)    Protein, ur 30 (*)    Nitrite POSITIVE (*)    Leukocytes,Ua MODERATE (*)    Bacteria, UA MANY (*)    All other components within normal limits    EKG EKG Interpretation Date/Time:  Thursday February 24 2023 15:30:53 EDT Ventricular Rate:  110 PR Interval:  165 QRS Duration:  85 QT Interval:  338 QTC Calculation: 458 R Axis:   60  Text Interpretation: Sinus tachycardia No significant change since last tracing Confirmed by Jacalyn Lefevre [phone]) on 02/24/2023 3:36:58 PM  Radiology DG Shoulder Right  Result Date: 02/24/2023 CLINICAL DATA:  fall EXAM: RIGHT SHOULDER - 2+ VIEW COMPARISON:  None Available. FINDINGS: There is no evidence of fracture or dislocation. There is no evidence of arthropathy or other focal bone abnormality. Soft tissues are unremarkable. IMPRESSION: Negative. Electronically Signed   By: Corlis Leak M.D.   On: 02/24/2023 18:53   DG Pelvis 1-2 Views  Result Date: 02/24/2023 CLINICAL DATA:  Fall. EXAM: PELVIS - 1-2 VIEW COMPARISON:  None Available. FINDINGS: There is no evidence of pelvic fracture or diastasis. No pelvic bone lesions are seen. IMPRESSION: Negative. Electronically Signed   By: Orvan Falconer M.D.   On: 02/24/2023 16:54    DG Chest 2 View  Result Date: 02/24/2023 CLINICAL DATA:  Fall. EXAM: CHEST - 2 VIEW COMPARISON:  Chest radiograph 01/28/2023. FINDINGS: Clear lungs. Normal heart size and mediastinal contours. No pleural effusion or pneumothorax. Visualized bones and upper abdomen are unremarkable. IMPRESSION: No evidence of acute cardiopulmonary disease. Electronically Signed   By: Orvan Falconer M.D.   On: 02/24/2023 16:53   CT Head Wo Contrast  Result Date: 02/24/2023 CLINICAL DATA:  Polytrauma, blunt. EXAM: CT HEAD WITHOUT CONTRAST CT CERVICAL SPINE WITHOUT CONTRAST TECHNIQUE: Multidetector CT imaging of the head and cervical spine was performed following the standard protocol without intravenous contrast. Multiplanar CT image reconstructions of the cervical spine were also generated. RADIATION DOSE REDUCTION: This exam was performed according to the departmental dose-optimization program which includes automated exposure control, adjustment of the mA and/or kV according to patient size and/or use of iterative reconstruction technique. COMPARISON:  Head CT 01/08/2023. FINDINGS: CT HEAD FINDINGS Brain: No acute hemorrhage. Unchanged moderate chronic small-vessel disease. Cortical gray-white differentiation is otherwise preserved. Prominence of the ventricles and sulci within expected range for age. No hydrocephalus or extra-axial collection. No mass effect or midline shift. Vascular: No hyperdense vessel or unexpected calcification. Skull: No calvarial fracture or suspicious bone lesion. Skull base is unremarkable. Sinuses/Orbits: Trace fluid in the left sphenoid sinus. Orbits are unremarkable. Other: Right parietal scalp laceration. CT CERVICAL SPINE FINDINGS Alignment: Normal. Skull base and vertebrae: No acute fracture. Normal craniocervical junction. No suspicious bone lesions. Soft tissues and spinal canal: No prevertebral fluid or swelling. No visible canal hematoma. Disc levels: Multilevel cervical  spondylosis, worst at C5-6, where there is at least mild spinal canal stenosis. Upper chest: No acute findings. Other: None. IMPRESSION: 1. No acute intracranial abnormality.  Right parietal scalp laceration. 2. No acute cervical spine fracture or traumatic listhesis. Electronically Signed   By: Orvan Falconer M.D.   On: 02/24/2023 16:52   CT Cervical Spine Wo Contrast  Result Date: 02/24/2023 CLINICAL DATA:  Polytrauma, blunt. EXAM: CT HEAD WITHOUT CONTRAST CT CERVICAL SPINE WITHOUT CONTRAST TECHNIQUE: Multidetector CT imaging of the head and cervical spine was performed following the standard protocol without intravenous contrast. Multiplanar CT image reconstructions of the cervical spine were also generated. RADIATION DOSE REDUCTION: This exam was performed according to the departmental dose-optimization program which includes automated exposure control, adjustment of the mA and/or kV according to patient size and/or use of iterative reconstruction technique. COMPARISON:  Head CT 01/08/2023. FINDINGS: CT HEAD FINDINGS Brain: No acute hemorrhage. Unchanged moderate chronic small-vessel disease. Cortical gray-white differentiation is otherwise preserved. Prominence of the ventricles and sulci within expected range for age. No hydrocephalus or extra-axial collection. No mass effect or midline shift. Vascular: No hyperdense vessel or unexpected calcification. Skull: No calvarial fracture or suspicious bone lesion. Skull base is unremarkable. Sinuses/Orbits: Trace fluid in the left sphenoid sinus. Orbits are unremarkable. Other: Right parietal scalp laceration. CT CERVICAL SPINE FINDINGS Alignment: Normal. Skull base and vertebrae: No acute fracture. Normal craniocervical junction. No suspicious bone lesions. Soft tissues and spinal canal: No prevertebral fluid or swelling. No visible canal hematoma. Disc levels: Multilevel cervical spondylosis, worst at C5-6, where there is at least mild spinal canal stenosis.  Upper chest: No acute findings. Other: None. IMPRESSION: 1. No acute intracranial abnormality. Right parietal scalp laceration. 2. No acute cervical spine fracture or traumatic listhesis. Electronically Signed   By: Orvan Falconer M.D.   On: 02/24/2023 16:52    Procedures .Marland KitchenLaceration Repair  Date/Time: 02/24/2023 7:21 PM  Performed by: Jacalyn Lefevre, MD Authorized by: Jacalyn Lefevre, MD   Consent:    Consent obtained:  Verbal   Consent given by:  Patient   Alternatives discussed:  No treatment Universal protocol:    Patient identity confirmed:  Verbally with patient Anesthesia:    Anesthesia method:  Local infiltration   Local anesthetic:  Lidocaine 2% WITH epi Laceration details:    Location:  Scalp   Length (cm):  15 Pre-procedure details:    Preparation:  Patient was prepped and draped in usual sterile fashion Exploration:    Hemostasis achieved with:  Epinephrine Treatment:    Area cleansed with:  Povidone-iodine and saline   Amount of cleaning:  Standard Skin repair:    Repair method:  Staples   Number of staples:  15 Approximation:    Approximation:  Close Repair type:    Repair type:  Intermediate Post-procedure details:    Dressing:  Adhesive bandage   Procedure completion:  Tolerated well, no immediate complications     Medications Ordered in ED Medications  oxyCODONE (Oxy IR/ROXICODONE) immediate release tablet 5 mg (has no administration in time range)  cefTRIAXone (ROCEPHIN) 1 g in sodium chloride 0.9 % 100 mL IVPB (has no administration in time range)  lidocaine-EPINEPHrine (XYLOCAINE W/EPI) 2 %-1:200000 (PF) injection 10 mL (10 mLs Infiltration Given 02/24/23 1726)  sodium chloride 0.9 % bolus 500 mL (500 mLs Intravenous New Bag/Given 02/24/23 1726)    ED Course/ Medical Decision Making/ A&P                                 Medical Decision Making Amount and/or Complexity of Data Reviewed Labs: ordered.  Radiology:  ordered.  Risk Prescription drug management.   This patient presents to the ED for concern of fall, this involves an extensive number of treatment options, and is a complaint that carries with it a high risk of complications and morbidity.  The differential diagnosis includes anemia, electrolyte abn, multiple trauma   Co morbidities that complicate the patient evaluation  obesity, htn, gout, gerd, migraines, arthritis, anxiety, ckd, cad, anemia, osa, and s/p gastric bypass requiring g-tube feedings   Additional history obtained:  Additional history obtained from epic chart review External records from outside source obtained and reviewed including EMS report   Lab Tests:  I Ordered, and personally interpreted labs.  The pertinent results include:  cbc with hgb 9.3 (hgb 7.6 2 weeks ago); cmp with CO2 low at 17, but otherwise nl; ua with uti   Imaging Studies ordered:  I ordered imaging studies including ct head, ct c-spine, pelvis, chest, right shoulder  I independently visualized and interpreted imaging which showed  CT head/c-spine: No acute intracranial abnormality. Right parietal scalp  laceration.  2. No acute cervical spine fracture or traumatic listhesis.  CXR: No evidence of acute cardiopulmonary disease.  Pelvic: Negative.  R shoulder: Negative I agree with the radiologist interpretation   Cardiac Monitoring:  The patient was maintained on a cardiac monitor.  I personally viewed and interpreted the cardiac monitored which showed an underlying rhythm of: nsr   Medicines ordered and prescription drug management:  I ordered medication including ivfs  for sx Reevaluation of the patient after these medicines showed that the patient improved I have reviewed the patients home medicines and have made adjustments as needed   Test Considered:  ct   Critical Interventions:  abx   Problem List / ED Course:  Lac:  lac stapled.  Staples to be removed in 10  days. UTI:  Rocephin given in the ED.  She is d/c with keflex.   Reevaluation:  After the interventions noted above, I reevaluated the patient and found that they have :improved   Social Determinants of Health:  Lives at a facility   Dispostion:  After consideration of the diagnostic results and the patients response to treatment, I feel that the patent would benefit from discharge with outpatient f/u.          Final Clinical Impression(s) / ED Diagnoses Final diagnoses:  Fall, initial encounter  Laceration of scalp, initial encounter  Acute cystitis without hematuria    Rx / DC Orders ED Discharge Orders          Ordered    cephALEXin (KEFLEX) 500 MG capsule  3 times daily        02/24/23 2035              Jacalyn Lefevre, MD 02/24/23 2039

## 2023-02-24 NOTE — ED Triage Notes (Signed)
Pt is coming is coming in for a fall that occurred due to dizziness and she felt weak, she fell face first into a closet hitting her head causing a laceration on he right side of her head. Medic reports its 4 to 5 inches in length and deep. [place]. Pt has complaints of dizziness and weakness for the last several days. No other injuries from this fall today and no deformed extremities.   Medic vitals   146/88 108hr 100%ra 221bgl  20g left ac

## 2023-02-24 NOTE — ED Notes (Signed)
Pt used the bed pan urine did not get in bedpan.

## 2023-02-25 ENCOUNTER — Telehealth: Payer: Self-pay | Admitting: *Deleted

## 2023-02-25 DIAGNOSIS — Z743 Need for continuous supervision: Secondary | ICD-10-CM | POA: Diagnosis not present

## 2023-02-25 DIAGNOSIS — Z7401 Bed confinement status: Secondary | ICD-10-CM | POA: Diagnosis not present

## 2023-02-25 DIAGNOSIS — R000 Tachycardia, unspecified: Secondary | ICD-10-CM | POA: Diagnosis not present

## 2023-02-25 NOTE — ED Notes (Signed)
All required forms and pt. Belongings given to PTAR.

## 2023-02-25 NOTE — Telephone Encounter (Signed)
Unit Manager, Bobbie Stack called regarding length of time resident should take Rx Keflex.  RNCM consulted EDP to confirm 7 days.  Relayed message to [place].

## 2023-02-28 ENCOUNTER — Telehealth: Payer: Self-pay

## 2023-02-28 ENCOUNTER — Telehealth: Payer: Self-pay | Admitting: *Deleted

## 2023-02-28 DIAGNOSIS — G629 Polyneuropathy, unspecified: Secondary | ICD-10-CM | POA: Diagnosis not present

## 2023-02-28 DIAGNOSIS — N3000 Acute cystitis without hematuria: Secondary | ICD-10-CM | POA: Diagnosis not present

## 2023-02-28 DIAGNOSIS — F411 Generalized anxiety disorder: Secondary | ICD-10-CM | POA: Diagnosis not present

## 2023-02-28 DIAGNOSIS — K219 Gastro-esophageal reflux disease without esophagitis: Secondary | ICD-10-CM | POA: Diagnosis not present

## 2023-02-28 DIAGNOSIS — I10 Essential (primary) hypertension: Secondary | ICD-10-CM | POA: Diagnosis not present

## 2023-02-28 DIAGNOSIS — S0101XD Laceration without foreign body of scalp, subsequent encounter: Secondary | ICD-10-CM | POA: Diagnosis not present

## 2023-02-28 DIAGNOSIS — R627 Adult failure to thrive: Secondary | ICD-10-CM | POA: Diagnosis not present

## 2023-02-28 NOTE — Telephone Encounter (Signed)
Unit manager, Carney Bern called regarding removal of staples for the resident.  RNCM advised that pt does not need to return to ED for staple removal.

## 2023-02-28 NOTE — Telephone Encounter (Signed)
Pt called and wanted to let dr.Wendling know why it has been so long since she saw him. Stated she had gastric bypass and had complications which caused her to have to learn how to walk again. She stated she did not want to re-est just wanted dr.Wendling know what happened.

## 2023-03-02 DIAGNOSIS — E46 Unspecified protein-calorie malnutrition: Secondary | ICD-10-CM | POA: Diagnosis not present

## 2023-03-02 DIAGNOSIS — L89323 Pressure ulcer of left buttock, stage 3: Secondary | ICD-10-CM | POA: Diagnosis not present

## 2023-03-02 DIAGNOSIS — G9341 Metabolic encephalopathy: Secondary | ICD-10-CM | POA: Diagnosis not present

## 2023-03-02 DIAGNOSIS — D631 Anemia in chronic kidney disease: Secondary | ICD-10-CM | POA: Diagnosis not present

## 2023-03-02 DIAGNOSIS — L89893 Pressure ulcer of other site, stage 3: Secondary | ICD-10-CM | POA: Diagnosis not present

## 2023-03-02 DIAGNOSIS — S81811D Laceration without foreign body, right lower leg, subsequent encounter: Secondary | ICD-10-CM | POA: Diagnosis not present

## 2023-03-02 DIAGNOSIS — N3946 Mixed incontinence: Secondary | ICD-10-CM | POA: Diagnosis not present

## 2023-03-02 DIAGNOSIS — M6281 Muscle weakness (generalized): Secondary | ICD-10-CM | POA: Diagnosis not present

## 2023-03-02 DIAGNOSIS — L89313 Pressure ulcer of right buttock, stage 3: Secondary | ICD-10-CM | POA: Diagnosis not present

## 2023-03-02 DIAGNOSIS — R627 Adult failure to thrive: Secondary | ICD-10-CM | POA: Diagnosis not present

## 2023-03-10 DIAGNOSIS — R627 Adult failure to thrive: Secondary | ICD-10-CM | POA: Diagnosis not present

## 2023-03-10 DIAGNOSIS — L89313 Pressure ulcer of right buttock, stage 3: Secondary | ICD-10-CM | POA: Diagnosis not present

## 2023-03-10 DIAGNOSIS — F411 Generalized anxiety disorder: Secondary | ICD-10-CM | POA: Diagnosis not present

## 2023-03-10 DIAGNOSIS — M6281 Muscle weakness (generalized): Secondary | ICD-10-CM | POA: Diagnosis not present

## 2023-03-10 DIAGNOSIS — E46 Unspecified protein-calorie malnutrition: Secondary | ICD-10-CM | POA: Diagnosis not present

## 2023-03-10 DIAGNOSIS — L89323 Pressure ulcer of left buttock, stage 3: Secondary | ICD-10-CM | POA: Diagnosis not present

## 2023-03-10 DIAGNOSIS — M109 Gout, unspecified: Secondary | ICD-10-CM | POA: Diagnosis not present

## 2023-03-10 DIAGNOSIS — D631 Anemia in chronic kidney disease: Secondary | ICD-10-CM | POA: Diagnosis not present

## 2023-03-10 DIAGNOSIS — E43 Unspecified severe protein-calorie malnutrition: Secondary | ICD-10-CM | POA: Diagnosis not present

## 2023-03-10 DIAGNOSIS — G9341 Metabolic encephalopathy: Secondary | ICD-10-CM | POA: Diagnosis not present

## 2023-03-10 DIAGNOSIS — K219 Gastro-esophageal reflux disease without esophagitis: Secondary | ICD-10-CM | POA: Diagnosis not present

## 2023-03-10 DIAGNOSIS — L89893 Pressure ulcer of other site, stage 3: Secondary | ICD-10-CM | POA: Diagnosis not present

## 2023-03-10 DIAGNOSIS — N3946 Mixed incontinence: Secondary | ICD-10-CM | POA: Diagnosis not present

## 2023-03-10 DIAGNOSIS — S81811D Laceration without foreign body, right lower leg, subsequent encounter: Secondary | ICD-10-CM | POA: Diagnosis not present

## 2023-03-14 ENCOUNTER — Telehealth: Payer: Self-pay

## 2023-03-14 DIAGNOSIS — R627 Adult failure to thrive: Secondary | ICD-10-CM | POA: Diagnosis not present

## 2023-03-14 DIAGNOSIS — E43 Unspecified severe protein-calorie malnutrition: Secondary | ICD-10-CM | POA: Diagnosis not present

## 2023-03-14 DIAGNOSIS — I10 Essential (primary) hypertension: Secondary | ICD-10-CM | POA: Diagnosis not present

## 2023-03-14 DIAGNOSIS — K219 Gastro-esophageal reflux disease without esophagitis: Secondary | ICD-10-CM | POA: Diagnosis not present

## 2023-03-14 DIAGNOSIS — M109 Gout, unspecified: Secondary | ICD-10-CM | POA: Diagnosis not present

## 2023-03-14 DIAGNOSIS — F411 Generalized anxiety disorder: Secondary | ICD-10-CM | POA: Diagnosis not present

## 2023-03-14 NOTE — Telephone Encounter (Signed)
Transition Care Management Unsuccessful Follow-up Telephone Call  Date of discharge and from where:  [name] 11/1  Attempts:  2nd Attempt  Reason for unsuccessful TCM follow-up call:  No answer/busy   Lenard Forth [place]  [facility], [facility] Guide, Phone: [phone] Website: [email or web address]

## 2023-03-14 NOTE — Telephone Encounter (Signed)
Transition Care Management Unsuccessful Follow-up Telephone Call  Date of discharge and from where:  [name]  Attempts:  1st Attempt  Reason for unsuccessful TCM follow-up call:  No answer/busy   [name]  [facility], [facility] Guide, Phone: [phone] Website: [email or web address]

## 2023-03-15 DIAGNOSIS — F411 Generalized anxiety disorder: Secondary | ICD-10-CM | POA: Diagnosis not present

## 2023-03-15 DIAGNOSIS — F330 Major depressive disorder, recurrent, mild: Secondary | ICD-10-CM | POA: Diagnosis not present

## 2023-03-15 DIAGNOSIS — F339 Major depressive disorder, recurrent, unspecified: Secondary | ICD-10-CM | POA: Diagnosis not present

## 2023-03-15 DIAGNOSIS — F4320 Adjustment disorder, unspecified: Secondary | ICD-10-CM | POA: Diagnosis not present

## 2023-03-17 DIAGNOSIS — M6281 Muscle weakness (generalized): Secondary | ICD-10-CM | POA: Diagnosis not present

## 2023-03-17 DIAGNOSIS — M109 Gout, unspecified: Secondary | ICD-10-CM | POA: Diagnosis not present

## 2023-03-17 DIAGNOSIS — G9341 Metabolic encephalopathy: Secondary | ICD-10-CM | POA: Diagnosis not present

## 2023-03-17 DIAGNOSIS — D631 Anemia in chronic kidney disease: Secondary | ICD-10-CM | POA: Diagnosis not present

## 2023-03-17 DIAGNOSIS — N3946 Mixed incontinence: Secondary | ICD-10-CM | POA: Diagnosis not present

## 2023-03-17 DIAGNOSIS — M545 Low back pain, unspecified: Secondary | ICD-10-CM | POA: Diagnosis not present

## 2023-03-17 DIAGNOSIS — R627 Adult failure to thrive: Secondary | ICD-10-CM | POA: Diagnosis not present

## 2023-03-17 DIAGNOSIS — E46 Unspecified protein-calorie malnutrition: Secondary | ICD-10-CM | POA: Diagnosis not present

## 2023-03-17 DIAGNOSIS — T85598A Other mechanical complication of other gastrointestinal prosthetic devices, implants and grafts, initial encounter: Secondary | ICD-10-CM | POA: Diagnosis not present

## 2023-03-17 DIAGNOSIS — S81811D Laceration without foreign body, right lower leg, subsequent encounter: Secondary | ICD-10-CM | POA: Diagnosis not present

## 2023-03-17 DIAGNOSIS — R5381 Other malaise: Secondary | ICD-10-CM | POA: Diagnosis not present

## 2023-03-21 ENCOUNTER — Inpatient Hospital Stay (HOSPITAL_COMMUNITY)
Admission: EM | Admit: 2023-03-21 | Discharge: 2023-04-06 | DRG: 326 | Disposition: A | Discharge status: Skilled Nursing Facility | Payer: Medicare HMO | Source: Skilled Nursing Facility | Attending: Internal Medicine | Admitting: Internal Medicine

## 2023-03-21 ENCOUNTER — Encounter (HOSPITAL_COMMUNITY): Payer: Self-pay | Admitting: Emergency Medicine

## 2023-03-21 ENCOUNTER — Inpatient Hospital Stay (HOSPITAL_COMMUNITY): Payer: Medicare HMO | Admitting: Anesthesiology

## 2023-03-21 ENCOUNTER — Inpatient Hospital Stay (HOSPITAL_COMMUNITY): Payer: Medicare HMO

## 2023-03-21 ENCOUNTER — Emergency Department (HOSPITAL_COMMUNITY): Payer: Medicare HMO

## 2023-03-21 ENCOUNTER — Encounter (HOSPITAL_COMMUNITY)
Admission: EM | Disposition: A | Discharge status: Skilled Nursing Facility | Payer: Self-pay | Source: Skilled Nursing Facility | Attending: Internal Medicine

## 2023-03-21 ENCOUNTER — Other Ambulatory Visit: Payer: Self-pay

## 2023-03-21 DIAGNOSIS — K4090 Unilateral inguinal hernia, without obstruction or gangrene, not specified as recurrent: Secondary | ICD-10-CM | POA: Diagnosis not present

## 2023-03-21 DIAGNOSIS — Z66 Do not resuscitate: Secondary | ICD-10-CM | POA: Diagnosis present

## 2023-03-21 DIAGNOSIS — Z9049 Acquired absence of other specified parts of digestive tract: Secondary | ICD-10-CM

## 2023-03-21 DIAGNOSIS — M6281 Muscle weakness (generalized): Secondary | ICD-10-CM | POA: Diagnosis not present

## 2023-03-21 DIAGNOSIS — N390 Urinary tract infection, site not specified: Secondary | ICD-10-CM | POA: Diagnosis not present

## 2023-03-21 DIAGNOSIS — K9420 Gastrostomy complication, unspecified: Principal | ICD-10-CM

## 2023-03-21 DIAGNOSIS — R000 Tachycardia, unspecified: Secondary | ICD-10-CM | POA: Diagnosis present

## 2023-03-21 DIAGNOSIS — M150 Primary generalized (osteo)arthritis: Secondary | ICD-10-CM | POA: Diagnosis not present

## 2023-03-21 DIAGNOSIS — N179 Acute kidney failure, unspecified: Secondary | ICD-10-CM | POA: Diagnosis not present

## 2023-03-21 DIAGNOSIS — D649 Anemia, unspecified: Secondary | ICD-10-CM | POA: Diagnosis present

## 2023-03-21 DIAGNOSIS — D638 Anemia in other chronic diseases classified elsewhere: Secondary | ICD-10-CM | POA: Diagnosis not present

## 2023-03-21 DIAGNOSIS — K668 Other specified disorders of peritoneum: Secondary | ICD-10-CM

## 2023-03-21 DIAGNOSIS — Z8249 Family history of ischemic heart disease and other diseases of the circulatory system: Secondary | ICD-10-CM | POA: Diagnosis not present

## 2023-03-21 DIAGNOSIS — I959 Hypotension, unspecified: Secondary | ICD-10-CM | POA: Diagnosis present

## 2023-03-21 DIAGNOSIS — R1084 Generalized abdominal pain: Secondary | ICD-10-CM | POA: Diagnosis not present

## 2023-03-21 DIAGNOSIS — Z8261 Family history of arthritis: Secondary | ICD-10-CM

## 2023-03-21 DIAGNOSIS — I9589 Other hypotension: Secondary | ICD-10-CM | POA: Diagnosis present

## 2023-03-21 DIAGNOSIS — E162 Hypoglycemia, unspecified: Secondary | ICD-10-CM | POA: Diagnosis not present

## 2023-03-21 DIAGNOSIS — K219 Gastro-esophageal reflux disease without esophagitis: Secondary | ICD-10-CM | POA: Diagnosis not present

## 2023-03-21 DIAGNOSIS — Z1624 Resistance to multiple antibiotics: Secondary | ICD-10-CM | POA: Diagnosis not present

## 2023-03-21 DIAGNOSIS — E876 Hypokalemia: Secondary | ICD-10-CM | POA: Diagnosis present

## 2023-03-21 DIAGNOSIS — B9620 Unspecified Escherichia coli [E. coli] as the cause of diseases classified elsewhere: Secondary | ICD-10-CM | POA: Diagnosis present

## 2023-03-21 DIAGNOSIS — Z741 Need for assistance with personal care: Secondary | ICD-10-CM | POA: Diagnosis not present

## 2023-03-21 DIAGNOSIS — Z9889 Other specified postprocedural states: Secondary | ICD-10-CM | POA: Diagnosis not present

## 2023-03-21 DIAGNOSIS — R9431 Abnormal electrocardiogram [ECG] [EKG]: Secondary | ICD-10-CM | POA: Diagnosis present

## 2023-03-21 DIAGNOSIS — K631 Perforation of intestine (nontraumatic): Secondary | ICD-10-CM | POA: Diagnosis present

## 2023-03-21 DIAGNOSIS — R0689 Other abnormalities of breathing: Secondary | ICD-10-CM | POA: Diagnosis not present

## 2023-03-21 DIAGNOSIS — D509 Iron deficiency anemia, unspecified: Secondary | ICD-10-CM | POA: Diagnosis not present

## 2023-03-21 DIAGNOSIS — Z87891 Personal history of nicotine dependence: Secondary | ICD-10-CM

## 2023-03-21 DIAGNOSIS — K285 Chronic or unspecified gastrojejunal ulcer with perforation: Secondary | ICD-10-CM | POA: Diagnosis not present

## 2023-03-21 DIAGNOSIS — Z4889 Encounter for other specified surgical aftercare: Secondary | ICD-10-CM | POA: Diagnosis not present

## 2023-03-21 DIAGNOSIS — Z931 Gastrostomy status: Secondary | ICD-10-CM

## 2023-03-21 DIAGNOSIS — R5381 Other malaise: Secondary | ICD-10-CM | POA: Diagnosis present

## 2023-03-21 DIAGNOSIS — R1012 Left upper quadrant pain: Secondary | ICD-10-CM | POA: Diagnosis not present

## 2023-03-21 DIAGNOSIS — Z825 Family history of asthma and other chronic lower respiratory diseases: Secondary | ICD-10-CM

## 2023-03-21 DIAGNOSIS — E870 Hyperosmolality and hypernatremia: Secondary | ICD-10-CM | POA: Diagnosis present

## 2023-03-21 DIAGNOSIS — R627 Adult failure to thrive: Secondary | ICD-10-CM | POA: Diagnosis present

## 2023-03-21 DIAGNOSIS — Z882 Allergy status to sulfonamides status: Secondary | ICD-10-CM

## 2023-03-21 DIAGNOSIS — Z9884 Bariatric surgery status: Secondary | ICD-10-CM

## 2023-03-21 DIAGNOSIS — G4733 Obstructive sleep apnea (adult) (pediatric): Secondary | ICD-10-CM | POA: Diagnosis not present

## 2023-03-21 DIAGNOSIS — I2510 Atherosclerotic heart disease of native coronary artery without angina pectoris: Secondary | ICD-10-CM | POA: Diagnosis not present

## 2023-03-21 DIAGNOSIS — Z833 Family history of diabetes mellitus: Secondary | ICD-10-CM

## 2023-03-21 DIAGNOSIS — K9423 Gastrostomy malfunction: Secondary | ICD-10-CM | POA: Diagnosis not present

## 2023-03-21 DIAGNOSIS — R7303 Prediabetes: Secondary | ICD-10-CM | POA: Diagnosis present

## 2023-03-21 DIAGNOSIS — K281 Acute gastrojejunal ulcer with perforation: Secondary | ICD-10-CM

## 2023-03-21 DIAGNOSIS — K6389 Other specified diseases of intestine: Secondary | ICD-10-CM | POA: Diagnosis not present

## 2023-03-21 DIAGNOSIS — Z7401 Bed confinement status: Secondary | ICD-10-CM | POA: Diagnosis not present

## 2023-03-21 DIAGNOSIS — Z90711 Acquired absence of uterus with remaining cervical stump: Secondary | ICD-10-CM

## 2023-03-21 DIAGNOSIS — G9341 Metabolic encephalopathy: Secondary | ICD-10-CM | POA: Diagnosis not present

## 2023-03-21 DIAGNOSIS — F330 Major depressive disorder, recurrent, mild: Secondary | ICD-10-CM | POA: Diagnosis not present

## 2023-03-21 DIAGNOSIS — E871 Hypo-osmolality and hyponatremia: Secondary | ICD-10-CM | POA: Diagnosis present

## 2023-03-21 DIAGNOSIS — E872 Acidosis, unspecified: Secondary | ICD-10-CM | POA: Diagnosis not present

## 2023-03-21 DIAGNOSIS — E785 Hyperlipidemia, unspecified: Secondary | ICD-10-CM | POA: Diagnosis not present

## 2023-03-21 DIAGNOSIS — Z743 Need for continuous supervision: Secondary | ICD-10-CM | POA: Diagnosis not present

## 2023-03-21 DIAGNOSIS — I10 Essential (primary) hypertension: Secondary | ICD-10-CM | POA: Diagnosis present

## 2023-03-21 DIAGNOSIS — F411 Generalized anxiety disorder: Secondary | ICD-10-CM | POA: Diagnosis present

## 2023-03-21 DIAGNOSIS — R069 Unspecified abnormalities of breathing: Secondary | ICD-10-CM | POA: Diagnosis not present

## 2023-03-21 DIAGNOSIS — R55 Syncope and collapse: Secondary | ICD-10-CM | POA: Diagnosis not present

## 2023-03-21 DIAGNOSIS — F419 Anxiety disorder, unspecified: Secondary | ICD-10-CM | POA: Diagnosis not present

## 2023-03-21 DIAGNOSIS — K9429 Other complications of gastrostomy: Secondary | ICD-10-CM | POA: Diagnosis not present

## 2023-03-21 DIAGNOSIS — Z888 Allergy status to other drugs, medicaments and biological substances status: Secondary | ICD-10-CM

## 2023-03-21 DIAGNOSIS — E860 Dehydration: Secondary | ICD-10-CM | POA: Diagnosis present

## 2023-03-21 DIAGNOSIS — K2980 Duodenitis without bleeding: Secondary | ICD-10-CM | POA: Diagnosis not present

## 2023-03-21 DIAGNOSIS — Z6841 Body Mass Index (BMI) 40.0 and over, adult: Secondary | ICD-10-CM

## 2023-03-21 DIAGNOSIS — Z431 Encounter for attention to gastrostomy: Secondary | ICD-10-CM | POA: Diagnosis not present

## 2023-03-21 DIAGNOSIS — M6259 Muscle wasting and atrophy, not elsewhere classified, multiple sites: Secondary | ICD-10-CM | POA: Diagnosis not present

## 2023-03-21 DIAGNOSIS — Z781 Physical restraint status: Secondary | ICD-10-CM

## 2023-03-21 DIAGNOSIS — E861 Hypovolemia: Secondary | ICD-10-CM | POA: Diagnosis not present

## 2023-03-21 DIAGNOSIS — Z860100 Personal history of colon polyps, unspecified: Secondary | ICD-10-CM

## 2023-03-21 DIAGNOSIS — K275 Chronic or unspecified peptic ulcer, site unspecified, with perforation: Secondary | ICD-10-CM | POA: Diagnosis not present

## 2023-03-21 DIAGNOSIS — Z813 Family history of other psychoactive substance abuse and dependence: Secondary | ICD-10-CM

## 2023-03-21 DIAGNOSIS — K251 Acute gastric ulcer with perforation: Secondary | ICD-10-CM | POA: Diagnosis not present

## 2023-03-21 DIAGNOSIS — R1314 Dysphagia, pharyngoesophageal phase: Secondary | ICD-10-CM | POA: Diagnosis not present

## 2023-03-21 DIAGNOSIS — Z79899 Other long term (current) drug therapy: Secondary | ICD-10-CM

## 2023-03-21 DIAGNOSIS — Z811 Family history of alcohol abuse and dependence: Secondary | ICD-10-CM

## 2023-03-21 DIAGNOSIS — R0602 Shortness of breath: Secondary | ICD-10-CM | POA: Diagnosis not present

## 2023-03-21 DIAGNOSIS — R531 Weakness: Secondary | ICD-10-CM | POA: Diagnosis not present

## 2023-03-21 DIAGNOSIS — I878 Other specified disorders of veins: Secondary | ICD-10-CM | POA: Diagnosis present

## 2023-03-21 HISTORY — PX: IR GASTROSTOMY TUBE REMOVAL: IMG5492

## 2023-03-21 HISTORY — PX: LAPAROTOMY: SHX154

## 2023-03-21 LAB — CBC WITH DIFFERENTIAL/PLATELET
Abs Immature Granulocytes: 0.02 10*3/uL (ref 0.00–0.07)
Basophils Absolute: 0 10*3/uL (ref 0.0–0.1)
Basophils Relative: 0 %
Eosinophils Absolute: 0 10*3/uL (ref 0.0–0.5)
Eosinophils Relative: 0 %
HCT: 36 % (ref 36.0–46.0)
Hemoglobin: 11.1 g/dL — ABNORMAL LOW (ref 12.0–15.0)
Immature Granulocytes: 0 %
Lymphocytes Relative: 14 %
Lymphs Abs: 0.9 10*3/uL (ref 0.7–4.0)
MCH: 29.6 pg (ref 26.0–34.0)
MCHC: 30.8 g/dL (ref 30.0–36.0)
MCV: 96 fL (ref 80.0–100.0)
Monocytes Absolute: 0.5 10*3/uL (ref 0.1–1.0)
Monocytes Relative: 7 %
Neutro Abs: 5.5 10*3/uL (ref 1.7–7.7)
Neutrophils Relative %: 79 %
Platelets: 312 10*3/uL (ref 150–400)
RBC: 3.75 MIL/uL — ABNORMAL LOW (ref 3.87–5.11)
RDW: 17.9 % — ABNORMAL HIGH (ref 11.5–15.5)
WBC: 6.9 10*3/uL (ref 4.0–10.5)
nRBC: 0 % (ref 0.0–0.2)

## 2023-03-21 LAB — LACTIC ACID, PLASMA
Lactic Acid, Venous: 1.9 mmol/L (ref 0.5–1.9)
Lactic Acid, Venous: 1.5 mmol/L (ref 0.5–1.9)

## 2023-03-21 LAB — LIPASE, BLOOD: Lipase: 55 U/L — ABNORMAL HIGH (ref 11–51)

## 2023-03-21 LAB — COMPREHENSIVE METABOLIC PANEL
ALT: 20 U/L (ref 0–44)
AST: 33 U/L (ref 15–41)
Albumin: 2.7 g/dL — ABNORMAL LOW (ref 3.5–5.0)
Alkaline Phosphatase: 108 U/L (ref 38–126)
Anion gap: 10 (ref 5–15)
BUN: 29 mg/dL — ABNORMAL HIGH (ref 8–23)
CO2: 20 mmol/L — ABNORMAL LOW (ref 22–32)
Calcium: 8.9 mg/dL (ref 8.9–10.3)
Chloride: 116 mmol/L — ABNORMAL HIGH (ref 98–111)
Creatinine, Ser: 1.08 mg/dL — ABNORMAL HIGH (ref 0.44–1.00)
GFR, Estimated: 55 mL/min — ABNORMAL LOW (ref >60)
Glucose, Bld: 90 mg/dL (ref 70–99)
Potassium: 3.9 mmol/L (ref 3.5–5.1)
Sodium: 146 mmol/L — ABNORMAL HIGH (ref 135–145)
Total Bilirubin: 0.8 mg/dL (ref <1.2)
Total Protein: 6.9 g/dL (ref 6.5–8.1)

## 2023-03-21 LAB — MRSA NEXT GEN BY PCR, NASAL: MRSA by PCR Next Gen: NOT DETECTED

## 2023-03-21 SURGERY — LAPAROTOMY, EXPLORATORY
Anesthesia: General

## 2023-03-21 MED ORDER — LACTATED RINGERS IV BOLUS
500.0000 mL | Freq: Once | INTRAVENOUS | Status: AC
  Administered 2023-03-21: 500 mL via INTRAVENOUS

## 2023-03-21 MED ORDER — OXYCODONE HCL 5 MG/5ML PO SOLN: 5.0000 mg | Freq: Once | ORAL | Status: DC | PRN

## 2023-03-21 MED ORDER — SODIUM CHLORIDE 0.9 % IV BOLUS
1000.0000 mL | Freq: Once | INTRAVENOUS | Status: AC
  Administered 2023-03-21: 1000 mL via INTRAVENOUS

## 2023-03-21 MED ORDER — HYDROMORPHONE HCL 1 MG/ML IJ SOLN
0.2500 mg | INTRAMUSCULAR | Status: DC | PRN
  Administered 2023-03-21 (×2): 0.5 mg via INTRAVENOUS

## 2023-03-21 MED ORDER — ONDANSETRON HCL 4 MG/2ML IJ SOLN
INTRAMUSCULAR | Status: AC
  Filled 2023-03-21: qty 2

## 2023-03-21 MED ORDER — ALBUMIN HUMAN 5 % IV SOLN: INTRAVENOUS | Status: DC | PRN

## 2023-03-21 MED ORDER — HYDROMORPHONE HCL 1 MG/ML IJ SOLN
INTRAMUSCULAR | Status: AC
  Filled 2023-03-21: qty 1

## 2023-03-21 MED ORDER — HYDROMORPHONE HCL 2 MG/ML IJ SOLN
INTRAMUSCULAR | Status: AC
  Filled 2023-03-21: qty 1

## 2023-03-21 MED ORDER — OXYCODONE HCL 5 MG PO TABS: 5.0000 mg | ORAL_TABLET | Freq: Once | ORAL | Status: DC | PRN

## 2023-03-21 MED ORDER — PHENYLEPHRINE HCL (PRESSORS) 10 MG/ML IV SOLN
INTRAVENOUS | Status: DC | PRN
  Administered 2023-03-21 (×3): 200 ug via INTRAVENOUS

## 2023-03-21 MED ORDER — MORPHINE SULFATE (PF) 4 MG/ML IV SOLN
4.0000 mg | Freq: Once | INTRAVENOUS | Status: AC
  Administered 2023-03-21: 4 mg via INTRAVENOUS
  Filled 2023-03-21: qty 1

## 2023-03-21 MED ORDER — ORAL CARE MOUTH RINSE: 15.0000 mL | OROMUCOSAL | Status: DC | PRN

## 2023-03-21 MED ORDER — HYDROMORPHONE HCL 1 MG/ML IJ SOLN
INTRAMUSCULAR | Status: DC | PRN
  Administered 2023-03-21 (×3): .4 mg via INTRAVENOUS

## 2023-03-21 MED ORDER — MORPHINE SULFATE (PF) 2 MG/ML IV SOLN: 1.0000 mg | INTRAVENOUS | Status: DC | PRN

## 2023-03-21 MED ORDER — IOHEXOL 300 MG/ML  SOLN
50.0000 mL | Freq: Once | INTRAMUSCULAR | Status: AC | PRN
  Administered 2023-03-21: 25 mL

## 2023-03-21 MED ORDER — DEXAMETHASONE SODIUM PHOSPHATE 10 MG/ML IJ SOLN
INTRAMUSCULAR | Status: AC
  Filled 2023-03-21: qty 1

## 2023-03-21 MED ORDER — MORPHINE SULFATE (PF) 2 MG/ML IV SOLN
2.0000 mg | INTRAVENOUS | Status: DC | PRN
  Administered 2023-03-23: 2 mg via INTRAVENOUS
  Filled 2023-03-21: qty 1

## 2023-03-21 MED ORDER — PANTOPRAZOLE SODIUM 40 MG IV SOLR: 40.0000 mg | INTRAVENOUS | Status: DC

## 2023-03-21 MED ORDER — 0.9 % SODIUM CHLORIDE (POUR BTL) OPTIME
TOPICAL | Status: DC | PRN
Start: 2023-03-21 — End: 2023-03-21
  Administered 2023-03-21 (×3): 1000 mL

## 2023-03-21 MED ORDER — IOHEXOL 300 MG/ML  SOLN
60.0000 mL | Freq: Once | INTRAMUSCULAR | Status: AC | PRN
  Administered 2023-03-21: 60 mL via ORAL

## 2023-03-21 MED ORDER — MIDAZOLAM HCL 5 MG/5ML IJ SOLN
INTRAMUSCULAR | Status: DC | PRN
  Administered 2023-03-21: 2 mg via INTRAVENOUS

## 2023-03-21 MED ORDER — MIDAZOLAM HCL 2 MG/2ML IJ SOLN
INTRAMUSCULAR | Status: AC
  Filled 2023-03-21: qty 2

## 2023-03-21 MED ORDER — PANTOPRAZOLE SODIUM 40 MG IV SOLR
40.0000 mg | Freq: Two times a day (BID) | INTRAVENOUS | Status: DC
  Administered 2023-03-25 – 2023-03-28 (×7): 40 mg via INTRAVENOUS
  Filled 2023-03-21 (×7): qty 10

## 2023-03-21 MED ORDER — LIDOCAINE VISCOUS HCL 2 % MT SOLN
15.0000 mL | Freq: Once | OROMUCOSAL | Status: AC
  Administered 2023-03-21: 8 mL via TOPICAL

## 2023-03-21 MED ORDER — ESMOLOL HCL 100 MG/10ML IV SOLN
INTRAVENOUS | Status: AC
  Filled 2023-03-21: qty 10

## 2023-03-21 MED ORDER — SUCCINYLCHOLINE CHLORIDE 200 MG/10ML IV SOSY
PREFILLED_SYRINGE | INTRAVENOUS | Status: AC
  Filled 2023-03-21: qty 10

## 2023-03-21 MED ORDER — ORAL CARE MOUTH RINSE
15.0000 mL | OROMUCOSAL | Status: DC
  Administered 2023-03-21 – 2023-04-02 (×43): 15 mL via OROMUCOSAL

## 2023-03-21 MED ORDER — IOHEXOL 300 MG/ML  SOLN
100.0000 mL | Freq: Once | INTRAMUSCULAR | Status: AC | PRN
  Administered 2023-03-21: 100 mL via INTRAVENOUS

## 2023-03-21 MED ORDER — ONDANSETRON HCL 4 MG PO TABS: 4.0000 mg | ORAL_TABLET | Freq: Four times a day (QID) | ORAL | Status: DC | PRN

## 2023-03-21 MED ORDER — PIPERACILLIN-TAZOBACTAM 3.375 G IVPB
3.3750 g | Freq: Three times a day (TID) | INTRAVENOUS | Status: DC
  Administered 2023-03-21 – 2023-03-28 (×20): 3.375 g via INTRAVENOUS
  Filled 2023-03-21 (×21): qty 50

## 2023-03-21 MED ORDER — SODIUM CHLORIDE 0.9 % IV BOLUS
500.0000 mL | Freq: Once | INTRAVENOUS | Status: AC
  Administered 2023-03-21: 500 mL via INTRAVENOUS

## 2023-03-21 MED ORDER — ONDANSETRON HCL 4 MG/2ML IJ SOLN
4.0000 mg | Freq: Once | INTRAMUSCULAR | Status: AC
  Administered 2023-03-21: 4 mg via INTRAVENOUS
  Filled 2023-03-21: qty 2

## 2023-03-21 MED ORDER — LIDOCAINE HCL (PF) 2 % IJ SOLN
INTRAMUSCULAR | Status: AC
  Filled 2023-03-21: qty 5

## 2023-03-21 MED ORDER — LIDOCAINE HCL (CARDIAC) PF 100 MG/5ML IV SOSY
PREFILLED_SYRINGE | INTRAVENOUS | Status: DC | PRN
  Administered 2023-03-21: 100 mg via INTRAVENOUS

## 2023-03-21 MED ORDER — FENTANYL CITRATE (PF) 100 MCG/2ML IJ SOLN
INTRAMUSCULAR | Status: AC
  Filled 2023-03-21: qty 2

## 2023-03-21 MED ORDER — SUCCINYLCHOLINE CHLORIDE 200 MG/10ML IV SOSY
PREFILLED_SYRINGE | INTRAVENOUS | Status: DC | PRN
  Administered 2023-03-21: 120 mg via INTRAVENOUS

## 2023-03-21 MED ORDER — ALBUTEROL SULFATE (2.5 MG/3ML) 0.083% IN NEBU: 2.5000 mg | INHALATION_SOLUTION | RESPIRATORY_TRACT | Status: DC | PRN

## 2023-03-21 MED ORDER — ROCURONIUM BROMIDE 10 MG/ML (PF) SYRINGE
PREFILLED_SYRINGE | INTRAVENOUS | Status: AC
  Filled 2023-03-21: qty 10

## 2023-03-21 MED ORDER — ENOXAPARIN SODIUM 40 MG/0.4ML IJ SOSY
40.0000 mg | PREFILLED_SYRINGE | INTRAMUSCULAR | Status: DC
  Administered 2023-03-22 – 2023-03-23 (×2): 40 mg via SUBCUTANEOUS
  Filled 2023-03-21 (×2): qty 0.4

## 2023-03-21 MED ORDER — ALBUMIN HUMAN 5 % IV SOLN
INTRAVENOUS | Status: AC
  Filled 2023-03-21: qty 500

## 2023-03-21 MED ORDER — ONDANSETRON HCL 4 MG/2ML IJ SOLN
INTRAMUSCULAR | Status: DC | PRN
  Administered 2023-03-21: 4 mg via INTRAVENOUS

## 2023-03-21 MED ORDER — SUGAMMADEX SODIUM 200 MG/2ML IV SOLN
INTRAVENOUS | Status: DC | PRN
  Administered 2023-03-21 (×2): 200 mg via INTRAVENOUS

## 2023-03-21 MED ORDER — KETAMINE HCL 10 MG/ML IJ SOLN
INTRAMUSCULAR | Status: DC | PRN
  Administered 2023-03-21: 50 mg via INTRAVENOUS

## 2023-03-21 MED ORDER — CHLORHEXIDINE GLUCONATE CLOTH 2 % EX PADS: 6.0000 | MEDICATED_PAD | Freq: Every day | CUTANEOUS | Status: DC

## 2023-03-21 MED ORDER — ACETAMINOPHEN 325 MG PO TABS: 650.0000 mg | ORAL_TABLET | Freq: Four times a day (QID) | ORAL | Status: DC | PRN

## 2023-03-21 MED ORDER — PANTOPRAZOLE SODIUM 40 MG IV SOLR
40.0000 mg | Freq: Three times a day (TID) | INTRAVENOUS | Status: AC
  Administered 2023-03-22 – 2023-03-24 (×9): 40 mg via INTRAVENOUS
  Filled 2023-03-21 (×9): qty 10

## 2023-03-21 MED ORDER — SODIUM CHLORIDE 0.9 % IV SOLN
INTRAVENOUS | Status: AC
Start: 2023-03-21 — End: 2023-03-22

## 2023-03-21 MED ORDER — FENTANYL CITRATE (PF) 100 MCG/2ML IJ SOLN
INTRAMUSCULAR | Status: DC | PRN
  Administered 2023-03-21: 100 ug via INTRAVENOUS

## 2023-03-21 MED ORDER — ACETAMINOPHEN 10 MG/ML IV SOLN
1000.0000 mg | Freq: Three times a day (TID) | INTRAVENOUS | Status: AC
  Administered 2023-03-21 – 2023-03-22 (×3): 1000 mg via INTRAVENOUS
  Filled 2023-03-21 (×3): qty 100

## 2023-03-21 MED ORDER — ROCURONIUM BROMIDE 100 MG/10ML IV SOLN
INTRAVENOUS | Status: DC | PRN
  Administered 2023-03-21: 40 mg via INTRAVENOUS

## 2023-03-21 MED ORDER — LIDOCAINE VISCOUS HCL 2 % MT SOLN
OROMUCOSAL | Status: AC
Start: 2023-03-21 — End: ?
  Filled 2023-03-21: qty 15

## 2023-03-21 MED ORDER — PHENYLEPHRINE 80 MCG/ML (10ML) SYRINGE FOR IV PUSH (FOR BLOOD PRESSURE SUPPORT)
PREFILLED_SYRINGE | INTRAVENOUS | Status: AC
  Filled 2023-03-21: qty 10

## 2023-03-21 MED ORDER — PANTOPRAZOLE SODIUM 40 MG IV SOLR: 40.0000 mg | Freq: Two times a day (BID) | INTRAVENOUS | Status: DC

## 2023-03-21 MED ORDER — PROPOFOL 10 MG/ML IV BOLUS
INTRAVENOUS | Status: AC
  Filled 2023-03-21: qty 20

## 2023-03-21 MED ORDER — PANTOPRAZOLE SODIUM 40 MG IV SOLR
40.0000 mg | INTRAVENOUS | Status: AC
  Administered 2023-03-21 (×2): 40 mg via INTRAVENOUS
  Filled 2023-03-21 (×2): qty 10

## 2023-03-21 MED ORDER — ACETAMINOPHEN 650 MG RE SUPP: 650.0000 mg | Freq: Four times a day (QID) | RECTAL | Status: DC | PRN

## 2023-03-21 MED ORDER — ESMOLOL HCL 100 MG/10ML IV SOLN
INTRAVENOUS | Status: DC | PRN
  Administered 2023-03-21: 15 mg via INTRAVENOUS
  Administered 2023-03-21 (×2): 10 mg via INTRAVENOUS

## 2023-03-21 MED ORDER — IOHEXOL 300 MG/ML  SOLN
50.0000 mL | Freq: Once | INTRAMUSCULAR | Status: AC | PRN
  Administered 2023-03-21: 50 mL

## 2023-03-21 MED ORDER — ONDANSETRON HCL 4 MG/2ML IJ SOLN: 4.0000 mg | Freq: Once | INTRAMUSCULAR | Status: DC | PRN

## 2023-03-21 MED ORDER — KETAMINE HCL 50 MG/5ML IJ SOSY
PREFILLED_SYRINGE | INTRAMUSCULAR | Status: AC
  Filled 2023-03-21: qty 5

## 2023-03-21 MED ORDER — PANTOPRAZOLE SODIUM 40 MG IV SOLR
40.0000 mg | Freq: Two times a day (BID) | INTRAVENOUS | Status: DC
  Administered 2023-03-21: 40 mg via INTRAVENOUS
  Filled 2023-03-21: qty 10

## 2023-03-21 MED ORDER — PROPOFOL 10 MG/ML IV BOLUS
INTRAVENOUS | Status: DC | PRN
  Administered 2023-03-21: 80 mg via INTRAVENOUS

## 2023-03-21 MED ORDER — ONDANSETRON HCL 4 MG/2ML IJ SOLN
4.0000 mg | Freq: Four times a day (QID) | INTRAMUSCULAR | Status: DC | PRN
  Administered 2023-03-27: 4 mg via INTRAVENOUS
  Filled 2023-03-21: qty 2

## 2023-03-21 MED ORDER — PHENYLEPHRINE HCL-NACL 20-0.9 MG/250ML-% IV SOLN
INTRAVENOUS | Status: DC | PRN
  Administered 2023-03-21: 30 ug/min via INTRAVENOUS

## 2023-03-21 MED ORDER — LACTATED RINGERS IV SOLN: INTRAVENOUS | Status: DC | PRN

## 2023-03-21 MED ORDER — METHOCARBAMOL 1000 MG/10ML IJ SOLN
500.0000 mg | Freq: Three times a day (TID) | INTRAMUSCULAR | Status: DC
  Administered 2023-03-21 – 2023-03-23 (×6): 500 mg via INTRAVENOUS
  Filled 2023-03-21 (×6): qty 5

## 2023-03-21 SURGICAL SUPPLY — 34 items
BAG COUNTER SPONGE SURGICOUNT (BAG) IMPLANT
CHLORAPREP W/TINT 26 (MISCELLANEOUS) IMPLANT
COVER MAYO STAND STRL (DRAPES) ×1 IMPLANT
COVER SURGICAL LIGHT HANDLE (MISCELLANEOUS) ×1 IMPLANT
DRAIN CHANNEL 19F RND (DRAIN) IMPLANT
DRAPE LAPAROSCOPIC ABDOMINAL (DRAPES) ×1 IMPLANT
DRSG OPSITE POSTOP 4X10 (GAUZE/BANDAGES/DRESSINGS) IMPLANT
DRSG OPSITE POSTOP 4X6 (GAUZE/BANDAGES/DRESSINGS) IMPLANT
DRSG OPSITE POSTOP 4X8 (GAUZE/BANDAGES/DRESSINGS) IMPLANT
ELECT REM PT RETURN 15FT ADLT (MISCELLANEOUS) ×1 IMPLANT
EVACUATOR SILICONE 100CC (DRAIN) IMPLANT
GAUZE PAD ABD 8X10 STRL (GAUZE/BANDAGES/DRESSINGS) IMPLANT
GAUZE SPONGE 4X4 12PLY STRL (GAUZE/BANDAGES/DRESSINGS) IMPLANT
GLOVE BIO SURGEON STRL SZ 6 (GLOVE) ×2 IMPLANT
GLOVE BIOGEL PI MICRO STRL 5.5 (GLOVE) ×1 IMPLANT
GLOVE INDICATOR 6.5 STRL GRN (GLOVE) ×2 IMPLANT
GOWN STRL REUS W/ TWL LRG LVL3 (GOWN DISPOSABLE) ×1 IMPLANT
KIT TURNOVER KIT A (KITS) IMPLANT
PACK GENERAL/GYN (CUSTOM PROCEDURE TRAY) ×1 IMPLANT
STAPLER SKIN PROX WIDE 3.9 (STAPLE) IMPLANT
SUT ETHILON 2 0 PSLX (SUTURE) IMPLANT
SUT ETHILON 3 0 PS 1 (SUTURE) IMPLANT
SUT MNCRL AB 4-0 PS2 18 (SUTURE) IMPLANT
SUT NOVA T20/GS 25 (SUTURE) IMPLANT
SUT PDS AB 1 TP1 96 (SUTURE) IMPLANT
SUT SILK 2 0 SH CR/8 (SUTURE) ×1 IMPLANT
SUT SILK 2-0 18XBRD TIE 12 (SUTURE) ×1 IMPLANT
SUT SILK 3 0 SH CR/8 (SUTURE) ×1 IMPLANT
SUT SILK 3-0 18XBRD TIE 12 (SUTURE) ×1 IMPLANT
TAPE PAPER 3X10 WHT MICROPORE (GAUZE/BANDAGES/DRESSINGS) IMPLANT
TOWEL OR 17X26 10 PK STRL BLUE (TOWEL DISPOSABLE) IMPLANT
TOWEL OR NON WOVEN STRL DISP B (DISPOSABLE) ×1 IMPLANT
TRAY FOLEY MTR SLVR 14FR STAT (SET/KITS/TRAYS/PACK) ×1 IMPLANT
TRAY FOLEY MTR SLVR 16FR STAT (SET/KITS/TRAYS/PACK) ×1 IMPLANT

## 2023-03-21 NOTE — ED Notes (Signed)
ED TO INPATIENT HANDOFF REPORT  Name/Age/Gender Christine [name] 72 y.o. female  Code Status    Code Status Orders  (From admission, onward)           Start     Ordered   03/21/23 1316  Do not attempt resuscitation (DNR)- Limited -Do Not Intubate (DNI)  Continuous       Question Answer Comment  If pulseless and not breathing No CPR or chest compressions.   In Pre-Arrest Conditions (Patient Is Breathing and Has A Pulse) Do not intubate. Provide all appropriate non-invasive medical interventions. Avoid ICU transfer unless indicated or required.   Consent: Discussion documented in EHR or advanced directives reviewed      03/21/23 1317           Code Status History     Date Active Date Inactive Code Status Order ID Comments User Context   01/23/2023 1347 02/04/2023 2331 Limited: Do not attempt resuscitation (DNR) -DNR-LIMITED -Do Not Intubate/DNI  [id number]  Ernie Avena, NP Inpatient   01/08/2023 1456 01/23/2023 1346 Full Code [id number]  Cleora Fleet, MD ED   11/09/2022 0923 11/15/2022 2347 Full Code [id number]  Clydie Braun, MD ED   09/14/2022 1648 09/16/2022 1653 Full Code [id number]  Stechschulte, Hyman Hopes, MD Inpatient   05/06/2016 1334 05/07/2016 1519 Full Code [id number]  Manus Rudd, MD Inpatient   05/06/2016 1333 05/06/2016 1334 Full Code [id number]  Kinsinger, De Blanch, MD Inpatient      Advance Directive Documentation    Flowsheet Row Most Recent Value  Type of Advance Directive Out of facility DNR (pink MOST or yellow form)  Pre-existing out of facility DNR order (yellow form or pink MOST form) --  "MOST" Form in Place? --       Home/SNF/Other Skilled nursing facility  Chief Complaint Bowel perforation (HCC) [K63.1]  Level of Care/Admitting Diagnosis ED Disposition     ED Disposition  Admit   Condition  --   Comment  Hospital Area: [facility] [[id number]]  Level of Care: Stepdown [14]  Admit to SDU based on following  criteria: Severe physiological/psychological symptoms:  Any diagnosis requiring assessment & intervention at least every 4 hours on an ongoing basis to obtain desired patient outcomes including stability and rehabilitation  May admit patient to [name] or [name] if equivalent level of care is available:: No  Covid Evaluation: Asymptomatic - no recent exposure (last 10 days) testing not required  Diagnosis: Bowel perforation [facility]) [[id number]]  Admitting Physician: Maryln Gottron [[id number]]  Attending Physician: Olexa.Dam, MIR Jaxson.Roy [[id number]]  Certification:: I certify this patient will need inpatient services for at least 2 midnights          Medical History Past Medical History:  Diagnosis Date   Allergy    dust, pollen, sulfa, prednisone   Anemia    Anxiety    Arthritis    "knees" (04/26/2016)   Colon polyps    benign per pt   Coronary artery disease    mild per 2015 cath in [place] (OM1 30%, RCA 30%)   GERD (gastroesophageal reflux disease)    Gout    History of hiatal hernia    Hyperlipidemia 05/20/2021   Hypertension    Migraine    "none since early /2017" (05/06/2016)   Obesity    OSA on CPAP    uses CPAP   Pre-diabetes    Pre-operative cardiovascular examination 08/22/2008   Renal insufficiency 11/09/2022   Vasculitis (HCC)  Bilateral    Allergies Allergies  Allergen Reactions   Prednisone Swelling    Legs swell    Tape Hives   Sulfa Antibiotics Rash    IV Location/Drains/Wounds Patient Lines/Drains/Airways Status     Active Line/Drains/Airways     Name Placement date Placement time Site Days   Peripheral IV 03/21/23 20 G Left Antecubital 03/21/23  0758  Antecubital  less than 1   Gastrostomy/Enterostomy Gastrostomy 16 Fr. LUQ 01/25/23  1830  LUQ  55   Incision - 3 Ports Umbilicus Mid;Upper Mid;Lateral --  --  -- --   Pressure Injury 01/08/23 Coccyx Mid Stage 2 -  Partial thickness loss of dermis presenting as a shallow open injury with a red,  pink wound bed without slough. 01/08/23  1715  -- 72   Pressure Injury 01/08/23 Buttocks Right Stage 2 -  Partial thickness loss of dermis presenting as a shallow open injury with a red, pink wound bed without slough. 2 x 10 cm (appears healed stage 2) 01/08/23  1715  -- 72   Pressure Injury 01/08/23 Buttocks Right Stage 2 -  Partial thickness loss of dermis presenting as a shallow open injury with a red, pink wound bed without slough. 2.5 x 5 cm stage 2 01/08/23  1715  -- 72   Pressure Injury 01/08/23 Thigh Left Stage 2 -  Partial thickness loss of dermis presenting as a shallow open injury with a red, pink wound bed without slough. 5 x 0.5 cm stage 2 to left inner thigh 01/08/23  1715  -- 72   Pressure Injury 01/08/23 Buttocks Left Stage 2 -  Partial thickness loss of dermis presenting as a shallow open injury with a red, pink wound bed without slough. 2 x 1 cm left buttock stage 2 01/08/23  1715  -- 72   Pressure Injury 01/08/23 Buttocks Left Stage 2 -  Partial thickness loss of dermis presenting as a shallow open injury with a red, pink wound bed without slough. 1 x 1 cm stage 2s x 5 seperate areas noted to left buttocks 01/08/23  1715  -- 72   Pressure Injury 01/08/23 Buttocks Left Stage 2 -  Partial thickness loss of dermis presenting as a shallow open injury with a red, pink wound bed without slough. 3 x 2 cm stage 2 left buttocks 01/08/23  1715  -- 72   Pressure Injury 01/08/23 Buttocks Left Stage 2 -  Partial thickness loss of dermis presenting as a shallow open injury with a red, pink wound bed without slough. 3 x 2 stage 2 to left buttocks 01/08/23  1715  -- 72   Pressure Injury 01/08/23 Buttocks Left;Upper Stage 2 -  Partial thickness loss of dermis presenting as a shallow open injury with a red, pink wound bed without slough. 05 x 0.5 left upper buttocks stage 2 01/08/23  1715  -- 72   Pressure Injury 01/08/23 Buttocks Right Stage 3 -  Full thickness tissue loss. Subcutaneous fat may be visible  but bone, tendon or muscle are NOT exposed. 5 x 2 stage 3 01/08/23  1715  -- 72   Pressure Injury 01/08/23 Buttocks Right Stage 3 -  Full thickness tissue loss. Subcutaneous fat may be visible but bone, tendon or muscle are NOT exposed. 2 x 1 stage 3 01/08/23  1715  -- 72   Pressure Injury 01/08/23 Buttocks Left;Upper Stage 2 -  Partial thickness loss of dermis presenting as a shallow open injury with a red, pink  wound bed without slough. pinpoint stage 2 (unable to measure) 01/08/23  1715  -- 72            Labs/Imaging Results for orders placed or performed during the hospital encounter of 03/21/23 (from the past 48 hour(s))  Comprehensive metabolic panel     Status: Abnormal   Collection Time: 03/21/23  7:15 AM  Result Value Ref Range   Sodium 146 (H) 135 - 145 mmol/L   Potassium 3.9 3.5 - 5.1 mmol/L   Chloride 116 (H) 98 - 111 mmol/L   CO2 20 (L) 22 - 32 mmol/L   Glucose, Bld 90 70 - 99 mg/dL    Comment: Glucose reference range applies only to samples taken after fasting for at least 8 hours.   BUN 29 (H) 8 - 23 mg/dL   Creatinine, Ser 1.61 (H) 0.44 - 1.00 mg/dL   Calcium 8.9 8.9 - 09.6 mg/dL   Total Protein 6.9 6.5 - 8.1 g/dL   Albumin 2.7 (L) 3.5 - 5.0 g/dL   AST 33 15 - 41 U/L   ALT 20 0 - 44 U/L   Alkaline Phosphatase 108 38 - 126 U/L   Total Bilirubin 0.8 <1.2 mg/dL   GFR, Estimated 55 (L) >60 mL/min    Comment: (NOTE) Calculated using the CKD-EPI Creatinine Equation (2021)    Anion gap 10 5 - 15    Comment: Performed at [facility], [address] [place], [place] [address]  Lipase, blood     Status: Abnormal   Collection Time: 03/21/23  7:15 AM  Result Value Ref Range   Lipase 55 (H) 11 - 51 U/L    Comment: Performed at [facility], [address], [place], [place] [address]  CBC with Diff     Status: Abnormal   Collection Time: 03/21/23  7:15 AM  Result Value Ref Range   WBC 6.9 4.0 - 10.5 K/uL   RBC 3.75 (L) 3.87 - 5.11  MIL/uL   Hemoglobin 11.1 (L) 12.0 - 15.0 g/dL   HCT 14.7 82.9 - 56.2 %   MCV 96.0 80.0 - 100.0 fL   MCH 29.6 26.0 - 34.0 pg   MCHC 30.8 30.0 - 36.0 g/dL   RDW 13.0 (H) 86.5 - 78.4 %   Platelets 312 150 - 400 K/uL   nRBC 0.0 0.0 - 0.2 %   Neutrophils Relative % 79 %   Neutro Abs 5.5 1.7 - 7.7 K/uL   Lymphocytes Relative 14 %   Lymphs Abs 0.9 0.7 - 4.0 K/uL   Monocytes Relative 7 %   Monocytes Absolute 0.5 0.1 - 1.0 K/uL   Eosinophils Relative 0 %   Eosinophils Absolute 0.0 0.0 - 0.5 K/uL   Basophils Relative 0 %   Basophils Absolute 0.0 0.0 - 0.1 K/uL   Immature Granulocytes 0 %   Abs Immature Granulocytes 0.02 0.00 - 0.07 K/uL    Comment: Performed at [facility], [address] [place], [place] [address]   CT ABDOMEN PELVIS W CONTRAST  Addendum Date: 03/21/2023   ADDENDUM REPORT: 03/21/2023 12:52 ADDENDUM: Small pockets of gas situated between the bowel and liver. Evidence for an ulceration near the GJ anastomosis on image 52/8 and image 50/8. Findings are suggestive for an anastomotic ulceration and likely the source for the free air. These results were communicated on 03/21/2023 at 12:49 pm to provider Hosie Spangle, who acknowledged these results. Electronically Signed   By: Richarda Overlie  M.D.   On: 03/21/2023 12:52   Result Date: 03/21/2023 CLINICAL DATA:  72 year old female with abdominal pain. Pain at gastrostomy tube placed about 5 weeks ago. EXAM: CT ABDOMEN AND PELVIS WITH CONTRAST TECHNIQUE: Multidetector CT imaging of the abdomen and pelvis was performed using the standard protocol following bolus administration of intravenous contrast. RADIATION DOSE REDUCTION: This exam was performed according to the departmental dose-optimization program which includes automated exposure control, adjustment of the mA and/or kV according to patient size and/or use of iterative reconstruction technique. CONTRAST:  OMNIPAQUE IOHEXOL 300 MG/ML  SOLN COMPARISON:  CT  Abdomen and Pelvis 01/08/2023. FINDINGS: Lower chest: Heart size remains normal. No pericardial or pleural effusion. Increased patchy lung base opacity most resembling atelectasis. Hepatobiliary: Pneumoperitoneum. Small volume of perihepatic free fluid with simple fluid density. Hepatic steatosis. Absent gallbladder. Pancreas: Mild inflammation in the lesser sac appears related to the stomach rather than the pancreas. Negative pancreas otherwise. Spleen: Trace perisplenic free fluid. Adrenals/Urinary Tract: Normal adrenal glands. Nonobstructed kidneys. Chronic renal cysts (no follow-up imaging recommended). Absent renal contrast excretion on the delayed images. But the ureters are decompressed. Bladder is decompressed. Chronic pelvic phleboliths. Stomach/Bowel: Chronic Roux-en-Y type gastrojejunostomy with a superimposed percutaneous gastrostomy tube into the distal portion of the stomach. Abnormal pneumoperitoneum, moderate volume in the upper abdomen and inflammation surrounding the stomach and gastrojejunostomy (series 4, images 22 and 24). Furthermore, the balloon of the enteric tube is at the level of the abdominal wall, peritoneal lining on series 4, image 33. Mild regional inflammation. No fluid collection. The distal stomach and proximal duodenum also appear inflamed. Distal duodenum and ligament of Treitz have a more normal appearance. Downstream small bowel anastomosis on series 4, image 36 appears negative. Superimposed large bowel is redundant but mostly decompressed. Mild secondary inflammation of the splenic flexure with regional pneumoperitoneum there. Negative right colon. Cecum on a lax mesentery series 4, image 51. Evidence of prior appendectomy. Vascular/Lymphatic: Calcified aortic atherosclerosis. Major arterial structures in the abdomen and pelvis are patent. Normal caliber abdominal aorta. Portal venous system appears to be patent. No lymphadenopathy identified. Reproductive: Chronically absent  uterus. Ovaries are within normal limits. Other: Small volume of simple density free fluid at the pelvic inlet on the right series 4, image 65. And small volume of what appears to be fluid tracking into and otherwise fat containing right inguinal hernia (series 4, image 78), new since September. Musculoskeletal: Advanced lumbar facet arthropathy. Stable visualized osseous structures. IMPRESSION: 1. Malpositioned percutaneous Gastrostomy Tube, with balloon pulled out through the ventral gastric wall and associated pneumoperitoneum and free fluid in the abdomen. The stomach, a chronic gastrojejunostomy, and the proximal duodenum all appear inflamed. But no drainable fluid collection, abscess. 2. Underlying chronic Roux-en-Y type gastric bypass. Small bowel distal to the gastrojejunostomy and the ligament of Treitz appears to remain normal. Mild secondary inflammation of the splenic flexure. 3. Small volume of free fluid in the pelvis, within a right inguinal hernia appears related to #1. 4. Hepatic steatosis. Lung base atelectasis. Aortic Atherosclerosis (ICD10-I70.0). Electronically Signed: By: Odessa Fleming M.D. On: 03/21/2023 09:48    Pending Labs Unresulted Labs (From admission, onward)     Start     Ordered   03/22/23 0500  Basic metabolic panel  Tomorrow morning,   R        03/21/23 1317   03/22/23 0500  CBC  Tomorrow morning,   R        03/21/23 1317   03/21/23 1302  Lactic acid, plasma  (Lactic Acid)  STAT Now then every 3 hours,   R (with STAT occurrences)      03/21/23 1301   03/21/23 0715  Urinalysis, Routine w reflex microscopic -Urine, Clean Catch  Once,   URGENT       Question Answer Comment  Specimen Source Urine, Clean Catch   Release to patient Immediate      03/21/23 0714            Vitals/Pain Today's Vitals   03/21/23 0900 03/21/23 1006 03/21/23 1031 03/21/23 1246  BP: (!) 149/109  (!) 100/55 (!) 99/55  Pulse: (!) 123  (!) 122 (!) 126  Resp: (!) 29  20 (!) 22  Temp:   98.3  F (36.8 C) 98.2 F (36.8 C)  TempSrc:   Oral Oral  SpO2: 100%  99% 98%  PainSc:  2  2  2      Isolation Precautions No active isolations  Medications Medications  enoxaparin (LOVENOX) injection 40 mg (has no administration in time range)  0.9 %  sodium chloride infusion (has no administration in time range)  acetaminophen (TYLENOL) tablet 650 mg (has no administration in time range)    Or  acetaminophen (TYLENOL) suppository 650 mg (has no administration in time range)  morphine (PF) 2 MG/ML injection 1 mg (has no administration in time range)  ondansetron (ZOFRAN) tablet 4 mg (has no administration in time range)    Or  ondansetron (ZOFRAN) injection 4 mg (has no administration in time range)  albuterol (PROVENTIL) (2.5 MG/3ML) 0.083% nebulizer solution 2.5 mg (has no administration in time range)  piperacillin-tazobactam (ZOSYN) IVPB 3.375 g (has no administration in time range)  pantoprazole (PROTONIX) injection 40 mg (has no administration in time range)  morphine (PF) 4 MG/ML injection 4 mg (4 mg Intravenous Given 03/21/23 0931)  ondansetron (ZOFRAN) injection 4 mg (4 mg Intravenous Given 03/21/23 0930)  iohexol (OMNIPAQUE) 300 MG/ML solution 100 mL (100 mLs Intravenous Contrast Given 03/21/23 0912)  lactated ringers bolus 500 mL (0 mLs Intravenous Stopped 03/21/23 1047)  sodium chloride 0.9 % bolus 1,000 mL (1,000 mLs Intravenous New Bag/Given 03/21/23 1326)    Mobility non-ambulatory

## 2023-03-21 NOTE — Anesthesia Preprocedure Evaluation (Addendum)
Anesthesia Evaluation  Patient identified by MRN, date of birth, ID band Patient awake  General Assessment Comment:chronically ill 72 y.o. 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   Reviewed: Allergy & Precautions, H&P , NPO status , Patient's Chart, lab work & pertinent test results  Airway Mallampati: III  TM Distance: >3 FB Neck ROM: Full    Dental no notable dental hx.    Pulmonary sleep apnea , former smoker   breath sounds clear to auscultation + decreased breath sounds      Cardiovascular hypertension,  Rhythm:Regular Rate:Tachycardia     Neuro/Psych  Neuromuscular disease  negative psych ROS   GI/Hepatic Neg liver ROS,GERD  ,,  Endo/Other    Class 4 obesity  Renal/GU negative Renal ROS  negative genitourinary   Musculoskeletal negative musculoskeletal ROS (+)    Abdominal   Peds negative pediatric ROS (+)  Hematology negative hematology ROS (+)   Anesthesia Other Findings   Reproductive/Obstetrics negative OB ROS                             Anesthesia Physical Anesthesia Plan  ASA: 3 and emergent  Anesthesia Plan: General   Post-op Pain Management:    Induction: Intravenous and Rapid sequence  PONV Risk Score and Plan: 3 and Ondansetron, Dexamethasone and Treatment may vary due to age or medical condition  Airway Management Planned: Oral ETT  Additional Equipment:   Intra-op Plan:   Post-operative Plan: Extubation in OR  Informed Consent: I have reviewed the patients History and Physical, chart, labs and discussed the procedure including  the risks, benefits and alternatives for the proposed anesthesia with the patient or authorized representative who has indicated his/her understanding and acceptance.     Dental advisory given  Plan Discussed with: CRNA and Surgeon  Anesthesia Plan Comments:        Anesthesia Quick Evaluation

## 2023-03-21 NOTE — Op Note (Signed)
Date: 03/21/23  Patient: Christine [name] MRN: [id number]  Preoperative Diagnosis: Perforated marginal ulcer Postoperative Diagnosis: Same  Procedure: Exploratory laparotomy, abdominal washout, Graham patch repair of perforated gastrojejunal ulcer, intraperitoneal drain placement.  Surgeon: Sophronia Simas, MD Assistant: Phylliss Blakes, MD  EBL: Minimal  Anesthesia: General endotracheal  Specimens: None  Indications: Christine [name] is a 72 yo female who underwent a laparoscopic roux-en-Y gastric bypass in May of this year. She has had failure to thrive postoperatively and subsequently underwent placement of a G tube into the excluded stomach about 2 months ago. Today she presented to the ED with severe abdominal pain, and was found to have pneumoperitoneum as well as malposition of the G tube within the abdominal wall. The G tube was repositioned by IR in fluoro, and an upper GI was done which showed extravasation of contrast at the GJ anastomosis. The patient was brought to the operating room urgently for exploration.  Findings: Perforated ulcer at the gastrojejunal anastomosis on the right anterior side of the anastomosis. Perforation was approximately 4mm in diameter. Repaired with a [name] patch. G tube appropriately positioned within the excluded stomach, with the bumper at 4cm at the skin. JP drain placed adjacent to the [facility].  Procedure details: Informed consent was obtained in the preoperative area prior to the procedure. The patient was brought to the operating room and placed on the table in the supine position. General anesthesia was induced and appropriate lines and drains were placed for intraoperative monitoring. Perioperative antibiotics were administered per SCIP guidelines. The abdomen was prepped and draped in the usual sterile fashion. A pre-procedure timeout was taken verifying patient identity, surgical site and procedure to be performed.  An upper midline skin  incision was made and the subcutaneous tissue was divided with cautery to expose the fascia.  The fascia was elevated and opened along the linea alba.  The peritoneum was opened with cautery.  There was a moderate amount of turbid fluid on entry into the abdomen, which was evacuated with suction.  The falciform ligament was taken down off the abdominal wall, ligated with 2-0 silk ties, and divided.  A Bookwalter fixed retractor was placed.  There was fibrinous exudate on the left lateral liver and in the left upper quadrant.  The left lateral liver was gently retracted cephalad, and the gastric pouch was identified.  A perforation was immediately visible on the right lateral side of the GJ anastomosis, with ongoing leakage of gastric contents.  The entire abdomen was copiously irrigated with multiple liters of warmed saline.  A tongue of omentum was mobilized using cautery.  A row of 3-0 silk Lembert sutures was placed over the GJ perforation, and the omental patch  was secured in place over the perforation within the tails of the silk sutures.  The sutures were tied down.  The excluded stomach was palpated to confirm that the G-tube balloon was in appropriate position within the lumen of the stomach.  The bumper was positioned at 4 cm at the skin.  The bumper was sutured to the skin with 2-0 nylon suture.  A 19-Fr JP drain was then placed adjacent to the [facility] and brought out through the right upper quadrant abdominal wall.  The drain was secured to the skin with 2-0 nylon suture.  The retractors were removed.  The fascia was closed at midline with a running looped 1 PDS suture.  The skin was left open and packed with a saline wet-to-dry dressing.  The patient tolerated the procedure well with no apparent complications.  All counts were correct x2 at the end of the procedure. The patient was extubated and taken to PACU in stable condition.  Sophronia Simas, MD 03/21/23 6:39 PM

## 2023-03-21 NOTE — Transfer of Care (Signed)
Immediate Anesthesia Transfer of Care Note  Patient: Christine [name]  Procedure(s) Performed: EXPLORATORY LAPAROTOMY, PATCH REPAIR OF GASTRIC ULCER  Patient Location: PACU  Anesthesia Type:General  Level of Consciousness: awake, alert , and oriented  Airway & Oxygen Therapy: Patient Spontanous Breathing and Patient connected to nasal cannula oxygen  Post-op Assessment: Report given to RN and Post -op Vital signs reviewed and stable  Post vital signs: Reviewed and stable  Last Vitals:  Vitals Value Taken Time  BP 146/94 03/21/23 1846  Temp    Pulse 120 03/21/23 1848  Resp 26 03/21/23 1848  SpO2 99 % 03/21/23 1848  Vitals shown include unfiled device data.  Last Pain:  Vitals:   03/21/23 1646  TempSrc: Oral  PainSc: 9          Complications: No notable events documented.

## 2023-03-21 NOTE — Anesthesia Procedure Notes (Signed)
Procedure Name: Intubation Date/Time: 03/21/2023 6:37 PM  Performed by: Floydene Flock, CRNAPre-anesthesia Checklist: Patient identified, Emergency Drugs available, Patient being monitored, Suction available and Timeout performed Patient Re-evaluated:Patient Re-evaluated prior to induction Oxygen Delivery Method: Circle system utilized Preoxygenation: Pre-oxygenation with 100% oxygen Induction Type: IV induction Laryngoscope Size: Mac and 3 Grade View: Grade I Tube type: Oral Tube size: 7.5 mm Number of attempts: 1 Airway Equipment and Method: Stylet Placement Confirmation: ETT inserted through vocal cords under direct vision, positive ETCO2, CO2 detector and breath sounds checked- equal and bilateral Secured at: 22 cm Tube secured with: Tape Dental Injury: Teeth and Oropharynx as per pre-operative assessment

## 2023-03-21 NOTE — Anesthesia Postprocedure Evaluation (Signed)
Anesthesia Post Note  Patient: Christine [name]  Procedure(s) Performed: EXPLORATORY LAPAROTOMY, PATCH REPAIR OF GASTRIC ULCER     Patient location during evaluation: PACU Anesthesia Type: General Level of consciousness: awake and alert Pain management: pain level controlled Vital Signs Assessment: post-procedure vital signs reviewed and stable Respiratory status: spontaneous breathing, nonlabored ventilation, respiratory function stable and patient connected to nasal cannula oxygen Cardiovascular status: blood pressure returned to baseline and stable Postop Assessment: no apparent nausea or vomiting Anesthetic complications: no  No notable events documented.  Last Vitals:  Vitals:   03/21/23 1843 03/21/23 1845  BP: (!) 131/98 (!) 146/94  Pulse:  (!) 119  Resp: (!) 24 (!) 29  Temp:  36.5 C  SpO2:  100%    Last Pain:  Vitals:   03/21/23 1646  TempSrc: Oral  PainSc: 9                  Rateel Beldin S

## 2023-03-21 NOTE — ED Triage Notes (Signed)
PT BIB EMS from [facility] for pain around feeding tube that she rates a 9/10. Pain has been going on for two days but got worse today. Pain worse with palpation and movement. Feeding tube was placed 5 weeks ago but was supposed to be removed 3 weeks ago.

## 2023-03-21 NOTE — ED Provider Notes (Signed)
[place] EMERGENCY DEPARTMENT AT [facility] Provider Note   CSN: [id number] Arrival date & time: 03/21/23  0559     History {Add pertinent medical, surgical, social history, OB history to HPI:1} Chief Complaint  Patient presents with   Abdominal Pain   Feeding Tube Problem    Christine [name] is a 72 y.o. female.  Patient with history of CAD, GERD, hyperlipidemia, hypertension, obesity presents today with complaints of abdominal pain.  States that same began this morning and has been persistent since then.  She notes that the pain is where her feeding tube is.  States that she had this feeding tube placed around 5 weeks ago, was told that she would have it out in a few weeks but has not yet.  She is no longer using the tube for feeds.  States she has not had a tube feed in multiple weeks.  Does endorse a few episodes of vomiting this morning.  No diarrhea.  She is having regular bowel movements.  Denies fevers or chills.  No chest pain or shortness of breath.   The history is provided by the patient. No language interpreter was used.  Abdominal Pain      Home Medications Prior to Admission medications   Medication Sig Start Date End Date Taking? Authorizing Provider  acetaminophen (TYLENOL) 325 MG tablet Take 650 mg by mouth every 4 (four) hours as needed for mild pain or moderate pain.    [provider]  antiseptic oral rinse (BIOTENE) LIQD 15 mLs by Mouth Rinse route in the morning and at bedtime.    [provider]  ascorbic acid (VITAMIN C) 500 MG tablet Take 500 mg by mouth daily.    [provider]  cephALEXin (KEFLEX) 500 MG capsule Take 1 capsule (500 mg total) by mouth 3 (three) times daily. 02/24/23   Jacalyn Lefevre, MD  cholecalciferol (CHOLECALCIFEROL) 25 MCG tablet Place 5 tablets (5,000 Units total) into feeding tube daily. 02/04/23   Leatha Gilding, MD  colchicine 0.6 MG tablet Take 1.2 mg by mouth daily.    [provider]  diclofenac Sodium (VOLTAREN) 1 % GEL Apply 2 g topically 4 (four) times daily.    [provider]  DULoxetine (CYMBALTA) 60 MG capsule Take 1 capsule (60 mg total) by mouth daily. 12/25/21   [name], DO  feeding supplement (ENSURE ENLIVE / ENSURE PLUS) LIQD Place 237 mLs into feeding tube 3 (three) times daily between meals. 02/04/23   Leatha Gilding, MD  ferrous sulfate 325 (65 FE) MG EC tablet Take 325 mg by mouth daily with breakfast.    [provider]  folic acid (FOLVITE) 1 MG tablet Take 1 tablet (1 mg total) by mouth daily. 11/16/22   Azucena Fallen, MD  gabapentin (NEURONTIN) 100 MG capsule Take 1 capsule (100 mg total) by mouth 3 (three) times daily. 11/15/22   Azucena Fallen, MD  mirtazapine (REMERON SOL-TAB) 15 MG disintegrating tablet Place 0.5 tablets (7.5 mg total) into feeding tube at bedtime. 02/04/23   Leatha Gilding, MD  Multiple Vitamin (MULTIVITAMIN WITH MINERALS) TABS tablet Take 1 tablet by mouth daily. 11/16/22   Azucena Fallen, MD  Nutritional Supplements (FEEDING SUPPLEMENT, JEVITY 1.5 CAL/FIBER,) LIQD Place 1,000 mLs into feeding tube daily. 02/04/23   Leatha Gilding, MD  Nutritional Supplements (PROMOD) LIQD Take 30 mLs by mouth 2 (two) times daily.    [provider]  omeprazole (PRILOSEC) 20  MG capsule Take 20 mg by mouth daily.    [provider]  oxybutynin (DITROPAN XL) 15 MG 24 hr tablet TAKE 1 TABLET BY MOUTH EVERY NIGHT AT BEDTIME 12/30/22   Wendling, Jilda Roche, DO  oxyCODONE (OXY IR/ROXICODONE) 5 MG immediate release tablet Place 1 tablet (5 mg total) into feeding tube every 6 (six) hours as needed for severe pain. 02/04/23   Leatha Gilding, MD  promethazine (PHENERGAN) 25 MG tablet Take 1 tablet (25 mg total) by mouth every 6 (six) hours as needed for nausea or vomiting. 10/27/22   [name], DO  Protein (FEEDING SUPPLEMENT, PROSOURCE TF20,) liquid Place 60  mLs into feeding tube daily. 02/04/23   Leatha Gilding, MD  senna (SENOKOT) 8.6 MG TABS tablet Take 1 tablet by mouth at bedtime.    [provider]  sodium chloride irrigation 0.9 % irrigation Irrigate with 75 mLs as directed See admin instructions. Use 75 mL/hr intravenously every shift for fluid resuscitation for 2 days    [provider]  thiamine (VITAMIN B-1) 100 MG tablet Place 1 tablet (100 mg total) into feeding tube daily. 02/04/23   Leatha Gilding, MD  traMADol (ULTRAM) 50 MG tablet Take 1 tablet (50 mg total) by mouth 3 (three) times daily as needed for moderate pain or severe pain. 02/04/23   Leatha Gilding, MD  zinc gluconate 50 MG tablet Take 50 mg by mouth daily.    [provider]      Allergies    Prednisone, Tape, and Sulfa antibiotics    Review of Systems   Review of Systems  Gastrointestinal:  Positive for abdominal pain.  All other systems reviewed and are negative.   Physical Exam Updated Vital Signs BP 107/61   Pulse (!) 107   Temp 97.6 F (36.4 C) (Oral)   Resp (!) 25   SpO2 100%  Physical Exam Vitals and nursing note reviewed.  Constitutional:      General: She is not in acute distress.    Appearance: Normal appearance. She is normal weight. She is not ill-appearing, toxic-appearing or diaphoretic.  HENT:     Head: Normocephalic and atraumatic.  Cardiovascular:     Rate and Rhythm: Normal rate.  Pulmonary:     Effort: Pulmonary effort is normal. No respiratory distress.  Abdominal:     General: Abdomen is flat.     Palpations: Abdomen is soft.     Comments: PEG tube in place left upper quadrant without signs of infection.  Generalized abdominal tenderness to palpation throughout with most pain in left upper quadrant.  Musculoskeletal:        General: Normal range of motion.     Cervical back: Normal range of motion.  Skin:    General: Skin is warm and dry.  Neurological:     General: No focal deficit present.      Mental Status: She is alert.  Psychiatric:        Mood and Affect: Mood normal.        Behavior: Behavior normal.     ED Results / Procedures / Treatments   Labs (all labs ordered are listed, but only abnormal results are displayed) Labs Reviewed  COMPREHENSIVE METABOLIC PANEL - Abnormal; Notable for the following components:      Result Value   Sodium 146 (*)    Chloride 116 (*)    CO2 20 (*)    BUN 29 (*)    Creatinine, Ser  1.08 (*)    Albumin 2.7 (*)    GFR, Estimated 55 (*)    All other components within normal limits  LIPASE, BLOOD - Abnormal; Notable for the following components:   Lipase 55 (*)    All other components within normal limits  CBC WITH DIFFERENTIAL/PLATELET - Abnormal; Notable for the following components:   RBC 3.75 (*)    Hemoglobin 11.1 (*)    RDW 17.9 (*)    All other components within normal limits  URINALYSIS, ROUTINE W REFLEX MICROSCOPIC  LACTIC ACID, PLASMA  LACTIC ACID, PLASMA  TROPONIN I (HIGH SENSITIVITY)    EKG EKG Interpretation Date/Time:  Monday March 21 2023 10:52:10 EST Ventricular Rate:  125 PR Interval:  132 QRS Duration:  116 QT Interval:  333 QTC Calculation: 481 R Axis:   88  Text Interpretation: Sinus tachycardia LVH with IVCD and secondary repol abnrm ST depr, consider ischemia, inferior leads ST elevation, consider lateral injury new TWI in the inferior and lateral leads Confirmed by Derwood Kaplan [phone]) on 03/21/2023 1:17:02 PM  Radiology CT ABDOMEN PELVIS W CONTRAST  Addendum Date: 03/21/2023   ADDENDUM REPORT: 03/21/2023 12:52 ADDENDUM: Small pockets of gas situated between the bowel and liver. Evidence for an ulceration near the GJ anastomosis on image 52/8 and image 50/8. Findings are suggestive for an anastomotic ulceration and likely the source for the free air. These results were communicated on 03/21/2023 at 12:49 pm to provider Hosie Spangle, who acknowledged these results. Electronically Signed    By: Richarda Overlie M.D.   On: 03/21/2023 12:52   Result Date: 03/21/2023 CLINICAL DATA:  72 year old female with abdominal pain. Pain at gastrostomy tube placed about 5 weeks ago. EXAM: CT ABDOMEN AND PELVIS WITH CONTRAST TECHNIQUE: Multidetector CT imaging of the abdomen and pelvis was performed using the standard protocol following bolus administration of intravenous contrast. RADIATION DOSE REDUCTION: This exam was performed according to the departmental dose-optimization program which includes automated exposure control, adjustment of the mA and/or kV according to patient size and/or use of iterative reconstruction technique. CONTRAST:  OMNIPAQUE IOHEXOL 300 MG/ML  SOLN COMPARISON:  CT Abdomen and Pelvis 01/08/2023. FINDINGS: Lower chest: Heart size remains normal. No pericardial or pleural effusion. Increased patchy lung base opacity most resembling atelectasis. Hepatobiliary: Pneumoperitoneum. Small volume of perihepatic free fluid with simple fluid density. Hepatic steatosis. Absent gallbladder. Pancreas: Mild inflammation in the lesser sac appears related to the stomach rather than the pancreas. Negative pancreas otherwise. Spleen: Trace perisplenic free fluid. Adrenals/Urinary Tract: Normal adrenal glands. Nonobstructed kidneys. Chronic renal cysts (no follow-up imaging recommended). Absent renal contrast excretion on the delayed images. But the ureters are decompressed. Bladder is decompressed. Chronic pelvic phleboliths. Stomach/Bowel: Chronic Roux-en-Y type gastrojejunostomy with a superimposed percutaneous gastrostomy tube into the distal portion of the stomach. Abnormal pneumoperitoneum, moderate volume in the upper abdomen and inflammation surrounding the stomach and gastrojejunostomy (series 4, images 22 and 24). Furthermore, the balloon of the enteric tube is at the level of the abdominal wall, peritoneal lining on series 4, image 33. Mild regional inflammation. No fluid collection. The distal  stomach and proximal duodenum also appear inflamed. Distal duodenum and ligament of Treitz have a more normal appearance. Downstream small bowel anastomosis on series 4, image 36 appears negative. Superimposed large bowel is redundant but mostly decompressed. Mild secondary inflammation of the splenic flexure with regional pneumoperitoneum there. Negative right colon. Cecum on a lax mesentery series 4, image 51. Evidence of prior appendectomy. Vascular/Lymphatic: Calcified  aortic atherosclerosis. Major arterial structures in the abdomen and pelvis are patent. Normal caliber abdominal aorta. Portal venous system appears to be patent. No lymphadenopathy identified. Reproductive: Chronically absent uterus. Ovaries are within normal limits. Other: Small volume of simple density free fluid at the pelvic inlet on the right series 4, image 65. And small volume of what appears to be fluid tracking into and otherwise fat containing right inguinal hernia (series 4, image 78), new since September. Musculoskeletal: Advanced lumbar facet arthropathy. Stable visualized osseous structures. IMPRESSION: 1. Malpositioned percutaneous Gastrostomy Tube, with balloon pulled out through the ventral gastric wall and associated pneumoperitoneum and free fluid in the abdomen. The stomach, a chronic gastrojejunostomy, and the proximal duodenum all appear inflamed. But no drainable fluid collection, abscess. 2. Underlying chronic Roux-en-Y type gastric bypass. Small bowel distal to the gastrojejunostomy and the ligament of Treitz appears to remain normal. Mild secondary inflammation of the splenic flexure. 3. Small volume of free fluid in the pelvis, within a right inguinal hernia appears related to #1. 4. Hepatic steatosis. Lung base atelectasis. Aortic Atherosclerosis (ICD10-I70.0). Electronically Signed: By: Odessa Fleming M.D. On: 03/21/2023 09:48    Procedures .Critical Care  Performed by: Silva Bandy, PA-C Authorized by: Silva Bandy, PA-C   Critical care provider statement:    Critical care time (minutes):  35   Critical care was necessary to treat or prevent imminent or life-threatening deterioration of the following conditions:  Dehydration   Critical care was time spent personally by me on the following activities:  Development of treatment plan with patient or surrogate, discussions with consultants, discussions with primary provider, evaluation of patient's response to treatment, examination of patient, obtaining history from patient or surrogate, ordering and review of laboratory studies, ordering and review of radiographic studies, pulse oximetry, re-evaluation of patient's condition and review of old charts   Care discussed with: admitting provider     {Document cardiac monitor, telemetry assessment procedure when appropriate:1}  Medications Ordered in ED Medications  enoxaparin (LOVENOX) injection 40 mg (has no administration in time range)  0.9 %  sodium chloride infusion (has no administration in time range)  acetaminophen (TYLENOL) tablet 650 mg (has no administration in time range)    Or  acetaminophen (TYLENOL) suppository 650 mg (has no administration in time range)  morphine (PF) 2 MG/ML injection 1 mg (has no administration in time range)  ondansetron (ZOFRAN) tablet 4 mg (has no administration in time range)    Or  ondansetron (ZOFRAN) injection 4 mg (has no administration in time range)  albuterol (PROVENTIL) (2.5 MG/3ML) 0.083% nebulizer solution 2.5 mg (has no administration in time range)  piperacillin-tazobactam (ZOSYN) IVPB 3.375 g (has no administration in time range)  pantoprazole (PROTONIX) injection 40 mg (has no administration in time range)  morphine (PF) 4 MG/ML injection 4 mg (4 mg Intravenous Given 03/21/23 0931)  ondansetron (ZOFRAN) injection 4 mg (4 mg Intravenous Given 03/21/23 0930)  iohexol (OMNIPAQUE) 300 MG/ML solution 100 mL (100 mLs Intravenous Contrast Given 03/21/23 0912)   lactated ringers bolus 500 mL (0 mLs Intravenous Stopped 03/21/23 1047)  sodium chloride 0.9 % bolus 1,000 mL (1,000 mLs Intravenous New Bag/Given 03/21/23 1326)    ED Course/ Medical Decision Making/ A&P   {   Click here for ABCD2, HEART and other calculatorsREFRESH Note before signing :1}  Medical Decision Making Amount and/or Complexity of Data Reviewed Labs: ordered. Radiology: ordered.  Risk Prescription drug management. Decision regarding hospitalization.   This patient is a 72 y.o. female who presents to the ED for concern of abdominal pain, this involves an extensive number of treatment options, and is a complaint that carries with it a high risk of complications and morbidity. The emergent differential diagnosis prior to evaluation includes, but is not limited to, displaced PEG tube,  AAA, gastroenteritis, appendicitis, Bowel obstruction, Bowel perforation. Gastroparesis, DKA, Hernia, Inflammatory bowel disease, mesenteric ischemia, pancreatitis, peritonitis SBP, volvulus.    This is not an exhaustive differential.   Past Medical History / Co-morbidities / Social History:  has a past medical history of Allergy, Anemia, Anxiety, Arthritis, Colon polyps, Coronary artery disease, GERD (gastroesophageal reflux disease), Gout, History of hiatal hernia, Hyperlipidemia (05/20/2021), Hypertension, Migraine, Obesity, OSA on CPAP, Pre-diabetes, Pre-operative cardiovascular examination (08/22/2008), Renal insufficiency (11/09/2022), and Vasculitis (HCC).   Additional history: Chart reviewed. Pertinent results include: Chart reviewed, patient recently admitted for refusing to eat.  Significant electrolyte derangements attributed to dehydration.  Seen by surgery Dr. Dossie Der given that she had gallstones.  Considered potential etiology of patient's refusal to eat.  Patient was subsequently taken to the OR for a laparoscopic cholecystectomy.  While this was  performed, she was given a PEG tube as well.  Physical Exam: Physical exam performed. The pertinent findings include: PEG tube in place left upper quadrant.  No pain with manipulation of the tube.  Underlying tenderness to palpation with generalized abdominal pain as well  Lab Tests: I ordered, and personally interpreted labs.  The pertinent results include: No leukocytosis, hemoglobin improved from previous.  Lipase 55, NA 146, chloride 116, bicarb 20.  BUN 29, creatinine 1.08 up from 23 and 0 0.83 3 weeks ago, likely dehydration and poor oral intake.   Imaging Studies: I ordered imaging studies including CT abdomen pelvis. I independently visualized and interpreted imaging which showed   1. Malpositioned percutaneous Gastrostomy Tube, with balloon pulled out through the ventral gastric wall and associated pneumoperitoneum and free fluid in the abdomen. The stomach, a chronic gastrojejunostomy, and the proximal duodenum all appear inflamed. But no drainable fluid collection, abscess. 2. Underlying chronic Roux-en-Y type gastric bypass. Small bowel distal to the gastrojejunostomy and the ligament of Treitz appears to remain normal. Mild secondary inflammation of the splenic flexure. 3. Small volume of free fluid in the pelvis, within a right inguinal hernia appears related to #1.  I agree with the radiologist interpretation.   Cardiac Monitoring:  The patient was maintained on a cardiac monitor.  My attending physician Dr. Rhunette Croft viewed and interpreted the cardiac monitored which showed an underlying rhythm of: Sinus tachycardia with new ST depressions, no STEMI. I agree with this interpretation.   Medications: I ordered medication including fluids for dehydration and tachycardia, morphine for pain.  Reevaluation of the patient after these medicines showed that the patient stayed the same. I have reviewed the patients home medicines and have made adjustments as needed.  Consultations  Obtained: I requested consultation with the surgery on-call Hosie Spangle, PA-C,  and discussed lab and imaging findings as well as pertinent plan - they recommend: Admission to medicine.  There is some concern for an ulceration at her GJ anastomosis.  Upper GI study pending to ensure no extravasation of stomach contents.  If positive, patient will go to the OR emergently for management.  If negative, plan for IR consult to replace PEG  tube.   Disposition: After consideration of the diagnostic results and the patients response to treatment, I feel that patient will require admission for management of her displaced PEG tube with associated pneumoperitoneum.  She is also tachycardic with soft blood pressures, likely due to dehydration.  Lactic ordered to evaluate for sepsis.  Patient understanding and agreement with plan.  Discussed patient with hospitalist on-call Dr. Kirby Crigler who accepts patient for admission.   I discussed this case with my attending physician Dr. Rhunette Croft who cosigned this note including patient's presenting symptoms, physical exam, and planned diagnostics and interventions. Attending physician stated agreement with plan or made changes to plan which were implemented.    Final Clinical Impression(s) / ED Diagnoses Final diagnoses:  Problem with gastrostomy tube (HCC)  Pneumoperitoneum  Left upper quadrant abdominal pain    Rx / DC Orders ED Discharge Orders     None

## 2023-03-21 NOTE — Consult Note (Addendum)
Christine [name] 08/06/1950  [id number].    Requesting MD: Rhunette Croft, MD Chief Complaint/Reason for Consult: surgical G tube dislodged - free air and fluid on CT scan of the abdomen.  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 Patient tells me that she has not been using her G tube for food or meds for about 3 weeks. She tells me that her appetite has improved and she was eating 3 meals + snacks until 3-4 days ago when she developed abdominal pain that is all across her mid abdomen. Associated sxs include nausea and vomiting. She denies coffee ground emesis/blood in her vomit. The pain was severe so she was brought to the ED For evaluation.   ED workup: afebrile sinus tachycardia HR 120-140 bpm, BP 100/55 WBC 6.9. hgb 11.1  Creatinine 1.08 from 0.8 3 weeks ago, BUN 29 CT scan of the abdomen and pelvis shows a malpositioned G tube that appears to be in the abdominal wall along with free air and fluid. There are inflammatory changes of the stomach and duodenum and roux limb.   ROS: Review of Systems  All other systems reviewed and are negative.   Family History  Problem Relation Age of Onset   Diabetes Mother    Heart disease Mother    High blood pressure Mother    Asthma Mother    Arthritis Mother    Diabetes Father    Heart disease Father    High blood pressure Father    Asthma Father    Diabetes Sister    Alcohol abuse Brother    Drug abuse Brother    Arthritis Sister    Diabetes Sister    Varicose Veins Sister    Colon cancer Neg Hx    Esophageal cancer Neg Hx    Rectal cancer Neg Hx     Past Medical History:  Diagnosis Date   Allergy    dust, pollen, sulfa, prednisone    Anemia    Anxiety    Arthritis    "knees" (04/26/2016)   Colon polyps    benign per pt   Coronary artery disease    mild per 2015 cath in [place] (OM1 30%, RCA 30%)   GERD (gastroesophageal reflux disease)    Gout    History of hiatal hernia    Hyperlipidemia 05/20/2021   Hypertension    Migraine    "none since early /2017" (05/06/2016)   Obesity    OSA on CPAP    uses CPAP   Pre-diabetes    Pre-operative cardiovascular examination 08/22/2008   Renal insufficiency 11/09/2022   Vasculitis (HCC)    Bilateral    Past Surgical History:  Procedure Laterality Date   ABDOMINAL HYSTERECTOMY     "partial"; both ovaries present   APPENDECTOMY  05/06/2016   BIOPSY  04/08/2022   Procedure: BIOPSY;  Surgeon: Sherrilyn Rist, MD;  Location: [facility];  Service: Gastroenterology;;   CARDIAC CATHETERIZATION  ~ 2015   CARDIAC CATHETERIZATION  2015   In [place]   CHILECTOMY Right 06/01/2017   Procedure: CHILECTOMY RIGHT FOOT;  Surgeon: Felecia Shelling, DPM;  Location: [facility];  Service: Podiatry;  Laterality: Right;   CHOLECYSTECTOMY N/A 01/25/2023   Procedure: LAPAROSCOPIC  CHOLECYSTECTOMY WITH INTRAOPERATIVE CHOLANGIOGRAM;  Surgeon: Quentin Ore, MD;  Location: [facility];  Service: General;  Laterality: N/A;   ESOPHAGOGASTRODUODENOSCOPY N/A 01/25/2023   Procedure: ESOPHAGOGASTRODUODENOSCOPY (EGD);  Surgeon: Quentin Ore, MD;  Location: [facility];  Service: General;  Laterality: N/A;   ESOPHAGOGASTRODUODENOSCOPY (EGD) WITH PROPOFOL N/A 04/08/2022   Procedure: ESOPHAGOGASTRODUODENOSCOPY (EGD) WITH PROPOFOL;  Surgeon: Sherrilyn Rist, MD;  Location: [facility];  Service: Gastroenterology;  Laterality: N/A;   LAPAROSCOPIC APPENDECTOMY N/A 05/06/2016   Procedure: LAPAROSCOPIC APPENDECTOMY;  Surgeon: Manus Rudd, MD;  Location: [facility];  Service: General;  Laterality: N/A;   LAPAROSCOPIC INSERTION GASTROSTOMY TUBE N/A 01/25/2023   Procedure: LAPAROSCOPIC INSERTION REMNANT  GASTROSTOMY TUBE;  Surgeon: Quentin Ore, MD;  Location: [facility];  Service: General;  Laterality: N/A;   POLYPECTOMY  04/08/2022   Procedure: POLYPECTOMY;  Surgeon: Sherrilyn Rist, MD;  Location: [facility];  Service: Gastroenterology;;   TONSILLECTOMY      Social History:  reports that she quit smoking about 44 years ago. Her smoking use included cigarettes. She started smoking about 49 years ago. She has a 0.6 pack-year smoking history. She has never used smokeless tobacco. She reports that she does not drink alcohol and does not use drugs.  Allergies:  Allergies  Allergen Reactions   Prednisone Swelling    Legs swell    Tape Hives   Sulfa Antibiotics Rash    (Not in a hospital admission)    Physical Exam: Blood pressure (!) 100/55, pulse (!) 122, temperature 98.3 F (36.8 C), temperature source Oral, resp. rate 20, SpO2 99%. General: Pleasant female, laying in hospital bed, NAD HEENT: head -normocephalic, atraumatic; Eyes: PERRLA, no conjunctival injection, anicteric sclerae, poor dentition Neck- trachea midline CV- sinus tachycardia, HR 133-140 during my exam, regular rhythm, no lower extremity edema  Pulm- breathing is non-labored Abd- soft, G tube in place, clamped, no drainage or cellulitis around the tube, patient with TTP in the RUQ and epigastric region with guarding, no rebound tenderness/peritonitis  GU- deferred  MSK- UE/LE symmetrical Neuro- non-focal exam Psych- Alert and Oriented x3 with appropriate affect Skin: warm and dry, no rashes or lesions   Results for orders placed or performed during the hospital encounter of 03/21/23 (from the past 48 hour(s))  Comprehensive metabolic panel     Status: Abnormal   Collection Time: 03/21/23  7:15 AM  Result Value Ref Range   Sodium 146 (H) 135 - 145 mmol/L   Potassium 3.9 3.5 - 5.1 mmol/L   Chloride 116 (H) 98 - 111 mmol/L   CO2 20 (L) 22 - 32 mmol/L   Glucose, Bld 90 70 - 99 mg/dL    Comment: Glucose  reference range applies only to samples taken after fasting for at least 8 hours.   BUN 29 (H) 8 - 23 mg/dL   Creatinine, Ser 8.41 (H) 0.44 - 1.00 mg/dL   Calcium 8.9 8.9 - 32.4 mg/dL   Total Protein 6.9 6.5 - 8.1 g/dL   Albumin 2.7 (L) 3.5 - 5.0 g/dL   AST 33 15 - 41 U/L   ALT 20 0 - 44 U/L   Alkaline Phosphatase 108 38 - 126 U/L   Total Bilirubin 0.8 <1.2 mg/dL   GFR, Estimated 55 (L) >60 mL/min    Comment: (NOTE) Calculated using the CKD-EPI Creatinine Equation (2021)    Anion gap 10 5 - 15    Comment: Performed at [facility], [address]  [place], [place] [address]  Lipase, blood     Status: Abnormal   Collection Time: 03/21/23  7:15 AM  Result Value Ref Range   Lipase 55 (H) 11 - 51 U/L    Comment: Performed at [facility], [address] [place], [place] [address]  CBC with Diff     Status: Abnormal   Collection Time: 03/21/23  7:15 AM  Result Value Ref Range   WBC 6.9 4.0 - 10.5 K/uL   RBC 3.75 (L) 3.87 - 5.11 MIL/uL   Hemoglobin 11.1 (L) 12.0 - 15.0 g/dL   HCT 09.8 11.9 - 14.7 %   MCV 96.0 80.0 - 100.0 fL   MCH 29.6 26.0 - 34.0 pg   MCHC 30.8 30.0 - 36.0 g/dL   RDW 82.9 (H) 56.2 - 13.0 %   Platelets 312 150 - 400 K/uL   nRBC 0.0 0.0 - 0.2 %   Neutrophils Relative % 79 %   Neutro Abs 5.5 1.7 - 7.7 K/uL   Lymphocytes Relative 14 %   Lymphs Abs 0.9 0.7 - 4.0 K/uL   Monocytes Relative 7 %   Monocytes Absolute 0.5 0.1 - 1.0 K/uL   Eosinophils Relative 0 %   Eosinophils Absolute 0.0 0.0 - 0.5 K/uL   Basophils Relative 0 %   Basophils Absolute 0.0 0.0 - 0.1 K/uL   Immature Granulocytes 0 %   Abs Immature Granulocytes 0.02 0.00 - 0.07 K/uL    Comment: Performed at [facility], [address], [place], [place] [address]   CT ABDOMEN PELVIS W CONTRAST  Result Date: 03/21/2023 CLINICAL DATA:  72 year old female with abdominal pain. Pain at gastrostomy tube placed about 5 weeks ago. EXAM: CT ABDOMEN AND  PELVIS WITH CONTRAST TECHNIQUE: Multidetector CT imaging of the abdomen and pelvis was performed using the standard protocol following bolus administration of intravenous contrast. RADIATION DOSE REDUCTION: This exam was performed according to the departmental dose-optimization program which includes automated exposure control, adjustment of the mA and/or kV according to patient size and/or use of iterative reconstruction technique. CONTRAST:  OMNIPAQUE IOHEXOL 300 MG/ML  SOLN COMPARISON:  CT Abdomen and Pelvis 01/08/2023. FINDINGS: Lower chest: Heart size remains normal. No pericardial or pleural effusion. Increased patchy lung base opacity most resembling atelectasis. Hepatobiliary: Pneumoperitoneum. Small volume of perihepatic free fluid with simple fluid density. Hepatic steatosis. Absent gallbladder. Pancreas: Mild inflammation in the lesser sac appears related to the stomach rather than the pancreas. Negative pancreas otherwise. Spleen: Trace perisplenic free fluid. Adrenals/Urinary Tract: Normal adrenal glands. Nonobstructed kidneys. Chronic renal cysts (no follow-up imaging recommended). Absent renal contrast excretion on the delayed images. But the ureters are decompressed. Bladder is decompressed. Chronic pelvic phleboliths. Stomach/Bowel: Chronic Roux-en-Y type gastrojejunostomy with a superimposed percutaneous gastrostomy tube into the distal portion of the stomach. Abnormal pneumoperitoneum, moderate volume in the upper abdomen and inflammation surrounding the stomach and gastrojejunostomy (series 4, images 22 and 24). Furthermore, the balloon of the enteric tube is at the level of the abdominal wall, peritoneal lining on series 4, image 33. Mild regional inflammation. No fluid collection. The distal stomach and proximal duodenum also appear inflamed. Distal duodenum and ligament of Treitz have a more normal appearance. Downstream small bowel anastomosis on series 4, image 36 appears negative.  Superimposed large bowel is redundant but mostly decompressed. Mild secondary inflammation of the splenic flexure with regional pneumoperitoneum there. Negative right colon. Cecum on a lax mesentery series 4, image 51. Evidence of prior appendectomy.  Vascular/Lymphatic: Calcified aortic atherosclerosis. Major arterial structures in the abdomen and pelvis are patent. Normal caliber abdominal aorta. Portal venous system appears to be patent. No lymphadenopathy identified. Reproductive: Chronically absent uterus. Ovaries are within normal limits. Other: Small volume of simple density free fluid at the pelvic inlet on the right series 4, image 65. And small volume of what appears to be fluid tracking into and otherwise fat containing right inguinal hernia (series 4, image 78), new since September. Musculoskeletal: Advanced lumbar facet arthropathy. Stable visualized osseous structures. IMPRESSION: 1. Malpositioned percutaneous Gastrostomy Tube, with balloon pulled out through the ventral gastric wall and associated pneumoperitoneum and free fluid in the abdomen. The stomach, a chronic gastrojejunostomy, and the proximal duodenum all appear inflamed. But no drainable fluid collection, abscess. 2. Underlying chronic Roux-en-Y type gastric bypass. Small bowel distal to the gastrojejunostomy and the ligament of Treitz appears to remain normal. Mild secondary inflammation of the splenic flexure. 3. Small volume of free fluid in the pelvis, within a right inguinal hernia appears related to #1. 4. Hepatic steatosis. Lung base atelectasis. Aortic Atherosclerosis (ICD10-I70.0). Electronically Signed   By: Odessa Fleming M.D.   On: 03/21/2023 09:48      Assessment/Plan: Pneumoperitoneum and gastritis/duodenitis  Malpositioned G tube PMH roux-en-y gastric bypass 72 y/o F w/ hx robotic gastric bypass 09/14/22 who developed failure to thrive and subsequently underwent lap chole, laparoscopic g tube insertion. Normal EGD by Dr.  Dossie Der 01/25/23. She was discharged to SNF on 10/11. She presents from SNF with 3-4 days of progressive abdominal pain. CT scan w/ free air along with a malpositioned G tube. CT was reviewed with Dr. Lowella Dandy who favors the free air to be due to a marginal ulcer. The patient is currently hemodynamically stable without peritonitis. I have ordered a STAT UGI to further assess for possible perforated GJ ulcer. If there is contrast extravasation she will likely need to go to the OR for emergent graham patch of her perforated ulcer, along with G tube revision. If there is no extravasation or if there is a contained perforation, it may be reasonable to treat her non-operatively with bowel rest and IV antibiotics. If that is the case then IR will replaced her G tube under fluoroscopic guidance.  Admit to medical service due to MMP- pt with some ST depressions on telemetry being workup up by medicine with EKG/troponins. Pending this workup she may need transfer [facility] reasons. STAT UGI pending, if intra-abdominal free air is driving her tachycardia and soft BPs she may need surgery here at [facility] before she is considered stable for transfer.    IV protonix q 12h ordered IV abx ordered       I reviewed nursing notes, ED provider notes, hospitalist notes, last 24 h vitals and pain scores, last 48 h intake and output, last 24 h labs and trends, and last 24 h imaging results  HIGH MDM.  Adam Phenix, PA-C [facility] 03/21/2023, 11:20 AM Please see Amion for pager number during day hours 7:00am-4:30pm or 7:00am -11:30am on weekends

## 2023-03-21 NOTE — H&P (View-Only) (Signed)
Christine [name] 1950/11/13  [id number].    Requesting MD: Rhunette Croft, MD Chief Complaint/Reason for Consult: surgical G tube dislodged - free air and fluid on CT scan of the abdomen.  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 Patient tells me that she has not been using her G tube for food or meds for about 3 weeks. She tells me that her appetite has improved and she was eating 3 meals + snacks until 3-4 days ago when she developed abdominal pain that is all across her mid abdomen. Associated sxs include nausea and vomiting. She denies coffee ground emesis/blood in her vomit. The pain was severe so she was brought to the ED For evaluation.   ED workup: afebrile sinus tachycardia HR 120-140 bpm, BP 100/55 WBC 6.9. hgb 11.1  Creatinine 1.08 from 0.8 3 weeks ago, BUN 29 CT scan of the abdomen and pelvis shows a malpositioned G tube that appears to be in the abdominal wall along with free air and fluid. There are inflammatory changes of the stomach and duodenum and roux limb.   ROS: Review of Systems  All other systems reviewed and are negative.   Family History  Problem Relation Age of Onset   Diabetes Mother    Heart disease Mother    High blood pressure Mother    Asthma Mother    Arthritis Mother    Diabetes Father    Heart disease Father    High blood pressure Father    Asthma Father    Diabetes Sister    Alcohol abuse Brother    Drug abuse Brother    Arthritis Sister    Diabetes Sister    Varicose Veins Sister    Colon cancer Neg Hx    Esophageal cancer Neg Hx    Rectal cancer Neg Hx     Past Medical History:  Diagnosis Date   Allergy    dust, pollen, sulfa, prednisone    Anemia    Anxiety    Arthritis    "knees" (04/26/2016)   Colon polyps    benign per pt   Coronary artery disease    mild per 2015 cath in [place] (OM1 30%, RCA 30%)   GERD (gastroesophageal reflux disease)    Gout    History of hiatal hernia    Hyperlipidemia 05/20/2021   Hypertension    Migraine    "none since early /2017" (05/06/2016)   Obesity    OSA on CPAP    uses CPAP   Pre-diabetes    Pre-operative cardiovascular examination 08/22/2008   Renal insufficiency 11/09/2022   Vasculitis (HCC)    Bilateral    Past Surgical History:  Procedure Laterality Date   ABDOMINAL HYSTERECTOMY     "partial"; both ovaries present   APPENDECTOMY  05/06/2016   BIOPSY  04/08/2022   Procedure: BIOPSY;  Surgeon: Sherrilyn Rist, MD;  Location: [facility];  Service: Gastroenterology;;   CARDIAC CATHETERIZATION  ~ 2015   CARDIAC CATHETERIZATION  2015   In [place]   CHILECTOMY Right 06/01/2017   Procedure: CHILECTOMY RIGHT FOOT;  Surgeon: Felecia Shelling, DPM;  Location: [facility];  Service: Podiatry;  Laterality: Right;   CHOLECYSTECTOMY N/A 01/25/2023   Procedure: LAPAROSCOPIC  CHOLECYSTECTOMY WITH INTRAOPERATIVE CHOLANGIOGRAM;  Surgeon: Quentin Ore, MD;  Location: [facility];  Service: General;  Laterality: N/A;   ESOPHAGOGASTRODUODENOSCOPY N/A 01/25/2023   Procedure: ESOPHAGOGASTRODUODENOSCOPY (EGD);  Surgeon: Quentin Ore, MD;  Location: [facility];  Service: General;  Laterality: N/A;   ESOPHAGOGASTRODUODENOSCOPY (EGD) WITH PROPOFOL N/A 04/08/2022   Procedure: ESOPHAGOGASTRODUODENOSCOPY (EGD) WITH PROPOFOL;  Surgeon: Sherrilyn Rist, MD;  Location: [facility];  Service: Gastroenterology;  Laterality: N/A;   LAPAROSCOPIC APPENDECTOMY N/A 05/06/2016   Procedure: LAPAROSCOPIC APPENDECTOMY;  Surgeon: Manus Rudd, MD;  Location: [facility];  Service: General;  Laterality: N/A;   LAPAROSCOPIC INSERTION GASTROSTOMY TUBE N/A 01/25/2023   Procedure: LAPAROSCOPIC INSERTION REMNANT  GASTROSTOMY TUBE;  Surgeon: Quentin Ore, MD;  Location: [facility];  Service: General;  Laterality: N/A;   POLYPECTOMY  04/08/2022   Procedure: POLYPECTOMY;  Surgeon: Sherrilyn Rist, MD;  Location: [facility];  Service: Gastroenterology;;   TONSILLECTOMY      Social History:  reports that she quit smoking about 44 years ago. Her smoking use included cigarettes. She started smoking about 49 years ago. She has a 0.6 pack-year smoking history. She has never used smokeless tobacco. She reports that she does not drink alcohol and does not use drugs.  Allergies:  Allergies  Allergen Reactions   Prednisone Swelling    Legs swell    Tape Hives   Sulfa Antibiotics Rash    (Not in a hospital admission)    Physical Exam: Blood pressure (!) 100/55, pulse (!) 122, temperature 98.3 F (36.8 C), temperature source Oral, resp. rate 20, SpO2 99%. General: Pleasant female, laying in hospital bed, NAD HEENT: head -normocephalic, atraumatic; Eyes: PERRLA, no conjunctival injection, anicteric sclerae, poor dentition Neck- trachea midline CV- sinus tachycardia, HR 133-140 during my exam, regular rhythm, no lower extremity edema  Pulm- breathing is non-labored Abd- soft, G tube in place, clamped, no drainage or cellulitis around the tube, patient with TTP in the RUQ and epigastric region with guarding, no rebound tenderness/peritonitis  GU- deferred  MSK- UE/LE symmetrical Neuro- non-focal exam Psych- Alert and Oriented x3 with appropriate affect Skin: warm and dry, no rashes or lesions   Results for orders placed or performed during the hospital encounter of 03/21/23 (from the past 48 hour(s))  Comprehensive metabolic panel     Status: Abnormal   Collection Time: 03/21/23  7:15 AM  Result Value Ref Range   Sodium 146 (H) 135 - 145 mmol/L   Potassium 3.9 3.5 - 5.1 mmol/L   Chloride 116 (H) 98 - 111 mmol/L   CO2 20 (L) 22 - 32 mmol/L   Glucose, Bld 90 70 - 99 mg/dL    Comment: Glucose  reference range applies only to samples taken after fasting for at least 8 hours.   BUN 29 (H) 8 - 23 mg/dL   Creatinine, Ser 1.61 (H) 0.44 - 1.00 mg/dL   Calcium 8.9 8.9 - 09.6 mg/dL   Total Protein 6.9 6.5 - 8.1 g/dL   Albumin 2.7 (L) 3.5 - 5.0 g/dL   AST 33 15 - 41 U/L   ALT 20 0 - 44 U/L   Alkaline Phosphatase 108 38 - 126 U/L   Total Bilirubin 0.8 <1.2 mg/dL   GFR, Estimated 55 (L) >60 mL/min    Comment: (NOTE) Calculated using the CKD-EPI Creatinine Equation (2021)    Anion gap 10 5 - 15    Comment: Performed at [facility], [address]  [place], [place] [address]  Lipase, blood     Status: Abnormal   Collection Time: 03/21/23  7:15 AM  Result Value Ref Range   Lipase 55 (H) 11 - 51 U/L    Comment: Performed at [facility], [address], [place], [place] [address]  CBC with Diff     Status: Abnormal   Collection Time: 03/21/23  7:15 AM  Result Value Ref Range   WBC 6.9 4.0 - 10.5 K/uL   RBC 3.75 (L) 3.87 - 5.11 MIL/uL   Hemoglobin 11.1 (L) 12.0 - 15.0 g/dL   HCT 51.8 84.1 - 66.0 %   MCV 96.0 80.0 - 100.0 fL   MCH 29.6 26.0 - 34.0 pg   MCHC 30.8 30.0 - 36.0 g/dL   RDW 63.0 (H) 16.0 - 10.9 %   Platelets 312 150 - 400 K/uL   nRBC 0.0 0.0 - 0.2 %   Neutrophils Relative % 79 %   Neutro Abs 5.5 1.7 - 7.7 K/uL   Lymphocytes Relative 14 %   Lymphs Abs 0.9 0.7 - 4.0 K/uL   Monocytes Relative 7 %   Monocytes Absolute 0.5 0.1 - 1.0 K/uL   Eosinophils Relative 0 %   Eosinophils Absolute 0.0 0.0 - 0.5 K/uL   Basophils Relative 0 %   Basophils Absolute 0.0 0.0 - 0.1 K/uL   Immature Granulocytes 0 %   Abs Immature Granulocytes 0.02 0.00 - 0.07 K/uL    Comment: Performed at [facility], [address] [place], [place] [address]   CT ABDOMEN PELVIS W CONTRAST  Result Date: 03/21/2023 CLINICAL DATA:  72 year old female with abdominal pain. Pain at gastrostomy tube placed about 5 weeks ago. EXAM: CT ABDOMEN AND  PELVIS WITH CONTRAST TECHNIQUE: Multidetector CT imaging of the abdomen and pelvis was performed using the standard protocol following bolus administration of intravenous contrast. RADIATION DOSE REDUCTION: This exam was performed according to the departmental dose-optimization program which includes automated exposure control, adjustment of the mA and/or kV according to patient size and/or use of iterative reconstruction technique. CONTRAST:  OMNIPAQUE IOHEXOL 300 MG/ML  SOLN COMPARISON:  CT Abdomen and Pelvis 01/08/2023. FINDINGS: Lower chest: Heart size remains normal. No pericardial or pleural effusion. Increased patchy lung base opacity most resembling atelectasis. Hepatobiliary: Pneumoperitoneum. Small volume of perihepatic free fluid with simple fluid density. Hepatic steatosis. Absent gallbladder. Pancreas: Mild inflammation in the lesser sac appears related to the stomach rather than the pancreas. Negative pancreas otherwise. Spleen: Trace perisplenic free fluid. Adrenals/Urinary Tract: Normal adrenal glands. Nonobstructed kidneys. Chronic renal cysts (no follow-up imaging recommended). Absent renal contrast excretion on the delayed images. But the ureters are decompressed. Bladder is decompressed. Chronic pelvic phleboliths. Stomach/Bowel: Chronic Roux-en-Y type gastrojejunostomy with a superimposed percutaneous gastrostomy tube into the distal portion of the stomach. Abnormal pneumoperitoneum, moderate volume in the upper abdomen and inflammation surrounding the stomach and gastrojejunostomy (series 4, images 22 and 24). Furthermore, the balloon of the enteric tube is at the level of the abdominal wall, peritoneal lining on series 4, image 33. Mild regional inflammation. No fluid collection. The distal stomach and proximal duodenum also appear inflamed. Distal duodenum and ligament of Treitz have a more normal appearance. Downstream small bowel anastomosis on series 4, image 36 appears negative.  Superimposed large bowel is redundant but mostly decompressed. Mild secondary inflammation of the splenic flexure with regional pneumoperitoneum there. Negative right colon. Cecum on a lax mesentery series 4, image 51. Evidence of prior appendectomy.  Vascular/Lymphatic: Calcified aortic atherosclerosis. Major arterial structures in the abdomen and pelvis are patent. Normal caliber abdominal aorta. Portal venous system appears to be patent. No lymphadenopathy identified. Reproductive: Chronically absent uterus. Ovaries are within normal limits. Other: Small volume of simple density free fluid at the pelvic inlet on the right series 4, image 65. And small volume of what appears to be fluid tracking into and otherwise fat containing right inguinal hernia (series 4, image 78), new since September. Musculoskeletal: Advanced lumbar facet arthropathy. Stable visualized osseous structures. IMPRESSION: 1. Malpositioned percutaneous Gastrostomy Tube, with balloon pulled out through the ventral gastric wall and associated pneumoperitoneum and free fluid in the abdomen. The stomach, a chronic gastrojejunostomy, and the proximal duodenum all appear inflamed. But no drainable fluid collection, abscess. 2. Underlying chronic Roux-en-Y type gastric bypass. Small bowel distal to the gastrojejunostomy and the ligament of Treitz appears to remain normal. Mild secondary inflammation of the splenic flexure. 3. Small volume of free fluid in the pelvis, within a right inguinal hernia appears related to #1. 4. Hepatic steatosis. Lung base atelectasis. Aortic Atherosclerosis (ICD10-I70.0). Electronically Signed   By: Odessa Fleming M.D.   On: 03/21/2023 09:48      Assessment/Plan: Pneumoperitoneum and gastritis/duodenitis  Malpositioned G tube PMH roux-en-y gastric bypass 72 y/o F w/ hx robotic gastric bypass 09/14/22 who developed failure to thrive and subsequently underwent lap chole, laparoscopic g tube insertion. Normal EGD by Dr.  Dossie Der 01/25/23. She was discharged to SNF on 10/11. She presents from SNF with 3-4 days of progressive abdominal pain. CT scan w/ free air along with a malpositioned G tube. CT was reviewed with Dr. Lowella Dandy who favors the free air to be due to a marginal ulcer. The patient is currently hemodynamically stable without peritonitis. I have ordered a STAT UGI to further assess for possible perforated GJ ulcer. If there is contrast extravasation she will likely need to go to the OR for emergent graham patch of her perforated ulcer, along with G tube revision. If there is no extravasation or if there is a contained perforation, it may be reasonable to treat her non-operatively with bowel rest and IV antibiotics. If that is the case then IR will replaced her G tube under fluoroscopic guidance.  Admit to medical service due to MMP- pt with some ST depressions on telemetry being workup up by medicine with EKG/troponins. Pending this workup she may need [facility]. STAT UGI pending, if intra-abdominal free air is driving her tachycardia and soft BPs she may need surgery here at [facility] before she is considered stable for transfer.    IV protonix q 12h ordered IV abx ordered       I reviewed nursing notes, ED provider notes, hospitalist notes, last 24 h vitals and pain scores, last 48 h intake and output, last 24 h labs and trends, and last 24 h imaging results  HIGH MDM.  Adam Phenix, PA-C [facility] 03/21/2023, 11:20 AM Please see Amion for pager number during day hours 7:00am-4:30pm or 7:00am -11:30am on weekends

## 2023-03-21 NOTE — Procedures (Signed)
Interventional Radiology Procedure:   Indications: Pain at gastrostomy tube and gastrostomy appeared to be stuck in ventral abdominal wall  Procedure: Gastrostomy tube injection and manipulation under fluoroscopy  Findings: Gastrostomy was initially NOT moving within stomach.  After the tract was lubricated with viscous lidocaine, the tube popped into stomach and moved freely.  Contrast injection confirmed placement in stomach without a leak.  The balloon was deflated and re-inflated with 4 ml of saline.  Gastrostomy was working well at end of procedure and flushed with saline.   Complications: None     EBL: Minimal  Plan: Gastrostomy tube is ready to use.   Aveya Beal R. Lowella Dandy, MD  Pager: [phone]

## 2023-03-21 NOTE — H&P (Addendum)
History and Physical  Leigh-Ann Skender ZOX:[id number] DOB: 1950/07/06 DOA: 03/21/2023  PCP: Oneita Hurt, No   Chief Complaint: Abdominal pain  HPI: Earlyn Virrueta is a chronically ill 72 y.o. 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  Patient denies any fevers, says that she has been having some nausea and vomiting.  States that she has been having 3 meals a day orally for the last 3 weeks, has been doing well at rehab and starting to walk.  They have not been using the PEG tube at all in the last few weeks.  However in the last 3 days since she started having significant abdominal pain and vomiting, she has not been able to take much orally.  Denies any chest pain, palpitations, dizziness, fevers, chills.  States that her bowel movements have been pretty normal.  In the emergency department, CT scan was significant for the findings as detailed below.  ER provider discussed with general surgery who is consulting and has ordered upper GI series with contrast.  Review of Systems: Please see HPI for pertinent positives and negatives. A complete 10 system review of systems are otherwise negative.  Past Medical History:  Diagnosis Date   Allergy    dust, pollen, sulfa, prednisone   Anemia    Anxiety    Arthritis    "knees" (04/26/2016)   Colon polyps    benign per pt   Coronary artery disease    mild per 2015 cath in [place] (OM1 30%, RCA 30%)   GERD (gastroesophageal reflux disease)    Gout    History of hiatal hernia    Hyperlipidemia 05/20/2021   Hypertension    Migraine    "none since early /2017" (05/06/2016)   Obesity    OSA on CPAP    uses CPAP    Pre-diabetes    Pre-operative cardiovascular examination 08/22/2008   Renal insufficiency 11/09/2022   Vasculitis (HCC)    Bilateral   Past Surgical History:  Procedure Laterality Date   ABDOMINAL HYSTERECTOMY     "partial"; both ovaries present   APPENDECTOMY  05/06/2016   BIOPSY  04/08/2022   Procedure: BIOPSY;  Surgeon: Sherrilyn Rist, MD;  Location: [facility];  Service: Gastroenterology;;   CARDIAC CATHETERIZATION  ~ 2015   CARDIAC CATHETERIZATION  2015   In [place]   CHILECTOMY Right 06/01/2017   Procedure: CHILECTOMY RIGHT FOOT;  Surgeon: Felecia Shelling, DPM;  Location: [facility];  Service: Podiatry;  Laterality: Right;   CHOLECYSTECTOMY N/A 01/25/2023   Procedure: LAPAROSCOPIC CHOLECYSTECTOMY WITH INTRAOPERATIVE CHOLANGIOGRAM;  Surgeon: Quentin Ore, MD;  Location: [facility] OR;  Service: General;  Laterality: N/A;   ESOPHAGOGASTRODUODENOSCOPY N/A 01/25/2023   Procedure: ESOPHAGOGASTRODUODENOSCOPY (EGD);  Surgeon: Quentin Ore, MD;  Location: [facility];  Service: General;  Laterality: N/A;   ESOPHAGOGASTRODUODENOSCOPY (EGD) WITH PROPOFOL N/A 04/08/2022   Procedure: ESOPHAGOGASTRODUODENOSCOPY (EGD) WITH PROPOFOL;  Surgeon: Sherrilyn Rist, MD;  Location: [facility];  Service: Gastroenterology;  Laterality: N/A;   LAPAROSCOPIC APPENDECTOMY N/A 05/06/2016   Procedure: LAPAROSCOPIC APPENDECTOMY;  Surgeon: Manus Rudd, MD;  Location: [facility];  Service: General;  Laterality: N/A;   LAPAROSCOPIC INSERTION GASTROSTOMY TUBE N/A 01/25/2023   Procedure: LAPAROSCOPIC INSERTION REMNANT GASTROSTOMY  TUBE;  Surgeon: Quentin Ore, MD;  Location: [facility] OR;  Service: General;  Laterality: N/A;   POLYPECTOMY  04/08/2022   Procedure: POLYPECTOMY;  Surgeon: Sherrilyn Rist, MD;  Location: [facility];  Service: Gastroenterology;;   TONSILLECTOMY      Social History:  reports that she quit smoking about 44 years ago. Her smoking use included cigarettes. She started smoking about  49 years ago. She has a 0.6 pack-year smoking history. She has never used smokeless tobacco. She reports that she does not drink alcohol and does not use drugs.   Allergies  Allergen Reactions   Prednisone Swelling    Legs swell    Tape Hives   Sulfa Antibiotics Rash    Family History  Problem Relation Age of Onset   Diabetes Mother    Heart disease Mother    High blood pressure Mother    Asthma Mother    Arthritis Mother    Diabetes Father    Heart disease Father    High blood pressure Father    Asthma Father    Diabetes Sister    Alcohol abuse Brother    Drug abuse Brother    Arthritis Sister    Diabetes Sister    Varicose Veins Sister    Colon cancer Neg Hx    Esophageal cancer Neg Hx    Rectal cancer Neg Hx      Prior to Admission medications   Medication Sig Start Date End Date Taking? Authorizing Provider  acetaminophen (TYLENOL) 325 MG tablet Take 650 mg by mouth every 4 (four) hours as needed for mild pain or moderate pain.    [provider]  antiseptic oral rinse (BIOTENE) LIQD 15 mLs by Mouth Rinse route in the morning and at bedtime.    [provider]  ascorbic acid (VITAMIN C) 500 MG tablet Take 500 mg by mouth daily.    [provider]  cephALEXin (KEFLEX) 500 MG capsule Take 1 capsule (500 mg total) by mouth 3 (three) times daily. 02/24/23   Jacalyn Lefevre, MD  cholecalciferol (CHOLECALCIFEROL) 25 MCG tablet Place 5 tablets (5,000 Units total) into feeding tube daily. 02/04/23   Leatha Gilding, MD  colchicine 0.6 MG tablet Take 1.2 mg by mouth daily.    [provider]  diclofenac Sodium (VOLTAREN) 1 % GEL Apply 2 g topically 4 (four) times daily.    [provider]  DULoxetine (CYMBALTA) 60 MG capsule Take 1 capsule (60 mg total) by mouth daily. 12/25/21   [name], DO  feeding supplement (ENSURE ENLIVE / ENSURE PLUS) LIQD Place 237 mLs into feeding tube 3 (three) times daily between meals.  02/04/23   Leatha Gilding, MD  ferrous sulfate 325 (65 FE) MG EC tablet Take 325 mg by mouth daily with breakfast.    [provider]  folic acid (FOLVITE) 1 MG tablet Take 1 tablet (1 mg total) by mouth daily. 11/16/22   Azucena Fallen, MD  gabapentin (NEURONTIN) 100 MG capsule Take 1 capsule (100 mg total) by mouth 3 (three) times daily. 11/15/22   Azucena Fallen, MD  mirtazapine (REMERON SOL-TAB) 15 MG disintegrating tablet Place 0.5 tablets (7.5 mg total) into feeding tube at bedtime. 02/04/23   Leatha Gilding, MD  Multiple Vitamin (MULTIVITAMIN WITH MINERALS) TABS tablet Take 1 tablet by mouth daily. 11/16/22   Azucena Fallen, MD  Nutritional Supplements (FEEDING SUPPLEMENT, JEVITY 1.5 CAL/FIBER,) LIQD Place 1,000 mLs into feeding  tube daily. 02/04/23   Leatha Gilding, MD  Nutritional Supplements (PROMOD) LIQD Take 30 mLs by mouth 2 (two) times daily.    [provider]  omeprazole (PRILOSEC) 20 MG capsule Take 20 mg by mouth daily.    [provider]  oxybutynin (DITROPAN XL) 15 MG 24 hr tablet TAKE 1 TABLET BY MOUTH EVERY NIGHT AT BEDTIME 12/30/22   Wendling, Jilda Roche, DO  oxyCODONE (OXY IR/ROXICODONE) 5 MG immediate release tablet Place 1 tablet (5 mg total) into feeding tube every 6 (six) hours as needed for severe pain. 02/04/23   Leatha Gilding, MD  promethazine (PHENERGAN) 25 MG tablet Take 1 tablet (25 mg total) by mouth every 6 (six) hours as needed for nausea or vomiting. 10/27/22   [name], DO  Protein (FEEDING SUPPLEMENT, PROSOURCE TF20,) liquid Place 60 mLs into feeding tube daily. 02/04/23   Leatha Gilding, MD  senna (SENOKOT) 8.6 MG TABS tablet Take 1 tablet by mouth at bedtime.    [provider]  sodium chloride irrigation 0.9 % irrigation Irrigate with 75 mLs as directed See admin instructions. Use 75 mL/hr intravenously every shift for fluid resuscitation for 2 days    [provider]   thiamine (VITAMIN B-1) 100 MG tablet Place 1 tablet (100 mg total) into feeding tube daily. 02/04/23   Leatha Gilding, MD  traMADol (ULTRAM) 50 MG tablet Take 1 tablet (50 mg total) by mouth 3 (three) times daily as needed for moderate pain or severe pain. 02/04/23   Leatha Gilding, MD  zinc gluconate 50 MG tablet Take 50 mg by mouth daily.    [provider]    Physical Exam: BP (!) 99/55 (BP Location: Left Arm)   Pulse (!) 126   Temp 98.2 F (36.8 C) (Oral)   Resp (!) 22   SpO2 98%   General:  Alert, oriented, calm, in no acute distress, resting comfortably on room air Eyes: EOMI, clear conjuctivae, white sclerea Neck: supple, no masses, trachea mildline  Cardiovascular: RRR, no murmurs or rubs, no peripheral edema  Respiratory: clear to auscultation bilaterally, no wheezes, no crackles  Abdomen: soft, diffusely tender in the epigastrium, and around her G-tube site, somewhat distended Skin: dry, no rashes  Musculoskeletal: no joint effusions, normal range of motion  Psychiatric: appropriate affect, normal speech  Neurologic: extraocular muscles intact, clear speech, moving all extremities with intact sensorium         Labs on Admission:  Basic Metabolic Panel: Recent Labs  Lab 03/21/23 0715  NA 146*  K 3.9  CL 116*  CO2 20*  GLUCOSE 90  BUN 29*  CREATININE 1.08*  CALCIUM 8.9   Liver Function Tests: Recent Labs  Lab 03/21/23 0715  AST 33  ALT 20  ALKPHOS 108  BILITOT 0.8  PROT 6.9  ALBUMIN 2.7*   Recent Labs  Lab 03/21/23 0715  LIPASE 55*   No results for input(s): "AMMONIA" in the last 168 hours. CBC: Recent Labs  Lab 03/21/23 0715  WBC 6.9  NEUTROABS 5.5  HGB 11.1*  HCT 36.0  MCV 96.0  PLT 312   Cardiac Enzymes: No results for input(s): "CKTOTAL", "CKMB", "CKMBINDEX", "TROPONINI" in the last 168 hours.  BNP (last 3 results) No results for input(s): "BNP" in the last 8760 hours.  ProBNP (last 3 results) No results for  input(s): "PROBNP" in the last 8760 hours.  CBG: No results for input(s): "GLUCAP" in the last 168 hours.  Radiological Exams on Admission: CT ABDOMEN PELVIS W CONTRAST  Addendum Date: 03/21/2023   ADDENDUM REPORT: 03/21/2023 12:52 ADDENDUM: Small pockets of gas situated between the bowel and liver. Evidence for an ulceration near the GJ anastomosis on image 52/8 and image 50/8. Findings are suggestive for an anastomotic ulceration and likely the source for the free air. These results were communicated on 03/21/2023 at 12:49 pm to provider Hosie Spangle, who acknowledged these results. Electronically Signed   By: Richarda Overlie M.D.   On: 03/21/2023 12:52   Result Date: 03/21/2023 CLINICAL DATA:  72 year old female with abdominal pain. Pain at gastrostomy tube placed about 5 weeks ago. EXAM: CT ABDOMEN AND PELVIS WITH CONTRAST TECHNIQUE: Multidetector CT imaging of the abdomen and pelvis was performed using the standard protocol following bolus administration of intravenous contrast. RADIATION DOSE REDUCTION: This exam was performed according to the departmental dose-optimization program which includes automated exposure control, adjustment of the mA and/or kV according to patient size and/or use of iterative reconstruction technique. CONTRAST:  OMNIPAQUE IOHEXOL 300 MG/ML  SOLN COMPARISON:  CT Abdomen and Pelvis 01/08/2023. FINDINGS: Lower chest: Heart size remains normal. No pericardial or pleural effusion. Increased patchy lung base opacity most resembling atelectasis. Hepatobiliary: Pneumoperitoneum. Small volume of perihepatic free fluid with simple fluid density. Hepatic steatosis. Absent gallbladder. Pancreas: Mild inflammation in the lesser sac appears related to the stomach rather than the pancreas. Negative pancreas otherwise. Spleen: Trace perisplenic free fluid. Adrenals/Urinary Tract: Normal adrenal glands. Nonobstructed kidneys. Chronic renal cysts (no follow-up imaging recommended).  Absent renal contrast excretion on the delayed images. But the ureters are decompressed. Bladder is decompressed. Chronic pelvic phleboliths. Stomach/Bowel: Chronic Roux-en-Y type gastrojejunostomy with a superimposed percutaneous gastrostomy tube into the distal portion of the stomach. Abnormal pneumoperitoneum, moderate volume in the upper abdomen and inflammation surrounding the stomach and gastrojejunostomy (series 4, images 22 and 24). Furthermore, the balloon of the enteric tube is at the level of the abdominal wall, peritoneal lining on series 4, image 33. Mild regional inflammation. No fluid collection. The distal stomach and proximal duodenum also appear inflamed. Distal duodenum and ligament of Treitz have a more normal appearance. Downstream small bowel anastomosis on series 4, image 36 appears negative. Superimposed large bowel is redundant but mostly decompressed. Mild secondary inflammation of the splenic flexure with regional pneumoperitoneum there. Negative right colon. Cecum on a lax mesentery series 4, image 51. Evidence of prior appendectomy. Vascular/Lymphatic: Calcified aortic atherosclerosis. Major arterial structures in the abdomen and pelvis are patent. Normal caliber abdominal aorta. Portal venous system appears to be patent. No lymphadenopathy identified. Reproductive: Chronically absent uterus. Ovaries are within normal limits. Other: Small volume of simple density free fluid at the pelvic inlet on the right series 4, image 65. And small volume of what appears to be fluid tracking into and otherwise fat containing right inguinal hernia (series 4, image 78), new since September. Musculoskeletal: Advanced lumbar facet arthropathy. Stable visualized osseous structures. IMPRESSION: 1. Malpositioned percutaneous Gastrostomy Tube, with balloon pulled out through the ventral gastric wall and associated pneumoperitoneum and free fluid in the abdomen. The stomach, a chronic gastrojejunostomy, and  the proximal duodenum all appear inflamed. But no drainable fluid collection, abscess. 2. Underlying chronic Roux-en-Y type gastric bypass. Small bowel distal to the gastrojejunostomy and the ligament of Treitz appears to remain normal. Mild secondary inflammation of the splenic flexure. 3. Small volume of free fluid in the pelvis, within a right inguinal hernia appears related to #1. 4. Hepatic steatosis.  Lung base atelectasis. Aortic Atherosclerosis (ICD10-I70.0). Electronically Signed: By: Odessa Fleming M.D. On: 03/21/2023 09:48    Assessment/Plan Wessie Rowton is a chronically ill 72 y.o. 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   Intra-abdominal free air-due to G-tube displacement, versus GJ anastomosis ulceration.  Patient has been seen in consultation by general surgery, I discussed with their PA Marisue Ivan.  Radiology is now favoring likely ulceration. -Inpatient admission to progressive -Continuous telemetry -Keep n.p.o., including holding chronic oral medications -Stat upper GI series with contrast pending -Pain and nausea control as needed -Empiric IV Zosyn -Check lactate  New ST depression-seen diffusely on EKG, no when compared to EKG from last month.  Discussed with cardiology on-call, they feel this is a new left bundle.  Patient with persistent tachycardia which may be due to her intra-abdominal process.  She denies any chest pain, palpitations, dizziness or other cardiac symptoms.  She may need emergent surgery today pending findings of the above study. -Cardiology recommends continued monitoring, likely no role to trend troponin as may be elevated due to her  tachycardia and intra-abdominal process.  Could consider echo and cardiology follow-up once she is out of her acute situation  GERD-IV Protonix  Hyponatremia-I feel this is likely due to dehydration from reduced oral intake the last 3 days since she has been having abdominal pain.  Aggressive fluid resuscitation with normal saline, will trend sodium levels with morning labs.  Chronic iron deficiency anemia-appears to be at baseline  Patient is hypotensive and tachycardic, I feel this is likely due to urgent abdominal process, not acute infection/sepsis.  However will check lactate, continue aggressive fluid resuscitation due to her hypotension and tachycardia.  DVT prophylaxis: Lovenox     Code Status: Limited: Do not attempt resuscitation (DNR) -DNR-LIMITED -Do Not Intubate/DNI patient with goldenrod form, she confirms DNR/DNI status.  Consults called: General Surgery, cardiology  Admission status: The appropriate patient status for this patient is INPATIENT. Inpatient status is judged to be reasonable and necessary in order to provide the required intensity of service to ensure the patient's safety. The patient's presenting symptoms, physical exam findings, and initial radiographic and laboratory data in the context of their chronic comorbidities is felt to place them at high risk for further clinical deterioration. Furthermore, it is not anticipated that the patient will be medically stable for discharge from the hospital within 2 midnights of admission.    I certify that at the point of admission it is my clinical judgment that the patient will require inpatient hospital care spanning beyond 2 midnights from the point of admission due to high intensity of service, high risk for further deterioration and high frequency of surveillance required  Time spent: 65 minutes  Netanya Yazdani Sharlette Dense MD [facility] Pager [phone]  If 7PM-7AM, please contact  night-coverage [email or web address] Password [email or web address]  03/21/2023, 1:18 PM

## 2023-03-21 NOTE — Interval H&P Note (Signed)
History and Physical Interval Note:  03/21/2023 5:15 PM  Christine [name]  has presented today for surgery, with the diagnosis of perforated gastrojejunal ulcer.  The various methods of treatment have been discussed with the patient and family. After consideration of risks, benefits and other options for treatment, the patient has consented to  Procedure(s): EXPLORATORY LAPAROTOMY (N/A) as a surgical intervention.  The patient's history has been reviewed. She has free air on CT scan, consistent with likely perforated marginal ulcer. She is tachycardic with significant abdominal tenderness. Proceed to OR for exploratory laparotomy and likely [facility]. I reviewed the details of the planned procedure with the patient and she agrees to proceed with surgery.   Fritzi Mandes

## 2023-03-22 ENCOUNTER — Encounter (HOSPITAL_COMMUNITY): Payer: Self-pay | Admitting: Surgery

## 2023-03-22 DIAGNOSIS — K631 Perforation of intestine (nontraumatic): Secondary | ICD-10-CM | POA: Diagnosis not present

## 2023-03-22 DIAGNOSIS — E870 Hyperosmolality and hypernatremia: Secondary | ICD-10-CM | POA: Diagnosis not present

## 2023-03-22 DIAGNOSIS — D649 Anemia, unspecified: Secondary | ICD-10-CM | POA: Diagnosis not present

## 2023-03-22 DIAGNOSIS — E872 Acidosis, unspecified: Secondary | ICD-10-CM | POA: Diagnosis present

## 2023-03-22 DIAGNOSIS — I959 Hypotension, unspecified: Secondary | ICD-10-CM | POA: Diagnosis present

## 2023-03-22 DIAGNOSIS — N179 Acute kidney failure, unspecified: Secondary | ICD-10-CM | POA: Diagnosis not present

## 2023-03-22 LAB — BASIC METABOLIC PANEL
Anion gap: 10 (ref 5–15)
BUN: 32 mg/dL — ABNORMAL HIGH (ref 8–23)
CO2: 17 mmol/L — ABNORMAL LOW (ref 22–32)
Calcium: 7.8 mg/dL — ABNORMAL LOW (ref 8.9–10.3)
Chloride: 118 mmol/L — ABNORMAL HIGH (ref 98–111)
Creatinine, Ser: 2.04 mg/dL — ABNORMAL HIGH (ref 0.44–1.00)
GFR, Estimated: 26 mL/min — ABNORMAL LOW (ref >60)
Glucose, Bld: 79 mg/dL (ref 70–99)
Potassium: 4.1 mmol/L (ref 3.5–5.1)
Sodium: 145 mmol/L (ref 135–145)

## 2023-03-22 LAB — CBC
HCT: 33.5 % — ABNORMAL LOW (ref 36.0–46.0)
HCT: 31.7 % — ABNORMAL LOW (ref 36.0–46.0)
Hemoglobin: 9.7 g/dL — ABNORMAL LOW (ref 12.0–15.0)
Hemoglobin: 9.4 g/dL — ABNORMAL LOW (ref 12.0–15.0)
MCH: 29.3 pg (ref 26.0–34.0)
MCH: 29.4 pg (ref 26.0–34.0)
MCHC: 29 g/dL — ABNORMAL LOW (ref 30.0–36.0)
MCHC: 29.7 g/dL — ABNORMAL LOW (ref 30.0–36.0)
MCV: 101.2 fL — ABNORMAL HIGH (ref 80.0–100.0)
MCV: 99.1 fL (ref 80.0–100.0)
Platelets: 265 10*3/uL (ref 150–400)
Platelets: 290 10*3/uL (ref 150–400)
RBC: 3.31 MIL/uL — ABNORMAL LOW (ref 3.87–5.11)
RBC: 3.2 MIL/uL — ABNORMAL LOW (ref 3.87–5.11)
RDW: 18.4 % — ABNORMAL HIGH (ref 11.5–15.5)
RDW: 18.5 % — ABNORMAL HIGH (ref 11.5–15.5)
WBC: 14.3 10*3/uL — ABNORMAL HIGH (ref 4.0–10.5)
WBC: 13.6 10*3/uL — ABNORMAL HIGH (ref 4.0–10.5)
nRBC: 0 % (ref 0.0–0.2)
nRBC: 0 % (ref 0.0–0.2)

## 2023-03-22 LAB — GLUCOSE, CAPILLARY
Glucose-Capillary: 51 mg/dL — ABNORMAL LOW (ref 70–99)
Glucose-Capillary: 60 mg/dL — ABNORMAL LOW (ref 70–99)
Glucose-Capillary: 62 mg/dL — ABNORMAL LOW (ref 70–99)
Glucose-Capillary: 68 mg/dL — ABNORMAL LOW (ref 70–99)
Glucose-Capillary: 76 mg/dL (ref 70–99)
Glucose-Capillary: 79 mg/dL (ref 70–99)
Glucose-Capillary: 89 mg/dL (ref 70–99)
Glucose-Capillary: 92 mg/dL (ref 70–99)
Glucose-Capillary: 94 mg/dL (ref 70–99)

## 2023-03-22 LAB — URINALYSIS, COMPLETE (UACMP) WITH MICROSCOPIC
Bilirubin Urine: NEGATIVE
Glucose, UA: NEGATIVE mg/dL
Ketones, ur: NEGATIVE mg/dL
Nitrite: NEGATIVE
Protein, ur: 30 mg/dL — AB
Specific Gravity, Urine: >1.046 — ABNORMAL HIGH (ref 1.005–1.030)
WBC, UA: >50 WBC/hpf (ref 0–5)
pH: 5 (ref 5.0–8.0)

## 2023-03-22 LAB — VITAMIN B12: Vitamin B-12: 1005 pg/mL — ABNORMAL HIGH (ref 180–914)

## 2023-03-22 LAB — IRON AND TIBC
Iron: 11 ug/dL — ABNORMAL LOW (ref 28–170)
Saturation Ratios: 9 % — ABNORMAL LOW (ref 10.4–31.8)
TIBC: 127 ug/dL — ABNORMAL LOW (ref 250–450)
UIBC: 116 ug/dL

## 2023-03-22 LAB — CREATININE, URINE, RANDOM: Creatinine, Urine: 129 mg/dL

## 2023-03-22 LAB — SODIUM, URINE, RANDOM: Sodium, Ur: <10 mmol/L

## 2023-03-22 LAB — FERRITIN: Ferritin: 229 ng/mL (ref 11–307)

## 2023-03-22 LAB — FOLATE: Folate: 19.2 ng/mL (ref >5.9)

## 2023-03-22 MED ORDER — CHLORHEXIDINE GLUCONATE CLOTH 2 % EX PADS
6.0000 | MEDICATED_PAD | Freq: Every day | CUTANEOUS | Status: DC
  Administered 2023-03-23 – 2023-04-05 (×12): 6 via TOPICAL

## 2023-03-22 MED ORDER — DEXTROSE 50 % IV SOLN
INTRAVENOUS | Status: AC
  Filled 2023-03-22: qty 50

## 2023-03-22 MED ORDER — LACTATED RINGERS IV BOLUS
2000.0000 mL | Freq: Once | INTRAVENOUS | Status: AC
  Administered 2023-03-22: 2000 mL via INTRAVENOUS

## 2023-03-22 MED ORDER — DEXTROSE-SODIUM CHLORIDE 5-0.9 % IV SOLN: INTRAVENOUS | Status: DC

## 2023-03-22 MED ORDER — SODIUM BICARBONATE 8.4 % IV SOLN
INTRAVENOUS | Status: DC
  Filled 2023-03-22 (×2): qty 150
  Filled 2023-03-22: qty 1000

## 2023-03-22 MED ORDER — SODIUM CHLORIDE 0.9 % IV BOLUS
500.0000 mL | Freq: Once | INTRAVENOUS | Status: AC
  Administered 2023-03-22: 500 mL via INTRAVENOUS

## 2023-03-22 MED ORDER — DEXTROSE 50 % IV SOLN
25.0000 g | INTRAVENOUS | Status: AC
  Administered 2023-03-22: 25 g via INTRAVENOUS

## 2023-03-22 MED ORDER — DEXTROSE 50 % IV SOLN
12.5000 g | INTRAVENOUS | Status: AC
  Administered 2023-03-22: 12.5 g via INTRAVENOUS
  Filled 2023-03-22: qty 50

## 2023-03-22 NOTE — Progress Notes (Signed)
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  Patient also noted to have significant abdominal pain and vomiting for 3 days prior to admission. -Patient seen in consultation by general surgery and patient underwent exploratory laparotomy, abdominal washout, Graham patch repair of perforated gastrojejunal ulcer, intraperitoneal drain placement.  Patient subsequently admitted to the stepdown unit for closer monitoring.  Noted to be hypotensive and also in acute renal failure.   Assessment & Plan:   Principal Problem:   Bowel perforation (HCC) Active Problems:   Normocytic anemia   GAD (generalized anxiety disorder)   Anxiety   Hyperlipidemia   Hypernatremia   ARF (acute renal failure) (HCC)   Hypotension   Metabolic acidosis   #1 perforated marginal ulcer/intra-abdominal free air -Felt secondary to G-tube displacement versus GJ anastomosis ulceration. -Patient seen in consultation by general surgery patient subsequently underwent expiratory laparotomy, abdominal washout, Graham patch repair of perforated gastrojejunal ulcer, intraperitoneal drain placement. -Currently n.p.o., IV fluids. -Continue IV Zosyn, PPI. -Gastrostomy tube noted to be repositioned preoperatively per IR. -Per general surgery.  2.  Hypotension -Likely secondary to problem #1. -BP responded to IV fluids. -Urine cultures pending. -Patient afebrile,  leukocytosis. -Check blood cultures x 2. -IV fluids.  3.  Acute renal failure -Patient noted an acute renal failure with creatinine up to 2.04 today from 1.08 on admission. -Felt likely secondary to prerenal azotemia as patient noted to be hypotensive. -Urinalysis done this morning with large leukocytes, nitrite negative, specific gravity > 1.046, rare bacteria, WBC > 50 -Urine sodium < 10, urine creatinine 129. -2 L LR bolus. -Placed on bicarb drip. -Monitor urine output. -Repeat labs in the AM. -If renal function worsens will need to consult nephrology for further evaluation and management.  4.  Metabolic acidosis -Like secondary to problem #1 and acute renal failure. -Placed on bicarb drip.  5.  Probable UTI -Check urine cultures. -Can need IV Zosyn.  6.  New ST depression -Noted diffusely on admitting EKG with no prior EKG to compare to. -Admitting physician discussed with cardiology on-call feel may be a new left bundle branch block. -Per admitting physician is noted that patient with persistent tachycardia which may be due to intra-abdominal process. -Patient underwent emergent surgery on presentation. -Per admitting physician cardiology recommended continued monitoring, no overall to trend troponins which may be elevated due to tachycardia intra-abdominal process. -Will check a 2D echo and may need outpatient follow-up with cardiology.  7.  GERD -IV PPI.  8.  Hypernatremia -Likely secondary to dehydration as patient noted to be hypotensive and secondary to problem #1. -Urine sodium < 10. -Improving with hydration. -Follow-up.  9.  Anemia -Check an anemia panel. -Follow H&H. -Transfusion threshold hemoglobin < 8.   DVT prophylaxis: Lovenox Code Status: DNR Family Communication: Updated patient.  No family at bedside. Disposition: TBD  Status is: Inpatient Remains inpatient appropriate because: Severity of illness   Consultants:  IR General Surgery:  Dr.Gerkin 03/21/2023  Procedures:  CT abdomen and  pelvis 03/21/2023 Gastrostomy tube injection manipulation with fluoroscopy per IR: Dr. Lowella Dandy 03/21/2023 Exploratory laparotomy, abdominal washout, [name] patch repair of perforated gastrojejunal ulcer, intraperitoneal drain placement per general surgery: Dr. Freida Busman 03/21/2023  Antimicrobials:  Anti-infectives (From admission, onward)    Start     Dose/Rate Route Frequency Ordered Stop   03/21/23 1330  piperacillin-tazobactam (ZOSYN) IVPB 3.375 g        3.375 g 12.5 mL/hr over 240 Minutes Intravenous Every 8 hours 03/21/23 1325           Subjective: Patient sleeping but arousable.  Patient alert to self only.  Confused.  Noted to have received Robaxin earlier on.  Denies any chest pain, denies any abdominal pain.  Keeps stating she feels good.  Objective: Vitals:   03/22/23 1600 03/22/23 1700 03/22/23 1708 03/22/23 1800  BP: (!) 146/51  (!) 98/41 (!) 135/42  Pulse: (!) 115  (!) 109 (!) 101  Resp: (!) 23  17 13   Temp: 98.7 F (37.1 C) 98.6 F (37 C)    TempSrc: Oral Oral    SpO2: 92%  95% 95%    Intake/Output Summary (Last 24 hours) at 03/22/2023 1829 Last data filed at 03/22/2023 1800 Gross per 24 hour  Intake 6753.41 ml  Output 330 ml  Net 6423.41 ml   There were no vitals filed for this visit.  Examination:  General exam: NAD. Respiratory system: Clear to auscultation anterior lung fields. Respiratory effort normal. Cardiovascular system: RRR no murmurs rubs or gallops.  No JVD.  No lower extremity edema.   Gastrointestinal system: Abdomen is soft, nondistended, some diffuse tenderness to palpation. Postop mid abdominal dressing in place.  Right lower quadrant JP drain with sanguinous drainage.  G-tube intact.  No organomegaly or masses felt.  Hypoactive bowel sounds. Central nervous system: Alert to self only.  Moving extremities spontaneously.. No focal neurological deficits. Extremities: Symmetric 5 x 5  power. Skin: No rashes, lesions or ulcers Psychiatry: Judgement and insight appear poor. Mood & affect appropriate.     Data Reviewed: I have personally reviewed following labs and imaging studies  CBC: Recent Labs  Lab 03/21/23 0715 03/22/23 0316 03/22/23 1037  WBC 6.9 14.3* 13.6*  NEUTROABS 5.5  --   --   HGB 11.1* 9.7* 9.4*  HCT 36.0 33.5* 31.7*  MCV 96.0 101.2* 99.1  PLT 312 265 290    Basic Metabolic Panel: Recent Labs  Lab 03/21/23 0715 03/22/23 0316  NA 146* 145  K 3.9 4.1  CL 116* 118*  CO2 20* 17*  GLUCOSE 90 79  BUN 29* 32*  CREATININE 1.08* 2.04*  CALCIUM 8.9 7.8*    GFR: CrCl cannot be calculated (Unknown ideal weight.).  Liver Function Tests: Recent Labs  Lab 03/21/23 0715  AST 33  ALT 20  ALKPHOS 108  BILITOT 0.8  PROT 6.9  ALBUMIN 2.7*    CBG: Recent Labs  Lab 03/22/23 0259 03/22/23 0834 03/22/23 0928 03/22/23 1148 03/22/23 1639  GLUCAP 68* 62* 92 76 79     Recent Results (from the past 240 hour(s))  MRSA Next Gen by PCR, Nasal     Status: None   Collection Time: 03/21/23  8:21 PM   Specimen: Nasal Mucosa; Nasal Swab  Result Value Ref Range Status   MRSA by PCR Next Gen NOT DETECTED NOT DETECTED Final    Comment: (NOTE) The GeneXpert MRSA Assay (FDA approved for NASAL specimens only), is one component of a comprehensive MRSA colonization surveillance program. It  is not intended to diagnose MRSA infection nor to guide or monitor treatment for MRSA infections. Test performance is not FDA approved in patients less than 62 years old. Performed at [facility], [address], [place], [place] [address]          Radiology Studies: DG Fluoro Rm 1-60 Min  Result Date: 03/22/2023 INDICATION: Evaluate gastrostomy tube. Gastrostomy tube appeared to be malpositioned on CT. EXAM: FLUOROSCOPY TIME FOR G-TUBE INJECTION MEDICATIONS: None ANESTHESIA/SEDATION: None CONTRAST:  10 mL-administered into the gastric  lumen. FLUOROSCOPY: Fluoroscopy time was included in the upper GI examination. COMPLICATIONS: None immediate. PROCEDURE: This procedure was performed immediately following the upper GI procedure while the patient was still in the fluoroscopy room. The gastrostomy tube was injected with contrast. Gastrostomy tube was flushed with water after the injection. FINDINGS: Gastrostomy tube injection demonstrates filling of the stomach. There was no evidence for contrast extravasation or leak. Gastrostomy tube flushed easily. IMPRESSION: Gastrostomy tube communicates with the stomach. No evidence for a leak. Electronically Signed   By: Richarda Overlie M.D.   On: 03/22/2023 11:30   IR GASTROSTOMY TUBE REMOVAL/REPAIR  Result Date: 03/21/2023 INDICATION: 72 year old with pneumoperitoneum and leak near the gastrojejunostomy anastomosis. In addition, the patient has pain at the gastrostomy tube site and CT demonstrates that the gastrostomy tube balloon is partially within the ventral abdominal wall. EXAM: 1. Gastrostomy tube injection and manipulation with fluoroscopy MEDICATIONS: Viscous lidocaine ANESTHESIA/SEDATION: None CONTRAST:  25 mL Omnipaque 300-administered into the gastric lumen. FLUOROSCOPY: Radiation Exposure Index (as provided by the fluoroscopic device): 56 mGy Kerma COMPLICATIONS: None immediate. PROCEDURE: Informed consent was obtained for gastrostomy tube injection and exchange. The gastrostomy tube was initially injected with contrast immediately following the upper GI while the patient was still on the fluoroscopy table. The gastrostomy tube injection confirmed filling of the stomach without a leak. However, the gastrostomy tube would not move freely within the stomach suggesting that the balloon was stuck within the ventral abdominal wall. Therefore, the patient was transferred to Interventional Radiology for exchange or manipulation of the gastrostomy tube. Patient was brought into the interventional  radiology suite. The anterior abdomen was prepped and draped in sterile fashion. Maximal barrier sterile technique was utilized including caps, mask, sterile gowns, sterile gloves, sterile drape, hand hygiene and skin antiseptic. Viscous lidocaine was placed along the gastrostomy tube tract. Following the administration of the viscous lidocaine, the gastrostomy tube balloon advanced into the stomach lumen and contrast injection confirmed placement within the gastric lumen. The balloon was deflated. The balloon was re-inflated with 4 mL of saline. Additional contrast was injected to confirm placement in the stomach. No evidence for a leak. FINDINGS: The gastrostomy tube balloon appeared to be stuck within the ventral abdominal wall but the gastrostomy tube was still communicating with the gastric lumen and there was no evidence for a leak. The balloon was advanced back into the stomach lumen after viscous lidocaine was injected around the tube. At the end of the procedure, the gastrostomy tube was well positioned within the stomach. Currently, there is 4 mL of saline within the gastrostomy tube retention balloon. IMPRESSION: Successful repositioning of the gastrostomy tube balloon. Contrast injection confirmed placement in the stomach. No evidence for a leak at the gastrostomy tube site. Electronically Signed   By: Richarda Overlie M.D.   On: 03/21/2023 16:18   DG UGI W SINGLE CM (SOL OR THIN BA)  Result Date: 03/21/2023 CLINICAL DATA:  Gastric bypass with suspected leak  on CT abdomen. EXAM: WATER SOLUBLE UPPER GI SERIES TECHNIQUE: Single-column upper GI series was performed using water soluble contrast. Radiation Exposure Index (as provided by the fluoroscopic device): 69.8 mGy Kerma CONTRAST:  50 cc Omnipaque 300. COMPARISON:  CT abdomen pelvis 03/21/2023. FLUOROSCOPY: Fluoroscopy Time: Radiation Exposure Index (if provided by the fluoroscopic device): 69.8 Number of Acquired Spot Images: 8 FINDINGS: Scout view of  the abdomen shows postoperative changes in the left upper quadrant related to gastric bypass. Surgical clips in the right upper quadrant. Mild gaseous distension of colon. Residual excreted contrast in the intrarenal collecting systems and bladder from CT earlier today. Patient was imaged in the 45 degree recumbent AP, LPO and RPO positions. Initially, there is filling of the gastric pouch with reflux of contrast into the esophagus. Subsequent imaging shows leakage of contrast from the distal gastric pouch, in the region of the anastomosis. IMPRESSION: Gastric bypass with a gastrojejunostomy anastomotic leak. Electronically Signed   By: Leanna Battles M.D.   On: 03/21/2023 15:24   CT ABDOMEN PELVIS W CONTRAST  Addendum Date: 03/21/2023   ADDENDUM REPORT: 03/21/2023 12:52 ADDENDUM: Small pockets of gas situated between the bowel and liver. Evidence for an ulceration near the GJ anastomosis on image 52/8 and image 50/8. Findings are suggestive for an anastomotic ulceration and likely the source for the free air. These results were communicated on 03/21/2023 at 12:49 pm to provider Hosie Spangle, who acknowledged these results. Electronically Signed   By: Richarda Overlie M.D.   On: 03/21/2023 12:52   Result Date: 03/21/2023 CLINICAL DATA:  72 year old female with abdominal pain. Pain at gastrostomy tube placed about 5 weeks ago. EXAM: CT ABDOMEN AND PELVIS WITH CONTRAST TECHNIQUE: Multidetector CT imaging of the abdomen and pelvis was performed using the standard protocol following bolus administration of intravenous contrast. RADIATION DOSE REDUCTION: This exam was performed according to the departmental dose-optimization program which includes automated exposure control, adjustment of the mA and/or kV according to patient size and/or use of iterative reconstruction technique. CONTRAST:  OMNIPAQUE IOHEXOL 300 MG/ML  SOLN COMPARISON:  CT Abdomen and Pelvis 01/08/2023. FINDINGS: Lower chest: Heart size  remains normal. No pericardial or pleural effusion. Increased patchy lung base opacity most resembling atelectasis. Hepatobiliary: Pneumoperitoneum. Small volume of perihepatic free fluid with simple fluid density. Hepatic steatosis. Absent gallbladder. Pancreas: Mild inflammation in the lesser sac appears related to the stomach rather than the pancreas. Negative pancreas otherwise. Spleen: Trace perisplenic free fluid. Adrenals/Urinary Tract: Normal adrenal glands. Nonobstructed kidneys. Chronic renal cysts (no follow-up imaging recommended). Absent renal contrast excretion on the delayed images. But the ureters are decompressed. Bladder is decompressed. Chronic pelvic phleboliths. Stomach/Bowel: Chronic Roux-en-Y type gastrojejunostomy with a superimposed percutaneous gastrostomy tube into the distal portion of the stomach. Abnormal pneumoperitoneum, moderate volume in the upper abdomen and inflammation surrounding the stomach and gastrojejunostomy (series 4, images 22 and 24). Furthermore, the balloon of the enteric tube is at the level of the abdominal wall, peritoneal lining on series 4, image 33. Mild regional inflammation. No fluid collection. The distal stomach and proximal duodenum also appear inflamed. Distal duodenum and ligament of Treitz have a more normal appearance. Downstream small bowel anastomosis on series 4, image 36 appears negative. Superimposed large bowel is redundant but mostly decompressed. Mild secondary inflammation of the splenic flexure with regional pneumoperitoneum there. Negative right colon. Cecum on a lax mesentery series 4, image 51. Evidence of prior appendectomy. Vascular/Lymphatic: Calcified aortic atherosclerosis. Major arterial structures in the abdomen  and pelvis are patent. Normal caliber abdominal aorta. Portal venous system appears to be patent. No lymphadenopathy identified. Reproductive: Chronically absent uterus. Ovaries are within normal limits. Other: Small volume of  simple density free fluid at the pelvic inlet on the right series 4, image 65. And small volume of what appears to be fluid tracking into and otherwise fat containing right inguinal hernia (series 4, image 78), new since September. Musculoskeletal: Advanced lumbar facet arthropathy. Stable visualized osseous structures. IMPRESSION: 1. Malpositioned percutaneous Gastrostomy Tube, with balloon pulled out through the ventral gastric wall and associated pneumoperitoneum and free fluid in the abdomen. The stomach, a chronic gastrojejunostomy, and the proximal duodenum all appear inflamed. But no drainable fluid collection, abscess. 2. Underlying chronic Roux-en-Y type gastric bypass. Small bowel distal to the gastrojejunostomy and the ligament of Treitz appears to remain normal. Mild secondary inflammation of the splenic flexure. 3. Small volume of free fluid in the pelvis, within a right inguinal hernia appears related to #1. 4. Hepatic steatosis. Lung base atelectasis. Aortic Atherosclerosis (ICD10-I70.0). Electronically Signed: By: Odessa Fleming M.D. On: 03/21/2023 09:48        Scheduled Meds:  Chlorhexidine Gluconate Cloth  6 each Topical Daily   enoxaparin (LOVENOX) injection  40 mg Subcutaneous Q24H   methocarbamol (ROBAXIN) injection  500 mg Intravenous Q8H   mouth rinse  15 mL Mouth Rinse 4 times per day   pantoprazole (PROTONIX) IV  40 mg Intravenous Q8H   Followed by   Melene Muller ON 03/25/2023] pantoprazole (PROTONIX) IV  40 mg Intravenous Q12H   Continuous Infusions:  piperacillin-tazobactam (ZOSYN)  IV 12.5 mL/hr at 03/22/23 1800   sodium bicarbonate 150 mEq in dextrose 5 % 1,150 mL infusion 125 mL/hr at 03/22/23 1800     LOS: 1 day    Time spent: 40 minutes    Ramiro Harvest, MD [facility]   To contact the attending provider between 7A-7P or the covering provider during after hours 7P-7A, please log into the web site [email or web address] and access using universal [place] password  for that web site. If you do not have the password, please call the hospital operator.  03/22/2023, 6:29 PM

## 2023-03-22 NOTE — Progress Notes (Addendum)
Assessment & Plan: Perforated marginal ulcer - POD#1 - status post ex lap, abdominal washout, Graham patch repair of perforated gastrojejunal ulcer, drain placement - 03/21/2023 Dr. Freida Busman - NPO, IVF - IV Zosyn - PPI - monitor drain output - OOB, mobilize - appreciate TRH management - gastrostomy tube re-positioned pre-op by IR        [name], MD [facility] practice Office: [phone]        Chief Complaint: Perforated marginal ulcer  Subjective: Patient in stepdown unit, mild confusion  Objective: Vital signs in last 24 hours: Temp:  [97.5 F (36.4 C)-98.6 F (37 C)] 97.6 F (36.4 C) (11/26 0520) Pulse Rate:  [111-135] 113 (11/26 0300) Resp:  [8-29] 12 (11/26 0700) BP: (66-150)/(29-109) 103/46 (11/26 0700) SpO2:  [94 %-100 %] 100 % (11/26 0300) Last BM Date :  (PTA)  Intake/Output from previous day: 11/25 0701 - 11/26 0700 In: 5623.2 [I.V.:2430.9; IV Piggyback:2192.3] Out: 205 [Urine:125; Drains:80] Intake/Output this shift: No intake/output data recorded.  Physical Exam:  Abdomen - soft, dressings dry and intact; Gtube LUQ plugged; JP with moderate serosanguinous   Lab Results:  Recent Labs    03/21/23 0715 03/22/23 0316  WBC 6.9 14.3*  HGB 11.1* 9.7*  HCT 36.0 33.5*  PLT 312 265   BMET Recent Labs    03/21/23 0715 03/22/23 0316  NA 146* 145  K 3.9 4.1  CL 116* 118*  CO2 20* 17*  GLUCOSE 90 79  BUN 29* 32*  CREATININE 1.08* 2.04*  CALCIUM 8.9 7.8*   PT/INR No results for input(s): "LABPROT", "INR" in the last 72 hours. Comprehensive Metabolic Panel:    Component Value Date/Time   NA 145 03/22/2023 0316   NA 146 (H) 03/21/2023 0715   K 4.1 03/22/2023 0316   K 3.9 03/21/2023 0715   CL 118 (H) 03/22/2023 0316   CL 116 (H) 03/21/2023 0715   CO2 17 (L) 03/22/2023 0316   CO2 20 (L) 03/21/2023 0715   BUN 32 (H) 03/22/2023 0316   BUN 29 (H) 03/21/2023 0715   CREATININE 2.04 (H) 03/22/2023 0316    CREATININE 1.08 (H) 03/21/2023 0715   CREATININE 0.91 07/26/2017 1127   GLUCOSE 79 03/22/2023 0316   GLUCOSE 90 03/21/2023 0715   CALCIUM 7.8 (L) 03/22/2023 0316   CALCIUM 8.9 03/21/2023 0715   AST 33 03/21/2023 0715   AST 25 02/24/2023 1529   AST 15 07/26/2017 1127   ALT 20 03/21/2023 0715   ALT 21 02/24/2023 1529   ALT 14 07/26/2017 1127   ALKPHOS 108 03/21/2023 0715   ALKPHOS 76 02/24/2023 1529   BILITOT 0.8 03/21/2023 0715   BILITOT 0.8 02/24/2023 1529   BILITOT 0.5 07/26/2017 1127   PROT 6.9 03/21/2023 0715   PROT 6.3 (L) 02/24/2023 1529   ALBUMIN 2.7 (L) 03/21/2023 0715   ALBUMIN 2.7 (L) 02/24/2023 1529    Studies/Results: IR GASTROSTOMY TUBE REMOVAL/REPAIR  Result Date: 03/21/2023 INDICATION: 72 year old with pneumoperitoneum and leak near the gastrojejunostomy anastomosis. In addition, the patient has pain at the gastrostomy tube site and CT demonstrates that the gastrostomy tube balloon is partially within the ventral abdominal wall. EXAM: 1. Gastrostomy tube injection and manipulation with fluoroscopy MEDICATIONS: Viscous lidocaine ANESTHESIA/SEDATION: None CONTRAST:  25 mL Omnipaque 300-administered into the gastric lumen. FLUOROSCOPY: Radiation Exposure Index (as provided by the fluoroscopic device): 56 mGy Kerma COMPLICATIONS: None immediate. PROCEDURE: Informed consent was obtained for gastrostomy tube injection and exchange. The gastrostomy tube was  initially injected with contrast immediately following the upper GI while the patient was still on the fluoroscopy table. The gastrostomy tube injection confirmed filling of the stomach without a leak. However, the gastrostomy tube would not move freely within the stomach suggesting that the balloon was stuck within the ventral abdominal wall. Therefore, the patient was transferred to Interventional Radiology for exchange or manipulation of the gastrostomy tube. Patient was brought into the interventional radiology suite. The  anterior abdomen was prepped and draped in sterile fashion. Maximal barrier sterile technique was utilized including caps, mask, sterile gowns, sterile gloves, sterile drape, hand hygiene and skin antiseptic. Viscous lidocaine was placed along the gastrostomy tube tract. Following the administration of the viscous lidocaine, the gastrostomy tube balloon advanced into the stomach lumen and contrast injection confirmed placement within the gastric lumen. The balloon was deflated. The balloon was re-inflated with 4 mL of saline. Additional contrast was injected to confirm placement in the stomach. No evidence for a leak. FINDINGS: The gastrostomy tube balloon appeared to be stuck within the ventral abdominal wall but the gastrostomy tube was still communicating with the gastric lumen and there was no evidence for a leak. The balloon was advanced back into the stomach lumen after viscous lidocaine was injected around the tube. At the end of the procedure, the gastrostomy tube was well positioned within the stomach. Currently, there is 4 mL of saline within the gastrostomy tube retention balloon. IMPRESSION: Successful repositioning of the gastrostomy tube balloon. Contrast injection confirmed placement in the stomach. No evidence for a leak at the gastrostomy tube site. Electronically Signed   By: Richarda Overlie M.D.   On: 03/21/2023 16:18   DG UGI W SINGLE CM (SOL OR THIN BA)  Result Date: 03/21/2023 CLINICAL DATA:  Gastric bypass with suspected leak on CT abdomen. EXAM: WATER SOLUBLE UPPER GI SERIES TECHNIQUE: Single-column upper GI series was performed using water soluble contrast. Radiation Exposure Index (as provided by the fluoroscopic device): 69.8 mGy Kerma CONTRAST:  50 cc Omnipaque 300. COMPARISON:  CT abdomen pelvis 03/21/2023. FLUOROSCOPY: Fluoroscopy Time: Radiation Exposure Index (if provided by the fluoroscopic device): 69.8 Number of Acquired Spot Images: 8 FINDINGS: Scout view of the abdomen shows  postoperative changes in the left upper quadrant related to gastric bypass. Surgical clips in the right upper quadrant. Mild gaseous distension of colon. Residual excreted contrast in the intrarenal collecting systems and bladder from CT earlier today. Patient was imaged in the 45 degree recumbent AP, LPO and RPO positions. Initially, there is filling of the gastric pouch with reflux of contrast into the esophagus. Subsequent imaging shows leakage of contrast from the distal gastric pouch, in the region of the anastomosis. IMPRESSION: Gastric bypass with a gastrojejunostomy anastomotic leak. Electronically Signed   By: Leanna Battles M.D.   On: 03/21/2023 15:24   CT ABDOMEN PELVIS W CONTRAST  Addendum Date: 03/21/2023   ADDENDUM REPORT: 03/21/2023 12:52 ADDENDUM: Small pockets of gas situated between the bowel and liver. Evidence for an ulceration near the GJ anastomosis on image 52/8 and image 50/8. Findings are suggestive for an anastomotic ulceration and likely the source for the free air. These results were communicated on 03/21/2023 at 12:49 pm to provider Hosie Spangle, who acknowledged these results. Electronically Signed   By: Richarda Overlie M.D.   On: 03/21/2023 12:52   Result Date: 03/21/2023 CLINICAL DATA:  72 year old female with abdominal pain. Pain at gastrostomy tube placed about 5 weeks ago. EXAM: CT ABDOMEN AND PELVIS WITH  CONTRAST TECHNIQUE: Multidetector CT imaging of the abdomen and pelvis was performed using the standard protocol following bolus administration of intravenous contrast. RADIATION DOSE REDUCTION: This exam was performed according to the departmental dose-optimization program which includes automated exposure control, adjustment of the mA and/or kV according to patient size and/or use of iterative reconstruction technique. CONTRAST:  OMNIPAQUE IOHEXOL 300 MG/ML  SOLN COMPARISON:  CT Abdomen and Pelvis 01/08/2023. FINDINGS: Lower chest: Heart size remains normal. No  pericardial or pleural effusion. Increased patchy lung base opacity most resembling atelectasis. Hepatobiliary: Pneumoperitoneum. Small volume of perihepatic free fluid with simple fluid density. Hepatic steatosis. Absent gallbladder. Pancreas: Mild inflammation in the lesser sac appears related to the stomach rather than the pancreas. Negative pancreas otherwise. Spleen: Trace perisplenic free fluid. Adrenals/Urinary Tract: Normal adrenal glands. Nonobstructed kidneys. Chronic renal cysts (no follow-up imaging recommended). Absent renal contrast excretion on the delayed images. But the ureters are decompressed. Bladder is decompressed. Chronic pelvic phleboliths. Stomach/Bowel: Chronic Roux-en-Y type gastrojejunostomy with a superimposed percutaneous gastrostomy tube into the distal portion of the stomach. Abnormal pneumoperitoneum, moderate volume in the upper abdomen and inflammation surrounding the stomach and gastrojejunostomy (series 4, images 22 and 24). Furthermore, the balloon of the enteric tube is at the level of the abdominal wall, peritoneal lining on series 4, image 33. Mild regional inflammation. No fluid collection. The distal stomach and proximal duodenum also appear inflamed. Distal duodenum and ligament of Treitz have a more normal appearance. Downstream small bowel anastomosis on series 4, image 36 appears negative. Superimposed large bowel is redundant but mostly decompressed. Mild secondary inflammation of the splenic flexure with regional pneumoperitoneum there. Negative right colon. Cecum on a lax mesentery series 4, image 51. Evidence of prior appendectomy. Vascular/Lymphatic: Calcified aortic atherosclerosis. Major arterial structures in the abdomen and pelvis are patent. Normal caliber abdominal aorta. Portal venous system appears to be patent. No lymphadenopathy identified. Reproductive: Chronically absent uterus. Ovaries are within normal limits. Other: Small volume of simple density  free fluid at the pelvic inlet on the right series 4, image 65. And small volume of what appears to be fluid tracking into and otherwise fat containing right inguinal hernia (series 4, image 78), new since September. Musculoskeletal: Advanced lumbar facet arthropathy. Stable visualized osseous structures. IMPRESSION: 1. Malpositioned percutaneous Gastrostomy Tube, with balloon pulled out through the ventral gastric wall and associated pneumoperitoneum and free fluid in the abdomen. The stomach, a chronic gastrojejunostomy, and the proximal duodenum all appear inflamed. But no drainable fluid collection, abscess. 2. Underlying chronic Roux-en-Y type gastric bypass. Small bowel distal to the gastrojejunostomy and the ligament of Treitz appears to remain normal. Mild secondary inflammation of the splenic flexure. 3. Small volume of free fluid in the pelvis, within a right inguinal hernia appears related to #1. 4. Hepatic steatosis. Lung base atelectasis. Aortic Atherosclerosis (ICD10-I70.0). Electronically Signed: By: Odessa Fleming M.D. On: 03/21/2023 09:48      [name] 03/22/2023  Patient ID: Christine [name], female   DOB: Oct 30, 1950, 72 y.o.   MRN: [id number]

## 2023-03-22 NOTE — Progress Notes (Signed)
Hypoglycemic Event  CBG: 62  Treatment: D50 25 mL (12.5 gm)  Symptoms: None  Follow-up CBG: [id number] CBG Result:92  Possible Reasons for Event: Inadequate meal intake    Kavin Leech A

## 2023-03-22 NOTE — Plan of Care (Signed)
  Problem: Health Behavior/Discharge Planning: Goal: Ability to manage health-related needs will improve Outcome: Progressing   Problem: Clinical Measurements: Goal: Diagnostic test results will improve Outcome: Progressing Goal: Respiratory complications will improve Outcome: Progressing Goal: Cardiovascular complication will be avoided Outcome: Progressing   Problem: Activity: Goal: Risk for activity intolerance will decrease Outcome: Progressing   Problem: Coping: Goal: Level of anxiety will decrease Outcome: Progressing   Problem: Elimination: Goal: Will not experience complications related to bowel motility Outcome: Progressing   Problem: Pain Management: Goal: General experience of comfort will improve Outcome: Progressing   Problem: Safety: Goal: Ability to remain free from injury will improve Outcome: Progressing

## 2023-03-23 ENCOUNTER — Inpatient Hospital Stay (HOSPITAL_COMMUNITY): Payer: Medicare HMO

## 2023-03-23 DIAGNOSIS — E785 Hyperlipidemia, unspecified: Secondary | ICD-10-CM

## 2023-03-23 DIAGNOSIS — F411 Generalized anxiety disorder: Secondary | ICD-10-CM

## 2023-03-23 DIAGNOSIS — R9431 Abnormal electrocardiogram [ECG] [EKG]: Secondary | ICD-10-CM

## 2023-03-23 DIAGNOSIS — F419 Anxiety disorder, unspecified: Secondary | ICD-10-CM | POA: Diagnosis not present

## 2023-03-23 DIAGNOSIS — N179 Acute kidney failure, unspecified: Secondary | ICD-10-CM | POA: Diagnosis not present

## 2023-03-23 DIAGNOSIS — E872 Acidosis, unspecified: Secondary | ICD-10-CM

## 2023-03-23 DIAGNOSIS — E861 Hypovolemia: Secondary | ICD-10-CM

## 2023-03-23 DIAGNOSIS — K631 Perforation of intestine (nontraumatic): Secondary | ICD-10-CM | POA: Diagnosis not present

## 2023-03-23 DIAGNOSIS — D649 Anemia, unspecified: Secondary | ICD-10-CM | POA: Diagnosis not present

## 2023-03-23 LAB — COMPREHENSIVE METABOLIC PANEL
ALT: 29 U/L (ref 0–44)
AST: 48 U/L — ABNORMAL HIGH (ref 15–41)
Albumin: 2.4 g/dL — ABNORMAL LOW (ref 3.5–5.0)
Alkaline Phosphatase: 147 U/L — ABNORMAL HIGH (ref 38–126)
Anion gap: 12 (ref 5–15)
BUN: 36 mg/dL — ABNORMAL HIGH (ref 8–23)
CO2: 22 mmol/L (ref 22–32)
Calcium: 7.8 mg/dL — ABNORMAL LOW (ref 8.9–10.3)
Chloride: 113 mmol/L — ABNORMAL HIGH (ref 98–111)
Creatinine, Ser: 2.11 mg/dL — ABNORMAL HIGH (ref 0.44–1.00)
GFR, Estimated: 25 mL/min — ABNORMAL LOW (ref >60)
Glucose, Bld: 78 mg/dL (ref 70–99)
Potassium: 3.3 mmol/L — ABNORMAL LOW (ref 3.5–5.1)
Sodium: 147 mmol/L — ABNORMAL HIGH (ref 135–145)
Total Bilirubin: 0.9 mg/dL (ref <1.2)
Total Protein: 5.6 g/dL — ABNORMAL LOW (ref 6.5–8.1)

## 2023-03-23 LAB — CBC WITH DIFFERENTIAL/PLATELET
Abs Immature Granulocytes: 0.09 10*3/uL — ABNORMAL HIGH (ref 0.00–0.07)
Basophils Absolute: 0 10*3/uL (ref 0.0–0.1)
Basophils Relative: 0 %
Eosinophils Absolute: 0.3 10*3/uL (ref 0.0–0.5)
Eosinophils Relative: 2 %
HCT: 34.4 % — ABNORMAL LOW (ref 36.0–46.0)
Hemoglobin: 10.3 g/dL — ABNORMAL LOW (ref 12.0–15.0)
Immature Granulocytes: 1 %
Lymphocytes Relative: 11 %
Lymphs Abs: 1.7 10*3/uL (ref 0.7–4.0)
MCH: 29.5 pg (ref 26.0–34.0)
MCHC: 29.9 g/dL — ABNORMAL LOW (ref 30.0–36.0)
MCV: 98.6 fL (ref 80.0–100.0)
Monocytes Absolute: 0.6 10*3/uL (ref 0.1–1.0)
Monocytes Relative: 4 %
Neutro Abs: 13.4 10*3/uL — ABNORMAL HIGH (ref 1.7–7.7)
Neutrophils Relative %: 82 %
Platelets: 249 10*3/uL (ref 150–400)
RBC: 3.49 MIL/uL — ABNORMAL LOW (ref 3.87–5.11)
RDW: 18.1 % — ABNORMAL HIGH (ref 11.5–15.5)
WBC: 16.1 10*3/uL — ABNORMAL HIGH (ref 4.0–10.5)
nRBC: 0 % (ref 0.0–0.2)

## 2023-03-23 LAB — BASIC METABOLIC PANEL
Anion gap: 10 (ref 5–15)
BUN: 35 mg/dL — ABNORMAL HIGH (ref 8–23)
CO2: 21 mmol/L — ABNORMAL LOW (ref 22–32)
Calcium: 7.4 mg/dL — ABNORMAL LOW (ref 8.9–10.3)
Chloride: 112 mmol/L — ABNORMAL HIGH (ref 98–111)
Creatinine, Ser: 1.85 mg/dL — ABNORMAL HIGH (ref 0.44–1.00)
GFR, Estimated: 29 mL/min — ABNORMAL LOW (ref >60)
Glucose, Bld: 121 mg/dL — ABNORMAL HIGH (ref 70–99)
Potassium: 4.1 mmol/L (ref 3.5–5.1)
Sodium: 143 mmol/L (ref 135–145)

## 2023-03-23 LAB — CBC
HCT: 34.4 % — ABNORMAL LOW (ref 36.0–46.0)
Hemoglobin: 11 g/dL — ABNORMAL LOW (ref 12.0–15.0)
MCH: 30.2 pg (ref 26.0–34.0)
MCHC: 32 g/dL (ref 30.0–36.0)
MCV: 94.5 fL (ref 80.0–100.0)
Platelets: 192 10*3/uL (ref 150–400)
RBC: 3.64 MIL/uL — ABNORMAL LOW (ref 3.87–5.11)
RDW: 17.9 % — ABNORMAL HIGH (ref 11.5–15.5)
WBC: 19.4 10*3/uL — ABNORMAL HIGH (ref 4.0–10.5)
nRBC: 0 % (ref 0.0–0.2)

## 2023-03-23 LAB — ECHOCARDIOGRAM COMPLETE
AR max vel: 1.93 cm2
AV Area VTI: 1.9 cm2
AV Area mean vel: 1.86 cm2
AV Mean grad: 7 mm[Hg]
AV Peak grad: 13.7 mm[Hg]
Ao pk vel: 1.85 m/s
Area-P 1/2: 7.16 cm2
Calc EF: 52 %
S' Lateral: 2.6 cm
Single Plane A2C EF: 51.8 %
Single Plane A4C EF: 51.9 %

## 2023-03-23 LAB — PHOSPHORUS: Phosphorus: 3 mg/dL (ref 2.5–4.6)

## 2023-03-23 LAB — GLUCOSE, CAPILLARY
Glucose-Capillary: 73 mg/dL (ref 70–99)
Glucose-Capillary: 60 mg/dL — ABNORMAL LOW (ref 70–99)
Glucose-Capillary: 86 mg/dL (ref 70–99)
Glucose-Capillary: 54 mg/dL — ABNORMAL LOW (ref 70–99)
Glucose-Capillary: 76 mg/dL (ref 70–99)
Glucose-Capillary: 64 mg/dL — ABNORMAL LOW (ref 70–99)
Glucose-Capillary: 94 mg/dL (ref 70–99)
Glucose-Capillary: 45 mg/dL — ABNORMAL LOW (ref 70–99)
Glucose-Capillary: 118 mg/dL — ABNORMAL HIGH (ref 70–99)
Glucose-Capillary: 101 mg/dL — ABNORMAL HIGH (ref 70–99)

## 2023-03-23 LAB — MAGNESIUM: Magnesium: 1.3 mg/dL — ABNORMAL LOW (ref 1.7–2.4)

## 2023-03-23 MED ORDER — DEXTROSE 50 % IV SOLN
12.5000 g | INTRAVENOUS | Status: AC
  Administered 2023-03-23: 12.5 g via INTRAVENOUS

## 2023-03-23 MED ORDER — DEXTROSE 50 % IV SOLN
25.0000 g | INTRAVENOUS | Status: AC
  Administered 2023-03-23: 25 g via INTRAVENOUS

## 2023-03-23 MED ORDER — HYDROMORPHONE HCL 1 MG/ML IJ SOLN
0.5000 mg | INTRAMUSCULAR | Status: DC | PRN
  Administered 2023-03-24 – 2023-03-25 (×4): 0.5 mg via INTRAVENOUS
  Filled 2023-03-23 (×4): qty 1

## 2023-03-23 MED ORDER — DEXTROSE 10 % IV SOLN: INTRAVENOUS | Status: DC

## 2023-03-23 MED ORDER — METHOCARBAMOL 1000 MG/10ML IJ SOLN: 500.0000 mg | Freq: Three times a day (TID) | INTRAMUSCULAR | Status: DC | PRN

## 2023-03-23 MED ORDER — DEXTROSE 50 % IV SOLN
INTRAVENOUS | Status: AC
  Filled 2023-03-23: qty 50

## 2023-03-23 MED ORDER — POTASSIUM CHLORIDE 10 MEQ/100ML IV SOLN
10.0000 meq | INTRAVENOUS | Status: AC
  Administered 2023-03-23 (×4): 10 meq via INTRAVENOUS
  Filled 2023-03-23 (×4): qty 100

## 2023-03-23 MED ORDER — ENOXAPARIN SODIUM 40 MG/0.4ML IJ SOSY
40.0000 mg | PREFILLED_SYRINGE | INTRAMUSCULAR | Status: DC
  Administered 2023-03-24 – 2023-04-05 (×13): 40 mg via SUBCUTANEOUS
  Filled 2023-03-23 (×13): qty 0.4

## 2023-03-23 MED ORDER — DEXTROSE 50 % IV SOLN
12.5000 g | INTRAVENOUS | Status: AC
  Filled 2023-03-23: qty 50

## 2023-03-23 MED ORDER — PERFLUTREN LIPID MICROSPHERE
1.0000 mL | INTRAVENOUS | Status: AC | PRN
  Administered 2023-03-23: 3 mL via INTRAVENOUS

## 2023-03-23 MED ORDER — MORPHINE SULFATE (PF) 2 MG/ML IV SOLN: 2.0000 mg | INTRAVENOUS | Status: DC | PRN

## 2023-03-23 MED ORDER — ENOXAPARIN SODIUM 30 MG/0.3ML IJ SOSY: 30.0000 mg | PREFILLED_SYRINGE | INTRAMUSCULAR | Status: DC

## 2023-03-23 NOTE — Progress Notes (Addendum)
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  Patient found to have intra-abdominal free air and fluid and concern for possible GJ anastomosis ulceration resulting in perforation.  Patient also noted to have significant abdominal pain and vomiting for 3 days prior to admission.  Patient was seen by general surgery and underwent  exploratory laparotomy, abdominal washout, Graham patch repair of perforated gastrojejunal ulcer, intraperitoneal drain placement.  Patient subsequently admitted to the stepdown unit for closer monitoring since hypotensive and also in acute renal failure.   Assessment & Plan:   Principal Problem:   Bowel perforation (HCC) Active Problems:   Normocytic anemia   GAD (generalized anxiety disorder)   Anxiety   Hyperlipidemia   Hypernatremia   ARF (acute renal failure) (HCC)   Hypotension   Metabolic acidosis   Perforated marginal ulcer Likely secondary to G-tube displacement versus GJ anastomosis ulceration.  Patient underwent  expiratory laparotomy, abdominal washout, Graham patch repair of perforated gastrojejunal ulcer, intraperitoneal drain placement on 03/21/2023.  Continue n.p.o., IV fluids, IV Zosyn PPI. Gastrostomy tube noted to be repositioned preoperatively per IR.  Further plans as per general surgery.  Metabolic encephalopathy.  Likely multifactorial from  surgery, IV narcotics and renal failure hypoglycemia.  Patient appears to less interactive answering same only.  Minimize narcotics if possible.  Hypoglycemia.  Reported by nursing staff.  On D5 normal saline.  Will change to D10 for now due to reportedly low blood  glucose level.  Patient is currently NPO.  Has decreased mentation.   Hypotension Due to bowel perforation responded with IV fluids.  Afebrile and no leukocytosis.  Blood cultures negative so far.  Blood pressure has improved at this time.  Acute renal failure with metabolic acidosis  creatinine up to 2.04 on 03/22/2023 from 1.08 on admission.  Thought to be secondary to hypotension. Urine sodium < 10, urine creatinine 129.  Patient received 2 L of IV fluid bolus and was put on bicarb drip.  Continue intake and output charting Daily weights.  Continue today at 2.1.  Patient is overall positive balance for 9556 mL and had 4117 mL output yesterday. Will consider nephrology evaluation if renal function worsens.  Acidosis has improved.  Will discontinue bicarb drip.  Hypokalemia.  On bicarb drip.  Will replenish potassium aggressively.  DC bicarb drip today.  Probable UTI On IV Zosyn.  Urine culture pending.   New ST depression On admission.  No previous EKG. Admitting physician discussed with cardiology and recommended monitoring.  Patient did have tachycardia.  On-call feel may be a new left bundle branch block.  Check 2D echocardiogram. May need outpatient follow-up with cardiology.  GERD Continue PPI.  Hypernatremia Mild, likely secondary to dehydration.Urine sodium < 10.  Continue D5 water for now. Check BMP in AM.   Anemia Ferritin of 229, serum iron low at 11 TIBC low, folic acid normal at 19, vitamin B12 elevated at 1005. Continue to monitor CBC.  Transfuse for hemoglobin less than 8.   DVT prophylaxis: Lovenox subcu.  Code Status: DNR  Family Communication:   Spoke with the patient's sister [name] on the phone and updated her about the clinical condition of the patient.  Disposition: Uncertain at this time.  Status is: Inpatient Remains inpatient appropriate because: Severity of illness, pending clinical improvement,   Consultants:  IR General Surgery: Dr.Gerkin  03/21/2023  Procedures:   Gastrostomy tube injection manipulation with fluoroscopy per IR: Dr. Lowella Dandy 03/21/2023  Exploratory laparotomy, abdominal washout, [name] patch repair of perforated gastrojejunal ulcer, intraperitoneal drain placement per general surgery: Dr. Freida Busman 03/21/2023  Antimicrobials:  Zosyn IV  Subjective  Today, patient was seen and examined at bedside.  Nursing staff reports decreased mentation with less responsiveness.    Had received narcotics as well.  Repeats same answer saying yeah.  Nursing staff reported hypoglycemia.  Objective: Vitals:   03/22/23 2312 03/23/23 0000 03/23/23 0300 03/23/23 0400  BP:  (!) 147/72 (!) 156/61 (!) 142/81  Pulse: (!) 109 (!) 115 (!) 113 (!) 115  Resp: (!) 23 20 (!) 23 20  Temp: 98.2 F (36.8 C)  98.4 F (36.9 C)   TempSrc: Oral  Axillary   SpO2: 94% 92% 92% (!) 89%    Intake/Output Summary (Last 24 hours) at 03/23/2023 0741 Last data filed at 03/23/2023 0618 Gross per 24 hour  Intake 4827.7 ml  Output 710 ml  Net 4117.7 ml   There were no vitals filed for this visit.  Physical examination:  There is no height or weight on file to calculate BMI.  General:  Average built, not in obvious distress, on room air HENT:   No scleral pallor or icterus noted. Oral mucosa is moist.  Chest:    Diminished breath sounds bilaterally. No crackles or wheezes.  CVS: S1 &S2 heard. No murmur.  Regular rate and rhythm.  Mildly tachycardic Abdomen: Diffuse tenderness on palpation, dressing in place with right lower quadrant JP drain.  G-tube in place.  Hypoactive bowel sounds.  Urethral catheter in place. Extremities: No cyanosis, clubbing or edema.   Psych: Sleepy, minimally interactive, CNS:  minimally interactive.   Moves all the extremities.  Skin: Warm and dry.  Surgical dressing in place in the abdomen.   Data Reviewed: I have personally reviewed following labs and imaging studies  CBC: Recent Labs  Lab 03/21/23 0715  03/22/23 0316 03/22/23 1037 03/23/23 0335  WBC 6.9 14.3* 13.6* 16.1*  NEUTROABS 5.5  --   --  13.4*  HGB 11.1* 9.7* 9.4* 10.3*  HCT 36.0 33.5* 31.7* 34.4*  MCV 96.0 101.2* 99.1 98.6  PLT 312 265 290 249    Basic Metabolic Panel: Recent Labs  Lab 03/21/23 0715 03/22/23 0316 03/23/23 0335  NA 146* 145 147*  K 3.9 4.1 3.3*  CL 116* 118* 113*  CO2 20* 17* 22  GLUCOSE 90 79 78  BUN 29* 32* 36*  CREATININE 1.08* 2.04* 2.11*  CALCIUM 8.9 7.8* 7.8*  MG  --   --  1.3*  PHOS  --   --  3.0    GFR: CrCl cannot be calculated (Unknown ideal weight.).  Liver Function Tests: Recent Labs  Lab 03/21/23 0715 03/23/23 0335  AST 33 48*  ALT 20 29  ALKPHOS 108 147*  BILITOT 0.8 0.9  PROT 6.9 5.6*  ALBUMIN 2.7* 2.4*    CBG: Recent Labs  Lab 03/22/23 2009 03/22/23 2310 03/22/23 2334 03/23/23 0327 03/23/23 0718  GLUCAP 94 51* 89 73 60*     Recent Results (from the past 240 hour(s))  MRSA Next Gen by PCR, Nasal     Status: None   Collection Time: 03/21/23  8:21 PM   Specimen: Nasal Mucosa; Nasal Swab  Result Value Ref Range Status   MRSA by PCR Next Gen NOT DETECTED NOT DETECTED Final    Comment: (NOTE) The GeneXpert MRSA Assay (FDA approved for NASAL specimens only), is one component of a comprehensive MRSA colonization surveillance program. It is not intended to diagnose MRSA infection nor to guide or monitor treatment for MRSA infections. Test performance is not FDA approved in patients less than 19 years old. Performed at [facility], [address], [place], [place] [address]   Culture, blood (Routine X 2) w Reflex to ID Panel     Status: None (Preliminary result)   Collection Time: 03/22/23  9:16 PM   Specimen: BLOOD LEFT ARM  Result Value Ref Range Status   Specimen Description   Final    BLOOD LEFT ARM Performed at [facility], [address] [place], [place] [address]    Special Requests   Final    BOTTLES DRAWN  AEROBIC ONLY Blood Culture results may not be optimal due to an inadequate volume of blood received in culture bottles Performed at [facility], [address], [place], [place] [address]    Culture   Final    NO GROWTH < 12 HOURS Performed at [facility], [address] [place], [place] [address]    Report Status PENDING  Incomplete  Culture, blood (Routine X 2) w Reflex to ID Panel     Status: None (Preliminary result)   Collection Time: 03/22/23  9:16 PM   Specimen: BLOOD RIGHT HAND  Result Value Ref Range Status   Specimen Description   Final    BLOOD RIGHT HAND Performed at [facility], [address], [place], [place] [address]    Special Requests   Final    BOTTLES DRAWN AEROBIC ONLY Blood Culture results may not be optimal due to an inadequate volume of blood received in culture bottles Performed at [facility], [address], [place], [place] [address]    Culture   Final    NO GROWTH < 12 HOURS Performed at [facility], [address], [place], [place] [address]    Report Status PENDING  Incomplete       Radiology Studies: DG Fluoro Rm 1-60 Min  Result Date: 03/22/2023 INDICATION: Evaluate gastrostomy tube. Gastrostomy tube appeared to be malpositioned on CT. EXAM: FLUOROSCOPY TIME FOR G-TUBE INJECTION MEDICATIONS: None ANESTHESIA/SEDATION: None CONTRAST:  10 mL-administered into the gastric lumen. FLUOROSCOPY: Fluoroscopy time was included in the upper GI examination. COMPLICATIONS: None immediate. PROCEDURE: This procedure was performed immediately following the upper GI procedure while the patient was still in the fluoroscopy room. The gastrostomy tube was injected with contrast. Gastrostomy tube was flushed with water after the injection. FINDINGS: Gastrostomy tube injection demonstrates filling of the stomach. There was no evidence for contrast extravasation or leak. Gastrostomy tube flushed  easily. IMPRESSION: Gastrostomy tube communicates with the stomach. No evidence for a leak. Electronically Signed   By: Richarda Overlie M.D.   On: 03/22/2023 11:30   IR GASTROSTOMY TUBE REMOVAL/REPAIR  Result Date: 03/21/2023 INDICATION: 72 year old with pneumoperitoneum and leak near the gastrojejunostomy anastomosis. In addition, the patient has pain at the gastrostomy tube site and CT demonstrates that the gastrostomy tube balloon is partially within the ventral abdominal wall. EXAM: 1. Gastrostomy tube injection and manipulation with fluoroscopy MEDICATIONS: Viscous lidocaine ANESTHESIA/SEDATION: None CONTRAST:  25 mL Omnipaque 300-administered into the gastric lumen. FLUOROSCOPY: Radiation Exposure Index (  as provided by the fluoroscopic device): 56 mGy Kerma COMPLICATIONS: None immediate. PROCEDURE: Informed consent was obtained for gastrostomy tube injection and exchange. The gastrostomy tube was initially injected with contrast immediately following the upper GI while the patient was still on the fluoroscopy table. The gastrostomy tube injection confirmed filling of the stomach without a leak. However, the gastrostomy tube would not move freely within the stomach suggesting that the balloon was stuck within the ventral abdominal wall. Therefore, the patient was transferred to Interventional Radiology for exchange or manipulation of the gastrostomy tube. Patient was brought into the interventional radiology suite. The anterior abdomen was prepped and draped in sterile fashion. Maximal barrier sterile technique was utilized including caps, mask, sterile gowns, sterile gloves, sterile drape, hand hygiene and skin antiseptic. Viscous lidocaine was placed along the gastrostomy tube tract. Following the administration of the viscous lidocaine, the gastrostomy tube balloon advanced into the stomach lumen and contrast injection confirmed placement within the gastric lumen. The balloon was deflated. The balloon was  re-inflated with 4 mL of saline. Additional contrast was injected to confirm placement in the stomach. No evidence for a leak. FINDINGS: The gastrostomy tube balloon appeared to be stuck within the ventral abdominal wall but the gastrostomy tube was still communicating with the gastric lumen and there was no evidence for a leak. The balloon was advanced back into the stomach lumen after viscous lidocaine was injected around the tube. At the end of the procedure, the gastrostomy tube was well positioned within the stomach. Currently, there is 4 mL of saline within the gastrostomy tube retention balloon. IMPRESSION: Successful repositioning of the gastrostomy tube balloon. Contrast injection confirmed placement in the stomach. No evidence for a leak at the gastrostomy tube site. Electronically Signed   By: Richarda Overlie M.D.   On: 03/21/2023 16:18   DG UGI W SINGLE CM (SOL OR THIN BA)  Result Date: 03/21/2023 CLINICAL DATA:  Gastric bypass with suspected leak on CT abdomen. EXAM: WATER SOLUBLE UPPER GI SERIES TECHNIQUE: Single-column upper GI series was performed using water soluble contrast. Radiation Exposure Index (as provided by the fluoroscopic device): 69.8 mGy Kerma CONTRAST:  50 cc Omnipaque 300. COMPARISON:  CT abdomen pelvis 03/21/2023. FLUOROSCOPY: Fluoroscopy Time: Radiation Exposure Index (if provided by the fluoroscopic device): 69.8 Number of Acquired Spot Images: 8 FINDINGS: Scout view of the abdomen shows postoperative changes in the left upper quadrant related to gastric bypass. Surgical clips in the right upper quadrant. Mild gaseous distension of colon. Residual excreted contrast in the intrarenal collecting systems and bladder from CT earlier today. Patient was imaged in the 45 degree recumbent AP, LPO and RPO positions. Initially, there is filling of the gastric pouch with reflux of contrast into the esophagus. Subsequent imaging shows leakage of contrast from the distal gastric pouch, in the  region of the anastomosis. IMPRESSION: Gastric bypass with a gastrojejunostomy anastomotic leak. Electronically Signed   By: Leanna Battles M.D.   On: 03/21/2023 15:24   CT ABDOMEN PELVIS W CONTRAST  Addendum Date: 03/21/2023   ADDENDUM REPORT: 03/21/2023 12:52 ADDENDUM: Small pockets of gas situated between the bowel and liver. Evidence for an ulceration near the GJ anastomosis on image 52/8 and image 50/8. Findings are suggestive for an anastomotic ulceration and likely the source for the free air. These results were communicated on 03/21/2023 at 12:49 pm to provider Hosie Spangle, who acknowledged these results. Electronically Signed   By: Richarda Overlie M.D.   On: 03/21/2023 12:52  Result Date: 03/21/2023 CLINICAL DATA:  72 year old female with abdominal pain. Pain at gastrostomy tube placed about 5 weeks ago. EXAM: CT ABDOMEN AND PELVIS WITH CONTRAST TECHNIQUE: Multidetector CT imaging of the abdomen and pelvis was performed using the standard protocol following bolus administration of intravenous contrast. RADIATION DOSE REDUCTION: This exam was performed according to the departmental dose-optimization program which includes automated exposure control, adjustment of the mA and/or kV according to patient size and/or use of iterative reconstruction technique. CONTRAST:  OMNIPAQUE IOHEXOL 300 MG/ML  SOLN COMPARISON:  CT Abdomen and Pelvis 01/08/2023. FINDINGS: Lower chest: Heart size remains normal. No pericardial or pleural effusion. Increased patchy lung base opacity most resembling atelectasis. Hepatobiliary: Pneumoperitoneum. Small volume of perihepatic free fluid with simple fluid density. Hepatic steatosis. Absent gallbladder. Pancreas: Mild inflammation in the lesser sac appears related to the stomach rather than the pancreas. Negative pancreas otherwise. Spleen: Trace perisplenic free fluid. Adrenals/Urinary Tract: Normal adrenal glands. Nonobstructed kidneys. Chronic renal cysts (no  follow-up imaging recommended). Absent renal contrast excretion on the delayed images. But the ureters are decompressed. Bladder is decompressed. Chronic pelvic phleboliths. Stomach/Bowel: Chronic Roux-en-Y type gastrojejunostomy with a superimposed percutaneous gastrostomy tube into the distal portion of the stomach. Abnormal pneumoperitoneum, moderate volume in the upper abdomen and inflammation surrounding the stomach and gastrojejunostomy (series 4, images 22 and 24). Furthermore, the balloon of the enteric tube is at the level of the abdominal wall, peritoneal lining on series 4, image 33. Mild regional inflammation. No fluid collection. The distal stomach and proximal duodenum also appear inflamed. Distal duodenum and ligament of Treitz have a more normal appearance. Downstream small bowel anastomosis on series 4, image 36 appears negative. Superimposed large bowel is redundant but mostly decompressed. Mild secondary inflammation of the splenic flexure with regional pneumoperitoneum there. Negative right colon. Cecum on a lax mesentery series 4, image 51. Evidence of prior appendectomy. Vascular/Lymphatic: Calcified aortic atherosclerosis. Major arterial structures in the abdomen and pelvis are patent. Normal caliber abdominal aorta. Portal venous system appears to be patent. No lymphadenopathy identified. Reproductive: Chronically absent uterus. Ovaries are within normal limits. Other: Small volume of simple density free fluid at the pelvic inlet on the right series 4, image 65. And small volume of what appears to be fluid tracking into and otherwise fat containing right inguinal hernia (series 4, image 78), new since September. Musculoskeletal: Advanced lumbar facet arthropathy. Stable visualized osseous structures. IMPRESSION: 1. Malpositioned percutaneous Gastrostomy Tube, with balloon pulled out through the ventral gastric wall and associated pneumoperitoneum and free fluid in the abdomen. The stomach, a  chronic gastrojejunostomy, and the proximal duodenum all appear inflamed. But no drainable fluid collection, abscess. 2. Underlying chronic Roux-en-Y type gastric bypass. Small bowel distal to the gastrojejunostomy and the ligament of Treitz appears to remain normal. Mild secondary inflammation of the splenic flexure. 3. Small volume of free fluid in the pelvis, within a right inguinal hernia appears related to #1. 4. Hepatic steatosis. Lung base atelectasis. Aortic Atherosclerosis (ICD10-I70.0). Electronically Signed: By: Odessa Fleming M.D. On: 03/21/2023 09:48      Scheduled Meds:  Chlorhexidine Gluconate Cloth  6 each Topical Daily   enoxaparin (LOVENOX) injection  40 mg Subcutaneous Q24H   methocarbamol (ROBAXIN) injection  500 mg Intravenous Q8H   mouth rinse  15 mL Mouth Rinse 4 times per day   pantoprazole (PROTONIX) IV  40 mg Intravenous Q8H   Followed by   [START ON 03/25/2023] pantoprazole (PROTONIX) IV  40 mg Intravenous Q12H  Continuous Infusions:  dextrose 5 % and 0.9 % NaCl 20 mL/hr at 03/23/23 0600   piperacillin-tazobactam (ZOSYN)  IV 3.375 g (03/23/23 4098)   sodium bicarbonate 150 mEq in dextrose 5 % 1,150 mL infusion 125 mL/hr at 03/23/23 0600     LOS: 2 days     Joycelyn Das, MD [facility] 03/23/2023, 7:41 AM

## 2023-03-23 NOTE — Progress Notes (Signed)
  Echocardiogram 2D Echocardiogram has been performed.  Reinaldo Raddle Hollis Oh 03/23/2023, 9:24 AM

## 2023-03-23 NOTE — Plan of Care (Signed)
Problem: Nutrition: Goal: Adequate nutrition will be maintained Outcome: Progressing   Problem: Elimination: Goal: Will not experience complications related to urinary retention Outcome: Progressing   Problem: Pain Management: Goal: General experience of comfort will improve Outcome: Progressing

## 2023-03-23 NOTE — TOC Initial Note (Signed)
Transition of Care [facility]) - Initial/Assessment Note    Patient Details  Name: Christine [name] MRN: [id number] Date of Birth: 12-Sep-1950  Transition of Care [facility]) CM/SW Contact:    Beckie Busing, RN Phone Number:[phone]  03/23/2023, 11:11 AM  Clinical Narrative:                 TOC following patient with high risk for readmission. Patient at bedside to assess patient and patient is sleeping soundly and does not arouse for conversation. CM called sister Emogean Boyatt who is listed as next of kin. Per [name] patient is from [facility] where patient was receiving short term rehab. Sister states that patient has been in rehab/hospital for the past 5 months. Sister states that patient previously was from home where she lived alone and managed independently. Sister states that patient  was planning to sell her home and move to the [place] area. Sister verbalizes that she is not sure patient will have anymore medicare days for rehab. TOC will continue to follow for disposition needs.   Expected Discharge Plan: Skilled Nursing Facility Barriers to Discharge: Continued Medical Work up   Patient Goals and CMS Choice Patient states their goals for this hospitalization and ongoing recovery are:: patient asleep assessment done with sister via phone. CMS Medicare.gov Compare Post Acute Care list provided to::  (will be provided for sister after therapy assessment) Choice offered to / list presented to : Sibling [place] ownership interest in [facility].provided to:: Sibling    Expected Discharge Plan and Services In-house Referral: NA Discharge Planning Services: CM Consult Post Acute Care Choice: Skilled Nursing Facility Living arrangements for the past 2 months: Skilled Nursing Facility                 DME Arranged: N/A DME Agency: NA       HH Arranged: NA HH Agency: NA        Prior Living Arrangements/Services Living arrangements for the past 2 months: Skilled  Nursing Facility Lives with:: Facility Resident Patient language and need for interpreter reviewed:: Yes Do you feel safe going back to the place where you live?:  (patient unable to answer)      Need for Family Participation in Patient Care: Yes (Comment) Care giver support system in place?: Yes (comment) Current home services: DME (DME CPAP machine) Criminal Activity/Legal Involvement Pertinent to Current Situation/Hospitalization: No - Comment as needed  Activities of Daily Living   ADL Screening (condition at time of admission) Independently performs ADLs?: No Does the patient have a NEW difficulty with bathing/dressing/toileting/self-feeding that is expected to last >3 days?: No Does the patient have a NEW difficulty with getting in/out of bed, walking, or climbing stairs that is expected to last >3 days?: No Does the patient have a NEW difficulty with communication that is expected to last >3 days?: No Is the patient deaf or have difficulty hearing?: No Does the patient have difficulty seeing, even when wearing glasses/contacts?: No Does the patient have difficulty concentrating, remembering, or making decisions?: Yes  Permission Sought/Granted Permission sought to share information with : Family Supports Permission granted to share information with : No              Emotional Assessment Appearance:: Appears stated age Attitude/Demeanor/Rapport: Unable to Assess (patient asleep) Affect (typically observed): Unable to Assess (patient asleep) Orientation: :  (unable to assess patient sleeping soundly) Alcohol / Substance Use: Not Applicable Psych Involvement: No (comment)  Admission diagnosis:  Pneumoperitoneum [  K66.8] Bowel perforation (HCC) [K63.1] S/P percutaneous endoscopic gastrostomy (PEG) tube placement (HCC) [Z93.1] Problem with gastrostomy tube (HCC) [K94.20] Left upper quadrant abdominal pain [R10.12] Patient Active Problem List   Diagnosis Date Noted    Hypotension 03/22/2023   Metabolic acidosis 03/22/2023   Bowel perforation (HCC) 03/21/2023   Pressure injury of skin 01/29/2023   Esophageal dysphagia 01/15/2023   Abnormal esophagram 01/15/2023   Hypoglycemia 01/15/2023   Hypernatremia 01/08/2023   Severe dehydration 01/08/2023   Altered mental status 01/08/2023   Failure to thrive in adult 01/08/2023   ARF (acute renal failure) (HCC) 01/08/2023   Gallbladder dilatation 01/08/2023   SIRS (systemic inflammatory response syndrome) (HCC) 11/09/2022   Sepsis (HCC) 11/09/2022   Acute metabolic encephalopathy 11/09/2022   Renal insufficiency 11/09/2022   Hypokalemia 11/09/2022   Normocytic anemia 11/09/2022   Morbid obesity with BMI of 50.0-59.9, adult (HCC) 09/14/2022   Gastric bypass status for obesity 09/14/2022   Intermittent exophthalmos of right eye 06/09/2022   Gastric polyp 04/08/2022   Hyperlipidemia 05/20/2021   Lumbar radiculopathy 12/24/2020   Trigger little finger of left hand 12/16/2020   Medial epicondylitis of elbow, left 12/16/2020   Memory difficulties 12/11/2020   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   Globus sensation 11/15/2019   Gastroesophageal reflux disease 11/15/2019   Laryngopharyngeal reflux (LPR) 11/15/2019   Throat clearing 10/05/2019   Gout 08/31/2019   Phlebitis 08/31/2019   Prediabetes 08/31/2019   OA (osteoarthritis) of knee 05/17/2019   Acute pain of right knee 05/17/2019   Abdominal pain 03/31/2019   Morbid obesity (HCC) 03/29/2018   GAD (generalized anxiety disorder) 02/27/2018   Mixed incontinence urge and stress 02/27/2018   OSA on CPAP 06/16/2017   Acute appendicitis 05/06/2016   Gastro-esophageal reflux disease with esophagitis 07/26/2011   Essential hypertension 07/26/2011   Osteoarthrosis 10/16/2009   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 [place] [address] Phone: [phone] Fax: [phone]     Social Determinants of Health (SDOH) Social History: SDOH Screenings   Food Insecurity: No Food Insecurity (03/23/2023)  Housing: Patient Declined (03/23/2023)  Transportation Needs: No Transportation Needs (03/23/2023)  Utilities: Not At Risk (03/23/2023)  Alcohol Screen: Low Risk  (07/13/2022)  Depression (PHQ2-9): Low Risk  (01/21/2022)  Financial Resource Strain: Low Risk  (12/01/2022)   Received from [facility]  Physical Activity: Insufficiently Active (07/13/2022)  Social Connections: Moderately Integrated (12/01/2022)   Received from [facility]  Stress: No Stress Concern Present (07/13/2022)  Tobacco Use: Medium Risk (03/21/2023)  Health Literacy: Medium Risk (12/01/2022)   Received from [facility]   SDOH Interventions:     Readmission Risk Interventions    03/23/2023   10:43 AM 01/09/2023   11:11 AM  Readmission Risk Prevention Plan  Transportation Screening Complete Complete  HRI or Home Care Consult  Complete  Social Work Consult for Recovery Care Planning/Counseling  Complete  Palliative Care Screening  Not Applicable  Medication Review Oceanographer) Complete Complete  PCP or Specialist appointment within 3-5 days of discharge Complete   HRI or Home  Care Consult Complete   SW Recovery Care/Counseling Consult Complete   Palliative Care Screening Not Applicable   Skilled Nursing Facility Complete

## 2023-03-23 NOTE — Progress Notes (Signed)
Assessment & Plan: Perforated marginal ulcer - POD#2 - status post ex lap, abdominal washout, Graham patch repair of perforated gastrojejunal ulcer, drain placement - 03/21/2023 Dr. Freida Busman - NPO, IVF - IV Zosyn - PPI - monitor drain output - OOB, mobilize - appreciate TRH management - tentatively plan UGI series on Friday 11/29 prior to beginning any po diet        [name], MD [facility] practice Office: [phone]        Chief Complaint: Perforated marginal ulcer  Subjective: Patient in bed, responsive.  Mild pain.  Objective: Vital signs in last 24 hours: Temp:  [97.9 F (36.6 C)-99.4 F (37.4 C)] 99.4 F (37.4 C) (11/27 0757) Pulse Rate:  [101-116] 115 (11/27 0400) Resp:  [13-26] 20 (11/27 0400) BP: (95-164)/(40-83) 142/81 (11/27 0400) SpO2:  [89 %-100 %] 89 % (11/27 0400) Last BM Date :  (PTA)  Intake/Output from previous day: 11/26 0701 - 11/27 0700 In: 4827.7 [I.V.:2536.8; IV Piggyback:2290.9] Out: 710 [Urine:570; Drains:140] Intake/Output this shift: No intake/output data recorded.  Physical Exam: HEENT - sclerae clear, mucous membranes moist Neck - soft Abdomen - soft, obese; midline wound intact with wet to dry dressing in place; JP with serosanguinous output - small; gastrostomy plugged LUQ  Lab Results:  Recent Labs    03/22/23 1037 03/23/23 0335  WBC 13.6* 16.1*  HGB 9.4* 10.3*  HCT 31.7* 34.4*  PLT 290 249   BMET Recent Labs    03/22/23 0316 03/23/23 0335  NA 145 147*  K 4.1 3.3*  CL 118* 113*  CO2 17* 22  GLUCOSE 79 78  BUN 32* 36*  CREATININE 2.04* 2.11*  CALCIUM 7.8* 7.8*   PT/INR No results for input(s): "LABPROT", "INR" in the last 72 hours. Comprehensive Metabolic Panel:    Component Value Date/Time   NA 147 (H) 03/23/2023 0335   NA 145 03/22/2023 0316   K 3.3 (L) 03/23/2023 0335   K 4.1 03/22/2023 0316   CL 113 (H) 03/23/2023 0335   CL 118 (H) 03/22/2023 0316   CO2 22 03/23/2023  0335   CO2 17 (L) 03/22/2023 0316   BUN 36 (H) 03/23/2023 0335   BUN 32 (H) 03/22/2023 0316   CREATININE 2.11 (H) 03/23/2023 0335   CREATININE 2.04 (H) 03/22/2023 0316   CREATININE 0.91 07/26/2017 1127   GLUCOSE 78 03/23/2023 0335   GLUCOSE 79 03/22/2023 0316   CALCIUM 7.8 (L) 03/23/2023 0335   CALCIUM 7.8 (L) 03/22/2023 0316   AST 48 (H) 03/23/2023 0335   AST 33 03/21/2023 0715   AST 15 07/26/2017 1127   ALT 29 03/23/2023 0335   ALT 20 03/21/2023 0715   ALT 14 07/26/2017 1127   ALKPHOS 147 (H) 03/23/2023 0335   ALKPHOS 108 03/21/2023 0715   BILITOT 0.9 03/23/2023 0335   BILITOT 0.8 03/21/2023 0715   BILITOT 0.5 07/26/2017 1127   PROT 5.6 (L) 03/23/2023 0335   PROT 6.9 03/21/2023 0715   ALBUMIN 2.4 (L) 03/23/2023 0335   ALBUMIN 2.7 (L) 03/21/2023 0715    Studies/Results: DG Fluoro Rm 1-60 Min  Result Date: 03/22/2023 INDICATION: Evaluate gastrostomy tube. Gastrostomy tube appeared to be malpositioned on CT. EXAM: FLUOROSCOPY TIME FOR G-TUBE INJECTION MEDICATIONS: None ANESTHESIA/SEDATION: None CONTRAST:  10 mL-administered into the gastric lumen. FLUOROSCOPY: Fluoroscopy time was included in the upper GI examination. COMPLICATIONS: None immediate. PROCEDURE: This procedure was performed immediately following the upper GI procedure while the patient was still in the fluoroscopy  room. The gastrostomy tube was injected with contrast. Gastrostomy tube was flushed with water after the injection. FINDINGS: Gastrostomy tube injection demonstrates filling of the stomach. There was no evidence for contrast extravasation or leak. Gastrostomy tube flushed easily. IMPRESSION: Gastrostomy tube communicates with the stomach. No evidence for a leak. Electronically Signed   By: Richarda Overlie M.D.   On: 03/22/2023 11:30   IR GASTROSTOMY TUBE REMOVAL/REPAIR  Result Date: 03/21/2023 INDICATION: 72 year old with pneumoperitoneum and leak near the gastrojejunostomy anastomosis. In addition, the patient  has pain at the gastrostomy tube site and CT demonstrates that the gastrostomy tube balloon is partially within the ventral abdominal wall. EXAM: 1. Gastrostomy tube injection and manipulation with fluoroscopy MEDICATIONS: Viscous lidocaine ANESTHESIA/SEDATION: None CONTRAST:  25 mL Omnipaque 300-administered into the gastric lumen. FLUOROSCOPY: Radiation Exposure Index (as provided by the fluoroscopic device): 56 mGy Kerma COMPLICATIONS: None immediate. PROCEDURE: Informed consent was obtained for gastrostomy tube injection and exchange. The gastrostomy tube was initially injected with contrast immediately following the upper GI while the patient was still on the fluoroscopy table. The gastrostomy tube injection confirmed filling of the stomach without a leak. However, the gastrostomy tube would not move freely within the stomach suggesting that the balloon was stuck within the ventral abdominal wall. Therefore, the patient was transferred to Interventional Radiology for exchange or manipulation of the gastrostomy tube. Patient was brought into the interventional radiology suite. The anterior abdomen was prepped and draped in sterile fashion. Maximal barrier sterile technique was utilized including caps, mask, sterile gowns, sterile gloves, sterile drape, hand hygiene and skin antiseptic. Viscous lidocaine was placed along the gastrostomy tube tract. Following the administration of the viscous lidocaine, the gastrostomy tube balloon advanced into the stomach lumen and contrast injection confirmed placement within the gastric lumen. The balloon was deflated. The balloon was re-inflated with 4 mL of saline. Additional contrast was injected to confirm placement in the stomach. No evidence for a leak. FINDINGS: The gastrostomy tube balloon appeared to be stuck within the ventral abdominal wall but the gastrostomy tube was still communicating with the gastric lumen and there was no evidence for a leak. The balloon was  advanced back into the stomach lumen after viscous lidocaine was injected around the tube. At the end of the procedure, the gastrostomy tube was well positioned within the stomach. Currently, there is 4 mL of saline within the gastrostomy tube retention balloon. IMPRESSION: Successful repositioning of the gastrostomy tube balloon. Contrast injection confirmed placement in the stomach. No evidence for a leak at the gastrostomy tube site. Electronically Signed   By: Richarda Overlie M.D.   On: 03/21/2023 16:18   DG UGI W SINGLE CM (SOL OR THIN BA)  Result Date: 03/21/2023 CLINICAL DATA:  Gastric bypass with suspected leak on CT abdomen. EXAM: WATER SOLUBLE UPPER GI SERIES TECHNIQUE: Single-column upper GI series was performed using water soluble contrast. Radiation Exposure Index (as provided by the fluoroscopic device): 69.8 mGy Kerma CONTRAST:  50 cc Omnipaque 300. COMPARISON:  CT abdomen pelvis 03/21/2023. FLUOROSCOPY: Fluoroscopy Time: Radiation Exposure Index (if provided by the fluoroscopic device): 69.8 Number of Acquired Spot Images: 8 FINDINGS: Scout view of the abdomen shows postoperative changes in the left upper quadrant related to gastric bypass. Surgical clips in the right upper quadrant. Mild gaseous distension of colon. Residual excreted contrast in the intrarenal collecting systems and bladder from CT earlier today. Patient was imaged in the 45 degree recumbent AP, LPO and RPO positions. Initially, there is filling  of the gastric pouch with reflux of contrast into the esophagus. Subsequent imaging shows leakage of contrast from the distal gastric pouch, in the region of the anastomosis. IMPRESSION: Gastric bypass with a gastrojejunostomy anastomotic leak. Electronically Signed   By: Leanna Battles M.D.   On: 03/21/2023 15:24      [name] 03/23/2023  Patient ID: Christine [name], female   DOB: 07-23-1950, 72 y.o.   MRN: [id number]

## 2023-03-24 DIAGNOSIS — N179 Acute kidney failure, unspecified: Secondary | ICD-10-CM | POA: Diagnosis not present

## 2023-03-24 DIAGNOSIS — D649 Anemia, unspecified: Secondary | ICD-10-CM | POA: Diagnosis not present

## 2023-03-24 DIAGNOSIS — F419 Anxiety disorder, unspecified: Secondary | ICD-10-CM | POA: Diagnosis not present

## 2023-03-24 DIAGNOSIS — K631 Perforation of intestine (nontraumatic): Secondary | ICD-10-CM | POA: Diagnosis not present

## 2023-03-24 LAB — GLUCOSE, CAPILLARY
Glucose-Capillary: 95 mg/dL (ref 70–99)
Glucose-Capillary: 115 mg/dL — ABNORMAL HIGH (ref 70–99)
Glucose-Capillary: 111 mg/dL — ABNORMAL HIGH (ref 70–99)
Glucose-Capillary: 118 mg/dL — ABNORMAL HIGH (ref 70–99)
Glucose-Capillary: 99 mg/dL (ref 70–99)

## 2023-03-24 NOTE — Progress Notes (Signed)
Assessment & Plan: Perforated marginal ulcer - POD#3 - status post ex lap, abdominal washout, Graham patch repair of perforated gastrojejunal ulcer, drain placement - 03/21/2023 Dr. Freida Busman - NPO, IVF - IV Zosyn - PPI - monitor drain output - OOB, mobilize - appreciate TRH management - tentatively plan UGI series on Friday 11/29 prior to beginning any po diet         Maudry Diego, MD, FACS, FSSO Surgical Oncology, General Surgery, Trauma and Critical [facility], [place] [phone] for weekday/non holidays Check [email or web address] for coverage night/weekend/holidays           Chief Complaint: Perforated marginal ulcer  Subjective: Pt very "tired."  No flatus. No extensive belching.    Objective: Vital signs in last 24 hours: Temp:  [100.3 F (37.9 C)-100.9 F (38.3 C)] 100.3 F (37.9 C) (11/28 1200) Pulse Rate:  [100-128] 100 (11/28 1300) Resp:  [13-24] 15 (11/28 1300) BP: (110-145)/(49-131) 145/49 (11/28 1300) SpO2:  [97 %-100 %] 100 % (11/28 1300) Last BM Date : 03/24/23  Intake/Output from previous day: 11/27 0701 - 11/28 0700 In: 1863.7 [I.V.:1312.9; IV Piggyback:550.8] Out: 1165 [Urine:975; Drains:190] Intake/Output this shift: Total I/O In: 244.6 [I.V.:195.6; IV Piggyback:49] Out: 255 [Urine:200; Drains:55]  Physical Exam: HEENT - sclerae clear, mucous membranes moist Resp:  breathing comfortably. Sl pale.   Abdomen - soft, midline wound intact with wet to dry dressing in place; JP with serosanguinous output - small; gastrostomy plugged LUQ  Lab Results:  Recent Labs    03/23/23 0335 03/23/23 1950  WBC 16.1* 19.4*  HGB 10.3* 11.0*  HCT 34.4* 34.4*  PLT 249 192   BMET Recent Labs    03/23/23 0335 03/23/23 1950  NA 147* 143  K 3.3* 4.1  CL 113* 112*  CO2 22 21*  GLUCOSE 78 121*  BUN 36* 35*  CREATININE 2.11* 1.85*  CALCIUM 7.8* 7.4*   PT/INR No results for input(s): "LABPROT", "INR" in the last 72 hours. Comprehensive  Metabolic Panel:    Component Value Date/Time   NA 143 03/23/2023 1950   NA 147 (H) 03/23/2023 0335   K 4.1 03/23/2023 1950   K 3.3 (L) 03/23/2023 0335   CL 112 (H) 03/23/2023 1950   CL 113 (H) 03/23/2023 0335   CO2 21 (L) 03/23/2023 1950   CO2 22 03/23/2023 0335   BUN 35 (H) 03/23/2023 1950   BUN 36 (H) 03/23/2023 0335   CREATININE 1.85 (H) 03/23/2023 1950   CREATININE 2.11 (H) 03/23/2023 0335   CREATININE 0.91 07/26/2017 1127   GLUCOSE 121 (H) 03/23/2023 1950   GLUCOSE 78 03/23/2023 0335   CALCIUM 7.4 (L) 03/23/2023 1950   CALCIUM 7.8 (L) 03/23/2023 0335   AST 48 (H) 03/23/2023 0335   AST 33 03/21/2023 0715   AST 15 07/26/2017 1127   ALT 29 03/23/2023 0335   ALT 20 03/21/2023 0715   ALT 14 07/26/2017 1127   ALKPHOS 147 (H) 03/23/2023 0335   ALKPHOS 108 03/21/2023 0715   BILITOT 0.9 03/23/2023 0335   BILITOT 0.8 03/21/2023 0715   BILITOT 0.5 07/26/2017 1127   PROT 5.6 (L) 03/23/2023 0335   PROT 6.9 03/21/2023 0715   ALBUMIN 2.4 (L) 03/23/2023 0335   ALBUMIN 2.7 (L) 03/21/2023 0715    Studies/Results: ECHOCARDIOGRAM COMPLETE  Result Date: 03/23/2023    ECHOCARDIOGRAM REPORT   Patient Name:   Christine [name] Date of Exam: 03/23/2023 Medical Rec #:  [id number]      Height:  61.0 in Accession #:    [id number]     Weight:       227.1 lb Date of Birth:  March 14, 1951     BSA:          1.993 m Patient Age:    72 years       BP:           142/81 mmHg Patient Gender: F              HR:           120 bpm. Exam Location:  Inpatient Procedure: 2D Echo, Cardiac Doppler, Color Doppler and Intracardiac            Opacification Agent Indications:    Abnormal ECG  History:        Patient has prior history of Echocardiogram examinations, most                 recent 12/03/2019. Risk Factors:Dyslipidemia.  Sonographer:    Karma Ganja Referring Phys: [id number] DANIEL V THOMPSON  Sonographer Comments: No subcostal window and suboptimal apical window. Image acquisition challenging due to patient body  habitus and Image acquisition challenging due to uncooperative patient. IMPRESSIONS  1. Left ventricular ejection fraction, by estimation, is 50 to 55%. The left ventricle has low normal function. The left ventricle has no regional wall motion abnormalities. Indeterminate diastolic filling due to E-A fusion.  2. Right ventricular systolic function is normal. The right ventricular size is normal. There is mildly elevated pulmonary artery systolic pressure. The estimated right ventricular systolic pressure is 40.2 mmHg.  3. Left atrial size was mildly dilated.  4. The mitral valve is normal in structure. No evidence of mitral valve regurgitation. No evidence of mitral stenosis.  5. The aortic valve is tricuspid. There is mild calcification of the aortic valve. Aortic valve regurgitation is mild. Aortic valve sclerosis is present, with no evidence of aortic valve stenosis. FINDINGS  Left Ventricle: Left ventricular ejection fraction, by estimation, is 50 to 55%. The left ventricle has low normal function. The left ventricle has no regional wall motion abnormalities. Definity contrast agent was given IV to delineate the left ventricular endocardial borders. The left ventricular internal cavity size was normal in size. There is no left ventricular hypertrophy. Abnormal (paradoxical) septal motion, consistent with left bundle branch block. Indeterminate diastolic filling due to E-A fusion. Right Ventricle: The right ventricular size is normal. Right vetricular wall thickness was not well visualized. Right ventricular systolic function is normal. There is mildly elevated pulmonary artery systolic pressure. The tricuspid regurgitant velocity  is 3.05 m/s, and with an assumed right atrial pressure of 3 mmHg, the estimated right ventricular systolic pressure is 40.2 mmHg. Left Atrium: Left atrial size was mildly dilated. Right Atrium: Right atrial size was not well visualized. Pericardium: There is no evidence of pericardial  effusion. Mitral Valve: The mitral valve is normal in structure. No evidence of mitral valve regurgitation. No evidence of mitral valve stenosis. Tricuspid Valve: The tricuspid valve is normal in structure. Tricuspid valve regurgitation is trivial. Aortic Valve: The aortic valve is tricuspid. There is mild calcification of the aortic valve. Aortic valve regurgitation is mild. Aortic valve sclerosis is present, with no evidence of aortic valve stenosis. Aortic valve mean gradient measures 7.0 mmHg. Aortic valve peak gradient measures 13.7 mmHg. Aortic valve area, by VTI measures 1.90 cm. Pulmonic Valve: The pulmonic valve was not assessed. Pulmonic valve regurgitation is not visualized. No evidence of pulmonic stenosis.  Aorta: The aortic root and ascending aorta are structurally normal, with no evidence of dilitation. IAS/Shunts: The interatrial septum was not well visualized.  LEFT VENTRICLE PLAX 2D LVIDd:         3.45 cm     Diastology LVIDs:         2.60 cm     LV e' medial:    10.40 cm/s LV PW:         1.10 cm     LV E/e' medial:  9.6 LV IVS:        1.05 cm     LV e' lateral:   5.87 cm/s LVOT diam:     1.90 cm     LV E/e' lateral: 17.0 LV SV:         48 LV SV Index:   24 LVOT Area:     2.84 cm  LV Volumes (MOD) LV vol d, MOD A2C: 39.2 ml LV vol d, MOD A4C: 60.7 ml LV vol s, MOD A2C: 18.9 ml LV vol s, MOD A4C: 29.2 ml LV SV MOD A2C:     20.3 ml LV SV MOD A4C:     60.7 ml LV SV MOD BP:      26.7 ml RIGHT VENTRICLE RV S prime:     15.60 cm/s TAPSE (M-mode): 3.4 cm LEFT ATRIUM         Index LA diam:    4.00 cm 2.01 cm/m  AORTIC VALVE AV Area (Vmax):    1.93 cm AV Area (Vmean):   1.86 cm AV Area (VTI):     1.90 cm AV Vmax:           185.00 cm/s AV Vmean:          124.000 cm/s AV VTI:            0.252 m AV Peak Grad:      13.7 mmHg AV Mean Grad:      7.0 mmHg LVOT Vmax:         126.00 cm/s LVOT Vmean:        81.400 cm/s LVOT VTI:          0.169 m LVOT/AV VTI ratio: 0.67  AORTA Ao Root diam: 2.50 cm MITRAL VALVE                 TRICUSPID VALVE MV Area (PHT): 7.16 cm     TR Peak grad:   37.2 mmHg MV Decel Time: 106 msec     TR Vmax:        305.00 cm/s MV E velocity: 100.00 cm/s MV A velocity: 34.30 cm/s   SHUNTS MV E/A ratio:  2.92         Systemic VTI:  0.17 m                             Systemic Diam: 1.90 cm Rachelle Hora Croitoru MD Electronically signed by Thurmon Fair MD Signature Date/Time: 03/23/2023/4:15:40 PM    Final       Almond Lint 03/24/2023

## 2023-03-24 NOTE — Progress Notes (Signed)
PROGRESS NOTE    Christine [name]  XBJ:[id number] DOB: 1951-02-24 DOA: 03/21/2023 PCP: Pcp, No    Brief Narrative:   Patient is a 72 year old female history of hypertension, hyperlipidemia, OSA, GERD, morbid obesity, 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  Patient found to have intra-abdominal free air and fluid and concern for possible GJ anastomosis ulceration resulting in perforation.  Patient also noted to have significant abdominal pain and vomiting for 3 days prior to admission.  Patient was seen by general surgery and underwent  exploratory laparotomy, abdominal washout, Graham patch repair of perforated gastrojejunal ulcer, intraperitoneal drain placement.  Patient subsequently admitted to the stepdown unit for closer monitoring since hypotensive and also in acute renal failure.   Assessment & Plan:   Principal Problem:   Bowel perforation (HCC) Active Problems:   Normocytic anemia   GAD (generalized anxiety disorder)   Anxiety   Hyperlipidemia   Hypernatremia   ARF (acute renal failure) (HCC)   Hypotension   Metabolic acidosis   Perforated marginal ulcer Likely secondary to G-tube displacement versus GJ anastomosis ulceration.  Patient underwent  expiratory laparotomy, abdominal washout, Graham patch repair of perforated gastrojejunal ulcer, intraperitoneal drain placement on 03/21/2023.  Continue n.p.o., IV fluids, IV Zosyn PPI.   Further plans as per general surgery.  Metabolic encephalopathy.  Likely multifactorial from  surgery, IV narcotics and renal failure and hypoglycemia.  More interactive today.  Continue to monitor blood glucose levels.  Minimize narcotics if possible.  Hypoglycemia.  Reported by nursing staff.  IV fluid was changed to D10 f since yesterday since patient is n.p.o. status and has episodes of hypoglycemia with mental status  changes.  Will continue D10 for now.   Hypotension Resolved at this time.  Acute renal failure with metabolic acidosis  creatinine up to 2.04 on 03/22/2023 from 1.08 on admission.  Thought to be secondary to hypotension. Urine sodium < 10, urine creatinine 129.  Patient received 2 L of IV fluid bolus and was put on bicarb drip.  Continue intake and output charting Daily weights.  Creatinine today at 1.8 from 2.1 yesterday.  Patient is overall positive balance for 10,244 mL and had 1165 mL output last 24 hours.  Acidosis has improved and is off bicarb drip.  Hypokalemia.  Improved.  Latest potassium 4.1.  Probable UTI On IV Zosyn.  Urine culture with 60,000 colonies of E. coli.   New ST depression On admission.  No previous EKG. Admitting physician discussed with cardiology and recommended monitoring.  Patient did have tachycardia.  On-call feel may be a new left bundle branch block.  2D echocardiogram with LV function of 50 to 55% with no regional wall motion abnormality.. May need outpatient follow-up with cardiology.  GERD Continue PPI.  Hypernatremia Mild, likely secondary to dehydration.Urine sodium < 10.  Continue D5 water for now. Check BMP in AM.   Anemia Ferritin of 229, serum iron low at 11 TIBC low, folic acid normal at 19, vitamin B12 elevated at 1005. Continue to monitor CBC.  Transfuse for hemoglobin less than 8.   DVT prophylaxis: Lovenox subcu.  Code Status: DNR  Family Communication:  Spoke with the patient's sister [name] on the phone and updated her about the clinical condition of the patient on 03/23/2023.   Disposition: Uncertain at this time.  Status is: Inpatient Remains inpatient appropriate because: Severity of illness, pending clinical improvement,  Consultants:  IR General Surgery: Dr.Gerkin 03/21/2023  Procedures:   Gastrostomy tube injection manipulation with fluoroscopy per IR: Dr. Lowella Dandy 03/21/2023  Exploratory laparotomy, abdominal  washout, [name] patch repair of perforated gastrojejunal ulcer, intraperitoneal drain placement per general surgery: Dr. Freida Busman 03/21/2023  Antimicrobials:  Zosyn IV  Subjective  Today, patient was seen and examined at bedside.  Patient appears to be little more interactive today still on D10 infusion.    Repeats same answer saying yeah.    Objective: Vitals:   03/24/23 0800 03/24/23 1200 03/24/23 1258 03/24/23 1300  BP: (!) 136/57 (!) 132/58 (!) 145/49 (!) 145/49  Pulse: (!) 115  (!) 110 100  Resp: 14 13 13 15   Temp:  100.3 F (37.9 C)    TempSrc:  Axillary    SpO2: 100%  100% 100%  Weight:      Height:        Intake/Output Summary (Last 24 hours) at 03/24/2023 1410 Last data filed at 03/24/2023 1202 Gross per 24 hour  Intake 1250.97 ml  Output 1200 ml  Net 50.97 ml   Filed Weights   03/23/23 1024  Weight: 105.6 kg    Physical examination:  Body mass index is 43.99 kg/m.   General: Obese built, not in obvious distress, on room air HENT:   No scleral pallor or icterus noted. Oral mucosa is moist.  Chest:    Diminished breath sounds bilaterally. No crackles or wheezes.  CVS: S1 &S2 heard. No murmur.  Regular rate and rhythm.  Mildly tachycardic Abdomen: Diffuse tenderness on palpation, dressing in place with right lower quadrant JP drain.  G-tube in place.  Hypoactive bowel sounds.  Urethral catheter in place. Extremities: No cyanosis, clubbing or edema.   Psych: Mildly interactive, repeating the same CNS:  minimally interactive.   Moves all the extremities.  Skin: Warm and dry.  Surgical dressing in place in the abdomen.   Data Reviewed: I have personally reviewed following labs and imaging studies  CBC: Recent Labs  Lab 03/21/23 0715 03/22/23 0316 03/22/23 1037 03/23/23 0335 03/23/23 1950  WBC 6.9 14.3* 13.6* 16.1* 19.4*  NEUTROABS 5.5  --   --  13.4*  --   HGB 11.1* 9.7* 9.4* 10.3* 11.0*  HCT 36.0 33.5* 31.7* 34.4* 34.4*  MCV 96.0 101.2* 99.1 98.6  94.5  PLT 312 265 290 249 192    Basic Metabolic Panel: Recent Labs  Lab 03/21/23 0715 03/22/23 0316 03/23/23 0335 03/23/23 1950  NA 146* 145 147* 143  K 3.9 4.1 3.3* 4.1  CL 116* 118* 113* 112*  CO2 20* 17* 22 21*  GLUCOSE 90 79 78 121*  BUN 29* 32* 36* 35*  CREATININE 1.08* 2.04* 2.11* 1.85*  CALCIUM 8.9 7.8* 7.8* 7.4*  MG  --   --  1.3*  --   PHOS  --   --  3.0  --     GFR: Estimated Creatinine Clearance: 31.7 mL/min (A) (by C-G formula based on SCr of 1.85 mg/dL (H)).  Liver Function Tests: Recent Labs  Lab 03/21/23 0715 03/23/23 0335  AST 33 48*  ALT 20 29  ALKPHOS 108 147*  BILITOT 0.8 0.9  PROT 6.9 5.6*  ALBUMIN 2.7* 2.4*    CBG: Recent Labs  Lab 03/23/23 1949 03/23/23 2327 03/24/23 0343 03/24/23 0745 03/24/23 1143  GLUCAP 118* 101* 95 115* 111*     Recent Results (from the past 240 hour(s))  MRSA Next Gen by PCR, Nasal     Status: None  Collection Time: 03/21/23  8:21 PM   Specimen: Nasal Mucosa; Nasal Swab  Result Value Ref Range Status   MRSA by PCR Next Gen NOT DETECTED NOT DETECTED Final    Comment: (NOTE) The GeneXpert MRSA Assay (FDA approved for NASAL specimens only), is one component of a comprehensive MRSA colonization surveillance program. It is not intended to diagnose MRSA infection nor to guide or monitor treatment for MRSA infections. Test performance is not FDA approved in patients less than 34 years old. Performed at [facility], [address], [place], [place] [address]   Urine Culture (for pregnant, neutropenic or urologic patients or patients with an indwelling urinary catheter)     Status: Abnormal (Preliminary result)   Collection Time: 03/22/23 10:26 AM   Specimen: Urine, Catheterized  Result Value Ref Range Status   Specimen Description   Final    URINE, CATHETERIZED Performed at [facility], [address], [place], [place] [address]    Special Requests   Final     NONE Performed at [facility], [address] [place], [place] [address]    Culture (A)  Final    60,000 COLONIES/mL ESCHERICHIA COLI CONFIRMATION OF SUSCEPTIBILITIES IN PROGRESS Performed at [facility], [address], [place], [place] [address]    Report Status PENDING  Incomplete  Culture, blood (Routine X 2) w Reflex to ID Panel     Status: None (Preliminary result)   Collection Time: 03/22/23  9:16 PM   Specimen: BLOOD LEFT ARM  Result Value Ref Range Status   Specimen Description   Final    BLOOD LEFT ARM Performed at [facility], [address] [place], [place] [address]    Special Requests   Final    BOTTLES DRAWN AEROBIC ONLY Blood Culture results may not be optimal due to an inadequate volume of blood received in culture bottles Performed at [facility], [address], [place], [place] [address]    Culture   Final    NO GROWTH 2 DAYS Performed at [facility], [address], [place], [place] [address]    Report Status PENDING  Incomplete  Culture, blood (Routine X 2) w Reflex to ID Panel     Status: None (Preliminary result)   Collection Time: 03/22/23  9:16 PM   Specimen: BLOOD RIGHT HAND  Result Value Ref Range Status   Specimen Description   Final    BLOOD RIGHT HAND Performed at [facility], [address], [place], [place] [address]    Special Requests   Final    BOTTLES DRAWN AEROBIC ONLY Blood Culture results may not be optimal due to an inadequate volume of blood received in culture bottles Performed at [facility], [address], [place], [place] [address]    Culture   Final    NO GROWTH 2 DAYS Performed at [facility], [address] [place], [place] [address]    Report Status PENDING  Incomplete       Radiology Studies: ECHOCARDIOGRAM COMPLETE  Result Date: 03/23/2023    ECHOCARDIOGRAM REPORT   Patient Name:   VYVYAN BARTIE Date of Exam: 03/23/2023 Medical Rec #:  [id number]      Height:       61.0 in Accession #:    [id number]     Weight:       227.1 lb Date of Birth:  09/10/1950  BSA:          1.993 m Patient Age:    72 years       BP:           142/81 mmHg Patient Gender: F              HR:           120 bpm. Exam Location:  Inpatient Procedure: 2D Echo, Cardiac Doppler, Color Doppler and Intracardiac            Opacification Agent Indications:    Abnormal ECG  History:        Patient has prior history of Echocardiogram examinations, most                 recent 12/03/2019. Risk Factors:Dyslipidemia.  Sonographer:    Karma Ganja Referring Phys: [id number] DANIEL V THOMPSON  Sonographer Comments: No subcostal window and suboptimal apical window. Image acquisition challenging due to patient body habitus and Image acquisition challenging due to uncooperative patient. IMPRESSIONS  1. Left ventricular ejection fraction, by estimation, is 50 to 55%. The left ventricle has low normal function. The left ventricle has no regional wall motion abnormalities. Indeterminate diastolic filling due to E-A fusion.  2. Right ventricular systolic function is normal. The right ventricular size is normal. There is mildly elevated pulmonary artery systolic pressure. The estimated right ventricular systolic pressure is 40.2 mmHg.  3. Left atrial size was mildly dilated.  4. The mitral valve is normal in structure. No evidence of mitral valve regurgitation. No evidence of mitral stenosis.  5. The aortic valve is tricuspid. There is mild calcification of the aortic valve. Aortic valve regurgitation is mild. Aortic valve sclerosis is present, with no evidence of aortic valve stenosis. FINDINGS  Left Ventricle: Left ventricular ejection fraction, by estimation, is 50 to 55%. The left ventricle has low normal function. The left ventricle has no regional wall motion abnormalities. Definity contrast agent was given IV to delineate the left ventricular  endocardial borders. The left ventricular internal cavity size was normal in size. There is no left ventricular hypertrophy. Abnormal (paradoxical) septal motion, consistent with left bundle branch block. Indeterminate diastolic filling due to E-A fusion. Right Ventricle: The right ventricular size is normal. Right vetricular wall thickness was not well visualized. Right ventricular systolic function is normal. There is mildly elevated pulmonary artery systolic pressure. The tricuspid regurgitant velocity  is 3.05 m/s, and with an assumed right atrial pressure of 3 mmHg, the estimated right ventricular systolic pressure is 40.2 mmHg. Left Atrium: Left atrial size was mildly dilated. Right Atrium: Right atrial size was not well visualized. Pericardium: There is no evidence of pericardial effusion. Mitral Valve: The mitral valve is normal in structure. No evidence of mitral valve regurgitation. No evidence of mitral valve stenosis. Tricuspid Valve: The tricuspid valve is normal in structure. Tricuspid valve regurgitation is trivial. Aortic Valve: The aortic valve is tricuspid. There is mild calcification of the aortic valve. Aortic valve regurgitation is mild. Aortic valve sclerosis is present, with no evidence of aortic valve stenosis. Aortic valve mean gradient measures 7.0 mmHg. Aortic valve peak gradient measures 13.7 mmHg. Aortic valve area, by VTI measures 1.90 cm. Pulmonic Valve: The pulmonic valve was not assessed. Pulmonic valve regurgitation is not visualized. No evidence of pulmonic stenosis. Aorta: The aortic root and ascending aorta are structurally normal, with no evidence of dilitation. IAS/Shunts: The interatrial septum was not well visualized.  LEFT VENTRICLE PLAX 2D LVIDd:  3.45 cm     Diastology LVIDs:         2.60 cm     LV e' medial:    10.40 cm/s LV PW:         1.10 cm     LV E/e' medial:  9.6 LV IVS:        1.05 cm     LV e' lateral:   5.87 cm/s LVOT diam:     1.90 cm     LV E/e'  lateral: 17.0 LV SV:         48 LV SV Index:   24 LVOT Area:     2.84 cm  LV Volumes (MOD) LV vol d, MOD A2C: 39.2 ml LV vol d, MOD A4C: 60.7 ml LV vol s, MOD A2C: 18.9 ml LV vol s, MOD A4C: 29.2 ml LV SV MOD A2C:     20.3 ml LV SV MOD A4C:     60.7 ml LV SV MOD BP:      26.7 ml RIGHT VENTRICLE RV S prime:     15.60 cm/s TAPSE (M-mode): 3.4 cm LEFT ATRIUM         Index LA diam:    4.00 cm 2.01 cm/m  AORTIC VALVE AV Area (Vmax):    1.93 cm AV Area (Vmean):   1.86 cm AV Area (VTI):     1.90 cm AV Vmax:           185.00 cm/s AV Vmean:          124.000 cm/s AV VTI:            0.252 m AV Peak Grad:      13.7 mmHg AV Mean Grad:      7.0 mmHg LVOT Vmax:         126.00 cm/s LVOT Vmean:        81.400 cm/s LVOT VTI:          0.169 m LVOT/AV VTI ratio: 0.67  AORTA Ao Root diam: 2.50 cm MITRAL VALVE                TRICUSPID VALVE MV Area (PHT): 7.16 cm     TR Peak grad:   37.2 mmHg MV Decel Time: 106 msec     TR Vmax:        305.00 cm/s MV E velocity: 100.00 cm/s MV A velocity: 34.30 cm/s   SHUNTS MV E/A ratio:  2.92         Systemic VTI:  0.17 m                             Systemic Diam: 1.90 cm Mihai Croitoru MD Electronically signed by Thurmon Fair MD Signature Date/Time: 03/23/2023/4:15:40 PM    Final       Scheduled Meds:  Chlorhexidine Gluconate Cloth  6 each Topical Daily   enoxaparin (LOVENOX) injection  40 mg Subcutaneous Q24H   mouth rinse  15 mL Mouth Rinse 4 times per day   pantoprazole (PROTONIX) IV  40 mg Intravenous Q8H   Followed by   [START ON 03/25/2023] pantoprazole (PROTONIX) IV  40 mg Intravenous Q12H   Continuous Infusions:  dextrose 50 mL/hr at 03/24/23 0956   piperacillin-tazobactam (ZOSYN)  IV 3.375 g (03/24/23 1303)     LOS: 3 days     Joycelyn Das, MD [facility] 03/24/2023, 2:10 PM

## 2023-03-24 NOTE — Plan of Care (Signed)
Problem: Clinical Measurements: Goal: Ability to maintain clinical measurements within normal limits will improve Outcome: Progressing Goal: Diagnostic test results will improve Outcome: Progressing   Problem: Education: Goal: Knowledge of General Education information will improve Description: Including pain rating scale, medication(s)/side effects and non-pharmacologic comfort measures Outcome: Not Progressing   Problem: Health Behavior/Discharge Planning: Goal: Ability to manage health-related needs will improve Outcome: Not Progressing

## 2023-03-25 ENCOUNTER — Inpatient Hospital Stay (HOSPITAL_COMMUNITY): Payer: Medicare HMO

## 2023-03-25 DIAGNOSIS — D649 Anemia, unspecified: Secondary | ICD-10-CM | POA: Diagnosis not present

## 2023-03-25 DIAGNOSIS — K631 Perforation of intestine (nontraumatic): Secondary | ICD-10-CM | POA: Diagnosis not present

## 2023-03-25 DIAGNOSIS — N179 Acute kidney failure, unspecified: Secondary | ICD-10-CM | POA: Diagnosis not present

## 2023-03-25 DIAGNOSIS — F419 Anxiety disorder, unspecified: Secondary | ICD-10-CM | POA: Diagnosis not present

## 2023-03-25 LAB — MAGNESIUM: Magnesium: 1.4 mg/dL — ABNORMAL LOW (ref 1.7–2.4)

## 2023-03-25 LAB — URINE CULTURE: Culture: 60000 — AB

## 2023-03-25 LAB — CBC
HCT: 30.1 % — ABNORMAL LOW (ref 36.0–46.0)
Hemoglobin: 9.7 g/dL — ABNORMAL LOW (ref 12.0–15.0)
MCH: 29.9 pg (ref 26.0–34.0)
MCHC: 32.2 g/dL (ref 30.0–36.0)
MCV: 92.9 fL (ref 80.0–100.0)
Platelets: 259 10*3/uL (ref 150–400)
RBC: 3.24 MIL/uL — ABNORMAL LOW (ref 3.87–5.11)
RDW: 17.6 % — ABNORMAL HIGH (ref 11.5–15.5)
WBC: 13.2 10*3/uL — ABNORMAL HIGH (ref 4.0–10.5)
nRBC: 0 % (ref 0.0–0.2)

## 2023-03-25 LAB — BASIC METABOLIC PANEL
Anion gap: 9 (ref 5–15)
BUN: 32 mg/dL — ABNORMAL HIGH (ref 8–23)
CO2: 23 mmol/L (ref 22–32)
Calcium: 7.6 mg/dL — ABNORMAL LOW (ref 8.9–10.3)
Chloride: 109 mmol/L (ref 98–111)
Creatinine, Ser: 1.43 mg/dL — ABNORMAL HIGH (ref 0.44–1.00)
GFR, Estimated: 39 mL/min — ABNORMAL LOW (ref >60)
Glucose, Bld: 93 mg/dL (ref 70–99)
Potassium: 2.9 mmol/L — ABNORMAL LOW (ref 3.5–5.1)
Sodium: 141 mmol/L (ref 135–145)

## 2023-03-25 LAB — GLUCOSE, CAPILLARY
Glucose-Capillary: 75 mg/dL (ref 70–99)
Glucose-Capillary: 80 mg/dL (ref 70–99)
Glucose-Capillary: 83 mg/dL (ref 70–99)
Glucose-Capillary: 85 mg/dL (ref 70–99)
Glucose-Capillary: 86 mg/dL (ref 70–99)
Glucose-Capillary: 87 mg/dL (ref 70–99)
Glucose-Capillary: 98 mg/dL (ref 70–99)

## 2023-03-25 MED ORDER — IOHEXOL 9 MG/ML PO SOLN
500.0000 mL | ORAL | Status: AC
  Administered 2023-03-25 (×2): 50 mL via ORAL

## 2023-03-25 MED ORDER — ACETAMINOPHEN 10 MG/ML IV SOLN
1000.0000 mg | Freq: Four times a day (QID) | INTRAVENOUS | Status: AC | PRN
  Administered 2023-03-25: 1000 mg via INTRAVENOUS
  Filled 2023-03-25: qty 100

## 2023-03-25 MED ORDER — POTASSIUM CHLORIDE 10 MEQ/100ML IV SOLN
10.0000 meq | INTRAVENOUS | Status: AC
Start: 2023-03-25 — End: 2023-03-25
  Administered 2023-03-25 (×4): 10 meq via INTRAVENOUS
  Filled 2023-03-25 (×4): qty 100

## 2023-03-25 MED ORDER — ACETAMINOPHEN 650 MG RE SUPP: 650.0000 mg | RECTAL | Status: DC | PRN

## 2023-03-25 MED ORDER — MAGNESIUM SULFATE 4 GM/100ML IV SOLN
4.0000 g | Freq: Once | INTRAVENOUS | Status: AC
  Administered 2023-03-25: 4 g via INTRAVENOUS
  Filled 2023-03-25: qty 100

## 2023-03-25 NOTE — Progress Notes (Signed)
VAST consult received to obtain IV access. Left arm assessed with ultrasound; attempted to place USGIV in only vessel visualized, but unsuccessful. Assessed right arm utilizing ultrasound; able to place USGIV in only vessel visualized.   If patient needs further access, a PICC or CVC will be necessary.

## 2023-03-25 NOTE — Progress Notes (Signed)
PROGRESS NOTE    Ayrabella Vink  YNW:[id number] DOB: 10-Apr-1951 DOA: 03/21/2023 PCP: Pcp, No    Brief Narrative:   Patient is a 72 year old female history of hypertension, hyperlipidemia, OSA, GERD, morbid obesity, 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  Patient found to have intra-abdominal free air and fluid and concern for possible GJ anastomosis ulceration resulting in perforation.  Patient also noted to have significant abdominal pain and vomiting for 3 days prior to admission.  Patient was seen by general surgery and underwent  exploratory laparotomy, abdominal washout, Graham patch repair of perforated gastrojejunal ulcer, intraperitoneal drain placement.  Patient subsequently admitted to the stepdown unit for closer monitoring since hypotensive and also in acute renal failure.   Assessment & Plan:   Principal Problem:   Bowel perforation (HCC) Active Problems:   Normocytic anemia   GAD (generalized anxiety disorder)   Anxiety   Hyperlipidemia   Hypernatremia   ARF (acute renal failure) (HCC)   Hypotension   Metabolic acidosis   Perforated marginal ulcer Likely secondary to G-tube displacement versus GJ anastomosis ulceration.  Patient underwent  expiratory laparotomy, abdominal washout, Graham patch repair of perforated gastrojejunal ulcer, intraperitoneal drain placement on 03/21/2023.  Continue n.p.o., IV fluids, IV Zosyn PPI.   Further plans as per general surgery.  Plan is barium examination.  Has mild leukocytosis with a temperature max of 100.3 F.  Already on antibiotics with IV Zosyn.  Clinically looks better today.  Metabolic encephalopathy.  Likely multifactorial from  surgery, IV narcotics and renal failure and hypoglycemia.  Much improved today.  Alert awake and oriented.  Will continue D10 until p.o. intake is established.  Hypoglycemia.   Now improved with D10.  Still n.p.o. so we will continue for now.  Latest blood glucose level of 85   Hypotension Resolved at this time.  Acute renal failure with metabolic acidosis  creatinine up to 2.04 on 03/22/2023 from 1.08 on admission.  Thought to be secondary to hypotension. Urine sodium < 10, urine creatinine 129.  Patient received 2 L of IV fluid bolus and was put on bicarb drip.  Continue intake and output charting Daily weights.  Creatinine today at 1.8 from 2.1 yesterday.  Patient is overall positive balance for 10,244 mL and had 1165 mL output last 24 hours.  Acidosis has improved and is off bicarb drip.  Hypokalemia.  Improved.  Latest potassium 4.1.  Probable UTI On IV Zosyn.  Urine culture with 60,000 colonies of E. coli.   New ST depression On admission.  No previous EKG. Admitting physician discussed with cardiology and recommended monitoring.  Patient did have tachycardia.  On-call feel may be a new left bundle branch block.  2D echocardiogram with LV function of 50 to 55% with no regional wall motion abnormality.. May need outpatient follow-up with cardiology.  GERD Continue PPI.  Hypernatremia Improved at this time.  Latest sodium of 141.  On D10 at this time.  Significant hypokalemia.  Potassium of 2.9.  Will replace with 40 mEq of IV potassium.  Hypomagnesemia.  Magnesium of 1.4.  Will continue 4 g of magnesium sulfate today.   Anemia Ferritin of 229, serum iron low at 11 TIBC low, folic acid normal at 19, vitamin B12 elevated at 1005. Continue to monitor CBC.  Transfuse for hemoglobin less than 8.   DVT prophylaxis: Lovenox subcu.  Code Status: DNR  Family Communication:  Spoke with the patient's sister [name] on the phone and updated her about the clinical condition of the patient on 03/23/2023.   Disposition: Skilled nursing facility as per PT recommendation.  Status is: Inpatient Remains inpatient appropriate because: Severity of illness, pending  clinical improvement,   Consultants:  IR General Surgery: Dr.Gerkin 03/21/2023  Procedures:   Gastrostomy tube injection manipulation with fluoroscopy per IR: Dr. Lowella Dandy 03/21/2023  Exploratory laparotomy, abdominal washout, [name] patch repair of perforated gastrojejunal ulcer, intraperitoneal drain placement per general surgery: Dr. Freida Busman 03/21/2023  Antimicrobials:  Zosyn IV  Subjective  Today, patient was seen and examined at bedside.  Patient is more alert awake and oriented today.  Complains of abdominal discomfort.  Denies any nausea vomiting fever or chills.  Nursing staff reported that she tried to pull on the line yesterday.  Objective: Vitals:   03/25/23 0500 03/25/23 0700 03/25/23 0715 03/25/23 0800  BP:    (!) 139/45  Pulse: 90 90 91 91  Resp: 13 11 12 12   Temp:    98.6 F (37 C)  TempSrc:    Oral  SpO2: 100% 98% 99% 99%  Weight:      Height:        Intake/Output Summary (Last 24 hours) at 03/25/2023 0917 Last data filed at 03/25/2023 0815 Gross per 24 hour  Intake 1367.08 ml  Output 665 ml  Net 702.08 ml   Filed Weights   03/23/23 1024  Weight: 105.6 kg    Physical examination:  Body mass index is 43.99 kg/m.   General: Obese built, not in obvious distress, on room air, more alert awake and interactive, on mittens HENT:   No scleral pallor or icterus noted. Oral mucosa is moist.  Chest:    Diminished breath sounds bilaterally. No crackles or wheezes.  CVS: S1 &S2 heard. No murmur.  Regular rate and rhythm.  Mildly tachycardic Abdomen: Diffuse tenderness on palpation, dressing in place with right lower quadrant JP drain.  G-tube in place.  Hypoactive bowel sounds.  Urethral catheter in place. Extremities: No cyanosis, clubbing or edema.  Bilateral mittens noted Psych: Appears to be alert awake and oriented CNS: Moves all extremities, nonfocal Skin: Warm and dry.  Surgical dressing in place in the abdomen with drain.   Data Reviewed: I have  personally reviewed following labs and imaging studies  CBC: Recent Labs  Lab 03/21/23 0715 03/22/23 0316 03/22/23 1037 03/23/23 0335 03/23/23 1950 03/25/23 0329  WBC 6.9 14.3* 13.6* 16.1* 19.4* 13.2*  NEUTROABS 5.5  --   --  13.4*  --   --   HGB 11.1* 9.7* 9.4* 10.3* 11.0* 9.7*  HCT 36.0 33.5* 31.7* 34.4* 34.4* 30.1*  MCV 96.0 101.2* 99.1 98.6 94.5 92.9  PLT 312 265 290 249 192 259    Basic Metabolic Panel: Recent Labs  Lab 03/21/23 0715 03/22/23 0316 03/23/23 0335 03/23/23 1950 03/25/23 0329  NA 146* 145 147* 143 141  K 3.9 4.1 3.3* 4.1 2.9*  CL 116* 118* 113* 112* 109  CO2 20* 17* 22 21* 23  GLUCOSE 90 79 78 121* 93  BUN 29* 32* 36* 35* 32*  CREATININE 1.08* 2.04* 2.11* 1.85* 1.43*  CALCIUM 8.9 7.8* 7.8* 7.4* 7.6*  MG  --   --  1.3*  --  1.4*  PHOS  --   --  3.0  --   --     GFR: Estimated Creatinine Clearance: 41 mL/min (A) (by C-G formula based on SCr of 1.43 mg/dL (  H)).  Liver Function Tests: Recent Labs  Lab 03/21/23 0715 03/23/23 0335  AST 33 48*  ALT 20 29  ALKPHOS 108 147*  BILITOT 0.8 0.9  PROT 6.9 5.6*  ALBUMIN 2.7* 2.4*    CBG: Recent Labs  Lab 03/24/23 1548 03/24/23 1948 03/25/23 0000 03/25/23 0401 03/25/23 0822  GLUCAP 118* 99 86 75 85     Recent Results (from the past 240 hour(s))  MRSA Next Gen by PCR, Nasal     Status: None   Collection Time: 03/21/23  8:21 PM   Specimen: Nasal Mucosa; Nasal Swab  Result Value Ref Range Status   MRSA by PCR Next Gen NOT DETECTED NOT DETECTED Final    Comment: (NOTE) The GeneXpert MRSA Assay (FDA approved for NASAL specimens only), is one component of a comprehensive MRSA colonization surveillance program. It is not intended to diagnose MRSA infection nor to guide or monitor treatment for MRSA infections. Test performance is not FDA approved in patients less than 69 years old. Performed at [facility], [address] [place], [place] [address]   Urine Culture (for  pregnant, neutropenic or urologic patients or patients with an indwelling urinary catheter)     Status: Abnormal (Preliminary result)   Collection Time: 03/22/23 10:26 AM   Specimen: Urine, Catheterized  Result Value Ref Range Status   Specimen Description   Final    URINE, CATHETERIZED Performed at [facility], [address] [place], [place] [address]    Special Requests   Final    NONE Performed at [facility], [address] [place], [place] [address]    Culture (A)  Final    60,000 COLONIES/mL ESCHERICHIA COLI CONFIRMATION OF SUSCEPTIBILITIES IN PROGRESS Performed at [facility], [address], [place], [place] [address]    Report Status PENDING  Incomplete  Culture, blood (Routine X 2) w Reflex to ID Panel     Status: None (Preliminary result)   Collection Time: 03/22/23  9:16 PM   Specimen: BLOOD LEFT ARM  Result Value Ref Range Status   Specimen Description   Final    BLOOD LEFT ARM Performed at [facility], [address] [place], [place] [address]    Special Requests   Final    BOTTLES DRAWN AEROBIC ONLY Blood Culture results may not be optimal due to an inadequate volume of blood received in culture bottles Performed at [facility], [address] [place], [place] [address]    Culture   Final    NO GROWTH 2 DAYS Performed at [facility], [address] [place], [place] [address]    Report Status PENDING  Incomplete  Culture, blood (Routine X 2) w Reflex to ID Panel     Status: None (Preliminary result)   Collection Time: 03/22/23  9:16 PM   Specimen: BLOOD RIGHT HAND  Result Value Ref Range Status   Specimen Description   Final    BLOOD RIGHT HAND Performed at [facility], [address], [place], [place] [address]    Special Requests   Final    BOTTLES DRAWN AEROBIC ONLY Blood Culture results may not be optimal due to an inadequate volume of  blood received in culture bottles Performed at [facility], [address] [place], [place] [address]    Culture   Final    NO GROWTH 2 DAYS Performed at [facility], [address] [place], [place] [address]    Report Status PENDING  Incomplete       Radiology Studies: ECHOCARDIOGRAM COMPLETE  Result Date: 03/23/2023    ECHOCARDIOGRAM REPORT   Patient Name:   AKHILA ODONOHUE Date of Exam: 03/23/2023 Medical Rec #:  [id number]      Height:       61.0 in Accession #:    [id number]     Weight:       227.1 lb Date of Birth:  1950-08-10     BSA:          1.993 m Patient Age:    72 years       BP:           142/81 mmHg Patient Gender: F              HR:           120 bpm. Exam Location:  Inpatient Procedure: 2D Echo, Cardiac Doppler, Color Doppler and Intracardiac            Opacification Agent Indications:    Abnormal ECG  History:        Patient has prior history of Echocardiogram examinations, most                 recent 12/03/2019. Risk Factors:Dyslipidemia.  Sonographer:    Karma Ganja Referring Phys: [id number] DANIEL V THOMPSON  Sonographer Comments: No subcostal window and suboptimal apical window. Image acquisition challenging due to patient body habitus and Image acquisition challenging due to uncooperative patient. IMPRESSIONS  1. Left ventricular ejection fraction, by estimation, is 50 to 55%. The left ventricle has low normal function. The left ventricle has no regional wall motion abnormalities. Indeterminate diastolic filling due to E-A fusion.  2. Right ventricular systolic function is normal. The right ventricular size is normal. There is mildly elevated pulmonary artery systolic pressure. The estimated right ventricular systolic pressure is 40.2 mmHg.  3. Left atrial size was mildly dilated.  4. The mitral valve is normal in structure. No evidence of mitral valve regurgitation. No evidence of mitral stenosis.  5. The aortic valve is tricuspid. There is mild  calcification of the aortic valve. Aortic valve regurgitation is mild. Aortic valve sclerosis is present, with no evidence of aortic valve stenosis. FINDINGS  Left Ventricle: Left ventricular ejection fraction, by estimation, is 50 to 55%. The left ventricle has low normal function. The left ventricle has no regional wall motion abnormalities. Definity contrast agent was given IV to delineate the left ventricular endocardial borders. The left ventricular internal cavity size was normal in size. There is no left ventricular hypertrophy. Abnormal (paradoxical) septal motion, consistent with left bundle branch block. Indeterminate diastolic filling due to E-A fusion. Right Ventricle: The right ventricular size is normal. Right vetricular wall thickness was not well visualized. Right ventricular systolic function is normal. There is mildly elevated pulmonary artery systolic pressure. The tricuspid regurgitant velocity  is 3.05 m/s, and with an assumed right atrial pressure of 3 mmHg, the estimated right ventricular systolic pressure is 40.2 mmHg. Left Atrium: Left atrial size was mildly dilated. Right Atrium: Right atrial size was not well visualized. Pericardium: There is no evidence of pericardial effusion. Mitral Valve: The mitral valve is normal in structure. No evidence of mitral valve regurgitation. No evidence of mitral valve stenosis. Tricuspid Valve: The tricuspid valve is normal in structure. Tricuspid valve regurgitation is trivial. Aortic Valve: The aortic valve is tricuspid. There is mild calcification of  the aortic valve. Aortic valve regurgitation is mild. Aortic valve sclerosis is present, with no evidence of aortic valve stenosis. Aortic valve mean gradient measures 7.0 mmHg. Aortic valve peak gradient measures 13.7 mmHg. Aortic valve area, by VTI measures 1.90 cm. Pulmonic Valve: The pulmonic valve was not assessed. Pulmonic valve regurgitation is not visualized. No evidence of pulmonic stenosis.  Aorta: The aortic root and ascending aorta are structurally normal, with no evidence of dilitation. IAS/Shunts: The interatrial septum was not well visualized.  LEFT VENTRICLE PLAX 2D LVIDd:         3.45 cm     Diastology LVIDs:         2.60 cm     LV e' medial:    10.40 cm/s LV PW:         1.10 cm     LV E/e' medial:  9.6 LV IVS:        1.05 cm     LV e' lateral:   5.87 cm/s LVOT diam:     1.90 cm     LV E/e' lateral: 17.0 LV SV:         48 LV SV Index:   24 LVOT Area:     2.84 cm  LV Volumes (MOD) LV vol d, MOD A2C: 39.2 ml LV vol d, MOD A4C: 60.7 ml LV vol s, MOD A2C: 18.9 ml LV vol s, MOD A4C: 29.2 ml LV SV MOD A2C:     20.3 ml LV SV MOD A4C:     60.7 ml LV SV MOD BP:      26.7 ml RIGHT VENTRICLE RV S prime:     15.60 cm/s TAPSE (M-mode): 3.4 cm LEFT ATRIUM         Index LA diam:    4.00 cm 2.01 cm/m  AORTIC VALVE AV Area (Vmax):    1.93 cm AV Area (Vmean):   1.86 cm AV Area (VTI):     1.90 cm AV Vmax:           185.00 cm/s AV Vmean:          124.000 cm/s AV VTI:            0.252 m AV Peak Grad:      13.7 mmHg AV Mean Grad:      7.0 mmHg LVOT Vmax:         126.00 cm/s LVOT Vmean:        81.400 cm/s LVOT VTI:          0.169 m LVOT/AV VTI ratio: 0.67  AORTA Ao Root diam: 2.50 cm MITRAL VALVE                TRICUSPID VALVE MV Area (PHT): 7.16 cm     TR Peak grad:   37.2 mmHg MV Decel Time: 106 msec     TR Vmax:        305.00 cm/s MV E velocity: 100.00 cm/s MV A velocity: 34.30 cm/s   SHUNTS MV E/A ratio:  2.92         Systemic VTI:  0.17 m                             Systemic Diam: 1.90 cm Rachelle Hora Croitoru MD Electronically signed by Thurmon Fair MD Signature Date/Time: 03/23/2023/4:15:40 PM    Final       Scheduled Meds:  Chlorhexidine Gluconate Cloth  6 each Topical Daily  enoxaparin (LOVENOX) injection  40 mg Subcutaneous Q24H   mouth rinse  15 mL Mouth Rinse 4 times per day   pantoprazole (PROTONIX) IV  40 mg Intravenous Q12H   Continuous Infusions:  dextrose 50 mL/hr at 03/25/23 0815    magnesium sulfate bolus IVPB     piperacillin-tazobactam (ZOSYN)  IV 12.5 mL/hr at 03/25/23 0815   potassium chloride       LOS: 4 days     Joycelyn Das, MD [facility] 03/25/2023, 9:17 AM

## 2023-03-25 NOTE — Progress Notes (Signed)
    Assessment & Plan: Perforated marginal ulcer - POD#4 - status post ex lap, abdominal washout, Graham patch repair of perforated gastrojejunal ulcer, drain placement - 03/21/2023 Dr. Freida Busman - Hypokalemia per primary team - NPO, IVF - IV Zosyn - PPI - monitor drain output- looks good at this point  - OOB, mobilize - appreciate TRH management - tentatively plan UGI series today prior to beginning any po diet         Christine Diego, MD, FACS, FSSO Surgical Oncology, General Surgery, Trauma and Critical [facility], [place] [phone] for weekday/non holidays Check [email or web address] for coverage night/weekend/holidays           Chief Complaint: Perforated marginal ulcer  Subjective: Per nursing, pt delirious last night. Some improved this AM, but in soft mitten restraints for safety.   Objective: Vital signs in last 24 hours: Temp:  [97.6 F (36.4 C)-100.3 F (37.9 C)] 98.6 F (37 C) (11/29 0800) Pulse Rate:  [88-110] 91 (11/29 0800) Resp:  [11-15] 12 (11/29 0800) BP: (92-146)/(45-67) 139/45 (11/29 0800) SpO2:  [97 %-100 %] 99 % (11/29 0800) Last BM Date : 03/24/23  Intake/Output from previous day: 11/28 0701 - 11/29 0700 In: 651.1 [I.V.:552.1; IV Piggyback:99] Out: 720 [Urine:600; Drains:120] Intake/Output this shift: Total I/O In: 716 [I.V.:646.2; IV Piggyback:69.8] Out: 0   Physical Exam: HEENT - sclerae clear, mucous membranes moist Resp:  breathing comfortably. Flushed today in face.  Abdomen - soft, midline wound intact with wet to dry dressing in place; JP with serosanguinous output - small; gastrostomy plugged LUQ  Lab Results:  Recent Labs    03/23/23 1950 03/25/23 0329  WBC 19.4* 13.2*  HGB 11.0* 9.7*  HCT 34.4* 30.1*  PLT 192 259   BMET Recent Labs    03/23/23 1950 03/25/23 0329  NA 143 141  K 4.1 2.9*  CL 112* 109  CO2 21* 23  GLUCOSE 121* 93  BUN 35* 32*  CREATININE 1.85* 1.43*  CALCIUM 7.4* 7.6*   PT/INR No results  for input(s): "LABPROT", "INR" in the last 72 hours. Comprehensive Metabolic Panel:    Component Value Date/Time   NA 141 03/25/2023 0329   NA 143 03/23/2023 1950   K 2.9 (L) 03/25/2023 0329   K 4.1 03/23/2023 1950   CL 109 03/25/2023 0329   CL 112 (H) 03/23/2023 1950   CO2 23 03/25/2023 0329   CO2 21 (L) 03/23/2023 1950   BUN 32 (H) 03/25/2023 0329   BUN 35 (H) 03/23/2023 1950   CREATININE 1.43 (H) 03/25/2023 0329   CREATININE 1.85 (H) 03/23/2023 1950   CREATININE 0.91 07/26/2017 1127   GLUCOSE 93 03/25/2023 0329   GLUCOSE 121 (H) 03/23/2023 1950   CALCIUM 7.6 (L) 03/25/2023 0329   CALCIUM 7.4 (L) 03/23/2023 1950   AST 48 (H) 03/23/2023 0335   AST 33 03/21/2023 0715   AST 15 07/26/2017 1127   ALT 29 03/23/2023 0335   ALT 20 03/21/2023 0715   ALT 14 07/26/2017 1127   ALKPHOS 147 (H) 03/23/2023 0335   ALKPHOS 108 03/21/2023 0715   BILITOT 0.9 03/23/2023 0335   BILITOT 0.8 03/21/2023 0715   BILITOT 0.5 07/26/2017 1127   PROT 5.6 (L) 03/23/2023 0335   PROT 6.9 03/21/2023 0715   ALBUMIN 2.4 (L) 03/23/2023 0335   ALBUMIN 2.7 (L) 03/21/2023 0715    Studies/Results: No results found.    Almond Lint 03/25/2023

## 2023-03-25 NOTE — Plan of Care (Signed)

## 2023-03-25 NOTE — Progress Notes (Signed)
Paged Surgery on call Dr. Carolynne Edouard, please continue NPO orders, because patient has not passed gas.   Can administer Tylenol IV.

## 2023-03-26 DIAGNOSIS — F419 Anxiety disorder, unspecified: Secondary | ICD-10-CM | POA: Diagnosis not present

## 2023-03-26 DIAGNOSIS — D649 Anemia, unspecified: Secondary | ICD-10-CM | POA: Diagnosis not present

## 2023-03-26 DIAGNOSIS — N179 Acute kidney failure, unspecified: Secondary | ICD-10-CM | POA: Diagnosis not present

## 2023-03-26 DIAGNOSIS — K631 Perforation of intestine (nontraumatic): Secondary | ICD-10-CM | POA: Diagnosis not present

## 2023-03-26 LAB — COMPREHENSIVE METABOLIC PANEL
ALT: 23 U/L (ref 0–44)
AST: 33 U/L (ref 15–41)
Albumin: 1.9 g/dL — ABNORMAL LOW (ref 3.5–5.0)
Alkaline Phosphatase: 177 U/L — ABNORMAL HIGH (ref 38–126)
Anion gap: 9 (ref 5–15)
BUN: 26 mg/dL — ABNORMAL HIGH (ref 8–23)
CO2: 23 mmol/L (ref 22–32)
Calcium: 7.8 mg/dL — ABNORMAL LOW (ref 8.9–10.3)
Chloride: 111 mmol/L (ref 98–111)
Creatinine, Ser: 1.23 mg/dL — ABNORMAL HIGH (ref 0.44–1.00)
GFR, Estimated: 47 mL/min — ABNORMAL LOW (ref >60)
Glucose, Bld: 81 mg/dL (ref 70–99)
Potassium: 3.1 mmol/L — ABNORMAL LOW (ref 3.5–5.1)
Sodium: 143 mmol/L (ref 135–145)
Total Bilirubin: 0.7 mg/dL (ref <1.2)
Total Protein: 5.2 g/dL — ABNORMAL LOW (ref 6.5–8.1)

## 2023-03-26 LAB — GLUCOSE, CAPILLARY
Glucose-Capillary: 106 mg/dL — ABNORMAL HIGH (ref 70–99)
Glucose-Capillary: 121 mg/dL — ABNORMAL HIGH (ref 70–99)
Glucose-Capillary: 42 mg/dL — CL (ref 70–99)
Glucose-Capillary: 55 mg/dL — ABNORMAL LOW (ref 70–99)
Glucose-Capillary: 62 mg/dL — ABNORMAL LOW (ref 70–99)
Glucose-Capillary: 68 mg/dL — ABNORMAL LOW (ref 70–99)
Glucose-Capillary: 81 mg/dL (ref 70–99)
Glucose-Capillary: 84 mg/dL (ref 70–99)
Glucose-Capillary: 84 mg/dL (ref 70–99)
Glucose-Capillary: 93 mg/dL (ref 70–99)

## 2023-03-26 LAB — CBC
HCT: 31.8 % — ABNORMAL LOW (ref 36.0–46.0)
Hemoglobin: 9.7 g/dL — ABNORMAL LOW (ref 12.0–15.0)
MCH: 29.4 pg (ref 26.0–34.0)
MCHC: 30.5 g/dL (ref 30.0–36.0)
MCV: 96.4 fL (ref 80.0–100.0)
Platelets: 228 10*3/uL (ref 150–400)
RBC: 3.3 MIL/uL — ABNORMAL LOW (ref 3.87–5.11)
RDW: 17.6 % — ABNORMAL HIGH (ref 11.5–15.5)
WBC: 11 10*3/uL — ABNORMAL HIGH (ref 4.0–10.5)
nRBC: 0 % (ref 0.0–0.2)

## 2023-03-26 LAB — PHOSPHORUS: Phosphorus: 3.1 mg/dL (ref 2.5–4.6)

## 2023-03-26 LAB — MAGNESIUM: Magnesium: 2.4 mg/dL (ref 1.7–2.4)

## 2023-03-26 MED ORDER — DEXTROSE 50 % IV SOLN
25.0000 mL | Freq: Once | INTRAVENOUS | Status: AC
  Administered 2023-03-26: 50 mL via INTRAVENOUS
  Filled 2023-03-26: qty 50

## 2023-03-26 MED ORDER — HYDROMORPHONE HCL 1 MG/ML IJ SOLN
0.5000 mg | INTRAMUSCULAR | Status: DC | PRN
  Administered 2023-03-26 – 2023-03-28 (×7): 0.5 mg via INTRAVENOUS
  Filled 2023-03-26: qty 0.5
  Filled 2023-03-26 (×2): qty 1
  Filled 2023-03-26: qty 0.5
  Filled 2023-03-26 (×4): qty 1

## 2023-03-26 MED ORDER — DEXTROSE 50 % IV SOLN
12.5000 g | INTRAVENOUS | Status: AC
  Administered 2023-03-26: 12.5 g via INTRAVENOUS
  Filled 2023-03-26: qty 50

## 2023-03-26 MED ORDER — POTASSIUM CHLORIDE 10 MEQ/100ML IV SOLN
10.0000 meq | INTRAVENOUS | Status: AC
  Administered 2023-03-26 (×6): 10 meq via INTRAVENOUS
  Filled 2023-03-26 (×6): qty 100

## 2023-03-26 MED ORDER — HYDRALAZINE HCL 20 MG/ML IJ SOLN
5.0000 mg | Freq: Four times a day (QID) | INTRAMUSCULAR | Status: AC | PRN
  Administered 2023-03-26 – 2023-03-27 (×2): 5 mg via INTRAVENOUS
  Filled 2023-03-26 (×2): qty 1

## 2023-03-26 NOTE — Progress Notes (Signed)
Hypoglycemic Protocol  Patient's sugar was 42.  Patient was NPO at the time. According to hypoglycemic protocol:  Gave 25 g D50 IV (50 ml IV) Rechecked in 30 min and sugar was 124.  Odis Luster, RN

## 2023-03-26 NOTE — Progress Notes (Signed)
Hypoglycemic Protocol  Patient's sugar was 55.  Patient is on clear liquids. According to hypoglycemic protocol: Gave 4oz of apple juice and a Mango italian ice Checked BS 30 min later and CBG was 88.  Odis Luster, RN

## 2023-03-26 NOTE — Progress Notes (Signed)
PROGRESS NOTE    Christine [name]  ZOX:[id number] DOB: 1950/08/20 DOA: 03/21/2023 PCP: Pcp, No    Brief Narrative:   Patient is a 72 year old female history of hypertension, hyperlipidemia, OSA, GERD, morbid obesity, 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  Patient found to have intra-abdominal free air and fluid and concern for possible GJ anastomosis ulceration resulting in perforation.  Patient also noted to have significant abdominal pain and vomiting for 3 days prior to admission.  Patient was seen by general surgery and underwent  exploratory laparotomy, abdominal washout, Graham patch repair of perforated gastrojejunal ulcer, intraperitoneal drain placement.  Patient subsequently admitted to the stepdown unit for closer monitoring since hypotensive and also in acute renal failure.   Assessment & Plan:   Principal Problem:   Bowel perforation (HCC) Active Problems:   Normocytic anemia   GAD (generalized anxiety disorder)   Anxiety   Hyperlipidemia   Hypernatremia   ARF (acute renal failure) (HCC)   Hypotension   Metabolic acidosis   Perforated marginal ulcer Likely secondary to G-tube displacement versus GJ anastomosis ulceration.  Patient underwent  expiratory laparotomy, abdominal washout, Graham patch repair of perforated gastrojejunal ulcer, intraperitoneal drain placement on 03/21/2023.  Seen by general surgery today and has been started on clears.  Continue, IV fluids, IV Zosyn PPI.   Further plans as per general surgery. Has mild leukocytosis with a temperature max of 98.  General surgery following.  Metabolic encephalopathy.  Resolved.  Likely multifactorial from  surgery, IV narcotics and renal failure and hypoglycemia.  Much improved today.  Will decrease D10 when oral intake is adequate.    Hypoglycemia.  Now improved with D10.  Started on clears  today.  Latest glucose of 84.  Will continue.   Hypotension Resolved at this time.  Acute renal failure with metabolic acidosis Creatinine today at 1.2 significantly improved.  Received IV fluids.  Creatinine up to 2.04 on 03/22/2023 continue to monitor intake and output charting.  Hypokalemia.  Replenished with IV potassium, replenished with 60 mEq.Marland Kitchen  Potassium of 3.1 today.  Probable UTI On IV Zosyn.  Urine culture with 60,000 colonies of E. coli resistant to multiple antibiotics except Zosyn.  Currently on Zosyn will continue.   New ST depression On admission.  No previous EKG. Admitting physician discussed with cardiology and recommended monitoring.  Patient did have tachycardia.  2D echocardiogram with LV function of 50 to 55% with no regional wall motion abnormality.. May need outpatient follow-up with cardiology.  GERD Continue PPI.  Hypernatremia Improved at this time.  Improved.  Latest sodium of 143.  Hypomagnesemia.  Received 4 g of magnesium sulfate yesterday.  Magnesium of 2.1 today.  Check magnesium level in AM.   Anemia Ferritin of 229, serum iron low at 11 TIBC low, folic acid normal at 19, vitamin B12 elevated at 1005. Continue to monitor CBC.  Transfuse for hemoglobin less than 8.   DVT prophylaxis: Lovenox subcu.  Code Status: DNR  Family Communication:  Spoke with the patient's sister [name] at bedside on 03/26/2023  Disposition: Skilled nursing facility as per PT recommendation.  Status is: Inpatient Remains inpatient appropriate because: Severity of illness, pending clinical improvement,   Consultants:  IR General Surgery: Dr.Gerkin 03/21/2023  Procedures:   Gastrostomy tube injection manipulation with fluoroscopy per IR: Dr. Lowella Dandy 03/21/2023  Exploratory laparotomy, abdominal washout, [name] patch repair of perforated gastrojejunal ulcer,  intraperitoneal drain placement per general surgery: Dr. Freida Busman 03/21/2023  Antimicrobials:  Zosyn  IV  Subjective  Today, patient was seen and examined at bedside.  Patient's sister at bedside.  Alert awake and Communicative.  Denies overt pain.  No nausea vomiting.  Has had 3-4 bowel movements.  General surgery has started the patient on clears.  States she has not been able to sleep well.   Objective: Vitals:   03/26/23 0523 03/26/23 0600 03/26/23 0700 03/26/23 0835  BP: (!) 173/72 (!) 157/46    Pulse:  86 100   Resp:  20 (!) 22   Temp:    97.7 F (36.5 C)  TempSrc:    Oral  SpO2:  95% 92%   Weight:      Height:        Intake/Output Summary (Last 24 hours) at 03/26/2023 1144 Last data filed at 03/26/2023 0731 Gross per 24 hour  Intake 1571.12 ml  Output 865 ml  Net 706.12 ml   Filed Weights   03/23/23 1024  Weight: 105.6 kg    Physical examination:  Body mass index is 43.99 kg/m.   General: Obese built, not in obvious distress, on room air, alert awake and oriented.   HENT:   No scleral pallor or icterus noted. Oral mucosa is moist.  Chest:    Diminished breath sounds bilaterally. No crackles or wheezes.  CVS: S1 &S2 heard. No murmur.  Regular rate and rhythm.  Mildly tachycardic Abdomen: Diffuse tenderness on palpation, dressing in place with right lower quadrant JP drain.  Marland Kitchen  Hypoactive bowel sounds.  Urethral catheter in place. Extremities: No cyanosis, clubbing or edema.   Psych: Appears to be alert awake and oriented CNS: Moves all extremities, nonfocal Skin: Warm and dry.  Surgical dressing in place in the abdomen with drain.   Data Reviewed: I have personally reviewed following labs and imaging studies  CBC: Recent Labs  Lab 03/21/23 0715 03/22/23 0316 03/22/23 1037 03/23/23 0335 03/23/23 1950 03/25/23 0329 03/26/23 0338  WBC 6.9   < > 13.6* 16.1* 19.4* 13.2* 11.0*  NEUTROABS 5.5  --   --  13.4*  --   --   --   HGB 11.1*   < > 9.4* 10.3* 11.0* 9.7* 9.7*  HCT 36.0   < > 31.7* 34.4* 34.4* 30.1* 31.8*  MCV 96.0   < > 99.1 98.6 94.5 92.9 96.4   PLT 312   < > 290 249 192 259 228   < > = values in this interval not displayed.    Basic Metabolic Panel: Recent Labs  Lab 03/22/23 0316 03/23/23 0335 03/23/23 1950 03/25/23 0329 03/26/23 0338  NA 145 147* 143 141 143  K 4.1 3.3* 4.1 2.9* 3.1*  CL 118* 113* 112* 109 111  CO2 17* 22 21* 23 23  GLUCOSE 79 78 121* 93 81  BUN 32* 36* 35* 32* 26*  CREATININE 2.04* 2.11* 1.85* 1.43* 1.23*  CALCIUM 7.8* 7.8* 7.4* 7.6* 7.8*  MG  --  1.3*  --  1.4* 2.4  PHOS  --  3.0  --   --  3.1    GFR: Estimated Creatinine Clearance: 47.6 mL/min (A) (by C-G formula based on SCr of 1.23 mg/dL (H)).  Liver Function Tests: Recent Labs  Lab 03/21/23 0715 03/23/23 0335 03/26/23 0338  AST 33 48* 33  ALT 20 29 23   ALKPHOS 108 147* 177*  BILITOT 0.8 0.9 0.7  PROT 6.9 5.6* 5.2*  ALBUMIN 2.7* 2.4*  1.9*    CBG: Recent Labs  Lab 03/26/23 0338 03/26/23 0425 03/26/23 0817 03/26/23 0924 03/26/23 1134  GLUCAP 62* 106* 42* 121* 84     Recent Results (from the past 240 hour(s))  MRSA Next Gen by PCR, Nasal     Status: None   Collection Time: 03/21/23  8:21 PM   Specimen: Nasal Mucosa; Nasal Swab  Result Value Ref Range Status   MRSA by PCR Next Gen NOT DETECTED NOT DETECTED Final    Comment: (NOTE) The GeneXpert MRSA Assay (FDA approved for NASAL specimens only), is one component of a comprehensive MRSA colonization surveillance program. It is not intended to diagnose MRSA infection nor to guide or monitor treatment for MRSA infections. Test performance is not FDA approved in patients less than 69 years old. Performed at [facility], [address] [place], [place] [address]   Urine Culture (for pregnant, neutropenic or urologic patients or patients with an indwelling urinary catheter)     Status: Abnormal   Collection Time: 03/22/23 10:26 AM   Specimen: Urine, Catheterized  Result Value Ref Range Status   Specimen Description   Final    URINE,  CATHETERIZED Performed at [facility], [address], [place], [place] [address]    Special Requests   Final    NONE Performed at [facility], [address] [place], [place] [address]    Culture (A)  Final    60,000 COLONIES/mL ESCHERICHIA COLI Confirmed Extended Spectrum Beta-Lactamase Producer (ESBL).  In bloodstream infections from ESBL organisms, carbapenems are preferred over piperacillin/tazobactam. They are shown to have a lower risk of mortality.    Report Status 03/25/2023 FINAL  Final   Organism ID, Bacteria ESCHERICHIA COLI (A)  Final      Susceptibility   Escherichia coli - MIC*    AMPICILLIN >=32 RESISTANT Resistant     CEFAZOLIN >=64 RESISTANT Resistant     CEFEPIME >=32 RESISTANT Resistant     CEFTRIAXONE >=64 RESISTANT Resistant     CIPROFLOXACIN >=4 RESISTANT Resistant     GENTAMICIN >=16 RESISTANT Resistant     IMIPENEM <=0.25 SENSITIVE Sensitive     NITROFURANTOIN <=16 SENSITIVE Sensitive     TRIMETH/SULFA >=320 RESISTANT Resistant     AMPICILLIN/SULBACTAM 16 INTERMEDIATE Intermediate     PIP/TAZO <=4 SENSITIVE Sensitive ug/mL    * 60,000 COLONIES/mL ESCHERICHIA COLI  Culture, blood (Routine X 2) w Reflex to ID Panel     Status: None (Preliminary result)   Collection Time: 03/22/23  9:16 PM   Specimen: BLOOD LEFT ARM  Result Value Ref Range Status   Specimen Description   Final    BLOOD LEFT ARM Performed at [facility], [address], [place], [place] [address]    Special Requests   Final    BOTTLES DRAWN AEROBIC ONLY Blood Culture results may not be optimal due to an inadequate volume of blood received in culture bottles Performed at [facility], [address] [place], [place] [address]    Culture   Final    NO GROWTH 4 DAYS Performed at [facility], [address] [place], [place] [address]    Report Status PENDING  Incomplete  Culture, blood (Routine X 2) w  Reflex to ID Panel     Status: None (Preliminary result)   Collection Time: 03/22/23  9:16 PM   Specimen: BLOOD RIGHT HAND  Result Value Ref Range Status  Specimen Description   Final    BLOOD RIGHT HAND Performed at [facility], [address], [place], [place] [address]    Special Requests   Final    BOTTLES DRAWN AEROBIC ONLY Blood Culture results may not be optimal due to an inadequate volume of blood received in culture bottles Performed at [facility], [address], [place], [place] [address]    Culture   Final    NO GROWTH 4 DAYS Performed at [facility], [address] [place], [place] [address]    Report Status PENDING  Incomplete       Radiology Studies: DG UGI W SINGLE CM (SOL OR THIN BA)  Result Date: 03/25/2023 CLINICAL DATA:  [id number] Perforated ulcer (HCC) 425956. Postoperative. Rule out leak. EXAM: WATER SOLUBLE UPPER GI SERIES TECHNIQUE: Single-column upper GI series was performed using water soluble contrast. Radiation Exposure Index (as provided by the fluoroscopic device): 289.6 mGy Kerma CONTRAST:  100 mL of Omnipaque 300. COMPARISON:  None Available. FLUOROSCOPY: Fluoroscopy Time:  3 minutes FINDINGS: Scout: No free air under the domes of diaphragm. Left lung base and left lateral costophrenic angle appear clear. Patient is status post repair of perforated marginal ulcer. No evidence of leak. There is normal passage of barium from the esophagus into the proximal stomach and from the stomach into the jejunum via gastrojejunostomy anastomosis. No abnormal holdup. No large ulcer seen. The excluded stomach was not opacified, as expected. There is also smooth passage of contrast into the distal jejunum and via jejunojejunostomy. No abnormal holdup. No large ulcer seen. IMPRESSION: *No evidence of leak.  Please see above for details. Electronically Signed   By: Jules Schick M.D.   On: 03/25/2023 11:00      Scheduled  Meds:  Chlorhexidine Gluconate Cloth  6 each Topical Daily   enoxaparin (LOVENOX) injection  40 mg Subcutaneous Q24H   mouth rinse  15 mL Mouth Rinse 4 times per day   pantoprazole (PROTONIX) IV  40 mg Intravenous Q12H   Continuous Infusions:  acetaminophen Stopped (03/25/23 1631)   dextrose 50 mL/hr at 03/26/23 0731   piperacillin-tazobactam (ZOSYN)  IV Stopped (03/26/23 1000)   potassium chloride 10 mEq (03/26/23 1105)     LOS: 5 days     Joycelyn Das, MD [facility] 03/26/2023, 11:44 AM

## 2023-03-26 NOTE — Plan of Care (Signed)
  Problem: Education: Goal: Knowledge of General Education information will improve Description: Including pain rating scale, medication(s)/side effects and non-pharmacologic comfort measures Outcome: Progressing   Problem: Clinical Measurements: Goal: Respiratory complications will improve Outcome: Progressing Goal: Cardiovascular complication will be avoided Outcome: Progressing   Problem: Activity: Goal: Risk for activity intolerance will decrease Outcome: Progressing   Problem: Coping: Goal: Level of anxiety will decrease Outcome: Progressing   Problem: Elimination: Goal: Will not experience complications related to bowel motility Outcome: Progressing Goal: Will not experience complications related to urinary retention Outcome: Progressing   Problem: Pain Management: Goal: General experience of comfort will improve Outcome: Progressing   Problem: Safety: Goal: Ability to remain free from injury will improve Outcome: Progressing   Problem: Nutrition: Goal: Adequate nutrition will be maintained Outcome: Not Progressing

## 2023-03-26 NOTE — Progress Notes (Signed)
Assessment & Plan: Perforated marginal ulcer - POD#5 - status post ex lap, abdominal washout, Graham patch repair of perforated gastrojejunal ulcer, drain placement - 03/21/2023 Dr. Freida Busman - Hypokalemia per primary team - ok to start bari clears today. Order written.  - Leukocytosis continues to improve. IV Zosyn x 5 days. Ok to stop after last dose today.  - PPI to continue iv for now.  - monitor drain output- looks good at this point  - AKI resolving.  - OOB, mobilize - appreciate TRH management  Would hold oral meds today and tomorrow ok to start liquid meds if does ok with clear liquids today.           Christine Diego, MD, FACS, FSSO Surgical Oncology, General Surgery, Trauma and Critical [facility] Surgery, [place] [phone] for weekday/non holidays Check [email or web address] for general surgery coverage night/weekend/holidays           Chief Complaint: Perforated marginal ulcer  Subjective: Was much more clear overnight.   Objective: Vital signs in last 24 hours: Temp:  [97.5 F (36.4 C)-98 F (36.7 C)] 97.5 F (36.4 C) (11/30 0400) Pulse Rate:  [81-104] 100 (11/30 0700) Resp:  [10-22] 22 (11/30 0700) BP: (122-179)/(46-125) 157/46 (11/30 0600) SpO2:  [92 %-100 %] 92 % (11/30 0700) Last BM Date : 03/25/23  Intake/Output from previous day: 11/29 0701 - 11/30 0700 In: 1892.4 [I.V.:1151.7; IV Piggyback:740.7] Out: 865 [Urine:800; Drains:65] Intake/Output this shift: Total I/O In: 723.3 [I.V.:647.4; IV Piggyback:75.9] Out: -   Physical Exam: HEENT - sclerae clear, mucous membranes moist. Talking on phone easily. No respiratory distress with animated conversation. Not flushed today.  Abdomen - soft, midline wound intact with wet to dry dressing in place; JP with serosanguinous output - small; gastrostomy plugged LUQ. Midline wound with good granulation tissue  Lab Results:  Recent Labs    03/25/23 0329 03/26/23 0338  WBC 13.2* 11.0*  HGB 9.7* 9.7*  HCT  30.1* 31.8*  PLT 259 228   BMET Recent Labs    03/25/23 0329 03/26/23 0338  NA 141 143  K 2.9* 3.1*  CL 109 111  CO2 23 23  GLUCOSE 93 81  BUN 32* 26*  CREATININE 1.43* 1.23*  CALCIUM 7.6* 7.8*   PT/INR No results for input(s): "LABPROT", "INR" in the last 72 hours. Comprehensive Metabolic Panel:    Component Value Date/Time   NA 143 03/26/2023 0338   NA 141 03/25/2023 0329   K 3.1 (L) 03/26/2023 0338   K 2.9 (L) 03/25/2023 0329   CL 111 03/26/2023 0338   CL 109 03/25/2023 0329   CO2 23 03/26/2023 0338   CO2 23 03/25/2023 0329   BUN 26 (H) 03/26/2023 0338   BUN 32 (H) 03/25/2023 0329   CREATININE 1.23 (H) 03/26/2023 0338   CREATININE 1.43 (H) 03/25/2023 0329   CREATININE 0.91 07/26/2017 1127   GLUCOSE 81 03/26/2023 0338   GLUCOSE 93 03/25/2023 0329   CALCIUM 7.8 (L) 03/26/2023 0338   CALCIUM 7.6 (L) 03/25/2023 0329   AST 33 03/26/2023 0338   AST 48 (H) 03/23/2023 0335   AST 15 07/26/2017 1127   ALT 23 03/26/2023 0338   ALT 29 03/23/2023 0335   ALT 14 07/26/2017 1127   ALKPHOS 177 (H) 03/26/2023 0338   ALKPHOS 147 (H) 03/23/2023 0335   BILITOT 0.7 03/26/2023 0338   BILITOT 0.9 03/23/2023 0335   BILITOT 0.5 07/26/2017 1127   PROT 5.2 (L) 03/26/2023 0338   PROT 5.6 (  L) 03/23/2023 0335   ALBUMIN 1.9 (L) 03/26/2023 0338   ALBUMIN 2.4 (L) 03/23/2023 0335    Studies/Results: DG UGI W SINGLE CM (SOL OR THIN BA)  Result Date: 03/25/2023 CLINICAL DATA:  [id number] Perforated ulcer (HCC) 213086. Postoperative. Rule out leak. EXAM: WATER SOLUBLE UPPER GI SERIES TECHNIQUE: Single-column upper GI series was performed using water soluble contrast. Radiation Exposure Index (as provided by the fluoroscopic device): 289.6 mGy Kerma CONTRAST:  100 mL of Omnipaque 300. COMPARISON:  None Available. FLUOROSCOPY: Fluoroscopy Time:  3 minutes FINDINGS: Scout: No free air under the domes of diaphragm. Left lung base and left lateral costophrenic angle appear clear. Patient is status  post repair of perforated marginal ulcer. No evidence of leak. There is normal passage of barium from the esophagus into the proximal stomach and from the stomach into the jejunum via gastrojejunostomy anastomosis. No abnormal holdup. No large ulcer seen. The excluded stomach was not opacified, as expected. There is also smooth passage of contrast into the distal jejunum and via jejunojejunostomy. No abnormal holdup. No large ulcer seen. IMPRESSION: *No evidence of leak.  Please see above for details. Electronically Signed   By: Jules Schick M.D.   On: 03/25/2023 11:00      Almond Lint 03/26/2023

## 2023-03-27 DIAGNOSIS — D649 Anemia, unspecified: Secondary | ICD-10-CM | POA: Diagnosis not present

## 2023-03-27 DIAGNOSIS — N179 Acute kidney failure, unspecified: Secondary | ICD-10-CM | POA: Diagnosis not present

## 2023-03-27 DIAGNOSIS — F419 Anxiety disorder, unspecified: Secondary | ICD-10-CM | POA: Diagnosis not present

## 2023-03-27 DIAGNOSIS — K631 Perforation of intestine (nontraumatic): Secondary | ICD-10-CM | POA: Diagnosis not present

## 2023-03-27 LAB — CULTURE, BLOOD (ROUTINE X 2)
Culture: NO GROWTH
Culture: NO GROWTH

## 2023-03-27 LAB — CBC
HCT: 25.8 % — ABNORMAL LOW (ref 36.0–46.0)
Hemoglobin: 8 g/dL — ABNORMAL LOW (ref 12.0–15.0)
MCH: 29 pg (ref 26.0–34.0)
MCHC: 31 g/dL (ref 30.0–36.0)
MCV: 93.5 fL (ref 80.0–100.0)
Platelets: 247 10*3/uL (ref 150–400)
RBC: 2.76 MIL/uL — ABNORMAL LOW (ref 3.87–5.11)
RDW: 17.1 % — ABNORMAL HIGH (ref 11.5–15.5)
WBC: 10.4 10*3/uL (ref 4.0–10.5)
nRBC: 0 % (ref 0.0–0.2)

## 2023-03-27 LAB — COMPREHENSIVE METABOLIC PANEL
ALT: 22 U/L (ref 0–44)
AST: 27 U/L (ref 15–41)
Albumin: 1.7 g/dL — ABNORMAL LOW (ref 3.5–5.0)
Alkaline Phosphatase: 164 U/L — ABNORMAL HIGH (ref 38–126)
Anion gap: 7 (ref 5–15)
BUN: 17 mg/dL (ref 8–23)
CO2: 21 mmol/L — ABNORMAL LOW (ref 22–32)
Calcium: 7.4 mg/dL — ABNORMAL LOW (ref 8.9–10.3)
Chloride: 110 mmol/L (ref 98–111)
Creatinine, Ser: 1.01 mg/dL — ABNORMAL HIGH (ref 0.44–1.00)
GFR, Estimated: 60 mL/min — ABNORMAL LOW (ref >60)
Glucose, Bld: 86 mg/dL (ref 70–99)
Potassium: 3 mmol/L — ABNORMAL LOW (ref 3.5–5.1)
Sodium: 138 mmol/L (ref 135–145)
Total Bilirubin: 0.7 mg/dL (ref <1.2)
Total Protein: 4.7 g/dL — ABNORMAL LOW (ref 6.5–8.1)

## 2023-03-27 LAB — GLUCOSE, CAPILLARY
Glucose-Capillary: 72 mg/dL (ref 70–99)
Glucose-Capillary: 75 mg/dL (ref 70–99)
Glucose-Capillary: 76 mg/dL (ref 70–99)
Glucose-Capillary: 79 mg/dL (ref 70–99)
Glucose-Capillary: 72 mg/dL (ref 70–99)
Glucose-Capillary: 82 mg/dL (ref 70–99)
Glucose-Capillary: 76 mg/dL (ref 70–99)

## 2023-03-27 LAB — MAGNESIUM: Magnesium: 2 mg/dL (ref 1.7–2.4)

## 2023-03-27 LAB — PHOSPHORUS: Phosphorus: 2.6 mg/dL (ref 2.5–4.6)

## 2023-03-27 MED ORDER — FOLIC ACID 1 MG PO TABS
1.0000 mg | ORAL_TABLET | Freq: Every day | ORAL | Status: DC
  Administered 2023-03-27 – 2023-04-01 (×5): 1 mg via ORAL
  Filled 2023-03-27 (×6): qty 1

## 2023-03-27 MED ORDER — POTASSIUM CHLORIDE 10 MEQ/100ML IV SOLN
10.0000 meq | INTRAVENOUS | Status: AC
  Administered 2023-03-27 (×5): 10 meq via INTRAVENOUS
  Filled 2023-03-27 (×5): qty 100

## 2023-03-27 MED ORDER — VITAMIN C 500 MG PO TABS
500.0000 mg | ORAL_TABLET | Freq: Every day | ORAL | Status: DC
  Administered 2023-03-27 – 2023-04-01 (×5): 500 mg via ORAL
  Filled 2023-03-27 (×6): qty 1

## 2023-03-27 MED ORDER — GABAPENTIN 100 MG PO CAPS
100.0000 mg | ORAL_CAPSULE | Freq: Three times a day (TID) | ORAL | Status: DC
  Administered 2023-03-27 – 2023-04-01 (×17): 100 mg via ORAL
  Filled 2023-03-27 (×17): qty 1

## 2023-03-27 MED ORDER — POTASSIUM CHLORIDE CRYS ER 20 MEQ PO TBCR: 30.0000 meq | EXTENDED_RELEASE_TABLET | Freq: Four times a day (QID) | ORAL | Status: DC

## 2023-03-27 MED ORDER — VITAMIN D 25 MCG (1000 UNIT) PO TABS
5000.0000 [IU] | ORAL_TABLET | Freq: Every day | ORAL | Status: DC
  Administered 2023-03-27 – 2023-04-01 (×5): 5000 [IU] via ORAL
  Filled 2023-03-27 (×6): qty 5

## 2023-03-27 MED ORDER — ZINC GLUCONATE 50 MG PO TABS: 50.0000 mg | ORAL_TABLET | Freq: Every day | ORAL | Status: DC

## 2023-03-27 MED ORDER — ZINC SULFATE 220 (50 ZN) MG PO CAPS
220.0000 mg | ORAL_CAPSULE | Freq: Every day | ORAL | Status: DC
  Administered 2023-03-27 – 2023-04-01 (×6): 220 mg via ORAL
  Filled 2023-03-27 (×6): qty 1

## 2023-03-27 MED ORDER — TRAMADOL HCL 50 MG PO TABS: 50.0000 mg | ORAL_TABLET | Freq: Three times a day (TID) | ORAL | Status: DC | PRN

## 2023-03-27 MED ORDER — ADULT MULTIVITAMIN W/MINERALS CH
1.0000 | ORAL_TABLET | Freq: Every day | ORAL | Status: DC
  Administered 2023-03-27 – 2023-04-01 (×5): 1 via ORAL
  Filled 2023-03-27 (×6): qty 1

## 2023-03-27 MED ORDER — PROMETHAZINE HCL 25 MG PO TABS: 25.0000 mg | ORAL_TABLET | Freq: Four times a day (QID) | ORAL | Status: DC | PRN

## 2023-03-27 MED ORDER — POTASSIUM CHLORIDE 20 MEQ PO PACK
40.0000 meq | PACK | Freq: Once | ORAL | Status: AC
  Administered 2023-03-27: 40 meq via ORAL
  Filled 2023-03-27: qty 2

## 2023-03-27 NOTE — Plan of Care (Signed)
  Problem: Education: Goal: Knowledge of General Education information will improve Description: Including pain rating scale, medication(s)/side effects and non-pharmacologic comfort measures Outcome: Progressing   Problem: Clinical Measurements: Goal: Respiratory complications will improve Outcome: Progressing Goal: Cardiovascular complication will be avoided Outcome: Progressing   Problem: Nutrition: Goal: Adequate nutrition will be maintained Outcome: Progressing   Problem: Elimination: Goal: Will not experience complications related to bowel motility Outcome: Progressing Goal: Will not experience complications related to urinary retention Outcome: Progressing   Problem: Pain Management: Goal: General experience of comfort will improve Outcome: Progressing   Problem: Safety: Goal: Ability to remain free from injury will improve Outcome: Progressing

## 2023-03-27 NOTE — Progress Notes (Addendum)
PROGRESS NOTE    Christine [name]  ZDG:[id number] DOB: 07-Sep-1950 DOA: 03/21/2023 PCP: Pcp, No    Brief Narrative:   Patient is a 72 year old female history of hypertension, hyperlipidemia, OSA, GERD, morbid obesity, 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  Patient found to have intra-abdominal free air and fluid and concern for possible GJ anastomosis ulceration resulting in perforation.  Patient also noted to have significant abdominal pain and vomiting for 3 days prior to admission.  Patient was seen by general surgery and underwent  exploratory laparotomy, abdominal washout, Graham patch repair of perforated gastrojejunal ulcer, intraperitoneal drain placement.  Patient subsequently admitted to the stepdown unit for closer monitoring since hypotensive and also in acute renal failure.   Assessment & Plan:   Principal Problem:   Bowel perforation (HCC) Active Problems:   Normocytic anemia   GAD (generalized anxiety disorder)   Anxiety   Hyperlipidemia   Hypernatremia   ARF (acute renal failure) (HCC)   Hypotension   Metabolic acidosis   Perforated marginal ulcer Likely secondary to G-tube displacement versus GJ anastomosis ulceration.  Patient underwent  expiratory laparotomy, abdominal washout, Graham patch repair of perforated gastrojejunal ulcer, intraperitoneal drain placement on 03/21/2023.  Seen by general surgery today and has been started on full liquids and okay with p.o. meds.  Continue, IV fluids, IV Zosyn PPI.   Further plans as per general surgery. Has mild leukocytosis with a temperature max of 98.  General surgery following.  Metabolic encephalopathy.  Resolved.  Likely multifactorial from  surgery, IV narcotics and renal failure and hypoglycemia.   Will decrease D10 when oral intake is adequate.  Has been started on full case.  Hypoglycemia.  Now  improved with D10.  Started on Eliquis today.  Latest glucose of 75.  Will continue.   Hypotension Resolved at this time.  Acute renal failure with metabolic acidosis Creatinine today at 1.0 and at baseline.  Significantly improved.  On IV fluids.  Creatinine up to 2.04 on 03/22/2023 continue to monitor intake and output charting.  Will give 1 dose of IV Lasix today due to lower extremity edema and pain.  Hypokalemia.  Replenished with IV potassium, replenished with 60 mEq yesterday and will replenish with 50 mEq today.  Potassium of 3.0 today.  Probable UTI On IV Zosyn.  Urine culture with 60,000 colonies of E. coli resistant to multiple antibiotics except Zosyn.  Currently on Zosyn will continue.   New ST depression On admission.  No previous EKG. Admitting physician discussed with cardiology and recommended monitoring.  Patient did have tachycardia.  2D echocardiogram with LV function of 50 to 55% with no regional wall motion abnormality. May need outpatient follow-up with cardiology.  GERD Continue PPI.  Hypernatremia Improved at this time.  Improved.  Latest sodium of 138  Hypomagnesemia.  Improved after replacement.  Latest magnesium of 2.0   Anemia Ferritin of 229, serum iron low at 11 TIBC low, folic acid normal at 19, vitamin B12 elevated at 1005. Continue to monitor CBC.  Transfuse for hemoglobin less than 8.  Bilateral medial aspect of leg tenderness.  Has a chronic venous stasis.  Likely from fluid retention.  Will consider IV Lasix x 1 when potassium better.    Debility, deconditioning.  Will get PT OT evaluation.  Might need rehabilitation placement.  DVT prophylaxis: Lovenox subcu.  Code Status: DNR  Family Communication:  I  again spoke with  the patient's sister [name] at bedside on 03/27/2023  Disposition: Skilled nursing facility as per PT recommendation when okay with general surgery.  Status is: Inpatient Remains inpatient appropriate because: Severity  of illness, pending clinical improvement,   Consultants:  IR General Surgery: Dr.Gerkin 03/21/2023  Procedures:   Gastrostomy tube injection manipulation with fluoroscopy per IR: Dr. Lowella Dandy 03/21/2023  Exploratory laparotomy, abdominal washout, [name] patch repair of perforated gastrojejunal ulcer, intraperitoneal drain placement per general surgery: Dr. Freida Busman 03/21/2023  Antimicrobials:  Zosyn IV  Subjective  Today, patient was seen and examined at bedside.  Patient feels better.  Has had bowel movements.  Denies any nausea vomiting or abdominal pain.  Feels that the legs have swollen up with some focal tenderness.  General surgery has recommended bariatric full liquids today.   Objective: Vitals:   03/27/23 0800 03/27/23 0810 03/27/23 0838 03/27/23 1022  BP: (!) 155/74   (!) 191/102  Pulse: 90 89    Resp: 15 15    Temp:   98.2 F (36.8 C)   TempSrc:   Oral   SpO2: 98% 100%    Weight:      Height:        Intake/Output Summary (Last 24 hours) at 03/27/2023 1148 Last data filed at 03/27/2023 1119 Gross per 24 hour  Intake 2384.64 ml  Output 905 ml  Net 1479.64 ml   Filed Weights   03/23/23 1024  Weight: 105.6 kg    Physical examination:  Body mass index is 43.99 kg/m.   General: Obese built, not in obvious distress, on room air, alert awake and oriented.   HENT:   No scleral pallor or icterus noted. Oral mucosa is moist.  Chest:    Diminished breath sounds bilaterally. No crackles or wheezes.  CVS: S1 &S2 heard. No murmur.  Regular rate and rhythm.  Abdomen: Diffuse tenderness on palpation, dressing in place with right lower quadrant JP drain.  Marland Kitchen  Hypoactive bowel sounds.  Urethral catheter in place. Extremities: No cyanosis, clubbing or edema.   Psych: Appears to be alert awake and oriented CNS: Moves all extremities, nonfocal Skin: Warm and dry.  Surgical dressing in place in the abdomen with drain.   Data Reviewed: I have personally reviewed following labs  and imaging studies  CBC: Recent Labs  Lab 03/21/23 0715 03/22/23 0316 03/23/23 0335 03/23/23 1950 03/25/23 0329 03/26/23 0338 03/27/23 0242  WBC 6.9   < > 16.1* 19.4* 13.2* 11.0* 10.4  NEUTROABS 5.5  --  13.4*  --   --   --   --   HGB 11.1*   < > 10.3* 11.0* 9.7* 9.7* 8.0*  HCT 36.0   < > 34.4* 34.4* 30.1* 31.8* 25.8*  MCV 96.0   < > 98.6 94.5 92.9 96.4 93.5  PLT 312   < > 249 192 259 228 247   < > = values in this interval not displayed.    Basic Metabolic Panel: Recent Labs  Lab 03/23/23 0335 03/23/23 1950 03/25/23 0329 03/26/23 0338 03/27/23 0242  NA 147* 143 141 143 138  K 3.3* 4.1 2.9* 3.1* 3.0*  CL 113* 112* 109 111 110  CO2 22 21* 23 23 21*  GLUCOSE 78 121* 93 81 86  BUN 36* 35* 32* 26* 17  CREATININE 2.11* 1.85* 1.43* 1.23* 1.01*  CALCIUM 7.8* 7.4* 7.6* 7.8* 7.4*  MG 1.3*  --  1.4* 2.4 2.0  PHOS 3.0  --   --  3.1 2.6    GFR: Estimated Creatinine Clearance: 58 mL/min (A) (by C-G formula based on SCr of 1.01 mg/dL (H)).  Liver Function Tests: Recent Labs  Lab 03/21/23 0715 03/23/23 0335 03/26/23 0338 03/27/23 0242  AST 33 48* 33 27  ALT 20 29 23 22   ALKPHOS 108 147* 177* 164*  BILITOT 0.8 0.9 0.7 0.7  PROT 6.9 5.6* 5.2* 4.7*  ALBUMIN 2.7* 2.4* 1.9* 1.7*    CBG: Recent Labs  Lab 03/26/23 1802 03/26/23 1932 03/26/23 2342 03/27/23 0411 03/27/23 0808  GLUCAP 81 93 84 72 75     Recent Results (from the past 240 hour(s))  MRSA Next Gen by PCR, Nasal     Status: None   Collection Time: 03/21/23  8:21 PM   Specimen: Nasal Mucosa; Nasal Swab  Result Value Ref Range Status   MRSA by PCR Next Gen NOT DETECTED NOT DETECTED Final    Comment: (NOTE) The GeneXpert MRSA Assay (FDA approved for NASAL specimens only), is one component of a comprehensive MRSA colonization surveillance program. It is not intended to diagnose MRSA infection nor to guide or monitor treatment for MRSA infections. Test performance is not FDA approved in patients less  than 38 years old. Performed at [facility], [address], [place], [place] [address]   Urine Culture (for pregnant, neutropenic or urologic patients or patients with an indwelling urinary catheter)     Status: Abnormal   Collection Time: 03/22/23 10:26 AM   Specimen: Urine, Catheterized  Result Value Ref Range Status   Specimen Description   Final    URINE, CATHETERIZED Performed at [facility], [address] [place], [place] [address]    Special Requests   Final    NONE Performed at [facility], [address] [place], [place] [address]    Culture (A)  Final    60,000 COLONIES/mL ESCHERICHIA COLI Confirmed Extended Spectrum Beta-Lactamase Producer (ESBL).  In bloodstream infections from ESBL organisms, carbapenems are preferred over piperacillin/tazobactam. They are shown to have a lower risk of mortality.    Report Status 03/25/2023 FINAL  Final   Organism ID, Bacteria ESCHERICHIA COLI (A)  Final      Susceptibility   Escherichia coli - MIC*    AMPICILLIN >=32 RESISTANT Resistant     CEFAZOLIN >=64 RESISTANT Resistant     CEFEPIME >=32 RESISTANT Resistant     CEFTRIAXONE >=64 RESISTANT Resistant     CIPROFLOXACIN >=4 RESISTANT Resistant     GENTAMICIN >=16 RESISTANT Resistant     IMIPENEM <=0.25 SENSITIVE Sensitive     NITROFURANTOIN <=16 SENSITIVE Sensitive     TRIMETH/SULFA >=320 RESISTANT Resistant     AMPICILLIN/SULBACTAM 16 INTERMEDIATE Intermediate     PIP/TAZO <=4 SENSITIVE Sensitive ug/mL    * 60,000 COLONIES/mL ESCHERICHIA COLI  Culture, blood (Routine X 2) w Reflex to ID Panel     Status: None   Collection Time: 03/22/23  9:16 PM   Specimen: BLOOD LEFT ARM  Result Value Ref Range Status   Specimen Description   Final    BLOOD LEFT ARM Performed at [facility], [address], [place], [place] [address]    Special Requests   Final    BOTTLES DRAWN AEROBIC ONLY Blood Culture  results may not be optimal due to an inadequate volume of blood received in culture bottles Performed at [facility], [address], [place], [place] [address]    Culture  Final    NO GROWTH 5 DAYS Performed at [facility], [address], [place], [place] [address]    Report Status 03/27/2023 FINAL  Final  Culture, blood (Routine X 2) w Reflex to ID Panel     Status: None   Collection Time: 03/22/23  9:16 PM   Specimen: BLOOD RIGHT HAND  Result Value Ref Range Status   Specimen Description   Final    BLOOD RIGHT HAND Performed at [facility], [address], [place], [place] [address]    Special Requests   Final    BOTTLES DRAWN AEROBIC ONLY Blood Culture results may not be optimal due to an inadequate volume of blood received in culture bottles Performed at [facility], [address], [place], [place] [address]    Culture   Final    NO GROWTH 5 DAYS Performed at [facility], [address] [place], [place] [address]    Report Status 03/27/2023 FINAL  Final       Radiology Studies: No results found.    Scheduled Meds:  Chlorhexidine Gluconate Cloth  6 each Topical Daily   enoxaparin (LOVENOX) injection  40 mg Subcutaneous Q24H   mouth rinse  15 mL Mouth Rinse 4 times per day   pantoprazole (PROTONIX) IV  40 mg Intravenous Q12H   Continuous Infusions:  dextrose 50 mL/hr at 03/27/23 1119   piperacillin-tazobactam (ZOSYN)  IV Stopped (03/27/23 1055)   potassium chloride 100 mL/hr at 03/27/23 1119     LOS: 6 days     Joycelyn Das, MD [facility] 03/27/2023, 11:48 AM

## 2023-03-27 NOTE — Plan of Care (Signed)
  Problem: Clinical Measurements: Goal: Ability to maintain clinical measurements within normal limits will improve Outcome: Not Progressing Note: Potassium still low (3.0).  Gave patient 5 runs of IV potassium. Goal: Respiratory complications will improve Outcome: Progressing   Problem: Coping: Goal: Level of anxiety will decrease Outcome: Progressing   Problem: Elimination: Goal: Will not experience complications related to bowel motility Outcome: Progressing

## 2023-03-27 NOTE — Progress Notes (Signed)
Assessment & Plan: Perforated marginal ulcer - POD#6 - status post ex lap, abdominal washout, Graham patch repair of perforated gastrojejunal ulcer, drain placement - 03/21/2023 Dr. Freida Busman - Hypokalemia per primary team - advance to bari fulls.  - Leukocytosis resolved. Zosyn. - PPI to continue iv for now.  - monitor drain output- looks good at this point  - AKI resolving.  - OOB, mobilize - appreciate TRH management  Advancing diet. Ok to start oral meds if desired.     Pt/OT consults.          Maudry Diego, MD, FACS, FSSO Surgical Oncology, General Surgery, Trauma and Critical [facility], [place] [phone] for weekday/non holidays Check [email or web address] for general surgery coverage night/weekend/holidays           Chief Complaint: Perforated marginal ulcer  Subjective: No acute events other than continued hypokalemia with need to replace.   Objective: Vital signs in last 24 hours: Temp:  [97.4 F (36.3 C)-98.4 F (36.9 C)] 98.2 F (36.8 C) (12/01 0838) Pulse Rate:  [83-116] 88 (12/01 0740) Resp:  [1-26] 19 (12/01 0740) BP: (117-171)/(56-135) 130/80 (12/01 0700) SpO2:  [93 %-100 %] 96 % (12/01 0740) Last BM Date : 03/26/23  Intake/Output from previous day: 11/30 0701 - 12/01 0700 In: 1748.6 [I.V.:1056.8; IV Piggyback:691.8] Out: 385 [Urine:350; Drains:35] Intake/Output this shift: Total I/O In: 857.1 [I.V.:772.5; IV Piggyback:84.6] Out: 520 [Urine:475; Drains:45]  Physical Exam: General:  A&O x 3, NAD.  Abdomen - soft, midline wound intact with wet to dry dressing in place; JP with serosanguinous output - small; gastrostomy plugged LUQ. Healing midline wound.   Lab Results:  Recent Labs    03/26/23 0338 03/27/23 0242  WBC 11.0* 10.4  HGB 9.7* 8.0*  HCT 31.8* 25.8*  PLT 228 247   BMET Recent Labs    03/26/23 0338 03/27/23 0242  NA 143 138  K 3.1* 3.0*  CL 111 110  CO2 23 21*  GLUCOSE 81 86  BUN 26* 17  CREATININE 1.23*  1.01*  CALCIUM 7.8* 7.4*   PT/INR No results for input(s): "LABPROT", "INR" in the last 72 hours. Comprehensive Metabolic Panel:    Component Value Date/Time   NA 138 03/27/2023 0242   NA 143 03/26/2023 0338   K 3.0 (L) 03/27/2023 0242   K 3.1 (L) 03/26/2023 0338   CL 110 03/27/2023 0242   CL 111 03/26/2023 0338   CO2 21 (L) 03/27/2023 0242   CO2 23 03/26/2023 0338   BUN 17 03/27/2023 0242   BUN 26 (H) 03/26/2023 0338   CREATININE 1.01 (H) 03/27/2023 0242   CREATININE 1.23 (H) 03/26/2023 0338   CREATININE 0.91 07/26/2017 1127   GLUCOSE 86 03/27/2023 0242   GLUCOSE 81 03/26/2023 0338   CALCIUM 7.4 (L) 03/27/2023 0242   CALCIUM 7.8 (L) 03/26/2023 0338   AST 27 03/27/2023 0242   AST 33 03/26/2023 0338   AST 15 07/26/2017 1127   ALT 22 03/27/2023 0242   ALT 23 03/26/2023 0338   ALT 14 07/26/2017 1127   ALKPHOS 164 (H) 03/27/2023 0242   ALKPHOS 177 (H) 03/26/2023 0338   BILITOT 0.7 03/27/2023 0242   BILITOT 0.7 03/26/2023 0338   BILITOT 0.5 07/26/2017 1127   PROT 4.7 (L) 03/27/2023 0242   PROT 5.2 (L) 03/26/2023 0338   ALBUMIN 1.7 (L) 03/27/2023 0242   ALBUMIN 1.9 (L) 03/26/2023 0338    Studies/Results: DG UGI W SINGLE CM (SOL OR THIN BA)  Result Date:  03/25/2023 CLINICAL DATA:  [id number] Perforated ulcer (HCC) [id number]. Postoperative. Rule out leak. EXAM: WATER SOLUBLE UPPER GI SERIES TECHNIQUE: Single-column upper GI series was performed using water soluble contrast. Radiation Exposure Index (as provided by the fluoroscopic device): 289.6 mGy Kerma CONTRAST:  100 mL of Omnipaque 300. COMPARISON:  None Available. FLUOROSCOPY: Fluoroscopy Time:  3 minutes FINDINGS: Scout: No free air under the domes of diaphragm. Left lung base and left lateral costophrenic angle appear clear. Patient is status post repair of perforated marginal ulcer. No evidence of leak. There is normal passage of barium from the esophagus into the proximal stomach and from the stomach into the jejunum via  gastrojejunostomy anastomosis. No abnormal holdup. No large ulcer seen. The excluded stomach was not opacified, as expected. There is also smooth passage of contrast into the distal jejunum and via jejunojejunostomy. No abnormal holdup. No large ulcer seen. IMPRESSION: *No evidence of leak.  Please see above for details. Electronically Signed   By: Jules Schick M.D.   On: 03/25/2023 11:00      Almond Lint 03/27/2023

## 2023-03-28 DIAGNOSIS — D649 Anemia, unspecified: Secondary | ICD-10-CM | POA: Diagnosis not present

## 2023-03-28 DIAGNOSIS — K631 Perforation of intestine (nontraumatic): Secondary | ICD-10-CM | POA: Diagnosis not present

## 2023-03-28 DIAGNOSIS — N179 Acute kidney failure, unspecified: Secondary | ICD-10-CM | POA: Diagnosis not present

## 2023-03-28 DIAGNOSIS — F419 Anxiety disorder, unspecified: Secondary | ICD-10-CM | POA: Diagnosis not present

## 2023-03-28 LAB — BASIC METABOLIC PANEL
Anion gap: 8 (ref 5–15)
BUN: 13 mg/dL (ref 8–23)
CO2: 19 mmol/L — ABNORMAL LOW (ref 22–32)
Calcium: 7.6 mg/dL — ABNORMAL LOW (ref 8.9–10.3)
Chloride: 107 mmol/L (ref 98–111)
Creatinine, Ser: 0.92 mg/dL (ref 0.44–1.00)
GFR, Estimated: >60 mL/min (ref >60)
Glucose, Bld: 81 mg/dL (ref 70–99)
Potassium: 3.5 mmol/L (ref 3.5–5.1)
Sodium: 134 mmol/L — ABNORMAL LOW (ref 135–145)

## 2023-03-28 LAB — CBC
HCT: 26.1 % — ABNORMAL LOW (ref 36.0–46.0)
Hemoglobin: 8.3 g/dL — ABNORMAL LOW (ref 12.0–15.0)
MCH: 29.5 pg (ref 26.0–34.0)
MCHC: 31.8 g/dL (ref 30.0–36.0)
MCV: 92.9 fL (ref 80.0–100.0)
Platelets: 251 10*3/uL (ref 150–400)
RBC: 2.81 MIL/uL — ABNORMAL LOW (ref 3.87–5.11)
RDW: 17.2 % — ABNORMAL HIGH (ref 11.5–15.5)
WBC: 11 10*3/uL — ABNORMAL HIGH (ref 4.0–10.5)
nRBC: 0 % (ref 0.0–0.2)

## 2023-03-28 LAB — GLUCOSE, CAPILLARY
Glucose-Capillary: 74 mg/dL (ref 70–99)
Glucose-Capillary: 77 mg/dL (ref 70–99)
Glucose-Capillary: 82 mg/dL (ref 70–99)
Glucose-Capillary: 89 mg/dL (ref 70–99)
Glucose-Capillary: 78 mg/dL (ref 70–99)

## 2023-03-28 LAB — MAGNESIUM: Magnesium: 2 mg/dL (ref 1.7–2.4)

## 2023-03-28 MED ORDER — ACETAMINOPHEN 325 MG PO TABS
650.0000 mg | ORAL_TABLET | Freq: Four times a day (QID) | ORAL | Status: DC
  Administered 2023-03-28 – 2023-04-01 (×14): 650 mg via ORAL
  Filled 2023-03-28 (×16): qty 2

## 2023-03-28 MED ORDER — PANTOPRAZOLE SODIUM 40 MG PO TBEC
40.0000 mg | DELAYED_RELEASE_TABLET | Freq: Two times a day (BID) | ORAL | Status: DC
  Administered 2023-03-28 – 2023-03-29 (×2): 40 mg via ORAL
  Filled 2023-03-28 (×2): qty 1

## 2023-03-28 NOTE — Evaluation (Signed)
Occupational Therapy Evaluation Patient Details Name: Christine [name] MRN: [id number] DOB: [birth year] Today's Date: 03/28/2023   History of Present Illness Patient is a 72 year old female admitted 03/21/23 with abdominal pain and vomiting. S/p ex lap, abdominal washout, Graham patch repair of perforated gastrojejunal ulcer, drain placement 03/21/2023. PMH includes HTN, hyperlipidemia, OSA, GERD, morbid obesity, esophageal dysmotility with gastric bypass May 2024 followed by failure to thrive with G-tube placement on 01/2023.   Clinical Impression   Pt has been at [facility] since Sept, and reports that she does her own [facility] transfers, gets assist for bathing/dressing (not as often as she would like) and getting minimal therapy. Today she is mod A +2 for bed mobility (utilized rolling and HOB highly elevated for pain management) min A +2 for stand pivot transfer to recliner with RW. She is max A for LB ADL, mod to set up for UB ADL (see full ADL section below) and will benefit from skilled OT in the acute setting as well as afterwards at the <3 hour therapy rehab setting to maximize safety and independence in ADL and functional transfers. Next session plan on education of AE for LB ADL and OOB activity.        If plan is discharge home, recommend the following: A lot of help with walking and/or transfers;A lot of help with bathing/dressing/bathroom;Assistance with cooking/housework;Assist for transportation;Help with stairs or ramp for entrance    Functional Status Assessment  Patient has had a recent decline in their functional status and demonstrates the ability to make significant improvements in function in a reasonable and predictable amount of time.  Equipment Recommendations  None recommended by OT (defer to next venue of care)    Recommendations for Other Services PT consult     Precautions / Restrictions Precautions Precautions: Fall Precaution Comments: abdominal sx, JP drain  R, Restrictions Weight Bearing Restrictions: No      Mobility Bed Mobility Overal bed mobility: Needs Assistance Bed Mobility: Rolling, Sidelying to Sit Rolling: Mod assist, +2 for physical assistance, +2 for safety/equipment, Used rails Sidelying to sit: Mod assist, +2 for physical assistance, +2 for safety/equipment, HOB elevated, Used rails (head highly elevated, use of bed pad to assist with hips)       General bed mobility comments: rolling for comfort, vc for sequencing, use of rails and bed pad to facilitate, ultimately mod A +2    Transfers Overall transfer level: Needs assistance Equipment used: Rolling walker (2 wheels) Transfers: Sit to/from Stand, Bed to chair/wheelchair/BSC Sit to Stand: Min assist, +2 physical assistance, +2 safety/equipment     Step pivot transfers: Min assist, +2 physical assistance, +2 safety/equipment     General transfer comment: rocking momentum used, assist for lines and assist with manipulating RW in tight space      Balance Overall balance assessment: Needs assistance Sitting-balance support: Feet supported Sitting balance-Leahy Scale: Good Sitting balance - Comments: initially dizzy, improved as she sat EOB   Standing balance support: Bilateral upper extremity supported, Reliant on assistive device for balance Standing balance-Leahy Scale: Poor                             ADL either performed or assessed with clinical judgement   ADL Overall ADL's : Needs assistance/impaired Eating/Feeding: Set up;Sitting   Grooming: Set up;Sitting Grooming Details (indicate cue type and reason): supported in recliner Upper Body Bathing: Moderate assistance   Lower Body Bathing: Maximal assistance   Upper  Body Dressing : Moderate assistance   Lower Body Dressing: Maximal assistance   Toilet Transfer: Minimal assistance;+2 for physical assistance;+2 for safety/equipment;Stand-pivot;Rolling walker (2 wheels)   Toileting-  Clothing Manipulation and Hygiene: Maximal assistance;+2 for safety/equipment;Sit to/from stand       Functional mobility during ADLs: Minimal assistance;+2 for physical assistance;+2 for safety/equipment;Rolling walker (2 wheels) (SPT only) General ADL Comments: deconditioned, generalized weakness, decreased access to LB for ADL due to new abdominal sx/pain     Vision Baseline Vision/History: 1 Wears glasses Ability to See in Adequate Light: 0 Adequate Patient Visual Report: No change from baseline Vision Assessment?: No apparent visual deficits     Perception         Praxis         Pertinent Vitals/Pain Pain Assessment Pain Assessment: Faces Faces Pain Scale: Hurts little more Pain Location: abdominal incision Pain Descriptors / Indicators: Sore Pain Intervention(s): Limited activity within patient's tolerance, Monitored during session, Repositioned     Extremity/Trunk Assessment Upper Extremity Assessment Upper Extremity Assessment: Generalized weakness   Lower Extremity Assessment Lower Extremity Assessment: Defer to PT evaluation   Cervical / Trunk Assessment Cervical / Trunk Assessment: Other exceptions Cervical / Trunk Exceptions: s/p gastric bypass sx - morbidly obese   Communication Communication Communication: No apparent difficulties Cueing Techniques: Verbal cues;Tactile cues   Cognition Arousal: Alert Behavior During Therapy: WFL for tasks assessed/performed Overall Cognitive Status: Within Functional Limits for tasks assessed                                 General Comments: oriented x4, sometimes needs to be redirected but overall very cooperative, pleasant and fun     General Comments  VSS throughout session, Sister [name] present and supportive. Pt is motivated    Exercises     Shoulder Instructions      Home Living Family/patient expects to be discharged to:: Skilled nursing facility                                  Additional Comments: had been in [place] but most recently [facility] - wants other options. Ultimate goal would be to move back to [facility] [place] closer to family      Prior Functioning/Environment Prior Level of Function : Needs assist;History of Falls (last six months)             Mobility Comments: has had 3 falls (one needing 17 staples in head) while at SNF. has been getting herself to the [facility], and small bouts of walking with RW ADLs Comments: assist at bed level since Sept admission, but does SPT to [facility]        OT Problem List: Decreased range of motion;Decreased activity tolerance;Impaired balance (sitting and/or standing);Decreased knowledge of use of DME or AE;Obesity;Increased edema      OT Treatment/Interventions: Self-care/ADL training;Energy conservation;DME and/or AE instruction;Therapeutic activities;Patient/family education;Balance training    OT Goals(Current goals can be found in the care plan section) Acute Rehab OT Goals Patient Stated Goal: get stronger to be able to move back to [place] OT Goal Formulation: With patient/family Time For Goal Achievement: 04/11/23 Potential to Achieve Goals: Good ADL Goals Pt Will Perform Grooming: with modified independence;sitting Pt Will Perform Upper Body Dressing: with set-up;sitting Pt Will Perform Lower Body Dressing: with min assist;sit to/from stand;with adaptive equipment Pt Will Transfer to  Toilet: with supervision;ambulating Pt Will Perform Toileting - Clothing Manipulation and hygiene: with contact guard assist;with adaptive equipment;sit to/from stand Additional ADL Goal #1: Pt will perform bed mobilty as precursor to participating in ADL at supervision level  OT Frequency: Min 1X/week    Co-evaluation PT/OT/SLP Co-Evaluation/Treatment: Yes Reason for Co-Treatment: For patient/therapist safety;To address functional/ADL transfers PT goals addressed during session: Balance;Mobility/safety with  mobility;Proper use of DME;Strengthening/ROM OT goals addressed during session: ADL's and self-care;Proper use of Adaptive equipment and DME;Strengthening/ROM      AM-PAC OT "6 Clicks" Daily Activity     Outcome Measure Help from another person eating meals?: None Help from another person taking care of personal grooming?: A Little Help from another person toileting, which includes using toliet, bedpan, or urinal?: A Lot Help from another person bathing (including washing, rinsing, drying)?: A Lot Help from another person to put on and taking off regular upper body clothing?: A Lot Help from another person to put on and taking off regular lower body clothing?: A Lot 6 Click Score: 15   End of Session Equipment Utilized During Treatment: Rolling walker (2 wheels) Nurse Communication: Mobility status;Precautions  Activity Tolerance: Patient tolerated treatment well Patient left: in chair;with call bell/phone within reach;with chair alarm set;with family/visitor present  OT Visit Diagnosis: Unsteadiness on feet (R26.81);Other abnormalities of gait and mobility (R26.89);Muscle weakness (generalized) (M62.81)                Time: 1324-4010 OT Time Calculation (min): 26 min Charges:  OT General Charges $OT Visit: 1 Visit OT Evaluation $OT Eval Moderate Complexity: 1 Mod  Nyoka Cowden OTR/L Acute Rehabilitation Services Office: [phone]  E[facility] 03/28/2023, 11:03 AM

## 2023-03-28 NOTE — Progress Notes (Signed)
   Progress Note  7 Days Post-Op  Subjective: Pt having loose stools, 2-4 per day. She really wants chocolate milk but denies nausea or vomiting. Some abdominal pain.   Objective: Vital signs in last 24 hours: Temp:  [97.4 F (36.3 C)-98.9 F (37.2 C)] 97.4 F (36.3 C) (12/02 0800) Pulse Rate:  [84-117] 92 (12/02 0700) Resp:  [14-29] 19 (12/02 0700) BP: (118-191)/(52-102) 147/56 (12/02 0700) SpO2:  [97 %-100 %] 98 % (12/02 0700) Last BM Date : 03/27/23  Intake/Output from previous day: 12/01 0701 - 12/02 0700 In: 2932.2 [P.O.:240; I.V.:1948.6; IV Piggyback:743.6] Out: 1090 [Urine:1025; Drains:65] Intake/Output this shift: No intake/output data recorded.  PE: General: pleasant, WD, obese female who is laying in bed in NAD Heart: regular, rate, and rhythm.  Lungs: Respiratory effort nonlabored Abd: soft, appropriately ttp, G tube clamped, JP SS, midline clean, liquid stool present  GU: foley present    Lab Results:  Recent Labs    03/27/23 0242 03/28/23 0252  WBC 10.4 11.0*  HGB 8.0* 8.3*  HCT 25.8* 26.1*  PLT 247 251   BMET Recent Labs    03/27/23 0242 03/28/23 0252  NA 138 134*  K 3.0* 3.5  CL 110 107  CO2 21* 19*  GLUCOSE 86 81  BUN 17 13  CREATININE 1.01* 0.92  CALCIUM 7.4* 7.6*   PT/INR No results for input(s): "LABPROT", "INR" in the last 72 hours. CMP     Component Value Date/Time   NA 134 (L) 03/28/2023 0252   K 3.5 03/28/2023 0252   CL 107 03/28/2023 0252   CO2 19 (L) 03/28/2023 0252   GLUCOSE 81 03/28/2023 0252   BUN 13 03/28/2023 0252   CREATININE 0.92 03/28/2023 0252   CREATININE 0.91 07/26/2017 1127   CALCIUM 7.6 (L) 03/28/2023 0252   PROT 4.7 (L) 03/27/2023 0242   ALBUMIN 1.7 (L) 03/27/2023 0242   AST 27 03/27/2023 0242   AST 15 07/26/2017 1127   ALT 22 03/27/2023 0242   ALT 14 07/26/2017 1127   ALKPHOS 164 (H) 03/27/2023 0242   BILITOT 0.7 03/27/2023 0242   BILITOT 0.5 07/26/2017 1127   GFRNONAA >60 03/28/2023 0252    GFRNONAA >60 07/26/2017 1127   GFRAA >60 02/28/2019 1022   GFRAA >60 07/26/2017 1127   Lipase     Component Value Date/Time   LIPASE 55 (H) 03/21/2023 0715       Studies/Results: No results found.  Anti-infectives: Anti-infectives (From admission, onward)    Start     Dose/Rate Route Frequency Ordered Stop   03/21/23 1330  piperacillin-tazobactam (ZOSYN) IVPB 3.375 g        3.375 g 12.5 mL/hr over 240 Minutes Intravenous Every 8 hours 03/21/23 1325          Assessment/Plan  Perforated marginal ulcer POD#7 - status post ex lap, abdominal washout, Graham patch repair of perforated gastrojejunal ulcer, drain placement  Dr. Freida Busman - advance to bari advanced  - WBC 11 and afebrile - today is day 7 of zosyn, stop today and monitor WBC - BID PPI - send stool for H pylori  - drain with 65 cc out in last 24h - midline clean  - mobilize as able  FEN: bari advanced VTE: LMWH ID: Zosyn 11/25>12/2  LOS: 7 days     Juliet Rude, [facility] 03/28/2023, 10:12 AM Please see Amion for pager number during day hours 7:00am-4:30pm

## 2023-03-28 NOTE — Evaluation (Signed)
Physical Therapy Evaluation Patient Details Name: Christine [name] MRN: [id number] DOB: 1950-07-23 Today's Date: 03/28/2023  History of Present Illness  Patient is a 72 year old female admitted 03/21/23 with abdominal pain and vomiting. S/p ex lap, abdominal washout, Graham patch repair of perforated gastrojejunal ulcer, drain placement 03/21/2023. PMH includes HTN, hyperlipidemia, OSA, GERD, morbid obesity, esophageal dysmotility with gastric bypass May 2024 followed by failure to thrive with G-tube placement on 01/2023.  Clinical Impression  Pt admitted with above diagnosis.  Pt currently with functional limitations due to the deficits listed below (see PT Problem List). Pt will benefit from acute skilled PT to increase their independence and safety with mobility to allow discharge.    The  patient  reports that she has been in rehab facilities  and would like  a new one.  Patient reports transfers independently, requires assist for ambulation.  The patient did stand and step to transfer  from bed to recliner requiring +2 mod support for safety, lines.           If plan is discharge home, recommend the following: A lot of help with bathing/dressing/bathroom;Two people to help with walking and/or transfers;Assistance with cooking/housework;Assist for transportation;Help with stairs or ramp for entrance   Can travel by private vehicle   No    Equipment Recommendations None recommended by PT  Recommendations for Other Services       Functional Status Assessment Patient has had a recent decline in their functional status and demonstrates the ability to make significant improvements in function in a reasonable and predictable amount of time.     Precautions / Restrictions Precautions Precautions: Fall Precaution Comments: abdominal sx, JP drain R, Restrictions Weight Bearing Restrictions: No      Mobility  Bed Mobility Overal bed mobility: Needs Assistance                   Transfers                   General transfer comment: rocking momentum used, assist for lines and assist with manipulating RW in tight space, slow steps to recliner.    Ambulation/Gait                  Stairs            Wheelchair Mobility     Tilt Bed    Modified Rankin (Stroke Patients Only)       Balance Overall balance assessment: Needs assistance Sitting-balance support: Feet supported Sitting balance-Leahy Scale: Good Sitting balance - Comments: initially dizzy, improved as she sat EOB   Standing balance support: Bilateral upper extremity supported, Reliant on assistive device for balance                                 Pertinent Vitals/Pain Pain Assessment Faces Pain Scale: Hurts little more Pain Location: abdominal incision Pain Descriptors / Indicators: Sore Pain Intervention(s): Monitored during session, Limited activity within patient's tolerance    Home Living Family/patient expects to be discharged to:: Skilled nursing facility                   Additional Comments: had been in [place] but most recently [facility] - wants other options. Ultimate goal would be to move back to [facility] closer to family    Prior Function Prior Level of Function : Needs assist;History of Falls (last six months)  Mobility Comments: has had 3 falls (one needing 17 staples in head) while at SNF. has been getting herself to the [facility], and small bouts of walking with RW ADLs Comments: assist at bed level since Sept admission, but does SPT to [facility]     Extremity/Trunk Assessment   Upper Extremity Assessment Upper Extremity Assessment: Overall WFL for tasks assessed    Lower Extremity Assessment Lower Extremity Assessment: Generalized weakness (noted  skin changes and peeling lower legs)    Cervical / Trunk Assessment Cervical / Trunk Assessment: Other exceptions Cervical / Trunk Exceptions: s/p gastric  bypass sx - morbidly obese  Communication   Communication Communication: No apparent difficulties Cueing Techniques: Verbal cues;Tactile cues  Cognition Arousal: Alert Behavior During Therapy: WFL for tasks assessed/performed Overall Cognitive Status: Within Functional Limits for tasks assessed                                 General Comments: oriented x4, sometimes needs to be redirected but overall very cooperative, pleasant and fun        General Comments General comments (skin integrity, edema, etc.): VSS throughout session, Sister [name] present and supportive. Pt is motivated    Exercises     Assessment/Plan    PT Assessment Patient needs continued PT services  PT Problem List Decreased strength       PT Treatment Interventions DME instruction;Therapeutic activities;Gait training;Functional mobility training;Patient/family education    PT Goals (Current goals can be found in the Care Plan section)  Acute Rehab PT Goals Patient Stated Goal: to move back to be  with family PT Goal Formulation: With patient/family Time For Goal Achievement: 04/11/23 Potential to Achieve Goals: Good    Frequency Min 1X/week     Co-evaluation PT/OT/SLP Co-Evaluation/Treatment: Yes Reason for Co-Treatment: For patient/therapist safety;To address functional/ADL transfers PT goals addressed during session: Balance;Mobility/safety with mobility;Proper use of DME;Strengthening/ROM OT goals addressed during session: ADL's and self-care;Proper use of Adaptive equipment and DME;Strengthening/ROM       AM-PAC PT "6 Clicks" Mobility  Outcome Measure Help needed turning from your back to your side while in a flat bed without using bedrails?: A Lot Help needed moving from lying on your back to sitting on the side of a flat bed without using bedrails?: A Lot Help needed moving to and from a bed to a chair (including a wheelchair)?: A Lot Help needed standing up from a chair using  your arms (e.g., wheelchair or bedside chair)?: A Lot Help needed to walk in hospital room?: Total Help needed climbing 3-5 steps with a railing? : Total 6 Click Score: 10    End of Session   Activity Tolerance: Patient tolerated treatment well Patient left: in chair;with call bell/phone within reach;with chair alarm set;with family/visitor present Nurse Communication: Mobility status PT Visit Diagnosis: Unsteadiness on feet (R26.81);Difficulty in walking, not elsewhere classified (R26.2);Pain    Time: 0912-0938 PT Time Calculation (min) (ACUTE ONLY): 26 min   Charges:   PT Evaluation $PT Eval Low Complexity: 1 Low   PT General Charges $$ ACUTE PT VISIT: 1 Visit         Blanchard Kelch PT Acute Rehabilitation Services Office [phone] Weekend [phone]   Rada Hay 03/28/2023, 1:17 PM

## 2023-03-28 NOTE — Progress Notes (Signed)
     Name: Christine [name]                 Patient MRN: [id number]  DOA: 03/21/2023   Patient seen in room 1522  Patient in bed. Both clear and full liquids at bedside. Patient A&O, no c/o of pain. Patient stated that prior to her arrival she was having intense abdominal discomfort that led to her arrival in the ED and receiving the Exp Lap surgery on 03/21/23. Patient has since been recovering postoperatively and has been able to tolerate intake of fluids.  Abd. Soft and non-tender. Dressing c/d/I JP drain on right side had scant serosanguinous fluid and G-tube on left side had color consistent with mucoid drainage.   Recent vital signs.    03/28/2023    8:07 PM 03/28/2023    4:44 PM 03/28/2023   12:36 PM  Vitals with BMI  Systolic 139 126 010  Diastolic 71 67 68  Pulse 102 90 103     Intake/Output:   Intake/Output Summary (Last 24 hours) at 03/28/2023 2032 Last data filed at 03/28/2023 1850 Gross per 24 hour  Intake 678 ml  Output 830 ml  Net -152 ml    Patient had no further questions or concerns presented at this time. Checked in with nursing staff, No further questions provided from nursing staff.     Thank you,  Lubertha Basque, RN, MSN Bariatric Nurse Coordinator [phone] (office)

## 2023-03-28 NOTE — Progress Notes (Addendum)
PROGRESS NOTE    Christine [name]  WGN:[id number] DOB: February 10, 1951 DOA: 03/21/2023 PCP: Pcp, No    Brief Narrative:   Patient is a 72 year old female history of hypertension, hyperlipidemia, OSA, GERD, morbid obesity, 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  Patient found to have intra-abdominal free air and fluid and concern for possible GJ anastomosis ulceration resulting in perforation.  Patient also noted to have significant abdominal pain and vomiting for 3 days prior to admission.  Patient was seen by general surgery and underwent  exploratory laparotomy, abdominal washout, Graham patch repair of perforated gastrojejunal ulcer, intraperitoneal drain placement.  Patient subsequently admitted to the stepdown unit for closer monitoring since hypotensive and also in acute renal failure.   Assessment & Plan:   Principal Problem:   Bowel perforation (HCC) Active Problems:   Normocytic anemia   GAD (generalized anxiety disorder)   Anxiety   Hyperlipidemia   Hypernatremia   ARF (acute renal failure) (HCC)   Hypotension   Metabolic acidosis   Perforated marginal ulcer Likely secondary to G-tube displacement versus GJ anastomosis ulceration.  Patient underwent  expiratory laparotomy, abdominal washout, Graham patch repair of perforated gastrojejunal ulcer, intraperitoneal drain placement on 03/21/2023.  Seen by general surgery today and has been started on full liquids.  Has tolerated full liquids. On IV Zosyn PPI.   Discussed with general surgery and plan to discontinue IV Zosyn at this time..  Afebrile. Follow general surgery recommendations.  Will discontinue Foley catheter.  Metabolic encephalopathy.  Resolved.  Likely multifactorial from  surgery, IV narcotics and renal failure and hypoglycemia.  On D10.  Will discontinue today if her oral intake is  adequate.  Hypoglycemia.  Now improved with D10.  Has been started on full liquids.  Latest POC glucose of 77.   Hypotension Resolved at this time.  Acute renal failure with metabolic acidosis Creatinine today at 0.9 and at baseline.  Will give 1 dose of IV Lasix today.  Hypokalemia.  Improved with replacement.  Potassium 3.5.  Probable UTI On IV Zosyn.  Urine culture with 60,000 colonies of E. coli resistant to multiple antibiotics except Zosyn.  Currently on Zosyn will continue.   New ST depression On admission.  No previous EKG. Admitting physician discussed with cardiology and recommended monitoring.  Patient did have tachycardia.  2D echocardiogram with LV function of 50 to 55% with no regional wall motion abnormality. May need outpatient follow-up with cardiology.  GERD Continue PPI.  Hypernatremia Improved at this time.  Improved.  Latest sodium of 134  Hypomagnesemia.  Improved after replacement.  Latest magnesium of 2.0   Anemia Ferritin of 229, serum iron low at 11 TIBC low, folic acid normal at 19, vitamin B12 elevated at 1005. Continue to monitor CBC.  Transfuse for hemoglobin less than 8.  Bilateral medial aspect of leg tenderness.  Has a chronic venous stasis.  Likely from fluid retention.  Will consider IV Lasix x 1 today.  Debility, deconditioning.  Will get PT OT evaluation.  Might need rehabilitation placement.  Pending assessment at this time  DVT prophylaxis: Lovenox subcu.  Code Status: DNR  Family Communication:  I again spoke with  the patient's sister [name] at bedside on 03/28/2023 instead inquiring about rehabilitation in [place].  Disposition: Likely  skilled nursing facility await PT recommendation when okay with general surgery.  Will transfer the patient out of the stepdown  unit today.  Status is: Inpatient Remains inpatient appropriate because: Severity of illness, pending clinical improvement, PT OT pending   Consultants:  IR General  Surgery: Dr.Gerkin 03/21/2023  Procedures:   Gastrostomy tube injection manipulation with fluoroscopy per IR: Dr. Lowella Dandy 03/21/2023  Exploratory laparotomy, abdominal washout, [name] patch repair of perforated gastrojejunal ulcer, intraperitoneal drain placement per general surgery: Dr. Freida Busman 03/21/2023  Antimicrobials:  Zosyn IV  Subjective  Today, patient was seen and examined at bedside.  Patient continues to feel better but overall weak.  Was able to tolerate bariatric full liquid. Had Bowel movements.  Denies any nausea, vomiting fever chills or rigor.     Objective: Vitals:   03/28/23 0610 03/28/23 0621 03/28/23 0700 03/28/23 0800  BP: (!) 142/73  (!) 147/56   Pulse: 84 89 92   Resp: 19 19 19    Temp:    (!) 97.4 F (36.3 C)  TempSrc:    Oral  SpO2: 100% 97% 98%   Weight:      Height:        Intake/Output Summary (Last 24 hours) at 03/28/2023 0956 Last data filed at 03/28/2023 0653 Gross per 24 hour  Intake 1855.48 ml  Output 570 ml  Net 1285.48 ml   Filed Weights   03/23/23 1024  Weight: 105.6 kg    Physical examination:  Body mass index is 43.99 kg/m.   General: Obese built, not in obvious distress, on room air, alert awake and oriented.   HENT:   No scleral pallor or icterus noted. Oral mucosa is moist.  Chest:    Diminished breath sounds bilaterally. No crackles or wheezes.  CVS: S1 &S2 heard. No murmur.  Regular rate and rhythm.  Abdomen: Diffuse tenderness on palpation, dressing in place with right lower quadrant JP drain.  Marland Kitchen  Hypoactive bowel sounds.  Urethral catheter in place. Extremities: No cyanosis, clubbing or edema.   Psych: Appears to be alert awake and oriented CNS: Moves all extremities, nonfocal Skin: Warm and dry.  Surgical dressing in place in the abdomen with drain.   Data Reviewed: I have personally reviewed following labs and imaging studies  CBC: Recent Labs  Lab 03/23/23 0335 03/23/23 1950 03/25/23 0329 03/26/23 0338  03/27/23 0242 03/28/23 0252  WBC 16.1* 19.4* 13.2* 11.0* 10.4 11.0*  NEUTROABS 13.4*  --   --   --   --   --   HGB 10.3* 11.0* 9.7* 9.7* 8.0* 8.3*  HCT 34.4* 34.4* 30.1* 31.8* 25.8* 26.1*  MCV 98.6 94.5 92.9 96.4 93.5 92.9  PLT 249 192 259 228 247 251    Basic Metabolic Panel: Recent Labs  Lab 03/23/23 0335 03/23/23 1950 03/25/23 0329 03/26/23 0338 03/27/23 0242 03/28/23 0252  NA 147* 143 141 143 138 134*  K 3.3* 4.1 2.9* 3.1* 3.0* 3.5  CL 113* 112* 109 111 110 107  CO2 22 21* 23 23 21* 19*  GLUCOSE 78 121* 93 81 86 81  BUN 36* 35* 32* 26* 17 13  CREATININE 2.11* 1.85* 1.43* 1.23* 1.01* 0.92  CALCIUM 7.8* 7.4* 7.6* 7.8* 7.4* 7.6*  MG 1.3*  --  1.4* 2.4 2.0 2.0  PHOS 3.0  --   --  3.1 2.6  --     GFR: Estimated Creatinine Clearance: 63.7 mL/min (by C-G formula based on SCr of 0.92 mg/dL).  Liver Function Tests: Recent Labs  Lab 03/23/23 0335 03/26/23 0338 03/27/23 0242  AST 48* 33 27  ALT 29 23 22   ALKPHOS 147* 177*  164*  BILITOT 0.9 0.7 0.7  PROT 5.6* 5.2* 4.7*  ALBUMIN 2.4* 1.9* 1.7*    CBG: Recent Labs  Lab 03/27/23 2005 03/27/23 2031 03/27/23 2307 03/28/23 0349 03/28/23 0814  GLUCAP 72 82 76 74 77     Recent Results (from the past 240 hour(s))  MRSA Next Gen by PCR, Nasal     Status: None   Collection Time: 03/21/23  8:21 PM   Specimen: Nasal Mucosa; Nasal Swab  Result Value Ref Range Status   MRSA by PCR Next Gen NOT DETECTED NOT DETECTED Final    Comment: (NOTE) The GeneXpert MRSA Assay (FDA approved for NASAL specimens only), is one component of a comprehensive MRSA colonization surveillance program. It is not intended to diagnose MRSA infection nor to guide or monitor treatment for MRSA infections. Test performance is not FDA approved in patients less than 69 years old. Performed at [facility], [address] [place], [place] [address]   Urine Culture (for pregnant, neutropenic or urologic patients or patients  with an indwelling urinary catheter)     Status: Abnormal   Collection Time: 03/22/23 10:26 AM   Specimen: Urine, Catheterized  Result Value Ref Range Status   Specimen Description   Final    URINE, CATHETERIZED Performed at [facility], [address] [place], [place] [address]    Special Requests   Final    NONE Performed at [facility], [address], [place], [place] [address]    Culture (A)  Final    60,000 COLONIES/mL ESCHERICHIA COLI Confirmed Extended Spectrum Beta-Lactamase Producer (ESBL).  In bloodstream infections from ESBL organisms, carbapenems are preferred over piperacillin/tazobactam. They are shown to have a lower risk of mortality.    Report Status 03/25/2023 FINAL  Final   Organism ID, Bacteria ESCHERICHIA COLI (A)  Final      Susceptibility   Escherichia coli - MIC*    AMPICILLIN >=32 RESISTANT Resistant     CEFAZOLIN >=64 RESISTANT Resistant     CEFEPIME >=32 RESISTANT Resistant     CEFTRIAXONE >=64 RESISTANT Resistant     CIPROFLOXACIN >=4 RESISTANT Resistant     GENTAMICIN >=16 RESISTANT Resistant     IMIPENEM <=0.25 SENSITIVE Sensitive     NITROFURANTOIN <=16 SENSITIVE Sensitive     TRIMETH/SULFA >=320 RESISTANT Resistant     AMPICILLIN/SULBACTAM 16 INTERMEDIATE Intermediate     PIP/TAZO <=4 SENSITIVE Sensitive ug/mL    * 60,000 COLONIES/mL ESCHERICHIA COLI  Culture, blood (Routine X 2) w Reflex to ID Panel     Status: None   Collection Time: 03/22/23  9:16 PM   Specimen: BLOOD LEFT ARM  Result Value Ref Range Status   Specimen Description   Final    BLOOD LEFT ARM Performed at [facility], [address], [place], [place] [address]    Special Requests   Final    BOTTLES DRAWN AEROBIC ONLY Blood Culture results may not be optimal due to an inadequate volume of blood received in culture bottles Performed at [facility], [address] [place], [place] [address]     Culture   Final    NO GROWTH 5 DAYS Performed at [facility], [address] [place], [place] [address]    Report Status 03/27/2023 FINAL  Final  Culture, blood (Routine X 2) w Reflex to ID Panel     Status: None   Collection Time: 03/22/23  9:16 PM  Specimen: BLOOD RIGHT HAND  Result Value Ref Range Status   Specimen Description   Final    BLOOD RIGHT HAND Performed at [facility], [address], [place], [place] [address]    Special Requests   Final    BOTTLES DRAWN AEROBIC ONLY Blood Culture results may not be optimal due to an inadequate volume of blood received in culture bottles Performed at [facility], [address], [place], [place] [address]    Culture   Final    NO GROWTH 5 DAYS Performed at [facility], [address], [place], [place] [address]    Report Status 03/27/2023 FINAL  Final       Radiology Studies: No results found.    Scheduled Meds:  ascorbic acid  500 mg Oral Daily   Chlorhexidine Gluconate Cloth  6 each Topical Daily   vitamin D3  5,000 Units Oral Daily   enoxaparin (LOVENOX) injection  40 mg Subcutaneous Q24H   folic acid  1 mg Oral Daily   gabapentin  100 mg Oral TID   multivitamin with minerals  1 tablet Oral Daily   mouth rinse  15 mL Mouth Rinse 4 times per day   pantoprazole (PROTONIX) IV  40 mg Intravenous Q12H   zinc sulfate (50mg  elemental zinc)  220 mg Oral Daily   Continuous Infusions:  dextrose 50 mL/hr at 03/28/23 0653   piperacillin-tazobactam (ZOSYN)  IV Stopped (03/28/23 0936)     LOS: 7 days     Joycelyn Das, MD [facility] 03/28/2023, 9:56 AM

## 2023-03-29 DIAGNOSIS — F419 Anxiety disorder, unspecified: Secondary | ICD-10-CM | POA: Diagnosis not present

## 2023-03-29 DIAGNOSIS — K631 Perforation of intestine (nontraumatic): Secondary | ICD-10-CM | POA: Diagnosis not present

## 2023-03-29 DIAGNOSIS — D649 Anemia, unspecified: Secondary | ICD-10-CM | POA: Diagnosis not present

## 2023-03-29 DIAGNOSIS — N179 Acute kidney failure, unspecified: Secondary | ICD-10-CM | POA: Diagnosis not present

## 2023-03-29 LAB — GLUCOSE, CAPILLARY
Glucose-Capillary: 62 mg/dL — ABNORMAL LOW (ref 70–99)
Glucose-Capillary: 62 mg/dL — ABNORMAL LOW (ref 70–99)
Glucose-Capillary: 71 mg/dL (ref 70–99)
Glucose-Capillary: 74 mg/dL (ref 70–99)
Glucose-Capillary: 82 mg/dL (ref 70–99)
Glucose-Capillary: 82 mg/dL (ref 70–99)
Glucose-Capillary: 82 mg/dL (ref 70–99)
Glucose-Capillary: 91 mg/dL (ref 70–99)

## 2023-03-29 LAB — BASIC METABOLIC PANEL
Anion gap: 6 (ref 5–15)
BUN: 13 mg/dL (ref 8–23)
CO2: 20 mmol/L — ABNORMAL LOW (ref 22–32)
Calcium: 7.9 mg/dL — ABNORMAL LOW (ref 8.9–10.3)
Chloride: 111 mmol/L (ref 98–111)
Creatinine, Ser: 0.77 mg/dL (ref 0.44–1.00)
GFR, Estimated: >60 mL/min (ref >60)
Glucose, Bld: 88 mg/dL (ref 70–99)
Potassium: 3.4 mmol/L — ABNORMAL LOW (ref 3.5–5.1)
Sodium: 137 mmol/L (ref 135–145)

## 2023-03-29 LAB — CBC
HCT: 29.1 % — ABNORMAL LOW (ref 36.0–46.0)
Hemoglobin: 9.4 g/dL — ABNORMAL LOW (ref 12.0–15.0)
MCH: 29.6 pg (ref 26.0–34.0)
MCHC: 32.3 g/dL (ref 30.0–36.0)
MCV: 91.5 fL (ref 80.0–100.0)
Platelets: 322 10*3/uL (ref 150–400)
RBC: 3.18 MIL/uL — ABNORMAL LOW (ref 3.87–5.11)
RDW: 17.2 % — ABNORMAL HIGH (ref 11.5–15.5)
WBC: 9.7 10*3/uL (ref 4.0–10.5)
nRBC: 0 % (ref 0.0–0.2)

## 2023-03-29 MED ORDER — OMEPRAZOLE 20 MG PO CPDR
20.0000 mg | DELAYED_RELEASE_CAPSULE | Freq: Two times a day (BID) | ORAL | Status: DC
  Administered 2023-03-29 – 2023-04-06 (×17): 20 mg via ORAL
  Filled 2023-03-29 (×18): qty 1

## 2023-03-29 MED ORDER — SUCRALFATE 1 GM/10ML PO SUSP
1.0000 g | Freq: Three times a day (TID) | ORAL | Status: DC
  Administered 2023-03-29 – 2023-04-01 (×12): 1 g via ORAL
  Filled 2023-03-29 (×13): qty 10

## 2023-03-29 MED ORDER — DEXTROSE 50 % IV SOLN
12.5000 g | INTRAVENOUS | Status: AC
  Administered 2023-03-29: 12.5 g via INTRAVENOUS
  Filled 2023-03-29: qty 50

## 2023-03-29 MED ORDER — FUROSEMIDE 10 MG/ML IJ SOLN
40.0000 mg | Freq: Once | INTRAMUSCULAR | Status: AC
  Administered 2023-03-29: 40 mg via INTRAVENOUS
  Filled 2023-03-29: qty 4

## 2023-03-29 MED ORDER — POTASSIUM CHLORIDE 2 MEQ/ML IV SOLN
INTRAVENOUS | Status: AC
  Filled 2023-03-29 (×3): qty 1000

## 2023-03-29 NOTE — Progress Notes (Signed)
Progress Note  8 Days Post-Op  Subjective: Pt moved out of step down yesterday. Tolerating diet and having bowel function. Pain overall well controlled. She is annoyed that bariatric diet does not allow chocolate milk or cereal. Good UOP since foley removal.   Objective: Vital signs in last 24 hours: Temp:  [98.1 F (36.7 C)-98.4 F (36.9 C)] 98.1 F (36.7 C) (12/03 0451) Pulse Rate:  [90-103] 93 (12/03 0451) Resp:  [13-20] 14 (12/03 0451) BP: (110-139)/(67-72) 122/72 (12/03 0451) SpO2:  [97 %-100 %] 100 % (12/03 0451) Last BM Date : 03/28/23  Intake/Output from previous day: 12/02 0701 - 12/03 0700 In: 69.8 [I.V.:55.9; IV Piggyback:13.9] Out: 270 [Urine:225; Drains:45] Intake/Output this shift: No intake/output data recorded.  PE: General: pleasant, WD, obese female who is laying in bed in NAD Lungs: Respiratory effort nonlabored Abd: soft, appropriately ttp, G tube clamped, JP SS, midline dressing CDI    Lab Results:  Recent Labs    03/28/23 0252 03/29/23 0812  WBC 11.0* 9.7  HGB 8.3* 9.4*  HCT 26.1* 29.1*  PLT 251 322   BMET Recent Labs    03/28/23 0252 03/29/23 0812  NA 134* 137  K 3.5 3.4*  CL 107 111  CO2 19* 20*  GLUCOSE 81 88  BUN 13 13  CREATININE 0.92 0.77  CALCIUM 7.6* 7.9*   PT/INR No results for input(s): "LABPROT", "INR" in the last 72 hours. CMP     Component Value Date/Time   NA 137 03/29/2023 0812   K 3.4 (L) 03/29/2023 0812   CL 111 03/29/2023 0812   CO2 20 (L) 03/29/2023 0812   GLUCOSE 88 03/29/2023 0812   BUN 13 03/29/2023 0812   CREATININE 0.77 03/29/2023 0812   CREATININE 0.91 07/26/2017 1127   CALCIUM 7.9 (L) 03/29/2023 0812   PROT 4.7 (L) 03/27/2023 0242   ALBUMIN 1.7 (L) 03/27/2023 0242   AST 27 03/27/2023 0242   AST 15 07/26/2017 1127   ALT 22 03/27/2023 0242   ALT 14 07/26/2017 1127   ALKPHOS 164 (H) 03/27/2023 0242   BILITOT 0.7 03/27/2023 0242   BILITOT 0.5 07/26/2017 1127   GFRNONAA >60 03/29/2023 0812    GFRNONAA >60 07/26/2017 1127   GFRAA >60 02/28/2019 1022   GFRAA >60 07/26/2017 1127   Lipase     Component Value Date/Time   LIPASE 55 (H) 03/21/2023 0715       Studies/Results: No results found.  Anti-infectives: Anti-infectives (From admission, onward)    Start     Dose/Rate Route Frequency Ordered Stop   03/21/23 1330  piperacillin-tazobactam (ZOSYN) IVPB 3.375 g  Status:  Discontinued        3.375 g 12.5 mL/hr over 240 Minutes Intravenous Every 8 hours 03/21/23 1325 03/28/23 1021        Assessment/Plan  Perforated marginal ulcer POD#8 - status post ex lap, abdominal washout, Graham patch repair of perforated gastrojejunal ulcer, drain placement  Dr. Freida Busman - advance to bari advanced  - WBC normalized and afebrile - zosyn stopped yesterday  - BID PPI - send stool for H pylori  - drain with 45 cc out in last 24h - can likely DC prior to discharge - midline clean  - mobilize as able - stable for DC from surgery perspective once medically cleared   FEN: bari advanced VTE: LMWH ID: Zosyn 11/25>12/2  LOS: 8 days     Christine [name], [facility] 03/29/2023, 10:06 AM Please see Amion for pager number  during day hours 7:00am-4:30pm

## 2023-03-29 NOTE — Progress Notes (Signed)
PROGRESS NOTE    Christine [name]  ZOX:[id number] DOB: 07/27/1950 DOA: 03/21/2023 PCP: Pcp, No    Brief Narrative:   Patient is a 72 year old female history of hypertension, hyperlipidemia, OSA, GERD, morbid obesity, 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  Patient found to have intra-abdominal free air and fluid and concern for possible GJ anastomosis ulceration resulting in perforation.  Patient also noted to have significant abdominal pain and vomiting for 3 days prior to admission.  Patient was seen by general surgery and underwent  exploratory laparotomy, abdominal washout, Graham patch repair of perforated gastrojejunal ulcer, intraperitoneal drain placement.  Patient subsequently admitted to the stepdown unit for closer monitoring since hypotensive and also in acute renal failure.  Patient was then transferred out of the stepdown unit.  At this time general surgery is closely following the patient and has been advanced to bariatric advance diet which she has tolerated.  Patient however continues to have hypoglycemia initially on D10 infusion.  Plan is skilled nursing facility on discharge when okay with general surgery.   Assessment & Plan:   Principal Problem:   Bowel perforation (HCC) Active Problems:   Normocytic anemia   GAD (generalized anxiety disorder)   Anxiety   Hyperlipidemia   Hypernatremia   ARF (acute renal failure) (HCC)   Hypotension   Metabolic acidosis   Perforated marginal ulcer Likely secondary to G-tube displacement versus GJ anastomosis ulceration.  Patient underwent  expiratory laparotomy, abdominal washout, Graham patch repair of perforated gastrojejunal ulcer, intraperitoneal drain placement on 03/21/2023.  General surgery on board and has been advanced to bariatric advance diet.  On D10 water due to hypoglycemia.  follow general surgery  recommendation.  H. pylori stool test has been ordered.  Metabolic encephalopathy.  Resolved.  Likely multifactorial from  surgery, IV narcotics and renal failure and hypoglycemia.  Will change to D5 half-normal saline today since diet has been advanced to bariatric advanced diet.  Hypoglycemia.  D10 water for several days.  Will try D5 half-normal saline today since patient has advanced oral diet.   Hypotension Resolved at this time.  Latest blood pressure of 132/68  Acute renal failure with metabolic acidosis Improved at this time.  Latest creatinine of 0.7.    Hypokalemia.  Will replenish with D5 half normal saline.  Check levels in AM.  Potassium today at 3.4  Probable UTI .  Urine culture with 60,000 colonies of E. coli resistant to multiple antibiotics except Zosyn.  Completed course of Zosyn during hospitalization.  Abnormal EKG with ST depression On admission.  No previous EKG. Admitting physician discussed with cardiology and recommended monitoring.  Patient did have tachycardia.  2D echocardiogram with LV function of 50 to 55% with no regional wall motion abnormality. May need outpatient follow-up with cardiology.  GERD Continue PPI.  Hypernatremia Improved at this time.  Improved.  Latest sodium of 137  Hypomagnesemia.  Improved after replacement.  Latest magnesium of 2.0   Anemia Ferritin of 229, serum iron low at 11 TIBC low, folic acid normal at 19, vitamin B12 elevated at 1005. Hemoglobin of 8.3 at this time.  Bilateral medial aspect of leg tenderness.  Has a chronic venous stasis.  Pitting edema noted with some tenderness.  Likely from fluid retention.  Received IV Lasix yesterday and will repeat 1 more dose of IV Lasix 40 mg today.  Continue to monitor potassium levels.  Debility, deconditioning.  PT OT recommended skilled nursing facility on discharge.  DVT prophylaxis: Lovenox subcu.  Code Status: DNR  Family Communication:  I spoke with  the patient's  sister [name] at bedside on 03/27/2023  Disposition: Skilled nursing facility as per PT recommendation when okay with general surgery.  Status is: Inpatient Remains inpatient appropriate because: Severity of illness, pending clinical improvement, need for skilled nursing facility   Consultants:  IR General Surgery:  Procedures:   Gastrostomy tube injection manipulation with fluoroscopy per IR: Dr. Lowella Dandy 03/21/2023  Exploratory laparotomy, abdominal washout, [name] patch repair of perforated gastrojejunal ulcer, intraperitoneal drain placement per general surgery: Dr. Freida Busman 03/21/2023  Antimicrobials:  Zosyn IV-completed  Subjective  Today, patient was seen and examined at bedside.  Patient continues to feel better.  Has tolerated oral diet.  No nausea vomiting fever chills or rigor.  Still has some leg swelling and tenderness.     Objective: Vitals:   03/28/23 1644 03/28/23 2007 03/29/23 0451 03/29/23 1303  BP: 126/67 139/71 122/72 132/68  Pulse: 90 (!) 102 93 (!) 102  Resp:  20 14   Temp: 98.2 F (36.8 C) 98.4 F (36.9 C) 98.1 F (36.7 C) 98 F (36.7 C)  TempSrc:  Oral Oral Oral  SpO2: 100% 97% 100% 100%  Weight:      Height:        Intake/Output Summary (Last 24 hours) at 03/29/2023 1313 Last data filed at 03/29/2023 0430 Gross per 24 hour  Intake --  Output 45 ml  Net -45 ml   Filed Weights   03/23/23 1024  Weight: 105.6 kg    Physical examination:  Body mass index is 43.99 kg/m.   General: Obese built, not in obvious distress, on room air, alert awake and oriented.   HENT:   No scleral pallor or icterus noted. Oral mucosa is moist.  Chest:    Diminished breath sounds bilaterally. No crackles or wheezes.  CVS: S1 &S2 heard. No murmur.  Regular rate and rhythm.  Abdomen: Diffuse tenderness on palpation, dressing in place with right lower quadrant JP drain.  .  .  Urethral catheter in place. Extremities: No cyanosis, clubbing or edema.   Psych: Appears  to be alert awake and oriented CNS: Moves all extremities, nonfocal Skin: Warm and dry.  Surgical dressing in place in the abdomen with drain.   Data Reviewed: I have personally reviewed following labs and imaging studies  CBC: Recent Labs  Lab 03/23/23 0335 03/23/23 1950 03/25/23 0329 03/26/23 0338 03/27/23 0242 03/28/23 0252 03/29/23 0812  WBC 16.1*   < > 13.2* 11.0* 10.4 11.0* 9.7  NEUTROABS 13.4*  --   --   --   --   --   --   HGB 10.3*   < > 9.7* 9.7* 8.0* 8.3* 9.4*  HCT 34.4*   < > 30.1* 31.8* 25.8* 26.1* 29.1*  MCV 98.6   < > 92.9 96.4 93.5 92.9 91.5  PLT 249   < > 259 228 247 251 322   < > = values in this interval not displayed.    Basic Metabolic Panel: Recent Labs  Lab 03/23/23 0335 03/23/23 1950 03/25/23 0329 03/26/23 0338 03/27/23 0242 03/28/23 0252 03/29/23 0812  NA 147*   < > 141 143 138 134* 137  K 3.3*   < > 2.9* 3.1* 3.0* 3.5 3.4*  CL 113*   < > 109 111 110 107 111  CO2 22   < > 23  23 21* 19* 20*  GLUCOSE 78   < > 93 81 86 81 88  BUN 36*   < > 32* 26* 17 13 13   CREATININE 2.11*   < > 1.43* 1.23* 1.01* 0.92 0.77  CALCIUM 7.8*   < > 7.6* 7.8* 7.4* 7.6* 7.9*  MG 1.3*  --  1.4* 2.4 2.0 2.0  --   PHOS 3.0  --   --  3.1 2.6  --   --    < > = values in this interval not displayed.    GFR: Estimated Creatinine Clearance: 73.2 mL/min (by C-G formula based on SCr of 0.77 mg/dL).  Liver Function Tests: Recent Labs  Lab 03/23/23 0335 03/26/23 0338 03/27/23 0242  AST 48* 33 27  ALT 29 23 22   ALKPHOS 147* 177* 164*  BILITOT 0.9 0.7 0.7  PROT 5.6* 5.2* 4.7*  ALBUMIN 2.4* 1.9* 1.7*    CBG: Recent Labs  Lab 03/28/23 2004 03/29/23 0051 03/29/23 0444 03/29/23 0736 03/29/23 1203  GLUCAP 78 74 71 82 82     Recent Results (from the past 240 hour(s))  MRSA Next Gen by PCR, Nasal     Status: None   Collection Time: 03/21/23  8:21 PM   Specimen: Nasal Mucosa; Nasal Swab  Result Value Ref Range Status   MRSA by PCR Next Gen NOT DETECTED NOT  DETECTED Final    Comment: (NOTE) The GeneXpert MRSA Assay (FDA approved for NASAL specimens only), is one component of a comprehensive MRSA colonization surveillance program. It is not intended to diagnose MRSA infection nor to guide or monitor treatment for MRSA infections. Test performance is not FDA approved in patients less than 53 years old. Performed at [facility], [address] [place], [place] [address]   Urine Culture (for pregnant, neutropenic or urologic patients or patients with an indwelling urinary catheter)     Status: Abnormal   Collection Time: 03/22/23 10:26 AM   Specimen: Urine, Catheterized  Result Value Ref Range Status   Specimen Description   Final    URINE, CATHETERIZED Performed at [facility], [address] [place], [place] [address]    Special Requests   Final    NONE Performed at [facility], [address], [place], [place] [address]    Culture (A)  Final    60,000 COLONIES/mL ESCHERICHIA COLI Confirmed Extended Spectrum Beta-Lactamase Producer (ESBL).  In bloodstream infections from ESBL organisms, carbapenems are preferred over piperacillin/tazobactam. They are shown to have a lower risk of mortality.    Report Status 03/25/2023 FINAL  Final   Organism ID, Bacteria ESCHERICHIA COLI (A)  Final      Susceptibility   Escherichia coli - MIC*    AMPICILLIN >=32 RESISTANT Resistant     CEFAZOLIN >=64 RESISTANT Resistant     CEFEPIME >=32 RESISTANT Resistant     CEFTRIAXONE >=64 RESISTANT Resistant     CIPROFLOXACIN >=4 RESISTANT Resistant     GENTAMICIN >=16 RESISTANT Resistant     IMIPENEM <=0.25 SENSITIVE Sensitive     NITROFURANTOIN <=16 SENSITIVE Sensitive     TRIMETH/SULFA >=320 RESISTANT Resistant     AMPICILLIN/SULBACTAM 16 INTERMEDIATE Intermediate     PIP/TAZO <=4 SENSITIVE Sensitive ug/mL    * 60,000 COLONIES/mL ESCHERICHIA COLI  Culture, blood (Routine X 2) w Reflex to ID  Panel     Status: None   Collection Time: 03/22/23  9:16 PM   Specimen: BLOOD LEFT ARM  Result  Value Ref Range Status   Specimen Description   Final    BLOOD LEFT ARM Performed at [facility], [address], [place], [place] [address]    Special Requests   Final    BOTTLES DRAWN AEROBIC ONLY Blood Culture results may not be optimal due to an inadequate volume of blood received in culture bottles Performed at [facility], [address], [place], [place] [address]    Culture   Final    NO GROWTH 5 DAYS Performed at [facility], [address] [place], [place] [address]    Report Status 03/27/2023 FINAL  Final  Culture, blood (Routine X 2) w Reflex to ID Panel     Status: None   Collection Time: 03/22/23  9:16 PM   Specimen: BLOOD RIGHT HAND  Result Value Ref Range Status   Specimen Description   Final    BLOOD RIGHT HAND Performed at [facility], [address] [place], [place] [address]    Special Requests   Final    BOTTLES DRAWN AEROBIC ONLY Blood Culture results may not be optimal due to an inadequate volume of blood received in culture bottles Performed at [facility], [address] [place], [place] [address]    Culture   Final    NO GROWTH 5 DAYS Performed at [facility], [address], [place], [place] [address]    Report Status 03/27/2023 FINAL  Final       Radiology Studies: No results found.    Scheduled Meds:  acetaminophen  650 mg Oral Q6H   ascorbic acid  500 mg Oral Daily   Chlorhexidine Gluconate Cloth  6 each Topical Daily   vitamin D3  5,000 Units Oral Daily   enoxaparin (LOVENOX) injection  40 mg Subcutaneous Q24H   folic acid  1 mg Oral Daily   gabapentin  100 mg Oral TID   multivitamin with minerals  1 tablet Oral Daily   omeprazole  20 mg Oral BID   mouth rinse  15 mL Mouth Rinse 4 times per day   sucralfate  1 g Oral TID WC & HS   zinc sulfate  (50mg  elemental zinc)  220 mg Oral Daily   Continuous Infusions:  dextrose 5 % and 0.45 % NaCl 1,000 mL with potassium chloride 30 mEq infusion       LOS: 8 days     Joycelyn Das, MD [facility] 03/29/2023, 1:13 PM

## 2023-03-29 NOTE — Plan of Care (Signed)
  Problem: Education: Goal: Knowledge of General Education information will improve Description Including pain rating scale, medication(s)/side effects and non-pharmacologic comfort measures Outcome: Progressing   Problem: Nutrition: Goal: Adequate nutrition will be maintained Outcome: Progressing   Problem: Elimination: Goal: Will not experience complications related to bowel motility Outcome: Progressing   Problem: Skin Integrity: Goal: Risk for impaired skin integrity will decrease Outcome: Progressing

## 2023-03-29 NOTE — Plan of Care (Signed)

## 2023-03-30 DIAGNOSIS — N179 Acute kidney failure, unspecified: Secondary | ICD-10-CM | POA: Diagnosis not present

## 2023-03-30 DIAGNOSIS — D649 Anemia, unspecified: Secondary | ICD-10-CM | POA: Diagnosis not present

## 2023-03-30 DIAGNOSIS — E162 Hypoglycemia, unspecified: Secondary | ICD-10-CM | POA: Diagnosis not present

## 2023-03-30 DIAGNOSIS — K631 Perforation of intestine (nontraumatic): Secondary | ICD-10-CM | POA: Diagnosis not present

## 2023-03-30 LAB — GLUCOSE, CAPILLARY
Glucose-Capillary: 102 mg/dL — ABNORMAL HIGH (ref 70–99)
Glucose-Capillary: 117 mg/dL — ABNORMAL HIGH (ref 70–99)
Glucose-Capillary: 51 mg/dL — ABNORMAL LOW (ref 70–99)
Glucose-Capillary: 55 mg/dL — ABNORMAL LOW (ref 70–99)
Glucose-Capillary: 64 mg/dL — ABNORMAL LOW (ref 70–99)
Glucose-Capillary: 68 mg/dL — ABNORMAL LOW (ref 70–99)
Glucose-Capillary: 71 mg/dL (ref 70–99)
Glucose-Capillary: 74 mg/dL (ref 70–99)
Glucose-Capillary: 78 mg/dL (ref 70–99)
Glucose-Capillary: 88 mg/dL (ref 70–99)
Glucose-Capillary: 89 mg/dL (ref 70–99)

## 2023-03-30 LAB — BASIC METABOLIC PANEL
Anion gap: 7 (ref 5–15)
BUN: 9 mg/dL (ref 8–23)
CO2: 20 mmol/L — ABNORMAL LOW (ref 22–32)
Calcium: 7.7 mg/dL — ABNORMAL LOW (ref 8.9–10.3)
Chloride: 109 mmol/L (ref 98–111)
Creatinine, Ser: 0.84 mg/dL (ref 0.44–1.00)
GFR, Estimated: >60 mL/min (ref >60)
Glucose, Bld: 72 mg/dL (ref 70–99)
Potassium: 3.6 mmol/L (ref 3.5–5.1)
Sodium: 136 mmol/L (ref 135–145)

## 2023-03-30 LAB — CBC
HCT: 27.7 % — ABNORMAL LOW (ref 36.0–46.0)
Hemoglobin: 8.9 g/dL — ABNORMAL LOW (ref 12.0–15.0)
MCH: 29.5 pg (ref 26.0–34.0)
MCHC: 32.1 g/dL (ref 30.0–36.0)
MCV: 91.7 fL (ref 80.0–100.0)
Platelets: 381 10*3/uL (ref 150–400)
RBC: 3.02 MIL/uL — ABNORMAL LOW (ref 3.87–5.11)
RDW: 17.2 % — ABNORMAL HIGH (ref 11.5–15.5)
WBC: 10.7 10*3/uL — ABNORMAL HIGH (ref 4.0–10.5)
nRBC: 0 % (ref 0.0–0.2)

## 2023-03-30 LAB — MAGNESIUM: Magnesium: 1.8 mg/dL (ref 1.7–2.4)

## 2023-03-30 MED ORDER — DEXTROSE 50 % IV SOLN
25.0000 g | INTRAVENOUS | Status: AC
  Administered 2023-03-30: 25 g via INTRAVENOUS
  Filled 2023-03-30: qty 50

## 2023-03-30 MED ORDER — DULOXETINE HCL 30 MG PO CPEP
60.0000 mg | ORAL_CAPSULE | Freq: Every day | ORAL | Status: DC
  Administered 2023-03-30 – 2023-04-06 (×8): 60 mg via ORAL
  Filled 2023-03-30 (×9): qty 2

## 2023-03-30 MED ORDER — FUROSEMIDE 20 MG PO TABS
20.0000 mg | ORAL_TABLET | Freq: Every day | ORAL | Status: DC
  Administered 2023-03-31 – 2023-04-01 (×2): 20 mg via ORAL
  Filled 2023-03-30 (×3): qty 1

## 2023-03-30 MED ORDER — DEXTROSE-SODIUM CHLORIDE 5-0.45 % IV SOLN: INTRAVENOUS | Status: DC

## 2023-03-30 NOTE — Plan of Care (Signed)
Plan of Care reviewed. 

## 2023-03-30 NOTE — TOC Progression Note (Signed)
Transition of Care [facility]) - Progression Note    Patient Details  Name: Christine [name] MRN: [id number] Date of Birth: 08-22-1950  [facility] CM/SW Contact  Am[place], [place] Phone Number: 03/30/2023, 3:32 PM  Clinical Narrative:     Have spoken with pt, sister and [facility] today to clarify and confirm dc plans.  Pt did not really engage with me other than nod her head "yes" to questions while keeping eyes closed.  Pt was agreeable with CSW contacting her sister and SNF to discuss her care needs. Able to reach sister, who confirms pt has been in SNF at [facility] for a few months and sister fully intends for pt to return there.  She is aware that pt has voiced with some staff that she does not want to return, but sister feels this is best plan and will speak with pt further.  Have spoken with facility who, also, confirms pt is clear to return when medically stable.  CSW trying to confirm with the facility business office the # of SNF days used/ co pay days and whether a Medicaid application (for LTC) may have been started while she was there.  My concern is that we may not be able to get insurance authorization for another SNF admit.  Hope to have information needed from facility by tomorrow.  Expected Discharge Plan: Skilled Nursing Facility Barriers to Discharge: Continued Medical Work up  Expected Discharge Plan and Services In-house Referral: NA Discharge Planning Services: CM Consult Post Acute Care Choice: Skilled Nursing Facility Living arrangements for the past 2 months: Skilled Nursing Facility                 DME Arranged: N/A DME Agency: NA       HH Arranged: NA HH Agency: NA         Social Determinants of Health (SDOH) Interventions SDOH Screenings   Food Insecurity: No Food Insecurity (03/23/2023)  Housing: Patient Declined (03/23/2023)  Transportation Needs: No Transportation Needs (03/23/2023)  Utilities: Not At Risk  (03/23/2023)  Alcohol Screen: Low Risk  (07/13/2022)  Depression (PHQ2-9): Low Risk  (01/21/2022)  Financial Resource Strain: Low Risk  (12/01/2022)   Received from [facility]  Physical Activity: Insufficiently Active (07/13/2022)  Social Connections: Moderately Integrated (12/01/2022)   Received from [facility]  Stress: No Stress Concern Present (07/13/2022)  Tobacco Use: Medium Risk (03/21/2023)  Health Literacy: Medium Risk (12/01/2022)   [facility] from [facility]    Readmission Risk Interventions    03/23/2023   10:43 AM 01/09/2023   11:11 AM  Readmission Risk Prevention Plan  Transportation Screening Complete Complete  HRI or Home Care Consult  Complete  Social Work Consult for Recovery Care Planning/Counseling  Complete  Palliative Care Screening  Not Applicable  Medication Review Oceanographer) Complete Complete  PCP or Specialist appointment within 3-5 days of discharge Complete   HRI or Home Care Consult Complete   SW Recovery Care/Counseling Consult Complete   Palliative Care Screening Not Applicable   Skilled Nursing Facility Complete

## 2023-03-30 NOTE — Discharge Instructions (Addendum)
[facility]      [facility], [place] [phone]  OPEN ABDOMINAL SURGERY: POST OP INSTRUCTIONS  Always review your discharge instruction sheet given to you by the facility where your surgery was performed.  IF YOU HAVE DISABILITY OR FAMILY LEAVE FORMS, YOU MUST BRING THEM TO THE OFFICE FOR PROCESSING.  PLEASE DO NOT GIVE THEM TO YOUR DOCTOR.  A prescription for pain medication may be given to you upon discharge.  Take your pain medication as prescribed, if needed.  If narcotic pain medicine is not needed, then you may take acetaminophen (Tylenol) or ibuprofen (Advil) as needed. Take your usually prescribed medications unless otherwise directed. If you need a refill on your pain medication, please contact your pharmacy. They will contact our office to request authorization.  Prescriptions will not be filled after 5pm or on week-ends. You should follow a light diet the first few days after arrival home, such as soup and crackers, pudding, etc.unless your doctor has advised otherwise. A high-fiber, low fat diet can be resumed as tolerated.   Be sure to include lots of fluids daily. Most patients will experience some swelling and bruising on the chest and neck area.  Ice packs will help.  Swelling and bruising can take several days to resolve Most patients will experience some swelling and bruising in the area of the incision. Ice pack will help. Swelling and bruising can take several days to resolve..  It is common to experience some constipation if taking pain medication after surgery.  Increasing fluid intake and taking a stool softener will usually help or prevent this problem from occurring.  A mild laxative (Milk of Magnesia or Miralax) should be taken according to package directions if there are no bowel movements after 48 hours.  You may have steri-strips (small skin tapes) in place directly over the incision.  These strips should be left on the skin for 7-10 days.  If your surgeon used skin  glue on the incision, you may shower in 24 hours.  The glue will flake off over the next 2-3 weeks.  Any sutures or staples will be removed at the office during your follow-up visit. You may find that a light gauze bandage over your incision may keep your staples from being rubbed or pulled. You may shower and replace the bandage daily. ACTIVITIES:  You may resume regular (light) daily activities beginning the next day--such as daily self-care, walking, climbing stairs--gradually increasing activities as tolerated.  You may have sexual intercourse when it is comfortable.  Refrain from any heavy lifting or straining until approved by your doctor. You may drive when you no longer are taking prescription pain medication, you can comfortably wear a seatbelt, and you can safely maneuver your car and apply brakes  You should see your doctor in the office for a follow-up appointment approximately two weeks after your surgery.  Make sure that you call for this appointment within a day or two after you arrive home to insure a convenient appointment time.   WHEN TO CALL YOUR DOCTOR: Fever over 101.0 Inability to urinate Nausea and/or vomiting Extreme swelling or bruising Continued bleeding from incision. Increased pain, redness, or drainage from the incision. Difficulty swallowing or breathing Muscle cramping or spasms. Numbness or tingling in hands or feet or around lips.  The clinic staff is available to answer your questions during regular business hours.  Please don't hesitate to call and ask to speak to one of the nurses if you have concerns.  For further questions, please visit [email or web address]   WOUND CARE: - dressing to be changed daily - supplies: sterile saline, kerlix/guaze, scissors, ABD pads, tape  - remove dressing and all packing carefully, moistening with sterile saline as needed to avoid packing/internal dressing sticking to the wound. - clean edges of skin around the  wound with water/gauze, making sure there is no tape debris or leakage left on skin that could cause skin irritation or breakdown. - dampen clean kerlix/gauze with sterile saline and pack wound from wound base to skin level, making sure to take note of any possible areas of wound tracking, tunneling and packing appropriately. Wound can be packed loosely. Trim kerlix/gauze to size if a whole roll/piece is not required. - cover wound with a dry ABD pad and secure with tape.  - write the date/time on the dry dressing/tape to better track when the last dressing change occurred. - apply any skin protectant/powder recommended by clinician to protect skin/skin folds. - change dressing as needed if leakage occurs, wound gets contaminated, or patient requests to shower. - patient may shower daily with wound open and following the shower the wound should be dried and a clean dressing placed.    Additional Discharge Instructions   Please get your medications reviewed and adjusted by your Primary MD.  Please request your Primary MD to go over all Hospital Tests and Procedure/Radiological results at the follow up, please get all Hospital records sent to your Primary MD by signing hospital release before you go home.  If you had Pneumonia of Lung problems at the Hospital: Please get a 2 view Chest X ray done in approximately 4 weeks after hospital discharge or sooner if instructed by your Primary MD.  If you have Congestive Heart Failure: Please call your Cardiologist or Primary MD anytime you have any of the following symptoms:  1) 3 pound weight gain in 24 hours or 5 pounds in 1 week  2) shortness of breath, with or without a dry hacking cough  3) swelling in the hands, feet or stomach  4) if you have to sleep on extra pillows at night in order to breathe  Follow cardiac low salt diet and 1.5 lit/day fluid restriction.  If you have diabetes Accuchecks 4 times/day, Once in AM empty stomach and then  before each meal. Log in all results and show them to your primary doctor at your next visit. If any glucose reading is under 80 or above 300 call your primary MD immediately.  If you have Seizure/Convulsions/Epilepsy: Please do not drive, operate heavy machinery, participate in activities at heights or participate in high speed sports until you have seen by Primary MD or a Neurologist and advised to do so again. Per [facility] statutes, patients with seizures are not allowed to drive until they have been seizure-free for six months.  Use caution when using heavy equipment or power tools. Avoid working on ladders or at heights. Take showers instead of baths. Ensure the water temperature is not too high on the home water heater. Do not go swimming alone. Do not lock yourself in a room alone (i.e. bathroom). When caring for infants or small children, sit down when holding, feeding, or changing them to minimize risk of injury to the child in the event you have a seizure. Maintain good sleep hygiene. Avoid alcohol.   If you had Gastrointestinal Bleeding: Please ask your Primary MD to check a complete blood count within one week of discharge or  at your next visit. Your endoscopic/colonoscopic biopsies that are pending at the time of discharge, will also need to followed by your Primary MD.  Get Medicines reviewed and adjusted. Please take all your medications with you for your next visit with your Primary MD  Please request your Primary MD to go over all hospital tests and procedure/radiological results at the follow up, please ask your Primary MD to get all Hospital records sent to his/her office.  If you experience worsening of your admission symptoms, develop shortness of breath, life threatening emergency, suicidal or homicidal thoughts you must seek medical attention immediately by calling 911 or calling your MD immediately  if symptoms less severe.  You must read complete  instructions/literature along with all the possible adverse reactions/side effects for all the Medicines you take and that have been prescribed to you. Take any new Medicines after you have completely understood and accpet all the possible adverse reactions/side effects.   Do not drive or operate heavy machinery when taking Pain medications.   Do not take more than prescribed Pain, Sleep and Anxiety Medications  Special Instructions: If you have smoked or chewed Tobacco  in the last 2 yrs please stop smoking, stop any regular Alcohol  and or any Recreational drug use.  Wear Seat belts while driving.  Please note You were cared for by a hospitalist during your hospital stay. If you have any questions about your discharge medications or the care you received while you were in the hospital after you are discharged, you can call the unit and asked to speak with the hospitalist on call if the hospitalist that took care of you is not available. Once you are discharged, your primary care physician will handle any further medical issues. Please note that NO REFILLS for any discharge medications will be authorized once you are discharged, as it is imperative that you return to your primary care physician (or establish a relationship with a primary care physician if you do not have one) for your aftercare needs so that they can reassess your need for medications and monitor your lab values.  You can reach the hospitalist office at phone [phone] or fax [phone]   If you do not have a primary care physician, you can call [phone] for a physician referral.

## 2023-03-30 NOTE — Progress Notes (Signed)
Progress Note  9 Days Post-Op  Subjective: Pt having issues with low blood sugar. Eating small portions and having bowel function. She is wanting to go home rather than back to rehab. Does not seem to remember working with PT/OT 2 days ago.   Objective: Vital signs in last 24 hours: Temp:  [97.6 F (36.4 C)-98.1 F (36.7 C)] 97.6 F (36.4 C) (12/04 0447) Pulse Rate:  [88-102] 88 (12/04 0447) Resp:  [16] 16 (12/04 0447) BP: (132-146)/(66-68) 146/67 (12/04 0447) SpO2:  [100 %] 100 % (12/04 0447) Weight:  [107.4 kg] 107.4 kg (12/03 1400) Last BM Date : 03/28/23  Intake/Output from previous day: 12/03 0701 - 12/04 0700 In: 1540.2 [I.V.:1540.2] Out: 1325 [Urine:1325] Intake/Output this shift: No intake/output data recorded.  PE: General: pleasant, WD, obese female who is laying in bed in NAD Lungs: Respiratory effort nonlabored Abd: soft, appropriately ttp, G tube clamped, JP SS, midline dressing CDI    Lab Results:  Recent Labs    03/29/23 0812 03/30/23 0437  WBC 9.7 10.7*  HGB 9.4* 8.9*  HCT 29.1* 27.7*  PLT 322 381   BMET Recent Labs    03/29/23 0812 03/30/23 0437  NA 137 136  K 3.4* 3.6  CL 111 109  CO2 20* 20*  GLUCOSE 88 72  BUN 13 9  CREATININE 0.77 0.84  CALCIUM 7.9* 7.7*   PT/INR No results for input(s): "LABPROT", "INR" in the last 72 hours. CMP     Component Value Date/Time   NA 136 03/30/2023 0437   K 3.6 03/30/2023 0437   CL 109 03/30/2023 0437   CO2 20 (L) 03/30/2023 0437   GLUCOSE 72 03/30/2023 0437   BUN 9 03/30/2023 0437   CREATININE 0.84 03/30/2023 0437   CREATININE 0.91 07/26/2017 1127   CALCIUM 7.7 (L) 03/30/2023 0437   PROT 4.7 (L) 03/27/2023 0242   ALBUMIN 1.7 (L) 03/27/2023 0242   AST 27 03/27/2023 0242   AST 15 07/26/2017 1127   ALT 22 03/27/2023 0242   ALT 14 07/26/2017 1127   ALKPHOS 164 (H) 03/27/2023 0242   BILITOT 0.7 03/27/2023 0242   BILITOT 0.5 07/26/2017 1127   GFRNONAA >60 03/30/2023 0437   GFRNONAA >60  07/26/2017 1127   GFRAA >60 02/28/2019 1022   GFRAA >60 07/26/2017 1127   Lipase     Component Value Date/Time   LIPASE 55 (H) 03/21/2023 0715       Studies/Results: No results found.  Anti-infectives: Anti-infectives (From admission, onward)    Start     Dose/Rate Route Frequency Ordered Stop   03/21/23 1330  piperacillin-tazobactam (ZOSYN) IVPB 3.375 g  Status:  Discontinued        3.375 g 12.5 mL/hr over 240 Minutes Intravenous Every 8 hours 03/21/23 1325 03/28/23 1021        Assessment/Plan  Perforated marginal ulcer POD#9 - status post ex lap, abdominal washout, Graham patch repair of perforated gastrojejunal ulcer, drain placement  Dr. Freida Busman - discussed with Dr. Dossie Der - low blood glucose may actually be related to dumping syndrome but ok to advance to regular diet today and monitor  - WBC normalized and afebrile - zosyn stopped 12/2 - BID PPI and carafate - H. Pylori pending  - drain with 40 cc out in last 24h - remove prior to discharge - midline clean - continue daily dressing changes - mobilize as able - stable for DC from surgery perspective once medically cleared   FEN: reg diet VTE: LMWH ID:  Zosyn 11/25>12/2  LOS: 9 days     Juliet Rude, [facility] 03/30/2023, 10:04 AM Please see Amion for pager number during day hours 7:00am-4:30pm

## 2023-03-30 NOTE — Plan of Care (Signed)

## 2023-03-30 NOTE — Hospital Course (Addendum)
Patient is a 72 year old female history of hypertension, hyperlipidemia, OSA, GERD, morbid obesity, 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  Patient found to have intra-abdominal free air and fluid and concern for possible GJ anastomosis ulceration resulting in perforation.  Patient also noted to have significant abdominal pain and vomiting for 3 days prior to admission.  Patient was seen by general surgery and underwent  exploratory laparotomy, abdominal washout, Graham patch repair of perforated gastrojejunal ulcer, intraperitoneal drain placement.  Patient subsequently admitted to the stepdown unit for closer monitoring since hypotensive and also in acute renal failure.  Patient was then transferred out of the stepdown unit.  Eventually transferred to floor.  Main issue prolonging hospitalization has been hypoglycemia which required initiation of tube feeds.  Hypoglycemia appears to be stabilizing and even resolving.  Surgery is adjusting tube feeds.  Plan is skilled nursing facility on discharge when okay with general surgery, likely soon.

## 2023-03-30 NOTE — Progress Notes (Signed)
Pt is A&O x 4. VSS, on room air. D5 1/2NS at 125 ml/hr infusing as ordered.  Blood sugar is 68- pt asymptomatic. 4oz orange juice given and tolerated well. Will repeat blood sugar per protocol.  Bed alarm on. Call bell in reach. Will continue to monitor.

## 2023-03-30 NOTE — Progress Notes (Signed)
Progress Note   Patient: Christine [name] HYQ:[id number] DOB: 07-11-1950 DOA: 03/21/2023     9 DOS: the patient was seen and examined on 03/30/2023   Brief hospital course: Patient is a 72 year old female history of hypertension, hyperlipidemia, OSA, GERD, morbid obesity, 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  Patient found to have intra-abdominal free air and fluid and concern for possible GJ anastomosis ulceration resulting in perforation.  Patient also noted to have significant abdominal pain and vomiting for 3 days prior to admission.  Patient was seen by general surgery and underwent  exploratory laparotomy, abdominal washout, Graham patch repair of perforated gastrojejunal ulcer, intraperitoneal drain placement.  Patient subsequently admitted to the stepdown unit for closer monitoring since hypotensive and also in acute renal failure.  Patient was then transferred out of the stepdown unit.  At this time general surgery is closely following the patient and has been advanced to bariatric advance diet which she has tolerated.  Patient however continues to have hypoglycemia initially on D10 infusion.  Plan is skilled nursing facility on discharge when okay with general surgery.   Assessment and Plan: Perforated marginal ulcer Likely secondary to G-tube displacement versus GJ anastomosis ulceration.   Status-post ex lap, abdominal washout, Graham patch repair of perforated gastrojejunal ulcer, intraperitoneal drain placement on 03/21/2023.   General surgery on board and has been advanced to bariatric advance diet and clear from surgical standpoint. Barrier to discharge is hypoglycemia requiring IVF  Hypoglycemia  Recurrent with low in 50s. Asymptomatic. D10 water for several days.   Strict I/O, no oral intake recorded yesterday. Will continue D5 half-normal saline today,  confer with surgery, see if she can have more sugar on the bariatric diet.  Metabolic encephalopathy -- resolved   Likely multifactorial from  surgery, IV narcotics and renal failure and hypoglycemia.     Hypotension -- resolved   AKI -- resolved Mild NAGMA Baseline creatinine around 0.7. Peaked at 2.11 this admission. Now back to baseline.   Hypokalemia -- resolved   Probable UTI -- resolved Urine culture with 60,000 colonies of E. coli resistant to multiple antibiotics except Zosyn.  Completed course of Zosyn during hospitalization.   Abnormal EKG with ST depression On admission.  No previous EKG. Admitting physician discussed with cardiology and recommended monitoring.  Patient did have tachycardia.  2D echocardiogram with LV function of 50 to 55% with no regional wall motion abnormality. May need outpatient follow-up with cardiology.   GERD Continue PPI.  Hypernatremia -- resolved   Hypomagnesemia -- resolved   Normocytic anemia Ferritin of 229, serum iron low at 11 TIBC low, folic acid normal at 19, vitamin B12 elevated at 1005. Hgb baseline 8-9 from May of this year Hgb stable, follow-up as an outpatient.   Bilateral medial aspect of leg tenderness   Has a chronic venous stasis.  Pitting edema noted with some tenderness, chronic per patient Will start oral Lasix, monitor BMP   Debility, deconditioning   PT OT recommended skilled nursing facility on discharge. Needs to be up to chair with meals, and mobilized multiple times per day.      Subjective:  Feels fine, no real pain Would like more sugar, frustrated with low blood sugar, thinks its from bariatric diet No hypoglycemia issues at home Otherwise doing well, but hasn't been out of bed lately, "only once" Was living alone prior to admission; willing to go  to SNF Plans to sell house and move to [place] to be with family  Physical Exam: Vitals:   03/29/23 1303 03/29/23 1400 03/29/23 2051 03/30/23 0447   BP: 132/68  134/66 (!) 146/67  Pulse: (!) 102  89 88  Resp:   16 16  Temp: 98 F (36.7 C)  98.1 F (36.7 C) 97.6 F (36.4 C)  TempSrc: Oral  Oral Oral  SpO2: 100%  100% 100%  Weight:  107.4 kg    Height:       Physical Exam Vitals reviewed.  Constitutional:      General: She is not in acute distress.    Appearance: She is not ill-appearing or toxic-appearing.  Cardiovascular:     Rate and Rhythm: Normal rate and regular rhythm.     Heart sounds: No murmur heard. Pulmonary:     Effort: Pulmonary effort is normal. No respiratory distress.     Breath sounds: No wheezing, rhonchi or rales.  Abdominal:     Palpations: Abdomen is soft.  Musculoskeletal:     Right lower leg: Edema present.     Left lower leg: Edema present.  Skin:    Comments: Chronic BLE venous stasis changes  Neurological:     Mental Status: She is alert.  Psychiatric:        Mood and Affect: Mood normal.        Behavior: Behavior normal.     Data Reviewed: CBG 50-90s Last evening 62 x2 This AM 51, repeat 74 BMP, Mg2+ noted WBC 10.7, stable Hgb stable at 8.9 Oral intake not recorded 12/3; 240 12/2.  Family Communication: none present or requested  Disposition: Status is: Inpatient Remains inpatient appropriate because: hypoglycemia requiring IVF     Time spent: 35 minutes  Author: Brendia Sacks, MD 03/30/2023 8:34 AM  For on call review [email or web address].

## 2023-03-30 NOTE — Progress Notes (Signed)
Physical Therapy Treatment Patient Details Name: Christine [name] MRN: [id number] DOB: Aug 10, 1950 Today's Date: 03/30/2023   History of Present Illness Patient is a 72 year old female admitted 03/21/23 with abdominal pain and vomiting. S/p ex lap, abdominal washout, Graham patch repair of perforated gastrojejunal ulcer, drain placement 03/21/2023. PMH includes HTN, hyperlipidemia, OSA, GERD, morbid obesity, esophageal dysmotility with gastric bypass May 2024 followed by failure to thrive with G-tube placement on 01/2023.    PT Comments  Pt was seen in bed at start of treatment. Pt was agreeable to participate in PT. Orthostatic BPs were taken throughout the session. Supine: BP 111/60, Seated: BP 123/70, Standing: BP 135/110. Pt performed supine to sit with min assist to scoot to EOB and bring trunk to upright position. Pt performed step pivot transfer to [facility] with RW and min assist. During transfer pt had flexed posture, unsteady steps, and required cues to keep walker close. Pt transferred back to bed via step pivot, pt required CGA. Pt felt dizzy upon standing so transfer was completed quickly. Pt felt better once seated on EOB. Pt required max assist to perform sit to supine. She needed assist to bring LEs onto bed and to slide towards HOB. Pt would benefit from continued PT to increase endurance, overall strength, ability to tolerate upright position, and ambulation. LPT recommends SNF.    If plan is discharge home, recommend the following: A lot of help with bathing/dressing/bathroom;Two people to help with walking and/or transfers;Assistance with cooking/housework;Assist for transportation;Help with stairs or ramp for entrance   Can travel by private vehicle     No  Equipment Recommendations  None recommended by PT    Recommendations for Other Services       Precautions / Restrictions Precautions Precautions: Fall Precaution Comments: abdominal sx, JP drain R, Restrictions Weight  Bearing Restrictions: No     Mobility  Bed Mobility Overal bed mobility: Needs Assistance Bed Mobility: Supine to Sit     Supine to sit: Min assist, Max assist     General bed mobility comments: Min assist for supine to sit, increased time, assistance to scoot to EOB. Max assist to perform sit to supine, assist to move LEs into bed and slide towards head of bed.    Transfers Overall transfer level: Needs assistance Equipment used: Rolling walker (2 wheels) Transfers: Sit to/from Stand, Bed to chair/wheelchair/BSC Sit to Stand: Min assist, +2 physical assistance, +2 safety/equipment   Step pivot transfers: +2 physical assistance, +2 safety/equipment, Contact guard assist       General transfer comment: Pt jolted weight forward to stand, pt had flexed posture, cues to keep walker close when performing step pivot.    Ambulation/Gait   Gait Distance (Feet): 4 Feet Assistive device: Rolling walker (2 wheels) Gait Pattern/deviations: Steppage, Trunk flexed, Wide base of support, Decreased step length - right, Decreased step length - left       General Gait Details: Only took steps when performing transfers, pt presents with a lateral sway, wide BOS, flexed trunk.   Stairs             Wheelchair Mobility     Tilt Bed    Modified Rankin (Stroke Patients Only)       Balance                                            Cognition Arousal: Alert  Behavior During Therapy: WFL for tasks assessed/performed Overall Cognitive Status: Within Functional Limits for tasks assessed                                 General Comments: oriented x4, fun lady, willing to participate in therapy.        Exercises      General Comments        Pertinent Vitals/Pain Pain Assessment Pain Assessment: No/denies pain Faces Pain Scale: Hurts little more Pain Location: stomach Pain Descriptors / Indicators: Grimacing Pain Intervention(s):  Monitored during session, Repositioned    Home Living                          Prior Function            PT Goals (current goals can now be found in the care plan section) Acute Rehab PT Goals Patient Stated Goal: to move back to be  with family PT Goal Formulation: With patient/family Time For Goal Achievement: 04/11/23 Progress towards PT goals: Progressing toward goals    Frequency    Min 1X/week      PT Plan      Co-evaluation              AM-PAC PT "6 Clicks" Mobility   Outcome Measure  Help needed turning from your back to your side while in a flat bed without using bedrails?: A Lot Help needed moving from lying on your back to sitting on the side of a flat bed without using bedrails?: A Lot Help needed moving to and from a bed to a chair (including a wheelchair)?: A Lot Help needed standing up from a chair using your arms (e.g., wheelchair or bedside chair)?: A Lot Help needed to walk in hospital room?: A Lot Help needed climbing 3-5 steps with a railing? : Total 6 Click Score: 11    End of Session Equipment Utilized During Treatment: Gait belt Activity Tolerance: Patient tolerated treatment well Patient left: in bed;with call bell/phone within reach Nurse Communication: Mobility status PT Visit Diagnosis: Unsteadiness on feet (R26.81);Difficulty in walking, not elsewhere classified (R26.2);Pain Pain - part of body:  (Stomach)     Time: 1455-1520 PT Time Calculation (min) (ACUTE ONLY): 25 min  Charges:    $Gait Training: 8-22 mins $Therapeutic Activity: 8-22 mins PT General Charges $$ ACUTE PT VISIT: 1 Visit                    Lazaro Arms, SPTA 03/30/2023, 3:37 PM

## 2023-03-31 DIAGNOSIS — E162 Hypoglycemia, unspecified: Secondary | ICD-10-CM | POA: Diagnosis not present

## 2023-03-31 DIAGNOSIS — K631 Perforation of intestine (nontraumatic): Secondary | ICD-10-CM | POA: Diagnosis not present

## 2023-03-31 LAB — GLUCOSE, CAPILLARY
Glucose-Capillary: 100 mg/dL — ABNORMAL HIGH (ref 70–99)
Glucose-Capillary: 102 mg/dL — ABNORMAL HIGH (ref 70–99)
Glucose-Capillary: 32 mg/dL — CL (ref 70–99)
Glucose-Capillary: 42 mg/dL — CL (ref 70–99)
Glucose-Capillary: 52 mg/dL — ABNORMAL LOW (ref 70–99)
Glucose-Capillary: 56 mg/dL — ABNORMAL LOW (ref 70–99)
Glucose-Capillary: 59 mg/dL — ABNORMAL LOW (ref 70–99)
Glucose-Capillary: 67 mg/dL — ABNORMAL LOW (ref 70–99)
Glucose-Capillary: 69 mg/dL — ABNORMAL LOW (ref 70–99)
Glucose-Capillary: 69 mg/dL — ABNORMAL LOW (ref 70–99)
Glucose-Capillary: 75 mg/dL (ref 70–99)
Glucose-Capillary: 76 mg/dL (ref 70–99)
Glucose-Capillary: 78 mg/dL (ref 70–99)
Glucose-Capillary: 79 mg/dL (ref 70–99)
Glucose-Capillary: 80 mg/dL (ref 70–99)
Glucose-Capillary: 83 mg/dL (ref 70–99)

## 2023-03-31 LAB — BASIC METABOLIC PANEL
Anion gap: 6 (ref 5–15)
BUN: 8 mg/dL (ref 8–23)
CO2: 20 mmol/L — ABNORMAL LOW (ref 22–32)
Calcium: 7.9 mg/dL — ABNORMAL LOW (ref 8.9–10.3)
Chloride: 109 mmol/L (ref 98–111)
Creatinine, Ser: 0.7 mg/dL (ref 0.44–1.00)
GFR, Estimated: >60 mL/min (ref >60)
Glucose, Bld: 92 mg/dL (ref 70–99)
Potassium: 3.3 mmol/L — ABNORMAL LOW (ref 3.5–5.1)
Sodium: 135 mmol/L (ref 135–145)

## 2023-03-31 LAB — PHOSPHORUS: Phosphorus: 2.8 mg/dL (ref 2.5–4.6)

## 2023-03-31 LAB — GLUCOSE, RANDOM: Glucose, Bld: 102 mg/dL — ABNORMAL HIGH (ref 70–99)

## 2023-03-31 LAB — MAGNESIUM: Magnesium: 1.8 mg/dL (ref 1.7–2.4)

## 2023-03-31 LAB — H. PYLORI ANTIGEN, STOOL: H. Pylori Stool Ag, Eia: NEGATIVE

## 2023-03-31 MED ORDER — DEXTROSE 50 % IV SOLN
12.5000 g | INTRAVENOUS | Status: AC
  Administered 2023-03-31: 12.5 g via INTRAVENOUS
  Filled 2023-03-31: qty 50

## 2023-03-31 MED ORDER — DEXTROSE 50 % IV SOLN
25.0000 g | INTRAVENOUS | Status: AC
  Administered 2023-03-31: 25 g via INTRAVENOUS
  Filled 2023-03-31: qty 50

## 2023-03-31 MED ORDER — DEXTROSE 50 % IV SOLN
INTRAVENOUS | Status: AC
  Administered 2023-03-31: 25 g via INTRAVENOUS
  Filled 2023-03-31: qty 50

## 2023-03-31 MED ORDER — DEXTROSE 50 % IV SOLN
25.0000 mL | Freq: Once | INTRAVENOUS | Status: AC
  Administered 2023-03-31: 25 mL via INTRAVENOUS
  Filled 2023-03-31: qty 50

## 2023-03-31 MED ORDER — DEXTROSE 50 % IV SOLN: 25.0000 g | INTRAVENOUS | Status: AC

## 2023-03-31 MED ORDER — GLUCERNA SHAKE PO LIQD
237.0000 mL | Freq: Two times a day (BID) | ORAL | Status: DC
  Administered 2023-04-01 – 2023-04-04 (×6): 237 mL via ORAL
  Administered 2023-04-05: 100 mL via ORAL
  Filled 2023-03-31 (×10): qty 237

## 2023-03-31 MED ORDER — POTASSIUM CHLORIDE 10 MEQ/100ML IV SOLN
10.0000 meq | INTRAVENOUS | Status: AC
  Administered 2023-03-31 (×4): 10 meq via INTRAVENOUS
  Filled 2023-03-31 (×4): qty 100

## 2023-03-31 MED ORDER — JEVITY 1.2 CAL PO LIQD
720.0000 mL | ORAL | Status: DC
  Administered 2023-03-31 – 2023-04-04 (×6): 720 mL
  Filled 2023-03-31 (×6): qty 948

## 2023-03-31 NOTE — Progress Notes (Signed)
Latest Reference Range & Units 03/31/23 02:46 03/31/23 03:59 03/31/23 05:54 03/31/23 06:30  Glucose-Capillary 70 - 99 mg/dL 536 (H) 76 69 (L) 69 (L)  (H): Data is abnormally high (L): Data is abnormally low  Blood sugars has been low despite hypoglycemia protocol. Pt asymptomatic. Pt verbalizing that she ate about 1/2 sandwich and couldn't tolerate sandwich and crackers and feels full very fast. Anthoney Harada, NP notified via secure chat and updated pt condition. D50 given as ordered- see emar.   Will repeat CBG as per protocol.

## 2023-03-31 NOTE — Evaluation (Signed)
Clinical/Bedside Swallow Evaluation Patient Details  Name: Christine [name] MRN: [id number] Date of Birth: May 09, 1950  Today's Date: 03/31/2023 Time: SLP Start Time (ACUTE ONLY): 1254 SLP Stop Time (ACUTE ONLY): 1304 SLP Time Calculation (min) (ACUTE ONLY): 10 min  Past Medical History:  Past Medical History:  Diagnosis Date   Allergy    dust, pollen, sulfa, prednisone   Anemia    Anxiety    Arthritis    "knees" (04/26/2016)   Colon polyps    benign per pt   Coronary artery disease    mild per 2015 cath in [place] (OM1 30%, RCA 30%)   GERD (gastroesophageal reflux disease)    Gout    History of hiatal hernia    Hyperlipidemia 05/20/2021   Hypertension    Migraine    "none since early /2017" (05/06/2016)   Obesity    OSA on CPAP    uses CPAP   Pre-diabetes    Pre-operative cardiovascular examination 08/22/2008   Renal insufficiency 11/09/2022   Vasculitis (HCC)    Bilateral   Past Surgical History:  Past Surgical History:  Procedure Laterality Date   ABDOMINAL HYSTERECTOMY     "partial"; both ovaries present   APPENDECTOMY  05/06/2016   BIOPSY  04/08/2022   Procedure: BIOPSY;  Surgeon: Sherrilyn Rist, MD;  Location: [facility];  Service: Gastroenterology;;   CARDIAC CATHETERIZATION  ~ 2015   CARDIAC CATHETERIZATION  2015   In [place]   CHILECTOMY Right 06/01/2017   Procedure: CHILECTOMY RIGHT FOOT;  Surgeon: Felecia Shelling, DPM;  Location: [facility];  Service: Podiatry;  Laterality: Right;   CHOLECYSTECTOMY N/A 01/25/2023   Procedure: LAPAROSCOPIC CHOLECYSTECTOMY WITH INTRAOPERATIVE CHOLANGIOGRAM;  Surgeon: Quentin Ore, MD;  Location: [facility];  Service: General;  Laterality: N/A;   ESOPHAGOGASTRODUODENOSCOPY N/A 01/25/2023   Procedure: ESOPHAGOGASTRODUODENOSCOPY (EGD);  Surgeon: Quentin Ore, MD;  Location: [facility];  Service: General;  Laterality: N/A;   ESOPHAGOGASTRODUODENOSCOPY (EGD) WITH PROPOFOL N/A 04/08/2022   Procedure: ESOPHAGOGASTRODUODENOSCOPY  (EGD) WITH PROPOFOL;  Surgeon: Sherrilyn Rist, MD;  Location: [facility];  Service: Gastroenterology;  Laterality: N/A;   IR GASTROSTOMY TUBE REMOVAL  03/21/2023   LAPAROSCOPIC APPENDECTOMY N/A 05/06/2016   Procedure: LAPAROSCOPIC APPENDECTOMY;  Surgeon: Manus Rudd, MD;  Location: [facility];  Service: General;  Laterality: N/A;   LAPAROSCOPIC INSERTION GASTROSTOMY TUBE N/A 01/25/2023   Procedure: LAPAROSCOPIC INSERTION REMNANT GASTROSTOMY TUBE;  Surgeon: Quentin Ore, MD;  Location: [facility];  Service: General;  Laterality: N/A;   LAPAROTOMY N/A 03/21/2023   Procedure: EXPLORATORY LAPAROTOMY, PATCH REPAIR OF GASTRIC ULCER;  Surgeon: Fritzi Mandes, MD;  Location: [facility];  Service: General;  Laterality: N/A;   POLYPECTOMY  04/08/2022   Procedure: POLYPECTOMY;  Surgeon: Sherrilyn Rist, MD;  Location: [facility];  Service: Gastroenterology;;   TONSILLECTOMY     HPI:  Patient is a 72 year old female admitted 03/21/23 with abdominal pain and vomiting. S/p ex lap, abdominal washout, Graham patch repair of perforated gastrojejunal ulcer, drain placement 03/21/2023. PMH includes HTN, hyperlipidemia, OSA, GERD, morbid obesity, esophageal dysmotility with gastric bypass May 2024 followed by failure to thrive with G-tube placement on 01/2023. BSE 01/30/23 no s/s aspiration, decreased appetite, regular/thin recommended.    Assessment / Plan / Recommendation  Clinical Impression  RN and pt both reported no difficulty with swallowing. RN had relayed to MD that her blood sugar was low this morning. Pt said her G-tube was removed several weeks ago but that she  has been eating po's for 2-3 months. RN stated she took her pills without difficulty this morning but that her appetite hasn't been great. Pt states sometimes she takes her pills with Ensure and once had difficulty with larger pill and took with pudding without difficulty. She had upper dentures and natural lower dentition. Pt exhibits a strong  volitional cough and normal oromotor abilities. She consumed consecutive straw sips thin water without s/s aspiration throughout evaluation. Mastication with graham cracker was a little slow and pt stated her mouth was dry and used liquid wash to facilitate oral transit. Recommend she continue regular texture, thin liquids, pills with thin liquid. Encourage her to order foods she prefers to eat. No further ST needed. SLP Visit Diagnosis: Dysphagia, unspecified (R13.10)    Aspiration Risk  No limitations    Diet Recommendation Regular;Thin liquid    Liquid Administration via: Straw;Cup Medication Administration: Whole meds with liquid Supervision: Patient able to self feed Compensations: Slow rate;Small sips/bites Postural Changes: Remain upright for at least 30 minutes after po intake;Seated upright at 90 degrees    Other  Recommendations Oral Care Recommendations: Oral care BID    Recommendations for follow up therapy are one component of a multi-disciplinary discharge planning process, led by the attending physician.  Recommendations may be updated based on patient status, additional functional criteria and insurance authorization.  Follow up Recommendations No SLP follow up      Assistance Recommended at Discharge    Functional Status Assessment Patient has not had a recent decline in their functional status  Frequency and Duration            Prognosis        Swallow Study   General Date of Onset: 03/31/23 HPI: Patient is a 72 year old female admitted 03/21/23 with abdominal pain and vomiting. S/p ex lap, abdominal washout, Graham patch repair of perforated gastrojejunal ulcer, drain placement 03/21/2023. PMH includes HTN, hyperlipidemia, OSA, GERD, morbid obesity, esophageal dysmotility with gastric bypass May 2024 followed by failure to thrive with G-tube placement on 01/2023. BSE 01/30/23 no s/s aspiration, decreased appetite, regular/thin recommended. Type of Study: Bedside  Swallow Evaluation Previous Swallow Assessment:  (see HPI) Diet Prior to this Study: Regular;Thin liquids (Level 0) Temperature Spikes Noted: No Respiratory Status: Room air History of Recent Intubation: No Behavior/Cognition: Pleasant mood;Cooperative;Alert Oral Cavity Assessment: Within Functional Limits Oral Care Completed by SLP: No Oral Cavity - Dentition: Dentures, top;Other (Comment) (natural lower) Vision: Functional for self-feeding Self-Feeding Abilities: Able to feed self Patient Positioning: Upright in bed Baseline Vocal Quality: Normal Volitional Cough: Strong Volitional Swallow: Able to elicit    Oral/Motor/Sensory Function Overall Oral Motor/Sensory Function: Within functional limits   Ice Chips Ice chips: Not tested   Thin Liquid Thin Liquid: Within functional limits Presentation: Straw    Nectar Thick Nectar Thick Liquid: Not tested   Honey Thick Honey Thick Liquid: Not tested   Puree Puree: Within functional limits   Solid     Solid: Within functional limits      Roque Cash, Breck Coons 03/31/2023,1:25 PM

## 2023-03-31 NOTE — Plan of Care (Signed)

## 2023-03-31 NOTE — Progress Notes (Signed)
Nutrition Follow-up  DOCUMENTATION CODES:   Morbid obesity  INTERVENTION:  - Initiate tube feeding via Gastrostomy: Jevity 1.2 at 30 ml/h (720 ml per day) *Start at 39mL/hr and advance by 10mL Q8H to goal of 76mL/hr Provides 864 kcal, 40 gm protein, 581 ml free water daily  - Recommend discontinuing D5 now that starting tube feeds.  - Monitor magnesium, potassium, and phosphorus BID for at least 3 days, MD to replete as needed, as pt is at risk for refeeding syndrome given in adequate nutrition since admit.   - Regular diet per Surgery.  - Add Glucerna Shake po BID, each supplement provides 220 kcal and 10 grams of protein  - Continue daily Multivitamin with minerals daily, vitamin D, vitamin C, and folic acid supplementation.    - Patient also on zinc supplementation. Will check zinc level to determine need for continued supplementation. Will also check copper as unsure how long patient has been on zinc supplementation.   - Monitor weight trends.   NUTRITION DIAGNOSIS:   Altered nutrition lab value related to chronic illness (gastric bypass with dumping syndrome) as evidenced by other (comment) (hypoglycemia with need to start tube feeds).  GOAL:   Patient will meet greater than or equal to 90% of their needs  MONITOR:   PO intake, Supplement acceptance, Labs, Weight trends, TF tolerance  REASON FOR ASSESSMENT:   Consult Enteral/tube feeding initiation and management  ASSESSMENT:   72 year old female PMH of HTN, HLD, OSA, GERD, morbid obesity, esophageal dysmotility with gastric bypass May 2024 followed by failure to thrive with G-tube placement on 01/2023 who presented to the hospital with abdominal pain. Admitted for perforated marginal ulcer.   11/25 Admit; s/p ex-lap, abdominal washout, Graham patch repair of perforated gastrojejunal ulcer  11/30 Bariatric clear liquid diet 12/1 Bariatric full liquid diet 12/2 Bariatric advanced diet 12/4 Regular diet  Patient  endorses a UBW of 360# that she last weighed about 3 years prior to her gastric bypass in May of this year. She reports losing 60# over the course of those 3 years for surgery. Since her surgery in May, she has been slowly losing weight.  Per EMR, weight at time of gastric bypass in May was 289# and she has been steadily losing weight since around June. Weight this admission at 232#.   She reports she was not doing well in September with intake during a hospitalization so she had a Gastrostomy tube placed on 10/1. However, she reports not using the tube since at least 1 month prior to this admission. Instead, endorses eating well PTA. States she is more of a grazer and was eating small and frequent meals. Endorses taking a bariatric multivitamin PTA. Per home meds, she also reported taking vitamin C, vitamin D, folic acid, thiamine, and zinc (which have all been ordered this admission).   Since diet was advanced she reports ordering 3 meals and just snacking on them over the course of an hour or two. Gets snacks like muffins or a banana to have available in between meals. She is agreeable to receive Glucerna to support intake.   Patient has been experiencing hypoglycemia the past few days, which has hindered her discharge. Surgery's note indicates it is likely due to dumping syndrome.   Per discussion with Surgery PA-C, plan to start tube feeds via gastrostomy tube that is in the excluded stomach to help with hypoglycemia. Plan to meet ~50% of needs.  Discussed with patient who is on board with plan. Discussed  plan with RN.   Medications reviewed and include: 500mg  vitamin C, 5000 units vitamin D, 1mg  folic acid, MVI, 220mg  zinc, D5 @ 150mL/hr (provides 510 kcals over 24 hours)  Labs reviewed:  K+ 3.3 HA1C 6.3 Blood Glucose 69-117 x24 hours   NUTRITION - FOCUSED PHYSICAL EXAM:  Flowsheet Row Most Recent Value  Orbital Region No depletion  Upper Arm Region No depletion  Thoracic and  Lumbar Region No depletion  Buccal Region No depletion  Temple Region No depletion  Clavicle Bone Region No depletion  Clavicle and Acromion Bone Region No depletion  Scapular Bone Region Unable to assess  Dorsal Hand No depletion  Patellar Region No depletion  Anterior Thigh Region No depletion  Posterior Calf Region No depletion  Edema (RD Assessment) Mild  Hair Other (Comment)  [shaved]  Eyes Reviewed  Mouth Reviewed  Skin Reviewed  Nails Reviewed       Diet Order:   Diet Order             Diet regular Room service appropriate? Yes; Fluid consistency: Thin  Diet effective now                   EDUCATION NEEDS:  Education needs have been addressed  Skin:  Skin Assessment: Reviewed RN Assessment  Last BM:  70/5 - type 6  Height:  Ht Readings from Last 1 Encounters:  03/23/23 5\' 1"  (1.549 m)   Weight:  Wt Readings from Last 1 Encounters:  03/29/23 107.4 kg   Ideal Body Weight:  47.73 kg  BMI:  Body mass index is 44.74 kg/m.  Estimated Nutritional Needs:  Kcal:  1500-1800 kcals Protein:  80-105 grams Fluid:  >/= 1.5L    Shelle Iron RD, LDN Contact via Secure Chat.

## 2023-03-31 NOTE — Progress Notes (Signed)
Repeat CBG 80- pt asymptomatic

## 2023-03-31 NOTE — Progress Notes (Signed)
Progress Note   Patient: Christine [name] ZOX:[id number] DOB: Feb 07, 1951 DOA: 03/21/2023     10 DOS: the patient was seen and examined on 03/31/2023   Brief hospital course: Patient is a 72 year old female history of hypertension, hyperlipidemia, OSA, GERD, morbid obesity, 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  Patient found to have intra-abdominal free air and fluid and concern for possible GJ anastomosis ulceration resulting in perforation.  Patient also noted to have significant abdominal pain and vomiting for 3 days prior to admission.  Patient was seen by general surgery and underwent  exploratory laparotomy, abdominal washout, Graham patch repair of perforated gastrojejunal ulcer, intraperitoneal drain placement.  Patient subsequently admitted to the stepdown unit for closer monitoring since hypotensive and also in acute renal failure.  Patient was then transferred out of the stepdown unit.  At this time general surgery is closely following the patient and has been advanced to bariatric advance diet which she has tolerated.  Patient however continues to have hypoglycemia initially on D10 infusion.  Plan is skilled nursing facility on discharge when okay with general surgery.   Assessment and Plan: Perforated marginal ulcer Likely secondary to G-tube displacement versus GJ anastomosis ulceration.   Status-post ex lap, abdominal washout, Graham patch repair of perforated gastrojejunal ulcer, intraperitoneal drain placement on 03/21/2023.   Continues to have difficulty with hypoglycemia symptoms with general surgery start tube feeds. Barrier to discharge is hypoglycemia r  Hypokalemia K+ 3.3 > replete, check Mg2+ in am   Hypoglycemia  Recurrent with low in 60s. Asymptomatic. Strict I/O, no oral intake recorded yesterday. Appreciate surgical intervention, starting tube  feeds.   Metabolic encephalopathy -- resolved   Likely multifactorial from  surgery, IV narcotics and renal failure and hypoglycemia.     Hypotension -- resolved   AKI -- resolved Mild NAGMA Baseline creatinine around 0.7. Peaked at 2.11 this admission. Now back to baseline.   Hypokalemia -- resolved   Probable UTI -- resolved Urine culture with 60,000 colonies of E. coli resistant to multiple antibiotics except Zosyn.  Completed course of Zosyn during hospitalization.   Abnormal EKG with ST depression On admission.  No previous EKG. Admitting physician discussed with cardiology and recommended monitoring.  Patient did have tachycardia.  2D echocardiogram with LV function of 50 to 55% with no regional wall motion abnormality. May need outpatient follow-up with cardiology.   GERD Continue PPI.   Hypernatremia -- resolved   Hypomagnesemia -- resolved   Normocytic anemia Ferritin of 229, serum iron low at 11 TIBC low, folic acid normal at 19, vitamin B12 elevated at 1005. Hgb baseline 8-9 from May of this year Hgb stable, follow-up as an outpatient.   Bilateral medial aspect of leg tenderness   Has a chronic venous stasis.  Pitting edema noted with some tenderness, chronic per patient Oral Lasix for total 3 days.   Debility, deconditioning   PT OT recommended skilled nursing facility on discharge. Needs to be up to chair with meals, and mobilized multiple times per day.  pt has been in SNF at [facility] for a few months and sister fully intends for pt to return there.     Subjective:  Interval notes reviewed. RR called. Hypoglycemia noted.  Pt feels ok today, eating some.  Physical Exam: Vitals:   03/30/23 1936 03/31/23 0414 03/31/23 0544 03/31/23 0548  BP: 120/68 94/64 (!) 134/94 136/63  Pulse:  78 85 (!) 102   Resp: 16 18 16    Temp: (!) 96.7 F (35.9 C) (!) 97.5 F (36.4 C) 97.6 F (36.4 C)   TempSrc: Axillary     SpO2: 100% 100% 100%   Weight:       Height:       Physical Exam Vitals reviewed.  Constitutional:      General: She is not in acute distress.    Appearance: She is not ill-appearing or toxic-appearing.  Cardiovascular:     Rate and Rhythm: Normal rate and regular rhythm.     Heart sounds: No murmur heard. Pulmonary:     Effort: Pulmonary effort is normal. No respiratory distress.     Breath sounds: No wheezing, rhonchi or rales.  Neurological:     Mental Status: She is alert.  Psychiatric:        Mood and Affect: Mood normal.        Behavior: Behavior normal.     Data Reviewed: CBG low to 60s K+ 3.3 > replete, check Mg2+ in am  Family Communication: none present or requested  Disposition: Status is: Inpatient Remains inpatient appropriate because: hypoglycemia     Time spent: 20 minutes  Author: Brendia Sacks, MD 03/31/2023 8:04 AM  For on call review [email or web address].

## 2023-03-31 NOTE — Progress Notes (Signed)
Repeat CBG 100. Pt asymptomatic.  Pt did not void during this shift and on continuous fluids at 125 ml/hr. Pt has no urge and per pt- not sure when she voided.  Bladder scan showed 315 ml. Anthoney Harada, NP notified.  Per NP will repeat bladder scan in 1-2hrs

## 2023-03-31 NOTE — Progress Notes (Signed)
    Patient Name: Christine [name]           DOB: May 26, 1950  MRN: [id number]      Admission Date: 03/21/2023  Attending Provider: Standley Brooking, MD  Primary Diagnosis: Bowel perforation [facility])   Level of care: Med-Surg    CROSS COVER NOTE   Date of Service   03/31/2023   [name], 72 y.o. female, was admitted on 03/21/2023 for Bowel perforation [facility]).    HPI/Events of Note   Hypoglycemia, asymptomatic Hypoglycemic episodes are recurrent and asymptomatic.  Patient was on D10 for several days and was transitioned to D5 half-normal saline with regular diet. At midnight, CBG 50s.  Even after juice and half amp D50, CBG< 70.  Patient was refusing further oral intake. RN was instructed to administer 1 full amp of D50. Random serum glucose collected to verify.  Serum Glucose now 102, this matches with CBG collected.   Addendum: 0359- CBG 76, patient asymptomatic.  Patient is alert and oriented x 4 and is requesting food.   Nursing staff has been encouraged to provide food and juice to patient and reassess CBG.    Interventions/ Plan   D50 Random glucose Encourage oral intake        Anthoney Harada, DNP, [employer] [facility]

## 2023-03-31 NOTE — Progress Notes (Signed)
   03/31/23 0414  Vitals  Temp (!) 97.5 F (36.4 C)  BP 94/64  MAP (mmHg) 72  BP Location Left Arm  BP Method Automatic  Patient Position (if appropriate) Lying  Pulse Rate 85  Pulse Rate Source Monitor  Resp 18  MEWS COLOR  MEWS Score Color Green  Oxygen Therapy  SpO2 100 %  O2 Device Room Air  MEWS Score  MEWS Temp 0  MEWS Systolic 1  MEWS Pulse 0  MEWS RR 0  MEWS LOC 0  MEWS Score 1    CBG 76, (CBG was 102 at 0249). BP low., pt asymptomatic. A&O x4. Pt did not void during this shift. Repeat bladder scan 415. Anthoney Harada, NP notified. Rapid RN notified. Both came at bedside.  Per NP order, IN & OUT cath done and 400 ml amber color output noted.  Sandwich and mild given to patient and encourage to eat. Will repeat CBG in 1 hr per order.

## 2023-03-31 NOTE — Progress Notes (Signed)
Progress Note  10 Days Post-Op  Subjective: Pt continues to have issues with hypoglycemia despite regular diet yesterday. Per RN overnight patient was hypoglycemic and then not able to tolerate PO intake - when RN and I clarified with patient in the room she explained that she just did not want juice when it was offered overnight. Per documentation she had told nursing staff that she was hungry just prior to this. Per PT notes she had a lot of dizziness with mobilization. Patient reports fainting yesterday although I do not see documentation of this.   Objective: Vital signs in last 24 hours: Temp:  [96.7 F (35.9 C)-97.6 F (36.4 C)] 97.6 F (36.4 C) (12/05 0544) Pulse Rate:  [78-104] 102 (12/05 0544) Resp:  [16-18] 16 (12/05 0544) BP: (94-136)/(63-94) 136/63 (12/05 0548) SpO2:  [97 %-100 %] 100 % (12/05 0544) Last BM Date : 03/30/23  Intake/Output from previous day: 12/04 0701 - 12/05 0700 In: 1372.2 [P.O.:118; I.V.:1254.2] Out: 420 [Urine:400; Drains:20] Intake/Output this shift: No intake/output data recorded.  PE: General: pleasant, WD, obese female who is laying in bed in NAD Lungs: Respiratory effort nonlabored Abd: soft, appropriately ttp, G tube clamped (remains in excluded stomach), JP SS (removed by myself without complication), midline wound clean     Lab Results:  Recent Labs    03/29/23 0812 03/30/23 0437  WBC 9.7 10.7*  HGB 9.4* 8.9*  HCT 29.1* 27.7*  PLT 322 381   BMET Recent Labs    03/30/23 0437 03/31/23 0243 03/31/23 0452  NA 136  --  135  K 3.6  --  3.3*  CL 109  --  109  CO2 20*  --  20*  GLUCOSE 72 102* 92  BUN 9  --  8  CREATININE 0.84  --  0.70  CALCIUM 7.7*  --  7.9*   PT/INR No results for input(s): "LABPROT", "INR" in the last 72 hours. CMP     Component Value Date/Time   NA 135 03/31/2023 0452   K 3.3 (L) 03/31/2023 0452   CL 109 03/31/2023 0452   CO2 20 (L) 03/31/2023 0452   GLUCOSE 92 03/31/2023 0452   BUN 8  03/31/2023 0452   CREATININE 0.70 03/31/2023 0452   CREATININE 0.91 07/26/2017 1127   CALCIUM 7.9 (L) 03/31/2023 0452   PROT 4.7 (L) 03/27/2023 0242   ALBUMIN 1.7 (L) 03/27/2023 0242   AST 27 03/27/2023 0242   AST 15 07/26/2017 1127   ALT 22 03/27/2023 0242   ALT 14 07/26/2017 1127   ALKPHOS 164 (H) 03/27/2023 0242   BILITOT 0.7 03/27/2023 0242   BILITOT 0.5 07/26/2017 1127   GFRNONAA >60 03/31/2023 0452   GFRNONAA >60 07/26/2017 1127   GFRAA >60 02/28/2019 1022   GFRAA >60 07/26/2017 1127   Lipase     Component Value Date/Time   LIPASE 55 (H) 03/21/2023 0715       Studies/Results: No results found.  Anti-infectives: Anti-infectives (From admission, onward)    Start     Dose/Rate Route Frequency Ordered Stop   03/21/23 1330  piperacillin-tazobactam (ZOSYN) IVPB 3.375 g  Status:  Discontinued        3.375 g 12.5 mL/hr over 240 Minutes Intravenous Every 8 hours 03/21/23 1325 03/28/23 1021        Assessment/Plan  Perforated marginal ulcer POD#10 - status post ex lap, abdominal washout, Graham patch repair of perforated gastrojejunal ulcer, drain placement  Dr. Freida Busman - WBC normalized and afebrile - zosyn stopped  12/2 - BID PPI and carafate - H. 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

## 2023-03-31 NOTE — Progress Notes (Signed)
Latest Reference Range & Units 03/30/23 19:43 03/30/23 20:05 03/30/23 21:54 03/31/23 00:06 03/31/23 00:23 03/31/23 01:03  Glucose-Capillary 70 - 99 mg/dL 68 (L) 78 88 52 (L) 56 (L) 75  (L): Data is abnormally low  Blood sugars has been low and pt asymptomatic. Anthoney Harada, NP notified via secure chat and updated CBG readings.  Hypoglycemia protocol followed. Also, additional one time dose D50 50g given as ordered at 0115. See emar.  Will continue to monitor.

## 2023-03-31 NOTE — Progress Notes (Signed)
Received call for rapid response due to hypoglycemia and urinary retention. Reviewed chart and when arrived provider at bedside. Patient alert/oriented and verbalizing hunger. Patient is asymptomatic for hypoglycemia which has been a recurrent issue this admission. Patient has regular diet and no known issues with swallowing. Patient on D51/2NS IVF. Requested float secretary find patient a sandwich and milk. Encouraged consuming protein to help stabilize blood sugar. Bedside RN to recheck CBG after eating and recheck bladder scan to see if parameters met for IN and OUT catheterization. BMP added on to previously drawn sample.

## 2023-04-01 DIAGNOSIS — E162 Hypoglycemia, unspecified: Secondary | ICD-10-CM | POA: Diagnosis not present

## 2023-04-01 DIAGNOSIS — K631 Perforation of intestine (nontraumatic): Secondary | ICD-10-CM | POA: Diagnosis not present

## 2023-04-01 LAB — BASIC METABOLIC PANEL
Anion gap: 7 (ref 5–15)
BUN: 6 mg/dL — ABNORMAL LOW (ref 8–23)
CO2: 18 mmol/L — ABNORMAL LOW (ref 22–32)
Calcium: 7.8 mg/dL — ABNORMAL LOW (ref 8.9–10.3)
Chloride: 109 mmol/L (ref 98–111)
Creatinine, Ser: 0.59 mg/dL (ref 0.44–1.00)
GFR, Estimated: >60 mL/min (ref >60)
Glucose, Bld: 156 mg/dL — ABNORMAL HIGH (ref 70–99)
Potassium: 4 mmol/L (ref 3.5–5.1)
Sodium: 134 mmol/L — ABNORMAL LOW (ref 135–145)

## 2023-04-01 LAB — GLUCOSE, CAPILLARY
Glucose-Capillary: 122 mg/dL — ABNORMAL HIGH (ref 70–99)
Glucose-Capillary: 43 mg/dL — CL (ref 70–99)
Glucose-Capillary: 53 mg/dL — ABNORMAL LOW (ref 70–99)
Glucose-Capillary: 62 mg/dL — ABNORMAL LOW (ref 70–99)
Glucose-Capillary: 71 mg/dL (ref 70–99)
Glucose-Capillary: 71 mg/dL (ref 70–99)
Glucose-Capillary: 74 mg/dL (ref 70–99)
Glucose-Capillary: 74 mg/dL (ref 70–99)
Glucose-Capillary: 77 mg/dL (ref 70–99)
Glucose-Capillary: 77 mg/dL (ref 70–99)
Glucose-Capillary: 78 mg/dL (ref 70–99)
Glucose-Capillary: 89 mg/dL (ref 70–99)

## 2023-04-01 LAB — PHOSPHORUS
Phosphorus: 2.8 mg/dL (ref 2.5–4.6)
Phosphorus: 2.6 mg/dL (ref 2.5–4.6)

## 2023-04-01 LAB — MAGNESIUM
Magnesium: 1.8 mg/dL (ref 1.7–2.4)
Magnesium: 1.7 mg/dL (ref 1.7–2.4)

## 2023-04-01 MED ORDER — DEXTROSE 50 % IV SOLN
50.0000 mL | Freq: Once | INTRAVENOUS | Status: DC
  Filled 2023-04-01: qty 50

## 2023-04-01 MED ORDER — ADULT MULTIVITAMIN W/MINERALS CH: 1.0000 | ORAL_TABLET | Freq: Every day | ORAL | Status: DC

## 2023-04-01 MED ORDER — ZINC SULFATE 220 (50 ZN) MG PO CAPS
220.0000 mg | ORAL_CAPSULE | Freq: Every day | ORAL | Status: DC
  Administered 2023-04-02 – 2023-04-04 (×3): 220 mg
  Filled 2023-04-01 (×4): qty 1

## 2023-04-01 MED ORDER — DEXTROSE 50 % IV SOLN
12.5000 g | INTRAVENOUS | Status: AC
  Administered 2023-04-01: 12.5 g via INTRAVENOUS

## 2023-04-01 MED ORDER — DEXTROSE 10 % IV SOLN: INTRAVENOUS | Status: DC

## 2023-04-01 MED ORDER — BACITRACIN 500 UNIT/GM EX OINT
TOPICAL_OINTMENT | Freq: Two times a day (BID) | CUTANEOUS | Status: DC
  Administered 2023-04-01 – 2023-04-02 (×4): 1 via TOPICAL
  Filled 2023-04-01: qty 0.9
  Filled 2023-04-01 (×2): qty 14
  Filled 2023-04-01: qty 0.9
  Filled 2023-04-01: qty 14
  Filled 2023-04-01: qty 0.9

## 2023-04-01 MED ORDER — FOLIC ACID 1 MG PO TABS
1.0000 mg | ORAL_TABLET | Freq: Every day | ORAL | Status: DC
  Administered 2023-04-02 – 2023-04-05 (×4): 1 mg
  Filled 2023-04-01 (×4): qty 1

## 2023-04-01 MED ORDER — ADULT MULTIVITAMIN W/MINERALS CH
1.0000 | ORAL_TABLET | Freq: Every day | ORAL | Status: DC
Start: 2023-04-02 — End: 2023-04-06
  Administered 2023-04-02 – 2023-04-05 (×4): 1
  Filled 2023-04-01 (×4): qty 1

## 2023-04-01 MED ORDER — ZINC SULFATE 220 (50 ZN) MG PO CAPS: 220.0000 mg | ORAL_CAPSULE | Freq: Every day | ORAL | Status: DC

## 2023-04-01 MED ORDER — VITAMIN C 500 MG PO TABS
500.0000 mg | ORAL_TABLET | Freq: Every day | ORAL | Status: DC
  Administered 2023-04-02 – 2023-04-05 (×4): 500 mg
  Filled 2023-04-01 (×4): qty 1

## 2023-04-01 MED ORDER — ACETAMINOPHEN 325 MG PO TABS
650.0000 mg | ORAL_TABLET | Freq: Four times a day (QID) | ORAL | Status: DC | PRN
  Administered 2023-04-01: 650 mg via ORAL
  Filled 2023-04-01: qty 2

## 2023-04-01 MED ORDER — VITAMIN D 25 MCG (1000 UNIT) PO TABS: 5000.0000 [IU] | ORAL_TABLET | Freq: Every day | ORAL | Status: DC

## 2023-04-01 MED ORDER — GABAPENTIN 250 MG/5ML PO SOLN
100.0000 mg | Freq: Three times a day (TID) | ORAL | Status: DC
  Administered 2023-04-02 – 2023-04-06 (×13): 100 mg
  Filled 2023-04-01 (×19): qty 2

## 2023-04-01 MED ORDER — VITAMIN C 500 MG PO TABS: 500.0000 mg | ORAL_TABLET | Freq: Every day | ORAL | Status: DC

## 2023-04-01 MED ORDER — SUCRALFATE 1 GM/10ML PO SUSP
1.0000 g | Freq: Three times a day (TID) | ORAL | Status: DC
  Administered 2023-04-02 – 2023-04-06 (×17): 1 g
  Filled 2023-04-01 (×17): qty 10

## 2023-04-01 MED ORDER — VITAMIN D 25 MCG (1000 UNIT) PO TABS
5000.0000 [IU] | ORAL_TABLET | Freq: Every day | ORAL | Status: DC
  Administered 2023-04-02 – 2023-04-05 (×4): 5000 [IU]
  Filled 2023-04-01 (×4): qty 5

## 2023-04-01 MED ORDER — FOLIC ACID 1 MG PO TABS: 1.0000 mg | ORAL_TABLET | Freq: Every day | ORAL | Status: DC

## 2023-04-01 NOTE — Progress Notes (Signed)
Progress Note  11 Days Post-Op  Subjective: Pt still having issues with hypoglycemia, started on TF yesterday afternoon and required D10. Patient reports she is feeling more hungry this AM. She attributes poor appetite yesterday to being tired. She reports that she feels she has to change her goals with PT - she wants to be able to use a wheelchair rather than walk but then build towards walking more.   Objective: Vital signs in last 24 hours: Temp:  [97.8 F (36.6 C)-97.9 F (36.6 C)] 97.8 F (36.6 C) (12/06 0649) Pulse Rate:  [84-96] 84 (12/06 0649) Resp:  [15-18] 18 (12/06 0649) BP: (105-139)/(51-81) 130/74 (12/06 0649) SpO2:  [100 %] 100 % (12/05 2056) Weight:  [106.7 kg] 106.7 kg (12/06 0500) Last BM Date : 03/31/23  Intake/Output from previous day: 12/05 0701 - 12/06 0700 In: 2899.1 [P.O.:360; I.V.:1987.5; NG/GT:143.2; IV Piggyback:408.5] Out: -  Intake/Output this shift: Total I/O In: 240 [P.O.:240] Out: 750 [Urine:750]  PE: General: pleasant, WD, obese female who is laying in bed in NAD Lungs: Respiratory effort nonlabored Abd: soft, appropriately ttp, G tube with TF running @ 20cc/h, midline dressing C/D/I and JP site clean with minimal drainage    Lab Results:  Recent Labs    03/30/23 0437  WBC 10.7*  HGB 8.9*  HCT 27.7*  PLT 381   BMET Recent Labs    03/31/23 0452 04/01/23 0440  NA 135 134*  K 3.3* 4.0  CL 109 109  CO2 20* 18*  GLUCOSE 92 156*  BUN 8 6*  CREATININE 0.70 0.59  CALCIUM 7.9* 7.8*   PT/INR No results for input(s): "LABPROT", "INR" in the last 72 hours. CMP     Component Value Date/Time   NA 134 (L) 04/01/2023 0440   K 4.0 04/01/2023 0440   CL 109 04/01/2023 0440   CO2 18 (L) 04/01/2023 0440   GLUCOSE 156 (H) 04/01/2023 0440   BUN 6 (L) 04/01/2023 0440   CREATININE 0.59 04/01/2023 0440   CREATININE 0.91 07/26/2017 1127   CALCIUM 7.8 (L) 04/01/2023 0440   PROT 4.7 (L) 03/27/2023 0242   ALBUMIN 1.7 (L) 03/27/2023 0242    AST 27 03/27/2023 0242   AST 15 07/26/2017 1127   ALT 22 03/27/2023 0242   ALT 14 07/26/2017 1127   ALKPHOS 164 (H) 03/27/2023 0242   BILITOT 0.7 03/27/2023 0242   BILITOT 0.5 07/26/2017 1127   GFRNONAA >60 04/01/2023 0440   GFRNONAA >60 07/26/2017 1127   GFRAA >60 02/28/2019 1022   GFRAA >60 07/26/2017 1127   Lipase     Component Value Date/Time   LIPASE 55 (H) 03/21/2023 0715       Studies/Results: No results found.  Anti-infectives: Anti-infectives (From admission, onward)    Start     Dose/Rate Route Frequency Ordered Stop   03/21/23 1330  piperacillin-tazobactam (ZOSYN) IVPB 3.375 g  Status:  Discontinued        3.375 g 12.5 mL/hr over 240 Minutes Intravenous Every 8 hours 03/21/23 1325 03/28/23 1021        Assessment/Plan  Perforated marginal ulcer POD#11 - status post ex lap, abdominal washout, Graham patch repair of perforated gastrojejunal ulcer, drain placement  Dr. Freida Busman - WBC normalized and afebrile - zosyn stopped 12/2 - BID PPI and carafate - H. Pylori negative  - midline clean - continue daily dressing changes - mobilize as able - continues to have issues with hypoglycemia - ?dumping syndrome - started TF via gastrostomy in excluded stomach  12/5 - advance to goal and monitor - pt at risk for refeeding syndrome also so will need to monitor lytes  FEN: reg diet, TF via gastrostomy, D10 @75  cc/h VTE: LMWH ID: Zosyn 11/25>12/2  LOS: 11 days     Juliet Rude, [facility] 04/01/2023, 10:29 AM Please see Amion for pager number during day hours 7:00am-4:30pm

## 2023-04-01 NOTE — Progress Notes (Signed)
Progress Note   Patient: Christine [name] ZOX:[id number] DOB: [birth year] DOA: 03/21/2023     11 DOS: the patient was seen and examined on 04/01/2023   Brief hospital course: Patient is a 72 year old female history of hypertension, hyperlipidemia, OSA, GERD, morbid obesity, 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  Patient found to have intra-abdominal free air and fluid and concern for possible GJ anastomosis ulceration resulting in perforation.  Patient also noted to have significant abdominal pain and vomiting for 3 days prior to admission.  Patient was seen by general surgery and underwent  exploratory laparotomy, abdominal washout, Graham patch repair of perforated gastrojejunal ulcer, intraperitoneal drain placement.  Patient subsequently admitted to the stepdown unit for closer monitoring since hypotensive and also in acute renal failure.  Patient was then transferred out of the stepdown unit.  At this time general surgery is closely following the patient and has been advanced to bariatric advance diet which she has tolerated.  Patient however continues to have hypoglycemia initially on D10 infusion.  Plan is skilled nursing facility on discharge when okay with general surgery.   Assessment and Plan: Perforated marginal ulcer Likely secondary to G-tube displacement versus GJ anastomosis ulceration.   Status-post ex lap, abdominal washout, Graham patch repair of perforated gastrojejunal ulcer, intraperitoneal drain placement on 03/21/2023.   Continues to have difficulty with hypoglycemia (down to 32) on 10cc/hr TF, thought to be dumping syndrome. Now at goal rate of 30mL per hour. Had low of 43 at 1146 AM. Continue oral diet and TF. Barrier to discharge is hypoglycemia    Hypoglycemia  Recurrent with low of 32. Asymptomatic. Poor oral intake Appreciate surgical  intervention, started tube feeds 12/5.  Hypokalemia Resolved. Mg2+ and phos WNL   Metabolic encephalopathy -- resolved   Likely multifactorial from surgery, IV narcotics and renal failure and hypoglycemia.     Hypotension -- resolved   AKI -- resolved Mild NAGMA Baseline creatinine around 0.7. Peaked at 2.11 this admission. Now back to baseline.   Hypokalemia -- resolved   Probable UTI -- resolved Urine culture with 60,000 colonies of E. coli resistant to multiple antibiotics except Zosyn.  Completed course of Zosyn during hospitalization.   Abnormal EKG with ST depression On admission.  No previous EKG. Admitting physician discussed with cardiology and recommended monitoring.  Patient did have tachycardia.  2D echocardiogram with LV function of 50 to 55% with no regional wall motion abnormality. May need outpatient follow-up with cardiology.   GERD Continue PPI.   Hypernatremia -- resolved   Hypomagnesemia -- resolved   Normocytic anemia Ferritin of 229, serum iron low at 11 TIBC low, folic acid normal at 19, vitamin B12 elevated at 1005. Hgb baseline 8-9 from May of this year Hgb stable, follow-up as an outpatient.   Bilateral medial aspect of leg tenderness   Has a chronic venous stasis.  Pitting edema noted with some tenderness, chronic per patient Oral Lasix for total 3 days.   Debility, deconditioning   PT OT recommended skilled nursing facility on discharge. Needs to be up to chair with meals, and mobilized multiple times per day.        Subjective:  Having difficulty eating, takes a lot of pills in the morning.  RN wonders if moving some pills to later in the day might help her eat more. Hypoglycemia at midnight despite TF, given 1 amp D50  Physical  Exam: Vitals:   03/31/23 2056 04/01/23 0500 04/01/23 0649 04/01/23 1151  BP: 139/81  130/74 (!) 142/80  Pulse: 96  84 (!) 106  Resp: 15  18 16   Temp: 97.9 F (36.6 C)  97.8 F (36.6 C) 97.7 F (36.5 C)   TempSrc:      SpO2: 100%   100%  Weight:  106.7 kg    Height:       Physical Exam Vitals reviewed.  Constitutional:      General: She is not in acute distress.    Appearance: She is not ill-appearing or toxic-appearing.  Cardiovascular:     Rate and Rhythm: Normal rate and regular rhythm.     Heart sounds: No murmur heard. Pulmonary:     Effort: Pulmonary effort is normal. No respiratory distress.     Breath sounds: No wheezing, rhonchi or rales.  Skin:    Comments: Abdomen: Lower vertical open wound packed by nursing. Feeding tube in place.  Neurological:     Mental Status: She is alert.  Psychiatric:        Mood and Affect: Mood normal.        Behavior: Behavior normal.     Data Reviewed: CBG one low 32, this AM 77 BMP noted, Mg2+ WNL, phos WNL, K+ WNL  Family Communication: sister by telephone, in room  Disposition: Status is: Inpatient Remains inpatient appropriate because: recurrent hypoglycemia     Time spent: 35 minutes  Author: Brendia Sacks, MD 04/01/2023 3:56 PM  For on call review [email or web address].

## 2023-04-01 NOTE — Plan of Care (Signed)

## 2023-04-01 NOTE — Progress Notes (Addendum)
    Patient Name: Christine [name]           DOB: [birth year]  MRN: [id number]      Admission Date: 03/21/2023  Attending Provider: Standley Brooking, MD  Primary Diagnosis: Bowel perforation [facility])   Level of care: Med-Surg    CROSS COVER NOTE   Date of Service   04/01/2023   Christine [name], 72 y.o. female, was admitted on 03/21/2023 for Bowel perforation [facility]).    HPI/Events of Note   Hypoglycemic at midnight, CBG 32.  Asymptomatic. Recurrent problem.   Patient was started earlier this evening on tube feeds, currently running at 10 cc/h.  Goal is for 30 cc/h.  Patient is not eating or drinking well.  Full amp D50 given with recheck CBG of 74.   Patient started on D10 at 50 cc/ hr   Interventions/ Plan   D10 gtt added back while titrating tube feeds        Anthoney Harada, DNP, [employer]- [employer] [facility] [place]

## 2023-04-02 DIAGNOSIS — K631 Perforation of intestine (nontraumatic): Secondary | ICD-10-CM | POA: Diagnosis not present

## 2023-04-02 DIAGNOSIS — E162 Hypoglycemia, unspecified: Secondary | ICD-10-CM | POA: Diagnosis not present

## 2023-04-02 LAB — BASIC METABOLIC PANEL
Anion gap: 4 — ABNORMAL LOW (ref 5–15)
BUN: 6 mg/dL — ABNORMAL LOW (ref 8–23)
CO2: 20 mmol/L — ABNORMAL LOW (ref 22–32)
Calcium: 7.6 mg/dL — ABNORMAL LOW (ref 8.9–10.3)
Chloride: 112 mmol/L — ABNORMAL HIGH (ref 98–111)
Creatinine, Ser: 0.65 mg/dL (ref 0.44–1.00)
GFR, Estimated: >60 mL/min (ref >60)
Glucose, Bld: 88 mg/dL (ref 70–99)
Potassium: 4.2 mmol/L (ref 3.5–5.1)
Sodium: 136 mmol/L (ref 135–145)

## 2023-04-02 LAB — GLUCOSE, CAPILLARY
Glucose-Capillary: 61 mg/dL — ABNORMAL LOW (ref 70–99)
Glucose-Capillary: 65 mg/dL — ABNORMAL LOW (ref 70–99)
Glucose-Capillary: 73 mg/dL (ref 70–99)
Glucose-Capillary: 69 mg/dL — ABNORMAL LOW (ref 70–99)
Glucose-Capillary: 74 mg/dL (ref 70–99)

## 2023-04-02 LAB — PHOSPHORUS: Phosphorus: 2.9 mg/dL (ref 2.5–4.6)

## 2023-04-02 LAB — MAGNESIUM: Magnesium: 1.9 mg/dL (ref 1.7–2.4)

## 2023-04-02 MED ORDER — ORAL CARE MOUTH RINSE: 15.0000 mL | OROMUCOSAL | Status: DC | PRN

## 2023-04-02 MED ORDER — FUROSEMIDE 20 MG PO TABS
20.0000 mg | ORAL_TABLET | Freq: Every day | ORAL | Status: DC
  Administered 2023-04-02 – 2023-04-06 (×5): 20 mg via ORAL
  Filled 2023-04-02 (×5): qty 1

## 2023-04-02 NOTE — Progress Notes (Signed)
Patient has been reminded twice since FSBS check of 62 to consume the preferred snack of her choice([place] ice), patient has not opened it yet, aware of the risks related to low blood sugar levels, will continue to monitor. Patient remains asymptomatic.

## 2023-04-02 NOTE — Progress Notes (Signed)
Progress Note   Patient: Christine [name] OZH:[id number] DOB: May 14, 1950 DOA: 03/21/2023     12 DOS: the patient was seen and examined on 04/02/2023   Brief hospital course: Patient is a 72 year old female history of hypertension, hyperlipidemia, OSA, GERD, morbid obesity, 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  Patient found to have intra-abdominal free air and fluid and concern for possible GJ anastomosis ulceration resulting in perforation.  Patient also noted to have significant abdominal pain and vomiting for 3 days prior to admission.  Patient was seen by general surgery and underwent  exploratory laparotomy, abdominal washout, Graham patch repair of perforated gastrojejunal ulcer, intraperitoneal drain placement.  Patient subsequently admitted to the stepdown unit for closer monitoring since hypotensive and also in acute renal failure.  Patient was then transferred out of the stepdown unit.  At this time general surgery is closely following the patient and has been advanced to bariatric advance diet which she has tolerated.  Patient however continues to have hypoglycemia initially on D10 infusion.  Plan is skilled nursing facility on discharge when okay with general surgery.   Assessment and Plan: Perforated marginal ulcer Likely secondary to G-tube displacement versus GJ anastomosis ulceration.   Status-post ex lap, abdominal washout, Graham patch repair of perforated gastrojejunal ulcer, intraperitoneal drain placement on 03/21/2023.   Continues to have difficulty with hypoglycemia (down to 32) on 10cc/hr TF, thought to be dumping syndrome. Now at goal rate of 30mL per hour. Had low of 43 at 1146 AM. Continue oral diet and TF. Barrier to discharge is hypoglycemia    Hypoglycemia  Recurrent with low of 32. Asymptomatic. Appreciate surgical intervention, started tube  feeds 12/5. CBG range last 24 hours: 43-122 CBG lows: 43, 62, 61 Oral intake remains poor.  Continue tube feeds.   Hypokalemia Resolved. Mg2+ and phos WNL Monitor for refeeding syndrome. Check Mg2+ and phos in AM. Mg2+ and phos WNL.     Metabolic encephalopathy -- resolved   Likely multifactorial from surgery, IV narcotics and renal failure and hypoglycemia.     Hypotension -- resolved   AKI -- resolved Mild NAGMA Baseline creatinine around 0.7. Peaked at 2.11 this admission. Now back to baseline.   Hypokalemia -- resolved   Probable UTI -- resolved Urine culture with 60,000 colonies of E. coli resistant to multiple antibiotics except Zosyn.  Completed course of Zosyn during hospitalization.   Abnormal EKG with ST depression On admission.  No previous EKG. Admitting physician discussed with cardiology and recommended monitoring.  Patient did have tachycardia.  2D echocardiogram with LV function of 50 to 55% with no regional wall motion abnormality. May need outpatient follow-up with cardiology.   GERD Continue PPI.   Hypernatremia -- resolved   Hypomagnesemia -- resolved   Normocytic anemia Ferritin of 229, serum iron low at 11 TIBC low, folic acid normal at 19, vitamin B12 elevated at 1005. Hgb baseline 8-9 from May of this year Hgb stable, follow-up as an outpatient.   Bilateral medial aspect of leg tenderness   Has a chronic venous stasis.  Pitting edema noted with some tenderness, chronic per patient Oral Lasix for total 3 days.   Debility, deconditioning   PT OT recommended skilled nursing facility on discharge. Needs to be up to chair with meals, and mobilized multiple times per day.      Subjective:  Hypoglycemia noted, encouraged to consume [place] ice.  Ate a few bites of breakfast "I feel full".  Initially reported a lot more intake, but RN contradicts--confirms a few bites.  Vomited last night "many times"...  RN did not receive any report of  this, and patient was not given any antiemetics.  Physical Exam: Vitals:   04/01/23 1151 04/01/23 2003 04/02/23 0359 04/02/23 0455  BP: (!) 142/80 134/70 132/79   Pulse: (!) 106 68 (!) 102   Resp: 16 18 17    Temp: 97.7 F (36.5 C) 98.1 F (36.7 C) 98.3 F (36.8 C)   TempSrc:      SpO2: 100% 93% 99%   Weight:    106.7 kg  Height:       Physical Exam Vitals reviewed.  Constitutional:      General: She is not in acute distress.    Appearance: She is not ill-appearing or toxic-appearing.  Cardiovascular:     Rate and Rhythm: Normal rate and regular rhythm.     Heart sounds: No murmur heard. Pulmonary:     Effort: Pulmonary effort is normal. No respiratory distress.     Breath sounds: No wheezing, rhonchi or rales.  Neurological:     Mental Status: She is alert.  Psychiatric:        Mood and Affect: Mood normal.        Behavior: Behavior normal.     Data Reviewed: CBG range last 24 hours: 43-122 CBG lows: 43, 62, 61 Mg2+ and phos WNL K+ WNL  Family Communication: none  Disposition: Status is: Inpatient Remains inpatient appropriate because: hypoglycemia     Time spent: 20 minutes  Author: Brendia Sacks, MD 04/02/2023 8:00 AM  For on call review [email or web address].

## 2023-04-02 NOTE — Plan of Care (Signed)

## 2023-04-02 NOTE — Progress Notes (Signed)
Physical Therapy Treatment Patient Details Name: Christine [name] MRN: [id number] DOB: 1951/03/07 Today's Date: 04/02/2023   History of Present Illness Patient is a 72 year old female admitted 03/21/23 with abdominal pain and vomiting. S/p ex lap, abdominal washout, Graham patch repair of perforated gastrojejunal ulcer, drain placement 03/21/2023. PMH includes HTN, hyperlipidemia, OSA, GERD, morbid obesity, esophageal dysmotility with gastric bypass May 2024 followed by failure to thrive with G-tube placement on 01/2023.    PT Comments  Pt stated she'd like to work on wheelchair mobility and transfers. Pt required less assistance today for bed mobility and transfers. She puts forth good effort. Noted pt was soiled with large BM at start of session. Assisted pt with cleanup, NT notified of need for further cleanup.    If plan is discharge home, recommend the following: A lot of help with bathing/dressing/bathroom;Assistance with cooking/housework;Assist for transportation;Help with stairs or ramp for entrance;A lot of help with walking and/or transfers   Can travel by private vehicle     No  Equipment Recommendations  None recommended by PT    Recommendations for Other Services       Precautions / Restrictions Precautions Precautions: Fall Precaution Comments: abdominal sx Restrictions Weight Bearing Restrictions: No     Mobility  Bed Mobility   Bed Mobility: Supine to Sit   Sidelying to sit: Min assist, Used rails, HOB elevated       General bed mobility comments: assist to pivot hips to EOB using bedpad    Transfers Overall transfer level: Needs assistance Equipment used: Rolling walker (2 wheels) Transfers: Sit to/from Stand, Bed to chair/wheelchair/BSC Sit to Stand: Min assist   Step pivot transfers: Contact guard assist       General transfer comment: assist to power up, VCs for safe hand placement    Ambulation/Gait   Gait Distance (Feet): 3 Feet Assistive  device: Rolling walker (2 wheels) Gait Pattern/deviations: Steppage, Trunk flexed, Wide base of support, Decreased step length - right, Decreased step length - left       General Gait Details: Only took steps when performing transfers, pt presents with a lateral sway, wide BOS, flexed trunk.   Stairs             Wheelchair Mobility     Tilt Bed    Modified Rankin (Stroke Patients Only)       Balance Overall balance assessment: Needs assistance Sitting-balance support: Feet supported Sitting balance-Leahy Scale: Good Sitting balance - Comments: initially dizzy, improved as she sat EOB   Standing balance support: Bilateral upper extremity supported, Reliant on assistive device for balance                                Cognition Arousal: Alert Behavior During Therapy: WFL for tasks assessed/performed Overall Cognitive Status: Within Functional Limits for tasks assessed                                 General Comments: oriented x4, motivated        Exercises General Exercises - Lower Extremity Ankle Circles/Pumps: AROM, Both, 15 reps, Seated Long Arc Quad: AROM, Both, 10 reps, Seated Hip Flexion/Marching: AROM, Both, 10 reps, Seated    General Comments        Pertinent Vitals/Pain Pain Assessment Pain Assessment: No/denies pain Faces Pain Scale: No hurt    Home Living  Prior Function            PT Goals (current goals can now be found in the care plan section) Acute Rehab PT Goals Patient Stated Goal: to move back to be  with family PT Goal Formulation: With patient/family Time For Goal Achievement: 04/11/23 Potential to Achieve Goals: Good    Frequency    Min 1X/week      PT Plan      Co-evaluation              AM-PAC PT "6 Clicks" Mobility   Outcome Measure  Help needed turning from your back to your side while in a flat bed without using bedrails?: A Lot Help  needed moving from lying on your back to sitting on the side of a flat bed without using bedrails?: A Lot Help needed moving to and from a bed to a chair (including a wheelchair)?: A Little Help needed standing up from a chair using your arms (e.g., wheelchair or bedside chair)?: A Lot Help needed to walk in hospital room?: Total Help needed climbing 3-5 steps with a railing? : Total 6 Click Score: 11    End of Session Equipment Utilized During Treatment: Gait belt Activity Tolerance: Patient tolerated treatment well Patient left: in chair;with call bell/phone within reach;with chair alarm set Nurse Communication: Mobility status PT Visit Diagnosis: Unsteadiness on feet (R26.81);Difficulty in walking, not elsewhere classified (R26.2);Pain     Time: 1610-9604 PT Time Calculation (min) (ACUTE ONLY): 18 min  Charges:    $Therapeutic Activity: 8-22 mins PT General Charges $$ ACUTE PT VISIT: 1 Visit                     Tamala Ser PT 04/02/2023  Acute Rehabilitation Services  Office [phone]

## 2023-04-02 NOTE — Progress Notes (Signed)
12 Days Post-Op   Subjective/Chief Complaint: Comfortable, feels a little better Tolerating tube feeds and po   Objective: Vital signs in last 24 hours: Temp:  [97.7 F (36.5 C)-98.3 F (36.8 C)] 98.3 F (36.8 C) (12/07 0359) Pulse Rate:  [68-106] 102 (12/07 0359) Resp:  [16-18] 17 (12/07 0359) BP: (132-142)/(70-80) 132/79 (12/07 0359) SpO2:  [93 %-100 %] 99 % (12/07 0359) Weight:  [106.7 kg] 106.7 kg (12/07 0455) Last BM Date : 04/01/23  Intake/Output from previous day: 12/06 0701 - 12/07 0700 In: 1007 [P.O.:480; NG/GT:437] Out: 750 [Urine:750] Intake/Output this shift: No intake/output data recorded.  Exam: Awake and alert Abdomen soft, open wound clean, G-tube working well, non-distended  Lab Results:  No results for input(s): "WBC", "HGB", "HCT", "PLT" in the last 72 hours. BMET Recent Labs    03/31/23 0452 04/01/23 0440  NA 135 134*  K 3.3* 4.0  CL 109 109  CO2 20* 18*  GLUCOSE 92 156*  BUN 8 6*  CREATININE 0.70 0.59  CALCIUM 7.9* 7.8*   PT/INR No results for input(s): "LABPROT", "INR" in the last 72 hours. ABG No results for input(s): "PHART", "HCO3" in the last 72 hours.  Invalid input(s): "PCO2", "PO2"  Studies/Results: No results found.  Anti-infectives: Anti-infectives (From admission, onward)    Start     Dose/Rate Route Frequency Ordered Stop   03/21/23 1330  piperacillin-tazobactam (ZOSYN) IVPB 3.375 g  Status:  Discontinued        3.375 g 12.5 mL/hr over 240 Minutes Intravenous Every 8 hours 03/21/23 1325 03/28/23 1021       Assessment/Plan: Perforated marginal ulcer POD#12 - status post ex lap, abdominal washout, Graham patch repair of perforated gastrojejunal ulcer, drain placement  Dr. Freida Busman  Continue current care, advancing tube feeds Wound care Continue to monitor hypoglycemia  Abigail Miyamoto MD 04/02/2023

## 2023-04-03 DIAGNOSIS — E162 Hypoglycemia, unspecified: Secondary | ICD-10-CM | POA: Diagnosis not present

## 2023-04-03 DIAGNOSIS — K631 Perforation of intestine (nontraumatic): Secondary | ICD-10-CM | POA: Diagnosis not present

## 2023-04-03 LAB — PHOSPHORUS: Phosphorus: 3.1 mg/dL (ref 2.5–4.6)

## 2023-04-03 LAB — BASIC METABOLIC PANEL
Anion gap: 7 (ref 5–15)
BUN: 10 mg/dL (ref 8–23)
CO2: 20 mmol/L — ABNORMAL LOW (ref 22–32)
Calcium: 8 mg/dL — ABNORMAL LOW (ref 8.9–10.3)
Chloride: 109 mmol/L (ref 98–111)
Creatinine, Ser: 0.66 mg/dL (ref 0.44–1.00)
GFR, Estimated: >60 mL/min (ref >60)
Glucose, Bld: 89 mg/dL (ref 70–99)
Potassium: 4.5 mmol/L (ref 3.5–5.1)
Sodium: 136 mmol/L (ref 135–145)

## 2023-04-03 LAB — GLUCOSE, CAPILLARY
Glucose-Capillary: 64 mg/dL — ABNORMAL LOW (ref 70–99)
Glucose-Capillary: 67 mg/dL — ABNORMAL LOW (ref 70–99)
Glucose-Capillary: 70 mg/dL (ref 70–99)
Glucose-Capillary: 73 mg/dL (ref 70–99)
Glucose-Capillary: 78 mg/dL (ref 70–99)
Glucose-Capillary: 82 mg/dL (ref 70–99)

## 2023-04-03 LAB — MAGNESIUM: Magnesium: 1.8 mg/dL (ref 1.7–2.4)

## 2023-04-03 NOTE — Progress Notes (Signed)
Patient FSBS 61, patient is alert and oriented, asymptomatic. Patient preferred snack at this time is a banana, patient will notify staff when she is done eating it. Patient with poor appetite and unable to tolerate eating well. Per patient , she has to take a bite or two then wait a bit and take another bite or two, patient said if she does not eat this way then she gets sick to her stomach, has been this way since her gastric surgery. Will continue to monitor, blood glucose checks continue every 4 hours and tube feedings continue at goal rate.

## 2023-04-03 NOTE — Progress Notes (Signed)
Mobility Specialist - Progress Note   04/03/23 0927  Mobility  Activity Transferred from bed to chair  Level of Assistance Standby assist, set-up cues, supervision of patient - no hands on  Assistive Device Front wheel walker  Range of Motion/Exercises Active  Activity Response Tolerated well  Mobility Referral Yes  Mobility visit 1 Mobility  Mobility Specialist Start Time (ACUTE ONLY) 0845  Mobility Specialist Stop Time (ACUTE ONLY) 0905  Mobility Specialist Time Calculation (min) (ACUTE ONLY) 20 min   Received in bed and agreed to transfer. Had no issues throughout session. Standby for bed mobility and STS. Shuffled steps to chair where she was left with all needs met.  Marilynne Halsted Mobility Specialist

## 2023-04-03 NOTE — Plan of Care (Signed)

## 2023-04-03 NOTE — Progress Notes (Signed)
13 Days Post-Op   Subjective/Chief Complaint: Feels like appetite is a little suppressed with tube feeds Denies pain or nausea    Objective: Vital signs in last 24 hours: Temp:  [97.5 F (36.4 C)-98 F (36.7 C)] 97.7 F (36.5 C) (12/08 0454) Pulse Rate:  [93-109] 93 (12/08 0454) Resp:  [15-17] 15 (12/08 0454) BP: (128-145)/(65-89) 128/65 (12/08 0454) SpO2:  [100 %] 100 % (12/08 0454) Weight:  [106.4 kg] 106.4 kg (12/08 0500) Last BM Date : 04/02/23  Intake/Output from previous day: 12/07 0701 - 12/08 0700 In: 973 [P.O.:240; NG/GT:733] Out: 1100 [Urine:1100] Intake/Output this shift: No intake/output data recorded.  Exam: Awake and alert Comfortable Abdomen soft, ND, wound clean Tube feeds at 30cc/her   Lab Results:  No results for input(s): "WBC", "HGB", "HCT", "PLT" in the last 72 hours. BMET Recent Labs    04/02/23 1012 04/03/23 0423  NA 136 136  K 4.2 4.5  CL 112* 109  CO2 20* 20*  GLUCOSE 88 89  BUN 6* 10  CREATININE 0.65 0.66  CALCIUM 7.6* 8.0*   PT/INR No results for input(s): "LABPROT", "INR" in the last 72 hours. ABG No results for input(s): "PHART", "HCO3" in the last 72 hours.  Invalid input(s): "PCO2", "PO2"  Studies/Results: No results found.  Anti-infectives: Anti-infectives (From admission, onward)    Start     Dose/Rate Route Frequency Ordered Stop   03/21/23 1330  piperacillin-tazobactam (ZOSYN) IVPB 3.375 g  Status:  Discontinued        3.375 g 12.5 mL/hr over 240 Minutes Intravenous Every 8 hours 03/21/23 1325 03/28/23 1021       Assessment/Plan:  Perforated marginal ulcer POD#13 - status post ex lap, abdominal washout, Graham patch repair of perforated gastrojejunal ulcer, drain placement  Dr. Freida Busman   -still with mild hypoglycemia, but improving -worked with PT yesterday -continue wound care, consider change to wound VAC Abigail Miyamoto 04/03/2023

## 2023-04-03 NOTE — Progress Notes (Signed)
Progress Note   Patient: Christine [name] ZOX:[id number] DOB: Nov 03, 1950 DOA: 03/21/2023     13 DOS: the patient was seen and examined on 04/03/2023   Brief hospital course: Patient is a 72 year old female history of hypertension, hyperlipidemia, OSA, GERD, morbid obesity, 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  Patient found to have intra-abdominal free air and fluid and concern for possible GJ anastomosis ulceration resulting in perforation.  Patient also noted to have significant abdominal pain and vomiting for 3 days prior to admission.  Patient was seen by general surgery and underwent  exploratory laparotomy, abdominal washout, Graham patch repair of perforated gastrojejunal ulcer, intraperitoneal drain placement.  Patient subsequently admitted to the stepdown unit for closer monitoring since hypotensive and also in acute renal failure.  Patient was then transferred out of the stepdown unit.  At this time general surgery is closely following the patient and has been advanced to bariatric advance diet which she has tolerated.  Patient however continues to have hypoglycemia initially on D10 infusion.  Plan is skilled nursing facility on discharge when okay with general surgery.   Assessment and Plan: Perforated marginal ulcer Likely secondary to G-tube displacement versus GJ anastomosis ulceration.   Status-post ex lap, abdominal washout, Graham patch repair of perforated gastrojejunal ulcer, intraperitoneal drain placement on 03/21/2023.   Continue oral diet and TF. Barrier to discharge is hypoglycemia    Hypoglycemia  Recurrent with low of 32. Asymptomatic. Appreciate surgical intervention, started tube feeds 12/5. CBG lows 67, 69; better last 24 hours. Oral intake remains poor.  Continue tube feeds.   Hypokalemia Resolved. Mg2+ and phos WNL Monitor for refeeding  syndrome. Check Mg2+ and phos in AM. Mg2+ and phos WNL.     Metabolic encephalopathy -- resolved   Likely multifactorial from surgery, IV narcotics and renal failure and hypoglycemia.     Hypotension -- resolved   AKI -- resolved Mild NAGMA Baseline creatinine around 0.7. Peaked at 2.11 this admission. Now back to baseline.   Hypokalemia -- resolved   Probable UTI -- resolved Urine culture with 60,000 colonies of E. coli resistant to multiple antibiotics except Zosyn.  Completed course of Zosyn during hospitalization.   Abnormal EKG with ST depression On admission.  No previous EKG. Admitting physician discussed with cardiology and recommended monitoring.  Patient did have tachycardia.  2D echocardiogram with LV function of 50 to 55% with no regional wall motion abnormality. May need outpatient follow-up with cardiology.   GERD Continue PPI.   Hypernatremia -- resolved   Hypomagnesemia -- resolved   Normocytic anemia Ferritin of 229, serum iron low at 11 TIBC low, folic acid normal at 19, vitamin B12 elevated at 1005. Hgb baseline 8-9 from May of this year Hgb stable, follow-up as an outpatient.   Bilateral medial aspect of leg tenderness   Has a chronic venous stasis.  Pitting edema noted with some tenderness, chronic per patient Continue oral Lasix   Debility, deconditioning   PT OT recommended skilled nursing facility on discharge. Needs to be up to chair with meals, and mobilized multiple times per day.  Morbid obesity Body mass index is 44.32 kg/m.     Subjective:  Feels ok Did eat a little last night and this AM  Physical Exam: Vitals:   04/02/23 1216 04/02/23 1911 04/03/23 0454 04/03/23 0500  BP: (!) 145/89 (!) 143/75 128/65   Pulse: (!) 109 96  93   Resp: 16 17 15    Temp: (!) 97.5 F (36.4 C) 98 F (36.7 C) 97.7 F (36.5 C)   TempSrc:      SpO2: 100% 100% 100%   Weight:    106.4 kg  Height:       Physical Exam Vitals reviewed.   Constitutional:      General: She is not in acute distress.    Appearance: She is not ill-appearing or toxic-appearing.     Comments: Sitting in chair  Cardiovascular:     Rate and Rhythm: Normal rate and regular rhythm.     Heart sounds: No murmur heard. Pulmonary:     Effort: Pulmonary effort is normal. No respiratory distress.     Breath sounds: No wheezing, rhonchi or rales.  Neurological:     Mental Status: She is alert.  Psychiatric:        Mood and Affect: Mood normal.        Behavior: Behavior normal.     Data Reviewed: CBG lows 67, 69 BMP, Mg2+, phos noted  Family Communication: none  Disposition: Status is: Inpatient Remains inpatient appropriate because: hypoglycemia     Time spent: 20 minutes  Author: Brendia Sacks, MD 04/03/2023 9:23 AM  For on call review [email or web address].

## 2023-04-04 DIAGNOSIS — E162 Hypoglycemia, unspecified: Secondary | ICD-10-CM | POA: Diagnosis not present

## 2023-04-04 DIAGNOSIS — K631 Perforation of intestine (nontraumatic): Secondary | ICD-10-CM | POA: Diagnosis not present

## 2023-04-04 DIAGNOSIS — F330 Major depressive disorder, recurrent, mild: Secondary | ICD-10-CM | POA: Diagnosis not present

## 2023-04-04 LAB — BASIC METABOLIC PANEL
Anion gap: 7 (ref 5–15)
Anion gap: 7 (ref 5–15)
BUN: 12 mg/dL (ref 8–23)
BUN: 13 mg/dL (ref 8–23)
CO2: 21 mmol/L — ABNORMAL LOW (ref 22–32)
CO2: 21 mmol/L — ABNORMAL LOW (ref 22–32)
Calcium: 7.9 mg/dL — ABNORMAL LOW (ref 8.9–10.3)
Calcium: 8.1 mg/dL — ABNORMAL LOW (ref 8.9–10.3)
Chloride: 105 mmol/L (ref 98–111)
Chloride: 107 mmol/L (ref 98–111)
Creatinine, Ser: 0.61 mg/dL (ref 0.44–1.00)
Creatinine, Ser: 0.63 mg/dL (ref 0.44–1.00)
GFR, Estimated: >60 mL/min (ref >60)
GFR, Estimated: >60 mL/min (ref >60)
Glucose, Bld: 89 mg/dL (ref 70–99)
Glucose, Bld: 98 mg/dL (ref 70–99)
Potassium: 3.9 mmol/L (ref 3.5–5.1)
Potassium: 4.1 mmol/L (ref 3.5–5.1)
Sodium: 133 mmol/L — ABNORMAL LOW (ref 135–145)
Sodium: 135 mmol/L (ref 135–145)

## 2023-04-04 LAB — GLUCOSE, CAPILLARY
Glucose-Capillary: 83 mg/dL (ref 70–99)
Glucose-Capillary: 91 mg/dL (ref 70–99)
Glucose-Capillary: 76 mg/dL (ref 70–99)
Glucose-Capillary: 93 mg/dL (ref 70–99)
Glucose-Capillary: 66 mg/dL — ABNORMAL LOW (ref 70–99)
Glucose-Capillary: 74 mg/dL (ref 70–99)

## 2023-04-04 LAB — COPPER, SERUM: Copper: 65 ug/dL — ABNORMAL LOW (ref 80–158)

## 2023-04-04 LAB — ZINC: Zinc: 59 ug/dL (ref 44–115)

## 2023-04-04 NOTE — Progress Notes (Signed)
Physical Therapy Treatment Patient Details Name: Christine [name] MRN: [id number] DOB: 01-30-1951 Today's Date: 04/04/2023   History of Present Illness Patient is a 72 year old female admitted 03/21/23 with abdominal pain and vomiting. S/p ex lap, abdominal washout, Graham patch repair of perforated gastrojejunal ulcer, drain placement 03/21/2023. PMH includes HTN, hyperlipidemia, OSA, GERD, morbid obesity, esophageal dysmotility with gastric bypass May 2024 followed by failure to thrive with G-tube placement on 01/2023.    PT Comments  Pt agreeable to working with PT/OT. She reports not feeling well today. She was able to progress activity a bit. Min-Mod A for safe mobility on today. Patient will benefit from continued inpatient follow up therapy, <3 hours/day.     If plan is discharge home, recommend the following: A lot of help with bathing/dressing/bathroom;Assistance with cooking/housework;Assist for transportation;Help with stairs or ramp for entrance;A lot of help with walking and/or transfers   Can travel by private vehicle     No  Equipment Recommendations  None recommended by PT    Recommendations for Other Services       Precautions / Restrictions Precautions Precautions: Fall Precaution Comments: abdominal sx; G tube Restrictions Weight Bearing Restrictions: No     Mobility  Bed Mobility Overal bed mobility: Needs Assistance Bed Mobility: Supine to Sit, Sit to Supine     Supine to sit: Contact guard, HOB elevated, Used rails Sit to supine: Mod assist   General bed mobility comments: Assist for bil LEs onto bed. Increased time. Reliance on bedrail and HOB elevated    Transfers Overall transfer level: Needs assistance Equipment used: Rolling walker (2 wheels) Transfers: Sit to/from Stand Sit to Stand: Min assist           General transfer comment: x 3.Cues for safety, technique, hand placement. Increased time. Assist to power up, steady     Ambulation/Gait Ambulation/Gait assistance: Min assist, +2 safety/equipment Gait Distance (Feet): 5 Feet Assistive device: Rolling walker (2 wheels) Gait Pattern/deviations: Step-through pattern, Decreased stride length       General Gait Details: Assist to stabilize pt throughout short distance.Followed closely with recliner. Pt fatigued easily. Fall risk.   Stairs             Wheelchair Mobility     Tilt Bed    Modified Rankin (Stroke Patients Only)       Balance Overall balance assessment: Needs assistance         Standing balance support: Bilateral upper extremity supported, Reliant on assistive device for balance Standing balance-Leahy Scale: Poor                              Cognition Arousal: Alert Behavior During Therapy: WFL for tasks assessed/performed Overall Cognitive Status: Within Functional Limits for tasks assessed                                          Exercises      General Comments        Pertinent Vitals/Pain Pain Assessment Pain Assessment: No/denies pain    Home Living                          Prior Function            PT Goals (current goals can now be found in the care plan  section) Progress towards PT goals: Progressing toward goals    Frequency           PT Plan      Co-evaluation              AM-PAC PT "6 Clicks" Mobility   Outcome Measure  Help needed turning from your back to your side while in a flat bed without using bedrails?: A Little Help needed moving from lying on your back to sitting on the side of a flat bed without using bedrails?: A Lot Help needed moving to and from a bed to a chair (including a wheelchair)?: A Little Help needed standing up from a chair using your arms (e.g., wheelchair or bedside chair)?: A Little Help needed to walk in hospital room?: Total Help needed climbing 3-5 steps with a railing? : Total 6 Click Score: 13     End of Session   Activity Tolerance: Patient tolerated treatment well;Patient limited by fatigue Patient left: in bed;with call bell/phone within reach   PT Visit Diagnosis: Muscle weakness (generalized) (M62.81);Difficulty in walking, not elsewhere classified (R26.2)     Time: 3474-2595 PT Time Calculation (min) (ACUTE ONLY): 32 min  Charges:    $Gait Training: 8-22 mins PT General Charges $$ ACUTE PT VISIT: 1 Visit                        Faye Ramsay, PT Acute Rehabilitation  Office: [phone]

## 2023-04-04 NOTE — Progress Notes (Signed)
Occupational Therapy Treatment Patient Details Name: Christine [name] MRN: [id number] DOB: [birth year] Today's Date: 04/04/2023   History of present illness Patient is a 72 year old female admitted 03/21/23 with abdominal pain and vomiting. S/p ex lap, abdominal washout, Graham patch repair of perforated gastrojejunal ulcer, drain placement 03/21/2023. PMH includes HTN, hyperlipidemia, OSA, GERD, morbid obesity, esophageal dysmotility with gastric bypass May 2024 followed by failure to thrive with G-tube placement on 01/2023.   OT comments  Patient is making slow progress towards goals. Patient was aggreable to participate in session but did report feeling "off" today. Patient was able to participate in brief time out of bed with reports on headache onset. Patient  Patient will benefit from continued inpatient follow up therapy, <3 hours/day.        If plan is discharge home, recommend the following:  A lot of help with walking and/or transfers;A lot of help with bathing/dressing/bathroom;Assistance with cooking/housework;Assist for transportation;Help with stairs or ramp for entrance   Equipment Recommendations  None recommended by OT       Precautions / Restrictions Precautions Precautions: Fall Precaution Comments: abdominal sx Restrictions Weight Bearing Restrictions: No       Mobility Bed Mobility Overal bed mobility: Needs Assistance Bed Mobility: Supine to Sit   Sidelying to sit: HOB elevated, Contact guard assist Supine to sit: Mod assist, +2 for safety/equipment     General bed mobility comments: needed physical A to adance BLE back into bed.         Balance Overall balance assessment: Needs assistance Sitting-balance support: Feet supported Sitting balance-Leahy Scale: Good     Standing balance support: Bilateral upper extremity supported, Reliant on assistive device for balance Standing balance-Leahy Scale: Poor         ADL either performed or assessed  with clinical judgement   ADL Overall ADL's : Needs assistance/impaired     Grooming: Oral care;Set up;Bed level Grooming Details (indicate cue type and reason): with increased time. patient was not agreeable to move at first but warmed up after oral care and PT arrival in room.                 Toilet Transfer: +2 for physical assistance;+2 for safety/equipment;Stand-pivot;Rolling walker (2 wheels);Contact guard assist                    Cognition Arousal: Alert Behavior During Therapy: WFL for tasks assessed/performed Overall Cognitive Status: Within Functional Limits for tasks assessed                           Pertinent Vitals/ Pain       Pain Assessment Pain Assessment: No/denies pain         Frequency  Min 1X/week        Progress Toward Goals  OT Goals(current goals can now be found in the care plan section)  Progress towards OT goals: Progressing toward goals     Plan         AM-PAC OT "6 Clicks" Daily Activity     Outcome Measure   Help from another person eating meals?: None Help from another person taking care of personal grooming?: A Little Help from another person toileting, which includes using toliet, bedpan, or urinal?: A Lot Help from another person bathing (including washing, rinsing, drying)?: A Lot Help from another person to put on and taking off regular upper body clothing?: A Lot Help from another person to put on  and taking off regular lower body clothing?: A Lot 6 Click Score: 15    End of Session Equipment Utilized During Treatment: Rolling walker (2 wheels)  OT Visit Diagnosis: Unsteadiness on feet (R26.81);Other abnormalities of gait and mobility (R26.89);Muscle weakness (generalized) (M62.81)   Activity Tolerance Patient tolerated treatment well   Patient Left in chair;with call bell/phone within reach;with chair alarm set;with family/visitor present   Nurse Communication Mobility status;Precautions         Time: 0981-1914 OT Time Calculation (min): 40 min  Charges: OT General Charges $OT Visit: 1 Visit OT Treatments $Therapeutic Activity: 23-37 mins  Rosalio Loud, MS Acute Rehabilitation Department Office# [phone]   Selinda Flavin 04/04/2023, 12:06 PM

## 2023-04-04 NOTE — Progress Notes (Signed)
BG 64. Patient is asymptomatic.  Given italian ice and encouraged so sip on Glucerna/Boost throughout the night to aid in maintaining optimal blood glucose levels.

## 2023-04-04 NOTE — Progress Notes (Signed)
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  Patient found to have intra-abdominal free air and fluid and concern for possible GJ anastomosis ulceration resulting in perforation.  Patient also noted to have significant abdominal pain and vomiting for 3 days prior to admission.  Patient was seen by general surgery and underwent  exploratory laparotomy, abdominal washout, Graham patch repair of perforated gastrojejunal ulcer, intraperitoneal drain placement.  Patient subsequently admitted to the stepdown unit for closer monitoring since hypotensive and also in acute renal failure.  Patient was then transferred out of the stepdown unit.  At this time general surgery is closely following the patient and has been advanced to bariatric advance diet which she has tolerated.  Patient however continues to have hypoglycemia initially on D10 infusion.  Plan is skilled nursing facility on discharge when okay with general surgery.   Assessment and Plan: Perforated marginal ulcer Likely secondary to G-tube displacement versus GJ anastomosis ulceration.   Status-post ex lap, abdominal washout, Graham patch repair of perforated gastrojejunal ulcer, intraperitoneal drain placement on 03/21/2023.   Continue oral diet and TF. Barrier to discharge is hypoglycemia, (improving and she is nearing readiness for discharge.   Hypoglycemia  Failure to thrive with poor oral intake Appreciate surgical intervention, started tube feeds 12/5. Hypoglycemia events decreasing in severity: Last 24 hours 65, 69, 67, 64.  A.m. blood sugar 91.  Has not  required any pharmacologic treatment last 24 hours. No oral intake recorded last 24 hours however patient reports improvement in oral intake.  Patient has been observed to exaggerate or misreport oral intake.   GERD Continue PPI.  Normocytic anemia Ferritin of 229, serum iron low at 11 TIBC low, folic acid normal at 19, vitamin B12 elevated at 1005. Hgb baseline 8-9 from May of this year Hgb stable, follow-up as an outpatient.   Bilateral medial aspect of leg tenderness   Has a chronic venous stasis.  Pitting edema noted with some tenderness, chronic per patient Continue oral Lasix   Debility, deconditioning   PT OT recommended skilled nursing facility on discharge. Needs to be up to chair with meals, and mobilized multiple times per day.   Morbid obesity Body mass index is 44.32 kg/m  Hypokalemia, resolved. Monitor for refeeding syndrome. Check BMP, Mg2+ and phos in AM.   Metabolic encephalopathy -- resolved   Likely multifactorial from surgery, IV narcotics and renal failure and hypoglycemia.     Hypotension -- resolved   AKI -- resolved Mild NAGMA Baseline creatinine around 0.7. Peaked at 2.11 this admission. Back to baseline.   Hypokalemia -- resolved   Probable UTI -- resolved Urine culture with 60,000 colonies of E. coli resistant to multiple antibiotics except Zosyn.  Completed course of Zosyn during hospitalization.   Abnormal EKG with ST depression On admission.  No previous EKG. Admitting physician discussed with cardiology and recommended monitoring.  Patient did have tachycardia.  2D echocardiogram with LV function of 50 to 55% with no regional wall motion abnormality. May need outpatient follow-up with cardiology.   Hypernatremia -- resolved   Hypomagnesemia -- resolved        Subjective:  Feels better  Ate [employer] chicken last night Reports is eating better  Physical Exam: Vitals:   04/03/23 1214 04/03/23 2111 04/04/23 0658 04/04/23 0709  BP:  (!) 130/95 118/62 135/71   Pulse: 94 92 (!) 103   Resp: 18 18 18    Temp: 98 F (36.7 C) 98.1 F (36.7 C) 97.8 F (36.6 C)   TempSrc: Axillary Oral Oral   SpO2: 100% 100% 100%   Weight:    105.6 kg  Height:       Physical Exam Vitals reviewed.  Constitutional:      General: She is not in acute distress.    Appearance: She is not ill-appearing or toxic-appearing.  Cardiovascular:     Rate and Rhythm: Normal rate and regular rhythm.     Heart sounds: No murmur heard. Pulmonary:     Effort: Pulmonary effort is normal. No respiratory distress.     Breath sounds: No wheezing, rhonchi or rales.  Neurological:     Mental Status: She is alert.  Psychiatric:        Mood and Affect: Mood normal.        Behavior: Behavior normal.     Data Reviewed: CBG noted and detailed above BMP unremarkable with mild hyponatremia 133.  Potassium within normal limits.  Family Communication: none  Disposition: Status is: Inpatient Remains inpatient appropriate because: hypoglycemia      Time spent: 20 minutes  Author: Brendia Sacks, MD 04/04/2023 9:13 AM  For on call review [email or web address].

## 2023-04-04 NOTE — Progress Notes (Signed)
Progress Note  14 Days Post-Op  Subjective: Pt with some low blood sugars but overall improving. Denies pain or nausea or vomiting. Having bowel function.   Objective: Vital signs in last 24 hours: Temp:  [97.8 F (36.6 C)-98.1 F (36.7 C)] 97.8 F (36.6 C) (12/09 0658) Pulse Rate:  [92-103] 103 (12/09 0658) Resp:  [18] 18 (12/09 0658) BP: (118-135)/(62-95) 135/71 (12/09 0658) SpO2:  [100 %] 100 % (12/09 0658) Weight:  [105.6 kg] 105.6 kg (12/09 0709) Last BM Date : 04/02/23  Intake/Output from previous day: 12/08 0701 - 12/09 0700 In: 90  Out: 0  Intake/Output this shift: No intake/output data recorded.  PE: General: pleasant, WD, obese female who is laying in bed in NAD Lungs: Respiratory effort nonlabored Abd: soft, appropriately ttp, G tube with TF running @ 30cc/h, midline wound C/D/I     Lab Results:  No results for input(s): "WBC", "HGB", "HCT", "PLT" in the last 72 hours.  BMET Recent Labs    04/03/23 0423 04/04/23 0427  NA 136 133*  K 4.5 3.9  CL 109 105  CO2 20* 21*  GLUCOSE 89 89  BUN 10 12  CREATININE 0.66 0.61  CALCIUM 8.0* 7.9*   PT/INR No results for input(s): "LABPROT", "INR" in the last 72 hours. CMP     Component Value Date/Time   NA 133 (L) 04/04/2023 0427   K 3.9 04/04/2023 0427   CL 105 04/04/2023 0427   CO2 21 (L) 04/04/2023 0427   GLUCOSE 89 04/04/2023 0427   BUN 12 04/04/2023 0427   CREATININE 0.61 04/04/2023 0427   CREATININE 0.91 07/26/2017 1127   CALCIUM 7.9 (L) 04/04/2023 0427   PROT 4.7 (L) 03/27/2023 0242   ALBUMIN 1.7 (L) 03/27/2023 0242   AST 27 03/27/2023 0242   AST 15 07/26/2017 1127   ALT 22 03/27/2023 0242   ALT 14 07/26/2017 1127   ALKPHOS 164 (H) 03/27/2023 0242   BILITOT 0.7 03/27/2023 0242   BILITOT 0.5 07/26/2017 1127   GFRNONAA >60 04/04/2023 0427   GFRNONAA >60 07/26/2017 1127   GFRAA >60 02/28/2019 1022   GFRAA >60 07/26/2017 1127   Lipase     Component Value Date/Time   LIPASE 55 (H)  03/21/2023 0715       Studies/Results: No results found.  Anti-infectives: Anti-infectives (From admission, onward)    Start     Dose/Rate Route Frequency Ordered Stop   03/21/23 1330  piperacillin-tazobactam (ZOSYN) IVPB 3.375 g  Status:  Discontinued        3.375 g 12.5 mL/hr over 240 Minutes Intravenous Every 8 hours 03/21/23 1325 03/28/23 1021        Assessment/Plan  Perforated marginal ulcer POD#14 - status post ex lap, abdominal washout, Graham patch repair of perforated gastrojejunal ulcer, drain placement  Dr. Freida Busman - WBC normalized and afebrile - zosyn stopped 12/2 - BID PPI and carafate - H. Pylori negative  - midline clean - continue daily dressing changes - mobilize as able - hypoglycemia improving on TF via gastrostomy in excluded stomach - discussed with bariatric surgeon and he would not recommend cycling TF or bolus TF at this time  - recheck lytes this AM - overall nearing surgical readiness for DC to SNF, follow up in AVS already  FEN: reg diet, TF via gastrostomy @ 30 cc/h VTE: LMWH ID: Zosyn 11/25>12/2  LOS: 14 days     Juliet Rude, [facility] 04/04/2023, 10:51 AM Please see Amion for pager  number during day hours 7:00am-4:30pm

## 2023-04-04 NOTE — Plan of Care (Signed)

## 2023-04-05 DIAGNOSIS — E162 Hypoglycemia, unspecified: Secondary | ICD-10-CM | POA: Diagnosis not present

## 2023-04-05 DIAGNOSIS — K631 Perforation of intestine (nontraumatic): Secondary | ICD-10-CM | POA: Diagnosis not present

## 2023-04-05 LAB — GLUCOSE, CAPILLARY
Glucose-Capillary: 103 mg/dL — ABNORMAL HIGH (ref 70–99)
Glucose-Capillary: 67 mg/dL — ABNORMAL LOW (ref 70–99)
Glucose-Capillary: 73 mg/dL (ref 70–99)
Glucose-Capillary: 73 mg/dL (ref 70–99)
Glucose-Capillary: 74 mg/dL (ref 70–99)
Glucose-Capillary: 80 mg/dL (ref 70–99)
Glucose-Capillary: 83 mg/dL (ref 70–99)

## 2023-04-05 LAB — BASIC METABOLIC PANEL
Anion gap: 7 (ref 5–15)
BUN: 13 mg/dL (ref 8–23)
CO2: 20 mmol/L — ABNORMAL LOW (ref 22–32)
Calcium: 7.8 mg/dL — ABNORMAL LOW (ref 8.9–10.3)
Chloride: 108 mmol/L (ref 98–111)
Creatinine, Ser: 0.48 mg/dL (ref 0.44–1.00)
GFR, Estimated: >60 mL/min (ref >60)
Glucose, Bld: 84 mg/dL (ref 70–99)
Potassium: 4 mmol/L (ref 3.5–5.1)
Sodium: 135 mmol/L (ref 135–145)

## 2023-04-05 LAB — MAGNESIUM: Magnesium: 1.8 mg/dL (ref 1.7–2.4)

## 2023-04-05 LAB — PHOSPHORUS: Phosphorus: 3.5 mg/dL (ref 2.5–4.6)

## 2023-04-05 MED ORDER — GLUCERNA SHAKE PO LIQD
237.0000 mL | ORAL | Status: DC
  Filled 2023-04-05: qty 237

## 2023-04-05 MED ORDER — BOOST / RESOURCE BREEZE PO LIQD CUSTOM
1.0000 | Freq: Two times a day (BID) | ORAL | Status: DC
  Administered 2023-04-05: 100 mL via ORAL

## 2023-04-05 MED ORDER — COPPER 2 MG PO TABS
2.0000 mg | Freq: Every day | Status: DC
  Administered 2023-04-05 – 2023-04-06 (×2): 2 mg via ORAL
  Filled 2023-04-05 (×2): qty 1

## 2023-04-05 MED ORDER — JEVITY 1.2 CAL PO LIQD
720.0000 mL | ORAL | Status: DC
  Administered 2023-04-05 (×2): 720 mL

## 2023-04-05 MED ORDER — BACITRACIN 500 UNIT/GM EX OINT
1.0000 | TOPICAL_OINTMENT | Freq: Two times a day (BID) | CUTANEOUS | Status: DC
  Administered 2023-04-05 – 2023-04-06 (×2): 1 via TOPICAL
  Filled 2023-04-05: qty 0.9

## 2023-04-05 NOTE — Progress Notes (Signed)
Progress Note  15 Days Post-Op  Subjective: Pt with a few blood sugars in the 60s but overall much better. Tolerating PO and having bowel function, tolerating TF as well. I spoke with her sister on the phone as well in patient's room.   Objective: Vital signs in last 24 hours: Temp:  [98.2 F (36.8 C)] 98.2 F (36.8 C) (12/09 1214) Pulse Rate:  [98-106] 106 (12/09 1955) Resp:  [16-20] 20 (12/09 1955) BP: (130-140)/(65-74) 130/65 (12/09 1955) SpO2:  [100 %] 100 % (12/09 1955) Weight:  [107.4 kg] 107.4 kg (12/10 0450) Last BM Date : 04/02/23  Intake/Output from previous day: 12/09 0701 - 12/10 0700 In: -  Out: 450 [Urine:450] Intake/Output this shift: No intake/output data recorded.  PE: General: pleasant, WD, obese female who is laying in bed in NAD Lungs: Respiratory effort nonlabored Abd: soft, appropriately ttp, G tube with TF running @ 30cc/h, midline wound C/D/I     Lab Results:  No results for input(s): "WBC", "HGB", "HCT", "PLT" in the last 72 hours.  BMET Recent Labs    04/04/23 1207 04/05/23 0402  NA 135 135  K 4.1 4.0  CL 107 108  CO2 21* 20*  GLUCOSE 98 84  BUN 13 13  CREATININE 0.63 0.48  CALCIUM 8.1* 7.8*   PT/INR No results for input(s): "LABPROT", "INR" in the last 72 hours. CMP     Component Value Date/Time   NA 135 04/05/2023 0402   K 4.0 04/05/2023 0402   CL 108 04/05/2023 0402   CO2 20 (L) 04/05/2023 0402   GLUCOSE 84 04/05/2023 0402   BUN 13 04/05/2023 0402   CREATININE 0.48 04/05/2023 0402   CREATININE 0.91 07/26/2017 1127   CALCIUM 7.8 (L) 04/05/2023 0402   PROT 4.7 (L) 03/27/2023 0242   ALBUMIN 1.7 (L) 03/27/2023 0242   AST 27 03/27/2023 0242   AST 15 07/26/2017 1127   ALT 22 03/27/2023 0242   ALT 14 07/26/2017 1127   ALKPHOS 164 (H) 03/27/2023 0242   BILITOT 0.7 03/27/2023 0242   BILITOT 0.5 07/26/2017 1127   GFRNONAA >60 04/05/2023 0402   GFRNONAA >60 07/26/2017 1127   GFRAA >60 02/28/2019 1022   GFRAA >60  07/26/2017 1127   Lipase     Component Value Date/Time   LIPASE 55 (H) 03/21/2023 0715       Studies/Results: No results found.  Anti-infectives: Anti-infectives (From admission, onward)    Start     Dose/Rate Route Frequency Ordered Stop   03/21/23 1330  piperacillin-tazobactam (ZOSYN) IVPB 3.375 g  Status:  Discontinued        3.375 g 12.5 mL/hr over 240 Minutes Intravenous Every 8 hours 03/21/23 1325 03/28/23 1021        Assessment/Plan  Perforated marginal ulcer POD#15 - status post ex lap, abdominal washout, Graham patch repair of perforated gastrojejunal ulcer, drain placement  Dr. Freida Busman - WBC normalized and afebrile - zosyn stopped 12/2 - BID PPI and carafate - H. Pylori negative  - midline clean - continue daily dressing changes - mobilize as able - hypoglycemia improving on TF via gastrostomy in excluded stomach - discussed with bariatric surgeon and he would not recommend cycling TF or bolus TF at this time  - will discuss maybe small increase in TF to try and further decrease hypoglycemic episodes  - ok for DC from surgery standpoint in the next 24 hrs if hypoglycemia stable/improving   FEN: reg diet, TF via gastrostomy @ 30 cc/h VTE:  LMWH ID: Zosyn 11/25>12/2  LOS: 15 days     Juliet Rude, [facility] 04/05/2023, 9:20 AM Please see Amion for pager number during day hours 7:00am-4:30pm

## 2023-04-05 NOTE — Progress Notes (Signed)
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  Patient found to have intra-abdominal free air and fluid and concern for possible GJ anastomosis ulceration resulting in perforation.  Patient also noted to have significant abdominal pain and vomiting for 3 days prior to admission.  Patient was seen by general surgery and underwent  exploratory laparotomy, abdominal washout, Graham patch repair of perforated gastrojejunal ulcer, intraperitoneal drain placement.  Patient subsequently admitted to the stepdown unit for closer monitoring since hypotensive and also in acute renal failure.  Patient was then transferred out of the stepdown unit.  Eventually transferred to floor.  Main issue prolonging hospitalization has been hypoglycemia which required initiation of tube feeds.  Hypoglycemia appears to be stabilizing and even resolving.  Surgery is adjusting tube feeds.  Plan is skilled nursing facility on discharge when okay with general surgery, likely soon.  Assessment and Plan: Perforated marginal ulcer Likely secondary to G-tube displacement versus GJ anastomosis ulceration.   Status-post ex lap, abdominal washout, Graham patch repair of perforated gastrojejunal ulcer, intraperitoneal drain placement on 03/21/2023.   Continue oral diet and TF. Barrier to discharge is hypoglycemia, (improving and she is nearing readiness for discharge).   Hypoglycemia  Failure to thrive with poor oral intake Appreciate surgical intervention, started tube feeds 12/5. Hypoglycemia events decreasing in severity: Last 24 hours 66, 67.    Surgery adjusting tube feeds.   GERD Continue PPI.   Normocytic anemia Ferritin of 229, serum iron low at 11 TIBC low, folic acid normal at 19, vitamin B12 elevated at 1005. Hgb baseline 8-9 from May of this year Hgb stable, follow-up as an outpatient.   Bilateral medial aspect of leg tenderness   Has a chronic venous stasis.  Pitting edema noted with some tenderness, chronic per patient Continue oral Lasix   Debility, deconditioning   PT OT recommended skilled nursing facility on discharge. Needs to be up to chair with meals, and mobilized multiple times per day.   Morbid obesity Body mass index is 44.32 kg/m   Hypokalemia, resolved. Monitor for refeeding syndrome.  No evidence.   Metabolic encephalopathy -- resolved   Likely multifactorial from surgery, IV narcotics and renal failure and hypoglycemia.     Hypotension -- resolved   AKI -- resolved Mild NAGMA Baseline creatinine around 0.7. Peaked at 2.11 this admission. Back to baseline.   Hypokalemia -- resolved   Probable UTI -- resolved Urine culture with 60,000 colonies of E. coli resistant to multiple antibiotics except Zosyn.  Completed course of Zosyn during hospitalization.   Abnormal EKG with ST depression On admission.  No previous EKG. Admitting physician discussed with cardiology and recommended monitoring.  Patient did have tachycardia.  2D echocardiogram with LV function of 50 to 55% with no regional wall motion abnormality. May need outpatient follow-up with cardiology.   Hypernatremia -- resolved   Hypomagnesemia -- resolved      Subjective:  Feels ok Eating better  Physical Exam: Vitals:   04/04/23 1214 04/04/23 1955 04/05/23 0450 04/05/23 1224  BP: (!) 140/74 130/65  (!) 128/54  Pulse: 98 (!) 106  98  Resp: 16 20  18   Temp:  98.2 F (36.8 C)   97.8 F (36.6 C)  TempSrc:      SpO2: 100% 100%  100%  Weight:   107.4 kg   Height:       Physical Exam Vitals reviewed.   Constitutional:      General: She is not in acute distress.    Appearance: She is not ill-appearing or toxic-appearing.  Cardiovascular:     Rate and Rhythm: Normal rate and regular rhythm.     Heart sounds: No murmur heard. Pulmonary:     Effort: Pulmonary effort is normal. No respiratory distress.     Breath sounds: No wheezing, rhonchi or rales.  Neurological:     Mental Status: She is alert.  Psychiatric:        Mood and Affect: Mood normal.        Behavior: Behavior normal.     Data Reviewed: Only 2 hypoglycemic episodes with, 66, 67. BMP noted, magnesium and phosphorus within normal limits.  Family Communication: none  Disposition: Status is: Inpatient Remains inpatient appropriate because: hypoglycemia     Time spent: 20 minutes  Author: Brendia Sacks, MD 04/05/2023 5:48 PM  For on call review [email or web address].

## 2023-04-05 NOTE — Plan of Care (Signed)

## 2023-04-05 NOTE — Progress Notes (Signed)
Nutrition Follow-up  DOCUMENTATION CODES:   Morbid obesity  INTERVENTION:  - New tube feeding regimen via Gastrostomy: Jevity 1.2 at 45 ml/h (1080 ml per day) *Start at 58mL/hr and advance by 10mL Q4H Provides 1296 kcal, 60 gm protein, 871 ml free water daily   - Regular diet per Surgery.  - Continue Glucerna Shake po BID, each supplement provides 220 kcal and 10 grams of protein - Add Boost Breeze po BID, each supplement provides 250 kcal and 9 grams of protein.  - Continue daily Multivitamin with minerals daily, vitamin D, vitamin C, and folic acid supplementation.          - Checking zinc and copper labs to determine need for ongoing zinc supplementation and as unsure how long patient was receiving zinc supplementation as prolonged supplementation can result in copper deficiency.   - Zinc: WNL, discontinue supplementation  - Copper: low, supplement 2mg  daily x30 days then recheck   - Monitor weight trends.  NUTRITION DIAGNOSIS:   Altered nutrition lab value related to chronic illness (gastric bypass with dumping syndrome) as evidenced by other (comment) (hypoglycemia with need to start tube feeds). *ongoing  GOAL:   Patient will meet greater than or equal to 90% of their needs *progressing, on TF and oral diet  MONITOR:   PO intake, Supplement acceptance, Labs, Weight trends, TF tolerance  REASON FOR ASSESSMENT:   Consult Enteral/tube feeding initiation and management  ASSESSMENT:   72 year old female PMH of HTN, HLD, OSA, GERD, morbid obesity, esophageal dysmotility with gastric bypass May 2024 followed by failure to thrive with G-tube placement on 01/2023 who presented to the hospital with abdominal pain. Admitted for perforated marginal ulcer.  11/25 Admit; s/p ex-lap, abdominal washout, Graham patch repair of perforated gastrojejunal ulcer  11/30 Bariatric clear liquid diet 12/1 Bariatric full liquid diet 12/2 Bariatric advanced diet 12/4 Regular  diet  Patient reports she is eating about 2 meals a day. Still dealing with early satiety but does not feel the tube feeds have made this any worse. Appetite normal, also doesn't feel tube feeds are affecting this at all.  She usually sips on Glucerna during the day but  notes she is getting somewhat tired of it. Agreeable to also try Boost Breeze for another option.  Encouraged patient to continue small and frequent meals and snacks in addition to supplements to aid in more stable blood glucose.   Tube feeds are going well, patient denies any issues.  Surgery inquiring about increasing tube feeds due to continued low blood sugars. Will plan to increase tube feeds to 28mL/hr which will meet ~75% of estimated needs. Discussed change with patient as well.    Admit weight: 232# Current weight:  236# I&O's: +12.6L + mild generalized  Medications reviewed and include: 500mg  vitamin C, 5000 units vitamin D, 1mg  folic acid, MVI, 220mg  zinc  Labs reviewed:  HA1C 6.3 Blood Glucose 66-93 x24 hours   Diet Order:   Diet Order             Diet regular Room service appropriate? Yes; Fluid consistency: Thin  Diet effective now                   EDUCATION NEEDS:  Education needs have been addressed  Skin:  Skin Assessment: Reviewed RN Assessment  Last BM:  12/7  Height:  Ht Readings from Last 1 Encounters:  03/23/23 5\' 1"  (1.549 m)   Weight:  Wt Readings from Last 1 Encounters:  04/05/23  107.4 kg   Ideal Body Weight:  47.73 kg  BMI:  Body mass index is 44.74 kg/m.  Estimated Nutritional Needs:  Kcal:  1500-1800 kcals Protein:  80-105 grams Fluid:  >/= 1.5L    Shelle Iron RD, LDN Contact via Secure Chat.

## 2023-04-06 DIAGNOSIS — I82401 Acute embolism and thrombosis of unspecified deep veins of right lower extremity: Secondary | ICD-10-CM | POA: Diagnosis not present

## 2023-04-06 DIAGNOSIS — Z8249 Family history of ischemic heart disease and other diseases of the circulatory system: Secondary | ICD-10-CM | POA: Diagnosis not present

## 2023-04-06 DIAGNOSIS — E669 Obesity, unspecified: Secondary | ICD-10-CM | POA: Diagnosis not present

## 2023-04-06 DIAGNOSIS — K219 Gastro-esophageal reflux disease without esophagitis: Secondary | ICD-10-CM | POA: Diagnosis not present

## 2023-04-06 DIAGNOSIS — R5381 Other malaise: Secondary | ICD-10-CM | POA: Diagnosis not present

## 2023-04-06 DIAGNOSIS — Z6841 Body Mass Index (BMI) 40.0 and over, adult: Secondary | ICD-10-CM | POA: Diagnosis not present

## 2023-04-06 DIAGNOSIS — D649 Anemia, unspecified: Secondary | ICD-10-CM | POA: Diagnosis present

## 2023-04-06 DIAGNOSIS — F411 Generalized anxiety disorder: Secondary | ICD-10-CM | POA: Diagnosis not present

## 2023-04-06 DIAGNOSIS — R531 Weakness: Secondary | ICD-10-CM | POA: Diagnosis not present

## 2023-04-06 DIAGNOSIS — G9341 Metabolic encephalopathy: Secondary | ICD-10-CM | POA: Diagnosis not present

## 2023-04-06 DIAGNOSIS — M109 Gout, unspecified: Secondary | ICD-10-CM | POA: Diagnosis not present

## 2023-04-06 DIAGNOSIS — K2970 Gastritis, unspecified, without bleeding: Secondary | ICD-10-CM | POA: Diagnosis not present

## 2023-04-06 DIAGNOSIS — I82412 Acute embolism and thrombosis of left femoral vein: Secondary | ICD-10-CM | POA: Diagnosis present

## 2023-04-06 DIAGNOSIS — Z882 Allergy status to sulfonamides status: Secondary | ICD-10-CM | POA: Diagnosis not present

## 2023-04-06 DIAGNOSIS — K911 Postgastric surgery syndromes: Secondary | ICD-10-CM | POA: Diagnosis present

## 2023-04-06 DIAGNOSIS — E162 Hypoglycemia, unspecified: Secondary | ICD-10-CM | POA: Diagnosis not present

## 2023-04-06 DIAGNOSIS — G4733 Obstructive sleep apnea (adult) (pediatric): Secondary | ICD-10-CM | POA: Diagnosis present

## 2023-04-06 DIAGNOSIS — Z87891 Personal history of nicotine dependence: Secondary | ICD-10-CM | POA: Diagnosis not present

## 2023-04-06 DIAGNOSIS — Z7401 Bed confinement status: Secondary | ICD-10-CM | POA: Diagnosis not present

## 2023-04-06 DIAGNOSIS — E785 Hyperlipidemia, unspecified: Secondary | ICD-10-CM | POA: Diagnosis present

## 2023-04-06 DIAGNOSIS — R627 Adult failure to thrive: Secondary | ICD-10-CM | POA: Diagnosis not present

## 2023-04-06 DIAGNOSIS — Z741 Need for assistance with personal care: Secondary | ICD-10-CM | POA: Diagnosis not present

## 2023-04-06 DIAGNOSIS — Z993 Dependence on wheelchair: Secondary | ICD-10-CM | POA: Diagnosis not present

## 2023-04-06 DIAGNOSIS — R100 Acute abdomen: Secondary | ICD-10-CM | POA: Diagnosis not present

## 2023-04-06 DIAGNOSIS — D638 Anemia in other chronic diseases classified elsewhere: Secondary | ICD-10-CM | POA: Diagnosis not present

## 2023-04-06 DIAGNOSIS — N3281 Overactive bladder: Secondary | ICD-10-CM | POA: Diagnosis not present

## 2023-04-06 DIAGNOSIS — Z4889 Encounter for other specified surgical aftercare: Secondary | ICD-10-CM | POA: Diagnosis not present

## 2023-04-06 DIAGNOSIS — T85528A Displacement of other gastrointestinal prosthetic devices, implants and grafts, initial encounter: Secondary | ICD-10-CM | POA: Diagnosis not present

## 2023-04-06 DIAGNOSIS — I824Y9 Acute embolism and thrombosis of unspecified deep veins of unspecified proximal lower extremity: Secondary | ICD-10-CM | POA: Diagnosis not present

## 2023-04-06 DIAGNOSIS — Z743 Need for continuous supervision: Secondary | ICD-10-CM | POA: Diagnosis not present

## 2023-04-06 DIAGNOSIS — Z66 Do not resuscitate: Secondary | ICD-10-CM | POA: Diagnosis present

## 2023-04-06 DIAGNOSIS — E43 Unspecified severe protein-calorie malnutrition: Secondary | ICD-10-CM | POA: Diagnosis not present

## 2023-04-06 DIAGNOSIS — Z79899 Other long term (current) drug therapy: Secondary | ICD-10-CM | POA: Diagnosis not present

## 2023-04-06 DIAGNOSIS — R1084 Generalized abdominal pain: Secondary | ICD-10-CM | POA: Diagnosis not present

## 2023-04-06 DIAGNOSIS — K9423 Gastrostomy malfunction: Secondary | ICD-10-CM | POA: Diagnosis not present

## 2023-04-06 DIAGNOSIS — E8809 Other disorders of plasma-protein metabolism, not elsewhere classified: Secondary | ICD-10-CM | POA: Diagnosis present

## 2023-04-06 DIAGNOSIS — R1314 Dysphagia, pharyngoesophageal phase: Secondary | ICD-10-CM | POA: Diagnosis not present

## 2023-04-06 DIAGNOSIS — G629 Polyneuropathy, unspecified: Secondary | ICD-10-CM | POA: Diagnosis not present

## 2023-04-06 DIAGNOSIS — E872 Acidosis, unspecified: Secondary | ICD-10-CM | POA: Diagnosis present

## 2023-04-06 DIAGNOSIS — Z431 Encounter for attention to gastrostomy: Secondary | ICD-10-CM | POA: Diagnosis not present

## 2023-04-06 DIAGNOSIS — R109 Unspecified abdominal pain: Secondary | ICD-10-CM | POA: Diagnosis not present

## 2023-04-06 DIAGNOSIS — E860 Dehydration: Secondary | ICD-10-CM | POA: Diagnosis present

## 2023-04-06 DIAGNOSIS — M6259 Muscle wasting and atrophy, not elsewhere classified, multiple sites: Secondary | ICD-10-CM | POA: Diagnosis not present

## 2023-04-06 DIAGNOSIS — I10 Essential (primary) hypertension: Secondary | ICD-10-CM | POA: Diagnosis not present

## 2023-04-06 DIAGNOSIS — K631 Perforation of intestine (nontraumatic): Secondary | ICD-10-CM | POA: Diagnosis not present

## 2023-04-06 DIAGNOSIS — M150 Primary generalized (osteo)arthritis: Secondary | ICD-10-CM | POA: Diagnosis not present

## 2023-04-06 DIAGNOSIS — I2510 Atherosclerotic heart disease of native coronary artery without angina pectoris: Secondary | ICD-10-CM | POA: Diagnosis present

## 2023-04-06 DIAGNOSIS — N3000 Acute cystitis without hematuria: Secondary | ICD-10-CM | POA: Diagnosis not present

## 2023-04-06 DIAGNOSIS — M6281 Muscle weakness (generalized): Secondary | ICD-10-CM | POA: Diagnosis not present

## 2023-04-06 DIAGNOSIS — K275 Chronic or unspecified peptic ulcer, site unspecified, with perforation: Secondary | ICD-10-CM | POA: Diagnosis not present

## 2023-04-06 DIAGNOSIS — R110 Nausea: Secondary | ICD-10-CM | POA: Diagnosis not present

## 2023-04-06 DIAGNOSIS — K760 Fatty (change of) liver, not elsewhere classified: Secondary | ICD-10-CM | POA: Diagnosis not present

## 2023-04-06 LAB — GLUCOSE, CAPILLARY
Glucose-Capillary: 83 mg/dL (ref 70–99)
Glucose-Capillary: 83 mg/dL (ref 70–99)
Glucose-Capillary: 100 mg/dL — ABNORMAL HIGH (ref 70–99)
Glucose-Capillary: 92 mg/dL (ref 70–99)

## 2023-04-06 MED ORDER — GLUCERNA SHAKE PO LIQD: 237.0000 mL | ORAL | Status: DC

## 2023-04-06 MED ORDER — VITAMIN B-1 100 MG PO TABS: 100.0000 mg | ORAL_TABLET | Freq: Every day | ORAL | Status: DC

## 2023-04-06 MED ORDER — BACITRACIN 500 UNIT/GM EX OINT: 1.0000 | TOPICAL_OINTMENT | Freq: Two times a day (BID) | CUTANEOUS | Status: DC

## 2023-04-06 MED ORDER — UNABLE TO FIND: Status: DC

## 2023-04-06 MED ORDER — OMEPRAZOLE 20 MG PO CPDR: 20.0000 mg | DELAYED_RELEASE_CAPSULE | Freq: Two times a day (BID) | ORAL | Status: DC

## 2023-04-06 MED ORDER — COPPER 2 MG PO TABS: 2.0000 mg | Freq: Every day | Status: DC

## 2023-04-06 MED ORDER — FUROSEMIDE 20 MG PO TABS: 20.0000 mg | ORAL_TABLET | Freq: Every day | ORAL | Status: DC

## 2023-04-06 MED ORDER — JEVITY 1.2 CAL PO LIQD: 720.0000 mL | ORAL | Status: DC

## 2023-04-06 MED ORDER — SUCRALFATE 1 GM/10ML PO SUSP: 1.0000 g | Freq: Three times a day (TID) | ORAL | Status: DC

## 2023-04-06 MED ORDER — VITAMIN D3 25 MCG PO TABS: 5000.0000 [IU] | ORAL_TABLET | Freq: Every day | ORAL | Status: DC

## 2023-04-06 NOTE — [place] FL2 (Signed)
[place] MEDICAID FL2 LEVEL OF CARE FORM     IDENTIFICATION  Patient Name: Christine [name] Birthdate: 23-May-1950 Sex: female Admission Date (Current Location): 03/21/2023  [facility] and [place] Number:  Producer, television/film/video and Address:  [facility] [place] [facility], [address] [place], [place] [address]      Provider Number: [id number]  Attending Physician Name and Address:  Elease Etienne, MD  Relative Name and Phone Number:  PATTI, SYLVEST (Sister)  [phone] [facility])    Current Level of Care: Hospital Recommended Level of Care: Skilled Nursing Facility Prior Approval Number:    Date Approved/Denied:   PASRR Number:    Discharge Plan: SNF    Current Diagnoses: Patient Active Problem List   Diagnosis Date Noted   Hypotension 03/22/2023   Metabolic acidosis 03/22/2023   Bowel perforation (HCC) 03/21/2023   Pressure injury of skin 01/29/2023   Esophageal dysphagia 01/15/2023   Abnormal esophagram 01/15/2023   Hypoglycemia 01/15/2023   Hypernatremia 01/08/2023   Severe dehydration 01/08/2023   Altered mental status 01/08/2023   Failure to thrive in adult 01/08/2023   AKI (acute kidney injury) (HCC) 01/08/2023   Gallbladder dilatation 01/08/2023   SIRS (systemic inflammatory response syndrome) (HCC) 11/09/2022   Sepsis (HCC) 11/09/2022   Acute metabolic encephalopathy 11/09/2022   Renal insufficiency 11/09/2022   Hypokalemia 11/09/2022   Normocytic anemia 11/09/2022   Morbid obesity with BMI of 50.0-59.9, adult (HCC) 09/14/2022   Gastric bypass status for obesity 09/14/2022   Intermittent exophthalmos of right eye 06/09/2022   Gastric polyp 04/08/2022   Hyperlipidemia 05/20/2021   Lumbar radiculopathy 12/24/2020   Trigger little finger of left hand 12/16/2020   Medial epicondylitis of elbow, left 12/16/2020   Memory difficulties 12/11/2020   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   Globus sensation 11/15/2019    Gastroesophageal reflux disease 11/15/2019   Laryngopharyngeal reflux (LPR) 11/15/2019   Throat clearing 10/05/2019   Gout 08/31/2019   Phlebitis 08/31/2019   Prediabetes 08/31/2019   OA (osteoarthritis) of knee 05/17/2019   Acute pain of right knee 05/17/2019   Abdominal pain 03/31/2019   Morbid obesity (HCC) 03/29/2018   GAD (generalized anxiety disorder) 02/27/2018   Mixed incontinence urge and stress 02/27/2018   OSA on CPAP 06/16/2017   Acute appendicitis 05/06/2016   Gastro-esophageal reflux disease with esophagitis 07/26/2011   Essential hypertension 07/26/2011   Osteoarthrosis 10/16/2009   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, including shoulder 08/22/2008   Pre-operative cardiovascular examination 08/22/2008   Anxiety 04/27/2003    Orientation RESPIRATION BLADDER Height & Weight     Self, Time, Situation, Place  Normal External catheter Weight: 233 lb 7.5 oz (105.9 kg) Height:  5\' 1"  (154.9 cm)  BEHAVIORAL SYMPTOMS/MOOD NEUROLOGICAL BOWEL NUTRITION STATUS      Incontinent Feeding tube, Diet (see d/c summary)  AMBULATORY STATUS COMMUNICATION OF NEEDS Skin   Extensive Assist Verbally Other (Comment)                       Personal Care Assistance Level of Assistance  Bathing, Dressing, Feeding Bathing Assistance: Maximum assistance Feeding assistance: Limited assistance Dressing Assistance: Limited assistance     Functional Limitations Info  Sight, Speech, Hearing Sight Info: Impaired Hearing Info: Adequate Speech Info: Adequate    SPECIAL CARE FACTORS FREQUENCY                       Contractures  Contractures Info: Not present    Additional Factors Info  Code Status, Allergies Code Status Info: DNR Allergies Info: prednisone, tape, Sulfa Antibiotics           Current Medications (04/06/2023):  This is the current hospital active medication list Current Facility-Administered Medications  Medication Dose  Route Frequency Provider Last Rate Last Admin   acetaminophen (TYLENOL) tablet 650 mg  650 mg Oral Q6H PRN Juliet Rude, PA-C   650 mg at 04/01/23 1400   albuterol (PROVENTIL) (2.5 MG/3ML) 0.083% nebulizer solution 2.5 mg  2.5 mg Nebulization Q2H PRN Kirby Crigler, Mir M, MD       ascorbic acid (VITAMIN C) tablet 500 mg  500 mg Per Tube QHS Standley Brooking, MD   500 mg at 04/05/23 2216   bacitracin ointment 1 Application  1 Application Topical BID Standley Brooking, MD   1 Application at 04/06/23 5621   Chlorhexidine Gluconate Cloth 2 % PADS 6 each  6 each Topical Daily Rodolph Bong, MD   6 each at 04/05/23 2216   cholecalciferol (VITAMIN D3) 25 MCG (1000 UNIT) tablet 5,000 Units  5,000 Units Per Tube QHS Standley Brooking, MD   5,000 Units at 04/05/23 2216   copper tablet 2 mg  2 mg Oral Daily Standley Brooking, MD   2 mg at 04/06/23 0947   DULoxetine (CYMBALTA) DR capsule 60 mg  60 mg Oral Daily Standley Brooking, MD   60 mg at 04/06/23 0948   enoxaparin (LOVENOX) injection 40 mg  40 mg Subcutaneous Q24H Danford Bad, RPH   40 mg at 04/05/23 1303   feeding supplement (BOOST / RESOURCE BREEZE) liquid 1 Container  1 Container Oral BID BM Standley Brooking, MD   100 mL at 04/05/23 1802   feeding supplement (GLUCERNA SHAKE) (GLUCERNA SHAKE) liquid 237 mL  237 mL Oral Q24H Standley Brooking, MD       feeding supplement (JEVITY 1.2 CAL) liquid 720 mL  720 mL Per Tube Continuous Juliet Rude, PA-C 45 mL/hr at 04/05/23 1618 720 mL at 04/05/23 1618   folic acid (FOLVITE) tablet 1 mg  1 mg Per Tube QHS Standley Brooking, MD   1 mg at 04/05/23 2216   furosemide (LASIX) tablet 20 mg  20 mg Oral Daily Standley Brooking, MD   20 mg at 04/06/23 0948   gabapentin (NEURONTIN) 250 MG/5ML solution 100 mg  100 mg Per Tube Q8H Standley Brooking, MD   100 mg at 04/06/23 3086   multivitamin with minerals tablet 1 tablet  1 tablet Per Tube QHS Standley Brooking, MD   1 tablet at 04/05/23 2216    omeprazole (PRILOSEC) capsule 20 mg  20 mg Oral BID Juliet Rude, PA-C   20 mg at 04/06/23 0948   ondansetron (ZOFRAN) injection 4 mg  4 mg Intravenous Q6H PRN Maryln Gottron, MD   4 mg at 03/27/23 1304   Oral care mouth rinse  15 mL Mouth Rinse PRN Standley Brooking, MD       promethazine (PHENERGAN) tablet 25 mg  25 mg Oral Q6H PRN Pokhrel, Laxman, MD       sucralfate (CARAFATE) 1 GM/10ML suspension 1 g  1 g Per Tube TID WC & HS Standley Brooking, MD   1 g at 04/06/23 1242   traMADol (ULTRAM) tablet 50 mg  50 mg Oral TID PRN Joycelyn Das, MD  Discharge Medications: Please see discharge summary for a list of discharge medications.  Relevant Imaging Results:  Relevant Lab Results:   Additional Information SSN: [id number] LCSW

## 2023-04-06 NOTE — TOC Progression Note (Signed)
Transition of Care [facility]) - Progression Note    Patient Details  Name: Christine [name] MRN: [id number] Date of Birth: July 13, 1950  [facility]) CM/SW Contact  Erin Sons, [place] Phone Number: 04/06/2023, 11:58 AM  Clinical Narrative:     CSW contacted [facility] admissions inquiring if pt can return on DC. Awaiting response  Expected Discharge Plan: Skilled Nursing Facility Barriers to Discharge: Continued Medical Work up  Expected Discharge Plan and Services In-house Referral: NA Discharge Planning Services: CM Consult Post Acute Care Choice: Skilled Nursing Facility Living arrangements for the past 2 months: Skilled Nursing Facility                 DME Arranged: N/A DME Agency: NA       HH Arranged: NA HH Agency: NA         Social Determinants of Health (SDOH) Interventions SDOH Screenings   Food Insecurity: No Food Insecurity (03/23/2023)  Housing: Patient Declined (03/23/2023)  Transportation Needs: No Transportation Needs (03/23/2023)  Utilities: Not At Risk (03/23/2023)  Alcohol Screen: Low Risk  (07/13/2022)  Depression (PHQ2-9): Low Risk  (01/21/2022)  Financial Resource Strain: Low Risk  (12/01/2022)   Received from [facility]  Physical Activity: Insufficiently Active (07/13/2022)  Social Connections: Moderately Integrated (12/01/2022)   Received from [facility]  Stress: No Stress Concern Present (07/13/2022)  Tobacco Use: Medium Risk (03/21/2023)  Health Literacy: Medium Risk (12/01/2022)   [facility]    Readmission Risk Interventions    03/23/2023   10:43 AM 01/09/2023   11:11 AM  Readmission Risk Prevention Plan  Transportation Screening Complete Complete  HRI or Home Care Consult  Complete  Social Work Consult for Recovery Care Planning/Counseling  Complete  Palliative Care Screening  Not Applicable  Medication Review Oceanographer) Complete Complete  PCP or Specialist appointment within 3-5 days of  discharge Complete   HRI or Home Care Consult Complete   SW Recovery Care/Counseling Consult Complete   Palliative Care Screening Not Applicable   Skilled Nursing Facility Complete

## 2023-04-06 NOTE — Plan of Care (Signed)

## 2023-04-06 NOTE — Progress Notes (Signed)
Progress Note  16 Days Post-Op  Subjective: Pt with improvement in hypoglycemia over last 24 hrs with increased rate of tube feeds. Mild drainage around gastrostomy but still tolerating feeds and having bowel function.   Objective: Vital signs in last 24 hours: Temp:  [97.8 F (36.6 C)-98.4 F (36.9 C)] 98.4 F (36.9 C) (12/11 0518) Pulse Rate:  [98-109] 108 (12/11 0518) Resp:  [18-20] 20 (12/11 0518) BP: (128-155)/(48-68) 140/48 (12/11 0518) SpO2:  [100 %] 100 % (12/11 0518) Weight:  [105.9 kg] 105.9 kg (12/11 0500) Last BM Date : 04/05/23  Intake/Output from previous day: 12/10 0701 - 12/11 0700 In: -  Out: 2800 [Urine:2800] Intake/Output this shift: No intake/output data recorded.  PE: General: pleasant, WD, obese female who is laying in bed in NAD Lungs: Respiratory effort nonlabored Abd: soft, appropriately ttp, G tube with TF running @ 45cc/h, midline wound C/D/I     Lab Results:  No results for input(s): "WBC", "HGB", "HCT", "PLT" in the last 72 hours.  BMET Recent Labs    04/04/23 1207 04/05/23 0402  NA 135 135  K 4.1 4.0  CL 107 108  CO2 21* 20*  GLUCOSE 98 84  BUN 13 13  CREATININE 0.63 0.48  CALCIUM 8.1* 7.8*   PT/INR No results for input(s): "LABPROT", "INR" in the last 72 hours. CMP     Component Value Date/Time   NA 135 04/05/2023 0402   K 4.0 04/05/2023 0402   CL 108 04/05/2023 0402   CO2 20 (L) 04/05/2023 0402   GLUCOSE 84 04/05/2023 0402   BUN 13 04/05/2023 0402   CREATININE 0.48 04/05/2023 0402   CREATININE 0.91 07/26/2017 1127   CALCIUM 7.8 (L) 04/05/2023 0402   PROT 4.7 (L) 03/27/2023 0242   ALBUMIN 1.7 (L) 03/27/2023 0242   AST 27 03/27/2023 0242   AST 15 07/26/2017 1127   ALT 22 03/27/2023 0242   ALT 14 07/26/2017 1127   ALKPHOS 164 (H) 03/27/2023 0242   BILITOT 0.7 03/27/2023 0242   BILITOT 0.5 07/26/2017 1127   GFRNONAA >60 04/05/2023 0402   GFRNONAA >60 07/26/2017 1127   GFRAA >60 02/28/2019 1022   GFRAA >60  07/26/2017 1127   Lipase     Component Value Date/Time   LIPASE 55 (H) 03/21/2023 0715       Studies/Results: No results found.  Anti-infectives: Anti-infectives (From admission, onward)    Start     Dose/Rate Route Frequency Ordered Stop   03/21/23 1330  piperacillin-tazobactam (ZOSYN) IVPB 3.375 g  Status:  Discontinued        3.375 g 12.5 mL/hr over 240 Minutes Intravenous Every 8 hours 03/21/23 1325 03/28/23 1021        Assessment/Plan  Perforated marginal ulcer POD#16 - status post ex lap, abdominal washout, Graham patch repair of perforated gastrojejunal ulcer, drain placement  Dr. Freida Busman - WBC normalized and afebrile - zosyn stopped 12/2 - BID PPI and carafate - H. Pylori negative  - midline clean - continue daily dressing changes - mobilize as able - hypoglycemia improved on current TF regimen, recommend continuation of continuous TF at discharge - stable for DC from surgical standpoint, follow up with Dr. Dossie Der in [facility] - general surgery will be available as needed but no other recommendations at this time   FEN: reg diet, TF via gastrostomy @ 45 cc/h VTE: LMWH ID: Zosyn 11/25>12/2  LOS: 16 days     Juliet Rude, [facility] 04/06/2023, 10:29 AM Please  see Amion for pager number during day hours 7:00am-4:30pm

## 2023-04-06 NOTE — TOC Transition Note (Signed)
Transition of Care [facility]) - CM/SW Discharge Note   Patient Details  Name: Christine [name] MRN: [id number] Date of Birth: 04-Aug-1950  Transition [facility] Care [facility]) CM/SW Contact:  Erin Sons, LCSW Phone Number: 04/06/2023, 1:41 PM   Clinical Narrative:     CSW confirmed with [facility] intake that pt is LTC with them and can return on DC.    Per MD patient ready for DC to [facility] RN, patient, patient's family, and facility notified of DC. Discharge Summary and FL2 sent to facility. RN to call report prior to discharge [phone]). DC packet on chart. Ambulance transport requested for patient.   CSW will sign off for now as social work intervention is no longer needed. Please consult [place] again if new needs arise.   Final next level of care: Skilled Nursing Facility Barriers to Discharge: No Barriers Identified   Patient Goals and CMS Choice CMS Medicare.gov Compare Post Acute Care list provided to::  (will be provided for sister after therapy assessment) Choice offered to / list presented to : Sibling  Discharge Placement                         Discharge Plan and Services Additional resources added to the After Visit Summary for   In-house Referral: NA Discharge Planning Services: CM Consult Post Acute Care Choice: Skilled Nursing Facility          DME Arranged: N/A DME Agency: NA       HH Arranged: NA HH Agency: NA        Social Determinants of Health (SDOH) Interventions SDOH Screenings   Food Insecurity: No Food Insecurity (03/23/2023)  Housing: Patient Declined (03/23/2023)  Transportation Needs: No Transportation Needs (03/23/2023)  Utilities: Not At Risk (03/23/2023)  Alcohol Screen: Low Risk  (07/13/2022)  Depression (PHQ2-9): Low Risk  (01/21/2022)  Financial Resource Strain: Low Risk  (12/01/2022)   Received from [facility]  Physical Activity: Insufficiently Active (07/13/2022)  Social Connections: Moderately Integrated (12/01/2022)    Received from [facility]  Stress: No Stress Concern Present (07/13/2022)  Tobacco Use: Medium Risk (03/21/2023)  Health Literacy: Medium Risk (12/01/2022)   [facility]     Readmission Risk Interventions    03/23/2023   10:43 AM 01/09/2023   11:11 AM  Readmission Risk Prevention Plan  Transportation Screening Complete Complete  HRI or Home Care Consult  Complete  Social Work Consult for Recovery Care Planning/Counseling  Complete  Palliative Care Screening  Not Applicable  Medication Review Oceanographer) Complete Complete  PCP or Specialist appointment within 3-5 days of discharge Complete   HRI or Home Care Consult Complete   SW Recovery Care/Counseling Consult Complete   Palliative Care Screening Not Applicable   Skilled Nursing Facility Complete

## 2023-04-06 NOTE — Discharge Summary (Signed)
Physician Discharge Summary  Christine [name] KGM:[id number] DOB: July 10, 1950  PCP: Pcp, No  Admitted from: SNF Discharged to: SNF  Admit date: 03/21/2023 Discharge date: 04/06/2023  Recommendations for Outpatient Follow-up:    Follow-up Information     Stechschulte, Hyman Hopes, MD. Go on 04/29/2023.   Specialty: Surgery Why: 2:30 PM, please arrive 30 min prior to appointment time to check in. Contact information: [address] [place] [address] [phone]         MD at SNF Follow up.   Why: To be seen in 2 to 3 days with repeat labs (CBC & CMP).                 Home Health: None    Equipment/Devices: TBD at SNF    Discharge Condition: Improved and stable.   Code Status: Limited: Do not attempt resuscitation (DNR) -DNR-LIMITED -Do Not Intubate/DNI  Diet recommendation:  Discharge Diet Orders (From admission, onward)     Start     Ordered   04/06/23 0000  Diet general        04/06/23 1303             Discharge Diagnoses:  Principal Problem:   Bowel perforation (HCC) Active Problems:   Normocytic anemia   GAD (generalized anxiety disorder)   Anxiety   Hyperlipidemia   Hypernatremia   AKI (acute kidney injury) (HCC)   Hypoglycemia   Hypotension   Metabolic acidosis   Brief Summary: Patient is a 72 year old female history of hypertension, hyperlipidemia, OSA, GERD, morbid obesity, 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  Patient found to have intra-abdominal free air and fluid and concern for possible GJ anastomosis ulceration resulting in perforation.  Patient also noted to have significant abdominal pain and vomiting for 3 days prior to admission.  Patient was seen by general surgery and underwent  exploratory laparotomy, abdominal washout, Graham patch repair of perforated gastrojejunal ulcer, intraperitoneal  drain placement.  Patient subsequently admitted to the stepdown unit for closer monitoring since hypotensive and also in acute renal failure.  Patient was then transferred out of the stepdown unit.  Eventually transferred to floor.  Main issue prolonging hospitalization had been hypoglycemia which required initiation and titration of tube feeds along with oral intake.  No further hypoglycemia over more than 24 hours.  Surgery has seen and cleared for discharge.     Assessment and Plan: Perforated marginal ulcer Likely secondary to G-tube displacement versus GJ anastomosis ulceration.   Status-post ex lap, abdominal washout, Graham patch repair of perforated gastrojejunal ulcer, intraperitoneal drain placement on 03/21/2023.   Continue oral diet and TF.  Tolerating both well.  Also has not used any as needed pain or nausea meds over the last several days. Barrier to discharge had been hypoglycemia which has resolved more than 24 hours after initiation and titration of tube feeds. General surgery has seen and cleared for discharge and have arranged outpatient follow-up. Continue twice daily PPI and sucralfate.  Duration and dosing of these meds to be determined during outpatient follow-up with general surgery.   Hypoglycemia  Failure to thrive with poor oral intake Appreciate surgical intervention, started tube feeds 12/5. Currently on tube feeds at 45 mL/h and no further hypoglycemic events in more than 24 hours.  Patient also tolerating oral diet well. Monitor CBGs closely at SNF.   GERD Continue PPI, now  changed to twice daily.  Also on sucralfate started during this admission. Dose and duration of PPI and sucralfate to be determined during outpatient follow-up with general surgery.   Normocytic anemia Ferritin of 229, serum iron low at 11 TIBC low, folic acid normal at 19, vitamin B12 elevated at 1005. Hgb baseline 8-9 from May of this year Hgb stable, follow-up as an outpatient. Resume  prior iron supplements.   Bilateral medial aspect of leg tenderness   Has a chronic venous stasis.  Pitting edema noted with some tenderness, chronic per patient Continue oral Lasix-this is to be assessed closely at SNF to determine when and if Lasix can be stopped or changed to as needed.   Debility, deconditioning   PT OT recommended skilled nursing facility on discharge. Needs to be up to chair with meals, and mobilized multiple times per day.   Morbid obesity Body mass index is 44.32 kg/m   Hypokalemia, resolved. Monitor for refeeding syndrome.  No evidence.   Metabolic encephalopathy -- resolved   Likely multifactorial from surgery, IV narcotics and renal failure and hypoglycemia.     Hypotension -- resolved   AKI -- resolved Mild NAGMA Baseline creatinine around 0.7. Peaked at 2.11 this admission. Back to baseline.   Probable UTI -- resolved Urine culture with 60,000 colonies of E. coli resistant to multiple antibiotics except Zosyn.  Completed course of Zosyn during hospitalization.   Abnormal EKG with ST depression On admission.  No previous EKG. Admitting physician discussed with cardiology and recommended monitoring.  Patient did have tachycardia.  2D echocardiogram with LV function of 50 to 55% with no regional wall motion abnormality. May need outpatient follow-up with cardiology-defer to MD at SNF.   Hypernatremia -- resolved   Hypomagnesemia -- resolved     Consultations: General surgery  Procedures: As noted above   Discharge Instructions  Discharge Instructions     Call MD for:  difficulty breathing, headache or visual disturbances   Complete by: As directed    Call MD for:  extreme fatigue   Complete by: As directed    Call MD for:  persistant dizziness or light-headedness   Complete by: As directed    Call MD for:  persistant nausea and vomiting   Complete by: As directed    Call MD for:  redness, tenderness, or signs of infection (pain,  swelling, redness, odor or green/yellow discharge around incision site)   Complete by: As directed    Call MD for:  severe uncontrolled pain   Complete by: As directed    Call MD for:  temperature >100.4   Complete by: As directed    Diet - low sodium heart healthy   Complete by: As directed    Diet general   Complete by: As directed    Discharge instructions   Complete by: As directed    Monitor CBGs every 4 hours for the next couple of days and if no further hypoglycemia then can change to 3 times daily before meals and at bedtime.   Discharge wound care:   Complete by: As directed    Wound care  Every shift      Comments: Pack midline abdominal wound with saline-moistened kerlex. Cover with ABD pad and secure with tape. Change every shift.   Discharge wound care:   Complete by: As directed    As documented   Increase activity slowly   Complete by: As directed         Medication List  STOP taking these medications    antiseptic oral rinse Liqd   cephALEXin 500 MG capsule Commonly known as: KEFLEX   diclofenac Sodium 1 % Gel Commonly known as: VOLTAREN   feeding supplement (PROSource TF20) liquid   mirtazapine 15 MG disintegrating tablet Commonly known as: REMERON SOL-TAB   oxybutynin 15 MG 24 hr tablet Commonly known as: DITROPAN XL   oxyCODONE 5 MG immediate release tablet Commonly known as: Oxy IR/ROXICODONE   senna 8.6 MG Tabs tablet Commonly known as: SENOKOT   traMADol 50 MG tablet Commonly known as: ULTRAM       TAKE these medications    acetaminophen 325 MG tablet Commonly known as: TYLENOL Take 650 mg by mouth every 4 (four) hours as needed for mild pain or moderate pain.   ascorbic acid 500 MG tablet Commonly known as: VITAMIN C Take 500 mg by mouth daily.   bacitracin 500 UNIT/GM ointment Apply 1 Application topically 2 (two) times daily for 7 days. Apply around G tube site.   colchicine 0.6 MG tablet Take 1.2 mg by mouth  daily.   copper tablet Take 1 tablet (2 mg total) by mouth daily. Start taking on: April 07, 2023   DULoxetine 60 MG capsule Commonly known as: CYMBALTA Take 1 capsule (60 mg total) by mouth daily.   feeding supplement (GLUCERNA SHAKE) Liqd Take 237 mLs by mouth daily. What changed:  how much to take when to take this Another medication with the same name was removed. Continue taking this medication, and follow the directions you see here.   feeding supplement (JEVITY 1.2 CAL) Liqd Place 720 mLs into feeding tube continuous. 720 mL, Per Tube, at 45 mL/hr, Continuous, What changed:  how much to take when to take this additional instructions Another medication with the same name was removed. Continue taking this medication, and follow the directions you see here.   ferrous sulfate 325 (65 FE) MG EC tablet Take 325 mg by mouth daily with breakfast.   folic acid 1 MG tablet Commonly known as: FOLVITE Take 1 tablet (1 mg total) by mouth daily.   furosemide 20 MG tablet Commonly known as: LASIX Take 1 tablet (20 mg total) by mouth daily. Start taking on: April 07, 2023   gabapentin 100 MG capsule Commonly known as: NEURONTIN Take 1 capsule (100 mg total) by mouth 3 (three) times daily.   multivitamin with minerals Tabs tablet Take 1 tablet by mouth daily.   omeprazole 20 MG capsule Commonly known as: PRILOSEC Take 1 capsule (20 mg total) by mouth 2 (two) times daily before a meal. What changed: when to take this   promethazine 25 MG tablet Commonly known as: PHENERGAN Take 1 tablet (25 mg total) by mouth every 6 (six) hours as needed for nausea or vomiting.   sucralfate 1 GM/10ML suspension Commonly known as: CARAFATE Take 10 mLs (1 g total) by mouth 4 (four) times daily -  with meals and at bedtime.   thiamine 100 MG tablet Commonly known as: Vitamin B-1 Take 1 tablet (100 mg total) by mouth daily. What changed: how to take this   UNABLE TO FIND Med  Name: feeding supplement (BOOST / RESOURCE BREEZE) liquid 1 Container  1 Container, Oral, 2 times daily between meals   vitamin D3 25 MCG tablet Commonly known as: CHOLECALCIFEROL Take 5 tablets (5,000 Units total) by mouth daily. What changed: how to take this   zinc gluconate 50 MG tablet Take 50 mg by mouth  daily.       Allergies  Allergen Reactions   Prednisone Swelling    Legs swell    Tape Hives   Sulfa Antibiotics Rash      Procedures/Studies: DG UGI W SINGLE CM (SOL OR THIN BA)  Result Date: 03/25/2023 CLINICAL DATA:  [id number] Perforated ulcer (HCC) 045409. Postoperative. Rule out leak. EXAM: WATER SOLUBLE UPPER GI SERIES TECHNIQUE: Single-column upper GI series was performed using water soluble contrast. Radiation Exposure Index (as provided by the fluoroscopic device): 289.6 mGy Kerma CONTRAST:  100 mL of Omnipaque 300. COMPARISON:  None Available. FLUOROSCOPY: Fluoroscopy Time:  3 minutes FINDINGS: Scout: No free air under the domes of diaphragm. Left lung base and left lateral costophrenic angle appear clear. Patient is status post repair of perforated marginal ulcer. No evidence of leak. There is normal passage of barium from the esophagus into the proximal stomach and from the stomach into the jejunum via gastrojejunostomy anastomosis. No abnormal holdup. No large ulcer seen. The excluded stomach was not opacified, as expected. There is also smooth passage of contrast into the distal jejunum and via jejunojejunostomy. No abnormal holdup. No large ulcer seen. IMPRESSION: *No evidence of leak.  Please see above for details. Electronically Signed   By: Jules Schick M.D.   On: 03/25/2023 11:00   ECHOCARDIOGRAM COMPLETE  Result Date: 03/23/2023    ECHOCARDIOGRAM REPORT   Patient Name:   JASMONIQUE JUNIOR Date of Exam: 03/23/2023 Medical Rec #:  [id number]      Height:       61.0 in Accession #:    [id number]     Weight:       227.1 lb Date of Birth:  18-Oct-1950     BSA:           1.993 m Patient Age:    72 years       BP:           142/81 mmHg Patient Gender: F              HR:           120 bpm. Exam Location:  Inpatient Procedure: 2D Echo, Cardiac Doppler, Color Doppler and Intracardiac            Opacification Agent Indications:    Abnormal ECG  History:        Patient has prior history of Echocardiogram examinations, most                 recent 12/03/2019. Risk Factors:Dyslipidemia.  Sonographer:    Karma Ganja Referring Phys: [id number] DANIEL V THOMPSON  Sonographer Comments: No subcostal window and suboptimal apical window. Image acquisition challenging due to patient body habitus and Image acquisition challenging due to uncooperative patient. IMPRESSIONS  1. Left ventricular ejection fraction, by estimation, is 50 to 55%. The left ventricle has low normal function. The left ventricle has no regional wall motion abnormalities. Indeterminate diastolic filling due to E-A fusion.  2. Right ventricular systolic function is normal. The right ventricular size is normal. There is mildly elevated pulmonary artery systolic pressure. The estimated right ventricular systolic pressure is 40.2 mmHg.  3. Left atrial size was mildly dilated.  4. The mitral valve is normal in structure. No evidence of mitral valve regurgitation. No evidence of mitral stenosis.  5. The aortic valve is tricuspid. There is mild calcification of the aortic valve. Aortic valve regurgitation is mild. Aortic valve sclerosis is present, with no evidence of aortic valve stenosis. FINDINGS  Left Ventricle: Left ventricular ejection fraction, by estimation, is 50 to 55%. The left ventricle has low normal function. The left ventricle has no regional wall motion abnormalities. Definity contrast agent was given IV to delineate the left ventricular endocardial borders. The left ventricular internal cavity size was normal in size. There is no left ventricular hypertrophy. Abnormal (paradoxical) septal motion, consistent with left bundle  branch block. Indeterminate diastolic filling due to E-A fusion. Right Ventricle: The right ventricular size is normal. Right vetricular wall thickness was not well visualized. Right ventricular systolic function is normal. There is mildly elevated pulmonary artery systolic pressure. The tricuspid regurgitant velocity  is 3.05 m/s, and with an assumed right atrial pressure of 3 mmHg, the estimated right ventricular systolic pressure is 40.2 mmHg. Left Atrium: Left atrial size was mildly dilated. Right Atrium: Right atrial size was not well visualized. Pericardium: There is no evidence of pericardial effusion. Mitral Valve: The mitral valve is normal in structure. No evidence of mitral valve regurgitation. No evidence of mitral valve stenosis. Tricuspid Valve: The tricuspid valve is normal in structure. Tricuspid valve regurgitation is trivial. Aortic Valve: The aortic valve is tricuspid. There is mild calcification of the aortic valve. Aortic valve regurgitation is mild. Aortic valve sclerosis is present, with no evidence of aortic valve stenosis. Aortic valve mean gradient measures 7.0 mmHg. Aortic valve peak gradient measures 13.7 mmHg. Aortic valve area, by VTI measures 1.90 cm. Pulmonic Valve: The pulmonic valve was not assessed. Pulmonic valve regurgitation is not visualized. No evidence of pulmonic stenosis. Aorta: The aortic root and ascending aorta are structurally normal, with no evidence of dilitation. IAS/Shunts: The interatrial septum was not well visualized.  LEFT VENTRICLE PLAX 2D LVIDd:         3.45 cm     Diastology LVIDs:         2.60 cm     LV e' medial:    10.40 cm/s LV PW:         1.10 cm     LV E/e' medial:  9.6 LV IVS:        1.05 cm     LV e' lateral:   5.87 cm/s LVOT diam:     1.90 cm     LV E/e' lateral: 17.0 LV SV:         48 LV SV Index:   24 LVOT Area:     2.84 cm  LV Volumes (MOD) LV vol d, MOD A2C: 39.2 ml LV vol d, MOD A4C: 60.7 ml LV vol s, MOD A2C: 18.9 ml LV vol s, MOD A4C: 29.2  ml LV SV MOD A2C:     20.3 ml LV SV MOD A4C:     60.7 ml LV SV MOD BP:      26.7 ml RIGHT VENTRICLE RV S prime:     15.60 cm/s TAPSE (M-mode): 3.4 cm LEFT ATRIUM         Index LA diam:    4.00 cm 2.01 cm/m  AORTIC VALVE AV Area (Vmax):    1.93 cm AV Area (Vmean):   1.86 cm AV Area (VTI):     1.90 cm AV Vmax:           185.00 cm/s AV Vmean:          124.000 cm/s AV VTI:            0.252 m AV Peak Grad:      13.7 mmHg AV Mean Grad:  7.0 mmHg LVOT Vmax:         126.00 cm/s LVOT Vmean:        81.400 cm/s LVOT VTI:          0.169 m LVOT/AV VTI ratio: 0.67  AORTA Ao Root diam: 2.50 cm MITRAL VALVE                TRICUSPID VALVE MV Area (PHT): 7.16 cm     TR Peak grad:   37.2 mmHg MV Decel Time: 106 msec     TR Vmax:        305.00 cm/s MV E velocity: 100.00 cm/s MV A velocity: 34.30 cm/s   SHUNTS MV E/A ratio:  2.92         Systemic VTI:  0.17 m                             Systemic Diam: 1.90 cm Rachelle Hora Croitoru MD Electronically signed by Thurmon Fair MD Signature Date/Time: 03/23/2023/4:15:40 PM    Final    DG Fluoro Rm 1-60 Min  Result Date: 03/22/2023 INDICATION: Evaluate gastrostomy tube. Gastrostomy tube appeared to be malpositioned on CT. EXAM: FLUOROSCOPY TIME FOR G-TUBE INJECTION MEDICATIONS: None ANESTHESIA/SEDATION: None CONTRAST:  10 mL-administered into the gastric lumen. FLUOROSCOPY: Fluoroscopy time was included in the upper GI examination. COMPLICATIONS: None immediate. PROCEDURE: This procedure was performed immediately following the upper GI procedure while the patient was still in the fluoroscopy room. The gastrostomy tube was injected with contrast. Gastrostomy tube was flushed with water after the injection. FINDINGS: Gastrostomy tube injection demonstrates filling of the stomach. There was no evidence for contrast extravasation or leak. Gastrostomy tube flushed easily. IMPRESSION: Gastrostomy tube communicates with the stomach. No evidence for a leak. Electronically Signed   By: Richarda Overlie M.D.   On: 03/22/2023 11:30   IR GASTROSTOMY TUBE REMOVAL/REPAIR  Result Date: 03/21/2023 INDICATION: 72 year old with pneumoperitoneum and leak near the gastrojejunostomy anastomosis. In addition, the patient has pain at the gastrostomy tube site and CT demonstrates that the gastrostomy tube balloon is partially within the ventral abdominal wall. EXAM: 1. Gastrostomy tube injection and manipulation with fluoroscopy MEDICATIONS: Viscous lidocaine ANESTHESIA/SEDATION: None CONTRAST:  25 mL Omnipaque 300-administered into the gastric lumen. FLUOROSCOPY: Radiation Exposure Index (as provided by the fluoroscopic device): 56 mGy Kerma COMPLICATIONS: None immediate. PROCEDURE: Informed consent was obtained for gastrostomy tube injection and exchange. The gastrostomy tube was initially injected with contrast immediately following the upper GI while the patient was still on the fluoroscopy table. The gastrostomy tube injection confirmed filling of the stomach without a leak. However, the gastrostomy tube would not move freely within the stomach suggesting that the balloon was stuck within the ventral abdominal wall. Therefore, the patient was transferred to Interventional Radiology for exchange or manipulation of the gastrostomy tube. Patient was brought into the interventional radiology suite. The anterior abdomen was prepped and draped in sterile fashion. Maximal barrier sterile technique was utilized including caps, mask, sterile gowns, sterile gloves, sterile drape, hand hygiene and skin antiseptic. Viscous lidocaine was placed along the gastrostomy tube tract. Following the administration of the viscous lidocaine, the gastrostomy tube balloon advanced into the stomach lumen and contrast injection confirmed placement within the gastric lumen. The balloon was deflated. The balloon was re-inflated with 4 mL of saline. Additional contrast was injected to confirm placement in the stomach. No evidence for a leak.  FINDINGS: The gastrostomy tube balloon appeared  to be stuck within the ventral abdominal wall but the gastrostomy tube was still communicating with the gastric lumen and there was no evidence for a leak. The balloon was advanced back into the stomach lumen after viscous lidocaine was injected around the tube. At the end of the procedure, the gastrostomy tube was well positioned within the stomach. Currently, there is 4 mL of saline within the gastrostomy tube retention balloon. IMPRESSION: Successful repositioning of the gastrostomy tube balloon. Contrast injection confirmed placement in the stomach. No evidence for a leak at the gastrostomy tube site. Electronically Signed   By: Richarda Overlie M.D.   On: 03/21/2023 16:18   DG UGI W SINGLE CM (SOL OR THIN BA)  Result Date: 03/21/2023 CLINICAL DATA:  Gastric bypass with suspected leak on CT abdomen. EXAM: WATER SOLUBLE UPPER GI SERIES TECHNIQUE: Single-column upper GI series was performed using water soluble contrast. Radiation Exposure Index (as provided by the fluoroscopic device): 69.8 mGy Kerma CONTRAST:  50 cc Omnipaque 300. COMPARISON:  CT abdomen pelvis 03/21/2023. FLUOROSCOPY: Fluoroscopy Time: Radiation Exposure Index (if provided by the fluoroscopic device): 69.8 Number of Acquired Spot Images: 8 FINDINGS: Scout view of the abdomen shows postoperative changes in the left upper quadrant related to gastric bypass. Surgical clips in the right upper quadrant. Mild gaseous distension of colon. Residual excreted contrast in the intrarenal collecting systems and bladder from CT earlier today. Patient was imaged in the 45 degree recumbent AP, LPO and RPO positions. Initially, there is filling of the gastric pouch with reflux of contrast into the esophagus. Subsequent imaging shows leakage of contrast from the distal gastric pouch, in the region of the anastomosis. IMPRESSION: Gastric bypass with a gastrojejunostomy anastomotic leak. Electronically Signed   By:  Leanna Battles M.D.   On: 03/21/2023 15:24   CT ABDOMEN PELVIS W CONTRAST  Addendum Date: 03/21/2023   ADDENDUM REPORT: 03/21/2023 12:52 ADDENDUM: Small pockets of gas situated between the bowel and liver. Evidence for an ulceration near the GJ anastomosis on image 52/8 and image 50/8. Findings are suggestive for an anastomotic ulceration and likely the source for the free air. These results were communicated on 03/21/2023 at 12:49 pm to provider Hosie Spangle, who acknowledged these results. Electronically Signed   By: Richarda Overlie M.D.   On: 03/21/2023 12:52   Result Date: 03/21/2023 CLINICAL DATA:  72 year old female with abdominal pain. Pain at gastrostomy tube placed about 5 weeks ago. EXAM: CT ABDOMEN AND PELVIS WITH CONTRAST TECHNIQUE: Multidetector CT imaging of the abdomen and pelvis was performed using the standard protocol following bolus administration of intravenous contrast. RADIATION DOSE REDUCTION: This exam was performed according to the departmental dose-optimization program which includes automated exposure control, adjustment of the mA and/or kV according to patient size and/or use of iterative reconstruction technique. CONTRAST:  OMNIPAQUE IOHEXOL 300 MG/ML  SOLN COMPARISON:  CT Abdomen and Pelvis 01/08/2023. FINDINGS: Lower chest: Heart size remains normal. No pericardial or pleural effusion. Increased patchy lung base opacity most resembling atelectasis. Hepatobiliary: Pneumoperitoneum. Small volume of perihepatic free fluid with simple fluid density. Hepatic steatosis. Absent gallbladder. Pancreas: Mild inflammation in the lesser sac appears related to the stomach rather than the pancreas. Negative pancreas otherwise. Spleen: Trace perisplenic free fluid. Adrenals/Urinary Tract: Normal adrenal glands. Nonobstructed kidneys. Chronic renal cysts (no follow-up imaging recommended). Absent renal contrast excretion on the delayed images. But the ureters are decompressed. Bladder is  decompressed. Chronic pelvic phleboliths. Stomach/Bowel: Chronic Roux-en-Y type gastrojejunostomy with a superimposed percutaneous  gastrostomy tube into the distal portion of the stomach. Abnormal pneumoperitoneum, moderate volume in the upper abdomen and inflammation surrounding the stomach and gastrojejunostomy (series 4, images 22 and 24). Furthermore, the balloon of the enteric tube is at the level of the abdominal wall, peritoneal lining on series 4, image 33. Mild regional inflammation. No fluid collection. The distal stomach and proximal duodenum also appear inflamed. Distal duodenum and ligament of Treitz have a more normal appearance. Downstream small bowel anastomosis on series 4, image 36 appears negative. Superimposed large bowel is redundant but mostly decompressed. Mild secondary inflammation of the splenic flexure with regional pneumoperitoneum there. Negative right colon. Cecum on a lax mesentery series 4, image 51. Evidence of prior appendectomy. Vascular/Lymphatic: Calcified aortic atherosclerosis. Major arterial structures in the abdomen and pelvis are patent. Normal caliber abdominal aorta. Portal venous system appears to be patent. No lymphadenopathy identified. Reproductive: Chronically absent uterus. Ovaries are within normal limits. Other: Small volume of simple density free fluid at the pelvic inlet on the right series 4, image 65. And small volume of what appears to be fluid tracking into and otherwise fat containing right inguinal hernia (series 4, image 78), new since September. Musculoskeletal: Advanced lumbar facet arthropathy. Stable visualized osseous structures. IMPRESSION: 1. Malpositioned percutaneous Gastrostomy Tube, with balloon pulled out through the ventral gastric wall and associated pneumoperitoneum and free fluid in the abdomen. The stomach, a chronic gastrojejunostomy, and the proximal duodenum all appear inflamed. But no drainable fluid collection, abscess. 2. Underlying  chronic Roux-en-Y type gastric bypass. Small bowel distal to the gastrojejunostomy and the ligament of Treitz appears to remain normal. Mild secondary inflammation of the splenic flexure. 3. Small volume of free fluid in the pelvis, within a right inguinal hernia appears related to #1. 4. Hepatic steatosis. Lung base atelectasis. Aortic Atherosclerosis (ICD10-I70.0). Electronically Signed: By: Odessa Fleming M.D. On: 03/21/2023 09:48      Subjective: Denies complaints.  No pain reported.  Tolerating diet well.  As per patient and nursing, ordered out via door Dash last night and tolerated thigh food.  Having BMs.  Tolerating tube feeds well.  No hypoglycemic events in the last more than 24 hours.  Patient reports that she has been ambulating with assistance.  Discharge Exam:  Vitals:   04/05/23 1916 04/06/23 0500 04/06/23 0518 04/06/23 1236  BP: (!) 155/68  (!) 140/48 (!) 116/59  Pulse: (!) 109  (!) 108 (!) 105  Resp: 18  20 20   Temp: 97.8 F (36.6 C)  98.4 F (36.9 C) 97.6 F (36.4 C)  TempSrc: Oral   Oral  SpO2: 100%  100% 93%  Weight:  105.9 kg    Height:        General: Elderly female, looks younger than stated age, moderately built and obese lying comfortably propped up in bed without distress. Cardiovascular: S1 & S2 heard, RRR, S1/S2 +. No murmurs, rubs, gallops or clicks. No JVD or pedal edema. Respiratory: Clear to auscultation without wheezing, rhonchi or crackles. No increased work of breathing. Abdominal:  Non distended, non tender & soft. No organomegaly or masses appreciated. Normal bowel sounds heard.  Dressing around surgical site/G-tube clean, dry and intact without evidence of leakage. CNS: Alert and oriented. No focal deficits. Extremities: Chronic bilateral leg scars, patient reports that this was due to accident in the past, also with 2+ pitting edema of legs.    The results of significant diagnostics from this hospitalization (including imaging, microbiology, ancillary  and laboratory) are listed  below for reference.     Microbiology: No results found for this or any previous visit (from the past 240 hour(s)).   Labs: CBC: No results for input(s): "WBC", "NEUTROABS", "HGB", "HCT", "MCV", "PLT" in the last 168 hours.  Basic Metabolic Panel: Recent Labs  Lab 04/01/23 0440 04/01/23 1646 04/02/23 0417 04/02/23 1012 04/03/23 0423 04/04/23 0427 04/04/23 1207 04/05/23 0402  NA 134*  --   --  136 136 133* 135 135  K 4.0  --   --  4.2 4.5 3.9 4.1 4.0  CL 109  --   --  112* 109 105 107 108  CO2 18*  --   --  20* 20* 21* 21* 20*  GLUCOSE 156*  --   --  88 89 89 98 84  BUN 6*  --   --  6* 10 12 13 13   CREATININE 0.59  --   --  0.65 0.66 0.61 0.63 0.48  CALCIUM 7.8*  --   --  7.6* 8.0* 7.9* 8.1* 7.8*  MG 1.8 1.7 1.9  --  1.8  --   --  1.8  PHOS 2.8 2.6 2.9  --  3.1  --   --  3.5    Liver Function Tests: No results for input(s): "AST", "ALT", "ALKPHOS", "BILITOT", "PROT", "ALBUMIN" in the last 168 hours.  CBG: Recent Labs  Lab 04/05/23 1918 04/06/23 0016 04/06/23 0516 04/06/23 0739 04/06/23 1124  GLUCAP 103* 83 83 100* 92     Urinalysis    Component Value Date/Time   COLORURINE YELLOW 03/22/2023 1026   APPEARANCEUR CLOUDY (A) 03/22/2023 1026   LABSPEC >1.046 (H) 03/22/2023 1026   PHURINE 5.0 03/22/2023 1026   GLUCOSEU NEGATIVE 03/22/2023 1026   HGBUR SMALL (A) 03/22/2023 1026   BILIRUBINUR NEGATIVE 03/22/2023 1026   KETONESUR NEGATIVE 03/22/2023 1026   PROTEINUR 30 (A) 03/22/2023 1026   NITRITE NEGATIVE 03/22/2023 1026   LEUKOCYTESUR LARGE (A) 03/22/2023 1026      Time coordinating discharge: 35 minutes  SIGNED:  Marcellus Scott, MD,  FACP, [facility], [facility], [facility]   [facility] [place]     To contact the attending provider between 7A-7P or the covering provider during after hours 7P-7A, please log into the web site [email or web address] and access using universal [place] password for that web  site. If you do not have the password, please call the hospital operator.

## 2023-04-07 DIAGNOSIS — K275 Chronic or unspecified peptic ulcer, site unspecified, with perforation: Secondary | ICD-10-CM | POA: Diagnosis not present

## 2023-04-07 DIAGNOSIS — R5381 Other malaise: Secondary | ICD-10-CM | POA: Diagnosis not present

## 2023-04-07 DIAGNOSIS — D638 Anemia in other chronic diseases classified elsewhere: Secondary | ICD-10-CM | POA: Diagnosis not present

## 2023-04-07 DIAGNOSIS — R627 Adult failure to thrive: Secondary | ICD-10-CM | POA: Diagnosis not present

## 2023-04-08 DIAGNOSIS — F411 Generalized anxiety disorder: Secondary | ICD-10-CM | POA: Diagnosis not present

## 2023-04-08 DIAGNOSIS — M150 Primary generalized (osteo)arthritis: Secondary | ICD-10-CM | POA: Diagnosis not present

## 2023-04-08 DIAGNOSIS — K219 Gastro-esophageal reflux disease without esophagitis: Secondary | ICD-10-CM | POA: Diagnosis not present

## 2023-04-08 DIAGNOSIS — E43 Unspecified severe protein-calorie malnutrition: Secondary | ICD-10-CM | POA: Diagnosis not present

## 2023-04-08 DIAGNOSIS — G629 Polyneuropathy, unspecified: Secondary | ICD-10-CM | POA: Diagnosis not present

## 2023-04-08 DIAGNOSIS — M109 Gout, unspecified: Secondary | ICD-10-CM | POA: Diagnosis not present

## 2023-04-08 DIAGNOSIS — E669 Obesity, unspecified: Secondary | ICD-10-CM | POA: Diagnosis not present

## 2023-04-08 DIAGNOSIS — R627 Adult failure to thrive: Secondary | ICD-10-CM | POA: Diagnosis not present

## 2023-04-08 DIAGNOSIS — I10 Essential (primary) hypertension: Secondary | ICD-10-CM | POA: Diagnosis not present

## 2023-04-08 DIAGNOSIS — N3281 Overactive bladder: Secondary | ICD-10-CM | POA: Diagnosis not present

## 2023-04-08 DIAGNOSIS — N3000 Acute cystitis without hematuria: Secondary | ICD-10-CM | POA: Diagnosis not present

## 2023-04-08 DIAGNOSIS — D638 Anemia in other chronic diseases classified elsewhere: Secondary | ICD-10-CM | POA: Diagnosis not present

## 2023-04-10 ENCOUNTER — Emergency Department (HOSPITAL_COMMUNITY): Payer: Medicare HMO

## 2023-04-10 ENCOUNTER — Other Ambulatory Visit: Payer: Self-pay

## 2023-04-10 ENCOUNTER — Encounter (HOSPITAL_COMMUNITY): Payer: Self-pay

## 2023-04-10 ENCOUNTER — Inpatient Hospital Stay (HOSPITAL_COMMUNITY)
Admission: EM | Admit: 2023-04-10 | Discharge: 2023-04-22 | DRG: 394 | Disposition: A | Discharge status: Home / Self Care | Payer: Medicare HMO | Source: Skilled Nursing Facility | Attending: Student | Admitting: Student

## 2023-04-10 DIAGNOSIS — I82409 Acute embolism and thrombosis of unspecified deep veins of unspecified lower extremity: Secondary | ICD-10-CM | POA: Insufficient documentation

## 2023-04-10 DIAGNOSIS — R627 Adult failure to thrive: Secondary | ICD-10-CM | POA: Diagnosis not present

## 2023-04-10 DIAGNOSIS — Z91048 Other nonmedicinal substance allergy status: Secondary | ICD-10-CM

## 2023-04-10 DIAGNOSIS — E8809 Other disorders of plasma-protein metabolism, not elsewhere classified: Secondary | ICD-10-CM | POA: Diagnosis present

## 2023-04-10 DIAGNOSIS — K2970 Gastritis, unspecified, without bleeding: Secondary | ICD-10-CM | POA: Diagnosis not present

## 2023-04-10 DIAGNOSIS — Z79899 Other long term (current) drug therapy: Secondary | ICD-10-CM

## 2023-04-10 DIAGNOSIS — Z9884 Bariatric surgery status: Secondary | ICD-10-CM

## 2023-04-10 DIAGNOSIS — R197 Diarrhea, unspecified: Secondary | ICD-10-CM | POA: Diagnosis not present

## 2023-04-10 DIAGNOSIS — I824Y9 Acute embolism and thrombosis of unspecified deep veins of unspecified proximal lower extremity: Secondary | ICD-10-CM

## 2023-04-10 DIAGNOSIS — E162 Hypoglycemia, unspecified: Secondary | ICD-10-CM | POA: Diagnosis not present

## 2023-04-10 DIAGNOSIS — Z993 Dependence on wheelchair: Secondary | ICD-10-CM

## 2023-04-10 DIAGNOSIS — K760 Fatty (change of) liver, not elsewhere classified: Secondary | ICD-10-CM | POA: Diagnosis not present

## 2023-04-10 DIAGNOSIS — Z751 Person awaiting admission to adequate facility elsewhere: Secondary | ICD-10-CM

## 2023-04-10 DIAGNOSIS — M109 Gout, unspecified: Secondary | ICD-10-CM | POA: Diagnosis present

## 2023-04-10 DIAGNOSIS — T85528A Displacement of other gastrointestinal prosthetic devices, implants and grafts, initial encounter: Secondary | ICD-10-CM | POA: Diagnosis not present

## 2023-04-10 DIAGNOSIS — Z882 Allergy status to sulfonamides status: Secondary | ICD-10-CM

## 2023-04-10 DIAGNOSIS — Z8249 Family history of ischemic heart disease and other diseases of the circulatory system: Secondary | ICD-10-CM

## 2023-04-10 DIAGNOSIS — K219 Gastro-esophageal reflux disease without esophagitis: Secondary | ICD-10-CM | POA: Diagnosis present

## 2023-04-10 DIAGNOSIS — K9423 Gastrostomy malfunction: Secondary | ICD-10-CM | POA: Diagnosis not present

## 2023-04-10 DIAGNOSIS — Z87440 Personal history of urinary (tract) infections: Secondary | ICD-10-CM

## 2023-04-10 DIAGNOSIS — Z860100 Personal history of colon polyps, unspecified: Secondary | ICD-10-CM

## 2023-04-10 DIAGNOSIS — G4733 Obstructive sleep apnea (adult) (pediatric): Secondary | ICD-10-CM | POA: Diagnosis present

## 2023-04-10 DIAGNOSIS — K224 Dyskinesia of esophagus: Secondary | ICD-10-CM | POA: Diagnosis present

## 2023-04-10 DIAGNOSIS — E66813 Obesity, class 3: Secondary | ICD-10-CM | POA: Diagnosis present

## 2023-04-10 DIAGNOSIS — I10 Essential (primary) hypertension: Secondary | ICD-10-CM | POA: Diagnosis present

## 2023-04-10 DIAGNOSIS — Z6841 Body Mass Index (BMI) 40.0 and over, adult: Secondary | ICD-10-CM

## 2023-04-10 DIAGNOSIS — I82412 Acute embolism and thrombosis of left femoral vein: Secondary | ICD-10-CM | POA: Diagnosis present

## 2023-04-10 DIAGNOSIS — K275 Chronic or unspecified peptic ulcer, site unspecified, with perforation: Secondary | ICD-10-CM | POA: Diagnosis not present

## 2023-04-10 DIAGNOSIS — E872 Acidosis, unspecified: Secondary | ICD-10-CM | POA: Diagnosis present

## 2023-04-10 DIAGNOSIS — Z87891 Personal history of nicotine dependence: Secondary | ICD-10-CM

## 2023-04-10 DIAGNOSIS — R2689 Other abnormalities of gait and mobility: Secondary | ICD-10-CM | POA: Diagnosis present

## 2023-04-10 DIAGNOSIS — R5381 Other malaise: Secondary | ICD-10-CM | POA: Diagnosis present

## 2023-04-10 DIAGNOSIS — R109 Unspecified abdominal pain: Secondary | ICD-10-CM | POA: Diagnosis not present

## 2023-04-10 DIAGNOSIS — Z8711 Personal history of peptic ulcer disease: Secondary | ICD-10-CM

## 2023-04-10 DIAGNOSIS — K911 Postgastric surgery syndromes: Secondary | ICD-10-CM | POA: Diagnosis present

## 2023-04-10 DIAGNOSIS — I2510 Atherosclerotic heart disease of native coronary artery without angina pectoris: Secondary | ICD-10-CM | POA: Diagnosis present

## 2023-04-10 DIAGNOSIS — F411 Generalized anxiety disorder: Secondary | ICD-10-CM | POA: Diagnosis present

## 2023-04-10 DIAGNOSIS — Z888 Allergy status to other drugs, medicaments and biological substances status: Secondary | ICD-10-CM

## 2023-04-10 DIAGNOSIS — E785 Hyperlipidemia, unspecified: Secondary | ICD-10-CM | POA: Diagnosis present

## 2023-04-10 DIAGNOSIS — Z66 Do not resuscitate: Secondary | ICD-10-CM | POA: Diagnosis present

## 2023-04-10 DIAGNOSIS — R100 Acute abdomen: Secondary | ICD-10-CM | POA: Diagnosis not present

## 2023-04-10 DIAGNOSIS — E860 Dehydration: Secondary | ICD-10-CM | POA: Diagnosis present

## 2023-04-10 DIAGNOSIS — Z9049 Acquired absence of other specified parts of digestive tract: Secondary | ICD-10-CM

## 2023-04-10 DIAGNOSIS — D649 Anemia, unspecified: Secondary | ICD-10-CM | POA: Diagnosis present

## 2023-04-10 LAB — COMPREHENSIVE METABOLIC PANEL
ALT: 29 U/L (ref 0–44)
AST: 46 U/L — ABNORMAL HIGH (ref 15–41)
Albumin: 1.8 g/dL — ABNORMAL LOW (ref 3.5–5.0)
Alkaline Phosphatase: 181 U/L — ABNORMAL HIGH (ref 38–126)
Anion gap: 7 (ref 5–15)
BUN: 9 mg/dL (ref 8–23)
CO2: 23 mmol/L (ref 22–32)
Calcium: 8.3 mg/dL — ABNORMAL LOW (ref 8.9–10.3)
Chloride: 103 mmol/L (ref 98–111)
Creatinine, Ser: 0.53 mg/dL (ref 0.44–1.00)
GFR, Estimated: >60 mL/min (ref >60)
Glucose, Bld: 105 mg/dL — ABNORMAL HIGH (ref 70–99)
Potassium: 3.6 mmol/L (ref 3.5–5.1)
Sodium: 133 mmol/L — ABNORMAL LOW (ref 135–145)
Total Bilirubin: 0.3 mg/dL (ref <1.2)
Total Protein: 6.3 g/dL — ABNORMAL LOW (ref 6.5–8.1)

## 2023-04-10 LAB — CBC WITH DIFFERENTIAL/PLATELET
Abs Immature Granulocytes: 0.03 10*3/uL (ref 0.00–0.07)
Basophils Absolute: 0 10*3/uL (ref 0.0–0.1)
Basophils Relative: 1 %
Eosinophils Absolute: 0 10*3/uL (ref 0.0–0.5)
Eosinophils Relative: 1 %
HCT: 28.2 % — ABNORMAL LOW (ref 36.0–46.0)
Hemoglobin: 9.1 g/dL — ABNORMAL LOW (ref 12.0–15.0)
Immature Granulocytes: 1 %
Lymphocytes Relative: 35 %
Lymphs Abs: 1.4 10*3/uL (ref 0.7–4.0)
MCH: 29.9 pg (ref 26.0–34.0)
MCHC: 32.3 g/dL (ref 30.0–36.0)
MCV: 92.8 fL (ref 80.0–100.0)
Monocytes Absolute: 0.5 10*3/uL (ref 0.1–1.0)
Monocytes Relative: 12 %
Neutro Abs: 2.1 10*3/uL (ref 1.7–7.7)
Neutrophils Relative %: 50 %
Platelets: 613 10*3/uL — ABNORMAL HIGH (ref 150–400)
RBC: 3.04 MIL/uL — ABNORMAL LOW (ref 3.87–5.11)
RDW: 17.7 % — ABNORMAL HIGH (ref 11.5–15.5)
WBC: 4 10*3/uL (ref 4.0–10.5)
nRBC: 0 % (ref 0.0–0.2)

## 2023-04-10 LAB — LIPASE, BLOOD: Lipase: 28 U/L (ref 11–51)

## 2023-04-10 MED ORDER — HEPARIN (PORCINE) 25000 UT/250ML-% IV SOLN
1500.0000 [IU]/h | INTRAVENOUS | Status: DC
  Administered 2023-04-11: 1350 [IU]/h via INTRAVENOUS
  Administered 2023-04-11: 1200 [IU]/h via INTRAVENOUS
  Filled 2023-04-10 (×2): qty 250
  Filled 2023-04-10: qty 500

## 2023-04-10 MED ORDER — LACTATED RINGERS IV BOLUS
1000.0000 mL | Freq: Once | INTRAVENOUS | Status: AC
  Administered 2023-04-10: 1000 mL via INTRAVENOUS

## 2023-04-10 MED ORDER — HEPARIN BOLUS VIA INFUSION
4000.0000 [IU] | Freq: Once | INTRAVENOUS | Status: AC
  Administered 2023-04-11: 4000 [IU] via INTRAVENOUS
  Filled 2023-04-10: qty 4000

## 2023-04-10 MED ORDER — PANTOPRAZOLE SODIUM 40 MG IV SOLR
40.0000 mg | Freq: Once | INTRAVENOUS | Status: AC
Start: 2023-04-10 — End: 2023-04-10
  Administered 2023-04-10: 40 mg via INTRAVENOUS
  Filled 2023-04-10: qty 10

## 2023-04-10 MED ORDER — ONDANSETRON HCL 4 MG/2ML IJ SOLN
4.0000 mg | Freq: Once | INTRAMUSCULAR | Status: AC
Start: 2023-04-10 — End: 2023-04-10
  Administered 2023-04-10: 4 mg via INTRAVENOUS
  Filled 2023-04-10: qty 2

## 2023-04-10 MED ORDER — IOHEXOL 300 MG/ML  SOLN
100.0000 mL | Freq: Once | INTRAMUSCULAR | Status: AC | PRN
  Administered 2023-04-10: 100 mL via INTRAVENOUS

## 2023-04-10 MED ORDER — MORPHINE SULFATE (PF) 4 MG/ML IV SOLN
4.0000 mg | Freq: Once | INTRAVENOUS | Status: AC
  Administered 2023-04-10: 4 mg via INTRAVENOUS
  Filled 2023-04-10: qty 1

## 2023-04-10 NOTE — Progress Notes (Signed)
PHARMACY - ANTICOAGULATION CONSULT NOTE  Pharmacy Consult for heparin Indication: DVT  Allergies  Allergen Reactions   Prednisone Swelling    Legs swell    Tape Hives   Sulfa Antibiotics Rash    Patient Measurements:   Heparin Dosing Weight: 74kg  Vital Signs: Temp: 97.7 F (36.5 C) (12/15 1937) Temp Source: Oral (12/15 1937) BP: 157/73 (12/15 2130) Pulse Rate: 87 (12/15 2130)  Labs: Recent Labs    04/10/23 2018  HGB 9.1*  HCT 28.2*  PLT 613*  CREATININE 0.53    Estimated Creatinine Clearance: 73.3 mL/min (by C-G formula based on SCr of 0.53 mg/dL).   Medical History: Past Medical History:  Diagnosis Date   Allergy    dust, pollen, sulfa, prednisone   Anemia    Anxiety    Arthritis    "knees" (04/26/2016)   Colon polyps    benign per pt   Coronary artery disease    mild per 2015 cath in [place] (OM1 30%, RCA 30%)   GERD (gastroesophageal reflux disease)    Gout    History of hiatal hernia    Hyperlipidemia 05/20/2021   Hypertension    Migraine    "none since early /2017" (05/06/2016)   Obesity    OSA on CPAP    uses CPAP   Pre-diabetes    Pre-operative cardiovascular examination 08/22/2008   Renal insufficiency 11/09/2022   Vasculitis (HCC)    Bilateral     Assessment: 72yo female presents with abdominal pain and nausea.  Pharmacy to dose heparin for DVT.  No prior AC noted  HGb 9.1, plts 613, Scr 0.53  Goal of Therapy:  Heparin level 0.3-0.7 units/ml Monitor platelets by anticoagulation protocol: Yes   Plan:  Heparin bolus 4000 units x 1 Start heparin drip at 1200 units/hr Heparin level in 8 hours Daily CBC   Arley Phenix RPh 04/10/2023, 11:51 PM

## 2023-04-10 NOTE — ED Provider Notes (Signed)
[place] EMERGENCY DEPARTMENT AT [facility] Provider Note   CSN: [id number] Arrival date & time: 04/10/23  1931     History  No chief complaint on file.   Dorthe Neilsen is a 72 y.o. female.  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  She reports that her care at rehab has been very inconsistent.  She states that they are changing her bandage but yesterday she only received her PPI 1 time when she is supposed to have it multiple times a day as well as her Carafate.  She reports after that is when her stomach started hurting.  She has been having bowel movements regularly.  She is still getting tube feeds and what she takes by mouth varies depending on the day.  She left the hospital without needing any pain medication and has not been taking pain medication at the facility.  She has not had fever, chest pain or shortness of breath.  The history is provided by the patient.       Home Medications Prior to Admission medications   Medication Sig Start Date End Date Taking? Authorizing Provider  acetaminophen (TYLENOL) 325 MG tablet Take 650 mg by mouth every 4 (four) hours as needed for mild pain or moderate pain.    [provider]  ascorbic acid (VITAMIN C) 500 MG tablet Take 500 mg by mouth daily.    [provider]  bacitracin 500 UNIT/GM ointment Apply 1 Application topically 2 (two) times daily for 7 days. Apply around G tube site. 04/06/23 04/13/23  Elease Etienne, MD  colchicine 0.6 MG tablet Take 1.2 mg by mouth daily.    [provider]  copper tablet Take 1 tablet (2  mg total) by mouth daily. 04/07/23   Hongalgi, Maximino Greenland, MD  DULoxetine (CYMBALTA) 60 MG capsule Take 1 capsule (60 mg total) by mouth daily. 12/25/21   [name], DO  feeding supplement, GLUCERNA SHAKE, (GLUCERNA SHAKE) LIQD Take 237 mLs by mouth daily. 04/06/23   Hongalgi, Maximino Greenland, MD  ferrous sulfate 325 (65 FE) MG EC tablet Take 325 mg by mouth daily with breakfast.    [provider]  folic acid (FOLVITE) 1 MG tablet Take 1 tablet (1 mg total) by mouth daily. 11/16/22   Azucena Fallen, MD  furosemide (LASIX) 20 MG tablet Take 1 tablet (20 mg total) by mouth daily. 04/07/23   Hongalgi, Maximino Greenland, MD  gabapentin (NEURONTIN) 100 MG capsule Take 1 capsule (100 mg total) by mouth 3 (three) times daily. 11/15/22   Azucena Fallen, MD  Multiple Vitamin (MULTIVITAMIN WITH MINERALS) TABS tablet Take 1 tablet by mouth daily. 11/16/22   Azucena Fallen, MD  Nutritional Supplements (FEEDING SUPPLEMENT, JEVITY 1.2 CAL,) LIQD Place 720 mLs into feeding tube continuous. 720 mL, Per Tube, at 45 mL/hr, Continuous, 04/06/23   Hongalgi, Maximino Greenland, MD  omeprazole (PRILOSEC) 20 MG capsule Take 1 capsule (20 mg total) by mouth 2 (two) times daily before a meal. 04/06/23   Hongalgi, Maximino Greenland, MD  promethazine (PHENERGAN) 25 MG tablet Take 1 tablet (25 mg total) by mouth every 6 (six)  hours as needed for nausea or vomiting. 10/27/22   [name], DO  sucralfate (CARAFATE) 1 GM/10ML suspension Take 10 mLs (1 g total) by mouth 4 (four) times daily -  with meals and at bedtime. 04/06/23   Hongalgi, Maximino Greenland, MD  thiamine (VITAMIN B-1) 100 MG tablet Take 1 tablet (100 mg total) by mouth daily. 04/06/23   Hongalgi, Maximino Greenland, MD  UNABLE TO FIND Med Name: feeding supplement (BOOST / RESOURCE BREEZE) liquid 1 Container  1 Container, Oral, 2 times daily between meals 04/06/23   Hongalgi, Maximino Greenland, MD  vitamin D3 (CHOLECALCIFEROL) 25 MCG tablet Take 5 tablets (5,000 Units total) by mouth daily.  04/06/23   Hongalgi, Maximino Greenland, MD  zinc gluconate 50 MG tablet Take 50 mg by mouth daily.    [provider]      Allergies    Prednisone, Tape, and Sulfa antibiotics    Review of Systems   Review of Systems  Physical Exam Updated Vital Signs BP (!) 157/73   Pulse 87   Temp 97.7 F (36.5 C) (Oral)   Resp 17   SpO2 98%  Physical Exam Vitals and nursing note reviewed.  Constitutional:      General: She is not in acute distress.    Appearance: She is well-developed.  HENT:     Head: Normocephalic and atraumatic.  Eyes:     Conjunctiva/sclera: Conjunctivae normal.     Pupils: Pupils are equal, round, and reactive to light.  Cardiovascular:     Rate and Rhythm: Normal rate and regular rhythm.     Heart sounds: No murmur heard. Pulmonary:     Effort: Pulmonary effort is normal. No respiratory distress.     Breath sounds: Normal breath sounds. No wheezing or rales.  Abdominal:     General: There is no distension.     Palpations: Abdomen is soft.     Tenderness: There is no abdominal tenderness. There is no guarding or rebound.     Comments: Abdominal wound in the center abdomen with dressing still intact.  No drainage on the dressing or surrounding erythema of the wound.  G-tube present in the left upper abdomen without surrounding erythema.  Diffuse abdominal tenderness with no guarding throughout the abdomen  Musculoskeletal:        General: No tenderness. Normal range of motion.     Cervical back: Normal range of motion and neck supple.     Right lower leg: Edema present.     Left lower leg: Edema present.     Comments: Skin changes consistent with chronic venous stasis in bilateral lower extremities  Skin:    General: Skin is warm and dry.     Coloration: Skin is pale.     Findings: No erythema or rash.  Neurological:     Mental Status: She is alert and oriented to person, place, and time. Mental status is at baseline.  Psychiatric:        Behavior: Behavior  normal.     ED Results / Procedures / Treatments   Labs (all labs ordered are listed, but only abnormal results are displayed) Labs Reviewed  CBC WITH DIFFERENTIAL/PLATELET - Abnormal; Notable for the following components:      Result Value   RBC 3.04 (*)    Hemoglobin 9.1 (*)    HCT 28.2 (*)    RDW 17.7 (*)    Platelets 613 (*)    All other components within normal limits  COMPREHENSIVE  METABOLIC PANEL - Abnormal; Notable for the following components:   Sodium 133 (*)    Glucose, Bld 105 (*)    Calcium 8.3 (*)    Total Protein 6.3 (*)    Albumin 1.8 (*)    AST 46 (*)    Alkaline Phosphatase 181 (*)    All other components within normal limits  LIPASE, BLOOD    EKG None  Radiology CT ABDOMEN PELVIS W CONTRAST Result Date: 04/10/2023 CLINICAL DATA:  Abdominal pain with nausea. EXAM: CT ABDOMEN AND PELVIS WITH CONTRAST TECHNIQUE: Multidetector CT imaging of the abdomen and pelvis was performed using the standard protocol following bolus administration of intravenous contrast. RADIATION DOSE REDUCTION: This exam was performed according to the departmental dose-optimization program which includes automated exposure control, adjustment of the mA and/or kV according to patient size and/or use of iterative reconstruction technique. CONTRAST:  OMNIPAQUE IOHEXOL 300 MG/ML  SOLN COMPARISON:  CT abdomen and pelvis 03/21/2023 FINDINGS: Lower chest: Trace left pleural effusion. Hepatobiliary: Marked hepatic steatosis. Cholecystectomy. No biliary dilation. Pancreas: Unremarkable. Spleen: Unremarkable. Adrenals/Urinary Tract: Stable adrenal glands and kidneys. No urinary calculi or hydronephrosis. Unremarkable bladder. Stomach/Bowel: Postoperative change of Roux-en-Y gastric bypass. There is a percutaneous gastrostomy tube with balloon in the subcutaneous fat of the left ventral abdomen. The tip is within the anterior abdominal wall and not in the stomach. Wall thickening and mucosal  hyperenhancement about the stomach has slightly decreased from 03/21/2023. Decreased surrounding fluid and stranding. Normal caliber large and small bowel. Vascular/Lymphatic: Filling defects in the left common femoral and superficial femoral veins (circa series 2/image 81) appears similar to 03/21/2023. Mixing artifact can have this appearance however given similarity to 03/21/2023 this is concerning for DVT. Aortic atherosclerosis. No enlarged abdominal or pelvic lymph nodes. Reproductive: Hysterectomy.  No adnexal mass. Other: The previous pneumoperitoneum has resolved. Stranding along the midline abdominal wall incision compatible with postoperative change. Small fluid collections measuring up to 3.1 cm likely postoperative hematomas or seromas. Musculoskeletal: No acute fracture. IMPRESSION: 1. Percutaneous gastrostomy tube with balloon in the subcutaneous fat of the left ventral abdomen. The tip is within the anterior abdominal wall and not in the stomach. 2. Filling defect in the left common and superficial femoral arteries concerning for acute DVT. Ultrasound is recommended for confirmation. 3. Slightly decreased gastritis. Decreased surrounding fluid and stranding. The previous pneumoperitoneum has resolved. 4. Marked hepatic steatosis. 5. Trace left pleural effusion. 6. Postoperative change about the midline anterior abdomen with small hematomas or seromas. Aortic Atherosclerosis (ICD10-I70.0). These results were called by telephone at the time of interpretation on 04/10/2023 at 10:56 pm to provider [facility] , who verbally acknowledged these results. Electronically Signed   By: Minerva Fester M.D.   On: 04/10/2023 23:08    Procedures Procedures    Medications Ordered in ED Medications  ondansetron (ZOFRAN) injection 4 mg (4 mg Intravenous Given 04/10/23 2018)  morphine (PF) 4 MG/ML injection 4 mg (4 mg Intravenous Given 04/10/23 2018)  pantoprazole (PROTONIX) injection 40 mg (40 mg  Intravenous Given 04/10/23 2018)  lactated ringers bolus 1,000 mL (1,000 mLs Intravenous New Bag/Given 04/10/23 2024)  iohexol (OMNIPAQUE) 300 MG/ML solution 100 mL (100 mLs Intravenous Contrast Given 04/10/23 2225)    ED Course/ Medical Decision Making/ A&P                                 Medical Decision Making Amount and/or Complexity  of Data Reviewed Labs: ordered. Decision-making details documented in ED Course. Radiology: ordered and independent interpretation performed. Decision-making details documented in ED Course.  Risk Prescription drug management. Decision regarding hospitalization.   Pt with multiple medical problems and comorbidities and presenting today with a complaint that caries a high risk for morbidity and mortality.  Here today with return of her abdominal pain and nausea.  Patient with recent history of perforation requiring a patch who was just released from the hospital 4 days ago but surgery was in November.  Patient states care at the facility has been inconsistent.  She does have diffuse abdominal pain today and concern for recurrent ulcer and perforation versus gastritis due to not getting the meds consistently versus bowel obstruction.  No hematemesis or melena.  Low suspicion for cardiac or respiratory cause of her symptoms today.  Vital signs are reassuring.  Patient given pain and nausea control.  IV fluids given.  Labs pending and will need imaging.  11:14 PM I independently interpreted patient's labs and CBC with normal white count and hemoglobin at baseline at 9.1, CMP without acute findings, lipase within normal limits.  On reevaluation after meds patient reports feeling much better.  CT is pending.  11:14 PM I have independently visualized and interpreted pt's images today.  CT of the abdomen pelvis shows that patient's gastrostomy tube does not appear to be in the stomach.  Radiology called and reported indeed her gastrostomy tube is in her soft tissue.   No other acute findings of her abdomen.  Speaking with the patient she reports for the last 2 days any trying they tried to flush her tube it has been very painful.  They have been putting feeds in her stomach she cannot recall if it is particularly painful.  She does get medications and feeds. Spoke with Dr. Dwain Sarna.  This pt will need IR in the morning for new tube placement.  Also radiology reports most likely DVT in the femoral.            Final Clinical Impression(s) / ED Diagnoses Final diagnoses:  Dislodged gastrostomy tube    Rx / DC Orders ED Discharge Orders     None         Gwyneth Sprout, MD 04/10/23 2314

## 2023-04-10 NOTE — ED Triage Notes (Signed)
Pt BIB [facility] from [facility] for abd pain that started last PM with nausea s/p bowel surgery. SNF gave APAP without relief.   EMS Vitals  128/p HR 84 SpO2 100% on R/A  CBG 159

## 2023-04-10 NOTE — H&P (Incomplete)
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 Patient recently admitted  11/25- 12/11 with perforated ulcer at the gastrojejunal anastomosis on the right anterior side of the anastomosis.  Repaired with a [name] patch.  Discharged to SNF. Patient on continuous tube feeds at SNF.  She states for the past 3 days she has developed abdominal pain initially presents went to being flushed or with boluses.  Over the past 24 hours its become severe and continuous.  Her pain is located around the tube insertion site.  She additionally reports sudden brief intermittent episodes of shortness of breath.  These have been occurring since her last admission.  She denies chest pains.  She denies the extremity swelling or pain.  She was sent to the ER because of the abdominal pain.  In the ER CT abdomen pelvis shows percutaneous gastrostomy tube with balloon in the subcutaneous fat of the left ventral abdomen. The tip is within the anterior abdominal wall and not in the stomach. Filling defect in the left common and superficial femoral arteries concerning for acute DVT. Ultrasound is recommended for confirmation. Slightly decreased gastritis. Decreased surrounding fluid and stranding.  Admission requested  Review of Systems:  Per HPI.  Past Medical History: Past Medical History:  Diagnosis Date   Allergy    dust, pollen, sulfa, prednisone   Anemia    Anxiety    Arthritis    "knees" (04/26/2016)   Colon polyps    benign per pt   Coronary artery disease    mild per 2015 cath in [place] (OM1 30%, RCA 30%)   GERD (gastroesophageal reflux disease)    Gout    History of hiatal hernia    Hyperlipidemia 05/20/2021   Hypertension    Migraine    "none since early  /2017" (05/06/2016)   Obesity    OSA on CPAP    uses CPAP   Pre-diabetes    Pre-operative cardiovascular examination 08/22/2008   Renal insufficiency 11/09/2022   Vasculitis (HCC)    Bilateral   Past Surgical History:  Procedure Laterality Date   ABDOMINAL HYSTERECTOMY     "partial"; both ovaries present   APPENDECTOMY  05/06/2016   BIOPSY  04/08/2022   Procedure: BIOPSY;  Surgeon: Sherrilyn Rist, MD;  Location: [facility];  Service: Gastroenterology;;   CARDIAC CATHETERIZATION  ~ 2015   CARDIAC CATHETERIZATION  2015   In [place]   CHILECTOMY Right 06/01/2017   Procedure: CHILECTOMY RIGHT FOOT;  Surgeon: Felecia Shelling, DPM;  Location: [facility];  Service: Podiatry;  Laterality: Right;   CHOLECYSTECTOMY N/A 01/25/2023   Procedure: LAPAROSCOPIC CHOLECYSTECTOMY WITH INTRAOPERATIVE CHOLANGIOGRAM;  Surgeon: Quentin Ore, MD;  Location: [facility];  Service: General;  Laterality: N/A;   ESOPHAGOGASTRODUODENOSCOPY N/A 01/25/2023   Procedure: ESOPHAGOGASTRODUODENOSCOPY (EGD);  Surgeon: Quentin Ore, MD;  Location: [facility];  Service: General;  Laterality: N/A;   ESOPHAGOGASTRODUODENOSCOPY (EGD) WITH PROPOFOL N/A 04/08/2022   Procedure: ESOPHAGOGASTRODUODENOSCOPY (EGD) WITH PROPOFOL;  Surgeon: Sherrilyn Rist, MD;  Location: [facility];  Service: Gastroenterology;  Laterality: N/A;   IR GASTROSTOMY TUBE REMOVAL  03/21/2023   LAPAROSCOPIC APPENDECTOMY N/A 05/06/2016   Procedure: LAPAROSCOPIC APPENDECTOMY;  Surgeon: Manus Rudd, MD;  Location: [facility];  Service: General;  Laterality: N/A;   LAPAROSCOPIC INSERTION GASTROSTOMY TUBE N/A 01/25/2023   Procedure: LAPAROSCOPIC INSERTION REMNANT GASTROSTOMY TUBE;  Surgeon: Quentin Ore, MD;  Location: [facility];  Service: General;  Laterality: N/A;   LAPAROTOMY N/A 03/21/2023   Procedure: EXPLORATORY LAPAROTOMY, PATCH REPAIR OF GASTRIC ULCER;  Surgeon: Fritzi Mandes, MD;  Location: [facility];  Service: General;  Laterality: N/A;    POLYPECTOMY  04/08/2022   Procedure: POLYPECTOMY;  Surgeon: Sherrilyn Rist, MD;  Location: [facility];  Service: Gastroenterology;;   TONSILLECTOMY      Medications: Prior to Admission medications   Medication Sig Start Date End Date Taking? Authorizing Provider  acetaminophen (TYLENOL) 325 MG tablet Take 650 mg by mouth every 4 (four) hours as needed for mild pain or moderate pain.    [provider]  ascorbic acid (VITAMIN C) 500 MG tablet Take 500 mg by mouth daily.    [provider]  bacitracin 500 UNIT/GM ointment Apply 1 Application topically 2 (two) times daily for 7 days. Apply around G tube site. 04/06/23 04/13/23  Elease Etienne, MD  colchicine 0.6 MG tablet Take 1.2 mg by mouth daily.    [provider]  copper tablet Take 1 tablet (2 mg total) by mouth daily. 04/07/23   Hongalgi, Maximino Greenland, MD  DULoxetine (CYMBALTA) 60 MG capsule Take 1 capsule (60 mg total) by mouth daily. 12/25/21   [name], DO  feeding supplement, GLUCERNA SHAKE, (GLUCERNA SHAKE) LIQD Take 237 mLs by mouth daily. 04/06/23   Hongalgi, Maximino Greenland, MD  ferrous sulfate 325 (65 FE) MG EC tablet Take 325 mg by mouth daily with breakfast.    [provider]  folic acid (FOLVITE) 1 MG tablet Take 1 tablet (1 mg total) by mouth daily. 11/16/22   Azucena Fallen, MD  furosemide (LASIX) 20 MG tablet Take 1 tablet (20 mg total) by mouth daily. 04/07/23   Hongalgi, Maximino Greenland, MD  gabapentin (NEURONTIN) 100 MG capsule Take 1 capsule (100 mg total) by mouth 3 (three) times daily. 11/15/22   Azucena Fallen, MD  Multiple Vitamin (MULTIVITAMIN WITH MINERALS) TABS tablet Take 1 tablet by mouth daily. 11/16/22   Azucena Fallen, MD  Nutritional Supplements (FEEDING SUPPLEMENT, JEVITY 1.2 CAL,) LIQD Place 720 mLs into feeding tube continuous. 720 mL, Per Tube, at 45 mL/hr, Continuous, 04/06/23   Hongalgi, Maximino Greenland, MD  omeprazole (PRILOSEC) 20 MG capsule Take 1 capsule  (20 mg total) by mouth 2 (two) times daily before a meal. 04/06/23   Hongalgi, Maximino Greenland, MD  promethazine (PHENERGAN) 25 MG tablet Take 1 tablet (25 mg total) by mouth every 6 (six) hours as needed for nausea or vomiting. 10/27/22   [name], DO  sucralfate (CARAFATE) 1 GM/10ML suspension Take 10 mLs (1 g total) by mouth 4 (four) times daily -  with meals and at bedtime. 04/06/23   Hongalgi, Maximino Greenland, MD  thiamine (VITAMIN B-1) 100 MG tablet Take 1 tablet (100 mg total) by mouth daily. 04/06/23   Hongalgi, Maximino Greenland, MD  UNABLE TO FIND Med Name: feeding supplement (BOOST / RESOURCE BREEZE) liquid 1 Container  1 Container, Oral, 2 times daily between meals 04/06/23   Hongalgi, Maximino Greenland, MD  vitamin D3 (CHOLECALCIFEROL) 25 MCG tablet Take 5 tablets (5,000 Units total) by mouth daily. 04/06/23   Hongalgi, Maximino Greenland, MD  zinc gluconate 50 MG tablet Take 50 mg by mouth daily.    [provider]    Allergies:   Allergies  Allergen Reactions   Prednisone Swelling    Legs swell    Tape Hives   Sulfa Antibiotics Rash    Social History:  reports that she quit smoking about 44 years ago. Her smoking use included cigarettes. She started smoking about 49 years ago. She has a 0.6 pack-year smoking history. She has never used smokeless tobacco. She reports that she does not drink alcohol and does not use drugs.  Family History: Family History  Problem Relation Age of Onset   Diabetes Mother    Heart disease Mother    High blood pressure Mother    Asthma Mother    Arthritis Mother    Diabetes Father    Heart disease Father    High blood pressure Father    Asthma Father    Diabetes Sister    Alcohol abuse Brother    Drug abuse Brother    Arthritis Sister    Diabetes Sister    Varicose Veins Sister    Colon cancer Neg Hx    Esophageal cancer Neg Hx    Rectal cancer Neg Hx     Physical Exam: Vitals:   04/10/23 1937 04/10/23 2030 04/10/23 2130  BP: (!) 150/88 (!) 156/84  (!) 157/73  Pulse: 84 82 87  Resp: 18 (!) 21 17  Temp: 97.7 F (36.5 C)    TempSrc: Oral    SpO2: 100% 100% 98%    General: A and O x 3, obese, chronically ill appearing female Eyes: Pink conjunctiva, no scleral icterus ENT: Dry lips and teeth, no thyromegaly Lungs: CTA B/L, no wheeze, no crackles, no use of accessory muscles Cardiovascular: regular rate and rhythm, no regurgitation, no gallops, no murmurs. No carotid bruits, no JVD Abdomen: soft, positive BS, non-tender, PEG in place LLQ w/o signs of infection.   GU: not examined Neuro: CN II - XII grossly intact, sensation intact Musculoskeletal: strength 5/5 all extremities, no edema or notable swelling Skin: no rash, no subcutaneous crepitation, no decubitus Psych: appropriate patient  Labs on Admission:  Recent Labs    04/10/23 2018  NA 133*  K 3.6  CL 103  CO2 23  GLUCOSE 105*  BUN 9  CREATININE 0.53  CALCIUM 8.3*   Recent Labs    04/10/23 2018  AST 46*  ALT 29  ALKPHOS 181*  BILITOT 0.3  PROT 6.3*  ALBUMIN 1.8*   Recent Labs    04/10/23 2018  LIPASE 28   Recent Labs    04/10/23 2018  WBC 4.0  NEUTROABS 2.1  HGB 9.1*  HCT 28.2*  MCV 92.8  PLT 613*     Radiological Exams on Admission: CT ABDOMEN PELVIS W CONTRAST Result Date: 04/10/2023 CLINICAL DATA:  Abdominal pain with nausea. EXAM: CT ABDOMEN AND PELVIS WITH CONTRAST TECHNIQUE: Multidetector CT imaging of the abdomen and pelvis was performed using the standard protocol following bolus administration of intravenous contrast. RADIATION DOSE REDUCTION: This exam was performed according to the departmental dose-optimization program which includes automated exposure control, adjustment of the mA and/or kV according to patient size and/or use of iterative reconstruction technique. CONTRAST:  OMNIPAQUE IOHEXOL 300 MG/ML  SOLN COMPARISON:  CT abdomen and pelvis 03/21/2023 FINDINGS: Lower chest: Trace left pleural effusion. Hepatobiliary: Marked  hepatic steatosis. Cholecystectomy. No biliary dilation. Pancreas: Unremarkable. Spleen: Unremarkable. Adrenals/Urinary Tract: Stable adrenal glands and kidneys. No urinary calculi or hydronephrosis. Unremarkable bladder. Stomach/Bowel: Postoperative change of Roux-en-Y gastric bypass. There is  a percutaneous gastrostomy tube with balloon in the subcutaneous fat of the left ventral abdomen. The tip is within the anterior abdominal wall and not in the stomach. Wall thickening and mucosal hyperenhancement about the stomach has slightly decreased from 03/21/2023. Decreased surrounding fluid and stranding. Normal caliber large and small bowel. Vascular/Lymphatic: Filling defects in the left common femoral and superficial femoral veins (circa series 2/image 81) appears similar to 03/21/2023. Mixing artifact can have this appearance however given similarity to 03/21/2023 this is concerning for DVT. Aortic atherosclerosis. No enlarged abdominal or pelvic lymph nodes. Reproductive: Hysterectomy.  No adnexal mass. Other: The previous pneumoperitoneum has resolved. Stranding along the midline abdominal wall incision compatible with postoperative change. Small fluid collections measuring up to 3.1 cm likely postoperative hematomas or seromas. Musculoskeletal: No acute fracture. IMPRESSION: 1. Percutaneous gastrostomy tube with balloon in the subcutaneous fat of the left ventral abdomen. The tip is within the anterior abdominal wall and not in the stomach. 2. Filling defect in the left common and superficial femoral arteries concerning for acute DVT. Ultrasound is recommended for confirmation. 3. Slightly decreased gastritis. Decreased surrounding fluid and stranding. The previous pneumoperitoneum has resolved. 4. Marked hepatic steatosis. 5. Trace left pleural effusion. 6. Postoperative change about the midline anterior abdomen with small hematomas or seromas. Aortic Atherosclerosis (ICD10-I70.0). These results were called by  telephone at the time of interpretation on 04/10/2023 at 10:56 pm to provider [facility] , who verbally acknowledged these results. Electronically Signed   By: Minerva Fester M.D.   On: 04/10/2023 23:08    Assessment/Plan Present on Admission:  Dislodged gastrostomy tube -IR consulted for for adjustment of G-tube -N.p.o., gentle IV fluid hydration -Lasix on hold.  Patient clinically dehydrated -As needed pain medications   DVT left common and superficial femoral arteries  -Noted on CT abdomen/pelvis.  Follow-up with Doppler left lower extremity. -Heparin drip initiated by EDP, continued -CTA chest to be done in 24 hours.  Delayed d/t the CT abdomen with contrast administration -2D echo based CTA chest result   Gastritis -Continue Protonix IV twice daily -Holding sucralfate   GAD (generalized anxiety disorder)  -Cymbalta on hold   Gout -Continue colchicine  Vrinda Heckstall 04/10/2023, 11:46 PM

## 2023-04-10 NOTE — ED Notes (Signed)
Patient transported to CT 

## 2023-04-11 ENCOUNTER — Inpatient Hospital Stay (HOSPITAL_COMMUNITY): Payer: Medicare HMO

## 2023-04-11 DIAGNOSIS — I82401 Acute embolism and thrombosis of unspecified deep veins of right lower extremity: Secondary | ICD-10-CM | POA: Diagnosis not present

## 2023-04-11 DIAGNOSIS — R627 Adult failure to thrive: Secondary | ICD-10-CM | POA: Diagnosis not present

## 2023-04-11 DIAGNOSIS — K9429 Other complications of gastrostomy: Secondary | ICD-10-CM | POA: Diagnosis not present

## 2023-04-11 DIAGNOSIS — R269 Unspecified abnormalities of gait and mobility: Secondary | ICD-10-CM | POA: Diagnosis not present

## 2023-04-11 DIAGNOSIS — T85528A Displacement of other gastrointestinal prosthetic devices, implants and grafts, initial encounter: Secondary | ICD-10-CM | POA: Diagnosis not present

## 2023-04-11 DIAGNOSIS — M109 Gout, unspecified: Secondary | ICD-10-CM | POA: Diagnosis not present

## 2023-04-11 DIAGNOSIS — I82412 Acute embolism and thrombosis of left femoral vein: Secondary | ICD-10-CM | POA: Diagnosis not present

## 2023-04-11 DIAGNOSIS — M7989 Other specified soft tissue disorders: Secondary | ICD-10-CM

## 2023-04-11 DIAGNOSIS — Z87891 Personal history of nicotine dependence: Secondary | ICD-10-CM | POA: Diagnosis not present

## 2023-04-11 DIAGNOSIS — K219 Gastro-esophageal reflux disease without esophagitis: Secondary | ICD-10-CM | POA: Diagnosis not present

## 2023-04-11 DIAGNOSIS — E872 Acidosis, unspecified: Secondary | ICD-10-CM | POA: Diagnosis not present

## 2023-04-11 DIAGNOSIS — Z8249 Family history of ischemic heart disease and other diseases of the circulatory system: Secondary | ICD-10-CM | POA: Diagnosis not present

## 2023-04-11 DIAGNOSIS — E860 Dehydration: Secondary | ICD-10-CM | POA: Diagnosis not present

## 2023-04-11 DIAGNOSIS — Z6841 Body Mass Index (BMI) 40.0 and over, adult: Secondary | ICD-10-CM | POA: Diagnosis not present

## 2023-04-11 DIAGNOSIS — E785 Hyperlipidemia, unspecified: Secondary | ICD-10-CM | POA: Diagnosis not present

## 2023-04-11 DIAGNOSIS — R918 Other nonspecific abnormal finding of lung field: Secondary | ICD-10-CM | POA: Diagnosis not present

## 2023-04-11 DIAGNOSIS — Z66 Do not resuscitate: Secondary | ICD-10-CM | POA: Diagnosis not present

## 2023-04-11 DIAGNOSIS — E8809 Other disorders of plasma-protein metabolism, not elsewhere classified: Secondary | ICD-10-CM | POA: Diagnosis not present

## 2023-04-11 DIAGNOSIS — Z882 Allergy status to sulfonamides status: Secondary | ICD-10-CM | POA: Diagnosis not present

## 2023-04-11 DIAGNOSIS — Z993 Dependence on wheelchair: Secondary | ICD-10-CM | POA: Diagnosis not present

## 2023-04-11 DIAGNOSIS — K9423 Gastrostomy malfunction: Secondary | ICD-10-CM | POA: Diagnosis not present

## 2023-04-11 DIAGNOSIS — Z79899 Other long term (current) drug therapy: Secondary | ICD-10-CM | POA: Diagnosis not present

## 2023-04-11 DIAGNOSIS — I824Y9 Acute embolism and thrombosis of unspecified deep veins of unspecified proximal lower extremity: Secondary | ICD-10-CM | POA: Diagnosis not present

## 2023-04-11 DIAGNOSIS — R109 Unspecified abdominal pain: Secondary | ICD-10-CM | POA: Diagnosis not present

## 2023-04-11 DIAGNOSIS — F411 Generalized anxiety disorder: Secondary | ICD-10-CM | POA: Diagnosis not present

## 2023-04-11 DIAGNOSIS — I10 Essential (primary) hypertension: Secondary | ICD-10-CM | POA: Diagnosis not present

## 2023-04-11 DIAGNOSIS — R0602 Shortness of breath: Secondary | ICD-10-CM | POA: Diagnosis not present

## 2023-04-11 DIAGNOSIS — I2510 Atherosclerotic heart disease of native coronary artery without angina pectoris: Secondary | ICD-10-CM | POA: Diagnosis not present

## 2023-04-11 DIAGNOSIS — I517 Cardiomegaly: Secondary | ICD-10-CM | POA: Diagnosis not present

## 2023-04-11 DIAGNOSIS — D649 Anemia, unspecified: Secondary | ICD-10-CM | POA: Diagnosis not present

## 2023-04-11 DIAGNOSIS — K911 Postgastric surgery syndromes: Secondary | ICD-10-CM | POA: Diagnosis not present

## 2023-04-11 DIAGNOSIS — G4733 Obstructive sleep apnea (adult) (pediatric): Secondary | ICD-10-CM | POA: Diagnosis not present

## 2023-04-11 HISTORY — PX: IR REPLC GASTRO/COLONIC TUBE PERCUT W/FLUORO: IMG2333

## 2023-04-11 LAB — CBC WITH DIFFERENTIAL/PLATELET
Abs Immature Granulocytes: 0.03 10*3/uL (ref 0.00–0.07)
Basophils Absolute: 0.1 10*3/uL (ref 0.0–0.1)
Basophils Relative: 1 %
Eosinophils Absolute: 0.1 10*3/uL (ref 0.0–0.5)
Eosinophils Relative: 1 %
HCT: 27.8 % — ABNORMAL LOW (ref 36.0–46.0)
Hemoglobin: 9.1 g/dL — ABNORMAL LOW (ref 12.0–15.0)
Immature Granulocytes: 1 %
Lymphocytes Relative: 47 %
Lymphs Abs: 2.2 10*3/uL (ref 0.7–4.0)
MCH: 30.4 pg (ref 26.0–34.0)
MCHC: 32.7 g/dL (ref 30.0–36.0)
MCV: 93 fL (ref 80.0–100.0)
Monocytes Absolute: 0.4 10*3/uL (ref 0.1–1.0)
Monocytes Relative: 10 %
Neutro Abs: 1.9 10*3/uL (ref 1.7–7.7)
Neutrophils Relative %: 40 %
Platelets: 581 10*3/uL — ABNORMAL HIGH (ref 150–400)
RBC: 2.99 MIL/uL — ABNORMAL LOW (ref 3.87–5.11)
RDW: 17.9 % — ABNORMAL HIGH (ref 11.5–15.5)
WBC: 4.6 10*3/uL (ref 4.0–10.5)
nRBC: 0 % (ref 0.0–0.2)

## 2023-04-11 LAB — COMPREHENSIVE METABOLIC PANEL
ALT: 28 U/L (ref 0–44)
AST: 50 U/L — ABNORMAL HIGH (ref 15–41)
Albumin: 2 g/dL — ABNORMAL LOW (ref 3.5–5.0)
Alkaline Phosphatase: 169 U/L — ABNORMAL HIGH (ref 38–126)
Anion gap: 6 (ref 5–15)
BUN: 10 mg/dL (ref 8–23)
CO2: 24 mmol/L (ref 22–32)
Calcium: 8.3 mg/dL — ABNORMAL LOW (ref 8.9–10.3)
Chloride: 103 mmol/L (ref 98–111)
Creatinine, Ser: 0.61 mg/dL (ref 0.44–1.00)
GFR, Estimated: >60 mL/min (ref >60)
Glucose, Bld: 80 mg/dL (ref 70–99)
Potassium: 3.9 mmol/L (ref 3.5–5.1)
Sodium: 133 mmol/L — ABNORMAL LOW (ref 135–145)
Total Bilirubin: 0.6 mg/dL (ref <1.2)
Total Protein: 6.6 g/dL (ref 6.5–8.1)

## 2023-04-11 LAB — TSH: TSH: 2.776 u[IU]/mL (ref 0.350–4.500)

## 2023-04-11 LAB — HEPARIN LEVEL (UNFRACTIONATED): Heparin Unfractionated: 0.23 [IU]/mL — ABNORMAL LOW (ref 0.30–0.70)

## 2023-04-11 MED ORDER — IOHEXOL 300 MG/ML  SOLN
50.0000 mL | Freq: Once | INTRAMUSCULAR | Status: AC | PRN
  Administered 2023-04-11: 15 mL

## 2023-04-11 MED ORDER — DEXTROSE-SODIUM CHLORIDE 5-0.45 % IV SOLN
INTRAVENOUS | Status: AC
Start: 2023-04-11 — End: 2023-04-12

## 2023-04-11 MED ORDER — ONDANSETRON HCL 4 MG PO TABS: 4.0000 mg | ORAL_TABLET | Freq: Four times a day (QID) | ORAL | Status: DC | PRN

## 2023-04-11 MED ORDER — HYDRALAZINE HCL 20 MG/ML IJ SOLN: 10.0000 mg | INTRAMUSCULAR | Status: DC | PRN

## 2023-04-11 MED ORDER — ACETAMINOPHEN 325 MG PO TABS: 650.0000 mg | ORAL_TABLET | Freq: Four times a day (QID) | ORAL | Status: DC | PRN

## 2023-04-11 MED ORDER — IPRATROPIUM-ALBUTEROL 0.5-2.5 (3) MG/3ML IN SOLN: 3.0000 mL | RESPIRATORY_TRACT | Status: DC | PRN

## 2023-04-11 MED ORDER — SENNOSIDES-DOCUSATE SODIUM 8.6-50 MG PO TABS: 1.0000 | ORAL_TABLET | Freq: Every evening | ORAL | Status: DC | PRN

## 2023-04-11 MED ORDER — ONDANSETRON HCL 4 MG/2ML IJ SOLN: 4.0000 mg | Freq: Four times a day (QID) | INTRAMUSCULAR | Status: DC | PRN

## 2023-04-11 MED ORDER — METOPROLOL TARTRATE 5 MG/5ML IV SOLN: 5.0000 mg | INTRAVENOUS | Status: DC | PRN

## 2023-04-11 MED ORDER — POTASSIUM CHLORIDE IN NACL 20-0.9 MEQ/L-% IV SOLN
INTRAVENOUS | Status: DC
  Filled 2023-04-11: qty 1000

## 2023-04-11 MED ORDER — HYDROMORPHONE HCL 1 MG/ML IJ SOLN
0.5000 mg | INTRAMUSCULAR | Status: DC | PRN
  Administered 2023-04-12: 0.5 mg via INTRAVENOUS
  Filled 2023-04-11: qty 0.5

## 2023-04-11 MED ORDER — OXYCODONE HCL 5 MG PO TABS
5.0000 mg | ORAL_TABLET | ORAL | Status: DC | PRN
  Administered 2023-04-13 – 2023-04-14 (×2): 5 mg via ORAL
  Filled 2023-04-11 (×2): qty 1

## 2023-04-11 MED ORDER — ONDANSETRON HCL 4 MG/2ML IJ SOLN
4.0000 mg | Freq: Four times a day (QID) | INTRAMUSCULAR | Status: DC | PRN
  Administered 2023-04-21: 4 mg via INTRAVENOUS
  Filled 2023-04-11: qty 2

## 2023-04-11 MED ORDER — IOHEXOL 350 MG/ML SOLN
75.0000 mL | Freq: Once | INTRAVENOUS | Status: AC | PRN
  Administered 2023-04-12: 75 mL via INTRAVENOUS

## 2023-04-11 MED ORDER — POTASSIUM CHLORIDE IN NACL 20-0.9 MEQ/L-% IV SOLN: INTRAVENOUS | Status: DC

## 2023-04-11 MED ORDER — ACETAMINOPHEN 650 MG RE SUPP: 650.0000 mg | Freq: Four times a day (QID) | RECTAL | Status: DC | PRN

## 2023-04-11 MED ORDER — LIDOCAINE VISCOUS HCL 2 % MT SOLN
OROMUCOSAL | Status: AC
  Filled 2023-04-11: qty 15

## 2023-04-11 MED ORDER — GUAIFENESIN 100 MG/5ML PO LIQD: 5.0000 mL | ORAL | Status: DC | PRN

## 2023-04-11 NOTE — Progress Notes (Signed)
PHARMACY - ANTICOAGULATION CONSULT NOTE  Pharmacy Consult for heparin  Indication: acute DVT  Allergies  Allergen Reactions   Prednisone Swelling    Legs swell    Tape Hives   Sulfa Antibiotics Rash    Patient Measurements: Height: 5\' 1"  (154.9 cm) Weight: 105.9 kg (233 lb 7.5 oz) IBW/kg (Calculated) : 47.8 Heparin Dosing Weight: 74 kg  Vital Signs: Temp: 97.8 F (36.6 C) (12/16 1354) Temp Source: Oral (12/16 1012) BP: 129/84 (12/16 1730) Pulse Rate: 89 (12/16 1730)  Labs: Recent Labs    04/10/23 2018 04/11/23 0615 04/11/23 0917  HGB 9.1* 9.1*  --   HCT 28.2* 27.8*  --   PLT 613* 581*  --   HEPARINUNFRC  --   --  0.23*  CREATININE 0.53 0.61  --     Estimated Creatinine Clearance: 73.3 mL/min (by C-G formula based on SCr of 0.61 mg/dL).   Medical History: Past Medical History:  Diagnosis Date   Allergy    dust, pollen, sulfa, prednisone   Anemia    Anxiety    Arthritis    "knees" (04/26/2016)   Colon polyps    benign per pt   Coronary artery disease    mild per 2015 cath in [place] (OM1 30%, RCA 30%)   GERD (gastroesophageal reflux disease)    Gout    History of hiatal hernia    Hyperlipidemia 05/20/2021   Hypertension    Migraine    "none since early /2017" (05/06/2016)   Obesity    OSA on CPAP    uses CPAP   Pre-diabetes    Pre-operative cardiovascular examination 08/22/2008   Renal insufficiency 11/09/2022   Vasculitis (HCC)    Bilateral     Assessment: Patient is a 72 y.o F with G-tube who was recently hospitalized from 03/21/23 to 04/06/23 for perforated marginal ulcer. She underwent exp lap abdominal washout, Graham patch repair of perforated gastrojejunal ulcer, and intraperitoneal drain placement on 03/21/23. She presented back to the ED on 04/10/23 with c/o abdominal pain and nausea. Abdominal CT on 04/10/23 showed dislodged G-tube and findings with concern for DVT in the left common and superficial femoral arteries.  LE doppler on  04/11/23 resulted back with acute DVT in the left common femoral vein.   She is currently on heparin drip for acute VTE.  Today, 04/11/2023: - Heparin off at 17:35 for IR procedure - No bleeding or complications documented  - 18:33 IR completed Gastrostomy tube replacement under fluoro.  Dr Fredia Sorrow ok'd to resume heparin.    Goal of Therapy:  Heparin level 0.3-0.7 units/ml Monitor platelets by anticoagulation protocol: Yes   Plan:  - Resume heparin drip at 1350 units/hr - check 8 hr heparin level  - monitor for s/sx bleeding  - follow up long-term anticoagulation plans  Lynann Beaver PharmD, BCPS [facility] main pharmacy [phone] 04/11/2023 6:42 PM

## 2023-04-11 NOTE — Progress Notes (Signed)
Subjective/Chief Complaint: S/p elap, graham patch marginal ulcer by [place] 11/25, at snf and g tube came out- this was IR placed in remnant s/p bypass.  She thinks the facility was using it this week. Had ab pain in last couple days and was brought to er.  Ct shows g tube out of place.  She has no complaints when I see her   Objective: Vital signs in last 24 hours: Temp:  [97.7 F (36.5 C)-98 F (36.7 C)] 97.9 F (36.6 C) (12/16 0600) Pulse Rate:  [82-94] 94 (12/16 0600) Resp:  [16-21] 16 (12/16 0600) BP: (129-157)/(65-88) 137/65 (12/16 0600) SpO2:  [98 %-100 %] 100 % (12/16 0600)    Intake/Output from previous day: 12/15 0701 - 12/16 0700 In: 1000 [IV Piggyback:1000] Out: -  Intake/Output this shift: Total I/O In: 1000 [IV Piggyback:1000] Out: -   Ab soft nontender g tube still in place  Lab Results:  Recent Labs    04/10/23 2018 04/11/23 0615  WBC 4.0 4.6  HGB 9.1* 9.1*  HCT 28.2* 27.8*  PLT 613* 581*   BMET Recent Labs    04/10/23 2018  NA 133*  K 3.6  CL 103  CO2 23  GLUCOSE 105*  BUN 9  CREATININE 0.53  CALCIUM 8.3*   PT/INR No results for input(s): "LABPROT", "INR" in the last 72 hours. ABG No results for input(s): "PHART", "HCO3" in the last 72 hours.  Invalid input(s): "PCO2", "PO2"  Studies/Results: CT ABDOMEN PELVIS W CONTRAST Result Date: 04/10/2023 CLINICAL DATA:  Abdominal pain with nausea. EXAM: CT ABDOMEN AND PELVIS WITH CONTRAST TECHNIQUE: Multidetector CT imaging of the abdomen and pelvis was performed using the standard protocol following bolus administration of intravenous contrast. RADIATION DOSE REDUCTION: This exam was performed according to the departmental dose-optimization program which includes automated exposure control, adjustment of the mA and/or kV according to patient size and/or use of iterative reconstruction technique. CONTRAST:  OMNIPAQUE IOHEXOL 300 MG/ML  SOLN COMPARISON:  CT abdomen and pelvis 03/21/2023  FINDINGS: Lower chest: Trace left pleural effusion. Hepatobiliary: Marked hepatic steatosis. Cholecystectomy. No biliary dilation. Pancreas: Unremarkable. Spleen: Unremarkable. Adrenals/Urinary Tract: Stable adrenal glands and kidneys. No urinary calculi or hydronephrosis. Unremarkable bladder. Stomach/Bowel: Postoperative change of Roux-en-Y gastric bypass. There is a percutaneous gastrostomy tube with balloon in the subcutaneous fat of the left ventral abdomen. The tip is within the anterior abdominal wall and not in the stomach. Wall thickening and mucosal hyperenhancement about the stomach has slightly decreased from 03/21/2023. Decreased surrounding fluid and stranding. Normal caliber large and small bowel. Vascular/Lymphatic: Filling defects in the left common femoral and superficial femoral veins (circa series 2/image 81) appears similar to 03/21/2023. Mixing artifact can have this appearance however given similarity to 03/21/2023 this is concerning for DVT. Aortic atherosclerosis. No enlarged abdominal or pelvic lymph nodes. Reproductive: Hysterectomy.  No adnexal mass. Other: The previous pneumoperitoneum has resolved. Stranding along the midline abdominal wall incision compatible with postoperative change. Small fluid collections measuring up to 3.1 cm likely postoperative hematomas or seromas. Musculoskeletal: No acute fracture. IMPRESSION: 1. Percutaneous gastrostomy tube with balloon in the subcutaneous fat of the left ventral abdomen. The tip is within the anterior abdominal wall and not in the stomach. 2. Filling defect in the left common and superficial femoral arteries concerning for acute DVT. Ultrasound is recommended for confirmation. 3. Slightly decreased gastritis. Decreased surrounding fluid and stranding. The previous pneumoperitoneum has resolved. 4. Marked hepatic steatosis. 5. Trace left pleural effusion. 6. Postoperative  change about the midline anterior abdomen with small hematomas or  seromas. Aortic Atherosclerosis (ICD10-I70.0). These results were called by telephone at the time of interpretation on 04/10/2023 at 10:56 pm to provider [facility] , who verbally acknowledged these results. Electronically Signed   By: Minerva Fester M.D.   On: 04/10/2023 23:08    Anti-infectives: Anti-infectives (From admission, onward)    None       Assessment/Plan: S/P elap/marginal ulcer patch S/p bypass with remnant g tube now dislodged (again) -she is not ill and does not appear to need laparotomy -recommend IR replacement of G tube as soon as possible -evaluate for possible dvt   Emelia Loron 04/11/2023

## 2023-04-11 NOTE — Progress Notes (Signed)
PROGRESS NOTE    Christine [name]  WUJ:[id number] DOB: [birth year] DOA: 04/10/2023 PCP: Pcp, No    Brief Narrative:   72 year old with history of HTN, HLD, OSA on CPAP, GERD, anxiety, morbid obesity, esophageal dysmotility with gastric bypass 2024, failure to thrive with G-tube placement, sacral wound, ESBL UTI recently admitted from 11/25 - 12/11 for perforated ulcer at gastrojejunal anastomosis with anterior side anastomosis repaired with [name] patch and discharged to SNF.  Over past 3 days she has developed more abdominal pain located at the tube insertion site.  CT abdomen pelvis shows percutaneous G-tube with balloon in the subcutaneous fat.  Concerning of filling defect in the left superficial femoral vein concerning for DVT which is confirmed on the ultrasound.  Assessment & Plan:  Active Problems:   GAD (generalized anxiety disorder)   Gout   Failure to thrive in adult   Dislodged gastrostomy tube    Dislodged gastrostomy tube status post recent perforated marginal ulcer DVT in right femoral -IR consulted for replacement of G-tube. -Currently n.p.o. - On heparin drip. -CT scan shows concerning for possible arterial clot but on the ultrasound it confirms the DVT.  Have asked radiology to confirm an addendum this  Gastritis -Continue Protonix IV twice daily -Holding sucralfate   General Anxiety disorder -Cymbalta on hold  Normocytic anemia - Hemoglobin around baseline of 9.   Gout -Continue colchicine  DVT prophylaxis: Hep Drip Code Status: DNR Family Communication:   Status is: Inpatient Remains inpatient appropriate because: G tube repalcement today, then swtich po AC when stable    Subjective: No complaints,  Ok for now.    Examination:  General exam: Appears calm and comfortable  Respiratory system: Clear to auscultation. Respiratory effort normal. Cardiovascular system: S1 & S2 heard, RRR. No JVD, murmurs, rubs, gallops or clicks. No pedal  edema. Gastrointestinal system: Abdomen is nondistended, soft and nontender. No organomegaly or masses felt. Normal bowel sounds heard. Central nervous system: Alert and oriented. No focal neurological deficits. Extremities: Symmetric 5 x 5 power. Skin: No rashes, lesions or ulcers Psychiatry: Judgement and insight appear normal. Mood & affect appropriate. G tube in place               Diet Orders (From admission, onward)     Start     Ordered   04/11/23 0037  Diet NPO time specified Except for: Sips with Meds  Diet effective now       Question:  Except for  Answer:  Sips with Meds   04/11/23 0036            Objective: Vitals:   04/11/23 1012 04/11/23 1100 04/11/23 1230 04/11/23 1300  BP: (!) 140/72 137/67 135/65 139/79  Pulse: 93 96 99 100  Resp: 18 18 16 16   Temp: 98 F (36.7 C)     TempSrc: Oral     SpO2: 100% 100% 90% 100%  Weight:      Height:        Intake/Output Summary (Last 24 hours) at 04/11/2023 1335 Last data filed at 04/11/2023 1021 Gross per 24 hour  Intake 1486.66 ml  Output --  Net 1486.66 ml   Filed Weights   04/11/23 0803  Weight: 105.9 kg    Scheduled Meds: Continuous Infusions:  dextrose 5 % and 0.45 % NaCl 50 mL/hr at 04/11/23 1030   heparin 1,350 Units/hr (04/11/23 1034)    Nutritional status     Body mass index is 44.11 kg/m.  Data Reviewed:  CBC: Recent Labs  Lab 04/10/23 2018 04/11/23 0615  WBC 4.0 4.6  NEUTROABS 2.1 1.9  HGB 9.1* 9.1*  HCT 28.2* 27.8*  MCV 92.8 93.0  PLT 613* 581*   Basic Metabolic Panel: Recent Labs  Lab 04/05/23 0402 04/10/23 2018 04/11/23 0615  NA 135 133* 133*  K 4.0 3.6 3.9  CL 108 103 103  CO2 20* 23 24  GLUCOSE 84 105* 80  BUN 13 9 10   CREATININE 0.48 0.53 0.61  CALCIUM 7.8* 8.3* 8.3*  MG 1.8  --   --   PHOS 3.5  --   --    GFR: Estimated Creatinine Clearance: 73.3 mL/min (by C-G formula based on SCr of 0.61 mg/dL). Liver Function Tests: Recent Labs  Lab  04/10/23 2018 04/11/23 0615  AST 46* 50*  ALT 29 28  ALKPHOS 181* 169*  BILITOT 0.3 0.6  PROT 6.3* 6.6  ALBUMIN 1.8* 2.0*   Recent Labs  Lab 04/10/23 2018  LIPASE 28   No results for input(s): "AMMONIA" in the last 168 hours. Coagulation Profile: No results for input(s): "INR", "PROTIME" in the last 168 hours. Cardiac Enzymes: No results for input(s): "CKTOTAL", "CKMB", "CKMBINDEX", "TROPONINI" in the last 168 hours. BNP (last 3 results) No results for input(s): "PROBNP" in the last 8760 hours. HbA1C: No results for input(s): "HGBA1C" in the last 72 hours. CBG: Recent Labs  Lab 04/05/23 1918 04/06/23 0016 04/06/23 0516 04/06/23 0739 04/06/23 1124  GLUCAP 103* 83 83 100* 92   Lipid Profile: No results for input(s): "CHOL", "HDL", "LDLCALC", "TRIG", "CHOLHDL", "LDLDIRECT" in the last 72 hours. Thyroid Function Tests: No results for input(s): "TSH", "T4TOTAL", "FREET4", "T3FREE", "THYROIDAB" in the last 72 hours. Anemia Panel: No results for input(s): "VITAMINB12", "FOLATE", "FERRITIN", "TIBC", "IRON", "RETICCTPCT" in the last 72 hours. Sepsis Labs: No results for input(s): "PROCALCITON", "LATICACIDVEN" in the last 168 hours.  No results found for this or any previous visit (from the past 240 hours).       Radiology Studies: VAS [place] LOWER EXTREMITY VENOUS (DVT) Result Date: 04/11/2023  Lower Venous DVT Study Patient Name:  EMARY FRAGOSO  Date of Exam:   04/11/2023 Medical Rec #: [id number]              Accession #:    [id number] Date of Birth: 12-Oct-1950             Patient Gender: F Patient Age:   20 years Exam Location:  [facility] Procedure:      VAS [place] LOWER EXTREMITY VENOUS (DVT) Referring Phys: DEBBY CROSLEY --------------------------------------------------------------------------------  Indications: Swelling.  Anticoagulation: Heparin. Limitations: Poor ultrasound/tissue interface and body habitus. Comparison Study: 04/10/2023 - CT ABDOMEN  PELVIS W CONTRAST                    IMPRESSION:                   1. Percutaneous gastrostomy tube with balloon in the                   subcutaneous                   fat of the left ventral abdomen. The tip is within the                   anterior                   abdominal wall  and not in the stomach.                   2. Filling defect in the left common and superficial femoral                   arteries concerning for acute DVT. Ultrasound is recommended                   for                   confirmation.                   3. Slightly decreased gastritis. Decreased surrounding fluid                   and                   stranding. The previous pneumoperitoneum has resolved.                   4. Marked hepatic steatosis.                   5. Trace left pleural effusion.                   6. Postoperative change about the midline anterior abdomen                   with                   small hematomas or seromas. Performing Technologist: Chanda Busing RVT  Examination Guidelines: A complete evaluation includes B-mode imaging, spectral Doppler, color Doppler, and power Doppler as needed of all accessible portions of each vessel. Bilateral testing is considered an integral part of a complete examination. Limited examinations for reoccurring indications may be performed as noted. The reflux portion of the exam is performed with the patient in reverse Trendelenburg.  +---------+---------------+---------+-----------+----------+-------------------+ RIGHT    CompressibilityPhasicitySpontaneityPropertiesThrombus Aging      +---------+---------------+---------+-----------+----------+-------------------+ CFV      Full           Yes      Yes                                      +---------+---------------+---------+-----------+----------+-------------------+ SFJ      Full                                                              +---------+---------------+---------+-----------+----------+-------------------+ FV Prox  Full                                                             +---------+---------------+---------+-----------+----------+-------------------+ FV Mid   Full                                                             +---------+---------------+---------+-----------+----------+-------------------+  FV DistalFull                                                             +---------+---------------+---------+-----------+----------+-------------------+ PFV      Full                                                             +---------+---------------+---------+-----------+----------+-------------------+ POP      Full           Yes      Yes                                      +---------+---------------+---------+-----------+----------+-------------------+ PTV      Full                                                             +---------+---------------+---------+-----------+----------+-------------------+ PERO                                                  Not well visualized +---------+---------------+---------+-----------+----------+-------------------+   +---------+---------------+---------+-----------+----------+--------------+ LEFT     CompressibilityPhasicitySpontaneityPropertiesThrombus Aging +---------+---------------+---------+-----------+----------+--------------+ CFV      Partial        No       No                   Acute          +---------+---------------+---------+-----------+----------+--------------+ SFJ      Full                                                        +---------+---------------+---------+-----------+----------+--------------+ FV Prox  Full                                                        +---------+---------------+---------+-----------+----------+--------------+ FV Mid   Full                                                         +---------+---------------+---------+-----------+----------+--------------+ FV DistalFull                                                        +---------+---------------+---------+-----------+----------+--------------+  PFV      Full                                                        +---------+---------------+---------+-----------+----------+--------------+ POP      Full           Yes      Yes                                 +---------+---------------+---------+-----------+----------+--------------+ PTV      Full                                                        +---------+---------------+---------+-----------+----------+--------------+ PERO     Full                                                        +---------+---------------+---------+-----------+----------+--------------+ EIV                     Yes      Yes                                 +---------+---------------+---------+-----------+----------+--------------+    Summary: RIGHT: - There is no evidence of deep vein thrombosis in the lower extremity. However, portions of this examination were limited- see technologist comments above.  - No cystic structure found in the popliteal fossa.  LEFT: - Findings consistent with acute deep vein thrombosis involving the left common femoral vein.  - No cystic structure found in the popliteal fossa.  *See table(s) above for measurements and observations.    Preliminary    CT ABDOMEN PELVIS W CONTRAST Result Date: 04/10/2023 CLINICAL DATA:  Abdominal pain with nausea. EXAM: CT ABDOMEN AND PELVIS WITH CONTRAST TECHNIQUE: Multidetector CT imaging of the abdomen and pelvis was performed using the standard protocol following bolus administration of intravenous contrast. RADIATION DOSE REDUCTION: This exam was performed according to the departmental dose-optimization program which includes automated exposure control, adjustment of the mA  and/or kV according to patient size and/or use of iterative reconstruction technique. CONTRAST:  OMNIPAQUE IOHEXOL 300 MG/ML  SOLN COMPARISON:  CT abdomen and pelvis 03/21/2023 FINDINGS: Lower chest: Trace left pleural effusion. Hepatobiliary: Marked hepatic steatosis. Cholecystectomy. No biliary dilation. Pancreas: Unremarkable. Spleen: Unremarkable. Adrenals/Urinary Tract: Stable adrenal glands and kidneys. No urinary calculi or hydronephrosis. Unremarkable bladder. Stomach/Bowel: Postoperative change of Roux-en-Y gastric bypass. There is a percutaneous gastrostomy tube with balloon in the subcutaneous fat of the left ventral abdomen. The tip is within the anterior abdominal wall and not in the stomach. Wall thickening and mucosal hyperenhancement about the stomach has slightly decreased from 03/21/2023. Decreased surrounding fluid and stranding. Normal caliber large and small bowel. Vascular/Lymphatic: Filling defects in the left common femoral and superficial femoral veins (circa series 2/image 81) appears similar to 03/21/2023. Mixing artifact can have this appearance  however given similarity to 03/21/2023 this is concerning for DVT. Aortic atherosclerosis. No enlarged abdominal or pelvic lymph nodes. Reproductive: Hysterectomy.  No adnexal mass. Other: The previous pneumoperitoneum has resolved. Stranding along the midline abdominal wall incision compatible with postoperative change. Small fluid collections measuring up to 3.1 cm likely postoperative hematomas or seromas. Musculoskeletal: No acute fracture. IMPRESSION: 1. Percutaneous gastrostomy tube with balloon in the subcutaneous fat of the left ventral abdomen. The tip is within the anterior abdominal wall and not in the stomach. 2. Filling defect in the left common and superficial femoral arteries concerning for acute DVT. Ultrasound is recommended for confirmation. 3. Slightly decreased gastritis. Decreased surrounding fluid and stranding. The  previous pneumoperitoneum has resolved. 4. Marked hepatic steatosis. 5. Trace left pleural effusion. 6. Postoperative change about the midline anterior abdomen with small hematomas or seromas. Aortic Atherosclerosis (ICD10-I70.0). These results were called by telephone at the time of interpretation on 04/10/2023 at 10:56 pm to provider [facility] , who verbally acknowledged these results. Electronically Signed   By: Minerva Fester M.D.   On: 04/10/2023 23:08           LOS: 0 days   Time spent= 35 mins    Miguel Rota, MD [facility]  If 7PM-7AM, please contact night-coverage  04/11/2023, 1:35 PM

## 2023-04-11 NOTE — ED Notes (Signed)
Report called  

## 2023-04-11 NOTE — Procedures (Signed)
Interventional Radiology Procedure Note  Procedure: Gastrostomy tube replacement under fluoro  Complications: None  Estimated Blood Loss: None  Findings: After tract catheterized, new 18 Fr balloon retention gastrostomy tube advanced into gastric remnant. OK to use.  Jodi Marble. Fredia Sorrow, M.D Pager:  [phone]

## 2023-04-11 NOTE — Hospital Course (Addendum)
The patient is a 72 year old with history of HTN, HLD, OSA on CPAP, GERD, anxiety, morbid obesity, esophageal dysmotility with gastric bypass 2024, failure to thrive with G-tube placement, sacral wound, ESBL UTI recently admitted from 11/25 - 12/11 for perforated ulcer at gastrojejunal anastomosis with anterior side anastomosis repaired with [name] patch and discharged to SNF.  Over past 3 days she has developed more abdominal pain located at the tube insertion site.  CT abdomen pelvis shows percutaneous G-tube with balloon in the subcutaneous fat.  Concerning of filling defect in the left superficial femoral vein concerning for DVT which is confirmed on the ultrasound. IR replaced G-tube on 12/16.  Transition heparin drip to p.o. Eliquis.  Awaiting SNF placement  **12/19-12/20 had some issues with he G tube leaking so IR was re-consulted.  Subsequently the leaking has diminished and following discussion with interventional radiologist the patient made a decision not to pursue any further intervention at this time.  She is medically stable for D/C to SNF but awaiting English as a second language teacher.   Assessment & Plan:  Active Problems:   GAD (generalized anxiety disorder)   Gout   Failure to thrive in adult   Dislodged gastrostomy tube   Dislodged gastrostomy tube status post recent perforated marginal ulcer DVT in right femoral -IR replaced G-tube on 12/16, dietitian following.  Slowly uptitrate over time oral intake and down the road tube feeds can be potentially discontinued.  -Now on Eliquis (new med) -CT scan shows concerning for possible arterial clot but on the ultrasound it confirms the DVT.  Discussed with radiology, this is a typo in terms of arterial clot.  Indeed this is venous clot -C/w Pain control with Acteaminophen 650 mg po q6hprn Mild Pain, Tramadol 50 mg po q6hprn Moderate Pain, and Oxycodone IR 5 mg po q4hprn Severe Pain and C/w IV Hydromorphone 0.5 mg q4hprn Severe Pain -She is  medically stable for discharge at this time  General Anxiety Disorder -Apparently Cymbalta was stopped at SNF facility between recent admission and this admission.  -Confirmed by pharmacy team -Continue to Monitor closely   Metabolic Acidosis, improved -Had a slight metabolic acidosis with a CO2 of 20, anion gap of 8, chloride level 106; now she has a CO2 of 22, anion gap of 8, chloride level of 106 -Continue to Monitor and Trend and Repeat CMP within 1 week  Hypomagnesemia -Patient's Mag Level Trend: Recent Labs  Lab 04/12/23 0522 04/12/23 1625 04/13/23 0510 04/13/23 1717 04/14/23 0547 04/15/23 0457 04/16/23 0716  MG 1.6* 2.7* 2.2 2.1 1.8 1.8 1.6*  -Replete with IV Mag Sulfate 2 grams yesterday  -Continue to Monitor and Replete as Necessary -Repeat Mag in the AM   Normocytic Anemia -Hemoglobin around baseline of 9. -C/w Ferrous Sulfate 325 mg po Daily  -Hgb/Hct Trend: Recent Labs  Lab 04/10/23 2018 04/11/23 0615 04/12/23 0522 04/13/23 0510 04/14/23 0542 04/15/23 0457 04/16/23 0716  HGB 9.1* 9.1* 7.5* 8.4* 7.6* 8.1* 8.8*  HCT 28.2* 27.8* 24.9* 26.2* 24.4* 26.1* 29.5*  MCV 92.8 93.0 95.8 94.9 95.3 96.7 98.3  -Checked Anemia Panel and it showed an iron level of 45, UIBC of 134, TIBC 179, saturation ratios of 25%, ferritin level 206, folate of 10.8, vitamin B12 151 -Continue to Monitor for S/Sx of Bleeding; No overt bleeding noted -Repeat CBC within 1 week    Gout -Not taking Colchicine 1.2 mg po Daily anymore  GERD/Gastritis/GI Prophylaxis -Continue PPI with Pantoprazole 40 mg po Daily and Sucralfate 1 gram po TIDwm and  qHS  Hypoalbuminemia -Patient's Albumin Trend: Recent Labs  Lab 03/21/23 0715 03/23/23 0335 03/26/23 0338 03/27/23 0242 04/10/23 2018 04/11/23 0615 04/16/23 0716  ALBUMIN 2.7* 2.4* 1.9* 1.7* 1.8* 2.0* 2.0*  -Continue to Monitor and Trend and repeat CMP in the AM  Nutrition Status: Nutrition Problem: Altered nutrition lab  value Etiology: 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   Weight as of this encounter: 99.1 kg.  -Weight Loss and Dietary Counseling given

## 2023-04-11 NOTE — Progress Notes (Signed)
PHARMACY - ANTICOAGULATION CONSULT NOTE  Pharmacy Consult for heparin  Indication: acute DVT  Allergies  Allergen Reactions   Prednisone Swelling    Legs swell    Tape Hives   Sulfa Antibiotics Rash    Patient Measurements: Height: 5\' 1"  (154.9 cm) Weight: 105.9 kg (233 lb 7.5 oz) IBW/kg (Calculated) : 47.8 Heparin Dosing Weight: 74 kg  Vital Signs: Temp: 98 F (36.7 C) (12/16 0630) Temp Source: Oral (12/16 0630) BP: 139/67 (12/16 0900) Pulse Rate: 97 (12/16 0900)  Labs: Recent Labs    04/10/23 2018 04/11/23 0615 04/11/23 0917  HGB 9.1* 9.1*  --   HCT 28.2* 27.8*  --   PLT 613* 581*  --   HEPARINUNFRC  --   --  0.23*  CREATININE 0.53 0.61  --     Estimated Creatinine Clearance: 73.3 mL/min (by C-G formula based on SCr of 0.61 mg/dL).   Medical History: Past Medical History:  Diagnosis Date   Allergy    dust, pollen, sulfa, prednisone   Anemia    Anxiety    Arthritis    "knees" (04/26/2016)   Colon polyps    benign per pt   Coronary artery disease    mild per 2015 cath in [place] (OM1 30%, RCA 30%)   GERD (gastroesophageal reflux disease)    Gout    History of hiatal hernia    Hyperlipidemia 05/20/2021   Hypertension    Migraine    "none since early /2017" (05/06/2016)   Obesity    OSA on CPAP    uses CPAP   Pre-diabetes    Pre-operative cardiovascular examination 08/22/2008   Renal insufficiency 11/09/2022   Vasculitis (HCC)    Bilateral     Assessment: Patient is a 72 y.o F with G-tube who was recently hospitalized from 03/21/23 to 04/06/23 for perforated marginal ulcer. She underwent exp lap abdominal washout, Graham patch repair of perforated gastrojejunal ulcer, and intraperitoneal drain placement on 03/21/23. She presented back to the ED on 04/10/23 with c/o abdominal pain and nausea. Abdominal CT on 04/10/23 showed dislodged G-tube and findings with concern for DVT in the left common and superficial femoral arteries.  LE doppler on 04/11/23  resulted back with acute DVT in the left common femoral vein.   She is currently on heparin drip for acute VTE.  Today, 04/11/2023:  - first heparin level is sub-therapeutic at 0.23 - cbc stable - no bleeding documented    Goal of Therapy:  Heparin level 0.3-0.7 units/ml Monitor platelets by anticoagulation protocol: Yes   Plan:  - Increase heparin drip to 1350 units/hr - check 8 hr heparin level  - monitor for s/sx bleeding   Christine [name] P 04/11/2023,10:12 AM

## 2023-04-11 NOTE — Progress Notes (Signed)
Bilateral lower extremity venous duplex has been completed. Preliminary results can be found in CV Proc through chart review.  Results were given to Dr. Nelson Chimes.  04/11/23 8:41 AM Olen Cordial RVT

## 2023-04-11 NOTE — ED Notes (Signed)
..ED TO INPATIENT HANDOFF REPORT  Name/Age/Gender Christine [name] 72 y.o. female  Code Status    Code Status Orders  (From admission, onward)           Start     Ordered   04/11/23 0037  Do not attempt resuscitation (DNR)- Limited -Do Not Intubate (DNI)  (Code Status)  Continuous       Question Answer Comment  If pulseless and not breathing No CPR or chest compressions.   In Pre-Arrest Conditions (Patient Is Breathing and Has A Pulse) Do not intubate. Provide all appropriate non-invasive medical interventions. Avoid ICU transfer unless indicated or required.   Consent: Discussion documented in EHR or advanced directives reviewed      04/11/23 0037           Code Status History     Date Active Date Inactive Code Status Order ID Comments User Context   04/11/2023 0036 04/11/2023 0037 Full Code [id number]  Gery Pray, MD ED   03/21/2023 1317 04/06/2023 2016 Limited: Do not attempt resuscitation (DNR) -DNR-LIMITED -Do Not Intubate/DNI  [id number]  Maryln Gottron, MD ED   01/23/2023 1347 02/04/2023 2331 Limited: Do not attempt resuscitation (DNR) -DNR-LIMITED -Do Not Intubate/DNI  [id number]  Ernie Avena, NP Inpatient   01/08/2023 1456 01/23/2023 1346 Full Code [id number]  Cleora Fleet, MD ED   11/09/2022 0923 11/15/2022 2347 Full Code [id number]  Clydie Braun, MD ED   09/14/2022 1648 09/16/2022 1653 Full Code [id number]  Stechschulte, Hyman Hopes, MD Inpatient   05/06/2016 1334 05/07/2016 1519 Full Code [id number]  Manus Rudd, MD Inpatient   05/06/2016 1333 05/06/2016 1334 Full Code [id number]  Kinsinger, De Blanch, MD Inpatient      Advance Directive Documentation    Flowsheet Row Most Recent Value  Type of Advance Directive Out of facility DNR (pink MOST or yellow form)  Pre-existing out of facility DNR order (yellow form or pink MOST form) --  "MOST" Form in Place? --       Home/SNF/Other Nursing Home  Chief Complaint DVT (deep venous  thrombosis) (HCC) [I82.409]  Level of Care/Admitting Diagnosis ED Disposition     ED Disposition  Admit   Condition  --   Comment  Hospital Area: [facility] [[id number]]  Level of Care: Telemetry [5]  Admit to tele based on following criteria: Complex arrhythmia (Bradycardia/Tachycardia)  May admit patient to [name] or [name] if equivalent level of care is available:: Yes  Covid Evaluation: Asymptomatic - no recent exposure (last 10 days) testing not required  Diagnosis: DVT (deep venous thrombosis) (HCC) [616073]  Admitting Physician: Gery Pray [[id number]]  Attending Physician: Gery Pray [[id number]]  Certification:: I certify this patient will need inpatient services for at least 2 midnights  Expected Medical Readiness: 04/13/2023          Medical History Past Medical History:  Diagnosis Date   Allergy    dust, pollen, sulfa, prednisone   Anemia    Anxiety    Arthritis    "knees" (04/26/2016)   Colon polyps    benign per pt   Coronary artery disease    mild per 2015 cath in [place] (OM1 30%, RCA 30%)   GERD (gastroesophageal reflux disease)    Gout    History of hiatal hernia    Hyperlipidemia 05/20/2021   Hypertension    Migraine    "none since early /2017" (05/06/2016)   Obesity    OSA on CPAP  uses CPAP   Pre-diabetes    Pre-operative cardiovascular examination 08/22/2008   Renal insufficiency 11/09/2022   Vasculitis (HCC)    Bilateral    Allergies Allergies  Allergen Reactions   Prednisone Swelling    Legs swell    Tape Hives   Sulfa Antibiotics Rash    IV Location/Drains/Wounds Patient Lines/Drains/Airways Status     Active Line/Drains/Airways     Name Placement date Placement time Site Days   Peripheral IV 04/10/23 20 G 1" Left Antecubital 04/10/23  2017  Antecubital  1   Peripheral IV 04/11/23 20 G Right Antecubital 04/11/23  1601  Antecubital  less than 1   Gastrostomy/Enterostomy Gastrostomy 18 Fr. LUQ 04/11/23   1827  LUQ  less than 1   Incision - 3 Ports Umbilicus Mid;Upper Mid;Lateral --  --  -- --   Wound / Incision (Open or Dehisced) 03/21/23 Skin tear;Other (Comment) Sacrum Medial Shearing 1.5x0.5 03/21/23  2017  Sacrum  21            Labs/Imaging Results for orders placed or performed during the hospital encounter of 04/10/23 (from the past 48 hours)  CBC with Differential/Platelet     Status: Abnormal   Collection Time: 04/10/23  8:18 PM  Result Value Ref Range   WBC 4.0 4.0 - 10.5 K/uL   RBC 3.04 (L) 3.87 - 5.11 MIL/uL   Hemoglobin 9.1 (L) 12.0 - 15.0 g/dL   HCT 09.3 (L) 23.5 - 57.3 %   MCV 92.8 80.0 - 100.0 fL   MCH 29.9 26.0 - 34.0 pg   MCHC 32.3 30.0 - 36.0 g/dL   RDW 22.0 (H) 25.4 - 27.0 %   Platelets 613 (H) 150 - 400 K/uL   nRBC 0.0 0.0 - 0.2 %   Neutrophils Relative % 50 %   Neutro Abs 2.1 1.7 - 7.7 K/uL   Lymphocytes Relative 35 %   Lymphs Abs 1.4 0.7 - 4.0 K/uL   Monocytes Relative 12 %   Monocytes Absolute 0.5 0.1 - 1.0 K/uL   Eosinophils Relative 1 %   Eosinophils Absolute 0.0 0.0 - 0.5 K/uL   Basophils Relative 1 %   Basophils Absolute 0.0 0.0 - 0.1 K/uL   Immature Granulocytes 1 %   Abs Immature Granulocytes 0.03 0.00 - 0.07 K/uL    Comment: Performed at [facility], [address], [place], [place] [address]  Comprehensive metabolic panel     Status: Abnormal   Collection Time: 04/10/23  8:18 PM  Result Value Ref Range   Sodium 133 (L) 135 - 145 mmol/L   Potassium 3.6 3.5 - 5.1 mmol/L   Chloride 103 98 - 111 mmol/L   CO2 23 22 - 32 mmol/L   Glucose, Bld 105 (H) 70 - 99 mg/dL    Comment: Glucose reference range applies only to samples taken after fasting for at least 8 hours.   BUN 9 8 - 23 mg/dL   Creatinine, Ser 2.83 0.44 - 1.00 mg/dL   Calcium 8.3 (L) 8.9 - 10.3 mg/dL   Total Protein 6.3 (L) 6.5 - 8.1 g/dL   Albumin 1.8 (L) 3.5 - 5.0 g/dL   AST 46 (H) 15 - 41 U/L   ALT 29 0 - 44 U/L   Alkaline Phosphatase 181 (H) 38 - 126 U/L    Total Bilirubin 0.3 <1.2 mg/dL   GFR, Estimated >15 >17 mL/min    Comment: (NOTE) Calculated using the CKD-EPI Creatinine Equation (2021)  Anion gap 7 5 - 15    Comment: Performed at [facility], [address], [place], [place] [address]  Lipase, blood     Status: None   Collection Time: 04/10/23  8:18 PM  Result Value Ref Range   Lipase 28 11 - 51 U/L    Comment: Performed at [facility], [address], [place], [place] [address]  Comprehensive metabolic panel     Status: Abnormal   Collection Time: 04/11/23  6:15 AM  Result Value Ref Range   Sodium 133 (L) 135 - 145 mmol/L   Potassium 3.9 3.5 - 5.1 mmol/L   Chloride 103 98 - 111 mmol/L   CO2 24 22 - 32 mmol/L   Glucose, Bld 80 70 - 99 mg/dL    Comment: Glucose reference range applies only to samples taken after fasting for at least 8 hours.   BUN 10 8 - 23 mg/dL   Creatinine, Ser 5.78 0.44 - 1.00 mg/dL   Calcium 8.3 (L) 8.9 - 10.3 mg/dL   Total Protein 6.6 6.5 - 8.1 g/dL   Albumin 2.0 (L) 3.5 - 5.0 g/dL   AST 50 (H) 15 - 41 U/L   ALT 28 0 - 44 U/L   Alkaline Phosphatase 169 (H) 38 - 126 U/L   Total Bilirubin 0.6 <1.2 mg/dL   GFR, Estimated >46 >96 mL/min    Comment: (NOTE) Calculated using the CKD-EPI Creatinine Equation (2021)    Anion gap 6 5 - 15    Comment: Performed at [facility], [address] [place], [place] [address]  CBC with Differential/Platelet     Status: Abnormal   Collection Time: 04/11/23  6:15 AM  Result Value Ref Range   WBC 4.6 4.0 - 10.5 K/uL   RBC 2.99 (L) 3.87 - 5.11 MIL/uL   Hemoglobin 9.1 (L) 12.0 - 15.0 g/dL   HCT 41.3 (L) 24.4 - 01.0 %   MCV 93.0 80.0 - 100.0 fL   MCH 30.4 26.0 - 34.0 pg   MCHC 32.7 30.0 - 36.0 g/dL   RDW 27.2 (H) 53.6 - 64.4 %   Platelets 581 (H) 150 - 400 K/uL   nRBC 0.0 0.0 - 0.2 %   Neutrophils Relative % 40 %   Neutro Abs 1.9 1.7 - 7.7 K/uL   Lymphocytes Relative 47 %   Lymphs Abs 2.2 0.7 - 4.0  K/uL   Monocytes Relative 10 %   Monocytes Absolute 0.4 0.1 - 1.0 K/uL   Eosinophils Relative 1 %   Eosinophils Absolute 0.1 0.0 - 0.5 K/uL   Basophils Relative 1 %   Basophils Absolute 0.1 0.0 - 0.1 K/uL   Immature Granulocytes 1 %   Abs Immature Granulocytes 0.03 0.00 - 0.07 K/uL    Comment: Performed at [facility], [address], [place], [place] [address]  Heparin level (unfractionated)     Status: Abnormal   Collection Time: 04/11/23  9:17 AM  Result Value Ref Range   Heparin Unfractionated 0.23 (L) 0.30 - 0.70 IU/mL    Comment: (NOTE) The clinical reportable range upper limit is being lowered to >1.10 to align with the FDA approved guidance for the current laboratory assay.  If heparin results are below expected values, and patient dosage has  been confirmed, suggest follow up testing of antithrombin III levels. Performed at [facility], [address], [place], [place] [address]   TSH     Status:  None   Collection Time: 04/11/23 11:08 AM  Result Value Ref Range   TSH 2.776 0.350 - 4.500 uIU/mL    Comment: Performed by a 3rd Generation assay with a functional sensitivity of <=0.01 uIU/mL. Performed at [facility], [address], [place], [place] [address]    VAS [place] LOWER EXTREMITY VENOUS (DVT) Result Date: 04/11/2023  Lower Venous DVT Study Patient Name:  DAVE ARTERS  Date of Exam:   04/11/2023 Medical Rec #: [id number]              Accession #:    [id number] Date of Birth: August 08, 1950             Patient Gender: F Patient Age:   24 years Exam Location:  [facility] Procedure:      VAS [place] LOWER EXTREMITY VENOUS (DVT) Referring Phys: DEBBY CROSLEY --------------------------------------------------------------------------------  Indications: Swelling.  Anticoagulation: Heparin. Limitations: Poor ultrasound/tissue interface and body habitus. Comparison Study: 04/10/2023 - CT ABDOMEN PELVIS W CONTRAST                     IMPRESSION:                   1. Percutaneous gastrostomy tube with balloon in the                   subcutaneous                   fat of the left ventral abdomen. The tip is within the                   anterior                   abdominal wall and not in the stomach.                   2. Filling defect in the left common and superficial femoral                   arteries concerning for acute DVT. Ultrasound is recommended                   for                   confirmation.                   3. Slightly decreased gastritis. Decreased surrounding fluid                   and                   stranding. The previous pneumoperitoneum has resolved.                   4. Marked hepatic steatosis.                   5. Trace left pleural effusion.                   6. Postoperative change about the midline anterior abdomen                   with                   small hematomas or seromas. Performing Technologist: Chanda Busing RVT  Examination Guidelines: A complete evaluation includes B-mode imaging, spectral  Doppler, color Doppler, and power Doppler as needed of all accessible portions of each vessel. Bilateral testing is considered an integral part of a complete examination. Limited examinations for reoccurring indications may be performed as noted. The reflux portion of the exam is performed with the patient in reverse Trendelenburg.  +---------+---------------+---------+-----------+----------+-------------------+ RIGHT    CompressibilityPhasicitySpontaneityPropertiesThrombus Aging      +---------+---------------+---------+-----------+----------+-------------------+ CFV      Full           Yes      Yes                                      +---------+---------------+---------+-----------+----------+-------------------+ SFJ      Full                                                             +---------+---------------+---------+-----------+----------+-------------------+ FV  Prox  Full                                                             +---------+---------------+---------+-----------+----------+-------------------+ FV Mid   Full                                                             +---------+---------------+---------+-----------+----------+-------------------+ FV DistalFull                                                             +---------+---------------+---------+-----------+----------+-------------------+ PFV      Full                                                             +---------+---------------+---------+-----------+----------+-------------------+ POP      Full           Yes      Yes                                      +---------+---------------+---------+-----------+----------+-------------------+ PTV      Full                                                             +---------+---------------+---------+-----------+----------+-------------------+ PERO  Not well visualized +---------+---------------+---------+-----------+----------+-------------------+   +---------+---------------+---------+-----------+----------+--------------+ LEFT     CompressibilityPhasicitySpontaneityPropertiesThrombus Aging +---------+---------------+---------+-----------+----------+--------------+ CFV      Partial        No       No                   Acute          +---------+---------------+---------+-----------+----------+--------------+ SFJ      Full                                                        +---------+---------------+---------+-----------+----------+--------------+ FV Prox  Full                                                        +---------+---------------+---------+-----------+----------+--------------+ FV Mid   Full                                                         +---------+---------------+---------+-----------+----------+--------------+ FV DistalFull                                                        +---------+---------------+---------+-----------+----------+--------------+ PFV      Full                                                        +---------+---------------+---------+-----------+----------+--------------+ POP      Full           Yes      Yes                                 +---------+---------------+---------+-----------+----------+--------------+ PTV      Full                                                        +---------+---------------+---------+-----------+----------+--------------+ PERO     Full                                                        +---------+---------------+---------+-----------+----------+--------------+ EIV                     Yes      Yes                                 +---------+---------------+---------+-----------+----------+--------------+  Summary: RIGHT: - There is no evidence of deep vein thrombosis in the lower extremity. However, portions of this examination were limited- see technologist comments above.  - No cystic structure found in the popliteal fossa.  LEFT: - Findings consistent with acute deep vein thrombosis involving the left common femoral vein.  - No cystic structure found in the popliteal fossa.  *See table(s) above for measurements and observations. Electronically signed by Gerarda Fraction on 04/11/2023 at 6:44:42 PM.    Final    CT ABDOMEN PELVIS W CONTRAST Result Date: 04/10/2023 CLINICAL DATA:  Abdominal pain with nausea. EXAM: CT ABDOMEN AND PELVIS WITH CONTRAST TECHNIQUE: Multidetector CT imaging of the abdomen and pelvis was performed using the standard protocol following bolus administration of intravenous contrast. RADIATION DOSE REDUCTION: This exam was performed according to the departmental dose-optimization program which includes automated exposure  control, adjustment of the mA and/or kV according to patient size and/or use of iterative reconstruction technique. CONTRAST:  OMNIPAQUE IOHEXOL 300 MG/ML  SOLN COMPARISON:  CT abdomen and pelvis 03/21/2023 FINDINGS: Lower chest: Trace left pleural effusion. Hepatobiliary: Marked hepatic steatosis. Cholecystectomy. No biliary dilation. Pancreas: Unremarkable. Spleen: Unremarkable. Adrenals/Urinary Tract: Stable adrenal glands and kidneys. No urinary calculi or hydronephrosis. Unremarkable bladder. Stomach/Bowel: Postoperative change of Roux-en-Y gastric bypass. There is a percutaneous gastrostomy tube with balloon in the subcutaneous fat of the left ventral abdomen. The tip is within the anterior abdominal wall and not in the stomach. Wall thickening and mucosal hyperenhancement about the stomach has slightly decreased from 03/21/2023. Decreased surrounding fluid and stranding. Normal caliber large and small bowel. Vascular/Lymphatic: Filling defects in the left common femoral and superficial femoral veins (circa series 2/image 81) appears similar to 03/21/2023. Mixing artifact can have this appearance however given similarity to 03/21/2023 this is concerning for DVT. Aortic atherosclerosis. No enlarged abdominal or pelvic lymph nodes. Reproductive: Hysterectomy.  No adnexal mass. Other: The previous pneumoperitoneum has resolved. Stranding along the midline abdominal wall incision compatible with postoperative change. Small fluid collections measuring up to 3.1 cm likely postoperative hematomas or seromas. Musculoskeletal: No acute fracture. IMPRESSION: 1. Percutaneous gastrostomy tube with balloon in the subcutaneous fat of the left ventral abdomen. The tip is within the anterior abdominal wall and not in the stomach. 2. Filling defect in the left common and superficial femoral arteries concerning for acute DVT. Ultrasound is recommended for confirmation. 3. Slightly decreased gastritis. Decreased  surrounding fluid and stranding. The previous pneumoperitoneum has resolved. 4. Marked hepatic steatosis. 5. Trace left pleural effusion. 6. Postoperative change about the midline anterior abdomen with small hematomas or seromas. Aortic Atherosclerosis (ICD10-I70.0). These results were called by telephone at the time of interpretation on 04/10/2023 at 10:56 pm to provider [facility] , who verbally acknowledged these results. Electronically Signed   By: Minerva Fester M.D.   On: 04/10/2023 23:08    Pending Labs Unresulted Labs (From admission, onward)     Start     Ordered   04/12/23 0500  Basic metabolic panel  Daily,   R      04/11/23 1010   04/12/23 0500  Magnesium  Daily,   R      04/11/23 1010   04/12/23 0500  Phosphorus  Tomorrow morning,   R        04/11/23 1010   04/12/23 0330  Heparin level (unfractionated)  Once-Timed,   TIMED        04/11/23 1851   04/11/23 0500  CBC  Daily,  R      04/10/23 2353            Vitals/Pain Today's Vitals   04/11/23 1730 04/11/23 1920 04/11/23 1930 04/11/23 2100  BP: 129/84 (!) 146/82 138/83 112/86  Pulse: 89 94 83 93  Resp: 16 16 16 20   Temp: 98 F (36.7 C)   98.3 F (36.8 C)  TempSrc:      SpO2:  100% 100% 100%  Weight:      Height:      PainSc:        Isolation Precautions No active isolations  Medications Medications  heparin ADULT infusion 100 units/mL (25000 units/252mL) (1,350 Units/hr Intravenous New Bag/Given 04/11/23 1918)  HYDROmorphone (DILAUDID) injection 0.5 mg (has no administration in time range)  oxyCODONE (Oxy IR/ROXICODONE) immediate release tablet 5 mg (has no administration in time range)  acetaminophen (TYLENOL) tablet 650 mg (has no administration in time range)    Or  acetaminophen (TYLENOL) suppository 650 mg (has no administration in time range)  ondansetron (ZOFRAN) tablet 4 mg (has no administration in time range)  dextrose 5 % and 0.45 % NaCl infusion (0 mLs Intravenous Stopped 04/11/23  1745)  ipratropium-albuterol (DUONEB) 0.5-2.5 (3) MG/3ML nebulizer solution 3 mL (has no administration in time range)  ondansetron (ZOFRAN) injection 4 mg (has no administration in time range)  senna-docusate (Senokot-S) tablet 1 tablet (has no administration in time range)  guaiFENesin (ROBITUSSIN) 100 MG/5ML liquid 5 mL (has no administration in time range)  hydrALAZINE (APRESOLINE) injection 10 mg (has no administration in time range)  metoprolol tartrate (LOPRESSOR) injection 5 mg (has no administration in time range)  ondansetron (ZOFRAN) injection 4 mg (4 mg Intravenous Given 04/10/23 2018)  morphine (PF) 4 MG/ML injection 4 mg (4 mg Intravenous Given 04/10/23 2018)  pantoprazole (PROTONIX) injection 40 mg (40 mg Intravenous Given 04/10/23 2018)  lactated ringers bolus 1,000 mL (0 mLs Intravenous Stopped 04/11/23 0007)  iohexol (OMNIPAQUE) 300 MG/ML solution 100 mL (100 mLs Intravenous Contrast Given 04/10/23 2225)  heparin bolus via infusion 4,000 Units (4,000 Units Intravenous Bolus from Bag 04/11/23 0039)  iohexol (OMNIPAQUE) 300 MG/ML solution 50 mL (15 mLs Per Tube Contrast Given 04/11/23 1828)    Mobility non-ambulatory

## 2023-04-11 NOTE — ED Notes (Signed)
Dextrose 5% and 0.45% stopped so pt could be taken to surgery.

## 2023-04-12 ENCOUNTER — Inpatient Hospital Stay (HOSPITAL_COMMUNITY): Payer: Medicare HMO

## 2023-04-12 ENCOUNTER — Encounter (HOSPITAL_COMMUNITY): Payer: Self-pay | Admitting: Family Medicine

## 2023-04-12 ENCOUNTER — Telehealth (HOSPITAL_COMMUNITY): Payer: Self-pay | Admitting: Pharmacy Technician

## 2023-04-12 ENCOUNTER — Other Ambulatory Visit (HOSPITAL_COMMUNITY): Payer: Self-pay

## 2023-04-12 DIAGNOSIS — I82401 Acute embolism and thrombosis of unspecified deep veins of right lower extremity: Secondary | ICD-10-CM | POA: Diagnosis not present

## 2023-04-12 LAB — CBC
HCT: 24.9 % — ABNORMAL LOW (ref 36.0–46.0)
Hemoglobin: 7.5 g/dL — ABNORMAL LOW (ref 12.0–15.0)
MCH: 28.8 pg (ref 26.0–34.0)
MCHC: 30.1 g/dL (ref 30.0–36.0)
MCV: 95.8 fL (ref 80.0–100.0)
Platelets: 499 10*3/uL — ABNORMAL HIGH (ref 150–400)
RBC: 2.6 MIL/uL — ABNORMAL LOW (ref 3.87–5.11)
RDW: 17.7 % — ABNORMAL HIGH (ref 11.5–15.5)
WBC: 3.4 10*3/uL — ABNORMAL LOW (ref 4.0–10.5)
nRBC: 0 % (ref 0.0–0.2)

## 2023-04-12 LAB — BASIC METABOLIC PANEL
Anion gap: 8 (ref 5–15)
BUN: 14 mg/dL (ref 8–23)
CO2: 23 mmol/L (ref 22–32)
Calcium: 8.1 mg/dL — ABNORMAL LOW (ref 8.9–10.3)
Chloride: 103 mmol/L (ref 98–111)
Creatinine, Ser: 0.65 mg/dL (ref 0.44–1.00)
GFR, Estimated: >60 mL/min (ref >60)
Glucose, Bld: 195 mg/dL — ABNORMAL HIGH (ref 70–99)
Potassium: 3.5 mmol/L (ref 3.5–5.1)
Sodium: 134 mmol/L — ABNORMAL LOW (ref 135–145)

## 2023-04-12 LAB — GLUCOSE, CAPILLARY
Glucose-Capillary: 42 mg/dL — CL (ref 70–99)
Glucose-Capillary: 54 mg/dL — ABNORMAL LOW (ref 70–99)
Glucose-Capillary: 55 mg/dL — ABNORMAL LOW (ref 70–99)
Glucose-Capillary: 77 mg/dL (ref 70–99)
Glucose-Capillary: 89 mg/dL (ref 70–99)
Glucose-Capillary: 90 mg/dL (ref 70–99)
Glucose-Capillary: 90 mg/dL (ref 70–99)

## 2023-04-12 LAB — MAGNESIUM
Magnesium: 1.6 mg/dL — ABNORMAL LOW (ref 1.7–2.4)
Magnesium: 2.7 mg/dL — ABNORMAL HIGH (ref 1.7–2.4)

## 2023-04-12 LAB — HEPARIN LEVEL (UNFRACTIONATED): Heparin Unfractionated: 0.28 [IU]/mL — ABNORMAL LOW (ref 0.30–0.70)

## 2023-04-12 LAB — PHOSPHORUS
Phosphorus: 3.9 mg/dL (ref 2.5–4.6)
Phosphorus: 3.9 mg/dL (ref 2.5–4.6)

## 2023-04-12 MED ORDER — DEXTROSE 50 % IV SOLN
25.0000 g | INTRAVENOUS | Status: AC
  Administered 2023-04-12: 25 g via INTRAVENOUS
  Filled 2023-04-12: qty 50

## 2023-04-12 MED ORDER — VITAMIN D 25 MCG (1000 UNIT) PO TABS
5000.0000 [IU] | ORAL_TABLET | Freq: Every day | ORAL | Status: DC
  Administered 2023-04-12 – 2023-04-14 (×3): 5000 [IU] via ORAL
  Filled 2023-04-12 (×4): qty 5

## 2023-04-12 MED ORDER — MAGNESIUM SULFATE 4 GM/100ML IV SOLN
4.0000 g | Freq: Once | INTRAVENOUS | Status: AC
  Administered 2023-04-12: 4 g via INTRAVENOUS
  Filled 2023-04-12: qty 100

## 2023-04-12 MED ORDER — PROSOURCE TF20 ENFIT COMPATIBL EN LIQD
60.0000 mL | Freq: Every day | ENTERAL | Status: DC
  Administered 2023-04-12 – 2023-04-21 (×10): 60 mL
  Filled 2023-04-12 (×10): qty 60

## 2023-04-12 MED ORDER — HEPARIN BOLUS VIA INFUSION
1000.0000 [IU] | INTRAVENOUS | Status: AC
  Administered 2023-04-12: 1000 [IU] via INTRAVENOUS
  Filled 2023-04-12: qty 1000

## 2023-04-12 MED ORDER — APIXABAN 5 MG PO TABS
10.0000 mg | ORAL_TABLET | Freq: Two times a day (BID) | ORAL | Status: DC
  Administered 2023-04-12 – 2023-04-14 (×6): 10 mg via ORAL
  Filled 2023-04-12 (×7): qty 2

## 2023-04-12 MED ORDER — VITAMIN C 500 MG PO TABS
500.0000 mg | ORAL_TABLET | Freq: Every day | ORAL | Status: DC
  Administered 2023-04-12 – 2023-04-14 (×3): 500 mg via ORAL
  Filled 2023-04-12 (×4): qty 1

## 2023-04-12 MED ORDER — JEVITY 1.2 CAL PO LIQD
1000.0000 mL | ORAL | Status: DC
  Administered 2023-04-12 – 2023-04-20 (×8): 1000 mL
  Filled 2023-04-12 (×10): qty 1000

## 2023-04-12 MED ORDER — ADULT MULTIVITAMIN W/MINERALS CH
1.0000 | ORAL_TABLET | Freq: Every day | ORAL | Status: DC
Start: 2023-04-12 — End: 2023-04-15
  Administered 2023-04-12 – 2023-04-14 (×3): 1 via ORAL
  Filled 2023-04-12 (×4): qty 1

## 2023-04-12 MED ORDER — SUCRALFATE 1 GM/10ML PO SUSP
1.0000 g | Freq: Three times a day (TID) | ORAL | Status: DC
  Administered 2023-04-12 – 2023-04-15 (×10): 1 g via ORAL
  Filled 2023-04-12 (×10): qty 10

## 2023-04-12 MED ORDER — SODIUM CHLORIDE (PF) 0.9 % IJ SOLN
INTRAMUSCULAR | Status: AC
  Filled 2023-04-12: qty 50

## 2023-04-12 MED ORDER — APIXABAN 5 MG PO TABS
5.0000 mg | ORAL_TABLET | Freq: Two times a day (BID) | ORAL | Status: DC
Start: 2023-04-19 — End: 2023-04-15

## 2023-04-12 MED ORDER — PANTOPRAZOLE SODIUM 40 MG PO TBEC
40.0000 mg | DELAYED_RELEASE_TABLET | Freq: Every day | ORAL | Status: DC
  Administered 2023-04-12 – 2023-04-14 (×3): 40 mg via ORAL
  Filled 2023-04-12 (×4): qty 1

## 2023-04-12 MED ORDER — FERROUS SULFATE 325 (65 FE) MG PO TABS
325.0000 mg | ORAL_TABLET | Freq: Every day | ORAL | Status: DC
Start: 2023-04-12 — End: 2023-04-15
  Administered 2023-04-12 – 2023-04-15 (×4): 325 mg via ORAL
  Filled 2023-04-12 (×4): qty 1

## 2023-04-12 MED ORDER — COPPER 2 MG PO TABS
2.0000 mg | Freq: Every day | Status: DC
  Administered 2023-04-14: 2 mg via ORAL
  Filled 2023-04-12 (×5): qty 1

## 2023-04-12 NOTE — Progress Notes (Signed)
Hypoglycemic Event  CBG: 42  Treatment: D50 50 mL (25 gm)  Symptoms: Hungry  Follow-up CBG: Time:0536 CBG Result:77  Possible Reasons for Event: Inadequate meal intake   Christine [name] C Amadeus Oyama

## 2023-04-12 NOTE — Evaluation (Signed)
Physical Therapy Evaluation Patient Details Name: Christine [name] MRN: [id number] DOB: 1950/12/08 Today's Date: 04/12/2023  History of Present Illness  72 year old  admitted with  abdominal pain located at the tube insertion site. CT abdomen pelvis shows percutaneous G-tube with balloon in the subcutaneous fat. Concerning of filling defect in the left superficial femoral vein concerning for DVT which is confirmed on the ultrasound. IR replaced G-tube on 12/16. PMH: HTN, HLD, OSA on CPAP, GERD, anxiety, morbid obesity, esophageal dysmotility with gastric bypass 2024, failure to thrive with G-tube placement, sacral wound, ESBL UTI recently admitted from 11/25 - 12/11 for perforated ulcer at gastrojejunal anastomosis with anterior side anastomosis repaired with [name] patch  Clinical Impression  Pt admitted with above diagnosis. Pt reports mostly bed to w/c transfers at SNF, strong hx of falls.  Pt agreeable to PT.  Able to transition supine<>sit and stand EOB, take lateral steps with min assist to CGA. Pt is hopeful to  go to a different facility at d/c Patient will benefit from continued inpatient follow up therapy, <3 hours/day   Pt currently with functional limitations due to the deficits listed below (see PT Problem List). Pt will benefit from acute skilled PT to increase their independence and safety with mobility to allow discharge.           If plan is discharge home, recommend the following: A lot of help with bathing/dressing/bathroom;Assistance with cooking/housework;Assist for transportation;Help with stairs or ramp for entrance;A lot of help with walking and/or transfers   Can travel by private vehicle   No    Equipment Recommendations None recommended by PT  Recommendations for Other Services       Functional Status Assessment Patient has had a recent decline in their functional status and demonstrates the ability to make significant improvements in function in a  reasonable and predictable amount of time.     Precautions / Restrictions Precautions Precautions: Fall Precaution Comments: Gtube (recently dislodged at [facility]  and reinserted by IR  this adm 12/16) Restrictions Weight Bearing Restrictions Per Provider Order: No      Mobility  Bed Mobility Overal bed mobility: Needs Assistance Bed Mobility: Supine to Sit, Sit to Supine Rolling: Contact guard assist   Supine to sit: Contact guard, HOB elevated, Used rails     General bed mobility comments: light Assist for bil LEs onto bed. Increased time. Reliance on bedrail and HOB elevated    Transfers Overall transfer level: Needs assistance Equipment used: Rolling walker (2 wheels) Transfers: Sit to/from Stand Sit to Stand: Min assist, Contact guard assist           General transfer comment: CGA to rise and transition to RW, uses momentum to stand; able to take lateral steps along EOB with RW and min assist    Ambulation/Gait               General Gait Details: pt politely declined amb or OOB to chair  Stairs            Wheelchair Mobility     Tilt Bed    Modified Rankin (Stroke Patients Only)       Balance Overall balance assessment: Needs assistance Sitting-balance support: Feet supported, No upper extremity supported Sitting balance-Leahy Scale: Good     Standing balance support: Bilateral upper extremity supported, Reliant on assistive device for balance Standing balance-Leahy Scale: Poor  Pertinent Vitals/Pain Pain Assessment Pain Assessment: No/denies pain    Home Living Family/patient expects to be discharged to:: Skilled nursing facility                        Prior Function Prior Level of Function : Needs assist;History of Falls (last six months)             Mobility Comments: hx of multiple falls       Extremity/Trunk Assessment   Upper Extremity Assessment Upper Extremity  Assessment: Overall WFL for tasks assessed    Lower Extremity Assessment Lower Extremity Assessment: Generalized weakness (lower leg skin changes)       Communication   Communication Communication: No apparent difficulties  Cognition Arousal: Alert Behavior During Therapy: WFL for tasks assessed/performed Overall Cognitive Status: Within Functional Limits for tasks assessed                                 General Comments: oriented x4, motivated        General Comments      Exercises     Assessment/Plan    PT Assessment Patient needs continued PT services  PT Problem List Decreased strength;Decreased activity tolerance;Decreased mobility;Decreased balance       PT Treatment Interventions DME instruction;Therapeutic activities;Gait training;Functional mobility training;Patient/family education;Therapeutic exercise    PT Goals (Current goals can be found in the Care Plan section)  Acute Rehab PT Goals Patient Stated Goal: get back home PT Goal Formulation: With patient Time For Goal Achievement: 04/26/23 Potential to Achieve Goals: Good    Frequency Min 1X/week     Co-evaluation               AM-PAC PT "6 Clicks" Mobility  Outcome Measure Help needed turning from your back to your side while in a flat bed without using bedrails?: A Little Help needed moving from lying on your back to sitting on the side of a flat bed without using bedrails?: A Little Help needed moving to and from a bed to a chair (including a wheelchair)?: A Lot Help needed standing up from a chair using your arms (e.g., wheelchair or bedside chair)?: A Little Help needed to walk in hospital room?: A Lot Help needed climbing 3-5 steps with a railing? : Total 6 Click Score: 14    End of Session   Activity Tolerance: Patient tolerated treatment well Patient left: in bed;with call bell/phone within reach;with bed alarm set   PT Visit Diagnosis: Muscle weakness  (generalized) (M62.81);Difficulty in walking, not elsewhere classified (R26.2);History of falling (Z91.81)    Time: 6578-4696 PT Time Calculation (min) (ACUTE ONLY): 14 min   Charges:   PT Evaluation $PT Eval Low Complexity: 1 Low   PT General Charges $$ ACUTE PT VISIT: 1 Visit         Germani Gavilanes, PT  Acute Rehab Dept [facility]) [phone]  04/12/2023   [facility] 04/12/2023, 4:22 PM

## 2023-04-12 NOTE — Progress Notes (Addendum)
PHARMACY - ANTICOAGULATION CONSULT NOTE  Pharmacy Consult for heparin  Indication: acute DVT  Allergies  Allergen Reactions   Prednisone Swelling    Legs swell    Tape Hives   Sulfa Antibiotics Rash    Patient Measurements: Height: 5\' 1"  (154.9 cm) Weight: 105.9 kg (233 lb 7.5 oz) IBW/kg (Calculated) : 47.8 Heparin Dosing Weight: 74 kg  Vital Signs: Temp: 97.9 F (36.6 C) (12/17 0510) Temp Source: Oral (12/17 0510) BP: 144/73 (12/17 0510) Pulse Rate: 93 (12/17 0510)  Labs: Recent Labs    04/10/23 2018 04/11/23 0615 04/11/23 0917 04/12/23 0522  HGB 9.1* 9.1*  --  7.5*  HCT 28.2* 27.8*  --  24.9*  PLT 613* 581*  --  499*  HEPARINUNFRC  --   --  0.23* 0.28*  CREATININE 0.53 0.61  --  0.65    Estimated Creatinine Clearance: 73.3 mL/min (by C-G formula based on SCr of 0.65 mg/dL).   Medical History: Past Medical History:  Diagnosis Date   Allergy    dust, pollen, sulfa, prednisone   Anemia    Anxiety    Arthritis    "knees" (04/26/2016)   Colon polyps    benign per pt   Coronary artery disease    mild per 2015 cath in [place] (OM1 30%, RCA 30%)   GERD (gastroesophageal reflux disease)    Gout    History of hiatal hernia    Hyperlipidemia 05/20/2021   Hypertension    Migraine    "none since early /2017" (05/06/2016)   Obesity    OSA on CPAP    uses CPAP   Pre-diabetes    Pre-operative cardiovascular examination 08/22/2008   Renal insufficiency 11/09/2022   Vasculitis (HCC)    Bilateral     Assessment: Patient is a 72 y.o F with G-tube who was recently hospitalized from 03/21/23 to 04/06/23 for perforated marginal ulcer. She underwent ex lap abdominal washout, Graham patch repair of perforated gastrojejunal ulcer, and intraperitoneal drain placement on 03/21/23. She presented back to the ED on 04/10/23 with c/o abdominal pain and nausea. Abdominal CT on 04/10/23 showed dislodged G-tube and findings with concern for DVT in the left common and  superficial femoral arteries. LE doppler on 04/11/23 resulted back with acute DVT in the left common femoral vein. She is currently on heparin drip for acute VTE.  Significant events: 12/16: Heparin off at 1735 for IR procedure. IR completed gastrostomy tube replacement under fluoro at 1833. Dr Fredia Sorrow ok'ed to resume heparin.  Today, 04/12/2023: AM heparin level = 0.28 units/mL, subtherapeutic CBC: Hgb down to 7.5, Plt elevated at 499K No infusion issues or pauses in therapy per nursing No bleeding issues noted per nursing CTa chest results pending  Goal of Therapy:  Heparin level 0.3-0.7 units/ml Monitor platelets by anticoagulation protocol: Yes   Plan:  Heparin 1000 units IV bolus x 1, then increase heparin infusion rate to 1500 units/hr Heparin level 8 hours after rate change Daily CBC, heparin level Monitor closely for s/sx of bleeding Follow up long-term anticoagulation plans   Greer Pickerel, PharmD, BCPS Clinical Pharmacist 04/12/2023 7:09 AM    Addendum:  Now asked to transition anticoagulation to PO Apixaban for DVT. 12/15 CT abd pelvis noted small fluid collections measuring up to 3.1 cm likely postoperative hematomas or seromas. - MD is aware, ok to proceed with anticoagulation with careful monitoring. 12/17 CTa chest negative for PE.   Plan: Discontinue IV heparin. At the same time, start Apixaban 10mg  PO  BID x 7 days, then 5mg  PO BID thereafter.  Monitor CBC and for s/sx of bleeding. Pharmacy to provide education prior to discharge.   Greer Pickerel, PharmD, BCPS Clinical Pharmacist 04/12/2023 11:57 AM

## 2023-04-12 NOTE — Progress Notes (Signed)
   04/12/23 2100  BiPAP/CPAP/SIPAP  BiPAP/CPAP/SIPAP Pt Type Adult  Reason BIPAP/CPAP not in use Non-compliant

## 2023-04-12 NOTE — Discharge Instructions (Signed)
Information on my medicine - ELIQUIS (apixaban)  Why was Eliquis prescribed for you? Eliquis was prescribed to treat blood clots that may have been found in the veins of your legs (deep vein thrombosis) or in your lungs (pulmonary embolism) and to reduce the risk of them occurring again.  What do You need to know about Eliquis ? The starting dose is 10 mg (two 5 mg tablets) taken TWICE daily for the FIRST SEVEN (7) DAYS, then on 04/19/23, the dose is reduced to ONE 5 mg tablet taken TWICE daily.  Eliquis may be taken with or without food.   Try to take the dose about the same time in the morning and in the evening. If you have difficulty swallowing the tablet whole please discuss with your pharmacist how to take the medication safely.  Take Eliquis exactly as prescribed and DO NOT stop taking Eliquis without talking to the doctor who prescribed the medication.  Stopping may increase your risk of developing a new blood clot.  Refill your prescription before you run out.  After discharge, you should have regular check-up appointments with your healthcare provider that is prescribing your Eliquis.    What do you do if you miss a dose? If a dose of ELIQUIS is not taken at the scheduled time, take it as soon as possible on the same day and twice-daily administration should be resumed. The dose should not be doubled to make up for a missed dose.  Important Safety Information A possible side effect of Eliquis is bleeding. You should call your healthcare provider right away if you experience any of the following: Bleeding from an injury or your nose that does not stop. Unusual colored urine (red or dark brown) or unusual colored stools (red or black). Unusual bruising for unknown reasons. A serious fall or if you hit your head (even if there is no bleeding).  Some medicines may interact with Eliquis and might increase your risk of bleeding or clotting while on Eliquis. To help avoid this,  consult your healthcare provider or pharmacist prior to using any new prescription or non-prescription medications, including herbals, vitamins, non-steroidal anti-inflammatory drugs (NSAIDs) and supplements.  This website has more information on Eliquis (apixaban): http://[email or web address].com/eliquis/home

## 2023-04-12 NOTE — Progress Notes (Addendum)
Initial Nutrition Assessment  DOCUMENTATION CODES:   Morbid obesity  INTERVENTION:  - Initiate tube feeding via new G-tube: Jevity 1.2 at 45 ml/h (1080 ml per day) *Start at 49mL/hr and advance by 10mL Q4H  Provides 1296 kcal, 60 gm protein, 871 ml free water daily  - 2 gram Sodium diet per MD.  - Add daily Multivitamin with minerals, 5000 units vitamin D, 2mg  copper (for deficiency). Continue 500mg  vitamin C.   - Monitor weight trends.    NUTRITION DIAGNOSIS:   Altered nutrition lab value related to chronic illness (gastric bypass with dumping syndrome) as evidenced by other (comment) (other (comment) hypoglycemia with need to start tube feeds).  GOAL:   Patient will meet greater than or equal to 90% of their needs  MONITOR:   PO intake, Labs, TF tolerance  REASON FOR ASSESSMENT:   Consult Enteral/tube feeding initiation and management  ASSESSMENT:   72 y.o. with history of HTN, HLD, OSA on CPAP, GERD, anxiety, morbid obesity, esophageal dysmotility with gastric bypass (May 2024), failure to thrive with G-tube placement, sacral wound, ESBL UTI recently admitted from 11/25 - 12/11 for perforated ulcer at gastrojejunal anastomosis with anterior side anastomosis repaired with [name] patch and discharged to SNF. 3 days PTA developed more abdominal pain located at the tube insertion site. Admitted for dislodged gastrostomy tube.   12/15: Admit 12/16: IR replaced G-tube  Patient well known to this RD from recent hospitalization.  Weight stable since last admission. Prior to this, she lost from 289# to 230# after gastric bypass surgery in May 2024.  She reports getting tube feeds at her SNF. Shares they were continuous but she is unsure of what the formula to the rate was. Tolerated them fine until issues with abdominal pain started that caused her to present to hospital. Eating at SNF.   Patient had G-tube replaced yesterday, per IR was good to use yesterday. MD wanting to  restart tube feeds today. Will resume same tube feed regimen as last admission which will meet 75-85% of kcal and protein needs. Will plan to start at 61mL/hr and advance by 10mL Q4H for better tolerance. Discussed with MD and RN.   Patient started on a 2g sodium diet today. Would like to hold off on any supplements as she grew somewhat tired of them last admission.   Patient reports the SNF she was at lost her med list so she has not been getting micronutrient supplementation. Agreeable to restart during admission. She notes it is often a lot of pills. Discussed that once discharged, can restart bariatric MVI twice daily to get all micronutrient needs met. Discussed adding back supplementation with MD.     Medications reviewed and include: 500mg  vitamin C, 325mg  ferrous sulfate  Labs reviewed:  Na 134 Magnesium 1.6 Ha1C 6.3 Blood Glucose 42-77 x24 hours12/17   NUTRITION - FOCUSED PHYSICAL EXAM:  No depletions  Diet Order:   Diet Order             Diet 2 gram sodium Room service appropriate? Yes; Fluid consistency: Thin  Diet effective now                   EDUCATION NEEDS:  Education needs have been addressed  Skin:  Skin Assessment: Skin Integrity Issues: Skin Integrity Issues:: Other (Comment) Other: Irritant Dermatitis (Moisture Associated Skin Damage) - Left & Right Groin  Last BM:  12/17  Height:  Ht Readings from Last 1 Encounters:  04/11/23 5\' 1"  (1.549  m)   Weight:  Wt Readings from Last 1 Encounters:  04/11/23 105.9 kg   Ideal Body Weight:  47.73 kg  BMI:  Body mass index is 44.11 kg/m.  Estimated Nutritional Needs:  Kcal:  1500-1800 kcals Protein:  80-105 grams Fluid:  >/= 1.5L    Shelle Iron RD, LDN Contact via Secure Chat.

## 2023-04-12 NOTE — Telephone Encounter (Signed)
Patient Product/process development scientist completed.    The patient is insured through [employer]. Patient has Medicare and is not eligible for a copay card, but may be able to apply for patient assistance, if available.    Ran test claim for Eliquis 5 mg and the current 30 day co-pay is $47.00.   This test claim was processed through [facility]- copay amounts may vary at other pharmacies due to pharmacy/plan contracts, or as the patient moves through the different stages of their insurance plan.     Roland Earl, CPHT Pharmacy Technician III Certified Patient Advocate [facility] Patient Advocate Team Direct Number: [phone]  Fax: [phone]

## 2023-04-12 NOTE — TOC Initial Note (Signed)
Transition of Care [facility]) - Initial/Assessment Note    Patient Details  Name: Christine [name] MRN: [id number] Date of Birth: 10/31/1950  [facility]) CM/SW Contact:    Lanier Clam, RN Phone Number: 04/12/2023, 1:01 PM  Clinical Narrative: Spoke to patient about d/c plans. From [facility] SN[facility]-does not want to return. PT cons ordered await recc prior faxing out.Has GT, & abd wound.                Expected Discharge Plan: Skilled Nursing Facility Barriers to Discharge: Continued Medical Work up   Patient Goals and CMS Choice            Expected Discharge Plan and Services                                              Prior Living Arrangements/Services                       Activities of Daily Living   ADL Screening (condition at time of admission) Independently performs ADLs?: No Does the patient have a NEW difficulty with bathing/dressing/toileting/self-feeding that is expected to last >3 days?: No Does the patient have a NEW difficulty with getting in/out of bed, walking, or climbing stairs that is expected to last >3 days?: No Does the patient have a NEW difficulty with communication that is expected to last >3 days?: No Is the patient deaf or have difficulty hearing?: No Does the patient have difficulty seeing, even when wearing glasses/contacts?: No Does the patient have difficulty concentrating, remembering, or making decisions?: Yes  Permission Sought/Granted                  Emotional Assessment              Admission diagnosis:  DVT (deep venous thrombosis) (HCC) [I82.409] Dislodged gastrostomy tube [N82.956O] Deep vein thrombosis (DVT) of proximal lower extremity, unspecified chronicity, unspecified laterality (HCC) [I82.4Y9] Patient Active Problem List   Diagnosis Date Noted   DVT (deep venous thrombosis) (HCC) 04/10/2023   Dislodged gastrostomy tube 04/10/2023   Hypotension 03/22/2023    Metabolic acidosis 03/22/2023   Bowel perforation (HCC) 03/21/2023   Pressure injury of skin 01/29/2023   Esophageal dysphagia 01/15/2023   Abnormal esophagram 01/15/2023   Hypoglycemia 01/15/2023   Hypernatremia 01/08/2023   Severe dehydration 01/08/2023   Altered mental status 01/08/2023   Failure to thrive in adult 01/08/2023   AKI (acute kidney injury) (HCC) 01/08/2023   Gallbladder dilatation 01/08/2023   SIRS (systemic inflammatory response syndrome) (HCC) 11/09/2022   Sepsis (HCC) 11/09/2022   Acute metabolic encephalopathy 11/09/2022   Renal insufficiency 11/09/2022   Hypokalemia 11/09/2022   Normocytic anemia 11/09/2022   Morbid obesity with BMI of 50.0-59.9, adult (HCC) 09/14/2022   Gastric bypass status for obesity 09/14/2022   Intermittent exophthalmos of right eye 06/09/2022   Gastric polyp 04/08/2022   Hyperlipidemia 05/20/2021   Lumbar radiculopathy 12/24/2020   Trigger little finger of left hand 12/16/2020   Medial epicondylitis of elbow, left 12/16/2020   Memory difficulties 12/11/2020   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   Globus sensation 11/15/2019   Gastroesophageal reflux disease 11/15/2019   Laryngopharyngeal reflux (LPR) 11/15/2019   Throat clearing 10/05/2019   Gout 08/31/2019   Phlebitis 08/31/2019   Prediabetes 08/31/2019   OA (osteoarthritis) of knee  05/17/2019   Acute pain of right knee 05/17/2019   Abdominal pain 03/31/2019   Morbid obesity (HCC) 03/29/2018   GAD (generalized anxiety disorder) 02/27/2018   Mixed incontinence urge and stress 02/27/2018   OSA on CPAP 06/16/2017   Acute appendicitis 05/06/2016   Gastro-esophageal reflux disease with esophagitis 07/26/2011   Essential hypertension 07/26/2011   Osteoarthrosis 10/16/2009   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, including shoulder 08/22/2008   Pre-operative cardiovascular examination 08/22/2008   Anxiety 04/27/2003   PCP:   Pcp, No Pharmacy:   [id number] - [place], [place] - [address] AT [facility] [address] [place] [address] Phone: [phone] Fax: [phone]  [facility] [id number] [place], [place] - [address] [place] [address] Phone: [phone] Fax: [phone]     Social Drivers of Health (SDOH) Social History: SDOH Screenings   Food Insecurity: No Food Insecurity (04/11/2023)  Housing: Low Risk  (04/11/2023)  Transportation Needs: No Transportation Needs (04/11/2023)  Utilities: Not At Risk (04/11/2023)  Alcohol Screen: Low Risk  (07/13/2022)  Depression (PHQ2-9): Low Risk  (01/21/2022)  Financial Resource Strain: Low Risk  (12/01/2022)   Received from [facility]  Physical Activity: Insufficiently Active (07/13/2022)  Social Connections: Moderately Integrated (12/01/2022)   Received from [facility]  Stress: No Stress Concern Present (07/13/2022)  Tobacco Use: Medium Risk (04/10/2023)  Health Literacy: Medium Risk (12/01/2022)   Received from [facility]   SDOH Interventions:     Readmission Risk Interventions    03/23/2023   10:43 AM 01/09/2023   11:11 AM  Readmission Risk Prevention Plan  Transportation Screening Complete Complete  HRI or Home Care Consult  Complete  Social Work Consult for Recovery Care Planning/Counseling  Complete  Palliative Care Screening  Not Applicable  Medication Review Oceanographer) Complete Complete  PCP or Specialist appointment within 3-5 days of discharge Complete   HRI or Home Care Consult Complete   SW Recovery Care/Counseling Consult Complete   Palliative Care Screening Not Applicable   Skilled Nursing Facility Complete

## 2023-04-12 NOTE — Plan of Care (Signed)
  Problem: Education: Goal: Knowledge of General Education information will improve Description: Including pain rating scale, medication(s)/side effects and non-pharmacologic comfort measures Outcome: Progressing   Problem: Coping: Goal: Level of anxiety will decrease Outcome: Progressing   Problem: Pain Management: Goal: General experience of comfort will improve Outcome: Progressing   Problem: Skin Integrity: Goal: Risk for impaired skin integrity will decrease Outcome: Progressing

## 2023-04-12 NOTE — Progress Notes (Signed)
   04/11/23 2324  BiPAP/CPAP/SIPAP  Reason BIPAP/CPAP not in use Non-compliant;Other(comment) (No longer uses.)

## 2023-04-12 NOTE — Progress Notes (Signed)
PROGRESS NOTE    Christine [name]  VOJ:[id number] DOB: 27-Jan-1951 DOA: 04/10/2023 PCP: Pcp, No    Brief Narrative:   72 year old with history of HTN, HLD, OSA on CPAP, GERD, anxiety, morbid obesity, esophageal dysmotility with gastric bypass 2024, failure to thrive with G-tube placement, sacral wound, ESBL UTI recently admitted from 11/25 - 12/11 for perforated ulcer at gastrojejunal anastomosis with anterior side anastomosis repaired with [name] patch and discharged to SNF.  Over past 3 days she has developed more abdominal pain located at the tube insertion site.  CT abdomen pelvis shows percutaneous G-tube with balloon in the subcutaneous fat.  Concerning of filling defect in the left superficial femoral vein concerning for DVT which is confirmed on the ultrasound. IR replaced G-tube on 12/16.  Transition heparin drip to p.o. Eliquis  Assessment & Plan:  Active Problems:   GAD (generalized anxiety disorder)   Gout   Failure to thrive in adult   Dislodged gastrostomy tube    Dislodged gastrostomy tube status post recent perforated marginal ulcer DVT in right femoral - IR replaced G-tube on 12/16, will consult dietitian to resume tube feeds.  Transition from drip to p.o. Eliquis -CT scan shows concerning for possible arterial clot but on the ultrasound it confirms the DVT.  Discussed with radiology, this is a typo in terms of arterial clot.  Indeed this is venous clot  Gastritis -Continue Protonix IV twice daily -Holding sucralfate   General Anxiety disorder -Cymbalta on hold  Normocytic anemia - Hemoglobin around baseline of 9.   Gout -Continue colchicine  Slowly resume tube feeds.  Allow p.o. diet.  Nutrition following  DVT prophylaxis: Eliquis Code Status: DNR Family Communication: Discussed with sister over the phone Status is: Inpatient Remains inpatient appropriate because: G tube repalcement today, then swtich po AC when stable Slowly uptitrated tube feeds,  hopefully discharge tomorrow   Subjective: Seen and examined at bedside, no complaints this morning.  She is hungry and she would like to try something oral along with resuming her tube feeds.   Examination:  General exam: Appears calm and comfortable  Respiratory system: Clear to auscultation. Respiratory effort normal. Cardiovascular system: S1 & S2 heard, RRR. No JVD, murmurs, rubs, gallops or clicks. No pedal edema. Gastrointestinal system: Abdomen is nondistended, soft and nontender. No organomegaly or masses felt. Normal bowel sounds heard. Central nervous system: Alert and oriented. No focal neurological deficits. Extremities: Symmetric 5 x 5 power. Skin: No rashes, lesions or ulcers Psychiatry: Judgement and insight appear normal. Mood & affect appropriate. G tube in place               Diet Orders (From admission, onward)     Start     Ordered   04/12/23 1106  Diet 2 gram sodium Room service appropriate? Yes; Fluid consistency: Thin  Diet effective now       Question Answer Comment  Room service appropriate? Yes   Fluid consistency: Thin      04/12/23 1105            Objective: Vitals:   04/11/23 2214 04/11/23 2324 04/12/23 0510 04/12/23 1215  BP: (!) 142/63  (!) 144/73 (!) 143/70  Pulse: 78  93 (!) 102  Resp: 18 17 19 20   Temp: (!) 97.5 F (36.4 C)  97.9 F (36.6 C) 99 F (37.2 C)  TempSrc: Oral  Oral Oral  SpO2: 100%  99% 99%  Weight:      Height:  Intake/Output Summary (Last 24 hours) at 04/12/2023 1221 Last data filed at 04/11/2023 1745 Gross per 24 hour  Intake 564.93 ml  Output --  Net 564.93 ml   Filed Weights   04/11/23 0803  Weight: 105.9 kg    Scheduled Meds:  apixaban  10 mg Oral BID   Followed by   Melene Muller ON 04/19/2023] apixaban  5 mg Oral BID   ascorbic acid  500 mg Oral Daily   ferrous sulfate  325 mg Oral Q breakfast   pantoprazole  40 mg Oral Daily   sucralfate  1 g Oral TID WC & HS   Continuous  Infusions:  magnesium sulfate bolus IVPB 4 g (04/12/23 1043)    Nutritional status     Body mass index is 44.11 kg/m.  Data Reviewed:   CBC: Recent Labs  Lab 04/10/23 2018 04/11/23 0615 04/12/23 0522  WBC 4.0 4.6 3.4*  NEUTROABS 2.1 1.9  --   HGB 9.1* 9.1* 7.5*  HCT 28.2* 27.8* 24.9*  MCV 92.8 93.0 95.8  PLT 613* 581* 499*   Basic Metabolic Panel: Recent Labs  Lab 04/10/23 2018 04/11/23 0615 04/12/23 0522  NA 133* 133* 134*  K 3.6 3.9 3.5  CL 103 103 103  CO2 23 24 23   GLUCOSE 105* 80 195*  BUN 9 10 14   CREATININE 0.53 0.61 0.65  CALCIUM 8.3* 8.3* 8.1*  MG  --   --  1.6*  PHOS  --   --  3.9   GFR: Estimated Creatinine Clearance: 73.3 mL/min (by C-G formula based on SCr of 0.65 mg/dL). Liver Function Tests: Recent Labs  Lab 04/10/23 2018 04/11/23 0615  AST 46* 50*  ALT 29 28  ALKPHOS 181* 169*  BILITOT 0.3 0.6  PROT 6.3* 6.6  ALBUMIN 1.8* 2.0*   Recent Labs  Lab 04/10/23 2018  LIPASE 28   No results for input(s): "AMMONIA" in the last 168 hours. Coagulation Profile: No results for input(s): "INR", "PROTIME" in the last 168 hours. Cardiac Enzymes: No results for input(s): "CKTOTAL", "CKMB", "CKMBINDEX", "TROPONINI" in the last 168 hours. BNP (last 3 results) No results for input(s): "PROBNP" in the last 8760 hours. HbA1C: No results for input(s): "HGBA1C" in the last 72 hours. CBG: Recent Labs  Lab 04/06/23 1124 04/12/23 0502 04/12/23 0536 04/12/23 0722 04/12/23 1134  GLUCAP 92 42* 77 54* 55*   Lipid Profile: No results for input(s): "CHOL", "HDL", "LDLCALC", "TRIG", "CHOLHDL", "LDLDIRECT" in the last 72 hours. Thyroid Function Tests: Recent Labs    04/11/23 1108  TSH 2.776   Anemia Panel: No results for input(s): "VITAMINB12", "FOLATE", "FERRITIN", "TIBC", "IRON", "RETICCTPCT" in the last 72 hours. Sepsis Labs: No results for input(s): "PROCALCITON", "LATICACIDVEN" in the last 168 hours.  No results found for this or any  previous visit (from the past 240 hours).       Radiology Studies: CT Angio Chest Pulmonary Embolism (PE) W or WO Contrast Result Date: 04/12/2023 CLINICAL DATA:  Short of breath, history of DVT EXAM: CT ANGIOGRAPHY CHEST WITH CONTRAST TECHNIQUE: Multidetector CT imaging of the chest was performed using the standard protocol during bolus administration of intravenous contrast. Multiplanar CT image reconstructions and MIPs were obtained to evaluate the vascular anatomy. RADIATION DOSE REDUCTION: This exam was performed according to the departmental dose-optimization program which includes automated exposure control, adjustment of the mA and/or kV according to patient size and/or use of iterative reconstruction technique. CONTRAST:  75mL OMNIPAQUE IOHEXOL 350 MG/ML SOLN COMPARISON:  01/15/2023 FINDINGS:  Cardiovascular: This is a technically adequate evaluation of the pulmonary vasculature. No filling defects or pulmonary emboli. The heart is enlarged without pericardial effusion. Normal caliber of the thoracic aorta. Atherosclerosis of the aorta and coronary vasculature. Mediastinum/Nodes: No enlarged mediastinal, hilar, or axillary lymph nodes. Thyroid gland, trachea, and esophagus demonstrate no significant findings. Lungs/Pleura: There is scattered ground-glass opacities throughout the lungs, favored to represent hypoventilatory changes rather than edema or inflammation/infection. There is dependent left lower lobe atelectasis. No effusion or pneumothorax. Central airways are patent. Upper Abdomen: Hepatic steatosis. Postsurgical changes from bariatric surgery. Musculoskeletal: No acute or destructive bony abnormalities. Reconstructed images demonstrate no additional findings. Review of the MIP images confirms the above findings. IMPRESSION: 1. No evidence of pulmonary embolus. 2. Scattered ground-glass opacities throughout the lungs, favored to represent hypoventilatory changes though edema or  inflammation/infection could have a similar appearance. 3. Cardiomegaly. 4. Aortic Atherosclerosis (ICD10-I70.0). Coronary artery atherosclerosis. 5. Hepatic steatosis. Electronically Signed   By: Sharlet Salina M.D.   On: 04/12/2023 08:54   IR Replc Gastro/Colonic Tube Percut W/Fluoro Result Date: 04/12/2023 INDICATION: Gastrostomy tube in gastric remnant post gastric bypass has dislodged into the subcutaneous fat of the abdominal wall. Replacement requested under fluoroscopy. EXAM: REPLACEMENT OF A GASTROSTOMY TUBE UNDER FLUOROSCOPY MEDICATIONS: None ANESTHESIA/SEDATION: None CONTRAST:  15 mL Omnipaque 300-administered into the gastric lumen. FLUOROSCOPY TIME:  Fluoroscopy Time: 1 minute and 18 seconds. 27.0 mGy. COMPLICATIONS: None immediate. PROCEDURE: Informed written consent was obtained from the patient after a thorough discussion of the procedural risks, benefits and alternatives. All questions were addressed. Maximal Sterile Barrier Technique was utilized including caps, mask, sterile gowns, sterile gloves, sterile drape, hand hygiene and skin antiseptic. A timeout was performed prior to the initiation of the procedure. A pre-existing 16 French balloon retention gastrostomy tube within the abdominal wall subcutaneous fat by CT was removed after deflation of the retention balloon. A 5 French catheter was advanced through the percutaneous tract and into the stomach. Intraluminal position was confirmed by injection of contrast. Over a guidewire, a new 18 French balloon retention gastrostomy tube was then advanced into the stomach. The retention balloon was inflated with 10 mL of saline. Position was confirmed by fluoroscopy after injection of contrast. FINDINGS: A gastrostomy tube was advanced into the stomach and positioned such that the tip is directed into the superior gastric remnant. IMPRESSION: Replacement of dislodged gastrostomy tube. A new 18 French balloon retention gastrostomy tube was advanced  into the gastric remnant stomach. Electronically Signed   By: Irish Lack M.D.   On: 04/12/2023 08:08   VAS [place] LOWER EXTREMITY VENOUS (DVT) Result Date: 04/11/2023  Lower Venous DVT Study Patient Name:  SREYA SNUFFER  Date of Exam:   04/11/2023 Medical Rec #: [id number]              Accession #:    [id number] Date of Birth: 1950-11-06             Patient Gender: F Patient Age:   80 years Exam Location:  [facility] Procedure:      VAS [place] LOWER EXTREMITY VENOUS (DVT) Referring Phys: DEBBY CROSLEY --------------------------------------------------------------------------------  Indications: Swelling.  Anticoagulation: Heparin. Limitations: Poor ultrasound/tissue interface and body habitus. Comparison Study: 04/10/2023 - CT ABDOMEN PELVIS W CONTRAST                    IMPRESSION:  1. Percutaneous gastrostomy tube with balloon in the                   subcutaneous                   fat of the left ventral abdomen. The tip is within the                   anterior                   abdominal wall and not in the stomach.                   2. Filling defect in the left common and superficial femoral                   arteries concerning for acute DVT. Ultrasound is recommended                   for                   confirmation.                   3. Slightly decreased gastritis. Decreased surrounding fluid                   and                   stranding. The previous pneumoperitoneum has resolved.                   4. Marked hepatic steatosis.                   5. Trace left pleural effusion.                   6. Postoperative change about the midline anterior abdomen                   with                   small hematomas or seromas. Performing Technologist: Chanda Busing RVT  Examination Guidelines: A complete evaluation includes B-mode imaging, spectral Doppler, color Doppler, and power Doppler as needed of all accessible portions of each vessel. Bilateral testing is  considered an integral part of a complete examination. Limited examinations for reoccurring indications may be performed as noted. The reflux portion of the exam is performed with the patient in reverse Trendelenburg.  +---------+---------------+---------+-----------+----------+-------------------+ RIGHT    CompressibilityPhasicitySpontaneityPropertiesThrombus Aging      +---------+---------------+---------+-----------+----------+-------------------+ CFV      Full           Yes      Yes                                      +---------+---------------+---------+-----------+----------+-------------------+ SFJ      Full                                                             +---------+---------------+---------+-----------+----------+-------------------+ FV Prox  Full                                                             +---------+---------------+---------+-----------+----------+-------------------+  FV Mid   Full                                                             +---------+---------------+---------+-----------+----------+-------------------+ FV DistalFull                                                             +---------+---------------+---------+-----------+----------+-------------------+ PFV      Full                                                             +---------+---------------+---------+-----------+----------+-------------------+ POP      Full           Yes      Yes                                      +---------+---------------+---------+-----------+----------+-------------------+ PTV      Full                                                             +---------+---------------+---------+-----------+----------+-------------------+ PERO                                                  Not well visualized +---------+---------------+---------+-----------+----------+-------------------+    +---------+---------------+---------+-----------+----------+--------------+ LEFT     CompressibilityPhasicitySpontaneityPropertiesThrombus Aging +---------+---------------+---------+-----------+----------+--------------+ CFV      Partial        No       No                   Acute          +---------+---------------+---------+-----------+----------+--------------+ SFJ      Full                                                        +---------+---------------+---------+-----------+----------+--------------+ FV Prox  Full                                                        +---------+---------------+---------+-----------+----------+--------------+ FV Mid   Full                                                        +---------+---------------+---------+-----------+----------+--------------+  FV DistalFull                                                        +---------+---------------+---------+-----------+----------+--------------+ PFV      Full                                                        +---------+---------------+---------+-----------+----------+--------------+ POP      Full           Yes      Yes                                 +---------+---------------+---------+-----------+----------+--------------+ PTV      Full                                                        +---------+---------------+---------+-----------+----------+--------------+ PERO     Full                                                        +---------+---------------+---------+-----------+----------+--------------+ EIV                     Yes      Yes                                 +---------+---------------+---------+-----------+----------+--------------+     Summary: RIGHT: - There is no evidence of deep vein thrombosis in the lower extremity. However, portions of this examination were limited- see technologist comments above.  - No cystic structure  found in the popliteal fossa.  LEFT: - Findings consistent with acute deep vein thrombosis involving the left common femoral vein.  - No cystic structure found in the popliteal fossa.  *See table(s) above for measurements and observations. Electronically signed by Gerarda Fraction on 04/11/2023 at 6:44:42 PM.    Final    CT ABDOMEN PELVIS W CONTRAST Result Date: 04/10/2023 CLINICAL DATA:  Abdominal pain with nausea. EXAM: CT ABDOMEN AND PELVIS WITH CONTRAST TECHNIQUE: Multidetector CT imaging of the abdomen and pelvis was performed using the standard protocol following bolus administration of intravenous contrast. RADIATION DOSE REDUCTION: This exam was performed according to the departmental dose-optimization program which includes automated exposure control, adjustment of the mA and/or kV according to patient size and/or use of iterative reconstruction technique. CONTRAST:  OMNIPAQUE IOHEXOL 300 MG/ML  SOLN COMPARISON:  CT abdomen and pelvis 03/21/2023 FINDINGS: Lower chest: Trace left pleural effusion. Hepatobiliary: Marked hepatic steatosis. Cholecystectomy. No biliary dilation. Pancreas: Unremarkable. Spleen: Unremarkable. Adrenals/Urinary Tract: Stable adrenal glands and kidneys. No urinary calculi or hydronephrosis. Unremarkable bladder. Stomach/Bowel: Postoperative change of Roux-en-Y gastric bypass. There is a percutaneous gastrostomy tube with balloon in the subcutaneous  fat of the left ventral abdomen. The tip is within the anterior abdominal wall and not in the stomach. Wall thickening and mucosal hyperenhancement about the stomach has slightly decreased from 03/21/2023. Decreased surrounding fluid and stranding. Normal caliber large and small bowel. Vascular/Lymphatic: Filling defects in the left common femoral and superficial femoral veins (circa series 2/image 81) appears similar to 03/21/2023. Mixing artifact can have this appearance however given similarity to 03/21/2023 this is concerning  for DVT. Aortic atherosclerosis. No enlarged abdominal or pelvic lymph nodes. Reproductive: Hysterectomy.  No adnexal mass. Other: The previous pneumoperitoneum has resolved. Stranding along the midline abdominal wall incision compatible with postoperative change. Small fluid collections measuring up to 3.1 cm likely postoperative hematomas or seromas. Musculoskeletal: No acute fracture. IMPRESSION: 1. Percutaneous gastrostomy tube with balloon in the subcutaneous fat of the left ventral abdomen. The tip is within the anterior abdominal wall and not in the stomach. 2. Filling defect in the left common and superficial femoral arteries concerning for acute DVT. Ultrasound is recommended for confirmation. 3. Slightly decreased gastritis. Decreased surrounding fluid and stranding. The previous pneumoperitoneum has resolved. 4. Marked hepatic steatosis. 5. Trace left pleural effusion. 6. Postoperative change about the midline anterior abdomen with small hematomas or seromas. Aortic Atherosclerosis (ICD10-I70.0). These results were called by telephone at the time of interpretation on 04/10/2023 at 10:56 pm to provider [facility] , who verbally acknowledged these results. Electronically Signed   By: Minerva Fester M.D.   On: 04/10/2023 23:08           LOS: 1 day   Time spent= 35 mins    Miguel Rota, MD [facility]  If 7PM-7AM, please contact night-coverage  04/12/2023, 12:21 PM

## 2023-04-13 DIAGNOSIS — I82401 Acute embolism and thrombosis of unspecified deep veins of right lower extremity: Secondary | ICD-10-CM | POA: Diagnosis not present

## 2023-04-13 LAB — BASIC METABOLIC PANEL
Anion gap: 9 (ref 5–15)
BUN: 13 mg/dL (ref 8–23)
CO2: 21 mmol/L — ABNORMAL LOW (ref 22–32)
Calcium: 8.1 mg/dL — ABNORMAL LOW (ref 8.9–10.3)
Chloride: 105 mmol/L (ref 98–111)
Creatinine, Ser: 0.7 mg/dL (ref 0.44–1.00)
GFR, Estimated: >60 mL/min (ref >60)
Glucose, Bld: 101 mg/dL — ABNORMAL HIGH (ref 70–99)
Potassium: 3.6 mmol/L (ref 3.5–5.1)
Sodium: 135 mmol/L (ref 135–145)

## 2023-04-13 LAB — PHOSPHORUS
Phosphorus: 3.9 mg/dL (ref 2.5–4.6)
Phosphorus: 3.5 mg/dL (ref 2.5–4.6)

## 2023-04-13 LAB — CBC
HCT: 26.2 % — ABNORMAL LOW (ref 36.0–46.0)
Hemoglobin: 8.4 g/dL — ABNORMAL LOW (ref 12.0–15.0)
MCH: 30.4 pg (ref 26.0–34.0)
MCHC: 32.1 g/dL (ref 30.0–36.0)
MCV: 94.9 fL (ref 80.0–100.0)
Platelets: 514 10*3/uL — ABNORMAL HIGH (ref 150–400)
RBC: 2.76 MIL/uL — ABNORMAL LOW (ref 3.87–5.11)
RDW: 17.9 % — ABNORMAL HIGH (ref 11.5–15.5)
WBC: 4.9 10*3/uL (ref 4.0–10.5)
nRBC: 0 % (ref 0.0–0.2)

## 2023-04-13 LAB — GLUCOSE, CAPILLARY
Glucose-Capillary: 89 mg/dL (ref 70–99)
Glucose-Capillary: 97 mg/dL (ref 70–99)
Glucose-Capillary: 107 mg/dL — ABNORMAL HIGH (ref 70–99)
Glucose-Capillary: 94 mg/dL (ref 70–99)
Glucose-Capillary: 105 mg/dL — ABNORMAL HIGH (ref 70–99)

## 2023-04-13 LAB — MAGNESIUM
Magnesium: 2.2 mg/dL (ref 1.7–2.4)
Magnesium: 2.1 mg/dL (ref 1.7–2.4)

## 2023-04-13 MED ORDER — APIXABAN 5 MG PO TABS: ORAL_TABLET | ORAL | Status: DC

## 2023-04-13 MED ORDER — SENNOSIDES-DOCUSATE SODIUM 8.6-50 MG PO TABS: 1.0000 | ORAL_TABLET | Freq: Every evening | ORAL | Status: DC | PRN

## 2023-04-13 MED ORDER — OXYCODONE HCL 5 MG PO TABS: 5.0000 mg | ORAL_TABLET | ORAL | 0 refills | Status: DC | PRN

## 2023-04-13 NOTE — Plan of Care (Signed)
  Problem: Clinical Measurements: Goal: Will remain free from infection Outcome: Progressing Goal: Respiratory complications will improve Outcome: Progressing Goal: Cardiovascular complication will be avoided Outcome: Progressing   Problem: Pain Management: Goal: General experience of comfort will improve Outcome: Progressing   Problem: Safety: Goal: Ability to remain free from injury will improve Outcome: Progressing   Problem: Skin Integrity: Goal: Risk for impaired skin integrity will decrease Outcome: Progressing

## 2023-04-13 NOTE — Plan of Care (Signed)
Pt took night medication per tube and had some snacks orally. BG maintained well throughout the night. Feeding rate advanced by10 ml/hr Q4 and reached to the goal 27ml/hr.   Problem: Education: Goal: Knowledge of General Education information will improve Description: Including pain rating scale, medication(s)/side effects and non-pharmacologic comfort measures Outcome: Progressing   Problem: Clinical Measurements: Goal: Will remain free from infection Outcome: Progressing   Problem: Nutrition: Goal: Adequate nutrition will be maintained Outcome: Progressing   Problem: Pain Management: Goal: General experience of comfort will improve Outcome: Progressing   Problem: Safety: Goal: Ability to remain free from injury will improve Outcome: Progressing

## 2023-04-13 NOTE — [place] FL2 (Signed)
[place] MEDICAID FL2 LEVEL OF CARE FORM     IDENTIFICATION  Patient Name: Christine [name] Birthdate: [birth year] Sex: female Admission Date (Current Location): 04/10/2023  [facility] and [place] Number:  Producer, television/film/video and Address:  [facility],  [address] [place], [place] [address]      Provider Number: [id number]  Attending Physician Name and Address:  Miguel Rota, MD  Relative Name and Phone Number:  PAYDEN, PAULINO (Sister)  [phone]    Current Level of Care: Hospital Recommended Level of Care: Skilled Nursing Facility Prior Approval Number:    Date Approved/Denied:   PASRR Number: [id number]  Discharge Plan: SNF    Current Diagnoses: Patient Active Problem List   Diagnosis Date Noted   DVT (deep venous thrombosis) (HCC) 04/10/2023   Dislodged gastrostomy tube 04/10/2023   Hypotension 03/22/2023   Metabolic acidosis 03/22/2023   Bowel perforation (HCC) 03/21/2023   Pressure injury of skin 01/29/2023   Esophageal dysphagia 01/15/2023   Abnormal esophagram 01/15/2023   Hypoglycemia 01/15/2023   Hypernatremia 01/08/2023   Severe dehydration 01/08/2023   Altered mental status 01/08/2023   Failure to thrive in adult 01/08/2023   AKI (acute kidney injury) (HCC) 01/08/2023   Gallbladder dilatation 01/08/2023   SIRS (systemic inflammatory response syndrome) (HCC) 11/09/2022   Sepsis (HCC) 11/09/2022   Acute metabolic encephalopathy 11/09/2022   Renal insufficiency 11/09/2022   Hypokalemia 11/09/2022   Normocytic anemia 11/09/2022   Morbid obesity with BMI of 50.0-59.9, adult (HCC) 09/14/2022   Gastric bypass status for obesity 09/14/2022   Intermittent exophthalmos of right eye 06/09/2022   Gastric polyp 04/08/2022   Hyperlipidemia 05/20/2021   Lumbar radiculopathy 12/24/2020   Trigger little finger of left hand 12/16/2020   Medial epicondylitis of elbow, left 12/16/2020   Memory difficulties 12/11/2020   Lipodermatosclerosis of  both lower extremities 04/30/2020   Globus sensation 11/15/2019   Gastroesophageal reflux disease 11/15/2019   Laryngopharyngeal reflux (LPR) 11/15/2019   Throat clearing 10/05/2019   Gout 08/31/2019   Phlebitis 08/31/2019   Prediabetes 08/31/2019   OA (osteoarthritis) of knee 05/17/2019   Acute pain of right knee 05/17/2019   Abdominal pain 03/31/2019   Morbid obesity (HCC) 03/29/2018   GAD (generalized anxiety disorder) 02/27/2018   Mixed incontinence urge and stress 02/27/2018   OSA on CPAP 06/16/2017   Acute appendicitis 05/06/2016   Gastro-esophageal reflux disease with esophagitis 07/26/2011   Essential hypertension 07/26/2011   Osteoarthrosis 10/16/2009   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, including shoulder 08/22/2008   Pre-operative cardiovascular examination 08/22/2008   Anxiety 04/27/2003    Orientation RESPIRATION BLADDER Height & Weight     Self, Time, Situation, Place  Normal Continent Weight: 99.9 kg Height:  5\' 1"  (154.9 cm)  BEHAVIORAL SYMPTOMS/MOOD NEUROLOGICAL BOWEL NUTRITION STATUS      Continent Diet, Feeding tube (2gm na;Jevity TF)  AMBULATORY STATUS COMMUNICATION OF NEEDS Skin   Limited Assist Verbally Surgical wounds (GT site)                       Personal Care Assistance Level of Assistance  Bathing, Feeding, Dressing Bathing Assistance: Limited assistance Feeding assistance: Limited assistance Dressing Assistance: Limited assistance     Functional Limitations Info  Sight, Hearing, Speech Sight Info: Impaired (eyeglasses) Hearing Info: Adequate Speech Info: Adequate    SPECIAL CARE FACTORS FREQUENCY  PT (By licensed PT), OT (By licensed OT)     PT Frequency:  5x week OT Frequency: 5x week            Contractures Contractures Info: Not present    Additional Factors Info  Code Status, Allergies Code Status Info: DNR Allergies Info: Prednisone, Tape, Sulfa Antibiotics            Current Medications (04/13/2023):  This is the current hospital active medication list Current Facility-Administered Medications  Medication Dose Route Frequency Provider Last Rate Last Admin   acetaminophen (TYLENOL) tablet 650 mg  650 mg Oral Q6H PRN Crosley, Debby, MD       Or   acetaminophen (TYLENOL) suppository 650 mg  650 mg Rectal Q6H PRN Gery Pray, MD       apixaban (ELIQUIS) tablet 10 mg  10 mg Oral BID Jamse Mead, RPH   10 mg at 04/13/23 4132   Followed by   Melene Muller ON 04/19/2023] apixaban (ELIQUIS) tablet 5 mg  5 mg Oral BID Gadhia, Jigna M, RPH       ascorbic acid (VITAMIN C) tablet 500 mg  500 mg Oral Daily Amin, Ankit C, MD   500 mg at 04/13/23 0949   cholecalciferol (VITAMIN D3) 25 MCG (1000 UNIT) tablet 5,000 Units  5,000 Units Oral Daily Amin, Ankit C, MD   5,000 Units at 04/13/23 0948   copper tablet 2 mg  2 mg Oral Daily Amin, Ankit C, MD       feeding supplement (JEVITY 1.2 CAL) liquid 1,000 mL  1,000 mL Per Tube Continuous Amin, Ankit C, MD 45 mL/hr at 04/13/23 0330 Rate Change at 04/13/23 0330   feeding supplement (PROSource TF20) liquid 60 mL  60 mL Per Tube Daily Amin, Ankit C, MD   60 mL at 04/13/23 0947   ferrous sulfate tablet 325 mg  325 mg Oral Q breakfast Amin, Ankit C, MD   325 mg at 04/13/23 0949   guaiFENesin (ROBITUSSIN) 100 MG/5ML liquid 5 mL  5 mL Oral Q4H PRN Amin, Ankit C, MD       hydrALAZINE (APRESOLINE) injection 10 mg  10 mg Intravenous Q4H PRN Amin, Ankit C, MD       HYDROmorphone (DILAUDID) injection 0.5 mg  0.5 mg Intravenous Q4H PRN Crosley, Debby, MD   0.5 mg at 04/12/23 1026   ipratropium-albuterol (DUONEB) 0.5-2.5 (3) MG/3ML nebulizer solution 3 mL  3 mL Nebulization Q4H PRN Amin, Ankit C, MD       metoprolol tartrate (LOPRESSOR) injection 5 mg  5 mg Intravenous Q4H PRN Amin, Ankit C, MD       multivitamin with minerals tablet 1 tablet  1 tablet Oral Daily Amin, Ankit C, MD   1 tablet at 04/13/23 0948   ondansetron (ZOFRAN) injection  4 mg  4 mg Intravenous Q6H PRN Amin, Ankit C, MD       ondansetron (ZOFRAN) tablet 4 mg  4 mg Oral Q6H PRN Crosley, Debby, MD       oxyCODONE (Oxy IR/ROXICODONE) immediate release tablet 5 mg  5 mg Oral Q4H PRN Crosley, Debby, MD   5 mg at 04/13/23 0438   pantoprazole (PROTONIX) EC tablet 40 mg  40 mg Oral Daily Amin, Ankit C, MD   40 mg at 04/13/23 0948   senna-docusate (Senokot-S) tablet 1 tablet  1 tablet Oral QHS PRN Amin, Ankit C, MD       sucralfate (CARAFATE) 1 GM/10ML suspension 1 g  1 g Oral TID WC & HS Amin, Ankit C, MD   1 g at  04/13/23 0947     Discharge Medications: Please see discharge summary for a list of discharge medications.  Relevant Imaging Results:  Relevant Lab Results:   Additional Information SS#[id number]  Jarris Kortz, Olegario Messier, [place]

## 2023-04-13 NOTE — TOC Progression Note (Signed)
Transition of Care [facility]) - Progression Note    Patient Details  Name: Christine [name] MRN: [id number] Date of Birth: 1951/03/02  Transition of Care [facility]) CM/SW Contact  Alver Leete, Olegario Messier, RN Phone Number: 04/13/2023, 12:23 PM  Clinical Narrative: faxed out per patient permission-she agrees to add [facility]-await bed offers,choice prior auth.      Expected Discharge Plan: Skilled Nursing Facility Barriers to Discharge: Continued Medical Work up  Expected Discharge Plan and Services                                               Social Determinants of Health (SDOH) Interventions SDOH Screenings   Food Insecurity: No Food Insecurity (04/11/2023)  Housing: Low Risk  (04/11/2023)  Transportation Needs: No Transportation Needs (04/11/2023)  Utilities: Not At Risk (04/11/2023)  Alcohol Screen: Low Risk  (07/13/2022)  Depression (PHQ2-9): Low Risk  (01/21/2022)  Financial Resource Strain: Low Risk  (12/01/2022)   Received from [facility]  Physical Activity: Insufficiently Active (07/13/2022)  Social Connections: Moderately Integrated (12/01/2022)   Received from [facility]  Stress: No Stress Concern Present (07/13/2022)  Tobacco Use: Medium Risk (04/10/2023)  Health Literacy: Medium Risk (12/01/2022)   Received from [facility]    Readmission Risk Interventions    03/23/2023   10:43 AM 01/09/2023   11:11 AM  Readmission Risk Prevention Plan  Transportation Screening Complete Complete  HRI or Home Care Consult  Complete  Social Work Consult for Recovery Care Planning/Counseling  Complete  Palliative Care Screening  Not Applicable  Medication Review Oceanographer) Complete Complete  PCP or Specialist appointment within 3-5 days of discharge Complete   HRI or Home Care Consult Complete   SW Recovery Care/Counseling Consult Complete   Palliative Care Screening Not Applicable   Skilled Nursing Facility Complete

## 2023-04-13 NOTE — Evaluation (Signed)
Occupational Therapy Evaluation Patient Details Name: Christine [name] MRN: [id number] DOB: May 23, 1950 Today's Date: 04/13/2023   History of Present Illness 72 year old  admitted with  abdominal pain located at the tube insertion site. CT abdomen pelvis shows percutaneous G-tube with balloon in the subcutaneous fat.IR replaced G-tube on 12/16. Left superficial femoral vein DVT. PMH: HTN, HLD, OSA on CPAP, GERD, anxiety, morbid obesity, esophageal dysmotility with gastric bypass 2024, failure to thrive with G-tube placement, sacral wound, ESBL UTI recently admitted from 11/25 - 12/11 for perforated ulcer at gastrojejunal anastomosis with anterior side anastomosis repaired with [name] patch   Clinical Impression   This 72 yo female admitted with above presents to acute OT with PLOF of being needing min A for stand pivot transfers, A for LBB/D and toileting, but could do most of her UB ADLs. She currently is close to baseline and will continue to benefit from acute OT with follow up from continued inpatient follow up therapy, <3 hours/day.       If plan is discharge home, recommend the following: A lot of help with walking and/or transfers;A lot of help with bathing/dressing/bathroom;Assistance with cooking/housework;Assist for transportation;Help with stairs or ramp for entrance    Functional Status Assessment  Patient has had a recent decline in their functional status and demonstrates the ability to make significant improvements in function in a reasonable and predictable amount of time.  Equipment Recommendations  Other (comment) (TBD next venue)       Precautions / Restrictions Precautions Precautions: Fall Precaution Comments: Gtube (recently dislodged at [facility]  and reinserted by IR  this adm 12/16) Restrictions Weight Bearing Restrictions Per Provider Order: No      Mobility Bed Mobility Overal bed mobility: Needs Assistance Bed Mobility: Supine to Sit   Sidelying to sit:  HOB elevated, Used rails, Supervision   Sit to supine: Contact guard assist, Used rails        Transfers Overall transfer level: Needs assistance Equipment used: Rolling walker (2 wheels)   Sit to Stand: Min assist     Step pivot transfers: Min assist            Balance Overall balance assessment: Needs assistance Sitting-balance support: No upper extremity supported, Feet supported Sitting balance-Leahy Scale: Good     Standing balance support: Bilateral upper extremity supported, Reliant on assistive device for balance Standing balance-Leahy Scale: Poor                             ADL either performed or assessed with clinical judgement   ADL Overall ADL's : Needs assistance/impaired Eating/Feeding: Independent;Sitting   Grooming: Set up;Sitting   Upper Body Bathing: Minimal assistance;Sitting   Lower Body Bathing: Total assistance Lower Body Bathing Details (indicate cue type and reason): min A sit<>stand Upper Body Dressing : Minimal assistance;Sitting   Lower Body Dressing: Total assistance Lower Body Dressing Details (indicate cue type and reason): min A sit<>stand Toilet Transfer: Minimal assistance;Rolling walker (2 wheels) Toilet Transfer Details (indicate cue type and reason): simulated by side stepping up to [facility] Toileting- Clothing Manipulation and Hygiene: Total assistance Toileting - Clothing Manipulation Details (indicate cue type and reason): min A sit<>stand             Vision Baseline Vision/History: 1 Wears glasses Ability to See in Adequate Light: 0 Adequate Patient Visual Report: No change from baseline              Pertinent  Vitals/Pain Pain Assessment Pain Assessment: Faces Faces Pain Scale: Hurts little more Pain Location: front peri area with wiping with wipes from purewick Pain Descriptors / Indicators: Burning, Grimacing Pain Intervention(s): Limited activity within patient's tolerance, Monitored during session      Extremity/Trunk Assessment Upper Extremity Assessment Upper Extremity Assessment: Generalized weakness           Communication Communication Communication: No apparent difficulties   Cognition Arousal: Alert   Overall Cognitive Status: Within Functional Limits for tasks assessed                                 General Comments: verbose        Exercises Other Exercises Other Exercises: Pt completed 10 reps each while seated EOB: cross reaching with arms for therapists hands, punching towards ceiling, reaching out to each side, kicking legs, lifting legs, lifting a leg then moving out ot side and back in        Home Living Family/patient expects to be discharged to:: Skilled nursing facility                                 Additional Comments: [facility] SNF      Prior Functioning/Environment Prior Level of Function : Needs assist;History of Falls (last six months)             Mobility Comments: hx of multiple falls ADLs Comments: bed bath at SNF,pt reports she can do some of her UB ADLs and can stand pivot to [facility] with min A but needs increased A for pericare        OT Problem List: Decreased safety awareness;Obesity;Pain      OT Treatment/Interventions: Self-care/ADL training;Therapeutic activities;Therapeutic exercise;Patient/family education;Balance training    OT Goals(Current goals can be found in the care plan section) Acute Rehab OT Goals Patient Stated Goal: to not go back to [facility] OT Goal Formulation: With patient Time For Goal Achievement: 05/04/23 Potential to Achieve Goals: Good  OT Frequency: Min 1X/week       AM-PAC OT "6 Clicks" Daily Activity     Outcome Measure Help from another person eating meals?: None Help from another person taking care of personal grooming?: A Little Help from another person toileting, which includes using toliet, bedpan, or urinal?: Total Help from another person  bathing (including washing, rinsing, drying)?: A Lot Help from another person to put on and taking off regular upper body clothing?: A Little Help from another person to put on and taking off regular lower body clothing?: Total 6 Click Score: 14   End of Session Equipment Utilized During Treatment: Rolling walker (2 wheels)  Activity Tolerance: Patient tolerated treatment well Patient left: in bed;with call bell/phone within reach;with bed alarm set  OT Visit Diagnosis: Unsteadiness on feet (R26.81);Other abnormalities of gait and mobility (R26.89);Muscle weakness (generalized) (M62.81);Pain Pain - Right/Left:  (front peri area with cleaning)                Time: 1610-9604 OT Time Calculation (min): 25 min Charges:  OT General Charges $OT Visit: 1 Visit OT Evaluation $OT Eval Moderate Complexity: 1 Mod OT Treatments $Self Care/Home Management : 8-22 mins  Lindon Romp OT Acute Rehabilitation Services Office [phone]    Evette Georges 04/13/2023, 11:13 AM

## 2023-04-13 NOTE — Progress Notes (Signed)
Pt is non compliant with CPAP.

## 2023-04-13 NOTE — Progress Notes (Signed)
PROGRESS NOTE    Christine [name]  ZOX:[id number] DOB: [birth year] DOA: 04/10/2023 PCP: Pcp, No    Brief Narrative:   72 year old with history of HTN, HLD, OSA on CPAP, GERD, anxiety, morbid obesity, esophageal dysmotility with gastric bypass 2024, failure to thrive with G-tube placement, sacral wound, ESBL UTI recently admitted from 11/25 - 12/11 for perforated ulcer at gastrojejunal anastomosis with anterior side anastomosis repaired with [name] patch and discharged to SNF.  Over past 3 days she has developed more abdominal pain located at the tube insertion site.  CT abdomen pelvis shows percutaneous G-tube with balloon in the subcutaneous fat.  Concerning of filling defect in the left superficial femoral vein concerning for DVT which is confirmed on the ultrasound. IR replaced G-tube on 12/16.  Transition heparin drip to p.o. Eliquis.  Awaiting SNF placement  Assessment & Plan:  Active Problems:   GAD (generalized anxiety disorder)   Gout   Failure to thrive in adult   Dislodged gastrostomy tube    Dislodged gastrostomy tube status post recent perforated marginal ulcer DVT in right femoral - IR replaced G-tube on 12/16, dietitian following.  Slowly uptitrate over time oral intake and down the road tube feeds can be potentially discontinued.  Now on Eliquis (new med) -CT scan shows concerning for possible arterial clot but on the ultrasound it confirms the DVT.  Discussed with radiology, this is a typo in terms of arterial clot.  Indeed this is venous clot  Gastritis Continue PPI.   General Anxiety disorder Apparently Cymbalta was stopped at SNF facility between recent admission and this admission. Confirmed by pharmacy team  Normocytic anemia - Hemoglobin around baseline of 9.   Gout -Continue colchicine  Slowly resume tube feeds.  Allow p.o. diet.  Nutrition following  DVT prophylaxis: Eliquis Code Status: DNR Family Communication: Discussed with sister over the  phone Status is: Inpatient Remains inpatient appropriate because: Switched to p.o. Eliquis.  Awaiting SNF   Subjective: Patient is doing well no complaints at this time.   Examination:  General exam: Appears calm and comfortable  Respiratory system: Clear to auscultation. Respiratory effort normal. Cardiovascular system: S1 & S2 heard, RRR. No JVD, murmurs, rubs, gallops or clicks. No pedal edema. Gastrointestinal system: Abdomen is nondistended, soft and nontender. No organomegaly or masses felt. Normal bowel sounds heard. Central nervous system: Alert and oriented. No focal neurological deficits. Extremities: Symmetric 5 x 5 power. Skin: No rashes, lesions or ulcers Psychiatry: Judgement and insight appear normal. Mood & affect appropriate. G tube in place               Diet Orders (From admission, onward)     Start     Ordered   04/12/23 1106  Diet 2 gram sodium Room service appropriate? Yes; Fluid consistency: Thin  Diet effective now       Question Answer Comment  Room service appropriate? Yes   Fluid consistency: Thin      04/12/23 1105            Objective: Vitals:   04/13/23 0414 04/13/23 0500 04/13/23 1142 04/13/23 1145  BP: (!) 147/56  (!) 165/70 131/61  Pulse: (!) 101  97   Resp: 18  18   Temp: 98.8 F (37.1 C)  98.7 F (37.1 C)   TempSrc: Oral  Oral   SpO2: 100%  100%   Weight:  99.9 kg    Height:        Intake/Output Summary (Last 24 hours)  at 04/13/2023 1216 Last data filed at 04/13/2023 4098 Gross per 24 hour  Intake 367.25 ml  Output 200 ml  Net 167.25 ml   Filed Weights   04/11/23 0803 04/13/23 0500  Weight: 105.9 kg 99.9 kg    Scheduled Meds:  apixaban  10 mg Oral BID   Followed by   Melene Muller ON 04/19/2023] apixaban  5 mg Oral BID   ascorbic acid  500 mg Oral Daily   cholecalciferol  5,000 Units Oral Daily   copper  2 mg Oral Daily   feeding supplement (PROSource TF20)  60 mL Per Tube Daily   ferrous sulfate  325 mg  Oral Q breakfast   multivitamin with minerals  1 tablet Oral Daily   pantoprazole  40 mg Oral Daily   sucralfate  1 g Oral TID WC & HS   Continuous Infusions:  feeding supplement (JEVITY 1.2 CAL) 45 mL/hr at 04/13/23 0330    Nutritional status Signs/Symptoms: other (comment) (other (comment) hypoglycemia with need to start tube feeds) Interventions: Refer to RD note for recommendations, MVI, Tube feeding Body mass index is 41.61 kg/m.  Data Reviewed:   CBC: Recent Labs  Lab 04/10/23 2018 04/11/23 0615 04/12/23 0522 04/13/23 0510  WBC 4.0 4.6 3.4* 4.9  NEUTROABS 2.1 1.9  --   --   HGB 9.1* 9.1* 7.5* 8.4*  HCT 28.2* 27.8* 24.9* 26.2*  MCV 92.8 93.0 95.8 94.9  PLT 613* 581* 499* 514*   Basic Metabolic Panel: Recent Labs  Lab 04/10/23 2018 04/11/23 0615 04/12/23 0522 04/12/23 1625 04/13/23 0510  NA 133* 133* 134*  --  135  K 3.6 3.9 3.5  --  3.6  CL 103 103 103  --  105  CO2 23 24 23   --  21*  GLUCOSE 105* 80 195*  --  101*  BUN 9 10 14   --  13  CREATININE 0.53 0.61 0.65  --  0.70  CALCIUM 8.3* 8.3* 8.1*  --  8.1*  MG  --   --  1.6* 2.7* 2.2  PHOS  --   --  3.9 3.9 3.9   GFR: Estimated Creatinine Clearance: 70.9 mL/min (by C-G formula based on SCr of 0.7 mg/dL). Liver Function Tests: Recent Labs  Lab 04/10/23 2018 04/11/23 0615  AST 46* 50*  ALT 29 28  ALKPHOS 181* 169*  BILITOT 0.3 0.6  PROT 6.3* 6.6  ALBUMIN 1.8* 2.0*   Recent Labs  Lab 04/10/23 2018  LIPASE 28   No results for input(s): "AMMONIA" in the last 168 hours. Coagulation Profile: No results for input(s): "INR", "PROTIME" in the last 168 hours. Cardiac Enzymes: No results for input(s): "CKTOTAL", "CKMB", "CKMBINDEX", "TROPONINI" in the last 168 hours. BNP (last 3 results) No results for input(s): "PROBNP" in the last 8760 hours. HbA1C: No results for input(s): "HGBA1C" in the last 72 hours. CBG: Recent Labs  Lab 04/12/23 1956 04/12/23 2351 04/13/23 0345 04/13/23 0812  04/13/23 1137  GLUCAP 90 90 89 97 107*   Lipid Profile: No results for input(s): "CHOL", "HDL", "LDLCALC", "TRIG", "CHOLHDL", "LDLDIRECT" in the last 72 hours. Thyroid Function Tests: Recent Labs    04/11/23 1108  TSH 2.776   Anemia Panel: No results for input(s): "VITAMINB12", "FOLATE", "FERRITIN", "TIBC", "IRON", "RETICCTPCT" in the last 72 hours. Sepsis Labs: No results for input(s): "PROCALCITON", "LATICACIDVEN" in the last 168 hours.  No results found for this or any previous visit (from the past 240 hours).  Radiology Studies: CT Angio Chest Pulmonary Embolism (PE) W or WO Contrast Result Date: 04/12/2023 CLINICAL DATA:  Short of breath, history of DVT EXAM: CT ANGIOGRAPHY CHEST WITH CONTRAST TECHNIQUE: Multidetector CT imaging of the chest was performed using the standard protocol during bolus administration of intravenous contrast. Multiplanar CT image reconstructions and MIPs were obtained to evaluate the vascular anatomy. RADIATION DOSE REDUCTION: This exam was performed according to the departmental dose-optimization program which includes automated exposure control, adjustment of the mA and/or kV according to patient size and/or use of iterative reconstruction technique. CONTRAST:  75mL OMNIPAQUE IOHEXOL 350 MG/ML SOLN COMPARISON:  01/15/2023 FINDINGS: Cardiovascular: This is a technically adequate evaluation of the pulmonary vasculature. No filling defects or pulmonary emboli. The heart is enlarged without pericardial effusion. Normal caliber of the thoracic aorta. Atherosclerosis of the aorta and coronary vasculature. Mediastinum/Nodes: No enlarged mediastinal, hilar, or axillary lymph nodes. Thyroid gland, trachea, and esophagus demonstrate no significant findings. Lungs/Pleura: There is scattered ground-glass opacities throughout the lungs, favored to represent hypoventilatory changes rather than edema or inflammation/infection. There is dependent left lower lobe  atelectasis. No effusion or pneumothorax. Central airways are patent. Upper Abdomen: Hepatic steatosis. Postsurgical changes from bariatric surgery. Musculoskeletal: No acute or destructive bony abnormalities. Reconstructed images demonstrate no additional findings. Review of the MIP images confirms the above findings. IMPRESSION: 1. No evidence of pulmonary embolus. 2. Scattered ground-glass opacities throughout the lungs, favored to represent hypoventilatory changes though edema or inflammation/infection could have a similar appearance. 3. Cardiomegaly. 4. Aortic Atherosclerosis (ICD10-I70.0). Coronary artery atherosclerosis. 5. Hepatic steatosis. Electronically Signed   By: Sharlet Salina M.D.   On: 04/12/2023 08:54   IR Replc Gastro/Colonic Tube Percut W/Fluoro Result Date: 04/12/2023 INDICATION: Gastrostomy tube in gastric remnant post gastric bypass has dislodged into the subcutaneous fat of the abdominal wall. Replacement requested under fluoroscopy. EXAM: REPLACEMENT OF A GASTROSTOMY TUBE UNDER FLUOROSCOPY MEDICATIONS: None ANESTHESIA/SEDATION: None CONTRAST:  15 mL Omnipaque 300-administered into the gastric lumen. FLUOROSCOPY TIME:  Fluoroscopy Time: 1 minute and 18 seconds. 27.0 mGy. COMPLICATIONS: None immediate. PROCEDURE: Informed written consent was obtained from the patient after a thorough discussion of the procedural risks, benefits and alternatives. All questions were addressed. Maximal Sterile Barrier Technique was utilized including caps, mask, sterile gowns, sterile gloves, sterile drape, hand hygiene and skin antiseptic. A timeout was performed prior to the initiation of the procedure. A pre-existing 16 French balloon retention gastrostomy tube within the abdominal wall subcutaneous fat by CT was removed after deflation of the retention balloon. A 5 French catheter was advanced through the percutaneous tract and into the stomach. Intraluminal position was confirmed by injection of  contrast. Over a guidewire, a new 18 French balloon retention gastrostomy tube was then advanced into the stomach. The retention balloon was inflated with 10 mL of saline. Position was confirmed by fluoroscopy after injection of contrast. FINDINGS: A gastrostomy tube was advanced into the stomach and positioned such that the tip is directed into the superior gastric remnant. IMPRESSION: Replacement of dislodged gastrostomy tube. A new 18 French balloon retention gastrostomy tube was advanced into the gastric remnant stomach. Electronically Signed   By: Irish Lack M.D.   On: 04/12/2023 08:08           LOS: 2 days   Time spent= 35 mins    Miguel Rota, MD [facility]  If 7PM-7AM, please contact night-coverage  04/13/2023, 12:16 PM

## 2023-04-14 DIAGNOSIS — R627 Adult failure to thrive: Secondary | ICD-10-CM | POA: Diagnosis not present

## 2023-04-14 DIAGNOSIS — M109 Gout, unspecified: Secondary | ICD-10-CM | POA: Diagnosis not present

## 2023-04-14 DIAGNOSIS — F411 Generalized anxiety disorder: Secondary | ICD-10-CM

## 2023-04-14 DIAGNOSIS — T85528A Displacement of other gastrointestinal prosthetic devices, implants and grafts, initial encounter: Secondary | ICD-10-CM | POA: Diagnosis not present

## 2023-04-14 LAB — BASIC METABOLIC PANEL
Anion gap: 8 (ref 5–15)
BUN: 15 mg/dL (ref 8–23)
CO2: 21 mmol/L — ABNORMAL LOW (ref 22–32)
Calcium: 8 mg/dL — ABNORMAL LOW (ref 8.9–10.3)
Chloride: 106 mmol/L (ref 98–111)
Creatinine, Ser: 0.64 mg/dL (ref 0.44–1.00)
GFR, Estimated: >60 mL/min (ref >60)
Glucose, Bld: 95 mg/dL (ref 70–99)
Potassium: 3.8 mmol/L (ref 3.5–5.1)
Sodium: 135 mmol/L (ref 135–145)

## 2023-04-14 LAB — CBC WITH DIFFERENTIAL/PLATELET
Abs Immature Granulocytes: 0.03 10*3/uL (ref 0.00–0.07)
Basophils Absolute: 0 10*3/uL (ref 0.0–0.1)
Basophils Relative: 1 %
Eosinophils Absolute: 0.2 10*3/uL (ref 0.0–0.5)
Eosinophils Relative: 4 %
HCT: 24.4 % — ABNORMAL LOW (ref 36.0–46.0)
Hemoglobin: 7.6 g/dL — ABNORMAL LOW (ref 12.0–15.0)
Immature Granulocytes: 1 %
Lymphocytes Relative: 45 %
Lymphs Abs: 2 10*3/uL (ref 0.7–4.0)
MCH: 29.7 pg (ref 26.0–34.0)
MCHC: 31.1 g/dL (ref 30.0–36.0)
MCV: 95.3 fL (ref 80.0–100.0)
Monocytes Absolute: 0.6 10*3/uL (ref 0.1–1.0)
Monocytes Relative: 13 %
Neutro Abs: 1.6 10*3/uL — ABNORMAL LOW (ref 1.7–7.7)
Neutrophils Relative %: 36 %
Platelets: 417 10*3/uL — ABNORMAL HIGH (ref 150–400)
RBC: 2.56 MIL/uL — ABNORMAL LOW (ref 3.87–5.11)
RDW: 18 % — ABNORMAL HIGH (ref 11.5–15.5)
WBC: 4.3 10*3/uL (ref 4.0–10.5)
nRBC: 0 % (ref 0.0–0.2)

## 2023-04-14 LAB — GLUCOSE, CAPILLARY
Glucose-Capillary: 100 mg/dL — ABNORMAL HIGH (ref 70–99)
Glucose-Capillary: 87 mg/dL (ref 70–99)
Glucose-Capillary: 89 mg/dL (ref 70–99)
Glucose-Capillary: 93 mg/dL (ref 70–99)
Glucose-Capillary: 94 mg/dL (ref 70–99)
Glucose-Capillary: 97 mg/dL (ref 70–99)

## 2023-04-14 LAB — PHOSPHORUS: Phosphorus: 3.3 mg/dL (ref 2.5–4.6)

## 2023-04-14 LAB — MAGNESIUM: Magnesium: 1.8 mg/dL (ref 1.7–2.4)

## 2023-04-14 MED ORDER — OXYCODONE HCL 5 MG PO TABS: 5.0000 mg | ORAL_TABLET | ORAL | Status: DC | PRN

## 2023-04-14 MED ORDER — MAGNESIUM SULFATE 2 GM/50ML IV SOLN
2.0000 g | Freq: Once | INTRAVENOUS | Status: AC
  Administered 2023-04-14: 2 g via INTRAVENOUS
  Filled 2023-04-14: qty 50

## 2023-04-14 MED ORDER — TRAMADOL HCL 50 MG PO TABS: 50.0000 mg | ORAL_TABLET | Freq: Four times a day (QID) | ORAL | Status: DC | PRN

## 2023-04-14 MED ORDER — TRAMADOL HCL 50 MG PO TABS
50.0000 mg | ORAL_TABLET | Freq: Four times a day (QID) | ORAL | Status: DC | PRN
  Administered 2023-04-14: 50 mg via ORAL
  Filled 2023-04-14: qty 1

## 2023-04-14 NOTE — TOC Progression Note (Signed)
Transition of Care [facility]) - Progression Note    Patient Details  Name: Christine [name] MRN: [id number] Date of Birth: Apr 14, 1951  Transition of Care [facility]) CM/SW Contact  Morrie Daywalt, Olegario Messier, RN Phone Number: 04/14/2023, 12:02 PM  Clinical Narrative:  Sherron Monday to patient about bed offers-list given await choice prior auth.     Expected Discharge Plan: Skilled Nursing Facility Barriers to Discharge: Continued Medical Work up  Expected Discharge Plan and Services                                               Social Determinants of Health (SDOH) Interventions SDOH Screenings   Food Insecurity: No Food Insecurity (04/11/2023)  Housing: Low Risk  (04/11/2023)  Transportation Needs: No Transportation Needs (04/11/2023)  Utilities: Not At Risk (04/11/2023)  Alcohol Screen: Low Risk  (07/13/2022)  Depression (PHQ2-9): Low Risk  (01/21/2022)  Financial Resource Strain: Low Risk  (12/01/2022)   Received from [facility]  Physical Activity: Insufficiently Active (07/13/2022)  Social Connections: Moderately Integrated (12/01/2022)   [facility] from [facility]  Stress: No Stress Concern Present (07/13/2022)  Tobacco Use: Medium Risk (04/10/2023)  Health Literacy: Medium Risk (12/01/2022)   Received from [facility]    Readmission Risk Interventions    03/23/2023   10:43 AM 01/09/2023   11:11 AM  Readmission Risk Prevention Plan  Transportation Screening Complete Complete  HRI or Home Care Consult  Complete  Social Work Consult for Recovery Care Planning/Counseling  Complete  Palliative Care Screening  Not Applicable  Medication Review Oceanographer) Complete Complete  PCP or Specialist appointment within 3-5 days of discharge Complete   HRI or Home Care Consult Complete   SW Recovery Care/Counseling Consult Complete   Palliative Care Screening Not Applicable   Skilled Nursing Facility Complete

## 2023-04-14 NOTE — Plan of Care (Signed)
  Problem: Education: Goal: Knowledge of General Education information will improve Description: Including pain rating scale, medication(s)/side effects and non-pharmacologic comfort measures Outcome: Progressing   Problem: Health Behavior/Discharge Planning: Goal: Ability to manage health-related needs will improve Outcome: Progressing   Problem: Clinical Measurements: Goal: Will remain free from infection Outcome: Progressing   Problem: Nutrition: Goal: Adequate nutrition will be maintained Outcome: Progressing   Problem: Pain Management: Goal: General experience of comfort will improve Outcome: Progressing   Problem: Skin Integrity: Goal: Risk for impaired skin integrity will decrease Outcome: Progressing

## 2023-04-14 NOTE — Discharge Summary (Signed)
Physician Discharge Summary   Patient: Christine [name] MRN: [id number] DOB: August 12, 1950  Admit date:     04/10/2023  Discharge date:   Discharge Physician: Marguerita Merles, DO   PCP: Pcp, No   Recommendations at discharge:   Follow up with PCP within 1-2 weeks and repeat CBC, CMP, Mag, Phos within 1 week  Discharge Diagnoses: Active Problems:   GAD (generalized anxiety disorder)   Gout   Failure to thrive in adult   Dislodged gastrostomy tube  Resolved Problems:   * No resolved hospital problems. [facility] Course: The patient is a 72 year old with history of HTN, HLD, OSA on CPAP, GERD, anxiety, morbid obesity, esophageal dysmotility with gastric bypass 2024, failure to thrive with G-tube placement, sacral wound, ESBL UTI recently admitted from 11/25 - 12/11 for perforated ulcer at gastrojejunal anastomosis with anterior side anastomosis repaired with [name] patch and discharged to SNF.  Over past 3 days she has developed more abdominal pain located at the tube insertion site.  CT abdomen pelvis shows percutaneous G-tube with balloon in the subcutaneous fat.  Concerning of filling defect in the left superficial femoral vein concerning for DVT which is confirmed on the ultrasound. IR replaced G-tube on 12/16.  Transition heparin drip to p.o. Eliquis.  Awaiting SNF placement  **12/19-12/20 had some issues with he G tube leaking so IR was re-consulted.  Subsequently the leaking has diminished and following discussion with interventional radiologist the patient made a decision not to pursue any further intervention at this time.  She is medically stable for D/C to SNF but awaiting English as a second language teacher.   Assessment & Plan:  Active Problems:   GAD (generalized anxiety disorder)   Gout   Failure to thrive in adult   Dislodged gastrostomy tube   Dislodged gastrostomy tube status post recent perforated marginal ulcer DVT in right femoral -IR replaced G-tube on 12/16, dietitian  following.  Slowly uptitrate over time oral intake and down the road tube feeds can be potentially discontinued.  -Now on Eliquis (new med) -CT scan shows concerning for possible arterial clot but on the ultrasound it confirms the DVT.  Discussed with radiology, this is a typo in terms of arterial clot.  Indeed this is venous clot -C/w Pain control with Acteaminophen 650 mg po q6hprn Mild Pain, Tramadol 50 mg po q6hprn Moderate Pain, and Oxycodone IR 5 mg po q4hprn Severe Pain and C/w IV Hydromorphone 0.5 mg q4hprn Severe Pain -She is medically stable for discharge at this time  General Anxiety Disorder -Apparently Cymbalta was stopped at SNF facility between recent admission and this admission.  -Confirmed by pharmacy team -Continue to Monitor closely   Metabolic Acidosis, improved -Had a slight metabolic acidosis with a CO2 of 20, anion gap of 8, chloride level 106; now she has a CO2 of 22, anion gap of 8, chloride level of 106 -Continue to Monitor and Trend and Repeat CMP within 1 week  Hypomagnesemia -Patient's Mag Level Trend: Recent Labs  Lab 04/12/23 0522 04/12/23 1625 04/13/23 0510 04/13/23 1717 04/14/23 0547 04/15/23 0457 04/16/23 0716  MG 1.6* 2.7* 2.2 2.1 1.8 1.8 1.6*  -Replete with IV Mag Sulfate 2 grams yesterday  -Continue to Monitor and Replete as Necessary -Repeat Mag in the AM   Normocytic Anemia -Hemoglobin around baseline of 9. -C/w Ferrous Sulfate 325 mg po Daily  -Hgb/Hct Trend: Recent Labs  Lab 04/10/23 2018 04/11/23 0615 04/12/23 0522 04/13/23 0510 04/14/23 0542 04/15/23 0457 04/16/23 0716  HGB 9.1* 9.1*  7.5* 8.4* 7.6* 8.1* 8.8*  HCT 28.2* 27.8* 24.9* 26.2* 24.4* 26.1* 29.5*  MCV 92.8 93.0 95.8 94.9 95.3 96.7 98.3  -Checked Anemia Panel and it showed an iron level of 45, UIBC of 134, TIBC 179, saturation ratios of 25%, ferritin level 206, folate of 10.8, vitamin B12 151 -Continue to Monitor for S/Sx of Bleeding; No overt bleeding noted -Repeat  CBC within 1 week    Gout -Not taking Colchicine 1.2 mg po Daily anymore  GERD/Gastritis/GI Prophylaxis -Continue PPI with Pantoprazole 40 mg po Daily and Sucralfate 1 gram po TIDwm and qHS  Hypoalbuminemia -Patient's Albumin Trend: Recent Labs  Lab 03/21/23 0715 03/23/23 0335 03/26/23 0338 03/27/23 0242 04/10/23 2018 04/11/23 0615 04/16/23 0716  ALBUMIN 2.7* 2.4* 1.9* 1.7* 1.8* 2.0* 2.0*  -Continue to Monitor and Trend and repeat CMP in the AM  Nutrition Status: Nutrition Problem: Altered nutrition lab value Etiology: 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   Weight as of this encounter: 99.1 kg.  -Weight Loss and Dietary Counseling given  Nutrition Documentation    Flowsheet Row ED to Hosp-Admission (Current) from 04/10/2023 in [place] 4TH FLOOR PROGRESSIVE CARE AND UROLOGY  Nutrition Problem Altered nutrition lab value  Etiology chronic illness  [gastric bypass with dumping syndrome]  Nutrition Goal Patient will meet greater than or equal to 90% of their needs  Interventions Refer to RD note for recommendations, MVI, Tube feeding      Consultants: IR Procedures performed: As delineated as above  Disposition: Skilled nursing facility Diet recommendation:  Cardiac diet DISCHARGE MEDICATION: Allergies as of 04/17/2023       Reactions   Prednisone Swelling   Legs swell    Tape Hives   Sulfa Antibiotics Rash        Medication List     STOP taking these medications    bacitracin 500 UNIT/GM ointment   DULoxetine 60 MG capsule Commonly known as: CYMBALTA   ferrous sulfate 325 (65 FE)  MG EC tablet Replaced by: ferrous sulfate 300 (60 Fe) MG/5ML syrup   folic acid 1 MG tablet Commonly known as: FOLVITE   gabapentin 100 MG capsule Commonly known as: NEURONTIN   promethazine 25 MG tablet Commonly known as: PHENERGAN   UNABLE TO FIND   zinc gluconate 50 MG tablet       TAKE these medications    acetaminophen 325 MG tablet Commonly known as: TYLENOL Take 650 mg by mouth every 4 (four) hours as needed for mild pain or moderate pain.   apixaban 5 MG Tabs tablet Commonly known as: ELIQUIS Take 2 tablets (10 mg total) by mouth 2 (two) times daily for 7 days, THEN 1 tablet (5 mg total) 2 (two) times daily for 23 days. Start taking on: April 13, 2023   ascorbic acid 500 MG tablet Commonly known as: VITAMIN C Place 1 tablet (500 mg total) into feeding tube daily. Start taking on: April 18, 2023 What changed: how to take this   colchicine 0.6 MG tablet Take 1.2 mg by mouth daily.   copper tablet Place 1 tablet (2 mg total) into feeding tube daily. Start taking on: April 18, 2023 What changed:  how to take this   feeding supplement (GLUCERNA SHAKE) Liqd Take 237 mLs by mouth daily. What changed: Another medication with the same name was removed. Continue taking this medication, and follow the directions you see here.   ferrous sulfate 300 (60 Fe) MG/5ML syrup Place 5 mLs (300 mg total) into feeding tube daily. Start taking on: April 18, 2023 Replaces: ferrous sulfate 325 (65 FE) MG EC tablet   furosemide 20 MG tablet Commonly known as: LASIX Take 1 tablet (20 mg total) by mouth daily.   guaiFENesin 100 MG/5ML liquid Commonly known as: ROBITUSSIN Place 5 mLs into feeding tube every 4 (four) hours as needed for cough or to loosen phlegm.   multivitamin with minerals Tabs tablet Place 1 tablet into feeding tube daily. Start taking on: April 18, 2023 What changed: how to take this   omeprazole 20 MG capsule Commonly known as:  PRILOSEC Take 1 capsule (20 mg total) by mouth 2 (two) times daily before a meal.   ondansetron 4 MG tablet Commonly known as: ZOFRAN Place 1 tablet (4 mg total) into feeding tube every 6 (six) hours as needed for nausea.   oxyCODONE 5 MG immediate release tablet Commonly known as: Oxy IR/ROXICODONE Take 1 tablet (5 mg total) by mouth every 4 (four) hours as needed for moderate pain (pain score 4-6) or severe pain (pain score 7-10).   senna-docusate 8.6-50 MG tablet Commonly known as: Senokot-S Place 1 tablet into feeding tube at bedtime as needed for moderate constipation.   sucralfate 1 GM/10ML suspension Commonly known as: CARAFATE Place 10 mLs (1 g total) into feeding tube 4 (four) times daily -  with meals and at bedtime. What changed: how to take this   thiamine 100 MG tablet Commonly known as: Vitamin B-1 Take 1 tablet (100 mg total) by mouth daily.   vitamin D3 25 MCG tablet Commonly known as: CHOLECALCIFEROL Place 5 tablets (5,000 Units total) into feeding tube daily. Start taking on: April 18, 2023 What changed: how to take this         Contact information for after-discharge care     Destination     [facility] .   Service: Skilled Nursing Contact information: [address] [place] [address] [phone]                    Discharge Exam: Filed Weights   04/14/23 0338 04/15/23 0500 04/16/23 0500  Weight: 99.7 kg 98.6 kg 99.1 kg   Vitals:   04/17/23 0435 04/17/23 1153  BP: 133/71 132/66  Pulse: (!) 106 99  Resp: 19   Temp: 98.9 F (37.2 C) 98.8 F (37.1 C)  SpO2: 100% 99%   Examination: Physical Exam:  Constitutional: WN/WD morbidly obese female in no acute distress Respiratory: Diminished to auscultation bilaterally, no wheezing, rales, rhonchi or crackles. Normal respiratory effort and patient is not tachypenic. No accessory muscle use.  Unlabored breathing Cardiovascular: RRR,  no murmurs / rubs / gallops. S1 and S2 auscultated. No extremity edema.  Abdomen: Soft, non-tender, distended secondary body habitus.  PEG in place. Bowel sounds positive.  GU: Deferred. Musculoskeletal: No clubbing / cyanosis of digits/nails. No joint deformity upper and lower extremities. Skin: No rashes, lesions, ulcers on limited skin evaluation. No induration; Warm and dry.  Neurologic: CN 2-12 grossly intact with no focal deficits. Romberg sign and cerebellar reflexes not assessed.  Psychiatric: Normal judgment and insight. Alert and oriented x 3.  Normal mood and appropriate affect.   Condition at discharge: stable  The results of significant diagnostics from this hospitalization (including imaging, microbiology, ancillary and laboratory) are listed below for reference.   Imaging Studies: CT Angio Chest Pulmonary Embolism (PE) W or WO Contrast Result Date: 04/12/2023 CLINICAL DATA:  Short of breath, history of DVT EXAM: CT ANGIOGRAPHY CHEST WITH CONTRAST TECHNIQUE: Multidetector CT imaging of the chest was performed using the standard protocol during bolus administration of intravenous contrast. Multiplanar CT image reconstructions and MIPs were obtained to evaluate the vascular anatomy. RADIATION DOSE REDUCTION: This exam was performed according to the departmental dose-optimization program which includes automated exposure control, adjustment of the mA and/or kV according to patient size and/or use of iterative reconstruction technique. CONTRAST:  75mL OMNIPAQUE IOHEXOL 350 MG/ML SOLN COMPARISON:  01/15/2023 FINDINGS: Cardiovascular: This is a technically adequate evaluation of the pulmonary vasculature. No filling defects or pulmonary emboli. The heart is enlarged without pericardial effusion. Normal caliber of the thoracic aorta. Atherosclerosis of the aorta and coronary vasculature. Mediastinum/Nodes: No enlarged mediastinal, hilar, or axillary lymph nodes. Thyroid gland, trachea, and  esophagus demonstrate no significant findings. Lungs/Pleura: There is scattered ground-glass opacities throughout the lungs, favored to represent hypoventilatory changes rather than edema or inflammation/infection. There is dependent left lower lobe atelectasis. No effusion or pneumothorax. Central airways are patent. Upper Abdomen: Hepatic steatosis. Postsurgical changes from bariatric surgery. Musculoskeletal: No acute or destructive bony abnormalities. Reconstructed images demonstrate no additional findings. Review of the MIP images confirms the above findings. IMPRESSION: 1. No evidence of pulmonary embolus. 2. Scattered ground-glass opacities throughout the lungs, favored to represent hypoventilatory changes though edema or inflammation/infection could have a similar appearance. 3. Cardiomegaly. 4. Aortic Atherosclerosis (ICD10-I70.0). Coronary artery atherosclerosis. 5. Hepatic steatosis. Electronically Signed   By: Sharlet Salina M.D.   On: 04/12/2023 08:54   IR Replc Gastro/Colonic Tube Percut W/Fluoro Result Date: 04/12/2023 INDICATION: Gastrostomy tube in gastric remnant post gastric bypass has dislodged into the subcutaneous fat of the abdominal wall. Replacement requested under fluoroscopy. EXAM: REPLACEMENT OF A GASTROSTOMY TUBE UNDER FLUOROSCOPY MEDICATIONS: None ANESTHESIA/SEDATION: None CONTRAST:  15 mL Omnipaque 300-administered into the gastric lumen. FLUOROSCOPY TIME:  Fluoroscopy Time: 1 minute and 18 seconds. 27.0 mGy. COMPLICATIONS: None immediate. PROCEDURE: Informed written consent was obtained from the patient after a thorough discussion of the procedural risks, benefits and alternatives. All questions were addressed. Maximal Sterile Barrier Technique was utilized including caps, mask, sterile gowns, sterile gloves, sterile drape, hand hygiene and skin antiseptic. A timeout was performed prior to the initiation of the procedure. A pre-existing 16 French balloon retention gastrostomy tube  within the abdominal wall subcutaneous fat by CT was removed after deflation of the retention balloon. A 5 French catheter was advanced through the percutaneous tract and into the stomach. Intraluminal position was confirmed by injection of contrast. Over a guidewire, a new 18 French balloon retention gastrostomy tube was then advanced into the stomach. The retention balloon was inflated with 10 mL of saline. Position was confirmed by fluoroscopy after injection of contrast. FINDINGS: A gastrostomy tube was advanced into the stomach and positioned such that the tip is directed into the superior gastric remnant. IMPRESSION: Replacement of dislodged gastrostomy tube. A new 18 French balloon retention gastrostomy tube was advanced into the gastric remnant stomach. Electronically Signed   By: Irish Lack M.D.   On: 04/12/2023 08:08   VAS [place] LOWER EXTREMITY VENOUS (DVT) Result Date: 04/11/2023  Lower Venous DVT Study Patient Name:  [facility] Vennie Homans  Date of Exam:   04/11/2023 Medical Rec #: [id number]              Accession #:    [id number] Date of Birth: March 30, 1951             Patient Gender: F Patient Age:   72 years Exam Location:  [facility] Procedure:      VAS [place] LOWER EXTREMITY VENOUS (DVT) Referring Phys: DEBBY CROSLEY --------------------------------------------------------------------------------  Indications: Swelling.  Anticoagulation: Heparin. Limitations: Poor ultrasound/tissue interface and body habitus. Comparison Study: 04/10/2023 - CT ABDOMEN PELVIS W CONTRAST                    IMPRESSION:                   1. Percutaneous gastrostomy tube with balloon in the                   subcutaneous                   fat of the left ventral abdomen. The tip is within the                   anterior                   abdominal wall and not in the stomach.                   2. Filling defect in the left common and superficial femoral                   arteries concerning for acute DVT.  Ultrasound is recommended                   for                   confirmation.                   3. Slightly decreased gastritis. Decreased surrounding fluid                   and                   stranding. The previous pneumoperitoneum has resolved.                   4. Marked hepatic steatosis.                   5. Trace left pleural effusion.                   6. Postoperative change about the midline anterior abdomen                   with                   small hematomas or seromas. Performing Technologist: Chanda Busing RVT  Examination Guidelines: A complete evaluation includes B-mode imaging, spectral Doppler, color Doppler, and power Doppler as needed of all accessible portions of each vessel. Bilateral testing is considered an integral part of a complete examination. Limited examinations for reoccurring indications may be performed as noted. The reflux portion of the exam is performed with the patient in reverse Trendelenburg.  +---------+---------------+---------+-----------+----------+-------------------+ RIGHT    CompressibilityPhasicitySpontaneityPropertiesThrombus Aging      +---------+---------------+---------+-----------+----------+-------------------+ CFV      Full  Yes      Yes                                      +---------+---------------+---------+-----------+----------+-------------------+ SFJ      Full                                                             +---------+---------------+---------+-----------+----------+-------------------+ FV Prox  Full                                                             +---------+---------------+---------+-----------+----------+-------------------+ FV Mid   Full                                                             +---------+---------------+---------+-----------+----------+-------------------+ FV DistalFull                                                              +---------+---------------+---------+-----------+----------+-------------------+ PFV      Full                                                             +---------+---------------+---------+-----------+----------+-------------------+ POP      Full           Yes      Yes                                      +---------+---------------+---------+-----------+----------+-------------------+ PTV      Full                                                             +---------+---------------+---------+-----------+----------+-------------------+ PERO                                                  Not well visualized +---------+---------------+---------+-----------+----------+-------------------+   +---------+---------------+---------+-----------+----------+--------------+ LEFT     CompressibilityPhasicitySpontaneityPropertiesThrombus Aging +---------+---------------+---------+-----------+----------+--------------+ CFV      Partial        No       No  Acute          +---------+---------------+---------+-----------+----------+--------------+ SFJ      Full                                                        +---------+---------------+---------+-----------+----------+--------------+ FV Prox  Full                                                        +---------+---------------+---------+-----------+----------+--------------+ FV Mid   Full                                                        +---------+---------------+---------+-----------+----------+--------------+ FV DistalFull                                                        +---------+---------------+---------+-----------+----------+--------------+ PFV      Full                                                        +---------+---------------+---------+-----------+----------+--------------+ POP      Full           Yes      Yes                                  +---------+---------------+---------+-----------+----------+--------------+ PTV      Full                                                        +---------+---------------+---------+-----------+----------+--------------+ PERO     Full                                                        +---------+---------------+---------+-----------+----------+--------------+ EIV                     Yes      Yes                                 +---------+---------------+---------+-----------+----------+--------------+     Summary: RIGHT: - There is no evidence of deep vein thrombosis in the lower extremity. However, portions of this examination were limited- see technologist comments above.  - No cystic structure found in the popliteal fossa.  LEFT: - Findings consistent with acute deep vein thrombosis involving the left common femoral vein.  - No cystic structure found in the popliteal fossa.  *See table(s) above for measurements and observations. Electronically signed by Gerarda Fraction on 04/11/2023 at 6:44:42 PM.    Final    CT ABDOMEN PELVIS W CONTRAST Result Date: 04/10/2023 CLINICAL DATA:  Abdominal pain with nausea. EXAM: CT ABDOMEN AND PELVIS WITH CONTRAST TECHNIQUE: Multidetector CT imaging of the abdomen and pelvis was performed using the standard protocol following bolus administration of intravenous contrast. RADIATION DOSE REDUCTION: This exam was performed according to the departmental dose-optimization program which includes automated exposure control, adjustment of the mA and/or kV according to patient size and/or use of iterative reconstruction technique. CONTRAST:  OMNIPAQUE IOHEXOL 300 MG/ML  SOLN COMPARISON:  CT abdomen and pelvis 03/21/2023 FINDINGS: Lower chest: Trace left pleural effusion. Hepatobiliary: Marked hepatic steatosis. Cholecystectomy. No biliary dilation. Pancreas: Unremarkable. Spleen: Unremarkable. Adrenals/Urinary Tract: Stable adrenal glands and kidneys. No  urinary calculi or hydronephrosis. Unremarkable bladder. Stomach/Bowel: Postoperative change of Roux-en-Y gastric bypass. There is a percutaneous gastrostomy tube with balloon in the subcutaneous fat of the left ventral abdomen. The tip is within the anterior abdominal wall and not in the stomach. Wall thickening and mucosal hyperenhancement about the stomach has slightly decreased from 03/21/2023. Decreased surrounding fluid and stranding. Normal caliber large and small bowel. Vascular/Lymphatic: Filling defects in the left common femoral and superficial femoral veins (circa series 2/image 81) appears similar to 03/21/2023. Mixing artifact can have this appearance however given similarity to 03/21/2023 this is concerning for DVT. Aortic atherosclerosis. No enlarged abdominal or pelvic lymph nodes. Reproductive: Hysterectomy.  No adnexal mass. Other: The previous pneumoperitoneum has resolved. Stranding along the midline abdominal wall incision compatible with postoperative change. Small fluid collections measuring up to 3.1 cm likely postoperative hematomas or seromas. Musculoskeletal: No acute fracture. IMPRESSION: 1. Percutaneous gastrostomy tube with balloon in the subcutaneous fat of the left ventral abdomen. The tip is within the anterior abdominal wall and not in the stomach. 2. Filling defect in the left common and superficial femoral arteries concerning for acute DVT. Ultrasound is recommended for confirmation. 3. Slightly decreased gastritis. Decreased surrounding fluid and stranding. The previous pneumoperitoneum has resolved. 4. Marked hepatic steatosis. 5. Trace left pleural effusion. 6. Postoperative change about the midline anterior abdomen with small hematomas or seromas. Aortic Atherosclerosis (ICD10-I70.0). These results were called by telephone at the time of interpretation on 04/10/2023 at 10:56 pm to provider [facility] , who verbally acknowledged these results. Electronically Signed   By:  Minerva Fester M.D.   On: 04/10/2023 23:08   DG UGI W SINGLE CM (SOL OR THIN BA) Result Date: 03/25/2023 CLINICAL DATA:  [id number] Perforated ulcer (HCC) [id number]. Postoperative. Rule out leak. EXAM: WATER SOLUBLE UPPER GI SERIES TECHNIQUE: Single-column upper GI series was performed using water soluble contrast. Radiation Exposure Index (as provided by the fluoroscopic device): 289.6 mGy Kerma CONTRAST:  100 mL of Omnipaque 300. COMPARISON:  None Available. FLUOROSCOPY: Fluoroscopy Time:  3 minutes FINDINGS: Scout: No free air under the domes of diaphragm. Left lung base and left lateral costophrenic angle appear clear. Patient is status post repair of perforated marginal ulcer. No evidence of leak. There is normal passage of barium from the esophagus into the proximal stomach and from the stomach into the jejunum via gastrojejunostomy anastomosis. No abnormal holdup. No large ulcer seen. The excluded stomach was not opacified, as expected. There is also smooth passage  of contrast into the distal jejunum and via jejunojejunostomy. No abnormal holdup. No large ulcer seen. IMPRESSION: *No evidence of leak.  Please see above for details. Electronically Signed   By: Jules Schick M.D.   On: 03/25/2023 11:00   ECHOCARDIOGRAM COMPLETE Result Date: 03/23/2023    ECHOCARDIOGRAM REPORT   Patient Name:   DALPHINE DEMARR Date of Exam: 03/23/2023 Medical Rec #:  [id number]      Height:       61.0 in Accession #:    [id number]     Weight:       227.1 lb Date of Birth:  1950/06/19     BSA:          1.993 m Patient Age:    72 years       BP:           142/81 mmHg Patient Gender: F              HR:           120 bpm. Exam Location:  Inpatient Procedure: 2D Echo, Cardiac Doppler, Color Doppler and Intracardiac            Opacification Agent Indications:    Abnormal ECG  History:        Patient has prior history of Echocardiogram examinations, most                 recent 12/03/2019. Risk Factors:Dyslipidemia.  Sonographer:     Karma Ganja Referring Phys: [id number] DANIEL V THOMPSON  Sonographer Comments: No subcostal window and suboptimal apical window. Image acquisition challenging due to patient body habitus and Image acquisition challenging due to uncooperative patient. IMPRESSIONS  1. Left ventricular ejection fraction, by estimation, is 50 to 55%. The left ventricle has low normal function. The left ventricle has no regional wall motion abnormalities. Indeterminate diastolic filling due to E-A fusion.  2. Right ventricular systolic function is normal. The right ventricular size is normal. There is mildly elevated pulmonary artery systolic pressure. The estimated right ventricular systolic pressure is 40.2 mmHg.  3. Left atrial size was mildly dilated.  4. The mitral valve is normal in structure. No evidence of mitral valve regurgitation. No evidence of mitral stenosis.  5. The aortic valve is tricuspid. There is mild calcification of the aortic valve. Aortic valve regurgitation is mild. Aortic valve sclerosis is present, with no evidence of aortic valve stenosis. FINDINGS  Left Ventricle: Left ventricular ejection fraction, by estimation, is 50 to 55%. The left ventricle has low normal function. The left ventricle has no regional wall motion abnormalities. Definity contrast agent was given IV to delineate the left ventricular endocardial borders. The left ventricular internal cavity size was normal in size. There is no left ventricular hypertrophy. Abnormal (paradoxical) septal motion, consistent with left bundle branch block. Indeterminate diastolic filling due to E-A fusion. Right Ventricle: The right ventricular size is normal. Right vetricular wall thickness was not well visualized. Right ventricular systolic function is normal. There is mildly elevated pulmonary artery systolic pressure. The tricuspid regurgitant velocity  is 3.05 m/s, and with an assumed right atrial pressure of 3 mmHg, the estimated right ventricular systolic  pressure is 40.2 mmHg. Left Atrium: Left atrial size was mildly dilated. Right Atrium: Right atrial size was not well visualized. Pericardium: There is no evidence of pericardial effusion. Mitral Valve: The mitral valve is normal in structure. No evidence of mitral valve regurgitation. No evidence of mitral valve stenosis. Tricuspid Valve: The tricuspid valve  is normal in structure. Tricuspid valve regurgitation is trivial. Aortic Valve: The aortic valve is tricuspid. There is mild calcification of the aortic valve. Aortic valve regurgitation is mild. Aortic valve sclerosis is present, with no evidence of aortic valve stenosis. Aortic valve mean gradient measures 7.0 mmHg. Aortic valve peak gradient measures 13.7 mmHg. Aortic valve area, by VTI measures 1.90 cm. Pulmonic Valve: The pulmonic valve was not assessed. Pulmonic valve regurgitation is not visualized. No evidence of pulmonic stenosis. Aorta: The aortic root and ascending aorta are structurally normal, with no evidence of dilitation. IAS/Shunts: The interatrial septum was not well visualized.  LEFT VENTRICLE PLAX 2D LVIDd:         3.45 cm     Diastology LVIDs:         2.60 cm     LV e' medial:    10.40 cm/s LV PW:         1.10 cm     LV E/e' medial:  9.6 LV IVS:        1.05 cm     LV e' lateral:   5.87 cm/s LVOT diam:     1.90 cm     LV E/e' lateral: 17.0 LV SV:         48 LV SV Index:   24 LVOT Area:     2.84 cm  LV Volumes (MOD) LV vol d, MOD A2C: 39.2 ml LV vol d, MOD A4C: 60.7 ml LV vol s, MOD A2C: 18.9 ml LV vol s, MOD A4C: 29.2 ml LV SV MOD A2C:     20.3 ml LV SV MOD A4C:     60.7 ml LV SV MOD BP:      26.7 ml RIGHT VENTRICLE RV S prime:     15.60 cm/s TAPSE (M-mode): 3.4 cm LEFT ATRIUM         Index LA diam:    4.00 cm 2.01 cm/m  AORTIC VALVE AV Area (Vmax):    1.93 cm AV Area (Vmean):   1.86 cm AV Area (VTI):     1.90 cm AV Vmax:           185.00 cm/s AV Vmean:          124.000 cm/s AV VTI:            0.252 m AV Peak Grad:      13.7 mmHg AV  Mean Grad:      7.0 mmHg LVOT Vmax:         126.00 cm/s LVOT Vmean:        81.400 cm/s LVOT VTI:          0.169 m LVOT/AV VTI ratio: 0.67  AORTA Ao Root diam: 2.50 cm MITRAL VALVE                TRICUSPID VALVE MV Area (PHT): 7.16 cm     TR Peak grad:   37.2 mmHg MV Decel Time: 106 msec     TR Vmax:        305.00 cm/s MV E velocity: 100.00 cm/s MV A velocity: 34.30 cm/s   SHUNTS MV E/A ratio:  2.92         Systemic VTI:  0.17 m                             Systemic Diam: 1.90 cm Rachelle Hora Croitoru MD Electronically signed by Thurmon Fair MD Signature Date/Time: 03/23/2023/4:15:40 PM  Final    DG Fluoro Rm 1-60 Min Result Date: 03/22/2023 INDICATION: Evaluate gastrostomy tube. Gastrostomy tube appeared to be malpositioned on CT. EXAM: FLUOROSCOPY TIME FOR G-TUBE INJECTION MEDICATIONS: None ANESTHESIA/SEDATION: None CONTRAST:  10 mL-administered into the gastric lumen. FLUOROSCOPY: Fluoroscopy time was included in the upper GI examination. COMPLICATIONS: None immediate. PROCEDURE: This procedure was performed immediately following the upper GI procedure while the patient was still in the fluoroscopy room. The gastrostomy tube was injected with contrast. Gastrostomy tube was flushed with water after the injection. FINDINGS: Gastrostomy tube injection demonstrates filling of the stomach. There was no evidence for contrast extravasation or leak. Gastrostomy tube flushed easily. IMPRESSION: Gastrostomy tube communicates with the stomach. No evidence for a leak. Electronically Signed   By: Richarda Overlie M.D.   On: 03/22/2023 11:30   IR GASTROSTOMY TUBE REMOVAL/REPAIR Result Date: 03/21/2023 INDICATION: 72 year old with pneumoperitoneum and leak near the gastrojejunostomy anastomosis. In addition, the patient has pain at the gastrostomy tube site and CT demonstrates that the gastrostomy tube balloon is partially within the ventral abdominal wall. EXAM: 1. Gastrostomy tube injection and manipulation with fluoroscopy  MEDICATIONS: Viscous lidocaine ANESTHESIA/SEDATION: None CONTRAST:  25 mL Omnipaque 300-administered into the gastric lumen. FLUOROSCOPY: Radiation Exposure Index (as provided by the fluoroscopic device): 56 mGy Kerma COMPLICATIONS: None immediate. PROCEDURE: Informed consent was obtained for gastrostomy tube injection and exchange. The gastrostomy tube was initially injected with contrast immediately following the upper GI while the patient was still on the fluoroscopy table. The gastrostomy tube injection confirmed filling of the stomach without a leak. However, the gastrostomy tube would not move freely within the stomach suggesting that the balloon was stuck within the ventral abdominal wall. Therefore, the patient was transferred to Interventional Radiology for exchange or manipulation of the gastrostomy tube. Patient was brought into the interventional radiology suite. The anterior abdomen was prepped and draped in sterile fashion. Maximal barrier sterile technique was utilized including caps, mask, sterile gowns, sterile gloves, sterile drape, hand hygiene and skin antiseptic. Viscous lidocaine was placed along the gastrostomy tube tract. Following the administration of the viscous lidocaine, the gastrostomy tube balloon advanced into the stomach lumen and contrast injection confirmed placement within the gastric lumen. The balloon was deflated. The balloon was re-inflated with 4 mL of saline. Additional contrast was injected to confirm placement in the stomach. No evidence for a leak. FINDINGS: The gastrostomy tube balloon appeared to be stuck within the ventral abdominal wall but the gastrostomy tube was still communicating with the gastric lumen and there was no evidence for a leak. The balloon was advanced back into the stomach lumen after viscous lidocaine was injected around the tube. At the end of the procedure, the gastrostomy tube was well positioned within the stomach. Currently, there is 4 mL of  saline within the gastrostomy tube retention balloon. IMPRESSION: Successful repositioning of the gastrostomy tube balloon. Contrast injection confirmed placement in the stomach. No evidence for a leak at the gastrostomy tube site. Electronically Signed   By: Richarda Overlie M.D.   On: 03/21/2023 16:18   DG UGI W SINGLE CM (SOL OR THIN BA) Result Date: 03/21/2023 CLINICAL DATA:  Gastric bypass with suspected leak on CT abdomen. EXAM: WATER SOLUBLE UPPER GI SERIES TECHNIQUE: Single-column upper GI series was performed using water soluble contrast. Radiation Exposure Index (as provided by the fluoroscopic device): 69.8 mGy Kerma CONTRAST:  50 cc Omnipaque 300. COMPARISON:  CT abdomen pelvis 03/21/2023. FLUOROSCOPY: Fluoroscopy Time: Radiation Exposure Index (if provided  by the fluoroscopic device): 69.8 Number of Acquired Spot Images: 8 FINDINGS: Scout view of the abdomen shows postoperative changes in the left upper quadrant related to gastric bypass. Surgical clips in the right upper quadrant. Mild gaseous distension of colon. Residual excreted contrast in the intrarenal collecting systems and bladder from CT earlier today. Patient was imaged in the 45 degree recumbent AP, LPO and RPO positions. Initially, there is filling of the gastric pouch with reflux of contrast into the esophagus. Subsequent imaging shows leakage of contrast from the distal gastric pouch, in the region of the anastomosis. IMPRESSION: Gastric bypass with a gastrojejunostomy anastomotic leak. Electronically Signed   By: Leanna Battles M.D.   On: 03/21/2023 15:24   CT ABDOMEN PELVIS W CONTRAST Addendum Date: 03/21/2023 ADDENDUM REPORT: 03/21/2023 12:52 ADDENDUM: Small pockets of gas situated between the bowel and liver. Evidence for an ulceration near the GJ anastomosis on image 52/8 and image 50/8. Findings are suggestive for an anastomotic ulceration and likely the source for the free air. These results were communicated on 03/21/2023 at  12:49 pm to provider Hosie Spangle, who acknowledged these results. Electronically Signed   By: Richarda Overlie M.D.   On: 03/21/2023 12:52   Result Date: 03/21/2023 CLINICAL DATA:  72 year old female with abdominal pain. Pain at gastrostomy tube placed about 5 weeks ago. EXAM: CT ABDOMEN AND PELVIS WITH CONTRAST TECHNIQUE: Multidetector CT imaging of the abdomen and pelvis was performed using the standard protocol following bolus administration of intravenous contrast. RADIATION DOSE REDUCTION: This exam was performed according to the departmental dose-optimization program which includes automated exposure control, adjustment of the mA and/or kV according to patient size and/or use of iterative reconstruction technique. CONTRAST:  OMNIPAQUE IOHEXOL 300 MG/ML  SOLN COMPARISON:  CT Abdomen and Pelvis 01/08/2023. FINDINGS: Lower chest: Heart size remains normal. No pericardial or pleural effusion. Increased patchy lung base opacity most resembling atelectasis. Hepatobiliary: Pneumoperitoneum. Small volume of perihepatic free fluid with simple fluid density. Hepatic steatosis. Absent gallbladder. Pancreas: Mild inflammation in the lesser sac appears related to the stomach rather than the pancreas. Negative pancreas otherwise. Spleen: Trace perisplenic free fluid. Adrenals/Urinary Tract: Normal adrenal glands. Nonobstructed kidneys. Chronic renal cysts (no follow-up imaging recommended). Absent renal contrast excretion on the delayed images. But the ureters are decompressed. Bladder is decompressed. Chronic pelvic phleboliths. Stomach/Bowel: Chronic Roux-en-Y type gastrojejunostomy with a superimposed percutaneous gastrostomy tube into the distal portion of the stomach. Abnormal pneumoperitoneum, moderate volume in the upper abdomen and inflammation surrounding the stomach and gastrojejunostomy (series 4, images 22 and 24). Furthermore, the balloon of the enteric tube is at the level of the abdominal wall,  peritoneal lining on series 4, image 33. Mild regional inflammation. No fluid collection. The distal stomach and proximal duodenum also appear inflamed. Distal duodenum and ligament of Treitz have a more normal appearance. Downstream small bowel anastomosis on series 4, image 36 appears negative. Superimposed large bowel is redundant but mostly decompressed. Mild secondary inflammation of the splenic flexure with regional pneumoperitoneum there. Negative right colon. Cecum on a lax mesentery series 4, image 51. Evidence of prior appendectomy. Vascular/Lymphatic: Calcified aortic atherosclerosis. Major arterial structures in the abdomen and pelvis are patent. Normal caliber abdominal aorta. Portal venous system appears to be patent. No lymphadenopathy identified. Reproductive: Chronically absent uterus. Ovaries are within normal limits. Other: Small volume of simple density free fluid at the pelvic inlet on the right series 4, image 65. And small volume of what appears to be fluid  tracking into and otherwise fat containing right inguinal hernia (series 4, image 78), new since September. Musculoskeletal: Advanced lumbar facet arthropathy. Stable visualized osseous structures. IMPRESSION: 1. Malpositioned percutaneous Gastrostomy Tube, with balloon pulled out through the ventral gastric wall and associated pneumoperitoneum and free fluid in the abdomen. The stomach, a chronic gastrojejunostomy, and the proximal duodenum all appear inflamed. But no drainable fluid collection, abscess. 2. Underlying chronic Roux-en-Y type gastric bypass. Small bowel distal to the gastrojejunostomy and the ligament of Treitz appears to remain normal. Mild secondary inflammation of the splenic flexure. 3. Small volume of free fluid in the pelvis, within a right inguinal hernia appears related to #1. 4. Hepatic steatosis. Lung base atelectasis. Aortic Atherosclerosis (ICD10-I70.0). Electronically Signed: By: Odessa Fleming M.D. On: 03/21/2023  09:48    Microbiology: Results for orders placed or performed during the hospital encounter of 03/21/23  MRSA Next Gen by PCR, Nasal     Status: None   Collection Time: 03/21/23  8:21 PM   Specimen: Nasal Mucosa; Nasal Swab  Result Value Ref Range Status   MRSA by PCR Next Gen NOT DETECTED NOT DETECTED Final    Comment: (NOTE) The GeneXpert MRSA Assay (FDA approved for NASAL specimens only), is one component of a comprehensive MRSA colonization surveillance program. It is not intended to diagnose MRSA infection nor to guide or monitor treatment for MRSA infections. Test performance is not FDA approved in patients less than 72 years old. Performed at [facility], [address] [place], [place] [address]   Urine Culture (for pregnant, neutropenic or urologic patients or patients with an indwelling urinary catheter)     Status: Abnormal   Collection Time: 03/22/23 10:26 AM   Specimen: Urine, Catheterized  Result Value Ref Range Status   Specimen Description   Final    URINE, CATHETERIZED Performed at [facility], [address], [place], [place] [address]    Special Requests   Final    NONE Performed at [facility], [address] [place], [place] [address]    Culture (A)  Final    60,000 COLONIES/mL ESCHERICHIA COLI Confirmed Extended Spectrum Beta-Lactamase Producer (ESBL).  In bloodstream infections from ESBL organisms, carbapenems are preferred over piperacillin/tazobactam. They are shown to have a lower risk of mortality.    Report Status 03/25/2023 FINAL  Final   Organism ID, Bacteria ESCHERICHIA COLI (A)  Final      Susceptibility   Escherichia coli - MIC*    AMPICILLIN >=32 RESISTANT Resistant     CEFAZOLIN >=64 RESISTANT Resistant     CEFEPIME >=32 RESISTANT Resistant     CEFTRIAXONE >=64 RESISTANT Resistant     CIPROFLOXACIN >=4 RESISTANT Resistant     GENTAMICIN >=16 RESISTANT Resistant     IMIPENEM  <=0.25 SENSITIVE Sensitive     NITROFURANTOIN <=16 SENSITIVE Sensitive     TRIMETH/SULFA >=320 RESISTANT Resistant     AMPICILLIN/SULBACTAM 16 INTERMEDIATE Intermediate     PIP/TAZO <=4 SENSITIVE Sensitive ug/mL    * 60,000 COLONIES/mL ESCHERICHIA COLI  Culture, blood (Routine X 2) w Reflex to ID Panel     Status: None   Collection Time: 03/22/23  9:16 PM   Specimen: BLOOD LEFT ARM  Result Value Ref Range Status   Specimen Description   Final    BLOOD LEFT ARM Performed at [facility], [address], [place], [place] [address]    Special Requests   Final    BOTTLES  DRAWN AEROBIC ONLY Blood Culture results may not be optimal due to an inadequate volume of blood received in culture bottles Performed at [facility], [address] [place], [place] [address]    Culture   Final    NO GROWTH 5 DAYS Performed at [facility], [address], [place], [place] [address]    Report Status 03/27/2023 FINAL  Final  Culture, blood (Routine X 2) w Reflex to ID Panel     Status: None   Collection Time: 03/22/23  9:16 PM   Specimen: BLOOD RIGHT HAND  Result Value Ref Range Status   Specimen Description   Final    BLOOD RIGHT HAND Performed at [facility], [address] [place], [place] [address]    Special Requests   Final    BOTTLES DRAWN AEROBIC ONLY Blood Culture results may not be optimal due to an inadequate volume of blood received in culture bottles Performed at [facility], [address] [place], [place] [address]    Culture   Final    NO GROWTH 5 DAYS Performed at [facility], [address] [place], [place] [address]    Report Status 03/27/2023 FINAL  Final   Labs: CBC: Recent Labs  Lab 04/10/23 2018 04/11/23 0615 04/12/23 0522 04/13/23 0510 04/14/23 0542 04/15/23 0457 04/16/23 0716  WBC 4.0 4.6 3.4* 4.9 4.3 5.2 5.5  NEUTROABS 2.1 1.9  --   --  1.6* 2.1 2.2  HGB 9.1* 9.1*  7.5* 8.4* 7.6* 8.1* 8.8*  HCT 28.2* 27.8* 24.9* 26.2* 24.4* 26.1* 29.5*  MCV 92.8 93.0 95.8 94.9 95.3 96.7 98.3  PLT 613* 581* 499* 514* 417* 377 415*   Basic Metabolic Panel: Recent Labs  Lab 04/12/23 0522 04/12/23 1625 04/13/23 0510 04/13/23 1717 04/14/23 0547 04/15/23 0457 04/16/23 0716  NA 134*  --  135  --  135 134* 136  K 3.5  --  3.6  --  3.8 4.0 4.2  CL 103  --  105  --  106 106 106  CO2 23  --  21*  --  21* 20* 22  GLUCOSE 195*  --  101*  --  95 100* 96  BUN 14  --  13  --  15 18 20   CREATININE 0.65  --  0.70  --  0.64 0.63 0.58  CALCIUM 8.1*  --  8.1*  --  8.0* 8.3* 8.6*  MG 1.6* 2.7* 2.2 2.1 1.8 1.8 1.6*  PHOS 3.9 3.9 3.9 3.5 3.3  --  3.2   Liver Function Tests: Recent Labs  Lab 04/10/23 2018 04/11/23 0615 04/16/23 0716  AST 46* 50* 46*  ALT 29 28 27   ALKPHOS 181* 169* 121  BILITOT 0.3 0.6 0.5  PROT 6.3* 6.6 6.2*  ALBUMIN 1.8* 2.0* 2.0*   CBG: Recent Labs  Lab 04/16/23 2353 04/17/23 0433 04/17/23 0736 04/17/23 1155 04/17/23 1620  GLUCAP 100* 90 76 93 83   Discharge time spent: greater than 30 minutes.  Signed: Marguerita Merles, DO [facility] 04/17/2023

## 2023-04-14 NOTE — Plan of Care (Signed)
  Problem: Clinical Measurements: Goal: Diagnostic test results will improve Outcome: Progressing Goal: Cardiovascular complication will be avoided Outcome: Progressing   Problem: Elimination: Goal: Will not experience complications related to urinary retention Outcome: Progressing   Problem: Safety: Goal: Ability to remain free from injury will improve Outcome: Progressing   Problem: Skin Integrity: Goal: Risk for impaired skin integrity will decrease Outcome: Progressing

## 2023-04-14 NOTE — Progress Notes (Signed)
   04/14/23 2015  BiPAP/CPAP/SIPAP  BiPAP/CPAP/SIPAP Pt Type Adult  Reason BIPAP/CPAP not in use Non-compliant

## 2023-04-14 NOTE — Progress Notes (Signed)
Mobility Specialist - Progress Note   04/14/23 1337  Mobility  Activity Ambulated with assistance in room  Level of Assistance Contact guard assist, steadying assist  Assistive Device Front wheel walker  Distance Ambulated (ft) 10 ft  Activity Response Tolerated well  Mobility visit 1 Mobility  Mobility Specialist Start Time (ACUTE ONLY) 1300  Mobility Specialist Stop Time (ACUTE ONLY) 1335  Mobility Specialist Time Calculation (min) (ACUTE ONLY) 35 min   Pt agreeable to mobilize, completed 3 sit to stands prior to ambulating in room. Pt reported feeling dizzy and returned to bed shortly after ambulating. Call light was left within reach and all needs met at [facility].   Arliss Journey Mobility Specialist Acute Rehabilitation Services Phone: [phone] 04/14/23, 1:39 PM

## 2023-04-14 NOTE — TOC Progression Note (Signed)
Transition of Care [facility]) - Progression Note    Patient Details  Name: Christine [name] MRN: [id number] Date of Birth: Oct 23, 1950  [facility] CM/SW Contact  Yetunde Leis, Olegario Messier, RN Phone Number: 04/14/2023, 2:02 PM  Clinical Narrative: Sherron Monday to patient chose [facility]rep [facility] has initiated auth d/t TOC unable to input the provider of facility into [place] in [place]. Await auth      Expected Discharge Plan: Skilled Nursing Facility Barriers to Discharge: Insurance Authorization  Expected Discharge Plan and Services                                               Social Determinants of Health (SDOH) Interventions SDOH Screenings   Food Insecurity: No Food Insecurity (04/11/2023)  Housing: Low Risk  (04/11/2023)  Transportation Needs: No Transportation Needs (04/11/2023)  Utilities: Not At Risk (04/11/2023)  Alcohol Screen: Low Risk  (07/13/2022)  Depression (PHQ2-9): Low Risk  (01/21/2022)  Financial Resource Strain: Low Risk  (12/01/2022)   Received from [facility]  Physical Activity: Insufficiently Active (07/13/2022)  Social Connections: Moderately Integrated (12/01/2022)   Received from [facility]  Stress: No Stress Concern Present (07/13/2022)  Tobacco Use: Medium Risk (04/10/2023)  Health Literacy: Medium Risk (12/01/2022)   Received from [facility]    Readmission Risk Interventions    03/23/2023   10:43 AM 01/09/2023   11:11 AM  Readmission Risk Prevention Plan  Transportation Screening Complete Complete  HRI or Home Care Consult  Complete  Social Work Consult for Recovery Care Planning/Counseling  Complete  Palliative Care Screening  Not Applicable  Medication Review Oceanographer) Complete Complete  PCP or Specialist appointment within 3-5 days of discharge Complete   HRI or Home Care Consult Complete   SW Recovery Care/Counseling Consult Complete    Palliative Care Screening Not Applicable   Skilled Nursing Facility Complete

## 2023-04-14 NOTE — Progress Notes (Signed)
PROGRESS NOTE    Christine [name]  UEA:[id number] DOB: 05/23/1950 DOA: 04/10/2023 PCP: Pcp, No   Brief Narrative:  Brief Narrative:   72 year old with history of HTN, HLD, OSA on CPAP, GERD, anxiety, morbid obesity, esophageal dysmotility with gastric bypass 2024, failure to thrive with G-tube placement, sacral wound, ESBL UTI recently admitted from 11/25 - 12/11 for perforated ulcer at gastrojejunal anastomosis with anterior side anastomosis repaired with [name] patch and discharged to SNF.  Over past 3 days she has developed more abdominal pain located at the tube insertion site.  CT abdomen pelvis shows percutaneous G-tube with balloon in the subcutaneous fat.  Concerning of filling defect in the left superficial femoral vein concerning for DVT which is confirmed on the ultrasound. IR replaced G-tube on 12/16.  Transition heparin drip to p.o. Eliquis.  Awaiting SNF placement  Assessment & Plan:  Active Problems:   GAD (generalized anxiety disorder)   Gout   Failure to thrive in adult   Dislodged gastrostomy tube    Dislodged gastrostomy tube status post recent perforated marginal ulcer DVT in right femoral -IR replaced G-tube on 12/16, dietitian following.  Slowly uptitrate over time oral intake and down the road tube feeds can be potentially discontinued.  -Now on Eliquis (new med) -CT scan shows concerning for possible arterial clot but on the ultrasound it confirms the DVT.  Discussed with radiology, this is a typo in terms of arterial clot.  Indeed this is venous clot -C/w Pain control with Acteaminophen 650 mg po q6hprn Mild Pain, Tramadol 50 mg po q6hprn Moderate Pain, and Oxycodone IR 5 mg po q4hprn Severe Pain and C/w IV Hydromorphone 0.5 mg q4hprn Severe Pain  General Anxiety Disorder -Apparently Cymbalta was stopped at SNF facility between recent admission and this admission.  -Confirmed by pharmacy team -Continue to Monitor closely   Metabolic Acidosis -Has a  slight metabolic acidosis with a CO2 of 21, anion gap of 8, chloride level 106 -Continue to Monitor and Trend and Repeat CMP in the AM  Normocytic Anemia -Hemoglobin around baseline of 9. -C/w Ferrous Sulfate 325 mg po Daily  -Hgb/Hct Trend: Recent Labs  Lab 03/29/23 0812 03/30/23 0437 04/10/23 2018 04/11/23 0615 04/12/23 0522 04/13/23 0510 04/14/23 0542  HGB 9.4* 8.9* 9.1* 9.1* 7.5* 8.4* 7.6*  HCT 29.1* 27.7* 28.2* 27.8* 24.9* 26.2* 24.4*  MCV 91.5 91.7 92.8 93.0 95.8 94.9 95.3  -Check Anemia Panel in the outpatient setting -Continue to Monitor for S/Sx of Bleeding; No overt bleeding noted -Repeat CBC within 1 week    Gout -Not taking Colchicine 1.2 mg po Daily anymore  GERD/Gastritis/GI Prophylaxis -Continue PPI with Pantoprazole 40 mg po Daily and Sucralfate 1 gram po TIDwm and qHS  Hypoalbuminemia -Patient's Albumin Trend: Recent Labs  Lab 03/21/23 0715 03/23/23 0335 03/26/23 0338 03/27/23 0242 04/10/23 2018 04/11/23 0615  ALBUMIN 2.7* 2.4* 1.9* 1.7* 1.8* 2.0*  -Continue to Monitor and Trend and repeat CMP in the AM  Nutrition Status: Nutrition Problem: Altered nutrition lab value Etiology: chronic illness (gastric bypass with dumping syndrome) Signs/Symptoms: other (comment) (other (comment) hypoglycemia with need to start tube feeds) Interventions: Refer to RD note for recommendations, MVI, Tube feeding  Class III Morbid Obesity -Complicates overall prognosis and care -Estimated body mass index is 41.53 kg/m as calculated from the following:   Height as of this encounter: 5\' 1"  (1.549 m).   Weight as of this encounter: 99.7 kg.  -Weight Loss and Dietary Counseling givenDVT prophylaxis:  apixaban (ELIQUIS)  tablet 10 mg  apixaban (ELIQUIS) tablet 5 mg    Code Status: Limited: Do not attempt resuscitation (DNR) -DNR-LIMITED -Do Not Intubate/DNI  Family Communication: No family present at bedside   Disposition Plan:  Level of care: Telemetry Status is:  Inpatient Remains inpatient appropriate because: Appears stable for D/C but needs Insurance Authorization and Bed   Consultants:  Interventional Radiology  Procedures:  As delineated as above  Antimicrobials:  Anti-infectives (From admission, onward)    None       Subjective: Seen and examined at bedside and doing fairly well.  Having some pain at her PEG tube insertion site.  No nausea or vomiting.  Denies any other concerns or complaints at this time.  Objective: Vitals:   04/13/23 2011 04/14/23 0338 04/14/23 0342 04/14/23 1247  BP: (!) 114/93  (!) 149/71 (!) 142/76  Pulse: (!) 106  (!) 103 91  Resp: 17  16 20   Temp: 98.3 F (36.8 C)  98.6 F (37 C) 97.9 F (36.6 C)  TempSrc: Oral  Oral Oral  SpO2: 98%  100% 99%  Weight:  99.7 kg    Height:        Intake/Output Summary (Last 24 hours) at 04/14/2023 1427 Last data filed at 04/14/2023 0356 Gross per 24 hour  Intake 357 ml  Output 650 ml  Net -293 ml   Filed Weights   04/11/23 0803 04/13/23 0500 04/14/23 0338  Weight: 105.9 kg 99.9 kg 99.7 kg   Examination: Physical Exam:  Constitutional: WN/WD morbidly obese female in no acute distress appears calm Respiratory: Diminished to auscultation bilaterally, no wheezing, rales, rhonchi or crackles. Normal respiratory effort and patient is not tachypenic. No accessory muscle use.  Unlabored breathing Cardiovascular: RRR, no murmurs / rubs / gallops. S1 and S2 auscultated. No extremity edema.   Abdomen: Soft, non-tender, distended 2/2 body habitus. PEG Tube in place. Bowel sounds positive.  GU: Deferred. Musculoskeletal: No clubbing / cyanosis of digits/nails. No joint deformity upper and lower extremities.  Skin: No rashes, lesions, ulcers on a limited skin evaluation. No induration; Warm and dry.  Neurologic: CN 2-12 grossly intact with no focal deficits. Romberg sign and cerebellar reflexes not assessed.  Psychiatric: Normal judgment and insight. Alert and oriented x  3. Normal mood and appropriate affect.   Data Reviewed: I have personally reviewed following labs and imaging studies  CBC: Recent Labs  Lab 04/10/23 2018 04/11/23 0615 04/12/23 0522 04/13/23 0510 04/14/23 0542  WBC 4.0 4.6 3.4* 4.9 4.3  NEUTROABS 2.1 1.9  --   --  1.6*  HGB 9.1* 9.1* 7.5* 8.4* 7.6*  HCT 28.2* 27.8* 24.9* 26.2* 24.4*  MCV 92.8 93.0 95.8 94.9 95.3  PLT 613* 581* 499* 514* 417*   Basic Metabolic Panel: Recent Labs  Lab 04/10/23 2018 04/11/23 0615 04/12/23 0522 04/12/23 1625 04/13/23 0510 04/13/23 1717 04/14/23 0547  NA 133* 133* 134*  --  135  --  135  K 3.6 3.9 3.5  --  3.6  --  3.8  CL 103 103 103  --  105  --  106  CO2 23 24 23   --  21*  --  21*  GLUCOSE 105* 80 195*  --  101*  --  95  BUN 9 10 14   --  13  --  15  CREATININE 0.53 0.61 0.65  --  0.70  --  0.64  CALCIUM 8.3* 8.3* 8.1*  --  8.1*  --  8.0*  MG  --   --  1.6* 2.7* 2.2 2.1 1.8  PHOS  --   --  3.9 3.9 3.9 3.5 3.3   GFR: Estimated Creatinine Clearance: 70.9 mL/min (by C-G formula based on SCr of 0.64 mg/dL). Liver Function Tests: Recent Labs  Lab 04/10/23 2018 04/11/23 0615  AST 46* 50*  ALT 29 28  ALKPHOS 181* 169*  BILITOT 0.3 0.6  PROT 6.3* 6.6  ALBUMIN 1.8* 2.0*   Recent Labs  Lab 04/10/23 2018  LIPASE 28   No results for input(s): "AMMONIA" in the last 168 hours. Coagulation Profile: No results for input(s): "INR", "PROTIME" in the last 168 hours. Cardiac Enzymes: No results for input(s): "CKTOTAL", "CKMB", "CKMBINDEX", "TROPONINI" in the last 168 hours. BNP (last 3 results) No results for input(s): "PROBNP" in the last 8760 hours. HbA1C: No results for input(s): "HGBA1C" in the last 72 hours. CBG: Recent Labs  Lab 04/13/23 2012 04/14/23 0008 04/14/23 0345 04/14/23 0723 04/14/23 1105  GLUCAP 105* 100* 93 87 89   Lipid Profile: No results for input(s): "CHOL", "HDL", "LDLCALC", "TRIG", "CHOLHDL", "LDLDIRECT" in the last 72 hours. Thyroid Function Tests: No  results for input(s): "TSH", "T4TOTAL", "FREET4", "T3FREE", "THYROIDAB" in the last 72 hours. Anemia Panel: No results for input(s): "VITAMINB12", "FOLATE", "FERRITIN", "TIBC", "IRON", "RETICCTPCT" in the last 72 hours. Sepsis Labs: No results for input(s): "PROCALCITON", "LATICACIDVEN" in the last 168 hours.  No results found for this or any previous visit (from the past 240 hours).   Radiology Studies: No results found.  Scheduled Meds:  apixaban  10 mg Oral BID   Followed by   Melene Muller ON 04/19/2023] apixaban  5 mg Oral BID   ascorbic acid  500 mg Oral Daily   cholecalciferol  5,000 Units Oral Daily   copper  2 mg Oral Daily   feeding supplement (PROSource TF20)  60 mL Per Tube Daily   ferrous sulfate  325 mg Oral Q breakfast   multivitamin with minerals  1 tablet Oral Daily   pantoprazole  40 mg Oral Daily   sucralfate  1 g Oral TID WC & HS   Continuous Infusions:  feeding supplement (JEVITY 1.2 CAL) 1,000 mL (04/13/23 1621)    LOS: 3 days   Marguerita Merles, DO [facility] Available via Epic secure chat 7am-7pm After these hours, please refer to coverage provider listed on [email or web address] 04/14/2023, 2:27 PM

## 2023-04-15 ENCOUNTER — Inpatient Hospital Stay (HOSPITAL_COMMUNITY): Payer: Medicare HMO

## 2023-04-15 DIAGNOSIS — M109 Gout, unspecified: Secondary | ICD-10-CM | POA: Diagnosis not present

## 2023-04-15 DIAGNOSIS — F411 Generalized anxiety disorder: Secondary | ICD-10-CM | POA: Diagnosis not present

## 2023-04-15 DIAGNOSIS — R627 Adult failure to thrive: Secondary | ICD-10-CM | POA: Diagnosis not present

## 2023-04-15 DIAGNOSIS — T85528A Displacement of other gastrointestinal prosthetic devices, implants and grafts, initial encounter: Secondary | ICD-10-CM | POA: Diagnosis not present

## 2023-04-15 LAB — CBC WITH DIFFERENTIAL/PLATELET
Abs Immature Granulocytes: 0.04 10*3/uL (ref 0.00–0.07)
Basophils Absolute: 0 10*3/uL (ref 0.0–0.1)
Basophils Relative: 1 %
Eosinophils Absolute: 0.3 10*3/uL (ref 0.0–0.5)
Eosinophils Relative: 5 %
HCT: 26.1 % — ABNORMAL LOW (ref 36.0–46.0)
Hemoglobin: 8.1 g/dL — ABNORMAL LOW (ref 12.0–15.0)
Immature Granulocytes: 1 %
Lymphocytes Relative: 40 %
Lymphs Abs: 2.1 10*3/uL (ref 0.7–4.0)
MCH: 30 pg (ref 26.0–34.0)
MCHC: 31 g/dL (ref 30.0–36.0)
MCV: 96.7 fL (ref 80.0–100.0)
Monocytes Absolute: 0.6 10*3/uL (ref 0.1–1.0)
Monocytes Relative: 12 %
Neutro Abs: 2.1 10*3/uL (ref 1.7–7.7)
Neutrophils Relative %: 41 %
Platelets: 377 10*3/uL (ref 150–400)
RBC: 2.7 MIL/uL — ABNORMAL LOW (ref 3.87–5.11)
RDW: 18.4 % — ABNORMAL HIGH (ref 11.5–15.5)
WBC: 5.2 10*3/uL (ref 4.0–10.5)
nRBC: 0 % (ref 0.0–0.2)

## 2023-04-15 LAB — BASIC METABOLIC PANEL
Anion gap: 8 (ref 5–15)
BUN: 18 mg/dL (ref 8–23)
CO2: 20 mmol/L — ABNORMAL LOW (ref 22–32)
Calcium: 8.3 mg/dL — ABNORMAL LOW (ref 8.9–10.3)
Chloride: 106 mmol/L (ref 98–111)
Creatinine, Ser: 0.63 mg/dL (ref 0.44–1.00)
GFR, Estimated: >60 mL/min (ref >60)
Glucose, Bld: 100 mg/dL — ABNORMAL HIGH (ref 70–99)
Potassium: 4 mmol/L (ref 3.5–5.1)
Sodium: 134 mmol/L — ABNORMAL LOW (ref 135–145)

## 2023-04-15 LAB — GLUCOSE, CAPILLARY
Glucose-Capillary: 102 mg/dL — ABNORMAL HIGH (ref 70–99)
Glucose-Capillary: 111 mg/dL — ABNORMAL HIGH (ref 70–99)
Glucose-Capillary: 90 mg/dL (ref 70–99)
Glucose-Capillary: 93 mg/dL (ref 70–99)
Glucose-Capillary: 94 mg/dL (ref 70–99)
Glucose-Capillary: 98 mg/dL (ref 70–99)

## 2023-04-15 LAB — MAGNESIUM: Magnesium: 1.8 mg/dL (ref 1.7–2.4)

## 2023-04-15 MED ORDER — ADULT MULTIVITAMIN W/MINERALS CH
1.0000 | ORAL_TABLET | Freq: Every day | ORAL | Status: DC
  Administered 2023-04-15 – 2023-04-21 (×7): 1
  Filled 2023-04-15 (×7): qty 1

## 2023-04-15 MED ORDER — FERROUS SULFATE 220 (44 FE) MG/5ML PO SOLN
220.0000 mg | Freq: Every day | ORAL | Status: DC
  Filled 2023-04-15: qty 5

## 2023-04-15 MED ORDER — APIXABAN 5 MG PO TABS
10.0000 mg | ORAL_TABLET | Freq: Two times a day (BID) | ORAL | Status: AC
  Administered 2023-04-15 – 2023-04-18 (×8): 10 mg
  Filled 2023-04-15 (×7): qty 2

## 2023-04-15 MED ORDER — SENNOSIDES-DOCUSATE SODIUM 8.6-50 MG PO TABS: 1.0000 | ORAL_TABLET | Freq: Every evening | ORAL | Status: DC | PRN

## 2023-04-15 MED ORDER — VITAMIN C 500 MG PO TABS
500.0000 mg | ORAL_TABLET | Freq: Every day | ORAL | Status: DC
Start: 2023-04-15 — End: 2023-04-22
  Administered 2023-04-15 – 2023-04-21 (×7): 500 mg
  Filled 2023-04-15 (×7): qty 1

## 2023-04-15 MED ORDER — PANTOPRAZOLE SODIUM 40 MG IV SOLR
40.0000 mg | Freq: Every day | INTRAVENOUS | Status: DC
  Administered 2023-04-15 – 2023-04-21 (×7): 40 mg via INTRAVENOUS
  Filled 2023-04-15 (×7): qty 10

## 2023-04-15 MED ORDER — TRAMADOL HCL 50 MG PO TABS
50.0000 mg | ORAL_TABLET | Freq: Four times a day (QID) | ORAL | Status: DC | PRN
  Administered 2023-04-16 – 2023-04-18 (×4): 50 mg
  Filled 2023-04-15 (×5): qty 1

## 2023-04-15 MED ORDER — COPPER 2 MG PO TABS
2.0000 mg | Freq: Every day | Status: DC
  Administered 2023-04-15 – 2023-04-21 (×7): 2 mg
  Filled 2023-04-15 (×8): qty 1

## 2023-04-15 MED ORDER — ONDANSETRON HCL 4 MG PO TABS: 4.0000 mg | ORAL_TABLET | Freq: Four times a day (QID) | ORAL | Status: DC | PRN

## 2023-04-15 MED ORDER — ACETAMINOPHEN 160 MG/5ML PO SOLN: 650.0000 mg | Freq: Four times a day (QID) | ORAL | Status: DC | PRN

## 2023-04-15 MED ORDER — ACETAMINOPHEN 650 MG RE SUPP: 650.0000 mg | Freq: Four times a day (QID) | RECTAL | Status: DC | PRN

## 2023-04-15 MED ORDER — SUCRALFATE 1 GM/10ML PO SUSP
1.0000 g | Freq: Three times a day (TID) | ORAL | Status: DC
  Administered 2023-04-15 – 2023-04-22 (×29): 1 g
  Filled 2023-04-15 (×29): qty 10

## 2023-04-15 MED ORDER — APIXABAN 5 MG PO TABS
5.0000 mg | ORAL_TABLET | Freq: Two times a day (BID) | ORAL | Status: DC
  Administered 2023-04-19 – 2023-04-22 (×7): 5 mg
  Filled 2023-04-15 (×7): qty 1

## 2023-04-15 MED ORDER — GUAIFENESIN 100 MG/5ML PO LIQD: 5.0000 mL | ORAL | Status: DC | PRN

## 2023-04-15 MED ORDER — VITAMIN D 25 MCG (1000 UNIT) PO TABS
5000.0000 [IU] | ORAL_TABLET | Freq: Every day | ORAL | Status: DC
  Administered 2023-04-15 – 2023-04-21 (×7): 5000 [IU]
  Filled 2023-04-15 (×7): qty 5

## 2023-04-15 MED ORDER — OXYCODONE HCL 5 MG PO TABS: 5.0000 mg | ORAL_TABLET | ORAL | Status: DC | PRN

## 2023-04-15 NOTE — Progress Notes (Signed)
Patient ID: Christine [name], female   DOB: July 07, 1950, 72 y.o.   MRN: [id number] Patient seen today for questions regarding leaking at G-tube site.  Per patient the leaking has diminished over past 24 hours and following discussion with Dr. Grace Isaac she has made decision not to pursue any further intervention at this time.

## 2023-04-15 NOTE — Progress Notes (Signed)
PT Cancellation Note  Patient Details Name: Christine [name] MRN: [id number] DOB: 22-Aug-1950   Cancelled Treatment:     Therapist in this pm, pt refused any activity due to still eating lunch, requesting NT for hygiene, and potential d/c to SNF today.    Jannet Askew 04/15/2023, 4:17 PM

## 2023-04-15 NOTE — Plan of Care (Signed)
  Problem: Education: Goal: Knowledge of General Education information will improve Description: Including pain rating scale, medication(s)/side effects and non-pharmacologic comfort measures Outcome: Progressing   Problem: Clinical Measurements: Goal: Will remain free from infection Outcome: Progressing   Problem: Activity: Goal: Risk for activity intolerance will decrease Outcome: Progressing   Problem: Nutrition: Goal: Adequate nutrition will be maintained Outcome: Progressing   Problem: Coping: Goal: Level of anxiety will decrease Outcome: Progressing   Problem: Pain Management: Goal: General experience of comfort will improve Outcome: Progressing

## 2023-04-15 NOTE — Progress Notes (Signed)
   04/15/23 2248  BiPAP/CPAP/SIPAP  BiPAP/CPAP/SIPAP Pt Type Adult  Reason BIPAP/CPAP not in use Non-compliant

## 2023-04-15 NOTE — Progress Notes (Signed)
Wet to dry dressing completed per order

## 2023-04-15 NOTE — TOC Progression Note (Signed)
Transition of Care [facility]) - Progression Note    Patient Details  Name: Christine [name] MRN: [id number] Date of Birth: March 10, 1951  Transition of Care [facility]) CM/SW Contact  Silas Muff, Olegario Messier, RN Phone Number: 04/15/2023, 10:56 AM  Clinical Narrative:  still pending auth for ST SNF @ [facility]) @ the [facility] in [place] rep has initiated [place].     Expected Discharge Plan: Skilled Nursing Facility Barriers to Discharge: Insurance Authorization  Expected Discharge Plan and Services                                               Social Determinants of Health (SDOH) Interventions SDOH Screenings   Food Insecurity: No Food Insecurity (04/11/2023)  Housing: Low Risk  (04/11/2023)  Transportation Needs: No Transportation Needs (04/11/2023)  Utilities: Not At Risk (04/11/2023)  Alcohol Screen: Low Risk  (07/13/2022)  Depression (PHQ2-9): Low Risk  (01/21/2022)  Financial Resource Strain: Low Risk  (12/01/2022)   Received from [facility]  Physical Activity: Insufficiently Active (07/13/2022)  Social Connections: Moderately Integrated (12/01/2022)   Received from [facility]  Stress: No Stress Concern Present (07/13/2022)  Tobacco Use: Medium Risk (04/10/2023)  Health Literacy: Medium Risk (12/01/2022)   [facility]    Readmission Risk Interventions    03/23/2023   10:43 AM 01/09/2023   11:11 AM  Readmission Risk Prevention Plan  Transportation Screening Complete Complete  HRI or Home Care Consult  Complete  Social Work Consult for Recovery Care Planning/Counseling  Complete  Palliative Care Screening  Not Applicable  Medication Review Oceanographer) Complete Complete  PCP or Specialist appointment within 3-5 days of discharge Complete   HRI or Home Care Consult Complete   SW Recovery Care/Counseling Consult Complete   Palliative Care Screening Not Applicable   Skilled Nursing Facility  Complete

## 2023-04-15 NOTE — Progress Notes (Signed)
PROGRESS NOTE    Christine [name]  ZOX:[id number] DOB: [birth year] DOA: 04/10/2023 PCP: Pcp, No   Brief Narrative:  The patient is a 72 year old with history of HTN, HLD, OSA on CPAP, GERD, anxiety, morbid obesity, esophageal dysmotility with gastric bypass 2024, failure to thrive with G-tube placement, sacral wound, ESBL UTI recently admitted from 11/25 - 12/11 for perforated ulcer at gastrojejunal anastomosis with anterior side anastomosis repaired with [name] patch and discharged to SNF.  Over past 3 days she has developed more abdominal pain located at the tube insertion site.  CT abdomen pelvis shows percutaneous G-tube with balloon in the subcutaneous fat.  Concerning of filling defect in the left superficial femoral vein concerning for DVT which is confirmed on the ultrasound. IR replaced G-tube on 12/16.  Transition heparin drip to p.o. Eliquis.  Awaiting SNF placement  **12/19-12/20 had some issues with he G tube leaking so IR was re-consulted.  Subsequently the leaking has diminished and following discussion with interventional radiologist the patient made a decision not to pursue any further intervention at this time.  Assessment & Plan:  Active Problems:   GAD (generalized anxiety disorder)   Gout   Failure to thrive in adult   Dislodged gastrostomy tube    Dislodged gastrostomy tube status post recent perforated marginal ulcer DVT in right femoral -IR replaced G-tube on 12/16, dietitian following.  Slowly uptitrate over time oral intake and down the road tube feeds can be potentially discontinued.  -Now on Eliquis (new med) -CT scan shows concerning for possible arterial clot but on the ultrasound it confirms the DVT.  Discussed with radiology, this is a typo in terms of arterial clot.  Indeed this is venous clot -C/w Pain control with Acteaminophen 650 mg po q6hprn Mild Pain, Tramadol 50 mg po q6hprn Moderate Pain, and Oxycodone IR 5 mg po q4hprn Severe Pain and C/w IV  Hydromorphone 0.5 mg q4hprn Severe Pain -She is medically stable for discharge at this time  General Anxiety Disorder -Apparently Cymbalta was stopped at SNF facility between recent admission and this admission.  -Confirmed by pharmacy team -Continue to Monitor closely   Metabolic Acidosis -Has a slight metabolic acidosis with a CO2 of 20, anion gap of 8, chloride level 106 -Continue to Monitor and Trend and Repeat CMP in the AM  Normocytic Anemia -Hemoglobin around baseline of 9. -C/w Ferrous Sulfate 325 mg po Daily  -Hgb/Hct Trend: Recent Labs  Lab 03/29/23 0812 03/30/23 0437 04/10/23 2018 04/11/23 0615 04/12/23 0522 04/13/23 0510 04/14/23 0542  HGB 9.4* 8.9* 9.1* 9.1* 7.5* 8.4* 7.6*  HCT 29.1* 27.7* 28.2* 27.8* 24.9* 26.2* 24.4*  MCV 91.5 91.7 92.8 93.0 95.8 94.9 95.3  -Check Anemia Panel in the a.m. as insurance authorization is still pending -Continue to Monitor for S/Sx of Bleeding; No overt bleeding noted -Repeat CBC within 1 week    Gout -Not taking Colchicine 1.2 mg po Daily anymore  GERD/Gastritis/GI Prophylaxis -Continue PPI with Pantoprazole 40 mg po Daily and Sucralfate 1 gram po TIDwm and qHS  Hypoalbuminemia -Patient's Albumin Trend: Recent Labs  Lab 03/21/23 0715 03/23/23 0335 03/26/23 0338 03/27/23 0242 04/10/23 2018 04/11/23 0615  ALBUMIN 2.7* 2.4* 1.9* 1.7* 1.8* 2.0*  -Continue to Monitor and Trend and repeat CMP in the AM  Nutrition Status: Nutrition Problem: Altered nutrition lab value Etiology: 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   Weight as of this encounter: 99.7 kg.   -Weight Loss and Dietary Counseling given   DVT prophylaxis:  apixaban (ELIQUIS) tablet 10 mg  apixaban (ELIQUIS) tablet 5 mg    Code Status: Limited: Do not attempt resuscitation (DNR) -DNR-LIMITED -Do Not Intubate/DNI  Family Communication: No family currently at bedside  Disposition Plan:  Level of care: Telemetry Status is: Inpatient Remains inpatient appropriate because: She is medically stable for discharge but needs insurance authorization for SNF discharge and bed availability   Consultants:  Interventional Radiology  Procedures:  As delineated as above  Antimicrobials:  Anti-infectives (From admission, onward)    None       Subjective: Seen and examined at bedside and was doing okay and states that tramadol has helped.  Had some leaking around her G-tube yesterday so IR was reconsulted and they evaluated but they can diminished in the last 24 hours so patient decided not to pursue any further intervention.  No nausea or vomiting.  Feels well and is medically stable for discharge at this time.  Objective: Vitals:   04/14/23 2016 04/15/23 0357 04/15/23 0500 04/15/23 1241  BP: 125/66 (!) 151/68  (!) 157/69  Pulse: (!) 106 92  91  Resp: 18 16  20   Temp: 98.8 F (37.1 C) 98.2 F (36.8 C)  98.9 F (37.2 C)  TempSrc: Oral Oral  Oral  SpO2: 98% 99%  98%  Weight:   98.6 kg   Height:        Intake/Output Summary (Last 24 hours) at 04/15/2023 1255 Last data filed at 04/15/2023 0600 Gross per 24 hour  Intake 740 ml  Output 750 ml  Net -10 ml   Filed Weights   04/13/23 0500 04/14/23 0338 04/15/23 0500  Weight: 99.9 kg 99.7 kg 98.6 kg   Examination: Physical Exam:  Constitutional: WN/WD morbidly obese female in no acute distress appears calm Respiratory: Diminished to auscultation bilaterally, no wheezing, rales, rhonchi or crackles. Normal respiratory effort and patient is not tachypenic. No accessory muscle use.  Unlabored breathing Cardiovascular: RRR, no  murmurs / rubs / gallops. S1 and S2 auscultated. No extremity edema.   Abdomen: Soft, non-tender, distended secondary body habitus and PEG tube is in place. Bowel sounds positive.  GU: Deferred. Musculoskeletal: No clubbing / cyanosis of digits/nails. No joint deformity upper and lower extremities.  Skin: No rashes, lesions, ulcers on limited skin evaluation. No induration; Warm and dry.  Neurologic: CN 2-12 grossly intact with no focal deficits. Romberg sign and cerebellar reflexes not assessed.  Psychiatric: Normal judgment and insight. Alert and oriented x 3. Normal mood and appropriate affect.   Data Reviewed: I have personally reviewed following labs and imaging studies  CBC: Recent Labs  Lab 04/10/23 2018 04/11/23 0615 04/12/23 0522 04/13/23 0510 04/14/23 0542 04/15/23 0457  WBC 4.0 4.6 3.4* 4.9 4.3 5.2  NEUTROABS 2.1 1.9  --   --  1.6* 2.1  HGB 9.1* 9.1* 7.5* 8.4* 7.6* 8.1*  HCT 28.2* 27.8* 24.9* 26.2* 24.4* 26.1*  MCV 92.8 93.0 95.8 94.9 95.3 96.7  PLT 613* 581* 499* 514* 417* 377   Basic Metabolic Panel: Recent Labs  Lab 04/11/23 0615 04/12/23 0522 04/12/23 0522 04/12/23 1625 04/13/23 0510 04/13/23 1717 04/14/23 0547 04/15/23 0457  NA 133* 134*  --   --  135  --  135 134*  K 3.9 3.5  --   --  3.6  --  3.8 4.0  CL 103 103  --   --  105  --  106 106  CO2 24 23  --   --  21*  --  21* 20*  GLUCOSE 80 195*  --   --  101*  --  95 100*  BUN 10 14  --   --  13  --  15 18  CREATININE 0.61 0.65  --   --  0.70  --  0.64 0.63  CALCIUM 8.3* 8.1*  --   --  8.1*  --  8.0* 8.3*  MG  --  1.6*   < > 2.7* 2.2 2.1 1.8 1.8  PHOS  --  3.9  --  3.9 3.9 3.5 3.3  --    < > = values in this interval not displayed.   GFR: Estimated Creatinine Clearance: 70.3 mL/min (by C-G formula based on SCr of 0.63 mg/dL). Liver Function Tests: Recent Labs  Lab 04/10/23 2018 04/11/23 0615  AST 46* 50*  ALT 29 28  ALKPHOS 181* 169*  BILITOT 0.3 0.6  PROT 6.3* 6.6  ALBUMIN 1.8* 2.0*    Recent Labs  Lab 04/10/23 2018  LIPASE 28   No results for input(s): "AMMONIA" in the last 168 hours. Coagulation Profile: No results for input(s): "INR", "PROTIME" in the last 168 hours. Cardiac Enzymes: No results for input(s): "CKTOTAL", "CKMB", "CKMBINDEX", "TROPONINI" in the last 168 hours. BNP (last 3 results) No results for input(s): "PROBNP" in the last 8760 hours. HbA1C: No results for input(s): "HGBA1C" in the last 72 hours. CBG: Recent Labs  Lab 04/14/23 2013 04/15/23 0005 04/15/23 0355 04/15/23 0739 04/15/23 1129  GLUCAP 94 94 93 90 111*   Lipid Profile: No results for input(s): "CHOL", "HDL", "LDLCALC", "TRIG", "CHOLHDL", "LDLDIRECT" in the last 72 hours. Thyroid Function Tests: No results for input(s): "TSH", "T4TOTAL", "FREET4", "T3FREE", "THYROIDAB" in the last 72 hours. Anemia Panel: No results for input(s): "VITAMINB12", "FOLATE", "FERRITIN", "TIBC", "IRON", "RETICCTPCT" in the last 72 hours. Sepsis Labs: No results for input(s): "PROCALCITON", "LATICACIDVEN" in the last 168 hours.  No results found for this or any previous visit (from the past 240 hours).   Radiology Studies: No results found.  Scheduled Meds:  apixaban  10 mg Per Tube BID   Followed by   Melene Muller ON 04/19/2023] apixaban  5 mg Per Tube BID   ascorbic acid  500 mg Per Tube Daily   cholecalciferol  5,000 Units Per Tube Daily   copper  2 mg Per Tube Daily   feeding supplement (PROSource TF20)  60 mL Per Tube Daily   [START ON 04/16/2023] ferrous sulfate  220 mg Per Tube Daily   multivitamin with minerals  1 tablet Per Tube Daily   pantoprazole (PROTONIX) IV  40 mg Intravenous QHS   sucralfate  1 g Per Tube TID WC & HS   Continuous Infusions:  feeding supplement (JEVITY 1.2 CAL) 1,000 mL (04/15/23 0930)    LOS: 4 days   Marguerita Merles, DO [facility] Available via Epic secure chat 7am-7pm After these hours, please refer to coverage provider listed on  [email or web address] 04/15/2023, 12:55 PM

## 2023-04-15 NOTE — Progress Notes (Signed)
PHARMACY - ANTICOAGULATION CONSULT NOTE  Pharmacy Consult for heparin --> Eliquis Indication: acute DVT  Allergies  Allergen Reactions   Prednisone Swelling    Legs swell    Tape Hives   Sulfa Antibiotics Rash    Patient Measurements: Height: 5\' 1"  (154.9 cm) Weight: 98.6 kg (217 lb 6 oz) IBW/kg (Calculated) : 47.8 Heparin Dosing Weight: 74 kg  Vital Signs: Temp: 98.2 F (36.8 C) (12/20 0357) Temp Source: Oral (12/20 0357) BP: 151/68 (12/20 0357) Pulse Rate: 92 (12/20 0357)  Labs: Recent Labs    04/13/23 0510 04/14/23 0542 04/14/23 0547 04/15/23 0457  HGB 8.4* 7.6*  --  8.1*  HCT 26.2* 24.4*  --  26.1*  PLT 514* 417*  --  377  CREATININE 0.70  --  0.64 0.63    Estimated Creatinine Clearance: 70.3 mL/min (by C-G formula based on SCr of 0.63 mg/dL).   Medical History: Past Medical History:  Diagnosis Date   Allergy    dust, pollen, sulfa, prednisone   Anemia    Anxiety    Arthritis    "knees" (04/26/2016)   Colon polyps    benign per pt   Coronary artery disease    mild per 2015 cath in [place] (OM1 30%, RCA 30%)   GERD (gastroesophageal reflux disease)    Gout    History of hiatal hernia    Hyperlipidemia 05/20/2021   Hypertension    Migraine    "none since early /2017" (05/06/2016)   Obesity    OSA on CPAP    uses CPAP   Pre-diabetes    Pre-operative cardiovascular examination 08/22/2008   Renal insufficiency 11/09/2022   Vasculitis (HCC)    Bilateral     Assessment: Patient is a 72 y.o F with G-tube who was recently hospitalized from 03/21/23 to 04/06/23 for perforated marginal ulcer. She underwent exp lap abdominal washout, Graham patch repair of perforated gastrojejunal ulcer, and intraperitoneal drain placement on 03/21/23. She presented back to the ED on 04/10/23 with c/o abdominal pain and nausea. Abdominal CT on 04/10/23 showed dislodged G-tube and findings with concern for DVT in the left common and superficial femoral arteries.  LE  doppler on 04/11/23 resulted back with acute DVT in the left common femoral vein.   She was started on heparin drip for acute VTE on 12/16 and changed to Eliquis on 12/17.  - IR replaced G-tube on 12/16  Today, 04/15/2023:  - cbc somewhat stable - scr <1  Goal of Therapy:  Heparin level 0.3-0.7 units/ml Monitor platelets by anticoagulation protocol: Yes   Plan:  - continue treatment dose of Eliquis - pharmacy will sign off for Eliquis, but will follow patient peripherally along with you.  Re-consult [place] if need further assistance.   Isak Sotomayor P 04/15/2023,9:30 AM

## 2023-04-16 DIAGNOSIS — T85528A Displacement of other gastrointestinal prosthetic devices, implants and grafts, initial encounter: Secondary | ICD-10-CM | POA: Diagnosis not present

## 2023-04-16 DIAGNOSIS — R627 Adult failure to thrive: Secondary | ICD-10-CM | POA: Diagnosis not present

## 2023-04-16 DIAGNOSIS — F411 Generalized anxiety disorder: Secondary | ICD-10-CM | POA: Diagnosis not present

## 2023-04-16 DIAGNOSIS — M109 Gout, unspecified: Secondary | ICD-10-CM | POA: Diagnosis not present

## 2023-04-16 LAB — RETICULOCYTES
Immature Retic Fract: 26.4 % — ABNORMAL HIGH (ref 2.3–15.9)
RBC.: 2.91 MIL/uL — ABNORMAL LOW (ref 3.87–5.11)
Retic Count, Absolute: 89.6 10*3/uL (ref 19.0–186.0)
Retic Ct Pct: 3.1 % (ref 0.4–3.1)

## 2023-04-16 LAB — CBC WITH DIFFERENTIAL/PLATELET
Abs Immature Granulocytes: 0.03 10*3/uL (ref 0.00–0.07)
Basophils Absolute: 0.1 10*3/uL (ref 0.0–0.1)
Basophils Relative: 1 %
Eosinophils Absolute: 0.3 10*3/uL (ref 0.0–0.5)
Eosinophils Relative: 6 %
HCT: 29.5 % — ABNORMAL LOW (ref 36.0–46.0)
Hemoglobin: 8.8 g/dL — ABNORMAL LOW (ref 12.0–15.0)
Immature Granulocytes: 1 %
Lymphocytes Relative: 43 %
Lymphs Abs: 2.4 10*3/uL (ref 0.7–4.0)
MCH: 29.3 pg (ref 26.0–34.0)
MCHC: 29.8 g/dL — ABNORMAL LOW (ref 30.0–36.0)
MCV: 98.3 fL (ref 80.0–100.0)
Monocytes Absolute: 0.5 10*3/uL (ref 0.1–1.0)
Monocytes Relative: 9 %
Neutro Abs: 2.2 10*3/uL (ref 1.7–7.7)
Neutrophils Relative %: 40 %
Platelets: 415 10*3/uL — ABNORMAL HIGH (ref 150–400)
RBC: 3 MIL/uL — ABNORMAL LOW (ref 3.87–5.11)
RDW: 18.3 % — ABNORMAL HIGH (ref 11.5–15.5)
WBC: 5.5 10*3/uL (ref 4.0–10.5)
nRBC: 0 % (ref 0.0–0.2)

## 2023-04-16 LAB — IRON AND TIBC
Iron: 45 ug/dL (ref 28–170)
Saturation Ratios: 25 % (ref 10.4–31.8)
TIBC: 179 ug/dL — ABNORMAL LOW (ref 250–450)
UIBC: 134 ug/dL

## 2023-04-16 LAB — COMPREHENSIVE METABOLIC PANEL
ALT: 27 U/L (ref 0–44)
AST: 46 U/L — ABNORMAL HIGH (ref 15–41)
Albumin: 2 g/dL — ABNORMAL LOW (ref 3.5–5.0)
Alkaline Phosphatase: 121 U/L (ref 38–126)
Anion gap: 8 (ref 5–15)
BUN: 20 mg/dL (ref 8–23)
CO2: 22 mmol/L (ref 22–32)
Calcium: 8.6 mg/dL — ABNORMAL LOW (ref 8.9–10.3)
Chloride: 106 mmol/L (ref 98–111)
Creatinine, Ser: 0.58 mg/dL (ref 0.44–1.00)
GFR, Estimated: >60 mL/min (ref >60)
Glucose, Bld: 96 mg/dL (ref 70–99)
Potassium: 4.2 mmol/L (ref 3.5–5.1)
Sodium: 136 mmol/L (ref 135–145)
Total Bilirubin: 0.5 mg/dL (ref <1.2)
Total Protein: 6.2 g/dL — ABNORMAL LOW (ref 6.5–8.1)

## 2023-04-16 LAB — PHOSPHORUS: Phosphorus: 3.2 mg/dL (ref 2.5–4.6)

## 2023-04-16 LAB — GLUCOSE, CAPILLARY
Glucose-Capillary: 100 mg/dL — ABNORMAL HIGH (ref 70–99)
Glucose-Capillary: 105 mg/dL — ABNORMAL HIGH (ref 70–99)
Glucose-Capillary: 108 mg/dL — ABNORMAL HIGH (ref 70–99)
Glucose-Capillary: 123 mg/dL — ABNORMAL HIGH (ref 70–99)
Glucose-Capillary: 82 mg/dL (ref 70–99)
Glucose-Capillary: 94 mg/dL (ref 70–99)
Glucose-Capillary: 95 mg/dL (ref 70–99)

## 2023-04-16 LAB — VITAMIN B12: Vitamin B-12: 951 pg/mL — ABNORMAL HIGH (ref 180–914)

## 2023-04-16 LAB — MAGNESIUM: Magnesium: 1.6 mg/dL — ABNORMAL LOW (ref 1.7–2.4)

## 2023-04-16 LAB — FOLATE: Folate: 10.8 ng/mL (ref >5.9)

## 2023-04-16 LAB — FERRITIN: Ferritin: 206 ng/mL (ref 11–307)

## 2023-04-16 MED ORDER — MAGNESIUM SULFATE 2 GM/50ML IV SOLN
2.0000 g | Freq: Once | INTRAVENOUS | Status: AC
  Administered 2023-04-16: 2 g via INTRAVENOUS
  Filled 2023-04-16: qty 50

## 2023-04-16 MED ORDER — FERROUS SULFATE 300 (60 FE) MG/5ML PO SOLN
300.0000 mg | Freq: Every day | ORAL | Status: DC
  Administered 2023-04-16 – 2023-04-21 (×6): 300 mg
  Filled 2023-04-16 (×7): qty 5

## 2023-04-16 NOTE — Progress Notes (Signed)
PROGRESS NOTE    Christine [name]  XBJ:[id number] DOB: 1950-11-19 DOA: 04/10/2023 PCP: Pcp, No   Brief Narrative:  The patient is a 72 year old with history of HTN, HLD, OSA on CPAP, GERD, anxiety, morbid obesity, esophageal dysmotility with gastric bypass 2024, failure to thrive with G-tube placement, sacral wound, ESBL UTI recently admitted from 11/25 - 12/11 for perforated ulcer at gastrojejunal anastomosis with anterior side anastomosis repaired with [name] patch and discharged to SNF.  Over past 3 days she has developed more abdominal pain located at the tube insertion site.  CT abdomen pelvis shows percutaneous G-tube with balloon in the subcutaneous fat.  Concerning of filling defect in the left superficial femoral vein concerning for DVT which is confirmed on the ultrasound. IR replaced G-tube on 12/16.  Transition heparin drip to p.o. Eliquis.  Awaiting SNF placement  **12/19-12/20 had some issues with he G tube leaking so IR was re-consulted.  Subsequently the leaking has diminished and following discussion with interventional radiologist the patient made a decision not to pursue any further intervention at this time.  She is medically stable for D/C to SNF but awaiting English as a second language teacher.   Assessment & Plan:  Active Problems:   GAD (generalized anxiety disorder)   Gout   Failure to thrive in adult   Dislodged gastrostomy tube   Dislodged gastrostomy tube status post recent perforated marginal ulcer DVT in right femoral -IR replaced G-tube on 12/16, dietitian following.  Slowly uptitrate over time oral intake and down the road tube feeds can be potentially discontinued.  -Now on Eliquis (new med) -CT scan shows concerning for possible arterial clot but on the ultrasound it confirms the DVT.  Discussed with radiology, this is a typo in terms of arterial clot.  Indeed this is venous clot -C/w Pain control with Acteaminophen 650 mg po q6hprn Mild Pain, Tramadol 50 mg po  q6hprn Moderate Pain, and Oxycodone IR 5 mg po q4hprn Severe Pain and C/w IV Hydromorphone 0.5 mg q4hprn Severe Pain -She is medically stable for discharge at this time  General Anxiety Disorder -Apparently Cymbalta was stopped at SNF facility between recent admission and this admission.  -Confirmed by pharmacy team -Continue to Monitor closely   Metabolic Acidosis, improved -Had a slight metabolic acidosis with a CO2 of 20, anion gap of 8, chloride level 106; now she has a CO2 of 22, anion gap of 8, chloride level of 106 -Continue to Monitor and Trend and Repeat CMP in the AM  Hypomagnesemia -Patient's Mag Level Trend: Recent Labs  Lab 04/12/23 0522 04/12/23 1625 04/13/23 0510 04/13/23 1717 04/14/23 0547 04/15/23 0457 04/16/23 0716  MG 1.6* 2.7* 2.2 2.1 1.8 1.8 1.6*  -Replete with IV Mag Sulfate 2 grams -Continue to Monitor and Replete as Necessary -Repeat Mag in the AM   Normocytic Anemia -Hemoglobin around baseline of 9. -C/w Ferrous Sulfate 325 mg po Daily  -Hgb/Hct Trend: Recent Labs  Lab 04/10/23 2018 04/11/23 0615 04/12/23 0522 04/13/23 0510 04/14/23 0542 04/15/23 0457 04/16/23 0716  HGB 9.1* 9.1* 7.5* 8.4* 7.6* 8.1* 8.8*  HCT 28.2* 27.8* 24.9* 26.2* 24.4* 26.1* 29.5*  MCV 92.8 93.0 95.8 94.9 95.3 96.7 98.3  -Checked Anemia Panel and it showed an iron level of 45, UIBC of 134, TIBC 179, saturation ratios of 25%, ferritin level 206, folate of 10.8, vitamin B12 151 -Continue to Monitor for S/Sx of Bleeding; No overt bleeding noted -Repeat CBC within 1 week    Gout -Not taking Colchicine 1.2 mg po  Daily anymore  GERD/Gastritis/GI Prophylaxis -Continue PPI with Pantoprazole 40 mg po Daily and Sucralfate 1 gram po TIDwm and qHS  Hypoalbuminemia -Patient's Albumin Trend: Recent Labs  Lab 03/21/23 0715 03/23/23 0335 03/26/23 0338 03/27/23 0242 04/10/23 2018 04/11/23 0615 04/16/23 0716  ALBUMIN 2.7* 2.4* 1.9* 1.7* 1.8* 2.0* 2.0*  -Continue to Monitor  and Trend and repeat CMP in the AM  Nutrition Status: Nutrition Problem: Altered nutrition lab value Etiology: 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   Weight as of this encounter: 99.1 kg.  -Weight Loss and Dietary Counseling given   DVT prophylaxis:  apixaban (ELIQUIS) tablet 10 mg  apixaban (ELIQUIS) tablet 5 mg    Code Status: Limited: Do not attempt resuscitation (DNR) -DNR-LIMITED -Do Not Intubate/DNI  Family Communication: No family present at bedsie  Disposition Plan:  Level of care: Telemetry Status is: Inpatient Remains inpatient appropriate because: She is medically stable for discharge but needs insurance authorization for SNF discharge and bed availability   Consultants:  Interventional Radiology  Procedures:  As delineated as above  Antimicrobials:  Anti-infectives (From admission, onward)    None       Subjective: Seen and examined at bedside and she is doing fairly well.  No nausea or vomiting.  Still having some slight abdominal discomfort.  No other concerns or complaints at this time and remains medically stable for discharge  Objective: Vitals:   04/15/23 1241 04/15/23 1956 04/16/23 0415 04/16/23 0500  BP: (!) 157/69 (!) 148/66 (!) 140/69   Pulse: 91 99 97   Resp: 20 16 19    Temp: 98.9 F (37.2 C) 99.2 F (37.3 C) 98.6 F (37 C)   TempSrc: Oral Oral Oral   SpO2: 98% 99% 100%   Weight:    99.1 kg  Height:        Intake/Output Summary (Last 24 hours) at 04/16/2023 1315 Last data filed at 04/16/2023 3845 Gross per 24 hour  Intake 1332.75 ml  Output 900 ml  Net 432.75 ml    Filed Weights   04/14/23 0338 04/15/23 0500 04/16/23 0500  Weight: 99.7 kg 98.6 kg 99.1 kg   Examination: Physical Exam:  Constitutional: WN/WD morbidly obese female in NAD Respiratory: Diminished to auscultation bilaterally, no wheezing, rales, rhonchi or crackles. Normal respiratory effort and patient is not tachypenic. No accessory muscle use. Unlabored breathing  Cardiovascular: RRR, no murmurs / rubs / gallops. S1 and S2 auscultated. No extremity edema. .  Abdomen: Soft, mildly-tender, distended 2/2 body habitus and PEG tube is in place. Bowel sounds positive.  GU: Deferred. Musculoskeletal: No clubbing / cyanosis of digits/nails. No joint deformity upper and lower extremities.  Skin: No rashes, lesions, ulcers on a limited skin evaluation. No induration; Warm and dry.  Neurologic: CN 2-12 grossly intact with no focal deficits. Romberg sign and cerebellar reflexes not assessed.  Psychiatric: Normal judgment and insight. Alert and oriented x 3. Normal mood and appropriate affect.   Data Reviewed: I have personally reviewed following labs and imaging studies  CBC: Recent Labs  Lab 04/10/23 2018 04/11/23 0615 04/12/23 0522 04/13/23 0510 04/14/23 0542 04/15/23 0457 04/16/23 0716  WBC 4.0 4.6 3.4* 4.9 4.3 5.2  5.5  NEUTROABS 2.1 1.9  --   --  1.6* 2.1 2.2  HGB 9.1* 9.1* 7.5* 8.4* 7.6* 8.1* 8.8*  HCT 28.2* 27.8* 24.9* 26.2* 24.4* 26.1* 29.5*  MCV 92.8 93.0 95.8 94.9 95.3 96.7 98.3  PLT 613* 581* 499* 514* 417* 377 415*   Basic Metabolic Panel: Recent Labs  Lab 04/12/23 0522 04/12/23 1625 04/13/23 0510 04/13/23 1717 04/14/23 0547 04/15/23 0457 04/16/23 0716  NA 134*  --  135  --  135 134* 136  K 3.5  --  3.6  --  3.8 4.0 4.2  CL 103  --  105  --  106 106 106  CO2 23  --  21*  --  21* 20* 22  GLUCOSE 195*  --  101*  --  95 100* 96  BUN 14  --  13  --  15 18 20   CREATININE 0.65  --  0.70  --  0.64 0.63 0.58  CALCIUM 8.1*  --  8.1*  --  8.0* 8.3* 8.6*  MG 1.6* 2.7*  2.2 2.1 1.8 1.8 1.6*  PHOS 3.9 3.9 3.9 3.5 3.3  --  3.2   GFR: Estimated Creatinine Clearance: 70.6 mL/min (by C-G formula based on SCr of 0.58 mg/dL). Liver Function Tests: Recent Labs  Lab 04/10/23 2018 04/11/23 0615 04/16/23 0716  AST 46* 50* 46*  ALT 29 28 27   ALKPHOS 181* 169* 121  BILITOT 0.3 0.6 0.5  PROT 6.3* 6.6 6.2*  ALBUMIN 1.8* 2.0* 2.0*   Recent Labs  Lab 04/10/23 2018  LIPASE 28   No results for input(s): "AMMONIA" in the last 168 hours. Coagulation Profile: No results for input(s): "INR", "PROTIME" in the last 168 hours. Cardiac Enzymes: No results for input(s): "CKTOTAL", "CKMB", "CKMBINDEX", "TROPONINI" in the last 168 hours. BNP (last 3 results) No results for input(s): "PROBNP" in the last 8760 hours. HbA1C: No results for input(s): "HGBA1C" in the last 72 hours. CBG: Recent Labs  Lab 04/15/23 1959 04/16/23 0028 04/16/23 0412 04/16/23 0738 04/16/23 1135  GLUCAP 98 95 94 82 105*   Lipid Profile: No results for input(s): "CHOL", "HDL", "LDLCALC", "TRIG", "CHOLHDL", "LDLDIRECT" in the last 72 hours. Thyroid Function Tests: No results for input(s): "TSH", "T4TOTAL", "FREET4", "T3FREE", "THYROIDAB" in the last 72 hours. Anemia Panel: Recent Labs    04/16/23 0716  VITAMINB12 951*  FOLATE 10.8  FERRITIN 206  TIBC 179*  IRON 45  RETICCTPCT 3.1   Sepsis Labs: No results for input(s): "PROCALCITON", "LATICACIDVEN" in the last 168 hours.  No results found for this or any previous visit (from the past 240 hours).   Radiology Studies: No results found.  Scheduled Meds:  apixaban  10 mg Per Tube BID   Followed by   Melene Muller ON 04/19/2023] apixaban  5 mg Per Tube BID   ascorbic acid  500 mg Per Tube Daily   cholecalciferol  5,000 Units Per Tube Daily   copper  2 mg Per Tube Daily   feeding supplement (PROSource TF20)  60 mL Per Tube Daily   ferrous sulfate  300 mg Per Tube Daily   multivitamin with minerals  1 tablet Per Tube Daily    pantoprazole (PROTONIX) IV  40 mg Intravenous QHS   sucralfate  1 g Per Tube TID WC & HS   Continuous Infusions:  feeding supplement (JEVITY 1.2 CAL) 1,000 mL (04/16/23 0617)    LOS: 5 days   Marguerita Merles, DO [facility] Available via Epic secure chat 7am-7pm  After these hours, please refer to coverage provider listed on [email or web address] 04/16/2023, 1:15 PM

## 2023-04-16 NOTE — TOC Progression Note (Addendum)
Transition of Care [facility]) - Progression Note    Patient Details  Name: Christine [name] MRN: [id number] Date of Birth: 1950/08/22  Transition of Care [facility]) CM/SW Contact  Adrian Prows, RN Phone Number: 04/16/2023, 2:08 PM  Clinical Narrative:    Fernanda Drum at [facility] [phone] ) to check on status of ins auth;she will call this RN CM back w/ update; awaiting return call.  [phone]- call back from [place]; she says ins Berkley Harvey is still pending.  Expected Discharge Plan: Skilled Nursing Facility Barriers to Discharge: Insurance Authorization  Expected Discharge Plan and Services                                               Social Determinants of Health (SDOH) Interventions SDOH Screenings   Food Insecurity: No Food Insecurity (04/11/2023)  Housing: Low Risk  (04/11/2023)  Transportation Needs: No Transportation Needs (04/11/2023)  Utilities: Not At Risk (04/11/2023)  Alcohol Screen: Low Risk  (07/13/2022)  Depression (PHQ2-9): Low Risk  (01/21/2022)  Financial Resource Strain: Low Risk  (12/01/2022)   Received from [facility]  Physical Activity: Insufficiently Active (07/13/2022)  Social Connections: Moderately Integrated (12/01/2022)   Received from [facility]  Stress: No Stress Concern Present (07/13/2022)  Tobacco Use: Medium Risk (04/10/2023)  Health Literacy: Medium Risk (12/01/2022)   Received from [facility]    Readmission Risk Interventions    03/23/2023   10:43 AM 01/09/2023   11:11 AM  Readmission Risk Prevention Plan  Transportation Screening Complete Complete  HRI or Home Care Consult  Complete  Social Work Consult for Recovery Care Planning/Counseling  Complete  Palliative Care Screening  Not Applicable  Medication Review Oceanographer) Complete Complete  PCP or Specialist appointment within 3-5 days of discharge Complete   HRI or Home Care Consult Complete   SW Recovery Care/Counseling  Consult Complete   Palliative Care Screening Not Applicable   Skilled Nursing Facility Complete

## 2023-04-16 NOTE — Plan of Care (Signed)
  Problem: Clinical Measurements: Goal: Will remain free from infection Outcome: Progressing   Problem: Activity: Goal: Risk for activity intolerance will decrease Outcome: Progressing   Problem: Nutrition: Goal: Adequate nutrition will be maintained Outcome: Progressing   Problem: Coping: Goal: Level of anxiety will decrease Outcome: Progressing   Problem: Pain Management: Goal: General experience of comfort will improve Outcome: Progressing

## 2023-04-16 NOTE — Plan of Care (Signed)
Awaiting rehab bed placement

## 2023-04-17 DIAGNOSIS — T85528A Displacement of other gastrointestinal prosthetic devices, implants and grafts, initial encounter: Secondary | ICD-10-CM | POA: Diagnosis not present

## 2023-04-17 DIAGNOSIS — F411 Generalized anxiety disorder: Secondary | ICD-10-CM | POA: Diagnosis not present

## 2023-04-17 DIAGNOSIS — R627 Adult failure to thrive: Secondary | ICD-10-CM | POA: Diagnosis not present

## 2023-04-17 LAB — GLUCOSE, CAPILLARY
Glucose-Capillary: 112 mg/dL — ABNORMAL HIGH (ref 70–99)
Glucose-Capillary: 76 mg/dL (ref 70–99)
Glucose-Capillary: 83 mg/dL (ref 70–99)
Glucose-Capillary: 90 mg/dL (ref 70–99)
Glucose-Capillary: 93 mg/dL (ref 70–99)
Glucose-Capillary: 95 mg/dL (ref 70–99)

## 2023-04-17 MED ORDER — ASCORBIC ACID 500 MG PO TABS: 500.0000 mg | ORAL_TABLET | Freq: Every day | ORAL | Status: DC

## 2023-04-17 MED ORDER — COPPER 2 MG PO TABS: 2.0000 mg | Freq: Every day | Status: DC

## 2023-04-17 MED ORDER — SUCRALFATE 1 GM/10ML PO SUSP: 1.0000 g | Freq: Three times a day (TID) | ORAL | Status: DC

## 2023-04-17 MED ORDER — ONDANSETRON HCL 4 MG PO TABS: 4.0000 mg | ORAL_TABLET | Freq: Four times a day (QID) | ORAL | Status: DC | PRN

## 2023-04-17 MED ORDER — SENNOSIDES-DOCUSATE SODIUM 8.6-50 MG PO TABS: 1.0000 | ORAL_TABLET | Freq: Every evening | ORAL | Status: DC | PRN

## 2023-04-17 MED ORDER — FERROUS SULFATE 300 (60 FE) MG/5ML PO SOLN: 300.0000 mg | Freq: Every day | ORAL | Status: DC

## 2023-04-17 MED ORDER — GUAIFENESIN 100 MG/5ML PO LIQD: 5.0000 mL | ORAL | Status: DC | PRN

## 2023-04-17 MED ORDER — ADULT MULTIVITAMIN W/MINERALS CH: 1.0000 | ORAL_TABLET | Freq: Every day | ORAL | Status: DC

## 2023-04-17 MED ORDER — VITAMIN D3 25 MCG PO TABS: 5000.0000 [IU] | ORAL_TABLET | Freq: Every day | ORAL | Status: DC

## 2023-04-17 NOTE — Progress Notes (Signed)
Mobility Specialist - Progress Note   04/17/23 1125  Mobility  Activity Ambulated with assistance in room  Level of Assistance Minimal assist, patient does 75% or more  Assistive Device Front wheel walker  Distance Ambulated (ft) 10 ft  Range of Motion/Exercises Active Assistive  Activity Response Tolerated fair  Mobility Referral Yes  Mobility visit 1 Mobility  Mobility Specialist Start Time (ACUTE ONLY) 1100  Mobility Specialist Stop Time (ACUTE ONLY) 1125  Mobility Specialist Time Calculation (min) (ACUTE ONLY) 25 min   Pt was found in bed and agreeable to seated exercises. Pt sat EOB and preformed the exercises listed below. Afterwards agreeable to ambulate in room. Had x2 failed STS before being able to complete x1 STS. Grew fatigued with session and at EOS returned to bed with all needs met. Call bell in reach.  Seated BLE Exercises: 10 reps each  1) Toe Raise/ Heel Raise  2) Knee Extension (hold 3 seconds)  3) Marching    4) Hip Adduction (pillow squeezes)   Billey Chang Mobility Specialist

## 2023-04-17 NOTE — Plan of Care (Signed)

## 2023-04-17 NOTE — Plan of Care (Signed)
Cont with plan of care

## 2023-04-18 DIAGNOSIS — F411 Generalized anxiety disorder: Secondary | ICD-10-CM | POA: Diagnosis not present

## 2023-04-18 DIAGNOSIS — I824Y9 Acute embolism and thrombosis of unspecified deep veins of unspecified proximal lower extremity: Secondary | ICD-10-CM | POA: Diagnosis not present

## 2023-04-18 DIAGNOSIS — T85528A Displacement of other gastrointestinal prosthetic devices, implants and grafts, initial encounter: Secondary | ICD-10-CM | POA: Diagnosis not present

## 2023-04-18 DIAGNOSIS — R627 Adult failure to thrive: Secondary | ICD-10-CM | POA: Diagnosis not present

## 2023-04-18 LAB — CBC
HCT: 28 % — ABNORMAL LOW (ref 36.0–46.0)
Hemoglobin: 8.8 g/dL — ABNORMAL LOW (ref 12.0–15.0)
MCH: 30.7 pg (ref 26.0–34.0)
MCHC: 31.4 g/dL (ref 30.0–36.0)
MCV: 97.6 fL (ref 80.0–100.0)
Platelets: 311 10*3/uL (ref 150–400)
RBC: 2.87 MIL/uL — ABNORMAL LOW (ref 3.87–5.11)
RDW: 18 % — ABNORMAL HIGH (ref 11.5–15.5)
WBC: 6.9 10*3/uL (ref 4.0–10.5)
nRBC: 0 % (ref 0.0–0.2)

## 2023-04-18 LAB — GLUCOSE, CAPILLARY
Glucose-Capillary: 111 mg/dL — ABNORMAL HIGH (ref 70–99)
Glucose-Capillary: 95 mg/dL (ref 70–99)
Glucose-Capillary: 116 mg/dL — ABNORMAL HIGH (ref 70–99)
Glucose-Capillary: 88 mg/dL (ref 70–99)
Glucose-Capillary: 106 mg/dL — ABNORMAL HIGH (ref 70–99)

## 2023-04-18 LAB — BASIC METABOLIC PANEL
Anion gap: 9 (ref 5–15)
BUN: 21 mg/dL (ref 8–23)
CO2: 21 mmol/L — ABNORMAL LOW (ref 22–32)
Calcium: 8.5 mg/dL — ABNORMAL LOW (ref 8.9–10.3)
Chloride: 107 mmol/L (ref 98–111)
Creatinine, Ser: 0.65 mg/dL (ref 0.44–1.00)
GFR, Estimated: >60 mL/min (ref >60)
Glucose, Bld: 97 mg/dL (ref 70–99)
Potassium: 4 mmol/L (ref 3.5–5.1)
Sodium: 137 mmol/L (ref 135–145)

## 2023-04-18 MED ORDER — WITCH HAZEL-GLYCERIN EX PADS: MEDICATED_PAD | CUTANEOUS | Status: DC | PRN

## 2023-04-18 MED ORDER — LIDOCAINE 4 % EX CREA
TOPICAL_CREAM | Freq: Two times a day (BID) | CUTANEOUS | Status: DC
  Filled 2023-04-18: qty 5

## 2023-04-18 MED ORDER — LOPERAMIDE HCL 2 MG PO CAPS
2.0000 mg | ORAL_CAPSULE | Freq: Once | ORAL | Status: AC
  Administered 2023-04-18: 2 mg via ORAL
  Filled 2023-04-18: qty 1

## 2023-04-18 NOTE — Progress Notes (Signed)
PT Cancellation Note  Patient Details Name: Christine [name] MRN: [id number] DOB: [birth year]   Cancelled Treatment:    Reason Eval/Treat Not Completed: Patient declined, pt states she was up all night long and just cannot do PT. PT to continue to follow acutely.    [name], PT Acute Rehab  [name] 04/18/2023, 11:06 AM

## 2023-04-18 NOTE — Plan of Care (Signed)

## 2023-04-18 NOTE — TOC Progression Note (Signed)
Transition of Care [facility]) - Progression Note    Patient Details  Name: Christine [name] MRN: [id number] Date of Birth: Apr 17, 1951  Transition of Care [facility]) CM/SW Contact  Howell Rucks, RN Phone Number: 04/18/2023, 9:32 AM Contacted [place] at [facility] [place] [phone]   Clinical Narrative:   Fernanda Drum at [facility] [phone]), states auth pending, will contact [employer] and call NCM back     Expected Discharge Plan: Skilled Nursing Facility Barriers to Discharge: Insurance Authorization  Expected Discharge Plan and Services                                               Social Determinants of Health (SDOH) Interventions SDOH Screenings   Food Insecurity: No Food Insecurity (04/11/2023)  Housing: Low Risk  (04/11/2023)  Transportation Needs: No Transportation Needs (04/11/2023)  Utilities: Not At Risk (04/11/2023)  Alcohol Screen: Low Risk  (07/13/2022)  Depression (PHQ2-9): Low Risk  (01/21/2022)  Financial Resource Strain: Low Risk  (12/01/2022)   Received from [facility]  Physical Activity: Insufficiently Active (07/13/2022)  Social Connections: Moderately Integrated (12/01/2022)   Received from [facility]  Stress: No Stress Concern Present (07/13/2022)  Tobacco Use: Medium Risk (04/10/2023)  Health Literacy: Medium Risk (12/01/2022)   Received from [facility]    Readmission Risk Interventions    03/23/2023   10:43 AM 01/09/2023   11:11 AM  Readmission Risk Prevention Plan  Transportation Screening Complete Complete  HRI or Home Care Consult  Complete  Social Work Consult for Recovery Care Planning/Counseling  Complete  Palliative Care Screening  Not Applicable  Medication Review Oceanographer) Complete Complete  PCP or Specialist appointment within 3-5 days of discharge Complete   HRI or Home Care Consult Complete   SW Recovery Care/Counseling Consult Complete   Palliative Care Screening Not  Applicable   Skilled Nursing Facility Complete

## 2023-04-18 NOTE — Progress Notes (Signed)
[facility]                                                                              Christine [name], is a 72 y.o. female, DOB - [birth year], [id number] Admit date - 04/10/2023    Outpatient Primary MD for the patient is Pcp, No  LOS - 7  days  No chief complaint on file.      Brief summary   Patient is a 72 year old with HTN, HLD, OSA on CPAP, GERD, anxiety, morbid obesity, esophageal dysmotility with gastric bypass 2024, FTT with G-tube placement, sacral wound, ESBL UTI recently admitted from 11/25 - 12/11 for perforated ulcer at gastrojejunal anastomosis with anterior side anastomosis repaired with [name] patch and discharged to SNF.  Over past 3 days she has developed more abdominal pain located at the tube insertion site.  CT abdomen pelvis shows percutaneous G-tube with balloon in the subcutaneous fat.  Concerning of filling defect in the left superficial femoral vein concerning for DVT which is confirmed on the ultrasound. IR replaced G-tube on 12/16.  Heparin drip transitioned to p.o. eliquis, awaiting SNF    Assessment & Plan     Dislodged gastrostomy tube s/p recent perforated marginal ulcer -IR replaced G-tube on 12/16, dietitian following.   -Plan to slowly uptitrate her oral intake and discontinue G-tube in future once caloric intake is optimal -Continue pain control   Acute DVT in the left common femoral vein -Venous Dopplers 12/16 showed acute DVT in the left common femoral vein -CTA chest negative for PE -Started on Eliquis  General Anxiety Disorder -Currently stable, no acute issues, not on Cymbalta   Non-anion gap metabolic acidosis -Continue to monitor, had diarrhea yesterday had a slight metabolic acidosis  -Imodium x 1   Hypomagnesemia -Was replaced on 12/21    Normocytic Anemia -Stable, H&H close to baseline, 8.8 -Anemia profile showed Fe 45, TIBC 179, ferritin 206 folate 10.8, B12 951   Gout -Not taking Colchicine    GERD/Gastritis/GI Prophylaxis -Continue Protonix, Carafate  Diarrhea Imodium x 1, Tucks pad and lidocaine cream   Hypoalbuminemia -Continue to supplement oral diet with tube feeds until optimal caloric intake Nutrition Problem: Altered nutrition lab value Etiology: 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   Weight as of this encounter: 99.7 kg.  Code Status: DNR DVT Prophylaxis:   apixaban (ELIQUIS) tablet 10 mg  apixaban (ELIQUIS) tablet 5 mg   Level of Care: Level of care: Telemetry Family Communication: Updated patient Disposition Plan:      Remains inpatient appropriate: Awaiting SNF   Procedures:  G-tube replaced on 12/16  Consultants:   Interventional radiology General Surgery  Antimicrobials:   Anti-infectives (From admission, onward)    None          Medications  apixaban  10 mg Per Tube BID   Followed by   [  START ON 04/19/2023] apixaban  5 mg Per Tube BID   ascorbic acid  500 mg Per Tube Daily   cholecalciferol  5,000 Units Per Tube Daily   copper  2 mg Per Tube Daily   feeding supplement (PROSource TF20)  60 mL Per Tube Daily   ferrous sulfate  300 mg Per Tube Daily   lidocaine   Topical BID   multivitamin with minerals  1 tablet Per Tube Daily   pantoprazole (PROTONIX) IV  40 mg Intravenous QHS   sucralfate  1 g Per Tube TID WC & HS      Subjective:   Christine [name] was seen and examined today.  States yesterday had diarrhea now with burning rectal pain from the diarrhea.  No chest pain, shortness of breath, fevers or chills.    Objective:   Vitals:   04/17/23 2040 04/18/23 0406 04/18/23 0643 04/18/23 1257  BP:  (!) 150/72 133/63  134/67  Pulse: (!) 101 (!) 102  (!) 101  Resp: 18 18  18   Temp: 98.5 F (36.9 C) 98.2 F (36.8 C)    TempSrc: Oral Oral    SpO2: 100% 98%  100%  Weight:   99.7 kg   Height:        Intake/Output Summary (Last 24 hours) at 04/18/2023 1549 Last data filed at 04/18/2023 1000 Gross per 24 hour  Intake 2567.25 ml  Output --  Net 2567.25 ml     Wt Readings from Last 3 Encounters:  04/18/23 99.7 kg  04/06/23 105.9 kg  02/03/23 103 kg     Exam General: Alert and oriented x 3, NAD Cardiovascular: S1 S2 auscultated,  RRR Respiratory: Clear to auscultation bilaterally Gastrointestinal: Soft, dressing intact, + bowel sounds, PEG tube Ext: no pedal edema bilaterally Neuro: no neuro deficits Psych: Normal affect     Data Reviewed:  I have personally reviewed following labs    CBC Lab Results  Component Value Date   WBC 6.9 04/18/2023   RBC 2.87 (L) 04/18/2023   HGB 8.8 (L) 04/18/2023   HCT 28.0 (L) 04/18/2023   MCV 97.6 04/18/2023   MCH 30.7 04/18/2023   PLT 311 04/18/2023   MCHC 31.4 04/18/2023   RDW 18.0 (H) 04/18/2023   LYMPHSABS 2.4 04/16/2023   MONOABS 0.5 04/16/2023   EOSABS 0.3 04/16/2023   BASOSABS 0.1 04/16/2023     Last metabolic panel Lab Results  Component Value Date   NA 137 04/18/2023   K 4.0 04/18/2023   CL 107 04/18/2023   CO2 21 (L) 04/18/2023   BUN 21 04/18/2023   CREATININE 0.65 04/18/2023   GLUCOSE 97 04/18/2023   GFRNONAA >60 04/18/2023   GFRAA >60 02/28/2019   CALCIUM 8.5 (L) 04/18/2023   PHOS 3.2 04/16/2023   PROT 6.2 (L) 04/16/2023   ALBUMIN 2.0 (L) 04/16/2023   LABGLOB 3.8 07/26/2017   BILITOT 0.5 04/16/2023   ALKPHOS 121 04/16/2023   AST 46 (H) 04/16/2023   ALT 27 04/16/2023   ANIONGAP 9 04/18/2023    CBG (last 3)  Recent Labs    04/18/23 0404 04/18/23 0750 04/18/23 1253  GLUCAP 111* 95 116*      Coagulation Profile: No results for input(s): "INR", "PROTIME" in the last 168  hours.   Radiology Studies: I have personally reviewed the imaging studies  No results found.     Thad Ranger M.D. [facility] 04/18/2023, 3:49 PM  Available via Epic secure chat 7am-7pm After 7 pm, please refer to  night coverage provider listed on amion.

## 2023-04-19 DIAGNOSIS — T85528A Displacement of other gastrointestinal prosthetic devices, implants and grafts, initial encounter: Secondary | ICD-10-CM | POA: Diagnosis not present

## 2023-04-19 DIAGNOSIS — R627 Adult failure to thrive: Secondary | ICD-10-CM | POA: Diagnosis not present

## 2023-04-19 DIAGNOSIS — F411 Generalized anxiety disorder: Secondary | ICD-10-CM | POA: Diagnosis not present

## 2023-04-19 DIAGNOSIS — I824Y9 Acute embolism and thrombosis of unspecified deep veins of unspecified proximal lower extremity: Secondary | ICD-10-CM | POA: Diagnosis not present

## 2023-04-19 LAB — GLUCOSE, CAPILLARY
Glucose-Capillary: 107 mg/dL — ABNORMAL HIGH (ref 70–99)
Glucose-Capillary: 115 mg/dL — ABNORMAL HIGH (ref 70–99)
Glucose-Capillary: 118 mg/dL — ABNORMAL HIGH (ref 70–99)
Glucose-Capillary: 94 mg/dL (ref 70–99)
Glucose-Capillary: 96 mg/dL (ref 70–99)
Glucose-Capillary: 97 mg/dL (ref 70–99)

## 2023-04-19 NOTE — Progress Notes (Addendum)
[facility]                                                                              Christine [name], is a 72 y.o. female, DOB - 1950-05-17, [id number] Admit date - 04/10/2023    Outpatient Primary MD for the patient is Pcp, No  LOS - 8  days  No chief complaint on file.      Brief summary   Patient is a 72 year old with HTN, HLD, OSA on CPAP, GERD, anxiety, morbid obesity, esophageal dysmotility with gastric bypass 2024, FTT with G-tube placement, sacral wound, ESBL UTI recently admitted from 11/25 - 12/11 for perforated ulcer at gastrojejunal anastomosis with anterior side anastomosis repaired with Christine [name] patch and discharged to SNF.  Over past 3 days she has developed more abdominal pain located at the tube insertion site.  CT abdomen pelvis shows percutaneous G-tube with balloon in the subcutaneous fat.  Concerning of filling defect in the left superficial femoral vein concerning for DVT which is confirmed on the ultrasound. IR replaced G-tube on 12/16.  Heparin drip transitioned to p.o. eliquis, awaiting SNF    Assessment & Plan     Dislodged gastrostomy tube s/p recent perforated marginal ulcer -IR replaced G-tube on 12/16, dietitian following.   -Plan to slowly uptitrate her oral intake and discontinue G-tube in future once caloric intake is optimal -Continue pain control -Continue wet-to-dry dressing twice daily for the open wound, abdominal midline -Monitor PEG site closely, cleaned today, if any fevers, leukocytosis, worsening of pain at PEG site, placed on antibiotics    Acute DVT in the left common femoral vein -Venous Dopplers 12/16 showed acute DVT in the left common femoral vein -CTA chest negative for PE -Started on Eliquis, H&H stable  General Anxiety Disorder -Currently stable, no acute issues, not on Cymbalta   Non-anion gap metabolic acidosis -Continue to monitor, BMET in a.m.   Hypomagnesemia -Was replaced on 12/21     Normocytic Anemia -Stable, H&H close to baseline, 8.8 -Anemia profile showed Fe 45, TIBC 179, ferritin 206 folate 10.8, B12 951   Gout -Not taking Colchicine   GERD/Gastritis/GI Prophylaxis -Continue Protonix, Carafate  Diarrhea -Improving, continue Tucks pad and lidocaine cream   Generalized debility, wheelchair-bound, gait instability -Prior to this admission, patient was at [facility] for [facility] months.  Before that, she has lived independently alone, however with significant debility, ongoing issues, she is wheelchair-bound able to transfer, not ambulating.  Per the last PT note, she had needed significant assistance with ADLs and clearly not able to independently manage at home.  Does not have any family support, sister, Christine, [name] POA lives in [place].  She also has open wound that requires wet-to-dry dressing daily.   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   Weight as of this encounter: 99.7 kg.  Code Status: DNR DVT Prophylaxis:   apixaban (ELIQUIS) tablet 5 mg   Level of Care: Level of care: Telemetry Family Communication: Updated patient Disposition Plan:      Remains inpatient appropriate: Notified by [facility] that patient's insurance had denied SNF and requesting peer to peer.  Planning on peer to peer today.  Addendum: 2:00 PM  Completed peer to peer with insurance, per Dr. Lanell Matar, the issue is not that she does not qualify for SNF. She has exhausted 97/100 days of SNF. She can appeal it however she  will be only approved for 3 days of skilled care and subsequent stay in SNF will be paid out-of-pocket. The peer to peer physician wanted to know if she has Medicaid application because she could not find it in the system. That will give her additional 20 days If she gets Medicaid, they can do retrocoverage as well and cover SNF. She needs 60 days out of SNF/hospital etc. to reset this.   Notified TOC, will need to check if patient has Medicaid otherwise she will need home health PT OT RN home health aide, private caregiver if she needs more supervision to reset SNF qualification.    Procedures:  G-tube replaced on 12/16  Consultants:   Interventional radiology General Surgery  Antimicrobials:   Anti-infectives (From admission, onward)    None          Medications  apixaban  5 mg Per Tube BID   ascorbic acid  500 mg Per Tube Daily   cholecalciferol  5,000 Units Per Tube Daily   copper  2 mg Per Tube Daily   feeding supplement (PROSource TF20)  60 mL Per Tube Daily   ferrous sulfate  300 mg Per Tube Daily   lidocaine   Topical BID   multivitamin with minerals  1 tablet Per Tube Daily   pantoprazole (PROTONIX) IV  40 mg Intravenous QHS   sucralfate  1 g Per Tube TID WC & HS      Subjective:   Christine [name] was seen and examined today.  States having abdominal pain intermittently, has midline abdominal open wound with dressing changes daily.  Dressing changed around the PEG tube site.  No  No chest pain, shortness of breath, fevers or chills.    Objective:   Vitals:   04/18/23 0643 04/18/23 1257 04/18/23 2029 04/19/23 0424  BP:  134/67 (!) 129/59 133/75  Pulse:  (!) 101 100 94  Resp:  18 20   Temp:   98.2 F (36.8 C) 98.2 F (36.8 C)  TempSrc:   Oral Oral  SpO2:  100% 98% 99%  Weight: 99.7 kg     Height:        Intake/Output Summary (Last 24 hours) at 04/19/2023 1022 Last data filed at 04/19/2023 0948 Gross per 24 hour  Intake 600 ml  Output 1100 ml  Net  -500 ml     Wt Readings from Last 3 Encounters:  04/18/23 99.7 kg  04/06/23 105.9 kg  02/03/23 103 kg   Physical Exam General: Alert and oriented x 3, NAD Cardiovascular: S1 S2 clear, RRR.  Respiratory: CTAB, no wheezing, rales or rhonchi Gastrointestinal: Soft, PEG tube, open midline incision wound, + BS Ext: no pedal edema bilaterally Neuro: no new deficits Skin: See picture of the abdominal wound Psych: Normal affect and demeanor, alert and oriented x3  Data Reviewed:  I have personally reviewed following labs    CBC Lab Results  Component Value Date   WBC 6.9 04/18/2023   RBC 2.87 (L) 04/18/2023   HGB 8.8 (L) 04/18/2023   HCT 28.0 (L) 04/18/2023   MCV 97.6 04/18/2023   MCH 30.7 04/18/2023   PLT 311 04/18/2023   MCHC 31.4 04/18/2023   RDW 18.0 (H) 04/18/2023   LYMPHSABS 2.4 04/16/2023   MONOABS 0.5 04/16/2023   EOSABS 0.3 04/16/2023   BASOSABS 0.1 04/16/2023     Last metabolic panel Lab Results  Component Value Date   NA 137 04/18/2023   K 4.0 04/18/2023   CL 107 04/18/2023   CO2 21 (L) 04/18/2023   BUN 21 04/18/2023   CREATININE 0.65 04/18/2023   GLUCOSE 97 04/18/2023   GFRNONAA >60 04/18/2023   GFRAA >60 02/28/2019   CALCIUM 8.5 (L) 04/18/2023   PHOS 3.2 04/16/2023   PROT 6.2 (L) 04/16/2023   ALBUMIN 2.0 (L) 04/16/2023   LABGLOB 3.8 07/26/2017   BILITOT 0.5 04/16/2023   ALKPHOS 121 04/16/2023   AST 46 (H) 04/16/2023   ALT 27 04/16/2023   ANIONGAP 9 04/18/2023    CBG (last 3)  Recent Labs    04/19/23 0003 04/19/23 0425 04/19/23 0807  GLUCAP 96 107* 97      Coagulation Profile: No results for input(s): "INR", "PROTIME" in the last 168 hours.   Radiology Studies: I have personally reviewed the imaging studies  No results found.     Thad Ranger M.D. [facility] 04/19/2023, 10:22 AM  Available via Epic secure chat 7am-7pm After 7 pm, please refer to night coverage provider listed on amion.

## 2023-04-19 NOTE — Progress Notes (Signed)
Physical Therapy Treatment Patient Details Name: Christine [name] MRN: [id number] DOB: Nov 18, 1950 Today's Date: 04/19/2023   History of Present Illness 72 year old  admitted with  abdominal pain located at the tube insertion site. CT abdomen pelvis shows percutaneous G-tube with balloon in the subcutaneous fat.IR replaced G-tube on 12/16. Left superficial femoral vein DVT. PMH: HTN, HLD, OSA on CPAP, GERD, anxiety, morbid obesity, esophageal dysmotility with gastric bypass 2024, failure to thrive with G-tube placement, sacral wound, ESBL UTI recently admitted from 11/25 - 12/11 for perforated ulcer at gastrojejunal anastomosis with anterior side anastomosis repaired with [name] patch    PT Comments  Pt agreeable to working with therapy. Min A for mobility. Activity tolerance limited by dizziness, fatigue. Assisted pt back to bed at her request. She reports she is hopeful to be able to go back to SNF. Will continue to follow. Recommend return to SNF once medically ready.     If plan is discharge home, recommend the following: A lot of help with bathing/dressing/bathroom;Assistance with cooking/housework;Assist for transportation;Help with stairs or ramp for entrance;A lot of help with walking and/or transfers   Can travel by private vehicle     No  Equipment Recommendations  Wheelchair (measurements PT);Wheelchair cushion (measurements PT);Hospital bed;BSC/3in1    Recommendations for Other Services       Precautions / Restrictions Precautions Precautions: Fall Precaution Comments: G tube Restrictions Weight Bearing Restrictions Per Provider Order: No     Mobility  Bed Mobility Overal bed mobility: Needs Assistance Bed Mobility: Supine to Sit, Sit to Supine   Sidelying to sit: Supervision, HOB elevated, Used rails   Sit to supine: Contact guard assist, HOB elevated, Used rails   General bed mobility comments: Increased time. Cues for safety. Reliance on bedrail.     Transfers Overall transfer level: Needs assistance Equipment used: Rolling walker (2 wheels) Transfers: Sit to/from Stand Sit to Stand: Min assist, From elevated surface           General transfer comment: x2. Assist rise, steady, control descent. Pt uses momentum to stand. Standing tolerance limited by pt report of dizziness and request to sit down.    Ambulation/Gait Ambulation/Gait assistance: Min assist   Assistive device: Rolling walker (2 wheels)       Pre-gait activities: side steps along bedside with RW. Marching x 5 reps each leg (2 times). activity tolerance limited by pt report of dizziness.     Stairs             Wheelchair Mobility     Tilt Bed    Modified Rankin (Stroke Patients Only)       Balance Overall balance assessment: Needs assistance         Standing balance support: Bilateral upper extremity supported, Reliant on assistive device for balance Standing balance-Leahy Scale: Poor                              Cognition Arousal: Alert Behavior During Therapy: WFL for tasks assessed/performed Overall Cognitive Status: Within Functional Limits for tasks assessed                                 General Comments: verbose        Exercises      General Comments        Pertinent Vitals/Pain Pain Assessment Pain Assessment: Faces Pain Location: front peri area  Pain Descriptors / Indicators: Discomfort Pain Intervention(s): Limited activity within patient's tolerance, Monitored during session, Repositioned    Home Living                          Prior Function            PT Goals (current goals can now be found in the care plan section) Progress towards PT goals: Progressing toward goals    Frequency    Min 1X/week      PT Plan      Co-evaluation              AM-PAC PT "6 Clicks" Mobility   Outcome Measure  Help needed turning from your back to your side while in  a flat bed without using bedrails?: A Little Help needed moving from lying on your back to sitting on the side of a flat bed without using bedrails?: A Little Help needed moving to and from a bed to a chair (including a wheelchair)?: A Little Help needed standing up from a chair using your arms (e.g., wheelchair or bedside chair)?: A Little Help needed to walk in hospital room?: A Lot Help needed climbing 3-5 steps with a railing? : Total 6 Click Score: 15    End of Session   Activity Tolerance: Patient tolerated treatment well;Patient limited by fatigue (limited by dizziness) Patient left: in bed;with call bell/phone within reach;with bed alarm set   PT Visit Diagnosis: Muscle weakness (generalized) (M62.81);Difficulty in walking, not elsewhere classified (R26.2);History of falling (Z91.81)     Time: 8413-2440 PT Time Calculation (min) (ACUTE ONLY): 26 min  Charges:    $Therapeutic Activity: 23-37 mins PT General Charges $$ ACUTE PT VISIT: 1 Visit                        Faye Ramsay, PT Acute Rehabilitation  Office: [phone]

## 2023-04-19 NOTE — Progress Notes (Signed)
PT Cancellation Note  Patient Details Name: Christine [name] MRN: [id number] DOB: 04-14-1951   Cancelled Treatment:    Reason Eval/Treat Not Completed:  Attempted PT tx session-pt declined to participate despite encouragement from therapist. Will check back another day/time as schedule allows.    Faye Ramsay, PT Acute Rehabilitation  Office: [phone]

## 2023-04-19 NOTE — TOC Progression Note (Addendum)
Transition of Care [facility]) - Progression Note    Patient Details  Name: Flossie Haston MRN: [id number] Date of Birth: Sep 24, 1950  Transition of [facility]) CM/SW Contact  Howell Rucks, RN Phone Number: 04/19/2023, 8:49 AM Clinical Narrative:   Call received from [place] with Mikeal Hawthorne, reports request for short term rehab/SNF denied by insurance,  offer for Peer to Peer, due by NOON today, phone: [phone], attending notified.  -2:28pm Teams chat from attending "  Spent 30 minutes for peer to peer. The issue is not that she does not qualify for SNF. She has exhausted 97/100 days of SNF. She can appeal it however she will be only approved for 3 days of skilled care and subsequent stay in SNF will be paid out-of-pocket. The peer to peer physician wanted to know if she has Medicaid application because she could not find it in the system. That will give her additional 20 days If she gets Medicaid, they can do retrocoverage as well and cover SNF. She needs 60 days out of SNF/hospital etc. to reset this.  I asked about LTAC, I was told she will not qualify and will be much more expensive for her out-of-pocket. Best thing for her would be to go home with home health PT OT RN home health aide, private caregiver if she needs more supervision"    Met with pt at bedside to review results of peer to peer and discuss dc options. Pt reports she has a home she is in the process of selling and moving to [place], does not feel with her assets she will qualify for Medicaid. Pt agreeable to [facility] PT/OT/RN/HHA, reports her neighbors will help out as well, team notified. Per attending, place for dc home tomorrow    -3:32pm [employer], rep-Lynette, accepted for [facility] PT/OT/RN/HHA, added to AVS.     Expected Discharge Plan: Skilled Nursing Facility Barriers to Discharge: Insurance Authorization  Expected Discharge Plan and Services                                               Social  Determinants of Health (SDOH) Interventions SDOH Screenings   Food Insecurity: No Food Insecurity (04/11/2023)  Housing: Low Risk  (04/11/2023)  Transportation Needs: No Transportation Needs (04/11/2023)  Utilities: Not At Risk (04/11/2023)  Alcohol Screen: Low Risk  (07/13/2022)  Depression (PHQ2-9): Low Risk  (01/21/2022)  Financial Resource Strain: Low Risk  (12/01/2022)   Received from [facility]  Physical Activity: Insufficiently Active (07/13/2022)  Social Connections: Moderately Integrated (12/01/2022)   Received from [facility]  Stress: No Stress Concern Present (07/13/2022)  Tobacco Use: Medium Risk (04/10/2023)  Health Literacy: Medium Risk (12/01/2022)   Received from [facility]    Readmission Risk Interventions    03/23/2023   10:43 AM 01/09/2023   11:11 AM  Readmission Risk Prevention Plan  Transportation Screening Complete Complete  HRI or Home Care Consult  Complete  Social Work Consult for Recovery Care Planning/Counseling  Complete  Palliative Care Screening  Not Applicable  Medication Review Oceanographer) Complete Complete  PCP or Specialist appointment within 3-5 days of discharge Complete   HRI or Home Care Consult Complete   SW Recovery Care/Counseling Consult Complete   Palliative Care Screening Not Applicable   Skilled Nursing Facility Complete

## 2023-04-19 NOTE — Plan of Care (Signed)
  Problem: Education: Goal: Knowledge of General Education information will improve Description: Including pain rating scale, medication(s)/side effects and non-pharmacologic comfort measures Outcome: Progressing   Problem: Safety: Goal: Ability to remain free from injury will improve Outcome: Progressing   Problem: Skin Integrity: Goal: Risk for impaired skin integrity will decrease Outcome: Progressing   

## 2023-04-20 DIAGNOSIS — R627 Adult failure to thrive: Secondary | ICD-10-CM | POA: Diagnosis not present

## 2023-04-20 DIAGNOSIS — I824Y9 Acute embolism and thrombosis of unspecified deep veins of unspecified proximal lower extremity: Secondary | ICD-10-CM | POA: Diagnosis not present

## 2023-04-20 DIAGNOSIS — T85528A Displacement of other gastrointestinal prosthetic devices, implants and grafts, initial encounter: Secondary | ICD-10-CM | POA: Diagnosis not present

## 2023-04-20 DIAGNOSIS — F411 Generalized anxiety disorder: Secondary | ICD-10-CM | POA: Diagnosis not present

## 2023-04-20 LAB — CBC
HCT: 26.3 % — ABNORMAL LOW (ref 36.0–46.0)
Hemoglobin: 8.2 g/dL — ABNORMAL LOW (ref 12.0–15.0)
MCH: 30.1 pg (ref 26.0–34.0)
MCHC: 31.2 g/dL (ref 30.0–36.0)
MCV: 96.7 fL (ref 80.0–100.0)
Platelets: 297 10*3/uL (ref 150–400)
RBC: 2.72 MIL/uL — ABNORMAL LOW (ref 3.87–5.11)
RDW: 17.8 % — ABNORMAL HIGH (ref 11.5–15.5)
WBC: 7.1 10*3/uL (ref 4.0–10.5)
nRBC: 0 % (ref 0.0–0.2)

## 2023-04-20 LAB — BASIC METABOLIC PANEL
Anion gap: 10 (ref 5–15)
BUN: 21 mg/dL (ref 8–23)
CO2: 20 mmol/L — ABNORMAL LOW (ref 22–32)
Calcium: 8.6 mg/dL — ABNORMAL LOW (ref 8.9–10.3)
Chloride: 107 mmol/L (ref 98–111)
Creatinine, Ser: 0.54 mg/dL (ref 0.44–1.00)
GFR, Estimated: >60 mL/min (ref >60)
Glucose, Bld: 109 mg/dL — ABNORMAL HIGH (ref 70–99)
Potassium: 3.8 mmol/L (ref 3.5–5.1)
Sodium: 137 mmol/L (ref 135–145)

## 2023-04-20 LAB — GLUCOSE, CAPILLARY
Glucose-Capillary: 100 mg/dL — ABNORMAL HIGH (ref 70–99)
Glucose-Capillary: 103 mg/dL — ABNORMAL HIGH (ref 70–99)
Glucose-Capillary: 104 mg/dL — ABNORMAL HIGH (ref 70–99)
Glucose-Capillary: 120 mg/dL — ABNORMAL HIGH (ref 70–99)
Glucose-Capillary: 126 mg/dL — ABNORMAL HIGH (ref 70–99)
Glucose-Capillary: 83 mg/dL (ref 70–99)
Glucose-Capillary: 92 mg/dL (ref 70–99)

## 2023-04-20 NOTE — Progress Notes (Signed)
Occupational Therapy Treatment Patient Details Name: Christine [name] MRN: [id number] DOB: [birth year] Today's Date: 04/20/2023   History of present illness 72 year old  admitted with  abdominal pain located at the tube insertion site. CT abdomen pelvis shows percutaneous G-tube with balloon in the subcutaneous fat.IR replaced G-tube on 12/16. Left superficial femoral vein DVT. PMH: HTN, HLD, OSA on CPAP, GERD, anxiety, morbid obesity, esophageal dysmotility with gastric bypass 2024, failure to thrive with G-tube placement, sacral wound, ESBL UTI recently admitted from 11/25 - 12/11 for perforated ulcer at gastrojejunal anastomosis with anterior side anastomosis repaired with [name] patch   OT comments  The pt was assisted into sitting edge of bed where she presented with good sitting balance. She was instructed on simple therapeutic exercises for strengthening needed to facilitate progressive ADL performance. She further required min assist to stand using a RW; she gently deferred attempting transfer to the chair, OT further educated her on compensatory techniques and equipment recommendations for performing self-care tasks (see ADL section below). Short-term SNF rehab is recommended, however this may not be an option for the pt. As such, she expressed an interest in receiving a bedside commode and rollator for the home. Continue OT plan of care.       If plan is discharge home, recommend the following:  Assistance with cooking/housework;Assist for transportation;Help with stairs or ramp for entrance;A lot of help with bathing/dressing/bathroom   Equipment Recommendations  BSC/3in1;Other (comment) (Patient also inquired about receiving a rollator)    Recommendations for Other Services      Precautions / Restrictions Precautions Precautions: Fall Precaution Comments: G tube Restrictions Weight Bearing Restrictions Per Provider Order: No       Mobility Bed Mobility Overal bed  mobility: Needs Assistance Bed Mobility: Supine to Sit     Supine to sit: Supervision, Used rails, HOB elevated          Transfers Overall transfer level: Needs assistance Equipment used: Rolling walker (2 wheels) Transfers: Sit to/from Stand Sit to Stand: Min assist, From elevated surface                     ADL either performed or assessed with clinical judgement     Vision Baseline Vision/History: 1 Wears glasses            Cognition Arousal: Alert Behavior During Therapy: WFL for tasks assessed/performed Overall Cognitive Status: Within Functional Limits for tasks assessed                        Pertinent Vitals/ Pain       Pain Assessment Pain Location: She denied having pain however reported slight discomfort at G tube site. Pain Intervention(s): Monitored during session         Frequency  Min 1X/week        Progress Toward Goals  OT Goals(current goals can now be found in the care plan section)  Progress towards OT goals: Progressing toward goals  Acute Rehab OT Goals OT Goal Formulation: With patient Time For Goal Achievement: 05/04/23 Potential to Achieve Goals: Good  Plan         AM-PAC OT "6 Clicks" Daily Activity     Outcome Measure   Help from another person eating meals?: None Help from another person taking care of personal grooming?: A Little Help from another person toileting, which includes using toliet, bedpan, or urinal?: A Lot Help from another person bathing (including washing, rinsing, drying)?: A  Lot Help from another person to put on and taking off regular upper body clothing?: A Little Help from another person to put on and taking off regular lower body clothing?: A Lot 6 Click Score: 16    End of Session Equipment Utilized During Treatment: Rolling walker (2 wheels)  OT Visit Diagnosis: Unsteadiness on feet (R26.81);Muscle weakness (generalized) (M62.81);Other abnormalities of gait and mobility  (R26.89)   Activity Tolerance Patient tolerated treatment well   Patient Left in bed;with call bell/phone within reach;with chair alarm set   Nurse Communication Other (comment) (tech informed pt requesting a bed pan)        Time: 4010-2725 OT Time Calculation (min): 28 min  Charges: OT General Charges $OT Visit: 1 Visit OT Treatments $Self Care/Home Management : 8-22 mins $Therapeutic Activity: 8-22 mins     Reuben Likes, OTR/L 04/20/2023, 1:18 PM

## 2023-04-20 NOTE — Plan of Care (Signed)
  Problem: Education: Goal: Knowledge of General Education information will improve Description: Including pain rating scale, medication(s)/side effects and non-pharmacologic comfort measures Outcome: Progressing   Problem: Safety: Goal: Ability to remain free from injury will improve Outcome: Progressing   

## 2023-04-20 NOTE — Progress Notes (Addendum)
[facility]                                                                              Lucill Abrahamsen, is a 72 y.o. female, DOB - 1950-11-15, UJW:[id number] Admit date - 04/10/2023    Outpatient Primary MD for the patient is Pcp, No  LOS - 9  days  No chief complaint on file.      Brief summary   Patient is a 72 year old with HTN, HLD, OSA on CPAP, GERD, anxiety, morbid obesity, esophageal dysmotility with gastric bypass 2024, FTT with G-tube placement, sacral wound, ESBL UTI recently admitted from 11/25 - 12/11 for perforated ulcer at gastrojejunal anastomosis with anterior side anastomosis repaired with [name] patch and discharged to SNF.  Over past 3 days she has developed more abdominal pain located at the tube insertion site.  CT abdomen pelvis shows percutaneous G-tube with balloon in the subcutaneous fat.  Concerning of filling defect in the left superficial femoral vein concerning for DVT which is confirmed on the ultrasound. IR replaced G-tube on 12/16.  Heparin drip transitioned to p.o. eliquis, awaiting SNF    Assessment & Plan     Dislodged gastrostomy tube s/p recent perforated marginal ulcer -IR replaced G-tube on 12/16, dietitian following.   -Plan to slowly uptitrate her oral intake and discontinue G-tube in future once caloric intake is optimal -Continue pain control -Continue wet-to-dry dressing twice daily for the open abdominal midline wound -Patient now plan to go home, question regarding continuous tube feeding versus intermittent nightly tube feeds versus cans.  Dietitian consulted.  If plan for continuous versus intermittent nightly tube feeds, will need daily feeding pump.  If cans recommended, will need to start here to assess tolerance.    Acute DVT in the left common femoral vein -Venous Dopplers 12/16 showed acute DVT in the left common femoral vein -CTA chest negative for PE -Started on Eliquis -H&H stable, no bleeding  General  Anxiety Disorder -Currently stable, no acute issues, not on Cymbalta   Non-anion gap metabolic acidosis -Stable, no acute issues   Hypomagnesemia -Was replaced on 12/21    Normocytic Anemia -Stable, H&H close to baseline -Anemia profile showed Fe 45, TIBC 179, ferritin 206 folate 10.8, B12 951   Gout -Not taking Colchicine   GERD/Gastritis/GI Prophylaxis -Continue Protonix, Carafate  Diarrhea -Improving, continue Tucks pad and lidocaine cream   Generalized debility, wheelchair-bound, gait instability -Prior to this admission, patient was at [facility] for 5 months.  Before that, she has lived independently alone, however with significant debility, ongoing issues, she is wheelchair-bound able to transfer, not ambulating.  Does not have any family support, sister, Muslimah, Hassel POA lives in [place].  She also has open wound that requires wet-to-dry dressing daily.   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   Weight as of this encounter: 98.8 kg.  Code Status: DNR DVT Prophylaxis:   apixaban (ELIQUIS) tablet 5 mg   Level of Care: Level of care: Telemetry Family Communication: Updated patient's sister yesterday on 12/24 and phone Disposition Plan:      Remains inpatient appropriate: Notified by [facility] that patient's insurance had denied SNF and requesting peer to peer.  Planning on peer to peer today.  Completed peer to peer with insurance on 12/24 -Per insurance physician reviewer, she has exhausted 97/100  days of SNF. She can appeal it however she will be only approved for 3 days of skilled care and subsequent stay in SNF will be paid out-of-pocket.  She needs 60 days out of SNF/hospital etc. to reset this.  -Per patient, she does not have Medicaid, plan to go home with home health PT OT, RN, home health aide and neighbor can help.   -DME rollator with seat, 3n1, bedside commode, shower stool, feeding pump ordered.  Per patient she will need to be home to receive all the above DME's when they are delivered.  At this time, tube feeding issue needs to be addressed.  Procedures:  G-tube replaced on 12/16  Consultants:   Interventional radiology General Surgery  Antimicrobials:   Anti-infectives (From admission, onward)    None          Medications  apixaban  5 mg Per Tube BID   ascorbic acid  500 mg Per Tube Daily   cholecalciferol  5,000 Units Per Tube Daily   copper  2 mg Per Tube Daily   feeding supplement (PROSource TF20)  60 mL Per Tube Daily   ferrous sulfate  300 mg Per Tube Daily   lidocaine   Topical BID   multivitamin with minerals  1 tablet Per Tube Daily   pantoprazole (PROTONIX) IV  40 mg Intravenous QHS   sucralfate  1 g Per Tube TID WC & HS      Subjective:   Yianna Amburgy was seen and examined today.  No acute abdominal pain, tolerating tube feeds well inpatient.  Discussed outpatient regimen however patient has been in SNF and does not know whether she will be able to tolerate cans versus continuous/intermittent tube feeds.    Objective:   Vitals:   04/19/23 2010 04/20/23 0404 04/20/23 0500 04/20/23 1157  BP: 132/60 137/66  (!) 143/69  Pulse: (!) 106 (!) 101  96  Resp: 15 18  18   Temp: 98.8 F (37.1 C) 98.6 F (37 C)  98.4 F (36.9 C)  TempSrc: Oral Oral  Oral  SpO2: 98% 99%  99%  Weight:   98.8 kg   Height:        Intake/Output Summary (Last 24 hours) at 04/20/2023 1350 Last data filed at 04/19/2023 1800 Gross per 24 hour  Intake 240 ml   Output 500 ml  Net -260 ml     Wt Readings from Last 3 Encounters:  04/20/23 98.8 kg  04/06/23 105.9 kg  02/03/23 103 kg   Physical Exam General: Alert and oriented x 3, NAD Cardiovascular: S1 S2 clear, RRR.  Respiratory: CTAB, no wheezing Gastrointestinal: Soft, PEG tube, open midline incision wound Ext: no pedal edema bilaterally Neuro: no new deficits Psych: Normal affect         Data Reviewed:  I have personally reviewed following labs    CBC Lab Results  Component Value Date  WBC 7.1 04/20/2023   RBC 2.72 (L) 04/20/2023   HGB 8.2 (L) 04/20/2023   HCT 26.3 (L) 04/20/2023   MCV 96.7 04/20/2023   MCH 30.1 04/20/2023   PLT 297 04/20/2023   MCHC 31.2 04/20/2023   RDW 17.8 (H) 04/20/2023   LYMPHSABS 2.4 04/16/2023   MONOABS 0.5 04/16/2023   EOSABS 0.3 04/16/2023   BASOSABS 0.1 04/16/2023     Last metabolic panel Lab Results  Component Value Date   NA 137 04/20/2023   K 3.8 04/20/2023   CL 107 04/20/2023   CO2 20 (L) 04/20/2023   BUN 21 04/20/2023   CREATININE 0.54 04/20/2023   GLUCOSE 109 (H) 04/20/2023   GFRNONAA >60 04/20/2023   GFRAA >60 02/28/2019   CALCIUM 8.6 (L) 04/20/2023   PHOS 3.2 04/16/2023   PROT 6.2 (L) 04/16/2023   ALBUMIN 2.0 (L) 04/16/2023   LABGLOB 3.8 07/26/2017   BILITOT 0.5 04/16/2023   ALKPHOS 121 04/16/2023   AST 46 (H) 04/16/2023   ALT 27 04/16/2023   ANIONGAP 10 04/20/2023    CBG (last 3)  Recent Labs    04/20/23 0401 04/20/23 0809 04/20/23 1158  GLUCAP 104* 92 120*      Coagulation Profile: No results for input(s): "INR", "PROTIME" in the last 168 hours.   Radiology Studies: I have personally reviewed the imaging studies  No results found.     Thad Ranger M.D. [facility] 04/20/2023, 1:50 PM  Available via Epic secure chat 7am-7pm After 7 pm, please refer to night coverage provider listed on amion.

## 2023-04-21 DIAGNOSIS — R627 Adult failure to thrive: Secondary | ICD-10-CM | POA: Diagnosis not present

## 2023-04-21 DIAGNOSIS — T85528A Displacement of other gastrointestinal prosthetic devices, implants and grafts, initial encounter: Secondary | ICD-10-CM | POA: Diagnosis not present

## 2023-04-21 DIAGNOSIS — F411 Generalized anxiety disorder: Secondary | ICD-10-CM | POA: Diagnosis not present

## 2023-04-21 DIAGNOSIS — I824Y9 Acute embolism and thrombosis of unspecified deep veins of unspecified proximal lower extremity: Secondary | ICD-10-CM | POA: Diagnosis not present

## 2023-04-21 LAB — GLUCOSE, CAPILLARY
Glucose-Capillary: 115 mg/dL — ABNORMAL HIGH (ref 70–99)
Glucose-Capillary: 97 mg/dL (ref 70–99)
Glucose-Capillary: 108 mg/dL — ABNORMAL HIGH (ref 70–99)

## 2023-04-21 MED ORDER — GLUCERNA SHAKE PO LIQD
237.0000 mL | Freq: Three times a day (TID) | ORAL | Status: DC
  Administered 2023-04-21 – 2023-04-22 (×3): 237 mL via ORAL
  Filled 2023-04-21 (×5): qty 237

## 2023-04-21 NOTE — Progress Notes (Signed)
[facility]                                                                              Christine [name], is a 72 y.o. female, DOB - 1950-08-10, QIO:[id number] Admit date - 04/10/2023    Outpatient Primary MD for the patient is Pcp, No  LOS - 10  days  No chief complaint on file.      Brief summary   Patient is a 72 year old with HTN, HLD, OSA on CPAP, GERD, anxiety, morbid obesity, esophageal dysmotility with gastric bypass 2024, FTT with G-tube placement, sacral wound, ESBL UTI recently admitted from 11/25 - 12/11 for perforated ulcer at gastrojejunal anastomosis with anterior side anastomosis repaired with [name] patch and discharged to SNF.  Over past 3 days she has developed more abdominal pain located at the tube insertion site.  CT abdomen pelvis shows percutaneous G-tube with balloon in the subcutaneous fat.  Concerning of filling defect in the left superficial femoral vein concerning for DVT which is confirmed on the ultrasound. IR replaced G-tube on 12/16.  Heparin drip transitioned to p.o. eliquis, awaiting SNF    Assessment & Plan     Dislodged gastrostomy tube s/p recent perforated marginal ulcer History of esophageal dysmotility with gastric bypass in May 2024 -IR replaced G-tube on 12/16,  -Per patient, her surgeon's plan was to slowly uptitrate her oral intake and discontinue G-tube in 2 months once caloric intake is optimal -Continue pain control -Continue wet-to-dry dressing twice daily for the open abdominal midline wound -Patient now plan to go home, question regarding continuous tube feeding versus intermittent nightly tube feeds versus cans.  -Dietitian consulted, patient has been eating regular diet, may not even need tube feeds.  Will do calorie count for 48 hours.  If optimal, she can discontinue tube feeds but leave the PEG tube in as was recommended by her surgeon until follow-up.       Acute DVT in the left common femoral vein -Venous  Dopplers 12/16 showed acute DVT in the left common femoral vein -CTA chest negative for PE -Started on Eliquis -H&H stable, no bleeding  General Anxiety Disorder -Currently stable, no acute issues, not on Cymbalta   Non-anion gap metabolic acidosis -Stable, no acute issues   Hypomagnesemia -Was replaced on 12/21    Normocytic Anemia -Stable, H&H close to baseline -Anemia profile showed Fe 45, TIBC 179, ferritin 206 folate 10.8, B12 951   Gout -Not taking Colchicine   GERD/Gastritis/GI Prophylaxis -Continue Protonix, Carafate  Diarrhea -Improving, continue Tucks pad and lidocaine cream   Generalized debility, wheelchair-bound, gait instability -Prior to this admission, patient was at [facility] for 5 months.  Before that, she has lived independently alone, however with significant debility, ongoing issues, she is wheelchair-bound able to transfer, not ambulating.  Does not have any family support, sister, Kenidy, Sandal POA lives in [place].  She also has open wound that requires wet-to-dry dressing daily.   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   Weight as of this encounter: 98.5 kg.  Code Status: DNR DVT Prophylaxis:   apixaban (ELIQUIS) tablet 5 mg   Level of Care: Level of care: Med-Surg Family Communication: Updated patient's sister yesterday on 12/24 and phone Disposition Plan:      Remains inpatient appropriate: Notified by [facility] that patient's insurance had denied SNF and requesting peer to  peer.  Planning on peer to peer today.  Completed peer to peer with insurance on 12/24 -Per insurance physician reviewer, she has exhausted 97/100 days of SNF. She can appeal it however she will be only approved for 3 days of skilled care and subsequent stay in SNF will be paid out-of-pocket.  She needs 60 days out of SNF/hospital etc. to reset this.  -Per patient, she does not have Medicaid, plan to go home with home health PT OT, RN, home health aide and neighbor can help.   -DME rollator with seat, 3n1, bedside commode, shower stool -  At this time, tube feeding issue needs to be addressed.  Procedures:  G-tube replaced on 12/16  Consultants:   Interventional radiology General Surgery  Antimicrobials:   Anti-infectives (From admission, onward)    None          Medications  apixaban  5 mg Per Tube BID   ascorbic acid  500 mg Per Tube Daily   cholecalciferol  5,000 Units Per Tube Daily   copper  2 mg Per Tube Daily   feeding supplement (GLUCERNA SHAKE)  237 mL Oral TID BM   ferrous sulfate  300 mg Per Tube Daily   lidocaine   Topical BID   multivitamin with minerals  1 tablet Per Tube Daily   pantoprazole (PROTONIX) IV  40 mg Intravenous QHS   sucralfate  1 g Per Tube TID WC & HS      Subjective:   Chestine Theriault was seen and examined today.  No acute abdominal pain, feeling better, no acute issues overnight, no nausea vomiting fevers or chills.  On tube feeds.,  Eating regular diet  Objective:   Vitals:   04/20/23 2005 04/21/23 0449 04/21/23 0500 04/21/23 1152  BP: (!) 147/60 (!) 141/62  133/63  Pulse: (!) 105 (!) 103  100  Resp: 18 20    Temp: 98.4 F (36.9 C) 99 F (37.2 C)  98 F (36.7 C)  TempSrc: Oral Oral  Oral  SpO2: 98% 100%  99%  Weight:   98.5 kg   Height:        Intake/Output Summary (Last 24 hours) at 04/21/2023 1507 Last data filed at 04/21/2023 0754 Gross per 24 hour  Intake 120 ml  Output 400 ml  Net -280 ml     Wt Readings  from Last 3 Encounters:  04/21/23 98.5 kg  04/06/23 105.9 kg  02/03/23 103 kg    Physical Exam General: Alert and oriented x 3, NAD Cardiovascular: S1 S2 clear, RRR.  Respiratory: CTAB, no wheezing Gastrointestinal: Soft, nontender, nondistended, NBS, PEG tube, abdominal wound, dressing intact Ext: no pedal edema bilaterally Neuro: no new deficits Psych: Normal affect         Data Reviewed:  I have personally reviewed following labs    CBC Lab Results  Component  Value Date   WBC 7.1 04/20/2023   RBC 2.72 (L) 04/20/2023   HGB 8.2 (L) 04/20/2023   HCT 26.3 (L) 04/20/2023   MCV 96.7 04/20/2023   MCH 30.1 04/20/2023   PLT 297 04/20/2023   MCHC 31.2 04/20/2023   RDW 17.8 (H) 04/20/2023   LYMPHSABS 2.4 04/16/2023   MONOABS 0.5 04/16/2023   EOSABS 0.3 04/16/2023   BASOSABS 0.1 04/16/2023     Last metabolic panel Lab Results  Component Value Date   NA 137 04/20/2023   K 3.8 04/20/2023   CL 107 04/20/2023   CO2 20 (L) 04/20/2023   BUN 21 04/20/2023   CREATININE 0.54 04/20/2023   GLUCOSE 109 (H) 04/20/2023   GFRNONAA >60 04/20/2023   GFRAA >60 02/28/2019   CALCIUM 8.6 (L) 04/20/2023   PHOS 3.2 04/16/2023   PROT 6.2 (L) 04/16/2023   ALBUMIN 2.0 (L) 04/16/2023   LABGLOB 3.8 07/26/2017   BILITOT 0.5 04/16/2023   ALKPHOS 121 04/16/2023   AST 46 (H) 04/16/2023   ALT 27 04/16/2023   ANIONGAP 10 04/20/2023    CBG (last 3)  Recent Labs    04/21/23 0446 04/21/23 0746 04/21/23 1150  GLUCAP 115* 97 108*      Coagulation Profile: No results for input(s): "INR", "PROTIME" in the last 168 hours.   Radiology Studies: I have personally reviewed the imaging studies  No results found.     Thad Ranger M.D. [facility] 04/21/2023, 3:07 PM  Available via Epic secure chat 7am-7pm After 7 pm, please refer to night coverage provider listed on amion.

## 2023-04-21 NOTE — Plan of Care (Signed)
  Problem: Education: Goal: Knowledge of General Education information will improve Description: Including pain rating scale, medication(s)/side effects and non-pharmacologic comfort measures Outcome: Progressing   Problem: Pain Management: Goal: General experience of comfort will improve Outcome: Progressing   Problem: Safety: Goal: Ability to remain free from injury will improve Outcome: Progressing

## 2023-04-21 NOTE — Progress Notes (Signed)
Mobility Specialist - Progress Note   04/21/23 1500  Mobility  Activity Transferred from bed to chair;Ambulated with assistance in room  Level of Assistance Contact guard assist, steadying assist  Assistive Device Front wheel walker  Distance Ambulated (ft) 10 ft  Range of Motion/Exercises Active  Activity Response Tolerated well  Mobility Referral Yes  Mobility visit 1 Mobility  Mobility Specialist Start Time (ACUTE ONLY) 1430  Mobility Specialist Stop Time (ACUTE ONLY) 1445  Mobility Specialist Time Calculation (min) (ACUTE ONLY) 15 min   Received in bed and agreed to mobility. After convincing to sit in wheelchair to be transported to other unit pt agreed. Standby for bed mobility, contact for STS and short ambulation to doorway where wheelchair was placed. Left in chair with all needs met and with NT.  Marilynne Halsted Mobility Specialist

## 2023-04-21 NOTE — Plan of Care (Signed)
  Problem: Coping: Goal: Level of anxiety will decrease Outcome: Progressing   Problem: Pain Management: Goal: General experience of comfort will improve Outcome: Progressing   Problem: Safety: Goal: Ability to remain free from injury will improve Outcome: Progressing   Problem: Skin Integrity: Goal: Risk for impaired skin integrity will decrease Outcome: Progressing

## 2023-04-21 NOTE — Progress Notes (Signed)
Nutrition Follow-up  DOCUMENTATION CODES:   Morbid obesity  INTERVENTION:  - 48 hour calorie count to start today at lunch and run through 12/28 after breakfast.  - Please document % intake of all foods, drinks, and nutrition supplements patient consumes on meal tickets and place in envelope on patient's door.  - If patient skips/refuses meals please document 0% for that meal.  - Discussed with RN.  - Discontinue tube feeds while running calore count.   - 2 gram Sodium diet per MD.  - Add Glucerna Shake po TID, each supplement provides 220 kcal and 10 grams of protein  - Continue daily Multivitamin with minerals, 5000 units vitamin D, 2mg  copper (for deficiency), 500mg  vitamin C.    - Monitor weight trends.    NUTRITION DIAGNOSIS:   Inadequate oral intake related to acute illness, early satiety as evidenced by energy intake < 75% for > 7 days (oral). *new  GOAL:   Patient will meet greater than or equal to 90% of their needs *progressing  MONITOR:   PO intake, Supplement acceptance, Weight trends  REASON FOR ASSESSMENT:   Consult Enteral/tube feeding initiation and management  ASSESSMENT:   72 y.o. with history of HTN, HLD, OSA on CPAP, GERD, anxiety, morbid obesity, esophageal dysmotility with gastric bypass (May 2024), failure to thrive with G-tube placement, sacral wound, ESBL UTI recently admitted from 11/25 - 12/11 for perforated ulcer at gastrojejunal anastomosis with anterior side anastomosis repaired with [name] patch and discharged to SNF. 3 days PTA developed more abdominal pain located at the tube insertion site. Admitted for dislodged gastrostomy tube.  12/15: Admit 12/16: IR replaced G-tube 12/17: TF's started  RD working remotely. Called patient via bedside telephone.   She reports tolerating tube feeds well. She does not feel like they are affecting her appetite.   She reports not eating big meals during the day but snacks all throughout the day.  States she has tolerated that better since her gastric bypass surgery.  Her biggest meal is at dinner, which she usually orders take out for. She also reports snacking all during the evening/night as well. She is documented to be consuming 0-100% of meals over the past week.  RD consulted to determine tube feed plan as patient approaching possible discharge soon.   Will plan to stop tube feeds today and run a 48 hour calorie count to assess patient's oral intake and determine need for tube feeds on discharge. Discussed plan with MD.  Patient agreeable for plan and feels she will be able to eat well and hopefully not need tube feeds. She is agreeable to start receiving Glucerna to support oral intake.   Discussed plan for calorie count with RN. RN to hang calorie count envelope on door.   Admit weight: 220# Current weight: 217#  Medications reviewed and include: 500mg  vitamin C, 5000 units vitamin D daily, 2mg  copper (ends 1/15), 300mg  ferrous sulfate, MVI  Labs reviewed:  - HA1C 6.3 Blood Glucose 83-126 x24 hours   Diet Order:   Diet Order             Diet 2 gram sodium Room service appropriate? Yes; Fluid consistency: Thin  Diet effective now                   EDUCATION NEEDS:  Education needs have been addressed  Skin:  Skin Assessment: Skin Integrity Issues: Skin Integrity Issues:: Other (Comment) Other: Irritant Dermatitis (Moisture Associated Skin Damage) - Left & Right Groin  Last BM:  12/25 - type 5  Height:  Ht Readings from Last 1 Encounters:  04/11/23 5\' 1"  (1.549 m)   Weight:  Wt Readings from Last 1 Encounters:  04/21/23 98.5 kg   Ideal Body Weight:  47.73 kg  BMI:  Body mass index is 41.03 kg/m.  Estimated Nutritional Needs:  Kcal:  1500-1800 kcals Protein:  80-105 grams Fluid:  >/= 1.5L    Shelle Iron RD, LDN Contact via Secure Chat.

## 2023-04-22 ENCOUNTER — Other Ambulatory Visit (HOSPITAL_COMMUNITY): Payer: Self-pay

## 2023-04-22 DIAGNOSIS — F411 Generalized anxiety disorder: Secondary | ICD-10-CM | POA: Diagnosis not present

## 2023-04-22 DIAGNOSIS — T85528A Displacement of other gastrointestinal prosthetic devices, implants and grafts, initial encounter: Secondary | ICD-10-CM | POA: Diagnosis not present

## 2023-04-22 DIAGNOSIS — M109 Gout, unspecified: Secondary | ICD-10-CM | POA: Diagnosis not present

## 2023-04-22 DIAGNOSIS — R627 Adult failure to thrive: Secondary | ICD-10-CM | POA: Diagnosis not present

## 2023-04-22 LAB — CBC
HCT: 27.4 % — ABNORMAL LOW (ref 36.0–46.0)
Hemoglobin: 8.3 g/dL — ABNORMAL LOW (ref 12.0–15.0)
MCH: 29.7 pg (ref 26.0–34.0)
MCHC: 30.3 g/dL (ref 30.0–36.0)
MCV: 98.2 fL (ref 80.0–100.0)
Platelets: 320 10*3/uL (ref 150–400)
RBC: 2.79 MIL/uL — ABNORMAL LOW (ref 3.87–5.11)
RDW: 17.4 % — ABNORMAL HIGH (ref 11.5–15.5)
WBC: 7 10*3/uL (ref 4.0–10.5)
nRBC: 0 % (ref 0.0–0.2)

## 2023-04-22 LAB — BASIC METABOLIC PANEL
Anion gap: 8 (ref 5–15)
BUN: 25 mg/dL — ABNORMAL HIGH (ref 8–23)
CO2: 22 mmol/L (ref 22–32)
Calcium: 8.8 mg/dL — ABNORMAL LOW (ref 8.9–10.3)
Chloride: 107 mmol/L (ref 98–111)
Creatinine, Ser: 0.75 mg/dL (ref 0.44–1.00)
GFR, Estimated: >60 mL/min (ref >60)
Glucose, Bld: 85 mg/dL (ref 70–99)
Potassium: 4.1 mmol/L (ref 3.5–5.1)
Sodium: 137 mmol/L (ref 135–145)

## 2023-04-22 MED ORDER — FUROSEMIDE 20 MG PO TABS
20.0000 mg | ORAL_TABLET | Freq: Every day | ORAL | 0 refills | Status: DC
  Filled 2023-04-22: qty 30, 30d supply, fill #0

## 2023-04-22 MED ORDER — SENNOSIDES-DOCUSATE SODIUM 8.6-50 MG PO TABS: 1.0000 | ORAL_TABLET | Freq: Two times a day (BID) | ORAL | Status: DC | PRN

## 2023-04-22 MED ORDER — POLYETHYLENE GLYCOL 3350 17 GM/SCOOP PO POWD
17.0000 g | Freq: Two times a day (BID) | ORAL | 1 refills | Status: AC | PRN
  Filled 2023-04-22: qty 238, 7d supply, fill #0

## 2023-04-22 MED ORDER — PANTOPRAZOLE SODIUM 40 MG PO TBEC
40.0000 mg | DELAYED_RELEASE_TABLET | Freq: Every day | ORAL | 0 refills | Status: DC
  Filled 2023-04-22: qty 30, 30d supply, fill #0

## 2023-04-22 MED ORDER — GLUCERNA SHAKE PO LIQD
237.0000 mL | ORAL | 0 refills | Status: AC
  Filled 2023-04-22: qty 7110, 30d supply, fill #0

## 2023-04-22 MED ORDER — APIXABAN 5 MG PO TABS
5.0000 mg | ORAL_TABLET | Freq: Two times a day (BID) | ORAL | 3 refills | Status: DC
  Filled 2023-04-22: qty 60, 30d supply, fill #0
  Filled 2023-06-17 – 2023-06-18 (×4): qty 60, 30d supply, fill #1

## 2023-04-22 MED ORDER — COPPER 2 MG PO TABS: 2.0000 mg | Freq: Every day | Status: DC

## 2023-04-22 MED ORDER — VITAMIN D3 25 MCG PO TABS: 2000.0000 [IU] | ORAL_TABLET | Freq: Every day | ORAL | Status: DC

## 2023-04-22 MED ORDER — VITAMIN B-1 100 MG PO TABS
100.0000 mg | ORAL_TABLET | Freq: Every day | ORAL | 0 refills | Status: AC
  Filled 2023-04-22: qty 30, 30d supply, fill #0

## 2023-04-22 MED ORDER — ASCORBIC ACID 500 MG PO TABS: 500.0000 mg | ORAL_TABLET | Freq: Every day | ORAL | Status: DC

## 2023-04-22 MED ORDER — ADULT MULTIVITAMIN W/MINERALS CH: 1.0000 | ORAL_TABLET | Freq: Every day | ORAL | Status: AC

## 2023-04-22 MED ORDER — COLCHICINE 0.6 MG PO TABS
0.6000 mg | ORAL_TABLET | Freq: Every day | ORAL | 0 refills | Status: DC
  Filled 2023-04-22: qty 30, 30d supply, fill #0

## 2023-04-22 MED ORDER — FERROUS SULFATE 325 (65 FE) MG PO TBEC: 325.0000 mg | DELAYED_RELEASE_TABLET | Freq: Two times a day (BID) | ORAL | Status: DC

## 2023-04-22 NOTE — Discharge Summary (Signed)
Physician Discharge Summary  Christine [name] Cichy ZOX:[id number] DOB: [birth year] DOA: 04/10/2023  PCP: Pcp, No  Admit date: 04/10/2023 Discharge date: 04/22/2023 Admitted From: SNF Disposition: Home Recommendations for Outpatient Follow-up:  Follow up with PCP in 1 to 2 weeks Outpatient follow-up with general surgery as previously planned Check CMP and CBC at follow-up Please follow up on the following pending results: None  Home Health: [facility] PT/OT/RN/aide Equipment/Devices: Rollator, bedside commode and shower stool  Discharge Condition: Stable CODE STATUS: DNR/DNI  Contact information for follow-up providers     [facility] Follow up.   Specialty: Home Health Services Why: [name]  Home Health Physical and Occupational Therapy, Skilled Nursing (RN), Home Health Aide Contact information: [address] [place] [address] [phone]              Contact information for after-discharge care     Destination     [facility] .   Service: Skilled Nursing Contact information: [address] [place] [address] [phone]                     Hospital course 72 year old with HTN, HLD, OSA on CPAP, GERD, anxiety, morbid obesity, esophageal dysmotility with gastric bypass 2024, FTT with G-tube placement, sacral wound, ESBL UTI recently admitted from 11/25 - 12/11 for perforated ulcer at gastrojejunal anastomosis with anterior side anastomosis repaired with [name] patch and discharged to SNF.  She presented to ED with 3 days of abdominal pain around the G-tube.  CT abdomen pelvis shows percutaneous G-tube with balloon in the subcutaneous fat.  CT also concerning for filling defect in the left superficial femoral vein concerning for DVT which is confirmed on the ultrasound.  IR replaced G-tube on 04/11/2023.  Patient was started on heparin drip for DVT and  transition to p.o. Eliquis.  Therapy recommended SNF but this was denied even after peer to peer review since patient was at SNF for about 5 months previously.  Patient is discharged home with home health and DME as above.  Patient did not require G-tube feeding and was able to maintain good oral intake.   See individual problem list below for more.   Problems addressed during this hospitalization Dislodged gastrostomy tube s/p recent perforated marginal ulcer History of esophageal dysmotility with gastric bypass in May 2024 -IR replaced G-tube on 12/16,  -Per patient, her surgeon's plan was to slowly uptitrate her oral intake and discontinue G-tube in 2 months -Patient seems to have good p.o. intake and did not need her G-tube feeding      Acute DVT in the left common femoral vein: Venous Dopplers 12/16 showed acute DVT in the left common femoral vein.  CT angio chest negative for PE. -Continue Eliquis.   General Anxiety Disorder: Currently stable, no acute issues, not on Cymbalta   Non-anion gap metabolic acidosis: Resolved.   Hypomagnesemia: Resolved.     Normocytic Anemia: No nutritional deficiency on anemia panel.  Stable.   Gout -Continue home colchicine.   GERD/Gastritis/GI Prophylaxis -Continue Protonix   Diarrhea: Resolved.     Generalized debility, wheelchair-bound, gait instability: Was at SNF for 5 months. -[facility] PT/OT/RN/aide and DME as above   Morbid obesity Nutrition Problem: Inadequate oral intake Etiology: acute illness, early satiety Signs/Symptoms: energy intake < 75% for > 7 days (oral) Interventions: Other (Comment), Glucerna shake, Calorie Count, Refer to RD note for recommendations  Time spent 35 minutes  Vital signs Vitals:   04/21/23 2128 04/22/23 0500 04/22/23 0625 04/22/23 1333  BP: 138/68  129/68 (!) 132/58  Pulse: 97  96 91  Temp: 98.7 F (37.1 C)  97.7 F (36.5 C) 97.9 F (36.6 C)  Resp: 20  17 18   Height:      Weight:  102 kg     SpO2: 100%  100% 100%  TempSrc: Oral  Oral Oral  BMI (Calculated):  42.51       Discharge exam  GENERAL: No apparent distress.  Nontoxic. HEENT: MMM.  Vision and hearing grossly intact.  NECK: Supple.  No apparent JVD.  RESP:  No IWOB.  Fair aeration bilaterally. CVS:  RRR. Heart sounds normal.  ABD/GI/GU: BS+. Abd soft, NTND.  MSK/EXT:  Moves extremities. No apparent deformity. No edema.  SKIN: no apparent skin lesion or wound NEURO: Awake and alert. Oriented appropriately.  No apparent focal neuro deficit. PSYCH: Calm. Normal affect.   Discharge Instructions Discharge Instructions     Diet - low sodium heart healthy   Complete by: As directed    Discharge instructions   Complete by: As directed    It has been a pleasure taking care of you!  You were hospitalized due to dislodged gastrostomy tube and blood clot.  Gastrostomy tube has been replaced.  You have been started on blood thinner for blood clot.  It is very important that you take this medicine as prescribed.  Follow-up with your primary care doctor in 1 to 2 weeks or sooner if needed.  Follow-up with your surgeon as previously planned.  Please review your new medication list and the directions on your medications before you take them.   Take care,   Increase activity slowly   Complete by: As directed    No wound care   Complete by: As directed    Wet-to-dry dressing once daily      Allergies as of 04/22/2023       Reactions   Prednisone Swelling   Legs swell    Tape Hives   Sulfa Antibiotics Rash        Medication List     STOP taking these medications    bacitracin 500 UNIT/GM ointment   DULoxetine 60 MG capsule Commonly known as: CYMBALTA   folic acid 1 MG tablet Commonly known as: FOLVITE   gabapentin 100 MG capsule Commonly known as: NEURONTIN   omeprazole 20 MG capsule Commonly known as: PRILOSEC   promethazine 25 MG tablet Commonly known as: PHENERGAN   sucralfate 1 GM/10ML  suspension Commonly known as: CARAFATE   UNABLE TO FIND   zinc gluconate 50 MG tablet       TAKE these medications    acetaminophen 325 MG tablet Commonly known as: TYLENOL Take 650 mg by mouth every 4 (four) hours as needed for mild pain or moderate pain.   ascorbic acid 500 MG tablet Commonly known as: VITAMIN C Place 1 tablet (500 mg total) into feeding tube daily. What changed: how to take this   colchicine 0.6 MG tablet Take 1 tablet (0.6 mg total) by mouth daily. What changed: how much to take   copper tablet Place 1 tablet (2 mg total) into feeding tube daily. What changed: how to take this   Eliquis 5 MG Tabs tablet Generic drug: apixaban Place 1 tablet (5 mg total) into feeding tube 2 (two) times daily.   feeding supplement (GLUCERNA SHAKE) Liqd Take 237 mLs  by mouth daily. What changed: Another medication with the same name was removed. Continue taking this medication, and follow the directions you see here.   ferrous sulfate 325 (65 FE) MG EC tablet Take 1 tablet (325 mg total) by mouth in the morning and at bedtime. What changed: when to take this   furosemide 20 MG tablet Commonly known as: LASIX Take 1 tablet (20 mg total) by mouth daily.   multivitamin with minerals Tabs tablet Place 1 tablet into feeding tube daily. What changed: how to take this   ondansetron 4 MG tablet Commonly known as: ZOFRAN Place 1 tablet (4 mg total) into feeding tube every 6 (six) hours as needed for nausea.   pantoprazole 40 MG tablet Commonly known as: Protonix Take 1 tablet (40 mg total) by mouth daily.   polyethylene glycol powder 17 GM/SCOOP powder Commonly known as: MiraLax Take 17 g by mouth 2 (two) times daily as needed for mild constipation.   senna-docusate 8.6-50 MG tablet Commonly known as: Senokot-S Take 1 tablet by mouth 2 (two) times daily between meals as needed for mild constipation.   thiamine 100 MG tablet Commonly known as: VITAMIN B1 Take  1 tablet (100 mg total) by mouth daily.   vitamin D3 25 MCG tablet Commonly known as: CHOLECALCIFEROL Place 2 tablets (2,000 Units total) into feeding tube daily. What changed:  how much to take how to take this               Durable Medical Equipment  (From admission, onward)           Start     Ordered   04/20/23 1355  For home use only DME 3 n 1  Once        04/20/23 1354   04/20/23 1125  For home use only DME Shower stool  Once        04/20/23 1124   04/20/23 1124  For home use only DME 4 wheeled rolling walker with seat  Once       Question:  Patient needs a walker to treat with the following condition  Answer:  Gait instability   04/20/23 1124   04/20/23 1124  For home use only DME Bedside commode  Once       Question:  Patient needs a bedside commode to treat with the following condition  Answer:  Gait instability   04/20/23 1124            Consultations: General surgery Interventional radiology  Procedures/Studies: 12/16-G-tube replacement   CT Angio Chest Pulmonary Embolism (PE) W or WO Contrast Result Date: 04/12/2023 CLINICAL DATA:  Short of breath, history of DVT EXAM: CT ANGIOGRAPHY CHEST WITH CONTRAST TECHNIQUE: Multidetector CT imaging of the chest was performed using the standard protocol during bolus administration of intravenous contrast. Multiplanar CT image reconstructions and MIPs were obtained to evaluate the vascular anatomy. RADIATION DOSE REDUCTION: This exam was performed according to the departmental dose-optimization program which includes automated exposure control, adjustment of the mA and/or kV according to patient size and/or use of iterative reconstruction technique. CONTRAST:  75mL OMNIPAQUE IOHEXOL 350 MG/ML SOLN COMPARISON:  01/15/2023 FINDINGS: Cardiovascular: This is a technically adequate evaluation of the pulmonary vasculature. No filling defects or pulmonary emboli. The heart is enlarged without pericardial effusion. Normal  caliber of the thoracic aorta. Atherosclerosis of the aorta and coronary vasculature. Mediastinum/Nodes: No enlarged mediastinal, hilar, or axillary lymph nodes. Thyroid gland, trachea, and esophagus demonstrate no significant findings. Lungs/Pleura:  There is scattered ground-glass opacities throughout the lungs, favored to represent hypoventilatory changes rather than edema or inflammation/infection. There is dependent left lower lobe atelectasis. No effusion or pneumothorax. Central airways are patent. Upper Abdomen: Hepatic steatosis. Postsurgical changes from bariatric surgery. Musculoskeletal: No acute or destructive bony abnormalities. Reconstructed images demonstrate no additional findings. Review of the MIP images confirms the above findings. IMPRESSION: 1. No evidence of pulmonary embolus. 2. Scattered ground-glass opacities throughout the lungs, favored to represent hypoventilatory changes though edema or inflammation/infection could have a similar appearance. 3. Cardiomegaly. 4. Aortic Atherosclerosis (ICD10-I70.0). Coronary artery atherosclerosis. 5. Hepatic steatosis. Electronically Signed   By: Sharlet Salina M.D.   On: 04/12/2023 08:54   IR Replc Gastro/Colonic Tube Percut W/Fluoro Result Date: 04/12/2023 INDICATION: Gastrostomy tube in gastric remnant post gastric bypass has dislodged into the subcutaneous fat of the abdominal wall. Replacement requested under fluoroscopy. EXAM: REPLACEMENT OF A GASTROSTOMY TUBE UNDER FLUOROSCOPY MEDICATIONS: None ANESTHESIA/SEDATION: None CONTRAST:  15 mL Omnipaque 300-administered into the gastric lumen. FLUOROSCOPY TIME:  Fluoroscopy Time: 1 minute and 18 seconds. 27.0 mGy. COMPLICATIONS: None immediate. PROCEDURE: Informed written consent was obtained from the patient after a thorough discussion of the procedural risks, benefits and alternatives. All questions were addressed. Maximal Sterile Barrier Technique was utilized including caps, mask, sterile gowns,  sterile gloves, sterile drape, hand hygiene and skin antiseptic. A timeout was performed prior to the initiation of the procedure. A pre-existing 16 French balloon retention gastrostomy tube within the abdominal wall subcutaneous fat by CT was removed after deflation of the retention balloon. A 5 French catheter was advanced through the percutaneous tract and into the stomach. Intraluminal position was confirmed by injection of contrast. Over a guidewire, a new 18 French balloon retention gastrostomy tube was then advanced into the stomach. The retention balloon was inflated with 10 mL of saline. Position was confirmed by fluoroscopy after injection of contrast. FINDINGS: A gastrostomy tube was advanced into the stomach and positioned such that the tip is directed into the superior gastric remnant. IMPRESSION: Replacement of dislodged gastrostomy tube. A new 18 French balloon retention gastrostomy tube was advanced into the gastric remnant stomach. Electronically Signed   By: Irish Lack M.D.   On: 04/12/2023 08:08   VAS [place] LOWER EXTREMITY VENOUS (DVT) Result Date: 04/11/2023  Lower Venous DVT Study Patient Name:  KARL STAUNTON  Date of Exam:   04/11/2023 Medical Rec #: [id number]              Accession #:    [id number] Date of Birth: [birth year]             Patient Gender: F Patient Age:   31 years Exam Location:  [facility] Procedure:      VAS [place] LOWER EXTREMITY VENOUS (DVT) Referring Phys: DEBBY CROSLEY --------------------------------------------------------------------------------  Indications: Swelling.  Anticoagulation: Heparin. Limitations: Poor ultrasound/tissue interface and body habitus. Comparison Study: 04/10/2023 - CT ABDOMEN PELVIS W CONTRAST                    IMPRESSION:                   1. Percutaneous gastrostomy tube with balloon in the                   subcutaneous                   fat of the left ventral abdomen. The tip is within the  anterior                    abdominal wall and not in the stomach.                   2. Filling defect in the left common and superficial femoral                   arteries concerning for acute DVT. Ultrasound is recommended                   for                   confirmation.                   3. Slightly decreased gastritis. Decreased surrounding fluid                   and                   stranding. The previous pneumoperitoneum has resolved.                   4. Marked hepatic steatosis.                   5. Trace left pleural effusion.                   6. Postoperative change about the midline anterior abdomen                   with                   small hematomas or seromas. Performing Technologist: Chanda Busing RVT  Examination Guidelines: A complete evaluation includes B-mode imaging, spectral Doppler, color Doppler, and power Doppler as needed of all accessible portions of each vessel. Bilateral testing is considered an integral part of a complete examination. Limited examinations for reoccurring indications may be performed as noted. The reflux portion of the exam is performed with the patient in reverse Trendelenburg.  +---------+---------------+---------+-----------+----------+-------------------+ RIGHT    CompressibilityPhasicitySpontaneityPropertiesThrombus Aging      +---------+---------------+---------+-----------+----------+-------------------+ CFV      Full           Yes      Yes                                      +---------+---------------+---------+-----------+----------+-------------------+ SFJ      Full                                                             +---------+---------------+---------+-----------+----------+-------------------+ FV Prox  Full                                                             +---------+---------------+---------+-----------+----------+-------------------+ FV Mid   Full                                                              +---------+---------------+---------+-----------+----------+-------------------+  FV DistalFull                                                             +---------+---------------+---------+-----------+----------+-------------------+ PFV      Full                                                             +---------+---------------+---------+-----------+----------+-------------------+ POP      Full           Yes      Yes                                      +---------+---------------+---------+-----------+----------+-------------------+ PTV      Full                                                             +---------+---------------+---------+-----------+----------+-------------------+ PERO                                                  Not well visualized +---------+---------------+---------+-----------+----------+-------------------+   +---------+---------------+---------+-----------+----------+--------------+ LEFT     CompressibilityPhasicitySpontaneityPropertiesThrombus Aging +---------+---------------+---------+-----------+----------+--------------+ CFV      Partial        No       No                   Acute          +---------+---------------+---------+-----------+----------+--------------+ SFJ      Full                                                        +---------+---------------+---------+-----------+----------+--------------+ FV Prox  Full                                                        +---------+---------------+---------+-----------+----------+--------------+ FV Mid   Full                                                        +---------+---------------+---------+-----------+----------+--------------+ FV DistalFull                                                        +---------+---------------+---------+-----------+----------+--------------+  PFV      Full                                                         +---------+---------------+---------+-----------+----------+--------------+ POP      Full           Yes      Yes                                 +---------+---------------+---------+-----------+----------+--------------+ PTV      Full                                                        +---------+---------------+---------+-----------+----------+--------------+ PERO     Full                                                        +---------+---------------+---------+-----------+----------+--------------+ EIV                     Yes      Yes                                 +---------+---------------+---------+-----------+----------+--------------+     Summary: RIGHT: - There is no evidence of deep vein thrombosis in the lower extremity. However, portions of this examination were limited- see technologist comments above.  - No cystic structure found in the popliteal fossa.  LEFT: - Findings consistent with acute deep vein thrombosis involving the left common femoral vein.  - No cystic structure found in the popliteal fossa.  *See table(s) above for measurements and observations. Electronically signed by Gerarda Fraction on 04/11/2023 at 6:44:42 PM.    Final    CT ABDOMEN PELVIS W CONTRAST Result Date: 04/10/2023 CLINICAL DATA:  Abdominal pain with nausea. EXAM: CT ABDOMEN AND PELVIS WITH CONTRAST TECHNIQUE: Multidetector CT imaging of the abdomen and pelvis was performed using the standard protocol following bolus administration of intravenous contrast. RADIATION DOSE REDUCTION: This exam was performed according to the departmental dose-optimization program which includes automated exposure control, adjustment of the mA and/or kV according to patient size and/or use of iterative reconstruction technique. CONTRAST:  OMNIPAQUE IOHEXOL 300 MG/ML  SOLN COMPARISON:  CT abdomen and pelvis 03/21/2023 FINDINGS: Lower chest: Trace left pleural effusion. Hepatobiliary: Marked hepatic  steatosis. Cholecystectomy. No biliary dilation. Pancreas: Unremarkable. Spleen: Unremarkable. Adrenals/Urinary Tract: Stable adrenal glands and kidneys. No urinary calculi or hydronephrosis. Unremarkable bladder. Stomach/Bowel: Postoperative change of Roux-en-Y gastric bypass. There is a percutaneous gastrostomy tube with balloon in the subcutaneous fat of the left ventral abdomen. The tip is within the anterior abdominal wall and not in the stomach. Wall thickening and mucosal hyperenhancement about the stomach has slightly decreased from 03/21/2023. Decreased surrounding fluid and stranding. Normal caliber large and small bowel. Vascular/Lymphatic: Filling defects in the left common femoral and superficial femoral veins (circa series  2/image 81) appears similar to 03/21/2023. Mixing artifact can have this appearance however given similarity to 03/21/2023 this is concerning for DVT. Aortic atherosclerosis. No enlarged abdominal or pelvic lymph nodes. Reproductive: Hysterectomy.  No adnexal mass. Other: The previous pneumoperitoneum has resolved. Stranding along the midline abdominal wall incision compatible with postoperative change. Small fluid collections measuring up to 3.1 cm likely postoperative hematomas or seromas. Musculoskeletal: No acute fracture. IMPRESSION: 1. Percutaneous gastrostomy tube with balloon in the subcutaneous fat of the left ventral abdomen. The tip is within the anterior abdominal wall and not in the stomach. 2. Filling defect in the left common and superficial femoral arteries concerning for acute DVT. Ultrasound is recommended for confirmation. 3. Slightly decreased gastritis. Decreased surrounding fluid and stranding. The previous pneumoperitoneum has resolved. 4. Marked hepatic steatosis. 5. Trace left pleural effusion. 6. Postoperative change about the midline anterior abdomen with small hematomas or seromas. Aortic Atherosclerosis (ICD10-I70.0). These results were called by  telephone at the time of interpretation on 04/10/2023 at 10:56 pm to provider [facility] , who verbally acknowledged these results. Electronically Signed   By: Minerva Fester M.D.   On: 04/10/2023 23:08   DG UGI W SINGLE CM (SOL OR THIN BA) Result Date: 03/25/2023 CLINICAL DATA:  [id number] Perforated ulcer (HCC) [id number]. Postoperative. Rule out leak. EXAM: WATER SOLUBLE UPPER GI SERIES TECHNIQUE: Single-column upper GI series was performed using water soluble contrast. Radiation Exposure Index (as provided by the fluoroscopic device): 289.6 mGy Kerma CONTRAST:  100 mL of Omnipaque 300. COMPARISON:  None Available. FLUOROSCOPY: Fluoroscopy Time:  3 minutes FINDINGS: Scout: No free air under the domes of diaphragm. Left lung base and left lateral costophrenic angle appear clear. Patient is status post repair of perforated marginal ulcer. No evidence of leak. There is normal passage of barium from the esophagus into the proximal stomach and from the stomach into the jejunum via gastrojejunostomy anastomosis. No abnormal holdup. No large ulcer seen. The excluded stomach was not opacified, as expected. There is also smooth passage of contrast into the distal jejunum and via jejunojejunostomy. No abnormal holdup. No large ulcer seen. IMPRESSION: *No evidence of leak.  Please see above for details. Electronically Signed   By: Jules Schick M.D.   On: 03/25/2023 11:00       The results of significant diagnostics from this hospitalization (including imaging, microbiology, ancillary and laboratory) are listed below for reference.     Microbiology: No results found for this or any previous visit (from the past 240 hours).   Labs:  CBC: Recent Labs  Lab 04/16/23 0716 04/18/23 0727 04/20/23 0456 04/22/23 0326  WBC 5.5 6.9 7.1 7.0  NEUTROABS 2.2  --   --   --   HGB 8.8* 8.8* 8.2* 8.3*  HCT 29.5* 28.0* 26.3* 27.4*  MCV 98.3 97.6 96.7 98.2  PLT 415* 311 297 320   BMP &GFR Recent Labs  Lab  04/16/23 0716 04/18/23 0727 04/20/23 0456 04/22/23 0326  NA 136 137 137 137  K 4.2 4.0 3.8 4.1  CL 106 107 107 107  CO2 22 21* 20* 22  GLUCOSE 96 97 109* 85  BUN 20 21 21  25*  CREATININE 0.58 0.65 0.54 0.75  CALCIUM 8.6* 8.5* 8.6* 8.8*  MG 1.6*  --   --   --   PHOS 3.2  --   --   --    Estimated Creatinine Clearance: 71.8 mL/min (by C-G formula based on SCr of 0.75 mg/dL). Liver & Pancreas:  Recent Labs  Lab 04/16/23 0716  AST 46*  ALT 27  ALKPHOS 121  BILITOT 0.5  PROT 6.2*  ALBUMIN 2.0*   No results for input(s): "LIPASE", "AMYLASE" in the last 168 hours. No results for input(s): "AMMONIA" in the last 168 hours. Diabetic: No results for input(s): "HGBA1C" in the last 72 hours. Recent Labs  Lab 04/20/23 2007 04/20/23 2350 04/21/23 0446 04/21/23 0746 04/21/23 1150  GLUCAP 126* 100* 115* 97 108*   Cardiac Enzymes: No results for input(s): "CKTOTAL", "CKMB", "CKMBINDEX", "TROPONINI" in the last 168 hours. No results for input(s): "PROBNP" in the last 8760 hours. Coagulation Profile: No results for input(s): "INR", "PROTIME" in the last 168 hours. Thyroid Function Tests: No results for input(s): "TSH", "T4TOTAL", "FREET4", "T3FREE", "THYROIDAB" in the last 72 hours. Lipid Profile: No results for input(s): "CHOL", "HDL", "LDLCALC", "TRIG", "CHOLHDL", "LDLDIRECT" in the last 72 hours. Anemia Panel: No results for input(s): "VITAMINB12", "FOLATE", "FERRITIN", "TIBC", "IRON", "RETICCTPCT" in the last 72 hours. Urine analysis:    Component Value Date/Time   COLORURINE YELLOW 03/22/2023 1026   APPEARANCEUR CLOUDY (A) 03/22/2023 1026   LABSPEC >1.046 (H) 03/22/2023 1026   PHURINE 5.0 03/22/2023 1026   GLUCOSEU NEGATIVE 03/22/2023 1026   HGBUR SMALL (A) 03/22/2023 1026   BILIRUBINUR NEGATIVE 03/22/2023 1026   KETONESUR NEGATIVE 03/22/2023 1026   PROTEINUR 30 (A) 03/22/2023 1026   NITRITE NEGATIVE 03/22/2023 1026   LEUKOCYTESUR LARGE (A) 03/22/2023 1026   Sepsis  Labs: Invalid input(s): "PROCALCITONIN", "LACTICIDVEN"   SIGNED:  Almon Hercules, MD  [facility] 04/22/2023, 2:37 PM

## 2023-04-22 NOTE — Progress Notes (Signed)
PT Cancellation Note  Patient Details Name: Christine [name] MRN: [id number] DOB: April 24, 1951   Cancelled Treatment:    Reason Eval/Treat Not Completed: Patient declined, no reason specified Therapist encouraged mobilizing today, even if just OOB to recliner however pt reports she is discharging home and has "a lot of things to do" when she gets there.  Pt declined to participate.   Kati L Payson 04/22/2023, 11:08 AM Paulino Door, DPT Physical Therapist Acute Rehabilitation Services Office: [phone]

## 2023-04-22 NOTE — TOC Transition Note (Signed)
Transition of Care [facility]) - Discharge Note  Patient Details  Name: Christine [name] MRN: [id number] Date of Birth: June 19, 1950  Transition of Care [facility]) CM/SW Contact:  Ewing Schlein, LCSW Phone Number: 04/22/2023, 2:04 PM  Clinical Narrative: CSW followed up with DME needs. Per patient, she will need a rollator with a seat rather than a front wheel rolling walker and she will purchase a BSC on her own. Patient agreeable to DME referral to [employer]. CSW made referral to Zack with [employer]. [employer] to deliver rollator to patient's room. CSW notified Haywood Lasso with [facility] regarding discharge. TOC signing off.    Final next level of care: Home w Home Health Services Barriers to Discharge: Barriers Resolved  Patient Goals and CMS Choice CMS Medicare.gov Compare Post Acute Care list provided to:: Patient Choice offered to / list presented to : Patient  Discharge Plan and Services Additional resources added to the After Visit Summary for         DME Arranged: Walker rolling with seat DME Agency: [employer] Date DME Agency Contacted: 04/22/23 Time DME Agency Contacted: 1344 Representative spoke with at DME Agency: Zack HH Arranged: OT, PT, Nurse's Aide, RN HH Agency: [facility] Date [facility] Agency Contacted: 04/19/23 Representative spoke with at [name] Agency: Haywood Lasso  Social Drivers of Health (SDOH) Interventions SDOH Screenings   Food Insecurity: No Food Insecurity (04/11/2023)  Housing: Low Risk  (04/11/2023)  Transportation Needs: No Transportation Needs (04/11/2023)  Utilities: Not At Risk (04/11/2023)  Alcohol Screen: Low Risk  (07/13/2022)  Depression (PHQ2-9): Low Risk  (01/21/2022)  Financial Resource Strain: Low Risk  (12/01/2022)   Received from [facility]  Physical Activity: Insufficiently Active (07/13/2022)  Social Connections: Moderately Integrated (12/01/2022)   Received from [facility]  Stress: No Stress Concern Present (07/13/2022)  Tobacco Use: Medium Risk  (04/10/2023)  Health Literacy: Medium Risk (12/01/2022)   Received from [facility]   Readmission Risk Interventions    03/23/2023   10:43 AM 01/09/2023   11:11 AM  Readmission Risk Prevention Plan  Transportation Screening Complete Complete  HRI or Home Care Consult  Complete  Social Work Consult for Recovery Care Planning/Counseling  Complete  Palliative Care Screening  Not Applicable  Medication Review Oceanographer) Complete Complete  PCP or Specialist appointment within 3-5 days of discharge Complete   HRI or Home Care Consult Complete   SW Recovery Care/Counseling Consult Complete   Palliative Care Screening Not Applicable   Skilled Nursing Facility Complete

## 2023-04-24 DIAGNOSIS — M179 Osteoarthritis of knee, unspecified: Secondary | ICD-10-CM | POA: Diagnosis not present

## 2023-04-24 DIAGNOSIS — N3946 Mixed incontinence: Secondary | ICD-10-CM | POA: Diagnosis not present

## 2023-04-24 DIAGNOSIS — M109 Gout, unspecified: Secondary | ICD-10-CM | POA: Diagnosis not present

## 2023-04-24 DIAGNOSIS — I82411 Acute embolism and thrombosis of right femoral vein: Secondary | ICD-10-CM | POA: Diagnosis not present

## 2023-04-24 DIAGNOSIS — N289 Disorder of kidney and ureter, unspecified: Secondary | ICD-10-CM | POA: Diagnosis not present

## 2023-04-24 DIAGNOSIS — F411 Generalized anxiety disorder: Secondary | ICD-10-CM | POA: Diagnosis not present

## 2023-04-24 DIAGNOSIS — E8809 Other disorders of plasma-protein metabolism, not elsewhere classified: Secondary | ICD-10-CM | POA: Diagnosis not present

## 2023-04-24 DIAGNOSIS — R627 Adult failure to thrive: Secondary | ICD-10-CM | POA: Diagnosis not present

## 2023-04-24 DIAGNOSIS — K224 Dyskinesia of esophagus: Secondary | ICD-10-CM | POA: Diagnosis not present

## 2023-04-24 DIAGNOSIS — M5416 Radiculopathy, lumbar region: Secondary | ICD-10-CM | POA: Diagnosis not present

## 2023-04-24 DIAGNOSIS — R7303 Prediabetes: Secondary | ICD-10-CM | POA: Diagnosis not present

## 2023-04-24 DIAGNOSIS — K289 Gastrojejunal ulcer, unspecified as acute or chronic, without hemorrhage or perforation: Secondary | ICD-10-CM | POA: Diagnosis not present

## 2023-04-24 DIAGNOSIS — I119 Hypertensive heart disease without heart failure: Secondary | ICD-10-CM | POA: Diagnosis not present

## 2023-04-24 DIAGNOSIS — I2510 Atherosclerotic heart disease of native coronary artery without angina pectoris: Secondary | ICD-10-CM | POA: Diagnosis not present

## 2023-04-24 DIAGNOSIS — E872 Acidosis, unspecified: Secondary | ICD-10-CM | POA: Diagnosis not present

## 2023-04-24 DIAGNOSIS — G4733 Obstructive sleep apnea (adult) (pediatric): Secondary | ICD-10-CM | POA: Diagnosis not present

## 2023-04-24 DIAGNOSIS — K2970 Gastritis, unspecified, without bleeding: Secondary | ICD-10-CM | POA: Diagnosis not present

## 2023-04-24 DIAGNOSIS — I959 Hypotension, unspecified: Secondary | ICD-10-CM | POA: Diagnosis not present

## 2023-04-24 DIAGNOSIS — Z431 Encounter for attention to gastrostomy: Secondary | ICD-10-CM | POA: Diagnosis not present

## 2023-04-24 DIAGNOSIS — K828 Other specified diseases of gallbladder: Secondary | ICD-10-CM | POA: Diagnosis not present

## 2023-04-24 DIAGNOSIS — I700 Atherosclerosis of aorta: Secondary | ICD-10-CM | POA: Diagnosis not present

## 2023-04-24 DIAGNOSIS — D649 Anemia, unspecified: Secondary | ICD-10-CM | POA: Diagnosis not present

## 2023-04-24 DIAGNOSIS — Z48815 Encounter for surgical aftercare following surgery on the digestive system: Secondary | ICD-10-CM | POA: Diagnosis not present

## 2023-04-24 DIAGNOSIS — K760 Fatty (change of) liver, not elsewhere classified: Secondary | ICD-10-CM | POA: Diagnosis not present

## 2023-04-25 ENCOUNTER — Telehealth: Payer: Self-pay

## 2023-04-25 NOTE — Transitions of Care (Post Inpatient/ED Visit) (Signed)
   04/25/2023  Name: Christine [name] MRN: [id number] DOB: [birth year]  Today's TOC FU Call Status: Today's TOC FU Call Status:: Unsuccessful Call (1st Attempt) Unsuccessful Call (1st Attempt) Date: 04/25/23  Attempted to reach the patient regarding the most recent Inpatient/ED visit.  Follow Up Plan: Additional outreach attempts will be made to reach the patient to complete the Transitions of Care (Post Inpatient/ED visit) call.   Jurrell Royster Daphine Deutscher BSN, RN RN Care Manager   Transitions of Care [facility] - [facility] Direct Dial Number:  [phone]

## 2023-04-25 NOTE — Consult Note (Signed)
[facility] Liaison Note  04/25/2023  Micheal [facility] 1950/09/17 [id number]  Location: RN Hospital Liaison screened the patient remotely at [facility].  Insurance: [employer] Advantage   Rovenia Minervini is a 72 y.o. female who is a Primary Care Patient of Carmelia Roller, Jilda Roche, DO with [facility] Primary Care Med-Center [facility]. The patient was screened for 7 and 30 day readmission hospitalization with noted extreme risk score for unplanned readmission risk with 4 IP/ 1 ED in 6 months.  The patient was assessed for potential Care Management service needs for post hospital transition for care coordination. Review of patient's electronic medical record reveals patient  due to dislodged gastrostomy tube. Pt discharged home with [facility] for PT/OT/RN/ aide services with [facility]. Due to pt's risk will make a referral for nurse care coordinator for post prevention readmission follow up call.   Plan: [facility] Liaison will continue to follow progress and disposition to asess for post hospital community care coordination/management needs.  Referral request for community care coordination: anticipate Transitions of Care Team follow up.   VBCI Care Management/Population Health does not replace or interfere with any arrangements made by the Inpatient Transition of Care team.   For questions contact:   Elliot Cousin, RN, [facility] Liaison [place]   [facility], Population Health Office Hours MTWF  8:00 am-6:00 pm Direct Dial: [phone] mobile [phone] [Office toll free line] Office Hours are M-F 8:30 - 5 pm [email or web address]

## 2023-04-26 ENCOUNTER — Telehealth: Payer: Self-pay | Admitting: *Deleted

## 2023-04-26 NOTE — Transitions of Care (Post Inpatient/ED Visit) (Signed)
   04/26/2023  Name: Christine [name] MRN: [id number] DOB: 1951/04/19  Today's TOC FU Call Status: Today's TOC FU Call Status:: Unsuccessful Call (2nd Attempt) Unsuccessful Call (2nd Attempt) Date: 04/26/23  Attempted to reach the patient regarding the most recent Inpatient visit; left HIPAA compliant voice message requesting call back  Follow Up Plan: Additional outreach attempts will be made to reach the patient to complete the Transitions of Care (Post Inpatient visit) call.   Mai Longnecker Mckinney Ajanae Virag, RN, BSN, Media Planner  Transitions of Care  [facility] - [facility] [phone]: direct office

## 2023-04-28 ENCOUNTER — Telehealth: Payer: Self-pay

## 2023-04-28 DIAGNOSIS — M5416 Radiculopathy, lumbar region: Secondary | ICD-10-CM | POA: Diagnosis not present

## 2023-04-28 DIAGNOSIS — I119 Hypertensive heart disease without heart failure: Secondary | ICD-10-CM | POA: Diagnosis not present

## 2023-04-28 DIAGNOSIS — I700 Atherosclerosis of aorta: Secondary | ICD-10-CM | POA: Diagnosis not present

## 2023-04-28 DIAGNOSIS — Z48815 Encounter for surgical aftercare following surgery on the digestive system: Secondary | ICD-10-CM | POA: Diagnosis not present

## 2023-04-28 DIAGNOSIS — R627 Adult failure to thrive: Secondary | ICD-10-CM | POA: Diagnosis not present

## 2023-04-28 DIAGNOSIS — E8809 Other disorders of plasma-protein metabolism, not elsewhere classified: Secondary | ICD-10-CM | POA: Diagnosis not present

## 2023-04-28 DIAGNOSIS — K289 Gastrojejunal ulcer, unspecified as acute or chronic, without hemorrhage or perforation: Secondary | ICD-10-CM | POA: Diagnosis not present

## 2023-04-28 DIAGNOSIS — I82411 Acute embolism and thrombosis of right femoral vein: Secondary | ICD-10-CM | POA: Diagnosis not present

## 2023-04-28 DIAGNOSIS — D649 Anemia, unspecified: Secondary | ICD-10-CM | POA: Diagnosis not present

## 2023-04-28 DIAGNOSIS — E872 Acidosis, unspecified: Secondary | ICD-10-CM | POA: Diagnosis not present

## 2023-04-28 DIAGNOSIS — K828 Other specified diseases of gallbladder: Secondary | ICD-10-CM | POA: Diagnosis not present

## 2023-04-28 DIAGNOSIS — G4733 Obstructive sleep apnea (adult) (pediatric): Secondary | ICD-10-CM | POA: Diagnosis not present

## 2023-04-28 DIAGNOSIS — M109 Gout, unspecified: Secondary | ICD-10-CM | POA: Diagnosis not present

## 2023-04-28 DIAGNOSIS — K2970 Gastritis, unspecified, without bleeding: Secondary | ICD-10-CM | POA: Diagnosis not present

## 2023-04-28 DIAGNOSIS — I959 Hypotension, unspecified: Secondary | ICD-10-CM | POA: Diagnosis not present

## 2023-04-28 DIAGNOSIS — K224 Dyskinesia of esophagus: Secondary | ICD-10-CM | POA: Diagnosis not present

## 2023-04-28 DIAGNOSIS — Z431 Encounter for attention to gastrostomy: Secondary | ICD-10-CM | POA: Diagnosis not present

## 2023-04-28 DIAGNOSIS — K760 Fatty (change of) liver, not elsewhere classified: Secondary | ICD-10-CM | POA: Diagnosis not present

## 2023-04-28 DIAGNOSIS — I2510 Atherosclerotic heart disease of native coronary artery without angina pectoris: Secondary | ICD-10-CM | POA: Diagnosis not present

## 2023-04-28 DIAGNOSIS — M179 Osteoarthritis of knee, unspecified: Secondary | ICD-10-CM | POA: Diagnosis not present

## 2023-04-28 DIAGNOSIS — N3946 Mixed incontinence: Secondary | ICD-10-CM | POA: Diagnosis not present

## 2023-04-28 DIAGNOSIS — R7303 Prediabetes: Secondary | ICD-10-CM | POA: Diagnosis not present

## 2023-04-28 DIAGNOSIS — F411 Generalized anxiety disorder: Secondary | ICD-10-CM | POA: Diagnosis not present

## 2023-04-28 DIAGNOSIS — N289 Disorder of kidney and ureter, unspecified: Secondary | ICD-10-CM | POA: Diagnosis not present

## 2023-04-28 NOTE — Transitions of Care (Post Inpatient/ED Visit) (Signed)
 04/28/2023  Name: Christine [name] MRN: [id number] DOB: 21-Aug-1950  Today's TOC FU Call Status: Today's TOC FU Call Status:: Successful TOC FU Call Completed TOC FU Call Complete Date: 04/28/23 Patient's Name and Date of Birth confirmed.  Transition Care Management Follow-up Telephone Call Date of Discharge: 04/22/23 Discharge Facility: [facility]) Type of Discharge: Inpatient Admission Primary Inpatient Discharge Diagnosis:: dislodged gastrostomy tube How have you been since you were released from the hospital?: Better (Pt voices she is doing well-has been sleeping good-appetitie is finally getting better- BM yest, she is up walking some with walker but reports legs still weak at times) Any questions or concerns?: No  Items Reviewed: Did you receive and understand the discharge instructions provided?: Yes Medications obtained,verified, and reconciled?: Yes (Medications Reviewed) (pt voices that she is not taking any of her bariatric-gastric bypass surgery meds-plans to talk to MD about it tomorrow at appt) Any new allergies since your discharge?: No Dietary orders reviewed?: Yes Type of Diet Ordered:: low salt/heart healthy Do you have support at home?: Yes (pt lievs alone-states all her family lives in [place]  which she plans to back) People in Home: alone Name of Support/Comfort Primary Source: pt lives alone-states all her family lives in [place]  which she plans to back in the next couple of months, supportive sis in [place]  helps, neighbors next door that support and assist her daily  Medications Reviewed Today: Medications Reviewed Today     Reviewed by Rochel Rama Loose, RN (Registered Nurse) on 04/28/23 at 1222  Med List Status: <None>   Medication Order Taking? Sig Documenting Provider Last Dose Status Informant  acetaminophen  (TYLENOL ) 325 MG tablet [id number] Yes Take 650 mg by mouth every 4 (four) hours as needed for mild pain or moderate  pain. [provider] 04/28/2023 Active Nursing Home Medication Administration Guide (MAG), Pharmacy Records           Med Note CARLEEN GEORGIANN BIRCH   Tue Apr 12, 2023  9:41 AM) unknown  apixaban  (ELIQUIS ) 5 MG TABS tablet [id number] Yes Place 1 tablet (5 mg total) into feeding tube 2 (two) times daily. Gonfa, Taye T, MD 04/28/2023 Active            Med Note CONNIE, [facility] J   Thu Apr 28, 2023 12:15 PM) Pt states taking by mouth not by feeding tube  ascorbic acid  (VITAMIN C ) 500 MG tablet [id number] No Place 1 tablet (500 mg total) into feeding tube daily.  Patient not taking: Reported on 04/28/2023   Gonfa, Taye T, MD Not Taking Active            Med Note CONNIE, RAMA JINNY Schaumann Apr 28, 2023 12:15 PM) Pt states taking by mouth not by feeding tube  colchicine  0.6 MG tablet [id number] Yes Take 1 tablet (0.6 mg total) by mouth daily. Kathrin Mignon DASEN, MD 04/28/2023 Active   copper  tablet [id number] No Place 1 tablet (2 mg total) into feeding tube daily.  Patient not taking: Reported on 04/28/2023   Gonfa, Taye T, MD Not Taking Active   feeding supplement, GLUCERNA SHAKE, (GLUCERNA SHAKE) LIQD [id number] Yes Take 237 mLs by mouth daily. Gonfa, Taye T, MD Taking Active   ferrous sulfate  325 (65 FE) MG EC tablet [id number] No Take 1 tablet (325 mg total) by mouth in the morning and at bedtime.  Patient not taking: Reported on 04/28/2023   Gonfa, Taye T, MD Not Taking Active   furosemide  (LASIX ) 20 MG tablet [id number]  Yes Take 1 tablet (20 mg total) by mouth daily. Gonfa, Taye T, MD 04/28/2023 Active   Multiple Vitamin (MULTIVITAMIN WITH MINERALS) TABS tablet [id number] No Place 1 tablet into feeding tube daily.  Patient not taking: Reported on 04/28/2023   Gonfa, Taye T, MD Not Taking Active   ondansetron  (ZOFRAN ) 4 MG tablet [id number] Yes Place 1 tablet (4 mg total) into feeding tube every 6 (six) hours as needed for nausea. Sherrill Cable [place], DO Taking Active   pantoprazole  (PROTONIX ) 40 MG tablet  [id number] Yes Take 1 tablet (40 mg total) by mouth daily. Gonfa, Taye T, MD 04/28/2023 Active   polyethylene glycol powder (MIRALAX ) 17 GM/SCOOP powder [id number] Yes Take 17 g by mouth 2 (two) times daily as needed for mild constipation. Gonfa, Taye T, MD Taking Active   senna-docusate (SENOKOT-S) 8.6-50 MG tablet [id number] Yes Take 1 tablet by mouth 2 (two) times daily between meals as needed for mild constipation. Gonfa, Taye T, MD Taking Active   thiamine  (VITAMIN B-1) 100 MG tablet [id number] Yes Take 1 tablet (100 mg total) by mouth daily. Gonfa, Taye T, MD 04/28/2023 Active   vitamin D3 (CHOLECALCIFEROL ) 25 MCG tablet [id number] No Place 2 tablets (2,000 Units total) into feeding tube daily.  Patient not taking: Reported on 04/28/2023   Gonfa, Taye T, MD Not Taking Active   Med List Note Rennis, Durojahye' R, CPhT 04/10/23 2332): [facility] [phone]            Home Care and Equipment/Supplies: Were Home Health Services Ordered?: Yes Name of Home Health Agency:: [facility] Has Agency set up a time to come to your home?: Yes First Home Health Visit Date: 04/24/23 (pt confirms nurse came out for first visit and came out again today) Any new equipment or medical supplies ordered?: Yes Name of Medical supply agency?: [employer]-rolling walker Were you able to get the equipment/medical supplies?: Yes Do you have any questions related to the use of the equipment/supplies?: No  Functional Questionnaire: Do you need assistance with bathing/showering or dressing?: Yes (HH in place and helping her while she recovers) Do you need assistance with meal preparation?: Yes (pt states she does a lot of canned nd pre-packaged foods, or uses her small griddle-does not like to cook) Do you need assistance with eating?: No Do you have difficulty maintaining continence: No Do you need assistance with getting out of bed/getting out of a chair/moving?: No Do you have difficulty managing or taking your  medications?: No  Follow up appointments reviewed: PCP Follow-up appointment confirmed?: Yes Date of PCP follow-up appointment?: 04/29/23 Follow-up Provider: Dr. Frann Specialist [facility] Follow-up appointment confirmed?: No Reason Specialist Follow-Up Not Confirmed: Patient has Specialist Provider Number and will Call for Appointment (pt voices she will call surgeon office) Do you need transportation to your follow-up appointment?: No (pt voices that she will use a Uber/Lyft until she is able to resume driving) Do you understand care options if your condition(s) worsen?: Yes-patient verbalized understanding  SDOH Interventions Today    Flowsheet Row Most Recent Value  SDOH Interventions   Food Insecurity Interventions Intervention Not Indicated  Housing Interventions Intervention Not Indicated  Transportation Interventions Intervention Not Indicated  Utilities Interventions Intervention Not Indicated       Goals      Patient Stated     She is seeing a therapist - working on being more social     [facility] Care Plan     Current Barriers:  Chronic Disease Management support and  education needs related to FTT, GAD, Gout, bariatric surgery- gastrostomy tube  Lacks caregiver support-declined SW referral at this time-reports HH agency sending out a SW  RNCM Clinical Goal(s):  Patient will work with the Care Management team over the next 30 days to address Transition of Care Barriers: mgmt of chronic conditions verbalize understanding of plan for management of chronic conditions as evidenced by adherence to plan of care take all medications exactly as prescribed and will call provider for medication related questions as evidenced by med compliance/adherence-discuss with provider regarding rather or not to take bariatric surgery meds attend all scheduled medical appointments:   as evidenced by completion of PCP and surgeon  through collaboration with RN Care manager, provider, and care  team.   Interventions: Evaluation of current treatment plan related to  self management and patient's adherence to plan as established by provider  Transitions of Care:  New goal. Doctor Visits  - discussed the importance of doctor visits Reviewed Signs and symptoms of infection Assessed for nutritional status- pt voices that her appetite is finally coming back-eating small freq meals due to gastric bypass surgery she had -May 2024, pt has G-tube in place- currently not getting any tube feedings or using tube as her oral intake has increased-she plans to talk to MD about getting it removed at upcoming appt Assessed status of G-tube- pt with recent admission due to dislodging of tube- she voices tube is intact/ in place-no issues with it-it's just annoying & I want it taken out- she is doing daily care to area on the days that RN does not come visit Education provided to pt on healthy food choices-diet options-pt lives alone-doesn't like to cook so admits to eating a lot of canned and pre-packaged meals-declined referral to SW for Meals on [facility] agency has arranged for a SW to come out to see her Confirmed HH services in place- pt active with [facility] and they have have been out to visit her-pt states RN came today and perform drsg change to G-tube SDOH assessment completed- pt was driving prior to admision-she remians weak/deconditoned- unabel to drive right now-plan to use Uber/Lyft to Pcp appt tomorrow Assessed for home safety - pt lives alone- all of her family lives in [place] -she is in the process of selling her home and moving to [place]  to be closer to family within the next few months-states she has supportive [place] neighbors who come and check on her daily and assist her as needed  Educated pt on importance of obtaining Life Alert device for safety in the home- she will look to see if [place] plan offers any discounts/services for obtaining one  Patient Goals/Self-Care  Activities: Participate in Transition of Care Program/Attend TOC scheduled calls Take all medications as prescribed Attend all scheduled provider appointments Call provider office for new concerns or questions   Follow Up Plan:  Telephone follow up appointment with care management team member scheduled for:  05/04/23- 1pm The patient has been provided with contact information for the care management team and has been advised to call with any health related questions or concerns.          Rama Pilling, RN,BSN,CCM RN Care Manager Transitions of Care  [place]-VBCI/Population Health  Direct Phone: [phone] Toll Free: [phone] Fax: [phone]

## 2023-04-29 ENCOUNTER — Telehealth: Payer: Self-pay | Admitting: *Deleted

## 2023-04-29 ENCOUNTER — Encounter: Payer: Self-pay | Admitting: Family Medicine

## 2023-04-29 ENCOUNTER — Ambulatory Visit (INDEPENDENT_AMBULATORY_CARE_PROVIDER_SITE_OTHER): Payer: Medicare HMO | Admitting: Family Medicine

## 2023-04-29 VITALS — BP 130/62 | HR 116 | Temp 98.0°F | Resp 16 | Ht 61.0 in | Wt 198.8 lb

## 2023-04-29 DIAGNOSIS — I82412 Acute embolism and thrombosis of left femoral vein: Secondary | ICD-10-CM | POA: Diagnosis not present

## 2023-04-29 DIAGNOSIS — R627 Adult failure to thrive: Secondary | ICD-10-CM

## 2023-04-29 LAB — COMPREHENSIVE METABOLIC PANEL
ALT: 18 U/L (ref 0–35)
AST: 27 U/L (ref 0–37)
Albumin: 3.3 g/dL — ABNORMAL LOW (ref 3.5–5.2)
Alkaline Phosphatase: 104 U/L (ref 39–117)
BUN: 24 mg/dL — ABNORMAL HIGH (ref 6–23)
CO2: 17 meq/L — ABNORMAL LOW (ref 19–32)
Calcium: 9.3 mg/dL (ref 8.4–10.5)
Chloride: 107 meq/L (ref 96–112)
Creatinine, Ser: 1.24 mg/dL — ABNORMAL HIGH (ref 0.40–1.20)
GFR: 44.21 mL/min — ABNORMAL LOW (ref >60.00)
Glucose, Bld: 74 mg/dL (ref 70–99)
Potassium: 3.4 meq/L — ABNORMAL LOW (ref 3.5–5.1)
Sodium: 140 meq/L (ref 135–145)
Total Bilirubin: 0.6 mg/dL (ref 0.2–1.2)
Total Protein: 7 g/dL (ref 6.0–8.3)

## 2023-04-29 LAB — CBC
HCT: 30.5 % — ABNORMAL LOW (ref 36.0–46.0)
Hemoglobin: 9.6 g/dL — ABNORMAL LOW (ref 12.0–15.0)
MCHC: 31.4 g/dL (ref 30.0–36.0)
MCV: 95.2 fL (ref 78.0–100.0)
Platelets: 553 10*3/uL — ABNORMAL HIGH (ref 150.0–400.0)
RBC: 3.21 Mil/uL — ABNORMAL LOW (ref 3.87–5.11)
RDW: 17.1 % — ABNORMAL HIGH (ref 11.5–15.5)
WBC: 5.9 10*3/uL (ref 4.0–10.5)

## 2023-04-29 NOTE — Patient Instructions (Addendum)
 Give us  2-3 business days to get the results of your labs back.   Stay as active as your body will allow.   If you do not hear anything about your referrals in the next 1-2 weeks, call our office and ask for an update.  Let us  know if you need anything.

## 2023-04-29 NOTE — Progress Notes (Signed)
 Complex Care Management Note  Care Guide Note 04/29/2023 Name: Christine [name] MRN: [id number] DOB: 07-18-1950  Christine [name] is a 73 y.o. year old female who sees Frann, Mabel Mt, DO for primary care. I reached out to Christine [name] by phone today to offer complex care management services.  Christine [name] was given information about Complex Care Management services today including:   The Complex Care Management services include support from the care team which includes your Nurse Coordinator, Clinical Social Worker, or Pharmacist.  The Complex Care Management team is here to help remove barriers to the health concerns and goals most important to you. Complex Care Management services are voluntary, and the patient may decline or stop services at any time by request to their care team member.   Complex Care Management Consent Status: Patient agreed to services and verbal consent obtained.   Follow up plan:  Telephone appointment with complex care management team member scheduled for:  05/04/23  Encounter Outcome:  Patient Scheduled  [name] Coordination Care Guide  Direct Dial: [phone]

## 2023-04-29 NOTE — Progress Notes (Signed)
 Chief Complaint  Patient presents with   Hospitalization Follow-up    Hospital follow up    HPI Christine [name] is a 73 y.o. y.o. female who presents for a hospital follow-up.  Patient was at [facility] long hospital from 04/10/2023 and discharged on 04/22/2023.  She had a dislodged G-tube.  It was replaced by the interventional radiology team on 12/16.  On imaging, she was incidentally found to have a DVT in the left common and superficial femoral vein.  She was started on Eliquis  on 12/16.  Prior to admission, she had been in a skilled nursing facility and had not been very mobile.  Currently, she feels overall improved.  She is still very weak and home health physical therapy is working with her.  Nursing is responsible for changing her dressings and ensuring her G-tube site looks safe.  She is not using the G-tube anymore.  She has a follow-up with the surgeon today.  When she stands up, she feels that she is going to pass out.  Because of this, she is not able to stand for long periods of time.  She is requesting home health aide a couple days a week.  She was told insurance would not cover this.  She is seeing if we can help with this.  She would like to go back to [place]  where her sister is.  She reports that she would not require an aide if she was able to go.  Past Medical History:  Diagnosis Date   Allergy    dust, pollen, sulfa, prednisone   Anemia    Anxiety    Arthritis    knees (04/26/2016)   Colon polyps    benign per pt   Coronary artery disease    mild per 2015 cath in [place]  (OM1 30%, RCA 30%)   GERD (gastroesophageal reflux disease)    Gout    History of hiatal hernia    Hyperlipidemia 05/20/2021   Hypertension    Migraine    none since early /2017 (05/06/2016)   Obesity    OSA on CPAP    uses CPAP   Pre-diabetes    Pre-operative cardiovascular examination 08/22/2008   Renal insufficiency 11/09/2022   Vasculitis (HCC)    Bilateral   Past Surgical  History:  Procedure Laterality Date   ABDOMINAL HYSTERECTOMY     partial; both ovaries present   APPENDECTOMY  05/06/2016   BIOPSY  04/08/2022   Procedure: BIOPSY;  Surgeon: Legrand Victory LITTIE DOUGLAS, MD;  Location: [facility];  Service: Gastroenterology;;   CARDIAC CATHETERIZATION  ~ 2015   CARDIAC CATHETERIZATION  2015   In [place]    CHILECTOMY Right 06/01/2017   Procedure: CHILECTOMY RIGHT FOOT;  Surgeon: Janit Thresa HERO, DPM;  Location: [facility] OR;  Service: Podiatry;  Laterality: Right;   CHOLECYSTECTOMY N/A 01/25/2023   Procedure: LAPAROSCOPIC CHOLECYSTECTOMY WITH INTRAOPERATIVE CHOLANGIOGRAM;  Surgeon: Lyndel Deward PARAS, MD;  Location: [facility] OR;  Service: General;  Laterality: N/A;   ESOPHAGOGASTRODUODENOSCOPY N/A 01/25/2023   Procedure: ESOPHAGOGASTRODUODENOSCOPY (EGD);  Surgeon: Lyndel Deward PARAS, MD;  Location: [facility] OR;  Service: General;  Laterality: N/A;   ESOPHAGOGASTRODUODENOSCOPY (EGD) WITH PROPOFOL  N/A 04/08/2022   Procedure: ESOPHAGOGASTRODUODENOSCOPY (EGD) WITH PROPOFOL ;  Surgeon: Legrand Victory LITTIE DOUGLAS, MD;  Location: [facility];  Service: Gastroenterology;  Laterality: N/A;   IR GASTROSTOMY TUBE REMOVAL  03/21/2023   IR REPLC GASTRO/COLONIC TUBE PERCUT W/FLUORO  04/11/2023   LAPAROSCOPIC APPENDECTOMY N/A 05/06/2016   Procedure: LAPAROSCOPIC APPENDECTOMY;  Surgeon: Donnice  Belinda, MD;  Location: [facility];  Service: General;  Laterality: N/A;   LAPAROSCOPIC INSERTION GASTROSTOMY TUBE N/A 01/25/2023   Procedure: LAPAROSCOPIC INSERTION REMNANT GASTROSTOMY TUBE;  Surgeon: Lyndel Deward PARAS, MD;  Location: [facility] OR;  Service: General;  Laterality: N/A;   LAPAROTOMY N/A 03/21/2023   Procedure: EXPLORATORY LAPAROTOMY, PATCH REPAIR OF GASTRIC ULCER;  Surgeon: Dasie Leonor CROME, MD;  Location: [facility];  Service: General;  Laterality: N/A;   POLYPECTOMY  04/08/2022   Procedure: POLYPECTOMY;  Surgeon: Legrand Victory CROME DOUGLAS, MD;  Location: [facility];  Service: Gastroenterology;;   TONSILLECTOMY      Family History  Problem Relation Age of Onset   Diabetes Mother    Heart disease Mother    High blood pressure Mother    Asthma Mother    Arthritis Mother    Diabetes Father    Heart disease Father    High blood pressure Father    Asthma Father    Diabetes Sister    Alcohol abuse Brother    Drug abuse Brother    Arthritis Sister    Diabetes Sister    Varicose Veins Sister    Colon cancer Neg Hx    Esophageal cancer Neg Hx    Rectal cancer Neg Hx    Allergies as of 04/29/2023       Reactions   Prednisone Swelling   Legs swell    Tape Hives   Sulfa Antibiotics Rash        Medication List        Accurate as of April 29, 2023 11:36 AM. If you have any questions, ask your nurse or doctor.          acetaminophen  325 MG tablet Commonly known as: TYLENOL  Take 650 mg by mouth every 4 (four) hours as needed for mild pain or moderate pain.   ascorbic acid  500 MG tablet Commonly known as: VITAMIN C  Place 1 tablet (500 mg total) into feeding tube daily.   colchicine  0.6 MG tablet Take 1 tablet (0.6 mg total) by mouth daily.   copper  tablet Place 1 tablet (2 mg total) into feeding tube daily.   Eliquis  5 MG Tabs tablet Generic drug: apixaban  Place 1 tablet (5 mg total) into feeding tube 2 (two) times daily.   feeding supplement (GLUCERNA SHAKE) Liqd Take 237 mLs by mouth daily.   ferrous sulfate  325 (65 FE) MG EC tablet Take 1 tablet (325 mg total) by mouth in the morning and at bedtime.   furosemide  20 MG tablet Commonly known as: LASIX  Take 1 tablet (20 mg total) by mouth daily.   multivitamin with minerals Tabs tablet Place 1 tablet into feeding tube daily.   ondansetron  4 MG tablet Commonly known as: ZOFRAN  Place 1 tablet (4 mg total) into feeding tube every 6 (six) hours as needed for nausea.   pantoprazole  40 MG tablet Commonly known as: Protonix  Take 1 tablet (40 mg total) by mouth daily.   polyethylene glycol powder 17 GM/SCOOP  powder Commonly known as: MiraLax  Take 17 g by mouth 2 (two) times daily as needed for mild constipation.   senna-docusate 8.6-50 MG tablet Commonly known as: Senokot-S Take 1 tablet by mouth 2 (two) times daily between meals as needed for mild constipation.   thiamine  100 MG tablet Commonly known as: VITAMIN B1 Take 1 tablet (100 mg total) by mouth daily.   vitamin D3 25 MCG tablet Commonly known as: CHOLECALCIFEROL  Place 2 tablets (2,000 Units total) into feeding  tube daily.        ROS:  Constitutional: No fevers or chills, no weight loss HEENT: No headaches, hearing loss, or runny nose, no sore throat Heart: No chest pain Lungs: No SOB, no cough Abd: No bowel changes, no pain, no N/V GU: No urinary complaints Neuro: No numbness, tingling or weakness Msk: No joint or muscle pain  Objective BP 130/62   Pulse (!) 116   Temp 98 F (36.7 C) (Oral)   Resp 16   Ht 5' 1 (1.549 m)   Wt 198 lb 12.8 oz (90.2 kg)   SpO2 98%   BMI 37.56 kg/m  General Appearance:  awake, alert, oriented, in no acute distress and well developed, well nourished Head/face:  NCAT Mouth/Throat:  Mucosa moist, no lesions; pharynx without erythema, edema or exudate. Neck:  neck- supple, no mass, non-tender and no jvd Lungs: Clear to auscultation.  No rales, rhonchi, or wheezing. Normal effort, no accessory muscle use. Heart:  Heart sounds are normal.  Regular rate and rhythm. No bruits. Abdomen:  BS+, soft, NT, ND, dressing cdi, no ttp around G tube.  Neurologic:  Alert and oriented x 3 Psych exam: Nml mood and affect, age appropriate judgment and insight  Acute deep vein thrombosis (DVT) of left femoral vein (HCC)  Failure to thrive in adult - Plan: AMB Referral [facility] Care Management, Ambulatory referral to Home Health, CBC, Comprehensive metabolic panel  She will continue Eliquis  5 mg twice daily.  This will continue for at least 3 months.  Based off of her physical activity, we may be able to  stop it or continue if she is more sedentary.   Refer to social work and home health to see if she can get an engineer, production.  Check above labs.  Appreciate home nursing with wound care.  I do not see any contraindication to traveling.  I did speak with her sister about the situation.  I emphasized several times that if she were to move back to [place] , she would require near immediate follow-up with the surgeon and a new PCP.  I recommended setting that up beforehand and then scheduling a potential move once those appointments are established. F/u around mid March if she is still in the area. The patient voiced understanding and agreement to the plan.  I spent 54 minutes with the patient discussing the above plans in addition to reviewing her chart and speaking with her family member on her cell phone on the same day of the visit.  Mabel Mt Lamar, DO 04/29/23 11:36 AM

## 2023-04-30 DIAGNOSIS — I700 Atherosclerosis of aorta: Secondary | ICD-10-CM | POA: Diagnosis not present

## 2023-04-30 DIAGNOSIS — K828 Other specified diseases of gallbladder: Secondary | ICD-10-CM | POA: Diagnosis not present

## 2023-04-30 DIAGNOSIS — M179 Osteoarthritis of knee, unspecified: Secondary | ICD-10-CM | POA: Diagnosis not present

## 2023-04-30 DIAGNOSIS — D649 Anemia, unspecified: Secondary | ICD-10-CM | POA: Diagnosis not present

## 2023-04-30 DIAGNOSIS — R7303 Prediabetes: Secondary | ICD-10-CM | POA: Diagnosis not present

## 2023-04-30 DIAGNOSIS — M109 Gout, unspecified: Secondary | ICD-10-CM | POA: Diagnosis not present

## 2023-04-30 DIAGNOSIS — G4733 Obstructive sleep apnea (adult) (pediatric): Secondary | ICD-10-CM | POA: Diagnosis not present

## 2023-04-30 DIAGNOSIS — E8809 Other disorders of plasma-protein metabolism, not elsewhere classified: Secondary | ICD-10-CM | POA: Diagnosis not present

## 2023-04-30 DIAGNOSIS — E872 Acidosis, unspecified: Secondary | ICD-10-CM | POA: Diagnosis not present

## 2023-04-30 DIAGNOSIS — N289 Disorder of kidney and ureter, unspecified: Secondary | ICD-10-CM | POA: Diagnosis not present

## 2023-04-30 DIAGNOSIS — F411 Generalized anxiety disorder: Secondary | ICD-10-CM | POA: Diagnosis not present

## 2023-04-30 DIAGNOSIS — N3946 Mixed incontinence: Secondary | ICD-10-CM | POA: Diagnosis not present

## 2023-04-30 DIAGNOSIS — I959 Hypotension, unspecified: Secondary | ICD-10-CM | POA: Diagnosis not present

## 2023-04-30 DIAGNOSIS — I119 Hypertensive heart disease without heart failure: Secondary | ICD-10-CM | POA: Diagnosis not present

## 2023-04-30 DIAGNOSIS — I2510 Atherosclerotic heart disease of native coronary artery without angina pectoris: Secondary | ICD-10-CM | POA: Diagnosis not present

## 2023-04-30 DIAGNOSIS — I82411 Acute embolism and thrombosis of right femoral vein: Secondary | ICD-10-CM | POA: Diagnosis not present

## 2023-04-30 DIAGNOSIS — K760 Fatty (change of) liver, not elsewhere classified: Secondary | ICD-10-CM | POA: Diagnosis not present

## 2023-04-30 DIAGNOSIS — K224 Dyskinesia of esophagus: Secondary | ICD-10-CM | POA: Diagnosis not present

## 2023-04-30 DIAGNOSIS — Z48815 Encounter for surgical aftercare following surgery on the digestive system: Secondary | ICD-10-CM | POA: Diagnosis not present

## 2023-04-30 DIAGNOSIS — Z431 Encounter for attention to gastrostomy: Secondary | ICD-10-CM | POA: Diagnosis not present

## 2023-04-30 DIAGNOSIS — K2970 Gastritis, unspecified, without bleeding: Secondary | ICD-10-CM | POA: Diagnosis not present

## 2023-04-30 DIAGNOSIS — M5416 Radiculopathy, lumbar region: Secondary | ICD-10-CM | POA: Diagnosis not present

## 2023-04-30 DIAGNOSIS — R627 Adult failure to thrive: Secondary | ICD-10-CM | POA: Diagnosis not present

## 2023-04-30 DIAGNOSIS — K289 Gastrojejunal ulcer, unspecified as acute or chronic, without hemorrhage or perforation: Secondary | ICD-10-CM | POA: Diagnosis not present

## 2023-05-02 ENCOUNTER — Telehealth: Payer: Self-pay | Admitting: Family Medicine

## 2023-05-02 ENCOUNTER — Other Ambulatory Visit: Payer: Self-pay

## 2023-05-02 DIAGNOSIS — N289 Disorder of kidney and ureter, unspecified: Secondary | ICD-10-CM | POA: Diagnosis not present

## 2023-05-02 DIAGNOSIS — N3946 Mixed incontinence: Secondary | ICD-10-CM | POA: Diagnosis not present

## 2023-05-02 DIAGNOSIS — I82411 Acute embolism and thrombosis of right femoral vein: Secondary | ICD-10-CM | POA: Diagnosis not present

## 2023-05-02 DIAGNOSIS — K224 Dyskinesia of esophagus: Secondary | ICD-10-CM | POA: Diagnosis not present

## 2023-05-02 DIAGNOSIS — E876 Hypokalemia: Secondary | ICD-10-CM

## 2023-05-02 DIAGNOSIS — D649 Anemia, unspecified: Secondary | ICD-10-CM | POA: Diagnosis not present

## 2023-05-02 DIAGNOSIS — K828 Other specified diseases of gallbladder: Secondary | ICD-10-CM | POA: Diagnosis not present

## 2023-05-02 DIAGNOSIS — M109 Gout, unspecified: Secondary | ICD-10-CM | POA: Diagnosis not present

## 2023-05-02 DIAGNOSIS — K760 Fatty (change of) liver, not elsewhere classified: Secondary | ICD-10-CM | POA: Diagnosis not present

## 2023-05-02 DIAGNOSIS — I700 Atherosclerosis of aorta: Secondary | ICD-10-CM | POA: Diagnosis not present

## 2023-05-02 DIAGNOSIS — R627 Adult failure to thrive: Secondary | ICD-10-CM | POA: Diagnosis not present

## 2023-05-02 DIAGNOSIS — R7303 Prediabetes: Secondary | ICD-10-CM | POA: Diagnosis not present

## 2023-05-02 DIAGNOSIS — G4733 Obstructive sleep apnea (adult) (pediatric): Secondary | ICD-10-CM | POA: Diagnosis not present

## 2023-05-02 DIAGNOSIS — E872 Acidosis, unspecified: Secondary | ICD-10-CM | POA: Diagnosis not present

## 2023-05-02 DIAGNOSIS — K2970 Gastritis, unspecified, without bleeding: Secondary | ICD-10-CM | POA: Diagnosis not present

## 2023-05-02 DIAGNOSIS — M179 Osteoarthritis of knee, unspecified: Secondary | ICD-10-CM | POA: Diagnosis not present

## 2023-05-02 DIAGNOSIS — E8809 Other disorders of plasma-protein metabolism, not elsewhere classified: Secondary | ICD-10-CM | POA: Diagnosis not present

## 2023-05-02 DIAGNOSIS — Z431 Encounter for attention to gastrostomy: Secondary | ICD-10-CM | POA: Diagnosis not present

## 2023-05-02 DIAGNOSIS — I119 Hypertensive heart disease without heart failure: Secondary | ICD-10-CM | POA: Diagnosis not present

## 2023-05-02 DIAGNOSIS — I2510 Atherosclerotic heart disease of native coronary artery without angina pectoris: Secondary | ICD-10-CM | POA: Diagnosis not present

## 2023-05-02 DIAGNOSIS — Z48815 Encounter for surgical aftercare following surgery on the digestive system: Secondary | ICD-10-CM | POA: Diagnosis not present

## 2023-05-02 DIAGNOSIS — N179 Acute kidney failure, unspecified: Secondary | ICD-10-CM

## 2023-05-02 DIAGNOSIS — M5416 Radiculopathy, lumbar region: Secondary | ICD-10-CM | POA: Diagnosis not present

## 2023-05-02 DIAGNOSIS — I959 Hypotension, unspecified: Secondary | ICD-10-CM | POA: Diagnosis not present

## 2023-05-02 DIAGNOSIS — K289 Gastrojejunal ulcer, unspecified as acute or chronic, without hemorrhage or perforation: Secondary | ICD-10-CM | POA: Diagnosis not present

## 2023-05-02 DIAGNOSIS — F411 Generalized anxiety disorder: Secondary | ICD-10-CM | POA: Diagnosis not present

## 2023-05-02 NOTE — Telephone Encounter (Signed)
 We have received home health orders and provider has singed the orders. Paperwork has been faxed.

## 2023-05-02 NOTE — Telephone Encounter (Unsigned)
 Copied from CRM [id number]. Topic: Referral - Status >> May 02, 2023 11:14 AM Curlee DEL wrote: Reason for CRM: Patient was seen on Friday with Dr. Frann where they discussed Home Healthcare for her. Originally, it was thought that they would not cover this - the patient spoke with her insurance company and they provided a number for the provider to call and request a Medical necessity form - Provider's office can call [phone] to request the form and it will include information regarding how to submit it back.

## 2023-05-03 ENCOUNTER — Encounter: Payer: Self-pay | Admitting: Family Medicine

## 2023-05-03 ENCOUNTER — Other Ambulatory Visit: Payer: Self-pay

## 2023-05-03 DIAGNOSIS — R627 Adult failure to thrive: Secondary | ICD-10-CM

## 2023-05-04 ENCOUNTER — Encounter: Payer: Self-pay | Admitting: Family Medicine

## 2023-05-04 ENCOUNTER — Ambulatory Visit: Payer: Self-pay | Admitting: Licensed Clinical Social Worker

## 2023-05-04 ENCOUNTER — Other Ambulatory Visit: Payer: Self-pay

## 2023-05-04 DIAGNOSIS — K760 Fatty (change of) liver, not elsewhere classified: Secondary | ICD-10-CM | POA: Diagnosis not present

## 2023-05-04 DIAGNOSIS — I959 Hypotension, unspecified: Secondary | ICD-10-CM | POA: Diagnosis not present

## 2023-05-04 DIAGNOSIS — M179 Osteoarthritis of knee, unspecified: Secondary | ICD-10-CM | POA: Diagnosis not present

## 2023-05-04 DIAGNOSIS — E872 Acidosis, unspecified: Secondary | ICD-10-CM | POA: Diagnosis not present

## 2023-05-04 DIAGNOSIS — K224 Dyskinesia of esophagus: Secondary | ICD-10-CM | POA: Diagnosis not present

## 2023-05-04 DIAGNOSIS — N289 Disorder of kidney and ureter, unspecified: Secondary | ICD-10-CM | POA: Diagnosis not present

## 2023-05-04 DIAGNOSIS — I82411 Acute embolism and thrombosis of right femoral vein: Secondary | ICD-10-CM | POA: Diagnosis not present

## 2023-05-04 DIAGNOSIS — D649 Anemia, unspecified: Secondary | ICD-10-CM | POA: Diagnosis not present

## 2023-05-04 DIAGNOSIS — Z48815 Encounter for surgical aftercare following surgery on the digestive system: Secondary | ICD-10-CM | POA: Diagnosis not present

## 2023-05-04 DIAGNOSIS — K2970 Gastritis, unspecified, without bleeding: Secondary | ICD-10-CM | POA: Diagnosis not present

## 2023-05-04 DIAGNOSIS — M109 Gout, unspecified: Secondary | ICD-10-CM | POA: Diagnosis not present

## 2023-05-04 DIAGNOSIS — R7303 Prediabetes: Secondary | ICD-10-CM | POA: Diagnosis not present

## 2023-05-04 DIAGNOSIS — R627 Adult failure to thrive: Secondary | ICD-10-CM | POA: Diagnosis not present

## 2023-05-04 DIAGNOSIS — M5416 Radiculopathy, lumbar region: Secondary | ICD-10-CM | POA: Diagnosis not present

## 2023-05-04 DIAGNOSIS — E8809 Other disorders of plasma-protein metabolism, not elsewhere classified: Secondary | ICD-10-CM | POA: Diagnosis not present

## 2023-05-04 DIAGNOSIS — K828 Other specified diseases of gallbladder: Secondary | ICD-10-CM | POA: Diagnosis not present

## 2023-05-04 DIAGNOSIS — Z431 Encounter for attention to gastrostomy: Secondary | ICD-10-CM | POA: Diagnosis not present

## 2023-05-04 DIAGNOSIS — K289 Gastrojejunal ulcer, unspecified as acute or chronic, without hemorrhage or perforation: Secondary | ICD-10-CM | POA: Diagnosis not present

## 2023-05-04 DIAGNOSIS — N3946 Mixed incontinence: Secondary | ICD-10-CM | POA: Diagnosis not present

## 2023-05-04 DIAGNOSIS — F411 Generalized anxiety disorder: Secondary | ICD-10-CM | POA: Diagnosis not present

## 2023-05-04 DIAGNOSIS — I700 Atherosclerosis of aorta: Secondary | ICD-10-CM | POA: Diagnosis not present

## 2023-05-04 DIAGNOSIS — I2510 Atherosclerotic heart disease of native coronary artery without angina pectoris: Secondary | ICD-10-CM | POA: Diagnosis not present

## 2023-05-04 DIAGNOSIS — G4733 Obstructive sleep apnea (adult) (pediatric): Secondary | ICD-10-CM | POA: Diagnosis not present

## 2023-05-04 DIAGNOSIS — I119 Hypertensive heart disease without heart failure: Secondary | ICD-10-CM | POA: Diagnosis not present

## 2023-05-04 NOTE — Patient Instructions (Signed)
 Visit Information  Thank you for taking time to visit with me today. Please don't hesitate to contact me if I can be of assistance to you.   Following are the goals we discussed today:   Goals Addressed             This Visit's Progress    Patient Stated she has decreased family support (family resides in [place] ). She has challenges in completing ADLs       Interventions:  Spoke with client via phone today about client needs Discussed program support  with RN , LCSW, Pharmacist Discussed medication procurement of client Discussed walking of client. She has a cane and a walker to use as needed Discussed sleeping issues Discussed family support. Client family resides in [place]  Sister of client is HCPOA for client Christine [name]) Discussed appetite of client. She talked about her history of bariatric surgery Discussed about mobility in her home. She has bed in her dining room , near her kitchen. Discussed dizziness of client. Provided counseling support for client. Used Active Listening techniques to hear client needs Client said she sits up in bed to watch TV She tries to do leg exercises while watching TV.  Client said physical therapist is scheduled to visit her at home today to start doing physical therapy exercises with client Client has nurse visits 2 time weekly as scheduled Discussed memory issues of client. She uses calendar to remind her of appointments. Thanked client for phone call with LCSW today Encouraged client to call LCSW as needed for SW support at [phone]         Our next appointment is by telephone on 06/01/23 at 10:00 AM  Please call the care guide team at [phone] if you need to cancel or reschedule your appointment.   If you are experiencing a Mental Health or Behavioral Health Crisis or need someone to talk to, please go to [facility] [address], [place] [phone])   The patient verbalized  understanding of instructions, educational materials, and care plan provided today and DECLINED offer to receive copy of patient instructions, educational materials, and care plan.   The patient has been provided with contact information for the care management team and has been advised to call with any health related questions or concerns.   Christine [name].Azure Barrales MSW, LCSW Licensed Visual Merchandiser [facility] Care Management [phone]

## 2023-05-04 NOTE — Patient Outreach (Signed)
  Care Management  Transitions of Care Program Transitions of Care Post-discharge week 2  05/04/2023 Name: Christine [name] MRN: [id number] DOB: [birth year]  Subjective: Christine [name] is a 73 y.o. year old female who is a primary care patient of Frann Mabel Mt, DO. The Care Management team spoke with patient by telephone to assess and address transitions of care needs.  Patient request RNCM call back in an hour or so - wants to lay down for a nap.  Plan: Additional outreach attempts will be made to reach the patient enrolled in the [facility] Program (Post Inpatient/ED Visit).  Prathik Aman Gladis BSN, Programmer, Systems / Transitions of Care [place] [facility], [facility] Direct Dial Number:  [phone]

## 2023-05-04 NOTE — Patient Outreach (Signed)
  Care Coordination   Initial Visit Note   05/04/2023 Name: Christine [name] MRN: [id number] DOB: 06/13/1950  Christine [name] is a 73 y.o. year old female who sees Frann, Mabel Mt, DO for primary care. I spoke with  Christine [name] by phone today.  What matters to the patients health and wellness today?  Patient Stated she has decreased family support (family resides in [place] ). She has challenges in completing ADLs    Goals Addressed             This Visit's Progress    Patient Stated she has decreased family support (family resides in [place] ). She has challenges in completing ADLs       Interventions:  Spoke with client via phone today about client needs Discussed program support  with RN , LCSW, Pharmacist Discussed medication procurement of client Discussed walking of client. She has a cane and a walker to use as needed Discussed sleeping issues Discussed family support. Client family resides in [place]  Sister of client is HCPOA for client Christine [name]) Discussed appetite of client. She talked about her history of bariatric surgery Discussed about mobility in her home. She has bed in her dining room , near her kitchen. Discussed dizziness of client. Provided counseling support for client. Used Active Listening techniques to hear client needs Client said she sits up in bed to watch TV She tries to do leg exercises while watching TV.  Client said physical therapist is scheduled to visit her at home today to start doing physical therapy exercises with client Client has nurse visits 2 time weekly as scheduled Discussed memory issues of client. She uses calendar to remind her of appointments. Thanked client for phone call with LCSW today Encouraged client to call LCSW as needed for SW support at [phone]         SDOH assessments and interventions completed:  Yes  SDOH Interventions Today    Flowsheet Row Most Recent Value  SDOH  Interventions   Depression Interventions/Treatment  Counseling  Physical Activity Interventions Other (Comments)  [plans to start in home physical therapy this week]  Stress Interventions Provide Counseling        Care Coordination Interventions:  Yes, provided   Interventions Today    Flowsheet Row Most Recent Value  Chronic Disease   Chronic disease during today's visit Other  [spoke with client about client needs]  General Interventions   General Interventions Discussed/Reviewed General Interventions Discussed, Community Resources  Education Interventions   Education Provided Provided Education  Provided Verbal Education On [employer]  Mental Health Interventions   Mental Health Discussed/Reviewed Coping Strategies  [tryiing to cope with medical needs]  Nutrition Interventions   Nutrition Discussed/Reviewed Nutrition Discussed  Pharmacy Interventions   Pharmacy Dicussed/Reviewed Pharmacy Topics Discussed  Safety Interventions   Safety Discussed/Reviewed Fall Risk        Follow up plan: Follow up call scheduled for 06/01/23 at 10:00 AM    Encounter Outcome:  Patient Visit Completed   Ozell RAMAN.Meriem Lemieux MSW, LCSW Licensed Visual Merchandiser [facility] Care Management [phone]

## 2023-05-04 NOTE — Telephone Encounter (Signed)
 Copied from CRM [id number]. Topic: Referral - Status >> May 04, 2023  1:42 PM Viola F wrote: Reason for CRM: Patient called to confirm that the paperwork for home health is NOT for physical therapy. She needs paperwork completed to request a home maker, someone to help her around the house since she is unable to do much for herself.

## 2023-05-04 NOTE — Telephone Encounter (Signed)
 Called pt back about services that she is needing, that she will to contact her insurance and company for the CNA service. Pt was advised that CNA service may not be covered by insurance, but she will need to reach to them and get the name and fax number to us , we will fax office notes to them. Pt was advised that we haven't received any paperwork from her insurance yet. Pt stated understand and she will reach out to them.

## 2023-05-04 NOTE — Telephone Encounter (Signed)
 Called pt was advised and she stated understand. Lab appt scheduled. Pt advised we haven't seen form but the [facility] orders/referral was received signed. We sent there back.

## 2023-05-05 ENCOUNTER — Telehealth: Payer: Self-pay

## 2023-05-05 DIAGNOSIS — E8809 Other disorders of plasma-protein metabolism, not elsewhere classified: Secondary | ICD-10-CM | POA: Diagnosis not present

## 2023-05-05 DIAGNOSIS — K224 Dyskinesia of esophagus: Secondary | ICD-10-CM | POA: Diagnosis not present

## 2023-05-05 DIAGNOSIS — K2970 Gastritis, unspecified, without bleeding: Secondary | ICD-10-CM | POA: Diagnosis not present

## 2023-05-05 DIAGNOSIS — K828 Other specified diseases of gallbladder: Secondary | ICD-10-CM | POA: Diagnosis not present

## 2023-05-05 DIAGNOSIS — Z48815 Encounter for surgical aftercare following surgery on the digestive system: Secondary | ICD-10-CM | POA: Diagnosis not present

## 2023-05-05 DIAGNOSIS — M109 Gout, unspecified: Secondary | ICD-10-CM | POA: Diagnosis not present

## 2023-05-05 DIAGNOSIS — N3946 Mixed incontinence: Secondary | ICD-10-CM | POA: Diagnosis not present

## 2023-05-05 DIAGNOSIS — I82411 Acute embolism and thrombosis of right femoral vein: Secondary | ICD-10-CM | POA: Diagnosis not present

## 2023-05-05 DIAGNOSIS — G4733 Obstructive sleep apnea (adult) (pediatric): Secondary | ICD-10-CM | POA: Diagnosis not present

## 2023-05-05 DIAGNOSIS — I700 Atherosclerosis of aorta: Secondary | ICD-10-CM | POA: Diagnosis not present

## 2023-05-05 DIAGNOSIS — K289 Gastrojejunal ulcer, unspecified as acute or chronic, without hemorrhage or perforation: Secondary | ICD-10-CM | POA: Diagnosis not present

## 2023-05-05 DIAGNOSIS — N289 Disorder of kidney and ureter, unspecified: Secondary | ICD-10-CM | POA: Diagnosis not present

## 2023-05-05 DIAGNOSIS — R627 Adult failure to thrive: Secondary | ICD-10-CM | POA: Diagnosis not present

## 2023-05-05 DIAGNOSIS — Z431 Encounter for attention to gastrostomy: Secondary | ICD-10-CM | POA: Diagnosis not present

## 2023-05-05 DIAGNOSIS — M5416 Radiculopathy, lumbar region: Secondary | ICD-10-CM | POA: Diagnosis not present

## 2023-05-05 DIAGNOSIS — K760 Fatty (change of) liver, not elsewhere classified: Secondary | ICD-10-CM | POA: Diagnosis not present

## 2023-05-05 DIAGNOSIS — I119 Hypertensive heart disease without heart failure: Secondary | ICD-10-CM | POA: Diagnosis not present

## 2023-05-05 DIAGNOSIS — D649 Anemia, unspecified: Secondary | ICD-10-CM | POA: Diagnosis not present

## 2023-05-05 DIAGNOSIS — E872 Acidosis, unspecified: Secondary | ICD-10-CM | POA: Diagnosis not present

## 2023-05-05 DIAGNOSIS — R7303 Prediabetes: Secondary | ICD-10-CM | POA: Diagnosis not present

## 2023-05-05 DIAGNOSIS — F411 Generalized anxiety disorder: Secondary | ICD-10-CM | POA: Diagnosis not present

## 2023-05-05 DIAGNOSIS — M179 Osteoarthritis of knee, unspecified: Secondary | ICD-10-CM | POA: Diagnosis not present

## 2023-05-05 DIAGNOSIS — I959 Hypotension, unspecified: Secondary | ICD-10-CM | POA: Diagnosis not present

## 2023-05-05 DIAGNOSIS — I2510 Atherosclerotic heart disease of native coronary artery without angina pectoris: Secondary | ICD-10-CM | POA: Diagnosis not present

## 2023-05-05 NOTE — Patient Outreach (Signed)
 Care Management  Transitions of Care Program Transitions of Care Post-discharge week 3   05/05/2023 Name: Christine [name] MRN: [id number] DOB: 1950-10-13  Subjective: Christine [name] is a 73 y.o. year old female who is a primary care patient of Frann Mabel Mt, DO. The Care Management team Engaged with patient Engaged with patient by telephone to assess and address transitions of care needs.   Consent to Services:  Patient was given information about care management services, agreed to services, and gave verbal consent to participate.   Assessment:   Patient reports that she is feeling better today than she was yesterday.  She states that the dizzy spell she was experiencing today has passed.  She states that the dizzy spells come and go all the time.  She just tends to lay down and rest until it subsides.  Ms. Hundal reports that her feeding tube was accidentally dislodged this morning and she is glad that it is gone.  The Home Health RN came by to see her today and asked her if she wanted the tube replaced and she told her NO, she wants to leave it out as she has not used it for some time (this is also supported in her last hospital discharge summary.  The patient reports that she eats small frequent meals and supplements with a nutritional shake as needed.  She feels she drinks at least 6 glasses of fluid a day.  She denies any nausea or vomiting after meals or drinking.  Patient continues to receive home health physical therapy, occupational therapy and skilled nursing visits from [facility].  Her PCP is working on getting the form completed so that she can get home health aid services as well.  Patient sounds in good spirits and denies any unmet needs at this time.       SDOH Interventions    Flowsheet Row Care Coordination from 05/04/2023 in [facility] Community Care Coordination Telephone from 04/28/2023 in [facility] DEPARTMENT Telephone  from 09/17/2022 in [facility] Community Care Coordination Admission (Discharged) from 09/14/2022 in [facility] [facility] Clinical Support from 01/11/2022 in [facility] Primary Care at [facility] Clinical Support from 01/10/2021 in [facility] Primary Care at [facility]  SDOH Interventions        Food Insecurity Interventions -- Intervention Not Indicated Intervention Not Indicated Inpatient TOC -- Intervention Not Indicated  Housing Interventions -- Intervention Not Indicated -- Inpatient TOC -- Intervention Not Indicated  Transportation Interventions -- Intervention Not Indicated Intervention Not Indicated  [drives self] Inpatient TOC -- Intervention Not Indicated  Utilities Interventions -- Intervention Not Indicated -- Inpatient TOC Intervention Not Indicated --  Depression Interventions/Treatment  Counseling -- -- -- -- --  Financial Strain Interventions -- -- -- -- -- Intervention Not Indicated  Physical Activity Interventions Other (Comments)  [plans to start in home physical therapy this week] -- -- -- -- Intervention Not Indicated  Stress Interventions Provide Counseling -- -- -- -- Intervention Not Indicated  Social Connections Interventions -- -- -- -- -- Intervention Not Indicated        Goals Addressed             This Visit's Progress    TOC Care Plan       Transition of Care Current Barriers:  Medication management  Diet/Nutrition/Food Resources  Provider appointments  Home Health services  Transportation  Knowledge Deficits related to plan of care for 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),  Gastrostomy Tube, Deep Vein Thrombosis  Lacks caregiver support- MSW services engaged to provide patient with support and community resources   RNCM Clinical Goal(s):  Patient will work with the Care Management team over the next 30 days to address Transition of Care Barriers: Diet/Nutrition/Food Resources Provider  appointments Confirm Home Health services in place Transportation Promote adequate nutrition to promote healthy weight and nutritional status take all medications exactly as prescribed and will call provider for medication related questions as evidenced by review of EMR and documentation of patient asking appropriate questions regarding her medications Patient will attend all scheduled medical appointments: By review of EMR as evidenced by attendance at PCP, Specialist appointments demonstrate Improved health management independence and improved quality of life by focusing on nutrition, cognition and functional therapies and intervention as evidenced by report of adequate daily oral intake of food and fluids, attain.maintain ideal body weight per her doctors and report increased functional independence. not experience hospital admission as evidenced by review of EMR in 30 days through collaboration with RN Care manager, provider, and care team.   Interventions: Evaluation of current treatment plan related to  self management and patient's adherence to plan as established by provider. Confirmed [facility] is engaged with and providing home PT, RN, and OT services.  [employer] is working along with [employer] and PCP office to have the appropriate form completed so the patient may have intermittent home health aid services to assist with ADLS and some IADLS. RNCM performed a complete medication review and confirmed patient takes all of her medications orally.  She reports not having used her feeding tube in awhile.  Transitions of Care:   Doctor Visits  - discussed the importance of doctor visits.  Upcoming appointments and lab work reviewed.  Patient understands and plans to attend. Contacted provider for patient needs . In-basket message sent to patient's primary care physician to notify that the patient states she dislodged her feeding tube this morning and does not want it  replaced.  Home Health RN visit today whom patient states she also made aware of the feeding tube being out and not wanting it replaced, as she has not used it in awhile. Patient provided verbal education to try not to overexert herself, especially when she is not working with therapy, as this may trigger some of her dizzy spells.  Patient reports she has had dizzy spells off and on for some time they just come and go.   Reviewed/discussed signs and symptoms of infection or bleeding from her feeding tube site.  Patient states it looks great but verbalizes understanding. RN re-assessed her nutritional status- pt voices that her appetite is much better -eating small frequent meals and oral fluid intake quantity sufficient without nausea, vomiting or diarrhea.   Assessed status of G-tube- patient reports it was dislodged today and she is glad to have it gone.  States [facility] asked her if she wanted it replaced and the patient stated she did not want it replaced because she has not needed it.   Transportation - Patient is using [employer] who take her to her doctor appointments with at least a 24 hrs notice.  She states the service is great because they are willing to assist her in/out of building,housing and vehicle without any problems.   Patient Goals/Self-Care Activities: Participate in Transition of Care Program/Attend TOC scheduled calls Take all medications as prescribed Attend all scheduled provider appointments Call  provider office for new concerns or questions   Follow Up Plan:  Telephone follow up appointment with care management team member scheduled for:  05/12/23 at 1 PM The patient has been provided with contact information for the care management team and has been advised to call with any health related questions or concerns.          Plan: Telephone follow up appointment with care management team member scheduled for:  05/12/23 at 1 PM The patient has been  provided with contact information for the care management team and has been advised to call with any health related questions or concerns.   Grady Lucci Gladis BSN, Programmer, Systems / Transitions of Care [facility], [facility] Direct Dial Number:  [phone]

## 2023-05-05 NOTE — Telephone Encounter (Signed)
 Copied from CRM [id number]. Topic: Clinical - Medical Advice >> May 05, 2023 10:03 AM Cherylynn B wrote: Reason for CRM: Patient states that she needs Dr Gena nurse to discuss with doctor about her insurance issue that she has been having for home care. Wants to be advised on next steps as insurance needs certain forms filled out by the doctor to get approved. Callback [phone]

## 2023-05-05 NOTE — Telephone Encounter (Signed)
 Copied from CRM [id number]. Topic: General - Phone/Fax/Address >> May 05, 2023  9:47 AM Drema Balzarine wrote: Christine [name] from [facility] called to confirm that we received home health orders via fax - I advised her to refax and office will keep a look out for it

## 2023-05-06 ENCOUNTER — Telehealth: Payer: Self-pay

## 2023-05-06 ENCOUNTER — Encounter: Payer: Self-pay | Admitting: Family Medicine

## 2023-05-06 NOTE — Telephone Encounter (Signed)
 Pt message sent to her.

## 2023-05-06 NOTE — Telephone Encounter (Signed)
 Pt was advised forms was received and signed, faxed back.

## 2023-05-06 NOTE — Telephone Encounter (Signed)
 Called pt was advised we received HH ordered and it was signed and faxed back.

## 2023-05-09 ENCOUNTER — Ambulatory Visit: Payer: Self-pay | Admitting: Family Medicine

## 2023-05-09 ENCOUNTER — Other Ambulatory Visit: Payer: Medicare HMO

## 2023-05-09 NOTE — Telephone Encounter (Signed)
 Copied from CRM [id number]. Topic: Clinical - Red Word Triage >> May 09, 2023  9:33 AM Robinson DEL wrote: Red Word that prompted transfer to Nurse Triage: Patient states she's having some dizziness. No matter what she's doing  Chief Complaint: Dizziness Symptoms: Dizziness and lightheadedness Frequency: Ongoing Pertinent Negatives: Patient denies relief Disposition: [] ED /[] Urgent Care (no appt availability in office) / [x] Appointment(In office/virtual)/ []  [facility]/ [] Home Care/ [x] Refused Recommended Disposition /[] [facility]/ []  Follow-up with PCP Additional Notes: Patient called in reporting dizziness. Patient stated that dizziness started after a surgery she had in May of last year. Patient reported that lightheadedness is also present. Patient reported that she has a hard time walking, but denied recent fainting and falling while walking. Patient denied any additional symptoms. Patient denied history of vertigo. Patient stated that lying down in the dark and the early morning hours exacerbate her symptoms. Patient stated that she has been seen for this by her provider before. Advised patient to see provider within 3 days.  Offered patient multiple available appointments, but patient refused all of them. Patient stated that she can't do any time before 1pm and she does not want to see anyone other than her current provider.  Patient asked if she could come in on Friday and this RN politely explained why it is recommended that she is seen within 3 days. This RN attempted to work with the patient to find an appointment that worked for her. Patient became upset and hung up the phone.   Reason for Disposition  [1] MODERATE dizziness (e.g., interferes with normal activities) AND [2] has been evaluated by doctor (or NP/PA) for this  Answer Assessment - Initial Assessment Questions 1. DESCRIPTION: Describe your dizziness.     Patient states everything is spinning  2.  LIGHTHEADED: Do you feel lightheaded? (e.g., somewhat faint, woozy, weak upon standing)     Patient states she does feel lightheaded, but denies fainting and falling over  3. VERTIGO: Do you feel like either you or the room is spinning or tilting? (i.e. vertigo)     Denies  4. SEVERITY: How bad is it?  Do you feel like you are going to faint? Can you stand and walk?   - MILD: Feels slightly dizzy, but walking normally.   - MODERATE: Feels unsteady when walking, but not falling; interferes with normal activities (e.g., school, work).   - SEVERE: Unable to walk without falling, or requires assistance to walk without falling; feels like passing out now.      Patient states she is barely able to walk normally, but has not fallen yet  5. ONSET:  When did the dizziness begin?     Patient states dizziness started after surgery in May of last year  6. AGGRAVATING FACTORS: Does anything make it worse? (e.g., standing, change in head position)     Patient states laying down in the dark and the early morning hours make it worse  8. CAUSE: What do you think is causing the dizziness?     Patient states her many surgeries have cause this  9. RECURRENT SYMPTOM: Have you had dizziness before? If Yes, ask: When was the last time? What happened that time?     Yes, patient states dizziness has been present following her surgeries over the past year  10. OTHER SYMPTOMS: Do you have any other symptoms? (e.g., fever, chest pain, vomiting, diarrhea, bleeding)       Denies  Protocols used: Dizziness -  Lightheadedness-A-AH

## 2023-05-09 NOTE — Telephone Encounter (Signed)
 Called pt was advised we can see you tomorrow, appt scheduled for 315 follow up for dizziness.

## 2023-05-10 ENCOUNTER — Ambulatory Visit (INDEPENDENT_AMBULATORY_CARE_PROVIDER_SITE_OTHER): Payer: Medicare HMO | Admitting: Family Medicine

## 2023-05-10 ENCOUNTER — Emergency Department (HOSPITAL_BASED_OUTPATIENT_CLINIC_OR_DEPARTMENT_OTHER)
Admission: EM | Admit: 2023-05-10 | Discharge: 2023-05-11 | Disposition: A | Discharge status: Home / Self Care | Payer: Medicare HMO | Attending: Emergency Medicine | Admitting: Emergency Medicine

## 2023-05-10 ENCOUNTER — Encounter: Payer: Self-pay | Admitting: Family Medicine

## 2023-05-10 ENCOUNTER — Emergency Department (HOSPITAL_COMMUNITY): Payer: Medicare HMO

## 2023-05-10 ENCOUNTER — Other Ambulatory Visit: Payer: Self-pay

## 2023-05-10 ENCOUNTER — Emergency Department (HOSPITAL_BASED_OUTPATIENT_CLINIC_OR_DEPARTMENT_OTHER): Payer: Medicare HMO

## 2023-05-10 ENCOUNTER — Encounter (HOSPITAL_BASED_OUTPATIENT_CLINIC_OR_DEPARTMENT_OTHER): Payer: Self-pay | Admitting: Radiology

## 2023-05-10 VITALS — BP 134/68 | HR 78 | Temp 98.0°F | Resp 16 | Ht 61.0 in | Wt 198.0 lb

## 2023-05-10 DIAGNOSIS — K760 Fatty (change of) liver, not elsewhere classified: Secondary | ICD-10-CM | POA: Diagnosis not present

## 2023-05-10 DIAGNOSIS — I700 Atherosclerosis of aorta: Secondary | ICD-10-CM | POA: Diagnosis not present

## 2023-05-10 DIAGNOSIS — K2970 Gastritis, unspecified, without bleeding: Secondary | ICD-10-CM | POA: Diagnosis not present

## 2023-05-10 DIAGNOSIS — I10 Essential (primary) hypertension: Secondary | ICD-10-CM | POA: Insufficient documentation

## 2023-05-10 DIAGNOSIS — I82411 Acute embolism and thrombosis of right femoral vein: Secondary | ICD-10-CM | POA: Diagnosis not present

## 2023-05-10 DIAGNOSIS — K289 Gastrojejunal ulcer, unspecified as acute or chronic, without hemorrhage or perforation: Secondary | ICD-10-CM | POA: Diagnosis not present

## 2023-05-10 DIAGNOSIS — R42 Dizziness and giddiness: Secondary | ICD-10-CM | POA: Diagnosis not present

## 2023-05-10 DIAGNOSIS — N289 Disorder of kidney and ureter, unspecified: Secondary | ICD-10-CM | POA: Diagnosis not present

## 2023-05-10 DIAGNOSIS — R55 Syncope and collapse: Secondary | ICD-10-CM | POA: Diagnosis not present

## 2023-05-10 DIAGNOSIS — G4733 Obstructive sleep apnea (adult) (pediatric): Secondary | ICD-10-CM | POA: Diagnosis not present

## 2023-05-10 DIAGNOSIS — M5416 Radiculopathy, lumbar region: Secondary | ICD-10-CM | POA: Diagnosis not present

## 2023-05-10 DIAGNOSIS — Z79899 Other long term (current) drug therapy: Secondary | ICD-10-CM | POA: Diagnosis not present

## 2023-05-10 DIAGNOSIS — I6782 Cerebral ischemia: Secondary | ICD-10-CM | POA: Diagnosis not present

## 2023-05-10 DIAGNOSIS — F411 Generalized anxiety disorder: Secondary | ICD-10-CM | POA: Diagnosis not present

## 2023-05-10 DIAGNOSIS — K224 Dyskinesia of esophagus: Secondary | ICD-10-CM | POA: Diagnosis not present

## 2023-05-10 DIAGNOSIS — Z48815 Encounter for surgical aftercare following surgery on the digestive system: Secondary | ICD-10-CM | POA: Diagnosis not present

## 2023-05-10 DIAGNOSIS — M109 Gout, unspecified: Secondary | ICD-10-CM | POA: Diagnosis not present

## 2023-05-10 DIAGNOSIS — E8809 Other disorders of plasma-protein metabolism, not elsewhere classified: Secondary | ICD-10-CM | POA: Diagnosis not present

## 2023-05-10 DIAGNOSIS — Z431 Encounter for attention to gastrostomy: Secondary | ICD-10-CM | POA: Diagnosis not present

## 2023-05-10 DIAGNOSIS — Z7901 Long term (current) use of anticoagulants: Secondary | ICD-10-CM | POA: Diagnosis not present

## 2023-05-10 DIAGNOSIS — M179 Osteoarthritis of knee, unspecified: Secondary | ICD-10-CM | POA: Diagnosis not present

## 2023-05-10 DIAGNOSIS — I119 Hypertensive heart disease without heart failure: Secondary | ICD-10-CM | POA: Diagnosis not present

## 2023-05-10 DIAGNOSIS — I2510 Atherosclerotic heart disease of native coronary artery without angina pectoris: Secondary | ICD-10-CM | POA: Diagnosis not present

## 2023-05-10 DIAGNOSIS — I959 Hypotension, unspecified: Secondary | ICD-10-CM | POA: Diagnosis not present

## 2023-05-10 DIAGNOSIS — R7303 Prediabetes: Secondary | ICD-10-CM | POA: Diagnosis not present

## 2023-05-10 DIAGNOSIS — K828 Other specified diseases of gallbladder: Secondary | ICD-10-CM | POA: Diagnosis not present

## 2023-05-10 DIAGNOSIS — E872 Acidosis, unspecified: Secondary | ICD-10-CM | POA: Diagnosis not present

## 2023-05-10 DIAGNOSIS — R627 Adult failure to thrive: Secondary | ICD-10-CM | POA: Diagnosis not present

## 2023-05-10 DIAGNOSIS — D649 Anemia, unspecified: Secondary | ICD-10-CM | POA: Diagnosis not present

## 2023-05-10 DIAGNOSIS — N3946 Mixed incontinence: Secondary | ICD-10-CM | POA: Diagnosis not present

## 2023-05-10 LAB — CBG MONITORING, ED
Glucose-Capillary: 68 mg/dL — ABNORMAL LOW (ref 70–99)
Glucose-Capillary: 63 mg/dL — ABNORMAL LOW (ref 70–99)
Glucose-Capillary: 70 mg/dL (ref 70–99)

## 2023-05-10 LAB — CBC
HCT: 32.2 % — ABNORMAL LOW (ref 36.0–46.0)
Hemoglobin: 10 g/dL — ABNORMAL LOW (ref 12.0–15.0)
MCH: 29.2 pg (ref 26.0–34.0)
MCHC: 31.1 g/dL (ref 30.0–36.0)
MCV: 93.9 fL (ref 80.0–100.0)
Platelets: 358 10*3/uL (ref 150–400)
RBC: 3.43 MIL/uL — ABNORMAL LOW (ref 3.87–5.11)
RDW: 16.6 % — ABNORMAL HIGH (ref 11.5–15.5)
WBC: 5.1 10*3/uL (ref 4.0–10.5)
nRBC: 0 % (ref 0.0–0.2)

## 2023-05-10 LAB — BASIC METABOLIC PANEL
Anion gap: 8 (ref 5–15)
BUN: 22 mg/dL (ref 8–23)
CO2: 20 mmol/L — ABNORMAL LOW (ref 22–32)
Calcium: 9.1 mg/dL (ref 8.9–10.3)
Chloride: 111 mmol/L (ref 98–111)
Creatinine, Ser: 0.85 mg/dL (ref 0.44–1.00)
GFR, Estimated: >60 mL/min (ref >60)
Glucose, Bld: 92 mg/dL (ref 70–99)
Potassium: 3.9 mmol/L (ref 3.5–5.1)
Sodium: 139 mmol/L (ref 135–145)

## 2023-05-10 MED ORDER — MECLIZINE HCL 25 MG PO TABS
25.0000 mg | ORAL_TABLET | Freq: Once | ORAL | Status: AC
  Administered 2023-05-10: 25 mg via ORAL
  Filled 2023-05-10: qty 1

## 2023-05-10 MED ORDER — LACTATED RINGERS IV BOLUS: 500.0000 mL | Freq: Once | INTRAVENOUS | Status: DC

## 2023-05-10 NOTE — ED Notes (Signed)
 MRI notified of patient arrival to facility.

## 2023-05-10 NOTE — ED Triage Notes (Signed)
 Pt arrives from [facility] for MRI.

## 2023-05-10 NOTE — ED Notes (Signed)
 RN Misty informed of pt CBG 63. OK for pt to have something to eat. Pt requested frozen Mac and Cheese. Pt given the same.

## 2023-05-10 NOTE — Progress Notes (Signed)
 Chief Complaint  Patient presents with   Dizziness    Discuss dizziness     Christine [name] is 73 y.o. pt here for dizziness.  Duration: 2 months Pass out? No Spinning? Yes; constant Recent illness/fever? No Headache? No Neurologic signs? No Change in PO intake? Not significant Palpitations? No  Past Medical History:  Diagnosis Date   Allergy    dust, pollen, sulfa, prednisone   Anemia    Anxiety    Arthritis    knees (04/26/2016)   Colon polyps    benign per pt   Coronary artery disease    mild per 2015 cath in [place]  (OM1 30%, RCA 30%)   GERD (gastroesophageal reflux disease)    Gout    History of hiatal hernia    Hyperlipidemia 05/20/2021   Hypertension    Migraine    none since early /2017 (05/06/2016)   Obesity    OSA on CPAP    uses CPAP   Pre-diabetes    Pre-operative cardiovascular examination 08/22/2008   Renal insufficiency 11/09/2022   Vasculitis (HCC)    Bilateral    Family History  Problem Relation Age of Onset   Diabetes Mother    Heart disease Mother    High blood pressure Mother    Asthma Mother    Arthritis Mother    Diabetes Father    Heart disease Father    High blood pressure Father    Asthma Father    Diabetes Sister    Alcohol abuse Brother    Drug abuse Brother    Arthritis Sister    Diabetes Sister    Varicose Veins Sister    Colon cancer Neg Hx    Esophageal cancer Neg Hx    Rectal cancer Neg Hx     Allergies as of 05/10/2023       Reactions   Prednisone Swelling   Legs swell    Tape Hives   Sulfa Antibiotics Rash        Medication List        Accurate as of May 10, 2023  3:31 PM. If you have any questions, ask your nurse or doctor.          acetaminophen  325 MG tablet Commonly known as: TYLENOL  Take 650 mg by mouth every 4 (four) hours as needed for mild pain or moderate pain.   ascorbic acid  500 MG tablet Commonly known as: VITAMIN C  Place 1 tablet (500 mg total) into feeding tube  daily.   colchicine  0.6 MG tablet Take 1 tablet (0.6 mg total) by mouth daily.   copper  tablet Place 1 tablet (2 mg total) into feeding tube daily.   Eliquis  5 MG Tabs tablet Generic drug: apixaban  Place 1 tablet (5 mg total) into feeding tube 2 (two) times daily.   feeding supplement (GLUCERNA SHAKE) Liqd Take 237 mLs by mouth daily.   ferrous sulfate  325 (65 FE) MG EC tablet Take 1 tablet (325 mg total) by mouth in the morning and at bedtime.   furosemide  20 MG tablet Commonly known as: LASIX  Take 1 tablet (20 mg total) by mouth daily.   multivitamin with minerals Tabs tablet Place 1 tablet into feeding tube daily.   ondansetron  4 MG tablet Commonly known as: ZOFRAN  Place 1 tablet (4 mg total) into feeding tube every 6 (six) hours as needed for nausea.   pantoprazole  40 MG tablet Commonly known as: Protonix  Take 1 tablet (40 mg total) by mouth daily.   polyethylene  glycol powder 17 GM/SCOOP powder Commonly known as: MiraLax  Take 17 g by mouth 2 (two) times daily as needed for mild constipation.   senna-docusate 8.6-50 MG tablet Commonly known as: Senokot-S Take 1 tablet by mouth 2 (two) times daily between meals as needed for mild constipation.   thiamine  100 MG tablet Commonly known as: VITAMIN B1 Take 1 tablet (100 mg total) by mouth daily.   vitamin D3 25 MCG tablet Commonly known as: CHOLECALCIFEROL  Place 2 tablets (2,000 Units total) into feeding tube daily.        BP 134/68   Pulse 78   Temp 98 F (36.7 C) (Oral)   Resp 16   Ht 5' 1 (1.549 m)   Wt 198 lb (89.8 kg)   SpO2 95%   BMI 37.41 kg/m  General: Awake, alert, appears stated age Eyes: PERRLA, EOMi Heart: RRR, no murmurs, no carotid bruits Lungs: CTAB, no accessory muscle use MSK: 5/5 strength throughout, gait normal Neuro: No cerebellar signs, gait very unstable, uses cane when not in wheelchair, Dix-Hall-Pike+ on R. 4/5 hip flexion b/l, grip strength adequate Psych: Age appropriate  judgment and insight, normal mood and affect  Dizziness  Constant dizziness, concern for cerebellar etiology vs refractory BPPV. Discussed case w ED doc before sending her to ED downstairs. She was transported via wheelchair in stable condition.  F/u prn. Pt voiced understanding and agreement to the plan.  I spent 32 min w the pt discussing the above plan in addition to coordinating care with the ER team and reviewing her chart on the same day of the visit.   Mabel Mt Epworth, DO 05/10/23 3:31 PM

## 2023-05-10 NOTE — ED Provider Notes (Addendum)
 Transferred from med [facility] by Dr. Ozell Riff (Dr. Rocky Massy is attending). Please see prior provider note for more detail.   Briefly: Patient is 73 y.o. presenting for persistent dizziness since her surgery for a perforated peptic ulcer in November of this past year  DDX: concern for  stroke, BPPV, hypoglycemia, dehydration, cardiogenic etiologies   Plan: Transferred here for MRI to rule out stroke.  If that is negative patient can be discharged home with outpatient follow-up.  Also continue management of her vertiginous symptoms while here in the ED.   Physical Exam   Vitals:   05/10/23 2000 05/10/23 2214  BP: (!) 149/67 (!) 146/72  Pulse: 84 86  Resp: 20 20  Temp: 97.7 F (36.5 C) 97.8 F (36.6 C)  SpO2: 100% 95%    CONSTITUTIONAL:  well-appearing, NAD NEURO:  Alert and oriented x 3, CN 3-12 grossly intact EYES:  eyes equal and reactive ENT/NECK:  Supple, no stridor  CARDIO:  regular rate and rhythm, appears well-perfused  PULM:  No respiratory distress, CTAB  GI/GU:  non-distended, soft MSK/SPINE:  No gross deformities, no edema, moves all extremities  SKIN:  no rash, atraumatic   *Additional and/or pertinent findings included in MDM below    Procedures  Procedures  ED Course / MDM   Clinical Course as of 05/11/23 0058  Wed May 11, 2023  0058 MR BRAIN WO CONTRAST [JR]    Clinical Course User Index [JR] Lang Norleen POUR, PA-C   Medical Decision Making Amount and/or Complexity of Data Reviewed Labs: ordered. Radiology: ordered. Decision-making details documented in ED Course.   MRI negative. On reassessment, patient remained well appearing, NAD, and HD stable. Also states dizziness had improved somewhat. Advised follow up with her PCP. Discussed return precautions. Discharged. Sent meclizine  to pharmacy.     Lang Norleen POUR, PA-C 05/11/23 0112    Jerrol Agent, MD 05/11/23 [phone]

## 2023-05-10 NOTE — ED Notes (Signed)
 Pt had LR infusing upon arrival. 500cc bolus was complete.

## 2023-05-10 NOTE — ED Notes (Signed)
 [facility] called for transport.

## 2023-05-10 NOTE — ED Notes (Signed)
 Pt. Reports she does mostly lying on her couch at home since Nov. 2024 and has dizziness.  Pt. States she is home bound with no one to help her and has her living room as her life room due to the need for kitchen access and uses her potty chair and her cane for help to walk.

## 2023-05-10 NOTE — ED Provider Notes (Signed)
 [facility] Provider Note   CSN: [id number] Arrival date & time: 05/10/23  1533     History  Chief Complaint  Patient presents with   Dizziness   Christine [name] is a 73 y.o. female with a PMH of peptic ulcer perforation s/p repair in 02/2023, esophageal dysmotility, gastric bypass in 08/2022, failure to thrive s/p g-tube placement and subsequent dislodgement, HTN, HLD, OSA.   She reports persistent dizziness since her surgery for a perforated peptic ulcer in November.  She states the dizziness used to come and go but now occurs almost constantly.  It is worsened by darkness and rotating her head.  She denies any tinnitus or hearing loss. Denies any headache, nausea/vomiting, double vision, vision loss, new weakness or numbness, speech difficulties. She does have new imbalance due to the dizziness which she says prevents her from taking care of herself at home.  She denies any history of vertigo or dizziness prior to her recent surgery.   She also says she has not been eating much at all, maybe one meal a day. She has an appetite but has been unable to ambulate and prepare food for herself at home. Of note, she previously had a g-tube which was placed after her surgery in November. It became dislodged in December and was replaced by IR. She says it came dislodged again last week and was not replaced by her home health nurse who changes her abdominal dressing since she has not been using it.     Home Medications Prior to Admission medications   Medication Sig Start Date End Date Taking? Authorizing Provider  acetaminophen  (TYLENOL ) 325 MG tablet Take 650 mg by mouth every 4 (four) hours as needed for mild pain or moderate pain.    [provider]  apixaban  (ELIQUIS ) 5 MG TABS tablet Place 1 tablet (5 mg total) into feeding tube 2 (two) times daily. 04/22/23   Gonfa, Taye T, MD  ascorbic acid  (VITAMIN C ) 500 MG tablet Place 1 tablet  (500 mg total) into feeding tube daily. 04/22/23   Gonfa, Taye T, MD  colchicine  0.6 MG tablet Take 1 tablet (0.6 mg total) by mouth daily. 04/22/23   Gonfa, Taye T, MD  feeding supplement, GLUCERNA SHAKE, (GLUCERNA SHAKE) LIQD Take 237 mLs by mouth daily. 04/22/23 05/22/23  Gonfa, Taye T, MD  ferrous sulfate  325 (65 FE) MG EC tablet Take 1 tablet (325 mg total) by mouth in the morning and at bedtime. 04/22/23   Gonfa, Taye T, MD  furosemide  (LASIX ) 20 MG tablet Take 1 tablet (20 mg total) by mouth daily. 04/22/23   Gonfa, Taye T, MD  Multiple Vitamin (MULTIVITAMIN WITH MINERALS) TABS tablet Place 1 tablet into feeding tube daily. 04/22/23   Gonfa, Taye T, MD  ondansetron  (ZOFRAN ) 4 MG tablet Place 1 tablet (4 mg total) into feeding tube every 6 (six) hours as needed for nausea. 04/17/23   Sherrill Cable Latif, DO  pantoprazole  (PROTONIX ) 40 MG tablet Take 1 tablet (40 mg total) by mouth daily. 04/22/23 05/22/23  Gonfa, Taye T, MD  polyethylene glycol powder (MIRALAX ) 17 GM/SCOOP powder Take 17 g by mouth 2 (two) times daily as needed for mild constipation. 04/22/23   Gonfa, Taye T, MD  senna-docusate (SENOKOT-S) 8.6-50 MG tablet Take 1 tablet by mouth 2 (two) times daily between meals as needed for mild constipation. 04/22/23   Gonfa, Taye T, MD  thiamine  (VITAMIN B-1) 100 MG tablet Take 1  tablet (100 mg total) by mouth daily. 04/22/23 05/22/23  Gonfa, Taye T, MD      Allergies    Prednisone, Tape, and Sulfa antibiotics    Review of Systems   Review of Systems  Constitutional: Negative.   HENT: Negative.    Eyes: Negative.   Respiratory: Negative.    Cardiovascular: Negative.   Gastrointestinal:  Positive for diarrhea. Negative for abdominal pain.  Endocrine: Negative.   Genitourinary: Negative.   Musculoskeletal: Negative.   Skin: Negative.   Allergic/Immunologic: Negative.   Neurological:  Positive for dizziness.  Hematological: Negative.   Psychiatric/Behavioral: Negative.      Physical Exam Updated Vital Signs BP (!) 149/67   Pulse 84   Temp 97.7 F (36.5 C)   Resp 20   Ht 5' 1 (1.549 m)   Wt 104.3 kg   SpO2 100%   BMI 43.46 kg/m  Physical Exam Constitutional:      Appearance: Normal appearance.  HENT:     Mouth/Throat:     Mouth: Mucous membranes are dry.  Eyes:     Extraocular Movements: Extraocular movements intact.     Pupils: Pupils are equal, round, and reactive to light.  Cardiovascular:     Rate and Rhythm: Normal rate and regular rhythm.     Pulses: Normal pulses.     Heart sounds: Normal heart sounds.  Pulmonary:     Effort: Pulmonary effort is normal.     Breath sounds: Normal breath sounds.  Abdominal:     General: Abdomen is flat.     Comments: Bandage over abdominal wound, clean and dry  Musculoskeletal:        General: Normal range of motion.  Skin:    Findings: Lesion present.     Comments: Small right gluteal pressure lesion  Neurological:     General: No focal deficit present.     Mental Status: She is alert and oriented to person, place, and time.     Cranial Nerves: No cranial nerve deficit.     Sensory: No sensory deficit.     Motor: No weakness.     Coordination: Coordination normal.    ED Results / Procedures / Treatments   Labs (all labs ordered are listed, but only abnormal results are displayed) Labs Reviewed  BASIC METABOLIC PANEL - Abnormal; Notable for the following components:      Result Value   CO2 20 (*)    All other components within normal limits  CBC - Abnormal; Notable for the following components:   RBC 3.43 (*)    Hemoglobin 10.0 (*)    HCT 32.2 (*)    RDW 16.6 (*)    All other components within normal limits  CBG MONITORING, ED - Abnormal; Notable for the following components:   Glucose-Capillary 68 (*)    All other components within normal limits  CBG MONITORING, ED - Abnormal; Notable for the following components:   Glucose-Capillary 63 (*)    All other components within normal  limits  URINALYSIS, ROUTINE W REFLEX MICROSCOPIC    EKG EKG Interpretation Date/Time:  Tuesday May 10 2023 15:50:14 EST Ventricular Rate:  95 PR Interval:  150 QRS Duration:  76 QT Interval:  352 QTC Calculation: 442 R Axis:   62  Text Interpretation: Normal sinus rhythm Normal ECG When compared with ECG of 21-Mar-2023 10:52, Since prior ECG, IVCD is improved, rate has decreased Confirmed by Dreama Longs ([id number]) on 05/10/2023 5:18:06 PM  Radiology CT Head Wo Contrast  Result Date: 05/10/2023 CLINICAL DATA:  Vertigo, central EXAM: CT HEAD WITHOUT CONTRAST TECHNIQUE: Contiguous axial images were obtained from the base of the skull through the vertex without intravenous contrast. RADIATION DOSE REDUCTION: This exam was performed according to the departmental dose-optimization program which includes automated exposure control, adjustment of the mA and/or kV according to patient size and/or use of iterative reconstruction technique. COMPARISON:  None Available. FINDINGS: Brain: No hemorrhage. No hydrocephalus. No extra-axial fluid collection. No mass effect. No mass lesion. No CT evidence acute cortical infarct. There is a background mild chronic microvascular ischemic change. Vascular: No hyperdense vessel or unexpected calcification. Skull: Normal. Negative for fracture or focal lesion. Sinuses/Orbits: No middle ear or mastoid effusion. Paranasal sinuses are notable for frothy secretions in the left sphenoid sinus. Orbits are unremarkable. Other: None. IMPRESSION: 1. No CT etiology for dizziness identified. 2. Frothy secretions in the left sphenoid sinus. Correlate for symptoms of acute sinusitis. Electronically Signed   By: Lyndall Gore M.D.   On: 05/10/2023 18:39   Medications Ordered in ED Medications  lactated ringers  bolus 500 mL (has no administration in time range)  meclizine  (ANTIVERT ) tablet 25 mg (25 mg Oral Given 05/10/23 2042)    ED Course/ Medical Decision Making/ A&P    Medical Decision Making Amount and/or Complexity of Data Reviewed Labs: ordered. Decision-making details documented in ED Course. Radiology: ordered. Decision-making details documented in ED Course. ECG/medicine tests:  Decision-making details documented in ED Course.  She presents with new onset dizziness for the past month or so, worse with head movement and darkness.  Differential for her dizziness is broad including stroke, BPPV, hypoglycemia, dehydration, cardiogenic etiologies.  Will get a CBC and BMP, CBG, EKG, CT head and MRI.  Blood glucose was 68, so she was given some food. Not currently taking any home medications for glycemic control.  EKG with normal sinus rhythm, no evidence of ischemic changes, unchanged from prior.   CBC with normocytic anemia, around baseline.  BMP unremarkable, electrolytes and kidney function normal.   CT head personally reviewed with no evidence of acute stroke, no intracranial masses, no evident etiology of her dizziness.   We gave her a trial of meclizine  for her dizziness. She was able to ambulate with assistance.   After speaking with the patient, she feels she is able to take care of herself at home and would prefer to return there instead of being admitted to the hospital or returning to a skilled nursing facility. She is requesting increased home health assistance, so we placed a TOC order for help with this. It looks like her PCP has been working on this with her as well.   We will transfer her to [facility] ED for an MRI for stroke rule out. If her MRI is negative, she can be discharged home with close outpatient follow up for further workup and management of her dizziness.   Gregary Sharper, MD 05/10/23 2114    Dreama Longs, MD 05/11/23 1428

## 2023-05-10 NOTE — ED Triage Notes (Signed)
 Pt states that for the past few months she has been extremely dizzy. Pt states it was coming and going but now it is constant since the end of November. Pt states it feels like the room is spinning. Pt denies any history of vertigo, or new medications that may have caused this.

## 2023-05-10 NOTE — ED Notes (Signed)
 Of note, pt had surgery in November for a ruptured abd ulcer and surgical incision is not completely healed. Pt with abd dressing still in place.

## 2023-05-10 NOTE — ED Notes (Signed)
Pt provided with apple juice and graham crackers.  

## 2023-05-10 NOTE — ED Notes (Signed)
 Patient complains of persistent dizziness for a few weeks. Patient states she can no longer sleep with the lights off at night due to the dizziness and things spinning. Patient has swelling in her ankles but states that's normal for her. Also has been in a few rehab facilities and has had numerous falls with injuries. Patient was able to stand, pivot and sit on the bed with little help as well as her cane.

## 2023-05-10 NOTE — Patient Instructions (Signed)
 Stay hydrated

## 2023-05-11 ENCOUNTER — Other Ambulatory Visit: Payer: Self-pay

## 2023-05-11 ENCOUNTER — Telehealth: Payer: Self-pay

## 2023-05-11 DIAGNOSIS — R42 Dizziness and giddiness: Secondary | ICD-10-CM

## 2023-05-11 MED ORDER — MECLIZINE HCL 12.5 MG PO TABS: 12.5000 mg | ORAL_TABLET | Freq: Three times a day (TID) | ORAL | 0 refills | Status: DC | PRN

## 2023-05-11 NOTE — Telephone Encounter (Signed)
 Referral for Home health PT vestibular placed.

## 2023-05-11 NOTE — Discharge Instructions (Addendum)
 Evaluation was overall reassuring. MRI was negative for stroke. Please follow up with your PCP. If you have worsening dizziness, headache, chest pain, palpitations or any other concern please return to the ED for further evaluation. Also sent meclizine  to your pharmacy for dizziness.

## 2023-05-12 ENCOUNTER — Other Ambulatory Visit: Payer: Self-pay

## 2023-05-12 DIAGNOSIS — K828 Other specified diseases of gallbladder: Secondary | ICD-10-CM | POA: Diagnosis not present

## 2023-05-12 DIAGNOSIS — I82411 Acute embolism and thrombosis of right femoral vein: Secondary | ICD-10-CM | POA: Diagnosis not present

## 2023-05-12 DIAGNOSIS — M5416 Radiculopathy, lumbar region: Secondary | ICD-10-CM | POA: Diagnosis not present

## 2023-05-12 DIAGNOSIS — K2970 Gastritis, unspecified, without bleeding: Secondary | ICD-10-CM | POA: Diagnosis not present

## 2023-05-12 DIAGNOSIS — E872 Acidosis, unspecified: Secondary | ICD-10-CM | POA: Diagnosis not present

## 2023-05-12 DIAGNOSIS — D649 Anemia, unspecified: Secondary | ICD-10-CM | POA: Diagnosis not present

## 2023-05-12 DIAGNOSIS — K760 Fatty (change of) liver, not elsewhere classified: Secondary | ICD-10-CM | POA: Diagnosis not present

## 2023-05-12 DIAGNOSIS — I119 Hypertensive heart disease without heart failure: Secondary | ICD-10-CM | POA: Diagnosis not present

## 2023-05-12 DIAGNOSIS — Z431 Encounter for attention to gastrostomy: Secondary | ICD-10-CM | POA: Diagnosis not present

## 2023-05-12 DIAGNOSIS — Z48815 Encounter for surgical aftercare following surgery on the digestive system: Secondary | ICD-10-CM | POA: Diagnosis not present

## 2023-05-12 DIAGNOSIS — R7303 Prediabetes: Secondary | ICD-10-CM | POA: Diagnosis not present

## 2023-05-12 DIAGNOSIS — N3946 Mixed incontinence: Secondary | ICD-10-CM | POA: Diagnosis not present

## 2023-05-12 DIAGNOSIS — R627 Adult failure to thrive: Secondary | ICD-10-CM | POA: Diagnosis not present

## 2023-05-12 DIAGNOSIS — M109 Gout, unspecified: Secondary | ICD-10-CM | POA: Diagnosis not present

## 2023-05-12 DIAGNOSIS — I959 Hypotension, unspecified: Secondary | ICD-10-CM | POA: Diagnosis not present

## 2023-05-12 DIAGNOSIS — E8809 Other disorders of plasma-protein metabolism, not elsewhere classified: Secondary | ICD-10-CM | POA: Diagnosis not present

## 2023-05-12 DIAGNOSIS — G4733 Obstructive sleep apnea (adult) (pediatric): Secondary | ICD-10-CM | POA: Diagnosis not present

## 2023-05-12 DIAGNOSIS — F411 Generalized anxiety disorder: Secondary | ICD-10-CM | POA: Diagnosis not present

## 2023-05-12 DIAGNOSIS — I700 Atherosclerosis of aorta: Secondary | ICD-10-CM | POA: Diagnosis not present

## 2023-05-12 DIAGNOSIS — M179 Osteoarthritis of knee, unspecified: Secondary | ICD-10-CM | POA: Diagnosis not present

## 2023-05-12 DIAGNOSIS — K289 Gastrojejunal ulcer, unspecified as acute or chronic, without hemorrhage or perforation: Secondary | ICD-10-CM | POA: Diagnosis not present

## 2023-05-12 DIAGNOSIS — K224 Dyskinesia of esophagus: Secondary | ICD-10-CM | POA: Diagnosis not present

## 2023-05-12 DIAGNOSIS — I2510 Atherosclerotic heart disease of native coronary artery without angina pectoris: Secondary | ICD-10-CM | POA: Diagnosis not present

## 2023-05-12 DIAGNOSIS — N289 Disorder of kidney and ureter, unspecified: Secondary | ICD-10-CM | POA: Diagnosis not present

## 2023-05-12 NOTE — Patient Outreach (Signed)
Care Management  Transitions of Care Program Transitions of Care Post-discharge week 3   05/12/2023 Name: Christine [name] MRN: [id number] DOB: [birth year]  Subjective: Christine [name] is a 73 y.o. year old female who is a primary care patient of [name], DO. The Care Management team Engaged with patient Engaged with patient by telephone to assess and address transitions of care needs.   Consent to Services:  Patient was given information about care management services, agreed to services, and gave verbal consent to participate.   Assessment:  The patient presented to the emergency room on 05/10/23 for c/o persistent dizziness.  All of her ED work-up with essentially negative except for a few blood sugars that were in the 60's x 2 and 70's.  During our telephone contact today, the patient reports that she was started on Meclizine for her dizziness and that it felt like it was helping already.  RNCM provided verbal education for safety in patient's with vertigo emphasis given on using extra care and caution to get up out from a laying down position or sudden changes in movements especially as these all could elicit a vertigo response and possibly cause her to lose her balance and call.  Patient stated should understood.  New home health orders were sent over to [facility] on 05/11/23 for  Home health PT vestibular treatments.  RNCM called and left a message confirming receipt of order and a possible start date.  It was challenging to keep the patient engaged today as she kept wanting to take a nap.  She asked me if I would call her insurance company to have them hurry along with the approval.  The patient appeared to be feeling well, just wanting a nap.  Only able to complete a partial medication review this telephone call due patient ready to get off the phone.  TOC will continue to follow weekly.       SDOH Interventions    Flowsheet Row Care Coordination from 05/04/2023  in [facility] Community Care Coordination Telephone from 04/28/2023 in [place] HEALTH POPULATION HEALTH DEPARTMENT Telephone from 09/17/2022 in [facility] Community Care Coordination Admission (Discharged) from 09/14/2022 in [facility] [place] [facility] Clinical Support from 01/11/2022 in [facility] Primary [facility] at [facility] Clinical Support from 01/10/2021 in [facility] Primary Care at [facility]  SDOH Interventions        Food Insecurity Interventions -- Intervention Not Indicated Intervention Not Indicated Inpatient TOC -- Intervention Not Indicated  Housing Interventions -- Intervention Not Indicated -- Inpatient TOC -- Intervention Not Indicated  Transportation Interventions -- Intervention Not Indicated Intervention Not Indicated  [drives self] Inpatient TOC -- Intervention Not Indicated  Utilities Interventions -- Intervention Not Indicated -- Inpatient TOC Intervention Not Indicated --  Depression Interventions/Treatment  Counseling -- -- -- -- --  Financial Strain Interventions -- -- -- -- -- Intervention Not Indicated  Physical Activity Interventions Other (Comments)  [plans to start in home physical therapy this week] -- -- -- -- Intervention Not Indicated  Stress Interventions Provide Counseling -- -- -- -- Intervention Not Indicated  Social Connections Interventions -- -- -- -- -- Intervention Not Indicated        Goals Addressed             This Visit's Progress    TOC Care Plan       Transition of Care Current Barriers:  Medication management  Diet/Nutrition/Food Resources  Provider appointments  Home Health services  Transportation  Knowledge Deficits related to plan of care for management of FTT (Failure to Thrive),  Gastrostomy Tube, Deep Vein Thrombosis  Lacks caregiver support- MSW services engaged to provide patient with support and community resources   RNCM Clinical Goal(s):  Patient will work with the  Care Management team over the next 30 days to address Transition of Care Barriers: Diet/Nutrition/Food Resources Provider appointments Confirm Home Health services in place - New orders signed by PCP 05/10/23 for 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 Transportation - To remain engaged with SW for SDOH needs Promote adequate nutrition to promote healthy weight and nutritional status take all medications exactly as prescribed and will call provider for medication related questions as evidenced by review of EMR and documentation of patient asking appropriate questions regarding her medications Patient will attend all scheduled medical appointments: By review of EMR as evidenced by attendance at PCP, Specialist appointments demonstrate Improved health management independence and improved quality of life by focusing on nutrition, cognition and functional therapies and intervention as evidenced by report of adequate daily oral intake of food and fluids, attain.maintain ideal body weight per her doctors and report increased functional independence. not experience hospital admission as evidenced by review of EMR in 30 days through collaboration with RN Care manager, provider, and care team.   Interventions: Evaluation of current treatment plan related to  self management and patient's adherence to plan as established by provider. Confirmed [facility] is engaged with and providing home PT, RN, and OT services.  Orders sent to [facility] 05/10/23 for HHPT for vestibular. RNCM performed a partial medication review as patient was anxious to get off the phone and started to become irritated with "so many questions".  RNCM did confirm that she has started taking her new medication prescribed 1/14, for Meclizine.  Patient confirmed she has started the medication and it seems to be working all ready.  RNCM reviewed with the patient the therapeutic function as well as side effects from the  Meclizine with emphasis given on the drowsiness side effect.  Patient verbalized understanding; "I love to sleep anyway",  I cautioned her on getting into bed when she feels drowsy and to avoid making sudden head movements or position changes to help decrease or even prevent a vertigo episodes.  Patient expressed understanding.  Transitions of Care:   Doctor Visits  - discussed the importance of doctor visits.  Upcoming appointments and lab work reviewed.  Patient understands and plans to attend. RNCM contacted [facility] to confirm receipt of new orders for [facility] physical therapy for vestibular.  Awaiting response. Assessed her eating habits as it appeared her lab work in the ED showed her blood sugar ranging in 60's and 70's and these are considered below normal.  Pt. given verbal education to be sure she is eating at least 3 healthy size meals a day or small, more frequent meals with some protein and carbohydrates to help bring her blood sugar up and maintain it at a safer level.  Patient stated she did not take her blood sugar today yet. Transportation - Patient is using Clinical biochemist who take her to her doctor appointments with at least a 24 hrs notice.  She states the service is great because they are willing to assist her in/out of building,housing and vehicle without any problems.  Patient Goals/Self-Care Activities: Participate in Transition of Care Program/Attend TOC scheduled calls Take all medications as prescribed Attend all  scheduled provider appointments Call provider office for new concerns or questions   Follow Up Plan:  Telephone follow up appointment with care management team member scheduled for:  05/19/23 at 10 AM The patient has been provided with contact information for the care management team and has been advised to call with any health related questions or concerns.          Plan: Telephone follow up appointment with care management team member scheduled  for: 05/19/23 AT 10 AM The patient has been provided with contact information for the care management team and has been advised to call with any health related questions or concerns.   Tristyn Pharris Daphine Deutscher BSN, Programmer, systems / Transitions of Care [place] [facility], [facility] Direct Dial Number:  [phone]

## 2023-05-12 NOTE — Patient Outreach (Deleted)
{  [facility] TOC PROGRAM NOTES:30402}

## 2023-05-13 DIAGNOSIS — M109 Gout, unspecified: Secondary | ICD-10-CM | POA: Diagnosis not present

## 2023-05-13 DIAGNOSIS — M5416 Radiculopathy, lumbar region: Secondary | ICD-10-CM | POA: Diagnosis not present

## 2023-05-13 DIAGNOSIS — K224 Dyskinesia of esophagus: Secondary | ICD-10-CM | POA: Diagnosis not present

## 2023-05-13 DIAGNOSIS — R627 Adult failure to thrive: Secondary | ICD-10-CM | POA: Diagnosis not present

## 2023-05-13 DIAGNOSIS — I82411 Acute embolism and thrombosis of right femoral vein: Secondary | ICD-10-CM | POA: Diagnosis not present

## 2023-05-13 DIAGNOSIS — I959 Hypotension, unspecified: Secondary | ICD-10-CM | POA: Diagnosis not present

## 2023-05-13 DIAGNOSIS — D649 Anemia, unspecified: Secondary | ICD-10-CM | POA: Diagnosis not present

## 2023-05-13 DIAGNOSIS — K828 Other specified diseases of gallbladder: Secondary | ICD-10-CM | POA: Diagnosis not present

## 2023-05-13 DIAGNOSIS — I2510 Atherosclerotic heart disease of native coronary artery without angina pectoris: Secondary | ICD-10-CM | POA: Diagnosis not present

## 2023-05-13 DIAGNOSIS — I119 Hypertensive heart disease without heart failure: Secondary | ICD-10-CM | POA: Diagnosis not present

## 2023-05-13 DIAGNOSIS — N289 Disorder of kidney and ureter, unspecified: Secondary | ICD-10-CM | POA: Diagnosis not present

## 2023-05-13 DIAGNOSIS — Z431 Encounter for attention to gastrostomy: Secondary | ICD-10-CM | POA: Diagnosis not present

## 2023-05-13 DIAGNOSIS — G4733 Obstructive sleep apnea (adult) (pediatric): Secondary | ICD-10-CM | POA: Diagnosis not present

## 2023-05-13 DIAGNOSIS — E8809 Other disorders of plasma-protein metabolism, not elsewhere classified: Secondary | ICD-10-CM | POA: Diagnosis not present

## 2023-05-13 DIAGNOSIS — K2970 Gastritis, unspecified, without bleeding: Secondary | ICD-10-CM | POA: Diagnosis not present

## 2023-05-13 DIAGNOSIS — K760 Fatty (change of) liver, not elsewhere classified: Secondary | ICD-10-CM | POA: Diagnosis not present

## 2023-05-13 DIAGNOSIS — K289 Gastrojejunal ulcer, unspecified as acute or chronic, without hemorrhage or perforation: Secondary | ICD-10-CM | POA: Diagnosis not present

## 2023-05-13 DIAGNOSIS — N3946 Mixed incontinence: Secondary | ICD-10-CM | POA: Diagnosis not present

## 2023-05-13 DIAGNOSIS — E872 Acidosis, unspecified: Secondary | ICD-10-CM | POA: Diagnosis not present

## 2023-05-13 DIAGNOSIS — Z48815 Encounter for surgical aftercare following surgery on the digestive system: Secondary | ICD-10-CM | POA: Diagnosis not present

## 2023-05-13 DIAGNOSIS — R7303 Prediabetes: Secondary | ICD-10-CM | POA: Diagnosis not present

## 2023-05-13 DIAGNOSIS — F411 Generalized anxiety disorder: Secondary | ICD-10-CM | POA: Diagnosis not present

## 2023-05-13 DIAGNOSIS — I700 Atherosclerosis of aorta: Secondary | ICD-10-CM | POA: Diagnosis not present

## 2023-05-13 DIAGNOSIS — M179 Osteoarthritis of knee, unspecified: Secondary | ICD-10-CM | POA: Diagnosis not present

## 2023-05-16 DIAGNOSIS — I959 Hypotension, unspecified: Secondary | ICD-10-CM | POA: Diagnosis not present

## 2023-05-16 DIAGNOSIS — E8809 Other disorders of plasma-protein metabolism, not elsewhere classified: Secondary | ICD-10-CM | POA: Diagnosis not present

## 2023-05-16 DIAGNOSIS — K2970 Gastritis, unspecified, without bleeding: Secondary | ICD-10-CM | POA: Diagnosis not present

## 2023-05-16 DIAGNOSIS — I2510 Atherosclerotic heart disease of native coronary artery without angina pectoris: Secondary | ICD-10-CM | POA: Diagnosis not present

## 2023-05-16 DIAGNOSIS — M5416 Radiculopathy, lumbar region: Secondary | ICD-10-CM | POA: Diagnosis not present

## 2023-05-16 DIAGNOSIS — E872 Acidosis, unspecified: Secondary | ICD-10-CM | POA: Diagnosis not present

## 2023-05-16 DIAGNOSIS — K828 Other specified diseases of gallbladder: Secondary | ICD-10-CM | POA: Diagnosis not present

## 2023-05-16 DIAGNOSIS — R7303 Prediabetes: Secondary | ICD-10-CM | POA: Diagnosis not present

## 2023-05-16 DIAGNOSIS — I119 Hypertensive heart disease without heart failure: Secondary | ICD-10-CM | POA: Diagnosis not present

## 2023-05-16 DIAGNOSIS — K224 Dyskinesia of esophagus: Secondary | ICD-10-CM | POA: Diagnosis not present

## 2023-05-16 DIAGNOSIS — K760 Fatty (change of) liver, not elsewhere classified: Secondary | ICD-10-CM | POA: Diagnosis not present

## 2023-05-16 DIAGNOSIS — M109 Gout, unspecified: Secondary | ICD-10-CM | POA: Diagnosis not present

## 2023-05-16 DIAGNOSIS — G4733 Obstructive sleep apnea (adult) (pediatric): Secondary | ICD-10-CM | POA: Diagnosis not present

## 2023-05-16 DIAGNOSIS — D649 Anemia, unspecified: Secondary | ICD-10-CM | POA: Diagnosis not present

## 2023-05-16 DIAGNOSIS — Z48815 Encounter for surgical aftercare following surgery on the digestive system: Secondary | ICD-10-CM | POA: Diagnosis not present

## 2023-05-16 DIAGNOSIS — I700 Atherosclerosis of aorta: Secondary | ICD-10-CM | POA: Diagnosis not present

## 2023-05-16 DIAGNOSIS — Z431 Encounter for attention to gastrostomy: Secondary | ICD-10-CM | POA: Diagnosis not present

## 2023-05-16 DIAGNOSIS — F411 Generalized anxiety disorder: Secondary | ICD-10-CM | POA: Diagnosis not present

## 2023-05-16 DIAGNOSIS — K289 Gastrojejunal ulcer, unspecified as acute or chronic, without hemorrhage or perforation: Secondary | ICD-10-CM | POA: Diagnosis not present

## 2023-05-16 DIAGNOSIS — N3946 Mixed incontinence: Secondary | ICD-10-CM | POA: Diagnosis not present

## 2023-05-16 DIAGNOSIS — N289 Disorder of kidney and ureter, unspecified: Secondary | ICD-10-CM | POA: Diagnosis not present

## 2023-05-16 DIAGNOSIS — M179 Osteoarthritis of knee, unspecified: Secondary | ICD-10-CM | POA: Diagnosis not present

## 2023-05-16 DIAGNOSIS — R627 Adult failure to thrive: Secondary | ICD-10-CM | POA: Diagnosis not present

## 2023-05-16 DIAGNOSIS — I82411 Acute embolism and thrombosis of right femoral vein: Secondary | ICD-10-CM | POA: Diagnosis not present

## 2023-05-17 ENCOUNTER — Encounter: Payer: Self-pay | Admitting: Family Medicine

## 2023-05-17 ENCOUNTER — Telehealth (INDEPENDENT_AMBULATORY_CARE_PROVIDER_SITE_OTHER): Payer: Medicare HMO | Admitting: Family Medicine

## 2023-05-17 ENCOUNTER — Telehealth: Payer: Self-pay

## 2023-05-17 DIAGNOSIS — R42 Dizziness and giddiness: Secondary | ICD-10-CM

## 2023-05-17 DIAGNOSIS — Z431 Encounter for attention to gastrostomy: Secondary | ICD-10-CM | POA: Diagnosis not present

## 2023-05-17 DIAGNOSIS — N289 Disorder of kidney and ureter, unspecified: Secondary | ICD-10-CM | POA: Diagnosis not present

## 2023-05-17 DIAGNOSIS — E8809 Other disorders of plasma-protein metabolism, not elsewhere classified: Secondary | ICD-10-CM | POA: Diagnosis not present

## 2023-05-17 DIAGNOSIS — I2510 Atherosclerotic heart disease of native coronary artery without angina pectoris: Secondary | ICD-10-CM | POA: Diagnosis not present

## 2023-05-17 DIAGNOSIS — K224 Dyskinesia of esophagus: Secondary | ICD-10-CM | POA: Diagnosis not present

## 2023-05-17 DIAGNOSIS — Z48815 Encounter for surgical aftercare following surgery on the digestive system: Secondary | ICD-10-CM | POA: Diagnosis not present

## 2023-05-17 DIAGNOSIS — F411 Generalized anxiety disorder: Secondary | ICD-10-CM | POA: Diagnosis not present

## 2023-05-17 DIAGNOSIS — I700 Atherosclerosis of aorta: Secondary | ICD-10-CM | POA: Diagnosis not present

## 2023-05-17 DIAGNOSIS — M179 Osteoarthritis of knee, unspecified: Secondary | ICD-10-CM | POA: Diagnosis not present

## 2023-05-17 DIAGNOSIS — K760 Fatty (change of) liver, not elsewhere classified: Secondary | ICD-10-CM | POA: Diagnosis not present

## 2023-05-17 DIAGNOSIS — R7303 Prediabetes: Secondary | ICD-10-CM | POA: Diagnosis not present

## 2023-05-17 DIAGNOSIS — M5416 Radiculopathy, lumbar region: Secondary | ICD-10-CM | POA: Diagnosis not present

## 2023-05-17 DIAGNOSIS — K2970 Gastritis, unspecified, without bleeding: Secondary | ICD-10-CM | POA: Diagnosis not present

## 2023-05-17 DIAGNOSIS — I959 Hypotension, unspecified: Secondary | ICD-10-CM | POA: Diagnosis not present

## 2023-05-17 DIAGNOSIS — E872 Acidosis, unspecified: Secondary | ICD-10-CM | POA: Diagnosis not present

## 2023-05-17 DIAGNOSIS — N3946 Mixed incontinence: Secondary | ICD-10-CM | POA: Diagnosis not present

## 2023-05-17 DIAGNOSIS — I82411 Acute embolism and thrombosis of right femoral vein: Secondary | ICD-10-CM | POA: Diagnosis not present

## 2023-05-17 DIAGNOSIS — I119 Hypertensive heart disease without heart failure: Secondary | ICD-10-CM | POA: Diagnosis not present

## 2023-05-17 DIAGNOSIS — K289 Gastrojejunal ulcer, unspecified as acute or chronic, without hemorrhage or perforation: Secondary | ICD-10-CM | POA: Diagnosis not present

## 2023-05-17 DIAGNOSIS — M109 Gout, unspecified: Secondary | ICD-10-CM | POA: Diagnosis not present

## 2023-05-17 DIAGNOSIS — D649 Anemia, unspecified: Secondary | ICD-10-CM | POA: Diagnosis not present

## 2023-05-17 DIAGNOSIS — K828 Other specified diseases of gallbladder: Secondary | ICD-10-CM | POA: Diagnosis not present

## 2023-05-17 DIAGNOSIS — R627 Adult failure to thrive: Secondary | ICD-10-CM | POA: Diagnosis not present

## 2023-05-17 DIAGNOSIS — G4733 Obstructive sleep apnea (adult) (pediatric): Secondary | ICD-10-CM | POA: Diagnosis not present

## 2023-05-17 MED ORDER — MECLIZINE HCL 12.5 MG PO TABS: 12.5000 mg | ORAL_TABLET | Freq: Three times a day (TID) | ORAL | 0 refills | Status: DC | PRN

## 2023-05-17 NOTE — Telephone Encounter (Signed)
Called Home Health verbal order was given. Caller/Agency: [facility] Callback Number: [phone] Service Requested: Occupational Therapy

## 2023-05-17 NOTE — Telephone Encounter (Signed)
OK 

## 2023-05-17 NOTE — Progress Notes (Signed)
Chief Complaint  Patient presents with   Follow-up    Follow up    Subjective: Patient is a 73 y.o. female here for ER f/u. We are interacting via web portal for an electronic face-to-face visit. I verified patient's ID using 2 identifiers. Patient agreed to proceed with visit via this method. Patient is at home, I am at office. Patient and I are present for visit.   I saw her earlier in the mo for sustained dizziness. Sent to ED to r/o cerebellar stroke, MRI brain was reassuring. Dizziness is 90% improved. Waxes and wanes in severity. Meclizine does help, no AE's. PT is visiting, next visit is in 2 d.   Past Medical History:  Diagnosis Date   Allergy    dust, pollen, sulfa, prednisone   Anemia    Anxiety    Arthritis    "knees" (04/26/2016)   Colon polyps    benign per pt   Coronary artery disease    mild per 2015 cath in [place] (OM1 30%, RCA 30%)   GERD (gastroesophageal reflux disease)    Gout    History of hiatal hernia    Hyperlipidemia 05/20/2021   Hypertension    Migraine    "none since early /2017" (05/06/2016)   Obesity    OSA on CPAP    uses CPAP   Pre-diabetes    Pre-operative cardiovascular examination 08/22/2008   Renal insufficiency 11/09/2022   Vasculitis (HCC)    Bilateral    Objective: No conversational dyspnea Age appropriate judgment and insight Nml affect and mood  Assessment and Plan: Vertigo - Plan: meclizine (ANTIVERT) 12.5 MG tablet  Stay hydrated. Cont meclizine prn. Will have visiting PT do some vestibular maneuvers.  F/u as originally scheduled.  The patient voiced understanding and agreement to the plan.  Jilda Roche [place], DO 05/17/23  1:27 PM

## 2023-05-17 NOTE — Telephone Encounter (Signed)
Copied from CRM [id number]. Topic: Clinical - Home Health Verbal Orders >> May 17, 2023 10:48 AM Orson Gear wrote: Caller/Agency: [facility] Callback Number: [phone] Service Requested: Occupational Therapy Frequency: once a week for 6 weeks Any new concerns about the patient? No

## 2023-05-18 ENCOUNTER — Ambulatory Visit: Payer: Self-pay | Admitting: Licensed Clinical Social Worker

## 2023-05-18 NOTE — Patient Outreach (Signed)
  Care Coordination   Follow Up Visit Note   05/18/2023 Name: Christine [name] MRN: [id number] DOB: 01-12-1951  Christine [name] is a 73 y.o. year old female who sees Carmelia Roller, Jilda Roche, DO for primary care. I spoke with  Christine [name] by phone today.  What matters to the patients health and wellness today? Patient Stated she has decreased family support (family resides in [place]). She has challenges in completing ADLs     Goals Addressed             This Visit's Progress    Patient Stated she has decreased family support (family resides in [place]). She has challenges in completing ADLs       Interventions:  Spoke with client via phone today about client needs and status. Discussed program support  with RN , LCSW, Pharmacist Discussed medication procurement of client. Client has prescribed medications and is taking medications as prescribed Discussed walking of client. She has a cane and a walker to use as needed Discussed sleeping issues. Client has bed in dining room area. This allows client to be close to bathroom and to be close to her kitchen Discussed family support. Client family resides in [place] Used Active Listening techniques to hear client needs Client had said previously that nurse visited her in the home 2 times per week to assess nursing needs of client Discussed next scheduled client call with LCSW on June 01, 2023. Thanked client for phone call with LCSW today Encouraged client to call LCSW as needed for SW support at [phone]          SDOH assessments and interventions completed:  Yes  SDOH Interventions Today    Flowsheet Row Most Recent Value  SDOH Interventions   Depression Interventions/Treatment  Counseling  Physical Activity Interventions Other (Comments)  [mobility challenges]  Stress Interventions Provide Counseling  [has stress in managing medical needs]        Care Coordination Interventions:  Yes,  provided   Interventions Today    Flowsheet Row Most Recent Value  Chronic Disease   Chronic disease during today's visit Other  [spoke with client about client needs]  General Interventions   General Interventions Discussed/Reviewed General Interventions Discussed, Community Resources  Education Interventions   Education Provided Provided Education  Provided Engineer, [employer] On [employer]  Mental Health Interventions   Mental Health Discussed/Reviewed Coping Strategies  [client is trying to manage ADLs. Client is trying to manage medical needs]  Pharmacy Interventions   Pharmacy Dicussed/Reviewed Pharmacy Topics Discussed        Follow up plan: LCSW to call client as scheduled on June 01, 2023 to assess client needs at that time   Encounter Outcome:  Patient Visit Completed    Lorna Few  MSW, LCSW [place] Licensed Clinical Social Worker Direct Dial:  [phone] Fax:  [phone] Website:  [email or web address]

## 2023-05-18 NOTE — Patient Instructions (Signed)
Visit Information  Thank you for taking time to visit with me today. Please don't hesitate to contact me if I can be of assistance to you.   Following are the goals we discussed today:   Goals Addressed             This Visit's Progress    Patient Stated she has decreased family support (family resides in [place]). She has challenges in completing ADLs       Interventions:  Spoke with client via phone today about client needs and status. Discussed program support  with RN , LCSW, Pharmacist Discussed medication procurement of client. Client has prescribed medications and is taking medications as prescribed Discussed walking of client. She has a cane and a walker to use as needed Discussed sleeping issues. Client has bed in dining room area. This allows client to be close to bathroom and to be close to her kitchen Discussed family support. Client family resides in [place] Used Active Listening techniques to hear client needs Client had said previously that nurse visited her in the home 2 times per week to assess nursing needs of client Discussed next scheduled client call with LCSW on June 01, 2023. Thanked client for phone call with LCSW today Encouraged client to call LCSW as needed for SW support at [phone]         LCSW to call client as scheduled on Februry 5, 2025 to assess client needs at that time   Please call the care guide team at [phone] if you need to cancel or reschedule your appointment.   If you are experiencing a Mental Health or Behavioral Health Crisis or need someone to talk to, please go to [facility] [address] [place] [phone])   The patient verbalized understanding of instructions, educational materials, and care plan provided today and DECLINED offer to receive copy of patient instructions, educational materials, and care plan.   The patient has been provided with contact information for  the care management team and has been advised to call with any health related questions or concerns.    Lorna Few  MSW, LCSW [place] Licensed Clinical Social Worker Direct Dial:  [phone] Fax:  [phone] Website:  [email or web address]

## 2023-05-19 ENCOUNTER — Other Ambulatory Visit: Payer: Self-pay

## 2023-05-19 ENCOUNTER — Telehealth: Payer: Self-pay

## 2023-05-19 ENCOUNTER — Other Ambulatory Visit: Payer: Medicare HMO

## 2023-05-19 NOTE — Patient Outreach (Signed)
Care Management  Transitions of Care Program Transitions of Care Post-discharge week 4   05/19/2023 Name: Christine [name] MRN: [id number] DOB: [birth year]  Subjective: Christine [name] is a 73 y.o. year old female who is a primary care patient of [name], DO. The Care Management team Engaged with patient Engaged with patient by telephone to assess and address transitions of care needs.   Consent to Services:  Patient was given information about care management services, agreed to services, and gave verbal consent to participate.   Assessment:           SDOH Interventions    Flowsheet Row Care Coordination from 05/18/2023 in [facility] Community Care Coordination Care Coordination from 05/04/2023 in [facility] Community Care Coordination Telephone from 04/28/2023 in [place] HEALTH POPULATION HEALTH DEPARTMENT Telephone from 09/17/2022 in [facility] Community Care Coordination Admission (Discharged) from 09/14/2022 in [facility] [place] General Surgery Clinical Support from 01/11/2022 in [facility]  SDOH Interventions        Food Insecurity Interventions -- -- Intervention Not Indicated Intervention Not Indicated Inpatient TOC --  Housing Interventions -- -- Intervention Not Indicated -- Inpatient TOC --  Transportation Interventions -- -- Intervention Not Indicated Intervention Not Indicated  [drives self] Inpatient TOC --  Utilities Interventions -- -- Intervention Not Indicated -- Inpatient TOC Intervention Not Indicated  Depression Interventions/Treatment  Counseling Counseling -- -- -- --  Physical Activity Interventions Other (Comments)  [mobility challenges] Other (Comments)  [plans to start in home physical therapy this week] -- -- -- --  Stress Interventions Provide Counseling  [has stress in managing medical needs] Provide Counseling -- -- -- --        Goals Addressed              This Visit's Progress    TOC Care Plan       Transition of Care Current Barriers:  Medication management  Diet/Nutrition/Food Resources  Provider appointments  Home Health services  Transportation  Knowledge Deficits related to plan of care 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 Transportation - To remain engaged with SW for SDOH needs Promote adequate nutrition to promote healthy weight and nutritional status take all medications exactly as prescribed and will call provider for medication related questions as evidenced by review of EMR and documentation of patient asking appropriate questions regarding her medications Patient will attend all scheduled medical appointments: By review of EMR as evidenced by attendance at PCP, Specialist appointments demonstrate Improved health management independence and improved quality of life by focusing on nutrition, cognition and functional therapies and intervention as evidenced by report of adequate daily oral intake of food and fluids, attain.maintain ideal body weight per her doctors and report increased functional independence. not experience hospital admission as evidenced by review of EMR in 30 days through collaboration with RN Care manager, provider, and care team.   Interventions: Evaluation of current treatment plan related to  self management and patient's adherence to plan as established by provider. Confirmed [facility] is engaged with and providing home PT, RN,  and OT services.  Orders sent to [facility] 05/10/23 for HHPT for vestibular. RNCM performed a partial medication review as patient was anxious to get off the phone and started to become irritated with "so many questions".  RNCM did confirm that she has started taking her new medication prescribed 1/14, for Meclizine.  Patient confirmed she has started the medication and it seems to be working all ready.  RNCM reviewed with the patient the therapeutic function as well as side effects from the Meclizine with emphasis given on the drowsiness side effect.  Patient verbalized understanding; "I love to sleep anyway",  I cautioned her on getting into bed when she feels drowsy and to avoid making sudden head movements or position changes to help decrease or even prevent a vertigo episodes.  Patient expressed understanding.  Transitions of Care:   Doctor Visits  - discussed the importance of doctor visits.  Upcoming appointments and lab work reviewed.  Patient understands and plans to attend. RNCM contacted [facility] to confirm receipt of new orders for [facility] physical therapy for vestibular.  Awaiting response. Assessed her eating habits as it appeared her lab work in the ED showed her blood sugar ranging in 60's and 70's and these are considered below normal.  Pt. given verbal education to be sure she is eating at least 3 healthy size meals a day or small, more frequent meals with some protein and carbohydrates to help bring her blood sugar up and maintain it at a safer level.  Patient stated she did not take her blood sugar today yet. Transportation - Patient is using Clinical biochemist who take her to her doctor appointments with at least a 24 hrs notice.  She states the service is great because they are willing to assist her in/out of building,housing and vehicle without any problems.  Patient Goals/Self-Care Activities: Participate in Transition of Care Program/Attend TOC scheduled calls Take all  medications as prescribed Attend all scheduled provider appointments Call provider office for new concerns or questions   Follow Up Plan:  Telephone follow up appointment with care management team member scheduled for:  05/20/23 at 3pm to allow time to follow up with patient's Home Health Services provider re availability of Home Health Aide ordered upon discharge from hospital 04/22/23. The patient has been provided with contact information for the care management team and has been advised to call with any health related questions or concerns.          Plan: The patient has been provided with contact information for the care management team and has been advised to call with any health related questions or concerns.   Alyse Low, RN, BA, [facility], CRRN [facility] Coordinator, Transition of Care Ph # [phone]

## 2023-05-19 NOTE — Patient Outreach (Signed)
  Care Management  Transitions of Care Program Transitions of Care Post-discharge week 3 05/19/2023 Name: Christine [name] MRN: [id number] DOB: March 03, 1951  Subjective: Christine [name] is a 73 y.o. year old female who is a primary care patient of [name], DO. The Care Management team was unable to reach the patient by phone to assess and address transitions of care needs.   Plan: Additional outreach attempts will be made to reach the patient enrolled in the [facility] Program (Post Inpatient/ED Visit).  Alyse Low, RN, BA, [facility], CRRN [facility] Coordinator, Transition of Care Ph # [phone]

## 2023-05-19 NOTE — Patient Instructions (Signed)
Visit Information  Thank you for taking time to visit with me today. Please don't hesitate to contact me if I can be of assistance to you before our next scheduled telephone appointment.  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 Transportation - To remain engaged with SW for SDOH needs Promote adequate nutrition to promote healthy weight and nutritional status take all medications exactly as prescribed and will call provider for medication related questions as evidenced by review of EMR and documentation of patient asking appropriate questions regarding her medications Patient will attend all scheduled medical appointments: By review of EMR as evidenced by attendance at PCP, Specialist appointments demonstrate Improved health management independence and improved quality of life by focusing on nutrition, cognition and functional therapies and intervention as evidenced by report of adequate daily oral intake of food and fluids, attain.maintain ideal body weight per her doctors and report increased functional  independence. not experience hospital admission as evidenced by review of EMR in 30 days through collaboration with RN Care manager, provider, and care team.   Interventions: Evaluation of current treatment plan related to  self management and patient's adherence to plan as established by provider. Confirmed [facility] is engaged with and providing home PT, RN, and OT services.  Orders sent to [facility] 05/10/23 for HHPT for vestibular. RNCM performed a partial medication review as patient was anxious to get off the phone and started to become irritated with "so many questions".  RNCM did confirm that she has started taking her new medication prescribed 1/14, for Meclizine.  Patient confirmed she has started the medication and it seems to be working all ready.  RNCM reviewed with the patient the therapeutic function as well as side effects from the Meclizine with emphasis given on the drowsiness side effect.  Patient verbalized understanding; "I love to sleep anyway",  I cautioned her on getting into bed when she feels drowsy and to avoid making sudden head movements or position changes to help decrease or even prevent a vertigo episodes.  Patient expressed understanding.  Transitions of Care:   Doctor Visits  - discussed the importance of doctor visits.  Upcoming appointments and lab work reviewed.  Patient understands and plans to attend. RNCM contacted [facility] to confirm receipt of new orders for [facility] physical therapy for vestibular.  Awaiting response. Assessed her eating habits as it appeared her lab work in the ED showed her blood sugar ranging in 60's and 70's and these are considered below normal.  Pt. given verbal education to be sure she is eating  at least 3 healthy size meals a day or small, more frequent meals with some protein and carbohydrates to help bring her blood sugar up and maintain it at a safer level.  Patient stated she did not take her blood sugar today  yet. Transportation - Patient is using Clinical biochemist who take her to her doctor appointments with at least a 24 hrs notice.  She states the service is great because they are willing to assist her in/out of building,housing and vehicle without any problems.  Patient Goals/Self-Care Activities: Participate in Transition of Care Program/Attend TOC scheduled calls Take all medications as prescribed Attend all scheduled provider appointments Call provider office for new concerns or questions   Follow Up Plan:  Telephone follow up appointment with care management team member scheduled for:  05/20/23 at 3pm to allow time to follow up with patient's Home Health Services provider re availability of Home Health Aide ordered upon discharge from hospital 04/22/23. The patient has been provided with contact information for the care management team and has been advised to call with any health related questions or concerns.          Patient verbalizes understanding of instructions and care plan provided today and agrees to view in MyChart. Active MyChart status and patient understanding of how to access instructions and care plan via MyChart confirmed with patient.     The patient has been provided with contact information for the care management team and has been advised to call with any health related questions or concerns.   Please call the care guide team at [phone] if you need to cancel or reschedule your appointment.   Please call 1-800-273-TALK (toll free, 24 hour hotline) if you are experiencing a Mental Health or Behavioral Health Crisis or need someone to talk to.  Alyse Low, RN, BA, [facility], CRRN [facility] Coordinator, Transition of Care Ph # [phone]

## 2023-05-20 ENCOUNTER — Other Ambulatory Visit: Payer: Self-pay

## 2023-05-20 ENCOUNTER — Telehealth: Payer: Self-pay

## 2023-05-20 DIAGNOSIS — E8809 Other disorders of plasma-protein metabolism, not elsewhere classified: Secondary | ICD-10-CM | POA: Diagnosis not present

## 2023-05-20 DIAGNOSIS — I959 Hypotension, unspecified: Secondary | ICD-10-CM | POA: Diagnosis not present

## 2023-05-20 DIAGNOSIS — M109 Gout, unspecified: Secondary | ICD-10-CM | POA: Diagnosis not present

## 2023-05-20 DIAGNOSIS — Z431 Encounter for attention to gastrostomy: Secondary | ICD-10-CM | POA: Diagnosis not present

## 2023-05-20 DIAGNOSIS — I700 Atherosclerosis of aorta: Secondary | ICD-10-CM | POA: Diagnosis not present

## 2023-05-20 DIAGNOSIS — I119 Hypertensive heart disease without heart failure: Secondary | ICD-10-CM | POA: Diagnosis not present

## 2023-05-20 DIAGNOSIS — N289 Disorder of kidney and ureter, unspecified: Secondary | ICD-10-CM | POA: Diagnosis not present

## 2023-05-20 DIAGNOSIS — K760 Fatty (change of) liver, not elsewhere classified: Secondary | ICD-10-CM | POA: Diagnosis not present

## 2023-05-20 DIAGNOSIS — M179 Osteoarthritis of knee, unspecified: Secondary | ICD-10-CM | POA: Diagnosis not present

## 2023-05-20 DIAGNOSIS — D649 Anemia, unspecified: Secondary | ICD-10-CM | POA: Diagnosis not present

## 2023-05-20 DIAGNOSIS — I2510 Atherosclerotic heart disease of native coronary artery without angina pectoris: Secondary | ICD-10-CM | POA: Diagnosis not present

## 2023-05-20 DIAGNOSIS — F411 Generalized anxiety disorder: Secondary | ICD-10-CM | POA: Diagnosis not present

## 2023-05-20 DIAGNOSIS — G4733 Obstructive sleep apnea (adult) (pediatric): Secondary | ICD-10-CM | POA: Diagnosis not present

## 2023-05-20 DIAGNOSIS — Z48815 Encounter for surgical aftercare following surgery on the digestive system: Secondary | ICD-10-CM | POA: Diagnosis not present

## 2023-05-20 DIAGNOSIS — R627 Adult failure to thrive: Secondary | ICD-10-CM | POA: Diagnosis not present

## 2023-05-20 DIAGNOSIS — N3946 Mixed incontinence: Secondary | ICD-10-CM | POA: Diagnosis not present

## 2023-05-20 DIAGNOSIS — I82411 Acute embolism and thrombosis of right femoral vein: Secondary | ICD-10-CM | POA: Diagnosis not present

## 2023-05-20 DIAGNOSIS — M5416 Radiculopathy, lumbar region: Secondary | ICD-10-CM | POA: Diagnosis not present

## 2023-05-20 DIAGNOSIS — E872 Acidosis, unspecified: Secondary | ICD-10-CM | POA: Diagnosis not present

## 2023-05-20 DIAGNOSIS — K289 Gastrojejunal ulcer, unspecified as acute or chronic, without hemorrhage or perforation: Secondary | ICD-10-CM | POA: Diagnosis not present

## 2023-05-20 DIAGNOSIS — K224 Dyskinesia of esophagus: Secondary | ICD-10-CM | POA: Diagnosis not present

## 2023-05-20 DIAGNOSIS — K2970 Gastritis, unspecified, without bleeding: Secondary | ICD-10-CM | POA: Diagnosis not present

## 2023-05-20 DIAGNOSIS — R7303 Prediabetes: Secondary | ICD-10-CM | POA: Diagnosis not present

## 2023-05-20 DIAGNOSIS — K828 Other specified diseases of gallbladder: Secondary | ICD-10-CM | POA: Diagnosis not present

## 2023-05-20 NOTE — Patient Outreach (Signed)
  Care Management  Transitions of Care Program Transitions of Care Post-discharge week 4  05/20/2023 Name: Christine [name] MRN: [id number] DOB: 09-26-1950  Subjective: Christine [name] is a 73 y.o. year old female who is a primary care patient of [name], DO. The Care Management team was unable to reach the patient by phone to assess and address transitions of care needs.   Plan: Additional outreach attempts will be made to reach the patient enrolled in the [facility] Program (Post Inpatient/ED Visit).  As agreed during yesterday's call with patient, left message regarding 2 unsuccessful attempts over last 2 days to reach patient's Home Health Agency. Called and left message with [facility] on Provider/Referral line. Will try to contact this Home Health Agency again on Monday, 05/23/23 and call patient with discussion results.  Alyse Low, RN, BA, [facility], CRRN [facility] Coordinator, Transition of Care Ph # [phone]

## 2023-05-23 DIAGNOSIS — R7303 Prediabetes: Secondary | ICD-10-CM | POA: Diagnosis not present

## 2023-05-23 DIAGNOSIS — K760 Fatty (change of) liver, not elsewhere classified: Secondary | ICD-10-CM | POA: Diagnosis not present

## 2023-05-23 DIAGNOSIS — I700 Atherosclerosis of aorta: Secondary | ICD-10-CM | POA: Diagnosis not present

## 2023-05-23 DIAGNOSIS — D649 Anemia, unspecified: Secondary | ICD-10-CM | POA: Diagnosis not present

## 2023-05-23 DIAGNOSIS — K2970 Gastritis, unspecified, without bleeding: Secondary | ICD-10-CM | POA: Diagnosis not present

## 2023-05-23 DIAGNOSIS — I119 Hypertensive heart disease without heart failure: Secondary | ICD-10-CM | POA: Diagnosis not present

## 2023-05-23 DIAGNOSIS — M179 Osteoarthritis of knee, unspecified: Secondary | ICD-10-CM | POA: Diagnosis not present

## 2023-05-23 DIAGNOSIS — N3946 Mixed incontinence: Secondary | ICD-10-CM | POA: Diagnosis not present

## 2023-05-23 DIAGNOSIS — I959 Hypotension, unspecified: Secondary | ICD-10-CM | POA: Diagnosis not present

## 2023-05-23 DIAGNOSIS — N289 Disorder of kidney and ureter, unspecified: Secondary | ICD-10-CM | POA: Diagnosis not present

## 2023-05-23 DIAGNOSIS — F411 Generalized anxiety disorder: Secondary | ICD-10-CM | POA: Diagnosis not present

## 2023-05-23 DIAGNOSIS — M5416 Radiculopathy, lumbar region: Secondary | ICD-10-CM | POA: Diagnosis not present

## 2023-05-23 DIAGNOSIS — E872 Acidosis, unspecified: Secondary | ICD-10-CM | POA: Diagnosis not present

## 2023-05-23 DIAGNOSIS — R627 Adult failure to thrive: Secondary | ICD-10-CM | POA: Diagnosis not present

## 2023-05-23 DIAGNOSIS — Z431 Encounter for attention to gastrostomy: Secondary | ICD-10-CM | POA: Diagnosis not present

## 2023-05-23 DIAGNOSIS — K289 Gastrojejunal ulcer, unspecified as acute or chronic, without hemorrhage or perforation: Secondary | ICD-10-CM | POA: Diagnosis not present

## 2023-05-23 DIAGNOSIS — K224 Dyskinesia of esophagus: Secondary | ICD-10-CM | POA: Diagnosis not present

## 2023-05-23 DIAGNOSIS — M109 Gout, unspecified: Secondary | ICD-10-CM | POA: Diagnosis not present

## 2023-05-23 DIAGNOSIS — G4733 Obstructive sleep apnea (adult) (pediatric): Secondary | ICD-10-CM | POA: Diagnosis not present

## 2023-05-23 DIAGNOSIS — K828 Other specified diseases of gallbladder: Secondary | ICD-10-CM | POA: Diagnosis not present

## 2023-05-23 DIAGNOSIS — Z48815 Encounter for surgical aftercare following surgery on the digestive system: Secondary | ICD-10-CM | POA: Diagnosis not present

## 2023-05-23 DIAGNOSIS — I2510 Atherosclerotic heart disease of native coronary artery without angina pectoris: Secondary | ICD-10-CM | POA: Diagnosis not present

## 2023-05-23 DIAGNOSIS — I82411 Acute embolism and thrombosis of right femoral vein: Secondary | ICD-10-CM | POA: Diagnosis not present

## 2023-05-23 DIAGNOSIS — E8809 Other disorders of plasma-protein metabolism, not elsewhere classified: Secondary | ICD-10-CM | POA: Diagnosis not present

## 2023-05-25 DIAGNOSIS — N289 Disorder of kidney and ureter, unspecified: Secondary | ICD-10-CM | POA: Diagnosis not present

## 2023-05-25 DIAGNOSIS — K828 Other specified diseases of gallbladder: Secondary | ICD-10-CM | POA: Diagnosis not present

## 2023-05-25 DIAGNOSIS — K289 Gastrojejunal ulcer, unspecified as acute or chronic, without hemorrhage or perforation: Secondary | ICD-10-CM | POA: Diagnosis not present

## 2023-05-25 DIAGNOSIS — M179 Osteoarthritis of knee, unspecified: Secondary | ICD-10-CM | POA: Diagnosis not present

## 2023-05-25 DIAGNOSIS — F411 Generalized anxiety disorder: Secondary | ICD-10-CM | POA: Diagnosis not present

## 2023-05-25 DIAGNOSIS — K224 Dyskinesia of esophagus: Secondary | ICD-10-CM | POA: Diagnosis not present

## 2023-05-25 DIAGNOSIS — Z431 Encounter for attention to gastrostomy: Secondary | ICD-10-CM | POA: Diagnosis not present

## 2023-05-25 DIAGNOSIS — K2970 Gastritis, unspecified, without bleeding: Secondary | ICD-10-CM | POA: Diagnosis not present

## 2023-05-25 DIAGNOSIS — R627 Adult failure to thrive: Secondary | ICD-10-CM | POA: Diagnosis not present

## 2023-05-25 DIAGNOSIS — N3946 Mixed incontinence: Secondary | ICD-10-CM | POA: Diagnosis not present

## 2023-05-25 DIAGNOSIS — M5416 Radiculopathy, lumbar region: Secondary | ICD-10-CM | POA: Diagnosis not present

## 2023-05-25 DIAGNOSIS — R7303 Prediabetes: Secondary | ICD-10-CM | POA: Diagnosis not present

## 2023-05-25 DIAGNOSIS — I959 Hypotension, unspecified: Secondary | ICD-10-CM | POA: Diagnosis not present

## 2023-05-25 DIAGNOSIS — K760 Fatty (change of) liver, not elsewhere classified: Secondary | ICD-10-CM | POA: Diagnosis not present

## 2023-05-25 DIAGNOSIS — D649 Anemia, unspecified: Secondary | ICD-10-CM | POA: Diagnosis not present

## 2023-05-25 DIAGNOSIS — G4733 Obstructive sleep apnea (adult) (pediatric): Secondary | ICD-10-CM | POA: Diagnosis not present

## 2023-05-25 DIAGNOSIS — I2510 Atherosclerotic heart disease of native coronary artery without angina pectoris: Secondary | ICD-10-CM | POA: Diagnosis not present

## 2023-05-25 DIAGNOSIS — I119 Hypertensive heart disease without heart failure: Secondary | ICD-10-CM | POA: Diagnosis not present

## 2023-05-25 DIAGNOSIS — I82411 Acute embolism and thrombosis of right femoral vein: Secondary | ICD-10-CM | POA: Diagnosis not present

## 2023-05-25 DIAGNOSIS — E872 Acidosis, unspecified: Secondary | ICD-10-CM | POA: Diagnosis not present

## 2023-05-25 DIAGNOSIS — M109 Gout, unspecified: Secondary | ICD-10-CM | POA: Diagnosis not present

## 2023-05-25 DIAGNOSIS — Z48815 Encounter for surgical aftercare following surgery on the digestive system: Secondary | ICD-10-CM | POA: Diagnosis not present

## 2023-05-25 DIAGNOSIS — E8809 Other disorders of plasma-protein metabolism, not elsewhere classified: Secondary | ICD-10-CM | POA: Diagnosis not present

## 2023-05-25 DIAGNOSIS — I700 Atherosclerosis of aorta: Secondary | ICD-10-CM | POA: Diagnosis not present

## 2023-05-31 DIAGNOSIS — N3946 Mixed incontinence: Secondary | ICD-10-CM | POA: Diagnosis not present

## 2023-05-31 DIAGNOSIS — K2970 Gastritis, unspecified, without bleeding: Secondary | ICD-10-CM | POA: Diagnosis not present

## 2023-05-31 DIAGNOSIS — R627 Adult failure to thrive: Secondary | ICD-10-CM | POA: Diagnosis not present

## 2023-05-31 DIAGNOSIS — Z48815 Encounter for surgical aftercare following surgery on the digestive system: Secondary | ICD-10-CM | POA: Diagnosis not present

## 2023-05-31 DIAGNOSIS — M109 Gout, unspecified: Secondary | ICD-10-CM | POA: Diagnosis not present

## 2023-05-31 DIAGNOSIS — E8809 Other disorders of plasma-protein metabolism, not elsewhere classified: Secondary | ICD-10-CM | POA: Diagnosis not present

## 2023-05-31 DIAGNOSIS — Z431 Encounter for attention to gastrostomy: Secondary | ICD-10-CM | POA: Diagnosis not present

## 2023-05-31 DIAGNOSIS — M5416 Radiculopathy, lumbar region: Secondary | ICD-10-CM | POA: Diagnosis not present

## 2023-05-31 DIAGNOSIS — M179 Osteoarthritis of knee, unspecified: Secondary | ICD-10-CM | POA: Diagnosis not present

## 2023-05-31 DIAGNOSIS — I700 Atherosclerosis of aorta: Secondary | ICD-10-CM | POA: Diagnosis not present

## 2023-05-31 DIAGNOSIS — K224 Dyskinesia of esophagus: Secondary | ICD-10-CM | POA: Diagnosis not present

## 2023-05-31 DIAGNOSIS — E872 Acidosis, unspecified: Secondary | ICD-10-CM | POA: Diagnosis not present

## 2023-05-31 DIAGNOSIS — K289 Gastrojejunal ulcer, unspecified as acute or chronic, without hemorrhage or perforation: Secondary | ICD-10-CM | POA: Diagnosis not present

## 2023-05-31 DIAGNOSIS — D649 Anemia, unspecified: Secondary | ICD-10-CM | POA: Diagnosis not present

## 2023-05-31 DIAGNOSIS — I82411 Acute embolism and thrombosis of right femoral vein: Secondary | ICD-10-CM | POA: Diagnosis not present

## 2023-05-31 DIAGNOSIS — F411 Generalized anxiety disorder: Secondary | ICD-10-CM | POA: Diagnosis not present

## 2023-05-31 DIAGNOSIS — I2510 Atherosclerotic heart disease of native coronary artery without angina pectoris: Secondary | ICD-10-CM | POA: Diagnosis not present

## 2023-05-31 DIAGNOSIS — R7303 Prediabetes: Secondary | ICD-10-CM | POA: Diagnosis not present

## 2023-05-31 DIAGNOSIS — N289 Disorder of kidney and ureter, unspecified: Secondary | ICD-10-CM | POA: Diagnosis not present

## 2023-05-31 DIAGNOSIS — I119 Hypertensive heart disease without heart failure: Secondary | ICD-10-CM | POA: Diagnosis not present

## 2023-05-31 DIAGNOSIS — K760 Fatty (change of) liver, not elsewhere classified: Secondary | ICD-10-CM | POA: Diagnosis not present

## 2023-05-31 DIAGNOSIS — G4733 Obstructive sleep apnea (adult) (pediatric): Secondary | ICD-10-CM | POA: Diagnosis not present

## 2023-05-31 DIAGNOSIS — K828 Other specified diseases of gallbladder: Secondary | ICD-10-CM | POA: Diagnosis not present

## 2023-05-31 DIAGNOSIS — I959 Hypotension, unspecified: Secondary | ICD-10-CM | POA: Diagnosis not present

## 2023-06-01 ENCOUNTER — Ambulatory Visit: Payer: Self-pay | Admitting: Licensed Clinical Social Worker

## 2023-06-01 DIAGNOSIS — K2970 Gastritis, unspecified, without bleeding: Secondary | ICD-10-CM | POA: Diagnosis not present

## 2023-06-01 DIAGNOSIS — G4733 Obstructive sleep apnea (adult) (pediatric): Secondary | ICD-10-CM | POA: Diagnosis not present

## 2023-06-01 DIAGNOSIS — K224 Dyskinesia of esophagus: Secondary | ICD-10-CM | POA: Diagnosis not present

## 2023-06-01 DIAGNOSIS — K289 Gastrojejunal ulcer, unspecified as acute or chronic, without hemorrhage or perforation: Secondary | ICD-10-CM | POA: Diagnosis not present

## 2023-06-01 DIAGNOSIS — K828 Other specified diseases of gallbladder: Secondary | ICD-10-CM | POA: Diagnosis not present

## 2023-06-01 DIAGNOSIS — R627 Adult failure to thrive: Secondary | ICD-10-CM | POA: Diagnosis not present

## 2023-06-01 DIAGNOSIS — I959 Hypotension, unspecified: Secondary | ICD-10-CM | POA: Diagnosis not present

## 2023-06-01 DIAGNOSIS — M109 Gout, unspecified: Secondary | ICD-10-CM | POA: Diagnosis not present

## 2023-06-01 DIAGNOSIS — Z431 Encounter for attention to gastrostomy: Secondary | ICD-10-CM | POA: Diagnosis not present

## 2023-06-01 DIAGNOSIS — K760 Fatty (change of) liver, not elsewhere classified: Secondary | ICD-10-CM | POA: Diagnosis not present

## 2023-06-01 DIAGNOSIS — Z48815 Encounter for surgical aftercare following surgery on the digestive system: Secondary | ICD-10-CM | POA: Diagnosis not present

## 2023-06-01 DIAGNOSIS — I2510 Atherosclerotic heart disease of native coronary artery without angina pectoris: Secondary | ICD-10-CM | POA: Diagnosis not present

## 2023-06-01 DIAGNOSIS — F411 Generalized anxiety disorder: Secondary | ICD-10-CM | POA: Diagnosis not present

## 2023-06-01 DIAGNOSIS — R7303 Prediabetes: Secondary | ICD-10-CM | POA: Diagnosis not present

## 2023-06-01 DIAGNOSIS — D649 Anemia, unspecified: Secondary | ICD-10-CM | POA: Diagnosis not present

## 2023-06-01 DIAGNOSIS — N3946 Mixed incontinence: Secondary | ICD-10-CM | POA: Diagnosis not present

## 2023-06-01 DIAGNOSIS — M179 Osteoarthritis of knee, unspecified: Secondary | ICD-10-CM | POA: Diagnosis not present

## 2023-06-01 DIAGNOSIS — E872 Acidosis, unspecified: Secondary | ICD-10-CM | POA: Diagnosis not present

## 2023-06-01 DIAGNOSIS — N289 Disorder of kidney and ureter, unspecified: Secondary | ICD-10-CM | POA: Diagnosis not present

## 2023-06-01 DIAGNOSIS — M5416 Radiculopathy, lumbar region: Secondary | ICD-10-CM | POA: Diagnosis not present

## 2023-06-01 DIAGNOSIS — I82411 Acute embolism and thrombosis of right femoral vein: Secondary | ICD-10-CM | POA: Diagnosis not present

## 2023-06-01 DIAGNOSIS — I119 Hypertensive heart disease without heart failure: Secondary | ICD-10-CM | POA: Diagnosis not present

## 2023-06-01 DIAGNOSIS — E8809 Other disorders of plasma-protein metabolism, not elsewhere classified: Secondary | ICD-10-CM | POA: Diagnosis not present

## 2023-06-01 DIAGNOSIS — I700 Atherosclerosis of aorta: Secondary | ICD-10-CM | POA: Diagnosis not present

## 2023-06-01 NOTE — Patient Outreach (Signed)
  Care Coordination   Follow Up Visit Note   06/01/2023 Name: Christine [name] MRN: [id number] DOB: [birth year]  Christine [name] is a 73 y.o. year old female who sees Frann, Mabel Mt, DO for primary care. I spoke with  Christine [name] by phone today.  What matters to the patients health and wellness today?  Patient Stated she has decreased family support (family resides in [place] ). She has challenges in completing ADLs     Goals Addressed             This Visit's Progress    Patient Stated she has decreased family support (family resides in [place] ). She has challenges in completing ADLs       Interventions:  Spoke with client via phone today about client needs and status. Discussed program support  with RN , LCSW, Pharmacist Discussed medication procurement of client. Client has prescribed medications and is taking medications as prescribed Discussed walking of client. She has a cane and a walker to use as needed Discussed sleeping issues. Client has bed in dining room area. This allows client to be close to bathroom and to be close to her kitchen Discussed dizziness of client. Client said she has a medication she is prescribed that helps her with dizziness Discussed family support. Client family resides in [place]  Used Active Listening techniques to hear client needs Discussed physical therapy support in the home.  Client said that physical therapist does in home physical therapy visit with client 1 time weekly Discussed nurse home visits. Client said that nurse visits client in the home now 1 time weekly Client does use a calendar to help her remember her appointments Thanked client for phone call with LCSW today Encouraged client to call LCSW as needed for SW support at [phone]          SDOH assessments and interventions completed:  Yes  SDOH Interventions Today    Flowsheet Row Most Recent Value  SDOH Interventions   Depression  Interventions/Treatment  Counseling  Physical Activity Interventions Other (Comments)  [client receives physical therapy session 1 time weekly at her home]  Stress Interventions Provide Counseling  [client has stress in managing medical needs]        Care Coordination Interventions:  Yes, provided   Interventions Today    Flowsheet Row Most Recent Value  Chronic Disease   Chronic disease during today's visit Other  [spoke with client about client needs]  General Interventions   General Interventions Discussed/Reviewed General Interventions Discussed, Community Resources  Education Interventions   Education Provided Provided Education  Provided Verbal Education On [employer]  Mental Health Interventions   Mental Health Discussed/Reviewed Coping Strategies  Pharmacy Interventions   Pharmacy Dicussed/Reviewed Pharmacy Topics Discussed  Safety Interventions   Safety Discussed/Reviewed Fall Risk        Follow up plan: Follow up call scheduled for 08/09/23 at 2:00 PM     Encounter Outcome:  Patient Visit Completed    Christine [name]  MSW, LCSW [place] Licensed Clinical Social Worker Direct Dial:  [phone] Fax:  [phone] Website:  [email or web address]

## 2023-06-01 NOTE — Patient Instructions (Signed)
 Visit Information  Thank you for taking time to visit with me today. Please don't hesitate to contact me if I can be of assistance to you.   Following are the goals we discussed today:   Goals Addressed             This Visit's Progress    Patient Stated she has decreased family support (family resides in [place] ). She has challenges in completing ADLs       Interventions:  Spoke with client via phone today about client needs and status. Discussed program support  with RN , LCSW, Pharmacist Discussed medication procurement of client. Client has prescribed medications and is taking medications as prescribed Discussed walking of client. She has a cane and a walker to use as needed Discussed sleeping issues. Client has bed in dining room area. This allows client to be close to bathroom and to be close to her kitchen Discussed dizziness of client. Client said she has a medication she is prescribed that helps her with dizziness Discussed family support. Client family resides in [place]  Used Active Listening techniques to hear client needs Discussed physical therapy support in the home.  Client said that physical therapist does in home physical therapy visit with client 1 time weekly Discussed nurse home visits. Client said that nurse visits client in the home now 1 time weekly Client does use a calendar to help her remember her appointments Thanked client for phone call with LCSW today Encouraged client to call LCSW as needed for SW support at [phone]          Our next appointment is by telephone on 08/09/23 at 2:00 PM   Please call the care guide team at [phone] if you need to cancel or reschedule your appointment.   If you are experiencing a Mental Health or Behavioral Health Crisis or need someone to talk to, please go to [facility] [address], [place] [phone])   The patient verbalized understanding of instructions,  educational materials, and care plan provided today and DECLINED offer to receive copy of patient instructions, educational materials, and care plan.   The patient has been provided with contact information for the care management team and has been advised to call with any health related questions or concerns.    Glendia Pear  MSW, LCSW [facility] Licensed Clinical Social Worker Direct Dial:  [phone] Fax:  [phone] Website:  [email or web address]

## 2023-06-03 DIAGNOSIS — I700 Atherosclerosis of aorta: Secondary | ICD-10-CM | POA: Diagnosis not present

## 2023-06-03 DIAGNOSIS — I959 Hypotension, unspecified: Secondary | ICD-10-CM | POA: Diagnosis not present

## 2023-06-03 DIAGNOSIS — K2970 Gastritis, unspecified, without bleeding: Secondary | ICD-10-CM | POA: Diagnosis not present

## 2023-06-03 DIAGNOSIS — M5416 Radiculopathy, lumbar region: Secondary | ICD-10-CM | POA: Diagnosis not present

## 2023-06-03 DIAGNOSIS — D649 Anemia, unspecified: Secondary | ICD-10-CM | POA: Diagnosis not present

## 2023-06-03 DIAGNOSIS — N289 Disorder of kidney and ureter, unspecified: Secondary | ICD-10-CM | POA: Diagnosis not present

## 2023-06-03 DIAGNOSIS — N3946 Mixed incontinence: Secondary | ICD-10-CM | POA: Diagnosis not present

## 2023-06-03 DIAGNOSIS — K760 Fatty (change of) liver, not elsewhere classified: Secondary | ICD-10-CM | POA: Diagnosis not present

## 2023-06-03 DIAGNOSIS — I2510 Atherosclerotic heart disease of native coronary artery without angina pectoris: Secondary | ICD-10-CM | POA: Diagnosis not present

## 2023-06-03 DIAGNOSIS — M109 Gout, unspecified: Secondary | ICD-10-CM | POA: Diagnosis not present

## 2023-06-03 DIAGNOSIS — Z431 Encounter for attention to gastrostomy: Secondary | ICD-10-CM | POA: Diagnosis not present

## 2023-06-03 DIAGNOSIS — E8809 Other disorders of plasma-protein metabolism, not elsewhere classified: Secondary | ICD-10-CM | POA: Diagnosis not present

## 2023-06-03 DIAGNOSIS — F411 Generalized anxiety disorder: Secondary | ICD-10-CM | POA: Diagnosis not present

## 2023-06-03 DIAGNOSIS — R7303 Prediabetes: Secondary | ICD-10-CM | POA: Diagnosis not present

## 2023-06-03 DIAGNOSIS — R627 Adult failure to thrive: Secondary | ICD-10-CM | POA: Diagnosis not present

## 2023-06-03 DIAGNOSIS — G4733 Obstructive sleep apnea (adult) (pediatric): Secondary | ICD-10-CM | POA: Diagnosis not present

## 2023-06-03 DIAGNOSIS — Z48815 Encounter for surgical aftercare following surgery on the digestive system: Secondary | ICD-10-CM | POA: Diagnosis not present

## 2023-06-03 DIAGNOSIS — I82411 Acute embolism and thrombosis of right femoral vein: Secondary | ICD-10-CM | POA: Diagnosis not present

## 2023-06-03 DIAGNOSIS — K289 Gastrojejunal ulcer, unspecified as acute or chronic, without hemorrhage or perforation: Secondary | ICD-10-CM | POA: Diagnosis not present

## 2023-06-03 DIAGNOSIS — E872 Acidosis, unspecified: Secondary | ICD-10-CM | POA: Diagnosis not present

## 2023-06-03 DIAGNOSIS — K224 Dyskinesia of esophagus: Secondary | ICD-10-CM | POA: Diagnosis not present

## 2023-06-03 DIAGNOSIS — I119 Hypertensive heart disease without heart failure: Secondary | ICD-10-CM | POA: Diagnosis not present

## 2023-06-03 DIAGNOSIS — K828 Other specified diseases of gallbladder: Secondary | ICD-10-CM | POA: Diagnosis not present

## 2023-06-03 DIAGNOSIS — M179 Osteoarthritis of knee, unspecified: Secondary | ICD-10-CM | POA: Diagnosis not present

## 2023-06-06 DIAGNOSIS — D649 Anemia, unspecified: Secondary | ICD-10-CM | POA: Diagnosis not present

## 2023-06-06 DIAGNOSIS — I82411 Acute embolism and thrombosis of right femoral vein: Secondary | ICD-10-CM | POA: Diagnosis not present

## 2023-06-06 DIAGNOSIS — R627 Adult failure to thrive: Secondary | ICD-10-CM | POA: Diagnosis not present

## 2023-06-06 DIAGNOSIS — I119 Hypertensive heart disease without heart failure: Secondary | ICD-10-CM | POA: Diagnosis not present

## 2023-06-06 DIAGNOSIS — I959 Hypotension, unspecified: Secondary | ICD-10-CM | POA: Diagnosis not present

## 2023-06-06 DIAGNOSIS — M5416 Radiculopathy, lumbar region: Secondary | ICD-10-CM | POA: Diagnosis not present

## 2023-06-06 DIAGNOSIS — N3946 Mixed incontinence: Secondary | ICD-10-CM | POA: Diagnosis not present

## 2023-06-06 DIAGNOSIS — K760 Fatty (change of) liver, not elsewhere classified: Secondary | ICD-10-CM | POA: Diagnosis not present

## 2023-06-06 DIAGNOSIS — F411 Generalized anxiety disorder: Secondary | ICD-10-CM | POA: Diagnosis not present

## 2023-06-06 DIAGNOSIS — Z48815 Encounter for surgical aftercare following surgery on the digestive system: Secondary | ICD-10-CM | POA: Diagnosis not present

## 2023-06-06 DIAGNOSIS — E872 Acidosis, unspecified: Secondary | ICD-10-CM | POA: Diagnosis not present

## 2023-06-06 DIAGNOSIS — K289 Gastrojejunal ulcer, unspecified as acute or chronic, without hemorrhage or perforation: Secondary | ICD-10-CM | POA: Diagnosis not present

## 2023-06-06 DIAGNOSIS — K2970 Gastritis, unspecified, without bleeding: Secondary | ICD-10-CM | POA: Diagnosis not present

## 2023-06-06 DIAGNOSIS — I2510 Atherosclerotic heart disease of native coronary artery without angina pectoris: Secondary | ICD-10-CM | POA: Diagnosis not present

## 2023-06-06 DIAGNOSIS — K224 Dyskinesia of esophagus: Secondary | ICD-10-CM | POA: Diagnosis not present

## 2023-06-06 DIAGNOSIS — M179 Osteoarthritis of knee, unspecified: Secondary | ICD-10-CM | POA: Diagnosis not present

## 2023-06-06 DIAGNOSIS — M109 Gout, unspecified: Secondary | ICD-10-CM | POA: Diagnosis not present

## 2023-06-06 DIAGNOSIS — G4733 Obstructive sleep apnea (adult) (pediatric): Secondary | ICD-10-CM | POA: Diagnosis not present

## 2023-06-06 DIAGNOSIS — K828 Other specified diseases of gallbladder: Secondary | ICD-10-CM | POA: Diagnosis not present

## 2023-06-06 DIAGNOSIS — I700 Atherosclerosis of aorta: Secondary | ICD-10-CM | POA: Diagnosis not present

## 2023-06-06 DIAGNOSIS — E8809 Other disorders of plasma-protein metabolism, not elsewhere classified: Secondary | ICD-10-CM | POA: Diagnosis not present

## 2023-06-06 DIAGNOSIS — Z431 Encounter for attention to gastrostomy: Secondary | ICD-10-CM | POA: Diagnosis not present

## 2023-06-06 DIAGNOSIS — N289 Disorder of kidney and ureter, unspecified: Secondary | ICD-10-CM | POA: Diagnosis not present

## 2023-06-06 DIAGNOSIS — R7303 Prediabetes: Secondary | ICD-10-CM | POA: Diagnosis not present

## 2023-06-10 ENCOUNTER — Telehealth: Payer: Self-pay | Admitting: Neurology

## 2023-06-10 DIAGNOSIS — R627 Adult failure to thrive: Secondary | ICD-10-CM | POA: Diagnosis not present

## 2023-06-10 DIAGNOSIS — N3946 Mixed incontinence: Secondary | ICD-10-CM | POA: Diagnosis not present

## 2023-06-10 DIAGNOSIS — I82411 Acute embolism and thrombosis of right femoral vein: Secondary | ICD-10-CM | POA: Diagnosis not present

## 2023-06-10 DIAGNOSIS — E872 Acidosis, unspecified: Secondary | ICD-10-CM | POA: Diagnosis not present

## 2023-06-10 DIAGNOSIS — F411 Generalized anxiety disorder: Secondary | ICD-10-CM | POA: Diagnosis not present

## 2023-06-10 DIAGNOSIS — Z431 Encounter for attention to gastrostomy: Secondary | ICD-10-CM | POA: Diagnosis not present

## 2023-06-10 DIAGNOSIS — N289 Disorder of kidney and ureter, unspecified: Secondary | ICD-10-CM | POA: Diagnosis not present

## 2023-06-10 DIAGNOSIS — K828 Other specified diseases of gallbladder: Secondary | ICD-10-CM | POA: Diagnosis not present

## 2023-06-10 DIAGNOSIS — M109 Gout, unspecified: Secondary | ICD-10-CM | POA: Diagnosis not present

## 2023-06-10 DIAGNOSIS — I2510 Atherosclerotic heart disease of native coronary artery without angina pectoris: Secondary | ICD-10-CM | POA: Diagnosis not present

## 2023-06-10 DIAGNOSIS — I119 Hypertensive heart disease without heart failure: Secondary | ICD-10-CM | POA: Diagnosis not present

## 2023-06-10 DIAGNOSIS — K289 Gastrojejunal ulcer, unspecified as acute or chronic, without hemorrhage or perforation: Secondary | ICD-10-CM | POA: Diagnosis not present

## 2023-06-10 DIAGNOSIS — K224 Dyskinesia of esophagus: Secondary | ICD-10-CM | POA: Diagnosis not present

## 2023-06-10 DIAGNOSIS — M5416 Radiculopathy, lumbar region: Secondary | ICD-10-CM | POA: Diagnosis not present

## 2023-06-10 DIAGNOSIS — I959 Hypotension, unspecified: Secondary | ICD-10-CM | POA: Diagnosis not present

## 2023-06-10 DIAGNOSIS — R7303 Prediabetes: Secondary | ICD-10-CM | POA: Diagnosis not present

## 2023-06-10 DIAGNOSIS — Z48815 Encounter for surgical aftercare following surgery on the digestive system: Secondary | ICD-10-CM | POA: Diagnosis not present

## 2023-06-10 DIAGNOSIS — M179 Osteoarthritis of knee, unspecified: Secondary | ICD-10-CM | POA: Diagnosis not present

## 2023-06-10 DIAGNOSIS — D649 Anemia, unspecified: Secondary | ICD-10-CM | POA: Diagnosis not present

## 2023-06-10 DIAGNOSIS — K760 Fatty (change of) liver, not elsewhere classified: Secondary | ICD-10-CM | POA: Diagnosis not present

## 2023-06-10 DIAGNOSIS — I700 Atherosclerosis of aorta: Secondary | ICD-10-CM | POA: Diagnosis not present

## 2023-06-10 DIAGNOSIS — G4733 Obstructive sleep apnea (adult) (pediatric): Secondary | ICD-10-CM | POA: Diagnosis not present

## 2023-06-10 DIAGNOSIS — K2970 Gastritis, unspecified, without bleeding: Secondary | ICD-10-CM | POA: Diagnosis not present

## 2023-06-10 DIAGNOSIS — E8809 Other disorders of plasma-protein metabolism, not elsewhere classified: Secondary | ICD-10-CM | POA: Diagnosis not present

## 2023-06-10 NOTE — Telephone Encounter (Signed)
FYI  Copied from CRM [id number]. Topic: Clinical - Home Health Verbal Orders >> Jun 10, 2023 11:17 AM Maxwell Marion wrote: Judeth Cornfield, OT with [facility] called to let the doctor know the patient rescheduled this week's visit for next week. Her call back number is [phone] if there are any questions or concerns.

## 2023-06-13 DIAGNOSIS — I2510 Atherosclerotic heart disease of native coronary artery without angina pectoris: Secondary | ICD-10-CM | POA: Diagnosis not present

## 2023-06-13 DIAGNOSIS — E872 Acidosis, unspecified: Secondary | ICD-10-CM | POA: Diagnosis not present

## 2023-06-13 DIAGNOSIS — N289 Disorder of kidney and ureter, unspecified: Secondary | ICD-10-CM | POA: Diagnosis not present

## 2023-06-13 DIAGNOSIS — I82411 Acute embolism and thrombosis of right femoral vein: Secondary | ICD-10-CM | POA: Diagnosis not present

## 2023-06-13 DIAGNOSIS — K760 Fatty (change of) liver, not elsewhere classified: Secondary | ICD-10-CM | POA: Diagnosis not present

## 2023-06-13 DIAGNOSIS — I700 Atherosclerosis of aorta: Secondary | ICD-10-CM | POA: Diagnosis not present

## 2023-06-13 DIAGNOSIS — Z431 Encounter for attention to gastrostomy: Secondary | ICD-10-CM | POA: Diagnosis not present

## 2023-06-13 DIAGNOSIS — N3946 Mixed incontinence: Secondary | ICD-10-CM | POA: Diagnosis not present

## 2023-06-13 DIAGNOSIS — M109 Gout, unspecified: Secondary | ICD-10-CM | POA: Diagnosis not present

## 2023-06-13 DIAGNOSIS — F411 Generalized anxiety disorder: Secondary | ICD-10-CM | POA: Diagnosis not present

## 2023-06-13 DIAGNOSIS — G4733 Obstructive sleep apnea (adult) (pediatric): Secondary | ICD-10-CM | POA: Diagnosis not present

## 2023-06-13 DIAGNOSIS — K2970 Gastritis, unspecified, without bleeding: Secondary | ICD-10-CM | POA: Diagnosis not present

## 2023-06-13 DIAGNOSIS — I959 Hypotension, unspecified: Secondary | ICD-10-CM | POA: Diagnosis not present

## 2023-06-13 DIAGNOSIS — E8809 Other disorders of plasma-protein metabolism, not elsewhere classified: Secondary | ICD-10-CM | POA: Diagnosis not present

## 2023-06-13 DIAGNOSIS — K289 Gastrojejunal ulcer, unspecified as acute or chronic, without hemorrhage or perforation: Secondary | ICD-10-CM | POA: Diagnosis not present

## 2023-06-13 DIAGNOSIS — Z48815 Encounter for surgical aftercare following surgery on the digestive system: Secondary | ICD-10-CM | POA: Diagnosis not present

## 2023-06-13 DIAGNOSIS — K224 Dyskinesia of esophagus: Secondary | ICD-10-CM | POA: Diagnosis not present

## 2023-06-13 DIAGNOSIS — M5416 Radiculopathy, lumbar region: Secondary | ICD-10-CM | POA: Diagnosis not present

## 2023-06-13 DIAGNOSIS — K828 Other specified diseases of gallbladder: Secondary | ICD-10-CM | POA: Diagnosis not present

## 2023-06-13 DIAGNOSIS — D649 Anemia, unspecified: Secondary | ICD-10-CM | POA: Diagnosis not present

## 2023-06-13 DIAGNOSIS — R7303 Prediabetes: Secondary | ICD-10-CM | POA: Diagnosis not present

## 2023-06-13 DIAGNOSIS — M179 Osteoarthritis of knee, unspecified: Secondary | ICD-10-CM | POA: Diagnosis not present

## 2023-06-13 DIAGNOSIS — R627 Adult failure to thrive: Secondary | ICD-10-CM | POA: Diagnosis not present

## 2023-06-13 DIAGNOSIS — I119 Hypertensive heart disease without heart failure: Secondary | ICD-10-CM | POA: Diagnosis not present

## 2023-06-14 DIAGNOSIS — I959 Hypotension, unspecified: Secondary | ICD-10-CM | POA: Diagnosis not present

## 2023-06-14 DIAGNOSIS — K760 Fatty (change of) liver, not elsewhere classified: Secondary | ICD-10-CM | POA: Diagnosis not present

## 2023-06-14 DIAGNOSIS — M5416 Radiculopathy, lumbar region: Secondary | ICD-10-CM | POA: Diagnosis not present

## 2023-06-14 DIAGNOSIS — M179 Osteoarthritis of knee, unspecified: Secondary | ICD-10-CM | POA: Diagnosis not present

## 2023-06-14 DIAGNOSIS — K828 Other specified diseases of gallbladder: Secondary | ICD-10-CM | POA: Diagnosis not present

## 2023-06-14 DIAGNOSIS — I119 Hypertensive heart disease without heart failure: Secondary | ICD-10-CM | POA: Diagnosis not present

## 2023-06-14 DIAGNOSIS — R627 Adult failure to thrive: Secondary | ICD-10-CM | POA: Diagnosis not present

## 2023-06-14 DIAGNOSIS — G4733 Obstructive sleep apnea (adult) (pediatric): Secondary | ICD-10-CM | POA: Diagnosis not present

## 2023-06-14 DIAGNOSIS — K224 Dyskinesia of esophagus: Secondary | ICD-10-CM | POA: Diagnosis not present

## 2023-06-14 DIAGNOSIS — K289 Gastrojejunal ulcer, unspecified as acute or chronic, without hemorrhage or perforation: Secondary | ICD-10-CM | POA: Diagnosis not present

## 2023-06-14 DIAGNOSIS — K2970 Gastritis, unspecified, without bleeding: Secondary | ICD-10-CM | POA: Diagnosis not present

## 2023-06-14 DIAGNOSIS — Z431 Encounter for attention to gastrostomy: Secondary | ICD-10-CM | POA: Diagnosis not present

## 2023-06-14 DIAGNOSIS — I700 Atherosclerosis of aorta: Secondary | ICD-10-CM | POA: Diagnosis not present

## 2023-06-14 DIAGNOSIS — Z48815 Encounter for surgical aftercare following surgery on the digestive system: Secondary | ICD-10-CM | POA: Diagnosis not present

## 2023-06-14 DIAGNOSIS — E872 Acidosis, unspecified: Secondary | ICD-10-CM | POA: Diagnosis not present

## 2023-06-14 DIAGNOSIS — M109 Gout, unspecified: Secondary | ICD-10-CM | POA: Diagnosis not present

## 2023-06-14 DIAGNOSIS — R7303 Prediabetes: Secondary | ICD-10-CM | POA: Diagnosis not present

## 2023-06-14 DIAGNOSIS — D649 Anemia, unspecified: Secondary | ICD-10-CM | POA: Diagnosis not present

## 2023-06-14 DIAGNOSIS — I82411 Acute embolism and thrombosis of right femoral vein: Secondary | ICD-10-CM | POA: Diagnosis not present

## 2023-06-14 DIAGNOSIS — N3946 Mixed incontinence: Secondary | ICD-10-CM | POA: Diagnosis not present

## 2023-06-14 DIAGNOSIS — N289 Disorder of kidney and ureter, unspecified: Secondary | ICD-10-CM | POA: Diagnosis not present

## 2023-06-14 DIAGNOSIS — E8809 Other disorders of plasma-protein metabolism, not elsewhere classified: Secondary | ICD-10-CM | POA: Diagnosis not present

## 2023-06-14 DIAGNOSIS — I2510 Atherosclerotic heart disease of native coronary artery without angina pectoris: Secondary | ICD-10-CM | POA: Diagnosis not present

## 2023-06-14 DIAGNOSIS — F411 Generalized anxiety disorder: Secondary | ICD-10-CM | POA: Diagnosis not present

## 2023-06-16 DIAGNOSIS — I119 Hypertensive heart disease without heart failure: Secondary | ICD-10-CM | POA: Diagnosis not present

## 2023-06-16 DIAGNOSIS — G4733 Obstructive sleep apnea (adult) (pediatric): Secondary | ICD-10-CM | POA: Diagnosis not present

## 2023-06-16 DIAGNOSIS — D649 Anemia, unspecified: Secondary | ICD-10-CM | POA: Diagnosis not present

## 2023-06-16 DIAGNOSIS — N289 Disorder of kidney and ureter, unspecified: Secondary | ICD-10-CM | POA: Diagnosis not present

## 2023-06-16 DIAGNOSIS — I82411 Acute embolism and thrombosis of right femoral vein: Secondary | ICD-10-CM | POA: Diagnosis not present

## 2023-06-16 DIAGNOSIS — K760 Fatty (change of) liver, not elsewhere classified: Secondary | ICD-10-CM | POA: Diagnosis not present

## 2023-06-16 DIAGNOSIS — E8809 Other disorders of plasma-protein metabolism, not elsewhere classified: Secondary | ICD-10-CM | POA: Diagnosis not present

## 2023-06-16 DIAGNOSIS — Z431 Encounter for attention to gastrostomy: Secondary | ICD-10-CM | POA: Diagnosis not present

## 2023-06-16 DIAGNOSIS — I2510 Atherosclerotic heart disease of native coronary artery without angina pectoris: Secondary | ICD-10-CM | POA: Diagnosis not present

## 2023-06-16 DIAGNOSIS — R627 Adult failure to thrive: Secondary | ICD-10-CM | POA: Diagnosis not present

## 2023-06-16 DIAGNOSIS — F411 Generalized anxiety disorder: Secondary | ICD-10-CM | POA: Diagnosis not present

## 2023-06-16 DIAGNOSIS — K828 Other specified diseases of gallbladder: Secondary | ICD-10-CM | POA: Diagnosis not present

## 2023-06-16 DIAGNOSIS — M5416 Radiculopathy, lumbar region: Secondary | ICD-10-CM | POA: Diagnosis not present

## 2023-06-16 DIAGNOSIS — K289 Gastrojejunal ulcer, unspecified as acute or chronic, without hemorrhage or perforation: Secondary | ICD-10-CM | POA: Diagnosis not present

## 2023-06-16 DIAGNOSIS — K224 Dyskinesia of esophagus: Secondary | ICD-10-CM | POA: Diagnosis not present

## 2023-06-16 DIAGNOSIS — N3946 Mixed incontinence: Secondary | ICD-10-CM | POA: Diagnosis not present

## 2023-06-16 DIAGNOSIS — Z48815 Encounter for surgical aftercare following surgery on the digestive system: Secondary | ICD-10-CM | POA: Diagnosis not present

## 2023-06-16 DIAGNOSIS — K2970 Gastritis, unspecified, without bleeding: Secondary | ICD-10-CM | POA: Diagnosis not present

## 2023-06-16 DIAGNOSIS — I959 Hypotension, unspecified: Secondary | ICD-10-CM | POA: Diagnosis not present

## 2023-06-16 DIAGNOSIS — E872 Acidosis, unspecified: Secondary | ICD-10-CM | POA: Diagnosis not present

## 2023-06-16 DIAGNOSIS — M109 Gout, unspecified: Secondary | ICD-10-CM | POA: Diagnosis not present

## 2023-06-16 DIAGNOSIS — R7303 Prediabetes: Secondary | ICD-10-CM | POA: Diagnosis not present

## 2023-06-16 DIAGNOSIS — I700 Atherosclerosis of aorta: Secondary | ICD-10-CM | POA: Diagnosis not present

## 2023-06-16 DIAGNOSIS — M179 Osteoarthritis of knee, unspecified: Secondary | ICD-10-CM | POA: Diagnosis not present

## 2023-06-17 ENCOUNTER — Telehealth: Payer: Self-pay

## 2023-06-17 ENCOUNTER — Other Ambulatory Visit: Payer: Self-pay

## 2023-06-17 ENCOUNTER — Other Ambulatory Visit (HOSPITAL_COMMUNITY): Payer: Self-pay

## 2023-06-17 NOTE — Telephone Encounter (Signed)
Can we get her in with Christine [name] to help with this please? If pt is almost out, I am OK with getting her some samples.

## 2023-06-17 NOTE — Telephone Encounter (Signed)
Copied from CRM [id number]. Topic: Clinical - Prescription Issue >> Jun 17, 2023  2:46 PM Fredrich Romans wrote: Reason for CRM: Patient called in stating that medication  apixaban (ELIQUIS) 5 MG TABS tablet Is costing her over a hundred dollars to purchase.She would like to know is there an alternative medication that may be cheaper that could be prescribed to her?

## 2023-06-18 ENCOUNTER — Other Ambulatory Visit (HOSPITAL_COMMUNITY): Payer: Self-pay

## 2023-06-19 ENCOUNTER — Encounter: Payer: Self-pay | Admitting: Family Medicine

## 2023-06-20 ENCOUNTER — Telehealth: Payer: Self-pay | Admitting: Neurology

## 2023-06-20 ENCOUNTER — Other Ambulatory Visit: Payer: Self-pay

## 2023-06-20 ENCOUNTER — Other Ambulatory Visit (HOSPITAL_COMMUNITY): Payer: Self-pay

## 2023-06-20 ENCOUNTER — Encounter: Payer: Self-pay | Admitting: Pharmacist

## 2023-06-20 DIAGNOSIS — M109 Gout, unspecified: Secondary | ICD-10-CM | POA: Diagnosis not present

## 2023-06-20 DIAGNOSIS — D649 Anemia, unspecified: Secondary | ICD-10-CM | POA: Diagnosis not present

## 2023-06-20 DIAGNOSIS — K2970 Gastritis, unspecified, without bleeding: Secondary | ICD-10-CM | POA: Diagnosis not present

## 2023-06-20 DIAGNOSIS — I82411 Acute embolism and thrombosis of right femoral vein: Secondary | ICD-10-CM | POA: Diagnosis not present

## 2023-06-20 DIAGNOSIS — M5416 Radiculopathy, lumbar region: Secondary | ICD-10-CM | POA: Diagnosis not present

## 2023-06-20 DIAGNOSIS — E872 Acidosis, unspecified: Secondary | ICD-10-CM | POA: Diagnosis not present

## 2023-06-20 DIAGNOSIS — N289 Disorder of kidney and ureter, unspecified: Secondary | ICD-10-CM | POA: Diagnosis not present

## 2023-06-20 DIAGNOSIS — K828 Other specified diseases of gallbladder: Secondary | ICD-10-CM | POA: Diagnosis not present

## 2023-06-20 DIAGNOSIS — Z431 Encounter for attention to gastrostomy: Secondary | ICD-10-CM | POA: Diagnosis not present

## 2023-06-20 DIAGNOSIS — I700 Atherosclerosis of aorta: Secondary | ICD-10-CM | POA: Diagnosis not present

## 2023-06-20 DIAGNOSIS — E8809 Other disorders of plasma-protein metabolism, not elsewhere classified: Secondary | ICD-10-CM | POA: Diagnosis not present

## 2023-06-20 DIAGNOSIS — I2510 Atherosclerotic heart disease of native coronary artery without angina pectoris: Secondary | ICD-10-CM | POA: Diagnosis not present

## 2023-06-20 DIAGNOSIS — N3946 Mixed incontinence: Secondary | ICD-10-CM | POA: Diagnosis not present

## 2023-06-20 DIAGNOSIS — I119 Hypertensive heart disease without heart failure: Secondary | ICD-10-CM | POA: Diagnosis not present

## 2023-06-20 DIAGNOSIS — M179 Osteoarthritis of knee, unspecified: Secondary | ICD-10-CM | POA: Diagnosis not present

## 2023-06-20 DIAGNOSIS — F411 Generalized anxiety disorder: Secondary | ICD-10-CM | POA: Diagnosis not present

## 2023-06-20 DIAGNOSIS — K224 Dyskinesia of esophagus: Secondary | ICD-10-CM | POA: Diagnosis not present

## 2023-06-20 DIAGNOSIS — Z48815 Encounter for surgical aftercare following surgery on the digestive system: Secondary | ICD-10-CM | POA: Diagnosis not present

## 2023-06-20 DIAGNOSIS — R7303 Prediabetes: Secondary | ICD-10-CM | POA: Diagnosis not present

## 2023-06-20 DIAGNOSIS — I959 Hypotension, unspecified: Secondary | ICD-10-CM | POA: Diagnosis not present

## 2023-06-20 DIAGNOSIS — K289 Gastrojejunal ulcer, unspecified as acute or chronic, without hemorrhage or perforation: Secondary | ICD-10-CM | POA: Diagnosis not present

## 2023-06-20 DIAGNOSIS — G4733 Obstructive sleep apnea (adult) (pediatric): Secondary | ICD-10-CM | POA: Diagnosis not present

## 2023-06-20 DIAGNOSIS — K760 Fatty (change of) liver, not elsewhere classified: Secondary | ICD-10-CM | POA: Diagnosis not present

## 2023-06-20 DIAGNOSIS — R627 Adult failure to thrive: Secondary | ICD-10-CM | POA: Diagnosis not present

## 2023-06-20 NOTE — Telephone Encounter (Signed)
 Called Home Health verbal ordered was given   Home Health Verbal Orders >> Jun 20, 2023 11:12 AM Pascal Lux wrote: Caller/Agency: [facility]  Callback Number: [phone] Service Requested: Skilled Nursing. Physical Therapy  Frequency: 1 week 4 wound monitoring - 1 week 4

## 2023-06-20 NOTE — Telephone Encounter (Signed)
 Copied from CRM [id number]. Topic: Clinical - Home Health Verbal Orders >> Jun 20, 2023 11:12 AM Pascal Lux wrote: Caller/Agency: [facility]  Callback Number: [phone] Service Requested: Skilled Nursing. Physical Therapy  Frequency: 1 week 4 wound monitoring - 1 week 4 Any new concerns about the patient? Yes, stated patient has a lump on belly button that ruptured overnight and there is pinkish-brown drainage and she may need to go back on antibiotics.

## 2023-06-20 NOTE — Telephone Encounter (Signed)
 Spoke with pt and provider about doing VV. Dr.wendling said it would be ok to do if he could see the area. Lvm letting the pt known VV for 145 tomorrow and sent mychart message.

## 2023-06-21 ENCOUNTER — Telehealth (INDEPENDENT_AMBULATORY_CARE_PROVIDER_SITE_OTHER): Payer: Medicare HMO | Admitting: Family Medicine

## 2023-06-21 ENCOUNTER — Other Ambulatory Visit: Payer: Self-pay

## 2023-06-21 ENCOUNTER — Telehealth: Payer: Self-pay

## 2023-06-21 ENCOUNTER — Encounter: Payer: Self-pay | Admitting: Family Medicine

## 2023-06-21 ENCOUNTER — Other Ambulatory Visit (HOSPITAL_COMMUNITY): Payer: Self-pay

## 2023-06-21 DIAGNOSIS — R627 Adult failure to thrive: Secondary | ICD-10-CM | POA: Diagnosis not present

## 2023-06-21 DIAGNOSIS — I2510 Atherosclerotic heart disease of native coronary artery without angina pectoris: Secondary | ICD-10-CM | POA: Diagnosis not present

## 2023-06-21 DIAGNOSIS — M179 Osteoarthritis of knee, unspecified: Secondary | ICD-10-CM | POA: Diagnosis not present

## 2023-06-21 DIAGNOSIS — D649 Anemia, unspecified: Secondary | ICD-10-CM | POA: Diagnosis not present

## 2023-06-21 DIAGNOSIS — I700 Atherosclerosis of aorta: Secondary | ICD-10-CM | POA: Diagnosis not present

## 2023-06-21 DIAGNOSIS — L989 Disorder of the skin and subcutaneous tissue, unspecified: Secondary | ICD-10-CM | POA: Diagnosis not present

## 2023-06-21 DIAGNOSIS — R42 Dizziness and giddiness: Secondary | ICD-10-CM

## 2023-06-21 DIAGNOSIS — F411 Generalized anxiety disorder: Secondary | ICD-10-CM | POA: Diagnosis not present

## 2023-06-21 DIAGNOSIS — I959 Hypotension, unspecified: Secondary | ICD-10-CM | POA: Diagnosis not present

## 2023-06-21 DIAGNOSIS — E872 Acidosis, unspecified: Secondary | ICD-10-CM | POA: Diagnosis not present

## 2023-06-21 DIAGNOSIS — K289 Gastrojejunal ulcer, unspecified as acute or chronic, without hemorrhage or perforation: Secondary | ICD-10-CM | POA: Diagnosis not present

## 2023-06-21 DIAGNOSIS — N289 Disorder of kidney and ureter, unspecified: Secondary | ICD-10-CM | POA: Diagnosis not present

## 2023-06-21 DIAGNOSIS — Z48815 Encounter for surgical aftercare following surgery on the digestive system: Secondary | ICD-10-CM | POA: Diagnosis not present

## 2023-06-21 DIAGNOSIS — G4733 Obstructive sleep apnea (adult) (pediatric): Secondary | ICD-10-CM | POA: Diagnosis not present

## 2023-06-21 DIAGNOSIS — I119 Hypertensive heart disease without heart failure: Secondary | ICD-10-CM | POA: Diagnosis not present

## 2023-06-21 DIAGNOSIS — Z431 Encounter for attention to gastrostomy: Secondary | ICD-10-CM | POA: Diagnosis not present

## 2023-06-21 DIAGNOSIS — M109 Gout, unspecified: Secondary | ICD-10-CM | POA: Diagnosis not present

## 2023-06-21 DIAGNOSIS — K828 Other specified diseases of gallbladder: Secondary | ICD-10-CM | POA: Diagnosis not present

## 2023-06-21 DIAGNOSIS — K760 Fatty (change of) liver, not elsewhere classified: Secondary | ICD-10-CM | POA: Diagnosis not present

## 2023-06-21 DIAGNOSIS — R7303 Prediabetes: Secondary | ICD-10-CM | POA: Diagnosis not present

## 2023-06-21 DIAGNOSIS — K2970 Gastritis, unspecified, without bleeding: Secondary | ICD-10-CM | POA: Diagnosis not present

## 2023-06-21 DIAGNOSIS — M5416 Radiculopathy, lumbar region: Secondary | ICD-10-CM | POA: Diagnosis not present

## 2023-06-21 DIAGNOSIS — K224 Dyskinesia of esophagus: Secondary | ICD-10-CM | POA: Diagnosis not present

## 2023-06-21 DIAGNOSIS — N3946 Mixed incontinence: Secondary | ICD-10-CM | POA: Diagnosis not present

## 2023-06-21 DIAGNOSIS — I82411 Acute embolism and thrombosis of right femoral vein: Secondary | ICD-10-CM | POA: Diagnosis not present

## 2023-06-21 DIAGNOSIS — E8809 Other disorders of plasma-protein metabolism, not elsewhere classified: Secondary | ICD-10-CM | POA: Diagnosis not present

## 2023-06-21 MED ORDER — COLCHICINE 0.6 MG PO TABS
0.6000 mg | ORAL_TABLET | Freq: Every day | ORAL | 0 refills | Status: DC
  Filled 2023-06-21 (×2): qty 30, 30d supply, fill #0

## 2023-06-21 MED ORDER — FUROSEMIDE 20 MG PO TABS
20.0000 mg | ORAL_TABLET | Freq: Every day | ORAL | 0 refills | Status: AC
  Filled 2023-06-21 (×2): qty 30, 30d supply, fill #0

## 2023-06-21 MED ORDER — PANTOPRAZOLE SODIUM 40 MG PO TBEC
40.0000 mg | DELAYED_RELEASE_TABLET | Freq: Every day | ORAL | 0 refills | Status: DC
  Filled 2023-06-21 (×2): qty 30, 30d supply, fill #0

## 2023-06-21 NOTE — Telephone Encounter (Signed)
 Called pt was advised of appt for today.

## 2023-06-21 NOTE — Progress Notes (Signed)
 Chief Complaint  Patient presents with   Mass    Lump pinkish-brown/red  drainage    Christine [name] is a 73 y.o. female here for a skin complaint. We are interacting via web portal for an electronic face-to-face visit. I verified patient's ID using 2 identifiers. Patient agreed to proceed with visit via this method. Patient is at home, I am at office. Patient and I are present for visit.   Duration: 2 days Location: just over belly button Pruritic? No Painful? Yes before it popped Drainage? Yes- brownish/bloody, thick Trauma? No Fevers? No Other associated symptoms: no odor or redness Therapies tried thus far: dressing/wound packing from home health  Patient had a DVT during her hospitalization and was placed on 4 months of anticoagulation.  She was not very mobile and her skilled nursing facility and this was thought to be a provoked DVT. She is currently on Eliquis 5 mg bid. She is on a fixed income and the cost will soon rise to $150 per month.   Past Medical History:  Diagnosis Date   Allergy    dust, pollen, sulfa, prednisone   Anemia    Anxiety    Arthritis    "knees" (04/26/2016)   Colon polyps    benign per pt   Coronary artery disease    mild per 2015 cath in [place] (OM1 30%, RCA 30%)   GERD (gastroesophageal reflux disease)    Gout    History of hiatal hernia    Hyperlipidemia 05/20/2021   Hypertension    Migraine    "none since early /2017" (05/06/2016)   Obesity    OSA on CPAP    uses CPAP   Pre-diabetes    Pre-operative cardiovascular examination 08/22/2008   Renal insufficiency 11/09/2022   Vasculitis (HCC)    Bilateral   No conversational dyspnea Age appropriate judgment and insight Nml affect and mood  Skin lesion  Abscess vs fistula. If it continues to drain, will get CT abd for further evaluation. If it resolves, will cont supportive care. Eliquis samples provided. Will ask our PharmD for help on this matter.  F/u prn. The patient voiced  understanding and agreement to the plan.  [name], [place] 06/21/23 2:34 PM

## 2023-06-21 NOTE — Telephone Encounter (Signed)
 Copied from CRM [id number]. Topic: General - Other >> Jun 20, 2023  5:20 PM Denese Killings wrote: Reason for CRM: Patient is returning a call from Dr. Venida Jarvis nurse.

## 2023-06-22 ENCOUNTER — Other Ambulatory Visit (HOSPITAL_COMMUNITY): Payer: Self-pay

## 2023-06-22 ENCOUNTER — Ambulatory Visit: Payer: Medicare HMO | Admitting: Family Medicine

## 2023-06-22 NOTE — Telephone Encounter (Signed)
 Called pt VV Friday 1130 F/u and medication up front for pick up.

## 2023-06-23 ENCOUNTER — Other Ambulatory Visit (HOSPITAL_COMMUNITY): Payer: Self-pay

## 2023-06-23 ENCOUNTER — Encounter: Payer: Self-pay | Admitting: Pharmacist

## 2023-06-23 NOTE — Progress Notes (Signed)
 06/23/2023 Name: Christine [name] MRN: [id number] DOB: 1950-08-30  Chief Complaint  Patient presents with   Medication Management    Christine [name] is a 73 y.o. year old female who presented for a telephone visit.   They were referred to the pharmacist by their PCP for assistance in managing medication access.    Subjective:  Patient was diagnosed with an Acute DVT in the left common femoral vein by venous doppler on 04/10/2024. She was started on Eliquis 5mg  twice a day. Prior to admission, she had been in a skilled nursing facility and had not been very mobile. The plan per Dr Hollie Beach note on 04/29/2023 was to continue Eliquis for at least 3 months (thru 07/10/2023) depending on her mobility / activity level in March 2025.   She reported that cost of Eliquis was too expensive but cost was $0 03/2023 - confirmed with [name] that they used a 1 time per lifetime card to get 30 days free in December.   Care Team: Primary Care Provider: [name], DO ; Next Scheduled Visit: 06/24/2023 and 07/11/2023  Medication Access/Adherence  Current Pharmacy:  [facility] [address] [place] [address] Phone: [phone] Fax: [phone]  [id number] - [place], [place] - [address] AT [facility] [address] [place] [address] Phone: [phone] Fax: [phone]   Patient reports affordability concerns with their medications: Yes  Patient reports access/transportation concerns to their pharmacy: No  Patient reports adherence concerns with their medications:  No         Objective:  Lab Results  Component Value Date   HGBA1C 6.3 (H) 09/01/2022    Lab Results  Component Value Date   CREATININE 0.85 05/10/2023   BUN 22 05/10/2023   NA 139 05/10/2023   K 3.9 05/10/2023   CL 111 05/10/2023   CO2 20 (L) 05/10/2023    Lab Results   Component Value Date   CHOL 201 (H) 05/21/2022   HDL 89.50 05/21/2022   LDLCALC 96 05/21/2022   TRIG 78.0 05/21/2022   CHOLHDL 2 05/21/2022    Medications Reviewed Today     Reviewed by Henrene Pastor, RPH-CPP (Pharmacist) on 06/23/23 at 1034  Med List Status: <None>   Medication Order Taking? Sig Documenting Provider Last Dose Status Informant  acetaminophen (TYLENOL) 325 MG tablet [id number]  Take 650 mg by mouth every 4 (four) hours as needed for mild pain or moderate pain. [provider]  Active Nursing Home Medication Administration Guide [facility]), Pharmacy Records           Med Note Wyline Mood   Thu May 05, 2023  3:04 PM) As needed per patient  apixaban (ELIQUIS) 5 MG TABS tablet [id number] Yes Place 1 tablet (5 mg total) into feeding tube 2 (two) times daily. Almon Hercules, MD Taking Active            Med Note [name], Ishmael Holter Apr 28, 2023 12:15 PM) Pt states taking by mouth not by feeding tube  ascorbic acid (VITAMIN C) 500 MG tablet [id number]  Place 1 tablet (500 mg total) into feeding tube daily. Almon Hercules, MD  Active            Med Note [name], [name]   Thu Apr 28, 2023 12:15 PM) Pt states taking by mouth not by feeding tube  colchicine 0.6  MG tablet [id number]  Take 1 tablet (0.6 mg total) by mouth daily. [name], DO  Active   ferrous sulfate 325 (65 FE) MG EC tablet [id number]  Take 1 tablet (325 mg total) by mouth in the morning and at bedtime. Almon Hercules, MD  Active   furosemide (LASIX) 20 MG tablet [id number]  Take 1 tablet (20 mg total) by mouth daily. [name], DO  Active   meclizine (ANTIVERT) 12.5 MG tablet [id number]  Take 1 tablet (12.5 mg total) by mouth 3 (three) times daily as needed for dizziness. [name], DO  Active   Multiple Vitamin (MULTIVITAMIN WITH MINERALS) TABS tablet [id number]  Place 1 tablet into feeding tube daily. Almon Hercules, MD  Active   ondansetron (ZOFRAN) 4 MG tablet  [id number]  Place 1 tablet (4 mg total) into feeding tube every 6 (six) hours as needed for nausea. Marguerita Merles [place], DO  Active            Med Note Wyline Mood   Thu May 05, 2023  3:05 PM) As needed, by mouth, per patient  pantoprazole (PROTONIX) 40 MG tablet [id number]  Take 1 tablet (40 mg total) by mouth daily. [name], DO  Active   polyethylene glycol powder (MIRALAX) 17 GM/SCOOP powder [id number]  Take 17 g by mouth 2 (two) times daily as needed for mild constipation. Almon Hercules, MD  Active   senna-docusate (SENOKOT-S) 8.6-50 MG tablet [id number]  Take 1 tablet by mouth 2 (two) times daily between meals as needed for mild constipation. Almon Hercules, MD  Active   Med List Note Deloria Lair, Durojahye' Elvera Lennox, CPhT 04/10/23 2332): [facility] [phone]              Assessment/Plan:   Anticoagulation for DVT treatment:  - reviewed her 2025 [employer] plan [phone]) - Her Eliquis is tier 3 - Copay is 25% of medication cost or per patient $150.  She endorses that she was given 3 weeks of samples and will receive her Social Security check next week and plan to purchase the remaining Eliquis to last unitl 07/11/2023.  - Xarelto would also be similar cost as Eliquis since it is also tier 3 - Screened for Medicare Extra Help / [name] - patient's income was above program cut off.  - Recommended that she would ask to see if office had any samples when she is in the office tomorrow. Patietn states she would prefer not to take all the office samples and that she could afford to purchase one month of Eliquis.  - Please contact me if additional therapy is needed beyond 07/11/2023.    Henrene Pastor, PharmD Clinical Pharmacist [facility]  Population Health [phone]

## 2023-06-24 ENCOUNTER — Other Ambulatory Visit: Payer: Self-pay

## 2023-06-24 ENCOUNTER — Other Ambulatory Visit (HOSPITAL_COMMUNITY): Payer: Self-pay

## 2023-06-24 ENCOUNTER — Ambulatory Visit (INDEPENDENT_AMBULATORY_CARE_PROVIDER_SITE_OTHER): Payer: Medicare HMO | Admitting: Family Medicine

## 2023-06-24 VITALS — BP 128/84 | HR 84 | Temp 94.1°F | Ht 61.0 in | Wt 200.6 lb

## 2023-06-24 DIAGNOSIS — Z1211 Encounter for screening for malignant neoplasm of colon: Secondary | ICD-10-CM | POA: Diagnosis not present

## 2023-06-24 DIAGNOSIS — R29898 Other symptoms and signs involving the musculoskeletal system: Secondary | ICD-10-CM

## 2023-06-24 DIAGNOSIS — K146 Glossodynia: Secondary | ICD-10-CM | POA: Diagnosis not present

## 2023-06-24 DIAGNOSIS — S31109A Unspecified open wound of abdominal wall, unspecified quadrant without penetration into peritoneal cavity, initial encounter: Secondary | ICD-10-CM

## 2023-06-24 DIAGNOSIS — L089 Local infection of the skin and subcutaneous tissue, unspecified: Secondary | ICD-10-CM

## 2023-06-24 LAB — VITAMIN B12: Vitamin B-12: 455 pg/mL (ref 211–911)

## 2023-06-24 MED ORDER — DOXYCYCLINE HYCLATE 100 MG PO TABS
100.0000 mg | ORAL_TABLET | Freq: Two times a day (BID) | ORAL | 0 refills | Status: AC
  Filled 2023-06-24: qty 14, 7d supply, fill #0

## 2023-06-24 NOTE — Patient Instructions (Addendum)
 [name] 2-3 business days to get the results of your labs back.   If negative, we will get you an oral solution to help with pain of the tongue.   If you do not hear anything about your referral in the next 1-2 weeks, call our office and ask for an update.  Someone will reach out regarding home health OT.   Let Christine [name] know if you need anything.  Hand Exercises Hand exercises can be helpful for almost anyone. These exercises can strengthen the hands, improve flexibility and movement, and increase blood flow to the hands. These results can make work and daily tasks easier. Hand exercises can be especially helpful for people who have joint pain from arthritis or have nerve damage from overuse (carpal tunnel syndrome). These exercises can also help people who have injured a hand. Exercises Most of these hand exercises are gentle stretching and motion exercises. It is usually safe to do them often throughout the day. Warming up your hands before exercise may help to reduce stiffness. You can do this with gentle massage or by placing your hands in warm water for 10-15 minutes. It is normal to feel some stretching, pulling, tightness, or mild discomfort as you begin new exercises. This will gradually improve. Stop an exercise right away if you feel sudden, severe pain or your pain gets worse. Ask your health care provider which exercises are best for you. Knuckle bend or "claw" fist Stand or sit with your arm, hand, and all five fingers pointed straight up. Make sure to keep your wrist straight during the exercise. Gently bend your fingers down toward your palm until the tips of your fingers are touching the top of your palm. Keep your big knuckle straight and just bend the small knuckles in your fingers. Hold this position for 3 seconds. Straighten (extend) your fingers back to the starting position. Repeat this exercise 5-10 times with each hand. Full finger fist Stand or sit with your arm, hand, and  all five fingers pointed straight up. Make sure to keep your wrist straight during the exercise. Gently bend your fingers into your palm until the tips of your fingers are touching the middle of your palm. Hold this position for 3 seconds. Extend your fingers back to the starting position, stretching every joint fully. Repeat this exercise 5-10 times with each hand. Straight fist Stand or sit with your arm, hand, and all five fingers pointed straight up. Make sure to keep your wrist straight during the exercise. Gently bend your fingers at the big knuckle, where your fingers meet your hand, and the middle knuckle. Keep the knuckle at the tips of your fingers straight and try to touch the bottom of your palm. Hold this position for 3 seconds. Extend your fingers back to the starting position, stretching every joint fully. Repeat this exercise 5-10 times with each hand. Tabletop Stand or sit with your arm, hand, and all five fingers pointed straight up. Make sure to keep your wrist straight during the exercise. Gently bend your fingers at the big knuckle, where your fingers meet your hand, as far down as you can while keeping the small knuckles in your fingers straight. Think of forming a tabletop with your fingers. Hold this position for 3 seconds. Extend your fingers back to the starting position, stretching every joint fully. Repeat this exercise 5-10 times with each hand. Finger spread Place your hand flat on a table with your palm facing down. Make sure your wrist  stays straight as you do this exercise. Spread your fingers and thumb apart from each other as far as you can until you feel a gentle stretch. Hold this position for 3 seconds. Bring your fingers and thumb tight together again. Hold this position for 3 seconds. Repeat this exercise 5-10 times with each hand. Making circles Stand or sit with your arm, hand, and all five fingers pointed straight up. Make sure to keep your wrist  straight during the exercise. Make a circle by touching the tip of your thumb to the tip of your index finger. Hold for 3 seconds. Then open your hand wide. Repeat this motion with your thumb and each finger on your hand. Repeat this exercise 5-10 times with each hand. Thumb motion Sit with your forearm resting on a table and your wrist straight. Your thumb should be facing up toward the ceiling. Keep your fingers relaxed as you move your thumb. Lift your thumb up as high as you can toward the ceiling. Hold for 3 seconds. Bend your thumb across your palm as far as you can, reaching the tip of your thumb for the small finger (pinkie) side of your palm. Hold for 3 seconds. Repeat this exercise 5-10 times with each hand. Grip strengthening  Hold a stress ball or other soft ball in the middle of your hand. Slowly increase the pressure, squeezing the ball as much as you can without causing pain. Think of bringing the tips of your fingers into the middle of your palm. All of your finger joints should bend when doing this exercise. Hold your squeeze for 3 seconds, then relax. Repeat this exercise 5-10 times with each hand. Contact a health care provider if: Your hand pain or discomfort gets much worse when you do an exercise. Your hand pain or discomfort does not improve within 2 hours after you exercise. If you have any of these problems, stop doing these exercises right away. Do not do them again unless your health care provider says that you can. Get help right away if: You develop sudden, severe hand pain or swelling. If this happens, stop doing these exercises right away. Do not do them again unless your health care provider says that you can. Make sure you discuss any questions you have with your health care provider. Document Revised: 08/03/2018 Document Reviewed: 04/13/2018 Elsevier Patient Education  2020 [employer].

## 2023-06-24 NOTE — Progress Notes (Signed)
 Chief Complaint  Patient presents with   Follow-up    Patient presents today for follow-up from 06/21/23 video visit. She reports still some drainage and a little pain.    Christine [name] is a 73 y.o. female here for a skin complaint.  Duration: several days Location: Bellybutton Pruritic? No Painful? No Drainage? Yes-milky brownish discharge without malodor Other associated symptoms: No fevers, redness Therapies tried thus far: None  Patient is recovering from a surgery she had late last year.  Home nursing is coming down to her place routinely for dressing changes.  She reports that is healing nicely but would like me to take a look.  She has a several month history of right hand weakness with intermittent pain.  No injury or change in activity.  It did start after a gout flare.  She was working with occupational therapy for her upper extremities.  Grip strength is decreased.  She is also had burning of her tongue intermittently for the past several months.  It is worsened over the past 2 weeks.  No inciting event or triggering factors.  She has not tried anything at home so far.  She has not noticed anything on her tongue.  Past Medical History:  Diagnosis Date   Allergy    dust, pollen, sulfa, prednisone   Anemia    Anxiety    Arthritis    "knees" (04/26/2016)   Colon polyps    benign per pt   Coronary artery disease    mild per 2015 cath in [place] (OM1 30%, RCA 30%)   GERD (gastroesophageal reflux disease)    Gout    History of hiatal hernia    Hyperlipidemia 05/20/2021   Hypertension    Migraine    "none since early /2017" (05/06/2016)   Obesity    OSA on CPAP    uses CPAP   Pre-diabetes    Pre-operative cardiovascular examination 08/22/2008   Renal insufficiency 11/09/2022   Vasculitis (HCC)    Bilateral    BP 128/84   Pulse 84   Temp (!) 94.1 F (34.5 C)   Ht 5\' 1"  (1.549 m)   Wt 200 lb 9.6 oz (91 kg)   SpO2 99%   BMI 37.90 kg/m  Gen: awake,  alert, appearing stated age Mouth: MMM, no lesions noted on her tongue Lungs: No accessory muscle use Skin: She was examined in the presence of a female chaperone.  See below. No excessive warmth. In the right upper portion of the umbilicus, there is a circular mass without TTP, erythema, excessive warmth.  Mild fluctuance with milky white discharge through an opening in the umbilicus.  It is not at the floor of the umbilicus though.  No foul odor appreciated.   Neuro: Grip strength 4/5 on the right compared to the left. MSK: No TTP over the right hand tendons, joints, no edema, no erythema or ecchymosis Psych: Age appropriate judgment and insight    Wound of abdomen - Plan: AMB referral to wound care center  Hand weakness - Plan: Ambulatory referral to Home Health  Tongue burning sensation - Plan: B12  Screen for colon cancer - Plan: Ambulatory referral to Gastroenterology  Skin infection - Plan: doxycycline (VIBRA-TABS) 100 MG tablet  We will set her up with the wound care team.  Home nursing coming by to change her dressings.  No obvious signs of infection today. Hand stretches and exercises given today.  No obvious pain on my examination.  We will also  get her home health team to start providing occupational therapy as well. Check a B12.  If negative, I will send in viscous lidocaine.  She was instructed not to swallow this. Colon cancer screening as above. The area in the umbilicus region is possibly a cyst versus a fistula.  Will start her treatment with for an infected cyst and plan to get a CT abdomen with contrast if no improvement. The patient voiced understanding and agreement to the plan.  I spent 41 minutes with the patient discussing the above plans in addition to reviewing her chart on the same day of the visit.  Jilda Roche [place], DO 06/24/23 12:42 PM

## 2023-06-25 ENCOUNTER — Other Ambulatory Visit: Payer: Self-pay | Admitting: Family Medicine

## 2023-06-25 ENCOUNTER — Other Ambulatory Visit (HOSPITAL_COMMUNITY): Payer: Self-pay

## 2023-06-25 ENCOUNTER — Encounter: Payer: Self-pay | Admitting: Family Medicine

## 2023-06-25 MED ORDER — LIDOCAINE VISCOUS HCL 2 % MT SOLN
5.0000 mL | Freq: Four times a day (QID) | OROMUCOSAL | 0 refills | Status: DC | PRN
  Filled 2023-06-25: qty 200, 5d supply, fill #0

## 2023-06-27 ENCOUNTER — Other Ambulatory Visit: Payer: Self-pay

## 2023-06-27 ENCOUNTER — Other Ambulatory Visit (HOSPITAL_COMMUNITY): Payer: Self-pay

## 2023-06-28 DIAGNOSIS — K289 Gastrojejunal ulcer, unspecified as acute or chronic, without hemorrhage or perforation: Secondary | ICD-10-CM | POA: Diagnosis not present

## 2023-06-28 DIAGNOSIS — I119 Hypertensive heart disease without heart failure: Secondary | ICD-10-CM | POA: Diagnosis not present

## 2023-06-28 DIAGNOSIS — G4733 Obstructive sleep apnea (adult) (pediatric): Secondary | ICD-10-CM | POA: Diagnosis not present

## 2023-06-28 DIAGNOSIS — I700 Atherosclerosis of aorta: Secondary | ICD-10-CM | POA: Diagnosis not present

## 2023-06-28 DIAGNOSIS — F411 Generalized anxiety disorder: Secondary | ICD-10-CM | POA: Diagnosis not present

## 2023-06-28 DIAGNOSIS — K828 Other specified diseases of gallbladder: Secondary | ICD-10-CM | POA: Diagnosis not present

## 2023-06-28 DIAGNOSIS — D649 Anemia, unspecified: Secondary | ICD-10-CM | POA: Diagnosis not present

## 2023-06-28 DIAGNOSIS — I959 Hypotension, unspecified: Secondary | ICD-10-CM | POA: Diagnosis not present

## 2023-06-28 DIAGNOSIS — I2510 Atherosclerotic heart disease of native coronary artery without angina pectoris: Secondary | ICD-10-CM | POA: Diagnosis not present

## 2023-06-28 DIAGNOSIS — M109 Gout, unspecified: Secondary | ICD-10-CM | POA: Diagnosis not present

## 2023-06-28 DIAGNOSIS — K2970 Gastritis, unspecified, without bleeding: Secondary | ICD-10-CM | POA: Diagnosis not present

## 2023-06-28 DIAGNOSIS — E8809 Other disorders of plasma-protein metabolism, not elsewhere classified: Secondary | ICD-10-CM | POA: Diagnosis not present

## 2023-06-28 DIAGNOSIS — M5416 Radiculopathy, lumbar region: Secondary | ICD-10-CM | POA: Diagnosis not present

## 2023-06-28 DIAGNOSIS — Z48815 Encounter for surgical aftercare following surgery on the digestive system: Secondary | ICD-10-CM | POA: Diagnosis not present

## 2023-06-28 DIAGNOSIS — M179 Osteoarthritis of knee, unspecified: Secondary | ICD-10-CM | POA: Diagnosis not present

## 2023-06-28 DIAGNOSIS — N3946 Mixed incontinence: Secondary | ICD-10-CM | POA: Diagnosis not present

## 2023-06-28 DIAGNOSIS — Z431 Encounter for attention to gastrostomy: Secondary | ICD-10-CM | POA: Diagnosis not present

## 2023-06-28 DIAGNOSIS — K760 Fatty (change of) liver, not elsewhere classified: Secondary | ICD-10-CM | POA: Diagnosis not present

## 2023-06-28 DIAGNOSIS — E785 Hyperlipidemia, unspecified: Secondary | ICD-10-CM | POA: Diagnosis not present

## 2023-06-28 DIAGNOSIS — K224 Dyskinesia of esophagus: Secondary | ICD-10-CM | POA: Diagnosis not present

## 2023-06-28 DIAGNOSIS — N289 Disorder of kidney and ureter, unspecified: Secondary | ICD-10-CM | POA: Diagnosis not present

## 2023-06-28 DIAGNOSIS — R7303 Prediabetes: Secondary | ICD-10-CM | POA: Diagnosis not present

## 2023-06-28 DIAGNOSIS — R627 Adult failure to thrive: Secondary | ICD-10-CM | POA: Diagnosis not present

## 2023-06-28 DIAGNOSIS — K219 Gastro-esophageal reflux disease without esophagitis: Secondary | ICD-10-CM | POA: Diagnosis not present

## 2023-06-29 ENCOUNTER — Other Ambulatory Visit: Payer: Self-pay

## 2023-06-29 ENCOUNTER — Other Ambulatory Visit (HOSPITAL_COMMUNITY): Payer: Self-pay

## 2023-06-29 ENCOUNTER — Encounter: Payer: Self-pay | Admitting: Family Medicine

## 2023-06-29 ENCOUNTER — Telehealth: Payer: Self-pay

## 2023-06-29 DIAGNOSIS — R42 Dizziness and giddiness: Secondary | ICD-10-CM

## 2023-06-29 MED ORDER — MECLIZINE HCL 12.5 MG PO TABS
12.5000 mg | ORAL_TABLET | Freq: Three times a day (TID) | ORAL | 1 refills | Status: AC | PRN
  Filled 2023-06-29: qty 30, 10d supply, fill #0

## 2023-06-29 NOTE — Telephone Encounter (Signed)
 Patient requested a refill on Meclizine 12.5mg  and medication send into pharmacy.

## 2023-07-01 DIAGNOSIS — K219 Gastro-esophageal reflux disease without esophagitis: Secondary | ICD-10-CM | POA: Diagnosis not present

## 2023-07-01 DIAGNOSIS — I700 Atherosclerosis of aorta: Secondary | ICD-10-CM | POA: Diagnosis not present

## 2023-07-01 DIAGNOSIS — R7303 Prediabetes: Secondary | ICD-10-CM | POA: Diagnosis not present

## 2023-07-01 DIAGNOSIS — E8809 Other disorders of plasma-protein metabolism, not elsewhere classified: Secondary | ICD-10-CM | POA: Diagnosis not present

## 2023-07-01 DIAGNOSIS — K760 Fatty (change of) liver, not elsewhere classified: Secondary | ICD-10-CM | POA: Diagnosis not present

## 2023-07-01 DIAGNOSIS — I2510 Atherosclerotic heart disease of native coronary artery without angina pectoris: Secondary | ICD-10-CM | POA: Diagnosis not present

## 2023-07-01 DIAGNOSIS — M109 Gout, unspecified: Secondary | ICD-10-CM | POA: Diagnosis not present

## 2023-07-01 DIAGNOSIS — E785 Hyperlipidemia, unspecified: Secondary | ICD-10-CM | POA: Diagnosis not present

## 2023-07-01 DIAGNOSIS — N289 Disorder of kidney and ureter, unspecified: Secondary | ICD-10-CM | POA: Diagnosis not present

## 2023-07-01 DIAGNOSIS — R627 Adult failure to thrive: Secondary | ICD-10-CM | POA: Diagnosis not present

## 2023-07-01 DIAGNOSIS — M5416 Radiculopathy, lumbar region: Secondary | ICD-10-CM | POA: Diagnosis not present

## 2023-07-01 DIAGNOSIS — I119 Hypertensive heart disease without heart failure: Secondary | ICD-10-CM | POA: Diagnosis not present

## 2023-07-01 DIAGNOSIS — D649 Anemia, unspecified: Secondary | ICD-10-CM | POA: Diagnosis not present

## 2023-07-01 DIAGNOSIS — N3946 Mixed incontinence: Secondary | ICD-10-CM | POA: Diagnosis not present

## 2023-07-01 DIAGNOSIS — K828 Other specified diseases of gallbladder: Secondary | ICD-10-CM | POA: Diagnosis not present

## 2023-07-01 DIAGNOSIS — Z48815 Encounter for surgical aftercare following surgery on the digestive system: Secondary | ICD-10-CM | POA: Diagnosis not present

## 2023-07-01 DIAGNOSIS — K289 Gastrojejunal ulcer, unspecified as acute or chronic, without hemorrhage or perforation: Secondary | ICD-10-CM | POA: Diagnosis not present

## 2023-07-01 DIAGNOSIS — K2970 Gastritis, unspecified, without bleeding: Secondary | ICD-10-CM | POA: Diagnosis not present

## 2023-07-01 DIAGNOSIS — M179 Osteoarthritis of knee, unspecified: Secondary | ICD-10-CM | POA: Diagnosis not present

## 2023-07-01 DIAGNOSIS — Z431 Encounter for attention to gastrostomy: Secondary | ICD-10-CM | POA: Diagnosis not present

## 2023-07-01 DIAGNOSIS — F411 Generalized anxiety disorder: Secondary | ICD-10-CM | POA: Diagnosis not present

## 2023-07-01 DIAGNOSIS — G4733 Obstructive sleep apnea (adult) (pediatric): Secondary | ICD-10-CM | POA: Diagnosis not present

## 2023-07-01 DIAGNOSIS — I959 Hypotension, unspecified: Secondary | ICD-10-CM | POA: Diagnosis not present

## 2023-07-01 DIAGNOSIS — K224 Dyskinesia of esophagus: Secondary | ICD-10-CM | POA: Diagnosis not present

## 2023-07-05 ENCOUNTER — Encounter: Payer: Self-pay | Admitting: Family Medicine

## 2023-07-05 DIAGNOSIS — I700 Atherosclerosis of aorta: Secondary | ICD-10-CM | POA: Diagnosis not present

## 2023-07-05 DIAGNOSIS — G4733 Obstructive sleep apnea (adult) (pediatric): Secondary | ICD-10-CM | POA: Diagnosis not present

## 2023-07-05 DIAGNOSIS — I2510 Atherosclerotic heart disease of native coronary artery without angina pectoris: Secondary | ICD-10-CM | POA: Diagnosis not present

## 2023-07-05 DIAGNOSIS — R627 Adult failure to thrive: Secondary | ICD-10-CM | POA: Diagnosis not present

## 2023-07-05 DIAGNOSIS — D649 Anemia, unspecified: Secondary | ICD-10-CM | POA: Diagnosis not present

## 2023-07-05 DIAGNOSIS — K224 Dyskinesia of esophagus: Secondary | ICD-10-CM | POA: Diagnosis not present

## 2023-07-05 DIAGNOSIS — Z431 Encounter for attention to gastrostomy: Secondary | ICD-10-CM | POA: Diagnosis not present

## 2023-07-05 DIAGNOSIS — N289 Disorder of kidney and ureter, unspecified: Secondary | ICD-10-CM | POA: Diagnosis not present

## 2023-07-05 DIAGNOSIS — K760 Fatty (change of) liver, not elsewhere classified: Secondary | ICD-10-CM | POA: Diagnosis not present

## 2023-07-05 DIAGNOSIS — N3946 Mixed incontinence: Secondary | ICD-10-CM | POA: Diagnosis not present

## 2023-07-05 DIAGNOSIS — K289 Gastrojejunal ulcer, unspecified as acute or chronic, without hemorrhage or perforation: Secondary | ICD-10-CM | POA: Diagnosis not present

## 2023-07-05 DIAGNOSIS — E785 Hyperlipidemia, unspecified: Secondary | ICD-10-CM | POA: Diagnosis not present

## 2023-07-05 DIAGNOSIS — K2970 Gastritis, unspecified, without bleeding: Secondary | ICD-10-CM | POA: Diagnosis not present

## 2023-07-05 DIAGNOSIS — M5416 Radiculopathy, lumbar region: Secondary | ICD-10-CM | POA: Diagnosis not present

## 2023-07-05 DIAGNOSIS — I959 Hypotension, unspecified: Secondary | ICD-10-CM | POA: Diagnosis not present

## 2023-07-05 DIAGNOSIS — K828 Other specified diseases of gallbladder: Secondary | ICD-10-CM | POA: Diagnosis not present

## 2023-07-05 DIAGNOSIS — Z48815 Encounter for surgical aftercare following surgery on the digestive system: Secondary | ICD-10-CM | POA: Diagnosis not present

## 2023-07-05 DIAGNOSIS — M109 Gout, unspecified: Secondary | ICD-10-CM | POA: Diagnosis not present

## 2023-07-05 DIAGNOSIS — M179 Osteoarthritis of knee, unspecified: Secondary | ICD-10-CM | POA: Diagnosis not present

## 2023-07-05 DIAGNOSIS — K219 Gastro-esophageal reflux disease without esophagitis: Secondary | ICD-10-CM | POA: Diagnosis not present

## 2023-07-05 DIAGNOSIS — F411 Generalized anxiety disorder: Secondary | ICD-10-CM | POA: Diagnosis not present

## 2023-07-05 DIAGNOSIS — I119 Hypertensive heart disease without heart failure: Secondary | ICD-10-CM | POA: Diagnosis not present

## 2023-07-05 DIAGNOSIS — R7303 Prediabetes: Secondary | ICD-10-CM | POA: Diagnosis not present

## 2023-07-05 DIAGNOSIS — E8809 Other disorders of plasma-protein metabolism, not elsewhere classified: Secondary | ICD-10-CM | POA: Diagnosis not present

## 2023-07-06 DIAGNOSIS — K760 Fatty (change of) liver, not elsewhere classified: Secondary | ICD-10-CM | POA: Diagnosis not present

## 2023-07-06 DIAGNOSIS — I700 Atherosclerosis of aorta: Secondary | ICD-10-CM | POA: Diagnosis not present

## 2023-07-06 DIAGNOSIS — F411 Generalized anxiety disorder: Secondary | ICD-10-CM | POA: Diagnosis not present

## 2023-07-06 DIAGNOSIS — Z431 Encounter for attention to gastrostomy: Secondary | ICD-10-CM | POA: Diagnosis not present

## 2023-07-06 DIAGNOSIS — R7303 Prediabetes: Secondary | ICD-10-CM | POA: Diagnosis not present

## 2023-07-06 DIAGNOSIS — R627 Adult failure to thrive: Secondary | ICD-10-CM | POA: Diagnosis not present

## 2023-07-06 DIAGNOSIS — I119 Hypertensive heart disease without heart failure: Secondary | ICD-10-CM | POA: Diagnosis not present

## 2023-07-06 DIAGNOSIS — K2970 Gastritis, unspecified, without bleeding: Secondary | ICD-10-CM | POA: Diagnosis not present

## 2023-07-06 DIAGNOSIS — K828 Other specified diseases of gallbladder: Secondary | ICD-10-CM | POA: Diagnosis not present

## 2023-07-06 DIAGNOSIS — N289 Disorder of kidney and ureter, unspecified: Secondary | ICD-10-CM | POA: Diagnosis not present

## 2023-07-06 DIAGNOSIS — N3946 Mixed incontinence: Secondary | ICD-10-CM | POA: Diagnosis not present

## 2023-07-06 DIAGNOSIS — I2510 Atherosclerotic heart disease of native coronary artery without angina pectoris: Secondary | ICD-10-CM | POA: Diagnosis not present

## 2023-07-06 DIAGNOSIS — M109 Gout, unspecified: Secondary | ICD-10-CM | POA: Diagnosis not present

## 2023-07-06 DIAGNOSIS — K224 Dyskinesia of esophagus: Secondary | ICD-10-CM | POA: Diagnosis not present

## 2023-07-06 DIAGNOSIS — I959 Hypotension, unspecified: Secondary | ICD-10-CM | POA: Diagnosis not present

## 2023-07-06 DIAGNOSIS — E8809 Other disorders of plasma-protein metabolism, not elsewhere classified: Secondary | ICD-10-CM | POA: Diagnosis not present

## 2023-07-06 DIAGNOSIS — E785 Hyperlipidemia, unspecified: Secondary | ICD-10-CM | POA: Diagnosis not present

## 2023-07-06 DIAGNOSIS — D649 Anemia, unspecified: Secondary | ICD-10-CM | POA: Diagnosis not present

## 2023-07-06 DIAGNOSIS — K219 Gastro-esophageal reflux disease without esophagitis: Secondary | ICD-10-CM | POA: Diagnosis not present

## 2023-07-06 DIAGNOSIS — M5416 Radiculopathy, lumbar region: Secondary | ICD-10-CM | POA: Diagnosis not present

## 2023-07-06 DIAGNOSIS — K289 Gastrojejunal ulcer, unspecified as acute or chronic, without hemorrhage or perforation: Secondary | ICD-10-CM | POA: Diagnosis not present

## 2023-07-06 DIAGNOSIS — G4733 Obstructive sleep apnea (adult) (pediatric): Secondary | ICD-10-CM | POA: Diagnosis not present

## 2023-07-06 DIAGNOSIS — Z48815 Encounter for surgical aftercare following surgery on the digestive system: Secondary | ICD-10-CM | POA: Diagnosis not present

## 2023-07-06 DIAGNOSIS — M179 Osteoarthritis of knee, unspecified: Secondary | ICD-10-CM | POA: Diagnosis not present

## 2023-07-08 ENCOUNTER — Ambulatory Visit: Payer: Medicare HMO | Admitting: Family Medicine

## 2023-07-11 ENCOUNTER — Encounter: Payer: Self-pay | Admitting: Family Medicine

## 2023-07-11 ENCOUNTER — Ambulatory Visit (INDEPENDENT_AMBULATORY_CARE_PROVIDER_SITE_OTHER): Payer: Medicare HMO | Admitting: Family Medicine

## 2023-07-11 VITALS — BP 110/76 | HR 93 | Resp 18 | Ht 61.0 in | Wt 191.0 lb

## 2023-07-11 DIAGNOSIS — R634 Abnormal weight loss: Secondary | ICD-10-CM

## 2023-07-11 DIAGNOSIS — K289 Gastrojejunal ulcer, unspecified as acute or chronic, without hemorrhage or perforation: Secondary | ICD-10-CM

## 2023-07-11 DIAGNOSIS — R55 Syncope and collapse: Secondary | ICD-10-CM | POA: Diagnosis not present

## 2023-07-11 DIAGNOSIS — H532 Diplopia: Secondary | ICD-10-CM | POA: Diagnosis not present

## 2023-07-11 NOTE — Progress Notes (Signed)
 Chief Complaint  Patient presents with   Medication Refill    Subjective: Patient is a 73 y.o. female here for syncope.  2 d ago, pt was reaching in the freezer and suddenly lost consciousness. She was out for a few minutes. She reports slight confusion, it took her a minute or so to regain composure. She did not have any issues leading up to the fall. She did not bite her tongue or lose control of her bowel/bladder function. No pain. She has not been eating/drinking as much lately. She has lost 10 more lbs in the past 2.5 weeks. This has been going on since most recent GI procedure in Nov, 2024. No SOB, diarrhea, bleeding, fevers.   Also 2 days ago she had double vision for short period of time.  Lasted for several seconds before normalizing.  She felt like a curtain was pulled out over her eyes.  No current issues.  There is no eye pain.  She did fall multiple times while in the skilled nursing facility.  She wonders if there is lasting damage from that.  The patient continues to have difficulty keeping down adequate amounts of fluid and food.  This was the case since her exploratory laparotomy on 03/21/2023 while hospitalized.  She unfortunately did not follow-up with her surgeon.  Past Medical History:  Diagnosis Date   Allergy    dust, pollen, sulfa, prednisone   Anemia    Anxiety    Arthritis    "knees" (04/26/2016)   Colon polyps    benign per pt   Coronary artery disease    mild per 2015 cath in [place] (OM1 30%, RCA 30%)   GERD (gastroesophageal reflux disease)    Gout    History of hiatal hernia    Hyperlipidemia 05/20/2021   Hypertension    Migraine    "none since early /2017" (05/06/2016)   Obesity    OSA on CPAP    uses CPAP   Pre-diabetes    Pre-operative cardiovascular examination 08/22/2008   Renal insufficiency 11/09/2022   Vasculitis (HCC)    Bilateral    Objective: BP 110/76   Pulse 93   Resp 18   Ht 5\' 1"  (1.549 m)   Wt 191 lb (86.6 kg)   SpO2 98%    BMI 36.09 kg/m  General: Awake, appears stated age Mouth: MMD, no pharyngeal exudate or erythema Eyes: PERRLA, sclera white, EOMI Heart: RRR, 1+ pitting bilateral LE edema tapering at the knees Neuro: No cerebellar signs, speech is fluent and goal oriented, no facial droop Abdomen: Bowel sounds present, soft, nontender, nondistended Lungs: CTAB, no rales, wheezes or rhonchi. No accessory muscle use Psych: Age appropriate judgment and insight, normal affect and mood  Assessment and Plan: Double vision - Plan: MR Brain Wo Contrast  Syncope, unspecified syncope type - Plan: MR Brain Wo Contrast, CBC, Comprehensive metabolic panel  Unintentional weight loss - Plan: Ambulatory referral to General Surgery  Marginal ulcer - Plan: Ambulatory referral to General Surgery  Given the episode was over 2 days ago, will perform outpatient stroke workup starting with an MRI brain.  If this happens again, consider going to the ER. MRI as above.  Check labs.  Does not seem cardiogenic in nature but could check echo and long-term heart monitor depending on how her results are.  She should increase hydration. Refer back to Dr. Sophronia Simas of the general surgery team to see if any of these effects are related to the procedure.  Could consider GI referral if nothing more to be done on the general surgery side. She had a perforated marginal ulcer. The patient voiced understanding and agreement to the plan.  I spent 43 minutes with the patient discussing the above plans in addition to reviewing her chart on the same day of the visit.  Jilda Roche [place], DO 07/11/23  2:20 PM

## 2023-07-11 NOTE — Patient Instructions (Signed)
 Someone will reach out to schedule your MRI.   If you fall again, go to ER.  Give [place] 2-3 business days to get the results of your labs back.   Try to drink 55-60 oz of water daily outside of exercise.  Let us know if you need anything.

## 2023-07-12 ENCOUNTER — Encounter: Payer: Self-pay | Admitting: Family Medicine

## 2023-07-12 DIAGNOSIS — I700 Atherosclerosis of aorta: Secondary | ICD-10-CM | POA: Diagnosis not present

## 2023-07-12 DIAGNOSIS — D649 Anemia, unspecified: Secondary | ICD-10-CM | POA: Diagnosis not present

## 2023-07-12 DIAGNOSIS — I119 Hypertensive heart disease without heart failure: Secondary | ICD-10-CM | POA: Diagnosis not present

## 2023-07-12 DIAGNOSIS — K760 Fatty (change of) liver, not elsewhere classified: Secondary | ICD-10-CM | POA: Diagnosis not present

## 2023-07-12 DIAGNOSIS — Z431 Encounter for attention to gastrostomy: Secondary | ICD-10-CM | POA: Diagnosis not present

## 2023-07-12 DIAGNOSIS — N3946 Mixed incontinence: Secondary | ICD-10-CM | POA: Diagnosis not present

## 2023-07-12 DIAGNOSIS — I2510 Atherosclerotic heart disease of native coronary artery without angina pectoris: Secondary | ICD-10-CM | POA: Diagnosis not present

## 2023-07-12 DIAGNOSIS — K828 Other specified diseases of gallbladder: Secondary | ICD-10-CM | POA: Diagnosis not present

## 2023-07-12 DIAGNOSIS — E8809 Other disorders of plasma-protein metabolism, not elsewhere classified: Secondary | ICD-10-CM | POA: Diagnosis not present

## 2023-07-12 DIAGNOSIS — I959 Hypotension, unspecified: Secondary | ICD-10-CM | POA: Diagnosis not present

## 2023-07-12 DIAGNOSIS — K219 Gastro-esophageal reflux disease without esophagitis: Secondary | ICD-10-CM | POA: Diagnosis not present

## 2023-07-12 DIAGNOSIS — M5416 Radiculopathy, lumbar region: Secondary | ICD-10-CM | POA: Diagnosis not present

## 2023-07-12 DIAGNOSIS — F411 Generalized anxiety disorder: Secondary | ICD-10-CM | POA: Diagnosis not present

## 2023-07-12 DIAGNOSIS — E785 Hyperlipidemia, unspecified: Secondary | ICD-10-CM | POA: Diagnosis not present

## 2023-07-12 DIAGNOSIS — K224 Dyskinesia of esophagus: Secondary | ICD-10-CM | POA: Diagnosis not present

## 2023-07-12 DIAGNOSIS — K289 Gastrojejunal ulcer, unspecified as acute or chronic, without hemorrhage or perforation: Secondary | ICD-10-CM | POA: Diagnosis not present

## 2023-07-12 DIAGNOSIS — Z48815 Encounter for surgical aftercare following surgery on the digestive system: Secondary | ICD-10-CM | POA: Diagnosis not present

## 2023-07-12 DIAGNOSIS — M109 Gout, unspecified: Secondary | ICD-10-CM | POA: Diagnosis not present

## 2023-07-12 DIAGNOSIS — R7303 Prediabetes: Secondary | ICD-10-CM | POA: Diagnosis not present

## 2023-07-12 DIAGNOSIS — R627 Adult failure to thrive: Secondary | ICD-10-CM | POA: Diagnosis not present

## 2023-07-12 DIAGNOSIS — K2970 Gastritis, unspecified, without bleeding: Secondary | ICD-10-CM | POA: Diagnosis not present

## 2023-07-12 DIAGNOSIS — M179 Osteoarthritis of knee, unspecified: Secondary | ICD-10-CM | POA: Diagnosis not present

## 2023-07-12 DIAGNOSIS — N289 Disorder of kidney and ureter, unspecified: Secondary | ICD-10-CM | POA: Diagnosis not present

## 2023-07-12 DIAGNOSIS — G4733 Obstructive sleep apnea (adult) (pediatric): Secondary | ICD-10-CM | POA: Diagnosis not present

## 2023-07-12 LAB — COMPREHENSIVE METABOLIC PANEL
ALT: 8 U/L (ref 0–35)
AST: 26 U/L (ref 0–37)
Albumin: 2.8 g/dL — ABNORMAL LOW (ref 3.5–5.2)
Alkaline Phosphatase: 84 U/L (ref 39–117)
BUN: 21 mg/dL (ref 6–23)
CO2: 23 meq/L (ref 19–32)
Calcium: 9.1 mg/dL (ref 8.4–10.5)
Chloride: 110 meq/L (ref 96–112)
Creatinine, Ser: 0.95 mg/dL (ref 0.40–1.20)
GFR: 60.77 mL/min (ref >60.00)
Glucose, Bld: 89 mg/dL (ref 70–99)
Potassium: 3.9 meq/L (ref 3.5–5.1)
Sodium: 144 meq/L (ref 135–145)
Total Bilirubin: 0.7 mg/dL (ref 0.2–1.2)
Total Protein: 6 g/dL (ref 6.0–8.3)

## 2023-07-12 LAB — CBC
HCT: 34.1 % — ABNORMAL LOW (ref 36.0–46.0)
Hemoglobin: 11 g/dL — ABNORMAL LOW (ref 12.0–15.0)
MCHC: 32.4 g/dL (ref 30.0–36.0)
MCV: 93.5 fl (ref 78.0–100.0)
Platelets: 289 10*3/uL (ref 150.0–400.0)
RBC: 3.65 Mil/uL — ABNORMAL LOW (ref 3.87–5.11)
RDW: 16.2 % — ABNORMAL HIGH (ref 11.5–15.5)
WBC: 3.1 10*3/uL — ABNORMAL LOW (ref 4.0–10.5)

## 2023-07-13 DIAGNOSIS — I2510 Atherosclerotic heart disease of native coronary artery without angina pectoris: Secondary | ICD-10-CM | POA: Diagnosis not present

## 2023-07-13 DIAGNOSIS — M179 Osteoarthritis of knee, unspecified: Secondary | ICD-10-CM | POA: Diagnosis not present

## 2023-07-13 DIAGNOSIS — M109 Gout, unspecified: Secondary | ICD-10-CM | POA: Diagnosis not present

## 2023-07-13 DIAGNOSIS — K760 Fatty (change of) liver, not elsewhere classified: Secondary | ICD-10-CM | POA: Diagnosis not present

## 2023-07-13 DIAGNOSIS — K2970 Gastritis, unspecified, without bleeding: Secondary | ICD-10-CM | POA: Diagnosis not present

## 2023-07-13 DIAGNOSIS — D649 Anemia, unspecified: Secondary | ICD-10-CM | POA: Diagnosis not present

## 2023-07-13 DIAGNOSIS — K828 Other specified diseases of gallbladder: Secondary | ICD-10-CM | POA: Diagnosis not present

## 2023-07-13 DIAGNOSIS — I959 Hypotension, unspecified: Secondary | ICD-10-CM | POA: Diagnosis not present

## 2023-07-13 DIAGNOSIS — R7303 Prediabetes: Secondary | ICD-10-CM | POA: Diagnosis not present

## 2023-07-13 DIAGNOSIS — F411 Generalized anxiety disorder: Secondary | ICD-10-CM | POA: Diagnosis not present

## 2023-07-13 DIAGNOSIS — R627 Adult failure to thrive: Secondary | ICD-10-CM | POA: Diagnosis not present

## 2023-07-13 DIAGNOSIS — G4733 Obstructive sleep apnea (adult) (pediatric): Secondary | ICD-10-CM | POA: Diagnosis not present

## 2023-07-13 DIAGNOSIS — E8809 Other disorders of plasma-protein metabolism, not elsewhere classified: Secondary | ICD-10-CM | POA: Diagnosis not present

## 2023-07-13 DIAGNOSIS — I119 Hypertensive heart disease without heart failure: Secondary | ICD-10-CM | POA: Diagnosis not present

## 2023-07-13 DIAGNOSIS — E785 Hyperlipidemia, unspecified: Secondary | ICD-10-CM | POA: Diagnosis not present

## 2023-07-13 DIAGNOSIS — Z48815 Encounter for surgical aftercare following surgery on the digestive system: Secondary | ICD-10-CM | POA: Diagnosis not present

## 2023-07-13 DIAGNOSIS — K224 Dyskinesia of esophagus: Secondary | ICD-10-CM | POA: Diagnosis not present

## 2023-07-13 DIAGNOSIS — M5416 Radiculopathy, lumbar region: Secondary | ICD-10-CM | POA: Diagnosis not present

## 2023-07-13 DIAGNOSIS — Z431 Encounter for attention to gastrostomy: Secondary | ICD-10-CM | POA: Diagnosis not present

## 2023-07-13 DIAGNOSIS — N289 Disorder of kidney and ureter, unspecified: Secondary | ICD-10-CM | POA: Diagnosis not present

## 2023-07-13 DIAGNOSIS — I700 Atherosclerosis of aorta: Secondary | ICD-10-CM | POA: Diagnosis not present

## 2023-07-13 DIAGNOSIS — K219 Gastro-esophageal reflux disease without esophagitis: Secondary | ICD-10-CM | POA: Diagnosis not present

## 2023-07-13 DIAGNOSIS — K289 Gastrojejunal ulcer, unspecified as acute or chronic, without hemorrhage or perforation: Secondary | ICD-10-CM | POA: Diagnosis not present

## 2023-07-13 DIAGNOSIS — N3946 Mixed incontinence: Secondary | ICD-10-CM | POA: Diagnosis not present

## 2023-07-14 DIAGNOSIS — I2510 Atherosclerotic heart disease of native coronary artery without angina pectoris: Secondary | ICD-10-CM | POA: Diagnosis not present

## 2023-07-14 DIAGNOSIS — M179 Osteoarthritis of knee, unspecified: Secondary | ICD-10-CM | POA: Diagnosis not present

## 2023-07-14 DIAGNOSIS — K289 Gastrojejunal ulcer, unspecified as acute or chronic, without hemorrhage or perforation: Secondary | ICD-10-CM | POA: Diagnosis not present

## 2023-07-14 DIAGNOSIS — M5416 Radiculopathy, lumbar region: Secondary | ICD-10-CM | POA: Diagnosis not present

## 2023-07-14 DIAGNOSIS — F411 Generalized anxiety disorder: Secondary | ICD-10-CM | POA: Diagnosis not present

## 2023-07-14 DIAGNOSIS — K219 Gastro-esophageal reflux disease without esophagitis: Secondary | ICD-10-CM | POA: Diagnosis not present

## 2023-07-14 DIAGNOSIS — D649 Anemia, unspecified: Secondary | ICD-10-CM | POA: Diagnosis not present

## 2023-07-14 DIAGNOSIS — Z48815 Encounter for surgical aftercare following surgery on the digestive system: Secondary | ICD-10-CM | POA: Diagnosis not present

## 2023-07-14 DIAGNOSIS — K224 Dyskinesia of esophagus: Secondary | ICD-10-CM | POA: Diagnosis not present

## 2023-07-14 DIAGNOSIS — K828 Other specified diseases of gallbladder: Secondary | ICD-10-CM | POA: Diagnosis not present

## 2023-07-14 DIAGNOSIS — K2970 Gastritis, unspecified, without bleeding: Secondary | ICD-10-CM | POA: Diagnosis not present

## 2023-07-14 DIAGNOSIS — N289 Disorder of kidney and ureter, unspecified: Secondary | ICD-10-CM | POA: Diagnosis not present

## 2023-07-14 DIAGNOSIS — E785 Hyperlipidemia, unspecified: Secondary | ICD-10-CM | POA: Diagnosis not present

## 2023-07-14 DIAGNOSIS — G4733 Obstructive sleep apnea (adult) (pediatric): Secondary | ICD-10-CM | POA: Diagnosis not present

## 2023-07-14 DIAGNOSIS — I959 Hypotension, unspecified: Secondary | ICD-10-CM | POA: Diagnosis not present

## 2023-07-14 DIAGNOSIS — M109 Gout, unspecified: Secondary | ICD-10-CM | POA: Diagnosis not present

## 2023-07-14 DIAGNOSIS — Z431 Encounter for attention to gastrostomy: Secondary | ICD-10-CM | POA: Diagnosis not present

## 2023-07-14 DIAGNOSIS — R7303 Prediabetes: Secondary | ICD-10-CM | POA: Diagnosis not present

## 2023-07-14 DIAGNOSIS — K760 Fatty (change of) liver, not elsewhere classified: Secondary | ICD-10-CM | POA: Diagnosis not present

## 2023-07-14 DIAGNOSIS — I119 Hypertensive heart disease without heart failure: Secondary | ICD-10-CM | POA: Diagnosis not present

## 2023-07-14 DIAGNOSIS — E8809 Other disorders of plasma-protein metabolism, not elsewhere classified: Secondary | ICD-10-CM | POA: Diagnosis not present

## 2023-07-14 DIAGNOSIS — N3946 Mixed incontinence: Secondary | ICD-10-CM | POA: Diagnosis not present

## 2023-07-14 DIAGNOSIS — I700 Atherosclerosis of aorta: Secondary | ICD-10-CM | POA: Diagnosis not present

## 2023-07-14 DIAGNOSIS — R627 Adult failure to thrive: Secondary | ICD-10-CM | POA: Diagnosis not present

## 2023-07-19 ENCOUNTER — Telehealth: Payer: Self-pay

## 2023-07-19 DIAGNOSIS — D649 Anemia, unspecified: Secondary | ICD-10-CM | POA: Diagnosis not present

## 2023-07-19 DIAGNOSIS — M179 Osteoarthritis of knee, unspecified: Secondary | ICD-10-CM | POA: Diagnosis not present

## 2023-07-19 DIAGNOSIS — K2970 Gastritis, unspecified, without bleeding: Secondary | ICD-10-CM | POA: Diagnosis not present

## 2023-07-19 DIAGNOSIS — I700 Atherosclerosis of aorta: Secondary | ICD-10-CM | POA: Diagnosis not present

## 2023-07-19 DIAGNOSIS — Z48815 Encounter for surgical aftercare following surgery on the digestive system: Secondary | ICD-10-CM | POA: Diagnosis not present

## 2023-07-19 DIAGNOSIS — K289 Gastrojejunal ulcer, unspecified as acute or chronic, without hemorrhage or perforation: Secondary | ICD-10-CM | POA: Diagnosis not present

## 2023-07-19 DIAGNOSIS — M5416 Radiculopathy, lumbar region: Secondary | ICD-10-CM | POA: Diagnosis not present

## 2023-07-19 DIAGNOSIS — N3946 Mixed incontinence: Secondary | ICD-10-CM | POA: Diagnosis not present

## 2023-07-19 DIAGNOSIS — E8809 Other disorders of plasma-protein metabolism, not elsewhere classified: Secondary | ICD-10-CM | POA: Diagnosis not present

## 2023-07-19 DIAGNOSIS — M109 Gout, unspecified: Secondary | ICD-10-CM | POA: Diagnosis not present

## 2023-07-19 DIAGNOSIS — R627 Adult failure to thrive: Secondary | ICD-10-CM | POA: Diagnosis not present

## 2023-07-19 DIAGNOSIS — G4733 Obstructive sleep apnea (adult) (pediatric): Secondary | ICD-10-CM | POA: Diagnosis not present

## 2023-07-19 DIAGNOSIS — Z431 Encounter for attention to gastrostomy: Secondary | ICD-10-CM | POA: Diagnosis not present

## 2023-07-19 DIAGNOSIS — I119 Hypertensive heart disease without heart failure: Secondary | ICD-10-CM | POA: Diagnosis not present

## 2023-07-19 DIAGNOSIS — K219 Gastro-esophageal reflux disease without esophagitis: Secondary | ICD-10-CM | POA: Diagnosis not present

## 2023-07-19 DIAGNOSIS — K828 Other specified diseases of gallbladder: Secondary | ICD-10-CM | POA: Diagnosis not present

## 2023-07-19 DIAGNOSIS — K760 Fatty (change of) liver, not elsewhere classified: Secondary | ICD-10-CM | POA: Diagnosis not present

## 2023-07-19 DIAGNOSIS — R7303 Prediabetes: Secondary | ICD-10-CM | POA: Diagnosis not present

## 2023-07-19 DIAGNOSIS — I2510 Atherosclerotic heart disease of native coronary artery without angina pectoris: Secondary | ICD-10-CM | POA: Diagnosis not present

## 2023-07-19 DIAGNOSIS — N289 Disorder of kidney and ureter, unspecified: Secondary | ICD-10-CM | POA: Diagnosis not present

## 2023-07-19 DIAGNOSIS — F411 Generalized anxiety disorder: Secondary | ICD-10-CM | POA: Diagnosis not present

## 2023-07-19 DIAGNOSIS — E785 Hyperlipidemia, unspecified: Secondary | ICD-10-CM | POA: Diagnosis not present

## 2023-07-19 DIAGNOSIS — K224 Dyskinesia of esophagus: Secondary | ICD-10-CM | POA: Diagnosis not present

## 2023-07-19 DIAGNOSIS — I959 Hypotension, unspecified: Secondary | ICD-10-CM | POA: Diagnosis not present

## 2023-07-19 NOTE — Telephone Encounter (Signed)
 Copied from CRM [id number]. Topic: General - Other >> Jul 19, 2023 11:34 AM Truddie Crumble wrote: Reason for CRM: katlin from [facility] called regarding orders that was faxed on 3/18 and two that were faxed on 3/20. [facility] has not received them back yet. Order# are [id number], [id number] and [phone]   Advised Caitlin w/ [facility] that faxes are not going through and she gave her email address: [email or web address] .

## 2023-07-20 ENCOUNTER — Ambulatory Visit (HOSPITAL_COMMUNITY)
Admission: RE | Admit: 2023-07-20 | Discharge: 2023-07-20 | Disposition: A | Discharge status: Home / Self Care | Source: Ambulatory Visit | Attending: Family Medicine | Admitting: Family Medicine

## 2023-07-20 DIAGNOSIS — H532 Diplopia: Secondary | ICD-10-CM | POA: Insufficient documentation

## 2023-07-20 DIAGNOSIS — R55 Syncope and collapse: Secondary | ICD-10-CM | POA: Insufficient documentation

## 2023-07-22 DIAGNOSIS — N289 Disorder of kidney and ureter, unspecified: Secondary | ICD-10-CM | POA: Diagnosis not present

## 2023-07-22 DIAGNOSIS — M5416 Radiculopathy, lumbar region: Secondary | ICD-10-CM | POA: Diagnosis not present

## 2023-07-22 DIAGNOSIS — K289 Gastrojejunal ulcer, unspecified as acute or chronic, without hemorrhage or perforation: Secondary | ICD-10-CM | POA: Diagnosis not present

## 2023-07-22 DIAGNOSIS — K828 Other specified diseases of gallbladder: Secondary | ICD-10-CM | POA: Diagnosis not present

## 2023-07-22 DIAGNOSIS — D649 Anemia, unspecified: Secondary | ICD-10-CM | POA: Diagnosis not present

## 2023-07-22 DIAGNOSIS — F411 Generalized anxiety disorder: Secondary | ICD-10-CM | POA: Diagnosis not present

## 2023-07-22 DIAGNOSIS — K760 Fatty (change of) liver, not elsewhere classified: Secondary | ICD-10-CM | POA: Diagnosis not present

## 2023-07-22 DIAGNOSIS — R627 Adult failure to thrive: Secondary | ICD-10-CM | POA: Diagnosis not present

## 2023-07-22 DIAGNOSIS — I700 Atherosclerosis of aorta: Secondary | ICD-10-CM | POA: Diagnosis not present

## 2023-07-22 DIAGNOSIS — Z48815 Encounter for surgical aftercare following surgery on the digestive system: Secondary | ICD-10-CM | POA: Diagnosis not present

## 2023-07-22 DIAGNOSIS — I119 Hypertensive heart disease without heart failure: Secondary | ICD-10-CM | POA: Diagnosis not present

## 2023-07-22 DIAGNOSIS — G4733 Obstructive sleep apnea (adult) (pediatric): Secondary | ICD-10-CM | POA: Diagnosis not present

## 2023-07-22 DIAGNOSIS — E8809 Other disorders of plasma-protein metabolism, not elsewhere classified: Secondary | ICD-10-CM | POA: Diagnosis not present

## 2023-07-22 DIAGNOSIS — M109 Gout, unspecified: Secondary | ICD-10-CM | POA: Diagnosis not present

## 2023-07-22 DIAGNOSIS — Z431 Encounter for attention to gastrostomy: Secondary | ICD-10-CM | POA: Diagnosis not present

## 2023-07-22 DIAGNOSIS — N3946 Mixed incontinence: Secondary | ICD-10-CM | POA: Diagnosis not present

## 2023-07-22 DIAGNOSIS — M179 Osteoarthritis of knee, unspecified: Secondary | ICD-10-CM | POA: Diagnosis not present

## 2023-07-22 DIAGNOSIS — I2510 Atherosclerotic heart disease of native coronary artery without angina pectoris: Secondary | ICD-10-CM | POA: Diagnosis not present

## 2023-07-22 DIAGNOSIS — I959 Hypotension, unspecified: Secondary | ICD-10-CM | POA: Diagnosis not present

## 2023-07-22 DIAGNOSIS — K2970 Gastritis, unspecified, without bleeding: Secondary | ICD-10-CM | POA: Diagnosis not present

## 2023-07-22 DIAGNOSIS — E785 Hyperlipidemia, unspecified: Secondary | ICD-10-CM | POA: Diagnosis not present

## 2023-07-22 DIAGNOSIS — R7303 Prediabetes: Secondary | ICD-10-CM | POA: Diagnosis not present

## 2023-07-22 DIAGNOSIS — K224 Dyskinesia of esophagus: Secondary | ICD-10-CM | POA: Diagnosis not present

## 2023-07-22 DIAGNOSIS — K219 Gastro-esophageal reflux disease without esophagitis: Secondary | ICD-10-CM | POA: Diagnosis not present

## 2023-07-26 ENCOUNTER — Telehealth: Payer: Self-pay | Admitting: Family Medicine

## 2023-07-26 DIAGNOSIS — M5416 Radiculopathy, lumbar region: Secondary | ICD-10-CM | POA: Diagnosis not present

## 2023-07-26 DIAGNOSIS — N289 Disorder of kidney and ureter, unspecified: Secondary | ICD-10-CM | POA: Diagnosis not present

## 2023-07-26 DIAGNOSIS — M179 Osteoarthritis of knee, unspecified: Secondary | ICD-10-CM | POA: Diagnosis not present

## 2023-07-26 DIAGNOSIS — K760 Fatty (change of) liver, not elsewhere classified: Secondary | ICD-10-CM | POA: Diagnosis not present

## 2023-07-26 DIAGNOSIS — Z431 Encounter for attention to gastrostomy: Secondary | ICD-10-CM | POA: Diagnosis not present

## 2023-07-26 DIAGNOSIS — I2510 Atherosclerotic heart disease of native coronary artery without angina pectoris: Secondary | ICD-10-CM | POA: Diagnosis not present

## 2023-07-26 DIAGNOSIS — K2970 Gastritis, unspecified, without bleeding: Secondary | ICD-10-CM | POA: Diagnosis not present

## 2023-07-26 DIAGNOSIS — K219 Gastro-esophageal reflux disease without esophagitis: Secondary | ICD-10-CM | POA: Diagnosis not present

## 2023-07-26 DIAGNOSIS — R7303 Prediabetes: Secondary | ICD-10-CM | POA: Diagnosis not present

## 2023-07-26 DIAGNOSIS — F411 Generalized anxiety disorder: Secondary | ICD-10-CM | POA: Diagnosis not present

## 2023-07-26 DIAGNOSIS — I700 Atherosclerosis of aorta: Secondary | ICD-10-CM | POA: Diagnosis not present

## 2023-07-26 DIAGNOSIS — N3946 Mixed incontinence: Secondary | ICD-10-CM | POA: Diagnosis not present

## 2023-07-26 DIAGNOSIS — E785 Hyperlipidemia, unspecified: Secondary | ICD-10-CM | POA: Diagnosis not present

## 2023-07-26 DIAGNOSIS — K828 Other specified diseases of gallbladder: Secondary | ICD-10-CM | POA: Diagnosis not present

## 2023-07-26 DIAGNOSIS — G4733 Obstructive sleep apnea (adult) (pediatric): Secondary | ICD-10-CM | POA: Diagnosis not present

## 2023-07-26 DIAGNOSIS — R627 Adult failure to thrive: Secondary | ICD-10-CM | POA: Diagnosis not present

## 2023-07-26 DIAGNOSIS — I959 Hypotension, unspecified: Secondary | ICD-10-CM | POA: Diagnosis not present

## 2023-07-26 DIAGNOSIS — K289 Gastrojejunal ulcer, unspecified as acute or chronic, without hemorrhage or perforation: Secondary | ICD-10-CM | POA: Diagnosis not present

## 2023-07-26 DIAGNOSIS — E8809 Other disorders of plasma-protein metabolism, not elsewhere classified: Secondary | ICD-10-CM | POA: Diagnosis not present

## 2023-07-26 DIAGNOSIS — K224 Dyskinesia of esophagus: Secondary | ICD-10-CM | POA: Diagnosis not present

## 2023-07-26 DIAGNOSIS — Z48815 Encounter for surgical aftercare following surgery on the digestive system: Secondary | ICD-10-CM | POA: Diagnosis not present

## 2023-07-26 DIAGNOSIS — D649 Anemia, unspecified: Secondary | ICD-10-CM | POA: Diagnosis not present

## 2023-07-26 DIAGNOSIS — I119 Hypertensive heart disease without heart failure: Secondary | ICD-10-CM | POA: Diagnosis not present

## 2023-07-26 DIAGNOSIS — M109 Gout, unspecified: Secondary | ICD-10-CM | POA: Diagnosis not present

## 2023-07-26 NOTE — Telephone Encounter (Signed)
Forms faxed again

## 2023-07-26 NOTE — Telephone Encounter (Signed)
 Copied from CRM [id number]. Topic: General - Other >> Jul 26, 2023  3:03 PM Deaijah H wrote: Reason for CRM: [facility] called in stating the incorrect order was emailed. Advised Order was faxed on 3/20 Order #'s [id number] & [id number] would like to confirm if order was received, stated she will resend it again. Call back # [phone]

## 2023-07-27 ENCOUNTER — Inpatient Hospital Stay (HOSPITAL_COMMUNITY)

## 2023-07-27 ENCOUNTER — Inpatient Hospital Stay (HOSPITAL_COMMUNITY)
Admission: AD | Admit: 2023-07-27 | Discharge: 2023-08-23 | DRG: 620 | Disposition: A | Discharge status: Home Health | Source: Ambulatory Visit | Attending: Surgery | Admitting: Surgery

## 2023-07-27 ENCOUNTER — Encounter (HOSPITAL_COMMUNITY): Payer: Self-pay | Admitting: Surgery

## 2023-07-27 ENCOUNTER — Telehealth: Payer: Self-pay

## 2023-07-27 ENCOUNTER — Encounter (HOSPITAL_COMMUNITY): Payer: Self-pay

## 2023-07-27 ENCOUNTER — Other Ambulatory Visit: Payer: Self-pay

## 2023-07-27 DIAGNOSIS — R197 Diarrhea, unspecified: Secondary | ICD-10-CM | POA: Diagnosis not present

## 2023-07-27 DIAGNOSIS — Z86718 Personal history of other venous thrombosis and embolism: Secondary | ICD-10-CM

## 2023-07-27 DIAGNOSIS — E876 Hypokalemia: Secondary | ICD-10-CM | POA: Diagnosis present

## 2023-07-27 DIAGNOSIS — G4730 Sleep apnea, unspecified: Secondary | ICD-10-CM | POA: Diagnosis not present

## 2023-07-27 DIAGNOSIS — Z8719 Personal history of other diseases of the digestive system: Secondary | ICD-10-CM

## 2023-07-27 DIAGNOSIS — L89151 Pressure ulcer of sacral region, stage 1: Secondary | ICD-10-CM | POA: Diagnosis present

## 2023-07-27 DIAGNOSIS — Z6837 Body mass index (BMI) 37.0-37.9, adult: Secondary | ICD-10-CM | POA: Diagnosis not present

## 2023-07-27 DIAGNOSIS — Z7901 Long term (current) use of anticoagulants: Secondary | ICD-10-CM | POA: Diagnosis not present

## 2023-07-27 DIAGNOSIS — I10 Essential (primary) hypertension: Secondary | ICD-10-CM | POA: Diagnosis not present

## 2023-07-27 DIAGNOSIS — K2100 Gastro-esophageal reflux disease with esophagitis, without bleeding: Secondary | ICD-10-CM | POA: Diagnosis present

## 2023-07-27 DIAGNOSIS — R627 Adult failure to thrive: Principal | ICD-10-CM | POA: Diagnosis present

## 2023-07-27 DIAGNOSIS — K6389 Other specified diseases of intestine: Secondary | ICD-10-CM | POA: Diagnosis not present

## 2023-07-27 DIAGNOSIS — N281 Cyst of kidney, acquired: Secondary | ICD-10-CM | POA: Diagnosis not present

## 2023-07-27 DIAGNOSIS — K760 Fatty (change of) liver, not elsewhere classified: Secondary | ICD-10-CM | POA: Diagnosis not present

## 2023-07-27 DIAGNOSIS — K316 Fistula of stomach and duodenum: Secondary | ICD-10-CM | POA: Diagnosis present

## 2023-07-27 DIAGNOSIS — Z9884 Bariatric surgery status: Secondary | ICD-10-CM

## 2023-07-27 DIAGNOSIS — K5000 Crohn's disease of small intestine without complications: Secondary | ICD-10-CM | POA: Diagnosis not present

## 2023-07-27 DIAGNOSIS — D638 Anemia in other chronic diseases classified elsewhere: Secondary | ICD-10-CM | POA: Diagnosis present

## 2023-07-27 DIAGNOSIS — Y838 Other surgical procedures as the cause of abnormal reaction of the patient, or of later complication, without mention of misadventure at the time of the procedure: Secondary | ICD-10-CM | POA: Diagnosis present

## 2023-07-27 DIAGNOSIS — R634 Abnormal weight loss: Secondary | ICD-10-CM | POA: Diagnosis not present

## 2023-07-27 DIAGNOSIS — K9423 Gastrostomy malfunction: Secondary | ICD-10-CM | POA: Diagnosis present

## 2023-07-27 DIAGNOSIS — F411 Generalized anxiety disorder: Secondary | ICD-10-CM | POA: Diagnosis present

## 2023-07-27 DIAGNOSIS — Z87891 Personal history of nicotine dependence: Secondary | ICD-10-CM | POA: Diagnosis not present

## 2023-07-27 DIAGNOSIS — K2950 Unspecified chronic gastritis without bleeding: Secondary | ICD-10-CM | POA: Diagnosis not present

## 2023-07-27 DIAGNOSIS — E785 Hyperlipidemia, unspecified: Secondary | ICD-10-CM | POA: Diagnosis present

## 2023-07-27 DIAGNOSIS — G4733 Obstructive sleep apnea (adult) (pediatric): Secondary | ICD-10-CM

## 2023-07-27 DIAGNOSIS — R1319 Other dysphagia: Secondary | ICD-10-CM | POA: Diagnosis not present

## 2023-07-27 DIAGNOSIS — Z79899 Other long term (current) drug therapy: Secondary | ICD-10-CM

## 2023-07-27 DIAGNOSIS — I82409 Acute embolism and thrombosis of unspecified deep veins of unspecified lower extremity: Secondary | ICD-10-CM | POA: Diagnosis present

## 2023-07-27 DIAGNOSIS — E43 Unspecified severe protein-calorie malnutrition: Principal | ICD-10-CM | POA: Diagnosis present

## 2023-07-27 DIAGNOSIS — I2510 Atherosclerotic heart disease of native coronary artery without angina pectoris: Secondary | ICD-10-CM | POA: Diagnosis not present

## 2023-07-27 DIAGNOSIS — K9420 Gastrostomy complication, unspecified: Secondary | ICD-10-CM | POA: Diagnosis not present

## 2023-07-27 DIAGNOSIS — R262 Difficulty in walking, not elsewhere classified: Secondary | ICD-10-CM | POA: Diagnosis not present

## 2023-07-27 DIAGNOSIS — K633 Ulcer of intestine: Secondary | ICD-10-CM | POA: Diagnosis not present

## 2023-07-27 DIAGNOSIS — Z8711 Personal history of peptic ulcer disease: Secondary | ICD-10-CM

## 2023-07-27 DIAGNOSIS — Z743 Need for continuous supervision: Secondary | ICD-10-CM | POA: Diagnosis not present

## 2023-07-27 DIAGNOSIS — K289 Gastrojejunal ulcer, unspecified as acute or chronic, without hemorrhage or perforation: Secondary | ICD-10-CM | POA: Diagnosis not present

## 2023-07-27 DIAGNOSIS — R000 Tachycardia, unspecified: Secondary | ICD-10-CM | POA: Diagnosis not present

## 2023-07-27 LAB — CREATININE, SERUM
Creatinine, Ser: 0.88 mg/dL (ref 0.44–1.00)
GFR, Estimated: >60 mL/min (ref >60)

## 2023-07-27 LAB — CBC
HCT: 31.7 % — ABNORMAL LOW (ref 36.0–46.0)
Hemoglobin: 9.7 g/dL — ABNORMAL LOW (ref 12.0–15.0)
MCH: 29.4 pg (ref 26.0–34.0)
MCHC: 30.6 g/dL (ref 30.0–36.0)
MCV: 96.1 fL (ref 80.0–100.0)
Platelets: 215 10*3/uL (ref 150–400)
RBC: 3.3 MIL/uL — ABNORMAL LOW (ref 3.87–5.11)
RDW: 16.7 % — ABNORMAL HIGH (ref 11.5–15.5)
WBC: 4 10*3/uL (ref 4.0–10.5)
nRBC: 0 % (ref 0.0–0.2)

## 2023-07-27 LAB — IRON AND TIBC
Iron: 40 ug/dL (ref 28–170)
Saturation Ratios: 30 % (ref 10.4–31.8)
TIBC: 133 ug/dL — ABNORMAL LOW (ref 250–450)
UIBC: 93 ug/dL

## 2023-07-27 LAB — FERRITIN: Ferritin: 286 ng/mL (ref 11–307)

## 2023-07-27 LAB — PREALBUMIN: Prealbumin: 15 mg/dL — ABNORMAL LOW (ref 18–38)

## 2023-07-27 LAB — VITAMIN B12: Vitamin B-12: 619 pg/mL (ref 180–914)

## 2023-07-27 MED ORDER — SODIUM CHLORIDE 0.9% FLUSH
10.0000 mL | INTRAVENOUS | Status: DC | PRN
  Administered 2023-08-16: 20 mL
  Administered 2023-08-16: 10 mL

## 2023-07-27 MED ORDER — DOCUSATE SODIUM 100 MG PO CAPS
100.0000 mg | ORAL_CAPSULE | Freq: Two times a day (BID) | ORAL | Status: DC
  Administered 2023-08-01 – 2023-08-20 (×4): 100 mg via ORAL
  Filled 2023-07-27 (×36): qty 1

## 2023-07-27 MED ORDER — ENSURE MAX PROTEIN PO LIQD
11.0000 [oz_av] | Freq: Every day | ORAL | Status: DC
  Administered 2023-07-30 – 2023-08-23 (×21): 11 [oz_av] via ORAL

## 2023-07-27 MED ORDER — SIMETHICONE 80 MG PO CHEW: 40.0000 mg | CHEWABLE_TABLET | Freq: Four times a day (QID) | ORAL | Status: DC | PRN

## 2023-07-27 MED ORDER — IOHEXOL 300 MG/ML  SOLN
100.0000 mL | Freq: Once | INTRAMUSCULAR | Status: AC | PRN
  Administered 2023-07-27: 100 mL via INTRAVENOUS

## 2023-07-27 MED ORDER — ENOXAPARIN SODIUM 40 MG/0.4ML IJ SOSY
40.0000 mg | PREFILLED_SYRINGE | INTRAMUSCULAR | Status: DC
  Administered 2023-07-27 – 2023-07-28 (×2): 40 mg via SUBCUTANEOUS
  Filled 2023-07-27 (×2): qty 0.4

## 2023-07-27 MED ORDER — THIAMINE HCL 100 MG/ML IJ SOLN
100.0000 mg | Freq: Every day | INTRAMUSCULAR | Status: AC
  Administered 2023-07-27 – 2023-07-31 (×5): 100 mg via INTRAVENOUS
  Filled 2023-07-27 (×5): qty 2

## 2023-07-27 MED ORDER — CHLORHEXIDINE GLUCONATE CLOTH 2 % EX PADS
6.0000 | MEDICATED_PAD | Freq: Every day | CUTANEOUS | Status: DC
  Administered 2023-07-27 – 2023-08-19 (×20): 6 via TOPICAL

## 2023-07-27 MED ORDER — ONDANSETRON 4 MG PO TBDP
4.0000 mg | ORAL_TABLET | Freq: Four times a day (QID) | ORAL | Status: DC | PRN
Start: 2023-07-27 — End: 2023-08-23

## 2023-07-27 MED ORDER — ONDANSETRON HCL 4 MG/2ML IJ SOLN
4.0000 mg | Freq: Four times a day (QID) | INTRAMUSCULAR | Status: DC | PRN
  Administered 2023-07-28 – 2023-08-12 (×6): 4 mg via INTRAVENOUS
  Filled 2023-07-27 (×5): qty 2

## 2023-07-27 MED ORDER — IOHEXOL 9 MG/ML PO SOLN
1000.0000 mL | ORAL | Status: AC
  Administered 2023-07-27: 1000 mL via ORAL

## 2023-07-27 MED ORDER — IOHEXOL 9 MG/ML PO SOLN
ORAL | Status: AC
  Filled 2023-07-27: qty 1000

## 2023-07-27 MED ORDER — ACETAMINOPHEN 325 MG PO TABS
650.0000 mg | ORAL_TABLET | Freq: Four times a day (QID) | ORAL | Status: DC
  Administered 2023-07-27 – 2023-08-23 (×57): 650 mg via ORAL
  Filled 2023-07-27 (×75): qty 2

## 2023-07-27 MED ORDER — PROMETHAZINE HCL 25 MG RE SUPP
25.0000 mg | Freq: Four times a day (QID) | RECTAL | Status: DC | PRN
  Administered 2023-07-27: 25 mg via RECTAL
  Filled 2023-07-27 (×3): qty 1

## 2023-07-27 NOTE — Telephone Encounter (Signed)
 Forms did go through on yesterday.

## 2023-07-27 NOTE — Progress Notes (Signed)
 PT Cancellation Note  Patient Details Name: Christine [name] MRN: [id number] DOB: 03-23-1951   Cancelled Treatment:    Reason Eval/Treat Not Completed: Pain limiting ability to participate;Fatigue/lethargy limiting ability to participate (pt reports she has "too much going on" right now to do PT. She reports she was ambulating with a rollator at home prior to admission.)   Tamala Ser PT 07/27/2023  Acute Rehabilitation Services  Office [phone]

## 2023-07-27 NOTE — Progress Notes (Signed)
 Received this direct admit via [facility] from surgeon's office, noted pt wearing a pulllup w 2 sm stage 2 bedsores. Pt requested that her rollator also be transported to hospital but has now refused to participate in PT. Pt immediately requesting pain meds for abd pain of "8."  Also noted lots of mucous and stringy secretions po but no true emesis (pt wiping mucous/saliva away w tissues). Enc pt to use BSC and not pullups so as to allow skin breakdown to heal. She verbalized understanding.

## 2023-07-27 NOTE — Progress Notes (Signed)
 Pharmacy Consult for TPN  Note pharmacy consult for TPN on this patient with an intolerance to enteral feeding. Patient with history of gastric bypass. She had a gastrostomy tube placed in October of 2024 which was dislodged a couple of months later. She is now unable to tolerate solid foods and is limited to a liquid diet. Concern for severe malnutrition and failure to thrive. Per surgery note today, recommendation for admission for 2 weeks of preoperative TPN prior to reversal of gastric bypass.  Labs have been ordered. PICC placement pending.  Plan for TPN to start 4/3 pending no complications. Will follow for dietitian recommendations. Start thiamine 100 mg IV daily x 5 days now given suspected risk of refeeding in setting of poor nutrition.   Pricilla Riffle, PharmD, BCPS Clinical Pharmacist 07/27/2023 1:47 PM

## 2023-07-27 NOTE — Telephone Encounter (Signed)
 Copied from CRM [id number]. Topic: General - Other >> Jul 26, 2023  3:03 PM Deaijah H wrote: Reason for CRM: [facility] called in stating the incorrect order was emailed. Advised Order was faxed on 3/20 Order #'s [id number] & [id number] would like to confirm if order was received, stated she will resend it again. Call back # [phone] >> Jul 27, 2023  8:59 AM Almira Coaster wrote: [facility] is calling to provide [place] with two fax numbers we're able to send orders/forms to. [phone] & [phone].

## 2023-07-27 NOTE — Progress Notes (Signed)
 Peripherally Inserted Central Catheter Placement  The IV Nurse has discussed with the patient and/or persons authorized to consent for the patient, the purpose of this procedure and the potential benefits and risks involved with this procedure.  The benefits include less needle sticks, lab draws from the catheter, and the patient may be discharged home with the catheter. Risks include, but not limited to, infection, bleeding, blood clot (thrombus formation), and puncture of an artery; nerve damage and irregular heartbeat and possibility to perform a PICC exchange if needed/ordered by physician.  Alternatives to this procedure were also discussed.  Bard Power PICC patient education guide, fact sheet on infection prevention and patient information card has been provided to patient /or left at bedside.    PICC Placement Documentation  PICC Double Lumen 07/27/23 Right Basilic 36 cm 1 cm (Active)  Indication for Insertion or Continuance of Line Administration of hyperosmolar/irritating solutions (i.e. TPN, Vancomycin, etc.) 07/27/23 1830  Exposed Catheter (cm) 1 cm 07/27/23 1830  Site Assessment Clean, Dry, Intact 07/27/23 1830  Lumen #1 Status Flushed;Saline locked;Blood return noted 07/27/23 1830  Lumen #2 Status Flushed;Saline locked;Blood return noted 07/27/23 1830  Dressing Type Transparent;Securing device 07/27/23 1830  Dressing Status Antimicrobial disc/dressing in place;Clean, Dry, Intact 07/27/23 1830  Line Care Connections checked and tightened 07/27/23 1830  Line Adjustment (NICU/IV Team Only) No 07/27/23 1830  Dressing Intervention New dressing;Adhesive placed at insertion site (IV team only) 07/27/23 1830  Dressing Change Due 08/03/23 07/27/23 1830       Christine [name] 07/27/2023, 6:32 PM

## 2023-07-27 NOTE — Plan of Care (Signed)

## 2023-07-27 NOTE — H&P (Signed)
 PROVIDER:  Gray Bernhardt Ryeleigh Santore, MD   MRN: [id number] DOB: August 07, 1950 DATE OF ENCOUNTER: 07/27/2023 Interval History:    Plan Ms. Brzozowski continues to lose weight at a rapid pace.  At this point she can not tolerate solid foods and is only tolerating liquids.  She is felt terrible ever since her ulcer surgery back in December.  Her gastrostomy tube fell out and she has a lot of pain in the location of her G-tube.  The midline wound opened up and is draining liquid still.     Physical Examination:    Physical Exam    Abdomen -G-tube scar healing, midline wound with small opening.     Assessment and Plan:    Assessment Christine [name] is a 73 y.o. female who underwent robotic gastric bypass on 09/14/22.   11/08/22 admitted for Sepsis, elevated AST, elevated bilirubin 11/15/22 admitted for metabolic encephalopathy 11/30/22 admitted for dehydration, hallucination 12/06/22 admitted for metabolic encephalopathy, gout, acute kidney injury and muscle weakness 01/03/23 seen in ED for weakness and altered mental status 01/08/23 admitted for acute metabolic encephalopathy, hypernatremia and AKI 01/25/23 Laparoscopic insertion remnant gastrostomy tube, lap chole with IOC, EGD (Dr Dossie Der) 03/11/23 admitted for pneumoperitoneum, left upper quadrant abdominal pain and problem with   gastrostomy tube 03/21/23 Exploratory laparotomy, patch repair of gastric ulcer (Dr Freida Busman) 04/09/23 EGD w/ Dr. Myrtie Neither              04/10/23 Admitted for DVT and dislodged gastrostomy tube   Diagnoses and all orders for this visit:   Failure to thrive in adult   Esophageal dysphagia   Marginal ulcer   History of gastric bypass       [name] has had a terrible year after a gastric bypass.  She continues to have failure to thrive and now is now tolerating solid foods.  I am concerned she has a stricture at her marginal ulcer site that is not allowing her to tolerate a regular postoperative  bariatric diet.  At this point she is unable to walk steadily, she feels very weak, and I am concerned for severe malnutrition and failure to thrive.  We discussed options moving forward.  I recommend robotic reversal of her gastric bypass.  I recommend direct admission to [facility] for 2 weeks of preoperative TPN in preparation for this larger operation.  We discussed the procedure itself as well as its risk, benefits, and alternatives.  After full discussion all questions answered the patient is very enthusiastic about having her gastric bypass reversed as she has been so miserable for the last year.  When she is admitted to the hospital we will run some imaging and lab tests to complete her preoperative evaluation.     Return for Direct admission to [facility].     The plan was discussed in detail with the patient today, who expressed understanding.  The patient has my contact information, and understands to call me with any additional questions or concerns in the interval.  I would be happy to see the patient back sooner if the need arises.    [name] JEFFREY Ivet Guerrieri, MD      MBSAQIP Long Term Follow Up Questionnaire  (6 months post op and onward)   Diabetes mellitus, on medication: (If diet controlled or no medications, mark no): No GERD, on daily medication: no Hyperlipidemia, on medication: no Hypertension, on medication: no *Number of medications to treat hypertension:0 Sleep apnea requiring CPAP: yes Weight: 170.6  Readmission: yes Reoperation: yes Intervention Related to Bariatric Procedure: yes   If any of the previous 3 questions are answered yes, please explain the date, diagnosis and treatment.  If any of the above occurred outside [facility], please list the name of the facility, and request medical records to be scanned into [facility]:     11/08/22 admitted for Sepsis, elevated AST, elevated bilirubin 11/15/22 admitted for metabolic encephalopathy 11/30/22  admitted for dehydration, hallucination 12/06/22 admitted for metabolic encephalopathy, gout, acute kidney injury and muscle weakness 01/03/23 seen in ED for weakness and altered mental status 01/08/23 admitted for acute metabolic encephalopathy, hypernatremia and AKI 01/25/23 Laparoscopic insertion remnant gastrostomy tube (Dr Dossie Der) 03/11/23 admitted for pneumoperitoneum, left upper quadrant abdominal pain and problem with   gastrostomy tube 03/21/23 Exploratory laparotomy, patch repair of gastric ulcer (Dr Freida Busman) 04/10/23 Admitted for DVT and dislodged gastrostomy tube   Pt was in SNF from discharge on 11/15/22 until admittance at hospital on 04/10/23. Pt d/c home due to insurance. Pt d/c with home health care in place. Home health is managed by PCP.    Pt stated feeding tube fell out on 05/05/23.    Please document recent labwork including Creatinine, Albumin and A1C   Latest Reference Range & Units Most Recent  COMPREHENSIVE METABOLIC PANEL WITH GFR   Rpt ! 07/11/23 14:25  Sodium 135 - 145 mEq/L 144 07/11/23 14:25  Potassium 3.5 - 5.1 mEq/L 3.9 07/11/23 14:25  Chloride 96 - 112 mEq/L 110 07/11/23 14:25  CO2 19 - 32 mEq/L 23 07/11/23 14:25  Glucose 70 - 99 mg/dL 89 9/60/45 40:98  Mean Plasma Glucose mg/dL 119.14 11/01/27 56:21  BUN 6 - 23 mg/dL 21 07/02/63 78:46  Creatinine 0.40 - 1.20 mg/dL 9.62 9/52/84 13:24  Calcium 8.4 - 10.5 mg/dL 9.1 07/25/00 72:53  Anion gap 5 - 15  8 05/10/23 15:45  Calcium Ionized 1.15 - 1.40 mmol/L 0.99 (L) 01/25/23 18:29  Phosphorus 2.5 - 4.6 mg/dL 3.2 66/44/03 47:42  Magnesium 1.7 - 2.4 mg/dL 1.6 (L) 59/56/38 75:64  Alkaline Phosphatase 39 - 117 U/L 84 07/11/23 14:25  Albumin 3.5 - 5.2 g/dL 2.8 (L) 3/32/95 18:84  AG Ratio 1.0 - 2.5 (calc) 1.2 06/29/17 15:16  Lipase 11 - 51 U/L 28 04/10/23 20:18  AST 0 - 37 U/L 26 07/11/23 14:25  ALT 0 - 35 U/L 8 07/11/23 14:25  Total Protein 6.0 - 8.3 g/dL 6.0 1/66/06 30:16  !: Data is abnormal (L):  Data is abnormally low   Latest Reference Range & Units Most Recent  Hemoglobin A1C 4.8 - 5.6 % 6.3 (H) 09/01/22 10:59  (H): Data is abnormally high Please update and document current medications  acetaminophen (TYLENOL) 325 MG tablet apixaban (ELIQUIS) 5 MG TABS tablet ascorbic acid (VITAMIN C) 500 MG tablet colchicine 0.6 MG tablet ferrous sulfate 325 (65 FE) MG EC tablet furosemide (LASIX) 20 MG tablet lidocaine (XYLOCAINE) 2 % solution meclizine (ANTIVERT) 12.5 MG tablet Multiple Vitamin (MULTIVITAMIN WITH MINERALS) TABS tablet ondansetron (ZOFRAN) 4 MG tablet

## 2023-07-28 ENCOUNTER — Encounter: Payer: Self-pay | Admitting: Family Medicine

## 2023-07-28 LAB — MAGNESIUM: Magnesium: 1.7 mg/dL (ref 1.7–2.4)

## 2023-07-28 LAB — COMPREHENSIVE METABOLIC PANEL WITH GFR
ALT: 15 U/L (ref 0–44)
AST: 34 U/L (ref 15–41)
Albumin: 2.1 g/dL — ABNORMAL LOW (ref 3.5–5.0)
Alkaline Phosphatase: 66 U/L (ref 38–126)
Anion gap: 5 (ref 5–15)
BUN: 26 mg/dL — ABNORMAL HIGH (ref 8–23)
CO2: 21 mmol/L — ABNORMAL LOW (ref 22–32)
Calcium: 8.3 mg/dL — ABNORMAL LOW (ref 8.9–10.3)
Chloride: 118 mmol/L — ABNORMAL HIGH (ref 98–111)
Creatinine, Ser: 0.8 mg/dL (ref 0.44–1.00)
GFR, Estimated: >60 mL/min (ref >60)
Glucose, Bld: 73 mg/dL (ref 70–99)
Potassium: 3.1 mmol/L — ABNORMAL LOW (ref 3.5–5.1)
Sodium: 144 mmol/L (ref 135–145)
Total Bilirubin: 0.9 mg/dL (ref 0.0–1.2)
Total Protein: 5.1 g/dL — ABNORMAL LOW (ref 6.5–8.1)

## 2023-07-28 LAB — CBC
HCT: 30.3 % — ABNORMAL LOW (ref 36.0–46.0)
Hemoglobin: 9.3 g/dL — ABNORMAL LOW (ref 12.0–15.0)
MCH: 29.7 pg (ref 26.0–34.0)
MCHC: 30.7 g/dL (ref 30.0–36.0)
MCV: 96.8 fL (ref 80.0–100.0)
Platelets: 199 10*3/uL (ref 150–400)
RBC: 3.13 MIL/uL — ABNORMAL LOW (ref 3.87–5.11)
RDW: 16.8 % — ABNORMAL HIGH (ref 11.5–15.5)
WBC: 3.9 10*3/uL — ABNORMAL LOW (ref 4.0–10.5)
nRBC: 0 % (ref 0.0–0.2)

## 2023-07-28 LAB — GLUCOSE, CAPILLARY
Glucose-Capillary: 103 mg/dL — ABNORMAL HIGH (ref 70–99)
Glucose-Capillary: 94 mg/dL (ref 70–99)

## 2023-07-28 LAB — PHOSPHORUS: Phosphorus: 3.4 mg/dL (ref 2.5–4.6)

## 2023-07-28 MED ORDER — MAGNESIUM SULFATE 50 % IJ SOLN: 3.0000 g | Freq: Once | INTRAVENOUS | Status: DC

## 2023-07-28 MED ORDER — NYSTATIN 100000 UNIT/GM EX POWD
Freq: Three times a day (TID) | CUTANEOUS | Status: DC
  Administered 2023-08-09 – 2023-08-11 (×3): 1 via TOPICAL
  Filled 2023-07-28: qty 15

## 2023-07-28 MED ORDER — MAGNESIUM SULFATE 4 GM/100ML IV SOLN
4.0000 g | Freq: Once | INTRAVENOUS | Status: AC
  Administered 2023-07-28: 4 g via INTRAVENOUS
  Filled 2023-07-28: qty 100

## 2023-07-28 MED ORDER — INSULIN ASPART 100 UNIT/ML IJ SOLN
0.0000 [IU] | Freq: Four times a day (QID) | INTRAMUSCULAR | Status: DC
  Administered 2023-07-31: 1 [IU] via SUBCUTANEOUS

## 2023-07-28 MED ORDER — ALUM & MAG HYDROXIDE-SIMETH 200-200-20 MG/5ML PO SUSP
30.0000 mL | Freq: Four times a day (QID) | ORAL | Status: DC | PRN
  Administered 2023-07-28 – 2023-08-07 (×3): 30 mL via ORAL
  Filled 2023-07-28 (×3): qty 30

## 2023-07-28 MED ORDER — SODIUM CHLORIDE 4 MEQ/ML IV SOLN
INTRAVENOUS | Status: AC
  Filled 2023-07-28: qty 480

## 2023-07-28 MED ORDER — THIAMINE HCL 100 MG/ML IJ SOLN: 100.0000 mg | Freq: Every day | INTRAMUSCULAR | Status: DC

## 2023-07-28 MED ORDER — POTASSIUM CHLORIDE 10 MEQ/50ML IV SOLN
10.0000 meq | INTRAVENOUS | Status: AC
Start: 2023-07-28 — End: 2023-07-28
  Administered 2023-07-28 (×6): 10 meq via INTRAVENOUS
  Filled 2023-07-28 (×6): qty 50

## 2023-07-28 NOTE — Progress Notes (Addendum)
 Progress Note: General Surgery Service   Chief Complaint/Subjective: Doing okay today, little better than yesterday  Objective: Vital signs in last 24 hours: Temp:  [97.4 F (36.3 C)-98.4 F (36.9 C)] 98.3 F (36.8 C) (04/03 0547) Pulse Rate:  [55-80] 70 (04/03 0547) Resp:  [12-18] 15 (04/03 0547) BP: (126-159)/(57-92) 154/75 (04/03 0547) SpO2:  [100 %] 100 % (04/03 0547) Weight:  [82.2 kg-84.1 kg] 84.1 kg (04/03 0500) Last BM Date : 07/27/23  Intake/Output from previous day: 04/02 0701 - 04/03 0700 In: 180 [P.O.:180] Out: -  Intake/Output this shift: No intake/output data recorded.  GI: Abd Dressing over umbilical site that has been draining, soft, nontender  Lab Results: CBC  Recent Labs    07/27/23 1902 07/28/23 0425  WBC 4.0 3.9*  HGB 9.7* 9.3*  HCT 31.7* 30.3*  PLT 215 199   BMET Recent Labs    07/27/23 1902 07/28/23 0425  NA  --  144  K  --  3.1*  CL  --  118*  CO2  --  21*  GLUCOSE  --  73  BUN  --  26*  CREATININE 0.88 0.80  CALCIUM  --  8.3*   PT/INR No results for input(s): "LABPROT", "INR" in the last 72 hours. ABG No results for input(s): "PHART", "HCO3" in the last 72 hours.  Invalid input(s): "PCO2", "PO2"  Anti-infectives: Anti-infectives (From admission, onward)    None       Medications: Scheduled Meds:  acetaminophen  650 mg Oral Q6H   Chlorhexidine Gluconate Cloth  6 each Topical Daily   docusate sodium  100 mg Oral BID   enoxaparin (LOVENOX) injection  40 mg Subcutaneous Q24H   nystatin   Topical TID   Ensure Max Protein  11 oz Oral Daily   thiamine (VITAMIN B1) injection  100 mg Intravenous Daily   Continuous Infusions:  magnesium sulfate bolus IVPB     potassium chloride     PRN Meds:.ondansetron **OR** ondansetron (ZOFRAN) IV, promethazine, simethicone, sodium chloride flush  Assessment/Plan: Christine [name] has had a terrible year after a gastric bypass.  She continues to have failure to thrive and now is now  tolerating solid foods.  I am concerned she has a stricture at her marginal ulcer site that is not allowing her to tolerate a regular postoperative bariatric diet.  At this point she is unable to walk steadily, she feels very weak, and I am concerned for severe malnutrition and failure to thrive.  We discussed options moving forward.  I recommend robotic reversal of her gastric bypass.  I recommend direct admission to [facility] for 2 weeks of preoperative TPN in preparation for this larger operation.  We discussed the procedure itself as well as its risk, benefits, and alternatives.  After full discussion all questions answered the patient is very enthusiastic about having her gastric bypass reversed as she has been so miserable for the last year.    Severe protein calorie malnutrition - albumin and prealbumin low on labs yesterday, initiate TPN Hypokalemia - replace today Hypomagnesemia - replace today Thiamine level pending, being replaced prior to TPN initiation Umbilical drainage - Nystatin powder and local care with daily bandages Failure to thrive - TPN and full liquid diet for comfort, plan for 2 weeks of nutrition then robotic reversal of gastric bypass Stage 1 sacral pressure ulcer present on admission - local care   LOS: 1 day    Quentin Ore, MD  [facility] Use  [email or web address] to contact on call provider  Daily Billing: [id number] - Moderate MDM

## 2023-07-28 NOTE — Progress Notes (Signed)
 PHARMACY - TOTAL PARENTERAL NUTRITION CONSULT NOTE   Indication:  Intolerance to enteral feeding   Patient Measurements: Height: 5\' 1"  (154.9 cm) Weight: 84.1 kg (185 lb 6.5 oz) IBW/kg (Calculated) : 47.8 TPN AdjBW (KG): 56.4 Body mass index is 35.03 kg/m.   Assessment:  Pharmacy is consulted to start TPN on 73 yo female with PMG including gastric bypass. Pt has had failure to thrive and is not tolerating solid foods. 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 Plan to give TPN in anticipation of surgery reversal.   Glucose / Insulin:  - no history of DM  - CBG ok on BMP  Electrolytes:  - K+ 3.1, low  - Magnesium 1.7, low  - Others are WNL, including CorrCa at 9.8  Renal:  - Scr <1  - BUN 26  Hepatic:  - LFTs WNL  - TBili 0.9  Intake / Output; MIVF:  - I/O likely not fully charted  - Not on IVF  GI Imaging: GI Surgeries / Procedures:   Central access: 4/2 /25 TPN start date: 07/28/23   Nutritional Goals: Goal TPN rate is --- mL/hr (provides --- g of protein and --- kcals per day)  RD Assessment: - Pending     Current Nutrition:  - Bariatric diet   Plan:  Now - Potassium chloride 10 meq IV x 6 ordered by MD  - Magnesium sulfate 4 mg IV x 1    Start TPN at 40 mL/hr at 1800 Electrolytes in TPN:  Na 86mEq/L K 21mEq/L Ca 100mEq/L Mg 73mEq/L Phos 31mmol/L.  Cl:Ac 1:1 Add standard MVI and trace elements to TPN Initiate Sensitive q6h SSI and adjust as needed  Not on any IVF  Thiamine 100 mg IV daily x 5 through 4/6  BMP, magnesium, phosphorus with AM labs  Monitor TPN labs on Mon/Thurs    Adalberto Cole, PharmD, BCPS 07/28/2023 11:22 AM

## 2023-07-28 NOTE — TOC Initial Note (Signed)
 Transition of Care [facility]) - Initial/Assessment Note    Patient Details  Name: Christine [name] MRN: [id number] Date of Birth: 1950/10/24  Transition of Care [facility]) CM/SW Contact:    Howell Rucks, RN Phone Number: 07/28/2023, 12:58 PM  Clinical Narrative:  Met with pt at bedside to introduce role of TOC/NCM and review for dc planning, pt reports she has an established PCP and pharmacy, reports she is currently receiving HH services with [facility], NCM will confirm services being received, reports she has a Rollator. Pt reports she resides alone with no family local, reports she is independent with her self care. PT eval completed, recommendation for [facility] PT, pt agreeable, prefers to continue with [facility]. TOC will continue to follow.                   Expected Discharge Plan: Home w Home Health Services Barriers to Discharge: Continued Medical Work up   Patient Goals and CMS Choice Patient states their goals for this hospitalization and ongoing recovery are:: return home CMS Medicare.gov Compare Post Acute Care list provided to:: Patient Choice offered to / list presented to : Patient [place] ownership interest in [facility].provided to:: Patient    Expected Discharge Plan and Services       Living arrangements for the past 2 months: Single Family Home                           HH Arranged: PT HH Agency: [facility] Date [facility] Agency Contacted: 07/28/23 Time HH Agency Contacted: [phone] Representative spoke with at [facility] Residence Agency: Haywood Lasso  Prior Living Arrangements/Services Living arrangements for the past 2 months: Single Family Home Lives with:: Self Patient language and need for interpreter reviewed:: Yes        Need for Family Participation in Patient Care: Yes (Comment) Care giver support system in place?: Yes (comment) Current home services: DME (Rollator) Criminal Activity/Legal Involvement Pertinent to Current Situation/Hospitalization: No - Comment  as needed  Activities of Daily Living   ADL Screening (condition at time of admission) Independently performs ADLs?: No Does the patient have a NEW difficulty with bathing/dressing/toileting/self-feeding that is expected to last >3 days?: No Does the patient have a NEW difficulty with getting in/out of bed, walking, or climbing stairs that is expected to last >3 days?: No Does the patient have a NEW difficulty with communication that is expected to last >3 days?: No Is the patient deaf or have difficulty hearing?: No Does the patient have difficulty seeing, even when wearing glasses/contacts?: No Does the patient have difficulty concentrating, remembering, or making decisions?: No  Permission Sought/Granted                  Emotional Assessment Appearance:: Appears stated age Attitude/Demeanor/Rapport: Gracious Affect (typically observed): Accepting Orientation: : Oriented to Self, Oriented to Place, Oriented to  Time, Oriented to Situation Alcohol / Substance Use: Not Applicable Psych Involvement: No (comment)  Admission diagnosis:  Failure to thrive in adult [R62.7] Patient Active Problem List   Diagnosis Date Noted   DVT (deep venous thrombosis) (HCC) 04/10/2023   Dislodged gastrostomy tube 04/10/2023   Hypotension 03/22/2023   Metabolic acidosis 03/22/2023   Bowel perforation (HCC) 03/21/2023   Pressure injury of skin 01/29/2023   Esophageal dysphagia 01/15/2023   Abnormal esophagram 01/15/2023   Hypoglycemia 01/15/2023   Hypernatremia 01/08/2023   Severe dehydration 01/08/2023   Altered mental status 01/08/2023  Failure to thrive in adult 01/08/2023   AKI (acute kidney injury) (HCC) 01/08/2023   Gallbladder dilatation 01/08/2023   SIRS (systemic inflammatory response syndrome) (HCC) 11/09/2022   Sepsis (HCC) 11/09/2022   Acute metabolic encephalopathy 11/09/2022   Renal insufficiency 11/09/2022   Hypokalemia 11/09/2022   Normocytic anemia 11/09/2022   Morbid  obesity with BMI of 50.0-59.9, adult (HCC) 09/14/2022   Gastric bypass status for obesity 09/14/2022   Intermittent exophthalmos of right eye 06/09/2022   Gastric polyp 04/08/2022   Hyperlipidemia 05/20/2021   Lumbar radiculopathy 12/24/2020   Trigger little finger of left hand 12/16/2020   Medial epicondylitis of elbow, left 12/16/2020   Memory difficulties 12/11/2020   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   Globus sensation 11/15/2019   Gastroesophageal reflux disease 11/15/2019   Laryngopharyngeal reflux (LPR) 11/15/2019   Throat clearing 10/05/2019   Gout 08/31/2019   Phlebitis 08/31/2019   Prediabetes 08/31/2019   OA (osteoarthritis) of knee 05/17/2019   Acute pain of right knee 05/17/2019   Abdominal pain 03/31/2019   Morbid obesity (HCC) 03/29/2018   GAD (generalized anxiety disorder) 02/27/2018   Mixed incontinence urge and stress 02/27/2018   OSA on CPAP 06/16/2017   Acute appendicitis 05/06/2016   Gastro-esophageal reflux disease with esophagitis 07/26/2011   Essential hypertension 07/26/2011   Osteoarthrosis 10/16/2009   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 [place] [address] Phone: [phone] Fax: [phone]  [id number] - [place], [place] - [address] AT [facility] [address] [facility] [address] [place] [address] Phone: [phone] Fax: [phone]     Social Drivers of Health (SDOH) Social History: SDOH Screenings   Food Insecurity: Food Insecurity Present (07/27/2023)  Housing: Low Risk  (07/27/2023)  Transportation Needs: Unmet Transportation Needs (07/27/2023)  Utilities: Not At Risk (07/27/2023)  Alcohol Screen: Low Risk  (06/24/2023)   Depression (PHQ2-9): Medium Risk (06/01/2023)  Financial Resource Strain: Low Risk  (06/24/2023)  Physical Activity: Insufficiently Active (06/24/2023)  Social Connections: Moderately Isolated (07/27/2023)  Stress: No Stress Concern Present (06/24/2023)  Recent Concern: Stress - Stress Concern Present (06/01/2023)  Tobacco Use: Medium Risk (07/27/2023)  Health Literacy: Medium Risk (12/01/2022)   Received from [facility]   SDOH Interventions:     Readmission Risk Interventions    07/28/2023   12:55 PM 04/22/2023    2:07 PM 03/23/2023   10:43 AM  Readmission Risk Prevention Plan  Transportation Screening Complete Complete Complete  Medication Review [employer]) Complete Complete Complete  PCP or Specialist appointment within 3-5 days of discharge Complete  Complete  HRI or Home Care Consult Complete Complete Complete  SW Recovery Care/Counseling Consult Complete Complete Complete  Palliative Care Screening Not Applicable Not Applicable Not Applicable  Skilled Nursing Facility Not Applicable Complete Complete

## 2023-07-28 NOTE — Progress Notes (Signed)
 Initial Nutrition Assessment  INTERVENTION:   Monitor magnesium, potassium, and phosphorus for at least 3 days, MD to replete as needed, as pt is at risk for refeeding syndrome. -Recommend Thiamine 100 mg daily for 7 days   -TPN management per Pharmacy -Daily weights while on TPN  -MD ordered vitamin labs, results pending  NUTRITION DIAGNOSIS:   Severe Malnutrition related to chronic illness, altered GI function as evidenced by energy intake < or equal to 75% for > or equal to 1 month, percent weight loss.  GOAL:   Patient will meet greater than or equal to 90% of their needs  MONITOR:   PO intake, Supplement acceptance, Labs (TPN)  REASON FOR ASSESSMENT:   Consult New TPN/TNA  ASSESSMENT:   73 y.o. female admitted with FTT, abdominal pain, inablilty to tolerate solids. Plan is for 2 weeks of TPN in prepartion for gastric bypass reversal. 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  Patient in room, sipping on some tea, really wants some more hot tea. Pt reports trying some soup but didn't eat much of this. POs are mostly for comfort at this time. Pt reports she hasn't been able to eat much at home and has struggled to maintain her nutrition status since having gastric bypass surgery in May 2024. Status worsened even more following ulcer surgery in November 2024. Pt had a g-tube for nutrition but this fell out in January 2025.  Surgery now recommending 2 weeks of parenteral nutrition in preparation for gastric bypass reversal.  Pt with history of multiple vitamin/mineral deficiencies, MD has ordered vitamin labs for this admission.  Pt states she was taking a bariatric MVI at home.   TPN to begin this evening, at 40 ml/hr, providing 1132 kcals and 48g protein.   Per weight records, pt has lost 47 lbs since 04/06/23 (20% wt loss x 3.5 months, significant for time frame). Weight loss likely a combination of surgery and malnutrition.  Vitamin  labs pending: -Vit D -Vit A -Zinc -Copper -Vit E -Vit K -Thiamine  Medications: Colace, Thiamine, IV Mg sulfate, Zofran, KCl  Labs reviewed: Vitamin B-12 WNL   NUTRITION - FOCUSED PHYSICAL EXAM:  No depletions noted  Diet Order:   Diet Order             Diet bariatric full liquid Fluid consistency: Thin  Diet effective now                   EDUCATION NEEDS:   Education needs have been addressed  Skin:  Skin Assessment: Skin Integrity Issues: Skin Integrity Issues:: Stage II Stage II: sacrum  Last BM:  4/2  Height:   Ht Readings from Last 1 Encounters:  07/27/23 5\' 1"  (1.549 m)    Weight:   Wt Readings from Last 1 Encounters:  07/28/23 84.1 kg    BMI:  Body mass index is 35.03 kg/m.  Estimated Nutritional Needs:   Kcal:  1500-1800  Protein:  75-90g  Fluid:  1.8L/day   Tilda Franco, MS, RD, LDN Inpatient Clinical Dietitian Contact via Secure chat

## 2023-07-28 NOTE — H&P (Deleted)
 Progress Note: General Surgery Service   Chief Complaint/Subjective: Doing okay today, little better than yesterday  Objective: Vital signs in last 24 hours: Temp:  [97.4 F (36.3 C)-98.4 F (36.9 C)] 98.3 F (36.8 C) (04/03 0547) Pulse Rate:  [55-80] 70 (04/03 0547) Resp:  [12-18] 15 (04/03 0547) BP: (126-159)/(57-92) 154/75 (04/03 0547) SpO2:  [100 %] 100 % (04/03 0547) Weight:  [82.2 kg-84.1 kg] 84.1 kg (04/03 0500) Last BM Date : 07/27/23  Intake/Output from previous day: 04/02 0701 - 04/03 0700 In: 180 [P.O.:180] Out: -  Intake/Output this shift: No intake/output data recorded.  GI: Abd Dressing over umbilical site that has been draining, soft, nontender  Lab Results: CBC  Recent Labs    07/27/23 1902 07/28/23 0425  WBC 4.0 3.9*  HGB 9.7* 9.3*  HCT 31.7* 30.3*  PLT 215 199   BMET Recent Labs    07/27/23 1902 07/28/23 0425  NA  --  144  K  --  3.1*  CL  --  118*  CO2  --  21*  GLUCOSE  --  73  BUN  --  26*  CREATININE 0.88 0.80  CALCIUM  --  8.3*   PT/INR No results for input(s): "LABPROT", "INR" in the last 72 hours. ABG No results for input(s): "PHART", "HCO3" in the last 72 hours.  Invalid input(s): "PCO2", "PO2"  Anti-infectives: Anti-infectives (From admission, onward)    None       Medications: Scheduled Meds:  acetaminophen  650 mg Oral Q6H   Chlorhexidine Gluconate Cloth  6 each Topical Daily   docusate sodium  100 mg Oral BID   enoxaparin (LOVENOX) injection  40 mg Subcutaneous Q24H   Ensure Max Protein  11 oz Oral Daily   thiamine (VITAMIN B1) injection  100 mg Intravenous Daily   Continuous Infusions:  magnesium sulfate bolus IVPB     potassium chloride     PRN Meds:.ondansetron **OR** ondansetron (ZOFRAN) IV, promethazine, simethicone, sodium chloride flush  Assessment/Plan: Christine [name] has had a terrible year after a gastric bypass.  She continues to have failure to thrive and now is now tolerating solid foods.  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  At this point she is unable to walk steadily, she feels very weak, and I am concerned for severe malnutrition and failure to thrive.  We discussed options moving forward.  I recommend robotic reversal of her gastric bypass.  I recommend direct admission to [facility] for 2 weeks of preoperative TPN in preparation for this larger operation.  We discussed the procedure itself as well as its risk, benefits, and alternatives.  After full discussion all questions answered the patient is very enthusiastic about having her gastric bypass reversed as she has been so miserable for the last year.    Severe protein calorie malnutrition - albumin and prealbumin low on labs yesterday, initiate TPN Hypokalemia - replace today Hypomagnesemia - replace today Thiamine level pending, being replaced prior to TPN initiation Umbilical drainage - Nystatin powder and local care with daily bandages Failure to thrive - TPN and full liquid diet for comfort, plan for 2 weeks of nutrition then robotic reversal of gastric bypass    LOS: 1 day    Quentin Ore, MD  [facility] Use [email or web address] to contact on call provider  Daily Billing: 29562 - Moderate MDM

## 2023-07-28 NOTE — Evaluation (Addendum)
 Physical Therapy Evaluation Patient Details Name: Christine [name] MRN: [id number] DOB: 1950/10/02 Today's Date: 07/28/2023  History of Present Illness  73 y.o. female admitted with FTT, abdominal pain, inablilty to tolerate solids. Plan is for 2 weeks of TPN in prepartion for gastric bypass reversal. 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  Clinical Impression  Pt admitted with above diagnosis. Pt ambulated 48' with rollator, no loss of balance, distance limited by fatigue. Pt performed seated BLE strengthening exercises. Pt reports numerous falls at home in the past 6 months.   Pt currently with functional limitations due to the deficits listed below (see PT Problem List). Pt will benefit from acute skilled PT to increase their independence and safety with mobility to allow discharge.           If plan is discharge home, recommend the following: A little help with bathing/dressing/bathroom;Assist for transportation;Assistance with cooking/housework;Help with stairs or ramp for entrance   Can travel by private vehicle        Equipment Recommendations None recommended by PT  Recommendations for Other Services       Functional Status Assessment Patient has had a recent decline in their functional status and demonstrates the ability to make significant improvements in function in a reasonable and predictable amount of time.     Precautions / Restrictions Precautions Precautions: Fall Recall of Precautions/Restrictions: Intact Precaution/Restrictions Comments: pt reports multiple falls in past 6 months Restrictions Weight Bearing Restrictions Per Provider Order: No      Mobility  Bed Mobility Overal bed mobility: Modified Independent, Needs Assistance Bed Mobility: Supine to Sit, Sit to Supine     Supine to sit: Modified independent (Device/Increase time), HOB elevated, Used rails Sit to supine: Min assist, Used rails, HOB elevated    General bed mobility comments: HOB up, used rails; assist for BLEs into bed    Transfers Overall transfer level: Needs assistance Equipment used: Rollator (4 wheels) Transfers: Sit to/from Stand Sit to Stand: Contact guard assist           General transfer comment: safe hand placement    Ambulation/Gait Ambulation/Gait assistance: Contact guard assist Gait Distance (Feet): 70 Feet Assistive device: Rolling walker (2 wheels) Gait Pattern/deviations: Step-through pattern, Decreased stride length Gait velocity: decr     General Gait Details: steady, no loss of balance, distance limited by fatigue  Stairs            Wheelchair Mobility     Tilt Bed    Modified Rankin (Stroke Patients Only)       Balance Overall balance assessment: Needs assistance, History of Falls Sitting-balance support: No upper extremity supported, Feet supported Sitting balance-Leahy Scale: Good     Standing balance support: Bilateral upper extremity supported, During functional activity, Reliant on assistive device for balance Standing balance-Leahy Scale: Poor                               Pertinent Vitals/Pain Pain Assessment Pain Assessment: No/denies pain    Home Living Family/patient expects to be discharged to:: Private residence Living Arrangements: Alone     Home Access: Level entry       Home Layout: One level Home Equipment: Cane - single point;Rollator (4 wheels)      Prior Function Prior Level of Function : Needs assist;History of Falls (last six months)  Mobility Comments: hx of multiple falls, "too many" when asked how many; reports she walks with rollator at home ADLs Comments: sponge bathes     Extremity/Trunk Assessment        Lower Extremity Assessment Lower Extremity Assessment: Overall WFL for tasks assessed    Cervical / Trunk Assessment Cervical / Trunk Assessment: Normal  Communication    Communication Communication: No apparent difficulties    Cognition Arousal: Alert Behavior During Therapy: WFL for tasks assessed/performed   PT - Cognitive impairments: No apparent impairments                         Following commands: Intact       Cueing       General Comments      Exercises General Exercises - Lower Extremity Long Arc Quad: AROM, Both, 10 reps, Seated   Assessment/Plan    PT Assessment Patient needs continued PT services  PT Problem List Decreased activity tolerance;Decreased balance;Decreased mobility       PT Treatment Interventions Gait training;DME instruction;Patient/family education;Therapeutic exercise    PT Goals (Current goals can be found in the Care Plan section)  Acute Rehab PT Goals Patient Stated Goal: likes to sleep and watch tv PT Goal Formulation: With patient Time For Goal Achievement: 08/11/23 Potential to Achieve Goals: Good    Frequency Min 3X/week     Co-evaluation               AM-PAC PT "6 Clicks" Mobility  Outcome Measure Help needed turning from your back to your side while in a flat bed without using bedrails?: A Little Help needed moving from lying on your back to sitting on the side of a flat bed without using bedrails?: A Little Help needed moving to and from a bed to a chair (including a wheelchair)?: A Little Help needed standing up from a chair using your arms (e.g., wheelchair or bedside chair)?: A Little Help needed to walk in hospital room?: None Help needed climbing 3-5 steps with a railing? : A Lot 6 Click Score: 18    End of Session Equipment Utilized During Treatment: Gait belt Activity Tolerance: Patient tolerated treatment well Patient left: in bed;with bed alarm set Nurse Communication: Mobility status PT Visit Diagnosis: Difficulty in walking, not elsewhere classified (R26.2);History of falling (Z91.81)    Time: 0946-1000 PT Time Calculation (min) (ACUTE ONLY): 14  min   Charges:   PT Evaluation $PT Eval Moderate Complexity: 1 Mod   PT General Charges $$ ACUTE PT VISIT: 1 Visit         Tamala Ser PT 07/28/2023  Acute Rehabilitation Services  Office [phone]

## 2023-07-28 NOTE — Evaluation (Signed)
 Occupational Therapy Evaluation Patient Details Name: Christine [name] MRN: [id number] DOB: March 27, 1951 Today's Date: 07/28/2023   History of Present Illness   73 y.o. female admitted with FTT, abdominal pain, inablilty to tolerate solids. Plan is for 2 weeks of TPN in prepartion for gastric bypass reversal. 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     Clinical Impressions PTA pt lives at home alone and has a housekeeper who assist with IADL tasks as well as emptying her BSC every 3-4 days as pt was not walking into the bathroom due to fatigue/weakness. Pt agreeable to OT however session limited due to general c/o fatigue. Discussed importance of mobility and increasing her independence with ADL as pt's goal is to return home. Recommend HHOT after DC to maximize independence with IADL tasks and reduce risk of falls.  Acute OT to follow.      If plan is discharge home, recommend the following:   A little help with bathing/dressing/bathroom;Assistance with cooking/housework;Assist for transportation     Functional Status Assessment   Patient has had a recent decline in their functional status and demonstrates the ability to make significant improvements in function in a reasonable and predictable amount of time.     Equipment Recommendations   None recommended by OT     Recommendations for Other Services         Precautions/Restrictions   Precautions Precautions: Fall Recall of Precautions/Restrictions: Intact Precaution/Restrictions Comments: pt reports multiple falls in past 6 months Restrictions Weight Bearing Restrictions Per Provider Order: No     Mobility Bed Mobility Overal bed mobility: Modified Independent, Needs Assistance Bed Mobility: Supine to Sit, Sit to Supine     Supine to sit: Modified independent (Device/Increase time), HOB elevated, Used rails Sit to supine: Min assist, Used rails, HOB elevated    General bed mobility comments: HOB up, used rails; assist for BLEs into bed    Transfers Overall transfer level: Needs assistance Equipment used: Rollator (4 wheels) Transfers: Sit to/from Stand Sit to Stand: Supervision                  Balance Overall balance assessment: Needs assistance Sitting-balance support: No upper extremity supported, Feet supported Sitting balance-Leahy Scale: Good     Standing balance support: Bilateral upper extremity supported, During functional activity, Reliant on assistive device for balance Standing balance-Leahy Scale: Poor                             ADL either performed or assessed with clinical judgement   ADL Overall ADL's : Needs assistance/impaired   Eating/Feeding Details (indicate cue type and reason): TPN x 2 weeks Grooming: Set up;Sitting   Upper Body Bathing: Set up;Sitting   Lower Body Bathing: Contact guard assist;Sit to/from stand   Upper Body Dressing : Set up;Sitting   Lower Body Dressing: Contact guard assist;Sit to/from stand   Toilet Transfer: Supervision/safety;Ambulation   Toileting- Clothing Manipulation and Hygiene: Supervision/safety       Functional mobility during ADLs: Supervision/safety May benefit from AE       Vision Baseline Vision/History: 1 Wears glasses       Perception         Praxis         Pertinent Vitals/Pain       Extremity/Trunk Assessment Upper Extremity Assessment Upper Extremity Assessment: Right hand dominant   Lower Extremity Assessment Lower Extremity Assessment: Defer to PT evaluation  Cervical / Trunk Assessment Cervical / Trunk Assessment: Normal   Communication Communication Communication: No apparent difficulties   Cognition Arousal: Alert Behavior During Therapy: WFL for tasks assessed/performed Cognition: No apparent impairments                               Following commands: Intact       Cueing  General  Comments          Exercises     Shoulder Instructions      Home Living Family/patient expects to be discharged to:: Private residence Living Arrangements: Alone Available Help at Discharge: Neighbor;Available PRN/intermittently;Other (Comment) (housekeeper who can assist at DC "a little") Type of Home: House Home Access: Level entry     Home Layout: One level               Home Equipment: Cane - single point;Rollator (4 wheels)          Prior Functioning/Environment Prior Level of Function : Needs assist;History of Falls (last six months)             Mobility Comments: hx of multiple falls, "too many" when asked how many; reports she walks with rollator at home ADLs Comments: sponge bathes/uses wipes; uses BSC by her bed adn her housekeeper emptimes it every 3-4 days; housekeeper does cleaning; pt does not cook much due to vomiting/difficulty looking at food    OT Problem List: Decreased strength;Decreased activity tolerance;Impaired balance (sitting and/or standing);Decreased knowledge of use of DME or AE   OT Treatment/Interventions: Self-care/ADL training;Therapeutic exercise;Energy conservation;DME and/or AE instruction;Therapeutic activities;Patient/family education;Balance training      OT Goals(Current goals can be found in the care plan section)   Acute Rehab OT Goals Patient Stated Goal: to rest OT Goal Formulation: With patient Time For Goal Achievement: 08/11/23 Potential to Achieve Goals: Good   OT Frequency:  Min 2X/week    Co-evaluation              AM-PAC OT "6 Clicks" Daily Activity     Outcome Measure Help from another person eating meals?: None (however on TPN) Help from another person taking care of personal grooming?: A Little Help from another person toileting, which includes using toliet, bedpan, or urinal?: A Little Help from another person bathing (including washing, rinsing, drying)?: A Little Help from another person to  put on and taking off regular upper body clothing?: A Little Help from another person to put on and taking off regular lower body clothing?: A Little 6 Click Score: 19   End of Session Equipment Utilized During Treatment: Rollator (4 wheels) Nurse Communication: Mobility status  Activity Tolerance: Patient limited by fatigue Patient left: in bed;with call bell/phone within reach;with bed alarm set  OT Visit Diagnosis: Unsteadiness on feet (R26.81);Repeated falls (R29.6);Muscle weakness (generalized) (M62.81)                Time: 1610-9604 OT Time Calculation (min): 17 min Charges:  OT General Charges $OT Visit: 1 Visit OT Evaluation $OT Eval Moderate Complexity: 1 Mod  Jacquelinne Speak, OT/L   Acute OT Clinical Specialist Acute Rehabilitation Services Pager [phone] Office [phone]   [facility] 07/28/2023, 2:25 PM

## 2023-07-29 LAB — GLUCOSE, CAPILLARY
Glucose-Capillary: 106 mg/dL — ABNORMAL HIGH (ref 70–99)
Glucose-Capillary: 115 mg/dL — ABNORMAL HIGH (ref 70–99)
Glucose-Capillary: 83 mg/dL (ref 70–99)
Glucose-Capillary: 87 mg/dL (ref 70–99)

## 2023-07-29 LAB — PHOSPHORUS: Phosphorus: 1.4 mg/dL — ABNORMAL LOW (ref 2.5–4.6)

## 2023-07-29 LAB — HEPATIC FUNCTION PANEL
ALT: 13 U/L (ref 0–44)
AST: 25 U/L (ref 15–41)
Albumin: 1.9 g/dL — ABNORMAL LOW (ref 3.5–5.0)
Alkaline Phosphatase: 57 U/L (ref 38–126)
Bilirubin, Direct: 0.2 mg/dL (ref 0.0–0.2)
Indirect Bilirubin: 0.3 mg/dL (ref 0.3–0.9)
Total Bilirubin: 0.5 mg/dL (ref 0.0–1.2)
Total Protein: 4.9 g/dL — ABNORMAL LOW (ref 6.5–8.1)

## 2023-07-29 LAB — CBC
HCT: 27.5 % — ABNORMAL LOW (ref 36.0–46.0)
Hemoglobin: 8.3 g/dL — ABNORMAL LOW (ref 12.0–15.0)
MCH: 29.1 pg (ref 26.0–34.0)
MCHC: 30.2 g/dL (ref 30.0–36.0)
MCV: 96.5 fL (ref 80.0–100.0)
Platelets: 187 10*3/uL (ref 150–400)
RBC: 2.85 MIL/uL — ABNORMAL LOW (ref 3.87–5.11)
RDW: 17.2 % — ABNORMAL HIGH (ref 11.5–15.5)
WBC: 5.5 10*3/uL (ref 4.0–10.5)
nRBC: 0.4 % — ABNORMAL HIGH (ref 0.0–0.2)

## 2023-07-29 LAB — BASIC METABOLIC PANEL WITH GFR
Anion gap: 4 — ABNORMAL LOW (ref 5–15)
BUN: 35 mg/dL — ABNORMAL HIGH (ref 8–23)
CO2: 22 mmol/L (ref 22–32)
Calcium: 7.8 mg/dL — ABNORMAL LOW (ref 8.9–10.3)
Chloride: 114 mmol/L — ABNORMAL HIGH (ref 98–111)
Creatinine, Ser: 0.8 mg/dL (ref 0.44–1.00)
GFR, Estimated: >60 mL/min (ref >60)
Glucose, Bld: 107 mg/dL — ABNORMAL HIGH (ref 70–99)
Potassium: 3.7 mmol/L (ref 3.5–5.1)
Sodium: 140 mmol/L (ref 135–145)

## 2023-07-29 LAB — MAGNESIUM: Magnesium: 2.5 mg/dL — ABNORMAL HIGH (ref 1.7–2.4)

## 2023-07-29 MED ORDER — APIXABAN 5 MG PO TABS
5.0000 mg | ORAL_TABLET | Freq: Two times a day (BID) | ORAL | Status: DC
  Administered 2023-07-29 (×2): 5 mg
  Filled 2023-07-29 (×2): qty 1

## 2023-07-29 MED ORDER — MECLIZINE HCL 25 MG PO TABS
12.5000 mg | ORAL_TABLET | Freq: Two times a day (BID) | ORAL | Status: DC
  Administered 2023-07-29 – 2023-08-23 (×50): 12.5 mg via ORAL
  Filled 2023-07-29 (×50): qty 1

## 2023-07-29 MED ORDER — TRAVASOL 10 % IV SOLN
INTRAVENOUS | Status: AC
  Filled 2023-07-29: qty 480

## 2023-07-29 MED ORDER — APIXABAN 5 MG PO TABS
5.0000 mg | ORAL_TABLET | Freq: Two times a day (BID) | ORAL | Status: AC
  Administered 2023-07-30 – 2023-08-08 (×20): 5 mg via ORAL
  Filled 2023-07-29 (×21): qty 1

## 2023-07-29 MED ORDER — POTASSIUM PHOSPHATES 15 MMOLE/5ML IV SOLN
30.0000 mmol | Freq: Once | INTRAVENOUS | Status: AC
  Administered 2023-07-29: 30 mmol via INTRAVENOUS
  Filled 2023-07-29: qty 10

## 2023-07-29 MED ORDER — PANTOPRAZOLE SODIUM 40 MG IV SOLR
40.0000 mg | Freq: Two times a day (BID) | INTRAVENOUS | Status: DC
  Administered 2023-07-29 – 2023-08-03 (×12): 40 mg via INTRAVENOUS
  Filled 2023-07-29 (×12): qty 10

## 2023-07-29 MED ORDER — SUCRALFATE 1 GM/10ML PO SUSP
1.0000 g | Freq: Three times a day (TID) | ORAL | Status: DC
  Administered 2023-07-29 – 2023-08-23 (×86): 1 g via ORAL
  Filled 2023-07-29 (×88): qty 10

## 2023-07-29 MED ORDER — CARMEX CLASSIC LIP BALM EX OINT
TOPICAL_OINTMENT | CUTANEOUS | Status: DC | PRN
  Filled 2023-07-29 (×2): qty 10

## 2023-07-29 MED ORDER — FUROSEMIDE 20 MG PO TABS
20.0000 mg | ORAL_TABLET | Freq: Every day | ORAL | Status: DC
  Administered 2023-07-29 – 2023-08-23 (×25): 20 mg via ORAL
  Filled 2023-07-29 (×25): qty 1

## 2023-07-29 NOTE — TOC Progression Note (Signed)
 Transition of Care [facility]) - Progression Note    Patient Details  Name: Christine [name] MRN: [id number] Date of Birth: Aug 08, 1950  [facility] CM/SW Contact  Howell Rucks, RN Phone Number: 07/29/2023, 3:38 PM  Clinical Narrative:  PT eval completed, recommendation for [facility] PT/OT, request sent to attending to enter order for [facility] PT/OT.     Expected Discharge Plan: Home w Home Health Services Barriers to Discharge: Continued Medical Work up  Expected Discharge Plan and Services       Living arrangements for the past 2 months: Single Family Home                           HH Arranged: PT [facility] Agency: [facility] Date HH Agency Contacted: 07/28/23 Time HH Agency Contacted: [id number] Representative spoke with at [facility] Agency: Haywood Lasso   Social Determinants of Health (SDOH) Interventions SDOH Screenings   Food Insecurity: Food Insecurity Present (07/27/2023)  Housing: Low Risk  (07/27/2023)  Transportation Needs: Unmet Transportation Needs (07/27/2023)  Utilities: Not At Risk (07/27/2023)  Alcohol Screen: Low Risk  (06/24/2023)  Depression (PHQ2-9): Medium Risk (06/01/2023)  Financial Resource Strain: Low Risk  (06/24/2023)  Physical Activity: Insufficiently Active (06/24/2023)  Social Connections: Moderately Isolated (07/27/2023)  Stress: No Stress Concern Present (06/24/2023)  Recent Concern: Stress - Stress Concern Present (06/01/2023)  Tobacco Use: Medium Risk (07/27/2023)  Health Literacy: Medium Risk (12/01/2022)   Received from [facility]    Readmission Risk Interventions    07/28/2023   12:55 PM 04/22/2023    2:07 PM 03/23/2023   10:43 AM  Readmission Risk Prevention Plan  Transportation Screening Complete Complete Complete  Medication Review [employer]) Complete Complete Complete  PCP or Specialist appointment within 3-5 days of discharge Complete  Complete  HRI or Home Care Consult Complete Complete Complete  SW Recovery Care/Counseling Consult  Complete Complete Complete  Palliative Care Screening Not Applicable Not Applicable Not Applicable  Skilled Nursing Facility Not Applicable Complete Complete

## 2023-07-29 NOTE — Progress Notes (Signed)
 Progress Note: General Surgery Service   Chief Complaint/Subjective: Tolerating some PO. Denies emesis yesterday.  + BM.  Minimal drainage from the umbilical area.   Objective: Vital signs in last 24 hours: Temp:  [97.7 F (36.5 C)-99.1 F (37.3 C)] 98.4 F (36.9 C) (04/04 0436) Pulse Rate:  [77-96] 77 (04/04 0436) Resp:  [16-18] 18 (04/04 0436) BP: (104-152)/(67-77) 104/67 (04/04 0436) SpO2:  [100 %] 100 % (04/04 0436) Last BM Date : 07/28/23  Intake/Output from previous day: 04/03 0701 - 04/04 0700 In: 2346.7 [P.O.:1437; I.V.:518.6; IV Piggyback:391.1] Out: 800 [Urine:800] Intake/Output this shift: No intake/output data recorded.  GI: Abd Dressing over umbilical site that has been draining dark output, soft, nontender  Lab Results: CBC  Recent Labs    07/28/23 0425 07/29/23 0229  WBC 3.9* 5.5  HGB 9.3* 8.3*  HCT 30.3* 27.5*  PLT 199 187   BMET Recent Labs    07/28/23 0425 07/29/23 0229  NA 144 140  K 3.1* 3.7  CL 118* 114*  CO2 21* 22  GLUCOSE 73 107*  BUN 26* 35*  CREATININE 0.80 0.80  CALCIUM 8.3* 7.8*   PT/INR No results for input(s): "LABPROT", "INR" in the last 72 hours. ABG No results for input(s): "PHART", "HCO3" in the last 72 hours.  Invalid input(s): "PCO2", "PO2"  Anti-infectives: Anti-infectives (From admission, onward)    None       Medications: Scheduled Meds:  acetaminophen  650 mg Oral Q6H   Chlorhexidine Gluconate Cloth  6 each Topical Daily   docusate sodium  100 mg Oral BID   enoxaparin (LOVENOX) injection  40 mg Subcutaneous Q24H   insulin aspart  0-9 Units Subcutaneous Q6H   nystatin   Topical TID   Ensure Max Protein  11 oz Oral Daily   thiamine (VITAMIN B1) injection  100 mg Intravenous Daily   Continuous Infusions:  TPN ADULT (ION) 40 mL/hr at 07/28/23 1716   PRN Meds:.alum & mag hydroxide-simeth, lip balm, ondansetron **OR** ondansetron (ZOFRAN) IV, promethazine, simethicone, sodium chloride  flush  Assessment/Plan: Christine [name] has had a terrible year after a gastric bypass and is now inpatient for TPN while awaiting possible reversal.   - Continue current diet, TPN - Will resume Eliquis - Encourage mobilization - Follow up nutrition labs   LOS: 2 days    Moise Boring, MD  [facility] Use [email or web address] to contact on call provider

## 2023-07-29 NOTE — Progress Notes (Signed)
 PHARMACY - TOTAL PARENTERAL NUTRITION CONSULT NOTE   Indication:  Intolerance to enteral feeding   Patient Measurements: Height: 5\' 1"  (154.9 cm) Weight: 84.1 kg (185 lb 6.5 oz) IBW/kg (Calculated) : 47.8 TPN AdjBW (KG): 56.4 Body mass index is 35.03 kg/m.   Assessment:  Pharmacy is consulted to start TPN on 73 yo female with PMG including gastric bypass. Pt has had failure to thrive and is not tolerating solid foods. 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 Plan to give TPN in anticipation of surgery reversal.   Glucose / Insulin:  - no history of DM  - CBG at goal ( CBG <180)  Electrolytes:  - K+ no normal at 3.7   - Magnesium 2.5, high  - Phosphorus dropped from 3.4 to 1.4 - Others are WNL, including CorrCa at 9.5  Renal:  - Scr <1  - BUN 35 Hepatic:  - LFTs WNL  - TBili 0.5 Intake / Output; MIVF:  - I/O likely not fully charted  - Not on IVF  GI Imaging: GI Surgeries / Procedures:   Central access: 07/27/23 TPN start date: 07/28/23   Nutritional Goals: Goal TPN rate is 65 mL/hr (provides 78 g of protein and 1787 kcals per day)  RD Assessment: on 4/3 Estimated Needs Total Energy Estimated Needs: 1500-1800 Total Protein Estimated Needs: 75-90g Total Fluid Estimated Needs: 1.8L/day  Current Nutrition:  - Bariatric diet   Plan:  Now - Potassium phospate 30 mmol x 1    Keep  TPN at 40 mL/hr at 1800 Electrolytes in TPN:  Na 72mEq/L K 41mEq/L Ca 58mEq/L Mg  to 0 mEq/L Phos to 20 mmol/L.  Cl:Ac 1:1 Add standard MVI and trace elements to TPN Continue  Sensitive q6h SSI and adjust as needed  Not on any IVF  Thiamine 100 mg IV daily x 5 through 4/6  BMP, magnesium, phosphorus with AM labs  Monitor TPN labs on Mon/Thurs    Adalberto Cole, PharmD, BCPS 07/29/2023 10:52 AM

## 2023-07-29 NOTE — Progress Notes (Signed)
 Mobility Specialist - Progress Note   07/29/23 1203  Mobility  Activity Ambulated with assistance in hallway  Level of Assistance Contact guard assist, steadying assist  Assistive Device Four wheel walker (personal rollator)  Distance Ambulated (ft) 130 ft  Range of Motion/Exercises Active  Activity Response Tolerated well  Mobility Referral Yes  Mobility visit 1 Mobility  Mobility Specialist Start Time (ACUTE ONLY) 1153  Mobility Specialist Stop Time (ACUTE ONLY) 1203  Mobility Specialist Time Calculation (min) (ACUTE ONLY) 10 min   Pt was found on recliner chair and agreeable to ambulate. Grew fatigued with session and at EOS returned to recliner chair with all needs met. Call bell in reach.  Billey Chang Mobility Specialist

## 2023-07-29 NOTE — Plan of Care (Signed)
   Problem: Nutrition: Goal: Adequate nutrition will be maintained Outcome: Progressing

## 2023-07-29 NOTE — Plan of Care (Signed)

## 2023-07-29 NOTE — Progress Notes (Signed)
 Occupational Therapy Treatment Patient Details Name: Christine [name] MRN: [id number] DOB: 1950-09-02 Today's Date: 07/29/2023   History of present illness 73 y.o. female admitted with FTT, abdominal pain, inablilty to tolerate solids. Plan is for 2 weeks of TPN in prepartion for gastric bypass reversal. 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   OT comments  Patient seen for skilled OT session this afternoon and was open to all activity presented. Patient reports being up in recliner earlier and participated in session at EOB. OT provided handouts for basic ECT introduction and carryover for LB self care this session. B UE therex initiated as outlined below with no pain reported or SOB demonstrated with O2 sats 100% on RA assessed x 3 intervals up in sitting. Patient encouraged for light HEP and OOB as much as able to ensure continued progression of strength while hospitalized. Patient continues to require Acute level OT services and will benefit from [facility] upon discharge when stable.       If plan is discharge home, recommend the following:  A little help with bathing/dressing/bathroom;Assistance with cooking/housework;Assist for transportation   Equipment Recommendations  None recommended by OT       Precautions / Restrictions Precautions Precautions: Fall Recall of Precautions/Restrictions: Intact Precaution/Restrictions Comments: pt reports multiple falls in past 6 months Restrictions Weight Bearing Restrictions Per Provider Order: No       Mobility Bed Mobility Overal bed mobility: Modified Independent, Needs Assistance Bed Mobility: Rolling, Supine to Sit, Sit to Supine Rolling: Modified independent (Device/Increase time)   Supine to sit: Modified independent (Device/Increase time), HOB elevated, Used rails Sit to supine: Contact guard assist   General bed mobility comments: HOB up, used rails; assist for BLEs into bed         Balance Overall balance assessment: Needs assistance Sitting-balance support: No upper extremity supported, Feet supported Sitting balance-Leahy Scale: Good                                     ADL either performed or assessed with clinical judgement   ADL               Lower Body Bathing: Contact guard assist;Sit to/from stand (educated on ECT and strategies for functional reach to LE's for bathing and lotion application)   Upper Body Dressing : Sitting;Standing                          Extremity/Trunk Assessment Upper Extremity Assessment Upper Extremity Assessment: Right hand dominant   Lower Extremity Assessment Lower Extremity Assessment: Generalized weakness        Communication Communication Communication: No apparent difficulties   Cognition Arousal: Alert Behavior During Therapy: WFL for tasks assessed/performed Cognition: No apparent impairments                               Following commands: Intact        Cueing      Exercises Exercises: General Upper Extremity (B light yellow L1 tband for scap, shoulder, and elbow extension 10 reps x 3 sets with written handout as well as scap retraction and sh elevation and foam grasp ball squeezes with min cues for technique for carrover between visits)    Shoulder Instructions       General Comments patient with significant  dry skin on lower legs, educated on strategies for lotion application and functional reach    Pertinent Vitals/ Pain       Pain Assessment Pain Assessment: No/denies pain   Frequency  Min 2X/week        Progress Toward Goals  OT Goals(current goals can now be found in the care plan section)  Progress towards OT goals: Progressing toward goals  Acute Rehab OT Goals Patient Stated Goal: to progress strength OT Goal Formulation: With patient Time For Goal Achievement: 08/11/23 Potential to Achieve Goals: Good ADL Goals Pt Will Perform  Lower Body Bathing: with modified independence;with adaptive equipment;sit to/from stand Pt Will Perform Lower Body Dressing: with modified independence;sit to/from stand;with adaptive equipment Pt Will Transfer to Toilet: with modified independence;ambulating Additional ADL Goal #1: Pt will independently identify 3 strategies to reduce risk of falls Additional ADL Goal #2: Pt will independently identify 3 energy conservation techniques to increase independence and endurance for ADL/IADL tasks   AM-PAC OT "6 Clicks" Daily Activity     Outcome Measure   Help from another person eating meals?: None Help from another person taking care of personal grooming?: A Little Help from another person toileting, which includes using toliet, bedpan, or urinal?: A Little Help from another person bathing (including washing, rinsing, drying)?: A Little Help from another person to put on and taking off regular upper body clothing?: A Little Help from another person to put on and taking off regular lower body clothing?: A Little 6 Click Score: 19    End of Session    OT Visit Diagnosis: Unsteadiness on feet (R26.81);Repeated falls (R29.6);Muscle weakness (generalized) (M62.81)   Activity Tolerance Patient limited by fatigue   Patient Left in bed;with call bell/phone within reach;with bed alarm set   Nurse Communication Mobility status        Time: 1330-1405 OT Time Calculation (min): 35 min  Charges: OT General Charges $OT Visit: 1 Visit OT Treatments $Therapeutic Exercise: 23-37 mins  Mihika Surrette OT/L Acute Rehabilitation Department  [phone]  07/29/2023, 3:41 PM

## 2023-07-30 DIAGNOSIS — E43 Unspecified severe protein-calorie malnutrition: Secondary | ICD-10-CM | POA: Insufficient documentation

## 2023-07-30 LAB — CBC
HCT: 25.7 % — ABNORMAL LOW (ref 36.0–46.0)
Hemoglobin: 7.6 g/dL — ABNORMAL LOW (ref 12.0–15.0)
MCH: 29 pg (ref 26.0–34.0)
MCHC: 29.6 g/dL — ABNORMAL LOW (ref 30.0–36.0)
MCV: 98.1 fL (ref 80.0–100.0)
Platelets: 145 10*3/uL — ABNORMAL LOW (ref 150–400)
RBC: 2.62 MIL/uL — ABNORMAL LOW (ref 3.87–5.11)
RDW: 17 % — ABNORMAL HIGH (ref 11.5–15.5)
WBC: 4.4 10*3/uL (ref 4.0–10.5)
nRBC: 0 % (ref 0.0–0.2)

## 2023-07-30 LAB — BASIC METABOLIC PANEL WITH GFR
Anion gap: 5 (ref 5–15)
BUN: 29 mg/dL — ABNORMAL HIGH (ref 8–23)
CO2: 20 mmol/L — ABNORMAL LOW (ref 22–32)
Calcium: 8.1 mg/dL — ABNORMAL LOW (ref 8.9–10.3)
Chloride: 113 mmol/L — ABNORMAL HIGH (ref 98–111)
Creatinine, Ser: 0.72 mg/dL (ref 0.44–1.00)
GFR, Estimated: >60 mL/min (ref >60)
Glucose, Bld: 81 mg/dL (ref 70–99)
Potassium: 4.3 mmol/L (ref 3.5–5.1)
Sodium: 138 mmol/L (ref 135–145)

## 2023-07-30 LAB — HEPATIC FUNCTION PANEL
ALT: 12 U/L (ref 0–44)
AST: 26 U/L (ref 15–41)
Albumin: 2 g/dL — ABNORMAL LOW (ref 3.5–5.0)
Alkaline Phosphatase: 55 U/L (ref 38–126)
Bilirubin, Direct: 0.1 mg/dL (ref 0.0–0.2)
Indirect Bilirubin: 0.6 mg/dL (ref 0.3–0.9)
Total Bilirubin: 0.7 mg/dL (ref 0.0–1.2)
Total Protein: 4.8 g/dL — ABNORMAL LOW (ref 6.5–8.1)

## 2023-07-30 LAB — PHOSPHORUS: Phosphorus: 4.1 mg/dL (ref 2.5–4.6)

## 2023-07-30 LAB — GLUCOSE, CAPILLARY
Glucose-Capillary: 74 mg/dL (ref 70–99)
Glucose-Capillary: 76 mg/dL (ref 70–99)
Glucose-Capillary: 86 mg/dL (ref 70–99)
Glucose-Capillary: 97 mg/dL (ref 70–99)

## 2023-07-30 LAB — MAGNESIUM: Magnesium: 1.8 mg/dL (ref 1.7–2.4)

## 2023-07-30 MED ORDER — TRAVASOL 10 % IV SOLN
INTRAVENOUS | Status: AC
  Filled 2023-07-30: qty 780

## 2023-07-30 NOTE — Progress Notes (Addendum)
 Progress Note: General Surgery Service   Chief Complaint/Subjective:  No complaints. Denies pain.  Taking in some tea.    Objective: Vital signs in last 24 hours: Temp:  [97.8 F (36.6 C)-98.6 F (37 C)] 98.6 F (37 C) (04/05 0546) Pulse Rate:  [81-85] 81 (04/05 0546) Resp:  [15-16] 16 (04/05 0546) BP: (126-145)/(72-88) 145/88 (04/05 0546) SpO2:  [100 %] 100 % (04/05 0546) Weight:  [86.9 kg] 86.9 kg (04/05 0522) Last BM Date : 07/27/23  Intake/Output from previous day: 04/04 0701 - 04/05 0700 In: 2251 [P.O.:1950; I.V.:301] Out: 1200 [Urine:1200] Intake/Output this shift: Total I/O In: 120 [P.O.:120] Out: 100 [Urine:100]  Gen:  was walking in halls earlier.   A&O OOB in chair Resp: breathign comfortably GI: soft, non distended, non tender.   Lab Results: CBC  Recent Labs    07/29/23 0229 07/30/23 0342  WBC 5.5 4.4  HGB 8.3* 7.6*  HCT 27.5* 25.7*  PLT 187 145*   BMET Recent Labs    07/29/23 0229 07/30/23 0342  NA 140 138  K 3.7 4.3  CL 114* 113*  CO2 22 20*  GLUCOSE 107* 81  BUN 35* 29*  CREATININE 0.80 0.72  CALCIUM 7.8* 8.1*   PT/INR No results for input(s): "LABPROT", "INR" in the last 72 hours. ABG No results for input(s): "PHART", "HCO3" in the last 72 hours.  Invalid input(s): "PCO2", "PO2"  Anti-infectives: Anti-infectives (From admission, onward)    None       Medications: Scheduled Meds:  acetaminophen  650 mg Oral Q6H   apixaban  5 mg Oral BID   Chlorhexidine Gluconate Cloth  6 each Topical Daily   docusate sodium  100 mg Oral BID   furosemide  20 mg Oral Daily   insulin aspart  0-9 Units Subcutaneous Q6H   meclizine  12.5 mg Oral BID   nystatin   Topical TID   pantoprazole (PROTONIX) IV  40 mg Intravenous Q12H   Ensure Max Protein  11 oz Oral Daily   sucralfate  1 g Oral TID WC & HS   thiamine (VITAMIN B1) injection  100 mg Intravenous Daily   Continuous Infusions:  TPN ADULT (ION) 40 mL/hr at 07/29/23 1712   PRN  Meds:.alum & mag hydroxide-simeth, lip balm, ondansetron **OR** ondansetron (ZOFRAN) IV, promethazine, simethicone, sodium chloride flush  Assessment/Plan: Ms. Chea has had a terrible year after a gastric bypass and is now inpatient for TPN while awaiting possible reversal.  Failure to trive Severe protein calorie malnutrition.  - bariatric clears, TPN - Will resume Eliquis - Encourage mobilization - Follow up nutrition labs Anemia of chronic disease.   LOS: 3 days    Almond Lint, MD  [facility] Use [email or web address] to contact on call provider

## 2023-07-30 NOTE — Plan of Care (Signed)

## 2023-07-30 NOTE — Progress Notes (Signed)
 Mobility Specialist - Progress Note   07/30/23 0910  Mobility  Activity Ambulated with assistance in hallway  Level of Assistance Standby assist, set-up cues, supervision of patient - no hands on  Assistive Device Front wheel walker  Distance Ambulated (ft) 160 ft  Activity Response Tolerated well  Mobility Referral Yes  Mobility visit 1 Mobility  Mobility Specialist Start Time (ACUTE ONLY) 0857  Mobility Specialist Stop Time (ACUTE ONLY) 0909  Mobility Specialist Time Calculation (min) (ACUTE ONLY) 12 min   Pt received in recliner and agreeable to mobility. Pt took x1 seated rest break d/t fatigue. No complaints during session. Pt to recliner after session with all needs met.    [facility]

## 2023-07-30 NOTE — Progress Notes (Signed)
 PHARMACY - TOTAL PARENTERAL NUTRITION CONSULT NOTE   Indication:  Intolerance to enteral feeding   Patient Measurements: Height: 5\' 1"  (154.9 cm) Weight: 86.9 kg (191 lb 9.3 oz) IBW/kg (Calculated) : 47.8 TPN AdjBW (KG): 56.4 Body mass index is 36.2 kg/m.   Assessment:  Pharmacy is consulted to start TPN on 73 yo female with PMG including gastric bypass. Pt has had failure to thrive and is not tolerating solid foods. 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 Plan to give TPN in anticipation of surgery reversal.   Glucose / Insulin:  - no history of DM  - CBG at goal ( CBG <180)  - No insulin given  Electrolytes:  - Magnesium 1.8 - Phosphorus improved to WNL after dose of potassium phosphate 30 mmol on 4/4  - Others are WNL, including CorrCa at 9.7  Renal:  - Scr <1  - BUN 29 Hepatic:  - LFTs WNL  - TBili <1 Intake / Output; MIVF:  - I/O 1051 mL  - Not on IVF  GI Imaging: GI Surgeries / Procedures:   Central access: 07/27/23 TPN start date: 07/28/23   Nutritional Goals: Goal TPN rate is 65 mL/hr (provides 78 g of protein and 1787 kcals per day)  RD Assessment: on 4/3 Estimated Needs Total Energy Estimated Needs: 1500-1800 Total Protein Estimated Needs: 75-90g Total Fluid Estimated Needs: 1.8L/day  Current Nutrition:  - Bariatric diet   Plan:  Increase TPN to 65 mL/hr at 1800 Electrolytes in TPN:  Na 38mEq/L K 42mEq/L Ca 4mEq/L Mg  to 5 mEq/L Phos 20 mmol/L.  Cl:Ac 1:1 Add standard MVI and trace elements to TPN Continue  Sensitive q6h SSI and adjust as needed  Not on any IVF  Thiamine 100 mg IV daily x 5 through 4/6  BMP, magnesium, phosphorus with AM labs  Monitor TPN labs on Mon/Thurs    Adalberto Cole, PharmD, BCPS 07/30/2023 10:26 AM

## 2023-07-31 LAB — CBC
HCT: 24.8 % — ABNORMAL LOW (ref 36.0–46.0)
Hemoglobin: 7.7 g/dL — ABNORMAL LOW (ref 12.0–15.0)
MCH: 29.8 pg (ref 26.0–34.0)
MCHC: 31 g/dL (ref 30.0–36.0)
MCV: 96.1 fL (ref 80.0–100.0)
Platelets: 132 10*3/uL — ABNORMAL LOW (ref 150–400)
RBC: 2.58 MIL/uL — ABNORMAL LOW (ref 3.87–5.11)
RDW: 16.9 % — ABNORMAL HIGH (ref 11.5–15.5)
WBC: 4.2 10*3/uL (ref 4.0–10.5)
nRBC: 0 % (ref 0.0–0.2)

## 2023-07-31 LAB — BASIC METABOLIC PANEL WITH GFR
Anion gap: 5 (ref 5–15)
BUN: 25 mg/dL — ABNORMAL HIGH (ref 8–23)
CO2: 21 mmol/L — ABNORMAL LOW (ref 22–32)
Calcium: 8.4 mg/dL — ABNORMAL LOW (ref 8.9–10.3)
Chloride: 111 mmol/L (ref 98–111)
Creatinine, Ser: 0.7 mg/dL (ref 0.44–1.00)
GFR, Estimated: >60 mL/min (ref >60)
Glucose, Bld: 91 mg/dL (ref 70–99)
Potassium: 4.1 mmol/L (ref 3.5–5.1)
Sodium: 137 mmol/L (ref 135–145)

## 2023-07-31 LAB — HEPATIC FUNCTION PANEL
ALT: 16 U/L (ref 0–44)
AST: 41 U/L (ref 15–41)
Albumin: 2 g/dL — ABNORMAL LOW (ref 3.5–5.0)
Alkaline Phosphatase: 64 U/L (ref 38–126)
Bilirubin, Direct: <0.1 mg/dL (ref 0.0–0.2)
Total Bilirubin: 0.2 mg/dL (ref 0.0–1.2)
Total Protein: 4.8 g/dL — ABNORMAL LOW (ref 6.5–8.1)

## 2023-07-31 LAB — GLUCOSE, CAPILLARY
Glucose-Capillary: 100 mg/dL — ABNORMAL HIGH (ref 70–99)
Glucose-Capillary: 110 mg/dL — ABNORMAL HIGH (ref 70–99)
Glucose-Capillary: 123 mg/dL — ABNORMAL HIGH (ref 70–99)
Glucose-Capillary: 93 mg/dL (ref 70–99)

## 2023-07-31 LAB — MAGNESIUM: Magnesium: 1.7 mg/dL (ref 1.7–2.4)

## 2023-07-31 LAB — PHOSPHORUS: Phosphorus: 4.3 mg/dL (ref 2.5–4.6)

## 2023-07-31 MED ORDER — SODIUM CHLORIDE 0.9 % IV SOLN: INTRAVENOUS | Status: AC | PRN

## 2023-07-31 MED ORDER — MAGNESIUM SULFATE 2 GM/50ML IV SOLN
2.0000 g | Freq: Once | INTRAVENOUS | Status: AC
  Administered 2023-07-31: 2 g via INTRAVENOUS
  Filled 2023-07-31: qty 50

## 2023-07-31 MED ORDER — POTASSIUM ACETATE 2 MEQ/ML IV SOLN
INTRAVENOUS | Status: AC
  Filled 2023-07-31: qty 780

## 2023-07-31 NOTE — Plan of Care (Signed)

## 2023-07-31 NOTE — Progress Notes (Addendum)
 PHARMACY - TOTAL PARENTERAL NUTRITION CONSULT NOTE   Indication:  Intolerance to enteral feeding   Patient Measurements: Height: 5\' 1"  (154.9 cm) Weight: 86.4 kg (190 lb 7.6 oz) IBW/kg (Calculated) : 47.8 TPN AdjBW (KG): 56.4 Body mass index is 35.99 kg/m.   Assessment:  Pharmacy is consulted to start TPN on 73 yo female with PMG including gastric bypass. Pt has had failure to thrive and is not tolerating solid foods. 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 Plan to give TPN in anticipation of surgery reversal.   Glucose / Insulin:  - no history of DM  - CBG at goal ( CBG <180)  - No insulin given  Electrolytes:  - Magnesium 1.7 - Others are WNL, including CorrCa at 10 Renal:  - Scr <1  - BUN 25 Hepatic:  - LFTs WNL  - TBili <1 Intake / Output; MIVF:  - I/O (-)1150  mL, No PO intake uncharted. Unclear if actually none or if none charted  - Not on IVF  GI Imaging: GI Surgeries / Procedures:   Central access: 07/27/23 TPN start date: 07/28/23   Nutritional Goals: Goal TPN rate is 65 mL/hr (provides 78 g of protein and 1787 kcals per day)  RD Assessment: on 4/3 Estimated Needs Total Energy Estimated Needs: 1500-1800 Total Protein Estimated Needs: 75-90g Total Fluid Estimated Needs: 1.8L/day  Current Nutrition:  - Bariatric diet   Plan:  Now - Magnesium sulfate 2 gr IV x1   Continue  TPN to 65 mL/hr at 1800 Electrolytes in TPN:  Na 27mEq/L K 26mEq/L Ca 11mEq/L Mg  inc to 7 mEq/L Phos 20 mmol/L.  Cl:Ac 1:1 Add standard MVI and trace elements to TPN Continue  Sensitive q6h SSI and adjust as needed. If continues to not need and is tolerating TPN, may DC soon  Not on any IVF  Thiamine 100 mg IV daily x 5 through 4/6  Monitor TPN labs on Mon/Thurs    Adalberto Cole, PharmD, BCPS 07/31/2023 10:20 AM

## 2023-07-31 NOTE — Progress Notes (Signed)
 Progress Note: General Surgery Service   Chief Complaint/Subjective:  No acute events, no change.   Objective: Vital signs in last 24 hours: Temp:  [97.7 F (36.5 C)-98.3 F (36.8 C)] 97.9 F (36.6 C) (04/06 0540) Pulse Rate:  [80-101] 80 (04/06 0540) Resp:  [16-20] 16 (04/06 0540) BP: (143-158)/(77-91) 143/81 (04/06 0540) SpO2:  [98 %-100 %] 100 % (04/06 0540) Weight:  [86.4 kg] 86.4 kg (04/06 0547) Last BM Date : 07/30/23  Intake/Output from previous day: 04/05 0701 - 04/06 0700 In: 2272 [P.O.:1440; I.V.:832] Out: 2500 [Urine:2500] Intake/Output this shift: Total I/O In: -  Out: 150 [Urine:150]  Gen: NAD A&O OOB in chair Resp: breathing comfortably GI: soft, non distended, non tender.   Lab Results: CBC  Recent Labs    07/30/23 0342 07/31/23 0304  WBC 4.4 4.2  HGB 7.6* 7.7*  HCT 25.7* 24.8*  PLT 145* 132*   BMET Recent Labs    07/30/23 0342 07/31/23 0304  NA 138 137  K 4.3 4.1  CL 113* 111  CO2 20* 21*  GLUCOSE 81 91  BUN 29* 25*  CREATININE 0.72 0.70  CALCIUM 8.1* 8.4*   PT/INR No results for input(s): "LABPROT", "INR" in the last 72 hours. ABG No results for input(s): "PHART", "HCO3" in the last 72 hours.  Invalid input(s): "PCO2", "PO2"  Anti-infectives: Anti-infectives (From admission, onward)    None       Medications: Scheduled Meds:  acetaminophen  650 mg Oral Q6H   apixaban  5 mg Oral BID   Chlorhexidine Gluconate Cloth  6 each Topical Daily   docusate sodium  100 mg Oral BID   furosemide  20 mg Oral Daily   insulin aspart  0-9 Units Subcutaneous Q6H   meclizine  12.5 mg Oral BID   nystatin   Topical TID   pantoprazole (PROTONIX) IV  40 mg Intravenous Q12H   Ensure Max Protein  11 oz Oral Daily   sucralfate  1 g Oral TID WC & HS   Continuous Infusions:  sodium chloride 10 mL/hr at 07/31/23 0833   TPN ADULT (ION) 65 mL/hr at 07/30/23 1743   PRN Meds:.sodium chloride, alum & mag hydroxide-simeth, lip balm, ondansetron  **OR** ondansetron (ZOFRAN) IV, promethazine, simethicone, sodium chloride flush  Assessment/Plan: Christine [name] has had a terrible year after a gastric bypass and is now inpatient for TPN while awaiting possible reversal.   Failure to thrive Severe protein calorie malnutrition.  - bariatric clears, TPN - Eliquis for h/o DVT - continue ambulation, pulm toilet - Follow up nutrition labs Anemia of chronic disease.   LOS: 4 days    Almond Lint, MD  [facility] Use [email or web address] to contact on call provider

## 2023-07-31 NOTE — Progress Notes (Signed)
 Mobility Specialist - Progress Note   07/31/23 1109  Mobility  Activity Ambulated with assistance in hallway  Level of Assistance Standby assist, set-up cues, supervision of patient - no hands on  Assistive Device Front wheel walker  Distance Ambulated (ft) 300 ft  Activity Response Tolerated well  Mobility Referral Yes  Mobility visit 1 Mobility  Mobility Specialist Start Time (ACUTE ONLY) 1056  Mobility Specialist Stop Time (ACUTE ONLY) 1108  Mobility Specialist Time Calculation (min) (ACUTE ONLY) 12 min   Pt received in recliner and agreeable to mobility. Pt took x2 standing rest breaks d/t fatigue. Upon returning to room, pt c/o abdomen pain (6/10). RN made aware. No other complaints during session. Pt to recliner after session with all needs met.    [facility]

## 2023-07-31 NOTE — Plan of Care (Signed)
  Problem: Health Behavior/Discharge Planning: Goal: Ability to manage health-related needs will improve Outcome: Progressing   Problem: Nutrition: Goal: Adequate nutrition will be maintained Outcome: Progressing   Problem: Pain Managment: Goal: General experience of comfort will improve and/or be controlled Outcome: Progressing

## 2023-08-01 LAB — PHOSPHORUS: Phosphorus: 3.8 mg/dL (ref 2.5–4.6)

## 2023-08-01 LAB — TRIGLYCERIDES
Triglycerides: 196 mg/dL — ABNORMAL HIGH (ref <150)
Triglycerides: 185 mg/dL — ABNORMAL HIGH (ref <150)

## 2023-08-01 LAB — COMPREHENSIVE METABOLIC PANEL WITH GFR
ALT: 17 U/L (ref 0–44)
AST: 39 U/L (ref 15–41)
Albumin: 2.3 g/dL — ABNORMAL LOW (ref 3.5–5.0)
Alkaline Phosphatase: 67 U/L (ref 38–126)
Anion gap: 7 (ref 5–15)
BUN: 25 mg/dL — ABNORMAL HIGH (ref 8–23)
CO2: 19 mmol/L — ABNORMAL LOW (ref 22–32)
Calcium: 8.4 mg/dL — ABNORMAL LOW (ref 8.9–10.3)
Chloride: 108 mmol/L (ref 98–111)
Creatinine, Ser: 0.71 mg/dL (ref 0.44–1.00)
GFR, Estimated: >60 mL/min (ref >60)
Glucose, Bld: 100 mg/dL — ABNORMAL HIGH (ref 70–99)
Potassium: 4.3 mmol/L (ref 3.5–5.1)
Sodium: 134 mmol/L — ABNORMAL LOW (ref 135–145)
Total Bilirubin: 0.5 mg/dL (ref 0.0–1.2)
Total Protein: 5.1 g/dL — ABNORMAL LOW (ref 6.5–8.1)

## 2023-08-01 LAB — CBC
HCT: 25.2 % — ABNORMAL LOW (ref 36.0–46.0)
Hemoglobin: 7.8 g/dL — ABNORMAL LOW (ref 12.0–15.0)
MCH: 29.7 pg (ref 26.0–34.0)
MCHC: 31 g/dL (ref 30.0–36.0)
MCV: 95.8 fL (ref 80.0–100.0)
Platelets: 142 10*3/uL — ABNORMAL LOW (ref 150–400)
RBC: 2.63 MIL/uL — ABNORMAL LOW (ref 3.87–5.11)
RDW: 16.8 % — ABNORMAL HIGH (ref 11.5–15.5)
WBC: 4.6 10*3/uL (ref 4.0–10.5)
nRBC: 0 % (ref 0.0–0.2)

## 2023-08-01 LAB — COPPER, SERUM: Copper: 67 ug/dL — ABNORMAL LOW (ref 80–158)

## 2023-08-01 LAB — GLUCOSE, CAPILLARY
Glucose-Capillary: 100 mg/dL — ABNORMAL HIGH (ref 70–99)
Glucose-Capillary: 71 mg/dL (ref 70–99)
Glucose-Capillary: 85 mg/dL (ref 70–99)
Glucose-Capillary: 89 mg/dL (ref 70–99)

## 2023-08-01 LAB — VITAMIN K1, SERUM: VITAMIN K1: 0.12 ng/mL (ref 0.10–2.20)

## 2023-08-01 LAB — ZINC: Zinc: 29 ug/dL — ABNORMAL LOW (ref 44–115)

## 2023-08-01 LAB — MAGNESIUM: Magnesium: 2.1 mg/dL (ref 1.7–2.4)

## 2023-08-01 MED ORDER — TRAVASOL 10 % IV SOLN
INTRAVENOUS | Status: AC
  Filled 2023-08-01: qty 780

## 2023-08-01 NOTE — Progress Notes (Signed)
 [facility] Liaison Note  08/01/2023  Christine [name] [facility] 12-27-1950 [id number]  Location: RN Hospital Liaison screened the patient remotely at [facility].  Insurance: [employer]vantage   Christine [name] is a 73 y.o. female who is a Primary Care Patient of Carmelia Roller, Jilda Roche, DO  [facility] [employer]. The patient was screened for readmission hospitalization with noted extreme risk score for unplanned readmission risk with 2 IP/1 ED in 6 months.  The patient was assessed for potential Care Management service needs for post hospital transition for care coordination. Review of patient's electronic medical record reveals patient was admitted with Failure to Thrive in adult. Plans for pt to resume HHPT/OT with [facility] upon her discharge disposition. No anticipated needs for VBCI as this provider listed for [facility] via VBCI post hospital discharge.  Plan: [facility] Liaison will continue to follow progress and disposition to asess for post hospital community care coordination/management needs.  Referral request for community care coordination: anticipate Transitions of Care Team follow up.   VBCI Care Management/Population Health does not replace or interfere with any arrangements made by the Inpatient Transition of Care team.   For questions contact:   Christine Cousin, RN, BSN Hospital Liaison [place]   [facility], Population Health Office Hours MTWF  8:00 am-6:00 pm Direct Dial: [phone] mobile [name]

## 2023-08-01 NOTE — Progress Notes (Signed)
 Pt noncompliant yesterday and today w safety precautions. Refused to wear nonslip socks, insists on wearing shoes in her room that are 2 sizes to big. Pt also refuses to call out for help when needing to be OOB (ie go to BR), have repeatedly instructed pt to call for assistance but pt refuses. This afternoon, Dr Dossie Der advanced diet to Bariatric solids (low carb, high protein). Instructed pt on this change and she verbalized understanding. However pt later has demanded a banana to eat, calls to kitchen to clarify that a banana is high carbs and is not allowed. Pt very angry, screaming and cursing. Call to Dr Carolynne Edouard on call for [facility] and requested that pt have banana, this request was denied, spoke w pt about diet changes and that on call MD states no. Pt became irate w this nurse, screaming and cursing "bitch are you calling me a G-D liar??" CN made aware of the situation and spoke w pt, who cont to scream and curse at staff.

## 2023-08-01 NOTE — Progress Notes (Addendum)
 PHARMACY - TOTAL PARENTERAL NUTRITION CONSULT NOTE   Indication:  Intolerance to enteral feeding   Patient Measurements: Height: 5\' 1"  (154.9 cm) Weight: 84.2 kg (185 lb 10 oz) IBW/kg (Calculated) : 47.8 TPN AdjBW (KG): 56.4 Body mass index is 35.07 kg/m.  Assessment:  72yoF with hx gastric bypass in May 2024, weight at that time 131 kg. Pt has had rapid weight loss, inability to tolerate solid foods, complaints of severe weakness, and problems with gastrostomy tube displacement (Oct - mid-Nov) and ulcer repairs in Nov/Dec 2024. Now concern for stricture at marginal ulcer preventing intake of a regular post-operative diet, surgery team recommending reversal of bypass. 4/2 CT Abdomen c/w findings of prior gastric bypass without evidence of bowel obstruction  In total, ~50 kg loss since bypass. Pt took up to 100% of 2 meals on 4/4, no additional meal intake charted. Consult for 2 weeks of TPN prior to possible bypass reversal, surgery date pending.   Glucose / Insulin: A1c 6.3 in May 2024, CBG <130 with 1u SSI given in past 24h Electrolytes: Na down 134 (consistent downtrend since 4/2), Mg 2.1 (s/p 2g outside of TPN), CO2 19 (overall downtrend), K 4.3 (up from 4.1) others WNL Renal: Scr 0.71, BUN 25; lasix 20 mg PO daily Hepatic: LFTs WNL, Tbili 0.5 Intake / Output; MIVF: UOP 0.6 mL/kg/h, x3 BM 4/6 Net + 5L GI Imaging: none since TPN initiation GI Surgeries / Procedures: none since TPN initiation  Central access: 07/27/23 TPN start date: 07/28/23   Nutritional Goals: Goal TPN rate is 65 mL/hr (provides 78 g of protein and 1,575.6 kcals per day)  RD Assessment: on 4/3 Estimated Needs Total Energy Estimated Needs: 1500-1800 Total Protein Estimated Needs: 75-90g Total Fluid Estimated Needs: 1.8L/day  Current Nutrition:  TPN + full liquid bariatric diet Protein supplement (Ensure) charted given 4/5 & 4/6  Plan:  Continue TPN at 65 mL/hr at 1800 Electrolytes in TPN: increase Na 100  mEq/L, decrease K to 40 mEq/L (total 78 > 62 mEq), Ca 35mEq/L, Mg 7 mEq/L, Phos 20 mmol/L. Adj Cl:Ac 1:2 Add standard MVI and trace elements to TPN Completed Thiamine 100 mg IV daily x5 days on 4/6  Discontinue Sensitive q6h SSI, continue to monitor daily CBG Monitor TPN labs on Mon/Thurs & PRN  Rutherford Nail, PharmD PGY2 Critical Care Pharmacy Resident 08/01/2023 8:09 AM

## 2023-08-01 NOTE — Progress Notes (Signed)
   08/01/23 1043  PT Visit Information  Last PT Received On 08/01/23  Reason Eval/Treat Not Completed Patient declined, no reason specified (Pt politely deferring mobility, prefers afternoons. PT will follow up as schedule allows.)

## 2023-08-01 NOTE — Plan of Care (Signed)

## 2023-08-01 NOTE — Progress Notes (Signed)
 Progress Note: General Surgery Service   Chief Complaint/Subjective:  No acute events, no change. Wants a banana  Objective: Vital signs in last 24 hours: Temp:  [98.3 F (36.8 C)-98.8 F (37.1 C)] 98.5 F (36.9 C) (04/07 1405) Pulse Rate:  [92-108] 108 (04/07 1405) Resp:  [15-18] 18 (04/07 1405) BP: (102-130)/(69-72) 102/69 (04/07 1405) SpO2:  [100 %] 100 % (04/07 1405) Weight:  [84.2 kg] 84.2 kg (04/07 0500) Last BM Date : 07/31/23  Intake/Output from previous day: 04/06 0701 - 04/07 0700 In: 3679.5 [P.O.:1280; I.V.:2349.5; IV Piggyback:50] Out: 1150 [Urine:1150] Intake/Output this shift: Total I/O In: -  Out: 600 [Urine:600]  Gen: NAD A&O OOB in chair Resp: breathing comfortably GI: soft, non distended, non tender.   Lab Results: CBC  Recent Labs    07/31/23 0304 08/01/23 0252  WBC 4.2 4.6  HGB 7.7* 7.8*  HCT 24.8* 25.2*  PLT 132* 142*   BMET Recent Labs    07/31/23 0304 08/01/23 0252  NA 137 134*  K 4.1 4.3  CL 111 108  CO2 21* 19*  GLUCOSE 91 100*  BUN 25* 25*  CREATININE 0.70 0.71  CALCIUM 8.4* 8.4*   PT/INR No results for input(s): "LABPROT", "INR" in the last 72 hours. ABG No results for input(s): "PHART", "HCO3" in the last 72 hours.  Invalid input(s): "PCO2", "PO2"  Anti-infectives: Anti-infectives (From admission, onward)    None       Medications: Scheduled Meds:  acetaminophen  650 mg Oral Q6H   apixaban  5 mg Oral BID   Chlorhexidine Gluconate Cloth  6 each Topical Daily   docusate sodium  100 mg Oral BID   furosemide  20 mg Oral Daily   meclizine  12.5 mg Oral BID   nystatin   Topical TID   pantoprazole (PROTONIX) IV  40 mg Intravenous Q12H   Ensure Max Protein  11 oz Oral Daily   sucralfate  1 g Oral TID WC & HS   Continuous Infusions:  TPN ADULT (ION) 65 mL/hr at 07/31/23 1720   TPN ADULT (ION)     PRN Meds:.alum & mag hydroxide-simeth, lip balm, ondansetron **OR** ondansetron (ZOFRAN) IV, promethazine,  simethicone, sodium chloride flush  Assessment/Plan: Christine [name] has had a terrible year after a gastric bypass and is now inpatient for TPN while awaiting possible reversal.   Failure to thrive Severe protein calorie malnutrition.  - bariatric reg, TPN - Eliquis for h/o DVT - continue ambulation, pulm toilet - Follow up nutrition labs Anemia of chronic disease. - Plan for reversal of gastric bypass during this admission after interval of TPN   LOS: 5 days    Quentin Ore, MD  [facility] Use [email or web address] to contact on call provider

## 2023-08-02 LAB — GLUCOSE, CAPILLARY
Glucose-Capillary: 91 mg/dL (ref 70–99)
Glucose-Capillary: 96 mg/dL (ref 70–99)
Glucose-Capillary: 93 mg/dL (ref 70–99)

## 2023-08-02 LAB — VITAMIN E
Vitamin E (Alpha Tocopherol): 12.7 mg/L (ref 9.0–29.0)
Vitamin E(Gamma Tocopherol): 0.7 mg/L (ref 0.5–4.9)

## 2023-08-02 LAB — VITAMIN A: Vitamin A (Retinoic Acid): 29.1 ug/dL (ref 22.0–69.5)

## 2023-08-02 LAB — VITAMIN B1: Vitamin B1 (Thiamine): 33 nmol/L — ABNORMAL LOW (ref 66.5–200.0)

## 2023-08-02 MED ORDER — TRAVASOL 10 % IV SOLN
INTRAVENOUS | Status: AC
  Filled 2023-08-02: qty 780

## 2023-08-02 NOTE — Progress Notes (Signed)
 Progress Note: General Surgery Service   Chief Complaint/Subjective:  No acute events, no change.  Objective: Vital signs in last 24 hours: Temp:  [98.4 F (36.9 C)-98.6 F (37 C)] 98.4 F (36.9 C) (04/08 0621) Pulse Rate:  [84-108] 84 (04/08 0621) Resp:  [18] 18 (04/08 0621) BP: (102-146)/(65-71) 146/65 (04/08 0621) SpO2:  [100 %] 100 % (04/08 0621) Last BM Date : 07/31/23  Intake/Output from previous day: 04/07 0701 - 04/08 0700 In: 2621.6 [P.O.:1320; I.V.:1301.6] Out: 1400 [Urine:1400] Intake/Output this shift: No intake/output data recorded.  Gen: NAD A&O OOB in chair Resp: breathing comfortably GI: soft, non distended, non tender.   Lab Results: CBC  Recent Labs    07/31/23 0304 08/01/23 0252  WBC 4.2 4.6  HGB 7.7* 7.8*  HCT 24.8* 25.2*  PLT 132* 142*   BMET Recent Labs    07/31/23 0304 08/01/23 0252  NA 137 134*  K 4.1 4.3  CL 111 108  CO2 21* 19*  GLUCOSE 91 100*  BUN 25* 25*  CREATININE 0.70 0.71  CALCIUM 8.4* 8.4*   PT/INR No results for input(s): "LABPROT", "INR" in the last 72 hours. ABG No results for input(s): "PHART", "HCO3" in the last 72 hours.  Invalid input(s): "PCO2", "PO2"  Anti-infectives: Anti-infectives (From admission, onward)    None       Medications: Scheduled Meds:  acetaminophen  650 mg Oral Q6H   apixaban  5 mg Oral BID   Chlorhexidine Gluconate Cloth  6 each Topical Daily   docusate sodium  100 mg Oral BID   furosemide  20 mg Oral Daily   meclizine  12.5 mg Oral BID   nystatin   Topical TID   pantoprazole (PROTONIX) IV  40 mg Intravenous Q12H   Ensure Max Protein  11 oz Oral Daily   sucralfate  1 g Oral TID WC & HS   Continuous Infusions:  TPN ADULT (ION) 65 mL/hr at 08/01/23 1735   PRN Meds:.alum & mag hydroxide-simeth, lip balm, ondansetron **OR** ondansetron (ZOFRAN) IV, promethazine, simethicone, sodium chloride flush  Assessment/Plan: Christine [name] has had a terrible year after a gastric bypass  and is now inpatient for TPN while awaiting possible reversal.   Failure to thrive Severe protein calorie malnutrition.  - bariatric reg, TPN - Eliquis for h/o DVT - continue ambulation, pulm toilet - Follow up nutrition labs Anemia of chronic disease. - Plan for reversal of gastric bypass during this admission after interval of TPN   LOS: 6 days    Christine Ore, MD  [facility] Use [email or web address] to contact on call provider

## 2023-08-02 NOTE — Plan of Care (Signed)
  Problem: Pain Managment: Goal: General experience of comfort will improve and/or be controlled Outcome: Progressing   Problem: Safety: Goal: Ability to remain free from injury will improve Outcome: Progressing   Problem: Metabolic: Goal: Ability to maintain appropriate glucose levels will improve Outcome: Progressing

## 2023-08-02 NOTE — Progress Notes (Signed)
 PHARMACY - TOTAL PARENTERAL NUTRITION CONSULT NOTE   Indication:  Intolerance to enteral feeding   Patient Measurements: Height: 5\' 1"  (154.9 cm) Weight: 84.2 kg (185 lb 10 oz) IBW/kg (Calculated) : 47.8 TPN AdjBW (KG): 56.4 Body mass index is 35.07 kg/m.  Assessment:  72yoF with hx gastric bypass in May 2024, weight at that time 131 kg. Pt has had rapid weight loss, inability to tolerate solid foods, complaints of severe weakness, and problems with gastrostomy tube displacement (Oct - mid-Nov) and ulcer repairs in Nov/Dec 2024. Now concern for stricture at marginal ulcer preventing intake of a regular post-operative diet, surgery team recommending reversal of bypass. 4/2 CT Abdomen c/w findings of prior gastric bypass without evidence of bowel obstruction  In total, ~50 kg loss since bypass. Pt took up to 100% of 2 meals on 4/4, no additional meal intake charted. Consult for 2 weeks of TPN prior to possible bypass reversal, surgery date pending.   Glucose / Insulin: A1c 6.3 in May 2024, CBGs <100 Electrolytes: 4/7 labs: Na down 134 (consistent downtrend since 4/2), Mg 2.1 (s/p 2g outside of TPN), CO2 19 (overall downtrend), K 4.3 (up from 4.1) others WNL Renal: 4/7: Scr 0.71, BUN 25; lasix 20 mg PO daily Hepatic: LFTs WNL, Tbili 0.5 Intake / Output; MIVF: UOP 0.7 mL/kg/h, x2 BM 4/7 Net + 6.3L GI Imaging: none since TPN initiation GI Surgeries / Procedures: none since TPN initiation  Central access: 07/27/23 TPN start date: 07/28/23   Nutritional Goals: Goal TPN rate is 65 mL/hr (provides 78 g of protein and 1,575.6 kcals per day)  RD Assessment: on 4/3 Estimated Needs Total Energy Estimated Needs: 1500-1800 Total Protein Estimated Needs: 75-90g Total Fluid Estimated Needs: 1.8L/day  Current Nutrition:  TPN + full liquid bariatric diet, protein supplement (Ensure)  Plan:  Continue TPN at 65 mL/hr at 1800 Electrolytes in TPN: Na 100 mEq/L, K 40 mEq/L, Ca 82mEq/L, Mg 7 mEq/L, Phos  20 mmol/L. Adj Cl:Ac 1:2 Add standard MVI and trace elements to TPN Completed Thiamine 100 mg IV daily x5 days on 4/6  Monitor TPN labs on Mon/Thurs & PRN; next labs 4/10 AM  Rutherford Nail, PharmD PGY2 Critical Care Pharmacy Resident 08/02/2023 8:52 AM

## 2023-08-02 NOTE — Progress Notes (Signed)
 Occupational Therapy Treatment Patient Details Name: Christine [name] MRN: [id number] DOB: 1950-12-16 Today's Date: 08/02/2023   History of present illness 73 y.o. female admitted with FTT, abdominal pain, inablilty to tolerate solids. Plan is for 2 weeks of TPN in prepartion for gastric bypass reversal. 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   OT comments  Patient seen for skilled OT session this afternoon. Patient bed level but then open to OOB toileting and EOB light UE therex and activity tolerance progression. Encouraged spreading light activity throughout day and OOB with OT emphasis on pacing and compensatory techniques for energy conservation with simple tasks with cues and reinforcement. Patient continues to benefit from Acute care hospital level OT services to maximize function. Will most likely require HHOT once ready for discharge to home.       If plan is discharge home, recommend the following:  A little help with bathing/dressing/bathroom;Assistance with cooking/housework;Assist for transportation   Equipment Recommendations  None recommended by OT       Precautions / Restrictions Precautions Precautions: Fall Recall of Precautions/Restrictions: Intact Restrictions Weight Bearing Restrictions Per Provider Order: No       Mobility Bed Mobility Overal bed mobility: Modified Independent, Needs Assistance Bed Mobility: Rolling, Supine to Sit, Sit to Supine Rolling: Modified independent (Device/Increase time)   Supine to sit: Modified independent (Device/Increase time) Sit to supine: Supervision, HOB elevated, Used rails        Transfers Overall transfer level: Needs assistance                 General transfer comment: see above for commode transfer     Balance Overall balance assessment: Needs assistance         Standing balance support: Bilateral upper extremity supported, During functional activity,  Reliant on assistive device for balance Standing balance-Leahy Scale: Fair                             ADL either performed or assessed with clinical judgement   ADL Overall ADL's : Needs assistance/impaired                         Toilet Transfer: Supervision/safety;Ambulation   Toileting- Clothing Manipulation and Hygiene: Supervision/safety         General ADL Comments: educated on simple ECT strategies with BADL's and mobility    Extremity/Trunk Assessment Upper Extremity Assessment Upper Extremity Assessment: Right hand dominant;Generalized weakness   Lower Extremity Assessment Lower Extremity Assessment: Generalized weakness                 Communication Communication Communication: No apparent difficulties   Cognition Arousal: Alert Behavior During Therapy: WFL for tasks assessed/performed                                 Following commands: Intact        Cueing      Exercises Exercises: General Upper Extremity (Patient completed 5 sets of 1 minute continuous light UE target activity EOB with brief rests and min cues for pacing)    Shoulder Instructions       General Comments Pt with continent void, requests to use BSC vs toilet in bathroom, noting difficulty transferring off toilet even with use of grab bars, declines PT placing BSC over toilet in bathroom.    Pertinent  Vitals/ Pain       Pain Assessment Pain Assessment: No/denies pain   Frequency  Min 2X/week        Progress Toward Goals  OT Goals(current goals can now be found in the care plan section)  Progress towards OT goals: Progressing toward goals  Acute Rehab OT Goals Patient Stated Goal: contiue to improve strength OT Goal Formulation: With patient Time For Goal Achievement: 08/11/23 Potential to Achieve Goals: Good ADL Goals Pt Will Perform Lower Body Bathing: with modified independence;with adaptive equipment;sit to/from stand Pt Will  Perform Lower Body Dressing: with modified independence;sit to/from stand;with adaptive equipment Pt Will Transfer to Toilet: with modified independence;ambulating Additional ADL Goal #1: Pt will independently identify 3 strategies to reduce risk of falls Additional ADL Goal #2: Pt will independently identify 3 energy conservation techniques to increase independence and endurance for ADL/IADL tasks   AM-PAC OT "6 Clicks" Daily Activity     Outcome Measure   Help from another person eating meals?: None Help from another person taking care of personal grooming?: A Little Help from another person toileting, which includes using toliet, bedpan, or urinal?: A Little Help from another person bathing (including washing, rinsing, drying)?: A Little Help from another person to put on and taking off regular upper body clothing?: A Little Help from another person to put on and taking off regular lower body clothing?: A Little 6 Click Score: 19    End of Session Equipment Utilized During Treatment: Rolling walker (2 wheels);Gait belt  OT Visit Diagnosis: Unsteadiness on feet (R26.81);Repeated falls (R29.6);Muscle weakness (generalized) (M62.81)   Activity Tolerance Patient limited by fatigue   Patient Left in bed;with call bell/phone within reach;with bed alarm set   Nurse Communication Mobility status (voiding added to Flowsheets)        Time: 6578-4696 OT Time Calculation (min): 30 min  Charges: OT General Charges $OT Visit: 1 Visit OT Treatments $Therapeutic Activity: 23-37 mins  Keonte Daubenspeck OT/L Acute Rehabilitation Department  [phone]  08/02/2023, 2:48 PM

## 2023-08-02 NOTE — Progress Notes (Signed)
 Physical Therapy Treatment Patient Details Name: Christine [name] MRN: [id number] DOB: Sep 11, 1950 Today's Date: 08/02/2023   History of Present Illness 73 y.o. female admitted with FTT, abdominal pain, inablilty to tolerate solids. Plan is for 2 weeks of TPN in prepartion for gastric bypass reversal. 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    PT Comments  Pt seen for PT tx with pt agreeable. Pt is able to complete bed mobility with mod I, STS with mod I, step pivot bed>BSC without AD with supervision. Pt ambulates to opposite end of hallway & back with rollator & supervision, requiring one seated rest break on rollator after standing x 6 minutes to fill out daisy award form (BUE leaning on window sill for support). Pt would benefit from ongoing PT treatment to address endurance, balance, to reduce fall risk with mobility. Pt noting fatigue by end of session; jokingly asking for chips throughout session.    If plan is discharge home, recommend the following: A little help with bathing/dressing/bathroom;Assist for transportation;Assistance with cooking/housework;Help with stairs or ramp for entrance   Can travel by private vehicle        Equipment Recommendations  None recommended by PT    Recommendations for Other Services       Precautions / Restrictions Precautions Precautions: Fall Precaution/Restrictions Comments: pt reports multiple falls in past 6 months Restrictions Weight Bearing Restrictions Per Provider Order: No     Mobility  Bed Mobility Overal bed mobility: Modified Independent Bed Mobility: Supine to Sit     Supine to sit: Modified independent (Device/Increase time), HOB elevated, Used rails          Transfers Overall transfer level: Modified independent Equipment used: None Transfers: Sit to/from Stand, Bed to chair/wheelchair/BSC Sit to Stand: Modified independent (Device/Increase time)   Step pivot transfers:  Supervision (bed>BSC on L; pt reporting she's transferring on her own without nursing assistance "much to their chagrin")            Ambulation/Gait Ambulation/Gait assistance: Supervision Gait Distance (Feet): 150 Feet (+ 150 ft) Assistive device: Rollator (4 wheels) Gait Pattern/deviations: Step-through pattern, Decreased stride length, Decreased step length - left, Decreased step length - right Gait velocity: decreased gait speed     General Gait Details: Pt begins with upright posture but demonstrates more forward flexion as she fatigues. Requires one sitting rest break after prolonged standing; cuing re: parking rollator against wall.   Stairs             Wheelchair Mobility     Tilt Bed    Modified Rankin (Stroke Patients Only)       Balance Overall balance assessment: Needs assistance Sitting-balance support: Feet supported, No upper extremity supported Sitting balance-Leahy Scale: Good     Standing balance support: During functional activity, No upper extremity supported Standing balance-Leahy Scale: Good Standing balance comment: performs peri hygiene in standing without LOB; ambulates a few steps BSC>sink without AD with supervision, no LOB                            Communication Communication Communication: No apparent difficulties  Cognition Arousal: Alert Behavior During Therapy: WFL for tasks assessed/performed                           PT - Cognition Comments: pt reports slight memory problems over the past few days, requires assistance to recall "  gmail" portion of her email address, attempts to hold on to walker backwards, requiring cuing to turn it around with pt reporting she didn't realize she was backwards Following commands: Intact      Cueing    Exercises      General Comments General comments (skin integrity, edema, etc.): Pt with continent void, requests to use BSC vs toilet in bathroom, noting difficulty  transferring off toilet even with use of grab bars, declines PT placing BSC over toilet in bathroom.      Pertinent Vitals/Pain Pain Assessment Pain Assessment: Faces Faces Pain Scale: No hurt    Home Living                          Prior Function            PT Goals (current goals can now be found in the care plan section) Acute Rehab PT Goals Patient Stated Goal: likes to sleep and watch tv PT Goal Formulation: With patient Time For Goal Achievement: 08/11/23 Potential to Achieve Goals: Good Progress towards PT goals: Progressing toward goals    Frequency    Min 2X/week      PT Plan      Co-evaluation              AM-PAC PT "6 Clicks" Mobility   Outcome Measure  Help needed turning from your back to your side while in a flat bed without using bedrails?: None Help needed moving from lying on your back to sitting on the side of a flat bed without using bedrails?: None Help needed moving to and from a bed to a chair (including a wheelchair)?: None Help needed standing up from a chair using your arms (e.g., wheelchair or bedside chair)?: None Help needed to walk in hospital room?: A Little Help needed climbing 3-5 steps with a railing? : A Little 6 Click Score: 22    End of Session   Activity Tolerance: Patient tolerated treatment well;Patient limited by fatigue Patient left: in bed;with call bell/phone within reach   PT Visit Diagnosis: Difficulty in walking, not elsewhere classified (R26.2);History of falling (Z91.81)     Time: 5956-3875 PT Time Calculation (min) (ACUTE ONLY): 26 min  Charges:    $Therapeutic Activity: 23-37 mins PT General Charges $$ ACUTE PT VISIT: 1 Visit                     Aleda Grana, PT, DPT 08/02/23, 2:16 PM   Sandi Mariscal 08/02/2023, 2:13 PM

## 2023-08-03 LAB — GLUCOSE, CAPILLARY
Glucose-Capillary: 104 mg/dL — ABNORMAL HIGH (ref 70–99)
Glucose-Capillary: 95 mg/dL (ref 70–99)
Glucose-Capillary: 100 mg/dL — ABNORMAL HIGH (ref 70–99)

## 2023-08-03 MED ORDER — COPPER 2 MG PO TABS
2.0000 mg | Freq: Every day | Status: DC
  Administered 2023-08-03 – 2023-08-11 (×9): 2 mg via ORAL
  Filled 2023-08-03 (×15): qty 1

## 2023-08-03 MED ORDER — TRAVASOL 10 % IV SOLN
INTRAVENOUS | Status: AC
  Filled 2023-08-03: qty 780

## 2023-08-03 MED ORDER — THIAMINE MONONITRATE 100 MG PO TABS
500.0000 mg | ORAL_TABLET | Freq: Every day | ORAL | Status: DC
  Administered 2023-08-03: 500 mg via ORAL
  Administered 2023-08-04: 200 mg via ORAL
  Administered 2023-08-05 – 2023-08-06 (×2): 500 mg via ORAL
  Administered 2023-08-07 – 2023-08-17 (×5): 200 mg via ORAL
  Administered 2023-08-19: 500 mg via ORAL
  Filled 2023-08-03 (×16): qty 5

## 2023-08-03 MED ORDER — ZINC SULFATE 220 (50 ZN) MG PO CAPS
220.0000 mg | ORAL_CAPSULE | Freq: Every day | ORAL | Status: DC
  Administered 2023-08-03 – 2023-08-23 (×15): 220 mg via ORAL
  Filled 2023-08-03 (×21): qty 1

## 2023-08-03 MED ORDER — COPPER 2 MG PO TABS
2.0000 mg | Freq: Every day | Status: DC
Start: 2023-08-03 — End: 2023-08-03
  Filled 2023-08-03: qty 1

## 2023-08-03 MED ORDER — ADULT MULTIVITAMIN W/MINERALS CH
1.0000 | ORAL_TABLET | Freq: Every day | ORAL | Status: DC
  Administered 2023-08-04 – 2023-08-23 (×18): 1 via ORAL
  Filled 2023-08-03 (×19): qty 1

## 2023-08-03 NOTE — Progress Notes (Signed)
 Mobility Specialist - Progress Note   08/03/23 1401  Mobility  Activity Ambulated with assistance in hallway  Level of Assistance Modified independent, requires aide device or extra time  Assistive Device Four wheel walker  Distance Ambulated (ft) 500 ft  Activity Response Tolerated well  Mobility Referral Yes  Mobility visit 1 Mobility  Mobility Specialist Start Time (ACUTE ONLY) 1341  Mobility Specialist Stop Time (ACUTE ONLY) 1401  Mobility Specialist Time Calculation (min) (ACUTE ONLY) 20 min   Pt received in bed and agreeable to mobility. No complaints during session. Pt to bed after session with all needs met.    [facility]

## 2023-08-03 NOTE — Progress Notes (Addendum)
 Primary RN sitting at nurse's station prior to shift change, family member from patient's room in 1317 alerted primary RN that patient had fallen in room 1315, primary RN and charge RN entered patient room, patient face down in front of bedside commode, patient alert and oriented x4, patient stated she was okay but needed help up, staff then helped patient to her feet, primary RN reviewed patient's skin, no harm to patient's skin visual to RN, Consulting civil engineer verified skin check, charge RN and primary RN assisted patient back to bed, bed alarm turned on and side rails placed up, primary RN educated patient on the importance of calling for help prior to using the bedside commode, patient verbalized understanding, primary RN asked patient for a second time if she was hurt, patient stated that she felt fine at this time, charge RN took patient's vital signs, vital signs stable at this time, RN notified MD and made MD aware of situation.

## 2023-08-03 NOTE — Progress Notes (Signed)
 Progress Note: General Surgery Service   Chief Complaint/Subjective:  No acute events, no change.  Objective: Vital signs in last 24 hours: Temp:  [97.5 F (36.4 C)-98.3 F (36.8 C)] 98.3 F (36.8 C) (04/09 0626) Pulse Rate:  [82-90] 82 (04/09 0626) Resp:  [18] 18 (04/09 0626) BP: (105-139)/(74-90) 120/76 (04/09 0626) SpO2:  [100 %] 100 % (04/09 0626) Last BM Date : 07/31/23  Intake/Output from previous day: 04/08 0701 - 04/09 0700 In: 2844.5 [P.O.:1320; I.V.:1524.5] Out: 1300 [Urine:1300] Intake/Output this shift: No intake/output data recorded.  Gen: NAD A&O OOB in chair Resp: breathing comfortably GI: soft, non distended, non tender.   Lab Results: CBC  Recent Labs    08/01/23 0252  WBC 4.6  HGB 7.8*  HCT 25.2*  PLT 142*   BMET Recent Labs    08/01/23 0252  NA 134*  K 4.3  CL 108  CO2 19*  GLUCOSE 100*  BUN 25*  CREATININE 0.71  CALCIUM 8.4*   PT/INR No results for input(s): "LABPROT", "INR" in the last 72 hours. ABG No results for input(s): "PHART", "HCO3" in the last 72 hours.  Invalid input(s): "PCO2", "PO2"  Anti-infectives: Anti-infectives (From admission, onward)    None       Medications: Scheduled Meds:  acetaminophen  650 mg Oral Q6H   apixaban  5 mg Oral BID   Chlorhexidine Gluconate Cloth  6 each Topical Daily   docusate sodium  100 mg Oral BID   furosemide  20 mg Oral Daily   meclizine  12.5 mg Oral BID   nystatin   Topical TID   pantoprazole (PROTONIX) IV  40 mg Intravenous Q12H   Ensure Max Protein  11 oz Oral Daily   sucralfate  1 g Oral TID WC & HS   Continuous Infusions:  TPN ADULT (ION) 65 mL/hr at 08/02/23 1702   PRN Meds:.alum & mag hydroxide-simeth, lip balm, ondansetron **OR** ondansetron (ZOFRAN) IV, promethazine, simethicone, sodium chloride flush  Assessment/Plan: Ms. Kubota has had a terrible year after a gastric bypass and is now inpatient for TPN while awaiting possible reversal.   Failure to  thrive Severe protein calorie malnutrition.  - bariatric reg, TPN - Eliquis for h/o DVT - continue ambulation, pulm toilet - Follow up nutrition labs Anemia of chronic disease. - Plan for reversal of gastric bypass 08/12/23 at 730 this admission after interval of TPN   LOS: 7 days    Quentin Ore, MD  [facility] Use [email or web address] to contact on call provider

## 2023-08-03 NOTE — Progress Notes (Signed)
 Nutrition Follow-up  DOCUMENTATION CODES:   Severe malnutrition in context of chronic illness  INTERVENTION:   -TPN management per Pharmacy -Daily weights while on TPN  -Multivitamin with minerals daily starting 4/10  -MD ordered vitamin labs.  -Patient found to have low Thiamine, Copper and zinc labs. MD has ordered supplementation.  -Ensure MAX Protein po daily, each supplement provides 150 kcal and 30 grams of protein   NUTRITION DIAGNOSIS:   Severe Malnutrition related to chronic illness, altered GI function as evidenced by energy intake < or equal to 75% for > or equal to 1 month, percent weight loss.  Ongoing.  GOAL:   Patient will meet greater than or equal to 90% of their needs  Meeting with TPN  MONITOR:   PO intake, Supplement acceptance, Labs (TPN)  ASSESSMENT:   73 y.o. female admitted with FTT, abdominal pain, inablilty to tolerate solids. Plan is for 2 weeks of TPN in prepartion for gastric bypass reversal. 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  4/2: admitted 4/3: TPN initiated 4/7: diet advanced to bariatric advanced, low carb, high protein diet  Patient currently consuming 50% of meals.  Per surgery note, plan is for gastric bypass reversal on 4/18. TPN to continues until surgery. Alerted surgery to vitamin lab results, ordered appropriate supplementation.   TPN continues at 65 ml/hr, providing 1575 kcals and 78g protein.   Admission weight: 181 lbs Last weight 4/7: 185 lbs Daily weights are ordered.  Vitamin lab results: -Vit D pending -Vit A WNL -Zinc (29) low -Copper (67) low -Vit E WNL -Vit K WNL -Thiamine (33) low  Medications: Colace, Lasix, Thiamine, Zinc sulfate, Carafate, Zofran  Labs reviewed: CBGs: 95-104  Diet Order:   Diet Order             Diet bariatric advanced Room service appropriate? Yes; Fluid consistency: Thin  Diet effective now                   EDUCATION NEEDS:    Education needs have been addressed  Skin:  Skin Assessment: Skin Integrity Issues: Skin Integrity Issues:: Stage II Stage II: sacrum  Last BM:  4/6  Height:   Ht Readings from Last 1 Encounters:  07/27/23 5\' 1"  (1.549 m)    Weight:   Wt Readings from Last 1 Encounters:  08/01/23 84.2 kg    BMI:  Body mass index is 35.07 kg/m.  Estimated Nutritional Needs:   Kcal:  1500-1800  Protein:  75-90g  Fluid:  1.8L/day  Tilda Franco, MS, RD, LDN Inpatient Clinical Dietitian Contact via Secure chat

## 2023-08-03 NOTE — Plan of Care (Signed)
   Problem: Education: Goal: Knowledge of General Education information will improve Description Including pain rating scale, medication(s)/side effects and non-pharmacologic comfort measures Outcome: Progressing   Problem: Health Behavior/Discharge Planning: Goal: Ability to manage health-related needs will improve Outcome: Progressing

## 2023-08-03 NOTE — TOC Progression Note (Signed)
 Transition of Care [facility]) - Progression Note    Patient Details  Name: Christine [name] MRN: [id number] Date of Birth: Jan 12, 1951  [facility] CM/SW Contact  Howell Rucks, RN Phone Number: 08/03/2023, 12:04 PM  Clinical Narrative:   Per MD progress notes- Plan for reversal of gastric bypass 08/12/23 at 730 this admission after interval of TPN . TOC will continue to follow.     Expected Discharge Plan: Home w Home Health Services Barriers to Discharge: Continued Medical Work up  Expected Discharge Plan and Services       Living arrangements for the past 2 months: Single Family Home                           HH Arranged: PT [facility] Agency: [facility] Date HH Agency Contacted: 07/28/23 Time HH Agency Contacted: 1257 Representative spoke with at [facility] Agency: Haywood Lasso   Social Determinants of Health (SDOH) Interventions SDOH Screenings   Food Insecurity: Food Insecurity Present (07/27/2023)  Housing: Low Risk  (07/27/2023)  Transportation Needs: Unmet Transportation Needs (07/27/2023)  Utilities: Not At Risk (07/27/2023)  Alcohol Screen: Low Risk  (06/24/2023)  Depression (PHQ2-9): Medium Risk (06/01/2023)  Financial Resource Strain: Low Risk  (06/24/2023)  Physical Activity: Insufficiently Active (06/24/2023)  Social Connections: Moderately Isolated (07/27/2023)  Stress: No Stress Concern Present (06/24/2023)  Recent Concern: Stress - Stress Concern Present (06/01/2023)  Tobacco Use: Medium Risk (07/27/2023)  Health Literacy: Medium Risk (12/01/2022)   Received from [facility]    Readmission Risk Interventions    07/28/2023   12:55 PM 04/22/2023    2:07 PM 03/23/2023   10:43 AM  Readmission Risk Prevention Plan  Transportation Screening Complete Complete Complete  Medication Review Oceanographer) Complete Complete Complete  PCP or Specialist appointment within 3-5 days of discharge Complete  Complete  HRI or Home Care Consult Complete Complete Complete   SW Recovery Care/Counseling Consult Complete Complete Complete  Palliative Care Screening Not Applicable Not Applicable Not Applicable  Skilled Nursing Facility Not Applicable Complete Complete

## 2023-08-03 NOTE — Progress Notes (Signed)
 PHARMACY - TOTAL PARENTERAL NUTRITION CONSULT NOTE   Indication:  Intolerance to enteral feeding   Patient Measurements: Height: 5\' 1"  (154.9 cm) Weight: 84.2 kg (185 lb 10 oz) IBW/kg (Calculated) : 47.8 TPN AdjBW (KG): 56.4 Body mass index is 35.07 kg/m.  Assessment:  72yoF with hx gastric bypass in May 2024, weight at that time 131 kg. Pt has had rapid weight loss, inability to tolerate solid foods, complaints of severe weakness, and problems with gastrostomy tube displacement (Oct - mid-Nov) and ulcer repairs in Nov/Dec 2024. Now concern for stricture at marginal ulcer preventing intake of a regular post-operative diet, surgery team recommending reversal of bypass. 4/2 CT Abdomen c/w findings of prior gastric bypass without evidence of bowel obstruction  In total, ~50 kg loss since bypass. Pt took up to 100% of 2 meals on 4/4, no additional meal intake charted. Consult for 2 weeks of TPN prior to bypass reversal, planned for 4/18.   Glucose / Insulin: A1c 6.3 in May 2024, CBGs <100 Electrolytes: 4/7 labs: Na down 134 (consistent downtrend since 4/2), Mg 2.1 (s/p 2g outside of TPN), CO2 19 (overall downtrend), K 4.3 (up from 4.1) others WNL Renal: 4/7: Scr 0.71, BUN 25; lasix 20 mg PO daily Hepatic: 4/7 LFTs WNL, Tbili 0.5 Intake / Output; MIVF: UOP 0.6 mL/kg/h, x1 BM 4/8 Net + 7.5L  GI Imaging: none since TPN initiation GI Surgeries / Procedures: none since TPN initiation  Central access: 07/27/23 TPN start date: 07/28/23   Nutritional Goals: Goal TPN rate is 65 mL/hr (provides 78 g of protein and 1,575.6 kcals per day)  RD Assessment: on 4/3 Estimated Needs Total Energy Estimated Needs: 1500-1800 Total Protein Estimated Needs: 75-90g Total Fluid Estimated Needs: 1.8L/day  Current Nutrition:  TPN + full liquid bariatric diet, protein supplement (Ensure)  Plan:  Continue TPN at 65 mL/hr at 1800 Electrolytes in TPN: Na 100 mEq/L (increased 4/7), K 40 mEq/L (decreased 4/7), Ca  5 mEq/L, Mg 7 mEq/L, Phos 20 mmol/L. Adj Cl:Ac 1:2 Remove standard MVI and trace elements from TPN Supplement multivitamin with minerals PO Zinc 220 mg, copper 2 mg, thiamine 500 mg per RD Monitor TPN labs on Mon/Thurs & PRN; next labs 4/10 AM  Rutherford Nail, PharmD PGY2 Critical Care Pharmacy Resident 08/03/2023 8:57 AM

## 2023-08-04 LAB — COMPREHENSIVE METABOLIC PANEL WITH GFR
ALT: 17 U/L (ref 0–44)
AST: 35 U/L (ref 15–41)
Albumin: 2.2 g/dL — ABNORMAL LOW (ref 3.5–5.0)
Alkaline Phosphatase: 66 U/L (ref 38–126)
Anion gap: 5 (ref 5–15)
BUN: 27 mg/dL — ABNORMAL HIGH (ref 8–23)
CO2: 25 mmol/L (ref 22–32)
Calcium: 8.5 mg/dL — ABNORMAL LOW (ref 8.9–10.3)
Chloride: 108 mmol/L (ref 98–111)
Creatinine, Ser: 0.74 mg/dL (ref 0.44–1.00)
GFR, Estimated: >60 mL/min (ref >60)
Glucose, Bld: 96 mg/dL (ref 70–99)
Potassium: 3.8 mmol/L (ref 3.5–5.1)
Sodium: 138 mmol/L (ref 135–145)
Total Bilirubin: <0.2 mg/dL (ref 0.0–1.2)
Total Protein: 5.1 g/dL — ABNORMAL LOW (ref 6.5–8.1)

## 2023-08-04 LAB — PHOSPHORUS: Phosphorus: 4.4 mg/dL (ref 2.5–4.6)

## 2023-08-04 LAB — MAGNESIUM: Magnesium: 2 mg/dL (ref 1.7–2.4)

## 2023-08-04 MED ORDER — STERILE WATER FOR INJECTION IV SOLN
INTRAMUSCULAR | Status: AC
  Filled 2023-08-04: qty 780

## 2023-08-04 MED ORDER — PANTOPRAZOLE SODIUM 40 MG PO TBEC
40.0000 mg | DELAYED_RELEASE_TABLET | Freq: Two times a day (BID) | ORAL | Status: DC
  Administered 2023-08-04 – 2023-08-23 (×38): 40 mg via ORAL
  Filled 2023-08-04 (×38): qty 1

## 2023-08-04 NOTE — Plan of Care (Signed)
 ?  Problem: Clinical Measurements: ?Goal: Will remain free from infection ?Outcome: Progressing ?  ?

## 2023-08-04 NOTE — Progress Notes (Signed)
 Progress Note: General Surgery Service   Chief Complaint/Subjective:  No acute events, no change.  Objective: Vital signs in last 24 hours: Temp:  [97.8 F (36.6 C)-98.4 F (36.9 C)] 98.1 F (36.7 C) (04/10 0616) Pulse Rate:  [77-97] 81 (04/10 0616) Resp:  [15-18] 15 (04/10 0616) BP: (119-130)/(61-94) 130/64 (04/10 0616) SpO2:  [100 %] 100 % (04/10 0616) Weight:  [85.6 kg] 85.6 kg (04/10 0500) Last BM Date : 08/03/23  Intake/Output from previous day: 04/09 0701 - 04/10 0700 In: 2365.2 [P.O.:980; I.V.:1385.2] Out: 800 [Urine:800] Intake/Output this shift: No intake/output data recorded.  Gen: NAD A&O OOB in chair Resp: breathing comfortably GI: soft, non distended, non tender.   Lab Results: CBC  No results for input(s): "WBC", "HGB", "HCT", "PLT" in the last 72 hours.  BMET Recent Labs    08/04/23 0244  NA 138  K 3.8  CL 108  CO2 25  GLUCOSE 96  BUN 27*  CREATININE 0.74  CALCIUM 8.5*   PT/INR No results for input(s): "LABPROT", "INR" in the last 72 hours. ABG No results for input(s): "PHART", "HCO3" in the last 72 hours.  Invalid input(s): "PCO2", "PO2"  Anti-infectives: Anti-infectives (From admission, onward)    None       Medications: Scheduled Meds:  acetaminophen  650 mg Oral Q6H   apixaban  5 mg Oral BID   Chlorhexidine Gluconate Cloth  6 each Topical Daily   copper  2 mg Oral Daily   docusate sodium  100 mg Oral BID   furosemide  20 mg Oral Daily   meclizine  12.5 mg Oral BID   multivitamin with minerals  1 tablet Oral Daily   nystatin   Topical TID   pantoprazole  40 mg Oral BID   Ensure Max Protein  11 oz Oral Daily   sucralfate  1 g Oral TID WC & HS   thiamine  500 mg Oral Daily   zinc sulfate (50mg  elemental zinc)  220 mg Oral Daily   Continuous Infusions:  TPN ADULT (ION) 65 mL/hr at 08/03/23 1743   TPN ADULT (ION)     PRN Meds:.alum & mag hydroxide-simeth, lip balm, ondansetron **OR** ondansetron (ZOFRAN) IV,  promethazine, simethicone, sodium chloride flush  Assessment/Plan: Ms. Leckrone has had a terrible year after a gastric bypass and is now inpatient for TPN while awaiting possible reversal.   Failure to thrive Severe protein calorie malnutrition.  - bariatric reg, TPN - Eliquis for h/o DVT - continue ambulation, pulm toilet - Follow up nutrition labs Anemia of chronic disease. - Plan for reversal of gastric bypass 08/12/23 at 730 this admission after interval of TPN   LOS: 8 days    Quentin Ore, MD  [facility] Use [email or web address] to contact on call provider

## 2023-08-04 NOTE — Progress Notes (Signed)
 Physical Therapy Treatment Patient Details Name: Christine [name] MRN: [id number] DOB: [birth year] Today's Date: 08/04/2023   History of Present Illness 73 y.o. female admitted with FTT, abdominal pain, inablilty to tolerate solids. Plan is for 2 weeks of TPN in prepartion for gastric bypass reversal. 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    PT Comments  Pt amb hallway distance with no sitting rest breaks, mild dyspnea end of distance. Plan is for reversal of gastric bypass on 4/18. Will continue to follow in acute setting. HHPT remains appropriate.    If plan is discharge home, recommend the following: A little help with bathing/dressing/bathroom;Assist for transportation;Assistance with cooking/housework;Help with stairs or ramp for entrance   Can travel by private vehicle        Equipment Recommendations  None recommended by PT    Recommendations for Other Services       Precautions / Restrictions Precautions Precautions: Fall Recall of Precautions/Restrictions: Intact Precaution/Restrictions Comments: pt reports multiple falls in past 6 months Restrictions Weight Bearing Restrictions Per Provider Order: No     Mobility  Bed Mobility Overal bed mobility: Modified Independent                  Transfers Overall transfer level: Needs assistance Equipment used: Rolling walker (2 wheels) Transfers: Sit to/from Stand Sit to Stand: Modified independent (Device/Increase time), Supervision           General transfer comment: supervision for safety; per pt and staff pt had fall from BSC-no longer has [facility] privileges    Ambulation/Gait Ambulation/Gait assistance: Supervision, Contact guard assist Gait Distance (Feet): 360 Feet (+10') Assistive device: Rollator (4 wheels) Gait Pattern/deviations: Step-through pattern, Decreased stride length, Decreased step length - left, Decreased step length - right, Trunk flexed Gait  velocity: decreased gait speed     General Gait Details: Pt begins with upright posture but demonstrates more forward flexion as she fatigues. no sitting rest breaks needed. mild dyspnea after above distance   Stairs             Wheelchair Mobility     Tilt Bed    Modified Rankin (Stroke Patients Only)       Balance   Sitting-balance support: Feet supported, No upper extremity supported Sitting balance-Leahy Scale: Good     Standing balance support: Reliant on assistive device for balance, During functional activity, No upper extremity supported Standing balance-Leahy Scale: Fair (fair + to good) Standing balance comment: performs peri hygiene in standing without LOB; ambulates a few steps BSC>sink without AD with supervision, no LOB                            Communication Communication Communication: No apparent difficulties  Cognition Arousal: Alert Behavior During Therapy: WFL for tasks assessed/performed   PT - Cognitive impairments: No apparent impairments                         Following commands: Intact      Cueing    Exercises      General Comments        Pertinent Vitals/Pain Pain Assessment Pain Assessment: No/denies pain    Home Living                          Prior Function  PT Goals (current goals can now be found in the care plan section) Acute Rehab PT Goals Patient Stated Goal: likes to sleep and watch tv PT Goal Formulation: With patient Time For Goal Achievement: 08/11/23 Potential to Achieve Goals: Good Progress towards PT goals: Progressing toward goals    Frequency    Min 2X/week      PT Plan      Co-evaluation              AM-PAC PT "6 Clicks" Mobility   Outcome Measure  Help needed turning from your back to your side while in a flat bed without using bedrails?: None Help needed moving from lying on your back to sitting on the side of a flat bed without  using bedrails?: None Help needed moving to and from a bed to a chair (including a wheelchair)?: None Help needed standing up from a chair using your arms (e.g., wheelchair or bedside chair)?: None Help needed to walk in hospital room?: A Little Help needed climbing 3-5 steps with a railing? : A Little 6 Click Score: 22    End of Session Equipment Utilized During Treatment: Gait belt Activity Tolerance: Patient tolerated treatment well;Patient limited by fatigue Patient left: in bed;with call bell/phone within reach;with bed alarm set;Other (comment) (fall mat) Nurse Communication: Mobility status PT Visit Diagnosis: Difficulty in walking, not elsewhere classified (R26.2);History of falling (Z91.81)     Time: 1191-4782 PT Time Calculation (min) (ACUTE ONLY): 21 min  Charges:    $Gait Training: 8-22 mins PT General Charges $$ ACUTE PT VISIT: 1 Visit                     Aviella Disbrow, PT  Acute Rehab Dept [facility]) [phone]  08/04/2023    [facility] 08/04/2023, 3:51 PM

## 2023-08-04 NOTE — Progress Notes (Signed)
 PHARMACY - TOTAL PARENTERAL NUTRITION CONSULT NOTE   Indication:  Intolerance to enteral feeding   Patient Measurements: Height: 5\' 1"  (154.9 cm) Weight: 85.6 kg (188 lb 11.4 oz) IBW/kg (Calculated) : 47.8 TPN AdjBW (KG): 56.4 Body mass index is 35.66 kg/m.  Assessment:  72yoF with hx gastric bypass in May 2024, weight at that time 131 kg. Pt has had rapid weight loss, inability to tolerate solid foods, complaints of severe weakness, and problems with gastrostomy tube displacement (Oct - mid-Nov) and ulcer repairs in Nov/Dec 2024. Now concern for stricture at marginal ulcer preventing intake of a regular post-operative diet, surgery team recommending reversal of bypass. 4/2 CT Abdomen c/w findings of prior gastric bypass without evidence of bowel obstruction  In total, ~50 kg loss since bypass. Pt took up to 100% of 2 meals on 4/4, no additional meal intake charted. Consult for 2 weeks of TPN prior to bypass reversal, planned for 4/18.   Glucose / Insulin: A1c 6.3 in May 2024, CBGs <100 Electrolytes: Na improved 138 (increased in TPN 4/7), Mg 2.0, CO2 25 (from 19), Cl 108, K 3.8 (from 4.3), Phos 4.4 (from 3.8) Renal: Scr 0.74, BUN 27 (baseline 26); lasix 20 mg PO daily Hepatic: LFTs WNL, Tbili <0.2, albumin 2.2 Intake / Output; MIVF: UOP 0.4 mL/kg/h, LBM 4/9 x2; Net + 9.4L Zinc 220 mg, copper 2 mg, thiamine 500 mg per RD PO multivitamin with minerals  GI Imaging: none since TPN initiation GI Surgeries / Procedures: none since TPN initiation  Central access: 07/27/23 TPN start date: 07/28/23   Nutritional Goals: Goal TPN rate is 65 mL/hr (provides 78 g of protein and 1,575.6 kcals per day)  RD Assessment: on 4/3 Estimated Needs Total Energy Estimated Needs: 1500-1800 Total Protein Estimated Needs: 75-90g Total Fluid Estimated Needs: 1.8L/day  Current Nutrition:  TPN + full liquid bariatric diet, protein supplement (Ensure)  Plan:  Continue TPN at 65 mL/hr at 1800 Electrolytes  in TPN: Na 100 mEq/L, small increase K to 45 mEq/L (total increase of ~7.5 mEq), Ca 5 mEq/L, Mg 7 mEq/L, decrease Phos 15 mmol/L (total decrease 7.5 mmol). Adj Cl:Ac 1:2 Remove standard MVI and trace elements from TPN Supplement multivitamin with minerals PO Monitor TPN labs on Mon/Thurs & PRN; next labs 4/14 AM  Rutherford Nail, PharmD PGY2 Critical Care Pharmacy Resident 08/04/2023 8:04 AM

## 2023-08-05 MED ORDER — TRAVASOL 10 % IV SOLN
INTRAVENOUS | Status: AC
  Filled 2023-08-05: qty 780

## 2023-08-05 NOTE — Progress Notes (Signed)
 PHARMACY - TOTAL PARENTERAL NUTRITION CONSULT NOTE   Indication:  Intolerance to enteral feeding   Patient Measurements: Height: 5\' 1"  (154.9 cm) Weight: 85.6 kg (188 lb 11.4 oz) IBW/kg (Calculated) : 47.8 TPN AdjBW (KG): 56.4 Body mass index is 35.66 kg/m.  Assessment:  72yoF with hx gastric bypass in May 2024, weight at that time 131 kg. Pt has had rapid weight loss, inability to tolerate solid foods, complaints of severe weakness, and problems with gastrostomy tube displacement (Oct - mid-Nov) and ulcer repairs in Nov/Dec 2024. Now concern for stricture at marginal ulcer preventing intake of a regular post-operative diet, surgery team recommending reversal of bypass. 4/2 CT Abdomen c/w findings of prior gastric bypass without evidence of bowel obstruction  In total, ~50 kg loss since bypass. Pt took up to 100% of 2 meals on 4/4, 4/9 and 4/10. Consult for 2 weeks of TPN prior to bypass reversal to optimize nutritional status, OR planned for 4/18.   Glucose / Insulin: A1c 6.3 in May 2024, CBGs <100 Electrolytes: 4/10 labs: Na improved 138 (increased in TPN 4/7), Mg 2.0, CO2 25 (from 19), Cl 108, K 3.8 (from 4.3), Phos 4.4 (from 3.8) Renal: Scr 0.74, BUN 27 (baseline 26); lasix 20 mg PO daily Hepatic: LFTs WNL, Tbili <0.2, albumin 2.2 Intake / Output; MIVF: UOP 0.3 mL/kg/h + 7 occurrences, LBM 4/10 x3; Net + 10.1L Zinc 220 mg, copper 2 mg, thiamine 500 mg per RD PO multivitamin with minerals  GI Imaging: none since TPN initiation GI Surgeries / Procedures: none since TPN initiation  Central access: 07/27/23 TPN start date: 07/28/23   Nutritional Goals: Goal TPN rate is 65 mL/hr (provides 78 g of protein and 1,575.6 kcals per day)  RD Assessment: on 4/3 Estimated Needs Total Energy Estimated Needs: 1500-1800 Total Protein Estimated Needs: 75-90g Total Fluid Estimated Needs: 1.8L/day  Current Nutrition:  TPN + full liquid bariatric diet, protein supplement (Ensure)  Plan:   Continue TPN at 65 mL/hr at 1800 Electrolytes in TPN: Na 100 mEq/L, K to 45 mEq/L (increased 4/10), Ca 5 mEq/L, Mg 7 mEq/L, Phos 15 mmol/L (decreased 4/10). Cl:Ac 1:2  Remove standard MVI and trace elements from TPN Supplement multivitamin with minerals PO Monitor TPN labs on Mon/Thurs & PRN; next labs 4/14 AM  Rutherford Nail, PharmD PGY2 Critical Care Pharmacy Resident 08/05/2023 8:04 AM

## 2023-08-05 NOTE — Progress Notes (Signed)
 Occupational Therapy Treatment Patient Details Name: Christine [name] MRN: [id number] DOB: 30-Apr-1950 Today's Date: 08/05/2023   History of present illness 73 y.o. female admitted with FTT, abdominal pain, inablilty to tolerate solids. Plan is for 2 weeks of TPN in prepartion for gastric bypass reversal. 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   OT comments  Patient seen for skilled OT session this afternoon. Patient open to all presented activity. Patient reports just having transferred back to bed and nursing confirmed. OT encouraged EOB light UB therex outlined below with no SOB or pain reported. Patient with positive outlook this session reporting she is motivated to get stronger. Patient continues to require skilled OT services while in Acute setting to maximize functional strength and performance. Patient will benefit from [facility] upon discharge once stable.       If plan is discharge home, recommend the following:  A little help with bathing/dressing/bathroom;Assistance with cooking/housework;Assist for transportation   Equipment Recommendations  None recommended by OT       Precautions / Restrictions Precautions Precautions: Fall Recall of Precautions/Restrictions: Intact Restrictions Weight Bearing Restrictions Per Provider Order: No       Mobility Bed Mobility Overal bed mobility: Needs Assistance       Supine to sit: Modified independent (Device/Increase time), HOB elevated, Used rails Sit to supine: Supervision, HOB elevated, Used rails        Transfers  Patient remained EOB with only brief sit to stand to adjust gown with close supervision                          Extremity/Trunk Assessment Upper Extremity Assessment Upper Extremity Assessment: Generalized weakness;Right hand dominant   Lower Extremity Assessment Lower Extremity Assessment: Generalized weakness         Cognition Arousal:  Alert Behavior During Therapy: WFL for tasks assessed/performed                                 Following commands: Intact        Cueing   Cueing Techniques: Verbal cues  Exercises Exercises: General Upper Extremity General Exercises - Upper Extremity Shoulder Flexion: 10 reps, Seated (EOB B UE light cane exercises using light dresisng stick. 10 reps B scap, sh, elbow wih rests in between and cues for pacin and breathing, 10 minute target toss with use of B UE's with stick in planes to challenge core muscle strength activity tolerance)            Pertinent Vitals/ Pain       Pain Assessment Pain Assessment: No/denies pain   Frequency  Min 2X/week        Progress Toward Goals  OT Goals(current goals can now be found in the care plan section)  Progress towards OT goals: Progressing toward goals  Acute Rehab OT Goals Patient Stated Goal: be strong for  potential surgery next week OT Goal Formulation: With patient Time For Goal Achievement: 08/11/23 Potential to Achieve Goals: Good ADL Goals Pt Will Perform Lower Body Bathing: with modified independence;with adaptive equipment;sit to/from stand Pt Will Perform Lower Body Dressing: with modified independence;sit to/from stand;with adaptive equipment Pt Will Transfer to Toilet: with modified independence;ambulating Additional ADL Goal #1: Pt will independently identify 3 strategies to reduce risk of falls Additional ADL Goal #2: Pt will independently identify 3 energy conservation techniques to increase independence and endurance  for ADL/IADL tasks   AM-PAC OT "6 Clicks" Daily Activity     Outcome Measure   Help from another person eating meals?: None Help from another person taking care of personal grooming?: A Little Help from another person toileting, which includes using toliet, bedpan, or urinal?: A Little Help from another person bathing (including washing, rinsing, drying)?: A Little Help from  another person to put on and taking off regular upper body clothing?: A Little Help from another person to put on and taking off regular lower body clothing?: A Little 6 Click Score: 19    End of Session Equipment Utilized During Treatment: Gait belt  OT Visit Diagnosis: Unsteadiness on feet (R26.81);Repeated falls (R29.6);Muscle weakness (generalized) (M62.81)   Activity Tolerance Patient limited by fatigue   Patient Left in bed;with call bell/phone within reach;with bed alarm set   Nurse Communication Mobility status        Time: 0981-1914 OT Time Calculation (min): 34 min  Charges: OT General Charges $OT Visit: 1 Visit OT Treatments $Therapeutic Exercise: 23-37 mins Keyona Emrich OT/L Acute Rehabilitation Department  [phone]  08/05/2023, 3:53 PM

## 2023-08-05 NOTE — Plan of Care (Signed)

## 2023-08-05 NOTE — Progress Notes (Signed)
 Progress Note: General Surgery Service   Chief Complaint/Subjective:  No acute events, no change.  Objective: Vital signs in last 24 hours: Temp:  [97.5 F (36.4 C)-98.8 F (37.1 C)] 97.5 F (36.4 C) (04/11 0537) Pulse Rate:  [80-103] 80 (04/11 0537) Resp:  [16-18] 16 (04/11 0537) BP: (109-132)/(63-67) 132/67 (04/11 0537) SpO2:  [100 %] 100 % (04/11 0537) Last BM Date : 08/05/23  Intake/Output from previous day: 04/10 0701 - 04/11 0700 In: 1367 [P.O.:720; I.V.:647] Out: 650 [Urine:650] Intake/Output this shift: No intake/output data recorded.  Gen: NAD Resp: breathing comfortably GI: soft, non distended, non tender.   Lab Results: CBC  No results for input(s): "WBC", "HGB", "HCT", "PLT" in the last 72 hours.  BMET Recent Labs    08/04/23 0244  NA 138  K 3.8  CL 108  CO2 25  GLUCOSE 96  BUN 27*  CREATININE 0.74  CALCIUM 8.5*   PT/INR No results for input(s): "LABPROT", "INR" in the last 72 hours. ABG No results for input(s): "PHART", "HCO3" in the last 72 hours.  Invalid input(s): "PCO2", "PO2"  Anti-infectives: Anti-infectives (From admission, onward)    None       Medications: Scheduled Meds:  acetaminophen  650 mg Oral Q6H   apixaban  5 mg Oral BID   Chlorhexidine Gluconate Cloth  6 each Topical Daily   copper  2 mg Oral Daily   docusate sodium  100 mg Oral BID   furosemide  20 mg Oral Daily   meclizine  12.5 mg Oral BID   multivitamin with minerals  1 tablet Oral Daily   nystatin   Topical TID   pantoprazole  40 mg Oral BID   Ensure Max Protein  11 oz Oral Daily   sucralfate  1 g Oral TID WC & HS   thiamine  500 mg Oral Daily   zinc sulfate (50mg  elemental zinc)  220 mg Oral Daily   Continuous Infusions:  TPN ADULT (ION) 65 mL/hr at 08/04/23 1749   PRN Meds:.alum & mag hydroxide-simeth, lip balm, ondansetron **OR** ondansetron (ZOFRAN) IV, promethazine, simethicone, sodium chloride flush  Assessment/Plan: [name] has had a  terrible year after a gastric bypass and is now inpatient for TPN while awaiting possible reversal.   Failure to thrive Severe protein calorie malnutrition.  - bariatric reg, TPN - Eliquis for h/o DVT - stop eliquis on 4/15 for surgery on 4/18 - continue ambulation, pulm toilet - Follow up nutrition labs Anemia of chronic disease. - Plan for reversal of gastric bypass 08/12/23 at 730 this admission after interval of TPN   LOS: 9 days    Christine Ore, MD  [facility] Use [email or web address] to contact on call provider

## 2023-08-06 MED ORDER — TRAVASOL 10 % IV SOLN
INTRAVENOUS | Status: AC
  Filled 2023-08-06: qty 780

## 2023-08-06 NOTE — Progress Notes (Signed)
 PHARMACY - TOTAL PARENTERAL NUTRITION CONSULT NOTE   Indication:  Intolerance to enteral feeding   Patient Measurements: Height: 5\' 1"  (154.9 cm) Weight: 88.2 kg (194 lb 7.1 oz) IBW/kg (Calculated) : 47.8 TPN AdjBW (KG): 56.4 Body mass index is 36.74 kg/m.  Assessment:  72yoF with hx gastric bypass in May 2024, weight at that time 131 kg. Pt has had rapid weight loss, inability to tolerate solid foods, complaints of severe weakness, and problems with gastrostomy tube displacement (Oct - mid-Nov) and ulcer repairs in Nov/Dec 2024. Now concern for stricture at marginal ulcer preventing intake of a regular post-operative diet, surgery team recommending reversal of bypass. 4/2 CT Abdomen c/w findings of prior gastric bypass without evidence of bowel obstruction  In total, ~50 kg loss since bypass. Pt took up to 100% of 2 meals on 4/4, 4/9 and 4/10. Consult for 2 weeks of TPN prior to bypass reversal to optimize nutritional status, OR planned for 4/18.   Glucose / Insulin: A1c 6.3 in May 2024, CBGs 90-100s (last on 4/9, CBGs d/c) Electrolytes: 4/10 labs: Na improved 138 (increased in TPN 4/7), others WNL including K 3.8, Mg, Phos, CO2, Cl.  Renal: Scr 0.74, BUN 27 (baseline 26); lasix 20 mg PO daily Hepatic: LFTs WNL, Tbili <0.2, albumin 2.2 Intake / Output; MIVF:  - No mIVF, lasix daily - Docusate BID, last BM 4/12 - UOP not recorded on 4/11 (x5 occurrences) - Zinc 220 mg, copper 2 mg, thiamine 500 mg, PO multivitamin with minerals per RD - Bariatric diet:  eating 75-100% of meals + Ensure daily  GI Imaging: none since TPN initiation GI Surgeries / Procedures: none since TPN initiation  Central access: 07/27/23 TPN start date: 07/28/23   Nutritional Goals: Goal TPN rate is 65 mL/hr (provides 78 g of protein and 1,575.6 kcals per day)  RD Assessment: on 4/3 Estimated Needs Total Energy Estimated Needs: 1500-1800 Total Protein Estimated Needs: 75-90g Total Fluid Estimated Needs:  1.8L/day  Current Nutrition:  TPN + full liquid bariatric diet, protein supplement (Ensure)  Plan:  Continue TPN at 65 mL/hr at 1800 Electrolytes in TPN: Na 100 mEq/L, K to 45 mEq/L (increased 4/10), Ca 5 mEq/L, Mg 7 mEq/L, Phos 15 mmol/L (decreased 4/10). Cl:Ac 1:2  Monitor TPN labs on Mon/Thurs & PRN; next labs 4/14 AM   Kendall Pauls PharmD, BCPS [facility] main pharmacy [phone] 08/06/2023 10:05 AM

## 2023-08-06 NOTE — Progress Notes (Signed)
   Subjective/Chief Complaint: No complaints   Objective: Vital signs in last 24 hours: Temp:  [97.5 F (36.4 C)-98.6 F (37 C)] 98.6 F (37 C) (04/12 0550) Pulse Rate:  [82-109] 82 (04/12 0550) Resp:  [14-18] 14 (04/12 0550) BP: (106-160)/(69-95) 160/95 (04/12 0550) SpO2:  [97 %-100 %] 100 % (04/12 0550) Weight:  [88.2 kg] 88.2 kg (04/12 0500) Last BM Date : 08/05/23  Intake/Output from previous day: 04/11 0701 - 04/12 0700 In: 2882.9 [P.O.:1560; I.V.:1322.9] Out: -  Intake/Output this shift: Total I/O In: -  Out: 200 [Urine:200]  General appearance: alert and cooperative Resp: clear to auscultation bilaterally Cardio: regular rate and rhythm GI: soft, nontender  Lab Results:  No results for input(s): "WBC", "HGB", "HCT", "PLT" in the last 72 hours. BMET Recent Labs    08/04/23 0244  NA 138  K 3.8  CL 108  CO2 25  GLUCOSE 96  BUN 27*  CREATININE 0.74  CALCIUM 8.5*   PT/INR No results for input(s): "LABPROT", "INR" in the last 72 hours. ABG No results for input(s): "PHART", "HCO3" in the last 72 hours.  Invalid input(s): "PCO2", "PO2"  Studies/Results: No results found.  Anti-infectives: Anti-infectives (From admission, onward)    None       Assessment/Plan: s/p Procedure(s) with comments: GASTROPEXY, ROBOT-ASSISTED, LAPAROSCOPIC (N/A) - ROBOTIC REVERSAL OF GASTRIC BYPASS Continue tpn for nutrition support Plan for surgery 4/18  LOS: 10 days    Lillette Reid III 08/06/2023

## 2023-08-06 NOTE — Progress Notes (Signed)
 Mobility Specialist - Progress Note   08/06/23 0938  Mobility  Activity Ambulated with assistance in hallway  Level of Assistance Standby assist, set-up cues, supervision of patient - no hands on  Assistive Device Four wheel walker  Distance Ambulated (ft) 275 ft  Activity Response Tolerated well  Mobility Referral Yes  Mobility visit 1 Mobility  Mobility Specialist Start Time (ACUTE ONLY) [id number]  Mobility Specialist Stop Time (ACUTE ONLY) 0936  Mobility Specialist Time Calculation (min) (ACUTE ONLY) 15 min   Pt received in bed and agreeable to mobility. No complaints during session. Pt took x1 standing rest break during session. Pt to bed after session with all needs met.     [facility]

## 2023-08-07 DIAGNOSIS — Z8719 Personal history of other diseases of the digestive system: Secondary | ICD-10-CM

## 2023-08-07 MED ORDER — TRAVASOL 10 % IV SOLN
INTRAVENOUS | Status: AC
  Filled 2023-08-07: qty 780

## 2023-08-07 NOTE — Plan of Care (Signed)

## 2023-08-07 NOTE — Plan of Care (Signed)

## 2023-08-07 NOTE — Progress Notes (Signed)
 PHARMACY - TOTAL PARENTERAL NUTRITION CONSULT NOTE   Indication:  Intolerance to enteral feeding   Patient Measurements: Height: 5\' 1"  (154.9 cm) Weight: 87.4 kg (192 lb 11.2 oz) IBW/kg (Calculated) : 47.8 TPN AdjBW (KG): 56.4 Body mass index is 36.41 kg/m.  Assessment:  72yoF with hx gastric bypass in May 2024, weight at that time 131 kg. Pt has had rapid weight loss, inability to tolerate solid foods, complaints of severe weakness, and problems with gastrostomy tube displacement (Oct - mid-Nov) and ulcer repairs in Nov/Dec 2024. Now concern for stricture at marginal ulcer preventing intake of a regular post-operative diet, surgery team recommending reversal of bypass. 4/2 CT Abdomen c/w findings of prior gastric bypass without evidence of bowel obstruction  In total, ~50 kg loss since bypass. Pt took up to 100% of 2 meals on 4/4, 4/9 and 4/10. Consult for 2 weeks of TPN prior to bypass reversal to optimize nutritional status, OR planned for 4/18.   Glucose / Insulin: A1c 6.3 in May 2024, CBGs 90-100s (last on 4/9, CBGs d/c) Electrolytes: 4/10 labs: Na improved 138 (increased in TPN 4/7), others WNL including K 3.8, Mg, Phos, CO2, Cl.  Renal: Scr 0.74, BUN 27 (baseline 26) Hepatic: LFTs WNL, Tbili <0.2, albumin 2.2 Intake / Output; MIVF:  - No mIVF, lasix daily - Docusate BID, last BM 4/12 - UOP 1L (x2 occurrences) - Zinc 220 mg, copper 2 mg, thiamine 500 mg, PO multivitamin with minerals per RD - Bariatric diet:  eating 75-100% of meals + Ensure daily  GI Imaging: none since TPN initiation GI Surgeries / Procedures: none since TPN initiation  Central access: 07/27/23 TPN start date: 07/28/23   Nutritional Goals: Goal TPN rate is 65 mL/hr (provides 78 g of protein and 1,575.6 kcals per day)  RD Assessment: on 4/3 Estimated Needs Total Energy Estimated Needs: 1500-1800 Total Protein Estimated Needs: 75-90g Total Fluid Estimated Needs: 1.8L/day  Current Nutrition:  TPN + full  liquid bariatric diet, protein supplement (Ensure)  Plan:  Continue TPN at 65 mL/hr at 1800 Electrolytes in TPN: Na 100 mEq/L, K to 45 mEq/L (increased 4/10), Ca 5 mEq/L, Mg 7 mEq/L, Phos 15 mmol/L (decreased 4/10). Cl:Ac 1:2  Monitor TPN labs on Mon/Thurs & PRN; next labs 4/14 AM   Kendall Pauls PharmD, BCPS [facility] main pharmacy [phone] 08/07/2023 8:40 AM

## 2023-08-07 NOTE — Progress Notes (Signed)
 08/07/2023  Christine [name] [id number] [birth year]  CARE TEAM: PCP: Jobe Mulder, DO  Outpatient Care Team: Patient Care Team: Jobe Mulder, DO as PCP - General (Family Medicine) Alane Allen Sara E, PA-C (Neurology) Wilder Handy, MD (Inactive) as Consulting Physician (Pulmonary Disease) Richrd Char, MD (Inactive) (Vascular Surgery) Gaynelle Keeling, MD as Consulting Physician (Dermatology) Dot Gazella, DPM as Consulting Physician (Podiatry)  Inpatient Treatment Team: Treatment Team:  Stechschulte, Avon Boers, MD Missy Amos, NT Percilla Boys, LPN Rhesa Celeste, NT Viann Graces, RN Ramirez-Flores, Ewell Holes, Rolanda T, LCSW   Problem List:   Principal Problem:   Failure to thrive in adult Active Problems:   Protein-calorie malnutrition, severe   08/12/2023  Procedure(s): GASTROPEXY, ROBOT-ASSISTED, LAPAROSCOPIC    Assessment [facility] Stay = 11 days)      Severe protein calorie malnutrition in the setting of failure to thrive with gastric bypass complicated with marginal ulcers etc.  Will for now    Plan:  Liquids as tolerated.  TPN to get full nutrition to hopefully correct severe protein malnutrition.  PPI to help protect gastrojejunostomy given history of prior marginal ulcers  Most likely reversal of gastric bypass once nutrition numbers are improved.  Hopefully later in the week.  Dr. Marny Sires to return tomorrow to help coordinate and lead care.  Apixaban for history of DVTs.  Will defer when that is held prior to surgery.  Usually 48 hours.  Hypertension control and diuresis  -monitor electrolytes & replace as needed  Keep K>4, Mg>2, Phos>3  -VTE prophylaxis- SCDs.  Anticoagulation prophyllaxis SQ as appropriate  -mobilize as tolerated to help recovery.  Enlist therapies in moderate/high risk patients as appropriate  I updated the patient's status to the patient  Recommendations were made.   Questions were answered.  She expressed understanding & appreciation.  -Disposition:       I reviewed nursing notes, last 24 h vitals and pain scores, last 48 h intake and output, last 24 h labs and trends, and last 24 h imaging results.  I have reviewed this patient's available data, including medical history, events of note, test results, etc as part of my evaluation.   A significant portion of that time was spent in counseling. Care during the described time interval was provided by me.  This care required moderate level of medical decision making.  08/07/2023    Subjective: (Chief complaint)  No acute events.  Feels bored.  Tolerating liquids.  Objective:  Vital signs:  Vitals:   08/06/23 1404 08/06/23 2205 08/07/23 0500 08/07/23 0530  BP: (!) 148/83 125/71  (!) 158/90  Pulse: 99 92  85  Resp: 18 18  15   Temp: 97.7 F (36.5 C) 98 F (36.7 C)  98.8 F (37.1 C)  TempSrc: Oral Oral  Oral  SpO2: 100% 100%  100%  Weight:   87.4 kg   Height:        Last BM Date : 08/06/23  Intake/Output   Yesterday:  04/12 0701 - 04/13 0700 In: 2223.1 [P.O.:960; I.V.:1263.1] Out: 1000 [Urine:1000] This shift:  Total I/O In: 534.7 [I.V.:534.7] Out: -   Bowel function:  Flatus: YES  BM:  No  Drain: (No drain)   Physical Exam:  General: Pt awake/alert in no acute distress Eyes: PERRL, normal EOM.  Sclera clear.  No icterus Neuro: CN II-XII intact w/o focal sensory/motor deficits. Lymph: No head/neck/groin lymphadenopathy Psych:  No delerium/psychosis/paranoia.  Oriented x 4 HENT: Normocephalic, Mucus membranes  moist.  No thrush Neck: Supple, No tracheal deviation.  No obvious thyromegaly Chest: No pain to chest wall compression.  Good respiratory excursion.  No audible wheezing CV:  Pulses intact.  Regular rhythm.  No major extremity edema MS: Normal AROM mjr joints.  No obvious deformity  Abdomen: Soft.  Nondistended.  Nontender.  No evidence of peritonitis.   No incarcerated hernias.  Ext:   No deformity.  No mjr edema.  No cyanosis Skin: No petechiae / purpurea.  No major sores.  Warm and dry    Results:   Cultures: No results found for this or any previous visit (from the past 720 hours).  Labs: No results found for this or any previous visit (from the past 48 hours).  Imaging / Studies: No results found.  Medications / Allergies: per chart  Antibiotics: Anti-infectives (From admission, onward)    None         Note: Portions of this report may have been transcribed using voice recognition software. Every effort was made to ensure accuracy; however, inadvertent computerized transcription errors may be present.   Any transcriptional errors that result from this process are unintentional.    Eddye Goodie, MD, FACS, MASCRS Esophageal, Gastrointestinal & Colorectal Surgery Robotic and Minimally Invasive Surgery  [facility] [address] [place], [place] [address] [phone] Fax [phone] Main  CONTACT INFORMATION: Weekday (9AM-5PM): Call [facility] main office at [phone] Weeknight (5PM-9AM) or Weekend/Holiday: Check EPIC "Web Links" tab & use "AMION" (password " [email or web address]") for General Surgery [facility] coverage  Please, DO NOT use SecureChat  (it is not reliable communication to reach operating surgeons & will lead to a delay in care).   Epic staff messaging available for outptient concerns needing 1-2 business day response.      08/07/2023  10:42 AM
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
## 2023-08-08 LAB — CBC
HCT: 23.3 % — ABNORMAL LOW (ref 36.0–46.0)
Hemoglobin: 7.1 g/dL — ABNORMAL LOW (ref 12.0–15.0)
MCH: 29.7 pg (ref 26.0–34.0)
MCHC: 30.5 g/dL (ref 30.0–36.0)
MCV: 97.5 fL (ref 80.0–100.0)
Platelets: 215 10*3/uL (ref 150–400)
RBC: 2.39 MIL/uL — ABNORMAL LOW (ref 3.87–5.11)
RDW: 18.2 % — ABNORMAL HIGH (ref 11.5–15.5)
WBC: 4.6 10*3/uL (ref 4.0–10.5)
nRBC: 0 % (ref 0.0–0.2)

## 2023-08-08 LAB — COMPREHENSIVE METABOLIC PANEL WITH GFR
ALT: 18 U/L (ref 0–44)
AST: 32 U/L (ref 15–41)
Albumin: 2.2 g/dL — ABNORMAL LOW (ref 3.5–5.0)
Alkaline Phosphatase: 85 U/L (ref 38–126)
Anion gap: 8 (ref 5–15)
BUN: 23 mg/dL (ref 8–23)
CO2: 26 mmol/L (ref 22–32)
Calcium: 8.7 mg/dL — ABNORMAL LOW (ref 8.9–10.3)
Chloride: 105 mmol/L (ref 98–111)
Creatinine, Ser: 0.85 mg/dL (ref 0.44–1.00)
GFR, Estimated: >60 mL/min (ref >60)
Glucose, Bld: 93 mg/dL (ref 70–99)
Potassium: 3.8 mmol/L (ref 3.5–5.1)
Sodium: 139 mmol/L (ref 135–145)
Total Bilirubin: 0.4 mg/dL (ref 0.0–1.2)
Total Protein: 5.3 g/dL — ABNORMAL LOW (ref 6.5–8.1)

## 2023-08-08 LAB — 25-HYDROXY VITAMIN D LCMS D2+D3
25-Hydroxy, Vitamin D-2: 3.8 ng/mL
25-Hydroxy, Vitamin D-3: 34 ng/mL
25-Hydroxy, Vitamin D: 38 ng/mL

## 2023-08-08 LAB — MAGNESIUM: Magnesium: 1.9 mg/dL (ref 1.7–2.4)

## 2023-08-08 LAB — PREALBUMIN: Prealbumin: 29 mg/dL (ref 18–38)

## 2023-08-08 LAB — PHOSPHORUS: Phosphorus: 5 mg/dL — ABNORMAL HIGH (ref 2.5–4.6)

## 2023-08-08 LAB — TRIGLYCERIDES: Triglycerides: 340 mg/dL — ABNORMAL HIGH (ref <150)

## 2023-08-08 MED ORDER — TRAVASOL 10 % IV SOLN
INTRAVENOUS | Status: AC
  Filled 2023-08-08: qty 780

## 2023-08-08 NOTE — Progress Notes (Signed)
 Occupational Therapy Treatment Patient Details Name: Christine [name] MRN: [id number] DOB: 1950-08-10 Today's Date: 08/08/2023   History of present illness 73 y.o. female admitted with FTT, abdominal pain, inablilty to tolerate solids. Plan is for 2 weeks of TPN in prepartion for gastric bypass reversal. 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   OT comments  Patient seen this afternoon for OT session. Patient motivated for novel therapeutic exercises and tolerated at EOB. Patient progressed with light resistance for B UE's with cues for pacing and sow position changes due to prolonged bed rest. Encouraged OOB to chair but patient requested to end session back to bed level after completion of session and activity outlined below. Patient continues to require Acute level skilled OT services to progress strength and function and anticipate will benefit from [facility] services once discharged and medically stable.       If plan is discharge home, recommend the following:  A little help with bathing/dressing/bathroom;Assistance with cooking/housework;Assist for transportation   Equipment Recommendations  None recommended by OT       Precautions / Restrictions Precautions Precautions: Fall Recall of Precautions/Restrictions: Intact Restrictions Weight Bearing Restrictions Per Provider Order: No       Mobility Bed Mobility Overal bed mobility: Needs Assistance Bed Mobility: Supine to Sit, Sit to Supine Rolling: Modified independent (Device/Increase time)   Supine to sit: Supervision Sit to supine: Supervision   General bed mobility comments: HOB up, used rails; assist for BLEs into bed and sitting EOB for new theraputty activity on tray table    Transfers Overall transfer level: Needs assistance Equipment used: Rollator (4 wheels) Transfers: Sit to/from Stand, Bed to chair/wheelchair/BSC Sit to Stand: Supervision     Step pivot transfers:  Supervision           Balance Overall balance assessment: Needs assistance Sitting-balance support: Feet supported, No upper extremity supported Sitting balance-Leahy Scale: Good     Standing balance support: No upper extremity supported, Bilateral upper extremity supported Standing balance-Leahy Scale: Fair                             ADL either performed or assessed with clinical judgement   ADL Overall ADL's : Needs assistance/impaired                         Toilet Transfer: Supervision/safety;Ambulation Toilet Transfer Details (indicate cue type and reason): SPT to [facility] Toileting- Clothing Manipulation and Hygiene: Supervision/safety       Functional mobility during ADLs: Supervision/safety General ADL Comments: cues for integration of pacing and increaing time between position changes due to prolonged bed rest    Extremity/Trunk Assessment Upper Extremity Assessment Upper Extremity Assessment: Generalized weakness   Lower Extremity Assessment Lower Extremity Assessment: Generalized weakness                 Communication Communication Communication: No apparent difficulties   Cognition Arousal: Alert Behavior During Therapy: WFL for tasks assessed/performed                                 Following commands: Intact        Cueing   Cueing Techniques: Verbal cues  Exercises Exercises: General Upper Extremity General Exercises - Upper Extremity Elbow Extension: 10 reps, Theraband (L 1 x 3 sets with min cues for technique and  pacing) Digit Composite Flexion: Other (comment), Strengthening, Right, Left (B theraputty yellow (low) resistance grasping, rolling, pinching, spreading for instrinsic hand strength and overall cdp management due to prolonged bedrest)       General Comments SpO2 97% on RA during sitting and bed level UE therex/act    Pertinent Vitals/ Pain       Pain Assessment Pain Assessment: No/denies  pain   Frequency  Min 2X/week        Progress Toward Goals  OT Goals(current goals can now be found in the care plan section)  Progress towards OT goals: Progressing toward goals  Acute Rehab OT Goals Patient Stated Goal: to be able to go home after surgery OT Goal Formulation: With patient Time For Goal Achievement: 08/11/23 Potential to Achieve Goals: Good ADL Goals Pt Will Perform Lower Body Bathing: with modified independence;with adaptive equipment;sit to/from stand Pt Will Perform Lower Body Dressing: with modified independence;sit to/from stand;with adaptive equipment Pt Will Transfer to Toilet: with modified independence;ambulating Additional ADL Goal #1: Pt will independently identify 3 strategies to reduce risk of falls Additional ADL Goal #2: Pt will independently identify 3 energy conservation techniques to increase independence and endurance for ADL/IADL tasks  Plan         AM-PAC OT "6 Clicks" Daily Activity     Outcome Measure   Help from another person eating meals?: None Help from another person taking care of personal grooming?: A Little Help from another person toileting, which includes using toliet, bedpan, or urinal?: A Little Help from another person bathing (including washing, rinsing, drying)?: A Little Help from another person to put on and taking off regular upper body clothing?: A Little Help from another person to put on and taking off regular lower body clothing?: A Little 6 Click Score: 19    End of Session Equipment Utilized During Treatment: Gait belt  OT Visit Diagnosis: Unsteadiness on feet (R26.81);Repeated falls (R29.6);Muscle weakness (generalized) (M62.81)   Activity Tolerance Patient limited by fatigue   Patient Left in bed;with call bell/phone within reach;with bed alarm set   Nurse Communication Mobility status        Time: 1610-9604 OT Time Calculation (min): 27 min  Charges: OT General Charges $OT Visit: 1 Visit OT  Treatments $Therapeutic Exercise: 23-37 mins  Dally Oshel OT/L Acute Rehabilitation Department  [phone]  08/08/2023, 2:51 PM

## 2023-08-08 NOTE — Progress Notes (Signed)
 PHARMACY - TOTAL PARENTERAL NUTRITION CONSULT NOTE   Indication:  Intolerance to enteral feeding   Patient Measurements: Height: 5\' 1"  (154.9 cm) Weight: 87.1 kg (192 lb 0.3 oz) IBW/kg (Calculated) : 47.8 TPN AdjBW (KG): 56.4 Body mass index is 36.28 kg/m.  Assessment:  72yoF with hx gastric bypass in May 2024, weight at that time 131 kg. Pt has had rapid weight loss, inability to tolerate solid foods, complaints of severe weakness, and problems with gastrostomy tube displacement (Oct - mid-Nov) and ulcer repairs in Nov/Dec 2024. Now concern for stricture at marginal ulcer preventing intake of a regular post-operative diet, surgery team recommending reversal of bypass. 4/2 CT Abdomen c/w findings of prior gastric bypass without evidence of bowel obstruction  In total, ~50 kg loss since bypass. Pt took up to 100% of 2 meals on 4/4, 4/9 and 4/10. Consult for 2 weeks of TPN prior to bypass reversal to optimize nutritional status, OR planned for 4/18.   Glucose / Insulin: A1c 6.3 in May 2024; not on insulin  -  CBGs (goal <150): 93 from CMET Electrolytes: CorrCa up 10.14, phos elevated at 5 - other lytes wnl Renal: Scr 0.85, BUN wnl Hepatic: LFTs WNL, Tbili wnl, albumin 2.2 - TG up 340 (on 4/14) Intake / Output; MIVF:  - No mIVF, lasix daily - Docusate BID, last BM 4/13 - UOP: 400 mL  - I/O: +2035 mL - Zinc 220 mg, copper 2 mg, thiamine 500 mg, PO multivitamin with minerals per RD - Bariatric diet:  eating 75-100% of meals + Ensure daily  GI Imaging: none since TPN initiation GI Surgeries / Procedures: none since TPN initiation  Central access: 07/27/23 TPN start date: 07/28/23   Nutritional Goals: Goal TPN rate is 65 mL/hr (provides 78 g of protein and 1,575 kcals per day)  RD Assessment: on 4/3 Estimated Needs Total Energy Estimated Needs: 1500-1800 Total Protein Estimated Needs: 75-90g Total Fluid Estimated Needs: 1.8L/day  Current Nutrition:  TPN + full liquid bariatric  diet, protein supplement (Ensure)  Plan:   At 1800:  - Continue TPN at 65 mL/hr at 1800 - Electrolytes in TPN:  Na 100 mEq/L K 45 mEq/L  Decrease Ca  to 3 mEq/L Mg 7 mEq/L Remove Phos Cl:Ac 1:2  - Monitor TPN labs on Mon/Thurs & PRN - CMET, phos, mag and Triglyceride on 4/15  Sharlyn Deaner, PharmD, BCPS 08/08/2023 7:42 AM

## 2023-08-08 NOTE — Progress Notes (Signed)
 Progress Note: General Surgery Service   Chief Complaint/Subjective:  No acute events, no change.  Objective: Vital signs in last 24 hours: Temp:  [97.7 F (36.5 C)-98.5 F (36.9 C)] 98.5 F (36.9 C) (04/14 0538) Pulse Rate:  [83-102] 92 (04/14 0538) Resp:  [16-18] 16 (04/14 0538) BP: (115-162)/(55-98) 162/98 (04/14 0538) SpO2:  [99 %-100 %] 100 % (04/14 0538) Weight:  [87.1 kg] 87.1 kg (04/14 0500) Last BM Date : 08/07/23  Intake/Output from previous day: 04/13 0701 - 04/14 0700 In: 2435.4 [P.O.:600; I.V.:1835.4] Out: 400 [Urine:400] Intake/Output this shift: Total I/O In: 0  Out: 800 [Urine:800]  Gen: NAD Resp: breathing comfortably GI: soft, non distended, non tender.   Lab Results: CBC  Recent Labs    08/08/23 0302  WBC 4.6  HGB 7.1*  HCT 23.3*  PLT 215    BMET Recent Labs    08/08/23 0302  NA 139  K 3.8  CL 105  CO2 26  GLUCOSE 93  BUN 23  CREATININE 0.85  CALCIUM 8.7*   PT/INR No results for input(s): "LABPROT", "INR" in the last 72 hours. ABG No results for input(s): "PHART", "HCO3" in the last 72 hours.  Invalid input(s): "PCO2", "PO2"  Anti-infectives: Anti-infectives (From admission, onward)    None       Medications: Scheduled Meds:  acetaminophen  650 mg Oral Q6H   apixaban  5 mg Oral BID   Chlorhexidine Gluconate Cloth  6 each Topical Daily   copper  2 mg Oral Daily   docusate sodium  100 mg Oral BID   furosemide  20 mg Oral Daily   meclizine  12.5 mg Oral BID   multivitamin with minerals  1 tablet Oral Daily   nystatin   Topical TID   pantoprazole  40 mg Oral BID   Ensure Max Protein  11 oz Oral Daily   sucralfate  1 g Oral TID WC & HS   thiamine  500 mg Oral Daily   zinc sulfate (50mg  elemental zinc)  220 mg Oral Daily   Continuous Infusions:  TPN ADULT (ION) 65 mL/hr at 08/07/23 1740   PRN Meds:.alum & mag hydroxide-simeth, lip balm, ondansetron **OR** ondansetron (ZOFRAN) IV, promethazine, simethicone, sodium  chloride flush  Assessment/Plan: Christine [name] has had a terrible year after a gastric bypass and is now inpatient for TPN while awaiting possible reversal.   Failure to thrive Severe protein calorie malnutrition.  - bariatric reg, TPN - Eliquis for h/o DVT - stop eliquis on 4/15 for surgery on 4/18 - continue ambulation, pulm toilet - Follow up nutrition labs Anemia of chronic disease. - Plan for reversal of gastric bypass 08/12/23 at 730 this admission after interval of TPN   LOS: 12 days    [name], MD  [facility] Use [email or web address] to contact on call provider

## 2023-08-08 NOTE — Progress Notes (Signed)
 Physical Therapy Treatment Patient Details Name: Christine [name] MRN: [id number] DOB: 19-Jul-1950 Today's Date: 08/08/2023   History of Present Illness 73 y.o. female admitted with FTT, abdominal pain, inablilty to tolerate solids. Plan is for 2 weeks of TPN in prepartion for gastric bypass reversal. 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    PT Comments  Pt continues to ambulate with rollator and supervision. Tolerated well with less dyspnea today and performed multiple STS.  Will maintain on therapy caseload to advance as able prior to surgery and for f/u after surgery.     If plan is discharge home, recommend the following: A little help with bathing/dressing/bathroom;Assist for transportation;Assistance with cooking/housework;Help with stairs or ramp for entrance   Can travel by private vehicle        Equipment Recommendations  None recommended by PT    Recommendations for Other Services       Precautions / Restrictions Precautions Precautions: Fall     Mobility  Bed Mobility Overal bed mobility: Needs Assistance Bed Mobility: Supine to Sit, Sit to Supine     Supine to sit: Supervision Sit to supine: Supervision        Transfers Overall transfer level: Needs assistance Equipment used: Rollator (4 wheels) Transfers: Sit to/from Stand Sit to Stand: Supervision           General transfer comment: STS x 3 during session; performed toileting ADLS wtihout assist    Ambulation/Gait Ambulation/Gait assistance: Supervision Gait Distance (Feet): 400 Feet Assistive device: Rollator (4 wheels) Gait Pattern/deviations: Step-through pattern Gait velocity: decreased gait speed     General Gait Details: Tolerated well only mild dyspnea at end of walk, cues for upright posture - able to correct   Stairs             Wheelchair Mobility     Tilt Bed    Modified Rankin (Stroke Patients Only)       Balance  Overall balance assessment: Needs assistance Sitting-balance support: Feet supported, No upper extremity supported Sitting balance-Leahy Scale: Good     Standing balance support: No upper extremity supported, Bilateral upper extremity supported Standing balance-Leahy Scale: Fair Standing balance comment: rollator to ambulate ; ADLs without UE support                            Communication    Cognition Arousal: Alert Behavior During Therapy: WFL for tasks assessed/performed   PT - Cognitive impairments: No apparent impairments                                Cueing    Exercises      General Comments General comments (skin integrity, edema, etc.): VSS      Pertinent Vitals/Pain Pain Assessment Pain Assessment: No/denies pain    Home Living                          Prior Function            PT Goals (current goals can now be found in the care plan section) Progress towards PT goals: Progressing toward goals    Frequency    Min 2X/week      PT Plan      Co-evaluation              AM-PAC PT "6 Clicks"  Mobility   Outcome Measure  Help needed turning from your back to your side while in a flat bed without using bedrails?: None Help needed moving from lying on your back to sitting on the side of a flat bed without using bedrails?: None Help needed moving to and from a bed to a chair (including a wheelchair)?: None Help needed standing up from a chair using your arms (e.g., wheelchair or bedside chair)?: None Help needed to walk in hospital room?: A Little Help needed climbing 3-5 steps with a railing? : A Little 6 Click Score: 22    End of Session Equipment Utilized During Treatment: Gait belt Activity Tolerance: Patient tolerated treatment well Patient left: in bed;with call bell/phone within reach;with bed alarm set Nurse Communication: Mobility status PT Visit Diagnosis: Difficulty in walking, not elsewhere  classified (R26.2);History of falling (Z91.81)     Time: 1203-1219 PT Time Calculation (min) (ACUTE ONLY): 16 min  Charges:    $Gait Training: 8-22 mins PT General Charges $$ ACUTE PT VISIT: 1 Visit                     Cyd Dowse, PT Acute Rehab [facility] [phone]    Carolynn Citrin 08/08/2023, 12:35 PM

## 2023-08-08 NOTE — TOC Progression Note (Signed)
 Transition of Care [facility]) - Progression Note    Patient Details  Name: Christine [name] MRN: [id number] Date of Birth: 19-Mar-1951  Transition of Care [facility]) CM/SW Contact  Bari Leys, RN Phone Number: 08/08/2023, 1:56 PM  Clinical Narrative:  Per surgery 4/14 progress notes: Plan for reversal of gastric bypass 08/12/23 at 730 this admission after interval of TPN. TOC will continue to follow.       Expected Discharge Plan: Home w Home Health Services Barriers to Discharge: Continued Medical Work up  Expected Discharge Plan and Services       Living arrangements for the past 2 months: Single Family Home                           HH Arranged: [facility] Agency: [facility] Date HH Agency Contacted: 07/28/23 Time HH Agency Contacted: 1257 Representative spoke with at [facility] Agency: Imelda Man   Social Determinants of Health (SDOH) Interventions SDOH Screenings   Food Insecurity: Food Insecurity Present (07/27/2023)  Housing: Low Risk  (07/27/2023)  Transportation Needs: Unmet Transportation Needs (07/27/2023)  Utilities: Not At Risk (07/27/2023)  Alcohol Screen: Low Risk  (06/24/2023)  Depression (PHQ2-9): Medium Risk (06/01/2023)  Financial Resource Strain: Low Risk  (06/24/2023)  Physical Activity: Insufficiently Active (06/24/2023)  Social Connections: Moderately Isolated (07/27/2023)  Stress: No Stress Concern Present (06/24/2023)  Recent Concern: Stress - Stress Concern Present (06/01/2023)  Tobacco Use: Medium Risk (07/27/2023)  Health Literacy: Medium Risk (12/01/2022)   Received from [facility]    Readmission Risk Interventions    07/28/2023   12:55 PM 04/22/2023    2:07 PM 03/23/2023   10:43 AM  Readmission Risk Prevention Plan  Transportation Screening Complete Complete Complete  Medication Review Oceanographer) Complete Complete Complete  PCP or Specialist appointment within 3-5 days of discharge Complete  Complete  HRI or Home Care Consult Complete Complete  Complete  SW Recovery Care/Counseling Consult Complete Complete Complete  Palliative Care Screening Not Applicable Not Applicable Not Applicable  Skilled Nursing Facility Not Applicable Complete Complete

## 2023-08-09 ENCOUNTER — Ambulatory Visit: Payer: Self-pay | Admitting: Licensed Clinical Social Worker

## 2023-08-09 LAB — TRIGLYCERIDES: Triglycerides: 331 mg/dL — ABNORMAL HIGH (ref <150)

## 2023-08-09 LAB — COMPREHENSIVE METABOLIC PANEL WITH GFR
ALT: 21 U/L (ref 0–44)
AST: 46 U/L — ABNORMAL HIGH (ref 15–41)
Albumin: 2.3 g/dL — ABNORMAL LOW (ref 3.5–5.0)
Alkaline Phosphatase: 101 U/L (ref 38–126)
Anion gap: 7 (ref 5–15)
BUN: 23 mg/dL (ref 8–23)
CO2: 27 mmol/L (ref 22–32)
Calcium: 8.8 mg/dL — ABNORMAL LOW (ref 8.9–10.3)
Chloride: 105 mmol/L (ref 98–111)
Creatinine, Ser: 0.81 mg/dL (ref 0.44–1.00)
GFR, Estimated: >60 mL/min (ref >60)
Glucose, Bld: 94 mg/dL (ref 70–99)
Potassium: 3.8 mmol/L (ref 3.5–5.1)
Sodium: 139 mmol/L (ref 135–145)
Total Bilirubin: 0.4 mg/dL (ref 0.0–1.2)
Total Protein: 5.3 g/dL — ABNORMAL LOW (ref 6.5–8.1)

## 2023-08-09 LAB — PHOSPHORUS: Phosphorus: 4.1 mg/dL (ref 2.5–4.6)

## 2023-08-09 LAB — MAGNESIUM: Magnesium: 2 mg/dL (ref 1.7–2.4)

## 2023-08-09 MED ORDER — TRAVASOL 10 % IV SOLN
INTRAVENOUS | Status: AC
Start: 2023-08-09 — End: 2023-08-10
  Filled 2023-08-09: qty 780

## 2023-08-09 NOTE — Patient Instructions (Signed)
 Visit Information  Thank you for taking time to visit with me today. Please don't hesitate to contact me if I can be of assistance to you before our next scheduled appointment.  Our next appointment is by telephone on 08/29/23 at 10:30 AM   Please call the care guide team at [phone] if you need to cancel or reschedule your appointment.   Following is a copy of your care plan:   Goals Addressed             This Visit's Progress    VBCI Social Work Care Plan       Problems:   Client is scheduled for medical procedure this Friday            Client family normally is in [place]. Client said her sister will be with her this Friday for client procedure             Fatigue; low energy            Some challenges with ADLs occasionally             May have some walking challenges              Client has occasional dizziness  CSW Clinical Goal(s):   Over the next  30 days the Patient will attend all scheduled medical appointments as evidenced by patient report and care team review of appointment completion in EMR:   .            Over the next 30 days, client will follow all medical guidelines by surgeon following her procedure this Friday for reversal of bariatric surgery AEB patient report.  Client said that her sister from [place] will be with client to help client as needed.  Interventions:  Spoke with client about client upcoming medical procedure this Friday             Discussed pain issues of client             Discussed sleeping issues of client; discussed appetite of client. Discussed             Medication procurement of client             Discussed family support for client. Sandie said that her sister will be with her              during the period of client surgery and afterwards to help client as needed             Client said she is scheduled this Friday for a bariatric reversal procedure              Discussed mood of client. Client did not mention any mood  issues  Patient Goals/Self-Care Activities:  Allow time to rest and eat meals as scheduled            Following medical procedure on Friday, client to comply with guidelines by surgeon for her recovery and healing process            Client to receive care as needed, following procedure , from client's sister            Client to attend medical appointments as scheduled            Client to take medications as prescribed Plan:   LCSW to call client on Aug 29, 2023 at 10:30 AM        Please go to [facility]  Behavioral Health Urgent Care [address] [place] [phone]) if you are experiencing a Mental Health or Behavioral Health Crisis or need someone to talk to.  The patient verbalized understanding of instructions, educational materials, and care plan provided today and DECLINED offer to receive copy of patient instructions, educational materials, and care plan.   Patient was given contact information for her to contact care team for support. LCSW gave client LCSW name and phone number and encouraged Manuela to call LCSW for SW support at [phone]  [name] Angel  MSW, LCSW [place]/Value Based [facility] Licensed Clinical Social Worker Direct Dial:  [phone] Fax:  [phone] Website:  [email or web address]

## 2023-08-09 NOTE — Plan of Care (Signed)
 ?  Problem: Clinical Measurements: ?Goal: Ability to maintain clinical measurements within normal limits will improve ?Outcome: Progressing ?Goal: Will remain free from infection ?Outcome: Progressing ?Goal: Diagnostic test results will improve ?Outcome: Progressing ?  ?

## 2023-08-09 NOTE — Progress Notes (Signed)
 Mobility Specialist - Progress Note   08/09/23 0922  Mobility  Activity Ambulated with assistance in hallway  Level of Assistance Modified independent, requires aide device or extra time  Assistive Device Front wheel walker  Distance Ambulated (ft) 275 ft  Activity Response Tolerated well  Mobility Referral Yes  Mobility visit 1 Mobility  Mobility Specialist Start Time (ACUTE ONLY) [id number]  Mobility Specialist Stop Time (ACUTE ONLY) 0920  Mobility Specialist Time Calculation (min) (ACUTE ONLY) 14 min   Pt received in bed and agreeable to mobility. No complaints during session. Pt to bedr after session with all needs met.    [facility]

## 2023-08-09 NOTE — Progress Notes (Signed)
 Progress Note: General Surgery Service   Chief Complaint/Subjective:  No acute events, no change.  Objective: Vital signs in last 24 hours: Temp:  [97.5 F (36.4 C)-98.7 F (37.1 C)] 98.7 F (37.1 C) (04/15 0642) Pulse Rate:  [84-97] 84 (04/15 0642) Resp:  [16-20] 16 (04/15 0642) BP: (132-155)/(66-83) 155/83 (04/15 0642) SpO2:  [100 %] 100 % (04/15 4098) Weight:  [87.1 kg] 87.1 kg (04/15 0500) Last BM Date : 08/08/23  Intake/Output from previous day: 04/14 0701 - 04/15 0700 In: 3296.2 [P.O.:1760; I.V.:1536.2] Out: 1400 [Urine:1400] Intake/Output this shift: No intake/output data recorded.  Gen: NAD Resp: breathing comfortably GI: soft, non distended, non tender.   Lab Results: CBC  Recent Labs    08/08/23 0302  WBC 4.6  HGB 7.1*  HCT 23.3*  PLT 215    BMET Recent Labs    08/08/23 0302 08/09/23 0427  NA 139 139  K 3.8 3.8  CL 105 105  CO2 26 27  GLUCOSE 93 94  BUN 23 23  CREATININE 0.85 0.81  CALCIUM 8.7* 8.8*   PT/INR No results for input(s): "LABPROT", "INR" in the last 72 hours. ABG No results for input(s): "PHART", "HCO3" in the last 72 hours.  Invalid input(s): "PCO2", "PO2"  Anti-infectives: Anti-infectives (From admission, onward)    None       Medications: Scheduled Meds:  acetaminophen  650 mg Oral Q6H   apixaban  5 mg Oral BID   Chlorhexidine Gluconate Cloth  6 each Topical Daily   copper  2 mg Oral Daily   docusate sodium  100 mg Oral BID   furosemide  20 mg Oral Daily   meclizine  12.5 mg Oral BID   multivitamin with minerals  1 tablet Oral Daily   nystatin   Topical TID   pantoprazole  40 mg Oral BID   Ensure Max Protein  11 oz Oral Daily   sucralfate  1 g Oral TID WC & HS   thiamine  500 mg Oral Daily   zinc sulfate (50mg  elemental zinc)  220 mg Oral Daily   Continuous Infusions:  TPN ADULT (ION) 65 mL/hr at 08/08/23 1801   TPN ADULT (ION)     PRN Meds:.alum & mag hydroxide-simeth, lip balm, ondansetron **OR**  ondansetron (ZOFRAN) IV, promethazine, simethicone, sodium chloride flush  Assessment/Plan: Christine [name] has had a terrible year after a gastric bypass and is now inpatient for TPN while awaiting possible reversal.   Failure to thrive Severe protein calorie malnutrition.  - bariatric reg, TPN - Eliquis for h/o DVT - stop eliquis on 4/15 for surgery on 4/18 - continue ambulation, pulm toilet - Follow up nutrition labs Anemia of chronic disease. - Plan for reversal of gastric bypass 08/12/23 at 730 this admission after interval of TPN   LOS: 13 days    Christine Olds, MD  [facility] Use [email or web address] to contact on call provider

## 2023-08-09 NOTE — Patient Outreach (Signed)
 Complex Care Management   Visit Note  08/09/2023  Name:  Christine [name] MRN: [id number] DOB: 02-Aug-1950  Situation: Referral received for Complex Care Management related to  management of anxiety and stress issues  I obtained verbal consent from Patient.  Visit completed with patient  on the phone  Background:   Past Medical History:  Diagnosis Date   Allergy    dust, pollen, sulfa, prednisone   Anemia    Anxiety    Arthritis    "knees" (04/26/2016)   Colon polyps    benign per pt   Coronary artery disease    mild per 2015 cath in [place]  (OM1 30%, RCA 30%)   GERD (gastroesophageal reflux disease)    Gout    History of hiatal hernia    Hyperlipidemia 05/20/2021   Hypertension    Migraine    "none since early /2017" (05/06/2016)   Obesity    OSA on CPAP    uses CPAP   Pre-diabetes    Pre-operative cardiovascular examination 08/22/2008   Renal insufficiency 11/09/2022   Vasculitis (HCC)    Bilateral    Assessment: Patient Reported Symptoms:  Cognitive    Occasional memory issues     Neurological    GAD: Anxiety  HEENT    No problems noted    Cardiovascular  HTN; DVT    Respiratory    Fatigues occasionally when walking  Endocrine Patient reports the following symptoms related to hypoglycemia or hyperglycemia : Weakness or fatigue Is patient diabetic?: No Endocrine Management Strategies: Adequate rest  Gastrointestinal Gastrointestinal Symptoms Reported: Incontinence Additional Gastrointestinal Details: GERD Gastrointestinal Conditions: Reflux/heartburn Gastrointestinal Management Strategies: Adequate rest    Genitourinary   Genitourinary Management Strategies: Adequate rest, Coping strategies  Integumentary Integumentary Symptoms Reported: Other (unable to assess) Skin Conditions:  (no problems noted) Skin Management Strategies: Adequate rest  Musculoskeletal Musculoskelatal Symptoms Reviewed: Unsteady gait, Weakness Musculoskeletal Conditions:  Osteoarthritis Musculoskeletal Management Strategies: Adequate rest      Psychosocial Psychosocial Symptoms Reported: Anxiety - if selected complete GAD  Normally resides alone at her home   Do you feel physically threatened by others?: No      08/09/2023    2:48 PM  Depression screen PHQ 2/9  Decreased Interest 1  Down, Depressed, Hopeless 1  PHQ - 2 Score 2  Altered sleeping 0  Tired, decreased energy 1  Change in appetite 0  Feeling bad or failure about yourself  0  Trouble concentrating 1  Moving slowly or fidgety/restless 1  Suicidal thoughts 0  PHQ-9 Score 5  Difficult doing work/chores Somewhat difficult    Vitals:  No issues noted by client  Medications Reviewed Today     Reviewed by Afton Horse (Social Worker) on 08/09/23 at 1448  Med List Status: <None>   Medication Order Taking? Sig Documenting Provider Last Dose Status Informant  apixaban (ELIQUIS) 5 MG TABS tablet [id number] No Place 1 tablet (5 mg total) into feeding tube 2 (two) times daily. Gonfa, Taye T, MD 07/27/2023 10:00 AM Active Self, Pharmacy Records           Med Note (CRUTHIS, CHLOE C   Wed Jul 27, 2023  1:18 PM) Pt is adamant she is still taking this medication. Dispense report does not support this claim.  colchicine 0.6 MG tablet [id number] No Take 1 tablet (0.6 mg total) by mouth daily.  Patient not taking: Reported on 07/27/2023   Jobe Mulder, DO Not Taking Active Self, Pharmacy Records  doxycycline (ADOXA) 100 MG tablet [id number] No Take 100 mg by mouth 2 (two) times daily.  Patient not taking: Reported on 07/27/2023   [provider] Not Taking Active Self, Pharmacy Records  furosemide (LASIX) 20 MG tablet [id number] No Take 1 tablet (20 mg total) by mouth daily. Jobe Mulder, DO 07/26/2023 Active Self, Pharmacy Records  lidocaine (XYLOCAINE) 2 % solution [id number] No Use as directed 5-10 mLs in the mouth or throat every 6 (six) hours as needed for mouth  pain.  Patient not taking: Reported on 07/27/2023   Jobe Mulder, DO Not Taking Active Self, Pharmacy Records  meclizine (ANTIVERT) 12.5 MG tablet [id number] No Take 1 tablet (12.5 mg total) by mouth 3 (three) times daily as needed for dizziness.  Patient taking differently: Take 12.5 mg by mouth 2 (two) times daily.   Jobe Mulder, DO 07/27/2023 Active Self, Pharmacy Records  Multiple Vitamin (MULTIVITAMIN WITH MINERALS) TABS tablet [id number] No Place 1 tablet into feeding tube daily.  Patient taking differently: Take 1 tablet by mouth in the morning and at bedtime.   Gonfa, Taye T, MD 07/27/2023 Active Self, Pharmacy Records  ondansetron (ZOFRAN) 4 MG tablet [id number] No Place 1 tablet (4 mg total) into feeding tube every 6 (six) hours as needed for nausea.  Patient not taking: Reported on 07/27/2023   Eveline Hipps, DO Not Taking Active Self, Pharmacy Records           Med Note (CRUTHIS, CHLOE C   Wed Jul 27, 2023  1:11 PM)    pantoprazole (PROTONIX) 40 MG tablet [id number] No Take 1 tablet (40 mg total) by mouth daily. Jobe Mulder, DO 07/27/2023 Expired 07/27/23 2359 Self, Pharmacy Records  polyethylene glycol powder (MIRALAX) 17 GM/SCOOP powder [id number] No Take 17 g by mouth 2 (two) times daily as needed for mild constipation.  Patient not taking: Reported on 07/27/2023   Gonfa, Taye T, MD Not Taking Active Self, Pharmacy Records            Recommendation:   Client to attend scheduled medical appointments in next 30 days Client to follow surgeon's guidelines following her procedure this Friday to help her with recovery process Client to cooperate with caregiver for client (her sister from [place] ) Client to take medications as prescribed Client to allow time for rest and relaxation.  Client to allow extra time for ADLs completion  Follow Up Plan:   LCSW to call client on Aug 29, 2023 at 10:30 AM   [name] Angel  MSW, LCSW [place]/Value Based  Care [facility] Licensed Clinical Social Worker Direct Dial:  [phone] Fax:  [phone] Website:  [email or web address]

## 2023-08-09 NOTE — Progress Notes (Signed)
 PHARMACY - TOTAL PARENTERAL NUTRITION CONSULT NOTE   Indication:  Intolerance to enteral feeding   Patient Measurements: Height: 5\' 1"  (154.9 cm) Weight: 87.1 kg (192 lb 0.3 oz) IBW/kg (Calculated) : 47.8 TPN AdjBW (KG): 56.4 Body mass index is 36.28 kg/m.  Assessment:  72yoF with hx gastric bypass in May 2024, weight at that time 131 kg. Pt has had rapid weight loss, inability to tolerate solid foods, complaints of severe weakness, and problems with gastrostomy tube displacement (Oct - mid-Nov) and ulcer repairs in Nov/Dec 2024. Now concern for stricture at marginal ulcer preventing intake of a regular post-operative diet, surgery team recommending reversal of bypass. 4/2 CT Abdomen c/w findings of prior gastric bypass without evidence of bowel obstruction  In total, ~50 kg loss since bypass. Pt took up to 100% of 2 meals on 4/4, 4/9 and 4/10. Consult for 2 weeks of TPN prior to bypass reversal to optimize nutritional status, OR planned for 4/18.   Glucose / Insulin: A1c 6.3 in May 2024; not on insulin  -  CBGs (goal <150): 94 from CMET Electrolytes: CorrCa up 10.16, phos down to 4.1 after phos removed in TPN bag on 4/14 - other lytes wnl Renal: Scr 0.81, BUN wnl Hepatic: AST slightly elevated at 46, Tbili wnl - albumin 2.3 - TG up 340 (on 4/14), 331 (on 4/15) Intake / Output; MIVF:  - No mIVF - Docusate BID, last BM 4/13 - UOP: 1400 mL  (on lasix 20mg  PO daily) - I/O: +1896 mL - Zinc 220 mg, copper 2 mg, thiamine 500 mg, PO multivitamin with minerals per RD - Bariatric diet:  eating 75-100% of meals + Ensure daily  GI Imaging: none since TPN initiation GI Surgeries / Procedures: none since TPN initiation  Central access: 07/27/23 TPN start date: 07/28/23   Nutritional Goals: Goal TPN rate is 65 mL/hr (provides 78 g of protein and 1,575 kcals per day)  RD Assessment: on 4/3 Estimated Needs Total Energy Estimated Needs: 1500-1800 Total Protein Estimated Needs: 75-90g Total  Fluid Estimated Needs: 1.8L/day  Current Nutrition:  TPN + full liquid bariatric diet, protein supplement (Ensure)  Plan:   At 1800:  - Continue TPN at 65 mL/hr at 1800 - Electrolytes in TPN:  Na 100 mEq/L K 45 mEq/L  Reduce Ca  to 2 mEq/L Mg 7 mEq/L Add Phos 10 mmol/L Cl:Ac 1:2  - Monitor TPN labs on Mon/Thurs & PRN - CMET and phos on 4/16  Sharlyn Deaner, PharmD, BCPS 08/09/2023 7:35 AM

## 2023-08-10 LAB — COMPREHENSIVE METABOLIC PANEL WITH GFR
ALT: 21 U/L (ref 0–44)
AST: 37 U/L (ref 15–41)
Albumin: 2.3 g/dL — ABNORMAL LOW (ref 3.5–5.0)
Alkaline Phosphatase: 94 U/L (ref 38–126)
Anion gap: 7 (ref 5–15)
BUN: 23 mg/dL (ref 8–23)
CO2: 26 mmol/L (ref 22–32)
Calcium: 8.7 mg/dL — ABNORMAL LOW (ref 8.9–10.3)
Chloride: 106 mmol/L (ref 98–111)
Creatinine, Ser: 0.81 mg/dL (ref 0.44–1.00)
GFR, Estimated: >60 mL/min (ref >60)
Glucose, Bld: 91 mg/dL (ref 70–99)
Potassium: 3.8 mmol/L (ref 3.5–5.1)
Sodium: 139 mmol/L (ref 135–145)
Total Bilirubin: 0.4 mg/dL (ref 0.0–1.2)
Total Protein: 5.4 g/dL — ABNORMAL LOW (ref 6.5–8.1)

## 2023-08-10 LAB — PHOSPHORUS: Phosphorus: 4 mg/dL (ref 2.5–4.6)

## 2023-08-10 MED ORDER — TRAVASOL 10 % IV SOLN
INTRAVENOUS | Status: AC
  Filled 2023-08-10: qty 780

## 2023-08-10 NOTE — Plan of Care (Signed)

## 2023-08-10 NOTE — Progress Notes (Signed)
 PHARMACY - TOTAL PARENTERAL NUTRITION CONSULT NOTE   Indication:  Intolerance to enteral feeding   Patient Measurements: Height: 5\' 1"  (154.9 cm) Weight: 89.4 kg (197 lb 1.5 oz) IBW/kg (Calculated) : 47.8 TPN AdjBW (KG): 56.4 Body mass index is 37.24 kg/m.  Assessment:  72yoF with hx gastric bypass in May 2024, weight at that time 131 kg. Pt has had rapid weight loss, inability to tolerate solid foods, complaints of severe weakness, and problems with gastrostomy tube displacement (Oct - mid-Nov) and ulcer repairs in Nov/Dec 2024. Now concern for stricture at marginal ulcer preventing intake of a regular post-operative diet, surgery team recommending reversal of bypass. 4/2 CT Abdomen c/w findings of prior gastric bypass without evidence of bowel obstruction  In total, ~50 kg loss since bypass. Pt took up to 100% of 2 meals on 4/4, 4/9 and 4/10. Consult for 2 weeks of TPN prior to bypass reversal to optimize nutritional status, OR planned for 4/18.   Glucose / Insulin: A1c 6.3 in May 2024; not on insulin  -  CBGs (goal <150): 91 from CMET Electrolytes: CorrCa 10.06, phos now wnl at 4 (after phos removed in TPN bag on 4/14 and resumed back at a lower conc on 4/15)) - other lytes wnl Renal: Scr 0.81, BUN wnl Hepatic: LFTs and Tbili wnl - albumin 2.3 - TG up 340 (on 4/14), 331 (on 4/15) Intake / Output; MIVF:  - No mIVF - Docusate BID, last BM 4/13 - UOP: 800 mL  (on lasix 20mg  PO daily) - I/O: +2207 mL - Zinc 220 mg, copper 2 mg, thiamine 500 mg, PO multivitamin with minerals per RD - Bariatric diet:  eating 75-100% of meals + Ensure daily  GI Imaging: none since TPN initiation GI Surgeries / Procedures: none since TPN initiation  Central access: 07/27/23 TPN start date: 07/28/23   Nutritional Goals: Goal TPN rate is 65 mL/hr (provides 78 g of protein and 1,575 kcals per day)  RD Assessment: on 4/3 Estimated Needs Total Energy Estimated Needs: 1500-1800 Total Protein Estimated  Needs: 75-90g Total Fluid Estimated Needs: 1.8L/day  Current Nutrition:  TPN + full liquid bariatric diet, protein supplement (Ensure)  Plan:   At 1800:  - Continue TPN at 65 mL/hr at 1800 - Electrolytes in TPN:  Na 100 mEq/L K 45 mEq/L  Ca 2 mEq/L Mg 7 mEq/L Phos 10 mmol/L Cl:Ac 1:2  - Monitor TPN labs on Mon/Thurs & PRN   Sharlyn Deaner, PharmD, BCPS 08/10/2023 7:20 AM

## 2023-08-10 NOTE — Progress Notes (Signed)
 Progress Note: General Surgery Service   Chief Complaint/Subjective:  No acute events, no change.  Objective: Vital signs in last 24 hours: Temp:  [97.7 F (36.5 C)-98.6 F (37 C)] 98.6 F (37 C) (04/16 2036) Pulse Rate:  [90-96] 91 (04/16 2036) Resp:  [16-18] 16 (04/16 2036) BP: (128-146)/(69-89) 128/69 (04/16 2036) SpO2:  [99 %-100 %] 100 % (04/16 2036) Weight:  [89.4 kg] 89.4 kg (04/16 0500) Last BM Date : 08/09/23  Intake/Output from previous day: 04/15 0701 - 04/16 0700 In: 3007 [P.O.:1440; I.V.:1567] Out: 800 [Urine:800] Intake/Output this shift: Total I/O In: 194.9 [I.V.:194.9] Out: -   Gen: NAD Resp: breathing comfortably GI: soft, non distended, non tender.   Lab Results: CBC  Recent Labs    08/08/23 0302  WBC 4.6  HGB 7.1*  HCT 23.3*  PLT 215    BMET Recent Labs    08/09/23 0427 08/10/23 0404  NA 139 139  K 3.8 3.8  CL 105 106  CO2 27 26  GLUCOSE 94 91  BUN 23 23  CREATININE 0.81 0.81  CALCIUM 8.8* 8.7*   PT/INR No results for input(s): "LABPROT", "INR" in the last 72 hours. ABG No results for input(s): "PHART", "HCO3" in the last 72 hours.  Invalid input(s): "PCO2", "PO2"  Anti-infectives: Anti-infectives (From admission, onward)    None       Medications: Scheduled Meds:  acetaminophen  650 mg Oral Q6H   Chlorhexidine Gluconate Cloth  6 each Topical Daily   copper  2 mg Oral Daily   docusate sodium  100 mg Oral BID   furosemide  20 mg Oral Daily   meclizine  12.5 mg Oral BID   multivitamin with minerals  1 tablet Oral Daily   nystatin   Topical TID   pantoprazole  40 mg Oral BID   Ensure Max Protein  11 oz Oral Daily   sucralfate  1 g Oral TID WC & HS   thiamine  500 mg Oral Daily   zinc sulfate (50mg  elemental zinc)  220 mg Oral Daily   Continuous Infusions:  TPN ADULT (ION) 65 mL/hr at 08/10/23 1730   PRN Meds:.alum & mag hydroxide-simeth, lip balm, ondansetron **OR** ondansetron (ZOFRAN) IV, promethazine,  simethicone, sodium chloride flush  Assessment/Plan: Ms. Eber has had a terrible year after a gastric bypass and is now inpatient for TPN while awaiting possible reversal.   Failure to thrive Severe protein calorie malnutrition.  - bariatric reg, TPN - Eliquis for h/o DVT - stop eliquis on 4/15 for surgery on 4/18 - continue ambulation, pulm toilet - Follow up nutrition labs Anemia of chronic disease. - Plan for reversal of gastric bypass 08/12/23 at 730 this admission after interval of TPN   Late entry note - patient was seen around lunch time  LOS: 14 days    [name], MD  [facility] Use [email or web address] to contact on call provider

## 2023-08-10 NOTE — Anesthesia Preprocedure Evaluation (Addendum)
 Anesthesia Evaluation  Patient identified by MRN, date of birth, ID band Patient awake    Reviewed: Allergy & Precautions, NPO status , Patient's Chart, lab work & pertinent test results  History of Anesthesia Com

## 2023-08-10 NOTE — Progress Notes (Signed)
 Mobility Specialist - Progress Note   08/10/23 0914  Mobility  Activity Ambulated with assistance in hallway  Level of Assistance Modified independent, requires aide device or extra time  Assistive Device Four wheel walker  Distance Ambulated (ft) 460 ft  Activity Response Tolerated well  Mobility Referral Yes  Mobility visit 1 Mobility  Mobility Specialist Start Time (ACUTE ONLY) 0850  Mobility Specialist Stop Time (ACUTE ONLY) 0914  Mobility Specialist Time Calculation (min) (ACUTE ONLY) 24 min   Pt received in bed and agreeable to mobility. No complaints during session. Upon return, pt requested assistance to [facility]. Pt to bathroom after session with all needs met.    [facility]

## 2023-08-11 LAB — COMPREHENSIVE METABOLIC PANEL WITH GFR
ALT: 20 U/L (ref 0–44)
AST: 32 U/L (ref 15–41)
Albumin: 2.5 g/dL — ABNORMAL LOW (ref 3.5–5.0)
Alkaline Phosphatase: 98 U/L (ref 38–126)
Anion gap: 6 (ref 5–15)
BUN: 25 mg/dL — ABNORMAL HIGH (ref 8–23)
CO2: 25 mmol/L (ref 22–32)
Calcium: 8.6 mg/dL — ABNORMAL LOW (ref 8.9–10.3)
Chloride: 105 mmol/L (ref 98–111)
Creatinine, Ser: 0.73 mg/dL (ref 0.44–1.00)
GFR, Estimated: >60 mL/min (ref >60)
Glucose, Bld: 86 mg/dL (ref 70–99)
Potassium: 3.9 mmol/L (ref 3.5–5.1)
Sodium: 136 mmol/L (ref 135–145)
Total Bilirubin: 0.6 mg/dL (ref 0.0–1.2)
Total Protein: 5.5 g/dL — ABNORMAL LOW (ref 6.5–8.1)

## 2023-08-11 LAB — PHOSPHORUS: Phosphorus: 4.6 mg/dL (ref 2.5–4.6)

## 2023-08-11 LAB — MAGNESIUM: Magnesium: 1.9 mg/dL (ref 1.7–2.4)

## 2023-08-11 MED ORDER — SODIUM CHLORIDE 0.9 % IV SOLN
1.0000 g | Freq: Once | INTRAVENOUS | Status: AC
  Administered 2023-08-12: 1 g via INTRAVENOUS
  Filled 2023-08-11: qty 1

## 2023-08-11 MED ORDER — TRAVASOL 10 % IV SOLN
INTRAVENOUS | Status: AC
  Filled 2023-08-11: qty 780

## 2023-08-11 NOTE — TOC Progression Note (Signed)
 Transition of Care [facility]) - Progression Note    Patient Details  Name: Christine [name] MRN: [id number] Date of Birth: 05-20-1950  [facility] CM/SW Contact  Bari Leys, RN Phone Number: 08/11/2023, 1:57 PM  Clinical Narrative:  Per surgery progress note 4/18 -Plan for reversal of gastric bypass and exploration of epigastric chronic wound 08/12/23 at 730 this admission after interval of TPN . TOC will continue to follow.      Expected Discharge Plan: Home w Home Health Services Barriers to Discharge: Continued Medical Work up  Expected Discharge Plan and Services       Living arrangements for the past 2 months: Single Family Home                           HH Arranged: PT [facility] Agency: [facility] Date HH Agency Contacted: 07/28/23 Time HH Agency Contacted: [id number] Representative spoke with at [facility] Agency: Imelda Man   Social Determinants of Health (SDOH) Interventions SDOH Screenings   Food Insecurity: Food Insecurity Present (08/09/2023)  Housing: Low Risk  (08/09/2023)  Transportation Needs: Unmet Transportation Needs (08/09/2023)  Utilities: Not At Risk (08/09/2023)  Alcohol Screen: Low Risk  (06/24/2023)  Depression (PHQ2-9): Medium Risk (08/09/2023)  Financial Resource Strain: Low Risk  (06/24/2023)  Physical Activity: Insufficiently Active (06/24/2023)  Social Connections: Moderately Isolated (07/27/2023)  Stress: Stress Concern Present (08/09/2023)  Tobacco Use: Medium Risk (07/27/2023)  Health Literacy: Medium Risk (12/01/2022)   Received from [facility]    Readmission Risk Interventions    07/28/2023   12:55 PM 04/22/2023    2:07 PM 03/23/2023   10:43 AM  Readmission Risk Prevention Plan  Transportation Screening Complete Complete Complete  Medication Review Oceanographer) Complete Complete Complete  PCP or Specialist appointment within 3-5 days of discharge Complete  Complete  HRI or Home Care Consult Complete Complete Complete  SW  Recovery Care/Counseling Consult Complete Complete Complete  Palliative Care Screening Not Applicable Not Applicable Not Applicable  Skilled Nursing Facility Not Applicable Complete Complete

## 2023-08-11 NOTE — Plan of Care (Signed)
   Problem: Education: Goal: Knowledge of General Education information will improve Description Including pain rating scale, medication(s)/side effects and non-pharmacologic comfort measures Outcome: Progressing

## 2023-08-11 NOTE — Progress Notes (Signed)
 PT Cancellation Note  Patient Details Name: Omer Monter MRN: [id number] DOB: 04-24-1951   Cancelled Treatment:     attempted to see in am.  Pt in bed curled in fetal position with room dark c/o nausea.  Will attempt to see later or another day as schedule permits.   Bess Broody  PTA Acute  Rehabilitation Services Office M-F          [phone]

## 2023-08-11 NOTE — Progress Notes (Signed)
 Progress Note: General Surgery Service   Chief Complaint/Subjective:  No acute events, no change.  Objective: Vital signs in last 24 hours: Temp:  [97.5 F (36.4 C)-98.6 F (37 C)] 97.5 F (36.4 C) (04/17 1117) Pulse Rate:  [88-101] 101 (04/17 1117) Resp:  [15-16] 15 (04/17 1117) BP: (119-146)/(60-89) 119/60 (04/17 1117) SpO2:  [100 %] 100 % (04/17 1117) Last BM Date : 08/09/23  Intake/Output from previous day: 04/16 0701 - 04/17 0700 In: 2189.7 [P.O.:1260; I.V.:929.7] Out: 1300 [Urine:1300] Intake/Output this shift: Total I/O In: 1362 [P.O.:520; I.V.:842] Out: 400 [Urine:400]  Gen: NAD Resp: breathing comfortably GI: soft, non distended, non tender. Superficial epidermal ulceration along previous upper midline incision  Lab Results: CBC  No results for input(s): "WBC", "HGB", "HCT", "PLT" in the last 72 hours.   BMET Recent Labs    08/10/23 0404 08/11/23 0309  NA 139 136  K 3.8 3.9  CL 106 105  CO2 26 25  GLUCOSE 91 86  BUN 23 25*  CREATININE 0.81 0.73  CALCIUM 8.7* 8.6*   PT/INR No results for input(s): "LABPROT", "INR" in the last 72 hours. ABG No results for input(s): "PHART", "HCO3" in the last 72 hours.  Invalid input(s): "PCO2", "PO2"  Anti-infectives: Anti-infectives (From admission, onward)    None       Medications: Scheduled Meds:  acetaminophen  650 mg Oral Q6H   Chlorhexidine Gluconate Cloth  6 each Topical Daily   copper  2 mg Oral Daily   docusate sodium  100 mg Oral BID   furosemide  20 mg Oral Daily   meclizine  12.5 mg Oral BID   multivitamin with minerals  1 tablet Oral Daily   nystatin   Topical TID   pantoprazole  40 mg Oral BID   Ensure Max Protein  11 oz Oral Daily   sucralfate  1 g Oral TID WC & HS   thiamine  500 mg Oral Daily   zinc sulfate (50mg  elemental zinc)  220 mg Oral Daily   Continuous Infusions:  TPN ADULT (ION) 65 mL/hr at 08/10/23 1730   TPN ADULT (ION)     PRN Meds:.alum & mag hydroxide-simeth,  lip balm, ondansetron **OR** ondansetron (ZOFRAN) IV, promethazine, simethicone, sodium chloride flush  Assessment/Plan: Ms. Batie has had a terrible year after a gastric bypass and is now inpatient for TPN while awaiting possible reversal.   Failure to thrive Severe protein calorie malnutrition.  - bariatric reg, TPN - Eliquis for h/o DVT - stopped eliquis on 4/15 for surgery on 4/18 - continue ambulation, pulm toilet - Follow up nutrition labs - Anemia of chronic disease. - Plan for reversal of gastric bypass and exploration of epigastric chronic wound 08/12/23 at 730 this admission after interval of TPN   Late entry note - patient was seen around lunch time  LOS: 15 days    [name], MD  [facility] Use [email or web address] to contact on call provider

## 2023-08-11 NOTE — Progress Notes (Signed)
 PHARMACY - TOTAL PARENTERAL NUTRITION CONSULT NOTE   Indication:  Intolerance to enteral feeding   Patient Measurements: Height: 5\' 1"  (154.9 cm) Weight: 89.4 kg (197 lb 1.5 oz) IBW/kg (Calculated) : 47.8 TPN AdjBW (KG): 56.4 Body mass index is 37.24 kg/m.  Assessment:  72yoF with hx gastric bypass in May 2024, weight at that time 131 kg. Pt has had rapid weight loss, inability to tolerate solid foods, complaints of severe weakness, and problems with gastrostomy tube displacement (Oct - mid-Nov) and ulcer repairs in Nov/Dec 2024. 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 Surgery team recommending reversal of bypass.   Consult for 2 weeks of TPN prior to bypass reversal to optimize nutritional status, OR planned for 4/18.   Glucose / Insulin: A1c 6.3 in May 2024; not on insulin  -  Not on SSI/CBGs Electrolytes: CoCa 9.8, Phos 4.6 (watch),  Renal: Scr <1,  Hepatic: LFTs and Tbili wnl - albumin 2.3 - TG elevated:  340 (on 4/14), 331 (on 4/15) Intake / Output; MIVF:  - No mIVF - PO intake: 75-100% meals - Docusate BID, last BM 4/15 - UOP: 1300 mL  (on lasix 20mg  PO daily) - I/O: +889 mL  GI Meds: Protonix BID, Sucralfate QID  GI Imaging:  - 4/2 CT Abdomen c/w findings of prior gastric bypass without evidence of bowel obstruction  GI Surgeries / Procedures: none since TPN initiation  Central access: 07/27/23 TPN start date: 07/28/23   Nutritional Goals: Goal TPN rate is 65 mL/hr (provides 78 g of protein and 1,575 kcals per day)  RD Assessment: on 4/3 Estimated Needs Total Energy Estimated Needs: 1500-1800 Total Protein Estimated Needs: 75-90g Total Fluid Estimated Needs: 1.8L/day  Current Nutrition:  TPN + full liquid bariatric diet, protein supplement (Ensure) Supplements:  Copper tablet 2mg  daily, oral MV, thiamine 500mg /d, Zinc 220mg /d  Plan:   At 1800:  - Continue TPN at 65 mL/hr at 1800 - Electrolytes in TPN:   Na 100 mEq/L K 45 mEq/L  Ca 2 mEq/L Mg 7 mEq/L Phos 10 mmol/L Cl:Ac 1:2   - Suppl Copper tablet 2mg  daily, oral MV, thiamine 500mg /d, Zinc 220mg /d - Monitor TPN labs on Mon/Thurs & PRN  Kendall Arnell S. Zannie Hey, PharmD, BCPS Clinical Staff Pharmacist 08/11/2023 9:37 AM

## 2023-08-11 NOTE — Plan of Care (Signed)
   Problem: Education: Goal: Knowledge of General Education information will improve Description Including pain rating scale, medication(s)/side effects and non-pharmacologic comfort measures Outcome: Progressing   Problem: Health Behavior/Discharge Planning: Goal: Ability to manage health-related needs will improve Outcome: Progressing

## 2023-08-11 NOTE — Progress Notes (Signed)
 Nutrition Follow-up  DOCUMENTATION CODES:   Severe malnutrition in context of chronic illness  INTERVENTION:  - Continue TPN, meeting 100% of needs.   - TPN management per Pharmacy -Daily weights while on TPN   - Bariatric Advanced diet per [facility]. -Ensure MAX Protein po daily, each supplement provides 150 kcal and 30 grams of protein   -Multivitamin with minerals daily. -MD ordered vitamin labs.  - Patient found to have low Thiamine, Copper and zinc labs. MD has ordered supplementation.   NUTRITION DIAGNOSIS:   Severe Malnutrition related to chronic illness, altered GI function as evidenced by energy intake < or equal to 75% for > or equal to 1 month, percent weight loss. *ongoing  GOAL:   Patient will meet greater than or equal to 90% of their needs *met with TPN  MONITOR:   PO intake, Supplement acceptance, Labs (TPN)  REASON FOR ASSESSMENT:   Consult New TPN/TNA  ASSESSMENT:   73 y.o. female admitted with FTT, abdominal pain, inablilty to tolerate solids. Plan is for 2 weeks of TPN in prepartion for gastric bypass reversal. 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  4/2: admitted 4/3: TPN initiated 4/7: diet advanced to bariatric advanced, low carb, high protein diet   Patient continue to consume 50-100% of meals. Drinking Ensure Max. Per surgery note, plan is for gastric bypass reversal tomorrow. TPN to continue until surgery.  Patient remains on vitamin/mineral supplementation for deficiencies.     TPN continues at 65 ml/hr, providing 1575 kcals and 78g protein.    Admit weight: 181# Current weight: 197# I&O's: +22L since 4/3  Medications reviewed and include: 2mg  copper, MVI, 500mg  thiamine, 220mg  zinc, Colace,   Labs reviewed:  -Vit D WNL -Vit A WNL -Vit E WNL -Vit K WNL -Thiamine (33) low - Vitamin B12 WNL -Zinc (29) low -Copper (67) low   Diet Order:   Diet Order             Diet bariatric advanced  Room service appropriate? Yes; Fluid consistency: Thin  Diet effective now                   EDUCATION NEEDS:  Education needs have been addressed  Skin:  Skin Assessment: Skin Integrity Issues: Skin Integrity Issues:: Stage II Stage II: sacrum  Last BM:  4/15  Height:  Ht Readings from Last 1 Encounters:  07/27/23 5\' 1"  (1.549 m)   Weight:  Wt Readings from Last 1 Encounters:  08/10/23 89.4 kg   BMI:  Body mass index is 37.24 kg/m.  Estimated Nutritional Needs:  Kcal:  1500-1800 Protein:  75-90g Fluid:  1.8L/day    Scheryl Cushing RD, LDN Contact via Secure Chat.

## 2023-08-12 ENCOUNTER — Encounter (HOSPITAL_COMMUNITY): Payer: Self-pay | Admitting: Surgery

## 2023-08-12 ENCOUNTER — Inpatient Hospital Stay (HOSPITAL_COMMUNITY): Admitting: Anesthesiology

## 2023-08-12 ENCOUNTER — Other Ambulatory Visit: Payer: Self-pay

## 2023-08-12 ENCOUNTER — Encounter (HOSPITAL_COMMUNITY)
Admission: AD | Disposition: A | Discharge status: Home Health | Payer: Self-pay | Source: Ambulatory Visit | Attending: Surgery

## 2023-08-12 DIAGNOSIS — Z87891 Personal history of nicotine dependence: Secondary | ICD-10-CM

## 2023-08-12 DIAGNOSIS — I2510 Atherosclerotic heart disease of native coronary artery without angina pectoris: Secondary | ICD-10-CM | POA: Diagnosis not present

## 2023-08-12 DIAGNOSIS — R627 Adult failure to thrive: Secondary | ICD-10-CM | POA: Diagnosis not present

## 2023-08-12 DIAGNOSIS — Z9884 Bariatric surgery status: Secondary | ICD-10-CM | POA: Diagnosis not present

## 2023-08-12 HISTORY — PX: GASTROPEXY, ROBOT-ASSISTED, LAPAROSCOPIC: SHX7580

## 2023-08-12 LAB — CBC
HCT: 24.7 % — ABNORMAL LOW (ref 36.0–46.0)
Hemoglobin: 7.5 g/dL — ABNORMAL LOW (ref 12.0–15.0)
MCH: 30.7 pg (ref 26.0–34.0)
MCHC: 30.4 g/dL (ref 30.0–36.0)
MCV: 101.2 fL — ABNORMAL HIGH (ref 80.0–100.0)
Platelets: 259 10*3/uL (ref 150–400)
RBC: 2.44 MIL/uL — ABNORMAL LOW (ref 3.87–5.11)
RDW: 18.3 % — ABNORMAL HIGH (ref 11.5–15.5)
WBC: 5.4 10*3/uL (ref 4.0–10.5)
nRBC: 0 % (ref 0.0–0.2)

## 2023-08-12 LAB — SURGICAL PCR SCREEN
MRSA, PCR: NEGATIVE
Staphylococcus aureus: NEGATIVE

## 2023-08-12 SURGERY — GASTROPEXY, ROBOT-ASSISTED, LAPAROSCOPIC
Anesthesia: General

## 2023-08-12 MED ORDER — PHENYLEPHRINE HCL-NACL 20-0.9 MG/250ML-% IV SOLN
INTRAVENOUS | Status: DC | PRN
  Administered 2023-08-12: 30 ug/min via INTRAVENOUS

## 2023-08-12 MED ORDER — PHENYLEPHRINE HCL-NACL 20-0.9 MG/250ML-% IV SOLN
INTRAVENOUS | Status: AC
  Filled 2023-08-12: qty 250

## 2023-08-12 MED ORDER — CHLORHEXIDINE GLUCONATE 0.12 % MT SOLN
15.0000 mL | Freq: Once | OROMUCOSAL | Status: AC
  Administered 2023-08-12: 15 mL via OROMUCOSAL

## 2023-08-12 MED ORDER — SUGAMMADEX SODIUM 200 MG/2ML IV SOLN
INTRAVENOUS | Status: DC | PRN
  Administered 2023-08-12: 200 mg via INTRAVENOUS

## 2023-08-12 MED ORDER — ROCURONIUM BROMIDE 10 MG/ML (PF) SYRINGE
PREFILLED_SYRINGE | INTRAVENOUS | Status: AC
  Filled 2023-08-12: qty 10

## 2023-08-12 MED ORDER — PROPOFOL 10 MG/ML IV BOLUS
INTRAVENOUS | Status: AC
  Filled 2023-08-12: qty 20

## 2023-08-12 MED ORDER — ACETAMINOPHEN 10 MG/ML IV SOLN
INTRAVENOUS | Status: DC | PRN
  Administered 2023-08-12: 1000 mg via INTRAVENOUS

## 2023-08-12 MED ORDER — TRAVASOL 10 % IV SOLN
INTRAVENOUS | Status: AC
  Filled 2023-08-12: qty 780

## 2023-08-12 MED ORDER — PROCHLORPERAZINE EDISYLATE 10 MG/2ML IJ SOLN
10.0000 mg | INTRAMUSCULAR | Status: DC | PRN
Start: 2023-08-12 — End: 2023-08-23

## 2023-08-12 MED ORDER — LIDOCAINE HCL (PF) 2 % IJ SOLN
INTRAMUSCULAR | Status: AC
  Filled 2023-08-12: qty 5

## 2023-08-12 MED ORDER — METOCLOPRAMIDE HCL 5 MG/ML IJ SOLN
INTRAMUSCULAR | Status: DC | PRN
  Administered 2023-08-12: 5 mg via INTRAVENOUS

## 2023-08-12 MED ORDER — ONDANSETRON HCL 4 MG/2ML IJ SOLN
INTRAMUSCULAR | Status: AC
  Filled 2023-08-12: qty 2

## 2023-08-12 MED ORDER — DEXMEDETOMIDINE HCL IN NACL 80 MCG/20ML IV SOLN
INTRAVENOUS | Status: DC | PRN
  Administered 2023-08-12 (×2): 8 ug via INTRAVENOUS

## 2023-08-12 MED ORDER — DEXAMETHASONE SODIUM PHOSPHATE 10 MG/ML IJ SOLN
INTRAMUSCULAR | Status: AC
  Filled 2023-08-12: qty 1

## 2023-08-12 MED ORDER — LABETALOL HCL 5 MG/ML IV SOLN
INTRAVENOUS | Status: DC | PRN
  Administered 2023-08-12 (×2): 5 mg via INTRAVENOUS

## 2023-08-12 MED ORDER — MIDAZOLAM HCL 2 MG/2ML IJ SOLN
INTRAMUSCULAR | Status: AC
Start: 2023-08-12 — End: ?
  Filled 2023-08-12: qty 2

## 2023-08-12 MED ORDER — LABETALOL HCL 5 MG/ML IV SOLN
INTRAVENOUS | Status: AC
  Filled 2023-08-12: qty 4

## 2023-08-12 MED ORDER — FENTANYL CITRATE PF 50 MCG/ML IJ SOSY
25.0000 ug | PREFILLED_SYRINGE | INTRAMUSCULAR | Status: DC | PRN
Start: 2023-08-12 — End: 2023-08-12

## 2023-08-12 MED ORDER — METOCLOPRAMIDE HCL 5 MG/ML IJ SOLN
INTRAMUSCULAR | Status: AC
  Filled 2023-08-12: qty 2

## 2023-08-12 MED ORDER — HYDROMORPHONE HCL 1 MG/ML IJ SOLN
1.0000 mg | INTRAMUSCULAR | Status: DC | PRN
  Administered 2023-08-12: 1 mg via INTRAVENOUS
  Filled 2023-08-12: qty 1

## 2023-08-12 MED ORDER — ACETAMINOPHEN 10 MG/ML IV SOLN
INTRAVENOUS | Status: AC
  Filled 2023-08-12: qty 100

## 2023-08-12 MED ORDER — PHENYLEPHRINE 80 MCG/ML (10ML) SYRINGE FOR IV PUSH (FOR BLOOD PRESSURE SUPPORT)
PREFILLED_SYRINGE | INTRAVENOUS | Status: DC | PRN
Start: 2023-08-12 — End: 2023-08-12
  Administered 2023-08-12: 80 ug via INTRAVENOUS
  Administered 2023-08-12 (×2): 160 ug via INTRAVENOUS

## 2023-08-12 MED ORDER — LACTATED RINGERS IV SOLN: INTRAVENOUS | Status: DC | PRN

## 2023-08-12 MED ORDER — DEXAMETHASONE SODIUM PHOSPHATE 10 MG/ML IJ SOLN
INTRAMUSCULAR | Status: DC | PRN
  Administered 2023-08-12: 5 mg via INTRAVENOUS

## 2023-08-12 MED ORDER — METHOCARBAMOL 1000 MG/10ML IJ SOLN: 500.0000 mg | Freq: Four times a day (QID) | INTRAMUSCULAR | Status: DC | PRN

## 2023-08-12 MED ORDER — PROPOFOL 10 MG/ML IV BOLUS
INTRAVENOUS | Status: DC | PRN
  Administered 2023-08-12: 140 mg via INTRAVENOUS

## 2023-08-12 MED ORDER — MIDAZOLAM HCL 5 MG/5ML IJ SOLN
INTRAMUSCULAR | Status: DC | PRN
  Administered 2023-08-12: 2 mg via INTRAVENOUS

## 2023-08-12 MED ORDER — BUPIVACAINE-EPINEPHRINE 0.25% -1:200000 IJ SOLN
INTRAMUSCULAR | Status: DC | PRN
  Administered 2023-08-12: 60 mL

## 2023-08-12 MED ORDER — FENTANYL CITRATE (PF) 100 MCG/2ML IJ SOLN
INTRAMUSCULAR | Status: AC
  Filled 2023-08-12: qty 2

## 2023-08-12 MED ORDER — ROCURONIUM BROMIDE 100 MG/10ML IV SOLN
INTRAVENOUS | Status: DC | PRN
  Administered 2023-08-12: 10 mg via INTRAVENOUS
  Administered 2023-08-12: 60 mg via INTRAVENOUS

## 2023-08-12 MED ORDER — DIPHENHYDRAMINE HCL 50 MG/ML IJ SOLN
INTRAMUSCULAR | Status: DC | PRN
  Administered 2023-08-12: 12.5 mg via INTRAVENOUS

## 2023-08-12 MED ORDER — STERILE WATER FOR IRRIGATION IR SOLN
Status: DC | PRN
  Administered 2023-08-12: 1000 mL

## 2023-08-12 MED ORDER — LACTATED RINGERS IV SOLN: INTRAVENOUS | Status: DC

## 2023-08-12 MED ORDER — CYANOCOBALAMIN 1000 MCG/ML IJ SOLN: 1000.0000 ug | Freq: Once | INTRAMUSCULAR | Status: DC

## 2023-08-12 MED ORDER — DIPHENHYDRAMINE HCL 50 MG/ML IJ SOLN
INTRAMUSCULAR | Status: AC
  Filled 2023-08-12: qty 1

## 2023-08-12 MED ORDER — DROPERIDOL 2.5 MG/ML IJ SOLN: 0.6250 mg | Freq: Once | INTRAMUSCULAR | Status: DC | PRN

## 2023-08-12 MED ORDER — BUPIVACAINE-EPINEPHRINE (PF) 0.25% -1:200000 IJ SOLN
INTRAMUSCULAR | Status: AC
  Filled 2023-08-12: qty 60

## 2023-08-12 MED ORDER — OXYCODONE HCL 5 MG PO TABS
10.0000 mg | ORAL_TABLET | ORAL | Status: DC | PRN
  Administered 2023-08-15 – 2023-08-17 (×3): 10 mg via ORAL
  Filled 2023-08-12 (×4): qty 2

## 2023-08-12 MED ORDER — OXYCODONE HCL 5 MG PO TABS
5.0000 mg | ORAL_TABLET | ORAL | Status: DC | PRN
  Administered 2023-08-13 (×2): 5 mg via ORAL
  Filled 2023-08-12 (×2): qty 1

## 2023-08-12 MED ORDER — FENTANYL CITRATE (PF) 100 MCG/2ML IJ SOLN
INTRAMUSCULAR | Status: DC | PRN
  Administered 2023-08-12 (×2): 25 ug via INTRAVENOUS
  Administered 2023-08-12: 100 ug via INTRAVENOUS
  Administered 2023-08-12: 50 ug via INTRAVENOUS

## 2023-08-12 MED ORDER — LIDOCAINE HCL (CARDIAC) PF 100 MG/5ML IV SOSY
PREFILLED_SYRINGE | INTRAVENOUS | Status: DC | PRN
  Administered 2023-08-12: 100 mg via INTRAVENOUS

## 2023-08-12 MED ORDER — DEXMEDETOMIDINE HCL IN NACL 80 MCG/20ML IV SOLN
INTRAVENOUS | Status: AC
Start: 2023-08-12 — End: ?
  Filled 2023-08-12: qty 20

## 2023-08-12 SURGICAL SUPPLY — 66 items
ANTIFOG SOL W/FOAM PAD STRL (MISCELLANEOUS) ×1 IMPLANT
BLADE SURG SZ11 CARB STEEL (BLADE) ×1 IMPLANT
CHLORAPREP W/TINT 26 (MISCELLANEOUS) ×2 IMPLANT
COVER SURGICAL LIGHT HANDLE (MISCELLANEOUS) ×1 IMPLANT
COVER TIP SHEARS 8 DVNC (MISCELLANEOUS) ×1 IMPLANT
DERMABOND ADVANCED .7 DNX12 (GAUZE/BANDAGES/DRESSINGS) ×1 IMPLANT
DRAPE ARM DVNC X/XI (DISPOSABLE) ×4 IMPLANT
DRAPE COLUMN DVNC XI (DISPOSABLE) ×1 IMPLANT
DRIVER NDL LRG 8 DVNC XI (INSTRUMENTS) ×1 IMPLANT
DRIVER NDL MEGA SUTCUT DVNCXI (INSTRUMENTS) ×1 IMPLANT
DRIVER NDLE LRG 8 DVNC XI (INSTRUMENTS) IMPLANT
DRIVER NDLE MEGA SUTCUT DVNCXI (INSTRUMENTS) ×1 IMPLANT
ELECT REM PT RETURN 15FT ADLT (MISCELLANEOUS) ×1 IMPLANT
FORCEPS BPLR FENES DVNC XI (FORCEP) IMPLANT
FORCEPS CADIERE DVNC XI (FORCEP) ×1 IMPLANT
GAUZE 4X4 16PLY ~~LOC~~+RFID DBL (SPONGE) ×1 IMPLANT
GLOVE BIO SURGEON STRL SZ7.5 (GLOVE) ×2 IMPLANT
GLOVE INDICATOR 8.0 STRL GRN (GLOVE) ×2 IMPLANT
GOWN STRL REUS W/ TWL XL LVL3 (GOWN DISPOSABLE) ×3 IMPLANT
GRASPER SUT TROCAR 14GX15 (MISCELLANEOUS) ×1 IMPLANT
GRASPER TIP-UP FEN DVNC XI (INSTRUMENTS) ×1 IMPLANT
IRRIG SUCT STRYKERFLOW 2 WTIP (MISCELLANEOUS) IMPLANT
IRRIGATION SUCT STRKRFLW 2 WTP (MISCELLANEOUS) IMPLANT
IRRIGATOR SUCT 8 DISP DVNC XI (IRRIGATION / IRRIGATOR) IMPLANT
KIT BASIN OR (CUSTOM PROCEDURE TRAY) ×1 IMPLANT
KIT GASTRIC LAVAGE 34FR ADT (SET/KITS/TRAYS/PACK) ×1 IMPLANT
KIT TURNOVER KIT A (KITS) IMPLANT
LUBRICANT JELLY K Y 4OZ (MISCELLANEOUS) IMPLANT
MARKER SKIN DUAL TIP RULER LAB (MISCELLANEOUS) ×1 IMPLANT
MAT PREVALON FULL STRYKER (MISCELLANEOUS) ×1 IMPLANT
NDL SPNL 18GX3.5 QUINCKE PK (NEEDLE) ×1 IMPLANT
NEEDLE SPNL 18GX3.5 QUINCKE PK (NEEDLE) ×1 IMPLANT
OBTURATOR OPTICAL STND 8 DVNC (TROCAR) ×1 IMPLANT
OBTURATOR OPTICALSTD 8 DVNC (TROCAR) ×1 IMPLANT
PACK CARDIOVASCULAR III (CUSTOM PROCEDURE TRAY) ×1 IMPLANT
RELOAD STAPLE 60 2.5 WHT DVNC (STAPLE) ×3 IMPLANT
RELOAD STAPLE 60 3.5 BLU DVNC (STAPLE) IMPLANT
RELOAD STAPLER 2.5X60 WHT DVNC (STAPLE) ×1 IMPLANT
RELOAD STAPLER 3.5X60 BLU DVNC (STAPLE) ×3 IMPLANT
SCISSORS MNPLR CVD DVNC XI (INSTRUMENTS) ×1 IMPLANT
SEAL UNIV 5-12 XI (MISCELLANEOUS) ×4 IMPLANT
SEALER VESSEL EXT DVNC XI (MISCELLANEOUS) ×1 IMPLANT
SET TUBE SMOKE EVAC HIGH FLOW (TUBING) ×1 IMPLANT
SOL ELECTROSURG ANTI STICK (MISCELLANEOUS) ×1 IMPLANT
SOLUTION ANTFG W/FOAM PAD STRL (MISCELLANEOUS) ×1 IMPLANT
SOLUTION ELECTROSURG ANTI STCK (MISCELLANEOUS) ×1 IMPLANT
SPIKE FLUID TRANSFER (MISCELLANEOUS) ×1 IMPLANT
STAPLER 60 SUREFORM DVNC (STAPLE) ×1 IMPLANT
STAPLER RELOAD 2.5X60 WHT DVNC (STAPLE) ×1 IMPLANT
STAPLER RELOAD 3.5X60 BLU DVNC (STAPLE) ×3 IMPLANT
SUT DVC VLOC 180 2-0 12IN GS21 (SUTURE) ×2 IMPLANT
SUT MNCRL AB 4-0 PS2 18 (SUTURE) ×1 IMPLANT
SUT SILK 0 SH 30 (SUTURE) IMPLANT
SUT SILK 2 0 SH (SUTURE) ×2 IMPLANT
SUT V-LOC BARB 180 2/0GR6 GS22 (SUTURE) ×2 IMPLANT
SUT VIC AB 2-0 SH 27X BRD (SUTURE) IMPLANT
SUT VIC AB 2-0 SH 27XBRD (SUTURE) IMPLANT
SUT VICRYL 0 TIES 12 18 (SUTURE) ×1 IMPLANT
SUT VLOC BARB 180 ABS3/0GR12 (SUTURE) IMPLANT
SUTURE DVC VL 180 2-0 12INGS21 (SUTURE) IMPLANT
SUTURE V-LC BRB 180 2/0GR6GS22 (SUTURE) ×2 IMPLANT
SUTURE VLOC BRB 180 ABS3/0GR12 (SUTURE) ×2 IMPLANT
SYR 20ML LL LF (SYRINGE) ×1 IMPLANT
TOWEL OR 17X26 10 PK STRL BLUE (TOWEL DISPOSABLE) ×1 IMPLANT
TRAY FOLEY MTR SLVR 16FR STAT (SET/KITS/TRAYS/PACK) IMPLANT
TROCAR Z-THREAD FIOS 5X100MM (TROCAR) ×1 IMPLANT

## 2023-08-12 NOTE — Progress Notes (Signed)
 Progress Note: General Surgery Service   Chief Complaint/Subjective:  No acute events, no change.  Objective: Vital signs in last 24 hours: Temp:  [97.5 F (36.4 C)-98.7 F (37.1 C)] 98.2 F (36.8 C) (04/18 0640) Pulse Rate:  [87-101] 87 (04/18 0640) Resp:  [15-18] 16 (04/18 0640) BP: (118-150)/(60-74) 135/65 (04/18 0640) SpO2:  [99 %-100 %] 99 % (04/18 0640) Last BM Date : 08/09/23  Intake/Output from previous day: 04/17 0701 - 04/18 0700 In: 3089.5 [P.O.:1000; I.V.:2089.5] Out: 400 [Urine:400] Intake/Output this shift: No intake/output data recorded.  Gen: NAD Resp: breathing comfortably GI: soft, non distended, non tender. Superficial epidermal ulceration along previous upper midline incision  Lab Results: CBC  No results for input(s): "WBC", "HGB", "HCT", "PLT" in the last 72 hours.   BMET Recent Labs    08/10/23 0404 08/11/23 0309  NA 139 136  K 3.8 3.9  CL 106 105  CO2 26 25  GLUCOSE 91 86  BUN 23 25*  CREATININE 0.81 0.73  CALCIUM  8.7* 8.6*   PT/INR No results for input(s): "LABPROT", "INR" in the last 72 hours. ABG No results for input(s): "PHART", "HCO3" in the last 72 hours.  Invalid input(s): "PCO2", "PO2"  Anti-infectives: Anti-infectives (From admission, onward)    Start     Dose/Rate Route Frequency Ordered Stop   08/12/23 0730  [MAR Hold]  cefoTEtan  (CEFOTAN ) 1 g in sodium chloride  0.9 % 100 mL IVPB        (MAR Hold since Fri 08/12/2023 at 0623.Hold Reason: Transfer to a Procedural area)   1 g 200 mL/hr over 30 Minutes Intravenous  Once 08/11/23 1237         Medications: Scheduled Meds:  [MAR Hold] acetaminophen   650 mg Oral Q6H   [MAR Hold] Chlorhexidine  Gluconate Cloth  6 each Topical Daily   [MAR Hold] copper   2 mg Oral Daily   [MAR Hold] docusate sodium   100 mg Oral BID   [MAR Hold] furosemide   20 mg Oral Daily   [MAR Hold] meclizine   12.5 mg Oral BID   [MAR Hold] multivitamin with minerals  1 tablet Oral Daily   [MAR Hold]  nystatin    Topical TID   [MAR Hold] pantoprazole   40 mg Oral BID   phenylephrine        [MAR Hold] Ensure Max Protein  11 oz Oral Daily   [MAR Hold] sucralfate   1 g Oral TID WC & HS   [MAR Hold] thiamine   500 mg Oral Daily   [MAR Hold] zinc  sulfate (50mg  elemental zinc )  220 mg Oral Daily   Continuous Infusions:  acetaminophen      [MAR Hold] cefoTEtan  (CEFOTAN ) IV     lactated ringers      TPN ADULT (ION) 65 mL/hr at 08/11/23 1725   PRN Meds:.acetaminophen , [MAR Hold] alum & mag hydroxide-simeth, [MAR Hold] lip balm, [MAR Hold] ondansetron  **OR** [MAR Hold] ondansetron  (ZOFRAN ) IV, phenylephrine , [MAR Hold] promethazine , [MAR Hold] simethicone , [MAR Hold] sodium chloride  flush  Assessment/Plan: Ms. Lown has had a terrible year after a gastric bypass and is now inpatient for TPN while awaiting possible reversal.   Failure to thrive Severe protein calorie malnutrition - bariatric reg, TPN - Eliquis  for h/o DVT - stopped eliquis  on 4/15 for surgery on 4/18 - continue ambulation, pulm toilet - Follow up nutrition labs - Anemia of chronic disease. - Plan for reversal of gastric bypass and exploration of epigastric chronic wound this morning.  Discussed risks, benefits and alternatives and patient granted consent to  proceed.  Will proceed as scheduled.   Late entry note - patient was seen around lunch time  LOS: 16 days    [name], MD  [facility] Use [email or web address] to contact on call provider

## 2023-08-12 NOTE — Anesthesia Procedure Notes (Addendum)
 Procedure Name: Intubation Date/Time: 08/12/2023 8:07 AM  Performed by: Bing Buff, RNPre-anesthesia Checklist: Patient identified, Emergency Drugs available, Suction available and Patient being monitored Patient Re-evaluated:Patient Re-evaluated prior to induction Oxygen Delivery Method: Circle system utilized Preoxygenation: Pre-oxygenation with 100% oxygen Induction Type: IV induction Ventilation: Mask ventilation without difficulty Laryngoscope Size: Mac and 3 Grade View: Grade I Tube type: Oral Tube size: 7.0 mm Number of attempts: 1 Airway Equipment and Method: Stylet and Oral airway Placement Confirmation: ETT inserted through vocal cords under direct vision, positive ETCO2 and breath sounds checked- equal and bilateral Secured at: 21 cm Tube secured with: Tape Dental Injury: Teeth and Oropharynx as per pre-operative assessment

## 2023-08-12 NOTE — Progress Notes (Signed)
 PHARMACY - TOTAL PARENTERAL NUTRITION CONSULT NOTE   Indication:  Intolerance to enteral feeding   Patient Measurements: Height: 5\' 1"  (154.9 cm) Weight: 89.4 kg (197 lb 1.5 oz) IBW/kg (Calculated) : 47.8 TPN AdjBW (KG): 56.4 Body mass index is 37.24 kg/m.  Assessment:  72yoF with hx gastric bypass in May 2024, weight at that time 131 kg. Pt has had rapid weight loss, inability to tolerate solid foods, complaints of severe weakness, and problems with gastrostomy tube displacement (Oct - mid-Nov) and ulcer repairs in Nov/Dec 2024. 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 Surgery team recommending reversal of bypass.   Consult for 2 weeks of TPN prior to bypass reversal to optimize nutritional status, OR planned for 4/18.   Glucose / Insulin : A1c 6.3 in May 2024; not on insulin   -  Not on SSI/CBGs Electrolytes: labs on 4/17: CoCa 9.8, Phos 4.6  - no new labs on 4/18  Renal: labs on 4/17: Scr <1, BUN 25 - no new labs on 4/18 Hepatic: LFTs and Tbili wnl - albumin  2.3 - TG elevated:  340 (on 4/14), 331 (on 4/15) Intake / Output; MIVF:  - No mIVF - PO intake: 75-100% meals for the past two days  - Docusate BID, last BM 4/15 - UOP: 400 mL  (on lasix  20mg  PO daily) - I/O: +2689 mL GI Meds: Protonix  BID, Sucralfate  QID  GI Imaging:  - 4/2 CT Abdomen c/w findings of prior gastric bypass without evidence of bowel obstruction  GI Surgeries / Procedures: none since TPN initiation  Central access: 07/27/23 TPN start date: 07/28/23   Nutritional Goals: Goal TPN rate is 65 mL/hr (provides 78 g of protein and 1,575 kcals per day)  RD Assessment: on 4/3 Estimated Needs Total Energy Estimated Needs: 1500-1800 Total Protein Estimated Needs: 75-90g Total Fluid Estimated Needs: 1.8L/day  Current Nutrition:  TPN + full liquid bariatric diet, protein supplement (Ensure)  Supplements:  Copper  tablet 2mg  daily, oral MV, thiamine  500mg /d,  Zinc  220mg /d  Plan:   - patient is undergoing reversal of gastric bypass today(4/18)  At 1800:  - Continue TPN at 65 mL/hr at 1800 - Electrolytes in TPN:  Na 100 mEq/L K 45 mEq/L  Ca 2 mEq/L Mg 7 mEq/L Phos 10 mmol/L Cl:Ac 1:2   - Suppl Copper  tablet 2mg  daily, oral MV, thiamine  500mg /d, Zinc  220mg /d - Monitor TPN labs on Mon/Thurs & PRN - bmet, phos on 4/19  Sharlyn Deaner, PharmD, BCPS 08/12/2023 7:27 AM

## 2023-08-12 NOTE — Op Note (Signed)
 Patient: Christine [name] ([birth year], [id number])  Date of Surgery: 08/12/2023  Preoperative Diagnosis: FAILURE TO THRIVE AFTER GASTRIC BYPASS   Postoperative Diagnosis: FAILURE TO THRIVE AFTER GASTRIC BYPASS   Surgical Procedure:  ROBOTIC REVERSAL OF GASTRIC BYPASS  TAKEDOWN OF GASTROCUTANEOUS FISTULA  Operative Team Members:  Surgeons and Role:    * Siler Mavis, Avon Boers, MD - Primary   Anesthesiologist: Vernadine Golas, MD CRNA: Mervyn Ace, CRNA; Hunter Maha, CRNA; Vella Gey, CRNA Student Nurse Anesthetist: Bing Buff, RN   Anesthesia: General   Fluids:  Total I/O In: 1700 [I.V.:1500; IV Piggyback:200] Out: 100 [Urine:100]  Complications: None  Drains:  none   Specimen:  ID Type Source Tests Collected by Time Destination  1 : Roux-en-y with GJ anastomosis Tissue PATH GI Other SURGICAL PATHOLOGY Anitha Kreiser, Avon Boers, MD 08/12/2023 1111      Disposition:  PACU - hemodynamically stable.  Plan of Care: Admit to inpatient     Indications for Procedure: Eureka Valdes is a 73 y.o. female who presented with failure to thrive malnutrition after gastric bypass.  She did experience a perforated marginal ulcer requiring exploratory laparotomy back in December.  In general she has had a very difficult time with her gastric bypass for the last year and decision was made to proceed with reversal of gastric bypass.  She also had a chronic wound in her epigastric area that was along the previous laparotomy incision.  This wound Opening and closing and never fully healed.  I recommended exploration of this wound during surgery today.  We discussed the procedure itself as well as its risk, benefits, and alternatives.  After full discussion all questions answered the patient granted consent to proceed..   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  This portion of the anterior gastric wall was resected  and closed in multiple layers.  Expected Roux-en-Y gastric bypass anatomy was identified and the Roux limb and gastrojejunostomy was resected and the new gastro gastrostomy was created.   Description of Procedure:   On the date stated above the patient taken operating suite and placed in supine position.  General endotracheal anesthesia induced.  A timeout was completed verifying the correct patient, procedure, position, and equipment needed for the case.  The patient's abdomen was prepped and draped in usual sterile fashion.  I entered the abdomen using Optiview technique and placed 4 trocars across the mid abdomen.  The right most trocar was a 12 mm trocar for the stapler and the other 3 were 8 mm robotic trocars.  A liver retractor was not placed because there was dense adhesions in the upper abdomen and after these adhesions were lysed we found we did not need a liver retractor as it was adherent to the underside of the diaphragm.  The patient was placed in head up position and the robot was docked using the [employer] system.  I performed a careful lysis of adhesions and began identifying structures in the left upper quadrant.  The omentum had been used as a Psychologist, prison and probation services for the marginal ulcer and was still stuck to the anterior surface of the Roux limb near the gastric jejunostomy.  I was able to mobilize this back out out of the left upper quadrant and returned back down into the abdomen allowing more work room to work.  The gastric pouch and Roux limb were densely adherent to the underside of the liver and these adhesions were lysed sharply  using scissors.  The medial aspect of the gastric pouch and the Roux limb were then free.  I was able to elevate the Roux limb at the gastric jejunal anastomosis and inspect posteriorly.  Thankfully there were not many adhesions posteriorly.  The lateral aspect of the pouch and Roux limb were then mobilized being careful not to injure the jejunal mesentery.  There  was omentum adherent to the previous staple lines which was divided using the vessel sealer.  I was able to fully dissect out the pouch and Roux limb and there are associated blood supplies.  At this point I created a window for a stapler to resect the distal gastric pouch to incorporate the gastrojejunostomy in the specimen.  A blue load of the stapler was used to divide the inferior portion of the gastric pouch leaving enough pouch to create a new anastomosis.  This was inspected with the endoscope prior to dividing the tissue.  I ended up needing 2 loads of the blue load to resect across the gastric pouch.  With the staple line completed I then directed my attention toward resecting the Roux limb.  The Roux limb was run down to the jejunojejunostomy.  This was a W style jejunojejunostomy.  The Roux limb was identified as it entered into the anastomosis a window was made in the mesentery and it was divided using a white load of the robotic linear stapler.  I then worked to divide the mesentery of the Roux limb.  I used the vessel sealer and worked proximally.  Once this was complete the specimen was fully resected and placed in the right upper quadrant for removal at the end of the case.  I then created the gastrogastric anastomosis.  A posterior layer was created using running 2 oh V-Loc suture.  This connected the previous staple line of the gastric remnant to the previous and new staple line of the gastric pouch.  This created a posterior row of the anastomosis.  Enterotomies were then made anterior to the inferior aspect of this suture line and a blue load of the robotic linear stapler was inserted into both limbs of intestine and fired to create the anastomosis.  The common enterotomy was closed using 2 running 2 oh V-Loc sutures in canal fashion.  This completed the inner layer of the anastomosis.  The outer anterior layer was then run using 2 oh V-Loc suture.  The endoscope was advanced past the anastomosis  and everything appeared patent and appeared to hold air and be airtight underinflation.  I then addressed the gastrocutaneous fistula site.  The anterior wall of the stomach was resected to incorporate the fistulous tract and some normal gastric wall tissue.  The resected portion was passed off the field.  The resulting gastrotomy in the anterior gastric wall was closed in multiple layers trying to close mucosa the mucosa and serosa of the serosa.  And then it was imbricated and attempt to avoid recurrent gastrocutaneous fistula.  This was done using 2-0 Vicryl suture.  The endoscope was then used to inspect this repair which appeared airtight and so of the foregut was deflated with endoscope the endoscope removed.  The robot was then undocked and we converted to laparoscopic surgery.  The specimen was brought out through the right abdominal trocar incision.  This incision was enlarged using a Kelly at the fascial layer and the incision was enlarged at the skin.  The specimen was removed and passed off the field.  The  muscular layer was closed using 2 figure-of-eight 0 Vicryl sutures placed with the PMI suture passer.  Local was injected in the transversus abdominis muscle bilaterally.  The abdomen was deflated and trocars removed.  The skin was closed using 4-0 Monocryl and Dermabond.  All sponge needle counts were correct at the end of this case.  At the end of the case we reviewed the infection status of the case. 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  Contamination related to creating intestinal anastomosis.  Teddie Favre, MD General, Bariatric, & Minimally Invasive Surgery [facility], [place]

## 2023-08-12 NOTE — Transfer of Care (Signed)
 Immediate Anesthesia Transfer of Care Note  Patient: Christine [name]  Procedure(s) Performed: ROBOTIC REVERSAL OF GASTRIC BYPASS AND TAKEDOWN OF GASTROCUTANEOUS FISTULA  Patient Location: PACU  Anesthesia Type:General  Level of Consciousness: awake, alert , and oriented  Airway & Oxygen Therapy: Patient Spontanous Breathing and Patient connected to face mask oxygen  Post-op Assessment: Report given to RN and Post -op Vital signs reviewed and stable  Post vital signs: Reviewed and stable  Last Vitals:  Vitals Value Taken Time  BP 168/88 08/12/23 1145  Temp 36.1 C 08/12/23 1142  Pulse 83 08/12/23 1152  Resp 24 08/12/23 1152  SpO2 100 % 08/12/23 1152  Vitals shown include unfiled device data.  Last Pain:  Vitals:   08/12/23 1142  TempSrc:   PainSc: 0-No pain         Complications: No notable events documented.

## 2023-08-12 NOTE — Anesthesia Postprocedure Evaluation (Signed)
 Anesthesia Post Note  Patient: Christine [name]  Procedure(s) Performed: ROBOTIC REVERSAL OF GASTRIC BYPASS AND TAKEDOWN OF GASTROCUTANEOUS FISTULA     Patient location during evaluation: PACU Anesthesia Type: General Level of consciousness: awake and alert Pain management: pain level controlled Vital Signs Assessment: post-procedure vital signs reviewed and stable Respiratory status: spontaneous breathing, nonlabored ventilation and respiratory function stable Cardiovascular status: blood pressure returned to baseline Postop Assessment: no apparent nausea or vomiting Anesthetic complications: no   No notable events documented.  Last Vitals:  Vitals:   08/12/23 1142 08/12/23 1145  BP: (!) 175/90 (!) 168/88  Pulse: 82 82  Resp: (!) 22 (!) 23  Temp: (!) 36.1 C   SpO2: 100% 100%    Last Pain:  Vitals:   08/12/23 1142  TempSrc:   PainSc: 0-No pain                 Rayfield Cairo

## 2023-08-13 LAB — CBC
HCT: 25.7 % — ABNORMAL LOW (ref 36.0–46.0)
Hemoglobin: 7.6 g/dL — ABNORMAL LOW (ref 12.0–15.0)
MCH: 29.8 pg (ref 26.0–34.0)
MCHC: 29.6 g/dL — ABNORMAL LOW (ref 30.0–36.0)
MCV: 100.8 fL — ABNORMAL HIGH (ref 80.0–100.0)
Platelets: 285 10*3/uL (ref 150–400)
RBC: 2.55 MIL/uL — ABNORMAL LOW (ref 3.87–5.11)
RDW: 17.9 % — ABNORMAL HIGH (ref 11.5–15.5)
WBC: 8.6 10*3/uL (ref 4.0–10.5)
nRBC: 0 % (ref 0.0–0.2)

## 2023-08-13 LAB — BASIC METABOLIC PANEL WITH GFR
Anion gap: 9 (ref 5–15)
BUN: 31 mg/dL — ABNORMAL HIGH (ref 8–23)
CO2: 24 mmol/L (ref 22–32)
Calcium: 8.9 mg/dL (ref 8.9–10.3)
Chloride: 108 mmol/L (ref 98–111)
Creatinine, Ser: 0.86 mg/dL (ref 0.44–1.00)
GFR, Estimated: >60 mL/min (ref >60)
Glucose, Bld: 107 mg/dL — ABNORMAL HIGH (ref 70–99)
Potassium: 4.1 mmol/L (ref 3.5–5.1)
Sodium: 141 mmol/L (ref 135–145)

## 2023-08-13 LAB — PHOSPHORUS: Phosphorus: 4.3 mg/dL (ref 2.5–4.6)

## 2023-08-13 MED ORDER — STERILE WATER FOR INJECTION IV SOLN
INTRAMUSCULAR | Status: AC
  Filled 2023-08-13: qty 780

## 2023-08-13 MED ORDER — LOPERAMIDE HCL 2 MG PO CAPS
2.0000 mg | ORAL_CAPSULE | ORAL | Status: DC | PRN
  Administered 2023-08-13: 2 mg via ORAL
  Filled 2023-08-13: qty 1

## 2023-08-13 NOTE — Progress Notes (Signed)
 1 Day Post-Op   Subjective/Chief Complaint: Patient has had several episodes of loose diarrhea this morning No significant pain - abdominal soreness No nausea or vomiting   Objective: Vital signs in last 24 hours: Temp:  [97 F (36.1 C)-98.7 F (37.1 C)] 98.7 F (37.1 C) (04/19 0503) Pulse Rate:  [82-106] 106 (04/19 0503) Resp:  [15-23] 17 (04/19 0503) BP: (117-175)/(77-90) 117/78 (04/19 0503) SpO2:  [97 %-100 %] 97 % (04/19 0503) Weight:  [90.4 kg] 90.4 kg (04/19 0120) Last BM Date : 08/12/23  Intake/Output from previous day: 04/18 0701 - 04/19 0700 In: 3304.5 [P.O.:210; I.V.:2894.5; IV Piggyback:200] Out: 1220 [Urine:1200; Blood:20] Intake/Output this shift: Total I/O In: -  Out: 200 [Urine:200]  Gen: NAD Resp: breathing comfortably GI: soft, non distended, incisional tenderness. Superficial epidermal ulceration along previous upper midline incision New surgical incisions c/d/i  Lab Results:  Recent Labs    08/12/23 0739 08/13/23 0310  WBC 5.4 8.6  HGB 7.5* 7.6*  HCT 24.7* 25.7*  PLT 259 285   BMET Recent Labs    08/11/23 0309 08/13/23 0310  NA 136 141  K 3.9 4.1  CL 105 108  CO2 25 24  GLUCOSE 86 107*  BUN 25* 31*  CREATININE 0.73 0.86  CALCIUM  8.6* 8.9     Anti-infectives: Anti-infectives (From admission, onward)    Start     Dose/Rate Route Frequency Ordered Stop   08/12/23 0730  cefoTEtan  (CEFOTAN ) 1 g in sodium chloride  0.9 % 100 mL IVPB        1 g 200 mL/hr over 30 Minutes Intravenous  Once 08/11/23 1237 08/12/23 0810       Assessment/Plan: 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 Marny Sires Failure to thrive Severe protein calorie malnutrition - Advance to bari full liquids,  continue TPN - Low dose Imodium  for diarrhea - continue ambulation, pulm toilet - Follow up nutrition labs - Anemia of chronic disease. -    LOS: 17 days    Rella Cardinal 08/13/2023

## 2023-08-13 NOTE — Progress Notes (Signed)
 PHARMACY - TOTAL PARENTERAL NUTRITION CONSULT NOTE   Indication:  Intolerance to enteral feeding   Patient Measurements: Height: 5\' 1"  (154.9 cm) Weight: 90.4 kg (199 lb 4.7 oz) IBW/kg (Calculated) : 47.8 TPN AdjBW (KG): 56.4 Body mass index is 37.66 kg/m.  Assessment:  72yoF with hx gastric bypass in May 2024, weight at that time 131 kg. Pt has had rapid weight loss, inability to tolerate solid foods, complaints of severe weakness, and problems with gastrostomy tube displacement (Oct - mid-Nov) and ulcer repairs in Nov/Dec 2024. 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 Surgery team recommending reversal of bypass.   Consult for 2 weeks of TPN prior to bypass reversal to optimize nutritional status, OR planned for 4/18.   Glucose / Insulin : A1c 6.3 in May 2024; not on insulin   -  Not on SSI/CBGs Electrolytes:  CoCa 10.10, Phos 4.3  - other lytes wnl  Renal: Scr <1, BUN 31 Hepatic: LFTs and Tbili wnl - albumin  2.3 - TG elevated:  340 (on 4/14), 331 (on 4/15) Intake / Output; MIVF:  - No mIVF - PO intake: 75-100% meals for the past two days  - Docusate BID, last BM 4/15 - UOP: 1200 mL  (on lasix  20mg  PO daily) - I/O: +2084 mL GI Meds: Protonix  BID, Sucralfate  QID  GI Imaging:  - 4/2 CT Abdomen c/w findings of prior gastric bypass without evidence of bowel obstruction  GI Surgeries / Procedures: - 4/19: reversal of gastric bypass, takedown of gastrocutaneous fistula  Central access: 07/27/23 TPN start date: 07/28/23   Nutritional Goals: Goal TPN rate is 65 mL/hr (provides 78 g of protein and 1,575 kcals per day)  RD Assessment: on 4/3 Estimated Needs Total Energy Estimated Needs: 1500-1800 Total Protein Estimated Needs: 75-90g Total Fluid Estimated Needs: 1.8L/day  Current Nutrition:  TPN + full liquid bariatric diet, protein supplement (Ensure)  Supplements:  Copper  tablet 2mg  daily, oral MV, thiamine  500mg /d, Zinc   220mg /d  Plan:   At 1800:  - Continue TPN at 65 mL/hr at 1800 - Electrolytes in TPN:  Na 100 mEq/L K 45 mEq/L  Ca 2 mEq/L Mg 7 mEq/L Phos 10 mmol/L Cl:Ac 1:2   - Suppl Copper  tablet 2mg  daily, oral MV, thiamine  500mg /d, Zinc  220mg /d - Monitor TPN labs on Mon/Thurs & PRN   Sharlyn Deaner, PharmD, BCPS 08/13/2023 8:41 AM

## 2023-08-13 NOTE — Progress Notes (Signed)
 PT Cancellation Note  Patient Details Name: Christine [name] MRN: [id number] DOB: Apr 03, 1951   Cancelled Treatment:     PT attempted x 2 but pt with ongoing diarrhea - on second attempt pt reports seems to be resolving but requests to defer PT to tomorrow am for fear of re-occurrence with mobility in halls.  Will follow   Katrece Roediger 08/13/2023, 2:49 PM

## 2023-08-14 LAB — BASIC METABOLIC PANEL WITH GFR
Anion gap: 7 (ref 5–15)
BUN: 32 mg/dL — ABNORMAL HIGH (ref 8–23)
CO2: 25 mmol/L (ref 22–32)
Calcium: 8.6 mg/dL — ABNORMAL LOW (ref 8.9–10.3)
Chloride: 109 mmol/L (ref 98–111)
Creatinine, Ser: 0.84 mg/dL (ref 0.44–1.00)
GFR, Estimated: >60 mL/min (ref >60)
Glucose, Bld: 93 mg/dL (ref 70–99)
Potassium: 3.9 mmol/L (ref 3.5–5.1)
Sodium: 141 mmol/L (ref 135–145)

## 2023-08-14 LAB — CBC
HCT: 23.9 % — ABNORMAL LOW (ref 36.0–46.0)
Hemoglobin: 7 g/dL — ABNORMAL LOW (ref 12.0–15.0)
MCH: 29.9 pg (ref 26.0–34.0)
MCHC: 29.3 g/dL — ABNORMAL LOW (ref 30.0–36.0)
MCV: 102.1 fL — ABNORMAL HIGH (ref 80.0–100.0)
Platelets: 258 10*3/uL (ref 150–400)
RBC: 2.34 MIL/uL — ABNORMAL LOW (ref 3.87–5.11)
RDW: 18.3 % — ABNORMAL HIGH (ref 11.5–15.5)
WBC: 8.9 10*3/uL (ref 4.0–10.5)
nRBC: 0 % (ref 0.0–0.2)

## 2023-08-14 MED ORDER — TRAVASOL 10 % IV SOLN
INTRAVENOUS | Status: AC
  Filled 2023-08-14: qty 780

## 2023-08-14 NOTE — Progress Notes (Signed)
 Physical Therapy Treatment Patient Details Name: Christine [name] MRN: [id number] DOB: [birth year] Today's Date: 08/14/2023   History of Present Illness 73 y.o. female admitted with FTT, abdominal pain, inablilty to tolerate solids. Plan is for 2 weeks of TPN in prepartion for gastric bypass reversal. 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  Pt with gastric bypass reversal 08/11/23    PT Comments  Pt cooperative this am but moving slower and with slight increase in level of assist required prior to recent gastric bypass reversal 2* post op discomfort.     If plan is discharge home, recommend the following: A little help with bathing/dressing/bathroom;Assist for transportation;Assistance with cooking/housework;Help with stairs or ramp for entrance;A little help with walking and/or transfers   Can travel by private vehicle        Equipment Recommendations  None recommended by PT    Recommendations for Other Services       Precautions / Restrictions Precautions Precautions: Fall Recall of Precautions/Restrictions: Intact Precaution/Restrictions Comments: pt reports multiple falls in past 6 months Restrictions Weight Bearing Restrictions Per Provider Order: No     Mobility  Bed Mobility Overal bed mobility: Needs Assistance Bed Mobility: Supine to Sit     Supine to sit: HOB elevated, Used rails, Min assist     General bed mobility comments: Pt modified roll to sitting EOB and requiring min assist to bring trunk to upright    Transfers Overall transfer level: Needs assistance Equipment used: Rollator (4 wheels) Transfers: Sit to/from Stand Sit to Stand: Contact guard assist           General transfer comment: min cues for use of UEs to self assist    Ambulation/Gait Ambulation/Gait assistance: Contact guard assist, Supervision Gait Distance (Feet): 100 Feet Assistive device: Rollator (4 wheels) Gait Pattern/deviations:  Step-through pattern Gait velocity: decreased gait speed     General Gait Details: Tolerated well only mild dyspnea at end of walk, cues for upright posture - able to correct   Stairs             Wheelchair Mobility     Tilt Bed    Modified Rankin (Stroke Patients Only)       Balance Overall balance assessment: Needs assistance Sitting-balance support: Feet supported, No upper extremity supported Sitting balance-Leahy Scale: Good     Standing balance support: No upper extremity supported, Bilateral upper extremity supported Standing balance-Leahy Scale: Fair                              Hotel manager: No apparent difficulties  Cognition Arousal: Alert Behavior During Therapy: WFL for tasks assessed/performed   PT - Cognitive impairments: No apparent impairments                         Following commands: Intact      Cueing Cueing Techniques: Verbal cues  Exercises      General Comments        Pertinent Vitals/Pain Pain Assessment Pain Assessment: 0-10 Pain Score: 3  Pain Location: aboment Pain Descriptors / Indicators: Grimacing, Sore Pain Intervention(s): Limited activity within patient's tolerance, Monitored during session    Home Living                          Prior Function  PT Goals (current goals can now be found in the care plan section) Acute Rehab PT Goals Patient Stated Goal: likes to sleep and watch tv PT Goal Formulation: With patient Time For Goal Achievement: 08/11/23 Potential to Achieve Goals: Good Progress towards PT goals: Progressing toward goals    Frequency    Min 2X/week      PT Plan      Co-evaluation              AM-PAC PT "6 Clicks" Mobility   Outcome Measure  Help needed turning from your back to your side while in a flat bed without using bedrails?: None Help needed moving from lying on your back to sitting on the side of  a flat bed without using bedrails?: A Little Help needed moving to and from a bed to a chair (including a wheelchair)?: A Little Help needed standing up from a chair using your arms (e.g., wheelchair or bedside chair)?: A Little Help needed to walk in hospital room?: A Little Help needed climbing 3-5 steps with a railing? : A Little 6 Click Score: 19    End of Session Equipment Utilized During Treatment: Gait belt Activity Tolerance: Patient tolerated treatment well Patient left: in chair;with call bell/phone within reach;with bed alarm set Nurse Communication: Mobility status PT Visit Diagnosis: Difficulty in walking, not elsewhere classified (R26.2);History of falling (Z91.81)     Time: 1610-9604 PT Time Calculation (min) (ACUTE ONLY): 17 min  Charges:    $Gait Training: 8-22 mins PT General Charges $$ ACUTE PT VISIT: 1 Visit                     Thedora Finlay PT Acute Rehabilitation Services Pager [phone] Office [phone]    Shanavia Makela 08/14/2023, 1:04 PM

## 2023-08-14 NOTE — Progress Notes (Signed)
 2 Days Post-Op   Subjective/Chief Complaint: Less diarrhea Some incisional soreness No nausea or vomiting with full liquids   Objective: Vital signs in last 24 hours: Temp:  [98 F (36.7 C)-99.3 F (37.4 C)] 98.8 F (37.1 C) (04/20 0543) Pulse Rate:  [102-108] 102 (04/20 0543) Resp:  [15-16] 15 (04/20 0543) BP: (134-141)/(79-94) 134/83 (04/20 0543) SpO2:  [96 %-99 %] 96 % (04/20 0543) Last BM Date : 08/13/23  Intake/Output from previous day: 04/19 0701 - 04/20 0700 In: 2263.4 [P.O.:840; I.V.:1423.4] Out: 1350 [Urine:1350] Intake/Output this shift: Total I/O In: 60 [P.O.:60] Out: -   Gen: NAD Resp: breathing comfortably GI: soft, non distended, incisional tenderness. Superficial epidermal ulceration along previous upper midline incision New surgical incisions c/d/i    Lab Results:  Recent Labs    08/13/23 0310 08/14/23 0301  WBC 8.6 8.9  HGB 7.6* 7.0*  HCT 25.7* 23.9*  PLT 285 258   BMET Recent Labs    08/13/23 0310 08/14/23 0301  NA 141 141  K 4.1 3.9  CL 108 109  CO2 24 25  GLUCOSE 107* 93  BUN 31* 32*  CREATININE 0.86 0.84  CALCIUM  8.9 8.6*     Anti-infectives: Anti-infectives (From admission, onward)    Start     Dose/Rate Route Frequency Ordered Stop   08/12/23 0730  cefoTEtan  (CEFOTAN ) 1 g in sodium chloride  0.9 % 100 mL IVPB        1 g 200 mL/hr over 30 Minutes Intravenous  Once 08/11/23 1237 08/12/23 0810       Assessment/Plan: 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 Marny Sires Failure to thrive Severe protein calorie malnutrition - Bari full liquids,  continue TPN - Low dose Imodium  for diarrhea - continue ambulation, pulm toilet - Follow up nutrition labs - Anemia of chronic disease.  LOS: 18 days    Rella Cardinal 08/14/2023

## 2023-08-14 NOTE — Progress Notes (Signed)
 PHARMACY - TOTAL PARENTERAL NUTRITION CONSULT NOTE   Indication:  Intolerance to enteral feeding   Patient Measurements: Height: 5\' 1"  (154.9 cm) Weight: 90.4 kg (199 lb 4.7 oz) IBW/kg (Calculated) : 47.8 TPN AdjBW (KG): 56.4 Body mass index is 37.66 kg/m.  Assessment:  72yoF with hx gastric bypass in May 2024, weight at that time 131 kg. Pt has had rapid weight loss, inability to tolerate solid foods, complaints of severe weakness, and problems with gastrostomy tube displacement (Oct - mid-Nov) and ulcer repairs in Nov/Dec 2024. 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 Surgery team recommending reversal of bypass.   Consult for 2 weeks of TPN prior to bypass reversal to optimize nutritional status, OR planned for 4/18.   Glucose / Insulin : A1c 6.3 in May 2024; not on insulin   -  Not on SSI/CBGs Electrolytes:  CoCa 9.8 - other lytes wnl  Renal: Scr <1, BUN 32 ( trending up) Hepatic: LFTs and Tbili wnl - albumin  2.5 on 4/17 - TG elevated:  340 (on 4/14), 331 (on 4/15) Intake / Output; MIVF:  - No mIVF - PO intake: 75-100% meals for the past two days  - Docusate BID, last BM 4/19 - UOP:  (on lasix  20mg  PO daily) - I/O: +913 mL GI Meds: Protonix  BID, Sucralfate  QID  GI Imaging:  - 4/2 CT Abdomen c/w findings of prior gastric bypass without evidence of bowel obstruction  GI Surgeries / Procedures: - 4/19: reversal of gastric bypass, takedown of gastrocutaneous fistula  Central access: 07/27/23 TPN start date: 07/28/23   Nutritional Goals: Goal TPN rate is 65 mL/hr (provides 78 g of protein and 1,575 kcals per day)  - 4/19: adv to full liquids  RD Assessment: on 4/3 Estimated Needs Total Energy Estimated Needs: 1500-1800 Total Protein Estimated Needs: 75-90g Total Fluid Estimated Needs: 1.8L/day  Current Nutrition:  TPN + full liquid bariatric diet, protein supplement (Ensure)  Supplements:  Copper  tablet 2mg   daily, oral MV, thiamine  500mg /d, Zinc  220mg /d  Plan:   At 1800:  - Continue TPN at 65 mL/hr at 1800 - Electrolytes in TPN:  Na 100 mEq/L K 45 mEq/L  Ca 2 mEq/L Mg 7 mEq/L Phos 10 mmol/L Cl:Ac 1:2  - Suppl Copper  tablet 2mg  daily, oral MV, thiamine  500mg /d, Zinc  220mg /d - Monitor TPN labs on Mon/Thurs & PRN   Sharlyn Deaner, PharmD, BCPS 08/14/2023 9:35 AM

## 2023-08-14 NOTE — Plan of Care (Signed)
   Problem: Education: Goal: Knowledge of General Education information will improve Description Including pain rating scale, medication(s)/side effects and non-pharmacologic comfort measures Outcome: Progressing   Problem: Health Behavior/Discharge Planning: Goal: Ability to manage health-related needs will improve Outcome: Progressing

## 2023-08-15 LAB — COMPREHENSIVE METABOLIC PANEL WITH GFR
ALT: 16 U/L (ref 0–44)
AST: 24 U/L (ref 15–41)
Albumin: 2.1 g/dL — ABNORMAL LOW (ref 3.5–5.0)
Alkaline Phosphatase: 90 U/L (ref 38–126)
Anion gap: 7 (ref 5–15)
BUN: 30 mg/dL — ABNORMAL HIGH (ref 8–23)
CO2: 25 mmol/L (ref 22–32)
Calcium: 8.4 mg/dL — ABNORMAL LOW (ref 8.9–10.3)
Chloride: 108 mmol/L (ref 98–111)
Creatinine, Ser: 0.79 mg/dL (ref 0.44–1.00)
GFR, Estimated: >60 mL/min (ref >60)
Glucose, Bld: 91 mg/dL (ref 70–99)
Potassium: 3.8 mmol/L (ref 3.5–5.1)
Sodium: 140 mmol/L (ref 135–145)
Total Bilirubin: 0.4 mg/dL (ref 0.0–1.2)
Total Protein: 5.2 g/dL — ABNORMAL LOW (ref 6.5–8.1)

## 2023-08-15 LAB — MAGNESIUM: Magnesium: 1.9 mg/dL (ref 1.7–2.4)

## 2023-08-15 LAB — CBC
HCT: 21.3 % — ABNORMAL LOW (ref 36.0–46.0)
Hemoglobin: 6.3 g/dL — CL (ref 12.0–15.0)
MCH: 29.9 pg (ref 26.0–34.0)
MCHC: 29.6 g/dL — ABNORMAL LOW (ref 30.0–36.0)
MCV: 100.9 fL — ABNORMAL HIGH (ref 80.0–100.0)
Platelets: 234 10*3/uL (ref 150–400)
RBC: 2.11 MIL/uL — ABNORMAL LOW (ref 3.87–5.11)
RDW: 17.5 % — ABNORMAL HIGH (ref 11.5–15.5)
WBC: 7.6 10*3/uL (ref 4.0–10.5)
nRBC: 0 % (ref 0.0–0.2)

## 2023-08-15 LAB — PREPARE RBC (CROSSMATCH)

## 2023-08-15 LAB — TRIGLYCERIDES: Triglycerides: 180 mg/dL — ABNORMAL HIGH (ref <150)

## 2023-08-15 LAB — PREALBUMIN: Prealbumin: 19 mg/dL (ref 18–38)

## 2023-08-15 LAB — PHOSPHORUS: Phosphorus: 4.4 mg/dL (ref 2.5–4.6)

## 2023-08-15 LAB — SURGICAL PATHOLOGY

## 2023-08-15 MED ORDER — TRAVASOL 10 % IV SOLN
INTRAVENOUS | Status: AC
  Filled 2023-08-15: qty 780

## 2023-08-15 MED ORDER — SODIUM CHLORIDE 0.9% IV SOLUTION: Freq: Once | INTRAVENOUS | Status: AC

## 2023-08-15 NOTE — Progress Notes (Signed)
 3 Days Post-Op   Subjective/Chief Complaint: Tolerating liquids Bowel movements normalizing   Objective: Vital signs in last 24 hours: Temp:  [97.7 F (36.5 C)-98.7 F (37.1 C)] 97.7 F (36.5 C) (04/21 1327) Pulse Rate:  [92-103] 97 (04/21 1327) Resp:  [15-18] 18 (04/21 0928) BP: (136-149)/(72-97) 147/77 (04/21 1327) SpO2:  [98 %-100 %] 98 % (04/21 1327) Last BM Date : 08/14/23  Intake/Output from previous day: 04/20 0701 - 04/21 0700 In: 2179.9 [P.O.:600; I.V.:1579.9] Out: 1750 [Urine:1750] Intake/Output this shift: Total I/O In: 819 [P.O.:240; I.V.:221; Blood:358] Out: -   Gen: NAD Resp: breathing comfortably GI: soft, non distended, incisional tenderness. Superficial epidermal ulceration along previous upper midline incision scabbing over New surgical incisions c/d/i    Lab Results:  Recent Labs    08/14/23 0301 08/15/23 0304  WBC 8.9 7.6  HGB 7.0* 6.3*  HCT 23.9* 21.3*  PLT 258 234   BMET Recent Labs    08/14/23 0301 08/15/23 0304  NA 141 140  K 3.9 3.8  CL 109 108  CO2 25 25  GLUCOSE 93 91  BUN 32* 30*  CREATININE 0.84 0.79  CALCIUM  8.6* 8.4*     Anti-infectives: Anti-infectives (From admission, onward)    Start     Dose/Rate Route Frequency Ordered Stop   08/12/23 0730  cefoTEtan  (CEFOTAN ) 1 g in sodium chloride  0.9 % 100 mL IVPB        1 g 200 mL/hr over 30 Minutes Intravenous  Once 08/11/23 1237 08/12/23 0810       Assessment/Plan: 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 Marny Sires Failure to thrive Severe protein calorie malnutrition - Regular diet,  Half TPN  - Continue multivitamins - continue ambulation, pulm toilet - Follow up nutrition labs - Anemia of chronic disease.  LOS: 19 days    Avon Boers Kayin Kettering 08/15/2023

## 2023-08-15 NOTE — Plan of Care (Signed)
   Problem: Education: Goal: Knowledge of General Education information will improve Description: Including pain rating scale, medication(s)/side effects and non-pharmacologic comfort measures Outcome: Progressing   Problem: Nutrition: Goal: Adequate nutrition will be maintained Outcome: Progressing

## 2023-08-15 NOTE — Progress Notes (Signed)
 Verbal order by MD Aldean Hummingbird to transfuse 1 unit PRBCs.

## 2023-08-15 NOTE — Progress Notes (Signed)
 MD Aldean Hummingbird was notified of critical lab: hemoglobin is 6.3

## 2023-08-15 NOTE — Progress Notes (Signed)
 Blood transfusion started at 0631.

## 2023-08-15 NOTE — Progress Notes (Signed)
 PHARMACY - TOTAL PARENTERAL NUTRITION CONSULT NOTE   Indication:  Intolerance to enteral feeding   Patient Measurements: Height: 5\' 1"  (154.9 cm) Weight: 90.4 kg (199 lb 4.7 oz) IBW/kg (Calculated) : 47.8 TPN AdjBW (KG): 56.4 Body mass index is 37.66 kg/m.  Assessment:  72yoF with hx gastric bypass in May 2024, weight at that time 131 kg. Pt has had rapid weight loss, inability to tolerate solid foods, complaints of severe weakness, and problems with gastrostomy tube displacement (Oct - mid-Nov) and ulcer repairs in Nov/Dec 2024. 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 Surgery team recommending reversal of bypass.   Consult for 2 weeks of TPN prior to bypass reversal to optimize nutritional status, OR planned for 4/18.   Glucose / Insulin : A1c 6.3 in May 2024; not on insulin   -  Not on SSI/CBGs Electrolytes:  CoCa 9.9 - other lytes wnl  Renal: Scr <1, BUN 30 (improved) Hepatic: LFTs and Tbili wnl - albumin  2.1  - TG elevated but improving:  340 (on 4/14), 331 (on 4/15), 180 (on 4/21) Intake / Output; MIVF:  - No mIVF - PO intake: charted as taking 50% FL dinner last night  - Docusate BID, last BM 4/20 - UOP: 1700 mL  (on lasix  20mg  PO daily) - I/O:  + 206 mL - Receiving 1 unit PRBC 4/21 for Hgb 6.3 GI Meds: Protonix  BID, Sucralfate  QID  GI Imaging:  - 4/2 CT Abdomen c/w findings of prior gastric bypass without evidence of bowel obstruction  GI Surgeries / Procedures: - 4/18: reversal of gastric bypass, takedown of gastrocutaneous fistula  Central access: 07/27/23 TPN start date: 07/28/23   Nutritional Goals: Goal TPN rate is 65 mL/hr (provides 78 g of protein and 1,575 kcals per day)  - 4/19: adv to full liquids  RD Assessment: on 4/3 Estimated Needs Total Energy Estimated Needs: 1500-1800 Total Protein Estimated Needs: 75-90g Total Fluid Estimated Needs: 1.8L/day  Current Nutrition:  TPN + full liquid  bariatric diet, protein supplement (Ensure Max)  Supplements:  Copper  tablet 2mg  daily, oral MV, thiamine  500mg /d, Zinc  220mg /d  Plan:   At 1800:  - Continue TPN at 65 mL/hr at 1800 - Electrolytes in TPN:  Na 100 mEq/L K 45 mEq/L  Ca 2 mEq/L Mg 7 mEq/L Phos 10 mmol/L Cl:Ac 1:2  - Suppl Copper  tablet 2mg  daily, oral MV, thiamine  500mg /d, Zinc  220mg /d - Monitor TPN labs on Mon/Thurs & PRN  Ginnie Laine, PharmD, BCPS Clinical Pharmacist 08/15/2023  7:59 AM

## 2023-08-15 NOTE — Progress Notes (Signed)
 Occupational Therapy Treatment Patient Details Name: Christine [name] MRN: [id number] DOB: Sep 10, 1950 Today's Date: 08/15/2023   History of present illness 73 y.o. female admitted with FTT, abdominal pain, inablilty to tolerate solids. Plan is for 2 weeks of TPN in prepartion for gastric bypass reversal. 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  Pt with gastric bypass reversal 08/11/23   OT comments  Patient seen for skilled OT session this afternoon. 1st OT session since post op and patient performing well with all ADL and functional mobility. OT continues to educate on energy conservation strategies and progress overall activity tolerance. Patient continues to require skilled OT while in Acute care setting and anticipates patient will be able to discharge to home with [facility] when medically stable.       If plan is discharge home, recommend the following:  A little help with bathing/dressing/bathroom;Assistance with cooking/housework;Assist for transportation   Equipment Recommendations  None recommended by OT       Precautions / Restrictions Precautions Precautions: Fall Recall of Precautions/Restrictions: Intact Restrictions Weight Bearing Restrictions Per Provider Order: No       Mobility Bed Mobility Overal bed mobility: Needs Assistance Bed Mobility: Supine to Sit Rolling: Modified independent (Device/Increase time)   Supine to sit: HOB elevated, Used rails, Min assist Sit to supine: Supervision   General bed mobility comments: min cues for protection of abdominal area    Transfers Overall transfer level: Needs assistance Equipment used: Rollator (4 wheels) Transfers: Sit to/from Stand Sit to Stand: Supervision     Step pivot transfers: Supervision     General transfer comment: min cues for hand placement     Balance Overall balance assessment: Needs assistance Sitting-balance support: Feet supported, No upper extremity  supported Sitting balance-Leahy Scale: Good     Standing balance support: During functional activity, Bilateral upper extremity supported Standing balance-Leahy Scale: Fair                             ADL either performed or assessed with clinical judgement   ADL Overall ADL's : Needs assistance/impaired Eating/Feeding: Independent (still on TPN but eating a regular diet without self feeding issues)   Grooming: Wash/dry hands;Wash/dry face;Oral care;Applying deodorant;Modified independent;Sitting   Upper Body Bathing: Set up;Sitting   Lower Body Bathing: Contact guard assist;Sit to/from stand;Sitting/lateral leans   Upper Body Dressing : Set up;Sitting   Lower Body Dressing: Minimal assistance;Sit to/from stand;Sitting/lateral leans   Toilet Transfer: Rolling walker (2 wheels);Supervision/safety   Toileting- Architect and Hygiene: Supervision/safety       Functional mobility during ADLs: Supervision/safety General ADL Comments: min cues for compensatory strategies for post abdominal surgery pain    Extremity/Trunk Assessment Upper Extremity Assessment Upper Extremity Assessment: Generalized weakness   Lower Extremity Assessment Lower Extremity Assessment: Generalized weakness                 Communication Communication Communication: No apparent difficulties   Cognition Arousal: Alert Behavior During Therapy: WFL for tasks assessed/performed Cognition: No apparent impairments                               Following commands: Intact        Cueing   Cueing Techniques: Verbal cues        General Comments maintained 100% O2 sats on RA for in room mobioity and light ADL  Pertinent Vitals/ Pain       Pain Assessment Pain Assessment: Faces Faces Pain Scale: Hurts a little bit Pain Location: R sided abdomen Pain Descriptors / Indicators: Sore Pain Intervention(s): Monitored during session,  Repositioned   Frequency  Min 2X/week        Progress Toward Goals  OT Goals(current goals can now be found in the care plan section)  Progress towards OT goals: Progressing toward goals  Acute Rehab OT Goals Patient Stated Goal: to keep feeling better OT Goal Formulation: With patient Time For Goal Achievement: 08/25/23 Potential to Achieve Goals: Good ADL Goals Pt Will Perform Lower Body Bathing: with modified independence;with adaptive equipment;sit to/from stand Pt Will Perform Lower Body Dressing: with modified independence;sit to/from stand;with adaptive equipment Pt Will Transfer to Toilet: with modified independence;ambulating Additional ADL Goal #1: Pt will independently identify 3 strategies to reduce risk of falls Additional ADL Goal #2: Pt will independently identify 3 energy conservation techniques to increase independence and endurance for ADL/IADL tasks  Plan         AM-PAC OT "6 Clicks" Daily Activity     Outcome Measure   Help from another person eating meals?: None Help from another person taking care of personal grooming?: None Help from another person toileting, which includes using toliet, bedpan, or urinal?: A Little Help from another person bathing (including washing, rinsing, drying)?: A Little Help from another person to put on and taking off regular upper body clothing?: None Help from another person to put on and taking off regular lower body clothing?: A Little 6 Click Score: 21    End of Session Equipment Utilized During Treatment: Gait belt  OT Visit Diagnosis: Unsteadiness on feet (R26.81);Repeated falls (R29.6);Muscle weakness (generalized) (M62.81)   Activity Tolerance Patient tolerated treatment well   Patient Left in bed;with call bell/phone within reach;with bed alarm set   Nurse Communication Mobility status        Time: 6045-4098 OT Time Calculation (min): 28 min  Charges: OT General Charges $OT Visit: 1 Visit OT  Treatments $Therapeutic Activity: 23-37 mins  Marybella Ethier OT/L Acute Rehabilitation Department  [phone]  08/15/2023, 4:29 PM

## 2023-08-15 NOTE — TOC Progression Note (Signed)
 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 TOC will continue to follow.      Expected Discharge Plan: Home w Home Health Services Barriers to Discharge: Continued Medical Work up  Expected Discharge Plan and Services       Living arrangements for the past 2 months: Single Family Home                           HH Arranged: PT [facility] Agency: [facility] Date HH Agency Contacted: 07/28/23 Time HH Agency Contacted: [phone] Representative spoke with at [facility] Agency: Imelda Man   Social Determinants of Health (SDOH) Interventions SDOH Screenings   Food Insecurity: Food Insecurity Present (08/09/2023)  Housing: Low Risk  (08/09/2023)  Transportation Needs: Unmet Transportation Needs (08/09/2023)  Utilities: Not At Risk (08/09/2023)  Alcohol Screen: Low Risk  (06/24/2023)  Depression (PHQ2-9): Medium Risk (08/09/2023)  Financial Resource Strain: Low Risk  (06/24/2023)  Physical Activity: Insufficiently Active (06/24/2023)  Social Connections: Moderately Isolated (07/27/2023)  Stress: Stress Concern Present (08/09/2023)  Tobacco Use: Medium Risk (08/12/2023)  Health Literacy: Medium Risk (12/01/2022)   Received from [facility]    Readmission Risk Interventions    07/28/2023   12:55 PM 04/22/2023    2:07 PM 03/23/2023   10:43 AM  Readmission Risk Prevention Plan  Transportation Screening Complete Complete Complete  Medication Review Oceanographer) Complete Complete Complete  PCP or Specialist appointment within 3-5 days of discharge Complete  Complete  HRI or Home Care Consult Complete Complete Complete  SW Recovery Care/Counseling Consult Complete  Complete Complete  Palliative Care Screening Not Applicable Not Applicable Not Applicable  Skilled Nursing Facility Not Applicable Complete Complete

## 2023-08-16 ENCOUNTER — Ambulatory Visit (HOSPITAL_BASED_OUTPATIENT_CLINIC_OR_DEPARTMENT_OTHER): Admitting: General Surgery

## 2023-08-16 ENCOUNTER — Encounter: Payer: Self-pay | Admitting: Family Medicine

## 2023-08-16 LAB — CBC
HCT: 25.6 % — ABNORMAL LOW (ref 36.0–46.0)
Hemoglobin: 8 g/dL — ABNORMAL LOW (ref 12.0–15.0)
MCH: 30.2 pg (ref 26.0–34.0)
MCHC: 31.3 g/dL (ref 30.0–36.0)
MCV: 96.6 fL (ref 80.0–100.0)
Platelets: 258 10*3/uL (ref 150–400)
RBC: 2.65 MIL/uL — ABNORMAL LOW (ref 3.87–5.11)
RDW: 17.2 % — ABNORMAL HIGH (ref 11.5–15.5)
WBC: 6.1 10*3/uL (ref 4.0–10.5)
nRBC: 0 % (ref 0.0–0.2)

## 2023-08-16 LAB — TYPE AND SCREEN
ABO/RH(D): O POS
Antibody Screen: NEGATIVE
Unit division: 0

## 2023-08-16 LAB — BASIC METABOLIC PANEL WITH GFR
Anion gap: 7 (ref 5–15)
BUN: 26 mg/dL — ABNORMAL HIGH (ref 8–23)
CO2: 25 mmol/L (ref 22–32)
Calcium: 8.4 mg/dL — ABNORMAL LOW (ref 8.9–10.3)
Chloride: 102 mmol/L (ref 98–111)
Creatinine, Ser: 0.73 mg/dL (ref 0.44–1.00)
GFR, Estimated: >60 mL/min (ref >60)
Glucose, Bld: 83 mg/dL (ref 70–99)
Potassium: 3.8 mmol/L (ref 3.5–5.1)
Sodium: 134 mmol/L — ABNORMAL LOW (ref 135–145)

## 2023-08-16 LAB — BPAM RBC
Blood Product Expiration Date: 202505212359
ISSUE DATE / TIME: 202504210619
Unit Type and Rh: 5100

## 2023-08-16 MED ORDER — TRAVASOL 10 % IV SOLN
INTRAVENOUS | Status: AC
  Filled 2023-08-16: qty 360

## 2023-08-16 NOTE — Progress Notes (Signed)
 PHARMACY - TOTAL PARENTERAL NUTRITION CONSULT NOTE   Indication:  Intolerance to enteral feeding   Patient Measurements: Height: 5\' 1"  (154.9 cm) Weight: 89.5 kg (197 lb 5 oz) IBW/kg (Calculated) : 47.8 TPN AdjBW (KG): 56.4 Body mass index is 37.28 kg/m.  Assessment:  72yoF with hx gastric bypass in May 2024, weight at that time 131 kg. Pt has had rapid weight loss, inability to tolerate solid foods, complaints of severe weakness, and problems with gastrostomy tube displacement (Oct - mid-Nov) and ulcer repairs in Nov/Dec 2024. 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 Surgery team recommending reversal of bypass.   Consult for 2 weeks of TPN prior to bypass reversal to optimize nutritional status, OR planned for 4/18.   Glucose / Insulin : A1c 6.3 in May 2024; not on insulin   -  Not on SSI/CBGs Electrolytes:  Na 134, all others stable WNL Renal: Scr <1, BUN 26 Hepatic: LFTs and Tbili wnl -Albumin  2.1  -TG elevated but improving: 340 (4/14), 331 (4/15), 180 (4/21) Intake / Output; MIVF:  -PO intake: 100% and 60% of two meals documented 4/22 -Docusate BID, last BM 4/20 -UOP: 600 mL yesterday (on lasix  20mg  PO daily) GI Meds: Protonix  BID, Sucralfate  QID  GI Imaging:  - 4/2 CT Abdomen c/w findings of prior gastric bypass without evidence of bowel obstruction  GI Surgeries / Procedures: - 4/18: reversal of gastric bypass, takedown of gastrocutaneous fistula  Central access: 07/27/23 TPN start date: 07/28/23   Nutritional Goals: Goal TPN rate is 65 mL/hr (provides 78 g of protein and 1,575 kcals per day)  RD Assessment: on 4/3 Estimated Needs Total Energy Estimated Needs: 1500-1800 Total Protein Estimated Needs: 75-90g Total Fluid Estimated Needs: 1.8L/day  Current Nutrition:  TPN (half rate) + regular diet (starting 4/21), protein supplement (Ensure Max)  Supplements: Copper  tablet 2mg  daily, oral MV, thiamine  500mg /d,  Zinc  220mg /d  Plan:  -At 1800, reduce TPN to 30 mL/hr -Per discussion with Dr. Marny Sires, will reduce TPN to half rate and continue until clear patient is eating adequately; calorie count ordered -Electrolytes in TPN: Na 100 mEq/L, K 45 mEq/L, Ca 2 mEq/L, Mg 7 mEq/L, Phos 10 mmol/L. Cl:Ac 1:1 -Suppl Copper  tablet 2mg  daily, oral MV, thiamine  500mg /d, Zinc  220mg /d -Monitor TPN labs on Mon/Thurs & PRN  Lolita Rise, PharmD, BCPS Clinical Pharmacist 08/16/2023 11:05 AM

## 2023-08-16 NOTE — TOC Progression Note (Signed)
 Transition of Care [facility]) - Progression Note    Patient Details  Name: Christine [name] MRN: [id number] Date of Birth: Jun 01, 1950  [facility]) CM/SW Contact  Bari Leys, RN Phone Number: 08/16/2023, 12:37 PM  Clinical Narrative:   [facility] consult for SNF Placement- " She states she has no help at home, may need to consider SNF".  Per PT session 4/20 -ambulated 100 ft w/supervision, CGA for transfers. Teams chat sent to attending mostly likely would not get insurance approval for SNF, option is for [facility] PT/OT/RN/HHA. Teams chat from attending " okay that sounds good. She needs as much help as we can get her, but I understand how she wouldn't qualify for SNF ". TOC will continue to follow.     Expected Discharge Plan: Home w Home Health Services Barriers to Discharge: Continued Medical Work up  Expected Discharge Plan and Services       Living arrangements for the past 2 months: Single Family Home                           HH Arranged: PT [facility] Agency: [facility] Date HH Agency Contacted: 07/28/23 Time HH Agency Contacted: [phone] Representative spoke with at [facility] Agency: Imelda Man   Social Determinants of Health (SDOH) Interventions SDOH Screenings   Food Insecurity: Food Insecurity Present (08/09/2023)  Housing: Low Risk  (08/09/2023)  Transportation Needs: Unmet Transportation Needs (08/09/2023)  Utilities: Not At Risk (08/09/2023)  Alcohol Screen: Low Risk  (06/24/2023)  Depression (PHQ2-9): Medium Risk (08/09/2023)  Financial Resource Strain: Low Risk  (06/24/2023)  Physical Activity: Insufficiently Active (06/24/2023)  Social Connections: Moderately Isolated (07/27/2023)  Stress: Stress Concern Present (08/09/2023)  Tobacco Use: Medium Risk (08/12/2023)  Health Literacy: Medium Risk (12/01/2022)   Received from [facility]    Readmission Risk Interventions    07/28/2023   12:55 PM 04/22/2023    2:07 PM 03/23/2023   10:43 AM  Readmission Risk Prevention  Plan  Transportation Screening Complete Complete Complete  Medication Review Oceanographer) Complete Complete Complete  PCP or Specialist appointment within 3-5 days of discharge Complete  Complete  HRI or Home Care Consult Complete Complete Complete  SW Recovery Care/Counseling Consult Complete Complete Complete  Palliative Care Screening Not Applicable Not Applicable Not Applicable  Skilled Nursing Facility Not Applicable Complete Complete

## 2023-08-16 NOTE — Progress Notes (Signed)
 4 Days Post-Op   Subjective/Chief Complaint: Tolerating food if eats slowly. Pain at right most incision   Objective: Vital signs in last 24 hours: Temp:  [97.7 F (36.5 C)-98.5 F (36.9 C)] 98.2 F (36.8 C) (04/22 0643) Pulse Rate:  [92-97] 95 (04/22 0643) Resp:  [16-19] 16 (04/22 0643) BP: (143-162)/(77-119) 162/119 (04/22 0643) SpO2:  [98 %-99 %] 99 % (04/22 0643) Weight:  [89.5 kg] 89.5 kg (04/22 0500) Last BM Date : 08/14/23  Intake/Output from previous day: 04/21 0701 - 04/22 0700 In: 3228.3 [P.O.:1360; I.V.:1510.3; Blood:358] Out: 900 [Urine:900] Intake/Output this shift: Total I/O In: 250 [P.O.:240; I.V.:10] Out: -   Gen: NAD Resp: breathing comfortably GI: soft, non distended, incisional tenderness. Superficial epidermal ulceration along previous upper midline incision scabbing over New surgical incisions c/d/i    Lab Results:  Recent Labs    08/15/23 0304 08/16/23 0123  WBC 7.6 6.1  HGB 6.3* 8.0*  HCT 21.3* 25.6*  PLT 234 258   BMET Recent Labs    08/15/23 0304 08/16/23 0123  NA 140 134*  K 3.8 3.8  CL 108 102  CO2 25 25  GLUCOSE 91 83  BUN 30* 26*  CREATININE 0.79 0.73  CALCIUM  8.4* 8.4*     Anti-infectives: Anti-infectives (From admission, onward)    Start     Dose/Rate Route Frequency Ordered Stop   08/12/23 0730  cefoTEtan  (CEFOTAN ) 1 g in sodium chloride  0.9 % 100 mL IVPB        1 g 200 mL/hr over 30 Minutes Intravenous  Once 08/11/23 1237 08/12/23 0810       Assessment/Plan: 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 HGB 8.0  today from 6.3 yesterday after 1 unit PRBC   LOS: 20 days    Christine [name] 08/16/2023

## 2023-08-16 NOTE — Progress Notes (Signed)
 Physical Therapy Treatment Patient Details Name: Christine [name] MRN: [id number] DOB: 04/22/1951 Today's Date: 08/16/2023   History of Present Illness 73 y.o. female admitted with FTT, abdominal pain, inablilty to tolerate solids. Plan is for 2 weeks of TPN in prepartion for gastric bypass reversal. 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  Pt with gastric bypass reversal 08/11/23    PT Comments  Pt ambulated 100' with RW, no loss of balance. Pt stated she lives alone but can bring in private aide to assist with housework. She stated she can manage at home. HHPT recommended.     If plan is discharge home, recommend the following: A little help with bathing/dressing/bathroom;Assist for transportation;Assistance with cooking/housework;Help with stairs or ramp for entrance   Can travel by private vehicle        Equipment Recommendations  None recommended by PT    Recommendations for Other Services       Precautions / Restrictions Precautions Precautions: Fall Recall of Precautions/Restrictions: Intact Precaution/Restrictions Comments: pt reports multiple falls in past 6 months Restrictions Weight Bearing Restrictions Per Provider Order: No     Mobility  Bed Mobility Overal bed mobility: Needs Assistance Bed Mobility: Supine to Sit, Sit to Supine Rolling: Modified independent (Device/Increase time)   Supine to sit: HOB elevated, Used rails, Supervision Sit to supine: Min assist        Transfers Overall transfer level: Needs assistance Equipment used: Rollator (4 wheels) Transfers: Sit to/from Stand Sit to Stand: Supervision           General transfer comment: min cues for hand placement and to lock brakes of rollator    Ambulation/Gait Ambulation/Gait assistance: Supervision Gait Distance (Feet): 100 Feet Assistive device: Rollator (4 wheels) Gait Pattern/deviations: Step-through pattern, Trunk flexed Gait velocity:  decreased gait speed     General Gait Details: steady, no loss of balance, VCs for posture   Stairs             Wheelchair Mobility     Tilt Bed    Modified Rankin (Stroke Patients Only)       Balance Overall balance assessment: Needs assistance Sitting-balance support: Feet supported, No upper extremity supported Sitting balance-Leahy Scale: Good     Standing balance support: During functional activity, Bilateral upper extremity supported, Reliant on assistive device for balance Standing balance-Leahy Scale: Fair                              Hotel manager: No apparent difficulties  Cognition Arousal: Alert Behavior During Therapy: WFL for tasks assessed/performed                             Following commands: Intact      Cueing Cueing Techniques: Verbal cues  Exercises      General Comments        Pertinent Vitals/Pain Pain Assessment Pain Score: 4  Pain Location: R sided abdomen Pain Descriptors / Indicators: Sore Pain Intervention(s): Limited activity within patient's tolerance, Monitored during session    Home Living                          Prior Function            PT Goals (current goals can now be found in the care plan section) Acute Rehab PT Goals Patient Stated  Goal: likes to sleep and watch tv PT Goal Formulation: With patient Time For Goal Achievement: 08/11/23 Potential to Achieve Goals: Good Progress towards PT goals: Progressing toward goals    Frequency    Min 2X/week      PT Plan      Co-evaluation              AM-PAC PT "6 Clicks" Mobility   Outcome Measure  Help needed turning from your back to your side while in a flat bed without using bedrails?: None Help needed moving from lying on your back to sitting on the side of a flat bed without using bedrails?: A Little Help needed moving to and from a bed to a chair (including a wheelchair)?:  None Help needed standing up from a chair using your arms (e.g., wheelchair or bedside chair)?: None Help needed to walk in hospital room?: None Help needed climbing 3-5 steps with a railing? : A Little 6 Click Score: 22    End of Session Equipment Utilized During Treatment: Gait belt Activity Tolerance: Patient tolerated treatment well Patient left: with call bell/phone within reach;in bed Nurse Communication: Mobility status PT Visit Diagnosis: Difficulty in walking, not elsewhere classified (R26.2);History of falling (Z91.81);Pain     Time: 1342-1401 PT Time Calculation (min) (ACUTE ONLY): 19 min  Charges:    $Gait Training: 8-22 mins PT General Charges $$ ACUTE PT VISIT: 1 Visit                     Daymon Evans PT 08/16/2023  Acute Rehabilitation Services  Office [phone]

## 2023-08-17 LAB — BASIC METABOLIC PANEL WITH GFR
Anion gap: 7 (ref 5–15)
BUN: 27 mg/dL — ABNORMAL HIGH (ref 8–23)
CO2: 23 mmol/L (ref 22–32)
Calcium: 8.8 mg/dL — ABNORMAL LOW (ref 8.9–10.3)
Chloride: 106 mmol/L (ref 98–111)
Creatinine, Ser: 0.83 mg/dL (ref 0.44–1.00)
GFR, Estimated: >60 mL/min (ref >60)
Glucose, Bld: 86 mg/dL (ref 70–99)
Potassium: 3.7 mmol/L (ref 3.5–5.1)
Sodium: 136 mmol/L (ref 135–145)

## 2023-08-17 LAB — CBC
HCT: 26.8 % — ABNORMAL LOW (ref 36.0–46.0)
Hemoglobin: 8 g/dL — ABNORMAL LOW (ref 12.0–15.0)
MCH: 29.2 pg (ref 26.0–34.0)
MCHC: 29.9 g/dL — ABNORMAL LOW (ref 30.0–36.0)
MCV: 97.8 fL (ref 80.0–100.0)
Platelets: 257 10*3/uL (ref 150–400)
RBC: 2.74 MIL/uL — ABNORMAL LOW (ref 3.87–5.11)
RDW: 16.7 % — ABNORMAL HIGH (ref 11.5–15.5)
WBC: 5.7 10*3/uL (ref 4.0–10.5)
nRBC: 0 % (ref 0.0–0.2)

## 2023-08-17 MED ORDER — COPPER 2 MG PO TABS
2.0000 mg | Freq: Every day | Status: DC
  Administered 2023-08-17 – 2023-08-22 (×6): 2 mg via ORAL
  Filled 2023-08-17 (×6): qty 1

## 2023-08-17 MED ORDER — APIXABAN 5 MG PO TABS
5.0000 mg | ORAL_TABLET | Freq: Two times a day (BID) | ORAL | Status: DC
  Administered 2023-08-17 – 2023-08-23 (×13): 5 mg via ORAL
  Filled 2023-08-17 (×13): qty 1

## 2023-08-17 MED ORDER — TRAVASOL 10 % IV SOLN
INTRAVENOUS | Status: AC
  Filled 2023-08-17: qty 360

## 2023-08-17 NOTE — Plan of Care (Signed)

## 2023-08-17 NOTE — Progress Notes (Signed)
 Mobility Specialist - Progress Note   08/17/23 1021  Mobility  Activity Ambulated with assistance in hallway  Level of Assistance Standby assist, set-up cues, supervision of patient - no hands on  Assistive Device Four wheel walker  Distance Ambulated (ft) 160 ft  Activity Response Tolerated well  Mobility Referral Yes  Mobility visit 1 Mobility  Mobility Specialist Start Time (ACUTE ONLY) 0959  Mobility Specialist Stop Time (ACUTE ONLY) 1020  Mobility Specialist Time Calculation (min) (ACUTE ONLY) 21 min   Pt received in bed and agreeable to mobility. No complaints during session. Upon return, pt requested assistance to the bathroom. Pt to bed after session with all needs met.    [facility]

## 2023-08-17 NOTE — Progress Notes (Signed)
 PHARMACY - TOTAL PARENTERAL NUTRITION CONSULT NOTE   Indication:  Intolerance to enteral feeding   Patient Measurements: Height: 5\' 1"  (154.9 cm) Weight: 89.5 kg (197 lb 5 oz) IBW/kg (Calculated) : 47.8 TPN AdjBW (KG): 56.4 Body mass index is 37.28 kg/m.  Assessment:  72yoF with hx gastric bypass in May 2024, weight at that time 131 kg. Pt has had rapid weight loss, inability to tolerate solid foods, complaints of severe weakness, and problems with gastrostomy tube displacement (Oct - mid-Nov) and ulcer repairs in Nov/Dec 2024. 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 Surgery team recommending reversal of bypass.   Consult for 2 weeks of TPN prior to bypass reversal to optimize nutritional status, patient is s/p OR 4/18. Continues on TPN pending adequate oral intake.   Glucose / Insulin : A1c 6.3 in May 2024; not on insulin   - Not on SSI/CBGs Electrolytes: stable WNL Renal: Scr <1, BUN 27 Hepatic: LFTs and Tbili wnl -Albumin  2.1  -TG elevated but improving: 340 (4/14), 331 (4/15), 180 (4/21) Intake / Output; MIVF:  -PO intake: calorie count underway -Docusate BID, last BM 4/20 -UOP: 1 L yesterday (on lasix  20mg  PO daily) GI Meds: Protonix  BID, Sucralfate  QID  GI Imaging:  - 4/2 CT Abdomen c/w findings of prior gastric bypass without evidence of bowel obstruction  GI Surgeries / Procedures: - 4/18: reversal of gastric bypass, takedown of gastrocutaneous fistula  Central access: 07/27/23 TPN start date: 07/28/23   Nutritional Goals: Goal TPN rate is 65 mL/hr (provides 78 g of protein and 1,575 kcals per day)  RD Assessment: on 4/3 Estimated Needs Total Energy Estimated Needs: 1500-1800 Total Protein Estimated Needs: 75-90g Total Fluid Estimated Needs: 1.8L/day  Current Nutrition:  TPN (half rate) + regular diet (starting 4/21), protein supplement (Ensure Max)  Supplements: Copper  tablet 2mg  daily, oral MV, thiamine   500mg /d, Zinc  220mg /d  Plan:  -Continue reduced rate TPN 30 mL/hr -Calorie count in progress -Per discussion with Dr. Marny Sires, will continue TPN at half rate until clear patient is eating adequately -Electrolytes in TPN: Na 100 mEq/L, K 45 mEq/L, Ca 2 mEq/L, Mg 7 mEq/L, Phos 10 mmol/L. Cl:Ac 1:1 -Suppl Copper  tablet 2mg  daily, oral MV, thiamine  500mg /d, Zinc  220mg /d -Monitor TPN labs on Mon/Thurs & PRN  Lolita Rise, PharmD, BCPS Clinical Pharmacist 08/17/2023 9:43 AM

## 2023-08-17 NOTE — Progress Notes (Signed)
 Calorie Count Note  48 hour calorie count ordered.  Diet: Regular Supplements:  -Ensure MAX Protein po daily, each supplement provides 150 kcal and 30 grams of protein   Day 1: 4/23 Breakfast: 105 kcals, 5g protein 4/23 Lunch: pending 4/22 Dinner: 350 kcals, 10g protein Supplements: 1 Ensure Max =150 kcals, 30g protein  Total intake:  kcal (% of minimum estimated needs)   protein (% of minimum estimated needs)  Nutrition Dx: Severe Malnutrition related to chronic illness, altered GI function as evidenced by energy intake < or equal to 75% for > or equal to 1 month, percent weight loss.   Goal: Pt to meet >/= 90% of their estimated nutrition needs   Intervention:  -Continue Ensure MAX Protein po daily, each supplement provides 150 kcal and 30 grams of protein  -Continue Multivitamin with minerals daily -Continue Zinc  sulfate daily in AM -Continue copper  tablet daily in PM (must be given separately from zinc ) -Continue half rate TPN until closer to needs  Arna Better, MS, RD, LDN Inpatient Clinical Dietitian Contact via Secure chat

## 2023-08-17 NOTE — Progress Notes (Signed)
 5 Days Post-Op   Subjective/Chief Complaint: Tolerating food if eats slowly. Pain at right most incision   Objective: Vital signs in last 24 hours: Temp:  [97.9 F (36.6 C)-98.4 F (36.9 C)] 98.4 F (36.9 C) (04/23 0615) Pulse Rate:  [95-98] 95 (04/23 0615) Resp:  [8-18] 8 (04/23 0615) BP: (132-157)/(70-98) 132/70 (04/23 0615) SpO2:  [97 %-100 %] 97 % (04/23 0615) Last BM Date : 08/14/23  Intake/Output from previous day: 04/22 0701 - 04/23 0700 In: 1145.7 [P.O.:740; I.V.:405.7] Out: 400 [Urine:400] Intake/Output this shift: No intake/output data recorded.  Gen: NAD Resp: breathing comfortably GI: soft, non distended, incisional tenderness. Superficial epidermal ulceration along previous upper midline incision scabbing over New surgical incisions c/d/i    Lab Results:  Recent Labs    08/16/23 0123 08/17/23 0248  WBC 6.1 5.7  HGB 8.0* 8.0*  HCT 25.6* 26.8*  PLT 258 257   BMET Recent Labs    08/16/23 0123 08/17/23 0248  NA 134* 136  K 3.8 3.7  CL 102 106  CO2 25 23  GLUCOSE 83 86  BUN 26* 27*  CREATININE 0.73 0.83  CALCIUM  8.4* 8.8*     Anti-infectives: Anti-infectives (From admission, onward)    Start     Dose/Rate Route Frequency Ordered Stop   08/12/23 0730  cefoTEtan  (CEFOTAN ) 1 g in sodium chloride  0.9 % 100 mL IVPB        1 g 200 mL/hr over 30 Minutes Intravenous  Once 08/11/23 1237 08/12/23 0810       Assessment/Plan: 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 HGB stable at 12   LOS: 21 days    Christine [name] 08/17/2023

## 2023-08-18 LAB — COMPREHENSIVE METABOLIC PANEL WITH GFR
ALT: 31 U/L (ref 0–44)
AST: 49 U/L — ABNORMAL HIGH (ref 15–41)
Albumin: 2.5 g/dL — ABNORMAL LOW (ref 3.5–5.0)
Alkaline Phosphatase: 208 U/L — ABNORMAL HIGH (ref 38–126)
Anion gap: 8 (ref 5–15)
BUN: 20 mg/dL (ref 8–23)
CO2: 23 mmol/L (ref 22–32)
Calcium: 9 mg/dL (ref 8.9–10.3)
Chloride: 106 mmol/L (ref 98–111)
Creatinine, Ser: 0.65 mg/dL (ref 0.44–1.00)
GFR, Estimated: >60 mL/min (ref >60)
Glucose, Bld: 89 mg/dL (ref 70–99)
Potassium: 3.6 mmol/L (ref 3.5–5.1)
Sodium: 137 mmol/L (ref 135–145)
Total Bilirubin: 0.7 mg/dL (ref 0.0–1.2)
Total Protein: 6.1 g/dL — ABNORMAL LOW (ref 6.5–8.1)

## 2023-08-18 LAB — PHOSPHORUS: Phosphorus: 4.9 mg/dL — ABNORMAL HIGH (ref 2.5–4.6)

## 2023-08-18 LAB — MAGNESIUM: Magnesium: 2 mg/dL (ref 1.7–2.4)

## 2023-08-18 MED ORDER — TRAVASOL 10 % IV SOLN
INTRAVENOUS | Status: AC
  Filled 2023-08-18: qty 360

## 2023-08-18 NOTE — Progress Notes (Addendum)
 Calorie Count Note   48 hour calorie count ordered.   Diet: Regular Supplements:  -Ensure MAX Protein po daily, each supplement provides 150 kcal and 30 grams of protein    Day 2 : 4/24 Breakfast: 165 kcals, 1g protein 4/24 Lunch: pending 4/23 Dinner: 100 kcals, 7g protein Supplements: 1 Ensure Max still drinking currently, a few bites of Magic cup (~70 kcals, 2g protein)   Total intake:  kcal (% of minimum estimated needs)  g protein (% of minimum estimated needs)   Nutrition Dx: Severe Malnutrition related to chronic illness, altered GI function as evidenced by energy intake < or equal to 75% for > or equal to 1 month, percent weight loss.    Goal: Pt to meet >/= 90% of their estimated nutrition needs    Intervention:  -Continue Ensure MAX Protein po daily, each supplement provides 150 kcal and 30 grams of protein  -Continue Multivitamin with minerals daily -Continue Zinc  sulfate daily in AM -Continue copper  tablet daily in PM (must be given separately from zinc ) -Continue half rate TPN until closer to needs   Arna Better, MS, RD, LDN Inpatient Clinical Dietitian Contact via Secure chat

## 2023-08-18 NOTE — TOC Progression Note (Signed)
 Transition of Care [facility]) - Progression Note    Patient Details  Name: Christine [name] MRN: [id number] Date of Birth: [birth year]  [facility] CM/SW Contact  Bari Leys, RN Phone Number: 08/18/2023, 3:19 PM  Clinical Narrative:  Met with pt at bedside per patient's request regarding [facility] services, confirmed with patient [facility] arranged with [facility] and added to her discharge instructions, pt voiced understanding, had no further questions/concerns.      Expected Discharge Plan: Home w Home Health Services Barriers to Discharge: Continued Medical Work up  Expected Discharge Plan and Services       Living arrangements for the past 2 months: Single Family Home                           HH Arranged: PT [facility] Agency: [facility] Date HH Agency Contacted: 07/28/23 Time HH Agency Contacted: [id number] Representative spoke with at [facility] Agency: Imelda Man   Social Determinants of Health (SDOH) Interventions SDOH Screenings   Food Insecurity: Food Insecurity Present (08/09/2023)  Housing: Low Risk  (08/09/2023)  Transportation Needs: Unmet Transportation Needs (08/09/2023)  Utilities: Not At Risk (08/09/2023)  Alcohol Screen: Low Risk  (06/24/2023)  Depression (PHQ2-9): Medium Risk (08/09/2023)  Financial Resource Strain: Low Risk  (06/24/2023)  Physical Activity: Insufficiently Active (06/24/2023)  Social Connections: Moderately Isolated (07/27/2023)  Stress: Stress Concern Present (08/09/2023)  Tobacco Use: Medium Risk (08/12/2023)  Health Literacy: Medium Risk (12/01/2022)   Received from [facility]    Readmission Risk Interventions    07/28/2023   12:55 PM 04/22/2023    2:07 PM 03/23/2023   10:43 AM  Readmission Risk Prevention Plan  Transportation Screening Complete Complete Complete  Medication Review Oceanographer) Complete Complete Complete  PCP or Specialist appointment within 3-5 days of discharge Complete  Complete  HRI or Home Care  Consult Complete Complete Complete  SW Recovery Care/Counseling Consult Complete Complete Complete  Palliative Care Screening Not Applicable Not Applicable Not Applicable  Skilled Nursing Facility Not Applicable Complete Complete

## 2023-08-18 NOTE — Progress Notes (Signed)
 PHARMACY - TOTAL PARENTERAL NUTRITION CONSULT NOTE   Indication:  Intolerance to enteral feeding   Patient Measurements: Height: 5\' 1"  (154.9 cm) Weight: 89.5 kg (197 lb 5 oz) IBW/kg (Calculated) : 47.8 TPN AdjBW (KG): 56.4 Body mass index is 37.28 kg/m.  Assessment:  72yoF with hx gastric bypass in May 2024, weight at that time 131 kg. Pt has had rapid weight loss, inability to tolerate solid foods, complaints of severe weakness, and problems with gastrostomy tube displacement (Oct - mid-Nov) and ulcer repairs in Nov/Dec 2024. 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 Surgery team recommending reversal of bypass.   Consult for 2 weeks of TPN prior to bypass reversal to optimize nutritional status, patient is s/p OR 4/18. Continues on TPN pending adequate oral intake.   Glucose / Insulin : A1c 6.3 in May 2024; not on insulin   - Not on SSI/CBGs Electrolytes: phos up to 4.9, Ca up some but still WNL (corrCa 10.2) Renal: Scr <1, BUN 20 Hepatic: AST slight bump, alk phose up 208 -Albumin  2.5 -TG elevated but improving: 340 (4/14), 331 (4/15), 180 (4/21) Intake / Output; MIVF:  -PO intake: calorie count underway -Docusate BID, last BM 4/20 -UOP: none documented 4/23 (on lasix  20mg  PO daily) GI Meds: Protonix  BID, Sucralfate  QID  GI Imaging:  - 4/2 CT Abdomen c/w findings of prior gastric bypass without evidence of bowel obstruction  GI Surgeries / Procedures: - 4/18: reversal of gastric bypass, takedown of gastrocutaneous fistula  Central access: 07/27/23 TPN start date: 07/28/23   Nutritional Goals: Goal TPN rate is 65 mL/hr (provides 78 g of protein and 1,575 kcals per day)  RD Assessment: on 4/3 Estimated Needs Total Energy Estimated Needs: 1500-1800 Total Protein Estimated Needs: 75-90g Total Fluid Estimated Needs: 1.8L/day  Current Nutrition:  TPN (half rate) + regular diet (starting 4/21), protein supplement (Ensure  Max)  Supplements: Copper  tablet 2mg  daily, oral MV, thiamine  500mg /d, Zinc  220mg /d  Plan:  -Continue reduced rate TPN 30 mL/hr -Calorie count in progress -Per discussion with Dr. Marny Sires, will continue TPN at half rate until clear patient is eating adequately -Electrolytes in TPN: Na 100 mEq/L, K 45 mEq/L, Ca 2 mEq/L, Mg 7 mEq/L, Phos 5 mmol/L (half). Cl:Ac 1:1 -Suppl Copper  tablet 2mg  daily, oral MV, thiamine  500mg /d, Zinc  220mg /d -Monitor TPN labs on Mon/Thurs & PRN  Lolita Rise, PharmD, BCPS Clinical Pharmacist 08/18/2023 10:44 AM

## 2023-08-18 NOTE — Progress Notes (Addendum)
 Physical Therapy Treatment Patient Details Name: Christine [name] MRN: [id number] DOB: 02-Feb-1951 Today's Date: 08/18/2023   History of Present Illness 73 y.o. female admitted with FTT, abdominal pain, inablilty to tolerate solids. Plan is for 2 weeks of TPN in prepartion for gastric bypass reversal. 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  Pt with gastric bypass reversal 08/11/23    PT Comments  Pt is progressing well with mobility, she ambulated 125' with RW, no loss of balance. Pt maintains her trunk in a forward flexed position while walking 2* increased R sided abdominal pain if she attempts to stand more upright. Pt performed seated BUE/LE exercises for strengthening. She is mobilizing well at a modified independent level and is ready DC home from a PT standpoint. Pt has met PT goals, will sign off and plan for mobility team to continue following her.    If plan is discharge home, recommend the following: A little help with bathing/dressing/bathroom;Assist for transportation;Assistance with cooking/housework;Help with stairs or ramp for entrance   Can travel by private vehicle        Equipment Recommendations  None recommended by PT    Recommendations for Other Services       Precautions / Restrictions Precautions Precautions: Fall Recall of Precautions/Restrictions: Intact Precaution/Restrictions Comments: pt reports multiple falls in past 6 months Restrictions Weight Bearing Restrictions Per Provider Order: No     Mobility  Bed Mobility Overal bed mobility: Modified Independent Bed Mobility: Rolling, Sidelying to Sit Rolling: Modified independent (Device/Increase time) Sidelying to sit: Modified independent (Device/Increase time), HOB elevated, Used rails            Transfers Overall transfer level: Modified independent Equipment used: Rollator (4 wheels) Transfers: Sit to/from Stand Sit to Stand: Modified independent  (Device/Increase time)                Ambulation/Gait Ambulation/Gait assistance: Supervision Gait Distance (Feet): 125 Feet Assistive device: Rollator (4 wheels) Gait Pattern/deviations: Step-through pattern, Trunk flexed Gait velocity: decreased gait speed     General Gait Details: steady, no loss of balance, VCs for posture (pt stated it is painful to stand upright, encouraged her to try to bring trunk erect as R sided abdominal pain eases over time)   Stairs             Wheelchair Mobility     Tilt Bed    Modified Rankin (Stroke Patients Only)       Balance Overall balance assessment: Needs assistance Sitting-balance support: Feet supported, No upper extremity supported Sitting balance-Leahy Scale: Good     Standing balance support: During functional activity, Bilateral upper extremity supported, Reliant on assistive device for balance Standing balance-Leahy Scale: Fair                              Hotel manager: No apparent difficulties  Cognition Arousal: Alert Behavior During Therapy: WFL for tasks assessed/performed   PT - Cognitive impairments: No apparent impairments                         Following commands: Intact      Cueing Cueing Techniques: Verbal cues  Exercises General Exercises - Lower Extremity Ankle Circles/Pumps: AROM, Both, 10 reps, Seated Long Arc Quad: AROM, Both, 10 reps, Seated Hip Flexion/Marching: AROM, Both, 5 reps, Seated    General Comments  Pertinent Vitals/Pain Pain Assessment Pain Score: 3  Pain Location: R sided abdomen Pain Descriptors / Indicators: Sore Pain Intervention(s): Limited activity within patient's tolerance, Monitored during session, Premedicated before session, Repositioned    Home Living                          Prior Function            PT Goals (current goals can now be found in the care plan section) Acute Rehab  PT Goals Patient Stated Goal: likes to sleep and watch tv PT Goal Formulation: With patient Time For Goal Achievement: 09/01/23 Potential to Achieve Goals: Good Progress towards PT goals: Goals met/education completed, patient discharged from PT    Frequency    Min 3X/week      PT Plan      Co-evaluation              AM-PAC PT "6 Clicks" Mobility   Outcome Measure  Help needed turning from your back to your side while in a flat bed without using bedrails?: None Help needed moving from lying on your back to sitting on the side of a flat bed without using bedrails?: A Little Help needed moving to and from a bed to a chair (including a wheelchair)?: None Help needed standing up from a chair using your arms (e.g., wheelchair or bedside chair)?: None Help needed to walk in hospital room?: None Help needed climbing 3-5 steps with a railing? : A Little 6 Click Score: 22    End of Session Equipment Utilized During Treatment: Gait belt Activity Tolerance: Patient tolerated treatment well Patient left: in chair;with call bell/phone within reach Nurse Communication: Mobility status PT Visit Diagnosis: Difficulty in walking, not elsewhere classified (R26.2);History of falling (Z91.81);Pain     Time: 1610-9604 PT Time Calculation (min) (ACUTE ONLY): 17 min  Charges:    $Gait Training: 8-22 mins PT General Charges $$ ACUTE PT VISIT: 1 Visit                     Daymon Evans PT 08/18/2023  Acute Rehabilitation Services  Office [phone]

## 2023-08-18 NOTE — Progress Notes (Signed)
 Occupational Therapy Treatment Patient Details Name: Christine [name] MRN: [id number] DOB: November 19, 1950 Today's Date: 08/18/2023   History of present illness 73 y.o. female admitted with FTT, abdominal pain, inablilty to tolerate solids. Plan is for 2 weeks of TPN in prepartion for gastric bypass reversal. 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  Pt with gastric bypass reversal 08/11/23   OT comments  Patient seen for skilled OT sesison this afternoon. Patient able to tolerate light therex as outlined below. Education ongoing for ECTs for BADL's and mobility. Will contiue to follow while in Acute setting to progress toward independent level and continue to recommend HHOT sevices once discharged.       If plan is discharge home, recommend the following:  A little help with bathing/dressing/bathroom;Assistance with cooking/housework;Assist for transportation   Equipment Recommendations  None recommended by OT       Precautions / Restrictions Precautions Precautions: Fall Recall of Precautions/Restrictions: Intact Restrictions Weight Bearing Restrictions Per Provider Order: No       Mobility Bed Mobility Overal bed mobility: Modified Independent                  Transfers             Step pivot transfers: Modified independent (Device/Increase time)     General transfer comment: recliner to bed level with mod I     Balance           Standing balance support: During functional activity, Bilateral upper extremity supported, Reliant on assistive device for balance Standing balance-Leahy Scale: Fair Standing balance comment: rollator to ambulate ; ADLs without UE support                           ADL either performed or assessed with clinical judgement   ADL Overall ADL's : Needs assistance/impaired                     Lower Body Dressing: Contact guard assist;Sitting/lateral leans;Sit to/from  stand Lower Body Dressing Details (indicate cue type and reason): worked with patient on standing tolerance and balance with UE restorator on "no resistance" setting for 2 trials of 25 reps             Functional mobility during ADLs: Supervision/safety General ADL Comments: ECTs wih min cues    Extremity/Trunk Assessment Upper Extremity Assessment Upper Extremity Assessment: Generalized weakness   Lower Extremity Assessment Lower Extremity Assessment: Generalized weakness        Vision       Perception     Praxis     Communication Communication Communication: No apparent difficulties   Cognition Arousal: Alert Behavior During Therapy: WFL for tasks assessed/performed Cognition: No apparent impairments                               Following commands: Intact        Cueing      Exercises Exercises:  (see above for UE restorator)    Shoulder Instructions       General Comments 100% SpO2 on RA    Pertinent Vitals/ Pain       Pain Assessment Pain Assessment: Faces Faces Pain Scale: Hurts a little bit Pain Location: R sided abdomen Pain Descriptors / Indicators: Sore Pain Intervention(s): Limited activity within patient's tolerance, Repositioned, Relaxation   Frequency  Min 2X/week  Progress Toward Goals  OT Goals(current goals can now be found in the care plan section)  Progress towards OT goals: Progressing toward goals  Acute Rehab OT Goals Patient Stated Goal: o keep progressing for home OT Goal Formulation: With patient Time For Goal Achievement: 08/25/23 Potential to Achieve Goals: Good ADL Goals Pt Will Perform Lower Body Bathing: with modified independence;with adaptive equipment;sit to/from stand Pt Will Perform Lower Body Dressing: with modified independence;sit to/from stand;with adaptive equipment Pt Will Transfer to Toilet: with modified independence;ambulating Additional ADL Goal #1: Pt will independently  identify 3 strategies to reduce risk of falls Additional ADL Goal #2: Pt will independently identify 3 energy conservation techniques to increase independence and endurance for ADL/IADL tasks  Plan         AM-PAC OT "6 Clicks" Daily Activity     Outcome Measure   Help from another person eating meals?: None Help from another person taking care of personal grooming?: None Help from another person toileting, which includes using toliet, bedpan, or urinal?: A Little Help from another person bathing (including washing, rinsing, drying)?: A Little Help from another person to put on and taking off regular upper body clothing?: None Help from another person to put on and taking off regular lower body clothing?: A Little 6 Click Score: 21    End of Session Equipment Utilized During Treatment: Gait belt;Rollator (4 wheels)  OT Visit Diagnosis: Unsteadiness on feet (R26.81);Repeated falls (R29.6);Muscle weakness (generalized) (M62.81)   Activity Tolerance Patient tolerated treatment well   Patient Left in bed;with call bell/phone within reach;with bed alarm set   Nurse Communication Mobility status        Time: 1516-1530 OT Time Calculation (min): 14 min  Charges: OT General Charges $OT Visit: 1 Visit OT Treatments $Therapeutic Exercise: 8-22 mins  Myrl Lazarus OT/L Acute Rehabilitation Department  [phone]  08/18/2023, 4:35 PM

## 2023-08-19 LAB — BASIC METABOLIC PANEL WITH GFR
Anion gap: 8 (ref 5–15)
BUN: 19 mg/dL (ref 8–23)
CO2: 23 mmol/L (ref 22–32)
Calcium: 8.5 mg/dL — ABNORMAL LOW (ref 8.9–10.3)
Chloride: 106 mmol/L (ref 98–111)
Creatinine, Ser: 0.86 mg/dL (ref 0.44–1.00)
GFR, Estimated: >60 mL/min (ref >60)
Glucose, Bld: 93 mg/dL (ref 70–99)
Potassium: 3.5 mmol/L (ref 3.5–5.1)
Sodium: 137 mmol/L (ref 135–145)

## 2023-08-19 LAB — PHOSPHORUS: Phosphorus: 4.4 mg/dL (ref 2.5–4.6)

## 2023-08-19 MED ORDER — TRAVASOL 10 % IV SOLN
INTRAVENOUS | Status: AC
  Filled 2023-08-19: qty 360

## 2023-08-19 MED ORDER — PROSOURCE PLUS PO LIQD
30.0000 mL | Freq: Three times a day (TID) | ORAL | Status: DC
  Administered 2023-08-19 – 2023-08-22 (×6): 30 mL via ORAL
  Filled 2023-08-19 (×6): qty 30

## 2023-08-19 MED ORDER — CYANOCOBALAMIN 1000 MCG/ML IJ SOLN
1000.0000 ug | Freq: Once | INTRAMUSCULAR | Status: AC
  Administered 2023-08-19: 1000 ug via INTRAMUSCULAR
  Filled 2023-08-19: qty 1

## 2023-08-19 MED ORDER — POTASSIUM CHLORIDE CRYS ER 20 MEQ PO TBCR
40.0000 meq | EXTENDED_RELEASE_TABLET | Freq: Once | ORAL | Status: DC
  Filled 2023-08-19: qty 2

## 2023-08-19 MED ORDER — THIAMINE MONONITRATE 100 MG PO TABS
200.0000 mg | ORAL_TABLET | Freq: Every day | ORAL | Status: DC
  Administered 2023-08-20 – 2023-08-23 (×4): 200 mg via ORAL
  Filled 2023-08-19 (×4): qty 2

## 2023-08-19 MED ORDER — POTASSIUM CHLORIDE 20 MEQ PO PACK
40.0000 meq | PACK | Freq: Once | ORAL | Status: AC
  Administered 2023-08-19: 40 meq via ORAL
  Filled 2023-08-19: qty 2

## 2023-08-19 NOTE — Progress Notes (Signed)
 PHARMACY - TOTAL PARENTERAL NUTRITION CONSULT NOTE   Indication:  Intolerance to enteral feeding   Patient Measurements: Height: 5\' 1"  (154.9 cm) Weight: 89.5 kg (197 lb 5 oz) IBW/kg (Calculated) : 47.8 TPN AdjBW (KG): 56.4 Body mass index is 37.28 kg/m.  Assessment:  72yoF with hx gastric bypass in May 2024, weight at that time 131 kg. Pt has had rapid weight loss, inability to tolerate solid foods, complaints of severe weakness, and problems with gastrostomy tube displacement (Oct - mid-Nov) and ulcer repairs in Nov/Dec 2024. 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 Surgery team recommending reversal of bypass.   Consult for 2 weeks of TPN prior to bypass reversal to optimize nutritional status, patient is s/p OR 4/18. Continues on TPN pending adequate oral intake.   Glucose / Insulin : A1c 6.3 in May 2024; not on insulin   - Not on SSI/CBGs Electrolytes: phos 4.4 after cut to 1/2 in TPN 4/24, K 3.5 Renal: Scr <1, BUN WNL Hepatic:  4/24: AST slight bump, alk phose up 208 -Albumin  remains low -TG elevated but improving: 340 (4/14), 331 (4/15), 180 (4/21) Intake / Output; MIVF:  -PO intake: calorie count - see RD note, 1260 ml po intake recorded -Docusate BID (refusing)  last BM 4/20 -UOP: 2375 mls (on lasix  20mg  PO daily) GI Meds: Protonix  BID, Sucralfate  QID  GI Imaging:  - 4/2 CT Abdomen c/w findings of prior gastric bypass without evidence of bowel obstruction  GI Surgeries / Procedures: - 4/18: reversal of gastric bypass, takedown of gastrocutaneous fistula  Central access: 07/27/23 TPN start date: 07/28/23   Nutritional Goals: Goal TPN rate is 65 mL/hr (provides 78 g of protein and 1,575 kcals per day)  RD Assessment: on 4/3 Estimated Needs Total Energy Estimated Needs: 1500-1800 Total Protein Estimated Needs: 75-90g Total Fluid Estimated Needs: 1.8L/day  Current Nutrition:  TPN (half rate) + regular diet  (starting 4/21), protein supplement (Ensure Max)  Supplements: Copper  tablet 2mg  daily, oral MV, thiamine  500mg /d, Zinc  220mg /d  Plan:  Now: Kdur 40 po x 1 dose -Continue reduced rate TPN 30 mL/hr -Calorie count per RD: 4/24 - not meeting goals  -Per discussion with Dr. Marny Sires, will continue TPN at half rate until clear patient is eating adequately -Electrolytes in TPN: Na 100 mEq/L, K 45 mEq/L, Ca 2 mEq/L, Mg 7 mEq/L, Phos 5 mmol/L (half). Cl:Ac 1:1 -Suppl Copper  tablet 2mg  daily, oral MV, thiamine  500mg /d, Zinc  220mg /d -Monitor TPN labs on Mon/Thurs & PRN   Rubie Corona, Pharm.D Use secure chat for questions 08/19/2023 8:36 AM

## 2023-08-19 NOTE — Plan of Care (Signed)

## 2023-08-19 NOTE — Progress Notes (Signed)
 7 Days Post-Op   Subjective/Chief Complaint: Didn't eat enough yesterday.  Meat doesn't go down easily.   Objective: Vital signs in last 24 hours: Temp:  [97.8 F (36.6 C)-98.7 F (37.1 C)] 98.5 F (36.9 C) (04/25 0558) Pulse Rate:  [96-102] 96 (04/25 0558) Resp:  [17-18] 18 (04/25 0558) BP: (134-153)/(60-84) 134/64 (04/25 0558) SpO2:  [98 %-100 %] 98 % (04/25 0558) Last BM Date : 08/14/23  Intake/Output from previous day: 04/24 0701 - 04/25 0700 In: 1492.1 [P.O.:1200; I.V.:292.1] Out: 2375 [Urine:2375] Intake/Output this shift: Total I/O In: 297.2 [P.O.:120; I.V.:177.2] Out: -   Gen: NAD Resp: breathing comfortably GI: soft, non distended, incisional tenderness. Superficial epidermal ulceration along previous upper midline incision scabbing over New surgical incisions c/d/i    Lab Results:  Recent Labs    08/17/23 0248  WBC 5.7  HGB 8.0*  HCT 26.8*  PLT 257   BMET Recent Labs    08/18/23 0216 08/19/23 0257  NA 137 137  K 3.6 3.5  CL 106 106  CO2 23 23  GLUCOSE 89 93  BUN 20 19  CREATININE 0.65 0.86  CALCIUM  9.0 8.5*     Anti-infectives: Anti-infectives (From admission, onward)    Start     Dose/Rate Route Frequency Ordered Stop   08/12/23 0730  cefoTEtan  (CEFOTAN ) 1 g in sodium chloride  0.9 % 100 mL IVPB        1 g 200 mL/hr over 30 Minutes Intravenous  Once 08/11/23 1237 08/12/23 0810       Assessment/Plan: 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 HGB stable at 12   LOS: 23 days    [name] 08/19/2023

## 2023-08-19 NOTE — Plan of Care (Signed)
   Problem: Education: Goal: Knowledge of General Education information will improve Description: Including pain rating scale, medication(s)/side effects and non-pharmacologic comfort measures Outcome: Progressing   Problem: Activity: Goal: Risk for activity intolerance will decrease Outcome: Progressing

## 2023-08-19 NOTE — Progress Notes (Signed)
 Mobility Specialist - Progress Note   08/19/23 1150  Mobility  Activity Ambulated with assistance in hallway  Level of Assistance Modified independent, requires aide device or extra time  Assistive Device Four wheel walker  Distance Ambulated (ft) 300 ft  Activity Response Tolerated well  Mobility Referral Yes  Mobility visit 1 Mobility  Mobility Specialist Start Time (ACUTE ONLY) 1132  Mobility Specialist Stop Time (ACUTE ONLY) 1150  Mobility Specialist Time Calculation (min) (ACUTE ONLY) 18 min   Pt received in recliner and agreeable to mobility. No complaints during session. Pt fatigued with exertion. Pt to recliner after session with all needs met.    [facility]

## 2023-08-19 NOTE — Progress Notes (Signed)
 Nutrition Follow-up  DOCUMENTATION CODES:   Severe malnutrition in context of chronic illness  INTERVENTION:   - Continue TPN at half rate per surgery      - TPN management per Pharmacy -Daily weights while on TPN  -Ensure MAX Protein po daily, each supplement provides 150 kcal and 30 grams of protein  -Mighty Shake TID with meals, each supplement provides 220 kcals and 6 grams of protein  -Prosource Plus PO TID, each provides 100 kcals and 15g protein    -Multivitamin with minerals daily. -MD ordered vitamin labs.  - Patient found to have low Thiamine , Copper  and zinc  labs.Supplementation continues.   NUTRITION DIAGNOSIS:   Severe Malnutrition related to chronic illness, altered GI function as evidenced by energy intake < or equal to 75% for > or equal to 1 month, percent weight loss.  Ongoing.  GOAL:   Patient will meet greater than or equal to 90% of their needs  Progressing.  MONITOR:   PO intake, Supplement acceptance, Labs (TPN)  ASSESSMENT:   73 y.o. female admitted with FTT, abdominal pain, inablilty to tolerate solids. Plan is for 2 weeks of TPN in prepartion for gastric bypass reversal. 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  Patient in room, embroidering. Pt reports she did not tolerate her lunch or dinner yesterday, tried to consume pot roast and pork, could not eat much without feeling bad. Pt advised to avoid red meat for now and focus on fish and poultry for protein options. Pt drinking Ensure Max but only drank 1/2 of it yesterday. Received a chocolate Mighty Shake last night and drank some of it, likes the taste. Will order with every meal now, prefers these over Magic cups. Recommend pt take Prosource supplements to help meet protein needs.  Pt taking vitamins well with no issue, explained that these should be taken separately.   Admission weight: 181 lbs Current weight: 197 lbs  TPN to continue at half rate, 30  ml/hr, providing 727 kcals and 36g protein.  Medications: Copper , Colace, Lasix , Multivitamin with minerals daily, KLOR-CON , Carafate , Thiamine , Zinc   Labs reviewed.  Diet Order:   Diet Order             Diet regular Room service appropriate? Yes; Fluid consistency: Thin  Diet effective now                   EDUCATION NEEDS:   Education needs have been addressed  Skin:  Skin Assessment: Skin Integrity Issues: Skin Integrity Issues:: Stage II Stage II: sacrum  Last BM:  4/21  Height:   Ht Readings from Last 1 Encounters:  07/27/23 5\' 1"  (1.549 m)    Weight:   Wt Readings from Last 1 Encounters:  08/16/23 89.5 kg    BMI:  Body mass index is 37.28 kg/m.  Estimated Nutritional Needs:   Kcal:  1500-1800  Protein:  75-90g  Fluid:  1.8L/day   Arna Better, MS, RD, LDN Inpatient Clinical Dietitian Contact via Secure chat

## 2023-08-20 MED ORDER — TRAVASOL 10 % IV SOLN
INTRAVENOUS | Status: AC
  Filled 2023-08-20: qty 360

## 2023-08-20 NOTE — Progress Notes (Signed)
 Mobility Specialist - Progress Note   08/20/23 0932  Mobility  Activity Ambulated with assistance in hallway  Level of Assistance Standby assist, set-up cues, supervision of patient - no hands on  Assistive Device Four wheel walker  Distance Ambulated (ft) 100 ft  Activity Response Tolerated fair  Mobility Referral Yes  Mobility visit 1 Mobility  Mobility Specialist Start Time (ACUTE ONLY) 0914  Mobility Specialist Stop Time (ACUTE ONLY) 0931  Mobility Specialist Time Calculation (min) (ACUTE ONLY) 17 min   Pt received in bed and agreeable to mobility. During ambulation, pt stated "I just feel off". Vital checked upon return to room & recorded below. No other complaints during session. RN made aware. Pt to bed after session with all needs met.     Post-mobility: 113 HR, 153/88 (108) BP, 99% SPO2  [employer]

## 2023-08-20 NOTE — Progress Notes (Signed)
 8 Days Post-Op   Subjective/Chief Complaint: Doing well, ate more for dinner last night and did well, oob   Objective: Vital signs in last 24 hours: Temp:  [97.9 F (36.6 C)-99.1 F (37.3 C)] 97.9 F (36.6 C) (04/26 0507) Pulse Rate:  [95-98] 95 (04/26 0507) Resp:  [17-20] 20 (04/26 0507) BP: (148-154)/(71-91) 148/71 (04/26 0507) SpO2:  [98 %-99 %] 98 % (04/26 0507) Last BM Date : 08/14/23  Intake/Output from previous day: 04/25 0701 - 04/26 0700 In: 1259.2 [P.O.:480; I.V.:779.2] Out: -  Intake/Output this shift: No intake/output data recorded.  General nad Pulm effort normal Ab soft nontender nondistended, wounds all clean  Lab Results:  No results for input(s): "WBC", "HGB", "HCT", "PLT" in the last 72 hours. BMET Recent Labs    08/18/23 0216 08/19/23 0257  NA 137 137  K 3.6 3.5  CL 106 106  CO2 23 23  GLUCOSE 89 93  BUN 20 19  CREATININE 0.65 0.86  CALCIUM  9.0 8.5*   PT/INR No results for input(s): "LABPROT", "INR" in the last 72 hours. ABG No results for input(s): "PHART", "HCO3" in the last 72 hours.  Invalid input(s): "PCO2", "PO2"  Studies/Results: No results found.  Anti-infectives: Anti-infectives (From admission, onward)    Start     Dose/Rate Route Frequency Ordered Stop   08/12/23 0730  cefoTEtan  (CEFOTAN ) 1 g in sodium chloride  0.9 % 100 mL IVPB        1 g 200 mL/hr over 30 Minutes Intravenous  Once 08/11/23 1237 08/12/23 0810       Assessment/Plan: POD 8 reversal of bypass, takedown gastrocutaneous fistula -tpn over weekend -monitor po intake which is better   Christine [name] 08/20/2023

## 2023-08-20 NOTE — Plan of Care (Signed)

## 2023-08-20 NOTE — Progress Notes (Signed)
 PHARMACY - TOTAL PARENTERAL NUTRITION CONSULT NOTE   Indication:  Intolerance to enteral feeding   Patient Measurements: Height: 5\' 1"  (154.9 cm) Weight: 89.5 kg (197 lb 5 oz) IBW/kg (Calculated) : 47.8 TPN AdjBW (KG): 56.4 Body mass index is 37.28 kg/m.  Assessment:  72yoF with hx gastric bypass in May 2024, weight at that time 131 kg. Pt has had rapid weight loss, inability to tolerate solid foods, complaints of severe weakness, and problems with gastrostomy tube displacement (Oct - mid-Nov) and ulcer repairs in Nov/Dec 2024. 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 Surgery team recommending reversal of bypass.   Consult for 2 weeks of TPN prior to bypass reversal to optimize nutritional status, patient is s/p OR 4/18. Continues on TPN pending adequate oral intake.   Glucose / Insulin : A1c 6.3 in May 2024; not on insulin   - Not on SSI/CBGs Electrolytes: no labs 4/26, got PO K 40 meq x 1 on 4/25 Renal: Scr <1, BUN WNL Hepatic:  4/24: AST slight bump, alk phose up 208 -Albumin  remains low -TG elevated but improving: 340 (4/14), 331 (4/15), 180 (4/21) Intake / Output; MIVF:  -PO intake: 480 ml po intake recorded -Docusate BID (refusing)  last BM 4/20 -UOP: x 4 occurrences (on lasix  20mg  PO daily) GI Meds: Protonix  BID, Sucralfate  QID  GI Imaging:  - 4/2 CT Abdomen c/w findings of prior gastric bypass without evidence of bowel obstruction  GI Surgeries / Procedures: - 4/18: reversal of gastric bypass, takedown of gastrocutaneous fistula  Central access: 07/27/23 TPN start date: 07/28/23   Nutritional Goals: Goal TPN rate is 65 mL/hr (provides 78 g of protein and 1,575 kcals per day)  RD Assessment: on 4/3 Estimated Needs Total Energy Estimated Needs: 1500-1800 Total Protein Estimated Needs: 75-90g Total Fluid Estimated Needs: 1.8L/day  Current Nutrition:  TPN (half rate) + regular diet (starting 4/21), protein  supplement (Ensure Max)  Supplements: Copper  tablet 2mg  daily, oral MV, thiamine  500mg /d, Zinc  220mg /d  Plan:  -Continue reduced rate TPN @ 30 mL/hr -Per discussion with Dr. Marny Sires, will continue TPN at half rate until clear patient is eating adequately -Electrolytes in TPN: Na 100 mEq/L, K 45 mEq/L, Ca 2 mEq/L, Mg 7 mEq/L, Phos 5 mmol/L (half). Cl:Ac 1:1 -Suppl Copper  tablet 2mg  daily, oral MV, thiamine  500mg /d, Zinc  220mg /d -Monitor TPN labs on Mon/Thurs & PRN   Rubie Corona, Pharm.D Use secure chat for questions 08/20/2023 9:39 AM

## 2023-08-21 MED ORDER — TRAVASOL 10 % IV SOLN
INTRAVENOUS | Status: AC
  Filled 2023-08-21: qty 360

## 2023-08-21 NOTE — Progress Notes (Signed)
 9 Days Post-Op   Subjective/Chief Complaint: Ate ham and cheese sandwich last night, eating much better, no issues   Objective: Vital signs in last 24 hours: Temp:  [98 F (36.7 C)-98.6 F (37 C)] 98.6 F (37 C) (04/27 0624) Pulse Rate:  [93-100] 93 (04/27 0624) Resp:  [15-18] 18 (04/27 0624) BP: (158-176)/(94-124) 159/94 (04/27 0624) SpO2:  [100 %] 100 % (04/27 0624) Last BM Date : 08/14/23  Intake/Output from previous day: 04/26 0701 - 04/27 0700 In: 1121.9 [P.O.:780; I.V.:341.9] Out: -  Intake/Output this shift: No intake/output data recorded.  Ab soft nt/nd approp tender incisions clean  Lab Results:  No results for input(s): "WBC", "HGB", "HCT", "PLT" in the last 72 hours. BMET Recent Labs    08/19/23 0257  NA 137  K 3.5  CL 106  CO2 23  GLUCOSE 93  BUN 19  CREATININE 0.86  CALCIUM  8.5*   PT/INR No results for input(s): "LABPROT", "INR" in the last 72 hours. ABG No results for input(s): "PHART", "HCO3" in the last 72 hours.  Invalid input(s): "PCO2", "PO2"  Studies/Results: No results found.  Anti-infectives: Anti-infectives (From admission, onward)    Start     Dose/Rate Route Frequency Ordered Stop   08/12/23 0730  cefoTEtan  (CEFOTAN ) 1 g in sodium chloride  0.9 % 100 mL IVPB        1 g 200 mL/hr over 30 Minutes Intravenous  Once 08/11/23 1237 08/12/23 0810       Assessment/Plan: POD 9 reversal of bypass, takedown gastrocutaneous fistula -tpn over weekend -monitor po intake which is better -likely can turn tpn off and dc home tomorrow  Enid Harry 08/21/2023

## 2023-08-21 NOTE — Progress Notes (Signed)
 PHARMACY - TOTAL PARENTERAL NUTRITION CONSULT NOTE   Indication:  Intolerance to enteral feeding   Patient Measurements: Height: 5\' 1"  (154.9 cm) Weight: 89.5 kg (197 lb 5 oz) IBW/kg (Calculated) : 47.8 TPN AdjBW (KG): 56.4 Body mass index is 37.28 kg/m.  Assessment:  72yoF with hx gastric bypass in May 2024, weight at that time 131 kg. Pt has had rapid weight loss, inability to tolerate solid foods, complaints of severe weakness, and problems with gastrostomy tube displacement (Oct - mid-Nov) and ulcer repairs in Nov/Dec 2024. 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 Surgery team recommending reversal of bypass.   Consult for 2 weeks of TPN prior to bypass reversal to optimize nutritional status, patient is s/p OR 4/18. Continues on TPN pending adequate oral intake.   Glucose / Insulin : A1c 6.3 in May 2024; not on insulin   - Not on SSI/CBGs Electrolytes: no labs 4/26 or 4/27, got PO K 40 meq x 1 on 4/25 Renal: Scr <1, BUN WNL Hepatic:  4/24: AST slight bump, alk phose up 208 -Albumin  remains low -TG elevated but improving: 340 (4/14), 331 (4/15), 180 (4/21) Intake / Output; MIVF:  -PO intake: 780 ml po intake recorded -Docusate BID (refusing)  last BM 4/20 -UOP: x 3 occurrences (on lasix  20mg  PO daily) GI Meds: Protonix  BID, Sucralfate  QID  GI Imaging:  - 4/2 CT Abdomen c/w findings of prior gastric bypass without evidence of bowel obstruction  GI Surgeries / Procedures: - 4/18: reversal of gastric bypass, takedown of gastrocutaneous fistula  Central access: 07/27/23 TPN start date: 07/28/23   Nutritional Goals: Goal TPN rate is 65 mL/hr (provides 78 g of protein and 1,575 kcals per day)  RD Assessment: on 4/3 Estimated Needs Total Energy Estimated Needs: 1500-1800 Total Protein Estimated Needs: 75-90g Total Fluid Estimated Needs: 1.8L/day  Current Nutrition:  TPN (half rate) + regular diet (starting 4/21), protein  supplement (Ensure Max)  Supplements: Copper  tablet 2mg  daily, oral MV, thiamine  500mg /d, Zinc  220mg /d  Plan:  - per d/w Dr Delane Fear: Continue reduced rate TPN @ 30 mL/hr today -Electrolytes in TPN: Na 100 mEq/L, K 45 mEq/L, Ca 2 mEq/L, Mg 7 mEq/L, Phos 5 mmol/L (half). Cl:Ac 1:1 -Suppl Copper  tablet 2mg  daily, oral MV, thiamine  500mg /d, Zinc  220mg /d -Monitor TPN labs on Mon/Thurs & PRN   Rubie Corona, Pharm.D Use secure chat for questions 08/21/2023 10:41 AM

## 2023-08-21 NOTE — Progress Notes (Signed)
 Patient refused CHG bath, states that it "makes her itc",but did allow us  to give her a regular bath.

## 2023-08-21 NOTE — Progress Notes (Signed)
 Mobility Specialist - Progress Note   08/21/23 1348  Mobility  Activity Ambulated with assistance in hallway;Ambulated with assistance to bathroom  Level of Assistance Modified independent, requires aide device or extra time  Assistive Device Four wheel walker  Distance Ambulated (ft) 100 ft  Activity Response Tolerated well  Mobility Referral Yes  Mobility visit 1 Mobility  Mobility Specialist Start Time (ACUTE ONLY) 1309  Mobility Specialist Stop Time (ACUTE ONLY) 1346  Mobility Specialist Time Calculation (min) (ACUTE ONLY) 37 min   Pt received in recliner requesting assistance to the bathroom for BM. Pt took x1 seated rest break during session. Pt to bed after session with all needs met.    [facility]

## 2023-08-22 ENCOUNTER — Other Ambulatory Visit (HOSPITAL_COMMUNITY): Payer: Self-pay

## 2023-08-22 ENCOUNTER — Other Ambulatory Visit: Payer: Self-pay

## 2023-08-22 LAB — COMPREHENSIVE METABOLIC PANEL WITH GFR
ALT: 17 U/L (ref 0–44)
AST: 22 U/L (ref 15–41)
Albumin: 2.5 g/dL — ABNORMAL LOW (ref 3.5–5.0)
Alkaline Phosphatase: 147 U/L — ABNORMAL HIGH (ref 38–126)
Anion gap: 8 (ref 5–15)
BUN: 17 mg/dL (ref 8–23)
CO2: 22 mmol/L (ref 22–32)
Calcium: 8.8 mg/dL — ABNORMAL LOW (ref 8.9–10.3)
Chloride: 107 mmol/L (ref 98–111)
Creatinine, Ser: 0.74 mg/dL (ref 0.44–1.00)
GFR, Estimated: >60 mL/min (ref >60)
Glucose, Bld: 97 mg/dL (ref 70–99)
Potassium: 3.5 mmol/L (ref 3.5–5.1)
Sodium: 137 mmol/L (ref 135–145)
Total Bilirubin: 0.6 mg/dL (ref 0.0–1.2)
Total Protein: 6 g/dL — ABNORMAL LOW (ref 6.5–8.1)

## 2023-08-22 LAB — PHOSPHORUS: Phosphorus: 3.4 mg/dL (ref 2.5–4.6)

## 2023-08-22 LAB — MAGNESIUM: Magnesium: 1.6 mg/dL — ABNORMAL LOW (ref 1.7–2.4)

## 2023-08-22 LAB — TRIGLYCERIDES: Triglycerides: 297 mg/dL — ABNORMAL HIGH (ref <150)

## 2023-08-22 LAB — PREALBUMIN: Prealbumin: 19 mg/dL (ref 18–38)

## 2023-08-22 MED ORDER — METHOCARBAMOL 750 MG PO TABS
750.0000 mg | ORAL_TABLET | Freq: Four times a day (QID) | ORAL | 0 refills | Status: DC | PRN
  Filled 2023-08-22 – 2023-08-23 (×2): qty 30, 8d supply, fill #0

## 2023-08-22 MED ORDER — ZINC SULFATE 220 (50 ZN) MG PO TABS
220.0000 mg | ORAL_TABLET | Freq: Every day | ORAL | 0 refills | Status: AC
  Filled 2023-08-22 – 2023-08-23 (×2): qty 100, 100d supply, fill #0

## 2023-08-22 MED ORDER — MAGNESIUM SULFATE 2 GM/50ML IV SOLN
2.0000 g | Freq: Once | INTRAVENOUS | Status: AC
Start: 2023-08-22 — End: 2023-08-22
  Administered 2023-08-22: 2 g via INTRAVENOUS
  Filled 2023-08-22: qty 50

## 2023-08-22 MED ORDER — OXYCODONE-ACETAMINOPHEN 5-325 MG PO TABS
1.0000 | ORAL_TABLET | ORAL | 0 refills | Status: DC | PRN
  Filled 2023-08-22 – 2023-08-23 (×2): qty 20, 4d supply, fill #0

## 2023-08-22 MED ORDER — ADULT MULTIVITAMIN W/MINERALS CH
1.0000 | ORAL_TABLET | Freq: Every day | ORAL | 3 refills | Status: DC
  Filled 2023-08-22 – 2023-08-23 (×2): qty 90, 90d supply, fill #0

## 2023-08-22 MED ORDER — COPPER 2 MG PO TABS
2.0000 mg | Freq: Every day | 0 refills | Status: AC
  Filled 2023-08-22: qty 90, 90d supply, fill #0

## 2023-08-22 MED ORDER — VITAMIN B-1 100 MG PO TABS
200.0000 mg | ORAL_TABLET | Freq: Every day | ORAL | 0 refills | Status: AC
  Filled 2023-08-22: qty 180, 90d supply, fill #0
  Filled 2023-08-23: qty 100, 50d supply, fill #0

## 2023-08-22 NOTE — Discharge Instructions (Signed)
 POST OPERATIVE INSTRUCTIONS  Thinking Clearly  The anesthesia may cause you to feel different for 1 or 2 days. Do not drive, drink alcohol, or make any big decisions for at least 2 days.  Nutrition When you wake up, you will be able to drink small amounts of liquid. If you do not feel sick, you can slowly advance your diet to regular foods. Continue to drink lots of fluids, usually about 8 to 10 glasses per day. Eat a high-fiber diet so you don't strain during bowel movements. High-Fiber Foods Foods high in fiber include beans, bran cereals and whole-grain breads, peas, dried fruit (figs, apricots, and dates), raspberries, blackberries, strawberries, sweet corn, broccoli, baked potatoes with skin, plums, pears, apples, greens, and nuts. Activity Slowly increase your activity. Be sure to get up and walk every hour or so to prevent blood clots. No heavy lifting or strenuous activity for 4 weeks following surgery to prevent hernias at your incision sites It is normal to feel tired. You may need more sleep than usual.  Get your rest but make sure to get up and move around frequently to prevent blood clots and pneumonia.  Work and Return to School You can go back to work when you feel well enough. Discuss the timing with your surgeon. You can usually go back to school or work 1 week after an operation. If your work requires heavy lifting or strenuous activity you need to be placed on light duty for 4 weeks following surgery. You can return to gym class, sports or other physical activities 4 weeks after surgery.  Wound Care Always wash your hands before and after touching near your incision site. Do not soak in a bathtub until cleared at your follow up appointment. You may take a shower 24 hours after surgery. A small amount of drainage from the incision is normal. If the drainage is thick and yellow or the site is red, you may have an infection, so call your surgeon. If you have a drain in  one of your incisions, it will be taken out in office when the drainage stops. Steri-Strips will fall off in 7 to 10 days or they will be removed during your first office visit. If you have dermabond glue covering over the incision, allow the glue to flake off on its own. Avoid wearing tight or rough clothing. It may rub your incisions and make it harder for them to heal. Protect the new skin, especially from the sun. The sun can burn and cause darker scarring. Your scar will heal in about 4 to 6 weeks and will become softer and continue to fade over the next year.  The cosmetic appearance of the incisions will improve over the course of the first year after surgery. Sensation around your incision will return in a few weeks or months.  Bowel Movements After intestinal surgery, you may have loose watery stools for several days. If watery diarrhea lasts longer than 3 days, contact your surgeon. Pain medication (narcotics) can cause constipation. Increase the fiber in your diet with high-fiber foods if you are constipated. You can take an over the counter stool softener like Colace to avoid constipation.  Additional over the counter medications can also be used if Colace isn't sufficient (for example, Milk of Magnesia or Miralax).  Pain The amount of pain is different for each person. Some people need only 1 to 3 doses of pain control medication, while others need more. Take alternating doses of tylenol and   ibuprofen around the clock for the first five days following surgery.  This will provide a baseline of pain control and help with inflammation.  Take the narcotic pain medication in addition if needed for severe pain.  Contact Your Surgeon at [phone], if you have: Pain in your right upper abdomen like a gallbladder attack. Pain that will not go away Pain that gets worse A fever of more than 101F (38.3C) Repeated vomiting Swelling, redness, bleeding, or bad-smelling drainage from your  wound site Strong abdominal pain No bowel movement or unable to pass gas for 3 days Watery diarrhea lasting longer than 3 days  Pain Control The goal of pain control is to minimize pain, keep you moving and help you heal. Your surgical team will work with you on your pain plan. Most often a combination of therapies and medications are used to control your pain. You may also be given medication (local anesthetic) at the surgical site. This may help control your pain for several days. Extreme pain puts extra stress on your body at a time when your body needs to focus on healing. Do not wait until your pain has reached a level "10" or is unbearable before telling your doctor or nurse. It is much easier to control pain before it becomes severe. Following a laparoscopic procedure, pain is sometimes felt in the shoulder. This is due to the gas inserted into your abdomen during the procedure. Moving and walking helps to decrease the gas and the right shoulder pain.  Use the guide below for ways to manage your post-operative pain. Learn more by going to facs.org/safepaincontrol.  How Intense Is My Pain Common Therapies to Feel Better       I hardly notice my pain, and it does not interfere with my activities.  I notice my pain and it distracts me, but I can still do activities (sitting up, walking, standing).  Non-Medication Therapies  Ice (in a bag, applied over clothing at the surgical site), elevation, rest, meditation, massage, distraction (music, TV, play) walking and mild exercise Splinting the abdomen with pillows +  Non-Opioid Medications Acetaminophen (Tylenol) Non-steroidal anti-inflammatory drugs (NSAIDS) Aspirin, Ibuprofen (Motrin, Advil) Naproxen (Aleve) Take these as needed, when you feel pain. Both acetaminophen and NSAIDs help to decrease pain and swelling (inflammation).      My pain is hard to ignore and is more noticeable even when I rest.  My pain interferes with  my usual activities.  Non-Medication Therapies  +  Non-Opioid medications  Take on a regular schedule (around-the-clock) instead of as needed. (For example, Tylenol every 6 hours at 9:00 am, 3:00 pm, 9:00 pm, 3:00 am and Motrin every 6 hours at 12:00 am, 6:00 am, 12:00 pm, 6:00 pm)         I am focused on my pain, and I am not doing my daily activities.  I am groaning in pain, and I cannot sleep. I am unable to do anything.  My pain is as bad as it could be, and nothing else matters.  Non-Medication Therapies  +  Around-the-Clock Non-Opioid Medications  +  Short-acting opioids  Opioids should be used with other medications to manage severe pain. Opioids block pain and give a feeling of euphoria (feel high). Addiction, a serious side effect of opioids, is rare with short-term (a few days) use.  Examples of short-acting opioids include: Tramadol (Ultram), Hydrocodone (Norco, Vicodin), Hydromorphone (Dilaudid), Oxycodone (Oxycontin)     The above directions have been adapted from the   American College [employer] Surgical Patient Education Program.  Please refer to the ACS website if needed: [email or web address]://[email or web address].org/-/media/files/education/[email or web address].[email or web address].   Koree Staheli, MD [facility] [address], [address], [place], [place]  [address] ?  [address] [address], [place], [place]   [address] [phone] ? [phone] ? FAX [phone] Web site: [email or web address]  

## 2023-08-22 NOTE — Progress Notes (Signed)
 PHARMACY - TOTAL PARENTERAL NUTRITION CONSULT NOTE   Indication:  Intolerance to enteral feeding   Patient Measurements: Height: 5\' 1"  (154.9 cm) Weight: 89.5 kg (197 lb 5 oz) IBW/kg (Calculated) : 47.8 TPN AdjBW (KG): 56.4 Body mass index is 37.28 kg/m.  Assessment:  72yoF with hx gastric bypass in May 2024, weight at that time 131 kg. Pt has had rapid weight loss, inability to tolerate solid foods, complaints of severe weakness, and problems with gastrostomy tube displacement (Oct - mid-Nov) and ulcer repairs in Nov/Dec 2024. 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 Surgery team recommending reversal of bypass.   Consult for 2 weeks of TPN prior to bypass reversal to optimize nutritional status, patient is s/p OR 4/18. Continues on TPN pending adequate oral intake.   Glucose / Insulin : A1c 6.3 in May 2024; not on insulin   - Not on SSI/CBGs Electrolytes: no labs 4/26 or 4/27, got PO K 40 meq x 1 on 4/25 Renal: Scr <1, BUN WNL Hepatic:  4/24: AST slight bump, alk phose up 208 -Albumin  remains low -TG elevated but improving: 340 (4/14), 331 (4/15), 180 (4/21) Intake / Output; MIVF:  -PO intake: 780 ml po intake recorded -Docusate BID (refusing)  last BM 4/20 -UOP: x 3 occurrences (on lasix  20mg  PO daily) GI Meds: Protonix  BID, Sucralfate  QID GI Imaging:  - 4/2 CT Abdomen c/w findings of prior gastric bypass without evidence of bowel obstruction GI Surgeries / Procedures: - 4/18: reversal of gastric bypass, takedown of gastrocutaneous fistula  Central access: 07/27/23 TPN start date: 07/28/23   Nutritional Goals: Goal TPN rate is 65 mL/hr (provides 78 g of protein and 1,575 kcals per day)  RD Assessment: on 4/3 Estimated Needs Total Energy Estimated Needs: 1500-1800 Total Protein Estimated Needs: 75-90g Total Fluid Estimated Needs: 1.8L/day  Current Nutrition:  TPN (half rate) + regular diet (starting 4/21), protein  supplement (Ensure Max)  Supplements: Copper  tablet 2mg  daily, oral MV, thiamine  500mg /d, Zinc  220mg /d  Plan:  Per discussion with [facility], OK to stop TPN today after finishing current bag Will give 2g Mg Sulfate IV x 1 for Mg level 1.6  Tera Fellows, PharmD, BCPS [phone] 08/22/2023, 9:15 AM

## 2023-08-22 NOTE — Progress Notes (Signed)
 Occupational Therapy Treatment and Discharge Patient Details Name: Christine [name] MRN: [id number] DOB: 1950/06/28 Today's Date: 08/22/2023   History of present illness 73 y.o. female admitted with FTT, abdominal pain, inablilty to tolerate solids. Plan is for 2 weeks of TPN in prepartion for gastric bypass reversal. 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  Pt with gastric bypass reversal 08/11/23   OT comments  Patient was seen for skilled OT session this afternoon. Patient was completing toileting with NT commode level and using rollator upon OT arrival. Patient reports no pain this visit. OT reinforced Energy Conservation Techniques and referred to Handout issued and placed in patient belongings bag to take home. Reinforced all B UE therex including grasp ball, tband, cane exercises and breathing integration with + teach back. Patient now relatively modified independent with BADL's and transfers with rollator and continue to recommend HHOT upon discharge from hospital. No further OT needs Acute level with OT signing off at this time.       If plan is discharge home, recommend the following:  Assistance with cooking/housework;Assist for transportation;Help with stairs or ramp for entrance   Equipment Recommendations  None recommended by OT       Precautions / Restrictions Precautions Precautions: Fall Recall of Precautions/Restrictions: Intact Restrictions Weight Bearing Restrictions Per Provider Order: No       Mobility Bed Mobility Overal bed mobility: Modified Independent                  Transfers Overall transfer level: Modified independent                       Balance Overall balance assessment: Needs assistance         Standing balance support: During functional activity, Bilateral upper extremity supported, Reliant on assistive device for balance Standing balance-Leahy Scale: Fair Standing balance  comment: rollator to ambulate ; ADLs without UE support                           ADL either performed or assessed with clinical judgement   ADL Overall ADL's : Needs assistance/impaired             Lower Body Bathing: Sitting/lateral leans;Bed level;Modified independent       Lower Body Dressing: Sitting/lateral leans;Sit to/from stand;Modified independent   Toilet Transfer: Rollator (4 wheels);Supervision/safety           Functional mobility during ADLs: Modified independent General ADL Comments: Completed ECT Handout education this session    Extremity/Trunk Assessment Upper Extremity Assessment Upper Extremity Assessment: Generalized weakness;Right hand dominant   Lower Extremity Assessment Lower Extremity Assessment: Generalized weakness                 Communication Communication Communication: No apparent difficulties   Cognition Arousal: Alert Behavior During Therapy: WFL for tasks assessed/performed                                 Following commands: Intact        Cueing   Cueing Techniques: Verbal cues  Exercises General Exercises - Upper Extremity Shoulder Flexion: 10 reps, Seated Elbow Extension: 10 reps, Theraband Digit Composite Flexion: Other (comment), Strengthening, Right, Left    Shoulder Instructions       General Comments no pain reported throughout sesison    Pertinent Vitals/ Pain  Pain Assessment Pain Assessment: Faces Faces Pain Scale: No hurt   Progress Toward Goals  OT Goals(current goals can now be found in the care plan section)  Progress towards OT goals: Goals met/education completed, patient discharged from OT      AM-PAC OT "6 Clicks" Daily Activity     Outcome Measure   Help from another person eating meals?: None Help from another person taking care of personal grooming?: None Help from another person toileting, which includes using toliet, bedpan, or urinal?: None Help  from another person bathing (including washing, rinsing, drying)?: None Help from another person to put on and taking off regular upper body clothing?: None Help from another person to put on and taking off regular lower body clothing?: None 6 Click Score: 24    End of Session Equipment Utilized During Treatment: Gait belt;Rollator (4 wheels)      Activity Tolerance Patient tolerated treatment well   Patient Left in bed;with call bell/phone within reach;with bed alarm set   Nurse Communication Mobility status        Time: 1510-1540 OT Time Calculation (min): 30 min  Charges: OT General Charges $OT Visit: 1 Visit OT Treatments $Therapeutic Exercise: 23-37 mins  Theoden Mauch OT/L Acute Rehabilitation Department  [phone]  08/22/2023, 4:40 PM

## 2023-08-22 NOTE — Progress Notes (Addendum)
 [facility] Liaison Note  08/22/2023  Jestina Timmer [facility] 14-Sep-1950 [id number]  Location: RN Hospital Liaison screened the patient remotely at [facility].  Insurance: [employer] Advantage   Joci Didonato is a 73 y.o. female who is a Primary Care Patient of Gwenette Lennox, Shellie Dials, DO [facility] at [facility]. The patient was screened for  day readmission hospitalization with noted extreme risk score for unplanned readmission risk with 2 IP in 6 months.  Patient is currently active with Care Management for chronic disease management services.  Patient has been engaged by a  Tourist information centre manager.  Our community based plan of care has focused on disease management and community resource support.   Patient will receive a post hospital call and will be evaluated for assessments and disease process education.   Review of patient's electronic medical record reveals patient was admitted for Failure To Thrive. Pt will discharge home with HHPT/OT with [facility]. Liaison will collaborate with the involved VBCI team on pt's discharge disposition,   VBCI Care Management/Population Health does not replace or interfere with any arrangements made by the Inpatient Transition of Care team.   For questions contact:   Lilla Reichert, RN, BSN Hospital Liaison    [facility], Population Health Office Hours MTWF  8:00 am-6:00 pm Direct Dial: [phone] mobile [email or web address]

## 2023-08-22 NOTE — Progress Notes (Signed)
 10 Days Post-Op   Subjective/Chief Complaint: Doing well post op   Objective: Vital signs in last 24 hours: Temp:  [97.8 F (36.6 C)-99 F (37.2 C)] 99 F (37.2 C) (04/28 1359) Pulse Rate:  [93-108] 94 (04/28 1359) Resp:  [18] 18 (04/28 1359) BP: (128-157)/(61-89) 138/77 (04/28 1359) SpO2:  [96 %-100 %] 96 % (04/28 1359) Last BM Date : 08/21/23  Intake/Output from previous day: 04/27 0701 - 04/28 0700 In: 2134.2 [P.O.:1080; I.V.:1054.2] Out: -  Intake/Output this shift: Total I/O In: 120 [P.O.:120] Out: -   Ab soft nt/nd approp tender incisions clean  Lab Results:  No results for input(s): "WBC", "HGB", "HCT", "PLT" in the last 72 hours. BMET Recent Labs    08/22/23 0605  NA 137  K 3.5  CL 107  CO2 22  GLUCOSE 97  BUN 17  CREATININE 0.74  CALCIUM  8.8*   PT/INR No results for input(s): "LABPROT", "INR" in the last 72 hours. ABG No results for input(s): "PHART", "HCO3" in the last 72 hours.  Invalid input(s): "PCO2", "PO2"  Studies/Results: No results found.  Anti-infectives: Anti-infectives (From admission, onward)    Start     Dose/Rate Route Frequency Ordered Stop   08/12/23 0730  cefoTEtan  (CEFOTAN ) 1 g in sodium chloride  0.9 % 100 mL IVPB        1 g 200 mL/hr over 30 Minutes Intravenous  Once 08/11/23 1237 08/12/23 0810       Assessment/Plan: POD 10 reversal of bypass, takedown gastrocutaneous fistula Stop TPN Home first thing in morning    Christine [name] [facility] 08/22/2023

## 2023-08-23 ENCOUNTER — Other Ambulatory Visit: Payer: Self-pay

## 2023-08-23 ENCOUNTER — Other Ambulatory Visit (HOSPITAL_COMMUNITY): Payer: Self-pay

## 2023-08-23 NOTE — Progress Notes (Signed)
 Discharge instructions given to patient questions asked and answered. Sylvia Everts RN

## 2023-08-23 NOTE — Discharge Summary (Signed)
 Patient ID: Christine [name] [id number] y.o. 1950/12/26  07/27/2023  Discharge date and time: 08/23/2023  Admitting Physician: Avon Boers [employer]  Discharge Physician: Avon Boers Pihu Basil  Admission Diagnoses: Failure to thrive in adult [R62.7] Patient Active Problem List   Diagnosis Date Noted   History of gastrojejunal ulcer 08/07/2023   Protein-calorie malnutrition, severe 07/30/2023   DVT (deep venous thrombosis) (HCC) 04/10/2023   Hypotension 03/22/2023   Metabolic acidosis 03/22/2023   Bowel perforation (HCC) 03/21/2023   Pressure injury of skin 01/29/2023   Esophageal dysphagia 01/15/2023   Abnormal esophagram 01/15/2023   Hypoglycemia 01/15/2023   Hypernatremia 01/08/2023   Severe dehydration 01/08/2023   Altered mental status 01/08/2023   Failure to thrive in adult 01/08/2023   AKI (acute kidney injury) (HCC) 01/08/2023   Gallbladder dilatation 01/08/2023   SIRS (systemic inflammatory response syndrome) (HCC) 11/09/2022   Sepsis (HCC) 11/09/2022   Acute metabolic encephalopathy 11/09/2022   Renal insufficiency 11/09/2022   Hypokalemia 11/09/2022   Normocytic anemia 11/09/2022   Morbid obesity with BMI of 50.0-59.9, adult (HCC) 09/14/2022   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   Intermittent exophthalmos of right eye 06/09/2022   Gastric polyp 04/08/2022   Hyperlipidemia 05/20/2021   Lumbar radiculopathy 12/24/2020   Trigger little finger of left hand 12/16/2020   Medial epicondylitis of elbow, left 12/16/2020   Memory difficulties 12/11/2020   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   Globus sensation 11/15/2019   Gastroesophageal reflux disease 11/15/2019   Laryngopharyngeal reflux (LPR) 11/15/2019   Throat clearing 10/05/2019   Gout 08/31/2019   Phlebitis 08/31/2019   Prediabetes 08/31/2019   OA (osteoarthritis) of knee 05/17/2019   Acute pain of right knee 05/17/2019   Abdominal pain 03/31/2019   Morbid obesity  (HCC) 03/29/2018   GAD (generalized anxiety disorder) 02/27/2018   Mixed incontinence urge and stress 02/27/2018   OSA on CPAP 06/16/2017   Acute appendicitis 05/06/2016   Gastro-esophageal reflux disease with esophagitis 07/26/2011   Essential hypertension 07/26/2011   Osteoarthrosis 10/16/2009   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, including shoulder 08/22/2008   Pre-operative cardiovascular examination 08/22/2008   Anxiety 04/27/2003     Discharge Diagnoses:  Patient Active Problem List   Diagnosis Date Noted   History of gastrojejunal ulcer 08/07/2023   Protein-calorie malnutrition, severe 07/30/2023   DVT (deep venous thrombosis) (HCC) 04/10/2023   Hypotension 03/22/2023   Metabolic acidosis 03/22/2023   Bowel perforation (HCC) 03/21/2023   Pressure injury of skin 01/29/2023   Esophageal dysphagia 01/15/2023   Abnormal esophagram 01/15/2023   Hypoglycemia 01/15/2023   Hypernatremia 01/08/2023   Severe dehydration 01/08/2023   Altered mental status 01/08/2023   Failure to thrive in adult 01/08/2023   AKI (acute kidney injury) (HCC) 01/08/2023   Gallbladder dilatation 01/08/2023   SIRS (systemic inflammatory response syndrome) (HCC) 11/09/2022   Sepsis (HCC) 11/09/2022   Acute metabolic encephalopathy 11/09/2022   Renal insufficiency 11/09/2022   Hypokalemia 11/09/2022   Normocytic anemia 11/09/2022   Morbid obesity with BMI of 50.0-59.9, adult (HCC) 09/14/2022   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   Intermittent exophthalmos of right eye 06/09/2022   Gastric polyp 04/08/2022   Hyperlipidemia 05/20/2021   Lumbar radiculopathy 12/24/2020   Trigger little finger of left hand 12/16/2020   Medial epicondylitis of elbow, left 12/16/2020   Memory difficulties 12/11/2020   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   Globus sensation 11/15/2019   Gastroesophageal reflux disease  11/15/2019   Laryngopharyngeal  reflux (LPR) 11/15/2019   Throat clearing 10/05/2019   Gout 08/31/2019   Phlebitis 08/31/2019   Prediabetes 08/31/2019   OA (osteoarthritis) of knee 05/17/2019   Acute pain of right knee 05/17/2019   Abdominal pain 03/31/2019   Morbid obesity (HCC) 03/29/2018   GAD (generalized anxiety disorder) 02/27/2018   Mixed incontinence urge and stress 02/27/2018   OSA on CPAP 06/16/2017   Acute appendicitis 05/06/2016   Gastro-esophageal reflux disease with esophagitis 07/26/2011   Essential hypertension 07/26/2011   Osteoarthrosis 10/16/2009   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  Reversal of gastric bypass.  Weaning of TPN and advancing diet with nutrition supplements  Consults: None  Significant Diagnostic Studies: CT  Treatments: Surgery, as above  Disposition: Home  Patient Instructions:  Allergies as of 08/23/2023       Reactions   Nsaids Other (See Comments)   ulcers   Prednisone Swelling   Legs swell    Tape Hives   Sulfa Antibiotics Rash        Medication List     TAKE these medications    colchicine  0.6 MG tablet Take 1 tablet (0.6 mg total) by mouth daily.   copper  tablet Take 1 tablet (2 mg total) by mouth daily.   doxycycline  100 MG tablet Commonly known as: ADOXA Take 100 mg by mouth 2 (two) times daily.   Eliquis  5 MG Tabs tablet Generic drug: apixaban  Place 1 tablet (5 mg total) into feeding tube 2 (two) times daily.   furosemide  20 MG tablet Commonly known as: LASIX  Take 1 tablet (20 mg total) by mouth daily.   lidocaine  2 % solution Commonly known as:  XYLOCAINE  Use as directed 5-10 mLs in the mouth or throat every 6 (six) hours as needed for mouth pain.   meclizine  12.5 MG tablet Commonly known as: ANTIVERT  Take 1 tablet (12.5 mg total) by mouth 3 (three) times daily as needed for dizziness. What changed: when to take this   methocarbamol  750 MG tablet Commonly known as: Robaxin -750 Take 1 tablet (750 mg total) by mouth every 6 (six) hours as needed for muscle spasms.   multivitamin with minerals Tabs tablet Place 1 tablet into feeding tube daily. What changed:  how to take this when to take this   multivitamin with minerals Tabs tablet Take 1 tablet by mouth daily. What changed: You were already taking a medication with the same name, and this prescription was added. Make sure you understand how and when to take each.   ondansetron  4 MG tablet Commonly known as: ZOFRAN  Place 1 tablet (4 mg total) into feeding tube every 6 (six) hours as needed for nausea.   oxyCODONE -acetaminophen  5-325 MG tablet Commonly known as: Percocet Take 1 tablet by mouth every 4 (four) hours as needed.   pantoprazole  40 MG tablet Commonly known as: Protonix  Take 1 tablet (40 mg total) by mouth daily.   polyethylene glycol powder 17 GM/SCOOP powder Commonly known as: MiraLax  Take 17 g by mouth 2 (two) times daily as needed for mild constipation.   thiamine  100 MG tablet Commonly known as: Vitamin B-1 Take 2 tablets (200 mg total) by mouth  daily.   zinc  sulfate (50mg  elemental zinc ) 220 (50 Zn) MG capsule Take 1 capsule (220 mg total) by mouth daily.        Activity: no heavy lifting for 4 weeks Diet: regular diet Wound Care: keep wound clean and dry  Follow-up:  With Dr. Marny Sires in 4 weeks.  Signed: Avon Boers [facility] [place]   08/23/2023, 9:37 AM

## 2023-08-23 NOTE — Progress Notes (Signed)
 PICC Removal Note: PICC line removed from RUE. PICC catheter tip visualized and intact. Pressure dressing applied. No redness, ecchymosis, edema, swelling, or drainage noted at site. Instructions provided on post PICC discharge care, including followup notification instructions.  Bedrest until 1045.

## 2023-08-23 NOTE — Care Management Important Message (Signed)
 Important Message  Patient Details IM Letter given to the Patient Name: Christine [name] MRN: [id number] Date of Birth: Jan 29, 1951   Important Message Given:  Yes - Medicare IM     Curtiss Dowdy 08/23/2023, 10:19 AM

## 2023-08-23 NOTE — Progress Notes (Signed)
 Discharge meds delivered to patient at bedside [facility]

## 2023-08-23 NOTE — TOC Transition Note (Signed)
 Transition of Care [facility]) - Discharge Note   Patient Details  Name: Christine [name] MRN: [id number] Date of Birth: 06/17/1950  [facility] CM/SW Contact:  Bari Leys, RN Phone Number: 08/23/2023, 10:50 AM   Clinical Narrative: DC to [facility] orders with [facility] PT/OT arranged with [facility], Taxi Voucher provided for transportation. No further TOC needs identified.      Final next level of care: Home w Home Health Services Barriers to Discharge: Barriers Resolved   Patient Goals and CMS Choice Patient states their goals for this hospitalization and ongoing recovery are:: return home CMS Medicare.gov Compare Post Acute Care list provided to:: Patient Choice offered to / list presented to : Patient [place] ownership interest in [facility].provided to:: Patient    Discharge Placement                       Discharge Plan and Services Additional resources added to the After Visit Summary for                            [facility] Arranged: PT [facility] Agency: [facility] Date [facility] Agency Contacted: 07/28/23 Time HH Agency Contacted: [phone] Representative spoke with at [facility] Agency: Imelda Man  Social Drivers of Health (SDOH) Interventions SDOH Screenings   Food Insecurity: Food Insecurity Present (08/09/2023)  Housing: Low Risk  (08/09/2023)  Transportation Needs: Unmet Transportation Needs (08/09/2023)  Utilities: Not At Risk (08/09/2023)  Alcohol Screen: Low Risk  (06/24/2023)  Depression (PHQ2-9): Medium Risk (08/09/2023)  Financial Resource Strain: Low Risk  (06/24/2023)  Physical Activity: Insufficiently Active (06/24/2023)  Social Connections: Moderately Isolated (07/27/2023)  Stress: Stress Concern Present (08/09/2023)  Tobacco Use: Medium Risk (08/12/2023)  Health Literacy: Medium Risk (12/01/2022)   Received from [facility]     Readmission Risk Interventions    08/23/2023   10:48 AM 07/28/2023   12:55 PM 04/22/2023    2:07 PM  Readmission  Risk Prevention Plan  Transportation Screening Complete Complete Complete  Medication Review Oceanographer) Complete Complete Complete  PCP or Specialist appointment within 3-5 days of discharge Complete Complete   HRI or Home Care Consult Complete Complete Complete  SW Recovery Care/Counseling Consult Complete Complete Complete  Palliative Care Screening Not Applicable Not Applicable Not Applicable  Skilled Nursing Facility Not Applicable Not Applicable Complete

## 2023-08-23 NOTE — Progress Notes (Signed)
 Mobility Specialist - Progress Note   08/23/23 0920  Mobility  Activity Ambulated with assistance in hallway;Ambulated with assistance to bathroom (Simultaneous filing. User may not have seen previous data.)  Level of Assistance Modified independent, requires aide device or extra time  Assistive Device Four wheel walker  Distance Ambulated (ft) 160 ft  Activity Response Tolerated well  Mobility Referral Yes  Mobility visit 1 Mobility  Mobility Specialist Start Time (ACUTE ONLY) [id number]  Mobility Specialist Stop Time (ACUTE ONLY) 0919  Mobility Specialist Time Calculation (min) (ACUTE ONLY) 21 min   Pt received in bed and agreeable to mobility. No complaints during session. Upon return, pt requested assistance to [facility] for BM. Pt to bed after session with all needs met.     [facility]

## 2023-08-24 ENCOUNTER — Telehealth: Payer: Self-pay | Admitting: *Deleted

## 2023-08-24 NOTE — Transitions of Care (Post Inpatient/ED Visit) (Signed)
   08/24/2023  Name: Christine [name] MRN: [id number] DOB: [birth year]  Today's TOC FU Call Status: Today's TOC FU Call Status:: Successful TOC FU Call Completed TOC FU Call Complete Date: 08/24/23 Patient's Name and Date of Birth confirmed.  Transition Care Management Follow-up Telephone Call Date of Discharge: 08/23/23 Discharge Facility: [facility]) Type of Discharge: Inpatient Admission Primary Inpatient Discharge Diagnosis:: Failure to thrive in adult How have you been since you were released from the hospital?: Better (I feel not to bad/ a litttle tired) Any questions or concerns?: Yes Patient Questions/Concerns:: Patient stated she has a bandage over a wound that did have some drainage but is is better affter she cleaned it and changed the dressing. Patient Questions/Concerns Addressed: Other: (Patient is going to make appt with pcp so he can observe in between the surgeon visit)  Items Reviewed: Did you receive and understand the discharge instructions provided?: Yes Medications obtained,verified, and reconciled?: Partial Review Completed Reason for Partial Mediation Review: Patient didn't want to go over full list. We went over the new medications that was ordered. Any new allergies since your discharge?: No Dietary orders reviewed?: No Do you have support at home?: Yes Name of Support/Comfort Primary Source: Shelagh Derrick sister and friends  Medications Reviewed Today: Medications Reviewed Today   Medications were not reviewed in this encounter     Home Care and Equipment/Supplies: Were Home Health Services Ordered?: Yes Name of Home Health Agency:: [facility] Has Agency set up a time to come to your home?: No EMR reviewed for Home Health Orders: Orders present/patient has not received call (refer to CM for follow-up) (Patient is going to give them one day and then call.) Any new equipment or medical supplies ordered?: NA  Functional Questionnaire: Do you need  assistance with bathing/showering or dressing?: No Do you need assistance with meal preparation?: No Do you need assistance with eating?: No Do you have difficulty maintaining continence: No Do you need assistance with getting out of bed/getting out of a chair/moving?: No Do you have difficulty managing or taking your medications?: No  Follow up appointments reviewed: PCP Follow-up appointment confirmed?: No MD Provider Line [phone] Given: Yes (Per patient she is going to make her appointment) Specialist Hospital Follow-up appointment confirmed?: Yes Date of Specialist follow-up appointment?: 09/09/23 Follow-Up Specialty Provider:: Dr Marny Sires Do you need transportation to your follow-up appointment?: No Do you understand care options if your condition(s) worsen?: Yes-patient verbalized understanding  SDOH Interventions Today    Flowsheet Row Most Recent Value  SDOH Interventions   Food Insecurity Interventions Intervention Not Indicated, Other (Comment)  [Social worker [name] Angel is monitoring food needs]  Housing Interventions Intervention Not Indicated  Transportation Interventions Other (Comment)  [Social workeris monitoring the patient needs. She is using a service to go to the Dr she stated]  Utilities Interventions Intervention Not Indicated  Depression Interventions/Treatment  Counseling       Una Ganser BSN RN [facility] Health Care Management Coordinator [email or web address] Direct Dial: [phone]  Fax: [phone] Website: [email or web address]

## 2023-08-29 ENCOUNTER — Other Ambulatory Visit: Payer: Self-pay | Admitting: Licensed Clinical Social Worker

## 2023-08-29 DIAGNOSIS — R627 Adult failure to thrive: Secondary | ICD-10-CM | POA: Diagnosis not present

## 2023-08-29 DIAGNOSIS — E43 Unspecified severe protein-calorie malnutrition: Secondary | ICD-10-CM | POA: Diagnosis not present

## 2023-08-29 DIAGNOSIS — I10 Essential (primary) hypertension: Secondary | ICD-10-CM | POA: Diagnosis not present

## 2023-08-29 DIAGNOSIS — Z792 Long term (current) use of antibiotics: Secondary | ICD-10-CM | POA: Diagnosis not present

## 2023-08-29 DIAGNOSIS — H16049 Marginal corneal ulcer, unspecified eye: Secondary | ICD-10-CM | POA: Diagnosis not present

## 2023-08-29 DIAGNOSIS — F411 Generalized anxiety disorder: Secondary | ICD-10-CM | POA: Diagnosis not present

## 2023-08-29 DIAGNOSIS — Z79899 Other long term (current) drug therapy: Secondary | ICD-10-CM | POA: Diagnosis not present

## 2023-08-29 DIAGNOSIS — K219 Gastro-esophageal reflux disease without esophagitis: Secondary | ICD-10-CM | POA: Diagnosis not present

## 2023-08-29 DIAGNOSIS — M5416 Radiculopathy, lumbar region: Secondary | ICD-10-CM | POA: Diagnosis not present

## 2023-08-29 DIAGNOSIS — E119 Type 2 diabetes mellitus without complications: Secondary | ICD-10-CM | POA: Diagnosis not present

## 2023-08-29 DIAGNOSIS — D649 Anemia, unspecified: Secondary | ICD-10-CM | POA: Diagnosis not present

## 2023-08-29 DIAGNOSIS — M109 Gout, unspecified: Secondary | ICD-10-CM | POA: Diagnosis not present

## 2023-08-29 DIAGNOSIS — M179 Osteoarthritis of knee, unspecified: Secondary | ICD-10-CM | POA: Diagnosis not present

## 2023-08-29 DIAGNOSIS — G4733 Obstructive sleep apnea (adult) (pediatric): Secondary | ICD-10-CM | POA: Diagnosis not present

## 2023-08-29 DIAGNOSIS — R1314 Dysphagia, pharyngoesophageal phase: Secondary | ICD-10-CM | POA: Diagnosis not present

## 2023-08-29 DIAGNOSIS — E785 Hyperlipidemia, unspecified: Secondary | ICD-10-CM | POA: Diagnosis not present

## 2023-08-29 DIAGNOSIS — Z6835 Body mass index (BMI) 35.0-35.9, adult: Secondary | ICD-10-CM | POA: Diagnosis not present

## 2023-08-29 DIAGNOSIS — F32A Depression, unspecified: Secondary | ICD-10-CM | POA: Diagnosis not present

## 2023-08-29 DIAGNOSIS — Z7901 Long term (current) use of anticoagulants: Secondary | ICD-10-CM | POA: Diagnosis not present

## 2023-08-29 DIAGNOSIS — Z48815 Encounter for surgical aftercare following surgery on the digestive system: Secondary | ICD-10-CM | POA: Diagnosis not present

## 2023-08-29 DIAGNOSIS — Z86718 Personal history of other venous thrombosis and embolism: Secondary | ICD-10-CM | POA: Diagnosis not present

## 2023-08-29 NOTE — Patient Instructions (Signed)
 Visit Information  Thank you for taking time to visit with me today. Please don't hesitate to contact me if I can be of assistance to you before our next scheduled appointment.   Client has LCSW name and phone number and has been encouraged to call LCSW as needed for SW support  Please call the care guide team at [phone] if you need to cancel or reschedule your appointment.   Following is a copy of your care plan:   Goals Addressed             This Visit's Progress    VBCI Social Work Care Plan       Problems:   Pain issues             Mobility challenges:  uses a cane or a walker as needed to ambulate             Transportation needs             Low appetite occasionally             Family members reside in [place]  (another state); client is more dependent on friends locally for support  CSW Clinical Goal(s):   Over the next 30  days the Patient will attend all scheduled medical appointments as evidenced by patient report and care team review of appointment completion in electronic MEDICAL RECORD NUMBER  .              Over next 30 days patient will use coping skills to manage depression or anxiety symptoms AEB patient report of reduction in anxiety or depression symptoms  Interventions:  Discussed medication procurement of client             Discussed client upcoming medical appointments with surgeon and with PCP             Discussed client support with PCP             Discussed vision issues; client wears glasses to help with vision            Discussed pain issues of client. Client takes pain medication as prescribed             Discussed transport needs. Client uses local transport services to transport her to and from medical appointments            Discussed coping skills of client. She likes to play computer games, likes to sew, likes to talk via phone with friends                  Encouraged client to call LCSW as needed for SW support  Patient Goals/Self-Care  Activities:  Attend all scheduled medical appointments             Take medications as prescribed              Use coping skills to manage anxiety or depression issues              Allow time for rest and relaxation.              Allow time to complete ADLs             Schedule transport assistance as needed             Call LCSW as needed for SW support  Plan:   Client has LCSW name and phone number and has been encouraged to call LCSW  as needed for  SW support at [phone]        Please go to [facility] [address], [place] [phone]) if you are experiencing a Mental Health or Behavioral Health Crisis or need someone to talk to.  The patient verbalized understanding of instructions, educational materials, and care plan provided today and DECLINED offer to receive copy of patient instructions, educational materials, and care plan.   Patient was provided contact information for Care Team.  LCSW gave client LCSW name and phone number and encouraged client to call LCSW as needed for SW support at [phone]   [name] Angel  MSW, LCSW [place] Licensed Clinical Social Worker Direct Dial:  [phone] Fax:  [phone] Website:  [email or web address]

## 2023-08-29 NOTE — Patient Outreach (Signed)
 Complex Care Management   Visit Note  08/29/2023  Name:  Christine [name] MRN: [id number] DOB: [birth year]  Situation: Referral received for Complex Care Management related to  client management of anxiety issues and medical needs  I obtained verbal consent from Patient.  Visit completed with patient  on the phone  Background:   Past Medical History:  Diagnosis Date   Allergy    dust, pollen, sulfa, prednisone   Anemia    Anxiety    Arthritis    "knees" (04/26/2016)   Colon polyps    benign per pt   Coronary artery disease    mild per 2015 cath in [place]  (OM1 30%, RCA 30%)   GERD (gastroesophageal reflux disease)    Gout    History of hiatal hernia    Hyperlipidemia 05/20/2021   Hypertension    Migraine    "none since early /2017" (05/06/2016)   Obesity    OSA on CPAP    uses CPAP   Pre-diabetes    Pre-operative cardiovascular examination 08/22/2008   Renal insufficiency 11/09/2022   Vasculitis (HCC)    Bilateral    Assessment: Patient Reported Symptoms:  Cognitive Cognitive Status: Alert and oriented to person, place, and time   Health Facilitated by: Rest, Healthy diet  Neurological Neurological Review of Symptoms: Dizziness, Weakness Neurological Management Strategies: Adequate rest Some Anxiety issues  HEENT HEENT Symptoms Reported: No symptoms reported HEENT Management Strategies: Adequate rest    Cardiovascular Cardiovascular Symptoms Reported: HTN  Cardiovascular Management Strategies: Adequate rest  Respiratory Respiratory Symptoms Reported: No symptoms reported    Endocrine    No issues noted  Gastrointestinal    Recent surgery (reversal of Bariatric surgery)    Genitourinary    Mixed incontinence  Integumentary    Unable to assess  Musculoskeletal    Weakness; uses cane or walker as needed to walk  Fall risk    Psychosocial    Family lives in another state; she is more dependent on local friends for help. Needs transport support. May  needs some food support. Anxiety issues. Pain issues   Quality of Family Relationships: involved, supportive Do you feel physically threatened by others?: No      08/29/2023    9:56 AM  Depression screen PHQ 2/9  Decreased Interest 1  Down, Depressed, Hopeless 1  PHQ - 2 Score 2  Altered sleeping 0  Tired, decreased energy 1  Change in appetite 1  Feeling bad or failure about yourself  1  Trouble concentrating 1  Moving slowly or fidgety/restless 1  Suicidal thoughts 0  PHQ-9 Score 7  Difficult doing work/chores Somewhat difficult    Vitals:   Within normal range per client Medications Reviewed Today     Reviewed by Afton Horse (Social Worker) on 08/29/23 at [phone]  Med List Status: <None>   Medication Order Taking? Sig Documenting Provider Last Dose Status Informant  apixaban  (ELIQUIS ) 5 MG TABS tablet [id number] No Place 1 tablet (5 mg total) into feeding tube 2 (two) times daily. Gonfa, Taye T, MD 07/27/2023 10:00 AM Active Self, Pharmacy Records           Med Note (CRUTHIS, CHLOE C   Wed Jul 27, 2023  1:18 PM) Pt is adamant she is still taking this medication. Dispense report does not support this claim.  colchicine  0.6 MG tablet [id number] No Take 1 tablet (0.6 mg total) by mouth daily.  Patient not taking: Reported on 07/27/2023   Dawna Etienne  Donavon Fudge, DO Not Taking Active Self, Pharmacy Records  copper  tablet [id number]  Take 1 tablet (2 mg total) by mouth daily. Stechschulte, Avon Boers, MD  Active   doxycycline  (ADOXA) 100 MG tablet [id number] No Take 100 mg by mouth 2 (two) times daily.  Patient not taking: Reported on 07/27/2023   [provider] Not Taking Active Self, Pharmacy Records  furosemide  (LASIX ) 20 MG tablet [id number] No Take 1 tablet (20 mg total) by mouth daily. Jobe Mulder, DO 07/26/2023 Active Self, Pharmacy Records  lidocaine  (XYLOCAINE ) 2 % solution [id number] No Use as directed 5-10 mLs in the mouth or throat every 6 (six) hours as  needed for mouth pain.  Patient not taking: Reported on 07/27/2023   Jobe Mulder, DO Not Taking Active Self, Pharmacy Records  meclizine  (ANTIVERT ) 12.5 MG tablet [id number] No Take 1 tablet (12.5 mg total) by mouth 3 (three) times daily as needed for dizziness.  Patient taking differently: Take 12.5 mg by mouth 2 (two) times daily.   Jobe Mulder, DO 07/27/2023 Active Self, Pharmacy Records  methocarbamol  (ROBAXIN -750) 750 MG tablet [id number]  Take 1 tablet (750 mg total) by mouth every 6 (six) hours as needed for muscle spasms. Stechschulte, Avon Boers, MD  Active   Multiple Vitamin (MULTIVITAMIN WITH MINERALS) TABS tablet [id number] No Place 1 tablet into feeding tube daily.  Patient taking differently: Take 1 tablet by mouth in the morning and at bedtime.   Gonfa, Taye T, MD 07/27/2023 Active Self, Pharmacy Records  Multiple Vitamin (MULTIVITAMIN WITH MINERALS) TABS tablet [id number]  Take 1 tablet by mouth daily. Stechschulte, Avon Boers, MD  Active   ondansetron  (ZOFRAN ) 4 MG tablet [id number] No Place 1 tablet (4 mg total) into feeding tube every 6 (six) hours as needed for nausea.  Patient not taking: Reported on 07/27/2023   Eveline Hipps, DO Not Taking Active Self, Pharmacy Records           Med Note (CRUTHIS, CHLOE C   Wed Jul 27, 2023  1:11 PM)    oxyCODONE -acetaminophen  (PERCOCET) 5-325 MG tablet [id number]  Take 1 tablet by mouth every 4 (four) hours as needed. Stechschulte, Avon Boers, MD  Active   pantoprazole  (PROTONIX ) 40 MG tablet [id number] No Take 1 tablet (40 mg total) by mouth daily. Jobe Mulder, DO 07/27/2023 Expired 07/27/23 2359 Self, Pharmacy Records  polyethylene glycol powder (MIRALAX ) 17 GM/SCOOP powder [id number] No Take 17 g by mouth 2 (two) times daily as needed for mild constipation.  Patient not taking: Reported on 07/27/2023   Gonfa, Taye T, MD Not Taking Active Self, Pharmacy Records  thiamine  (VITAMIN B-1) 100 MG tablet [id number]  Take 2 tablets  (200 mg total) by mouth daily. Stechschulte, Avon Boers, MD  Active   Zinc  Sulfate 220 (50 Zn) MG TABS [id number]  Take 1 tablet (220 mg total) by mouth daily. Stechschulte, Avon Boers, MD  Active             Recommendation:   PCP Follow-up Follow up with surgeon as needed Take medications as prescribed Attend scheduled medical appointments Make transport arrangements to help transport client to and from her medical appointments Allow time for rest and ADLs completion Call LCSW as needed for SW support  Follow Up Plan:   Client has LCSW name and phone number. Client was encouraged to call LCSW as needed for SW support at [phone]   [name] Angel  MSW, LCSW [facility] Licensed  Clinical Scientist, clinical (histocompatibility and immunogenetics) Dial:  [phone] Fax:  [phone] Website:  [email or web address]

## 2023-08-31 ENCOUNTER — Ambulatory Visit: Admitting: Family Medicine

## 2023-09-02 DIAGNOSIS — K219 Gastro-esophageal reflux disease without esophagitis: Secondary | ICD-10-CM | POA: Diagnosis not present

## 2023-09-02 DIAGNOSIS — M5416 Radiculopathy, lumbar region: Secondary | ICD-10-CM | POA: Diagnosis not present

## 2023-09-02 DIAGNOSIS — D649 Anemia, unspecified: Secondary | ICD-10-CM | POA: Diagnosis not present

## 2023-09-02 DIAGNOSIS — R627 Adult failure to thrive: Secondary | ICD-10-CM | POA: Diagnosis not present

## 2023-09-02 DIAGNOSIS — F411 Generalized anxiety disorder: Secondary | ICD-10-CM | POA: Diagnosis not present

## 2023-09-02 DIAGNOSIS — H16049 Marginal corneal ulcer, unspecified eye: Secondary | ICD-10-CM | POA: Diagnosis not present

## 2023-09-02 DIAGNOSIS — E119 Type 2 diabetes mellitus without complications: Secondary | ICD-10-CM | POA: Diagnosis not present

## 2023-09-02 DIAGNOSIS — I10 Essential (primary) hypertension: Secondary | ICD-10-CM | POA: Diagnosis not present

## 2023-09-02 DIAGNOSIS — Z792 Long term (current) use of antibiotics: Secondary | ICD-10-CM | POA: Diagnosis not present

## 2023-09-02 DIAGNOSIS — G4733 Obstructive sleep apnea (adult) (pediatric): Secondary | ICD-10-CM | POA: Diagnosis not present

## 2023-09-02 DIAGNOSIS — R1314 Dysphagia, pharyngoesophageal phase: Secondary | ICD-10-CM | POA: Diagnosis not present

## 2023-09-02 DIAGNOSIS — Z86718 Personal history of other venous thrombosis and embolism: Secondary | ICD-10-CM | POA: Diagnosis not present

## 2023-09-02 DIAGNOSIS — E43 Unspecified severe protein-calorie malnutrition: Secondary | ICD-10-CM | POA: Diagnosis not present

## 2023-09-02 DIAGNOSIS — E785 Hyperlipidemia, unspecified: Secondary | ICD-10-CM | POA: Diagnosis not present

## 2023-09-02 DIAGNOSIS — M179 Osteoarthritis of knee, unspecified: Secondary | ICD-10-CM | POA: Diagnosis not present

## 2023-09-02 DIAGNOSIS — Z6835 Body mass index (BMI) 35.0-35.9, adult: Secondary | ICD-10-CM | POA: Diagnosis not present

## 2023-09-02 DIAGNOSIS — Z48815 Encounter for surgical aftercare following surgery on the digestive system: Secondary | ICD-10-CM | POA: Diagnosis not present

## 2023-09-02 DIAGNOSIS — M109 Gout, unspecified: Secondary | ICD-10-CM | POA: Diagnosis not present

## 2023-09-02 DIAGNOSIS — F32A Depression, unspecified: Secondary | ICD-10-CM | POA: Diagnosis not present

## 2023-09-02 DIAGNOSIS — Z79899 Other long term (current) drug therapy: Secondary | ICD-10-CM | POA: Diagnosis not present

## 2023-09-02 DIAGNOSIS — Z7901 Long term (current) use of anticoagulants: Secondary | ICD-10-CM | POA: Diagnosis not present

## 2023-09-03 DIAGNOSIS — Z86718 Personal history of other venous thrombosis and embolism: Secondary | ICD-10-CM | POA: Diagnosis not present

## 2023-09-03 DIAGNOSIS — Z79899 Other long term (current) drug therapy: Secondary | ICD-10-CM | POA: Diagnosis not present

## 2023-09-03 DIAGNOSIS — K219 Gastro-esophageal reflux disease without esophagitis: Secondary | ICD-10-CM | POA: Diagnosis not present

## 2023-09-03 DIAGNOSIS — Z6835 Body mass index (BMI) 35.0-35.9, adult: Secondary | ICD-10-CM | POA: Diagnosis not present

## 2023-09-03 DIAGNOSIS — R627 Adult failure to thrive: Secondary | ICD-10-CM | POA: Diagnosis not present

## 2023-09-03 DIAGNOSIS — Z48815 Encounter for surgical aftercare following surgery on the digestive system: Secondary | ICD-10-CM | POA: Diagnosis not present

## 2023-09-03 DIAGNOSIS — E785 Hyperlipidemia, unspecified: Secondary | ICD-10-CM | POA: Diagnosis not present

## 2023-09-03 DIAGNOSIS — I10 Essential (primary) hypertension: Secondary | ICD-10-CM | POA: Diagnosis not present

## 2023-09-03 DIAGNOSIS — F32A Depression, unspecified: Secondary | ICD-10-CM | POA: Diagnosis not present

## 2023-09-03 DIAGNOSIS — F411 Generalized anxiety disorder: Secondary | ICD-10-CM | POA: Diagnosis not present

## 2023-09-03 DIAGNOSIS — Z792 Long term (current) use of antibiotics: Secondary | ICD-10-CM | POA: Diagnosis not present

## 2023-09-03 DIAGNOSIS — M5416 Radiculopathy, lumbar region: Secondary | ICD-10-CM | POA: Diagnosis not present

## 2023-09-03 DIAGNOSIS — M109 Gout, unspecified: Secondary | ICD-10-CM | POA: Diagnosis not present

## 2023-09-03 DIAGNOSIS — D649 Anemia, unspecified: Secondary | ICD-10-CM | POA: Diagnosis not present

## 2023-09-03 DIAGNOSIS — E43 Unspecified severe protein-calorie malnutrition: Secondary | ICD-10-CM | POA: Diagnosis not present

## 2023-09-03 DIAGNOSIS — G4733 Obstructive sleep apnea (adult) (pediatric): Secondary | ICD-10-CM | POA: Diagnosis not present

## 2023-09-03 DIAGNOSIS — H16049 Marginal corneal ulcer, unspecified eye: Secondary | ICD-10-CM | POA: Diagnosis not present

## 2023-09-03 DIAGNOSIS — R1314 Dysphagia, pharyngoesophageal phase: Secondary | ICD-10-CM | POA: Diagnosis not present

## 2023-09-03 DIAGNOSIS — M179 Osteoarthritis of knee, unspecified: Secondary | ICD-10-CM | POA: Diagnosis not present

## 2023-09-03 DIAGNOSIS — E119 Type 2 diabetes mellitus without complications: Secondary | ICD-10-CM | POA: Diagnosis not present

## 2023-09-03 DIAGNOSIS — Z7901 Long term (current) use of anticoagulants: Secondary | ICD-10-CM | POA: Diagnosis not present

## 2023-09-05 DIAGNOSIS — M5416 Radiculopathy, lumbar region: Secondary | ICD-10-CM | POA: Diagnosis not present

## 2023-09-05 DIAGNOSIS — E43 Unspecified severe protein-calorie malnutrition: Secondary | ICD-10-CM | POA: Diagnosis not present

## 2023-09-05 DIAGNOSIS — R1314 Dysphagia, pharyngoesophageal phase: Secondary | ICD-10-CM | POA: Diagnosis not present

## 2023-09-05 DIAGNOSIS — Z48815 Encounter for surgical aftercare following surgery on the digestive system: Secondary | ICD-10-CM | POA: Diagnosis not present

## 2023-09-05 DIAGNOSIS — I10 Essential (primary) hypertension: Secondary | ICD-10-CM | POA: Diagnosis not present

## 2023-09-05 DIAGNOSIS — Z86718 Personal history of other venous thrombosis and embolism: Secondary | ICD-10-CM | POA: Diagnosis not present

## 2023-09-05 DIAGNOSIS — M179 Osteoarthritis of knee, unspecified: Secondary | ICD-10-CM | POA: Diagnosis not present

## 2023-09-05 DIAGNOSIS — D649 Anemia, unspecified: Secondary | ICD-10-CM | POA: Diagnosis not present

## 2023-09-05 DIAGNOSIS — E785 Hyperlipidemia, unspecified: Secondary | ICD-10-CM | POA: Diagnosis not present

## 2023-09-05 DIAGNOSIS — G4733 Obstructive sleep apnea (adult) (pediatric): Secondary | ICD-10-CM | POA: Diagnosis not present

## 2023-09-05 DIAGNOSIS — Z792 Long term (current) use of antibiotics: Secondary | ICD-10-CM | POA: Diagnosis not present

## 2023-09-05 DIAGNOSIS — F411 Generalized anxiety disorder: Secondary | ICD-10-CM | POA: Diagnosis not present

## 2023-09-05 DIAGNOSIS — K219 Gastro-esophageal reflux disease without esophagitis: Secondary | ICD-10-CM | POA: Diagnosis not present

## 2023-09-05 DIAGNOSIS — Z79899 Other long term (current) drug therapy: Secondary | ICD-10-CM | POA: Diagnosis not present

## 2023-09-05 DIAGNOSIS — H16049 Marginal corneal ulcer, unspecified eye: Secondary | ICD-10-CM | POA: Diagnosis not present

## 2023-09-05 DIAGNOSIS — F32A Depression, unspecified: Secondary | ICD-10-CM | POA: Diagnosis not present

## 2023-09-05 DIAGNOSIS — M109 Gout, unspecified: Secondary | ICD-10-CM | POA: Diagnosis not present

## 2023-09-05 DIAGNOSIS — Z6835 Body mass index (BMI) 35.0-35.9, adult: Secondary | ICD-10-CM | POA: Diagnosis not present

## 2023-09-05 DIAGNOSIS — E119 Type 2 diabetes mellitus without complications: Secondary | ICD-10-CM | POA: Diagnosis not present

## 2023-09-05 DIAGNOSIS — Z7901 Long term (current) use of anticoagulants: Secondary | ICD-10-CM | POA: Diagnosis not present

## 2023-09-05 DIAGNOSIS — R627 Adult failure to thrive: Secondary | ICD-10-CM | POA: Diagnosis not present

## 2023-09-06 ENCOUNTER — Telehealth: Payer: Self-pay | Admitting: Family Medicine

## 2023-09-06 NOTE — Telephone Encounter (Signed)
 Copied from CRM [id number]. Topic: Medical Record Request - Provider/Facility Request >> Sep 06, 2023 11:06 AM Arm[place] J wrote: Reason for CRM: Kaitlyn calling in from [facility] asking if fax orders from May 10th, 11th, and 12th were received. Please call her when faxes are received at: [phone] Ext. [phone]

## 2023-09-06 NOTE — Telephone Encounter (Signed)
 Called [facility] back advised them we did fax form back, Kaitilyn gave  Another fax number [phone]. Refax the paperwork.

## 2023-09-08 ENCOUNTER — Encounter: Payer: Self-pay | Admitting: Family Medicine

## 2023-09-08 DIAGNOSIS — K219 Gastro-esophageal reflux disease without esophagitis: Secondary | ICD-10-CM | POA: Diagnosis not present

## 2023-09-08 DIAGNOSIS — H16049 Marginal corneal ulcer, unspecified eye: Secondary | ICD-10-CM | POA: Diagnosis not present

## 2023-09-08 DIAGNOSIS — F32A Depression, unspecified: Secondary | ICD-10-CM | POA: Diagnosis not present

## 2023-09-08 DIAGNOSIS — R627 Adult failure to thrive: Secondary | ICD-10-CM | POA: Diagnosis not present

## 2023-09-08 DIAGNOSIS — D649 Anemia, unspecified: Secondary | ICD-10-CM | POA: Diagnosis not present

## 2023-09-08 DIAGNOSIS — Z792 Long term (current) use of antibiotics: Secondary | ICD-10-CM | POA: Diagnosis not present

## 2023-09-08 DIAGNOSIS — G4733 Obstructive sleep apnea (adult) (pediatric): Secondary | ICD-10-CM | POA: Diagnosis not present

## 2023-09-08 DIAGNOSIS — M5416 Radiculopathy, lumbar region: Secondary | ICD-10-CM | POA: Diagnosis not present

## 2023-09-08 DIAGNOSIS — F411 Generalized anxiety disorder: Secondary | ICD-10-CM | POA: Diagnosis not present

## 2023-09-08 DIAGNOSIS — M109 Gout, unspecified: Secondary | ICD-10-CM | POA: Diagnosis not present

## 2023-09-08 DIAGNOSIS — E43 Unspecified severe protein-calorie malnutrition: Secondary | ICD-10-CM | POA: Diagnosis not present

## 2023-09-08 DIAGNOSIS — Z6835 Body mass index (BMI) 35.0-35.9, adult: Secondary | ICD-10-CM | POA: Diagnosis not present

## 2023-09-08 DIAGNOSIS — R1314 Dysphagia, pharyngoesophageal phase: Secondary | ICD-10-CM | POA: Diagnosis not present

## 2023-09-08 DIAGNOSIS — E785 Hyperlipidemia, unspecified: Secondary | ICD-10-CM | POA: Diagnosis not present

## 2023-09-08 DIAGNOSIS — Z48815 Encounter for surgical aftercare following surgery on the digestive system: Secondary | ICD-10-CM | POA: Diagnosis not present

## 2023-09-08 DIAGNOSIS — E119 Type 2 diabetes mellitus without complications: Secondary | ICD-10-CM | POA: Diagnosis not present

## 2023-09-08 DIAGNOSIS — M179 Osteoarthritis of knee, unspecified: Secondary | ICD-10-CM | POA: Diagnosis not present

## 2023-09-08 DIAGNOSIS — Z79899 Other long term (current) drug therapy: Secondary | ICD-10-CM | POA: Diagnosis not present

## 2023-09-08 DIAGNOSIS — Z7901 Long term (current) use of anticoagulants: Secondary | ICD-10-CM | POA: Diagnosis not present

## 2023-09-08 DIAGNOSIS — Z86718 Personal history of other venous thrombosis and embolism: Secondary | ICD-10-CM | POA: Diagnosis not present

## 2023-09-08 DIAGNOSIS — I10 Essential (primary) hypertension: Secondary | ICD-10-CM | POA: Diagnosis not present

## 2023-09-12 ENCOUNTER — Other Ambulatory Visit: Payer: Self-pay

## 2023-09-12 DIAGNOSIS — H16049 Marginal corneal ulcer, unspecified eye: Secondary | ICD-10-CM | POA: Diagnosis not present

## 2023-09-12 DIAGNOSIS — D649 Anemia, unspecified: Secondary | ICD-10-CM | POA: Diagnosis not present

## 2023-09-12 DIAGNOSIS — E785 Hyperlipidemia, unspecified: Secondary | ICD-10-CM | POA: Diagnosis not present

## 2023-09-12 DIAGNOSIS — Z48815 Encounter for surgical aftercare following surgery on the digestive system: Secondary | ICD-10-CM | POA: Diagnosis not present

## 2023-09-12 DIAGNOSIS — R627 Adult failure to thrive: Secondary | ICD-10-CM | POA: Diagnosis not present

## 2023-09-12 DIAGNOSIS — R1314 Dysphagia, pharyngoesophageal phase: Secondary | ICD-10-CM | POA: Diagnosis not present

## 2023-09-12 DIAGNOSIS — M5416 Radiculopathy, lumbar region: Secondary | ICD-10-CM | POA: Diagnosis not present

## 2023-09-12 DIAGNOSIS — I10 Essential (primary) hypertension: Secondary | ICD-10-CM | POA: Diagnosis not present

## 2023-09-12 DIAGNOSIS — Z7901 Long term (current) use of anticoagulants: Secondary | ICD-10-CM | POA: Diagnosis not present

## 2023-09-12 DIAGNOSIS — Z792 Long term (current) use of antibiotics: Secondary | ICD-10-CM | POA: Diagnosis not present

## 2023-09-12 DIAGNOSIS — K219 Gastro-esophageal reflux disease without esophagitis: Secondary | ICD-10-CM | POA: Diagnosis not present

## 2023-09-12 DIAGNOSIS — G4733 Obstructive sleep apnea (adult) (pediatric): Secondary | ICD-10-CM | POA: Diagnosis not present

## 2023-09-12 DIAGNOSIS — I82412 Acute embolism and thrombosis of left femoral vein: Secondary | ICD-10-CM

## 2023-09-12 DIAGNOSIS — F411 Generalized anxiety disorder: Secondary | ICD-10-CM | POA: Diagnosis not present

## 2023-09-12 DIAGNOSIS — Z79899 Other long term (current) drug therapy: Secondary | ICD-10-CM | POA: Diagnosis not present

## 2023-09-12 DIAGNOSIS — F32A Depression, unspecified: Secondary | ICD-10-CM | POA: Diagnosis not present

## 2023-09-12 DIAGNOSIS — E119 Type 2 diabetes mellitus without complications: Secondary | ICD-10-CM | POA: Diagnosis not present

## 2023-09-12 DIAGNOSIS — Z86718 Personal history of other venous thrombosis and embolism: Secondary | ICD-10-CM | POA: Diagnosis not present

## 2023-09-12 DIAGNOSIS — E43 Unspecified severe protein-calorie malnutrition: Secondary | ICD-10-CM | POA: Diagnosis not present

## 2023-09-12 DIAGNOSIS — M109 Gout, unspecified: Secondary | ICD-10-CM | POA: Diagnosis not present

## 2023-09-12 DIAGNOSIS — Z6835 Body mass index (BMI) 35.0-35.9, adult: Secondary | ICD-10-CM | POA: Diagnosis not present

## 2023-09-12 DIAGNOSIS — M179 Osteoarthritis of knee, unspecified: Secondary | ICD-10-CM | POA: Diagnosis not present

## 2023-09-13 ENCOUNTER — Telehealth: Payer: Self-pay | Admitting: Family Medicine

## 2023-09-13 DIAGNOSIS — Z792 Long term (current) use of antibiotics: Secondary | ICD-10-CM | POA: Diagnosis not present

## 2023-09-13 DIAGNOSIS — M109 Gout, unspecified: Secondary | ICD-10-CM | POA: Diagnosis not present

## 2023-09-13 DIAGNOSIS — M5416 Radiculopathy, lumbar region: Secondary | ICD-10-CM | POA: Diagnosis not present

## 2023-09-13 DIAGNOSIS — H16049 Marginal corneal ulcer, unspecified eye: Secondary | ICD-10-CM | POA: Diagnosis not present

## 2023-09-13 DIAGNOSIS — D649 Anemia, unspecified: Secondary | ICD-10-CM | POA: Diagnosis not present

## 2023-09-13 DIAGNOSIS — Z7901 Long term (current) use of anticoagulants: Secondary | ICD-10-CM | POA: Diagnosis not present

## 2023-09-13 DIAGNOSIS — Z48815 Encounter for surgical aftercare following surgery on the digestive system: Secondary | ICD-10-CM | POA: Diagnosis not present

## 2023-09-13 DIAGNOSIS — M179 Osteoarthritis of knee, unspecified: Secondary | ICD-10-CM | POA: Diagnosis not present

## 2023-09-13 DIAGNOSIS — E43 Unspecified severe protein-calorie malnutrition: Secondary | ICD-10-CM | POA: Diagnosis not present

## 2023-09-13 DIAGNOSIS — G4733 Obstructive sleep apnea (adult) (pediatric): Secondary | ICD-10-CM | POA: Diagnosis not present

## 2023-09-13 DIAGNOSIS — K219 Gastro-esophageal reflux disease without esophagitis: Secondary | ICD-10-CM | POA: Diagnosis not present

## 2023-09-13 DIAGNOSIS — E785 Hyperlipidemia, unspecified: Secondary | ICD-10-CM | POA: Diagnosis not present

## 2023-09-13 DIAGNOSIS — Z6835 Body mass index (BMI) 35.0-35.9, adult: Secondary | ICD-10-CM | POA: Diagnosis not present

## 2023-09-13 DIAGNOSIS — Z79899 Other long term (current) drug therapy: Secondary | ICD-10-CM | POA: Diagnosis not present

## 2023-09-13 DIAGNOSIS — I10 Essential (primary) hypertension: Secondary | ICD-10-CM | POA: Diagnosis not present

## 2023-09-13 DIAGNOSIS — R1314 Dysphagia, pharyngoesophageal phase: Secondary | ICD-10-CM | POA: Diagnosis not present

## 2023-09-13 DIAGNOSIS — E119 Type 2 diabetes mellitus without complications: Secondary | ICD-10-CM | POA: Diagnosis not present

## 2023-09-13 DIAGNOSIS — Z86718 Personal history of other venous thrombosis and embolism: Secondary | ICD-10-CM | POA: Diagnosis not present

## 2023-09-13 DIAGNOSIS — F411 Generalized anxiety disorder: Secondary | ICD-10-CM | POA: Diagnosis not present

## 2023-09-13 DIAGNOSIS — R627 Adult failure to thrive: Secondary | ICD-10-CM | POA: Diagnosis not present

## 2023-09-13 DIAGNOSIS — F32A Depression, unspecified: Secondary | ICD-10-CM | POA: Diagnosis not present

## 2023-09-13 NOTE — Telephone Encounter (Signed)
 Copied from CRM [id number]. Topic: General - Other >> Sep 13, 2023 12:34 PM Taleah C wrote: Reason for CRM: Caitlyn from [facility] called and stated that they sent a fax on 5/16 regarding a skilled nursing order for 1 week 7. The order number is #[id number]. She asked for a callback at  [phone] exts. [phone]. Please advise.

## 2023-09-14 DIAGNOSIS — M109 Gout, unspecified: Secondary | ICD-10-CM | POA: Diagnosis not present

## 2023-09-14 DIAGNOSIS — E43 Unspecified severe protein-calorie malnutrition: Secondary | ICD-10-CM | POA: Diagnosis not present

## 2023-09-14 DIAGNOSIS — R627 Adult failure to thrive: Secondary | ICD-10-CM | POA: Diagnosis not present

## 2023-09-14 DIAGNOSIS — R1314 Dysphagia, pharyngoesophageal phase: Secondary | ICD-10-CM | POA: Diagnosis not present

## 2023-09-14 DIAGNOSIS — I10 Essential (primary) hypertension: Secondary | ICD-10-CM | POA: Diagnosis not present

## 2023-09-14 DIAGNOSIS — F411 Generalized anxiety disorder: Secondary | ICD-10-CM | POA: Diagnosis not present

## 2023-09-14 DIAGNOSIS — H16049 Marginal corneal ulcer, unspecified eye: Secondary | ICD-10-CM | POA: Diagnosis not present

## 2023-09-14 DIAGNOSIS — F32A Depression, unspecified: Secondary | ICD-10-CM | POA: Diagnosis not present

## 2023-09-14 DIAGNOSIS — M5416 Radiculopathy, lumbar region: Secondary | ICD-10-CM | POA: Diagnosis not present

## 2023-09-14 DIAGNOSIS — Z86718 Personal history of other venous thrombosis and embolism: Secondary | ICD-10-CM | POA: Diagnosis not present

## 2023-09-14 DIAGNOSIS — Z48815 Encounter for surgical aftercare following surgery on the digestive system: Secondary | ICD-10-CM | POA: Diagnosis not present

## 2023-09-14 DIAGNOSIS — G4733 Obstructive sleep apnea (adult) (pediatric): Secondary | ICD-10-CM | POA: Diagnosis not present

## 2023-09-14 DIAGNOSIS — K219 Gastro-esophageal reflux disease without esophagitis: Secondary | ICD-10-CM | POA: Diagnosis not present

## 2023-09-14 DIAGNOSIS — D649 Anemia, unspecified: Secondary | ICD-10-CM | POA: Diagnosis not present

## 2023-09-14 DIAGNOSIS — Z79899 Other long term (current) drug therapy: Secondary | ICD-10-CM | POA: Diagnosis not present

## 2023-09-14 DIAGNOSIS — Z7901 Long term (current) use of anticoagulants: Secondary | ICD-10-CM | POA: Diagnosis not present

## 2023-09-14 DIAGNOSIS — Z792 Long term (current) use of antibiotics: Secondary | ICD-10-CM | POA: Diagnosis not present

## 2023-09-14 DIAGNOSIS — M179 Osteoarthritis of knee, unspecified: Secondary | ICD-10-CM | POA: Diagnosis not present

## 2023-09-14 DIAGNOSIS — E119 Type 2 diabetes mellitus without complications: Secondary | ICD-10-CM | POA: Diagnosis not present

## 2023-09-14 DIAGNOSIS — E785 Hyperlipidemia, unspecified: Secondary | ICD-10-CM | POA: Diagnosis not present

## 2023-09-14 DIAGNOSIS — Z6835 Body mass index (BMI) 35.0-35.9, adult: Secondary | ICD-10-CM | POA: Diagnosis not present

## 2023-09-14 NOTE — Telephone Encounter (Signed)
 Called Caitlyn from [facility] called and stated that they sent a fax on 5/16 regarding  a skilled nursing order for 1 week 7

## 2023-09-14 NOTE — Telephone Encounter (Signed)
 Paper work has been faxed

## 2023-09-16 ENCOUNTER — Telehealth: Payer: Self-pay

## 2023-09-16 ENCOUNTER — Other Ambulatory Visit (HOSPITAL_COMMUNITY): Payer: Self-pay

## 2023-09-16 ENCOUNTER — Other Ambulatory Visit: Payer: Self-pay

## 2023-09-16 ENCOUNTER — Encounter: Payer: Self-pay | Admitting: Pharmacist

## 2023-09-16 ENCOUNTER — Encounter: Payer: Self-pay | Admitting: Family Medicine

## 2023-09-16 ENCOUNTER — Ambulatory Visit: Payer: Self-pay | Admitting: Family Medicine

## 2023-09-16 ENCOUNTER — Ambulatory Visit (INDEPENDENT_AMBULATORY_CARE_PROVIDER_SITE_OTHER): Admitting: Family Medicine

## 2023-09-16 ENCOUNTER — Ambulatory Visit (HOSPITAL_BASED_OUTPATIENT_CLINIC_OR_DEPARTMENT_OTHER)
Admission: RE | Admit: 2023-09-16 | Discharge: 2023-09-16 | Disposition: A | Discharge status: Home / Self Care | Source: Ambulatory Visit | Attending: Family Medicine | Admitting: Family Medicine

## 2023-09-16 VITALS — BP 128/70 | HR 99 | Temp 98.0°F | Resp 16 | Ht 61.0 in | Wt 200.2 lb

## 2023-09-16 DIAGNOSIS — I82412 Acute embolism and thrombosis of left femoral vein: Secondary | ICD-10-CM

## 2023-09-16 DIAGNOSIS — K219 Gastro-esophageal reflux disease without esophagitis: Secondary | ICD-10-CM

## 2023-09-16 DIAGNOSIS — I700 Atherosclerosis of aorta: Secondary | ICD-10-CM

## 2023-09-16 DIAGNOSIS — M79641 Pain in right hand: Secondary | ICD-10-CM

## 2023-09-16 DIAGNOSIS — I82512 Chronic embolism and thrombosis of left femoral vein: Secondary | ICD-10-CM | POA: Diagnosis not present

## 2023-09-16 MED ORDER — APIXABAN 5 MG PO TABS
5.0000 mg | ORAL_TABLET | Freq: Two times a day (BID) | ORAL | 3 refills | Status: AC
  Filled 2023-09-16 – 2023-09-20 (×2): qty 60, 30d supply, fill #0

## 2023-09-16 MED ORDER — PANTOPRAZOLE SODIUM 40 MG PO TBEC
40.0000 mg | DELAYED_RELEASE_TABLET | Freq: Every day | ORAL | 1 refills | Status: AC
  Filled 2023-09-16: qty 90, 90d supply, fill #0

## 2023-09-16 MED ORDER — ROSUVASTATIN CALCIUM 10 MG PO TABS
10.0000 mg | ORAL_TABLET | Freq: Every day | ORAL | 3 refills | Status: AC
Start: 2023-09-16 — End: ?
  Filled 2023-09-16: qty 90, 90d supply, fill #0

## 2023-09-16 NOTE — Patient Instructions (Addendum)
 Keep the diet clean and stay active.  We will be in touch with your results and if you need to stay on the Eliquis .   Let us  know if you need anything.  Hand Exercises Hand exercises can be helpful for almost anyone. These exercises can strengthen the hands, improve flexibility and movement, and increase blood flow to the hands. These results can make work and daily tasks easier. Hand exercises can be especially helpful for people who have joint pain from arthritis or have nerve damage from overuse (carpal tunnel syndrome). These exercises can also help people who have injured a hand. Exercises Most of these hand exercises are gentle stretching and motion exercises. It is usually safe to do them often throughout the day. Warming up your hands before exercise may help to reduce stiffness. You can do this with gentle massage or by placing your hands in warm water  for 10-15 minutes. It is normal to feel some stretching, pulling, tightness, or mild discomfort as you begin new exercises. This will gradually improve. Stop an exercise right away if you feel sudden, severe pain or your pain gets worse. Ask your health care provider which exercises are best for you. Knuckle bend or "claw" fist Stand or sit with your arm, hand, and all five fingers pointed straight up. Make sure to keep your wrist straight during the exercise. Gently bend your fingers down toward your palm until the tips of your fingers are touching the top of your palm. Keep your big knuckle straight and just bend the small knuckles in your fingers. Hold this position for 3 seconds. Straighten (extend) your fingers back to the starting position. Repeat this exercise 5-10 times with each hand. Full finger fist Stand or sit with your arm, hand, and all five fingers pointed straight up. Make sure to keep your wrist straight during the exercise. Gently bend your fingers into your palm until the tips of your fingers are touching the middle of  your palm. Hold this position for 3 seconds. Extend your fingers back to the starting position, stretching every joint fully. Repeat this exercise 5-10 times with each hand. Straight fist Stand or sit with your arm, hand, and all five fingers pointed straight up. Make sure to keep your wrist straight during the exercise. Gently bend your fingers at the big knuckle, where your fingers meet your hand, and the middle knuckle. Keep the knuckle at the tips of your fingers straight and try to touch the bottom of your palm. Hold this position for 3 seconds. Extend your fingers back to the starting position, stretching every joint fully. Repeat this exercise 5-10 times with each hand. Tabletop Stand or sit with your arm, hand, and all five fingers pointed straight up. Make sure to keep your wrist straight during the exercise. Gently bend your fingers at the big knuckle, where your fingers meet your hand, as far down as you can while keeping the small knuckles in your fingers straight. Think of forming a tabletop with your fingers. Hold this position for 3 seconds. Extend your fingers back to the starting position, stretching every joint fully. Repeat this exercise 5-10 times with each hand. Finger spread Place your hand flat on a table with your palm facing down. Make sure your wrist stays straight as you do this exercise. Spread your fingers and thumb apart from each other as far as you can until you feel a gentle stretch. Hold this position for 3 seconds. Bring your fingers and thumb tight  together again. Hold this position for 3 seconds. Repeat this exercise 5-10 times with each hand. Making circles Stand or sit with your arm, hand, and all five fingers pointed straight up. Make sure to keep your wrist straight during the exercise. Make a circle by touching the tip of your thumb to the tip of your index finger. Hold for 3 seconds. Then open your hand wide. Repeat this motion with your thumb and  each finger on your hand. Repeat this exercise 5-10 times with each hand. Thumb motion Sit with your forearm resting on a table and your wrist straight. Your thumb should be facing up toward the ceiling. Keep your fingers relaxed as you move your thumb. Lift your thumb up as high as you can toward the ceiling. Hold for 3 seconds. Bend your thumb across your palm as far as you can, reaching the tip of your thumb for the small finger (pinkie) side of your palm. Hold for 3 seconds. Repeat this exercise 5-10 times with each hand. Grip strengthening  Hold a stress ball or other soft ball in the middle of your hand. Slowly increase the pressure, squeezing the ball as much as you can without causing pain. Think of bringing the tips of your fingers into the middle of your palm. All of your finger joints should bend when doing this exercise. Hold your squeeze for 3 seconds, then relax. Repeat this exercise 5-10 times with each hand. Contact a health care provider if: Your hand pain or discomfort gets much worse when you do an exercise. Your hand pain or discomfort does not improve within 2 hours after you exercise. If you have any of these problems, stop doing these exercises right away. Do not do them again unless your health care provider says that you can. Get help right away if: You develop sudden, severe hand pain or swelling. If this happens, stop doing these exercises right away. Do not do them again unless your health care provider says that you can. Make sure you discuss any questions you have with your health care provider. Document Revised: 08/03/2018 Document Reviewed: 04/13/2018 Elsevier Patient Education  2020 [employer].

## 2023-09-16 NOTE — Progress Notes (Signed)
 Chief Complaint  Patient presents with   Hospitalization Follow-up    Hospital Follow Up    Subjective: Patient is a 73 y.o. female here for follow-up.  Patient had a bariatric surgery reversal around a month ago.  She had been having abdominal pain and failure to thrive for nearly a year.  She reports significant improvement.  She is now able to walk.  She would like to go over her medications today.  Aortic atherosclerosis: Stopped Crestor  in the past.  Was taking with good compliance and no adverse effects historically.  She has none at home.  Diet is much improved as she is able to eat again.  She is starting to walk around the house more.  No chest pain or shortness of breath.  GERD: Currently taking pantoprazole  40 mg daily.  She reports compliance and no adverse effects.  She does not take the medication, she starts having reflux symptoms again.  Denies unintentional weight loss, bleeding, or difficulty swallowing.  Right hand pain: Starting around 6 months ago, she started having progressive palmar surface pain over her distal hand and fingers.  This is progressing where she can no longer grip adequately due to the pain.  No bruising, redness, swelling, or fevers.  She has an appointment with a specialist in 4 days.  Past Medical History:  Diagnosis Date   Allergy    dust, pollen, sulfa, prednisone   Anemia    Anxiety    Arthritis    "knees" (04/26/2016)   Colon polyps    benign per pt   Coronary artery disease    mild per 2015 cath in [place]  (OM1 30%, RCA 30%)   GERD (gastroesophageal reflux disease)    Gout    History of hiatal hernia    Hyperlipidemia 05/20/2021   Hypertension    Migraine    "none since early /2017" (05/06/2016)   Obesity    OSA on CPAP    uses CPAP   Pre-diabetes    Pre-operative cardiovascular examination 08/22/2008   Renal insufficiency 11/09/2022   Vasculitis (HCC)    Bilateral    Objective: BP 128/70 (BP Location: Left Arm, Patient  Position: Sitting)   Pulse 99   Temp 98 F (36.7 C) (Oral)   Resp 16   Ht 5\' 1"  (1.549 m)   Wt 200 lb 3.2 oz (90.8 kg)   SpO2 99%   BMI 37.83 kg/m  General: Awake, appears stated age Lungs: No accessory muscle use MSK: Mild TTP over the flexor tendons of digits 2 through 5 on the right hand.  Grip strength is poor on the right.  No edema, ecchymosis, erythema, excessive warmth.  Good range of motion at the wrist.  No TTP over the carpal row or forearm flexor tendons. Psych: Age appropriate judgment and insight, normal affect and mood  Assessment and Plan: Right hand pain  Gastroesophageal reflux disease, unspecified whether esophagitis present  Aortic atherosclerosis (HCC)  Appreciate Ortho hand.  Stretches and exercises provided.  Heat, ice, Tylenol . Chronic, stable.  Continue pantoprazole  40 mg daily. Chronic, not obviously managed.  Restart Crestor  10 mg daily.  Counseled on diet and exercise. She is moving to [place]  in a couple months.  She will let me know if she needs a refill of anything locally. The patient voiced understanding and agreement to the plan.  Shellie Dials [place], DO 09/16/23  11:58 AM

## 2023-09-16 NOTE — Telephone Encounter (Signed)
 Radiology called and imaging was Dr.Wendling of [facility] sent pt message. Pt come back to our office and was advised to follow up with Hematology, when she move back to [facility] [place] Pt stated understand.

## 2023-09-18 ENCOUNTER — Other Ambulatory Visit: Payer: Self-pay

## 2023-09-19 DIAGNOSIS — E785 Hyperlipidemia, unspecified: Secondary | ICD-10-CM | POA: Diagnosis not present

## 2023-09-19 DIAGNOSIS — E119 Type 2 diabetes mellitus without complications: Secondary | ICD-10-CM | POA: Diagnosis not present

## 2023-09-19 DIAGNOSIS — D649 Anemia, unspecified: Secondary | ICD-10-CM | POA: Diagnosis not present

## 2023-09-19 DIAGNOSIS — Z6835 Body mass index (BMI) 35.0-35.9, adult: Secondary | ICD-10-CM | POA: Diagnosis not present

## 2023-09-19 DIAGNOSIS — Z7901 Long term (current) use of anticoagulants: Secondary | ICD-10-CM | POA: Diagnosis not present

## 2023-09-19 DIAGNOSIS — M179 Osteoarthritis of knee, unspecified: Secondary | ICD-10-CM | POA: Diagnosis not present

## 2023-09-19 DIAGNOSIS — G4733 Obstructive sleep apnea (adult) (pediatric): Secondary | ICD-10-CM | POA: Diagnosis not present

## 2023-09-19 DIAGNOSIS — M5416 Radiculopathy, lumbar region: Secondary | ICD-10-CM | POA: Diagnosis not present

## 2023-09-19 DIAGNOSIS — Z79899 Other long term (current) drug therapy: Secondary | ICD-10-CM | POA: Diagnosis not present

## 2023-09-19 DIAGNOSIS — F32A Depression, unspecified: Secondary | ICD-10-CM | POA: Diagnosis not present

## 2023-09-19 DIAGNOSIS — I10 Essential (primary) hypertension: Secondary | ICD-10-CM | POA: Diagnosis not present

## 2023-09-19 DIAGNOSIS — H16049 Marginal corneal ulcer, unspecified eye: Secondary | ICD-10-CM | POA: Diagnosis not present

## 2023-09-19 DIAGNOSIS — F411 Generalized anxiety disorder: Secondary | ICD-10-CM | POA: Diagnosis not present

## 2023-09-19 DIAGNOSIS — E43 Unspecified severe protein-calorie malnutrition: Secondary | ICD-10-CM | POA: Diagnosis not present

## 2023-09-19 DIAGNOSIS — Z792 Long term (current) use of antibiotics: Secondary | ICD-10-CM | POA: Diagnosis not present

## 2023-09-19 DIAGNOSIS — R627 Adult failure to thrive: Secondary | ICD-10-CM | POA: Diagnosis not present

## 2023-09-19 DIAGNOSIS — K219 Gastro-esophageal reflux disease without esophagitis: Secondary | ICD-10-CM | POA: Diagnosis not present

## 2023-09-19 DIAGNOSIS — R1314 Dysphagia, pharyngoesophageal phase: Secondary | ICD-10-CM | POA: Diagnosis not present

## 2023-09-19 DIAGNOSIS — M109 Gout, unspecified: Secondary | ICD-10-CM | POA: Diagnosis not present

## 2023-09-19 DIAGNOSIS — Z86718 Personal history of other venous thrombosis and embolism: Secondary | ICD-10-CM | POA: Diagnosis not present

## 2023-09-19 DIAGNOSIS — Z48815 Encounter for surgical aftercare following surgery on the digestive system: Secondary | ICD-10-CM | POA: Diagnosis not present

## 2023-09-20 ENCOUNTER — Other Ambulatory Visit (HOSPITAL_COMMUNITY): Payer: Self-pay

## 2023-09-21 ENCOUNTER — Ambulatory Visit: Admitting: Physician Assistant

## 2023-09-21 ENCOUNTER — Telehealth: Payer: Self-pay | Admitting: Family Medicine

## 2023-09-21 DIAGNOSIS — E785 Hyperlipidemia, unspecified: Secondary | ICD-10-CM | POA: Diagnosis not present

## 2023-09-21 DIAGNOSIS — Z792 Long term (current) use of antibiotics: Secondary | ICD-10-CM | POA: Diagnosis not present

## 2023-09-21 DIAGNOSIS — E43 Unspecified severe protein-calorie malnutrition: Secondary | ICD-10-CM | POA: Diagnosis not present

## 2023-09-21 DIAGNOSIS — Z48815 Encounter for surgical aftercare following surgery on the digestive system: Secondary | ICD-10-CM | POA: Diagnosis not present

## 2023-09-21 DIAGNOSIS — G4733 Obstructive sleep apnea (adult) (pediatric): Secondary | ICD-10-CM | POA: Diagnosis not present

## 2023-09-21 DIAGNOSIS — E119 Type 2 diabetes mellitus without complications: Secondary | ICD-10-CM | POA: Diagnosis not present

## 2023-09-21 DIAGNOSIS — D649 Anemia, unspecified: Secondary | ICD-10-CM | POA: Diagnosis not present

## 2023-09-21 DIAGNOSIS — F32A Depression, unspecified: Secondary | ICD-10-CM | POA: Diagnosis not present

## 2023-09-21 DIAGNOSIS — H16049 Marginal corneal ulcer, unspecified eye: Secondary | ICD-10-CM | POA: Diagnosis not present

## 2023-09-21 DIAGNOSIS — F411 Generalized anxiety disorder: Secondary | ICD-10-CM | POA: Diagnosis not present

## 2023-09-21 DIAGNOSIS — M5416 Radiculopathy, lumbar region: Secondary | ICD-10-CM | POA: Diagnosis not present

## 2023-09-21 DIAGNOSIS — M179 Osteoarthritis of knee, unspecified: Secondary | ICD-10-CM | POA: Diagnosis not present

## 2023-09-21 DIAGNOSIS — M109 Gout, unspecified: Secondary | ICD-10-CM | POA: Diagnosis not present

## 2023-09-21 DIAGNOSIS — R1314 Dysphagia, pharyngoesophageal phase: Secondary | ICD-10-CM | POA: Diagnosis not present

## 2023-09-21 DIAGNOSIS — K219 Gastro-esophageal reflux disease without esophagitis: Secondary | ICD-10-CM | POA: Diagnosis not present

## 2023-09-21 DIAGNOSIS — Z6835 Body mass index (BMI) 35.0-35.9, adult: Secondary | ICD-10-CM | POA: Diagnosis not present

## 2023-09-21 DIAGNOSIS — R627 Adult failure to thrive: Secondary | ICD-10-CM | POA: Diagnosis not present

## 2023-09-21 DIAGNOSIS — I10 Essential (primary) hypertension: Secondary | ICD-10-CM | POA: Diagnosis not present

## 2023-09-21 DIAGNOSIS — Z86718 Personal history of other venous thrombosis and embolism: Secondary | ICD-10-CM | POA: Diagnosis not present

## 2023-09-21 DIAGNOSIS — Z79899 Other long term (current) drug therapy: Secondary | ICD-10-CM | POA: Diagnosis not present

## 2023-09-21 DIAGNOSIS — Z7901 Long term (current) use of anticoagulants: Secondary | ICD-10-CM | POA: Diagnosis not present

## 2023-09-21 NOTE — Telephone Encounter (Signed)
 Paperwork refaxed.

## 2023-09-21 NOTE — Telephone Encounter (Signed)
 Copied from CRM [id number]. Topic: General - Other >> Sep 21, 2023 12:07 PM Albertha Alosa wrote: Reason for CRM: Kaitlyn from [facility] home health called in regarding a form that have sent over on 05/20  Order Number is  [id number]  [phone]

## 2023-09-22 DIAGNOSIS — Z792 Long term (current) use of antibiotics: Secondary | ICD-10-CM | POA: Diagnosis not present

## 2023-09-22 DIAGNOSIS — R627 Adult failure to thrive: Secondary | ICD-10-CM | POA: Diagnosis not present

## 2023-09-22 DIAGNOSIS — Z79899 Other long term (current) drug therapy: Secondary | ICD-10-CM | POA: Diagnosis not present

## 2023-09-22 DIAGNOSIS — E43 Unspecified severe protein-calorie malnutrition: Secondary | ICD-10-CM | POA: Diagnosis not present

## 2023-09-22 DIAGNOSIS — M179 Osteoarthritis of knee, unspecified: Secondary | ICD-10-CM | POA: Diagnosis not present

## 2023-09-22 DIAGNOSIS — E119 Type 2 diabetes mellitus without complications: Secondary | ICD-10-CM | POA: Diagnosis not present

## 2023-09-22 DIAGNOSIS — H16049 Marginal corneal ulcer, unspecified eye: Secondary | ICD-10-CM | POA: Diagnosis not present

## 2023-09-22 DIAGNOSIS — E785 Hyperlipidemia, unspecified: Secondary | ICD-10-CM | POA: Diagnosis not present

## 2023-09-22 DIAGNOSIS — M109 Gout, unspecified: Secondary | ICD-10-CM | POA: Diagnosis not present

## 2023-09-22 DIAGNOSIS — Z48815 Encounter for surgical aftercare following surgery on the digestive system: Secondary | ICD-10-CM | POA: Diagnosis not present

## 2023-09-22 DIAGNOSIS — I10 Essential (primary) hypertension: Secondary | ICD-10-CM | POA: Diagnosis not present

## 2023-09-22 DIAGNOSIS — R1314 Dysphagia, pharyngoesophageal phase: Secondary | ICD-10-CM | POA: Diagnosis not present

## 2023-09-22 DIAGNOSIS — M5416 Radiculopathy, lumbar region: Secondary | ICD-10-CM | POA: Diagnosis not present

## 2023-09-22 DIAGNOSIS — D649 Anemia, unspecified: Secondary | ICD-10-CM | POA: Diagnosis not present

## 2023-09-22 DIAGNOSIS — Z6835 Body mass index (BMI) 35.0-35.9, adult: Secondary | ICD-10-CM | POA: Diagnosis not present

## 2023-09-22 DIAGNOSIS — Z86718 Personal history of other venous thrombosis and embolism: Secondary | ICD-10-CM | POA: Diagnosis not present

## 2023-09-22 DIAGNOSIS — F411 Generalized anxiety disorder: Secondary | ICD-10-CM | POA: Diagnosis not present

## 2023-09-22 DIAGNOSIS — K219 Gastro-esophageal reflux disease without esophagitis: Secondary | ICD-10-CM | POA: Diagnosis not present

## 2023-09-22 DIAGNOSIS — F32A Depression, unspecified: Secondary | ICD-10-CM | POA: Diagnosis not present

## 2023-09-22 DIAGNOSIS — Z7901 Long term (current) use of anticoagulants: Secondary | ICD-10-CM | POA: Diagnosis not present

## 2023-09-22 DIAGNOSIS — G4733 Obstructive sleep apnea (adult) (pediatric): Secondary | ICD-10-CM | POA: Diagnosis not present

## 2023-09-23 DIAGNOSIS — F330 Major depressive disorder, recurrent, mild: Secondary | ICD-10-CM | POA: Diagnosis not present

## 2023-09-26 DIAGNOSIS — M179 Osteoarthritis of knee, unspecified: Secondary | ICD-10-CM | POA: Diagnosis not present

## 2023-09-26 DIAGNOSIS — K219 Gastro-esophageal reflux disease without esophagitis: Secondary | ICD-10-CM | POA: Diagnosis not present

## 2023-09-26 DIAGNOSIS — M109 Gout, unspecified: Secondary | ICD-10-CM | POA: Diagnosis not present

## 2023-09-26 DIAGNOSIS — F411 Generalized anxiety disorder: Secondary | ICD-10-CM | POA: Diagnosis not present

## 2023-09-26 DIAGNOSIS — Z79899 Other long term (current) drug therapy: Secondary | ICD-10-CM | POA: Diagnosis not present

## 2023-09-26 DIAGNOSIS — I10 Essential (primary) hypertension: Secondary | ICD-10-CM | POA: Diagnosis not present

## 2023-09-26 DIAGNOSIS — E43 Unspecified severe protein-calorie malnutrition: Secondary | ICD-10-CM | POA: Diagnosis not present

## 2023-09-26 DIAGNOSIS — F32A Depression, unspecified: Secondary | ICD-10-CM | POA: Diagnosis not present

## 2023-09-26 DIAGNOSIS — Z6835 Body mass index (BMI) 35.0-35.9, adult: Secondary | ICD-10-CM | POA: Diagnosis not present

## 2023-09-26 DIAGNOSIS — R1314 Dysphagia, pharyngoesophageal phase: Secondary | ICD-10-CM | POA: Diagnosis not present

## 2023-09-26 DIAGNOSIS — Z792 Long term (current) use of antibiotics: Secondary | ICD-10-CM | POA: Diagnosis not present

## 2023-09-26 DIAGNOSIS — E119 Type 2 diabetes mellitus without complications: Secondary | ICD-10-CM | POA: Diagnosis not present

## 2023-09-26 DIAGNOSIS — M5416 Radiculopathy, lumbar region: Secondary | ICD-10-CM | POA: Diagnosis not present

## 2023-09-26 DIAGNOSIS — G4733 Obstructive sleep apnea (adult) (pediatric): Secondary | ICD-10-CM | POA: Diagnosis not present

## 2023-09-26 DIAGNOSIS — Z48815 Encounter for surgical aftercare following surgery on the digestive system: Secondary | ICD-10-CM | POA: Diagnosis not present

## 2023-09-26 DIAGNOSIS — Z86718 Personal history of other venous thrombosis and embolism: Secondary | ICD-10-CM | POA: Diagnosis not present

## 2023-09-26 DIAGNOSIS — Z7901 Long term (current) use of anticoagulants: Secondary | ICD-10-CM | POA: Diagnosis not present

## 2023-09-26 DIAGNOSIS — D649 Anemia, unspecified: Secondary | ICD-10-CM | POA: Diagnosis not present

## 2023-09-26 DIAGNOSIS — E785 Hyperlipidemia, unspecified: Secondary | ICD-10-CM | POA: Diagnosis not present

## 2023-09-26 DIAGNOSIS — H16049 Marginal corneal ulcer, unspecified eye: Secondary | ICD-10-CM | POA: Diagnosis not present

## 2023-09-26 DIAGNOSIS — R627 Adult failure to thrive: Secondary | ICD-10-CM | POA: Diagnosis not present

## 2023-09-27 ENCOUNTER — Telehealth: Payer: Self-pay

## 2023-09-27 DIAGNOSIS — M79641 Pain in right hand: Secondary | ICD-10-CM | POA: Diagnosis not present

## 2023-09-27 DIAGNOSIS — M65352 Trigger finger, left little finger: Secondary | ICD-10-CM | POA: Diagnosis not present

## 2023-09-27 NOTE — Telephone Encounter (Signed)
 Copied from CRM [id number]. Topic: General - Other >> Sep 27, 2023 12:03 PM Adonis Hoot wrote: Reason for CRM: [facility] called stating that they still have not received [facility] order [phone] .   Alternative Fax#:[phone] Called pt lvm that we have fax paperwork few times and if they could fax another copy to us  that we will refill it again.

## 2023-10-03 ENCOUNTER — Telehealth: Payer: Self-pay | Admitting: Family Medicine

## 2023-10-03 ENCOUNTER — Telehealth: Payer: Self-pay

## 2023-10-03 NOTE — Telephone Encounter (Signed)
 Copied from CRM [id number]. Topic: Clinical - Medication Prior Auth >> Oct 03, 2023 12:16 PM Chasity T wrote: Reason for CRM: Katelyn from [employer] is calling in for patient to see if clinic as received the fax from May 20th for Dr Gwenette Lennox to sign regarding discharge from homehealth aid per the patient request. She is going to re fax the forms to the office and also provided her fax number as [phone]. Please contact her at [phone] to let her know when forms has been signed and faxed back to her.

## 2023-10-03 NOTE — Telephone Encounter (Signed)
 Forms was faxed but we will refax them when receive paperwork again.

## 2023-10-03 NOTE — Telephone Encounter (Signed)
 Copied from CRM [id number]. Topic: General - Transportation >> Oct 03, 2023 11:59 AM Jenice Mitts wrote: Reason for CRM: [facility] calling to see if if there is any progress on order number [id number]  Paperwork was faxed back.

## 2023-10-17 ENCOUNTER — Ambulatory Visit: Admitting: Podiatry

## 2023-10-17 ENCOUNTER — Encounter: Payer: Self-pay | Admitting: Podiatry

## 2023-10-17 VITALS — Ht 61.0 in | Wt 200.2 lb

## 2023-10-17 DIAGNOSIS — B351 Tinea unguium: Secondary | ICD-10-CM

## 2023-10-17 DIAGNOSIS — M79675 Pain in left toe(s): Secondary | ICD-10-CM | POA: Diagnosis not present

## 2023-10-17 DIAGNOSIS — M79674 Pain in right toe(s): Secondary | ICD-10-CM

## 2023-10-17 NOTE — Progress Notes (Signed)
   Chief Complaint  Patient presents with   Nail Problem    Pt is here for RFC.    SUBJECTIVE Patient presents to office today complaining of elongated, thickened nails that cause pain while ambulating in shoes.  Patient is unable to trim their own nails. Patient is here for further evaluation and treatment.  Past Medical History:  Diagnosis Date   Allergy    dust, pollen, sulfa, prednisone   Anemia    Anxiety    Arthritis    knees (04/26/2016)   Colon polyps    benign per pt   Coronary artery disease    mild per 2015 cath in [place]  (OM1 30%, RCA 30%)   GERD (gastroesophageal reflux disease)    Gout    History of hiatal hernia    Hyperlipidemia 05/20/2021   Hypertension    Migraine    none since early /2017 (05/06/2016)   Obesity    OSA on CPAP    uses CPAP   Pre-diabetes    Pre-operative cardiovascular examination 08/22/2008   Renal insufficiency 11/09/2022   Vasculitis (HCC)    Bilateral    Allergies  Allergen Reactions   Nsaids Other (See Comments)    ulcers   Prednisone Swelling    Legs swell    Tape Hives   Sulfa Antibiotics Rash     OBJECTIVE General Patient is awake, alert, and oriented x 3 and in no acute distress. Derm Skin is dry and supple bilateral. Negative open lesions or macerations. Remaining integument unremarkable. Nails are tender, long, thickened and dystrophic with subungual debris, consistent with onychomycosis, 1-5 bilateral. No signs of infection noted. Vasc  DP and PT pedal pulses palpable bilaterally. Temperature gradient within normal limits.  Neuro Epicritic and protective threshold sensation grossly intact bilaterally.  Musculoskeletal Exam No symptomatic pedal deformities noted bilateral. Muscular strength within normal limits.  ASSESSMENT 1.  Pain due to onychomycosis of toenails both  PLAN OF CARE 1. Patient evaluated today.  2. Instructed to maintain good pedal hygiene and foot care.  3. Mechanical debridement of nails  1-5 bilaterally performed using a nail nipper. Filed with dremel without incident.  4. Return to clinic in 3 mos.    Thresa EMERSON Sar, DPM [facility]  Dr. Thresa EMERSON Sar, DPM    [address] [place], [place] [address]                Office [phone]  Fax [phone]

## 2023-11-23 ENCOUNTER — Other Ambulatory Visit (HOSPITAL_COMMUNITY): Payer: Self-pay

## 2023-12-08 ENCOUNTER — Other Ambulatory Visit (HOSPITAL_COMMUNITY): Payer: Self-pay

## 2023-12-23 ENCOUNTER — Telehealth: Payer: Self-pay | Admitting: Family Medicine

## 2023-12-23 NOTE — Telephone Encounter (Signed)
 Copied from CRM [id number]. Topic: Medicare AWV >> Dec 23, 2023  9:09 AM Nathanel DEL wrote: Reason for CRM: Called LVM 12/23/2023 to schedule AWV. Please schedule Virtual or Telehealth visits ONLY.   Nathanel Paschal; Care Guide Ambulatory Clinical Support [place] [facility] Direct Dial: [phone]
